# Patient Record
Sex: Male | Born: 1985
Health system: Southern US, Community
[De-identification: ages and names within clinical notes are randomized; demographics above are authoritative.]

## PROBLEM LIST (undated history)

## (undated) DIAGNOSIS — A039 Shigellosis, unspecified: Secondary | ICD-10-CM

## (undated) DIAGNOSIS — B20 Human immunodeficiency virus [HIV] disease: Secondary | ICD-10-CM

## (undated) DIAGNOSIS — I639 Cerebral infarction, unspecified: Secondary | ICD-10-CM

## (undated) DIAGNOSIS — A539 Syphilis, unspecified: Secondary | ICD-10-CM

## (undated) DIAGNOSIS — Z880 Allergy status to penicillin: Secondary | ICD-10-CM

## (undated) DIAGNOSIS — K219 Gastro-esophageal reflux disease without esophagitis: Secondary | ICD-10-CM

## (undated) DIAGNOSIS — J189 Pneumonia, unspecified organism: Secondary | ICD-10-CM

## (undated) DIAGNOSIS — M1990 Unspecified osteoarthritis, unspecified site: Secondary | ICD-10-CM

## (undated) DIAGNOSIS — Z1612 Extended spectrum beta lactamase (ESBL) resistance: Secondary | ICD-10-CM

## (undated) DIAGNOSIS — A499 Bacterial infection, unspecified: Secondary | ICD-10-CM

## (undated) DIAGNOSIS — B04 Monkeypox: Secondary | ICD-10-CM

## (undated) DIAGNOSIS — A072 Cryptosporidiosis: Secondary | ICD-10-CM

## (undated) DIAGNOSIS — Z8619 Personal history of other infectious and parasitic diseases: Secondary | ICD-10-CM

## (undated) DIAGNOSIS — K6282 Dysplasia of anus: Secondary | ICD-10-CM

## (undated) HISTORY — DX: Monkeypox: B04

## (undated) HISTORY — DX: Cryptosporidiosis: A07.2

## (undated) HISTORY — DX: Syphilis, unspecified: A53.9

## (undated) HISTORY — DX: Dysplasia of anus: K62.82

## (undated) HISTORY — DX: Shigellosis, unspecified: A03.9

## (undated) HISTORY — DX: Personal history of other infectious and parasitic diseases: Z86.19

## (undated) HISTORY — DX: Allergy status to penicillin: Z88.0

## (undated) HISTORY — DX: Pneumonia, unspecified organism: J18.9

---

## 2011-06-10 ENCOUNTER — Emergency Department: Payer: Self-pay | Admitting: Emergency Medicine

## 2011-07-15 ENCOUNTER — Emergency Department: Payer: Self-pay | Admitting: *Deleted

## 2011-09-27 ENCOUNTER — Inpatient Hospital Stay: Payer: Self-pay | Admitting: *Deleted

## 2011-09-27 LAB — COMPREHENSIVE METABOLIC PANEL
Albumin: 3.3 g/dL — ABNORMAL LOW (ref 3.4–5.0)
Anion Gap: 14 (ref 7–16)
BUN: 11 mg/dL (ref 7–18)
Calcium, Total: 8.6 mg/dL (ref 8.5–10.1)
Co2: 27 mmol/L (ref 21–32)
Creatinine: 0.84 mg/dL (ref 0.60–1.30)
EGFR (African American): >60
EGFR (Non-African Amer.): >60
Glucose: 84 mg/dL (ref 65–99)
Osmolality: 284 (ref 275–301)
Potassium: 3.4 mmol/L — ABNORMAL LOW (ref 3.5–5.1)
SGOT(AST): 32 U/L (ref 15–37)
SGPT (ALT): 20 U/L
Sodium: 143 mmol/L (ref 136–145)

## 2011-09-27 LAB — CBC WITH DIFFERENTIAL/PLATELET
Basophil #: 0 10*3/uL (ref 0.0–0.1)
Basophil %: 0.5 %
Eosinophil #: 0 10*3/uL (ref 0.0–0.7)
Eosinophil %: 0.9 %
HCT: 41.3 % (ref 40.0–52.0)
HCT: 37.9 % — ABNORMAL LOW (ref 40.0–52.0)
HGB: 13.5 g/dL (ref 13.0–18.0)
Lymphocyte #: 1.3 10*3/uL (ref 1.0–3.6)
MCH: 34.1 pg — ABNORMAL HIGH (ref 26.0–34.0)
MCH: 36.1 pg — ABNORMAL HIGH (ref 26.0–34.0)
MCHC: 34.5 g/dL (ref 32.0–36.0)
MCHC: 35.6 g/dL (ref 32.0–36.0)
MCV: 99 fL (ref 80–100)
MCV: 101 fL — ABNORMAL HIGH (ref 80–100)
Monocyte #: 0.4 10*3/uL (ref 0.0–0.7)
Monocyte #: 0.5 10*3/uL (ref 0.0–0.7)
Neutrophil #: 1.6 10*3/uL (ref 1.4–6.5)
Neutrophil #: 1.4 10*3/uL (ref 1.4–6.5)
Neutrophil %: 44 %
Platelet: 125 10*3/uL — ABNORMAL LOW (ref 150–440)
RBC: 4.18 10*6/uL — ABNORMAL LOW (ref 4.40–5.90)
RBC: 3.73 10*6/uL — ABNORMAL LOW (ref 4.40–5.90)
RDW: 13.8 % (ref 11.5–14.5)

## 2011-09-27 LAB — GLUCOSE, CSF: Glucose, CSF: 43 mg/dL (ref 40–75)

## 2011-09-27 LAB — CSF CELL COUNT WITH DIFFERENTIAL
CSF Tube #: 3
Eosinophil: 0 %
Lymphocytes: 96 %
Monocytes/Macrophages: 4 %
Neutrophils: 0 %
Other Cells: 0 %
WBC (CSF): 87 /mm3

## 2011-09-27 LAB — BASIC METABOLIC PANEL
Anion Gap: 9 (ref 7–16)
BUN: 12 mg/dL (ref 7–18)
Co2: 28 mmol/L (ref 21–32)
Creatinine: 1.02 mg/dL (ref 0.60–1.30)
EGFR (African American): >60
Glucose: 99 mg/dL (ref 65–99)
Osmolality: 281 (ref 275–301)
Potassium: 3.2 mmol/L — ABNORMAL LOW (ref 3.5–5.1)

## 2011-09-27 LAB — LIPASE, BLOOD: Lipase: 162 U/L (ref 73–393)

## 2011-09-27 LAB — PROTEIN, CSF: Protein, CSF: 94 mg/dL — ABNORMAL HIGH (ref 15–45)

## 2011-09-29 LAB — AEROBIC CULTURE

## 2011-10-02 LAB — CULTURE, BLOOD (SINGLE)

## 2012-07-03 DIAGNOSIS — Z880 Allergy status to penicillin: Secondary | ICD-10-CM

## 2012-07-03 HISTORY — DX: Allergy status to penicillin: Z88.0

## 2012-07-26 DIAGNOSIS — K6282 Dysplasia of anus: Secondary | ICD-10-CM

## 2012-07-26 HISTORY — DX: Dysplasia of anus: K62.82

## 2013-07-27 ENCOUNTER — Emergency Department: Payer: Self-pay | Admitting: Emergency Medicine

## 2013-07-27 LAB — URINALYSIS, COMPLETE
BACTERIA: NONE SEEN
BILIRUBIN, UR: NEGATIVE
Glucose,UR: NEGATIVE mg/dL (ref 0–75)
Leukocyte Esterase: NEGATIVE
NITRITE: NEGATIVE
PROTEIN: NEGATIVE
Ph: 6 (ref 4.5–8.0)
RBC,UR: 35 /HPF (ref 0–5)
Specific Gravity: 1.026 (ref 1.003–1.030)
Squamous Epithelial: 1
WBC UR: 3 /HPF (ref 0–5)

## 2013-12-03 ENCOUNTER — Telehealth: Payer: Self-pay

## 2013-12-03 NOTE — Telephone Encounter (Signed)
Call from FordsMichelle , NP with [facility] regarding assisting patient with HIV medications.   Patient will need to schedule intake and see financial counselor. Medical records have been received. Apportionment given for Dec 17, 2013 @ 9:00 a.m.  Laurell Josephsammy K Jayshon Dommer, RN

## 2013-12-08 ENCOUNTER — Encounter (HOSPITAL_COMMUNITY): Payer: Self-pay | Admitting: Emergency Medicine

## 2013-12-08 ENCOUNTER — Emergency Department (HOSPITAL_COMMUNITY)
Admission: EM | Admit: 2013-12-08 | Discharge: 2013-12-08 | Disposition: A | Discharge status: Home / Self Care | Payer: No Typology Code available for payment source | Attending: Emergency Medicine | Admitting: Emergency Medicine

## 2013-12-08 DIAGNOSIS — Z792 Long term (current) use of antibiotics: Secondary | ICD-10-CM | POA: Insufficient documentation

## 2013-12-08 DIAGNOSIS — K0889 Other specified disorders of teeth and supporting structures: Secondary | ICD-10-CM

## 2013-12-08 DIAGNOSIS — Z880 Allergy status to penicillin: Secondary | ICD-10-CM | POA: Insufficient documentation

## 2013-12-08 DIAGNOSIS — K044 Acute apical periodontitis of pulpal origin: Secondary | ICD-10-CM | POA: Insufficient documentation

## 2013-12-08 DIAGNOSIS — F17200 Nicotine dependence, unspecified, uncomplicated: Secondary | ICD-10-CM | POA: Insufficient documentation

## 2013-12-08 DIAGNOSIS — K089 Disorder of teeth and supporting structures, unspecified: Secondary | ICD-10-CM | POA: Insufficient documentation

## 2013-12-08 DIAGNOSIS — Z21 Asymptomatic human immunodeficiency virus [HIV] infection status: Secondary | ICD-10-CM | POA: Insufficient documentation

## 2013-12-08 DIAGNOSIS — K047 Periapical abscess without sinus: Secondary | ICD-10-CM

## 2013-12-08 DIAGNOSIS — K0510 Chronic gingivitis, plaque induced: Secondary | ICD-10-CM

## 2013-12-08 DIAGNOSIS — K0381 Cracked tooth: Secondary | ICD-10-CM | POA: Insufficient documentation

## 2013-12-08 HISTORY — DX: Human immunodeficiency virus (HIV) disease: B20

## 2013-12-08 MED ORDER — HYDROCODONE-ACETAMINOPHEN 5-325 MG PO TABS: 1.0000 | ORAL_TABLET | Freq: Four times a day (QID) | ORAL | Status: DC | PRN

## 2013-12-08 MED ORDER — IBUPROFEN 800 MG PO TABS
800.0000 mg | ORAL_TABLET | Freq: Once | ORAL | Status: AC
  Administered 2013-12-08: 800 mg via ORAL
  Filled 2013-12-08: qty 1

## 2013-12-08 MED ORDER — CLINDAMYCIN HCL 150 MG PO CAPS: ORAL_CAPSULE | ORAL | Status: DC

## 2013-12-08 MED ORDER — IBUPROFEN 800 MG PO TABS: 800.0000 mg | ORAL_TABLET | Freq: Three times a day (TID) | ORAL | Status: DC | PRN

## 2013-12-08 MED ORDER — HYDROCODONE-ACETAMINOPHEN 5-325 MG PO TABS
1.0000 | ORAL_TABLET | Freq: Once | ORAL | Status: AC
  Administered 2013-12-08: 1 via ORAL
  Filled 2013-12-08: qty 1

## 2013-12-08 NOTE — Discharge Instructions (Signed)
Return here as needed.  Followup with the dentist provided. rinse with warm water and peroxide 3 times a day

## 2013-12-08 NOTE — ED Provider Notes (Signed)
Medical screening examination/treatment/procedure(s) were performed by non-physician practitioner and as supervising physician I was immediately available for consultation/collaboration.   EKG Interpretation None       Sunnie NielsenBrian Maleia Weems, MD 12/08/13 2259

## 2013-12-08 NOTE — ED Provider Notes (Signed)
CSN: [id number]     Arrival date & time 12/08/13  0540 History   First MD Initiated Contact with Patient 12/08/13 [phone]     Chief Complaint  Patient presents with  . Dental Pain     (Consider location/radiation/quality/duration/timing/severity/associated sxs/prior Treatment) HPI Patient presents to the emergency department with gum and dental pain that has been ongoing for the last 2 weeks.  Patient, states, that he has not taken any medications prior to arrival.  Patient, states, that he feels, like he has swollen lymph nodes.  He states his left upper and lower teeth and gumline are irritated and inflamed.  The patient, states, that he has a cracked tooth in the left upper dentition.  Patient denies fevers, neck pain, neck swelling, shortness of breath, nausea, vomiting, diarrhea, weakness, dizziness, or syncope.  The patient, states, that symptoms have been persistent.  He states nothing seems make his condition, better or worse Past Medical History  Diagnosis Date  . HIV disease    History reviewed. No pertinent past surgical history. No family history on file. History  Substance Use Topics  . Smoking status: Current Every Day Smoker -- 1.00 packs/day  . Smokeless tobacco: Never Used  . Alcohol Use: Yes     Comment: whiskey occasionally     Review of Systems  All other systems negative except as documented in the HPI. All pertinent positives and negatives as reviewed in the HPI.  Allergies  Amoxicillin  Home Medications   Prior to Admission medications   Medication Sig Start Date End Date Taking? Authorizing Provider  ibuprofen (ADVIL,MOTRIN) 800 MG tablet Take 800 mg by mouth every 8 (eight) hours as needed for moderate pain.   Yes Historical Provider, MD  sulfamethoxazole-trimethoprim (BACTRIM DS,SEPTRA DS) 800-160 MG per tablet Take 0.5 tablets by mouth daily.   Yes Historical Provider, MD   BP 128/77  Pulse 68  Temp(Src) 98.2 F (36.8 C) (Oral)  Resp 20  SpO2  98% Physical Exam  Nursing note and vitals reviewed. Constitutional: He is oriented to person, place, and time. He appears well-developed and well-nourished.  HENT:  Mouth/Throat: Oropharynx is clear and moist and mucous membranes are normal. He does not have dentures. No oral lesions. No trismus in the jaw. Abnormal dentition. Dental caries present. No dental abscesses or uvula swelling.    Cardiovascular: Normal rate, regular rhythm and normal heart sounds.  Exam reveals no gallop and no friction rub.   No murmur heard. Pulmonary/Chest: Effort normal and breath sounds normal.  Neurological: He is alert and oriented to person, place, and time.  Skin: Skin is warm and dry. No erythema.    ED Course  Procedures (including critical care time)   Patient be referred to the on-call dentist.  Told to return here as needed.  Advised to followup with his primary care Dr. told to rinse with warm water and peroxide 3 times a day  Carlyle DollyChristopher W Makaya Juneau, PA-C 12/08/13 0700

## 2013-12-08 NOTE — ED Notes (Signed)
Pt states for the last week or so he has been experiencing pain and tenderness to his gum line. Pt has several broken teeth in mouth. Pt has been using oragel. Pt has a Rx for 800 mg ibuprofen and bactrim from clinic.

## 2013-12-17 ENCOUNTER — Ambulatory Visit (INDEPENDENT_AMBULATORY_CARE_PROVIDER_SITE_OTHER): Payer: Self-pay

## 2013-12-17 DIAGNOSIS — B20 Human immunodeficiency virus [HIV] disease: Secondary | ICD-10-CM

## 2013-12-17 DIAGNOSIS — Z23 Encounter for immunization: Secondary | ICD-10-CM

## 2013-12-18 LAB — CBC WITH DIFFERENTIAL/PLATELET
Basophils Absolute: 0 10*3/uL (ref 0.0–0.1)
Basophils Relative: 0 % (ref 0–1)
EOS PCT: 2 % (ref 0–5)
Eosinophils Absolute: 0.1 10*3/uL (ref 0.0–0.7)
HEMATOCRIT: 37.2 % — AB (ref 39.0–52.0)
HEMOGLOBIN: 13.1 g/dL (ref 13.0–17.0)
Lymphocytes Relative: 35 % (ref 12–46)
Lymphs Abs: 0.9 10*3/uL (ref 0.7–4.0)
MCH: 33.2 pg (ref 26.0–34.0)
MCHC: 35.2 g/dL (ref 30.0–36.0)
MCV: 94.4 fL (ref 78.0–100.0)
MONOS PCT: 15 % — AB (ref 3–12)
Monocytes Absolute: 0.4 10*3/uL (ref 0.1–1.0)
NEUTROS ABS: 1.3 10*3/uL — AB (ref 1.7–7.7)
Neutrophils Relative %: 48 % (ref 43–77)
Platelets: 161 10*3/uL (ref 150–400)
RBC: 3.94 MIL/uL — ABNORMAL LOW (ref 4.22–5.81)
RDW: 13.8 % (ref 11.5–15.5)
WBC: 2.7 10*3/uL — ABNORMAL LOW (ref 4.0–10.5)

## 2013-12-18 LAB — COMPLETE METABOLIC PANEL WITH GFR
ALBUMIN: 4 g/dL (ref 3.5–5.2)
ALT: 41 U/L (ref 0–53)
AST: 56 U/L — ABNORMAL HIGH (ref 0–37)
Alkaline Phosphatase: 67 U/L (ref 39–117)
BUN: 18 mg/dL (ref 6–23)
CALCIUM: 8.7 mg/dL (ref 8.4–10.5)
CHLORIDE: 105 meq/L (ref 96–112)
CO2: 28 mEq/L (ref 19–32)
CREATININE: 0.66 mg/dL (ref 0.50–1.35)
GFR, Est African American: >89 mL/min
GFR, Est Non African American: >89 mL/min
GLUCOSE: 84 mg/dL (ref 70–99)
POTASSIUM: 3.9 meq/L (ref 3.5–5.3)
Sodium: 141 mEq/L (ref 135–145)
Total Bilirubin: 0.4 mg/dL (ref 0.2–1.2)
Total Protein: 7.2 g/dL (ref 6.0–8.3)

## 2013-12-18 LAB — T-HELPER CELL (CD4) - (RCID CLINIC ONLY)
CD4 % Helper T Cell: 8 % — ABNORMAL LOW (ref 33–55)
CD4 T Cell Abs: 200 /uL — ABNORMAL LOW (ref 400–2700)

## 2013-12-18 LAB — RPR: RPR: REACTIVE — AB

## 2013-12-18 LAB — HEPATITIS B CORE ANTIBODY, TOTAL: Hep B Core Total Ab: REACTIVE — AB

## 2013-12-18 LAB — URINALYSIS
BILIRUBIN URINE: NEGATIVE
GLUCOSE, UA: NEGATIVE mg/dL
Hgb urine dipstick: NEGATIVE
Ketones, ur: NEGATIVE mg/dL
Leukocytes, UA: NEGATIVE
Nitrite: NEGATIVE
Protein, ur: 30 mg/dL — AB
Urobilinogen, UA: 1 mg/dL (ref 0.0–1.0)
pH: 6 (ref 5.0–8.0)

## 2013-12-18 LAB — LIPID PANEL
CHOL/HDL RATIO: 2.7 ratio
CHOLESTEROL: 126 mg/dL (ref 0–200)
HDL: 47 mg/dL (ref >39)
LDL Cholesterol: 49 mg/dL (ref 0–99)
TRIGLYCERIDES: 152 mg/dL — AB (ref <150)
VLDL: 30 mg/dL (ref 0–40)

## 2013-12-18 LAB — HIV-1 RNA ULTRAQUANT REFLEX TO GENTYP+
HIV 1 RNA Quant: 1058016 copies/mL — ABNORMAL HIGH (ref <20)
HIV-1 RNA Quant, Log: 6.02 {Log} — ABNORMAL HIGH (ref <1.30)

## 2013-12-18 LAB — HEPATITIS B SURFACE ANTIBODY,QUALITATIVE

## 2013-12-18 LAB — HEPATITIS B SURFACE ANTIGEN: Hepatitis B Surface Ag: NEGATIVE

## 2013-12-18 LAB — T.PALLIDUM AB, TOTAL

## 2013-12-18 LAB — HEPATITIS A ANTIBODY, TOTAL: Hep A Total Ab: NONREACTIVE

## 2013-12-18 LAB — HEPATITIS C ANTIBODY: HCV Ab: NEGATIVE

## 2013-12-18 LAB — RPR TITER: RPR Titer: 1:2 {titer}

## 2013-12-23 NOTE — Progress Notes (Signed)
Patient recently transferred from [facility]. Diagnosed in 2012. He has been without Stribild, Prezista and Bactrim for at least one month while looking for resources.  He is currently living at a homeless shelter.  He is a bisexual single male  with preference for male sexual partners and versatile for insertive and receptive sex. Currently he has no support and has refused counseling or group support offered.  Past history of cocaine and alcohol abuse.   He is currently being treated with antibiotics for a infected tooth. He attends  [facility] for primary care.  Three tattoos performed at a tattoo party with bilateral ear piercings.  Medical records received. Vaccines updated  Laurell Josephs, RN

## 2013-12-24 LAB — HLA B*5701: HLA-B*5701 w/rflx HLA-B High: NEGATIVE

## 2013-12-28 LAB — HIV-1 GENOTYPR PLUS

## 2014-01-01 ENCOUNTER — Ambulatory Visit (INDEPENDENT_AMBULATORY_CARE_PROVIDER_SITE_OTHER): Payer: No Typology Code available for payment source | Admitting: Internal Medicine

## 2014-01-01 ENCOUNTER — Encounter: Payer: Self-pay | Admitting: Internal Medicine

## 2014-01-01 VITALS — BP 115/72 | HR 72 | Temp 98.5°F | Ht 73.0 in | Wt 204.0 lb

## 2014-01-01 DIAGNOSIS — B20 Human immunodeficiency virus [HIV] disease: Secondary | ICD-10-CM | POA: Insufficient documentation

## 2014-01-01 DIAGNOSIS — Z202 Contact with and (suspected) exposure to infections with a predominantly sexual mode of transmission: Secondary | ICD-10-CM

## 2014-01-01 DIAGNOSIS — K6282 Dysplasia of anus: Secondary | ICD-10-CM

## 2014-01-01 DIAGNOSIS — F17200 Nicotine dependence, unspecified, uncomplicated: Secondary | ICD-10-CM

## 2014-01-01 MED ORDER — SULFAMETHOXAZOLE-TMP DS 800-160 MG PO TABS
1.0000 | ORAL_TABLET | Freq: Every day | ORAL | Status: DC
Start: 2014-01-01 — End: 2014-02-06

## 2014-01-01 MED ORDER — ELVITEG-COBIC-EMTRICIT-TENOFDF 150-150-200-300 MG PO TABS: 1.0000 | ORAL_TABLET | Freq: Every day | ORAL | Status: DC

## 2014-01-01 MED ORDER — DARUNAVIR ETHANOLATE 800 MG PO TABS: 800.0000 mg | ORAL_TABLET | Freq: Every day | ORAL | Status: DC

## 2014-01-02 ENCOUNTER — Encounter: Payer: Self-pay | Admitting: Internal Medicine

## 2014-01-02 DIAGNOSIS — K6282 Dysplasia of anus: Secondary | ICD-10-CM | POA: Insufficient documentation

## 2014-01-02 DIAGNOSIS — F17200 Nicotine dependence, unspecified, uncomplicated: Secondary | ICD-10-CM | POA: Insufficient documentation

## 2014-01-02 DIAGNOSIS — Z202 Contact with and (suspected) exposure to infections with a predominantly sexual mode of transmission: Secondary | ICD-10-CM | POA: Insufficient documentation

## 2014-01-02 NOTE — Assessment & Plan Note (Signed)
Will send for anal pap.

## 2014-01-02 NOTE — Assessment & Plan Note (Signed)
Will restart Stribild and Prezista.  Counseled on compliance and hope that no new resistance developed.  Genotype here with 103N, previous 184 archived.     Will have him return in about 3 weeks to see if he is on meds. Will start Bactrim for CD4 of 200.  If goes up next time will stop.  Will need Hep A once CD4 remains over 200.

## 2014-01-02 NOTE — Progress Notes (Addendum)
   Subjective:    Patient ID: Jacob Gutierrez, male    DOB: 09-07-1985, 28 y.o.   MRN: [id number]  HPI Here to establish care for HIV.  Diagnosed in 2013, CD4 nadir of 60.  History of syphilis and treated for neurosyphilis in 2013.  History of gonorrhea, pulmonary coccidio. Was started on Atripla in 2013, had poor adherence and developed 103n and 184V resistance mutations.  ARVs stopped due to lack of commitment to compliance.  Then started in 2014 on Stribild with Prezista and was taking daily for about 6 months.  Relocated here from [facility] and continued to get medicines by mail until he didn't follow up there.  Once he was out of medications he presented here to establish care.  Has been off since April.  No new issues.  Interested in theapy and feels he is in a good position to take daily.  No weight loss.  Had a period of diarrhea for about 2 weeks that resolved.   1 hour late for the appt.    Review of Systems  Constitutional: Negative for fever, activity change, appetite change, fatigue and unexpected weight change.  HENT: Negative for trouble swallowing.   Eyes: Negative for visual disturbance.  Respiratory: Negative for cough and shortness of breath.   Gastrointestinal: Negative for nausea and diarrhea.  Skin: Negative for rash.  Neurological: Negative for dizziness, light-headedness and headaches.  Hematological: Negative for adenopathy.  Psychiatric/Behavioral: Negative for sleep disturbance.       Objective:   Physical Exam  Constitutional: He appears well-developed and well-nourished. No distress.  HENT:  Mouth/Throat: No oropharyngeal exudate.  Eyes: Right eye exhibits no discharge. Left eye exhibits no discharge. No scleral icterus.  Cardiovascular: Normal rate, regular rhythm and normal heart sounds.   No murmur heard. Pulmonary/Chest: Effort normal and breath sounds normal. No respiratory distress. He has no wheezes.  Abdominal: Soft. Bowel sounds are normal. He  exhibits no distension. There is no tenderness.  Musculoskeletal: He exhibits no edema.  Lymphadenopathy:    He has no cervical adenopathy.  Skin: Skin is warm and dry. No rash noted.          Assessment & Plan:

## 2014-01-03 ENCOUNTER — Other Ambulatory Visit: Payer: Self-pay | Admitting: Licensed Clinical Social Worker

## 2014-01-03 MED ORDER — ELVITEG-COBIC-EMTRICIT-TENOFDF 150-150-200-300 MG PO TABS: 1.0000 | ORAL_TABLET | Freq: Every day | ORAL | Status: DC

## 2014-01-22 ENCOUNTER — Ambulatory Visit: Payer: No Typology Code available for payment source | Admitting: Internal Medicine

## 2014-02-06 ENCOUNTER — Other Ambulatory Visit: Payer: Self-pay | Admitting: *Deleted

## 2014-02-06 MED ORDER — SULFAMETHOXAZOLE-TMP DS 800-160 MG PO TABS: 1.0000 | ORAL_TABLET | Freq: Every day | ORAL | Status: DC

## 2014-02-06 MED ORDER — DARUNAVIR ETHANOLATE 800 MG PO TABS: 800.0000 mg | ORAL_TABLET | Freq: Every day | ORAL | Status: DC

## 2014-02-06 MED ORDER — ELVITEG-COBIC-EMTRICIT-TENOFDF 150-150-200-300 MG PO TABS: 1.0000 | ORAL_TABLET | Freq: Every day | ORAL | Status: DC

## 2014-02-25 ENCOUNTER — Ambulatory Visit (INDEPENDENT_AMBULATORY_CARE_PROVIDER_SITE_OTHER): Payer: No Typology Code available for payment source | Admitting: Internal Medicine

## 2014-02-25 ENCOUNTER — Encounter: Payer: Self-pay | Admitting: Internal Medicine

## 2014-02-25 VITALS — BP 116/76 | HR 90 | Temp 98.1°F | Ht 73.0 in | Wt 205.0 lb

## 2014-02-25 DIAGNOSIS — B20 Human immunodeficiency virus [HIV] disease: Secondary | ICD-10-CM

## 2014-02-25 DIAGNOSIS — Z23 Encounter for immunization: Secondary | ICD-10-CM

## 2014-02-25 NOTE — Addendum Note (Signed)
Addended by: Wendall MolaOCKERHAM, JACQUELINE A on: 02/25/2014 03:11 PM   Modules accepted: Orders

## 2014-02-25 NOTE — Progress Notes (Signed)
   Subjective:    Patient ID: Shepard GeneralDesmond Gutierrez, male    DOB: 04-12-1986, 28 y.o.   MRN: [id number]  HPI  Here to establish care for HIV.  Diagnosed in 2013, CD4 nadir of 60.  History of syphilis and treated for neurosyphilis in 2013.  History of gonorrhea, pulmonary coccidio. Was started on Atripla in 2013, had poor adherence and developed 103n and 184V resistance mutations.  ARVs stopped due to lack of commitment to compliance.  Then started in 2014 on Stribild with Prezista and was taking daily for about 6 months.  [place] [facility] and continued to get medicines by mail until he didn't follow up there.  Once he was out of medications he presented here to establish care.  Had been off since April.  No new issues.  Started back on Stribild and Prezista after establishing with me last month.  Has been on it about 2 weeks.  No new issues, no n/v/d.  No weight loss.    Review of Systems  Constitutional: Negative for fever, activity change, appetite change, fatigue and unexpected weight change.  HENT: Negative for trouble swallowing.   Gastrointestinal: Negative for nausea and diarrhea.  Skin: Negative for rash.  Neurological: Negative for dizziness.  Hematological: Negative for adenopathy.       Objective:   Physical Exam  Constitutional: He appears well-developed and well-nourished. No distress.  HENT:  Mouth/Throat: No oropharyngeal exudate.  Eyes: No scleral icterus.  Cardiovascular: Normal rate, regular rhythm and normal heart sounds.   No murmur heard. Pulmonary/Chest: Effort normal and breath sounds normal. No respiratory distress. He has no wheezes.  Lymphadenopathy:    He has no cervical adenopathy.  Skin: Skin is warm and dry. No rash noted.          Assessment & Plan:

## 2014-02-25 NOTE — Assessment & Plan Note (Signed)
Doing well back on his regimen.  Will check labs in about 2 weeks and hopefully becoming suppressed.   Hep A vaccine today, will continue with Bacttrim for now but if remains 200 or above with next labs will stop.   RTC 2 weeks after labs.

## 2014-03-11 ENCOUNTER — Other Ambulatory Visit: Payer: No Typology Code available for payment source

## 2014-04-01 ENCOUNTER — Ambulatory Visit: Payer: No Typology Code available for payment source | Admitting: Internal Medicine

## 2014-04-21 ENCOUNTER — Telehealth: Payer: Self-pay | Admitting: *Deleted

## 2014-04-21 NOTE — Telephone Encounter (Signed)
Received signed ROI requesting last labs and office note from Kerrin Mo at [facility].  Patient has transferred care. Faxed requested information as requested.  Notified Conception Oms.  Andree Coss, RN

## 2014-11-16 NOTE — Discharge Summary (Signed)
PATIENT NAME:  Jacob Gutierrez, Jacob Gutierrez MR#:  [id number] DATE OF BIRTH:  Apr 30, 1986  DATE OF ADMISSION:  09/27/2011 DATE OF DISCHARGE:  09/28/2011  DISCHARGE DIAGNOSES:  1. Aseptic meningitis.  2. History of HIV positive. 3. Latent syphilis.  CONSULT: Infectious Disease, Dr. Leavy CellaBlocker    HOSPITAL COURSE: This is a 29 year old male with history of HIV positive, not on any antiretroviral therapy. He presented with headache and vomiting. He was recently started on doxycycline for latent syphilis by the Health Department. He had a lumbar puncture done in the Emergency Room and his CSF showed about 87 WBCs, 30 RBCs, no neutrophils, 96% lymphocytes, and 4 monocytes consistent with aseptic meningitis. His glucose was 43, protein was elevated at 94 mg/dL. His CSF Gram stain and culture were negative. Blood cultures were negative. When he came in, his white count was 3.3, hemoglobin was 14.3. His white count remained stable in the range of 3.2 to 3.3. His creatinine was normal at 0.8. He was slightly hypokalemic. That was replaced. His LFTs were normal. His lipase was normal. He was admitted as aseptic meningitis. Infectious Disease was consulted. He was continued on doxycycline for his latent syphilis. Dr. Leavy CellaBlocker saw him and said that his lumbar puncture was consistent with aseptic meningitis. Most likely this is viral. Because he was being treated for latent syphilis, he also wanted to rule out any neurosyphilis so a CSF VDRL was added to the CSF fluid and it came back as not reactive today. [facility] and he's going to establish with the ID Clinic there. [facility] so he does not need any opportunistic infection prophylaxis. He received IV hydration here, Tylenol and Motrin p.r.n. for headache.. Continue doxycycline as prescribed by the Health Department and acetaminophen and Tylenol as needed for headache.   CONDITION AT DISCHARGE: Stable. His headache, neck stiffness has  almost resolved. No photophobia. No nausea or vomiting. He is tolerating a regular diet. T-max 98.3, heart rate 98, blood pressure 137/90, saturating 98% on room air. Chest is clear. Heart sounds are regular. Abdomen soft, nontender. Neck is supple.   FOLLOW-UP: The patient should follow-up with [facility].  TIME SPENT WITH DISCHARGE: 35 minutes.   ____________________________ Fredia SorrowAbhinav Lyssa Hackley, MD ag:drc D: 09/28/2011 17:39:43 ET T: 09/29/2011 10:10:24 ET JOB#: [id number]  cc: Fredia SorrowAbhinav Azion Centrella, MD, <Dictator> [facility]  Fredia SorrowABHINAV Kaikoa Magro MD ELECTRONICALLY SIGNED 09/29/2011 16:58

## 2014-11-16 NOTE — H&P (Signed)
PATIENT NAME:  Jacob Gutierrez, Jacob Gutierrez DATE OF BIRTH:  [birth year]  DATE OF ADMISSION:  09/27/2011  PRIMARY CARE PHYSICIAN: None.   CHIEF COMPLAINT: Headache and vomiting today.   HISTORY OF PRESENT ILLNESS: Jacob Gutierrez is a 29 year old African American gentleman with a past medical history of HIV which was diagnosed a couple of years ago, not on any medication, who comes to the Emergency Room with headache, which started around 5:00 p.m. yesterday along with 1 or 2 episodes of vomiting. The patient came to the Emergency Room, underwent lumbar puncture which showed findings suggestive of possible aseptic meningitis. Given his history of HIV, he received IV vancomycin and acyclovir given per Emergency Room physician. He is being admitted for further evaluation and management. The patient during my evaluation he states his headache is better. He denies any blurred vision, double vision, any focal weakness, or any more vomiting. He was eating crackers during my evaluation. Denies any fever or any weight loss.   The patient was recently tested positive for syphilis through the health department. He has been on Doxycycline (which is prescription) and he is supposed to be taking it for 28 days. The patient does not remember the exact dose for his doxycycline. He is currently not on any HIV medications. He is [place]   PAST MEDICAL HISTORY:    1. HIV-positive.  2. Positive test for syphilis, currently on doxycycline. He is supposed to take it for 28 days. This was given through the health department.   FAMILY HISTORY: No family history of HIV.   ALLERGIES: Amoxicillin.   SOCIAL HISTORY: Lives by himself. He is currently unemployed. He lost his job. He smokes about a pack a day. He does "smoke weed" occasionally. Denies much alcohol use.   MEDICATIONS: None.   REVIEW OF SYSTEMS: CONSTITUTIONAL: No fever, fatigue,  weakness. EYES: No blurred or double vision. ENT: No tinnitus or ear pain. Positive for headache. RESPIRATORY: No cough, wheeze, hemoptysis. CARDIOVASCULAR: No chest pain, orthopnea, or edema. GASTROINTESTINAL: No nausea, vomiting, diarrhea, or abdominal pain. GU: No dysuria or hematuria. ENDOCRINE: No polyuria or nocturia. HEMATOLOGY: No anemia or easy bruising. SKIN: No acne or rash. MUSCULOSKELETAL: No arthritis. NEUROLOGIC: No cerebrovascular accident or transient ischemic attack. PSYCH: No anxiety or depression. All other systems reviewed and negative.   PHYSICAL EXAMINATION:  GENERAL: The patient is awake, alert, and oriented x3, not in acute distress.   VITAL SIGNS: Afebrile, pulse 48, blood pressure 133/76, pulse oximetry is 98% on room air.   HEENT: Atraumatic, normocephalic. Pupils are equal, round, and reactive to light and accommodation. Extraocular movements intact. Oral mucosa is moist.   NECK: Supple. No JVD. No carotid bruit.   LUNGS: Clear to auscultation bilaterally. No rales, rhonchi, respiratory distress, or labored breathing.   HEART: Both heart sounds are normal. Rate, rhythm is regular. PMI is not lateralized. Chest nontender.   EXTREMITIES: Good pedal pulses, good femoral pulses. No lower extremity edema.   ABDOMEN: Soft, benign, and nontender. No organomegaly. Positive bowel sounds.   NEUROLOGIC: Grossly intact cranial nerves 2 through 12. No motor or sensory deficits.   PSYCH: The patient is awake, alert, and oriented x3.   LABORATORY, RADIOLOGICAL AND DIAGNOSTIC DATA: Protein CSF 94. Glucose 43. No organism seen on Gram stain. 87 WBCs, 96% lymphocytes. White count 3.3, hemoglobin and hematocrit is 14.3 and 41, platelet count 125. Comprehensive metabolic panel within normal limits except  potassium of 3.4.   ASSESSMENT AND PLAN: 29 year old Jacob Gutierrez with:  1. Aseptic/viral meningitis.  2. Headache, nausea, and vomiting due to #1.  3. HIV positive with unknown  CD4 count.  4. Positive for syphilis, currently being treated through the health department with doxycycline.   PLAN:  1. Admit the patient for observation.  2. Regular diet.  3. IV fluids.  4. We will give symptomatic treatment with p.o. ibuprofen versus Tylenol around-the-clock.  5. I will give the patient doxycycline 100 mg b.i.d. for his positive test on syphilis. The patient does not remember the exact dose, but I will start with 100 mg b.i.d.  6. Infectious disease consultation with Dr. Leavy Cella for need of further empiric antibiotic treatment for aseptic meningitis given history of HIV. I will hold off on giving any further antibiotics at present. He already received acyclovir and vancomycin in the Emergency Room.  7. Further work-up according to the patient's clinical course. The hospital admission plan was discussed with the patient who is agreeable to it.   TIME SPENT: 50 minutes.  ____________________________ Jacob Hail Allena Katz, MD sap:ap D: 09/27/2011 05:18:40 ET T: 09/27/2011 08:29:39 ET JOB#: [id number]  cc: Islam Villescas A. Allena Katz, MD, <Dictator> Willow Ora MD ELECTRONICALLY SIGNED 09/28/2011 6:28

## 2014-11-16 NOTE — Consult Note (Signed)
Impression: 29yo BM w/ h/o HIV infection with unknown CD4 count >200 and, syphilis admitted with aseptic meningitis.  His lumbar puncture is consistent with aseptic meningitis.  While most of the etiologies are viral, other possibilities exist.  He is HIV positive with a recent diagnosis of syphilis.  He is being treated for latent syphilis with doxycycline, but in HIV patients there is a higher possibility of neurosyphilis.  Other etiologies include viruses including HSV.  There is no need for acyclovir as HSV meningitis in the abscence of encephalitis resolves spontaneously. Will add CSF VDRL to his spinal fluid. He has a PCN allergy.  If his VDRL is positive, he will need to be desensitized to PCN and started on IV penicillin.  His allergy was as an infant and he does not know the extent of the reaction.  Ceftriaxone would be an alternative, but I would be concerned about cross reaction with penicillin and that this would not be as well documented a therapy as PCN. No need for isolation. [facility] and has an appointment in the ID clinic there to establish HIV care.  Would have him follow up there upon discharge. Continue the doxycycline for now. Given his recent CD4 count he does not need OI prophylaxis.   Electronic Signatures: Shawnya Mayor, Rosalyn GessMichael E (MD) (Signed on 05-Mar-13 12:43)  Authored   Last Updated: 05-Mar-13 14:57 by Kaoru Benda, Rosalyn GessMichael E (MD)

## 2014-11-16 NOTE — Consult Note (Signed)
PATIENT NAME:  Jacob, Gutierrez Jacob Gutierrez DATE OF BIRTH:  1985/08/26  DATE OF CONSULTATION:  09/27/2011  REFERRING PHYSICIAN:  Alford Highland, MD  CONSULTING PHYSICIAN:  Rosalyn Gess. Kenai Fluegel, MD  REASON FOR CONSULTATION: Aseptic meningitis in an HIV patient.   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 The patient states he was in his usual state of health until he began having severe headaches that involved his entire head. His pain was worse with moving his head. He had problems with photophobia as well. He denies any fevers, chills, or sweats. He has not had any rash or confusion. He did have some nausea and vomiting associated with this. He was brought into the Emergency Room and a lumbar puncture was performed which demonstrated increased leukocytes, most which were lymphocytes. He was given a dose of vancomycin and acyclovir but has not received any further antibiotic therapy for this problem. He has been on doxycycline for a diagnosis of syphilis. He was initially diagnosed in December but did not receive word until approximately two weeks ago. He was [facility] the [facility], and due to his penicillin allergy he was started on doxycycline. He is apparently being treated for latent infection. No lumbar puncture was performed with the diagnosis of syphilis. The patient states that he got tested just for routine sexually transmitted disease check. He had been diagnosed with HIV several years ago. He has not been treated for this. He was seen initially on the day prior to admission at [facility] and had blood drawn. He has an appointment at the beginning of next month in the ID clinic there. He denies any fevers, chills, or sweats.  He has had some neck stiffness  and headache. These are somewhat improved since being in the hospital. His nausea and vomiting have somewhat improved as well. He had no change in his bowels. He has had no genital ulcerations. He denies any swollen glands.   ALLERGIES: Penicillin. He was told he had a penicillin allergy as a child but does not recall what the reaction was.   PAST MEDICAL HISTORY:  1. HIV positive. He states he has been tested several times in the past and has had intermediate results in the past but more recently has been told he is positive. He has never been on therapy for this. He does not know his CD4 count nor his viral load. He had been seen at [facility] the day prior to admission and blood work showed a white count of 3.1, hematocrit of 0.39, platelet count 128, ALC 1.6. A CD4 count was 289. His RPR was positive at 1 to 2.  2. Syphilis. The patient is currently about half way through a 28-day course of doxycycline. He had an RPR the day prior to admission of 1 to 2. It is unclear what his RPR was on initial diagnosis in December.   FAMILY HISTORY: No diabetes or cancers in the family.   SOCIAL HISTORY: The patient lives by himself. He smokes about a pack of cigarettes per day. He smokes marijuana. Very rare alcohol use. No pets at home. No injecting drug use history. He has no history of blood transfusions. He has two tattoos that are old. He is sexually active with men.  CURRENT ANTIBIOTICS: Doxycycline orally.  REVIEW OF SYSTEMS: GENERAL: No fevers, chills, or sweats. Some malaise. HEENT: Positive headaches. Negative sinus congestion, nasal congestion. No sore throat. NECK: Positive stiffness. Positive photophobia. No swollen glands. RESPIRATORY: No cough or shortness of breath. No sputum production. CARDIAC: No chest pains or palpitations. GI:  Positive nausea and vomiting. No abdominal pain, no change in his bowels. GENITOURINARY: No change in his urine. MUSCULOSKELETAL: No complaints. SKIN: No rashes. No  ulcerations. No groin lesions. NEUROLOGIC: No focal weakness. He does have neck stiffness as described above.  PSYCHIATRIC: No complaints.   PHYSICAL EXAMINATION:  VITAL SIGNS: T-max 98.1, pulse 57, blood pressure 125/80, 98% on room air.   GENERAL: 29 year old black man in no acute distress.   HEENT: Normocephalic, atraumatic. Pupils equal, reactive to light. Extraocular motion intact. Sclerae, conjunctivae, and lids are without evidence for emboli or petechiae. Oropharynx shows no erythema or exudate. Teeth and gums are in fair condition.   NECK: Supple. Full range of motion, although he had some minimal discomfort with full forward flexion. No lymphadenopathy. No thyromegaly. Midline trachea.    CHEST: Clear to auscultation bilaterally with good air movement. No focal consolidation.   HEART: Regular rate and rhythm without murmur, rub, or gallop.   ABDOMEN: Soft, nontender, and nondistended. No hepatosplenomegaly. No hernias noted.   EXTREMITIES: No evidence for tenosynovitis.   SKIN: No rashes. No stigmata of endocarditis, specifically no Janeway lesions or Osler nodes.   NEUROLOGIC: The patient is awake and interactive, moving all four extremities.   PSYCHIATRIC: Mood and affect appeared normal.   LABORATORY, DIAGNOSTIC, AND RADIOLOGICAL DATA: BUN 12, creatinine 1.02. LFTs within normal limits. White count of 3.2, hemoglobin 13.5, platelet count 127, ANC 1.4, lumbar puncture showed 30 red cells and 87 white cells per high-power field, 96% were lymphocytes. Glucose was 43 and protein was 94. A Gram stain showed no organisms. No white blood cells and no growth at 8 to 12 hours. A CT scan of the head without contrast showed no acute intracranial process.   IMPRESSION: This is a 29 year old white man with a history of HIV infection with a CD4 count greater than 200 and syphilis who was admitted with aseptic meningitis.   RECOMMENDATIONS:  1. His lumbar puncture is consistent with  aseptic meningitis. While  most of the etiologies are viral, other possibilities exist. He is HIV positive with a recent diagnosis of syphilis. 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 This is typically associated with high RPRs and low CD4 counts, and his RPR was actually fairly low. Other etiologies include viruses such as HSV. There is no need for acyclovir as HSV meningitis in the absence of encephalitis resolves spontaneously.  2. We will add CSF, VDRL to his spinal fluid.  3. He has a penicillin allergy.  If his VDRL is positive he will need to be desensitized to penicillin and started on IV penicillin. His allergy was as an infant and he does not know the extent of the reaction. Ceftriaxone would be an alternative, but I would be concerned about cross-reaction with the penicillin and this would not be as well documented a therapy as penicillin, especially given his HIV status.  4. There is no need for isolation.  5. He was recently seen at [facility] and has an appointment in the ID clinic there to establish HIV care. Would have him follow up there upon discharge.  6. Would continue doxycycline for  now.  7. Given his recent CD4 count he does not need OI prophylaxis.   This is a high-level infectious disease consult. Thank you very much for involving me in Jacob Gutierrez's care.    ____________________________ Rosalyn GessMichael E. Yareni Creps, MD [id number] D: 09/27/2011 14:57:07 ET T: 09/27/2011 16:30:51 ET JOB#: [id number]  cc: Rosalyn GessMichael E. Chris Narasimhan, MD, <Dictator> Zakiya Sporrer E Caster Fayette MD ELECTRONICALLY SIGNED 10/04/2011 10:09

## 2016-09-12 DIAGNOSIS — A539 Syphilis, unspecified: Secondary | ICD-10-CM

## 2016-09-12 HISTORY — DX: Syphilis, unspecified: A53.9

## 2016-11-02 DIAGNOSIS — A071 Giardiasis [lambliasis]: Secondary | ICD-10-CM | POA: Insufficient documentation

## 2017-03-05 ENCOUNTER — Emergency Department (HOSPITAL_COMMUNITY): Payer: Medicaid Other

## 2017-03-05 ENCOUNTER — Encounter (HOSPITAL_COMMUNITY): Payer: Self-pay | Admitting: Emergency Medicine

## 2017-03-05 ENCOUNTER — Inpatient Hospital Stay (HOSPITAL_COMMUNITY)
Admission: EM | Admit: 2017-03-05 | Discharge: 2017-03-08 | DRG: 371 | Disposition: A | Discharge status: Home / Self Care | Payer: Medicaid Other | Attending: Family Medicine | Admitting: Family Medicine

## 2017-03-05 DIAGNOSIS — Z880 Allergy status to penicillin: Secondary | ICD-10-CM

## 2017-03-05 DIAGNOSIS — Z5900 Homelessness unspecified: Secondary | ICD-10-CM | POA: Insufficient documentation

## 2017-03-05 DIAGNOSIS — R634 Abnormal weight loss: Secondary | ICD-10-CM | POA: Diagnosis not present

## 2017-03-05 DIAGNOSIS — R509 Fever, unspecified: Secondary | ICD-10-CM | POA: Diagnosis not present

## 2017-03-05 DIAGNOSIS — A0819 Acute gastroenteropathy due to other small round viruses: Secondary | ICD-10-CM | POA: Diagnosis present

## 2017-03-05 DIAGNOSIS — K6289 Other specified diseases of anus and rectum: Secondary | ICD-10-CM | POA: Diagnosis present

## 2017-03-05 DIAGNOSIS — R627 Adult failure to thrive: Secondary | ICD-10-CM | POA: Diagnosis present

## 2017-03-05 DIAGNOSIS — G0490 Encephalitis and encephalomyelitis, unspecified: Secondary | ICD-10-CM | POA: Insufficient documentation

## 2017-03-05 DIAGNOSIS — A09 Infectious gastroenteritis and colitis, unspecified: Secondary | ICD-10-CM | POA: Diagnosis not present

## 2017-03-05 DIAGNOSIS — E860 Dehydration: Secondary | ICD-10-CM | POA: Diagnosis present

## 2017-03-05 DIAGNOSIS — Z8249 Family history of ischemic heart disease and other diseases of the circulatory system: Secondary | ICD-10-CM

## 2017-03-05 DIAGNOSIS — B20 Human immunodeficiency virus [HIV] disease: Secondary | ICD-10-CM | POA: Diagnosis present

## 2017-03-05 DIAGNOSIS — R197 Diarrhea, unspecified: Secondary | ICD-10-CM | POA: Diagnosis present

## 2017-03-05 DIAGNOSIS — G9340 Encephalopathy, unspecified: Secondary | ICD-10-CM | POA: Diagnosis present

## 2017-03-05 DIAGNOSIS — A0811 Acute gastroenteropathy due to Norwalk agent: Secondary | ICD-10-CM | POA: Diagnosis not present

## 2017-03-05 DIAGNOSIS — N3090 Cystitis, unspecified without hematuria: Secondary | ICD-10-CM | POA: Diagnosis present

## 2017-03-05 DIAGNOSIS — K0889 Other specified disorders of teeth and supporting structures: Secondary | ICD-10-CM | POA: Diagnosis present

## 2017-03-05 DIAGNOSIS — A044 Other intestinal Escherichia coli infections: Principal | ICD-10-CM | POA: Diagnosis present

## 2017-03-05 DIAGNOSIS — A039 Shigellosis, unspecified: Secondary | ICD-10-CM

## 2017-03-05 DIAGNOSIS — F17210 Nicotine dependence, cigarettes, uncomplicated: Secondary | ICD-10-CM | POA: Diagnosis present

## 2017-03-05 LAB — COMPREHENSIVE METABOLIC PANEL
ALT: 23 U/L (ref 17–63)
AST: 23 U/L (ref 15–41)
Albumin: 3.1 g/dL — ABNORMAL LOW (ref 3.5–5.0)
Alkaline Phosphatase: 72 U/L (ref 38–126)
Anion gap: 10 (ref 5–15)
BILIRUBIN TOTAL: 0.5 mg/dL (ref 0.3–1.2)
BUN: 11 mg/dL (ref 6–20)
CHLORIDE: 105 mmol/L (ref 101–111)
CO2: 21 mmol/L — ABNORMAL LOW (ref 22–32)
Calcium: 8.8 mg/dL — ABNORMAL LOW (ref 8.9–10.3)
Creatinine, Ser: 0.55 mg/dL — ABNORMAL LOW (ref 0.61–1.24)
Glucose, Bld: 91 mg/dL (ref 65–99)
POTASSIUM: 3.9 mmol/L (ref 3.5–5.1)
Sodium: 136 mmol/L (ref 135–145)
TOTAL PROTEIN: 7.9 g/dL (ref 6.5–8.1)

## 2017-03-05 LAB — CSF CELL COUNT WITH DIFFERENTIAL
RBC COUNT CSF: 2 /mm3 — AB
RBC COUNT CSF: 2 /mm3 — AB
TUBE #: 1
TUBE #: 4
WBC CSF: 3 /mm3 (ref 0–5)
WBC, CSF: 2 /mm3 (ref 0–5)

## 2017-03-05 LAB — URINALYSIS, ROUTINE W REFLEX MICROSCOPIC
BILIRUBIN URINE: NEGATIVE
Glucose, UA: NEGATIVE mg/dL
HGB URINE DIPSTICK: NEGATIVE
Ketones, ur: NEGATIVE mg/dL
NITRITE: NEGATIVE
PH: 5 (ref 5.0–8.0)
Protein, ur: 30 mg/dL — AB
RBC / HPF: NONE SEEN RBC/hpf (ref 0–5)
SPECIFIC GRAVITY, URINE: 1.02 (ref 1.005–1.030)

## 2017-03-05 LAB — CBC
HEMATOCRIT: 36.8 % — AB (ref 39.0–52.0)
Hemoglobin: 12.2 g/dL — ABNORMAL LOW (ref 13.0–17.0)
MCH: 31 pg (ref 26.0–34.0)
MCHC: 33.2 g/dL (ref 30.0–36.0)
MCV: 93.6 fL (ref 78.0–100.0)
PLATELETS: 281 10*3/uL (ref 150–400)
RBC: 3.93 MIL/uL — AB (ref 4.22–5.81)
RDW: 15 % (ref 11.5–15.5)
WBC: 4.8 10*3/uL (ref 4.0–10.5)

## 2017-03-05 LAB — CRYPTOCOCCAL ANTIGEN, CSF: CRYPTO AG: NEGATIVE

## 2017-03-05 LAB — PROTEIN AND GLUCOSE, CSF
Glucose, CSF: 55 mg/dL (ref 40–70)
Total  Protein, CSF: 59 mg/dL — ABNORMAL HIGH (ref 15–45)

## 2017-03-05 LAB — LIPASE, BLOOD: LIPASE: 52 U/L — AB (ref 11–51)

## 2017-03-05 LAB — LACTATE DEHYDROGENASE: LDH: 348 U/L — ABNORMAL HIGH (ref 98–192)

## 2017-03-05 MED ORDER — SODIUM CHLORIDE 0.9 % IV BOLUS (SEPSIS)
1000.0000 mL | Freq: Once | INTRAVENOUS | Status: AC
  Administered 2017-03-05: 1000 mL via INTRAVENOUS

## 2017-03-05 MED ORDER — SODIUM CHLORIDE 0.9% FLUSH
3.0000 mL | Freq: Two times a day (BID) | INTRAVENOUS | Status: DC
  Administered 2017-03-05 – 2017-03-08 (×4): 3 mL via INTRAVENOUS

## 2017-03-05 MED ORDER — IOPAMIDOL (ISOVUE-300) INJECTION 61%
INTRAVENOUS | Status: AC
  Administered 2017-03-05: 100 mL
  Filled 2017-03-05: qty 100

## 2017-03-05 MED ORDER — AZITHROMYCIN 500 MG PO TABS
1000.0000 mg | ORAL_TABLET | Freq: Once | ORAL | Status: DC
  Filled 2017-03-05: qty 2

## 2017-03-05 MED ORDER — DEXTROSE 5 % IV SOLN
1.0000 g | Freq: Once | INTRAVENOUS | Status: AC
  Administered 2017-03-05: 1 g via INTRAVENOUS
  Filled 2017-03-05: qty 10

## 2017-03-05 MED ORDER — ENOXAPARIN SODIUM 40 MG/0.4ML ~~LOC~~ SOLN
40.0000 mg | SUBCUTANEOUS | Status: DC
  Administered 2017-03-05 – 2017-03-06 (×2): 40 mg via SUBCUTANEOUS
  Filled 2017-03-05 (×3): qty 0.4

## 2017-03-05 MED ORDER — AZITHROMYCIN 250 MG PO TABS
1000.0000 mg | ORAL_TABLET | Freq: Once | ORAL | Status: AC
  Administered 2017-03-05: 1000 mg via ORAL
  Filled 2017-03-05: qty 4

## 2017-03-05 MED ORDER — AZITHROMYCIN 250 MG PO TABS: 1000.0000 mg | ORAL_TABLET | Freq: Once | ORAL | Status: DC

## 2017-03-05 MED ORDER — ONDANSETRON HCL 4 MG PO TABS
4.0000 mg | ORAL_TABLET | Freq: Four times a day (QID) | ORAL | Status: DC | PRN
Start: 2017-03-05 — End: 2017-03-08

## 2017-03-05 MED ORDER — DICYCLOMINE HCL 10 MG PO CAPS
20.0000 mg | ORAL_CAPSULE | Freq: Once | ORAL | Status: AC
  Administered 2017-03-05: 20 mg via ORAL
  Filled 2017-03-05: qty 2

## 2017-03-05 MED ORDER — ONDANSETRON HCL 4 MG/2ML IJ SOLN: 4.0000 mg | Freq: Four times a day (QID) | INTRAMUSCULAR | Status: DC | PRN

## 2017-03-05 MED ORDER — ACETAMINOPHEN 325 MG PO TABS
650.0000 mg | ORAL_TABLET | Freq: Four times a day (QID) | ORAL | Status: DC | PRN
  Administered 2017-03-07: 650 mg via ORAL
  Filled 2017-03-05: qty 2

## 2017-03-05 MED ORDER — LORAZEPAM 2 MG/ML IJ SOLN
1.0000 mg | Freq: Once | INTRAMUSCULAR | Status: AC
  Administered 2017-03-05: 1 mg via INTRAVENOUS
  Filled 2017-03-05: qty 1

## 2017-03-05 MED ORDER — KETOROLAC TROMETHAMINE 15 MG/ML IJ SOLN
15.0000 mg | Freq: Once | INTRAMUSCULAR | Status: AC
  Administered 2017-03-05: 15 mg via INTRAVENOUS
  Filled 2017-03-05: qty 1

## 2017-03-05 MED ORDER — CIPROFLOXACIN HCL 500 MG PO TABS
500.0000 mg | ORAL_TABLET | Freq: Once | ORAL | Status: AC
  Administered 2017-03-05: 500 mg via ORAL
  Filled 2017-03-05: qty 1

## 2017-03-05 MED ORDER — LIDOCAINE-PRILOCAINE 2.5-2.5 % EX CREA
TOPICAL_CREAM | Freq: Once | CUTANEOUS | Status: AC
  Administered 2017-03-05: 14:00:00 via TOPICAL
  Filled 2017-03-05: qty 5

## 2017-03-05 MED ORDER — METRONIDAZOLE 500 MG PO TABS
500.0000 mg | ORAL_TABLET | Freq: Two times a day (BID) | ORAL | Status: DC
  Administered 2017-03-05 – 2017-03-08 (×6): 500 mg via ORAL
  Filled 2017-03-05 (×6): qty 1

## 2017-03-05 MED ORDER — METRONIDAZOLE 500 MG PO TABS
500.0000 mg | ORAL_TABLET | Freq: Once | ORAL | Status: AC
  Administered 2017-03-05: 500 mg via ORAL
  Filled 2017-03-05: qty 1

## 2017-03-05 MED ORDER — CIPROFLOXACIN HCL 500 MG PO TABS
500.0000 mg | ORAL_TABLET | Freq: Two times a day (BID) | ORAL | Status: DC
  Administered 2017-03-05 – 2017-03-06 (×2): 500 mg via ORAL
  Filled 2017-03-05 (×2): qty 1

## 2017-03-05 MED ORDER — LIDOCAINE-EPINEPHRINE (PF) 2 %-1:200000 IJ SOLN
10.0000 mL | Freq: Once | INTRAMUSCULAR | Status: AC
  Administered 2017-03-05: 10 mL via INTRADERMAL
  Filled 2017-03-05: qty 20

## 2017-03-05 MED ORDER — KETOROLAC TROMETHAMINE 15 MG/ML IJ SOLN
15.0000 mg | Freq: Four times a day (QID) | INTRAMUSCULAR | Status: AC | PRN
  Administered 2017-03-06 – 2017-03-07 (×2): 15 mg via INTRAVENOUS
  Filled 2017-03-05 (×2): qty 1

## 2017-03-05 MED ORDER — CEFTRIAXONE SODIUM 250 MG IJ SOLR
250.0000 mg | Freq: Once | INTRAMUSCULAR | Status: DC
Start: 2017-03-05 — End: 2017-03-05

## 2017-03-05 MED ORDER — ONDANSETRON HCL 4 MG/2ML IJ SOLN
4.0000 mg | Freq: Once | INTRAMUSCULAR | Status: AC
  Administered 2017-03-05: 4 mg via INTRAVENOUS
  Filled 2017-03-05: qty 2

## 2017-03-05 MED ORDER — ACETAMINOPHEN 650 MG RE SUPP: 650.0000 mg | Freq: Four times a day (QID) | RECTAL | Status: DC | PRN

## 2017-03-05 NOTE — ED Provider Notes (Signed)
[facility]-EMERGENCY DEPT Provider Note   CSN: [id number] Arrival date & time: 03/05/17  2440     History   Chief Complaint Chief Complaint  Patient presents with  . Abdominal Pain    HPI Jacob Gutierrez is a 31 y.o. male.  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 Patient reportedly has had persistent watery, intermittently blood-tinged diarrhea for 3-4 days. He has had associated nausea, vomiting, and poor appetite. He initially declines any headache or mental status changes. Denies any fevers. He denies any abdominal pain in between episodes of intermittent cramp-like pain, which resolved completely between episodes. Denies any hematuria or dysuria or frequency. Denies any penile discharge. No other medical complaints at this time.  Past Medical History:  Diagnosis Date  . 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 H/O coccidioidomycosis    pulmonary   . HIV disease (HCC)   . Past history of allergy to penicillin-type antibiotic 07-03-2012   desensitization  . PNA (pneumonia)   . Shigella gastroenteritis   . Syphilis    history /treated     Patient Active Problem List   Diagnosis Date Noted  . Diarrhea 03/05/2017  . AIDS (acquired immune deficiency syndrome) (HCC)   . Meningoencephalitis   . Proctitis   . Weight loss   . Homelessness   . 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 Syphilis contact, treated 01/02/2014  . Tobacco use disorder 01/02/2014  . Human immunodeficiency virus (HIV) disease (HCC) 01/01/2014    History reviewed. No pertinent surgical history.     Home Medications    Prior to Admission medications   Medication Sig Start Date End Date Taking? Authorizing Provider  Darunavir Ethanolate (PREZISTA) 800 MG tablet Take 1 tablet (800 mg total) by mouth daily. 02/06/14  Yes [name], [name], MD  elvitegravir-cobicistat-emtricitabine-tenofovir (STRIBILD) 150-150-200-300 MG TABS tablet Take 1  tablet by mouth daily. 02/06/14  Yes [name], [name], MD  fluconazole (DIFLUCAN) 150 MG tablet Take 150 mg by mouth daily.   Yes [provider]  ibuprofen (ADVIL,MOTRIN) 800 MG tablet Take 800 mg by mouth every 8 (eight) hours as needed for moderate pain.   Yes [provider]  sulfamethoxazole-trimethoprim (BACTRIM DS) 800-160 MG per tablet Take 1 tablet by mouth daily. 02/06/14  Yes [name], [name], MD    Family History Family History  Problem Relation Age of Onset  . Hypertension Mother   . Lupus Maternal Grandmother   . Cancer Paternal Grandfather     Social History Social History  Substance Use Topics  . Smoking status: Current Every Day Smoker    Packs/day: 0.50    Types: Cigarettes  . Smokeless tobacco: Never Used     Comment: cutting back  . Alcohol use 0.5 oz/week    1 Standard drinks or equivalent per week     Comment: whiskey occasionally      Allergies   Amoxicillin   Review of Systems Review of Systems  Constitutional: Positive for fatigue. Negative for chills and fever.  HENT: Negative for congestion and rhinorrhea.   Eyes: Negative for visual disturbance.  Respiratory: Negative for cough, shortness of breath and wheezing.   Cardiovascular: Negative for chest pain and leg swelling.  Gastrointestinal: Positive for abdominal pain, blood in stool, diarrhea, nausea and vomiting.  Genitourinary: Negative for dysuria and flank pain.  Musculoskeletal: Negative for neck pain and neck stiffness.  Skin: Negative for rash and wound.  Allergic/Immunologic: Negative for immunocompromised state.  Neurological: Negative for syncope, weakness and headaches.  All other systems reviewed and are negative.    Physical Exam Updated Vital Signs BP 134/85   Pulse 75   Temp 98 F (36.7 C) (Oral)   Resp 16   Ht 6\' 1"  (1.854 m)   Wt 83.9 kg (185 lb)   SpO2 100%   BMI 24.41 kg/m   Physical Exam  Constitutional: He appears well-developed and  well-nourished. No distress.  HENT:  Head: Normocephalic and atraumatic.  Moderately dry mucous membranes  Eyes: Conjunctivae are normal.  Neck: Neck supple.  Cardiovascular: Normal rate, regular rhythm and normal heart sounds.  Exam reveals no friction rub.   No murmur heard. Pulmonary/Chest: Effort normal and breath sounds normal. No respiratory distress. He has no wheezes. He has no rales.  Abdominal: He exhibits no distension.  Musculoskeletal: He exhibits no edema.  Neurological: He exhibits normal muscle tone.  Drowsy, intermittently falling asleep during exam and history. Pupils equal and round. Cranial nerves intact. Moves all extremities with 5 out of 5 strength. Intact distal sensation to light touch.  Skin: Skin is warm. Capillary refill takes less than 2 seconds.  Psychiatric: He has a normal mood and affect.  Nursing note and vitals reviewed.    ED Treatments / Results  Labs (all labs ordered are listed, but only abnormal results are displayed) Labs Reviewed  LIPASE, BLOOD - Abnormal; Notable for the following:       Result Value   Lipase 52 (*)    All other components within normal limits  COMPREHENSIVE METABOLIC PANEL - Abnormal; Notable for the following:    CO2 21 (*)    Creatinine, Ser 0.55 (*)    Calcium 8.8 (*)    Albumin 3.1 (*)    All other components within normal limits  CBC - Abnormal; Notable for the following:    RBC 3.93 (*)    Hemoglobin 12.2 (*)    HCT 36.8 (*)    All other components within normal limits  URINALYSIS, ROUTINE W REFLEX MICROSCOPIC - Abnormal; Notable for the following:    APPearance HAZY (*)    Protein, ur 30 (*)    Leukocytes, UA TRACE (*)    Bacteria, UA FEW (*)    Squamous Epithelial / LPF 0-5 (*)    All other components within normal limits  LACTATE DEHYDROGENASE - Abnormal; Notable for the following:    LDH 348 (*)    All other components within normal limits  CSF CELL COUNT WITH DIFFERENTIAL - Abnormal; Notable for  the following:    Appearance, CSF CLEAR (*)    RBC Count, CSF 2 (*)    All other components within normal limits  CSF CELL COUNT WITH DIFFERENTIAL - Abnormal; Notable for the following:    Appearance, CSF CLEAR (*)    RBC Count, CSF 2 (*)    All other components within normal limits  PROTEIN AND GLUCOSE, CSF - Abnormal; Notable for the following:    Total  Protein, CSF 59 (*)    All other components within normal limits  CSF CULTURE  GASTROINTESTINAL PANEL BY PCR, STOOL (REPLACES STOOL CULTURE)  ACID FAST SMEAR (AFB)  ACID FAST CULTURE WITH REFLEXED SENSITIVITIES  CULTURE, BLOOD (ROUTINE X 2)  CULTURE, BLOOD (ROUTINE X 2)  URINE CULTURE  ACID FAST SMEAR (AFB)  ACID FAST CULTURE WITH REFLEXED SENSITIVITIES  GIARDIA/CRYPTOSPORIDIUM EIA  C DIFFICILE QUICK SCREEN W PCR REFLEX  CRYPTOCOCCAL  ANTIGEN, CSF  T-HELPER CELLS (CD4) COUNT (NOT AT [facility])  MISC [facility] TEST (SEND OUT)  CMV DNA BY PCR, QUALITATIVE  HERPES SIMPLEX VIRUS(HSV) DNA BY PCR  JC VIRUS, PCR CSF  VDRL, CSF  MISC [facility] TEST (SEND OUT)  RPR  HIV-1 RNA ULTRAQUANT REFLEX TO GENTYP+  CD4/CD8 (T-HELPER/T-SUPPRESSOR CELL)  GC/CHLAMYDIA PROBE AMP ([place]) NOT AT [facility]    EKG  EKG Interpretation None       Radiology Ct Head Wo Contrast  Result Date: 03/05/2017 CLINICAL DATA:  Headaches for 2 weeks EXAM: CT HEAD WITHOUT CONTRAST TECHNIQUE: Contiguous axial images were obtained from the base of the skull through the vertex without intravenous contrast. COMPARISON:  None. FINDINGS: Brain: No findings to suggest acute hemorrhage, acute infarction or space-occupying mass lesion are noted. Area of prior ischemia is noted along the medial aspect of the left posterior parietal lobe at the vertex. Vascular: No hyperdense vessel or unexpected calcification. Skull: Normal. Negative for fracture or focal lesion. Sinuses/Orbits: No acute finding. Other: None. IMPRESSION: Findings of prior ischemia.  No acute abnormality is  noted. Electronically Signed   By: Alcide Clever M.D.   On: 03/05/2017 13:27   Ct Abdomen Pelvis W Contrast  Result Date: 03/05/2017 CLINICAL DATA:  Diarrhea and abdominal cramping for the past 3 days. EXAM: CT ABDOMEN AND PELVIS WITH CONTRAST TECHNIQUE: Multidetector CT imaging of the abdomen and pelvis was performed using the standard protocol following bolus administration of intravenous contrast. CONTRAST:  ISOVUE-300 IOPAMIDOL (ISOVUE-300) INJECTION 61% COMPARISON:  None. FINDINGS: Lower chest: Unremarkable. Hepatobiliary: Probable tiny cyst near the dome of the liver on the right. Normal appearing gallbladder. Pancreas: Unremarkable. No pancreatic ductal dilatation or surrounding inflammatory changes. Spleen: Normal in size without focal abnormality. Adrenals/Urinary Tract: Mild diffuse bladder wall thickening. Normal appearing adrenal glands, kidneys and ureters. No urinary tract calculi or hydronephrosis. Stomach/Bowel: Poorly distended rectum with diffuse wall thickening measuring up to 1.4 cm in thickness. Unremarkable stomach and small bowel. No evidence of appendicitis. Vascular/Lymphatic: Multiple mildly enlarged retroperitoneal, iliac, obturator and inguinal lymph nodes. These include a left para-aortic node with a short axis diameter of 10 mm on image number 39 of series 3, additional left para-aortic node with a short axis diameter of 12 mm on image number 44 of series 3, right common iliac node with a short axis diameter of 10 mm on image number 60 of series 3, right anterior obturator/common femoral node with a short axis diameter of 10 mm on image number 79 of series 3 and left inguinal node with a short axis diameter of 12 mm on image number 86 of series 3. No vascular calcifications are aneurysm seen. Reproductive: Mildly enlarged prostate gland. Other: No abdominal wall hernia or abnormality. No abdominopelvic ascites. 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  IMPRESSION: 1. Poorly distended rectum with diffuse wall thickening. The wall thickening may be due to proctitis. 2. Poorly distended urinary bladder with mild diffuse wall thickening. The wall thickening may be due to cystitis. 3. Mild retroperitoneal, iliac, obturator and inguinal adenopathy. In the absence of known malignancy, this is most likely reactive. Electronically Signed   By: Beckie Salts M.D.   On: 03/05/2017 10:19    Procedures .Lumbar Puncture Date/Time: 03/05/2017 6:41 PM Performed by: Shaune Pollack Authorized by: Shaune Pollack   Consent:    Consent obtained:  Verbal   Consent given by:  Patient   Risks discussed:  Bleeding, infection, pain, repeat procedure, nerve damage and headache  Alternatives discussed:  Delayed treatment and observation Pre-procedure details:    Procedure purpose:  Diagnostic   Preparation: Patient was prepped and draped in usual sterile fashion   Sedation:    Sedation type:  Anxiolysis Anesthesia (see MAR for exact dosages):    Anesthesia method:  Local infiltration   Local anesthetic:  Lidocaine 1% WITH epi Procedure details:    Lumbar space:  L4-L5 interspace   Needle gauge:  22   Needle type:  Spinal needle - Quincke tip   Needle length (in):  3.5   Number of attempts:  1   Opening pressure (cm H2O):  17   Fluid appearance:  Clear   Tubes of fluid:  4   Total volume (ml):  6 Post-procedure:    Puncture site:  Adhesive bandage applied   (including critical care time)  Medications Ordered in ED Medications  ketorolac (TORADOL) 15 MG/ML injection 15 mg (not administered)  sodium chloride 0.9 % bolus 1,000 mL (0 mLs Intravenous Stopped 03/05/17 1059)  ondansetron (ZOFRAN) injection 4 mg (4 mg Intravenous Given 03/05/17 0923)  dicyclomine (BENTYL) capsule 20 mg (20 mg Oral Given 03/05/17 0923)  ketorolac (TORADOL) 15 MG/ML injection 15 mg (15 mg Intravenous Given 03/05/17 0923)  iopamidol (ISOVUE-300) 61 % injection (100 mLs  Contrast  Given 03/05/17 0946)  ciprofloxacin (CIPRO) tablet 500 mg (500 mg Oral Given 03/05/17 1059)  metroNIDAZOLE (FLAGYL) tablet 500 mg (500 mg Oral Given 03/05/17 1059)  lidocaine-prilocaine (EMLA) cream ( Topical Given 03/05/17 1341)  lidocaine-EPINEPHrine (XYLOCAINE W/EPI) 2 %-1:200000 (PF) injection 10 mL (10 mLs Intradermal Given 03/05/17 1417)  LORazepam (ATIVAN) injection 1 mg (1 mg Intravenous Given 03/05/17 1417)  cefTRIAXone (ROCEPHIN) 1 g in dextrose 5 % 50 mL IVPB (1 g Intravenous New Bag/Given 03/05/17 1712)  azithromycin (ZITHROMAX) tablet 1,000 mg (1,000 mg Oral Given 03/05/17 1643)     Initial Impression / Assessment and Plan / ED Course  I have reviewed the triage vital signs and the nursing notes.  Pertinent labs & imaging results that were available during my care of the patient were reviewed by me and considered in my medical decision making (see chart for details).     31 yo M with HIV-AIDS here with abdominal pain, headaches, intermittent AMS. On exam, pt mildly dehydrated, also mildly encephalopathic and drowsy. Labs, CT imaging obtained. CT A/P shows proctitis, and lab work shows mild dehydration - pt given fluids, cipro/flagyl, and stool studies sent. Rocephin/Azithro also given for possible STI-related proctitis, GC/C ordered from anal area. 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 LP performed and shows normal OP, however, and cx, PCP PCR, cell counts sent. Will hold on empiric coverage for CNS infection as pt has no signs of bacterial meningitis at this time. 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 Dr. Daiva EvesVan Dam of ID consulted and will see pt, is in agreement with plan.  Final Clinical Impressions(s) / ED Diagnoses   Final diagnoses:  AIDS (acquired immune deficiency syndrome) (HCC)  Proctitis  Cystitis  Dehydration    New  Prescriptions New Prescriptions   No medications on file     Shaune PollackIsaacs, Avrohom Mckelvin, MD 03/05/17 1844

## 2017-03-05 NOTE — ED Notes (Signed)
Patient returned placed on monitor.  

## 2017-03-05 NOTE — ED Notes (Signed)
Attempted to call report

## 2017-03-05 NOTE — H&P (Signed)
Family Medicine Teaching [facility] Admission History and Physical Service Pager: [phone]  Patient name: Jacob Gutierrez Medical record number: [id number] Date of birth: 1986/06/30 Age: 31 y.o. Gender: male  Primary Care Provider: Grayce Sessions, NP Consultants: ID  Code Status: Full   Chief Complaint: diarrhea, abdominal pain,   Assessment and Plan: Jacob Gutierrez is a 31 y.o. male presenting with diarrhea, abdominal pain, increased sleepiness past medical history significant for HIV with AIDS, tobacco abuse, Cryptosporidium, anal dysplasia, syphilis(treated).  Diarrhea/hematochezia/abdominal pain: Vital signs stable on admission, afebrile and nonseptic appearing. No leukocytosis, white blood cell count 4.8 (may be low secondary to AIDS). Hemoglobin stable at 12.2. Electrolytes within normal limits. Abdominal exam showing tenderness to palpation throughout, mostly in the lower quadrants. CT abdomen showing "Poorly distended rectum with diffuse wall thickening. The wall thickening may be due to proctitis. Poorly distended urinary bladder with mild diffuse wall thickening. The wall thickening may be due to cystitis. Mild retroperitoneal, iliac, obturator and inguinal adenopathy.In the absence of known malignancy, this is most likely reactive". S/p 1 dose of ceftriaxone, Flagyl, Cipro in ED. S/p 1 L normal saline bolus. Likely infectious proctitis based off of CT scan and history of immunodeficiency.  -Admit to family medicine teaching service, stepdown unit, attending Dr. Pollie Meyer -Vitals per floor protocol -Tylenol PRN for mild pain and Toradol PRN for moderate pain -Zofran PRN -We'll order regular diet, patient has been eating normally at home at requesting full diet, will back down if he is unable to tolerate this.  -GI pathology Panel pending -Collect stool ova and parasites, C. Diff -ID consulted, appreciate assistance -Enteric precautions -Will need to add fluids if not  taking good PO -Continue ciprofloxacin and Flagyl. S/p 1 dose CTX for possible rectal STD -Daily CBC and BMP   Headaches and fatigue: CT Head showing prior ischemia, nothing acute. No nuchal rigidity on exam. Neurologically intact however he is sleepy. LP done in ED. Opening pressure 13 according to ED provider. CSF studies showing clear tap with normal glucose to 55, mildly elevated protein to 59, and to few to count RBC. Possible viral etiology. With hx of AIDs, will need to rule out meningitis.  -CSF cultures pending -MRI brain -Other CSF studies include JC virus, VDRL, MTB, herpes simplex, Toxoplasma -Tylenol and Toradol as above for pain control -TSH  HIV AIDS: Last CD4 count in Epic is 8 in 2015. Is not currently taking any antiretrovirals or antibiotics. His mother notes that he did go to [facility] for some treatment a couple months ago but she is unsure what it was. -ID consulted as above, appreciate their assistance -We will review care everywhere to see any records from [facility] -Blood and urine cultures pending -CSF studies as above -We'll also check for STDs with gonorrhea and chlamydia urine studies, very empirically treated with ceftriaxone -acid fast blood cultures -CD4 count  Tobacco Abuse:  -Tobacco abuse counseling when patient was sleepy   Social: Patient is homeless - Will consult CSW   FEN/GI: regular diet, saline lock IV for now Prophylaxis: Lovenox  Disposition: Admit to family medicine teaching service, stepdown unit, attending Dr. Pollie Meyer   History of Present Illness:  Jacob Gutierrez is a 31 y.o. male presenting with diarrhea, abdominal pain, increased sleepiness past medical history significant for HIV with AIDS, tobacco abuse, Cryptosporidium, anal dysplasia, syphilis(treated).   Patient is sleepy but does provide some history. Mother and 2 ounces at bedside who provided history as well.  Patient comes in today with watery blood-tinged  diarrhea for the last 3-4 days associated with abdominal pain. Additionally patient has had occasional headaches and increased sleepiness. Mother notes that patient will start eating a meal and fall asleep midway through. He also falls asleep midway through conversations. She does not know how long this has been going on because he has been homeless for the last 6 months and probably and only recently came to see her within the last week due to a funeral within the family.  Patient denies any nausea, vomiting, chest pain, shortness of breath, edema, visual changes, numbness, tingling, weakness, Dizziness. Denies any penile discharge, dysuria.   Of note patient is not on any medications for HIV. His mother notes that he did go to [facility] within the last couple months for treatment.   Review Of Systems: Per HPI with the following additions: See history of present illness   ROS  Patient Active Problem List   Diagnosis Date Noted  . 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 Syphilis contact, treated 01/02/2014  . Tobacco use disorder 01/02/2014  . Human immunodeficiency virus (HIV) disease (HCC) 01/01/2014    Past Medical History: Past Medical History:  Diagnosis Date  . 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 H/O coccidioidomycosis    pulmonary   . HIV disease (HCC)   . Past history of allergy to penicillin-type antibiotic 07-03-2012   desensitization  . PNA (pneumonia)   . Shigella gastroenteritis   . Syphilis    history /treated     Past Surgical History: History reviewed. No pertinent surgical history.  Social History: Social History  Substance Use Topics  . Smoking status: Current Every Day Smoker    Packs/day: 0.50    Types: Cigarettes  . Smokeless tobacco: Never Used     Comment: cutting back  . Alcohol use 0.5 oz/week    1 Standard drinks or equivalent per week     Comment: whiskey occasionally    Additional social history: Smokes  marijuana  Please also refer to relevant sections of EMR.  Family History: Family History  Problem Relation Age of Onset  . Hypertension Mother   . Lupus Maternal Grandmother   . Cancer Paternal Grandfather     Allergies and Medications: Allergies  Allergen Reactions  . Amoxicillin     "had a reaction when i was little"   No current facility-administered medications on file prior to encounter.    Current Outpatient Prescriptions on File Prior to Encounter  Medication Sig Dispense Refill  . Darunavir Ethanolate (PREZISTA) 800 MG tablet Take 1 tablet (800 mg total) by mouth daily. 30 tablet 5  . elvitegravir-cobicistat-emtricitabine-tenofovir (STRIBILD) 150-150-200-300 MG TABS tablet Take 1 tablet by mouth daily. 30 tablet 5  . fluconazole (DIFLUCAN) 150 MG tablet Take 150 mg by mouth daily.    Marland Kitchen ibuprofen (ADVIL,MOTRIN) 800 MG tablet Take 800 mg by mouth every 8 (eight) hours as needed for moderate pain.    Marland Kitchen sulfamethoxazole-trimethoprim (BACTRIM DS) 800-160 MG per tablet Take 1 tablet by mouth daily. 30 tablet 5    Objective: BP 110/68   Pulse 74   Temp 98 F (36.7 C) (Oral)   Resp 16   Ht 6\' 1"  (1.854 m)   Wt 185 lb (83.9 kg)   SpO2 100%   BMI 24.41 kg/m  Exam: General: In no acute distress, sleepy, easily arousable but falls asleep if not being conversive, African-American male laying in  bed   Eyes: Pupils equal and reactive to light, nonicteric sclera HENTM: Temporal wasting, slightly dry mucous membranes, bulging tympanic membranes with fluid behind them, no erythema. No lymphadenopathy  Neck: supple Cardiovascular: Regular rate and rhythm, normal s1 and s2, no murmurs appreciated, trace edema in ankles Respiratory: normal work of breathing, clear to auscultation bilaterally  Gastrointestinal: soft, non distended, tender to palpation throughout but mostly in RLL and LLQ. Normal bowel sounds.  MSK: moves all extremities spontaneously, no nuchal rigidity  Derm:  various ~1cm in diameter hyperpigmented circular lesions throughout legs Neuro: Sleepy, oriented to person, place, and time. CN 2-12 grossly intact. 5/5 strength in bilateral upper and lower extremities. Sensation grossly intact throughout  Psych: Normal mood and affect but sleepy   Labs and Imaging: CBC BMET   Recent Labs Lab 03/05/17 0710  WBC 4.8  HGB 12.2*  HCT 36.8*  PLT 281    Recent Labs Lab 03/05/17 0710  NA 136  K 3.9  CL 105  CO2 21*  BUN 11  CREATININE 0.55*  GLUCOSE 91  CALCIUM 8.8*     Ct Head Wo Contrast  Result Date: 03/05/2017 CLINICAL DATA:  Headaches for 2 weeks EXAM: CT HEAD WITHOUT CONTRAST TECHNIQUE: Contiguous axial images were obtained from the base of the skull through the vertex without intravenous contrast. COMPARISON:  None. FINDINGS: Brain: No findings to suggest acute hemorrhage, acute infarction or space-occupying mass lesion are noted. Area of prior ischemia is noted along the medial aspect of the left posterior parietal lobe at the vertex. Vascular: No hyperdense vessel or unexpected calcification. Skull: Normal. Negative for fracture or focal lesion. Sinuses/Orbits: No acute finding. Other: None. IMPRESSION: Findings of prior ischemia.  No acute abnormality is noted. Electronically Signed   By: Alcide Clever M.D.   On: 03/05/2017 13:27   Ct Abdomen Pelvis W Contrast  Result Date: 03/05/2017 CLINICAL DATA:  Diarrhea and abdominal cramping for the past 3 days. EXAM: CT ABDOMEN AND PELVIS WITH CONTRAST TECHNIQUE: Multidetector CT imaging of the abdomen and pelvis was performed using the standard protocol following bolus administration of intravenous contrast. CONTRAST:  ISOVUE-300 IOPAMIDOL (ISOVUE-300) INJECTION 61% COMPARISON:  None. FINDINGS: Lower chest: Unremarkable. Hepatobiliary: Probable tiny cyst near the dome of the liver on the right. Normal appearing gallbladder. Pancreas: Unremarkable. No pancreatic ductal dilatation or surrounding  inflammatory changes. Spleen: Normal in size without focal abnormality. Adrenals/Urinary Tract: Mild diffuse bladder wall thickening. Normal appearing adrenal glands, kidneys and ureters. No urinary tract calculi or hydronephrosis. Stomach/Bowel: Poorly distended rectum with diffuse wall thickening measuring up to 1.4 cm in thickness. Unremarkable stomach and small bowel. No evidence of appendicitis. Vascular/Lymphatic: Multiple mildly enlarged retroperitoneal, iliac, obturator and inguinal lymph nodes. These include a left para-aortic node with a short axis diameter of 10 mm on image number 39 of series 3, additional left para-aortic node with a short axis diameter of 12 mm on image number 44 of series 3, right common iliac node with a short axis diameter of 10 mm on image number 60 of series 3, right anterior obturator/common femoral node with a short axis diameter of 10 mm on image number 79 of series 3 and left inguinal node with a short axis diameter of 12 mm on image number 86 of series 3. No vascular calcifications are aneurysm seen. Reproductive: Mildly enlarged prostate gland. Other: No abdominal wall hernia or abnormality. No abdominopelvic ascites. 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 IMPRESSION: 1. Poorly distended rectum with diffuse  wall thickening. The wall thickening may be due to proctitis. 2. Poorly distended urinary bladder with mild diffuse wall thickening. The wall thickening may be due to cystitis. 3. Mild retroperitoneal, iliac, obturator and inguinal adenopathy. In the absence of known malignancy, this is most likely reactive. Electronically Signed   By: Beckie SaltsSteven  Reid M.D.   On: 03/05/2017 10:19    Beaulah DinningGambino, Christina M, MD 03/05/2017, 3:34 PM PGY-3, [facility] FPTS Intern pager: [phone], text pages welcome

## 2017-03-05 NOTE — ED Triage Notes (Signed)
Reports lower abdominal cramping for 3 days.  Also noticed blood on tissue when wiping.  Having multiple diarrhea stools a day.

## 2017-03-05 NOTE — Consult Note (Signed)
Date of Admission:  03/05/2017      Reason for Consult: HIV/AIDS, meningoencephalitis, proctitis   Referring Provider: Dr. Erma HeritageIsaacs   Assessment: 1. HIV/AIDS 2. 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 Diarrhea 4. Proctitis by CT scan 5. Homelessness 6. Weight loss  Plan: 1. Add Toxoplasma PCR to CSF 2. "" CMV PCR to CSF 3. Obtain MRI of brain with and without contrast 4. Check RPR in blood, VDRL in CSF if sufficient CSF remaining 5. Send AFB blood cultures 6. Send stool ova and parasites, C diff testing and GI pathogen panel 7. He needs to engage in care with us here in [place]  I spent greater than 80  minutes with the patient including greater than 50% of time in face to face counsel of the patient and family re his HIV/AIDS, somnolence, diarrhea, weight loss and in coordination of his care.   Active Problems:   * No active hospital problems. *     HPI: Jacob Gutierrez is a 31 y.o. male with HIV and AIDS who has not consistently been in care in part due to the fact that he has been homeless and had difficulty accessing care. There are records at [facility] and [facility] I believe from Dr. Laural BenesJohnson in [place]. He does recall being on TRIUMEQ and being placed on that last year he says that he was placed on it while at [facility]. Labs that are available show that he has had a very high viral load in the 100s of thousands of copies if not million's and CD4 counts that have been typically below 100 in fact as low as 10.  He was brought from [place] you is living homeless on the street by his mother to [place] to come to a funeral. When she saw how badly off. Was she had him stay with her. He was [facility] to the [facility] due to multiple loose bowel movements somnolence and fatigue. He had a CT of the abdomen and pelvis performed which showed some thickening in the rectum that might be consistent with proctitis. He also had  some pelvic lymphadenopathy that was seen. He underwent lumbar puncture and I'm told that the opening pressure was normal.  CSF did NOT show pleocytosis  He did have a prior lumbar puncture in 2013 that did show CSF pleocytosis but the lumbar puncture today showed only 2-3 white blood cells.  CSF cryptococcal antigen was negative and CSF gram stain negative.   CSF glucose was 43 and protein 94.  He was given ceftriaxone in the ER and azithromycin after I suggested that he might have a gonorrhea L or chlamydial proctitis.  He is being admitted to the family medicine service.     Review of Systems: Review of Systems  Constitutional: Positive for chills, fever, malaise/fatigue and weight loss.  HENT: Negative for ear discharge, ear pain, hearing loss and tinnitus.   Respiratory: Negative for cough, hemoptysis, sputum production, shortness of breath and wheezing.   Cardiovascular: Negative for chest pain, orthopnea, claudication and leg swelling.  Gastrointestinal: Positive for abdominal pain, diarrhea and nausea.  Genitourinary: Negative for dysuria, frequency and urgency.  Skin: Positive for rash. Negative for itching.  Neurological: Positive for dizziness and weakness. Negative for seizures and loss of consciousness.  Psychiatric/Behavioral: Positive for depression.    Past Medical History:  Diagnosis Date  . 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  H/O coccidioidomycosis    pulmonary   . HIV disease (HCC)   . Past history of allergy to penicillin-type antibiotic 07-03-2012   desensitization  . PNA (pneumonia)   . Shigella gastroenteritis   . Syphilis    history /treated     Social History  Substance Use Topics  . Smoking status: Current Every Day Smoker    Packs/day: 0.50    Types: Cigarettes  . Smokeless tobacco: Never Used     Comment: cutting back  . Alcohol use 0.5 oz/week    1 Standard drinks or equivalent per week      Comment: whiskey occasionally     Family History  Problem Relation Age of Onset  . Hypertension Mother   . Lupus Maternal Grandmother   . Cancer Paternal Grandfather    Allergies  Allergen Reactions  . Amoxicillin     "had a reaction when i was little"    OBJECTIVE: Blood pressure 107/85, pulse 83, temperature 98 F (36.7 C), temperature source Oral, resp. rate 16, height 6\' 1"  (1.854 m), weight 185 lb (83.9 kg), SpO2 100 %.  Physical Exam  HENT:  Head: Normocephalic.  Mouth/Throat: Oropharynx is clear and moist. No oropharyngeal exudate.  Eyes: Pupils are equal, round, and reactive to light. Conjunctivae and EOM are normal. Right eye exhibits no discharge. Left eye exhibits no discharge. No scleral icterus.  Neck: Normal range of motion. Neck supple.  Cardiovascular: Normal rate and regular rhythm.   No murmur heard. Pulmonary/Chest: Effort normal and breath sounds normal. No respiratory distress. He has no wheezes.  Abdominal: Soft. He exhibits no distension. There is tenderness. There is no rebound.  Neurological:  Patient frequently falling asleep during exam.  Skin: Skin is warm. Rash noted. He is not diaphoretic.  Hyperpigmented lesions on leg  Psychiatric: His affect is not blunt and not inappropriate. He is not agitated.  Pt very sleepy    Lab Results Lab Results  Component Value Date   WBC 4.8 03/05/2017   HGB 12.2 (L) 03/05/2017   HCT 36.8 (L) 03/05/2017   MCV 93.6 03/05/2017   PLT 281 03/05/2017    Lab Results  Component Value Date   CREATININE 0.55 (L) 03/05/2017   BUN 11 03/05/2017   NA 136 03/05/2017   K 3.9 03/05/2017   CL 105 03/05/2017   CO2 21 (L) 03/05/2017    Lab Results  Component Value Date   ALT 23 03/05/2017   AST 23 03/05/2017   ALKPHOS 72 03/05/2017   BILITOT 0.5 03/05/2017     Microbiology: Recent Results (from the past 240 hour(s))  CSF culture     Status: None (Preliminary result)   Collection Time: 03/05/17  3:35 PM    Result Value Ref Range Status   Specimen Description CSF  Final   Special Requests Normal  Final   Gram Stain   Final    WBC PRESENT, PREDOMINANTLY MONONUCLEAR NO ORGANISMS SEEN CYTOSPIN SMEAR    Culture PENDING  Incomplete   Report Status PENDING  Incomplete    Acey Lav, MD [facility] [phone] pager    03/05/2017, 5:21 PM

## 2017-03-06 ENCOUNTER — Other Ambulatory Visit: Payer: Self-pay | Admitting: Pharmacist Clinician (PhC)/ Clinical Pharmacy Specialist

## 2017-03-06 ENCOUNTER — Inpatient Hospital Stay (HOSPITAL_COMMUNITY): Payer: Medicaid Other

## 2017-03-06 DIAGNOSIS — A044 Other intestinal Escherichia coli infections: Principal | ICD-10-CM

## 2017-03-06 DIAGNOSIS — K6289 Other specified diseases of anus and rectum: Secondary | ICD-10-CM

## 2017-03-06 DIAGNOSIS — R509 Fever, unspecified: Secondary | ICD-10-CM

## 2017-03-06 DIAGNOSIS — G9340 Encephalopathy, unspecified: Secondary | ICD-10-CM

## 2017-03-06 DIAGNOSIS — A0811 Acute gastroenteropathy due to Norwalk agent: Secondary | ICD-10-CM

## 2017-03-06 DIAGNOSIS — A09 Infectious gastroenteritis and colitis, unspecified: Secondary | ICD-10-CM

## 2017-03-06 DIAGNOSIS — N3090 Cystitis, unspecified without hematuria: Secondary | ICD-10-CM

## 2017-03-06 DIAGNOSIS — A039 Shigellosis, unspecified: Secondary | ICD-10-CM

## 2017-03-06 DIAGNOSIS — B20 Human immunodeficiency virus [HIV] disease: Secondary | ICD-10-CM

## 2017-03-06 DIAGNOSIS — E860 Dehydration: Secondary | ICD-10-CM

## 2017-03-06 LAB — GASTROINTESTINAL PANEL BY PCR, STOOL (REPLACES STOOL CULTURE)
ADENOVIRUS F40/41: NOT DETECTED
ASTROVIRUS: NOT DETECTED
CRYPTOSPORIDIUM: NOT DETECTED
CYCLOSPORA CAYETANENSIS: NOT DETECTED
Campylobacter species: NOT DETECTED
ENTAMOEBA HISTOLYTICA: NOT DETECTED
ENTEROPATHOGENIC E COLI (EPEC): DETECTED — AB
ENTEROTOXIGENIC E COLI (ETEC): NOT DETECTED
Enteroaggregative E coli (EAEC): NOT DETECTED
Giardia lamblia: NOT DETECTED
Norovirus GI/GII: DETECTED — AB
Plesimonas shigelloides: NOT DETECTED
Rotavirus A: NOT DETECTED
Salmonella species: NOT DETECTED
Sapovirus (I, II, IV, and V): NOT DETECTED
Shiga like toxin producing E coli (STEC): NOT DETECTED
Shigella/Enteroinvasive E coli (EIEC): DETECTED — AB
VIBRIO CHOLERAE: NOT DETECTED
VIBRIO SPECIES: NOT DETECTED
YERSINIA ENTEROCOLITICA: NOT DETECTED

## 2017-03-06 LAB — BASIC METABOLIC PANEL
ANION GAP: 7 (ref 5–15)
BUN: 9 mg/dL (ref 6–20)
CHLORIDE: 104 mmol/L (ref 101–111)
CO2: 27 mmol/L (ref 22–32)
Calcium: 8.5 mg/dL — ABNORMAL LOW (ref 8.9–10.3)
Creatinine, Ser: 0.6 mg/dL — ABNORMAL LOW (ref 0.61–1.24)
GFR calc Af Amer: >60 mL/min (ref >60)
GLUCOSE: 97 mg/dL (ref 65–99)
POTASSIUM: 3.7 mmol/L (ref 3.5–5.1)
Sodium: 138 mmol/L (ref 135–145)

## 2017-03-06 LAB — HERPES SIMPLEX VIRUS(HSV) DNA BY PCR
HSV 1 DNA: NEGATIVE
HSV 2 DNA: NEGATIVE

## 2017-03-06 LAB — CBC
HEMATOCRIT: 34.4 % — AB (ref 39.0–52.0)
HEMOGLOBIN: 11.4 g/dL — AB (ref 13.0–17.0)
MCH: 31.4 pg (ref 26.0–34.0)
MCHC: 33.1 g/dL (ref 30.0–36.0)
MCV: 94.8 fL (ref 78.0–100.0)
Platelets: 236 10*3/uL (ref 150–400)
RBC: 3.63 MIL/uL — ABNORMAL LOW (ref 4.22–5.81)
RDW: 15 % (ref 11.5–15.5)
WBC: 2.7 10*3/uL — ABNORMAL LOW (ref 4.0–10.5)

## 2017-03-06 LAB — CD4/CD8 (T-HELPER/T-SUPPRESSOR CELL)
CD4 ABSOLUTE: 10 /uL — AB (ref 500–1900)
CD4%: 1 % — AB (ref 30.0–60.0)
CD8 T Cell Abs: 450 /uL (ref 230–1000)
CD8tox: 59 % — ABNORMAL HIGH (ref 15.0–40.0)
Ratio: 0.02 — ABNORMAL LOW (ref 1.0–3.0)
TOTAL LYMPHOCYTE COUNT: 760 /uL — AB (ref 1000–4000)

## 2017-03-06 LAB — TSH: TSH: 0.9 u[IU]/mL (ref 0.350–4.500)

## 2017-03-06 LAB — MRSA PCR SCREENING: MRSA by PCR: POSITIVE — AB

## 2017-03-06 LAB — VDRL, CSF: SYPHILIS VDRL QUANT CSF: NONREACTIVE

## 2017-03-06 MED ORDER — GADOBENATE DIMEGLUMINE 529 MG/ML IV SOLN
18.0000 mL | Freq: Once | INTRAVENOUS | Status: AC | PRN
  Administered 2017-03-06: 18 mL via INTRAVENOUS

## 2017-03-06 MED ORDER — BICTEGRAVIR-EMTRICITAB-TENOFOV 50-200-25 MG PO TABS: 1.0000 | ORAL_TABLET | Freq: Every day | ORAL | 3 refills | Status: DC

## 2017-03-06 MED ORDER — MUPIROCIN 2 % EX OINT
1.0000 "application " | TOPICAL_OINTMENT | Freq: Two times a day (BID) | CUTANEOUS | Status: DC
  Administered 2017-03-06 – 2017-03-08 (×6): 1 via NASAL
  Filled 2017-03-06 (×3): qty 22

## 2017-03-06 MED ORDER — CHLORHEXIDINE GLUCONATE CLOTH 2 % EX PADS
6.0000 | MEDICATED_PAD | Freq: Every day | CUTANEOUS | Status: DC
  Administered 2017-03-07 – 2017-03-08 (×2): 6 via TOPICAL

## 2017-03-06 MED FILL — BIKTARVY 50-200-25 MG TABS: 50-200-25 | 30 days supply | Qty: 30 | Fill #0

## 2017-03-06 NOTE — Progress Notes (Signed)
Inpt right now. Restart ART with Biktarvy.

## 2017-03-06 NOTE — Progress Notes (Signed)
Subjective: No new complaints, diarrhea has stopped   Antibiotics:  Anti-infectives    Start     Dose/Rate Route Frequency Ordered Stop   03/05/17 2300  ciprofloxacin (CIPRO) tablet 500 mg  Status:  Discontinued     500 mg Oral 2 times daily 03/05/17 2206 03/06/17 1023   03/05/17 2300  metroNIDAZOLE (FLAGYL) tablet 500 mg     500 mg Oral Every 12 hours 03/05/17 2206     03/05/17 2215  azithromycin (ZITHROMAX) tablet 1,000 mg  Status:  Discontinued     1,000 mg Oral  Once 03/05/17 2206 03/05/17 2233   03/05/17 1515  cefTRIAXone (ROCEPHIN) 1 g in dextrose 5 % 50 mL IVPB     1 g 100 mL/hr over 30 Minutes Intravenous  Once 03/05/17 1507 03/05/17 1742   03/05/17 1515  azithromycin (ZITHROMAX) tablet 1,000 mg     1,000 mg Oral  Once 03/05/17 1507 03/05/17 1643   03/05/17 1215  cefTRIAXone (ROCEPHIN) injection 250 mg  Status:  Discontinued     250 mg Intramuscular  Once 03/05/17 1203 03/05/17 1220   03/05/17 1215  azithromycin (ZITHROMAX) tablet 1,000 mg  Status:  Discontinued     1,000 mg Oral  Once 03/05/17 1203 03/05/17 1220   03/05/17 1045  ciprofloxacin (CIPRO) tablet 500 mg     500 mg Oral  Once 03/05/17 1031 03/05/17 1059   03/05/17 1045  metroNIDAZOLE (FLAGYL) tablet 500 mg     500 mg Oral  Once 03/05/17 1031 03/05/17 1059      Medications: Scheduled Meds: . Chlorhexidine Gluconate Cloth  6 each Topical Q0600  . enoxaparin (LOVENOX) injection  40 mg Subcutaneous Q24H  . metroNIDAZOLE  500 mg Oral Q12H  . mupirocin ointment  1 application Nasal BID  . sodium chloride flush  3 mL Intravenous Q12H   Continuous Infusions: PRN Meds:.acetaminophen **OR** acetaminophen, ketorolac, ondansetron **OR** ondansetron (ZOFRAN) IV    Objective: Weight change:   Intake/Output Summary (Last 24 hours) at 03/06/17 1225 Last data filed at 03/06/17 0549  Gross per 24 hour  Intake               50 ml  Output              300 ml  Net             -250 ml   Blood pressure  134/77, pulse 78, temperature 98.8 F (37.1 C), temperature source Oral, resp. rate 13, height 6\' 1"  (1.854 m), weight 188 lb 0.8 oz (85.3 kg), SpO2 99 %. Temp:  [98.2 F (36.8 C)-98.8 F (37.1 C)] 98.8 F (37.1 C) (08/13 0900) Pulse Rate:  [67-92] 78 (08/13 0549) Resp:  [13-16] 13 (08/13 0549) BP: (107-138)/(67-90) 134/77 (08/13 0900) SpO2:  [98 %-100 %] 99 % (08/13 0549) Weight:  [186 lb 4.6 oz (84.5 kg)-188 lb 0.8 oz (85.3 kg)] 188 lb 0.8 oz (85.3 kg) (08/13 0549)  Physical Exam: General: Completely alert and awake unlike yesterday where he was falling asleep frequency throughout the interview HEENT: anicteric sclera, pupils reactive to light and accommodation, EOMI CVS regular rate, normal r,   Chest: No wheezes or respiratory distress  Abdomen: soft nontender, nondistended, normal bowel sounds, Extremities: no  clubbing or edema noted bilaterally Skin: Hyperpigmented lesions on left shin Neuro: nonfocal  CBC:  CBC Latest Ref Rng & Units 03/06/2017 03/05/2017 12/17/2013  WBC 4.0 - 10.5 K/uL 2.7(L) 4.8 2.7(L)  Hemoglobin 13.0 -  17.0 g/dL 11.4(L) 12.2(L) 13.1  Hematocrit 39.0 - 52.0 % 34.4(L) 36.8(L) 37.2(L)  Platelets 150 - 400 K/uL 236 281 161      BMET  Recent Labs  03/05/17 0710 03/06/17 0451  NA 136 138  K 3.9 3.7  CL 105 104  CO2 21* 27  GLUCOSE 91 97  BUN 11 9  CREATININE 0.55* 0.60*  CALCIUM 8.8* 8.5*     Liver Panel   Recent Labs  03/05/17 0710  PROT 7.9  ALBUMIN 3.1*  AST 23  ALT 23  ALKPHOS 72  BILITOT 0.5       Sedimentation Rate No results for input(s): ESRSEDRATE in the last 72 hours. C-Reactive Protein No results for input(s): CRP in the last 72 hours.  Micro Results: Recent Results (from the past 720 hour(s))  Urine culture     Status: Abnormal (Preliminary result)   Collection Time: 03/05/17  7:15 AM  Result Value Ref Range Status   Specimen Description URINE, CLEAN CATCH  Final   Special Requests Normal  Final   Culture  >=100,000 COLONIES/mL GRAM NEGATIVE RODS (A)  Final   Report Status PENDING  Incomplete  CSF culture     Status: None (Preliminary result)   Collection Time: 03/05/17  3:35 PM  Result Value Ref Range Status   Specimen Description CSF  Final   Special Requests Normal  Final   Gram Stain   Final    WBC PRESENT, PREDOMINANTLY MONONUCLEAR NO ORGANISMS SEEN CYTOSPIN SMEAR    Culture NO GROWTH < 24 HOURS  Final   Report Status PENDING  Incomplete  MRSA PCR Screening     Status: Abnormal   Collection Time: 03/05/17 10:30 PM  Result Value Ref Range Status   MRSA by PCR POSITIVE (A) NEGATIVE Final    Comment:        The GeneXpert MRSA Assay (FDA approved for NASAL specimens only), is one component of a comprehensive MRSA colonization surveillance program. It is not intended to diagnose MRSA infection nor to guide or monitor treatment for MRSA infections. RESULT CALLED TO, READ BACK BY AND VERIFIED WITH: OSAGIE,I RN [id number] 03/06/17 MITCHELL,L     Studies/Results: Dg Chest 2 View  Result Date: 03/06/2017 CLINICAL DATA:  Fever of unknown origin.  History of HIV. EXAM: CHEST  2 VIEW COMPARISON:  CT 03/05/2017 . FINDINGS: Mediastinum and hilar structures normal. Heart size normal. No focal infiltrate. No pleural effusion or pneumothorax. Degenerative changes thoracic spine. IMPRESSION: No acute abnormality Electronically Signed   By: Maisie Fushomas  Register   On: 03/06/2017 11:41   Ct Head Wo Contrast  Result Date: 03/05/2017 CLINICAL DATA:  Headaches for 2 weeks EXAM: CT HEAD WITHOUT CONTRAST TECHNIQUE: Contiguous axial images were obtained from the base of the skull through the vertex without intravenous contrast. COMPARISON:  None. FINDINGS: Brain: No findings to suggest acute hemorrhage, acute infarction or space-occupying mass lesion are noted. Area of prior ischemia is noted along the medial aspect of the left posterior parietal lobe at the vertex. Vascular: No hyperdense vessel or unexpected  calcification. Skull: Normal. Negative for fracture or focal lesion. Sinuses/Orbits: No acute finding. Other: None. IMPRESSION: Findings of prior ischemia.  No acute abnormality is noted. Electronically Signed   By: Alcide CleverMark  Lukens M.D.   On: 03/05/2017 13:27   Ct Abdomen Pelvis W Contrast  Result Date: 03/05/2017 CLINICAL DATA:  Diarrhea and abdominal cramping for the past 3 days. EXAM: CT ABDOMEN AND PELVIS WITH CONTRAST TECHNIQUE: Multidetector CT  imaging of the abdomen and pelvis was performed using the standard protocol following bolus administration of intravenous contrast. CONTRAST:  ISOVUE-300 IOPAMIDOL (ISOVUE-300) INJECTION 61% COMPARISON:  None. FINDINGS: Lower chest: Unremarkable. Hepatobiliary: Probable tiny cyst near the dome of the liver on the right. Normal appearing gallbladder. Pancreas: Unremarkable. No pancreatic ductal dilatation or surrounding inflammatory changes. Spleen: Normal in size without focal abnormality. Adrenals/Urinary Tract: Mild diffuse bladder wall thickening. Normal appearing adrenal glands, kidneys and ureters. No urinary tract calculi or hydronephrosis. Stomach/Bowel: Poorly distended rectum with diffuse wall thickening measuring up to 1.4 cm in thickness. Unremarkable stomach and small bowel. No evidence of appendicitis. Vascular/Lymphatic: Multiple mildly enlarged retroperitoneal, iliac, obturator and inguinal lymph nodes. These include a left para-aortic node with a short axis diameter of 10 mm on image number 39 of series 3, additional left para-aortic node with a short axis diameter of 12 mm on image number 44 of series 3, right common iliac node with a short axis diameter of 10 mm on image number 60 of series 3, right anterior obturator/common femoral node with a short axis diameter of 10 mm on image number 79 of series 3 and left inguinal node with a short axis diameter of 12 mm on image number 86 of series 3. No vascular calcifications are aneurysm seen.  Reproductive: Mildly enlarged prostate gland. Other: No abdominal wall hernia or abnormality. No abdominopelvic ascites. 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 IMPRESSION: 1. Poorly distended rectum with diffuse wall thickening. The wall thickening may be due to proctitis. 2. Poorly distended urinary bladder with mild diffuse wall thickening. The wall thickening may be due to cystitis. 3. Mild retroperitoneal, iliac, obturator and inguinal adenopathy. In the absence of known malignancy, this is most likely reactive. Electronically Signed   By: Beckie Salts M.D.   On: 03/05/2017 10:19      Assessment/Plan:  INTERVAL HISTORY: CXR clear   Active Problems:   Diarrhea    Jacob Gutierrez is a 31 y.o. 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 He is status lumbar puncture which had only a few white cells. CSF was negative for cryptococcal antigen. His diarrhea has actually stopped  #1 question encephalitis: Today is not behaving also he has an encephalopathy. I would still like to get an MRI of the brain to ensure there is no CNS pathology  #2 diarrhea: GI pathogen panel is pending I am discontinuing ciprofloxacin. He can continue on metronidazole for now. AFB blood cultures have been sent  #3 HIV and AIDS: He describes having been on Atripla in the past STRIBILD another drug and then TRIUMEQ. Hopefully he did not fail any of these regimens with an RTI resistance. I would like to try him on BIKTARVY. I will talk to ID pharmacy about getting him medications be advancing access. He is currently homeless and without a job. He will be staying with his mother here in [place]. He do not want to start antiretrovirals though to we have excluded a central nervous system infection with an MRI of the brain  #4 airborne precautions his chest x-ray is completely clear there is no need for airborne precautions.  LOS: 1 day    Acey Lav 03/06/2017, 12:25 PM

## 2017-03-06 NOTE — Progress Notes (Signed)
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  Will continue to monitor. Jackie PlumJUlie Halah Whiteside,RN
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
## 2017-03-06 NOTE — Progress Notes (Signed)
Family Medicine Teaching Service Daily Progress Note Intern Pager: [phone]  Patient name: Jacob Gutierrez Medical record number: [id number] Date of birth: 03-06-1986 Age: 31 y.o. Gender: male  Primary Care Provider: Grayce SessionsEdwards, Michelle P, NP Consultants: ID Code Status: Full  Pt Overview and Major Events to Date:  Jacob Gutierrez is a 31 y.o. male presenting with diarrhea, abdominal pain, increased sleepiness past medical history significant for HIV with AIDS, tobacco abuse, Cryptosporidium, anal dysplasia, syphilis(treated).  CSF/blood/stool cultures pending.  Assessment and Plan: Jacob Gutierrez is a 31 y.o. male presenting with diarrhea, abdominal pain, increased sleepiness past medical history significant for HIV with AIDS, tobacco abuse, Cryptosporidium, anal dysplasia, syphilis(treated).  Diarrhea/hematochezia/abdominal pain: Vital signs stable on admission, afebrile and nonseptic appearing. No leukocytosis, white blood cell count 4.8 (may be low secondary to AIDS). Hemoglobin stable at 12.2. Electrolytes within normal limits. Abdominal exam showing tenderness to palpation throughout, mostly in the lower quadrants. CT abdomen showing "Poorly distended rectum with diffuse wall thickening. The wall thickening may be due to proctitis. Poorly distended urinary bladder with mild diffuse wall thickening. The wall thickening may be due to cystitis. Mild retroperitoneal, iliac, obturator and inguinal adenopathy.In the absence of known malignancy, this is most likely reactive". S/p 1 dose of ceftriaxone, Flagyl, Cipro in ED. S/p 1 L normal saline bolus. Likely infectious proctitis based off of CT scan and history of immunodeficiency.  -Admit to family medicine teaching service, stepdown unit, attending Dr. Pollie MeyerMcIntyre -Vitals per floor protocol -Tylenol PRN for mild pain and Toradol PRN for moderate pain -Zofran PRN -Regular diet -GI pathology Panel pending -Collect stool ova and parasites, C.  Diff -ID consulted, appreciate assistance -Enteric precautions -Continue Flagyl. S/p 1 dose CTX for possible rectal STD -ID has d/c'd ciprofloxacin pending GI panel -Daily CBC and BMP   Headaches and fatigue: CT Head showing prior ischemia, nothing acute. No nuchal rigidity on exam. Neurologically intact however he is sleepy. LP done in ED. Opening pressure 13 according to ED provider. CSF studies showing clear tap with normal glucose to 55, mildly elevated protein to 59, and to few to count RBC. Possible viral etiology. With hx of AIDs, will need to rule out meningitis.  -CSF cultures pending -MRI brain pending -Other CSF studies include JC virus, VDRL, MTB, herpes simplex, Toxoplasma -Tylenol and Toradol as above for pain control -TSH wnl 0.900 -mrsa + from nares  HIV AIDS: Last CD4 count in Epic is 8 in 2015. Is not currently taking any antiretrovirals or antibiotics. His mother notes that he did go to [facility] for some treatment a couple months ago but she is unsure what it was. -ID consulted as above, appreciate their assistance -We will review care everywhere to see any records from [facility] -Blood and urine cultures pending -CSF studies as above -We'll also check for STDs with gonorrhea and chlamydia urine studies, very empirically treated with ceftriaxone -acid fast blood cultures -CD4 count (10)  Tobacco Abuse:  -Tobacco abuse counseling when patient was sleepy   Social: Patient is homeless - Will consult CSW   FEN/GI: regular diet, saline lock IV for now Prophylaxis: Lovenox  Disposition: Admit to family medicine teaching service, stepdown unit, attending Dr. Pollie MeyerMcIntyre  Subjective:  Patient was not complaining of pain and wanted to know how long he would be in.   He said he was ok with it when told at least a few days to run a number of tests.   He was tolerating food  and had not had any diarrhea since being admitted.  Pharmacy student tried to clarify  some of his medication history but it still is vague.  Objective: Temp:  [98 F (36.7 C)-98.8 F (37.1 C)] 98.8 F (37.1 C) (08/13 0549) Pulse Rate:  [65-92] 78 (08/13 0549) Resp:  [13-16] 13 (08/13 0549) BP: (105-138)/(65-96) 114/69 (08/13 0549) SpO2:  [98 %-100 %] 99 % (08/13 0549) Weight:  [185 lb (83.9 kg)-188 lb 0.8 oz (85.3 kg)] 188 lb 0.8 oz (85.3 kg) (08/13 0549) Physical Exam: General: In no acute distress, alert and conversation, African-American male laying in bed   Eyes: EOMI, no conjunctivitis HENTM: no nasal discharge MMM , no blistering of lips. No lymphadenopathy  Neck: supple Cardiovascular: Regular rate and rhythm, normal s1 and s2, no murmurs appreciated, trace edema in ankles Respiratory: normal work of breathing, clear to auscultation bilaterally  Gastrointestinal: soft, non distended, tender to palpation throughout but mostly in RLL and LLQ. Normal bowel sounds.  MSK: moves all extremities spontaneously Derm: various ~1cm in diameter hyperpigmented circular lesions throughout legs Neuro: Sleepy, oriented to person, place, and time. CN 2-12 grossly intact. 5/5 strength in bilateral upper and lower extremities. Sensation grossly intact throughout  Psych: Normal mood and affect, pleasant  Laboratory:  Recent Labs Lab 03/05/17 0710  WBC 4.8  HGB 12.2*  HCT 36.8*  PLT 281    Recent Labs Lab 03/05/17 0710 03/06/17 0451  NA 136 138  K 3.9 3.7  CL 105 104  CO2 21* 27  BUN 11 9  CREATININE 0.55* 0.60*  CALCIUM 8.8* 8.5*  PROT 7.9  --   BILITOT 0.5  --   ALKPHOS 72  --   ALT 23  --   AST 23  --   GLUCOSE 91 97    CD4:10  Imaging/Diagnostic Tests: Ct Head Wo Contrast  Result Date: 03/05/2017 CLINICAL DATA:  Headaches for 2 weeks EXAM: CT HEAD WITHOUT CONTRAST TECHNIQUE: Contiguous axial images were obtained from the base of the skull through the vertex without intravenous contrast. COMPARISON:  None. FINDINGS: Brain: No findings to suggest acute  hemorrhage, acute infarction or space-occupying mass lesion are noted. Area of prior ischemia is noted along the medial aspect of the left posterior parietal lobe at the vertex. Vascular: No hyperdense vessel or unexpected calcification. Skull: Normal. Negative for fracture or focal lesion. Sinuses/Orbits: No acute finding. Other: None. IMPRESSION: Findings of prior ischemia.  No acute abnormality is noted. Electronically Signed   By: Alcide Clever M.D.   On: 03/05/2017 13:27   Ct Abdomen Pelvis W Contrast  Result Date: 03/05/2017 CLINICAL DATA:  Diarrhea and abdominal cramping for the past 3 days. EXAM: CT ABDOMEN AND PELVIS WITH CONTRAST TECHNIQUE: Multidetector CT imaging of the abdomen and pelvis was performed using the standard protocol following bolus administration of intravenous contrast. CONTRAST:  ISOVUE-300 IOPAMIDOL (ISOVUE-300) INJECTION 61% COMPARISON:  None. FINDINGS: Lower chest: Unremarkable. Hepatobiliary: Probable tiny cyst near the dome of the liver on the right. Normal appearing gallbladder. Pancreas: Unremarkable. No pancreatic ductal dilatation or surrounding inflammatory changes. Spleen: Normal in size without focal abnormality. Adrenals/Urinary Tract: Mild diffuse bladder wall thickening. Normal appearing adrenal glands, kidneys and ureters. No urinary tract calculi or hydronephrosis. Stomach/Bowel: Poorly distended rectum with diffuse wall thickening measuring up to 1.4 cm in thickness. Unremarkable stomach and small bowel. No evidence of appendicitis. Vascular/Lymphatic: Multiple mildly enlarged retroperitoneal, iliac, obturator and inguinal lymph nodes. These include a left para-aortic node with a short  axis diameter of 10 mm on image number 39 of series 3, additional left para-aortic node with a short axis diameter of 12 mm on image number 44 of series 3, right common iliac node with a short axis diameter of 10 mm on image number 60 of series 3, right anterior obturator/common  femoral node with a short axis diameter of 10 mm on image number 79 of series 3 and left inguinal node with a short axis diameter of 12 mm on image number 86 of series 3. No vascular calcifications are aneurysm seen. Reproductive: Mildly enlarged prostate gland. Other: No abdominal wall hernia or abnormality. No abdominopelvic ascites. 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 IMPRESSION: 1. Poorly distended rectum with diffuse wall thickening. The wall thickening may be due to proctitis. 2. Poorly distended urinary bladder with mild diffuse wall thickening. The wall thickening may be due to cystitis. 3. Mild retroperitoneal, iliac, obturator and inguinal adenopathy. In the absence of known malignancy, this is most likely reactive. Electronically Signed   By: Beckie Salts M.D.   On: 03/05/2017 10:19     Marthenia Rolling, DO 03/06/2017, 6:39 AM PGY-1, [facility] FPTS Intern pager: [phone], text pages welcome

## 2017-03-07 LAB — CBC
HCT: 34 % — ABNORMAL LOW (ref 39.0–52.0)
Hemoglobin: 11.3 g/dL — ABNORMAL LOW (ref 13.0–17.0)
MCH: 31 pg (ref 26.0–34.0)
MCHC: 33.2 g/dL (ref 30.0–36.0)
MCV: 93.2 fL (ref 78.0–100.0)
PLATELETS: 214 10*3/uL (ref 150–400)
RBC: 3.65 MIL/uL — AB (ref 4.22–5.81)
RDW: 14.6 % (ref 11.5–15.5)
WBC: 3.4 10*3/uL — AB (ref 4.0–10.5)

## 2017-03-07 LAB — URINE CULTURE: SPECIAL REQUESTS: NORMAL

## 2017-03-07 LAB — BASIC METABOLIC PANEL
Anion gap: 6 (ref 5–15)
BUN: 12 mg/dL (ref 6–20)
CHLORIDE: 103 mmol/L (ref 101–111)
CO2: 27 mmol/L (ref 22–32)
Calcium: 8.5 mg/dL — ABNORMAL LOW (ref 8.9–10.3)
Creatinine, Ser: 0.74 mg/dL (ref 0.61–1.24)
GFR calc non Af Amer: >60 mL/min (ref >60)
Glucose, Bld: 106 mg/dL — ABNORMAL HIGH (ref 65–99)
POTASSIUM: 4 mmol/L (ref 3.5–5.1)
SODIUM: 136 mmol/L (ref 135–145)

## 2017-03-07 LAB — RPR: RPR Ser Ql: REACTIVE — AB

## 2017-03-07 LAB — MISC LABCORP TEST (SEND OUT)
LABCORP TEST CODE: 138693
Labcorp test code: 138602

## 2017-03-07 LAB — RPR, QUANT+TP ABS (REFLEX)
Rapid Plasma Reagin, Quant: 1:2 {titer} — ABNORMAL HIGH
T Pallidum Abs: POSITIVE — AB

## 2017-03-07 MED ORDER — DEXTROSE 5 % IV SOLN
1.0000 g | INTRAVENOUS | Status: AC
  Administered 2017-03-07 – 2017-03-08 (×2): 1 g via INTRAVENOUS
  Filled 2017-03-07 (×2): qty 10

## 2017-03-07 MED ORDER — SULFAMETHOXAZOLE-TRIMETHOPRIM 800-160 MG PO TABS
1.0000 | ORAL_TABLET | Freq: Every day | ORAL | Status: DC
  Administered 2017-03-07 – 2017-03-08 (×2): 1 via ORAL
  Filled 2017-03-07 (×2): qty 1

## 2017-03-07 MED ORDER — BICTEGRAVIR-EMTRICITAB-TENOFOV 50-200-25 MG PO TABS
1.0000 | ORAL_TABLET | Freq: Every day | ORAL | Status: DC
  Administered 2017-03-07 – 2017-03-08 (×2): 1 via ORAL
  Filled 2017-03-07 (×2): qty 1

## 2017-03-07 NOTE — Progress Notes (Addendum)
Subjective:  Patient got into an argument with his mother. He has been told he cannot stay with his mother anymore by his stepfather. He says he plans on going back to [place] and living on the street.  Antibiotics:  Anti-infectives    Start     Dose/Rate Route Frequency Ordered Stop   03/07/17 1000  cefTRIAXone (ROCEPHIN) 1 g in dextrose 5 % 50 mL IVPB     1 g 100 mL/hr over 30 Minutes Intravenous Every 24 hours 03/07/17 0842 03/09/17 0959   03/07/17 1000  sulfamethoxazole-trimethoprim (BACTRIM DS,SEPTRA DS) 800-160 MG per tablet 1 tablet     1 tablet Oral Daily 03/07/17 0843     03/07/17 1000  bictegravir-emtricitabine-tenofovir AF (BIKTARVY) 50-200-25 MG per tablet 1 tablet     1 tablet Oral Daily 03/07/17 0846     03/05/17 2300  ciprofloxacin (CIPRO) tablet 500 mg  Status:  Discontinued     500 mg Oral 2 times daily 03/05/17 2206 03/06/17 1023   03/05/17 2300  metroNIDAZOLE (FLAGYL) tablet 500 mg     500 mg Oral Every 12 hours 03/05/17 2206     03/05/17 2215  azithromycin (ZITHROMAX) tablet 1,000 mg  Status:  Discontinued     1,000 mg Oral  Once 03/05/17 2206 03/05/17 2233   03/05/17 1515  cefTRIAXone (ROCEPHIN) 1 g in dextrose 5 % 50 mL IVPB     1 g 100 mL/hr over 30 Minutes Intravenous  Once 03/05/17 1507 03/05/17 1742   03/05/17 1515  azithromycin (ZITHROMAX) tablet 1,000 mg     1,000 mg Oral  Once 03/05/17 1507 03/05/17 1643   03/05/17 1215  cefTRIAXone (ROCEPHIN) injection 250 mg  Status:  Discontinued     250 mg Intramuscular  Once 03/05/17 1203 03/05/17 1220   03/05/17 1215  azithromycin (ZITHROMAX) tablet 1,000 mg  Status:  Discontinued     1,000 mg Oral  Once 03/05/17 1203 03/05/17 1220   03/05/17 1045  ciprofloxacin (CIPRO) tablet 500 mg     500 mg Oral  Once 03/05/17 1031 03/05/17 1059   03/05/17 1045  metroNIDAZOLE (FLAGYL) tablet 500 mg     500 mg Oral  Once 03/05/17 1031 03/05/17 1059      Medications: Scheduled Meds: .  bictegravir-emtricitabine-tenofovir AF  1 tablet Oral Daily  . Chlorhexidine Gluconate Cloth  6 each Topical Q0600  . enoxaparin (LOVENOX) injection  40 mg Subcutaneous Q24H  . metroNIDAZOLE  500 mg Oral Q12H  . mupirocin ointment  1 application Nasal BID  . sodium chloride flush  3 mL Intravenous Q12H  . sulfamethoxazole-trimethoprim  1 tablet Oral Daily   Continuous Infusions: . cefTRIAXone (ROCEPHIN)  IV Stopped (03/07/17 1023)   PRN Meds:.acetaminophen **OR** acetaminophen, ketorolac, ondansetron **OR** ondansetron (ZOFRAN) IV    Objective: Weight change: 2 lb 6.3 oz (1.085 kg)  Intake/Output Summary (Last 24 hours) at 03/07/17 1645 Last data filed at 03/07/17 0905  Gross per 24 hour  Intake                0 ml  Output             1800 ml  Net            -1800 ml   Blood pressure (!) 141/87, pulse 62, temperature 98.4 F (36.9 C), temperature source Oral, resp. rate 18, height 6\' 1"  (1.854 m), weight 187 lb 6.3 oz (85 kg), SpO2 100 %. Temp:  [98.1  F (36.7 C)-98.4 F (36.9 C)] 98.4 F (36.9 C) (08/14 1230) Pulse Rate:  [62-82] 62 (08/14 1230) Resp:  [13-18] 18 (08/14 1230) BP: (103-141)/(59-87) 141/87 (08/14 1230) SpO2:  [97 %-100 %] 100 % (08/14 1230) Weight:  [187 lb 6.3 oz (85 kg)] 187 lb 6.3 oz (85 kg) (08/14 0426)  Physical Exam: General: Completely alert and awake and oriented  x3 HEENT: anicteric sclera, pupils reactive to light and accommodation, EOMI CVS regular rate, normal r,   Chest: No wheezes or respiratory distress  Abdomen: soft nontender, nondistended, normal bowel sounds, Extremities: no  clubbing or edema noted bilaterally Skin: Hyperpigmented lesions on left shin Neuro: nonfocal  CBC:  CBC Latest Ref Rng & Units 03/07/2017 03/06/2017 03/05/2017  WBC 4.0 - 10.5 K/uL 3.4(L) 2.7(L) 4.8  Hemoglobin 13.0 - 17.0 g/dL 11.3(L) 11.4(L) 12.2(L)  Hematocrit 39.0 - 52.0 % 34.0(L) 34.4(L) 36.8(L)  Platelets 150 - 400 K/uL 214 236 281       BMET  Recent Labs  03/06/17 0451 03/07/17 0229  NA 138 136  K 3.7 4.0  CL 104 103  CO2 27 27  GLUCOSE 97 106*  BUN 9 12  CREATININE 0.60* 0.74  CALCIUM 8.5* 8.5*     Liver Panel   Recent Labs  03/05/17 0710  PROT 7.9  ALBUMIN 3.1*  AST 23  ALT 23  ALKPHOS 72  BILITOT 0.5       Sedimentation Rate No results for input(s): ESRSEDRATE in the last 72 hours. C-Reactive Protein No results for input(s): CRP in the last 72 hours.  Micro Results: Recent Results (from the past 720 hour(s))  Urine culture     Status: Abnormal   Collection Time: 03/05/17  7:15 AM  Result Value Ref Range Status   Specimen Description URINE, CLEAN CATCH  Final   Special Requests Normal  Final   Culture (A)  Final    >=100,000 COLONIES/mL ESCHERICHIA COLI Confirmed Extended Spectrum Beta-Lactamase Producer (ESBL)    Report Status 03/07/2017 FINAL  Final   Organism ID, Bacteria ESCHERICHIA COLI (A)  Final      Susceptibility   Escherichia coli - MIC*    AMPICILLIN >=32 RESISTANT Resistant     CEFAZOLIN >=64 RESISTANT Resistant     CEFTRIAXONE >=64 RESISTANT Resistant     CIPROFLOXACIN >=4 RESISTANT Resistant     GENTAMICIN <=1 SENSITIVE Sensitive     IMIPENEM <=0.25 SENSITIVE Sensitive     NITROFURANTOIN <=16 SENSITIVE Sensitive     TRIMETH/SULFA >=320 RESISTANT Resistant     AMPICILLIN/SULBACTAM >=32 RESISTANT Resistant     PIP/TAZO 8 SENSITIVE Sensitive     Extended ESBL POSITIVE Resistant     * >=100,000 COLONIES/mL ESCHERICHIA COLI  Gastrointestinal Panel by PCR , Stool     Status: Abnormal   Collection Time: 03/05/17  1:30 PM  Result Value Ref Range Status   Campylobacter species NOT DETECTED NOT DETECTED Final   Plesimonas shigelloides NOT DETECTED NOT DETECTED Final   Salmonella species NOT DETECTED NOT DETECTED Final   Yersinia enterocolitica NOT DETECTED NOT DETECTED Final   Vibrio species NOT DETECTED NOT DETECTED Final   Vibrio cholerae NOT DETECTED NOT  DETECTED Final   Enteroaggregative E coli (EAEC) NOT DETECTED NOT DETECTED Final   Enteropathogenic E coli (EPEC) DETECTED (A) NOT DETECTED Final    Comment: RESULT CALLED TO, READ BACK BY AND VERIFIED WITH: JULIE EASTWOOD AT 1545 03/06/17. MSS    Enterotoxigenic E coli (ETEC) NOT DETECTED NOT DETECTED Final  Shiga like toxin producing E coli (STEC) NOT DETECTED NOT DETECTED Final   Shigella/Enteroinvasive E coli (EIEC) DETECTED (A) NOT DETECTED Final    Comment: RESULT CALLED TO, READ BACK BY AND VERIFIED WITH: JULIE EASTWOOD AT 1545 03/06/17. MSS    Cryptosporidium NOT DETECTED NOT DETECTED Final   Cyclospora cayetanensis NOT DETECTED NOT DETECTED Final   Entamoeba histolytica NOT DETECTED NOT DETECTED Final   Giardia lamblia NOT DETECTED NOT DETECTED Final   Adenovirus F40/41 NOT DETECTED NOT DETECTED Final   Astrovirus NOT DETECTED NOT DETECTED Final   Norovirus GI/GII DETECTED (A) NOT DETECTED Final    Comment: RESULT CALLED TO, READ BACK BY AND VERIFIED WITH: JULIE EASTWOOD AT 1545 03/06/17. MSS    Rotavirus A NOT DETECTED NOT DETECTED Final   Sapovirus (I, II, IV, and V) NOT DETECTED NOT DETECTED Final  Blood culture (routine x 2)     Status: None (Preliminary result)   Collection Time: 03/05/17  3:17 PM  Result Value Ref Range Status   Specimen Description BLOOD RIGHT ANTECUBITAL  Final   Special Requests   Final    BOTTLES DRAWN AEROBIC AND ANAEROBIC Blood Culture adequate volume   Culture NO GROWTH 2 DAYS  Final   Report Status PENDING  Incomplete  CSF culture     Status: None (Preliminary result)   Collection Time: 03/05/17  3:35 PM  Result Value Ref Range Status   Specimen Description CSF  Final   Special Requests Normal  Final   Gram Stain   Final    WBC PRESENT, PREDOMINANTLY MONONUCLEAR NO ORGANISMS SEEN CYTOSPIN SMEAR    Culture NO GROWTH 2 DAYS  Final   Report Status PENDING  Incomplete  MRSA PCR Screening     Status: Abnormal   Collection Time: 03/05/17  10:30 PM  Result Value Ref Range Status   MRSA by PCR POSITIVE (A) NEGATIVE Final    Comment:        The GeneXpert MRSA Assay (FDA approved for NASAL specimens only), is one component of a comprehensive MRSA colonization surveillance program. It is not intended to diagnose MRSA infection nor to guide or monitor treatment for MRSA infections. RESULT CALLED TO, READ BACK BY AND VERIFIED WITH: OSAGIE,I RN [phone] 03/06/17 MITCHELL,L   Blood culture (routine x 2)     Status: None (Preliminary result)   Collection Time: 03/06/17 12:15 AM  Result Value Ref Range Status   Specimen Description BLOOD RIGHT HAND  Final   Special Requests   Final    BOTTLES DRAWN AEROBIC AND ANAEROBIC Blood Culture results may not be optimal due to an excessive volume of blood received in culture bottles   Culture NO GROWTH 1 DAY  Final   Report Status PENDING  Incomplete    Studies/Results: Dg Chest 2 View  Result Date: 03/06/2017 CLINICAL DATA:  Fever of unknown origin.  History of HIV. EXAM: CHEST  2 VIEW COMPARISON:  CT 03/05/2017 . FINDINGS: Mediastinum and hilar structures normal. Heart size normal. No focal infiltrate. No pleural effusion or pneumothorax. Degenerative changes thoracic spine. IMPRESSION: No acute abnormality Electronically Signed   By: Maisie Fushomas  Register   On: 03/06/2017 11:41   Mr Brain W Wo Contrast  Result Date: 03/06/2017 CLINICAL DATA:  Encephalopathy. History of HIV/AIDS. Headaches and fatigue EXAM: MRI HEAD WITHOUT AND WITH CONTRAST TECHNIQUE: Multiplanar, multiecho pulse sequences of the brain and surrounding structures were obtained without and with intravenous contrast. CONTRAST:  18mL MULTIHANCE GADOBENATE DIMEGLUMINE 529 MG/ML IV  SOLN COMPARISON:  Head CT 03/05/2017 FINDINGS: Brain: The midline structures are normal. There is no focal diffusion restriction to indicate acute infarct. 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 Otherwise normal parenchymal signal. No intraparenchymal  hematoma or chronic microhemorrhage. Brain volume is normal for age without age-advanced or lobar predominant atrophy. The dura is normal and there is no extra-axial collection. Vascular: Major intracranial arterial and venous sinus flow voids are preserved. Skull and upper cervical spine: The visualized skull base, calvarium, upper cervical spine and extracranial soft tissues are normal. Sinuses/Orbits: No fluid levels or advanced mucosal thickening. No mastoid or middle ear effusion. Normal orbits. 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 Electronically Signed   By: Deatra Robinson M.D.   On: 03/06/2017 20:53      Assessment/Plan:  INTERVAL HISTORY: CXR clear MRI of brain without acute findings   Active Problems:   Diarrhea   Cystitis   Dehydration   FUO (fever of unknown origin)   Encephalopathy   Shigella infection   Norovirus   E coli enteritis    Jacob Gutierrez is a 31 y.o. 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 He is status lumbar puncture which had only a few white cells. CSF was negative for cryptococcal antigen. His diarrhea has actually stopped  #1 Question encephalitis: MRI is doubt evidence of encephalitis CSF without crypto or other evidence of infection. His apparent confusion has resolved. I don't know if this might of been some kind of means that he had of coping with family being present?  #2 diarrhea: GI pathogen panel with Shigella neurovirus and Escherichia coli I will give him another 2 doses of ceftriaxone to cover the bacteria  #3 ESBL in the urine: He does not have symptoms of urinary tract infection and this does not need to be treated.  #4HIV and AIDS: He describes having been on Atripla in the past STRIBILD another drug and then TRIUMEQ. Hopefully he did not fail any of these regimens with an RTI resistance. I would like to try him on BIKTARVY. I will  talk to ID pharmacy about getting him medications be advancing access. He is currently homeless and without a job.  He was to be staying with his mother but that is no longer in the plans.   Because he has no evidence of a central nervous system opportunistic infection I'm starting antiretroviral therapy with BIKTARVY  A 1 month supply will be delivered so that he can take it when he is discharged the hospital.  I started Bactrim double strength daily for PCP prophylaxis. I'm not starting Casimiro Needle Bactrim avium prophylaxis in the hopes that his CD4 count will rise with antiretroviral therapy. I am also trying to simplify things as much as possible  I'm disappointed that he is going to be leaving the area and going back to [place] to live on the street. If he would've stayed in [place] we could afford with central [place] network to try to procure housing but that is not something that would happen rapidly.  He should go to [facility] for followup.  I will sign off for now please call with further questions and certainly should the patient decide to stay in [place] would be most happy to have him follow with [place] here.   LOS: 2 days   Acey Lav 03/07/2017, 4:45 PM

## 2017-03-07 NOTE — Progress Notes (Signed)
Anti-retroviral Management:  [facility] stored in main pharmacy. This is to be given to patient at discharge.

## 2017-03-07 NOTE — Progress Notes (Signed)
Family Medicine Teaching Service Daily Progress Note Intern Pager: [phone]  Patient name: Jacob Gutierrez Medical record number: [id number] Date of birth: 04-04-1986 Age: 31 y.o. Gender: male  Primary Care Provider: Grayce Sessions, NP Consultants: ID/SW Code Status: Full  Pt Overview and Major Events to Date:  Jacob Gutierrez a 31 y.o.malepresenting with diarrhea, abdominal pain, increased sleepiness past medical history significant for HIV with AIDS, tobacco abuse, Cryptosporidium, anal dysplasia, syphilis(treated).  CSF (no growth 24hrs)/blood(no growth 24hrs)/stool + for shigella/ecoli/norovirus.  MRI/CXR neg.  Assessment and Plan: Jacob Gutierrez a 31 y.o.malepresenting with diarrhea, abdominal pain, increased sleepiness past medical history significant for HIV with AIDS, tobacco abuse, Cryptosporidium, anal dysplasia, syphilis(treated).  Diarrhea/hematochezia/abdominal pain: Vital signs stable on admission, afebrile and nonseptic appearing. No leukocytosis, white blood cell count 4.8 (may be low secondary to AIDS). Hemoglobin stable at 12.2. Electrolytes within normal limits. Abdominal exam showing tenderness to palpation throughout, mostly in the lower quadrants. CT abdomen showing "Poorly distended rectum with diffuse wall thickening. The wall thickening may be due to proctitis. Poorly distended urinary bladder with mild diffuse wall thickening. The wall thickening may be due to cystitis. Mild retroperitoneal, iliac, obturator and inguinal adenopathy.In the absence of known malignancy, this is most likely reactive". S/p1 dose of ceftriaxone, Flagyl, Cipro in ED. S/p 1 L normal saline bolus. Likely infectious proctitis basedoff of CT scan and history of immunodeficiency.  -Admit to family medicine teaching service, stepdown unit, attending Dr. Pollie Meyer -no bowel movements since admission -Tylenol PRN for mild pain and Toradol PRN for moderate pain -Zofran  PRN -Regular diet -GI pathology Panel + for shigella/norovirus/ecoli -Collect stool ova and parasites, C. Diff -ID consulted, appreciate assistance -Enteric precautions -Continue Flagyl. S/p 1 dose CTX for possible rectal STD -ID has d/c'd ciprofloxacin pending GI panel -Daily CBC and BMP  -physical exam did not indicate LG  Headaches and fatigue: CT Head showingprior ischemia, nothing acute. No nuchal rigidity on exam. Neurologically intact however he is sleepy. LP done in ED. Opening pressure 13 according to ED provider. CSF studies showing clear tap with normal glucose to 55, mildly elevated protein to 59, and to few to count RBC. Possible viral etiology. With hx of AIDs, will need to rule out meningitis.  -CSF no growth @24hrs  -MRI brain neg -Other CSF studies include JC virus(need to confirm order), VDRL neg, MTB (pending-sendout), herpes simplex neg, Toxoplasma pending -Tylenol and Toradol as above for pain control -TSH wnl 0.900 -mrsa + from nares  HIV AIDS: Last CD4 count in Epic is 8 in 2015. Is not currently taking any antiretrovirals or antibiotics. His mother notes that he did go to [facility] for some treatment a couple months ago but she is unsure what it was. -ID consulted as above, appreciate their assistance -We will review care everywhere to see any records from [facility] -Blood culture no growth @24hr  and urine cultures ecoli -CSF studies as above -We'll also check for STDs with gonorrhea and chlamydia urine studies, empirically treated with ceftriaxone -acid fast blood cultures -CD4 count (10)  UTI -ecoli, susceptibiilty results pending -already on metro  Prior scrotal abscess-from 2 weeks ago -patient unsure if packing was removed -incision visible but not purulent  Tobacco Abuse:  -Tobacco abuse counseling when patient was sleepy   Social: Patient is homeless - Will consult CSW   FEN/GI: regular diet, saline lock IV for now Prophylaxis:  Lovenox  Disposition:will need housing, cannot live with family  Subjective:  No  pain, no diarrhea, willing to seek medical treatment for AIDS but has been homeless and unable to followup.   Open to discussion about safe sex practices and informing partners.  Objective: Temp:  [98.1 F (36.7 C)-98.8 F (37.1 C)] 98.1 F (36.7 C) (08/14 0426) Pulse Rate:  [87-88] 87 (08/13 1600) Resp:  [13-15] 13 (08/14 0426) BP: (103-134)/(59-119) 103/59 (08/14 0426) SpO2:  [99 %-100 %] 100 % (08/13 1600) Weight:  [187 lb 6.3 oz (85 kg)] 187 lb 6.3 oz (85 kg) (08/14 0426) Physical Exam: General: In no acute distress, alert and conversation, African-American male laying in bed  Eyes: EOMI, no conjunctivitis HENTM: no nasal discharge MMM , no blistering of lips. No lymphadenopathy  Neck: supple Cardiovascular: Regular rate and rhythm, normal s1 and s2, no murmurs appreciated, trace edema in ankles Respiratory: normal work of breathing, clear to auscultation bilaterally  Gastrointestinal: soft, non distended, tender to palpation throughout but mostly in RLL and LLQ. Normal bowel sounds.  MSK: moves all extremities spontaneously Derm: various ~1cm in diameter hyperpigmented circular lesions throughout legs Neuro: oriented to person, place, and time. CN 2-12 grossly intact. 5/5 strength in bilateral upper and lower extremities. Sensation grossly intact throughout  Psych: Normal mood and affect, pleasant GU exam: no indication of LG, no enlarged lymphnodes palpated, no pain in the groin.   Did have a scrotal absess incision from 2 weeks ago midline/inferior of scrotum that he is not sure if the packing fell out and the wound closed sponaneously.   No pain/purlence at the incision and I was unable to palpate any obvious sign of retained packing.  Laboratory:  Recent Labs Lab 03/05/17 0710 03/06/17 0451 03/07/17 0229  WBC 4.8 2.7* 3.4*  HGB 12.2* 11.4* 11.3*  HCT 36.8* 34.4* 34.0*  PLT 281 236 214     Recent Labs Lab 03/05/17 0710 03/06/17 0451 03/07/17 0229  NA 136 138 136  K 3.9 3.7 4.0  CL 105 104 103  CO2 21* 27 27  BUN 11 9 12   CREATININE 0.55* 0.60* 0.74  CALCIUM 8.8* 8.5* 8.5*  PROT 7.9  --   --   BILITOT 0.5  --   --   ALKPHOS 72  --   --   ALT 23  --   --   AST 23  --   --   GLUCOSE 91 97 106*      Imaging/Diagnostic Tests: Dg Chest 2 View  Result Date: 03/06/2017 CLINICAL DATA:  Fever of unknown origin.  History of HIV. EXAM: CHEST  2 VIEW COMPARISON:  CT 03/05/2017 . FINDINGS: Mediastinum and hilar structures normal. Heart size normal. No focal infiltrate. No pleural effusion or pneumothorax. Degenerative changes thoracic spine. IMPRESSION: No acute abnormality Electronically Signed   By: Maisie Fushomas  Register   On: 03/06/2017 11:41   Ct Head Wo Contrast  Result Date: 03/05/2017 CLINICAL DATA:  Headaches for 2 weeks EXAM: CT HEAD WITHOUT CONTRAST TECHNIQUE: Contiguous axial images were obtained from the base of the skull through the vertex without intravenous contrast. COMPARISON:  None. FINDINGS: Brain: No findings to suggest acute hemorrhage, acute infarction or space-occupying mass lesion are noted. Area of prior ischemia is noted along the medial aspect of the left posterior parietal lobe at the vertex. Vascular: No hyperdense vessel or unexpected calcification. Skull: Normal. Negative for fracture or focal lesion. Sinuses/Orbits: No acute finding. Other: None. IMPRESSION: Findings of prior ischemia.  No acute abnormality is noted. Electronically Signed   By: Alcide CleverMark  Lukens  M.D.   On: 03/05/2017 13:27   Mr Laqueta Jean OZ Contrast  Result Date: 03/06/2017 CLINICAL DATA:  Encephalopathy. History of HIV/AIDS. Headaches and fatigue EXAM: MRI HEAD WITHOUT AND WITH CONTRAST TECHNIQUE: Multiplanar, multiecho pulse sequences of the brain and surrounding structures were obtained without and with intravenous contrast. CONTRAST:  18mL MULTIHANCE GADOBENATE DIMEGLUMINE 529 MG/ML IV  SOLN COMPARISON:  Head CT 03/05/2017 FINDINGS: Brain: The midline structures are normal. There is no focal diffusion restriction to indicate acute infarct. 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 Otherwise normal parenchymal signal. No intraparenchymal hematoma or chronic microhemorrhage. Brain volume is normal for age without age-advanced or lobar predominant atrophy. The dura is normal and there is no extra-axial collection. Vascular: Major intracranial arterial and venous sinus flow voids are preserved. Skull and upper cervical spine: The visualized skull base, calvarium, upper cervical spine and extracranial soft tissues are normal. Sinuses/Orbits: No fluid levels or advanced mucosal thickening. No mastoid or middle ear effusion. Normal orbits. 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 Electronically Signed   By: Deatra Robinson M.D.   On: 03/06/2017 20:53   Ct Abdomen Pelvis W Contrast  Result Date: 03/05/2017 CLINICAL DATA:  Diarrhea and abdominal cramping for the past 3 days. EXAM: CT ABDOMEN AND PELVIS WITH CONTRAST TECHNIQUE: Multidetector CT imaging of the abdomen and pelvis was performed using the standard protocol following bolus administration of intravenous contrast. CONTRAST:  ISOVUE-300 IOPAMIDOL (ISOVUE-300) INJECTION 61% COMPARISON:  None. FINDINGS: Lower chest: Unremarkable. Hepatobiliary: Probable tiny cyst near the dome of the liver on the right. Normal appearing gallbladder. Pancreas: Unremarkable. No pancreatic ductal dilatation or surrounding inflammatory changes. Spleen: Normal in size without focal abnormality. Adrenals/Urinary Tract: Mild diffuse bladder wall thickening. Normal appearing adrenal glands, kidneys and ureters. No urinary tract calculi or hydronephrosis. Stomach/Bowel: Poorly distended rectum with diffuse wall thickening measuring up to 1.4 cm in thickness. Unremarkable stomach and small bowel. No evidence of appendicitis.  Vascular/Lymphatic: Multiple mildly enlarged retroperitoneal, iliac, obturator and inguinal lymph nodes. These include a left para-aortic node with a short axis diameter of 10 mm on image number 39 of series 3, additional left para-aortic node with a short axis diameter of 12 mm on image number 44 of series 3, right common iliac node with a short axis diameter of 10 mm on image number 60 of series 3, right anterior obturator/common femoral node with a short axis diameter of 10 mm on image number 79 of series 3 and left inguinal node with a short axis diameter of 12 mm on image number 86 of series 3. No vascular calcifications are aneurysm seen. Reproductive: Mildly enlarged prostate gland. Other: No abdominal wall hernia or abnormality. No abdominopelvic ascites. 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 IMPRESSION: 1. Poorly distended rectum with diffuse wall thickening. The wall thickening may be due to proctitis. 2. Poorly distended urinary bladder with mild diffuse wall thickening. The wall thickening may be due to cystitis. 3. Mild retroperitoneal, iliac, obturator and inguinal adenopathy. In the absence of known malignancy, this is most likely reactive. Electronically Signed   By: Beckie Salts M.D.   On: 03/05/2017 10:19     Marthenia Rolling, DO 03/07/2017, 6:39 AM PGY-1, [facility] FPTS Intern pager: [phone], text pages welcome

## 2017-03-08 LAB — CBC
HCT: 35 % — ABNORMAL LOW (ref 39.0–52.0)
HEMOGLOBIN: 11.9 g/dL — AB (ref 13.0–17.0)
MCH: 31.5 pg (ref 26.0–34.0)
MCHC: 34 g/dL (ref 30.0–36.0)
MCV: 92.6 fL (ref 78.0–100.0)
Platelets: 234 10*3/uL (ref 150–400)
RBC: 3.78 MIL/uL — AB (ref 4.22–5.81)
RDW: 14.7 % (ref 11.5–15.5)
WBC: 3.2 10*3/uL — AB (ref 4.0–10.5)

## 2017-03-08 LAB — BASIC METABOLIC PANEL
ANION GAP: 9 (ref 5–15)
BUN: 10 mg/dL (ref 6–20)
CALCIUM: 8.5 mg/dL — AB (ref 8.9–10.3)
CO2: 24 mmol/L (ref 22–32)
Chloride: 104 mmol/L (ref 101–111)
Creatinine, Ser: 0.77 mg/dL (ref 0.61–1.24)
Glucose, Bld: 145 mg/dL — ABNORMAL HIGH (ref 65–99)
Potassium: 4 mmol/L (ref 3.5–5.1)
Sodium: 137 mmol/L (ref 135–145)

## 2017-03-08 LAB — GC/CHLAMYDIA PROBE AMP (~~LOC~~) NOT AT ARMC
CHLAMYDIA, DNA PROBE: NEGATIVE
NEISSERIA GONORRHEA: NEGATIVE

## 2017-03-08 LAB — JC VIRUS, PCR CSF: JC Virus PCR, CSF: NEGATIVE

## 2017-03-08 MED ORDER — BENZOCAINE 10 % MT GEL: Freq: Four times a day (QID) | OROMUCOSAL | 0 refills | Status: DC | PRN

## 2017-03-08 MED ORDER — POLYETHYLENE GLYCOL 3350 17 G PO PACK
17.0000 g | PACK | Freq: Every day | ORAL | Status: DC
  Administered 2017-03-08: 17 g via ORAL
  Filled 2017-03-08: qty 1

## 2017-03-08 MED ORDER — SULFAMETHOXAZOLE-TRIMETHOPRIM 800-160 MG PO TABS: 1.0000 | ORAL_TABLET | Freq: Every day | ORAL | 3 refills | Status: DC

## 2017-03-08 MED ORDER — BICTEGRAVIR-EMTRICITAB-TENOFOV 50-200-25 MG PO TABS: 1.0000 | ORAL_TABLET | Freq: Every day | ORAL | 3 refills | Status: DC

## 2017-03-08 MED ORDER — BENZOCAINE 10 % MT GEL
Freq: Four times a day (QID) | OROMUCOSAL | Status: DC | PRN
  Administered 2017-03-08: 1 via OROMUCOSAL
  Filled 2017-03-08: qty 9

## 2017-03-08 NOTE — Discharge Summary (Signed)
Family Medicine Teaching [facility] Discharge Summary  Patient name: Jacob Gutierrez Medical record number: [id number] Date of birth: 11-29-1985 Age: 31 y.o. Gender: male Date of Admission: 03/05/2017  Date of Discharge: 03/08/17 Admitting Physician: Latrelle Dodrill, MD  Primary Care Provider: Grayce Sessions, NP Consultants: ID  Indication for Hospitalization: abdominal pain/ diarrhea w/ hx of AIDS  Discharge Diagnoses/Problem List:  Shigella/ecoli/norovius enteritis AIDS  Disposition: home  Discharge Condition: stable  Discharge Exam: General: In no acute distress, alert and conversation, African-American male laying in bed  Eyes: EOMI, no conjunctivitis HENTM: no nasal discharge MMM , no blistering of lips. No lymphadenopathy.  Lower left canine appears with open cavity.   There does not appear to be infection/absecess in the gingiva.   Now swelling in the jaw line, uvulua midline, no airway.  Neck: supple, FROM Cardiovascular: Regular rate and rhythm, normal s1 and s2, no murmurs appreciated, Respiratory: normal work of breathing, clear to auscultation bilaterally  Gastrointestinal: soft, non distended, no tenderness to palpation. Normal bowel sounds.  MSK: moves all extremities spontaneously Derm: various ~1cm in diameter hyperpigmented circular lesions throughout legs Neuro: oriented to person, place, and time. CN 2-12 grossly intact. 5/5 strength in bilateral upper and lower extremities. Sensation grossly intact throughout  Psych: Normal mood and affect, pleasant GU exam: no indication of LG, no enlarged lymphnodes palpated, no pain in the groin.   Did have a scrotal absess incision from 2 weeks ago midline/inferior of scrotum that he is not sure if the packing fell out and the wound closed sponaneously.   No pain/purlence at the incision and I was unable to palpate any obvious sign of retained packing.  Brief Hospital Course:  [name] is a homeless patient  who was admitted for abdominal pain/diarrhea with hematochezia and a hx of untreated AIDS.   Most relevant positive test results were stool cultures were postitive for shigella/norovirus/ecoli.   CD4 count was 10.  He had no diarrhea or abdominal pain after admission and was stable on the floor for 2 days tolerating regular diet.   He was discharged with a 1 month supply of retroviral treatment and a prescription for high dose bactrim as a prophylaxis for PCP.  Issues for Follow Up:  1. Please ensure he is staying current on antiretroviral therapy 2. Until CD4 count is recovered, please ensure he is on appropriate prophylaxis 3. Please assist with safe sex practices and encouragement to discuss diagnosis with partners 4. Please assist with dental followup if he still requires it.  Significant Procedures: Lumbar puncture  Significant Labs and Imaging:   Recent Labs Lab 03/06/17 0451 03/07/17 0229 03/08/17 0855  WBC 2.7* 3.4* 3.2*  HGB 11.4* 11.3* 11.9*  HCT 34.4* 34.0* 35.0*  PLT 236 214 234    Recent Labs Lab 03/05/17 0710 03/06/17 0451 03/07/17 0229 03/08/17 0855  NA 136 138 136 137  K 3.9 3.7 4.0 4.0  CL 105 104 103 104  CO2 21* 27 27 24   GLUCOSE 91 97 106* 145*  BUN 11 9 12 10   CREATININE 0.55* 0.60* 0.74 0.77  CALCIUM 8.8* 8.5* 8.5* 8.5*  ALKPHOS 72  --   --   --   AST 23  --   --   --   ALT 23  --   --   --   ALBUMIN 3.1*  --   --   --     Dg Chest 2 View  Result Date: 03/06/2017 CLINICAL DATA:  Fever of unknown origin.  History of HIV. EXAM: CHEST  2 VIEW COMPARISON:  CT 03/05/2017 . FINDINGS: Mediastinum and hilar structures normal. Heart size normal. No focal infiltrate. No pleural effusion or pneumothorax. Degenerative changes thoracic spine. IMPRESSION: No acute abnormality Electronically Signed   By: Maisie Fushomas  Register   On: 03/06/2017 11:41   Ct Head Wo Contrast  Result Date: 03/05/2017 CLINICAL DATA:  Headaches for 2 weeks EXAM: CT HEAD WITHOUT CONTRAST  TECHNIQUE: Contiguous axial images were obtained from the base of the skull through the vertex without intravenous contrast. COMPARISON:  None. FINDINGS: Brain: No findings to suggest acute hemorrhage, acute infarction or space-occupying mass lesion are noted. Area of prior ischemia is noted along the medial aspect of the left posterior parietal lobe at the vertex. Vascular: No hyperdense vessel or unexpected calcification. Skull: Normal. Negative for fracture or focal lesion. Sinuses/Orbits: No acute finding. Other: None. IMPRESSION: Findings of prior ischemia.  No acute abnormality is noted. Electronically Signed   By: Alcide CleverMark  Lukens M.D.   On: 03/05/2017 13:27   Mr Laqueta JeanBrain W ZOWo Contrast  Result Date: 03/06/2017 CLINICAL DATA:  Encephalopathy. History of HIV/AIDS. Headaches and fatigue EXAM: MRI HEAD WITHOUT AND WITH CONTRAST TECHNIQUE: Multiplanar, multiecho pulse sequences of the brain and surrounding structures were obtained without and with intravenous contrast. CONTRAST:  18mL MULTIHANCE GADOBENATE DIMEGLUMINE 529 MG/ML IV SOLN COMPARISON:  Head CT 03/05/2017 FINDINGS: Brain: The midline structures are normal. There is no focal diffusion restriction to indicate acute infarct. 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 Otherwise normal parenchymal signal. No intraparenchymal hematoma or chronic microhemorrhage. Brain volume is normal for age without age-advanced or lobar predominant atrophy. The dura is normal and there is no extra-axial collection. Vascular: Major intracranial arterial and venous sinus flow voids are preserved. Skull and upper cervical spine: The visualized skull base, calvarium, upper cervical spine and extracranial soft tissues are normal. Sinuses/Orbits: No fluid levels or advanced mucosal thickening. No mastoid or middle ear effusion. Normal orbits. 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 Electronically Signed   By: Deatra RobinsonKevin  Herman M.D.   On:  03/06/2017 20:53   Ct Abdomen Pelvis W Contrast  Result Date: 03/05/2017 CLINICAL DATA:  Diarrhea and abdominal cramping for the past 3 days. EXAM: CT ABDOMEN AND PELVIS WITH CONTRAST TECHNIQUE: Multidetector CT imaging of the abdomen and pelvis was performed using the standard protocol following bolus administration of intravenous contrast. CONTRAST:  100mL ISOVUE-300 IOPAMIDOL (ISOVUE-300) INJECTION 61% COMPARISON:  None. FINDINGS: Lower chest: Unremarkable. Hepatobiliary: Probable tiny cyst near the dome of the liver on the right. Normal appearing gallbladder. Pancreas: Unremarkable. No pancreatic ductal dilatation or surrounding inflammatory changes. Spleen: Normal in size without focal abnormality. Adrenals/Urinary Tract: Mild diffuse bladder wall thickening. Normal appearing adrenal glands, kidneys and ureters. No urinary tract calculi or hydronephrosis. Stomach/Bowel: Poorly distended rectum with diffuse wall thickening measuring up to 1.4 cm in thickness. Unremarkable stomach and small bowel. No evidence of appendicitis. Vascular/Lymphatic: Multiple mildly enlarged retroperitoneal, iliac, obturator and inguinal lymph nodes. These include a left para-aortic node with a short axis diameter of 10 mm on image number 39 of series 3, additional left para-aortic node with a short axis diameter of 12 mm on image number 44 of series 3, right common iliac node with a short axis diameter of 10 mm on image number 60 of series 3, right anterior obturator/common femoral node with a short axis diameter of 10 mm on image number 79 of  series 3 and left inguinal node with a short axis diameter of 12 mm on image number 86 of series 3. No vascular calcifications are aneurysm seen. Reproductive: Mildly enlarged prostate gland. Other: No abdominal wall hernia or abnormality. No abdominopelvic ascites. 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 IMPRESSION: 1. Poorly distended rectum with diffuse wall  thickening. The wall thickening may be due to proctitis. 2. Poorly distended urinary bladder with mild diffuse wall thickening. The wall thickening may be due to cystitis. 3. Mild retroperitoneal, iliac, obturator and inguinal adenopathy. In the absence of known malignancy, this is most likely reactive. Electronically Signed   By: Beckie Salts M.D.   On: 03/05/2017 10:19    Results/Tests Pending at Time of Discharge: N/A  Discharge Medications:  Allergies as of 03/08/2017      Reactions   Amoxicillin    "had a reaction when i was little"      Medication List    STOP taking these medications   fluconazole 150 MG tablet Commonly known as:  DIFLUCAN   ibuprofen 800 MG tablet Commonly known as:  ADVIL,MOTRIN     TAKE these medications   benzocaine 10 % mucosal gel Commonly known as:  ORAJEL Use as directed in the mouth or throat 4 (four) times daily as needed for mouth pain.   bictegravir-emtricitabine-tenofovir AF 50-200-25 MG Tabs tablet Commonly known as:  BIKTARVY Take 1 tablet by mouth daily.   sulfamethoxazole-trimethoprim 800-160 MG tablet Commonly known as:  BACTRIM DS,SEPTRA DS Take 1 tablet by mouth daily.       Discharge Instructions: Please refer to Patient Instructions section of EMR for full details.  Patient was counseled important signs and symptoms that should prompt return to medical care, changes in medications, dietary instructions, activity restrictions, and follow up appointments.   Follow-Up Appointments:   Marthenia Rolling, DO 03/08/2017, 3:05 PM PGY-1, [facility]

## 2017-03-08 NOTE — Care Management Note (Signed)
Case Management Note Donn PieriniKristi Garland Smouse RN, BSN Unit 4E-Case Manager [phone]  Patient Details  Name: Jacob Gutierrez MRN: [id number] Date of Birth: 02/03/1986  Subjective/Objective:   Pt admitted with diarrhea/abd pain  Action/Plan: PTA pt was homeless- sometimes stays with mom- however can not stay with mom at discharge- CSW consulted for homelessness issues- pt has medicaid. CM to follow.   Expected Discharge Date:  03/08/17               Expected Discharge Plan:  Home/Self Care  In-House Referral:  Clinical Social Work  Discharge planning Services  CM Consult  Post Acute Care Choice:  NA Choice offered to:  NA  DME Arranged:    DME Agency:     HH Arranged:    HH Agency:     Status of Service:  Completed, signed off  If discussed at [employer] Meetings, dates discussed:      Additional Comments:  03/08/17- 1515- Garret Teale RN, CM - pt for d/c today- no CM needs noted for discharge.   Darrold SpanWebster, [name], RN 03/08/2017, 3:13 PM

## 2017-03-08 NOTE — Plan of Care (Signed)
Problem: Pain Managment: Goal: General experience of comfort will improve Outcome: Progressing Pt c/o headaches. Controlled with PRN Tylenol.  Problem: Skin Integrity: Goal: Risk for impaired skin integrity will decrease Outcome: Progressing Pt very mobile in room.  Problem: Bowel/Gastric: Goal: Will not experience complications related to bowel motility Outcome: Not Progressing PT has not had BM since admission on 8/12.

## 2017-03-08 NOTE — Discharge Instructions (Signed)
[name],  You were admitted to the hospital for abdominal pain and diarrhea.   Those symptoms seemed to resolve as soon as you were admitted but we did find a number of concerning issues that require attention.  HIV/AIDS- You have been off treatment for some time now and we have provided a month of Biktandy for you.   We know you have said you will leave town but it is important for you to follow up with a  Physician and stay on treatment in the future.  Stool Sample positive for shigella/ecoli/norovirus- You were treated with abx and have had no more symptoms  Precautionary treatment for pneumonia-  Because your CD4 count is so low you have been prescribed high dose bactrim and need to stay on this until your CD4 recovers  Dental Pain-  We have prescribed you some oral pain ointment but you will need to get your tooth evaluated outpatient as you do not have an evident abscess  Sexual safety-  Please ensure to use safe sex practices and inform all partners of your status so that everyone can pursue medical evaluation and make informed decisions.

## 2017-03-08 NOTE — Progress Notes (Signed)
Jacob Gutierrez to be D/C'd Home per MD order. Discussed with the patient and all questions fully answered.    VVS, Skin clean, dry and intact without evidence of skin break down, no evidence of skin tears noted.  IV catheter discontinued intact. Site without signs and symptoms of complications. Dressing and pressure applied.  An After Visit Summary was printed and given to the patient.   Kai LevinsJacobs, Carita Sollars N  03/08/2017 4:59 PM

## 2017-03-08 NOTE — Progress Notes (Signed)
Family Medicine Teaching Service Daily Progress Note Intern Pager: [phone]  Patient name: Jacob ColumbiaDesmond J Gutierrez Medical record number: [id number] Date of birth: 1986-04-23 Age: 31 y.o. Gender: male  Primary Care Provider: Grayce SessionsEdwards, Michelle P, NP Consultants: ID/SW Code Status: Full  Pt Overview and Major Events to Date:  Jacob HammanDesmond J Claytonis a 31 y.o.malepresenting with diarrhea, abdominal pain, increased sleepiness past medical history significant for HIV with AIDS, tobacco abuse, Cryptosporidium, anal dysplasia, syphilis(treated). CSF (no growth 24hrs)/blood(no growth 24hrs)/stool + for shigella/ecoli/norovirus.  MRI/CXR neg.    Assessment and Plan: Jacob HammanDesmond J Claytonis a 31 y.o.malepresenting with diarrhea, abdominal pain, increased sleepiness past medical history significant for HIV with AIDS, tobacco abuse, Cryptosporidium, anal dysplasia, syphilis(treated).  Diarrhea/hematochezia/abdominal pain: No leukocytosis, white blood cell count 3.4 (likely low secondary to AIDS). Hemoglobin slowly decreasing at 11.3. Electrolytes within normal limits. Abdominal exam showing no tenderness to palpation throughout, mostly in the lower quadrants. CT abdomen showing "Poorly distended rectum with diffuse wall thickening. The wall thickening may be due to proctitis. Poorly distended urinary bladder with mild diffuse wall thickening. The wall thickening may be due to cystitis. Mild retroperitoneal, iliac, obturator and inguinal adenopathy.In the absence of known malignancy, this is most likely reactive". Likely infectious proctitis basedoff of CT scan and history of immunodeficiency.  -Admit to family medicine teaching service, stepdown unit, attending Dr. Gwendolyn GrantWalden -no bowel movements since admission -Tylenol PRN for mild pain and Toradol PRN for moderate pain -Zofran PRN -Regular diet -GI pathology Panel + for shigella/norovirus/ecoli -ID consulted, appreciate assistance -Enteric  precautions -Continue Flagyl to discharge. S/p 1 dose CTX for possible rectal STD -ID started 2 doses of  CTX on 8/14 -Daily CBC and BMP  -physical exam did not indicate LG  Headaches and fatigue: CT Head showingprior ischemia, nothing acute. No nuchal rigidity on exam. Neurologically intact however he is sleepy. LP done in ED. Opening pressure 13 according to ED provider. CSF studies showing clear tap with normal glucose to 55, mildly elevated protein to 59, and to few to count RBC. Possible viral etiology. With hx of AIDs, will need to rule out meningitis.  -CSF no growth @24hrs  -MRI brain neg -Other CSF studies include JC virus(neg), VDRL neg, MTB (pending-sendout), herpes simplex neg, RPR 1:2, Toxoplasma pending -Tylenol and Toradol as above for pain control -TSH wnl 0.900 -mrsa + from nares  HIV AIDS: Last CD4 count in Epic is 8 in 2015. Is not currently taking any antiretrovirals or antibiotics. His mother notes that he did go to [facility] for some treatment a couple months ago but she is unsure what it was. -ID consulted as above, appreciate their assistance --starting biktany (and leavign with 53month dosing) and double bactrim for PCP prophylaxis, -We will review care everywhere to see any records from [facility], have tried other therapies -Blood culture no growth @24hr  and urine cultures ecoli (which is asymptomatic so not treating) -CSF studies as above -empirically treated with ceftriaxone -acid fast blood cultures -CD4 count (10)  UTI -ecoli, susceptibiilty results pending -already on metro  Prior scrotal abscess-from 2 weeks ago -patient unsure if packing was removed, not able to feel any lumps consistent with retained packing on exam -incision visible but not purulent  Dental Pain- -Rotten tooth, canine on lower left mandible -no sign of abscess -patient able to eat/no concern for respiratory complications -benzocaine  Tobacco Abuse:  -Tobacco abuse  counseling when patient was sleepy   Social: Patient is homeless - Will consult CSW  FEN/GI:  regular diet, saline lock IV for now Prophylaxis: Lovenox  Disposition:will need housing, cannot live with family.    Subjective:  Only pain is the new dental pain he mentioned last night that he says is treated   Objective: Temp:  [98.2 F (36.8 C)-98.4 F (36.9 C)] 98.4 F (36.9 C) (08/15 0612) Pulse Rate:  [62-82] 76 (08/15 0612) Resp:  [14-18] 14 (08/15 0612) BP: (129-141)/(76-87) 131/80 (08/15 0612) SpO2:  [97 %-100 %] 98 % (08/15 0612) Physical Exam: General: In no acute distress, alert and conversation, African-American male laying in bed  Eyes: EOMI, no conjunctivitis HENTM: no nasal discharge MMM , no blistering of lips. No lymphadenopathy.  Rotten tooth, canine on lower left mandible no sign of abscess, no airway compromise Neck: supple Cardiovascular: Regular rate and rhythm, normal s1 and s2, no murmurs appreciated, trace edema in ankles Respiratory: normal work of breathing, clear to auscultation bilaterally  Gastrointestinal: soft, non distended, tender to palpation throughout but mostly in RLL and LLQ. Normal bowel sounds.  MSK: moves all extremities spontaneously Derm: various ~1cm in diameter hyperpigmented circular lesions throughout legs Neuro: oriented to person, place, and time. CN 2-12 grossly intact. 5/5 strength in bilateral upper and lower extremities. Sensation grossly intact throughout  Psych: Normal mood and affect, pleasant GU exam: no indication of LG, no enlarged lymphnodes palpated, no pain in the groin.   Did have a scrotal absess incision from 2 weeks ago midline/inferior of scrotum that he is not sure if the packing fell out and the wound closed sponaneously.   No pain/purlence at the incision and I was unable to palpate any obvious sign of retained packing.  Laboratory:  Recent Labs Lab 03/05/17 0710 03/06/17 0451 03/07/17 0229  WBC 4.8 2.7*  3.4*  HGB 12.2* 11.4* 11.3*  HCT 36.8* 34.4* 34.0*  PLT 281 236 214    Recent Labs Lab 03/05/17 0710 03/06/17 0451 03/07/17 0229  NA 136 138 136  K 3.9 3.7 4.0  CL 105 104 103  CO2 21* 27 27  BUN 11 9 12   CREATININE 0.55* 0.60* 0.74  CALCIUM 8.8* 8.5* 8.5*  PROT 7.9  --   --   BILITOT 0.5  --   --   ALKPHOS 72  --   --   ALT 23  --   --   AST 23  --   --   GLUCOSE 91 97 106*      Imaging/Diagnostic Tests: Dg Chest 2 View  Result Date: 03/06/2017 CLINICAL DATA:  Fever of unknown origin.  History of HIV. EXAM: CHEST  2 VIEW COMPARISON:  CT 03/05/2017 . FINDINGS: Mediastinum and hilar structures normal. Heart size normal. No focal infiltrate. No pleural effusion or pneumothorax. Degenerative changes thoracic spine. IMPRESSION: No acute abnormality Electronically Signed   By: Maisie Fus  Register   On: 03/06/2017 11:41   Ct Head Wo Contrast  Result Date: 03/05/2017 CLINICAL DATA:  Headaches for 2 weeks EXAM: CT HEAD WITHOUT CONTRAST TECHNIQUE: Contiguous axial images were obtained from the base of the skull through the vertex without intravenous contrast. COMPARISON:  None. FINDINGS: Brain: No findings to suggest acute hemorrhage, acute infarction or space-occupying mass lesion are noted. Area of prior ischemia is noted along the medial aspect of the left posterior parietal lobe at the vertex. Vascular: No hyperdense vessel or unexpected calcification. Skull: Normal. Negative for fracture or focal lesion. Sinuses/Orbits: No acute finding. Other: None. IMPRESSION: Findings of prior ischemia.  No acute abnormality is noted. Electronically  Signed   By: Alcide Clever M.D.   On: 03/05/2017 13:27   Mr Laqueta Jean WJ Contrast  Result Date: 03/06/2017 CLINICAL DATA:  Encephalopathy. History of HIV/AIDS. Headaches and fatigue EXAM: MRI HEAD WITHOUT AND WITH CONTRAST TECHNIQUE: Multiplanar, multiecho pulse sequences of the brain and surrounding structures were obtained without and with intravenous  contrast. CONTRAST:  18mL MULTIHANCE GADOBENATE DIMEGLUMINE 529 MG/ML IV SOLN COMPARISON:  Head CT 03/05/2017 FINDINGS: Brain: The midline structures are normal. There is no focal diffusion restriction to indicate acute infarct. 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 Otherwise normal parenchymal signal. No intraparenchymal hematoma or chronic microhemorrhage. Brain volume is normal for age without age-advanced or lobar predominant atrophy. The dura is normal and there is no extra-axial collection. Vascular: Major intracranial arterial and venous sinus flow voids are preserved. Skull and upper cervical spine: The visualized skull base, calvarium, upper cervical spine and extracranial soft tissues are normal. Sinuses/Orbits: No fluid levels or advanced mucosal thickening. No mastoid or middle ear effusion. Normal orbits. 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 Electronically Signed   By: Deatra Robinson M.D.   On: 03/06/2017 20:53   Ct Abdomen Pelvis W Contrast  Result Date: 03/05/2017 CLINICAL DATA:  Diarrhea and abdominal cramping for the past 3 days. EXAM: CT ABDOMEN AND PELVIS WITH CONTRAST TECHNIQUE: Multidetector CT imaging of the abdomen and pelvis was performed using the standard protocol following bolus administration of intravenous contrast. CONTRAST:  ISOVUE-300 IOPAMIDOL (ISOVUE-300) INJECTION 61% COMPARISON:  None. FINDINGS: Lower chest: Unremarkable. Hepatobiliary: Probable tiny cyst near the dome of the liver on the right. Normal appearing gallbladder. Pancreas: Unremarkable. No pancreatic ductal dilatation or surrounding inflammatory changes. Spleen: Normal in size without focal abnormality. Adrenals/Urinary Tract: Mild diffuse bladder wall thickening. Normal appearing adrenal glands, kidneys and ureters. No urinary tract calculi or hydronephrosis. Stomach/Bowel: Poorly distended rectum with diffuse wall thickening measuring up to 1.4 cm in  thickness. Unremarkable stomach and small bowel. No evidence of appendicitis. Vascular/Lymphatic: Multiple mildly enlarged retroperitoneal, iliac, obturator and inguinal lymph nodes. These include a left para-aortic node with a short axis diameter of 10 mm on image number 39 of series 3, additional left para-aortic node with a short axis diameter of 12 mm on image number 44 of series 3, right common iliac node with a short axis diameter of 10 mm on image number 60 of series 3, right anterior obturator/common femoral node with a short axis diameter of 10 mm on image number 79 of series 3 and left inguinal node with a short axis diameter of 12 mm on image number 86 of series 3. No vascular calcifications are aneurysm seen. Reproductive: Mildly enlarged prostate gland. Other: No abdominal wall hernia or abnormality. No abdominopelvic ascites. 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 IMPRESSION: 1. Poorly distended rectum with diffuse wall thickening. The wall thickening may be due to proctitis. 2. Poorly distended urinary bladder with mild diffuse wall thickening. The wall thickening may be due to cystitis. 3. Mild retroperitoneal, iliac, obturator and inguinal adenopathy. In the absence of known malignancy, this is most likely reactive. Electronically Signed   By: Beckie Salts M.D.   On: 03/05/2017 10:19     Marthenia Rolling, DO 03/08/2017, 7:11 AM PGY-1, [facility] FPTS Intern pager: [phone], text pages welcome

## 2017-03-09 LAB — CSF CULTURE W GRAM STAIN: Culture: NO GROWTH

## 2017-03-09 LAB — CSF CULTURE: SPECIAL REQUESTS: NORMAL

## 2017-03-10 LAB — CULTURE, BLOOD (ROUTINE X 2)
Culture: NO GROWTH
Special Requests: ADEQUATE

## 2017-03-11 LAB — CULTURE, BLOOD (ROUTINE X 2): CULTURE: NO GROWTH

## 2017-03-16 LAB — HIV-1 RNA ULTRAQUANT REFLEX TO GENTYP+
HIV-1 RNA BY PCR: 906000 {copies}/mL
HIV-1 RNA QUANT, LOG: 5.957 {Log_copies}/mL

## 2017-03-20 LAB — REFLEX TO GENOSURE(R) MG

## 2017-04-06 ENCOUNTER — Telehealth: Payer: Self-pay | Admitting: Pharmacy Technician

## 2017-04-06 NOTE — Telephone Encounter (Signed)
04/03/2017 12:21 PM  Spoke with mother. She said that there is no good phone number to reach him at and she would try and get in touch with him by contacting friends and give them the call center number so that he can call to get refill on medicaiton.

## 2017-04-07 ENCOUNTER — Other Ambulatory Visit: Payer: Self-pay | Admitting: Pharmacist

## 2017-04-28 LAB — MISC LABCORP TEST (SEND OUT): LABCORP TEST CODE: 183641

## 2017-06-12 ENCOUNTER — Telehealth: Payer: Self-pay | Admitting: Infectious Disease

## 2017-06-12 NOTE — Telephone Encounter (Signed)
I am pasting in PICTURES I took of his genotype from [facility]. THESE WERE SUPPOSED TO START FLOWING INTO Epic BUT ARE NOT YET to my knowledge coming in. THis genotype was taken in August and I think I actually photographed it already and scanned before but the lab showed up in my inbox 2 weeks ago?

## 2017-12-09 ENCOUNTER — Emergency Department (HOSPITAL_COMMUNITY): Payer: Medicaid Other | Admitting: Registered Nurse

## 2017-12-09 ENCOUNTER — Encounter (HOSPITAL_COMMUNITY): Payer: Self-pay | Admitting: Emergency Medicine

## 2017-12-09 ENCOUNTER — Emergency Department (HOSPITAL_COMMUNITY): Payer: Medicaid Other

## 2017-12-09 ENCOUNTER — Inpatient Hospital Stay (HOSPITAL_COMMUNITY)
Admission: EM | Admit: 2017-12-09 | Discharge: 2017-12-15 | DRG: 974 | Disposition: A | Discharge status: Home / Self Care | Payer: Medicaid Other | Attending: Family Medicine | Admitting: Family Medicine

## 2017-12-09 ENCOUNTER — Encounter (HOSPITAL_COMMUNITY)
Admission: EM | Disposition: A | Discharge status: Home / Self Care | Payer: Self-pay | Source: Home / Self Care | Attending: Internal Medicine

## 2017-12-09 ENCOUNTER — Other Ambulatory Visit: Payer: Self-pay

## 2017-12-09 DIAGNOSIS — E861 Hypovolemia: Secondary | ICD-10-CM | POA: Diagnosis not present

## 2017-12-09 DIAGNOSIS — Z809 Family history of malignant neoplasm, unspecified: Secondary | ICD-10-CM

## 2017-12-09 DIAGNOSIS — Z6822 Body mass index (BMI) 22.0-22.9, adult: Secondary | ICD-10-CM

## 2017-12-09 DIAGNOSIS — Z8249 Family history of ischemic heart disease and other diseases of the circulatory system: Secondary | ICD-10-CM | POA: Diagnosis not present

## 2017-12-09 DIAGNOSIS — Z9119 Patient's noncompliance with other medical treatment and regimen: Secondary | ICD-10-CM | POA: Diagnosis not present

## 2017-12-09 DIAGNOSIS — E2740 Unspecified adrenocortical insufficiency: Secondary | ICD-10-CM | POA: Diagnosis present

## 2017-12-09 DIAGNOSIS — Z881 Allergy status to other antibiotic agents status: Secondary | ICD-10-CM | POA: Diagnosis not present

## 2017-12-09 DIAGNOSIS — Z880 Allergy status to penicillin: Secondary | ICD-10-CM

## 2017-12-09 DIAGNOSIS — A419 Sepsis, unspecified organism: Secondary | ICD-10-CM | POA: Diagnosis present

## 2017-12-09 DIAGNOSIS — R042 Hemoptysis: Secondary | ICD-10-CM | POA: Diagnosis present

## 2017-12-09 DIAGNOSIS — Z8619 Personal history of other infectious and parasitic diseases: Secondary | ICD-10-CM | POA: Diagnosis present

## 2017-12-09 DIAGNOSIS — B370 Candidal stomatitis: Secondary | ICD-10-CM | POA: Diagnosis present

## 2017-12-09 DIAGNOSIS — K209 Esophagitis, unspecified without bleeding: Secondary | ICD-10-CM

## 2017-12-09 DIAGNOSIS — E43 Unspecified severe protein-calorie malnutrition: Secondary | ICD-10-CM | POA: Diagnosis present

## 2017-12-09 DIAGNOSIS — N410 Acute prostatitis: Secondary | ICD-10-CM | POA: Diagnosis present

## 2017-12-09 DIAGNOSIS — Z832 Family history of diseases of the blood and blood-forming organs and certain disorders involving the immune mechanism: Secondary | ICD-10-CM

## 2017-12-09 DIAGNOSIS — R0602 Shortness of breath: Secondary | ICD-10-CM | POA: Diagnosis present

## 2017-12-09 DIAGNOSIS — B3781 Candidal esophagitis: Secondary | ICD-10-CM | POA: Diagnosis present

## 2017-12-09 DIAGNOSIS — E876 Hypokalemia: Secondary | ICD-10-CM | POA: Diagnosis not present

## 2017-12-09 DIAGNOSIS — E222 Syndrome of inappropriate secretion of antidiuretic hormone: Secondary | ICD-10-CM | POA: Diagnosis present

## 2017-12-09 DIAGNOSIS — N412 Abscess of prostate: Secondary | ICD-10-CM | POA: Diagnosis present

## 2017-12-09 DIAGNOSIS — F17210 Nicotine dependence, cigarettes, uncomplicated: Secondary | ICD-10-CM | POA: Diagnosis present

## 2017-12-09 DIAGNOSIS — R29898 Other symptoms and signs involving the musculoskeletal system: Secondary | ICD-10-CM

## 2017-12-09 DIAGNOSIS — R64 Cachexia: Secondary | ICD-10-CM | POA: Diagnosis present

## 2017-12-09 DIAGNOSIS — Z8614 Personal history of Methicillin resistant Staphylococcus aureus infection: Secondary | ICD-10-CM | POA: Diagnosis not present

## 2017-12-09 DIAGNOSIS — B20 Human immunodeficiency virus [HIV] disease: Secondary | ICD-10-CM | POA: Diagnosis present

## 2017-12-09 HISTORY — PX: TRANSURETHRAL RESECTION OF PROSTATE: SHX73

## 2017-12-09 LAB — I-STAT CG4 LACTIC ACID, ED: LACTIC ACID, VENOUS: 0.52 mmol/L (ref 0.5–1.9)

## 2017-12-09 LAB — BASIC METABOLIC PANEL
Anion gap: 7 (ref 5–15)
BUN: 7 mg/dL (ref 6–20)
CHLORIDE: 97 mmol/L — AB (ref 101–111)
CO2: 27 mmol/L (ref 22–32)
Calcium: 8.4 mg/dL — ABNORMAL LOW (ref 8.9–10.3)
Creatinine, Ser: 0.62 mg/dL (ref 0.61–1.24)
GFR calc Af Amer: >60 mL/min (ref >60)
GFR calc non Af Amer: >60 mL/min (ref >60)
GLUCOSE: 84 mg/dL (ref 65–99)
Potassium: 3.7 mmol/L (ref 3.5–5.1)
SODIUM: 131 mmol/L — AB (ref 135–145)

## 2017-12-09 LAB — CBC
HEMATOCRIT: 32.9 % — AB (ref 39.0–52.0)
Hemoglobin: 10.8 g/dL — ABNORMAL LOW (ref 13.0–17.0)
MCH: 30.3 pg (ref 26.0–34.0)
MCHC: 32.8 g/dL (ref 30.0–36.0)
MCV: 92.4 fL (ref 78.0–100.0)
Platelets: 207 10*3/uL (ref 150–400)
RBC: 3.56 MIL/uL — ABNORMAL LOW (ref 4.22–5.81)
RDW: 14.1 % (ref 11.5–15.5)
WBC: 1.5 10*3/uL — AB (ref 4.0–10.5)

## 2017-12-09 LAB — I-STAT TROPONIN, ED: Troponin i, poc: 0 ng/mL (ref 0.00–0.08)

## 2017-12-09 SURGERY — TURP (TRANSURETHRAL RESECTION OF PROSTATE)
Anesthesia: General

## 2017-12-09 MED ORDER — LIDOCAINE 2% (20 MG/ML) 5 ML SYRINGE
INTRAMUSCULAR | Status: AC
  Filled 2017-12-09: qty 5

## 2017-12-09 MED ORDER — PROMETHAZINE HCL 25 MG/ML IJ SOLN: 6.2500 mg | INTRAMUSCULAR | Status: DC | PRN

## 2017-12-09 MED ORDER — LIDOCAINE HCL (CARDIAC) PF 50 MG/5ML IV SOSY
PREFILLED_SYRINGE | INTRAVENOUS | Status: DC | PRN
  Administered 2017-12-09: 25 mg via INTRAVENOUS
  Administered 2017-12-09: 75 mg via INTRAVENOUS

## 2017-12-09 MED ORDER — DEXAMETHASONE SODIUM PHOSPHATE 10 MG/ML IJ SOLN
INTRAMUSCULAR | Status: AC
  Filled 2017-12-09: qty 1

## 2017-12-09 MED ORDER — SUCCINYLCHOLINE CHLORIDE 200 MG/10ML IV SOSY
PREFILLED_SYRINGE | INTRAVENOUS | Status: AC
Start: 2017-12-09 — End: ?
  Filled 2017-12-09: qty 10

## 2017-12-09 MED ORDER — MIDAZOLAM HCL 2 MG/2ML IJ SOLN
INTRAMUSCULAR | Status: AC
  Filled 2017-12-09: qty 2

## 2017-12-09 MED ORDER — HYDROMORPHONE HCL 1 MG/ML IJ SOLN
INTRAMUSCULAR | Status: DC | PRN
  Administered 2017-12-09 (×2): 1 mg via INTRAVENOUS

## 2017-12-09 MED ORDER — PROPOFOL 10 MG/ML IV BOLUS
INTRAVENOUS | Status: AC
  Filled 2017-12-09: qty 20

## 2017-12-09 MED ORDER — VANCOMYCIN HCL IN DEXTROSE 1-5 GM/200ML-% IV SOLN
1000.0000 mg | Freq: Three times a day (TID) | INTRAVENOUS | Status: DC
  Administered 2017-12-10 – 2017-12-12 (×8): 1000 mg via INTRAVENOUS
  Filled 2017-12-09 (×8): qty 200

## 2017-12-09 MED ORDER — LIDOCAINE HCL URETHRAL/MUCOSAL 2 % EX GEL
CUTANEOUS | Status: AC
Start: 2017-12-09 — End: ?
  Filled 2017-12-09: qty 5

## 2017-12-09 MED ORDER — PROPOFOL 10 MG/ML IV BOLUS
INTRAVENOUS | Status: DC | PRN
  Administered 2017-12-09: 160 mg via INTRAVENOUS

## 2017-12-09 MED ORDER — BELLADONNA-OPIUM 16.2-30 MG RE SUPP
RECTAL | Status: AC
  Filled 2017-12-09: qty 1

## 2017-12-09 MED ORDER — GENTAMICIN SULFATE 40 MG/ML IJ SOLN
5.0000 mg/kg | INTRAVENOUS | Status: DC
  Administered 2017-12-09: 390 mg via INTRAVENOUS
  Filled 2017-12-09 (×2): qty 9.75

## 2017-12-09 MED ORDER — FLUCONAZOLE IN SODIUM CHLORIDE 200-0.9 MG/100ML-% IV SOLN
200.0000 mg | INTRAVENOUS | Status: DC
  Administered 2017-12-09 – 2017-12-12 (×4): 200 mg via INTRAVENOUS
  Filled 2017-12-09 (×6): qty 100

## 2017-12-09 MED ORDER — LACTATED RINGERS IV SOLN
INTRAVENOUS | Status: DC
  Administered 2017-12-09 (×3): via INTRAVENOUS

## 2017-12-09 MED ORDER — IOHEXOL 300 MG/ML  SOLN
100.0000 mL | Freq: Once | INTRAMUSCULAR | Status: AC | PRN
  Administered 2017-12-09: 100 mL via INTRAVENOUS

## 2017-12-09 MED ORDER — SODIUM CHLORIDE 0.9 % IR SOLN
Status: DC | PRN
  Administered 2017-12-09 (×6): 3000 mL via INTRAVESICAL

## 2017-12-09 MED ORDER — ACETAMINOPHEN 325 MG PO TABS
650.0000 mg | ORAL_TABLET | Freq: Once | ORAL | Status: AC
  Administered 2017-12-09: 650 mg via ORAL
  Filled 2017-12-09: qty 2

## 2017-12-09 MED ORDER — FENTANYL CITRATE (PF) 100 MCG/2ML IJ SOLN
INTRAMUSCULAR | Status: DC | PRN
  Administered 2017-12-09: 100 ug via INTRAVENOUS
  Administered 2017-12-09: 50 ug via INTRAVENOUS
  Administered 2017-12-09: 100 ug via INTRAVENOUS
  Administered 2017-12-09: 50 ug via INTRAVENOUS

## 2017-12-09 MED ORDER — ACETAMINOPHEN 10 MG/ML IV SOLN
INTRAVENOUS | Status: DC | PRN
  Administered 2017-12-09: 1000 mg via INTRAVENOUS

## 2017-12-09 MED ORDER — VANCOMYCIN HCL IN DEXTROSE 1-5 GM/200ML-% IV SOLN
1000.0000 mg | Freq: Once | INTRAVENOUS | Status: AC
  Administered 2017-12-09: 1000 mg via INTRAVENOUS
  Filled 2017-12-09: qty 200

## 2017-12-09 MED ORDER — HYDROMORPHONE HCL 1 MG/ML IJ SOLN: 0.2500 mg | INTRAMUSCULAR | Status: DC | PRN

## 2017-12-09 MED ORDER — ONDANSETRON HCL 4 MG/2ML IJ SOLN
INTRAMUSCULAR | Status: AC
  Filled 2017-12-09: qty 2

## 2017-12-09 MED ORDER — MIDAZOLAM HCL 5 MG/5ML IJ SOLN
INTRAMUSCULAR | Status: DC | PRN
  Administered 2017-12-09: 2 mg via INTRAVENOUS

## 2017-12-09 MED ORDER — ONDANSETRON HCL 4 MG/2ML IJ SOLN
INTRAMUSCULAR | Status: DC | PRN
  Administered 2017-12-09: 4 mg via INTRAVENOUS

## 2017-12-09 MED ORDER — MEPERIDINE HCL 50 MG/ML IJ SOLN: 6.2500 mg | INTRAMUSCULAR | Status: DC | PRN

## 2017-12-09 MED ORDER — FENTANYL CITRATE (PF) 250 MCG/5ML IJ SOLN
INTRAMUSCULAR | Status: AC
  Filled 2017-12-09: qty 5

## 2017-12-09 MED ORDER — VANCOMYCIN HCL 500 MG IV SOLR
500.0000 mg | Freq: Once | INTRAVENOUS | Status: AC
  Administered 2017-12-09: 500 mg via INTRAVENOUS
  Filled 2017-12-09: qty 500

## 2017-12-09 MED ORDER — ALBUMIN HUMAN 5 % IV SOLN
INTRAVENOUS | Status: AC
  Filled 2017-12-09: qty 500

## 2017-12-09 MED ORDER — SODIUM CHLORIDE 0.9 % IV BOLUS
1000.0000 mL | Freq: Once | INTRAVENOUS | Status: AC
  Administered 2017-12-09: 1000 mL via INTRAVENOUS

## 2017-12-09 MED ORDER — HYDROMORPHONE HCL 2 MG/ML IJ SOLN
INTRAMUSCULAR | Status: AC
  Filled 2017-12-09: qty 1

## 2017-12-09 MED ORDER — ALBUMIN HUMAN 5 % IV SOLN
INTRAVENOUS | Status: DC | PRN
  Administered 2017-12-09 (×2): via INTRAVENOUS

## 2017-12-09 MED ORDER — SUCCINYLCHOLINE CHLORIDE 20 MG/ML IJ SOLN
INTRAMUSCULAR | Status: DC | PRN
  Administered 2017-12-09: 120 mg via INTRAVENOUS

## 2017-12-09 SURGICAL SUPPLY — 25 items
BAG URINE DRAINAGE (UROLOGICAL SUPPLIES) ×2 IMPLANT
BAG URO CATCHER STRL LF (MISCELLANEOUS) ×2 IMPLANT
CATH FOLEY 2WAY SLVR 30CC 24FR (CATHETERS) ×2 IMPLANT
CATH FOLEY 3WAY 30CC 22FR (CATHETERS) ×2 IMPLANT
CATH FOLEY 3WAY 30CC 24FR (CATHETERS)
CATH URTH STD 24FR FL 3W 2 (CATHETERS) IMPLANT
CONT SPEC 4OZ CLIKSEAL STRL BL (MISCELLANEOUS) ×2 IMPLANT
COVER FOOTSWITCH UNIV (MISCELLANEOUS) IMPLANT
COVER SURGICAL LIGHT HANDLE (MISCELLANEOUS) ×2 IMPLANT
ELECT REM PT RETURN 15FT ADLT (MISCELLANEOUS) ×2 IMPLANT
GLOVE BIO SURGEON STRL SZ7.5 (GLOVE) IMPLANT
GLOVE BIOGEL PI IND STRL 7.5 (GLOVE) ×4 IMPLANT
GLOVE BIOGEL PI INDICATOR 7.5 (GLOVE) ×4
GOWN STRL REUS W/ TWL XL LVL3 (GOWN DISPOSABLE) ×1 IMPLANT
GOWN STRL REUS W/TWL XL LVL3 (GOWN DISPOSABLE) ×3 IMPLANT
GUIDEWIRE STR DUAL SENSOR (WIRE) ×2 IMPLANT
HOLDER FOLEY CATH W/STRAP (MISCELLANEOUS) IMPLANT
LOOP CUT BIPOLAR 24F LRG (ELECTROSURGICAL) IMPLANT
MANIFOLD NEPTUNE II (INSTRUMENTS) ×2 IMPLANT
PACK CYSTO (CUSTOM PROCEDURE TRAY) ×2 IMPLANT
SET ASPIRATION TUBING (TUBING) IMPLANT
SYR 30ML LL (SYRINGE) ×2 IMPLANT
SYRINGE IRR TOOMEY STRL 70CC (SYRINGE) ×2 IMPLANT
TUBING CONNECTING 10 (TUBING) ×2 IMPLANT
TUBING UROLOGY SET (TUBING) ×2 IMPLANT

## 2017-12-09 NOTE — ED Notes (Signed)
Report given to Sharyn Lull., RN, [facility].  Advised Vancomycin caused itching.

## 2017-12-09 NOTE — ED Notes (Signed)
Due to WBC count and fever, will prioritize rooming for patient and upgrade acuity

## 2017-12-09 NOTE — Progress Notes (Signed)
Brief note Pharmacy: meropenem  See note 5/18 by Ethlyn Gallery PharmD for details  Plan: Meropenem 1 Gm IV q8h F/u scr/cultures  Thanks,  Lorenza Evangelist 12/09/2017 11:53 PM

## 2017-12-09 NOTE — Op Note (Addendum)
PATIENT:  Jacob Gutierrez  Preoperative diagnosis:  1. Prostatic abscess    Postoperative diagnosis:  1. same   Procedure:  1. Cystoscopy 2. Transurethral resection of the prostate  Surgeon: Berniece Salines, M.D.  Resident: Janifer Adie.  Anesthesia: General  Complications: None  EBL: Minimal  Specimens: None  Indication: Jacob Gutierrez is a 32 y.o. male with AIDs and a 7cm prostatic abscess. After reviewing the management options for treatment, they have elected to proceed with the above surgical procedure(s). We have discussed the potential benefits and risks of the procedure, side effects of the proposed treatment, the likelihood of the patient achieving the goals of the procedure, and any potential problems that might occur during the procedure or recuperation. Informed consent has been obtained.  Findings:  -Normal anterior urethra,  -Small prostate with high riding bladder neck and slight mass-effect from underlying abscess -Normal bladder with no evidence of tumor or lesions -Uncomplicated roofing of prostatic abscess.  -Widely open prostate with large defect into abscess cavity. -No evidence or concern of perforation into the rectum. -Uncomplicated placement of 22 French three-way hematuria catheter over a wire  Description of procedure:   The patient was taken to the operating room and general anesthesia was induced.  The patient was placed in the dorsal lithotomy position, prepped and draped in the usual sterile fashion, and preoperative antibiotics were administered. A preoperative time-out was performed.   We began with a 54 French resectoscope with visual obturator with copious lubrication irrigation.  The anterior urethra was normal.  The prostate was relatively small with a high riding bladder neck and underlying mass effect from the known abscess.  We switched to a resectoscope with loop electrode.  We systematically resected the prostate focusing  our attention on the left aspect of the gland.  We entered into the abscess cavity with delivery of large volume of purulent material.  A finger was placed into the rectum to gently milk the abscess.  There was no evidence of perforation or defect in the rectum.  We continued to resect the prostate to obtain a widely open channel and opened the abscess defect so that it was clearly draining.  We then obtained excellent hemostasis with no evidence of bleeding at a low bladder volume with flow off.  Next a 22 [place] three-way hematuria catheter was placed into the bladder over a wire with the assistance of an 18 [place] Angiocath.  The draining urine was clear and the third port was capped.  Patient was awoken from anesthesia and recovered in the PACU actively  Plan:  -Admit to medicine for postoperative management of AIDS and sepsis -Continue Foley catheter to drainage, keep third port capped

## 2017-12-09 NOTE — ED Provider Notes (Signed)
[facility] EMERGENCY DEPARTMENT Provider Note   CSN: [id number] Arrival date & time: 12/09/17  1044     History   Chief Complaint Chief Complaint  Patient presents with  . Shortness of Breath    HPI ASRIEL WESTRUP is a 32 y.o. 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  Patient states that his throat started hurting and he has had difficulty swallowing that began a week ago.  Patient reports that it started off with some solids and states that he has not been able to tolerate solids for the last week.  Patient now reports that he is having trouble eating liquids also.  He states that the pain is worsened when attempting to swallow and he feels like he cannot breathe.  Patient also reports that he has been having some cough that is nonproductive.  Patient reports that he had several episodes of vomiting over the last few days.  Patient reports that at onset of symptoms, it appeared like a coffee-ground emesis.  He states that today, he is only had dry heaves.  He states that this morning, he was febrile at 102.0.  Patient states that he has had generalized abdominal pain with no focal point.  Patient had recently been seen for an abscess noted to his right groin.  Patient reports that he had had the abscess drained and was started on antibiotics which he states he did not complete.  Patient states that he is not currently on any antiviral medications.  Patient reports that he has not seen ID in the last 6 months.  He recent immobilization, prior history of DVT/PE, recent surgery, leg swelling, or long travel.   The history is provided by the patient.    Past Medical History:  Diagnosis Date  . 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 H/O coccidioidomycosis    pulmonary   . HIV disease (HCC)   . Past history of allergy to penicillin-type antibiotic 07-03-2012   desensitization  . PNA (pneumonia)   . Shigella gastroenteritis   . Syphilis    history /treated     Patient Active Problem List   Diagnosis Date Noted  . Cystitis   . Dehydration   . FUO (fever of unknown origin)   . Encephalopathy   . Shigella infection   . Norovirus   . E coli enteritis   . Diarrhea 03/05/2017  . AIDS (acquired immune deficiency syndrome) (HCC)   . Meningoencephalitis   . Proctitis   . Weight loss   . Homelessness   . 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 Syphilis contact, treated 01/02/2014  . Tobacco use disorder 01/02/2014  . Human immunodeficiency virus (HIV) disease (HCC) 01/01/2014    History reviewed. No pertinent surgical history.      Home Medications    Prior to Admission medications   Medication Sig Start Date End Date Taking? Authorizing Provider  acetaminophen (TYLENOL) 500 MG tablet Take 500-1,000 mg by mouth every 6 (six) hours as needed (for pain, fever, or headache).   Yes [provider]  cephALEXin (KEFLEX) 500 MG capsule Take 500 mg by mouth 2 (two) times daily. FOR 7 DAYS   Yes [provider]  famotidine (PEPCID AC) 10 MG chewable tablet Chew 10 mg by mouth 2 (two)  times daily as needed for heartburn.   Yes [provider]  sulfamethoxazole-trimethoprim (BACTRIM DS,SEPTRA DS) 800-160 MG tablet Take 1 tablet by mouth daily. Patient taking differently: Take 1 tablet by mouth 2 (two) times daily. FOR 10 DAYS 03/09/17 12/09/17 Yes Bland, Scott, DO  benzocaine (ORAJEL) 10 % mucosal gel Use as directed in the mouth or throat 4 (four) times daily as needed for mouth pain. Patient not taking: Reported on 12/09/2017 03/08/17   Marthenia Rolling, DO    Family History Family History  Problem Relation Age of Onset  . Hypertension Mother   . Lupus Maternal Grandmother     . Cancer Paternal Grandfather     Social History Social History   Tobacco Use  . Smoking status: Current Every Day Smoker    Packs/day: 0.50    Types: Cigarettes  . Smokeless tobacco: Never Used  . Tobacco comment: cutting back  Substance Use Topics  . Alcohol use: Yes    Alcohol/week: 0.5 oz    Types: 1 Standard drinks or equivalent per week    Comment: whiskey occasionally   . Drug use: Yes    Frequency: 1.0 times per week    Types: Marijuana     Allergies   Amoxicillin   Review of Systems Review of Systems  Constitutional: Positive for fever.  HENT: Positive for sore throat and trouble swallowing.   Respiratory: Positive for cough and shortness of breath.   Cardiovascular: Negative for chest pain.  Gastrointestinal: Positive for abdominal pain. Negative for nausea and vomiting.  Genitourinary: Negative for dysuria and hematuria.  Neurological: Negative for headaches.  All other systems reviewed and are negative.    Physical Exam Updated Vital Signs BP 116/76   Pulse 99   Temp 99 F (37.2 C) (Oral)   Resp 16   Ht  (1.854 m)   Wt 77.1 kg (170 lb)   SpO2 95%   BMI 22.43 kg/m   Physical Exam  Constitutional: He is oriented to person, place, and time. He appears well-developed and well-nourished. He has a sickly appearance.  Cachectic  HENT:  Head: Normocephalic and atraumatic.  Mouth/Throat: Oropharynx is clear and moist and mucous membranes are normal.  Scattered white plaques consistent with candidiasis noted to the roof of mouth, buccal mucosa.  Eyes: Pupils are equal, round, and reactive to light. Conjunctivae, EOM and lids are normal.  Neck: Full passive range of motion without pain.  Cardiovascular: Normal rate, regular rhythm, normal heart sounds and normal pulses. Exam reveals no gallop and no friction rub.  No murmur heard. Pulmonary/Chest: Effort normal and breath sounds normal.  Able speak in full sentences without any difficulty.  No  evidence of respiratory distress.  Abdominal: Soft. Normal appearance. There is tenderness in the suprapubic area. There is no rigidity and no guarding. Hernia confirmed negative in the right inguinal area and confirmed negative in the left inguinal area.  Genitourinary: Penis normal. Right testis shows no swelling and no tenderness. Left testis shows no swelling and no tenderness. Circumcised.  Genitourinary Comments: The exam was performed with a chaperone present. Normal male genitalia. No evidence of rash, ulcers or lesions.   Musculoskeletal: Normal range of motion.  Neurological: He is alert and oriented to person, place, and time. He displays atrophy.  Skin: Skin is warm and dry. Capillary refill takes less than 2 seconds.  2 cm wound noted to upper left thigh with some mild drainage noted.  No surrounding warmth, erythema.  Psychiatric:  He has a normal mood and affect. His speech is normal.  Nursing note and vitals reviewed.    ED Treatments / Results  Labs (all labs ordered are listed, but only abnormal results are displayed) Labs Reviewed  BASIC METABOLIC PANEL - Abnormal; Notable for the following components:      Result Value   Sodium 131 (*)    Chloride 97 (*)    Calcium 8.4 (*)    All other components within normal limits  CBC - Abnormal; Notable for the following components:   WBC 1.5 (*)    RBC 3.56 (*)    Hemoglobin 10.8 (*)    HCT 32.9 (*)    All other components within normal limits  I-STAT TROPONIN, ED  I-STAT CG4 LACTIC ACID, ED    EKG EKG Interpretation  Date/Time:  Saturday Dec 09 2017 11:16:25 EDT Ventricular Rate:  109 PR Interval:  132 QRS Duration: 92 QT Interval:  332 QTC Calculation: 447 R Axis:   5 Text Interpretation:  Sinus tachycardia Otherwise normal ECG no prior to compare with Confirmed by Meridee Score [phone]) on 12/09/2017 4:50:20 PM   Radiology Dg Chest 2 View  Result Date: 12/09/2017 CLINICAL DATA:  Cough with congestion and  sore throat as well as chest pain. EXAM: CHEST - 2 VIEW COMPARISON:  03/06/2017 FINDINGS: The heart size and mediastinal contours are within normal limits. Both lungs are clear. The visualized skeletal structures are unremarkable. IMPRESSION: No active cardiopulmonary disease. Electronically Signed   By: Elberta Fortis M.D.   On: 12/09/2017 11:58   Ct Abdomen Pelvis W Contrast  Result Date: 12/09/2017 CLINICAL DATA:  Cough, congestion, sore throat, vomiting. LEFT upper quadrant pain. History of HIV/aids and treated syphilis. EXAM: CT ABDOMEN AND PELVIS WITH CONTRAST TECHNIQUE: Multidetector CT imaging of the abdomen and pelvis was performed using the standard protocol following bolus administration of intravenous contrast. CONTRAST:  OMNIPAQUE IOHEXOL 300 MG/ML  SOLN COMPARISON:  CT abdomen and pelvis March 05, 2017 FINDINGS: LOWER CHEST: Mild suspected esophageal wall thickening, partially imaged. Lung bases are clear. Included heart size is normal. No pericardial effusion. HEPATOBILIARY: Liver and gallbladder are normal. PANCREAS: Normal. SPLEEN: Normal. ADRENALS/URINARY TRACT: Kidneys are orthotopic, demonstrating symmetric enhancement. Mild symmetric nephromegaly. No nephrolithiasis, hydronephrosis or solid renal masses. The unopacified ureters are normal in course and caliber. No urinary bladder is partially distended and unremarkable. Normal adrenal glands. STOMACH/BOWEL: Mild rectal wall thickening and edema. Mild amount of retained large bowel stool. No bowel obstruction. VASCULAR/LYMPHATIC: Aortoiliac vessels are normal in course and caliber. No lymphadenopathy by CT size criteria. Similar greater than expected number of mildly enlarged retroperitoneal, abdominal and inguinal lymph nodes. REPRODUCTIVE: Multilobulated 3.6 x 4.2 x 7.2 cm prostatic abscess, possibly loculated. OTHER: No intraperitoneal free fluid or free air. MUSCULOSKELETAL: Nonacute.  Mild L5-S1 degenerative disc. IMPRESSION: 1.  3.6 x 4.2 x 7.2 cm prostatic abscess, possibly loculated. 2. Mild proctitis and suspected esophagitis. 3. Mild nephromegaly and mild lymphadenopathy, findings associated with HIV. 4. Acute findings discussed with and reconfirmed by Dr.Veronica Guerrant on 12/09/2017 at 6:48 pm. Electronically Signed   By: Awilda Metro M.D.   On: 12/09/2017 18:50    Procedures Procedures (including critical care time)  Medications Ordered in ED Medications  vancomycin (VANCOCIN) IVPB 1000 mg/200 mL premix ( Intravenous Automatically Held 12/18/17 1900)  fluconazole (DIFLUCAN) IVPB 200 mg ( Intravenous Automatically Held 12/17/17 2000)  gentamicin (GARAMYCIN) 390 mg in dextrose 5 % 100 mL IVPB (has no administration  in time range)  lactated ringers infusion (has no administration in time range)  HYDROmorphone (DILAUDID) injection 0.25-0.5 mg (has no administration in time range)  meperidine (DEMEROL) injection 6.25-12.5 mg (has no administration in time range)  promethazine (PHENERGAN) injection 6.25-12.5 mg (has no administration in time range)  acetaminophen (TYLENOL) tablet 650 mg (650 mg Oral Given 12/09/17 1125)  iohexol (OMNIPAQUE) 300 MG/ML solution 100 mL (100 mLs Intravenous Contrast Given 12/09/17 1823)  sodium chloride 0.9 % bolus 1,000 mL (0 mLs Intravenous Stopped 12/09/17 2048)  vancomycin (VANCOCIN) IVPB 1000 mg/200 mL premix (0 mg Intravenous Stopped 12/09/17 2057)  vancomycin (VANCOCIN) 500 mg in sodium chloride 0.9 % 100 mL IVPB (0 mg Intravenous Stopped 12/09/17 2057)     Initial Impression / Assessment and Plan / ED Course  I have reviewed the triage vital signs and the nursing notes.  Pertinent labs & imaging results that were available during my care of the patient were reviewed by me and considered in my medical decision making (see chart for details).  Clinical Course as of Dec 09 2224  Sat Dec 09, 2017  3158 32 year old male with history of HIV and likely AIDS here with difficulty  swallowing eating and feeling like difficulty breathing secondary to throat neck chest pain.  He is not on retrovirals currently.  He is cachectic but nontoxic on exam.  We are discussing with infectious disease for recommendations regarding management.   [MB]    Clinical Course User Index [MB] Terrilee Files, MD    32 year old male past no history of HIV, last CD4 count was 10, who presents for evaluation of difficulty swallowing, vomiting, fever.  Reports associated difficulty breathing.  Reports he has not taken any antiretrovirals for the last year.  Last ID visit was 6 months ago.  On initial ED arrival, patient was febrile, tachycardic.  Initial labs ordered at triage.  Given oral findings, concerned about candidiasis, particularly esophagitis given patient's AID status.  He is able to swallow his own secretions and swallow liquids but has difficulty with solids.  Does not appear to be obstructed at this time.  Given abdominal tenderness, will plan for CT abdomen pelvis.  Fluids initiated.  BMP shows sodium 131.  Otherwise unremarkable.  CBC shows white blood cell count of 1.5. Hemoglobin is 10.8 and hematocrit is 32.9.  I-STAT troponin negative.  I-STAT lactic acid unremarkable.  Chest x-ray shows no acute abnormality.  EKG shows sinus sinus tachycardia.  Discussed with Dr. Ninetta Lights (ID) regarding symptoms.  Recommend starting patient on fluconazole.  CT abdomen pelvis shows a 3 x 4 x 7 cm prostate abscess possibly loculated.  Additionally, there is mild proctitis and suspected esophagitis.  Patient started on broad-spectrum antibiotics, fluconazole.  Will touch base with urology.  Will likely require admission.  Discussed patient with Dr. Ninetta Lights (ID).  Plan for consult during admission.  Recommend de-escalating from vancomycin and starting patient on cefepime.   Discussed with Dr. Al Corpus (Urology).  Regarding CT abdomen pelvis findings.  Recommends surgical intervention as soon as  possible.  He recommends medicine admission with urology consult with plans to taken to the OR.  Would like patient be transferred to [facility] so that surgery can take place.  Recommends ED to ED transfer.  I updated Dr. Al Corpus on the fact the patient had eaten applesauce approximately 45 minutes prior to our conversation.  He still recommends patient coming over to the ED for immediate surgical intervention.  Discussed with Dr. Criss Alvine  at [place] on ED.  Accepts patient for transfer.  Updated patient on plan.  Will need medical admission.  Final Clinical Impressions(s) / ED Diagnoses   Final diagnoses:  Prostate abscess  Esophagitis    ED Discharge Orders    None       Rosana Hoes 12/09/17 2228    Terrilee Files, MD 12/11/17 1139

## 2017-12-09 NOTE — Consult Note (Signed)
Urology Consult Note   Requesting Attending Physician:  Terrilee Files, MD Service Providing Consult: Urology  Consulting Attending: Marlou Porch    Gutierrez for Consult:  Prostatic Abscess  HPI: Jacob Gutierrez is seen in consultation for reasons noted above at the request of Terrilee Files, MD for evaluation of Prostatic abscess.  This is a 32 y.o. 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   Has had vague abdominal discomfort and throat pain for the past week. Became febrile this morning to 102 and presented to the [facility] emergency room.   Febrile to 102 and tachycardic on arrival, quickly defervesced.  Cr - 0.62 WBC - 1.5  CT shows a large 3.6 x 4.2 x 7.6 cm prostatic abscess with loculations.   Of note, the patient has HIV and recently progressed to AIDs status. He has been off HAART for 1 year because he lost his insurance.   Past Medical History: Past Medical History:  Diagnosis Date  . 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 H/O coccidioidomycosis    pulmonary   . HIV disease (HCC)   . Past history of allergy to penicillin-type antibiotic 07-03-2012   desensitization  . PNA (pneumonia)   . Shigella gastroenteritis   . Syphilis    history /treated     Past Surgical History:  History reviewed. No pertinent surgical history.  Medication: Current Facility-Administered Medications  Medication Dose Route Frequency Provider Last Rate Last Dose  . fluconazole (DIFLUCAN) IVPB 200 mg  200 mg Intravenous Q24H Claudie Leach, [place]      . vancomycin (VANCOCIN) 500 mg in sodium chloride 0.9 % 100 mL IVPB  500 mg Intravenous Once Claudie Leach, [place]      . vancomycin (VANCOCIN) IVPB 1000 mg/200 mL premix  1,000 mg Intravenous Once Maxwell Caul, PA-C      . [START ON 12/10/2017] vancomycin (VANCOCIN) IVPB 1000 mg/200 mL premix  1,000 mg Intravenous Q8H Claudie Leach, [facility]        Current Outpatient Medications  Medication Sig Dispense Refill  . acetaminophen (TYLENOL) 500 MG tablet Take 500-1,000 mg by mouth every 6 (six) hours as needed (for pain, fever, or headache).    . cephALEXin (KEFLEX) 500 MG capsule Take 500 mg by mouth 2 (two) times daily. FOR 7 DAYS    . famotidine (PEPCID AC) 10 MG chewable tablet Chew 10 mg by mouth 2 (two) times daily as needed for heartburn.    . sulfamethoxazole-trimethoprim (BACTRIM DS,SEPTRA DS) 800-160 MG tablet Take 1 tablet by mouth daily. (Patient taking differently: Take 1 tablet by mouth 2 (two) times daily. FOR 10 DAYS) 30 tablet 3  . benzocaine (ORAJEL) 10 % mucosal gel Use as directed in the mouth or throat 4 (four) times daily as needed for mouth pain. (Patient not taking: Reported on 12/09/2017) 5.3 g 0    Allergies: Allergies  Allergen Reactions  . Amoxicillin Other (See Comments)    From childhood: "I had a reaction when i was little." (??)    Social History: Social History   Tobacco Use  . Smoking status: Current Every Day Smoker    Packs/day: 0.50    Types: Cigarettes  . Smokeless tobacco: Never Used  . Tobacco comment: cutting back  Substance Use Topics  . Alcohol use: Yes    Alcohol/week: 0.5 oz    Types: 1 Standard drinks or  equivalent per week    Comment: whiskey occasionally   . Drug use: Yes    Frequency: 1.0 times per week    Types: Marijuana    Family History Family History  Problem Relation Age of Onset  . Hypertension Mother   . Lupus Maternal Grandmother   . Cancer Paternal Grandfather     Review of Systems 10 systems were reviewed and are negative except as noted specifically in the HPI.  Objective   Vital signs in last 24 hours: BP 115/74   Pulse 90   Temp 99.6 F (37.6 C) (Oral)   Resp 18   Ht  (1.854 m)   Wt 77.1 kg (170 lb)   SpO2 100%   BMI 22.43 kg/m   Physical Exam General: NAD, A&O, resting, appropriate HEENT: [place]/AT, EOMI, MMM Pulmonary: Normal work of  breathing Cardiovascular: HDS, adequate peripheral perfusion Abdomen: Soft, NTTP, nondistended. GU: DRE revealed a soft and boggy abscess  Extremities: warm and well perfused Neuro: Appropriate, no focal neurological deficits  Most Recent Labs: Lab Results  Component Value Date   WBC 1.5 (L) 12/09/2017   HGB 10.8 (L) 12/09/2017   HCT 32.9 (L) 12/09/2017   PLT 207 12/09/2017    Lab Results  Component Value Date   NA 131 (L) 12/09/2017   K 3.7 12/09/2017   CL 97 (L) 12/09/2017   CO2 27 12/09/2017   BUN 7 12/09/2017   CREATININE 0.62 12/09/2017   CALCIUM 8.4 (L) 12/09/2017    No results found for: INR, APTT   IMAGING: Dg Chest 2 View  Result Date: 12/09/2017 CLINICAL DATA:  Cough with congestion and sore throat as well as chest pain. EXAM: CHEST - 2 VIEW COMPARISON:  03/06/2017 FINDINGS: The heart size and mediastinal contours are within normal limits. Both lungs are clear. The visualized skeletal structures are unremarkable. IMPRESSION: No active cardiopulmonary disease. Electronically Signed   By: Elberta Fortis M.D.   On: 12/09/2017 11:58   Ct Abdomen Pelvis W Contrast  Result Date: 12/09/2017 CLINICAL DATA:  Cough, congestion, sore throat, vomiting. LEFT upper quadrant pain. History of HIV/aids and treated syphilis. EXAM: CT ABDOMEN AND PELVIS WITH CONTRAST TECHNIQUE: Multidetector CT imaging of the abdomen and pelvis was performed using the standard protocol following bolus administration of intravenous contrast. CONTRAST:  OMNIPAQUE IOHEXOL 300 MG/ML  SOLN COMPARISON:  CT abdomen and pelvis March 05, 2017 FINDINGS: LOWER CHEST: Mild suspected esophageal wall thickening, partially imaged. Lung bases are clear. Included heart size is normal. No pericardial effusion. HEPATOBILIARY: Liver and gallbladder are normal. PANCREAS: Normal. SPLEEN: Normal. ADRENALS/URINARY TRACT: Kidneys are orthotopic, demonstrating symmetric enhancement. Mild symmetric nephromegaly. No  nephrolithiasis, hydronephrosis or solid renal masses. The unopacified ureters are normal in course and caliber. No urinary bladder is partially distended and unremarkable. Normal adrenal glands. STOMACH/BOWEL: Mild rectal wall thickening and edema. Mild amount of retained large bowel stool. No bowel obstruction. VASCULAR/LYMPHATIC: Aortoiliac vessels are normal in course and caliber. No lymphadenopathy by CT size criteria. Similar greater than expected number of mildly enlarged retroperitoneal, abdominal and inguinal lymph nodes. REPRODUCTIVE: Multilobulated 3.6 x 4.2 x 7.2 cm prostatic abscess, possibly loculated. OTHER: No intraperitoneal free fluid or free air. MUSCULOSKELETAL: Nonacute.  Mild L5-S1 degenerative disc. IMPRESSION: 1. 3.6 x 4.2 x 7.2 cm prostatic abscess, possibly loculated. 2. Mild proctitis and suspected esophagitis. 3. Mild nephromegaly and mild lymphadenopathy, findings associated with HIV. 4. Acute findings discussed with and reconfirmed by Dr.LINDSEY LAYDEN on 12/09/2017 at 6:48  pm. Electronically Signed   By: Awilda Metro M.D.   On: 12/09/2017 18:50    ------  Assessment:  32 y.o. male with AIDS who presents with fever to 103. CT shows a large loculated prostatic abscess.   Discussed options and importance of local source control. Risk of prostatic unroofing discussed, including but not limited to infection, bleeding, damage to associated structures, fistula and stricture). Benefits of the procedure were highlighted.   Recommendations: - Proceed with TUR of prostate and abscess unroofing  - Appreciate medicine teams assistance with post operative management of infection  - Will require outpatient follow up with urology for catheter removal and potential imaging    Thank you for this consult. Please contact the urology consult pager with any further questions/concerns.

## 2017-12-09 NOTE — ED Notes (Signed)
Pt informed we are trying to get him back to a room as quickly as we can. Pt verbalized understanding.

## 2017-12-09 NOTE — ED Triage Notes (Signed)
Patient presents to ED for assessment of cough, congestion, chills, black sputum, sore throat, difficulty swallowing, general malaise.  States chest pain with coughing, and "feeling like my throat is going to close" when he is coughing so hard.  States he also has vomiting with coughing.  Patient states one nostril is clogged.

## 2017-12-09 NOTE — ED Notes (Signed)
Pt has in beard on neck lighter colored patches.  Pt states this is not new.

## 2017-12-09 NOTE — Progress Notes (Signed)
Pharmacy Antibiotic Note  Jacob Gutierrez is a 32 y.o. male admitted on 12/09/2017 with cough, congestion, chills, black sputum and sore throat.  Pharmacy has been consulted for Vancomycin and Fluconazole dosing.  He is 73 inches and weighs 77kg.  He has normal renal function, WBC 1.5 and LA of 0.52.  PMH:  AIDS, Shigella infection, Ecoli enteritis, Syphilis, Tobacco use, dysplasia of anus (history obtained from [facility] records).  Plan: Fluconazole  IV q24 Vancomycin 1500 mg x 1 then begin maintenance of 1gm every 8 hours Will f/u micro data, clinical response, LOT and plans for de-escalation  Height:  (185.4 cm) Weight: 170 lb (77.1 kg) IBW/kg (Calculated) : 79.9  Temp (24hrs), Avg:100.7 F (38.2 C), Min:99 F (37.2 C), Max:102.1 F (38.9 C)  Recent Labs  Lab 12/09/17 1130 12/09/17 1618  WBC 1.5*  --   CREATININE 0.62  --   LATICACIDVEN  --  0.52    Estimated Creatinine Clearance: 141.9 mL/min (by C-G formula based on SCr of 0.62 mg/dL).    Allergies  Allergen Reactions  . Amoxicillin Other (See Comments)    From childhood: "I had a reaction when i was little." (??)    Antimicrobials this admission: 5/18 Vanc >>  5/18 Fluconazole >>   Nadara Mustard, PharmD., MS Clinical Pharmacist Pager:  [phone] Thank you for allowing pharmacy to be part of this patients care team. 12/09/2017 7:16 PM

## 2017-12-09 NOTE — Anesthesia Procedure Notes (Signed)
Procedure Name: Intubation Date/Time: 12/09/2017 10:19 PM Performed by: Lissa Morales, CRNA Pre-anesthesia Checklist: Patient identified, Emergency Drugs available, Suction available and Patient being monitored Patient Re-evaluated:Patient Re-evaluated prior to induction Oxygen Delivery Method: Circle system utilized Preoxygenation: Pre-oxygenation with 100% oxygen Induction Type: IV induction, Rapid sequence and Cricoid Pressure applied Ventilation: Mask ventilation without difficulty Laryngoscope Size: Mac and 4 Grade View: Grade II Tube type: Oral Number of attempts: 1 Airway Equipment and Method: Stylet and Oral airway Placement Confirmation: ETT inserted through vocal cords under direct vision,  positive ETCO2 and breath sounds checked- equal and bilateral Secured at: 22 cm Tube secured with: Tape Dental Injury: Teeth and Oropharynx as per pre-operative assessment

## 2017-12-09 NOTE — Anesthesia Preprocedure Evaluation (Addendum)
Anesthesia Evaluation  Patient identified by MRN, date of birth, ID band Patient awake    Reviewed: Allergy & Precautions, NPO status , Patient's Chart, lab work & pertinent test results  Airway Mallampati: I  TM Distance: >3 FB Neck ROM: Full    Dental  (+) Teeth Intact, Chipped   Pulmonary Current Smoker,    breath sounds clear to auscultation       Cardiovascular negative cardio ROS   Rhythm:Regular Rate:Normal     Neuro/Psych    GI/Hepatic Neg liver ROS, GERD  Medicated,  Endo/Other  negative endocrine ROS  Renal/GU negative Renal ROS     Musculoskeletal   Abdominal Normal abdominal exam  (+)   Peds  Hematology negative hematology ROS (+)   Anesthesia Other Findings - HIV   Reproductive/Obstetrics                            Lab Results  Component Value Date   WBC 1.5 (L) 12/09/2017   HGB 10.8 (L) 12/09/2017   HCT 32.9 (L) 12/09/2017   MCV 92.4 12/09/2017   PLT 207 12/09/2017   Lab Results  Component Value Date   CREATININE 0.62 12/09/2017   BUN 7 12/09/2017   NA 131 (L) 12/09/2017   K 3.7 12/09/2017   CL 97 (L) 12/09/2017   CO2 27 12/09/2017   No results found for: INR, PROTIME  EKG: sinus tachycardia.   Anesthesia Physical Anesthesia Plan  ASA: III and emergent  Anesthesia Plan: General   Post-op Pain Management:    Induction: Intravenous, Rapid sequence and Cricoid pressure planned  PONV Risk Score and Plan: 2 and Ondansetron and Midazolam  Airway Management Planned: Oral ETT  Additional Equipment: None  Intra-op Plan:   Post-operative Plan: Extubation in OR  Informed Consent: I have reviewed the patients History and Physical, chart, labs and discussed the procedure including the risks, benefits and alternatives for the proposed anesthesia with the patient or authorized representative who has indicated his/her understanding and acceptance.   Dental  advisory given  Plan Discussed with: CRNA  Anesthesia Plan Comments:        Anesthesia Quick Evaluation

## 2017-12-09 NOTE — Plan of Care (Signed)
32 yo with history of HIV-AIDS untreated Presented with shortness of breath difficulty swallowing and abdominal pain ID has been consulted started on fluconazole for presumed fungal esophagitis CT scan showed prostatic abscess broaden antibiotics added and urology consulted patient is taken to the OR.  Discussed with urology please notify hospitalist on-call when procedure is done and if patient is stable and not requiring intubation or pressors will be happy to admit. Jacob Gutierrez 10:21 PM

## 2017-12-09 NOTE — ED Notes (Signed)
Per Elnita Maxwell, RN - OR, take patient directly to PACU.

## 2017-12-09 NOTE — ED Notes (Signed)
Vancomycin has infused.  Pt c/o itching at this time.  States did not have itching prior to Vancomycin.

## 2017-12-10 ENCOUNTER — Encounter (HOSPITAL_COMMUNITY): Payer: Self-pay | Admitting: Urology

## 2017-12-10 DIAGNOSIS — K5900 Constipation, unspecified: Secondary | ICD-10-CM

## 2017-12-10 DIAGNOSIS — I959 Hypotension, unspecified: Secondary | ICD-10-CM

## 2017-12-10 DIAGNOSIS — R112 Nausea with vomiting, unspecified: Secondary | ICD-10-CM

## 2017-12-10 DIAGNOSIS — N410 Acute prostatitis: Secondary | ICD-10-CM

## 2017-12-10 DIAGNOSIS — E876 Hypokalemia: Secondary | ICD-10-CM

## 2017-12-10 DIAGNOSIS — F17210 Nicotine dependence, cigarettes, uncomplicated: Secondary | ICD-10-CM

## 2017-12-10 DIAGNOSIS — R042 Hemoptysis: Secondary | ICD-10-CM

## 2017-12-10 DIAGNOSIS — B20 Human immunodeficiency virus [HIV] disease: Secondary | ICD-10-CM

## 2017-12-10 DIAGNOSIS — Z8614 Personal history of Methicillin resistant Staphylococcus aureus infection: Secondary | ICD-10-CM

## 2017-12-10 DIAGNOSIS — R300 Dysuria: Secondary | ICD-10-CM

## 2017-12-10 DIAGNOSIS — Z8619 Personal history of other infectious and parasitic diseases: Secondary | ICD-10-CM

## 2017-12-10 DIAGNOSIS — Z9119 Patient's noncompliance with other medical treatment and regimen: Secondary | ICD-10-CM

## 2017-12-10 DIAGNOSIS — B370 Candidal stomatitis: Secondary | ICD-10-CM

## 2017-12-10 DIAGNOSIS — A419 Sepsis, unspecified organism: Principal | ICD-10-CM

## 2017-12-10 DIAGNOSIS — R1310 Dysphagia, unspecified: Secondary | ICD-10-CM

## 2017-12-10 DIAGNOSIS — N412 Abscess of prostate: Secondary | ICD-10-CM

## 2017-12-10 DIAGNOSIS — R6520 Severe sepsis without septic shock: Secondary | ICD-10-CM

## 2017-12-10 DIAGNOSIS — K209 Esophagitis, unspecified: Secondary | ICD-10-CM

## 2017-12-10 LAB — URINALYSIS, ROUTINE W REFLEX MICROSCOPIC: Non Squamous Epithelial: >50 — AB

## 2017-12-10 LAB — BASIC METABOLIC PANEL
Anion gap: 10 (ref 5–15)
BUN: 9 mg/dL (ref 6–20)
CALCIUM: 7.7 mg/dL — AB (ref 8.9–10.3)
CHLORIDE: 100 mmol/L — AB (ref 101–111)
CO2: 21 mmol/L — ABNORMAL LOW (ref 22–32)
Creatinine, Ser: 0.95 mg/dL (ref 0.61–1.24)
GFR calc Af Amer: >60 mL/min (ref >60)
GFR calc non Af Amer: >60 mL/min (ref >60)
Glucose, Bld: 107 mg/dL — ABNORMAL HIGH (ref 65–99)
POTASSIUM: 2.9 mmol/L — AB (ref 3.5–5.1)
Sodium: 131 mmol/L — ABNORMAL LOW (ref 135–145)

## 2017-12-10 LAB — CBC
HEMATOCRIT: 29.7 % — AB (ref 39.0–52.0)
Hemoglobin: 9.9 g/dL — ABNORMAL LOW (ref 13.0–17.0)
MCH: 30.3 pg (ref 26.0–34.0)
MCHC: 33.3 g/dL (ref 30.0–36.0)
MCV: 90.8 fL (ref 78.0–100.0)
PLATELETS: 128 10*3/uL — AB (ref 150–400)
RBC: 3.27 MIL/uL — ABNORMAL LOW (ref 4.22–5.81)
RDW: 14.3 % (ref 11.5–15.5)
WBC: 1.5 10*3/uL — ABNORMAL LOW (ref 4.0–10.5)

## 2017-12-10 LAB — MAGNESIUM: Magnesium: 1.3 mg/dL — ABNORMAL LOW (ref 1.7–2.4)

## 2017-12-10 LAB — MRSA PCR SCREENING: MRSA by PCR: NEGATIVE

## 2017-12-10 LAB — LACTIC ACID, PLASMA
LACTIC ACID, VENOUS: 1.6 mmol/L (ref 0.5–1.9)
LACTIC ACID, VENOUS: 2.4 mmol/L — AB (ref 0.5–1.9)

## 2017-12-10 LAB — CORTISOL: CORTISOL PLASMA: 16.3 ug/dL

## 2017-12-10 MED ORDER — GI COCKTAIL ~~LOC~~
30.0000 mL | Freq: Three times a day (TID) | ORAL | Status: DC | PRN
  Filled 2017-12-10: qty 30

## 2017-12-10 MED ORDER — HYDROCORTISONE NA SUCCINATE PF 100 MG IJ SOLR
50.0000 mg | Freq: Four times a day (QID) | INTRAMUSCULAR | Status: DC
  Administered 2017-12-10 – 2017-12-12 (×7): 50 mg via INTRAVENOUS
  Filled 2017-12-10 (×7): qty 2

## 2017-12-10 MED ORDER — LACTATED RINGERS IV SOLN
INTRAVENOUS | Status: DC | PRN
  Administered 2017-12-09: 23:00:00 via INTRAVENOUS

## 2017-12-10 MED ORDER — BICTEGRAVIR-EMTRICITAB-TENOFOV 50-200-25 MG PO TABS
1.0000 | ORAL_TABLET | Freq: Every day | ORAL | Status: DC
  Administered 2017-12-10 – 2017-12-14 (×5): 1 via ORAL
  Filled 2017-12-10 (×6): qty 1

## 2017-12-10 MED ORDER — ACETAMINOPHEN 325 MG PO TABS
650.0000 mg | ORAL_TABLET | Freq: Four times a day (QID) | ORAL | Status: DC | PRN
  Administered 2017-12-10 – 2017-12-14 (×5): 650 mg via ORAL
  Filled 2017-12-10 (×5): qty 2

## 2017-12-10 MED ORDER — ONDANSETRON HCL 4 MG/2ML IJ SOLN
4.0000 mg | Freq: Four times a day (QID) | INTRAMUSCULAR | Status: DC | PRN
  Administered 2017-12-10 – 2017-12-13 (×2): 4 mg via INTRAVENOUS
  Filled 2017-12-10 (×2): qty 2

## 2017-12-10 MED ORDER — SODIUM CHLORIDE 0.9 % IV BOLUS
1000.0000 mL | Freq: Once | INTRAVENOUS | Status: AC
  Administered 2017-12-10: 1000 mL via INTRAVENOUS

## 2017-12-10 MED ORDER — MAGNESIUM SULFATE 4 GM/100ML IV SOLN
4.0000 g | Freq: Once | INTRAVENOUS | Status: AC
  Administered 2017-12-10: 4 g via INTRAVENOUS
  Filled 2017-12-10 (×2): qty 100

## 2017-12-10 MED ORDER — SODIUM CHLORIDE 0.9 % IV SOLN
1.0000 g | Freq: Three times a day (TID) | INTRAVENOUS | Status: DC
  Administered 2017-12-10 – 2017-12-12 (×7): 1 g via INTRAVENOUS
  Filled 2017-12-10 (×8): qty 1

## 2017-12-10 MED ORDER — OXYBUTYNIN CHLORIDE 5 MG PO TABS: 5.0000 mg | ORAL_TABLET | Freq: Three times a day (TID) | ORAL | Status: DC | PRN

## 2017-12-10 MED ORDER — SODIUM CHLORIDE 0.9 % IV SOLN
INTRAVENOUS | Status: DC
  Administered 2017-12-10 – 2017-12-11 (×3): via INTRAVENOUS

## 2017-12-10 MED ORDER — ONDANSETRON HCL 4 MG PO TABS
4.0000 mg | ORAL_TABLET | Freq: Four times a day (QID) | ORAL | Status: DC | PRN
  Filled 2017-12-10: qty 1

## 2017-12-10 MED ORDER — FAMOTIDINE IN NACL 20-0.9 MG/50ML-% IV SOLN
20.0000 mg | Freq: Two times a day (BID) | INTRAVENOUS | Status: DC
  Administered 2017-12-10 (×2): 20 mg via INTRAVENOUS
  Filled 2017-12-10 (×2): qty 50

## 2017-12-10 MED ORDER — MAGIC MOUTHWASH W/LIDOCAINE
5.0000 mL | Freq: Three times a day (TID) | ORAL | Status: DC | PRN
Start: 2017-12-10 — End: 2017-12-15
  Administered 2017-12-10 (×2): 5 mL via ORAL
  Filled 2017-12-10 (×3): qty 5

## 2017-12-10 MED ORDER — ACETAMINOPHEN 650 MG RE SUPP
650.0000 mg | Freq: Four times a day (QID) | RECTAL | Status: DC | PRN
  Filled 2017-12-10: qty 1

## 2017-12-10 MED ORDER — POTASSIUM CHLORIDE CRYS ER 20 MEQ PO TBCR
40.0000 meq | EXTENDED_RELEASE_TABLET | ORAL | Status: AC
  Administered 2017-12-10 (×2): 40 meq via ORAL
  Filled 2017-12-10 (×2): qty 2

## 2017-12-10 NOTE — Progress Notes (Signed)
CRITICAL VALUE ALERT  Critical Value:  Lactic acid 2.4  Date & Time Notied:  1410, 12/10/2017  Provider Notified: Craige Cotta   Orders Received/Actions taken: paged MD, awaiting orders

## 2017-12-10 NOTE — Consult Note (Signed)
PULMONARY / CRITICAL CARE MEDICINE   Name: Jacob Gutierrez MRN: [id number] DOB: 07/01/1986    ADMISSION DATE:  12/09/2017 CONSULTATION DATE:  12/10/2017   REFERRING MD:  Dr. Waymon Amato, [facility]  CHIEF COMPLAINT:  Malaise  HISTORY OF PRESENT ILLNESS:   32 yo male with HIV presented with malaise, fever 102F, dyspnea, dysphagia, abdominal pain.  Found to have prostatic abscess and sepsis.  Urology consulted and had TURP 5/18.  Hypotensive post op and PCCM asked to assist with sepsis management.  He denies chest pain, dyspnea, nausea.  Still sore when he tries to swallow.  HR has come down with fluid boluses.  Making urine.  PAST MEDICAL HISTORY :  He  has a past medical history of Anal dysplasia (07-26-2012), 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 [facility]), H/O coccidioidomycosis, HIV disease (HCC), Past history of allergy to penicillin-type antibiotic (07-03-2012), PNA (pneumonia), Shigella gastroenteritis, and Syphilis.  PAST SURGICAL HISTORY: He  has a past surgical history that includes Transurethral resection of prostate (N/A, 12/09/2017).  Allergies  Allergen Reactions  . Amoxicillin Other (See Comments)    From childhood: "I had a reaction when i was little." (??)    No current facility-administered medications on file prior to encounter.    Current Outpatient Medications on File Prior to Encounter  Medication Sig  . acetaminophen (TYLENOL) 500 MG tablet Take 500-1,000 mg by mouth every 6 (six) hours as needed (for pain, fever, or headache).  . cephALEXin (KEFLEX) 500 MG capsule Take 500 mg by mouth 2 (two) times daily. FOR 7 DAYS  . famotidine (PEPCID AC) 10 MG chewable tablet Chew 10 mg by mouth 2 (two) times daily as needed for heartburn.  . sulfamethoxazole-trimethoprim (BACTRIM DS,SEPTRA DS) 800-160 MG tablet Take 1 tablet by mouth daily. (Patient taking differently: Take 1 tablet by mouth 2 (two) times daily. FOR 10 DAYS)  . benzocaine (ORAJEL) 10 % mucosal gel Use as directed in  the mouth or throat 4 (four) times daily as needed for mouth pain. (Patient not taking: Reported on 12/09/2017)    FAMILY HISTORY:  His indicated that his mother is alive. He indicated that the status of his maternal grandmother is unknown. He indicated that the status of his paternal grandfather is unknown.   SOCIAL HISTORY: He  reports that he has been smoking cigarettes.  He has been smoking about 0.50 packs per day. He has never used smokeless tobacco. He reports that he drinks about 0.5 oz of alcohol per week. He reports that he has current or past drug history. Drug: Marijuana. Frequency: 1.00 time per week.  REVIEW OF SYSTEMS:   12 point ROS negative except above.  SUBJECTIVE:   VITAL SIGNS: BP (!) 77/33   Pulse (!) 118   Temp (!) 102.4 F (39.1 C) (Oral)   Resp (!) 22   Ht  (1.854 m)   Wt 170 lb (77.1 kg)   SpO2 94%   BMI 22.43 kg/m   INTAKE / OUTPUT: I/O last 3 completed shifts: In: 3800 [I.V.:2000; IV Piggyback:1800] Out: 540 [Urine:490; Blood:50]  PHYSICAL EXAMINATION:  General - cachectic Eyes - pupils reactive ENT - no sinus tenderness, no oral exudate, no LAN Cardiac - regular, tachycardic, no murmur Chest - no wheeze, rales Abd - soft, non tender Ext - decreased muscle bulk Skin - no rashes Neuro - follows commands   LABS:  BMET Recent Labs  Lab 12/09/17 1130 12/10/17 0340  NA 131* 131*  K 3.7 2.9*  CL 97* 100*  CO2 27 21*  BUN 7 9  CREATININE 0.62 0.95  GLUCOSE 84 107*    Electrolytes Recent Labs  Lab 12/09/17 1130 12/10/17 0340  CALCIUM 8.4* 7.7*  MG  --  1.3*    CBC Recent Labs  Lab 12/09/17 1130 12/10/17 0340  WBC 1.5* 1.5*  HGB 10.8* 9.9*  HCT 32.9* 29.7*  PLT 207 128*    Coag's No results for input(s): APTT, INR in the last 168 hours.  Sepsis Markers Recent Labs  Lab 12/09/17 1618  LATICACIDVEN 0.52    ABG No results for input(s): PHART, PCO2ART, PO2ART in the last 168 hours.  Liver Enzymes No  results for input(s): AST, ALT, ALKPHOS, BILITOT, ALBUMIN in the last 168 hours.  Cardiac Enzymes No results for input(s): TROPONINI, PROBNP in the last 168 hours.  Glucose No results for input(s): GLUCAP in the last 168 hours.  Imaging Dg Chest 2 View  Result Date: 12/09/2017 CLINICAL DATA:  Cough with congestion and sore throat as well as chest pain. EXAM: CHEST - 2 VIEW COMPARISON:  03/06/2017 FINDINGS: The heart size and mediastinal contours are within normal limits. Both lungs are clear. The visualized skeletal structures are unremarkable. IMPRESSION: No active cardiopulmonary disease. Electronically Signed   By: Elberta Fortis M.D.   On: 12/09/2017 11:58   Ct Abdomen Pelvis W Contrast  Result Date: 12/09/2017 CLINICAL DATA:  Cough, congestion, sore throat, vomiting. LEFT upper quadrant pain. History of HIV/aids and treated syphilis. EXAM: CT ABDOMEN AND PELVIS WITH CONTRAST TECHNIQUE: Multidetector CT imaging of the abdomen and pelvis was performed using the standard protocol following bolus administration of intravenous contrast. CONTRAST:  OMNIPAQUE IOHEXOL 300 MG/ML  SOLN COMPARISON:  CT abdomen and pelvis March 05, 2017 FINDINGS: LOWER CHEST: Mild suspected esophageal wall thickening, partially imaged. Lung bases are clear. Included heart size is normal. No pericardial effusion. HEPATOBILIARY: Liver and gallbladder are normal. PANCREAS: Normal. SPLEEN: Normal. ADRENALS/URINARY TRACT: Kidneys are orthotopic, demonstrating symmetric enhancement. Mild symmetric nephromegaly. No nephrolithiasis, hydronephrosis or solid renal masses. The unopacified ureters are normal in course and caliber. No urinary bladder is partially distended and unremarkable. Normal adrenal glands. STOMACH/BOWEL: Mild rectal wall thickening and edema. Mild amount of retained large bowel stool. No bowel obstruction. VASCULAR/LYMPHATIC: Aortoiliac vessels are normal in course and caliber. No lymphadenopathy by CT size  criteria. Similar greater than expected number of mildly enlarged retroperitoneal, abdominal and inguinal lymph nodes. REPRODUCTIVE: Multilobulated 3.6 x 4.2 x 7.2 cm prostatic abscess, possibly loculated. OTHER: No intraperitoneal free fluid or free air. MUSCULOSKELETAL: Nonacute.  Mild L5-S1 degenerative disc. IMPRESSION: 1. 3.6 x 4.2 x 7.2 cm prostatic abscess, possibly loculated. 2. Mild proctitis and suspected esophagitis. 3. Mild nephromegaly and mild lymphadenopathy, findings associated with HIV. 4. Acute findings discussed with and reconfirmed by Dr.LINDSEY LAYDEN on 12/09/2017 at 6:48 pm. Electronically Signed   By: Awilda Metro M.D.   On: 12/09/2017 18:50     STUDIES:  CT abd/pelvis 5/18 >> esophageal thickening, atrophic kidneys, 7.2 cm prostatic abscess  CULTURES: Prostate abscess 5/18 >> Blood 5/19 >>   ANTIBIOTICS: Gentamycin 5/18 >>  Vancomycin 5/18 >> Meropenem 5/18 >> Diflucan 5/18 >>   SIGNIFICANT EVENTS: 5/18 admit, TURP  LINES/TUBES:  DISCUSSION: 32 yo male with sepsis from prostatic abscess, esophageal candidiasis, hx of HIV with last CD4 count 10 from 03/06/17.  Seems to be responding to fluid, and plenty of room to work with to give fluid before jumping to pressor agents.  ASSESSMENT / PLAN:  Sepsis from prostatic abscess s/p cystoscopy with TURP. - post op care per urology - continue ABx - continue IV fluids >> will give additional 1 liter bolus  Esophageal candidiasis. - continue diflucan - magic mouth wash prn  HIV/AIDS. - ID consulted by [facility]  Hypokalemia, hypomagnesemia. - replace as needed  Cachexia. - optimize nutrition  Leukopenia from sepsis. Anemia, thrombocytopenia of critical illness. - f/u CBC  DVT prophylaxis - SCD SUP - pepcid Nutrition - regular diet Goals of care - full code  CC time 34 minutes  Updated pt's family at bedside  Coralyn Helling, MD [facility] Pulmonary/Critical Care 12/10/2017, 11:21 AM

## 2017-12-10 NOTE — Progress Notes (Signed)
Pt. BP continuously soft, MD Sood paged, 4 Liter boluses given, 5th Liter bolus starting now. Will continue to monitor pt.

## 2017-12-10 NOTE — Anesthesia Postprocedure Evaluation (Signed)
Anesthesia Post Note  Patient: JASN XIA  Procedure(s) Performed: TRANSURETHRAL RESECTION OF THE PROSTATE (TURP) (N/A )     Patient location during evaluation: PACU Anesthesia Type: General Level of consciousness: awake and alert Pain management: pain level controlled Vital Signs Assessment: post-procedure vital signs reviewed and stable Respiratory status: spontaneous breathing, nonlabored ventilation, respiratory function stable and patient connected to nasal cannula oxygen Cardiovascular status: blood pressure returned to baseline and stable Postop Assessment: no apparent nausea or vomiting Anesthetic complications: no    Last Vitals:  Vitals:   12/10/17 0030 12/10/17 0045  BP:    Pulse:    Resp: 14   Temp:  (!) 38.1 C  SpO2:      Last Pain:  Vitals:   12/10/17 0030  TempSrc:   PainSc: 0-No pain                 Shelton Silvas

## 2017-12-10 NOTE — Transfer of Care (Addendum)
Immediate Anesthesia Transfer of Care Note  Patient: Jacob Gutierrez  Procedure(s) Performed: TRANSURETHRAL RESECTION OF THE PROSTATE (TURP) (N/A )  Patient Location: PACU  Anesthesia Type:General  Level of Consciousness: awake, alert , oriented and patient cooperative  Airway & Oxygen Therapy: Patient Spontanous Breathing and Patient connected to face mask oxygen  Post-op Assessment: Report given to RN, Post -op Vital signs reviewed and stable and Patient moving all extremities X 4  Post vital signs: stable  Last Vitals:  Vitals Value Taken Time  BP 115/71 12/10/2017 12:00 AM  Temp    Pulse 121 12/10/2017 12:11 AM  Resp 14 12/10/2017 12:11 AM  SpO2 88 % 12/10/2017 12:11 AM  Vitals shown include unvalidated device data.  Last Pain:  Vitals:   12/10/17 0000  TempSrc:   PainSc: 0-No pain         Complications: No apparent anesthesia complications

## 2017-12-10 NOTE — Progress Notes (Signed)
Urology Progress Note   1 Day Post-Op  Subjective: Feeling better Catheter draining well - 500cc of cherry urine overnight, no clots Tachycardic, WBC 1.5   Objective: Vital signs in last 24 hours: Temp:  [99 F (37.2 C)-102.1 F (38.9 C)] 100.5 F (38.1 C) (05/19 0045) Pulse Rate:  [88-128] 125 (05/19 0600) Resp:  [11-19] 19 (05/19 0600) BP: (107-123)/(53-82) 114/53 (05/19 0600) SpO2:  [91 %-100 %] 91 % (05/19 0600) Weight:  [77.1 kg (170 lb)] 77.1 kg (170 lb) (05/18 1810)  Intake/Output from previous day: 05/18 0701 - 05/19 0700 In: 3800 [I.V.:2000; IV Piggyback:1800] Out: 540 [Urine:490; Blood:50] Intake/Output this shift: No intake/output data recorded.  Physical Exam:  General: Alert and oriented CV: RRR Lungs: Clear Abdomen: Soft, nontender GU: Foley in place draining cherry urine, third port capped  Ext: NT, No erythema  Lab Results: Recent Labs    12/09/17 1130 12/10/17 0340  HGB 10.8* 9.9*  HCT 32.9* 29.7*   BMET Recent Labs    12/09/17 1130 12/10/17 0340  NA 131* 131*  K 3.7 2.9*  CL 97* 100*  CO2 27 21*  GLUCOSE 84 107*  BUN 7 9  CREATININE 0.62 0.95  CALCIUM 8.4* 7.7*     Studies/Results: Dg Chest 2 View  Result Date: 12/09/2017 CLINICAL DATA:  Cough with congestion and sore throat as well as chest pain. EXAM: CHEST - 2 VIEW COMPARISON:  03/06/2017 FINDINGS: The heart size and mediastinal contours are within normal limits. Both lungs are clear. The visualized skeletal structures are unremarkable. IMPRESSION: No active cardiopulmonary disease. Electronically Signed   By: Elberta Fortis M.D.   On: 12/09/2017 11:58   Ct Abdomen Pelvis W Contrast  Result Date: 12/09/2017 CLINICAL DATA:  Cough, congestion, sore throat, vomiting. LEFT upper quadrant pain. History of HIV/aids and treated syphilis. EXAM: CT ABDOMEN AND PELVIS WITH CONTRAST TECHNIQUE: Multidetector CT imaging of the abdomen and pelvis was performed using the standard protocol  following bolus administration of intravenous contrast. CONTRAST:  OMNIPAQUE IOHEXOL 300 MG/ML  SOLN COMPARISON:  CT abdomen and pelvis March 05, 2017 FINDINGS: LOWER CHEST: Mild suspected esophageal wall thickening, partially imaged. Lung bases are clear. Included heart size is normal. No pericardial effusion. HEPATOBILIARY: Liver and gallbladder are normal. PANCREAS: Normal. SPLEEN: Normal. ADRENALS/URINARY TRACT: Kidneys are orthotopic, demonstrating symmetric enhancement. Mild symmetric nephromegaly. No nephrolithiasis, hydronephrosis or solid renal masses. The unopacified ureters are normal in course and caliber. No urinary bladder is partially distended and unremarkable. Normal adrenal glands. STOMACH/BOWEL: Mild rectal wall thickening and edema. Mild amount of retained large bowel stool. No bowel obstruction. VASCULAR/LYMPHATIC: Aortoiliac vessels are normal in course and caliber. No lymphadenopathy by CT size criteria. Similar greater than expected number of mildly enlarged retroperitoneal, abdominal and inguinal lymph nodes. REPRODUCTIVE: Multilobulated 3.6 x 4.2 x 7.2 cm prostatic abscess, possibly loculated. OTHER: No intraperitoneal free fluid or free air. MUSCULOSKELETAL: Nonacute.  Mild L5-S1 degenerative disc. IMPRESSION: 1. 3.6 x 4.2 x 7.2 cm prostatic abscess, possibly loculated. 2. Mild proctitis and suspected esophagitis. 3. Mild nephromegaly and mild lymphadenopathy, findings associated with HIV. 4. Acute findings discussed with and reconfirmed by Dr.LINDSEY LAYDEN on 12/09/2017 at 6:48 pm. Electronically Signed   By: Awilda Metro M.D.   On: 12/09/2017 18:50    Assessment/Plan:  32 y.o. male s/p unroofing of prostatic abscess.  Overall doing well post-op.   - Continue foley to drainage  - Continue BS abx, and narrow as culture data ( tissue and intraop urine)  become available  - Oxybutynin  TID PRN added for bladder spasms  - OK for nursing to flush foley PRN for clots  -  Please call urology if catheter becomes obstructed   Dispo: Per primary team   LOS: 1 day   Sydnee Levans, MD 12/10/2017, 7:20 AM

## 2017-12-10 NOTE — Consult Note (Addendum)
[facility] for Infectious Disease    Date of Admission:  12/09/2017   Total days of antibiotics: 1 vanco/cefepime               Reason for Consult: AIDS, prostatic abscess    Referring Provider: [email or web address]   Assessment: AIDS Thrush  Prostatic abscess Leukopenia Non-compliance  Plan: 1. Continue current anbx 2. Await cx 3. Resume biktarvy 4. Appreciate uro eval 5. Repeat CD4 6. Appreciate CCM assistance 7. Suspect leukopenia from sepsis and AIDS. Monitor ANC  Comment- I encouraged to f/u with ID clinic when he is out of meds, states he ran out of medicaid.  Per hospitalists, he has a hx of a MRSA thumb infection and previous ESBL. Continue broad coverage.   Thank you so much for this interesting consult,  Principal Problem:   Sepsis ([place]) Active Problems:   AIDS (acquired immune deficiency syndrome) ([place])   Personal history of MRSA (methicillin resistant Staphylococcus aureus)   Prostatitis, acute   History of ESBL E. coli infection   Oral candida   Hemoptysis     HPI: Jacob Gutierrez is a 32 y.o. male with hx of HIV+/AIDS, last contact with ID clinic was 03-2017. Last CD4 10. Was switched to biktarvy 03-2017. Recently off ART. He was recently treated for a groin abscess ("several weeks ago at [facility]") but did not complete his PO anbx.  He comes to ED on 5-18 with dysphagia, n/v, 24h of fever. He was started on vanco/merrem. Diflucan for thrush.  He was found on CT to have 3.6 x 4.2 x 7.6 cm loculated prostatic abscess. He was taken to OR and had TURP.  Post-operatively his course was complicated by hypotension, managed by fluid resuscitation. He remains hypotensive.    Review of Systems: Review of Systems  Constitutional: Positive for fever.  HENT: Positive for sore throat.   Gastrointestinal: Positive for constipation, nausea and vomiting. Negative for diarrhea.  Genitourinary: Positive for dysuria.  Please see HPI. All other systems reviewed  and negative.   Past Medical History:  Diagnosis Date  . 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 H/O coccidioidomycosis    pulmonary   . HIV disease ([place])   . Past history of allergy to penicillin-type antibiotic 07-03-2012   desensitization  . PNA (pneumonia)   . Shigella gastroenteritis   . Syphilis    history /treated     Social History   Tobacco Use  . Smoking status: Current Every Day Smoker    Packs/day: 0.50    Types: Cigarettes  . Smokeless tobacco: Never Used  . Tobacco comment: cutting back  Substance Use Topics  . Alcohol use: Yes    Alcohol/week: 0.5 oz    Types: 1 Standard drinks or equivalent per week    Comment: whiskey occasionally   . Drug use: Yes    Frequency: 1.0 times per week    Types: Marijuana    Family History  Problem Relation Age of Onset  . Hypertension Mother   . Lupus Maternal Grandmother   . Cancer Paternal Grandfather      Medications: Scheduled:   Abtx:  Anti-infectives (From admission, onward)   Start     Dose/Rate Route Frequency Ordered Stop   12/10/17 0400  meropenem (MERREM) 1 g in sodium chloride 0.9 % 100 mL IVPB     1 g 200 mL/hr over 30 Minutes Intravenous Every 8 hours  12/10/17 0208     12/10/17 0300  vancomycin (VANCOCIN) IVPB 1000 mg/200 mL premix     1,000 mg 200 mL/hr over 60 Minutes Intravenous Every 8 hours 12/09/17 1912     12/09/17 2200  gentamicin (GARAMYCIN) 390 mg in dextrose 5 % 100 mL IVPB  Status:  Discontinued     5 mg/kg  77.1 kg 219.5 mL/hr over 30 Minutes Intravenous Every 24 hours 12/09/17 2146 12/09/17 2339   12/09/17 2000  fluconazole (DIFLUCAN) IVPB 200 mg     200 mg 100 mL/hr over 60 Minutes Intravenous Every 24 hours 12/09/17 1915     12/09/17 1915  vancomycin (VANCOCIN) IVPB 1000 mg/200 mL premix     1,000 mg 200 mL/hr over 60 Minutes Intravenous  Once 12/09/17 1906 12/09/17 2057   12/09/17 1915  vancomycin (VANCOCIN) 500 mg in sodium chloride 0.9 %  100 mL IVPB     500 mg 100 mL/hr over 60 Minutes Intravenous  Once 12/09/17 1912 12/09/17 2057        OBJECTIVE: Blood pressure (!) 89/44, pulse (!) 109, temperature 100.2 F (37.9 C), temperature source Oral, resp. rate 19, height _0  (1.854 m), weight 77.1 kg (170 lb), SpO2 100 %.  Physical Exam  Constitutional: He appears toxic. He appears ill. No distress.  Eyes: Pupils are equal, round, and reactive to light. EOM are normal.  Cardiovascular: Tachycardia present.  Pulmonary/Chest: Effort normal and breath sounds normal.  Abdominal: Soft. Bowel sounds are normal. He exhibits no distension. There is no tenderness.  Musculoskeletal: He exhibits no edema.       Legs: Lymphadenopathy:    He has no cervical adenopathy.  Skin: There is erythema.    Lab Results Results for orders placed or performed during the hospital encounter of 12/09/17 (from the past 48 hour(s))  Basic metabolic panel     Status: Abnormal   Collection Time: 12/09/17 11:30 AM  Result Value Ref Range   Sodium 131 (L) 135 - 145 mmol/L   Potassium 3.7 3.5 - 5.1 mmol/L   Chloride 97 (L) 101 - 111 mmol/L   CO2 27 22 - 32 mmol/L   Glucose, Bld 84 65 - 99 mg/dL   BUN 7 6 - 20 mg/dL   Creatinine, Ser 0.62 0.61 - 1.24 mg/dL   Calcium 8.4 (L) 8.9 - 10.3 mg/dL   GFR calc non Af Amer >60 >60 mL/min   GFR calc Af Amer >60 >60 mL/min    Comment: (NOTE) The eGFR has been calculated using the CKD EPI equation. This calculation has not been validated in all clinical situations. eGFR's persistently <60 mL/min signify possible Chronic Kidney Disease.    Anion gap 7 5 - 15    Comment: Performed at [facility] [address], [place], [place] [address]  CBC     Status: Abnormal   Collection Time: 12/09/17 11:30 AM  Result Value Ref Range   WBC 1.5 (L) 4.0 - 10.5 K/uL   RBC 3.56 (L) 4.22 - 5.81 MIL/uL   Hemoglobin 10.8 (L) 13.0 - 17.0 g/dL   HCT 32.9 (L) 39.0 - 52.0 %   MCV 92.4 78.0 - 100.0 fL   MCH 30.3  26.0 - 34.0 pg   MCHC 32.8 30.0 - 36.0 g/dL   RDW 14.1 11.5 - 15.5 %   Platelets 207 150 - 400 K/uL    Comment: Performed at [facility], [place] [address] [place], [place] [address]  I-stat troponin,  ED     Status: None   Collection Time: 12/09/17 12:13 PM  Result Value Ref Range   Troponin i, poc 0.00 0.00 - 0.08 ng/mL   Comment 3            Comment: Due to the release kinetics of cTnI, a negative result within the first hours of the onset of symptoms does not rule out myocardial infarction with certainty. If myocardial infarction is still suspected, repeat the test at appropriate intervals.   I-Stat CG4 Lactic Acid, ED     Status: None   Collection Time: 12/09/17  4:18 PM  Result Value Ref Range   Lactic Acid, Venous 0.52 0.5 - 1.9 mmol/L  Aerobic/Anaerobic Culture (surgical/deep wound)     Status: None (Preliminary result)   Collection Time: 12/09/17 11:24 PM  Result Value Ref Range   Specimen Description      ABSCESS Performed at [facility] [address], [place], [place] [address]    Special Requests      PROSTATE TISSUE Performed at [facility], [place] [address] [place], [place] [address]    Gram Stain      MODERATE WBC PRESENT, PREDOMINANTLY MONONUCLEAR FEW GRAM POSITIVE COCCI IN PAIRS Performed at [facility], [place] [address], [place], [place] [address]    Culture PENDING    Report Status PENDING   Aerobic/Anaerobic Culture (surgical/deep wound)     Status: None (Preliminary result)   Collection Time: 12/09/17 11:28 PM  Result Value Ref Range   Specimen Description      ABSCESS Performed at [facility] [address] [place], [place] [address]    Special Requests      PROSTATE ABSCESS DRAIN Performed at [facility], [place] [address], [place], [place] [address]    Gram Stain      MODERATE WBC PRESENT, PREDOMINANTLY PMN MODERATE GRAM POSITIVE COCCI Performed at  [facility], [place] [address], [place], [place] [address]    Culture PENDING    Report Status PENDING   MRSA PCR Screening     Status: None   Collection Time: 12/10/17  1:06 AM  Result Value Ref Range   MRSA by PCR NEGATIVE NEGATIVE    Comment:        The GeneXpert MRSA Assay (FDA approved for NASAL specimens only), is one component of a comprehensive MRSA colonization surveillance program. It is not intended to diagnose MRSA infection nor to guide or monitor treatment for MRSA infections. Performed at [facility], [place] [address], [place], [place] [address]   Urinalysis, Routine w reflex microscopic     Status: Abnormal   Collection Time: 12/10/17  1:39 AM  Result Value Ref Range   Color, Urine RED (A) YELLOW   APPearance TURBID (A) CLEAR   Specific Gravity, Urine  1.005 - 1.030    TEST NOT REPORTED DUE TO COLOR INTERFERENCE OF URINE PIGMENT   pH  5.0 - 8.0    TEST NOT REPORTED DUE TO COLOR INTERFERENCE OF URINE PIGMENT   Glucose, UA (A) NEGATIVE mg/dL    TEST NOT REPORTED DUE TO COLOR INTERFERENCE OF URINE PIGMENT   Hgb urine dipstick (A) NEGATIVE    TEST NOT REPORTED DUE TO COLOR INTERFERENCE OF URINE PIGMENT   Bilirubin Urine (A) NEGATIVE    TEST NOT REPORTED DUE TO COLOR INTERFERENCE OF URINE PIGMENT   Ketones, ur (A) NEGATIVE mg/dL    TEST  NOT REPORTED DUE TO COLOR INTERFERENCE OF URINE PIGMENT   Protein, ur (A) NEGATIVE mg/dL    TEST NOT REPORTED DUE TO COLOR INTERFERENCE OF URINE PIGMENT   Nitrite (A) NEGATIVE    TEST NOT REPORTED DUE TO COLOR INTERFERENCE OF URINE PIGMENT   Leukocytes, UA (A) NEGATIVE    TEST NOT REPORTED DUE TO COLOR INTERFERENCE OF URINE PIGMENT   RBC / HPF >50 (H) 0 - 5 RBC/hpf   WBC, UA >50 (H) 0 - 5 WBC/hpf   Bacteria, UA MANY (A) NONE SEEN   WBC Clumps PRESENT    Mucus PRESENT    Hyaline Casts, UA PRESENT    Non Squamous Epithelial >50 (A) NONE SEEN    Comment: Performed at [facility], [place] [address] [place], [place] [address]  CBC     Status: Abnormal   Collection Time: 12/10/17  3:40 AM  Result Value Ref Range   WBC 1.5 (L) 4.0 - 10.5 K/uL   RBC 3.27 (L) 4.22 - 5.81 MIL/uL   Hemoglobin 9.9 (L) 13.0 - 17.0 g/dL   HCT 29.7 (L) 39.0 - 52.0 %   MCV 90.8 78.0 - 100.0 fL   MCH 30.3 26.0 - 34.0 pg   MCHC 33.3 30.0 - 36.0 g/dL   RDW 14.3 11.5 - 15.5 %   Platelets 128 (L) 150 - 400 K/uL    Comment: Performed at [facility], [place] [address] [place], [place] [address]  Basic metabolic panel     Status: Abnormal   Collection Time: 12/10/17  3:40 AM  Result Value Ref Range   Sodium 131 (L) 135 - 145 mmol/L   Potassium 2.9 (L) 3.5 - 5.1 mmol/L   Chloride 100 (L) 101 - 111 mmol/L   CO2 21 (L) 22 - 32 mmol/L   Glucose, Bld 107 (H) 65 - 99 mg/dL   BUN 9 6 - 20 mg/dL   Creatinine, Ser 0.95 0.61 - 1.24 mg/dL   Calcium 7.7 (L) 8.9 - 10.3 mg/dL   GFR calc non Af Amer >60 >60 mL/min   GFR calc Af Amer >60 >60 mL/min    Comment: (NOTE) The eGFR has been calculated using the CKD EPI equation. This calculation has not been validated in all clinical situations. eGFR's persistently <60 mL/min signify possible Chronic Kidney Disease.    Anion gap 10 5 - 15    Comment: Performed at [facility], [place] [address] [place], [place] [address]  Magnesium     Status: Abnormal   Collection Time: 12/10/17  3:40 AM  Result Value Ref Range   Magnesium 1.3 (L) 1.7 - 2.4 mg/dL    Comment: Performed at [facility], [place] [address] [place], [place] [address]  Lactic acid, plasma     Status: None   Collection Time: 12/10/17 11:06 AM  Result Value Ref Range   Lactic Acid, Venous 1.6 0.5 - 1.9 mmol/L    Comment: Performed at [facility], [place] [address] [place], [place] [address]  Lactic acid, plasma     Status: Abnormal   Collection Time: 12/10/17  1:05 PM  Result Value Ref Range   Lactic Acid, Venous 2.4 (HH) 0.5 -  1.9 mmol/L    Comment: CRITICAL RESULT CALLED TO, READ BACK BY AND VERIFIED WITH: MATHI,E RN [place] Performed at [facility], [place] [address] [place], [place] [address]       Component Value  Date/Time   SDES  12/09/2017 2328    ABSCESS Performed at [facility], [place] [address], [place], [place] [address]    [place]  12/09/2017 2328    PROSTATE ABSCESS DRAIN Performed at [facility], [place] [address] [place], [place] [address]    CULT PENDING 12/09/2017 2328   REPTSTATUS PENDING 12/09/2017 2328   Dg Chest 2 View  Result Date: 12/09/2017 CLINICAL DATA:  Cough with congestion and sore throat as well as chest pain. EXAM: CHEST - 2 VIEW COMPARISON:  03/06/2017 FINDINGS: The heart size and mediastinal contours are within normal limits. Both lungs are clear. The visualized skeletal structures are unremarkable. IMPRESSION: No active cardiopulmonary disease. Electronically Signed   By: Marin Olp M.D.   On: 12/09/2017 11:58   Ct Abdomen Pelvis W Contrast  Result Date: 12/09/2017 CLINICAL DATA:  Cough, congestion, sore throat, vomiting. LEFT upper quadrant pain. History of HIV/aids and treated syphilis. EXAM: CT ABDOMEN AND PELVIS WITH CONTRAST TECHNIQUE: Multidetector CT imaging of the abdomen and pelvis was performed using the standard protocol following bolus administration of intravenous contrast. CONTRAST:  141m OMNIPAQUE IOHEXOL 300 MG/ML  SOLN COMPARISON:  CT abdomen and pelvis March 05, 2017 FINDINGS: LOWER CHEST: Mild suspected esophageal wall thickening, partially imaged. Lung bases are clear. Included heart size is normal. No pericardial effusion. HEPATOBILIARY: Liver and gallbladder are normal. PANCREAS: Normal. SPLEEN: Normal. ADRENALS/URINARY TRACT: Kidneys are orthotopic, demonstrating symmetric enhancement. Mild symmetric nephromegaly. No nephrolithiasis, hydronephrosis or solid renal masses. The  unopacified ureters are normal in course and caliber. No urinary bladder is partially distended and unremarkable. Normal adrenal glands. STOMACH/BOWEL: Mild rectal wall thickening and edema. Mild amount of retained large bowel stool. No bowel obstruction. VASCULAR/LYMPHATIC: Aortoiliac vessels are normal in course and caliber. No lymphadenopathy by CT size criteria. Similar greater than expected number of mildly enlarged retroperitoneal, abdominal and inguinal lymph nodes. REPRODUCTIVE: Multilobulated 3.6 x 4.2 x 7.2 cm prostatic abscess, possibly loculated. OTHER: No intraperitoneal free fluid or free air. MUSCULOSKELETAL: Nonacute.  Mild L5-S1 degenerative disc. IMPRESSION: 1. 3.6 x 4.2 x 7.2 cm prostatic abscess, possibly loculated. 2. Mild proctitis and suspected esophagitis. 3. Mild nephromegaly and mild lymphadenopathy, findings associated with HIV. 4. Acute findings discussed with and reconfirmed by Dr.LINDSEY LAYDEN on 12/09/2017 at 6:48 pm. Electronically Signed   By: CElon AlasM.D.   On: 12/09/2017 18:50   Recent Results (from the past 240 hour(s))  Aerobic/Anaerobic Culture (surgical/deep wound)     Status: None (Preliminary result)   Collection Time: 12/09/17 11:24 PM  Result Value Ref Range Status   Specimen Description   Final    ABSCESS Performed at [address], [place] [id number]   Special Requests   Final    PROSTATE TISSUE Performed at [facility] [address] [place] [address]   Gram Stain   Final    MODERATE WBC PRESENT, PREDOMINANTLY MONONUCLEAR FEW GRAM POSITIVE COCCI IN PAIRS [facility]formed [facility] [address] [place] [address]   Culture PENDING  Incomplete   Report Status PENDING  Incomplete  Aerobic/Anaerobic Culture (surgical/deep wound)     Status: None (Preliminary result)   Collection Time: 12/09/17 11:28 PM  Result Value Ref Range Status   Specimen  Description   Final    ABSCESS Performed at [facility] [place] [address]   Special  Requests   Final    PROSTATE ABSCESS DRAIN Performed at [facility], [place] [address], [place], [place] [address]    Gram Stain   Final    MODERATE WBC PRESENT, PREDOMINANTLY PMN MODERATE GRAM POSITIVE COCCI Performed at [facility], [place] [address] [place], [place] [address]    Culture PENDING  Incomplete   Report Status PENDING  Incomplete  MRSA PCR Screening     Status: None   Collection Time: 12/10/17  1:06 AM  Result Value Ref Range Status   MRSA by PCR NEGATIVE NEGATIVE Final    Comment:        The GeneXpert MRSA Assay (FDA approved for NASAL specimens only), is one component of a comprehensive MRSA colonization surveillance program. It is not intended to diagnose MRSA infection nor to guide or monitor treatment for MRSA infections. Performed at [facility], [place] [address] [place], [place] [address]     Microbiology: Recent Results (from the past 240 hour(s))  Aerobic/Anaerobic Culture (surgical/deep wound)     Status: None (Preliminary result)   Collection Time: 12/09/17 11:24 PM  Result Value Ref Range Status   Specimen Description   Final    ABSCESS Performed at [facility] [address] [place], [place] [address]    Special Requests   Final    PROSTATE TISSUE Performed at [facility], [place] [address] [place], [place] [address]    Gram Stain   Final    MODERATE WBC PRESENT, PREDOMINANTLY MONONUCLEAR FEW GRAM POSITIVE COCCI IN PAIRS Performed at [facility], [place] [address] [place], [place] [address]    Culture PENDING  Incomplete   Report Status PENDING  Incomplete  Aerobic/Anaerobic Culture (surgical/deep wound)     Status: None (Preliminary result)   Collection Time: 12/09/17 11:28 PM  Result Value Ref Range Status   Specimen  Description   Final    ABSCESS Performed at [facility] [address], [place], [place] [address]    Special Requests   Final    PROSTATE ABSCESS DRAIN Performed at [facility], [place] [address] [place], [place] [address]    Gram Stain   Final    MODERATE WBC PRESENT, PREDOMINANTLY PMN MODERATE GRAM POSITIVE COCCI Performed at [facility], [place] [address] [place], [place] [address]    Culture PENDING  Incomplete   Report Status PENDING  Incomplete  MRSA PCR Screening     Status: None   Collection Time: 12/10/17  1:06 AM  Result Value Ref Range Status   MRSA by PCR NEGATIVE NEGATIVE Final    Comment:        The GeneXpert MRSA Assay (FDA approved for NASAL specimens only), is one component of a comprehensive MRSA colonization surveillance program. It is not intended to diagnose MRSA infection nor to guide or monitor treatment for MRSA infections. Performed at [facility], [place] [address] [place],  [address]     Radiographs and labs were personally reviewed by me.   Bobby Rumpf, MD [facility] [phone] 12/10/2017, 2:44 PM

## 2017-12-10 NOTE — Progress Notes (Signed)
PROGRESS NOTE   Jacob Gutierrez  ZOX:[id number]    DOB: 1986/03/30    DOA: 12/09/2017  PCP: Grayce Sessions, NP   I have briefly reviewed patients previous medical records in [facility].  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  Initially coffee-ground and then dry heaving only, difficulty breathing, nonproductive cough, fever up to 102 F, abdominal pain admitted for sepsis due to prostatic abscess.  Urology performed unroofing of prostatic abscess and TURP 5/18.  ID consulted and yet to see.   Assessment & Plan:   Principal Problem:   Sepsis (HCC) Active Problems:   AIDS (acquired immune deficiency syndrome) (HCC)   Personal history of MRSA (methicillin resistant Staphylococcus aureus)   Prostatitis, acute   History of ESBL E. coli infection   Oral candida   Hemoptysis   Severe sepsis: Likely due to acute prostatitis with prostatic abscess.  Status post unroofing of prostatic abscess and TURP by urology on 5/18.  Empirically started on IV vancomycin and meropenem after discussing with ID.  Abscess Gram stain shows gram-positive cocci in pairs, culture pending.  MRSA PCR negative.  No blood cultures were drawn on admission, drawn 5/19.  On morning of 5/19, hypotensive in the mid 70s, tachycardic, febrile up to 103 F.  Aggressive IV fluid hydration, check and trend serum lactate.  If after hydration, remains hypotensive, may need pressors.  Discussed with CCM and alerted that may need assistance.  ID formal input pending.  Prostate abscess: CT  abdomen confirmed prostate abscess.  Urology consulted and patient underwent cystoscopy, TURP and unroofing of abscess on 5/18.  Continue antibiotics as above.  As per urology follow-up, catheter should remain until fever subsided and there is no gross hematuria, repeat CT scan of prostate some point during this hospitalization to ensure abscess has been completely drained especially if he continues to spike fevers.  Abscess AFB cultures pending.  Oral and esophageal candidiasis: Continue fluconazole and Magic mouthwash.  Hypokalemia/hypomagnesemia: Replace aggressively and follow.  Pancytopenia: Anemia and leukopenia appear chronic.  Current worsening may be related to sepsis and HIV/AIDS.  No overt bleeding reported.  Follow CBCs daily.  Transfuse if hemoglobin <7 g per DL.  Nausea and vomiting:?  Coffee-ground emesis early on but presently only dry heaving and no coffee grounds.  IV Pepcid added.  PRN antiemetics.  If continues to have vomiting or dry heaving, may have to downgrade diet.  HIV/AIDS: Noncompliant with HAART.  ID input pending.  Severe malnutrition/Body mass index is 22.43 kg/m.  Will consult dietitian.  Hemoptysis: Not sure if he had this.  None since admission.  No cavitary lesions on chest x-ray.  Monitor.   DVT prophylaxis: SCDs. Code Status: Full Family Communication: None at bedside Disposition: To to stepdown unit, continue care here.  DC home pending clinical improvement which may take several days.   Consultants:  Urology Infectious disease PCCM  Procedures:  Cystoscopy, TURP and unroofing of prostatic abscess 5/18 Foley catheter.  Antimicrobials:  IV vancomycin + meropenem 5/18 > IV fluconazole 5/18 >.   Subjective: Patient seen this morning.  Reports feeling cold.  Dry heaving this morning.  No coffee-ground emesis or coughing of blood reported.  As per RN, fever of 103.14F, low blood pressures.  Denies pain, dizziness, lightheadedness.  ROS: As  above.  Objective:  Vitals:   12/10/17 0600 12/10/17 0800 12/10/17 1000 12/10/17 1005  BP: (!) 114/53 (!) 94/47 (!) 77/33   Pulse: (!) 125 (!) 119 (!) 118   Resp: 19 19 (!) 22   Temp:  (!) 103.4 F (39.7 C)  (!) 102.4 F (39.1 C)  TempSrc:  Oral  Oral  SpO2: 91% 94% 94%   Weight:      Height:        Examination:  General exam: Pleasant young male, moderately built, thinly nourished and chronically ill looking lying comfortably propped up in bed.  Oral mucosa dry.  Oral thrush + +. Respiratory system: Clear to auscultation. Respiratory effort normal. Cardiovascular system: S1 & S2 heard, RRR. No JVD, murmurs, rubs, gallops or clicks. No pedal edema.  Telemetry personally reviewed: Sinus tachycardia in the 120s. Gastrointestinal system: Abdomen is nondistended, soft and nontender. No organomegaly or masses felt. Normal bowel sounds heard. Central nervous system: Alert and oriented. No focal neurological deficits. Extremities: Symmetric 5 x 5 power. Skin: Healing superficial approximately 2 x 1 cm ulcer over right anterior proximal thigh without any acute findings suggestive of wound infection, cellulitis or abscess. Psychiatry: Judgement and insight appear normal. Mood & affect appropriate.     Data Reviewed: I have personally reviewed following labs and imaging studies  CBC: Recent Labs  Lab 12/09/17 1130 12/10/17 0340  WBC 1.5* 1.5*  HGB 10.8* 9.9*  HCT 32.9* 29.7*  MCV 92.4 90.8  PLT 207 128*   Basic Metabolic Panel: Recent Labs  Lab 12/09/17 1130 12/10/17 0340  NA 131* 131*  K 3.7 2.9*  CL 97* 100*  CO2 27 21*  GLUCOSE 84 107*  BUN 7 9  CREATININE 0.62 0.95  CALCIUM 8.4* 7.7*  MG  --  1.3*   Liver Function Tests: No results for input(s): AST, ALT, ALKPHOS, BILITOT, PROT, ALBUMIN in the last 168 hours.   Recent Results (from the past 240 hour(s))  Aerobic/Anaerobic Culture (surgical/deep wound)     Status: None (Preliminary result)   Collection Time:  12/09/17 11:24 PM  Result Value Ref Range Status   Specimen Description   Final    ABSCESS Performed at [facility], [address] [place], [place] [address]    Special Requests   Final    PROSTATE TISSUE Performed at [facility], [address], [place], [place] [address]    Gram Stain   Final    MODERATE WBC PRESENT, PREDOMINANTLY MONONUCLEAR FEW GRAM POSITIVE COCCI IN PAIRS Performed at [facility], [address], [place], [place] [address]    Culture PENDING  Incomplete   Report Status PENDING  Incomplete  Aerobic/Anaerobic Culture (surgical/deep wound)     Status: None (Preliminary result)   Collection Time: 12/09/17 11:28 PM  Result Value Ref Range Status   Specimen Description   Final    ABSCESS Performed at [facility], [address] [place], [place] [address]    Special Requests   Final    PROSTATE ABSCESS DRAIN Performed at [facility], [address], [place], [place] [address]    Gram Stain   Final    MODERATE WBC PRESENT, PREDOMINANTLY PMN MODERATE GRAM POSITIVE COCCI Performed at [facility], [address] [place], [place] [address]    Culture PENDING  Incomplete   Report Status PENDING  Incomplete  MRSA PCR Screening     Status: None   Collection Time: 12/10/17  1:06 AM  Result Value Ref Range Status   MRSA by PCR NEGATIVE NEGATIVE Final    Comment:        The GeneXpert MRSA Assay (FDA approved for NASAL specimens only), is one component of a comprehensive MRSA colonization surveillance program. It is not intended to diagnose MRSA infection nor to guide or monitor treatment for MRSA infections. Performed at [facility], [address], [place], [place] [address]          Radiology Studies: Dg Chest 2 View  Result Date: 12/09/2017 CLINICAL DATA:  Cough with congestion and sore throat as well as chest pain. EXAM: CHEST - 2  VIEW COMPARISON:  03/06/2017 FINDINGS: The heart size and mediastinal contours are within normal limits. Both lungs are clear. The visualized skeletal structures are unremarkable. IMPRESSION: No active cardiopulmonary disease. Electronically Signed   By: Elberta Fortis M.D.   On: 12/09/2017 11:58   Ct Abdomen Pelvis W Contrast  Result Date: 12/09/2017 CLINICAL DATA:  Cough, congestion, sore throat, vomiting. LEFT upper quadrant pain. History of HIV/aids and treated syphilis. EXAM: CT ABDOMEN AND PELVIS WITH CONTRAST TECHNIQUE: Multidetector CT imaging of the abdomen and pelvis was performed using the standard protocol following bolus administration of intravenous contrast. CONTRAST:  OMNIPAQUE IOHEXOL 300 MG/ML  SOLN COMPARISON:  CT abdomen and pelvis March 05, 2017 FINDINGS: LOWER CHEST: Mild suspected esophageal wall thickening, partially imaged. Lung bases are clear. Included heart size is normal. No pericardial effusion. HEPATOBILIARY: Liver and gallbladder are normal. PANCREAS: Normal. SPLEEN: Normal. ADRENALS/URINARY TRACT: Kidneys are orthotopic, demonstrating symmetric enhancement. Mild symmetric nephromegaly. No nephrolithiasis, hydronephrosis or solid renal masses. The unopacified ureters are normal in course and caliber. No urinary bladder is partially distended and unremarkable. Normal adrenal glands. STOMACH/BOWEL: Mild rectal wall thickening and edema. Mild amount of retained large bowel stool. No bowel obstruction. VASCULAR/LYMPHATIC: Aortoiliac vessels are normal in course and caliber. No lymphadenopathy by CT size criteria. Similar greater than expected number of mildly enlarged retroperitoneal, abdominal and inguinal lymph nodes. REPRODUCTIVE: Multilobulated 3.6 x 4.2 x 7.2 cm prostatic abscess, possibly loculated. OTHER: No intraperitoneal free fluid or free air. MUSCULOSKELETAL: Nonacute.  Mild L5-S1 degenerative disc. IMPRESSION: 1. 3.6 x 4.2 x 7.2 cm prostatic abscess, possibly  loculated. 2. Mild proctitis and suspected esophagitis. 3. Mild nephromegaly and mild lymphadenopathy, findings associated with HIV. 4. Acute findings discussed with and reconfirmed by Dr.LINDSEY LAYDEN on 12/09/2017 at 6:48 pm. Electronically Signed   By: Awilda Metro M.D.   On: 12/09/2017 18:50        Scheduled Meds: . potassium chloride  40 mEq Oral Q4H   Continuous Infusions: . sodium chloride 150 mL/hr at 12/10/17 1029  . famotidine (PEPCID) IV Stopped (12/10/17 1035)  . fluconazole (DIFLUCAN) IV    . magnesium sulfate 1 - 4 g bolus IVPB 4 g (12/10/17 1033)  . meropenem (MERREM) IV Stopped (12/10/17 0834)  . sodium chloride 1,000 mL (12/10/17 1029)  . sodium chloride    . vancomycin Stopped (12/10/17 0428)     LOS: 1 day     Marcellus Scott, MD, FACP, [facility]. [facility] Pager [phone]  If 7PM-7AM, please contact night-coverage [email or web address] Password [email or web address] 12/10/2017, 11:22 AM

## 2017-12-10 NOTE — H&P (Signed)
History and Physical    Jacob Gutierrez:[id number] DOB: September 29, 1985 DOA: 12/09/2017  PCP: Grayce Sessions, NP  Patient coming from: Home  I have personally briefly reviewed patient's old medical records in [facility]  Chief Complaint: SOB  HPI: Jacob Gutierrez is a 32 y.o. male with medical history significant of HIV/AIDS, last CD4 count was 10, has been off of HAART for past year.  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    Patient presents to the ED with c/o difficulty swallowing, vomiting, difficulty breathing over the last week.  Patient states that his throat started hurting and he has had difficulty swallowing that began a week ago.  Very painful swallowing, unable to swallow liquids or solids.  Patient also reports that he has been having some cough that is nonproductive.  Patient reports that he had several episodes of vomiting over the last few days.  Patient reports that at onset of symptoms, it appeared like a coffee-ground emesis.  He states that today, he is only had dry heaves.  He states that this morning, he was febrile at 102.0.  Patient states that he has had generalized abdominal pain with no focal point.  Patient had recently been seen for an abscess noted to his right groin.  Patient reports that he had had the abscess drained and was started on antibiotics which he states he did not complete.  Patient states that he is not currently on any antiviral medications.  Patient reports that he has not seen ID in the last 6 months.    ED Course: Found to have oral thrush, prostatic abscess, Fever 102.x, WBC 1.5k.  Patient taken by Urology to OR for drainage of prostatic abscess.  Given doses of gent and vanc peri-op.  Also noted to have some bloody sputum with intubation.  They were concerned about TB so put him in an isolation  room.   Review of Systems: As per HPI otherwise 10 point review of systems negative.   Past Medical History:  Diagnosis Date  . 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 H/O coccidioidomycosis    pulmonary   . HIV disease (HCC)   . Past history of allergy to penicillin-type antibiotic 07-03-2012   desensitization  . PNA (pneumonia)   . Shigella gastroenteritis   . Syphilis    history /treated     History reviewed. No pertinent surgical history.   reports that he has been smoking cigarettes.  He has been smoking about 0.50 packs per day. He has never used smokeless tobacco. He reports that he drinks about 0.5 oz of alcohol per week. He reports that he has current or past drug history. Drug: Marijuana. Frequency: 1.00 time per week.  Allergies  Allergen Reactions  . Amoxicillin Other (See Comments)    From childhood: "I had a reaction when i was little." (??)    Family History  Problem Relation Age of Onset  . Hypertension Mother   . Lupus Maternal Grandmother   . Cancer Paternal Grandfather      Prior to Admission medications   Medication Sig Start Date End Date Taking? Authorizing Provider  acetaminophen (TYLENOL) 500 MG tablet Take 500-1,000 mg by mouth every 6 (six) hours as needed (for pain, fever, or headache).   Yes [provider]  cephALEXin (KEFLEX) 500 MG capsule Take  500 mg by mouth 2 (two) times daily. FOR 7 DAYS   Yes [provider]  famotidine (PEPCID AC) 10 MG chewable tablet Chew 10 mg by mouth 2 (two) times daily as needed for heartburn.   Yes [provider]  sulfamethoxazole-trimethoprim (BACTRIM DS,SEPTRA DS) 800-160 MG tablet Take 1 tablet by mouth daily. Patient taking differently: Take 1 tablet by mouth 2 (two) times daily. FOR 10 DAYS 03/09/17 12/09/17 Yes Bland, Scott, DO  benzocaine (ORAJEL) 10 % mucosal gel Use as directed in the mouth or throat 4 (four) times daily as needed for mouth  pain. Patient not taking: Reported on 12/09/2017 03/08/17   Marthenia Rolling, DO    Physical Exam: Vitals:   12/09/17 2046 12/09/17 2355 12/10/17 0000 12/10/17 0015  BP:  116/77 115/71 114/80  Pulse: 99 (!) 116 (!) 116 (!) 120  Resp: Temp: 99 F (37.2 C) 100.2 F (37.9 C)    TempSrc: Oral     SpO2: 95% 100% 99% 100%  Weight:      Height:        Constitutional: NAD, calm, comfortable, Cachectic Eyes: PERRL, lids and conjunctivae normal ENMT: Oral Candidiasis noted Neck: normal, supple, no masses, no thyromegaly Respiratory: No respiratory distress. Cardiovascular: Regular rate and rhythm, no murmurs / rubs / gallops. No extremity edema. 2+ pedal pulses. No carotid bruits.  Abdomen: TTP. Musculoskeletal: no clubbing / cyanosis. No joint deformity upper and lower extremities. Good ROM, no contractures. Normal muscle tone.  Skin: 2 cm wound noted to upper left thigh with some mild drainage noted.  No surrounding warmth, erythema. Neurologic: CN 2-12 grossly intact. Sensation intact, DTR normal. Strength 5/5 in all 4.  Psychiatric: Normal judgment and insight. Alert and oriented x 3. Normal mood.    Labs on Admission: I have personally reviewed following labs and imaging studies  CBC: Recent Labs  Lab 12/09/17 1130  WBC 1.5*  HGB 10.8*  HCT 32.9*  MCV 92.4  PLT 207   Basic Metabolic Panel: Recent Labs  Lab 12/09/17 1130  NA 131*  K 3.7  CL 97*  CO2 27  GLUCOSE 84  BUN 7  CREATININE 0.62  CALCIUM 8.4*   GFR: Estimated Creatinine Clearance: 141.9 mL/min (by C-G formula based on SCr of 0.62 mg/dL). Liver Function Tests: No results for input(s): AST, ALT, ALKPHOS, BILITOT, PROT, ALBUMIN in the last 168 hours. No results for input(s): LIPASE, AMYLASE in the last 168 hours. No results for input(s): AMMONIA in the last 168 hours. Coagulation Profile: No results for input(s): INR, PROTIME in the last 168 hours. Cardiac Enzymes: No results for input(s):  CKTOTAL, CKMB, CKMBINDEX, TROPONINI in the last 168 hours. BNP (last 3 results) No results for input(s): PROBNP in the last 8760 hours. HbA1C: No results for input(s): HGBA1C in the last 72 hours. CBG: No results for input(s): GLUCAP in the last 168 hours. Lipid Profile: No results for input(s): CHOL, HDL, LDLCALC, TRIG, CHOLHDL, LDLDIRECT in the last 72 hours. Thyroid Function Tests: No results for input(s): TSH, T4TOTAL, FREET4, T3FREE, THYROIDAB in the last 72 hours. Anemia Panel: No results for input(s): VITAMINB12, FOLATE, FERRITIN, TIBC, IRON, RETICCTPCT in the last 72 hours. Urine analysis:    Component Value Date/Time   COLORURINE YELLOW 03/05/2017 0715   APPEARANCEUR HAZY (A) 03/05/2017 0715   APPEARANCEUR Hazy 07/27/2013 1742   LABSPEC 1.020 03/05/2017 0715   LABSPEC 1.026 07/27/2013 1742   PHURINE 5.0 03/05/2017 0715   GLUCOSEU  NEGATIVE 03/05/2017 0715   GLUCOSEU Negative 07/27/2013 1742   HGBUR NEGATIVE 03/05/2017 0715   BILIRUBINUR NEGATIVE 03/05/2017 0715   BILIRUBINUR Negative 07/27/2013 1742   KETONESUR NEGATIVE 03/05/2017 0715   PROTEINUR 30 (A) 03/05/2017 0715   UROBILINOGEN 1 12/17/2013 1118   NITRITE NEGATIVE 03/05/2017 0715   LEUKOCYTESUR TRACE (A) 03/05/2017 0715   LEUKOCYTESUR Negative 07/27/2013 1742    Radiological Exams on Admission: Dg Chest 2 View  Result Date: 12/09/2017 CLINICAL DATA:  Cough with congestion and sore throat as well as chest pain. EXAM: CHEST - 2 VIEW COMPARISON:  03/06/2017 FINDINGS: The heart size and mediastinal contours are within normal limits. Both lungs are clear. The visualized skeletal structures are unremarkable. IMPRESSION: No active cardiopulmonary disease. Electronically Signed   By: Elberta Fortis M.D.   On: 12/09/2017 11:58   Ct Abdomen Pelvis W Contrast  Result Date: 12/09/2017 CLINICAL DATA:  Cough, congestion, sore throat, vomiting. LEFT upper quadrant pain. History of HIV/aids and treated syphilis. EXAM: CT  ABDOMEN AND PELVIS WITH CONTRAST TECHNIQUE: Multidetector CT imaging of the abdomen and pelvis was performed using the standard protocol following bolus administration of intravenous contrast. CONTRAST:  OMNIPAQUE IOHEXOL 300 MG/ML  SOLN COMPARISON:  CT abdomen and pelvis March 05, 2017 FINDINGS: LOWER CHEST: Mild suspected esophageal wall thickening, partially imaged. Lung bases are clear. Included heart size is normal. No pericardial effusion. HEPATOBILIARY: Liver and gallbladder are normal. PANCREAS: Normal. SPLEEN: Normal. ADRENALS/URINARY TRACT: Kidneys are orthotopic, demonstrating symmetric enhancement. Mild symmetric nephromegaly. No nephrolithiasis, hydronephrosis or solid renal masses. The unopacified ureters are normal in course and caliber. No urinary bladder is partially distended and unremarkable. Normal adrenal glands. STOMACH/BOWEL: Mild rectal wall thickening and edema. Mild amount of retained large bowel stool. No bowel obstruction. VASCULAR/LYMPHATIC: Aortoiliac vessels are normal in course and caliber. No lymphadenopathy by CT size criteria. Similar greater than expected number of mildly enlarged retroperitoneal, abdominal and inguinal lymph nodes. REPRODUCTIVE: Multilobulated 3.6 x 4.2 x 7.2 cm prostatic abscess, possibly loculated. OTHER: No intraperitoneal free fluid or free air. MUSCULOSKELETAL: Nonacute.  Mild L5-S1 degenerative disc. IMPRESSION: 1. 3.6 x 4.2 x 7.2 cm prostatic abscess, possibly loculated. 2. Mild proctitis and suspected esophagitis. 3. Mild nephromegaly and mild lymphadenopathy, findings associated with HIV. 4. Acute findings discussed with and reconfirmed by Dr.LINDSEY LAYDEN on 12/09/2017 at 6:48 pm. Electronically Signed   By: Awilda Metro M.D.   On: 12/09/2017 18:50    EKG: Independently reviewed.  Assessment/Plan Principal Problem:   Sepsis (HCC) Active Problems:   AIDS (acquired immune deficiency syndrome) (HCC)   Personal history of MRSA  (methicillin resistant Staphylococcus aureus)   Prostatitis, acute   History of ESBL E. coli infection   Oral candida   Hemoptysis    1. Sepsis - likely due to prostatitis 1. S/p I+D of prostate 2. GS, cultures, AFB pending 3. UA and UCx also ordered 4. Will leave him on empiric vanc for h/o MRSA abscesses (culture of thumb abscess in Dec grew MRSA, see Care everywhere). 5. Switch gent to [facility] for h/o ESBL e.coli UTI (Aug last year, see Epic). 6. IVF: 1L bolus and 125 cc/hr lactated ringers 7. Repeat CBC/BMP in AM 2. Oral Candida - and likely candidal esophagitis 1. Fluconazole per ID recs 3. HIV/AIDS - 1. ID consulted, spoke with Dr. Ninetta Lights about empiric ABx choices as above, he was okay with these. 2. ID will see in hospital tomorrow. 3. Last CD4 count was 10, cant imagine its improved  much not on HAART for past year. 4. Hemoptysis - 1. Probably secondary to oral / esophageal candidiasis 2. No evidence of cavitary lesions reported on CXR 3. Dr. Ninetta Lights agreed didn't sound like he needed airborne isolation at this time  DVT prophylaxis: SCDs for now due to immediate post op status - add chemo ppx when okay with urology Code Status: Full Family Communication: No family in room Disposition Plan: TBD Consults called: ID: spoke with Dr. Ninetta Lights.  Dr. Marlou Porch just operated on patient as noted above Admission status: Admit to inpatient   Hillary Bow DO [facility] Pager [phone]  If 7AM-7PM, please contact day team taking care of patient [email or web address] Password [email or web address]  12/10/2017, 12:30 AM

## 2017-12-11 DIAGNOSIS — N412 Abscess of prostate: Secondary | ICD-10-CM

## 2017-12-11 DIAGNOSIS — D61818 Other pancytopenia: Secondary | ICD-10-CM

## 2017-12-11 DIAGNOSIS — B9689 Other specified bacterial agents as the cause of diseases classified elsewhere: Secondary | ICD-10-CM

## 2017-12-11 DIAGNOSIS — Z9114 Patient's other noncompliance with medication regimen: Secondary | ICD-10-CM

## 2017-12-11 DIAGNOSIS — Z9079 Acquired absence of other genital organ(s): Secondary | ICD-10-CM

## 2017-12-11 DIAGNOSIS — B3781 Candidal esophagitis: Secondary | ICD-10-CM

## 2017-12-11 LAB — ACID FAST SMEAR (AFB): ACID FAST SMEAR - AFSCU2: NEGATIVE

## 2017-12-11 LAB — BASIC METABOLIC PANEL
ANION GAP: 6 (ref 5–15)
BUN: 13 mg/dL (ref 6–20)
CHLORIDE: 110 mmol/L (ref 101–111)
CO2: 18 mmol/L — AB (ref 22–32)
CREATININE: 0.86 mg/dL (ref 0.61–1.24)
Calcium: 7.1 mg/dL — ABNORMAL LOW (ref 8.9–10.3)
GFR calc non Af Amer: >60 mL/min (ref >60)
Glucose, Bld: 147 mg/dL — ABNORMAL HIGH (ref 65–99)
Potassium: 4 mmol/L (ref 3.5–5.1)
SODIUM: 134 mmol/L — AB (ref 135–145)

## 2017-12-11 LAB — CBC WITH DIFFERENTIAL/PLATELET
BASOS ABS: 0 10*3/uL (ref 0.0–0.1)
Basophils Relative: 0 %
Eosinophils Absolute: 0.2 10*3/uL (ref 0.0–0.7)
Eosinophils Relative: 6 %
HCT: 26.6 % — ABNORMAL LOW (ref 39.0–52.0)
HEMOGLOBIN: 9.1 g/dL — AB (ref 13.0–17.0)
LYMPHS PCT: 4 %
Lymphs Abs: 0.1 10*3/uL — ABNORMAL LOW (ref 0.7–4.0)
MCH: 31.5 pg (ref 26.0–34.0)
MCHC: 34.2 g/dL (ref 30.0–36.0)
MCV: 92 fL (ref 78.0–100.0)
Monocytes Absolute: 0.1 10*3/uL (ref 0.1–1.0)
Monocytes Relative: 3 %
NEUTROS ABS: 2.3 10*3/uL (ref 1.7–7.7)
NEUTROS PCT: 87 %
Platelets: 135 10*3/uL — ABNORMAL LOW (ref 150–400)
RBC: 2.89 MIL/uL — AB (ref 4.22–5.81)
RDW: 14.8 % (ref 11.5–15.5)
WBC: 2.7 10*3/uL — AB (ref 4.0–10.5)

## 2017-12-11 LAB — URINE CULTURE: Culture: NO GROWTH

## 2017-12-11 LAB — CD4/CD8 (T-HELPER/T-SUPPRESSOR CELL)
CD4 absolute: <10 /uL — ABNORMAL LOW (ref 500–1900)
CD8 T Cell Abs: 40 /uL — ABNORMAL LOW (ref 230–1000)
CD8tox: 28 % (ref 15.0–40.0)
Total lymphocyte count: 130 /uL — ABNORMAL LOW (ref 1000–4000)

## 2017-12-11 LAB — ACID FAST SMEAR (AFB, MYCOBACTERIA)

## 2017-12-11 LAB — MAGNESIUM: MAGNESIUM: 1.8 mg/dL (ref 1.7–2.4)

## 2017-12-11 MED ORDER — FAMOTIDINE 20 MG PO TABS
20.0000 mg | ORAL_TABLET | Freq: Two times a day (BID) | ORAL | Status: DC
  Administered 2017-12-11 – 2017-12-14 (×8): 20 mg via ORAL
  Filled 2017-12-11 (×9): qty 1

## 2017-12-11 MED ORDER — SODIUM CHLORIDE 0.9 % IV SOLN
INTRAVENOUS | Status: DC
  Administered 2017-12-11: 09:00:00 via INTRAVENOUS

## 2017-12-11 MED ORDER — SODIUM CHLORIDE 0.9 % IV SOLN
INTRAVENOUS | Status: DC
  Administered 2017-12-11: 17:00:00 via INTRAVENOUS

## 2017-12-11 NOTE — Progress Notes (Signed)
PROGRESS NOTE   Jacob Gutierrez  ZOX:[id number]    DOB: [birth year]    DOA: 12/09/2017  PCP: Grayce Sessions, NP   I have briefly reviewed patients previous medical records in [facility].  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  Initially coffee-ground and then dry heaving only, difficulty breathing, nonproductive cough, fever up to 102 F, abdominal pain admitted for sepsis due to prostatic abscess.  Urology performed unroofing of prostatic abscess and TURP 5/18.  ID consulted.  CCM consulted for hypotension and concern for impending shock, signed off 5/20.   Assessment & Plan:   Principal Problem:   Sepsis (HCC) Active Problems:   AIDS (acquired immune deficiency syndrome) (HCC)   Personal history of MRSA (methicillin resistant Staphylococcus aureus)   Prostatitis, acute   History of ESBL E. coli infection   Oral candida   Hemoptysis   Severe sepsis: Likely due to acute prostatitis with prostatic abscess.  Status post unroofing of prostatic abscess and TURP by urology on 5/18.  Empirically started on IV vancomycin and meropenem after discussing with ID.  Abscess Gram stain shows gram-positive cocci in pairs, culture negative to date.  MRSA PCR negative.  No blood cultures were drawn on admission, drawn 5/19 and pending.  Urine culture negative.  On morning of 5/19, hypotensive in the mid 70s, tachycardic, febrile up to 103 F.  Treated aggressively with IV fluids, almost 6 L crystalloids, lactate 1.6 > 2.4, stress dose steroids started, did not  require pressors.  ID input appreciated and continued above antibiotics.  Clinically improved, fevers down, hypotension resolved, reduce IV fluids, start tapering IV hydrocortisone from 5/21.  CCM signed off.  Prostate abscess: CT abdomen confirmed prostate abscess.  Urology consulted and patient underwent cystoscopy, TURP and unroofing of abscess on 5/18.  Continue antibiotics as above.  As per urology follow-up, catheter should remain until fever subsided and there is no gross hematuria, repeat CT scan of prostate some point during this hospitalization to ensure abscess has been completely drained especially if he continues to spike fevers.  Abscess AFB cultures pending.  Improving.  Now that fevers down (last 102.6 on 5/19) and no gross hematuria, consider removing Foley catheter 5/21  Oral and esophageal candidiasis: Continue fluconazole and Magic mouthwash.  Improving  Hypokalemia/hypomagnesemia: Replaced  Pancytopenia: Anemia and leukopenia appear chronic.  Current worsening may be related to sepsis and HIV/AIDS.  No overt bleeding reported.  Follow CBCs daily.  Transfuse if hemoglobin <7 g per DL.  Stable/improved.  Nausea and vomiting:?  Coffee-ground emesis early on but presently only dry heaving and no coffee grounds.  IV Pepcid added.  PRN antiemetics.  If continues to have vomiting or dry heaving, may have to downgrade diet.  No further emesis reported.  HIV/AIDS: Noncompliant with HAART.  ID started patient on Biktarvy.  Severe malnutrition/Body mass index is 22.43 kg/m.  Dietitian consulted.  Hemoptysis: Not sure if he had this.  None since admission.  No cavitary lesions on chest x-ray.  Monitor.  Seems to have resolved.   DVT prophylaxis: SCDs. Code Status: Full Family Communication: None at  bedside Disposition: To to stepdown unit, continue care here.  DC home pending clinical improvement which may take several days.  Monitor in stepdown unit for additional day and then  consider transferring to medical bed 5/21 if stable.   Consultants:  Urology Infectious disease PCCM-signed off 5/20  Procedures:  Cystoscopy, TURP and unroofing of prostatic abscess 5/18 Foley catheter.  Antimicrobials:  IV vancomycin + meropenem 5/18 > IV fluconazole 5/18 >.   Subjective: Overall feels much better, stronger.  Mild discomfort in rectal area after BM.  Denies pain elsewhere.  Throat improving and able to swallow better.  No nausea or vomiting.  Mouth dry.  As per RN, no acute issues reported.  ROS: As above.  Objective:  Vitals:   12/11/17 0900 12/11/17 1000 12/11/17 1100 12/11/17 1200  BP: 114/71 125/76 118/69 111/67  Pulse: 88 72 91 90  Resp: Temp:    98.5 F (36.9 C)  TempSrc:    Oral  SpO2: 100% 100% 99% 100%  Weight:      Height:        Examination:  General exam: Pleasant young male, moderately built, thinly nourished and chronically ill looking lying comfortably propped up in bed.  Oral mucosa moist.  Oral thrush, improving.  Looks better than he did yesterday. Respiratory system: Clear to auscultation. Respiratory effort normal.  Stable. Cardiovascular system: S1 & S2 heard, RRR. No JVD, murmurs, rubs, gallops or clicks. No pedal edema.  Telemetry personally reviewed: Sinus rhythm. Gastrointestinal system: Abdomen is nondistended, soft and nontender. No organomegaly or masses felt. Normal bowel sounds heard.  Stable. GU: Foley catheter in place with straw-colored urine.  No gross hematuria. Central nervous system: Alert and oriented. No focal neurological deficits.  Stable Extremities: Symmetric 5 x 5 power.  Stable Skin: Healing superficial approximately 2 x 1 cm ulcer over right anterior proximal thigh without any acute findings suggestive of wound infection, cellulitis or abscess.  Stable. Psychiatry: Judgement and insight appear normal. Mood & affect appropriate.     Data Reviewed: I have personally reviewed following labs  and imaging studies  CBC: Recent Labs  Lab 12/09/17 1130 12/10/17 0340 12/11/17 0335  WBC 1.5* 1.5* 2.7*  NEUTROABS  --   --  2.3  HGB 10.8* 9.9* 9.1*  HCT 32.9* 29.7* 26.6*  MCV 92.4 90.8 92.0  PLT 207 128* 135*   Basic Metabolic Panel: Recent Labs  Lab 12/09/17 1130 12/10/17 0340 12/11/17 0335  NA 131* 131* 134*  K 3.7 2.9* 4.0  CL 97* 100* 110  CO2 27 21* 18*  GLUCOSE 84 107* 147*  BUN CREATININE 0.62 0.95 0.86  CALCIUM 8.4* 7.7* 7.1*  MG  --  1.3* 1.8   Liver Function Tests: No results for input(s): AST, ALT, ALKPHOS, BILITOT, PROT, ALBUMIN in the last 168 hours.   Recent Results (from the past 240 hour(s))  Aerobic/Anaerobic Culture (surgical/deep wound)     Status: None (Preliminary result)   Collection Time: 12/09/17 11:24 PM  Result Value Ref Range Status   Specimen Description   Final    ABSCESS Performed at [facility], [address], [place], [place] [address]    Special Requests   Final    PROSTATE TISSUE Performed at [facility], [address] [place], [place] [address]    Gram Stain   Final    MODERATE WBC PRESENT, PREDOMINANTLY MONONUCLEAR FEW GRAM POSITIVE COCCI IN PAIRS  Culture   Final    NO GROWTH 1 DAY Performed at [facility], [address] [place], [place] [address]    Report Status PENDING  Incomplete  Aerobic/Anaerobic Culture (surgical/deep wound)     Status: None (Preliminary result)   Collection Time: 12/09/17 11:28 PM  Result Value Ref Range Status   Specimen Description   Final    ABSCESS Performed at [facility], [address] [place], [place] [address]    Special Requests   Final    PROSTATE ABSCESS DRAIN Performed at [facility], [address], [place], [place] [address]    Gram Stain   Final    MODERATE WBC PRESENT, PREDOMINANTLY PMN MODERATE GRAM POSITIVE COCCI    Culture   Final    NO GROWTH 1 DAY Performed  at [facility], [address] [place], [place] [address]    Report Status PENDING  Incomplete  MRSA PCR Screening     Status: None   Collection Time: 12/10/17  1:06 AM  Result Value Ref Range Status   MRSA by PCR NEGATIVE NEGATIVE Final    Comment:        The GeneXpert MRSA Assay (FDA approved for NASAL specimens only), is one component of a comprehensive MRSA colonization surveillance program. It is not intended to diagnose MRSA infection nor to guide or monitor treatment for MRSA infections. Performed at [facility], [address] [place], [place] [address]   Culture, Urine     Status: None   Collection Time: 12/10/17  1:40 AM  Result Value Ref Range Status   Specimen Description   Final    URINE, CLEAN CATCH Performed at [facility], [address] [place], [place] [address]    Special Requests   Final    NONE Performed at [facility], [address] [place], [place] [address]    Culture   Final    NO GROWTH Performed at [facility], [address] [place], [place] [address]    Report Status 12/11/2017 FINAL  Final         Radiology Studies: Ct Abdomen Pelvis W Contrast  Result Date: 12/09/2017 CLINICAL DATA:  Cough, congestion, sore throat, vomiting. LEFT upper quadrant pain. History of HIV/aids and treated syphilis. EXAM: CT ABDOMEN AND PELVIS WITH CONTRAST TECHNIQUE: Multidetector CT imaging of the abdomen and pelvis was performed using the standard protocol following bolus administration of intravenous contrast. CONTRAST:  OMNIPAQUE IOHEXOL 300 MG/ML  SOLN COMPARISON:  CT abdomen and pelvis March 05, 2017 FINDINGS: LOWER CHEST: Mild suspected esophageal wall thickening, partially imaged. Lung bases are clear. Included heart size is normal. No pericardial effusion. HEPATOBILIARY: Liver and gallbladder are normal. PANCREAS: Normal. SPLEEN: Normal. ADRENALS/URINARY TRACT: Kidneys  are orthotopic, demonstrating symmetric enhancement. Mild symmetric nephromegaly. No nephrolithiasis, hydronephrosis or solid renal masses. The unopacified ureters are normal in course and caliber. No urinary bladder is partially distended and unremarkable. Normal adrenal glands. STOMACH/BOWEL: Mild rectal wall thickening and edema. Mild amount of retained large bowel stool. No bowel obstruction. VASCULAR/LYMPHATIC: Aortoiliac vessels are normal in course and caliber. No lymphadenopathy by CT size criteria. Similar greater than expected number of mildly enlarged retroperitoneal, abdominal and inguinal lymph nodes. REPRODUCTIVE: Multilobulated 3.6 x 4.2 x 7.2 cm prostatic abscess, possibly loculated. OTHER: No intraperitoneal free fluid or free air. MUSCULOSKELETAL: Nonacute.  Mild L5-S1 degenerative disc. IMPRESSION: 1. 3.6 x 4.2 x  7.2 cm prostatic abscess, possibly loculated. 2. Mild proctitis and suspected esophagitis. 3. Mild nephromegaly and mild lymphadenopathy, findings associated with HIV. 4. Acute findings discussed with and reconfirmed by Dr.LINDSEY LAYDEN on 12/09/2017 at 6:48 pm. Electronically Signed   By: Awilda Metro M.D.   On: 12/09/2017 18:50        Scheduled Meds: . bictegravir-emtricitabine-tenofovir AF  1 tablet Oral Daily  . famotidine  20 mg Oral BID  . hydrocortisone sod succinate (SOLU-CORTEF) inj  50 mg Intravenous Q6H   Continuous Infusions: . sodium chloride 50 mL/hr at 12/11/17 0854  . fluconazole (DIFLUCAN) IV Stopped (12/10/17 2123)  . meropenem (MERREM) IV Stopped (12/11/17 0729)  . vancomycin 1,000 mg (12/11/17 1157)     LOS: 2 days     Marcellus Scott, MD, FACP, [facility]. [facility] Pager [phone]  If 7PM-7AM, please contact night-coverage [email or web address] Password [facility] 12/11/2017, 12:39 PM
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
## 2017-12-11 NOTE — Progress Notes (Signed)
PULMONARY / CRITICAL CARE MEDICINE   Name: Jacob Gutierrez MRN: [id number] DOB: May 02, 1986    ADMISSION DATE:  12/09/2017 CONSULTATION DATE:  12/10/2017   REFERRING MD:  Dr. Waymon Amato, [facility]  CHIEF COMPLAINT:  Malaise  HISTORY OF PRESENT ILLNESS:   32 yo male with HIV presented with malaise, fever 102F, dyspnea, dysphagia, abdominal pain.  Found to have prostatic abscess and sepsis.  Urology consulted and had TURP 5/18.  Hypotensive post op and PCCM asked to assist with sepsis management.  SUBJECTIVE:  Feels better.  Throat still sore.  Not much appetite, but improved.  Says he needs help reapplying for medicaid.  VITAL SIGNS: BP 115/80   Pulse 90   Temp 98.7 F (37.1 C) (Oral)   Resp 16   Ht  (1.854 m)   Wt 170 lb (77.1 kg)   SpO2 100%   BMI 22.43 kg/m   INTAKE / OUTPUT: I/O last 3 completed shifts: In: 7992.7 [I.V.:4537.9; IV Piggyback:3454.8] Out: 5465 [Urine:5415; Blood:50]  PHYSICAL EXAMINATION:  General - pleasant Eyes - pupils reactive ENT - no sinus tenderness, no oral exudate, no LAN Cardiac - regular, no murmur Chest - no wheeze, rales Abd - soft, non tender Ext - no edema Skin - no rashes Neuro - normal strength Psych - normal mood   LABS:  BMET Recent Labs  Lab 12/09/17 1130 12/10/17 0340 12/11/17 0335  NA 131* 131* 134*  K 3.7 2.9* 4.0  CL 97* 100* 110  CO2 27 21* 18*  BUN CREATININE 0.62 0.95 0.86  GLUCOSE 84 107* 147*    Electrolytes Recent Labs  Lab 12/09/17 1130 12/10/17 0340 12/11/17 0335  CALCIUM 8.4* 7.7* 7.1*  MG  --  1.3* 1.8    CBC Recent Labs  Lab 12/09/17 1130 12/10/17 0340 12/11/17 0335  WBC 1.5* 1.5* 2.7*  HGB 10.8* 9.9* 9.1*  HCT 32.9* 29.7* 26.6*  PLT 207 128* 135*    Coag's No results for input(s): APTT, INR in the last 168 hours.  Sepsis Markers Recent Labs  Lab 12/09/17 1618 12/10/17 1106 12/10/17 1305  LATICACIDVEN 0.52 1.6 2.4*    ABG No results for input(s): PHART,  PCO2ART, PO2ART in the last 168 hours.  Liver Enzymes No results for input(s): AST, ALT, ALKPHOS, BILITOT, ALBUMIN in the last 168 hours.  Cardiac Enzymes No results for input(s): TROPONINI, PROBNP in the last 168 hours.  Glucose No results for input(s): GLUCAP in the last 168 hours.  Imaging No results found.   STUDIES:  CT abd/pelvis 5/18 >> esophageal thickening, atrophic kidneys, 7.2 cm prostatic abscess  CULTURES: Prostate abscess 5/18 >> Blood 5/19 >>   ANTIBIOTICS: Gentamycin 5/18 >> 5/18 Vancomycin 5/18 >> Meropenem 5/18 >> Diflucan 5/18 >>   SIGNIFICANT EVENTS: 5/18 admit, TURP 5/19 Start solu cortef  DISCUSSION: 32 yo male with sepsis from prostatic abscess, esophageal candidiasis, hx of HIV with last CD4 count 10 from 03/06/17.  Hypotension from sepsis, hypovolemia, and relative adrenal insufficiency.  Hemodynamics improved 5/20 and hasn't needed pressors.  ASSESSMENT / PLAN:  Sepsis from prostatic abscess s/p cystoscopy with TURP. - post op care per urology - ABx per ID - continue IV fluids  Relative adrenal insufficiency - cortisol 16.3 from 12/10/17 - continue solu cortef 50 mg q6h today >> if hemodynamics stable, then can start to wean off from 12/12/17  Esophageal candidiasis. - diflucan, magic mouth wash  HIV/AIDS. - per ID - he will need assistance with reapplying  for medicaid to get his medications  Hypokalemia, hypomagnesemia. - replace as needed  Severe protein calorie malnutrition. - optimize nutritional status  Leukopenia from sepsis. Anemia, thrombocytopenia of critical illness. - f/u CBC  DVT prophylaxis - SCD SUP - pepcid Nutrition - regular diet Goals of care - full code  PCCM will sign off.  Please call if additional help needed while he is in hospital.  Coralyn Helling, MD [facility] Pulmonary/Critical Care 12/11/2017, 9:06 AM

## 2017-12-12 DIAGNOSIS — D696 Thrombocytopenia, unspecified: Secondary | ICD-10-CM

## 2017-12-12 DIAGNOSIS — K208 Other esophagitis: Secondary | ICD-10-CM

## 2017-12-12 DIAGNOSIS — E43 Unspecified severe protein-calorie malnutrition: Secondary | ICD-10-CM

## 2017-12-12 DIAGNOSIS — Z881 Allergy status to other antibiotic agents status: Secondary | ICD-10-CM

## 2017-12-12 LAB — CBC WITH DIFFERENTIAL/PLATELET
BASOS ABS: 0 10*3/uL (ref 0.0–0.1)
Basophils Relative: 0 %
Eosinophils Absolute: 0.1 10*3/uL (ref 0.0–0.7)
Eosinophils Relative: 6 %
HEMATOCRIT: 26.9 % — AB (ref 39.0–52.0)
HEMOGLOBIN: 9.2 g/dL — AB (ref 13.0–17.0)
LYMPHS PCT: 13 %
Lymphs Abs: 0.2 10*3/uL — ABNORMAL LOW (ref 0.7–4.0)
MCH: 31.2 pg (ref 26.0–34.0)
MCHC: 34.2 g/dL (ref 30.0–36.0)
MCV: 91.2 fL (ref 78.0–100.0)
MONOS PCT: 7 %
Monocytes Absolute: 0.1 10*3/uL (ref 0.1–1.0)
NEUTROS ABS: 1.4 10*3/uL — AB (ref 1.7–7.7)
Neutrophils Relative %: 74 %
Platelets: 128 10*3/uL — ABNORMAL LOW (ref 150–400)
RBC: 2.95 MIL/uL — ABNORMAL LOW (ref 4.22–5.81)
RDW: 14.8 % (ref 11.5–15.5)
WBC: 1.8 10*3/uL — ABNORMAL LOW (ref 4.0–10.5)

## 2017-12-12 LAB — BASIC METABOLIC PANEL
ANION GAP: 9 (ref 5–15)
BUN: 15 mg/dL (ref 6–20)
CHLORIDE: 105 mmol/L (ref 101–111)
CO2: 19 mmol/L — AB (ref 22–32)
Calcium: 8.1 mg/dL — ABNORMAL LOW (ref 8.9–10.3)
Creatinine, Ser: 0.9 mg/dL (ref 0.61–1.24)
GFR calc Af Amer: >60 mL/min (ref >60)
GLUCOSE: 150 mg/dL — AB (ref 65–99)
POTASSIUM: 3.2 mmol/L — AB (ref 3.5–5.1)
Sodium: 133 mmol/L — ABNORMAL LOW (ref 135–145)

## 2017-12-12 LAB — TSH: TSH: 0.684 u[IU]/mL (ref 0.350–4.500)

## 2017-12-12 MED ORDER — ENSURE ENLIVE PO LIQD
237.0000 mL | Freq: Three times a day (TID) | ORAL | Status: DC
  Administered 2017-12-12 (×2): 237 mL via ORAL

## 2017-12-12 MED ORDER — POTASSIUM CHLORIDE CRYS ER 20 MEQ PO TBCR
40.0000 meq | EXTENDED_RELEASE_TABLET | Freq: Once | ORAL | Status: AC
  Administered 2017-12-12: 40 meq via ORAL
  Filled 2017-12-12: qty 2

## 2017-12-12 MED ORDER — HYDROCORTISONE NA SUCCINATE PF 100 MG IJ SOLR
50.0000 mg | Freq: Two times a day (BID) | INTRAMUSCULAR | Status: DC
  Administered 2017-12-12 – 2017-12-14 (×5): 50 mg via INTRAVENOUS
  Filled 2017-12-12 (×5): qty 2

## 2017-12-12 MED ORDER — ADULT MULTIVITAMIN W/MINERALS CH
1.0000 | ORAL_TABLET | Freq: Every day | ORAL | Status: DC
  Administered 2017-12-12 – 2017-12-14 (×3): 1 via ORAL
  Filled 2017-12-12 (×4): qty 1

## 2017-12-12 MED ORDER — SODIUM CHLORIDE 0.9 % IV SOLN
1250.0000 mg | Freq: Two times a day (BID) | INTRAVENOUS | Status: DC
  Administered 2017-12-12 – 2017-12-14 (×5): 1250 mg via INTRAVENOUS
  Filled 2017-12-12 (×7): qty 1250

## 2017-12-12 NOTE — Progress Notes (Signed)
Initial Nutrition Assessment  DOCUMENTATION CODES:   Severe malnutrition in context of chronic illness, Severe malnutrition in context of social or environmental circumstances  INTERVENTION:  - Will order Ensure Enlive TID, each supplement provides 350 kcal and 20 grams of protein - Will order daily multivitamin with minerals. - Continue to encourage PO intakes.   NUTRITION DIAGNOSIS:   Severe Malnutrition related to chronic illness, social / environmental circumstances(HIV/AIDS) as evidenced by severe fat depletion, severe muscle depletion, moderate fat depletion, moderate muscle depletion.  GOAL:   Patient will meet greater than or equal to 90% of their needs  MONITOR:   PO intake, Supplement acceptance, Weight trends, Labs  REASON FOR ASSESSMENT:   Malnutrition Screening Tool  ASSESSMENT:   32 year old male with PMH of HIV/AIDS, last CD4 count was 10, has been off of HAART for the past year, Coccidiomycosis, gastroenteritis due to Cryptosporidium, ESBL UTI, MRSA, abscess December 2018, treated syphilis, recent right groin abscess I&D then treated with antibiotics which he did not complete, numerous other infectious issues. He presented to ED with complaints of throat pain, difficulty swallowing liquids and solids, nonbloody emesis.  Initially coffee-ground and then dry heaving only, difficulty breathing, nonproductive cough, fever up to 102 F, abdominal pain admitted for sepsis due to prostatic abscess.  Urology performed TURP 5/18 and Foley catheter discontinued 5/21.    BMI indicates normal weight. Per chart review, pt consumed 75% of breakfast and lunch and 50% of dinner yesterday. Pt reports 2 days of severe throat pain PTA and that this caused him to "eat like a bird." He reports that he is now able to eat well and has been feeling hungry as throat pain has resolved. He denies every experiencing this kind of issue/pain in the past. He indicates that PTA he was on food stamps  but that he has not re-enrolled for EBT so he has been without this for "awhile" which has caused food insecurity for him. He denies any abdominal pain or nausea now or PTA. He is agreeable to receiving Ensure supplements and prefers not to receive chocolate flavor.   NFPE outlined below. Pt does not weigh himself at home. He is unsure of UBW or amount of weight lost but states that he has lost weight because his clothes are now baggy. He states weight loss has occurred over the past few weeks, <1 month.   Per Dr. 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  Medications reviewed; 20 mg oral Pepcid BID, 50 mg Solu-Cortef BID, 40 mEq KCl x1 today.  Labs reviewed; Na: 133 mmol/L, K: 3.2 mmol/L, Ca: 8.1 mg/dL.      NUTRITION - FOCUSED PHYSICAL EXAM:    Most Recent Value  Orbital Region  Mild depletion  Upper Arm Region  Severe depletion  Thoracic and Lumbar Region  Unable to assess  Buccal Region  Moderate depletion  Temple Region  Mild depletion  Clavicle Bone Region  Severe depletion  Clavicle and Acromion Bone Region  Severe depletion  Scapular Bone Region  Moderate depletion  Dorsal Hand  Mild depletion  Patellar Region  No depletion  Anterior Thigh Region  No depletion  Posterior Calf Region  No depletion  Edema (RD Assessment)  Mild  Hair  Reviewed  Eyes  Reviewed  Mouth  Reviewed  Skin  Reviewed  Nails  Reviewed  Diet Order:   Diet Order           Diet regular Room service appropriate? Yes; Fluid consistency: Thin  Diet effective now          EDUCATION NEEDS:   No education needs have been identified at this time  Skin:  Skin Assessment: Reviewed RN Assessment  Last BM:  5/20  Height:   Ht Readings from Last 1 Encounters:  12/09/17  (1.854 m)     Weight:   Wt Readings from Last 1 Encounters:  12/09/17 170 lb (77.1 kg)    Ideal Body Weight:  83.64 kg  BMI:  Body mass index is 22.43 kg/m.  Estimated Nutritional Needs:   Kcal:  2315-2540 (30-33 kcal/kg)  Protein:  123-140 grams (1.6-1.8 grams/kg)  Fluid:  >/= 2.3 L/day     Trenton Gammon, MS, RD, LDN, [facility] Inpatient Clinical Dietitian Pager # [phone] After hours/weekend pager # [phone]

## 2017-12-12 NOTE — Progress Notes (Signed)
Patient transferred from ICU/Stepdown. Alert and Oriented x4. No complaints of pain or nausea at this time. RN agrees with assessment done by previous nurse. RN will continue to monitor the patient.

## 2017-12-12 NOTE — Progress Notes (Signed)
Subjective: No new complaints Antibiotics:  Anti-infectives (From admission, onward)   Start     Dose/Rate Route Frequency Ordered Stop   12/10/17 1600  bictegravir-emtricitabine-tenofovir AF (BIKTARVY) 50-200-25 MG per tablet 1 tablet     1 tablet Oral Daily 12/10/17 1505     12/10/17 0400  meropenem (MERREM) 1 g in sodium chloride 0.9 % 100 mL IVPB  Status:  Discontinued     1 g 200 mL/hr over 30 Minutes Intravenous Every 8 hours 12/10/17 0208 12/12/17 0916   12/10/17 0300  vancomycin (VANCOCIN) IVPB 1000 mg/200 mL premix     1,000 mg 200 mL/hr over 60 Minutes Intravenous Every 8 hours 12/09/17 1912     12/09/17 2200  gentamicin (GARAMYCIN) 390 mg in dextrose 5 % 100 mL IVPB  Status:  Discontinued     5 mg/kg  77.1 kg 219.5 mL/hr over 30 Minutes Intravenous Every 24 hours 12/09/17 2146 12/09/17 2339   12/09/17 2000  fluconazole (DIFLUCAN) IVPB 200 mg     200 mg 100 mL/hr over 60 Minutes Intravenous Every 24 hours 12/09/17 1915     12/09/17 1915  vancomycin (VANCOCIN) IVPB 1000 mg/200 mL premix     1,000 mg 200 mL/hr over 60 Minutes Intravenous  Once 12/09/17 1906 12/09/17 2057   12/09/17 1915  vancomycin (VANCOCIN) 500 mg in sodium chloride 0.9 % 100 mL IVPB     500 mg 100 mL/hr over 60 Minutes Intravenous  Once 12/09/17 1912 12/09/17 2057      Medications: Scheduled Meds: . bictegravir-emtricitabine-tenofovir AF  1 tablet Oral Daily  . famotidine  20 mg Oral BID  . hydrocortisone sod succinate (SOLU-CORTEF) inj  50 mg Intravenous Q12H   Continuous Infusions: . fluconazole (DIFLUCAN) IV Stopped (12/11/17 2032)  . vancomycin Stopped (12/12/17 0401)   PRN Meds:.acetaminophen **OR** acetaminophen, magic mouthwash w/lidocaine, ondansetron **OR** ondansetron (ZOFRAN) IV, oxybutynin    Objective: Weight change:   Intake/Output Summary (Last 24 hours) at 12/12/2017 0917 Last data filed at 12/12/2017 0800 Gross per 24 hour  Intake 2263.33 ml  Output 4315 ml    Net -2051.67 ml   Blood pressure (!) 118/94, pulse (!) 50, temperature (!) 97.4 F (36.3 C), temperature source Oral, resp. rate 12, height  (1.854 m), weight 170 lb (77.1 kg), SpO2 99 %. Temp:  [97.4 F (36.3 C)-98.5 F (36.9 C)] 97.4 F (36.3 C) (05/21 0744) Pulse Rate:  [48-91] 50 (05/21 0600) Resp:  [12-26] 12 (05/21 0600) BP: (111-135)/(67-94) 118/94 (05/21 0408) SpO2:  [98 %-100 %] 99 % (05/21 0600)  Physical Exam: General: Alert and awake, oriented x3, not in any acute distress. Having foley removed Neuro: nonfocal  CBC: CBC Latest Ref Rng & Units 12/12/2017 12/11/2017 12/10/2017  WBC 4.0 - 10.5 K/uL 1.8(L) 2.7(L) 1.5(L)  Hemoglobin 13.0 - 17.0 g/dL 1.6(X) 0.9(U) 0.4(V)  Hematocrit 39.0 - 52.0 % 26.9(L) 26.6(L) 29.7(L)  Platelets 150 - 400 K/uL 128(L) 135(L) 128(L)      BMET Recent Labs    12/11/17 0335 12/12/17 0522  NA 134* 133*  K 4.0 3.2*  CL 110 105  CO2 18* 19*  GLUCOSE 147* 150*  BUN 13 15  CREATININE 0.86 0.90  CALCIUM 7.1* 8.1*     Liver Panel  No results for input(s): PROT, ALBUMIN, AST, ALT, ALKPHOS, BILITOT, BILIDIR, IBILI in the last 72 hours.     Sedimentation Rate No results for input(s): ESRSEDRATE in the last 72 hours. C-Reactive Protein  No results for input(s): CRP in the last 72 hours.  Micro Results: Recent Results (from the past 720 hour(s))  Aerobic/Anaerobic Culture (surgical/deep wound)     Status: None (Preliminary result)   Collection Time: 12/09/17 11:24 PM  Result Value Ref Range Status   Specimen Description   Final    ABSCESS Performed at [facility] [address], [place], [place] [address]    Special Requests   Final    PROSTATE TISSUE Performed at [facility], [address] [place], [place] [address]    Gram Stain   Final    MODERATE WBC PRESENT, PREDOMINANTLY MONONUCLEAR FEW GRAM POSITIVE COCCI IN PAIRS    Culture   Final    NO GROWTH 1 DAY Performed at  [facility], [address], [place], [place] [address]    Report Status PENDING  Incomplete  Acid Fast Smear (AFB)     Status: None   Collection Time: 12/09/17 11:24 PM  Result Value Ref Range Status   AFB Specimen Processing Concentration  Final   Acid Fast Smear Negative  Final    Comment: (NOTE) Performed At: [facility] [address] [place], [place] [id number] Jolene Schimke MD OZ:[id number]    Source (AFB) TISSUE  Final    Comment: Performed at [facility], [address] [place], [place] [address]  Aerobic/Anaerobic Culture (surgical/deep wound)     Status: None (Preliminary result)   Collection Time: 12/09/17 11:28 PM  Result Value Ref Range Status   Specimen Description   Final    ABSCESS Performed at [facility], [address], [place], [place] [address]    Special Requests   Final    PROSTATE ABSCESS DRAIN Performed at [facility], [address], [place], [place] [address]    Gram Stain   Final    MODERATE WBC PRESENT, PREDOMINANTLY PMN MODERATE GRAM POSITIVE COCCI    Culture   Final    NO GROWTH 1 DAY Performed at [facility], [address] [place], [place] [address]    Report Status PENDING  Incomplete  MRSA PCR Screening     Status: None   Collection Time: 12/10/17  1:06 AM  Result Value Ref Range Status   MRSA by PCR NEGATIVE NEGATIVE Final    Comment:        The GeneXpert MRSA Assay (FDA approved for NASAL specimens only), is one component of a comprehensive MRSA colonization surveillance program. It is not intended to diagnose MRSA infection nor to guide or monitor treatment for MRSA infections. Performed at [facility], [address] [place], [place] [address]   Culture, Urine     Status: None   Collection Time: 12/10/17  1:40 AM  Result Value Ref Range Status   Specimen Description   Final    URINE, CLEAN CATCH Performed at [facility], [address], [place], [place] [address]    Special Requests   Final    NONE Performed at [facility], [address], [place], [place] [address]    Culture   Final    NO GROWTH Performed at [facility], [address] [place], [place] [address]    Report Status 12/11/2017 FINAL  Final  Culture, blood (Routine X 2) w Reflex to ID Panel     Status: None (Preliminary result)   Collection Time: 12/10/17  8:36  AM  Result Value Ref Range Status   Specimen Description   Final    BLOOD RIGHT ANTECUBITAL Performed at [facility], [address], [place], [place] [address]    Special Requests   Final    BOTTLES DRAWN AEROBIC ONLY Blood Culture results may not be optimal due to an inadequate volume of blood received in culture bottles Performed at [facility], [address] [place], [place] [address]    Culture   Final    NO GROWTH < 24 HOURS Performed at [facility], [address], [place], [place] [address]    Report Status PENDING  Incomplete  Culture, blood (Routine X 2) w Reflex to ID Panel     Status: None (Preliminary result)   Collection Time: 12/10/17  8:37 AM  Result Value Ref Range Status   Specimen Description   Final    BLOOD BLOOD LEFT HAND Performed at [facility], [address], [place], [place] [address]    Special Requests   Final    BOTTLES DRAWN AEROBIC ONLY Blood Culture adequate volume Performed at [facility], [address] [place], [place] [address]    Culture   Final    NO GROWTH < 24 HOURS Performed at [facility], [address], [place], [place] [address]    Report Status PENDING  Incomplete    Studies/Results: No results found.    Assessment/Plan:  INTERVAL HISTORY: pt not growing an organism but seems likely an Enterococcus   Principal Problem:   Sepsis (HCC) Active Problems:   AIDS  (acquired immune deficiency syndrome) (HCC)   Personal history of MRSA (methicillin resistant Staphylococcus aureus)   Prostatitis, acute   History of ESBL E. coli infection   Oral candida   Hemoptysis   Prostate abscess    Jacob Gutierrez is a 32 y.o. male with HIV and AIDS is not been compliant with his antiretroviral regimen admitted now with prostate abscess status post transurethral resection of the prostate with cultures having been sent with Gram stain showing moderate gram-positive cocci in pairs but cultures so far not yielding an organism.  Patient has been on meropenem and vancomycin is improving hemodynamically  1.  Prostate abscess: likely an enterococcal infection Narrow to vancomycin in patient. Isse will be at DC. Patient is allergic to Amoxicillin--uncertain allergy from chldhood but had desensitization done in past  Zyvox does not seem likely viable given his already being thrombocytopenic  Could consider trying to give him ORITAVANCIN dose though not FDA approved for prostate abscess but rather cellulitis (if he has an area that would be considered to be cellulitis that will line up. Will discuss with ID pharmacy  2. HIV/AIDS: On BIKTARVY  I am SKEPTICAL he has active HMAP. Will ask Marcelino Duster from [facility] to see if he does. We have probably already used BIKTARVY 30 day once per lifetime on him. It is possible with leg work we might be able to procure meds or give him a different regimen for 30 days if he lacks HMAP     LOS: 3 days   Acey Lav 12/12/2017, 9:17 AM

## 2017-12-12 NOTE — Progress Notes (Signed)
Urology Inpatient Progress Report  Prostate abscess [N41.2] Esophagitis [K20.9]  Procedure(s): TRANSURETHRAL RESECTION OF THE PROSTATE (TURP)  3 Days Post-Op   Intv/Subj: No acute events overnight. Patient is without complaint. Urine clear Feels better No fevers  Principal Problem:   Sepsis (HCC) Active Problems:   AIDS (acquired immune deficiency syndrome) (HCC)   Personal history of MRSA (methicillin resistant Staphylococcus aureus)   Prostatitis, acute   History of ESBL E. coli infection   Oral candida   Hemoptysis   Prostate abscess  Current Facility-Administered Medications  Medication Dose Route Frequency Provider Last Rate Last Dose  . 0.9 %  sodium chloride infusion   Intravenous Continuous Elease Etienne, MD 50 mL/hr at 12/12/17 0600    . acetaminophen (TYLENOL) tablet 650 mg  650 mg Oral Q6H PRN Hillary Bow, DO   650 mg at 12/11/17 1947   Or  . acetaminophen (TYLENOL) suppository 650 mg  650 mg Rectal Q6H PRN Hillary Bow, DO      . bictegravir-emtricitabine-tenofovir AF (BIKTARVY) 50-200-25 MG per tablet 1 tablet  1 tablet Oral Daily Ginnie Smart, MD   1 tablet at 12/11/17 [phone]  . famotidine (PEPCID) tablet 20 mg  20 mg Oral BID Coralyn Helling, MD   20 mg at 12/11/17 2119  . fluconazole (DIFLUCAN) IVPB 200 mg  200 mg Intravenous Q24H Claudie Leach, [place]   Stopped at 12/11/17 2032  . hydrocortisone sodium succinate (SOLU-CORTEF) 100 MG injection 50 mg  50 mg Intravenous Q6H Coralyn Helling, MD   50 mg at 12/12/17 0601  . magic mouthwash w/lidocaine  5 mL Oral TID PRN Coralyn Helling, MD   5 mL at 12/10/17 2149  . meropenem (MERREM) 1 g in sodium chloride 0.9 % 100 mL IVPB  1 g Intravenous Q8H Lorenza Evangelist, RPH   Stopped at 12/12/17 0630  . ondansetron (ZOFRAN) tablet 4 mg  4 mg Oral Q6H PRN Hillary Bow, DO       Or  . ondansetron [facility]) injection 4 mg  4 mg Intravenous Q6H PRN Hillary Bow, DO   4 mg at 12/10/17 0352  . oxybutynin  (DITROPAN) tablet 5 mg  5 mg Oral TID PRN Narang, Gopal L, MD      . potassium chloride SA (K-DUR,KLOR-CON) CR tablet 40 mEq  40 mEq Oral Once Marcellus Scott D, MD      . vancomycin (VANCOCIN) IVPB 1000 mg/200 mL premix  1,000 mg Intravenous Q8H Claudie Leach, [place]   Stopped at 12/12/17 0401     Objective: Vital: Vitals:   12/12/17 0408 12/12/17 0500 12/12/17 0600 12/12/17 0744  BP: (!) 118/94     Pulse:  (!) 48 (!) 50   Resp: Temp:    (!) 97.4 F (36.3 C)  TempSrc:    Oral  SpO2:  98% 99%   Weight:      Height:       I/Os: I/O last 3 completed shifts: In: 4213.3 [P.O.:720; I.V.:1993.3; IV Piggyback:1500] Out: 8510 [Urine:8510]  Physical Exam:  General: Patient is in no apparent distress Lungs: Normal respiratory effort, chest expands symmetrically. Foley: clear urine  Ext: lower extremities symmetric  Lab Results: Recent Labs    12/10/17 0340 12/11/17 0335 12/12/17 0522  WBC 1.5* 2.7* 1.8*  HGB 9.9* 9.1* 9.2*  HCT 29.7* 26.6* 26.9*   Recent Labs    12/10/17 0340 12/11/17 0335 12/12/17 0522  NA 131* 134* 133*  K 2.9* 4.0 3.2*  CL 100* 110 105  CO2 21* 18* 19*  GLUCOSE 107* 147* 150*  BUN CREATININE 0.95 0.86 0.90  CALCIUM 7.7* 7.1* 8.1*   No results for input(s): LABPT, INR in the last 72 hours. No results for input(s): LABURIN in the last 72 hours. Results for orders placed or performed during the hospital encounter of 12/09/17  Aerobic/Anaerobic Culture (surgical/deep wound)     Status: None (Preliminary result)   Collection Time: 12/09/17 11:24 PM  Result Value Ref Range Status   Specimen Description   Final    ABSCESS Performed at [facility], [address], [place], [place] [address]    Special Requests   Final    PROSTATE TISSUE Performed at [facility], [address], [place], [place] [address]    Gram Stain   Final    MODERATE WBC PRESENT, PREDOMINANTLY MONONUCLEAR FEW GRAM  POSITIVE COCCI IN PAIRS    Culture   Final    NO GROWTH 1 DAY Performed at [facility], [address] [place], [place] [address]    Report Status PENDING  Incomplete  Acid Fast Smear (AFB)     Status: None   Collection Time: 12/09/17 11:24 PM  Result Value Ref Range Status   AFB Specimen Processing Concentration  Final   Acid Fast Smear Negative  Final    Comment: (NOTE) Performed At: [facility] [address] [place], [place] [id number] Jolene Schimke MD OZ:[id number]    Source (AFB) TISSUE  Final    Comment: Performed at [facility], [address] [place], [place] [address]  Aerobic/Anaerobic Culture (surgical/deep wound)     Status: None (Preliminary result)   Collection Time: 12/09/17 11:28 PM  Result Value Ref Range Status   Specimen Description   Final    ABSCESS Performed at [facility], [address], [place], [place] [address]    Special Requests   Final    PROSTATE ABSCESS DRAIN Performed at [facility], [address] [place], [place] [address]    Gram Stain   Final    MODERATE WBC PRESENT, PREDOMINANTLY PMN MODERATE GRAM POSITIVE COCCI    Culture   Final    NO GROWTH 1 DAY Performed at [facility], [address], [place], [place] [address]    Report Status PENDING  Incomplete  MRSA PCR Screening     Status: None   Collection Time: 12/10/17  1:06 AM  Result Value Ref Range Status   MRSA by PCR NEGATIVE NEGATIVE Final    Comment:        The GeneXpert MRSA Assay (FDA approved for NASAL specimens only), is one component of a comprehensive MRSA colonization surveillance program. It is not intended to diagnose MRSA infection nor to guide or monitor treatment for MRSA infections. Performed at [facility], [address] [place], [place] [address]   Culture, Urine     Status: None   Collection Time: 12/10/17  1:40 AM  Result Value Ref Range  Status   Specimen Description   Final    URINE, CLEAN CATCH Performed at [facility], [address], [place], [place] [address]    Special Requests   Final    NONE Performed at [facility], [address], [place], [place] [address]    Culture   Final    NO  GROWTH Performed at [facility], [address] [place], [place] [address]    Report Status 12/11/2017 FINAL  Final  Culture, blood (Routine X 2) w Reflex to ID Panel     Status: None (Preliminary result)   Collection Time: 12/10/17  8:36 AM  Result Value Ref Range Status   Specimen Description   Final    BLOOD RIGHT ANTECUBITAL Performed at [facility], [address], [place], [place] [address]    Special Requests   Final    BOTTLES DRAWN AEROBIC ONLY Blood Culture results may not be optimal due to an inadequate volume of blood received in culture bottles Performed at [facility], [address], [place], [place] [address]    Culture   Final    NO GROWTH < 24 HOURS Performed at [facility], [address] [place], [place] [address]    Report Status PENDING  Incomplete  Culture, blood (Routine X 2) w Reflex to ID Panel     Status: None (Preliminary result)   Collection Time: 12/10/17  8:37 AM  Result Value Ref Range Status   Specimen Description   Final    BLOOD BLOOD LEFT HAND Performed at [facility], [address] [place], [place] [address]    Special Requests   Final    BOTTLES DRAWN AEROBIC ONLY Blood Culture adequate volume Performed at [facility], [address], [place], [place] [address]    Culture   Final    NO GROWTH < 24 HOURS Performed at [facility], [address], [place], [place] [address]    Report Status PENDING  Incomplete    Studies/Results: No results found.  Assessment: Procedure(s): TRANSURETHRAL RESECTION OF THE PROSTATE (TURP), 3 Days  Post-Op  doing well.  Plan: D/c foley Monitor UOP - BS if no urine output x 6 hours Will follow-up in clinic with patient in 6 weeks.   Berniece Salines, MD Urology 12/12/2017, 7:59 AM

## 2017-12-12 NOTE — Progress Notes (Signed)
MD made aware of patient's continuous HR in 40s-50s. MD okay to continue with current plan and transfer to med-surg bed today.  Will continue to monitor.

## 2017-12-12 NOTE — Progress Notes (Signed)
Pharmacy Antibiotic Note  Jacob Gutierrez is a 32 y.o. male admitted on 12/09/2017 with cough, congestion, chills, black sputum and sore throat. Pharmacy has been consulted for Vancomycin for prostate abscess and Fluconazole dosing for esophageal candidiasis.  PMH: AIDS, Shigella infection, Ecoli enteritis, Syphilis, Tobacco use, dysplasia of anus (history obtained from [facility] records).  Today, 12/12/2017:  SCr stable wnl  Remains afebrile  Meropenem discontinued by ID  Prostate abscess x 2 with GPCs on both Gm stains, but no growth on cultures to date - ID suspecting enterococcus  Notable for amoxicillin allergy; unknown childhood rxn but states he received a penicillin at [facility] several years ago following desensitization protocol.  Plan:  Continue Fluconazole  IV q24  Adjust vancomycin to 1250 q12 hr (AUC 445 w/ SCr 0.9)   Height:  (185.4 cm) Weight: 170 lb (77.1 kg) IBW/kg (Calculated) : 79.9  Temp (24hrs), Avg:98.2 F (36.8 C), Min:97.4 F (36.3 C), Max:98.5 F (36.9 C)  Recent Labs  Lab 12/09/17 1130 12/09/17 1618 12/10/17 0340 12/10/17 1106 12/10/17 1305 12/11/17 0335 12/12/17 0522  WBC 1.5*  --  1.5*  --   --  2.7* 1.8*  CREATININE 0.62  --  0.95  --   --  0.86 0.90  LATICACIDVEN  --  0.52  --  1.6 2.4*  --   --     Estimated Creatinine Clearance: 126.1 mL/min (by C-G formula based on SCr of 0.9 mg/dL).    Allergies  Allergen Reactions  . Amoxicillin Other (See Comments)    From childhood: "I had a reaction when i was little." (??)   Antimicrobials this admission: 5/18 vancomycin >>  5/18 fluconazole >> 5/19 meropenem >> 5/21  Dose adjustments this admission: 5/21 reduce vancomycin to 1250 q12  Microbiology results: 5/19 MRSA PCR neg 5/19 Ucx>>sent 5/18 prostate abscess> mod GPC 5/19 BCx2>> 5/18 prostate tissue>>few GPC in pairs   Thank you for allowing pharmacy to be part of this patient's care team.  Bernadene Person, PharmD, BCPS [phone] 12/12/2017, 12:35 PM

## 2017-12-12 NOTE — Progress Notes (Signed)
PROGRESS NOTE   Jacob Gutierrez  ZOX:[id number]    DOB: 23-Mar-1986    DOA: 12/09/2017  PCP: Grayce Sessions, NP   I have briefly reviewed patients previous medical records in [facility].  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  Initially coffee-ground and then dry heaving only, difficulty breathing, nonproductive cough, fever up to 102 F, abdominal pain admitted for sepsis due to prostatic abscess.  Urology performed TURP 5/18.  ID consulted.  CCM consulted for hypotension and concern for impending shock, signed off 5/20.  Foley catheter discontinued 5/21.  Clinically improved.  Transferring to medical bed 5/21.  Possible discharge home in the next 1-2 days.   Assessment & Plan:   Principal Problem:   Sepsis (HCC) Active Problems:   AIDS (acquired immune deficiency syndrome) (HCC)   Personal history of MRSA (methicillin resistant Staphylococcus aureus)   Prostatitis, acute   History of ESBL E. coli infection   Oral candida   Hemoptysis   Prostate abscess   Severe sepsis: Likely due to acute prostatitis with prostatic abscess.  Status post TURP by urology on 5/18.  Empirically started on IV vancomycin and meropenem.  Abscess Gram stain shows gram-positive cocci in pairs, culture negative to date.  MRSA PCR negative.  No blood cultures were drawn on admission, drawn 5/19 are negative to date.  Urine culture negative.  Abscess AFB smear negative.  AFB & fungal culture pending. On morning of 5/19, hypotensive in the mid 70s, tachycardic, febrile up to  103 F.  Treated aggressively with IV fluids, almost 6 L crystalloids, lactate 1.6 > 2.4, stress dose steroids started, did not require pressors.  Sepsis features resolved.  CCM consulted and signed off.  ID follow-up appreciated.  Discussed with Dr. Daiva Eves >suspects enterococcal infection, discontinued meropenem, continue vancomycin but still not clear on antibiotic choice at discharge (allergic to amoxicillin, thrombocytopenic hence Zyvox may not be an option, could consider oritavancin but not FDA approved for prostatic abscess).  Weaning stress dose steroids.  Prostate abscess: CT abdomen confirmed prostate abscess.  Urology consulted and patient underwent cystoscopy, TURP on 5/18.  Continue antibiotics as above.  Urology follow-up appreciated.  Discussed with Dr. Marlou Porch >Foley catheter discontinued 5/21, mild hematuria after removal as expected, outpatient follow-up with him in 6 weeks.  Improved.  Oral and esophageal candidiasis: Continue fluconazole and Magic mouthwash.  Improving  Hypokalemia/hypomagnesemia: Replace and follow.  Pancytopenia: Anemia and leukopenia appear chronic.  Current worsening may be related to sepsis and HIV/AIDS.  No overt bleeding reported.  Follow CBCs daily.  Transfuse if hemoglobin <7 g per DL.  Fluctuating but stable.  Follow CBCs.  Nausea and vomiting:?  Coffee-ground emesis early on but presently only dry heaving and no coffee grounds.  IV Pepcid added.  PRN antiemetics.  If continues to have vomiting or dry heaving, may have to downgrade diet.  Resolved.  HIV/AIDS: Noncompliant with HAART.  ID started patient on Biktarvy.  Absolute CD4 count <10.  HIV 1 RNA PCR pending.  Severe malnutrition/Body mass index is 22.43 kg/m.  Dietitian consulted.  Hemoptysis: Not sure if he  had this.  None since admission.  No cavitary lesions on chest x-ray.  Monitor.  Resolved.  Asymptomatic sinus bradycardia: Noted on telemetry.  TSH 0.684.  Hyponatremia: Clinically  euvolemic.?  SIADH related to HIV/AIDS.  TSH normal.  Stable.   DVT prophylaxis: SCDs. Code Status: Full Family Communication: None at bedside Disposition: Was admitted to stepdown unit.  Clinically improved.  Transferred to telemetry 5/21.  DC home possibly in the next 1-2 days.   Consultants:  Urology Infectious disease PCCM-signed off 5/20  Procedures:  Cystoscopy, TURP and unroofing of prostatic abscess 5/18 Foley catheter-discontinued 5/21.  Antimicrobials:  IV vancomycin 5/18 > IV meropenem 5/18-5/21. IV fluconazole 5/18 >.   Subjective: Overall continues to feel better.  Mild mouth pain but swallowing has improved.  No penile/rectal pain reported.  Still had Foley catheter this morning but was about to come out.  No dizziness, lightheadedness, chest pain or dyspnea.  No vomiting or coughing up of blood reported.  As per RN, no acute issues noted.  ROS: As above.  Objective:  Vitals:   12/12/17 0800 12/12/17 0848 12/12/17 1054 12/12/17 1141  BP:  (!) 113/46 (!) 132/91   Pulse: (!) 58 (!) 53    Resp: Temp:    98.5 F (36.9 C)  TempSrc:    Oral  SpO2: 100% 100%    Weight:      Height:        Examination:  General exam: Pleasant young male, moderately built, thinly nourished and chronically ill looking sitting up comfortably in bed.  Oral mucosa moist.  Oral thrush, improving.   Respiratory system: Clear to auscultation. Respiratory effort normal.  Stable Cardiovascular system: S1 & S2 heard, RRR. No JVD, murmurs, rubs, gallops or clicks. No pedal edema.  Telemetry personally reviewed: Intermittent sinus bradycardia overnight and early this morning mostly in the 50s, occasionally in the low 40s but asymptomatic.  Sinus rhythm. Gastrointestinal system: Abdomen is nondistended, soft and nontender. No organomegaly or masses felt. Normal bowel sounds heard.  Stable GU: Foley catheter in place with straw-colored urine.  No gross hematuria.  Stable Central  nervous system: Alert and oriented. No focal neurological deficits.  Stable Extremities: Symmetric 5 x 5 power.  Stable Skin: Healing superficial approximately 2 x 1 cm ulcer over right anterior proximal thigh without any acute findings suggestive of wound infection, cellulitis or abscess.  Stable. Psychiatry: Judgement and insight appear normal. Mood & affect appropriate.     Data Reviewed: I have personally reviewed following labs and imaging studies  CBC: Recent Labs  Lab 12/09/17 1130 12/10/17 0340 12/11/17 0335 12/12/17 0522  WBC 1.5* 1.5* 2.7* 1.8*  NEUTROABS  --   --  2.3 1.4*  HGB 10.8* 9.9* 9.1* 9.2*  HCT 32.9* 29.7* 26.6* 26.9*  MCV 92.4 90.8 92.0 91.2  PLT 207 128* 135* 128*   Basic Metabolic Panel: Recent Labs  Lab 12/09/17 1130 12/10/17 0340 12/11/17 0335 12/12/17 0522  NA 131* 131* 134* 133*  K 3.7 2.9* 4.0 3.2*  CL 97* 100* 110 105  CO2 27 21* 18* 19*  GLUCOSE 84 107* 147* 150*  BUN CREATININE 0.62 0.95 0.86 0.90  CALCIUM 8.4* 7.7* 7.1* 8.1*  MG  --  1.3* 1.8  --    Liver Function Tests: No results for input(s): AST, ALT, ALKPHOS, BILITOT, PROT, ALBUMIN in the last 168 hours.   Recent Results (from the past 240 hour(s))  Aerobic/Anaerobic  Culture (surgical/deep wound)     Status: None (Preliminary result)   Collection Time: 12/09/17 11:24 PM  Result Value Ref Range Status   Specimen Description   Final    ABSCESS Performed at [facility], [address] [place], [place] [address]    Special Requests   Final    PROSTATE TISSUE Performed at [facility], [address] [place], [place] [address]    Gram Stain   Final    MODERATE WBC PRESENT, PREDOMINANTLY MONONUCLEAR FEW GRAM POSITIVE COCCI IN PAIRS    Culture   Final    NO GROWTH 2 DAYS Performed at [facility], [address], [place], [place] [address]    Report Status PENDING  Incomplete  Acid Fast Smear (AFB)     Status: None     Collection Time: 12/09/17 11:24 PM  Result Value Ref Range Status   AFB Specimen Processing Concentration  Final   Acid Fast Smear Negative  Final    Comment: (NOTE) Performed At: [facility] [address] [place], [place] [id number] Jolene Schimke MD OZ:[id number]    Source (AFB) TISSUE  Final    Comment: Performed at [facility], [address], [place], [place] [address]  Aerobic/Anaerobic Culture (surgical/deep wound)     Status: None (Preliminary result)   Collection Time: 12/09/17 11:28 PM  Result Value Ref Range Status   Specimen Description   Final    ABSCESS Performed at [facility], [address], [place], [place] [address]    Special Requests   Final    PROSTATE ABSCESS DRAIN Performed at [facility], [address], [place], [place] [address]    Gram Stain   Final    MODERATE WBC PRESENT, PREDOMINANTLY PMN MODERATE GRAM POSITIVE COCCI    Culture   Final    NO GROWTH 1 DAY Performed at [facility], [address], [place], [place] [address]    Report Status PENDING  Incomplete  MRSA PCR Screening     Status: None   Collection Time: 12/10/17  1:06 AM  Result Value Ref Range Status   MRSA by PCR NEGATIVE NEGATIVE Final    Comment:        The GeneXpert MRSA Assay (FDA approved for NASAL specimens only), is one component of a comprehensive MRSA colonization surveillance program. It is not intended to diagnose MRSA infection nor to guide or monitor treatment for MRSA infections. Performed at [facility], [address], [place], [place] [address]   Culture, Urine     Status: None   Collection Time: 12/10/17  1:40 AM  Result Value Ref Range Status   Specimen Description   Final    URINE, CLEAN CATCH Performed at [facility], [address], [place], [place] [address]    Special Requests   Final    NONE Performed at [facility], [address] [place], [place] [address]    Culture   Final    NO GROWTH Performed at [facility], [address] [place], [place] [address]    Report Status 12/11/2017 FINAL  Final  Culture, blood (Routine X 2) w Reflex to ID Panel     Status: None (Preliminary result)   Collection Time: 12/10/17  8:36 AM  Result Value Ref Range Status   Specimen Description   Final    BLOOD RIGHT ANTECUBITAL Performed  at [facility], [address] [place], [place] [address]    Special Requests   Final    BOTTLES DRAWN AEROBIC ONLY Blood Culture results may not be optimal due to an inadequate volume of blood received in culture bottles Performed at [facility], [address], [place], [place] [address]    Culture   Final    NO GROWTH < 24 HOURS Performed at [facility], [address], [place], [place] [address]    Report Status PENDING  Incomplete  Culture, blood (Routine X 2) w Reflex to ID Panel     Status: None (Preliminary result)   Collection Time: 12/10/17  8:37 AM  Result Value Ref Range Status   Specimen Description   Final    BLOOD BLOOD LEFT HAND Performed at [facility], [address] [place], [place] [address]    Special Requests   Final    BOTTLES DRAWN AEROBIC ONLY Blood Culture adequate volume Performed at [facility], [address], [place], [place] [address]    Culture   Final    NO GROWTH < 24 HOURS Performed at [facility], [address], [place], [place] [address]    Report Status PENDING  Incomplete         Radiology Studies: No results found.      Scheduled Meds: . bictegravir-emtricitabine-tenofovir AF  1 tablet Oral Daily  . famotidine  20 mg Oral BID  . hydrocortisone sod succinate (SOLU-CORTEF) inj  50 mg Intravenous Q12H   Continuous Infusions: . fluconazole (DIFLUCAN) IV Stopped (12/11/17 2032)  . vancomycin 1,000 mg  (12/12/17 1048)     LOS: 3 days     Marcellus Scott, MD, FACP, [facility]. [facility] Pager [phone]  If 7PM-7AM, please contact night-coverage [email or web address] Password [facility] 12/12/2017, 11:58 AM

## 2017-12-12 NOTE — Plan of Care (Signed)
°  Problem: Coping: °Goal: Level of anxiety will decrease °Outcome: Progressing °  °

## 2017-12-12 NOTE — Progress Notes (Signed)
Patient's foley catheter DC'd at 1030am. Patient had 50mL urin output at 11am with a small blood clot. Urology and hospitalist made aware. Per urology, this is expected. Will continue to monitor.

## 2017-12-13 ENCOUNTER — Other Ambulatory Visit: Payer: Self-pay | Admitting: Pharmacist Clinician (PhC)/ Clinical Pharmacy Specialist

## 2017-12-13 ENCOUNTER — Inpatient Hospital Stay (HOSPITAL_COMMUNITY): Payer: Medicaid Other

## 2017-12-13 DIAGNOSIS — R29898 Other symptoms and signs involving the musculoskeletal system: Secondary | ICD-10-CM

## 2017-12-13 DIAGNOSIS — R531 Weakness: Secondary | ICD-10-CM

## 2017-12-13 DIAGNOSIS — K209 Esophagitis, unspecified without bleeding: Secondary | ICD-10-CM

## 2017-12-13 LAB — CBC WITH DIFFERENTIAL/PLATELET
BASOS ABS: 0 10*3/uL (ref 0.0–0.1)
BASOS PCT: 0 %
EOS ABS: 0.3 10*3/uL (ref 0.0–0.7)
Eosinophils Relative: 16 %
HCT: 29.1 % — ABNORMAL LOW (ref 39.0–52.0)
HEMOGLOBIN: 9.7 g/dL — AB (ref 13.0–17.0)
Lymphocytes Relative: 19 %
Lymphs Abs: 0.3 10*3/uL — ABNORMAL LOW (ref 0.7–4.0)
MCH: 30.6 pg (ref 26.0–34.0)
MCHC: 33.3 g/dL (ref 30.0–36.0)
MCV: 91.8 fL (ref 78.0–100.0)
Monocytes Absolute: 0.5 10*3/uL (ref 0.1–1.0)
Monocytes Relative: 26 %
NEUTROS ABS: 0.7 10*3/uL — AB (ref 1.7–7.7)
Neutrophils Relative %: 39 %
PLATELETS: 143 10*3/uL — AB (ref 150–400)
RBC: 3.17 MIL/uL — ABNORMAL LOW (ref 4.22–5.81)
RDW: 14.3 % (ref 11.5–15.5)
WBC: 1.8 10*3/uL — ABNORMAL LOW (ref 4.0–10.5)

## 2017-12-13 LAB — CRYPTOCOCCAL ANTIGEN: CRYPTO AG: NEGATIVE

## 2017-12-13 LAB — BASIC METABOLIC PANEL
ANION GAP: 9 (ref 5–15)
BUN: 19 mg/dL (ref 6–20)
CHLORIDE: 106 mmol/L (ref 101–111)
CO2: 22 mmol/L (ref 22–32)
Calcium: 8.1 mg/dL — ABNORMAL LOW (ref 8.9–10.3)
Creatinine, Ser: 0.83 mg/dL (ref 0.61–1.24)
GFR calc Af Amer: >60 mL/min (ref >60)
Glucose, Bld: 104 mg/dL — ABNORMAL HIGH (ref 65–99)
POTASSIUM: 2.9 mmol/L — AB (ref 3.5–5.1)
SODIUM: 137 mmol/L (ref 135–145)

## 2017-12-13 MED ORDER — METRONIDAZOLE 500 MG PO TABS
500.0000 mg | ORAL_TABLET | Freq: Three times a day (TID) | ORAL | Status: DC
  Administered 2017-12-13 – 2017-12-14 (×5): 500 mg via ORAL
  Filled 2017-12-13 (×5): qty 1

## 2017-12-13 MED ORDER — FLUCONAZOLE 200 MG PO TABS
200.0000 mg | ORAL_TABLET | Freq: Every day | ORAL | Status: DC
  Administered 2017-12-13 – 2017-12-14 (×2): 200 mg via ORAL
  Filled 2017-12-13 (×2): qty 1

## 2017-12-13 MED ORDER — BICTEGRAVIR-EMTRICITAB-TENOFOV 50-200-25 MG PO TABS: 1.0000 | ORAL_TABLET | Freq: Every day | ORAL | 0 refills | Status: DC

## 2017-12-13 MED ORDER — GADOBENATE DIMEGLUMINE 529 MG/ML IV SOLN
15.0000 mL | Freq: Once | INTRAVENOUS | Status: AC | PRN
  Administered 2017-12-13: 15 mL via INTRAVENOUS

## 2017-12-13 MED ORDER — POTASSIUM CHLORIDE CRYS ER 20 MEQ PO TBCR
40.0000 meq | EXTENDED_RELEASE_TABLET | Freq: Two times a day (BID) | ORAL | Status: DC
  Administered 2017-12-13 – 2017-12-14 (×4): 40 meq via ORAL
  Filled 2017-12-13 (×5): qty 4

## 2017-12-13 MED FILL — BIKTARVY 50-200-25 MG TABS: 50-200-25 | 30 days supply | Qty: 30 | Fill #0

## 2017-12-13 NOTE — Progress Notes (Signed)
No reason given for LCSW consult.   It is noted that patient has medicaid needs. Patient will need to be seen by financial counseling to assist with those needs.   LCSW signing off. No CSW needs. Please submit new consult if CSW needs arise.  Beulah Gandy [place] CSW [phone]

## 2017-12-13 NOTE — Progress Notes (Signed)
PHARMACIST - PHYSICIAN COMMUNICATION DR:   TRH CONCERNING: Antibiotic IV to Oral Route Change Policy  RECOMMENDATION: This patient is receiving fluconazole by the intravenous route.  Based on criteria approved by the Pharmacy and Therapeutics Committee, the antibiotic(s) is/are being converted to the equivalent oral dose form(s).   DESCRIPTION: These criteria include:  Patient being treated for a respiratory tract infection, urinary tract infection, cellulitis or clostridium difficile associated diarrhea if on metronidazole  The patient is not neutropenic and does not exhibit a GI malabsorption state  The patient is eating (either orally or via tube) and/or has been taking other orally administered medications for a least 24 hours  The patient is improving clinically and has a Tmax < 100.5  If you have questions about this conversion, please contact the Pharmacy Department    [phone] )  [facility]   [phone] )  [facility]   [phone] )  [facility]   [phone] )  [facility]   [phone] )  [facility]   Juliette Alcide, PharmD, BCPS.   12/13/2017 12:39 PM

## 2017-12-13 NOTE — Progress Notes (Signed)
PROGRESS NOTE   Jacob Gutierrez  ZOX:[id number]    DOB: 10-31-1985    DOA: 12/09/2017  PCP: Jacob Sessions, NP   I have briefly reviewed patients previous medical records in [facility]  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   Initially coffee-ground and then dry heaving only, difficulty breathing, nonproductive cough, fever up to 102 F, abdominal pain admitted for sepsis due to prostatic abscess.   Urology performed TURP 5/18.  ID consulted.  CCM consulted for hypotension and concern for impending shock, signed off 5/20.   Foley catheter discontinued 5/21.   Clinically improved.  Transferred to medical bed 5/21.    Assessment & Plan:   Principal Problem:   Sepsis (HCC) Active Problems:   AIDS (acquired immune deficiency syndrome) (HCC)   Personal history of MRSA (methicillin resistant Staphylococcus aureus)   Prostatitis, acute   History of ESBL E. coli infection   Oral candida   Hemoptysis   Prostate abscess   Protein-calorie malnutrition, severe   Severe sepsis: Likely due to acute prostatitis with prostatic abscess.  Status post TURP by urology on 5/18.  Empirically started on IV vancomycin and meropenem.  Abscess Gram stain shows gram-positive cocci in pairs, culture negative to date.  MRSA PCR negative.  No blood cultures were drawn on admission, drawn 5/19 are negative to date.  Urine culture negative.  Abscess AFB smear negative.  AFB & fungal culture pending. On morning of 5/19, hypotensive in the  mid 70s, tachycardic, febrile up to 103 F.  Treated aggressively with IV fluids, almost 6 L crystalloids, lactate 1.6 > 2.4, stress dose steroids started, did not require pressors.  Sepsis features resolved.  CCM consulted and signed off.  ID follow-up appreciated.  Discussed with Dr. Daiva Eves >suspects enterococcal infection, discontinued meropenem, continue vancomycin but still not clear on antibiotic choice at discharge (allergic to amoxicillin, thrombocytopenic hence Zyvox may not be an option, could consider oritavancin but not FDA approved for prostatic abscess).  Weaning stress dose steroids. Because of weakness in lower extremities which is subjective (objective exam not significant for weakness) we will obtain MRI of the brain as well as MRI of the lower spine as per my discussion with Dr. Algis Liming   Prostate abscess: CT abdomen confirmed prostate abscess.  Urology consulted and patient underwent cystoscopy, TURP on 5/18.  Continue antibiotics as above.  Urology follow-up appreciated.  Discussed with Dr. Marlou Porch >Foley catheter discontinued 5/21, mild hematuria after removal as expected, outpatient follow-up with him in 6 weeks.   Appreciate ID input regarding antibiotic coverage-might be able to give long-acting variant of vancomycin to cover Will discontinue condom catheter  Oral and esophageal candidiasis: Continue fluconazole and Magic mouthwash.  Improving  Hypokalemia/hypomagnesemia: Replace and follow.  Pancytopenia: Anemia and leukopenia appear chronic.  Current worsening may be related to sepsis and HIV/AIDS.  No overt bleeding reported.  Follow CBCs daily.  Transfuse if hemoglobin <7 g per DL.  Fluctuating but stable.  Follow CBCs.  Nausea and vomiting:?  Coffee-ground emesis early on but presently only dry  heaving and no coffee grounds.  IV Pepcid added.  PRN antiemetics.  If continues to have vomiting or dry heaving, may have to downgrade diet.  Resolved.  HIV/AIDS: Noncompliant  with HAART.  ID started patient on Biktarvy.  Absolute CD4 count <10.  HIV 1 RNA PCR pending. Infectious disease will coordinate management going forward  Severe malnutrition/Body mass index is 22.43 kg/m.  Dietitian consulted.  Hemoptysis: Not sure if he had this.  None since admission.  No cavitary lesions on chest x-ray.  Monitor.  Resolved.  Asymptomatic sinus bradycardia: Noted on telemetry.  TSH 0.684.  Hyponatremia: Clinically euvolemic.?  SIADH related to HIV/AIDS.  TSH normal.  Stable.   DVT prophylaxis: SCDs. Code Status: Full Family Communication: None at bedside Disposition: Was admitted to stepdown unit.  Not ready for medical discharge as still awaiting lab work inclusive of cryptococcus, RPR, MRI brain and back   Consultants:  Urology Infectious disease PCCM-signed off 5/20  Procedures:  Cystoscopy, TURP and unroofing of prostatic abscess 5/18 Foley catheter-discontinued 5/21.  Antimicrobials:  IV vancomycin 5/18 > IV meropenem 5/18-5/21. IV fluconazole 5/18 >.   Subjective:  Claims to be weak and could not stand earlier today-he self reports a fall however nursing states this did not occur He also is not getting up out of bed and going to the restroom and requested that a condom cath be placed instead of bedside commode (Foley catheter was discontinued 5/21)  ROS: As above.  Objective:  Vitals:   12/12/17 1500 12/12/17 1640 12/12/17 2043 12/13/17 0611  BP:  128/80 125/86 127/84  Pulse: (!) 48 (!) 50 (!) 45 (!) 42  Resp: Temp:  98.2 F (36.8 C) 98.2 F (36.8 C) 97.6 F (36.4 C)  TempSrc:  Oral    SpO2: 100% 100% 99% 100%  Weight:      Height:        Examination:  General exam: Pleasant young male, moderately built, thinly nourished and chronically ill looking sitting up comfortably in bed.  Fungal rash to both sides of face oral mucosa moist.  Oral thrush, improving.   Respiratory system: Clear to auscultation. Respiratory effort  normal.  Stable Cardiovascular system: S1 & S2 heard, RRR. No JVD, murmurs, rubs, gallops or clicks. No pedal edema.   Gastrointestinal system: Abdomen is nondistended, soft and nontender. No organomegaly or masses felt. Normal bowel sounds heard.  Stable GU: Foley catheter in place with straw-colored urine.  No gross hematuria.  Stable Central nervous system: Alert and oriented. No focal neurological deficits.  Stable Extremities: Symmetric 5 x 5 power.  Stable Skin: Healing superficial approximately 2 x 1 cm ulcer over right anterior proximal thigh without any acute findings suggestive of wound infection, cellulitis or abscess.  Stable. Psychiatry: Judgement and insight appear normal. Mood & affect appropriate.     Data Reviewed: I have personally reviewed following labs and imaging studies  CBC: Recent Labs  Lab 12/09/17 1130 12/10/17 0340 12/11/17 0335 12/12/17 0522 12/13/17 0559  WBC 1.5* 1.5* 2.7* 1.8* 1.8*  NEUTROABS  --   --  2.3 1.4* 0.7*  HGB 10.8* 9.9* 9.1* 9.2* 9.7*  HCT 32.9* 29.7* 26.6* 26.9* 29.1*  MCV 92.4 90.8 92.0 91.2 91.8  PLT 207 128* 135* 128* 143*   Basic Metabolic Panel: Recent Labs  Lab 12/09/17 1130 12/10/17 0340 12/11/17 0335 12/12/17 0522 12/13/17 0559  NA 131* 131* 134* 133* 137  K 3.7 2.9* 4.0 3.2* 2.9*  CL 97*  100* 110 105 106  CO2 27 21* 18* 19* 22  GLUCOSE 84 107* 147* 150* 104*  BUN CREATININE 0.62 0.95 0.86 0.90 0.83  CALCIUM 8.4* 7.7* 7.1* 8.1* 8.1*  MG  --  1.3* 1.8  --   --    Liver Function Tests: No results for input(s): AST, ALT, ALKPHOS, BILITOT, PROT, ALBUMIN in the last 168 hours.   Recent Results (from the past 240 hour(s))  Fungus Culture With Stain     Status: None (Preliminary result)   Collection Time: 12/09/17 11:24 PM  Result Value Ref Range Status   Fungus Stain Final report  Final    Comment: (NOTE) Performed At: [facility] [address] [place], [place] [id number] Jolene Schimke MD  QI:[id number]    Fungus (Mycology) Culture PENDING  Incomplete   Fungal Source TISSUE  Final    Comment: Performed at [facility], [address], [place], [place] [address]  Aerobic/Anaerobic Culture (surgical/deep wound)     Status: None (Preliminary result)   Collection Time: 12/09/17 11:24 PM  Result Value Ref Range Status   Specimen Description   Final    ABSCESS Performed at [facility], [address], [place], [place] [address]    Special Requests   Final    PROSTATE TISSUE Performed at [facility], [address] [place], [place] [address]    Gram Stain   Final    MODERATE WBC PRESENT, PREDOMINANTLY MONONUCLEAR FEW GRAM POSITIVE COCCI IN PAIRS    Culture   Final    HOLDING FOR POSSIBLE ANAEROBE Performed at [facility], [address], [place], [place] [address]    Report Status PENDING  Incomplete  Acid Fast Smear (AFB)     Status: None   Collection Time: 12/09/17 11:24 PM  Result Value Ref Range Status   AFB Specimen Processing Concentration  Final   Acid Fast Smear Negative  Final    Comment: (NOTE) Performed At: [facility] [address] [place], [place] [id number] Jolene Schimke MD FI:[id number]    Source (AFB) TISSUE  Final    Comment: Performed at [facility], [address] [place], [place] [address]  Fungus Culture Result     Status: None   Collection Time: 12/09/17 11:24 PM  Result Value Ref Range Status   Result 1 Comment  Final    Comment: (NOTE) KOH/Calcofluor preparation:  no fungus observed. Performed At: [facility] [address] [place], [place] [id number] Jolene Schimke MD AT:[id number] Performed at [facility], [address] [place], [place] [address]   Aerobic/Anaerobic Culture (surgical/deep wound)     Status: None (Preliminary result)   Collection Time: 12/09/17 11:28 PM  Result Value Ref Range Status    Specimen Description   Final    ABSCESS Performed at [facility], [address] [place], [place] [address]    Special Requests   Final    PROSTATE ABSCESS DRAIN Performed at [facility], [address] [place], [place] [address]    Gram Stain   Final    MODERATE WBC PRESENT, PREDOMINANTLY PMN MODERATE GRAM POSITIVE COCCI    Culture   Final    HOLDING FOR POSSIBLE ANAEROBE Performed at [facility], [address] [place], [place] [address]    Report Status PENDING  Incomplete  MRSA PCR Screening     Status: None  Collection Time: 12/10/17  1:06 AM  Result Value Ref Range Status   MRSA by PCR NEGATIVE NEGATIVE Final    Comment:        The GeneXpert MRSA Assay (FDA approved for NASAL specimens only), is one component of a comprehensive MRSA colonization surveillance program. It is not intended to diagnose MRSA infection nor to guide or monitor treatment for MRSA infections. Performed at [facility], [address] [place], [place] [address]   Culture, Urine     Status: None   Collection Time: 12/10/17  1:40 AM  Result Value Ref Range Status   Specimen Description   Final    URINE, CLEAN CATCH Performed at [facility], [address] [place], [place] [address]    Special Requests   Final    NONE Performed at [facility], [address], [place], [place] [address]    Culture   Final    NO GROWTH Performed at [facility], [address] [place], [place] [address]    Report Status 12/11/2017 FINAL  Final  Culture, blood (Routine X 2) w Reflex to ID Panel     Status: None (Preliminary result)   Collection Time: 12/10/17  8:36 AM  Result Value Ref Range Status   Specimen Description   Final    BLOOD RIGHT ANTECUBITAL Performed at [facility], [address], [place], [place] [address]    Special Requests   Final    BOTTLES DRAWN  AEROBIC ONLY Blood Culture results may not be optimal due to an inadequate volume of blood received in culture bottles Performed at [facility], [address], [place], [place] [address]    Culture   Final    NO GROWTH 2 DAYS Performed at [facility], [address] [place], [place] [address]    Report Status PENDING  Incomplete  Culture, blood (Routine X 2) w Reflex to ID Panel     Status: None (Preliminary result)   Collection Time: 12/10/17  8:37 AM  Result Value Ref Range Status   Specimen Description   Final    BLOOD BLOOD LEFT HAND Performed at [facility], [address], [place], [place] [address]    Special Requests   Final    BOTTLES DRAWN AEROBIC ONLY Blood Culture adequate volume Performed at [facility], [address] [place], [place] [address]    Culture   Final    NO GROWTH 2 DAYS Performed at [facility], [address] [place], [place] [address]    Report Status PENDING  Incomplete         Radiology Studies: No results found.      Scheduled Meds: . bictegravir-emtricitabine-tenofovir AF  1 tablet Oral Daily  . famotidine  20 mg Oral BID  . feeding supplement (ENSURE ENLIVE)  237 mL Oral TID BM  . fluconazole  200 mg Oral QHS  . hydrocortisone sod succinate (SOLU-CORTEF) inj  50 mg Intravenous Q12H  . metroNIDAZOLE  500 mg Oral Q8H  . multivitamin with minerals  1 tablet Oral Daily  . potassium chloride SA  40 mEq Oral BID   Continuous Infusions: . vancomycin Stopped (12/13/17 1056)     LOS: 4 days     Pleas Koch, MD [facility]  If 7PM-7AM, please contact night-coverage [email or web address] Password [email or web address] 12/13/2017, 1:10 PM

## 2017-12-13 NOTE — Progress Notes (Signed)
Subjective:  He is complaining of significant leg weakness that is new for him.  Antibiotics:  Anti-infectives (From admission, onward)   Start     Dose/Rate Route Frequency Ordered Stop   12/13/17 2200  fluconazole (DIFLUCAN) tablet 200 mg     200 mg Oral Daily at bedtime 12/13/17 1237     12/13/17 1400  metroNIDAZOLE (FLAGYL) tablet 500 mg     500 mg Oral Every 8 hours 12/13/17 1229     12/12/17 2200  vancomycin (VANCOCIN) 1,250 mg in sodium chloride 0.9 % 250 mL IVPB     1,250 mg 166.7 mL/hr over 90 Minutes Intravenous Every 12 hours 12/12/17 1200     12/10/17 1600  bictegravir-emtricitabine-tenofovir AF (BIKTARVY) 50-200-25 MG per tablet 1 tablet     1 tablet Oral Daily 12/10/17 1505     12/10/17 0400  meropenem (MERREM) 1 g in sodium chloride 0.9 % 100 mL IVPB  Status:  Discontinued     1 g 200 mL/hr over 30 Minutes Intravenous Every 8 hours 12/10/17 0208 12/12/17 0916   12/10/17 0300  vancomycin (VANCOCIN) IVPB 1000 mg/200 mL premix  Status:  Discontinued     1,000 mg 200 mL/hr over 60 Minutes Intravenous Every 8 hours 12/09/17 1912 12/12/17 1200   12/09/17 2200  gentamicin (GARAMYCIN) 390 mg in dextrose 5 % 100 mL IVPB  Status:  Discontinued     5 mg/kg  77.1 kg 219.5 mL/hr over 30 Minutes Intravenous Every 24 hours 12/09/17 2146 12/09/17 2339   12/09/17 2000  fluconazole (DIFLUCAN) IVPB 200 mg  Status:  Discontinued     200 mg 100 mL/hr over 60 Minutes Intravenous Every 24 hours 12/09/17 1915 12/13/17 1237   12/09/17 1915  vancomycin (VANCOCIN) IVPB 1000 mg/200 mL premix     1,000 mg 200 mL/hr over 60 Minutes Intravenous  Once 12/09/17 1906 12/09/17 2057   12/09/17 1915  vancomycin (VANCOCIN) 500 mg in sodium chloride 0.9 % 100 mL IVPB     500 mg 100 mL/hr over 60 Minutes Intravenous  Once 12/09/17 1912 12/09/17 2057      Medications: Scheduled Meds: . bictegravir-emtricitabine-tenofovir AF  1 tablet Oral Daily  . famotidine  20 mg Oral BID  . feeding  supplement (ENSURE ENLIVE)  237 mL Oral TID BM  . fluconazole  200 mg Oral QHS  . hydrocortisone sod succinate (SOLU-CORTEF) inj  50 mg Intravenous Q12H  . metroNIDAZOLE  500 mg Oral Q8H  . multivitamin with minerals  1 tablet Oral Daily  . potassium chloride SA  40 mEq Oral BID   Continuous Infusions: . vancomycin Stopped (12/13/17 1056)   PRN Meds:.acetaminophen **OR** acetaminophen, magic mouthwash w/lidocaine, ondansetron **OR** ondansetron (ZOFRAN) IV, oxybutynin    Objective: Weight change:   Intake/Output Summary (Last 24 hours) at 12/13/2017 1645 Last data filed at 12/13/2017 1611 Gross per 24 hour  Intake 1320 ml  Output 2650 ml  Net -1330 ml   Blood pressure 127/84, pulse (!) 42, temperature 97.6 F (36.4 C), resp. rate 16, height  (1.854 m), weight 170 lb (77.1 kg), SpO2 100 %. Temp:  [97.6 F (36.4 C)-98.2 F (36.8 C)] 97.6 F (36.4 C) (05/22 0611) Pulse Rate:  [42-45] 42 (05/22 0611) Resp:  [16-18] 16 (05/22 0611) BP: (125-127)/(84-86) 127/84 (05/22 0611) SpO2:  [99 %-100 %] 100 % (05/22 4098)  Physical Exam: General: Alert and awake, oriented x3, not in any acute distress. Pulmonary no wheezes or  respiratory distress  Cardiovascular exam regular rate and rhythm  Abdomen soft nondistended Neuro: He has strength 4 out of 5 in both lower extremities  CBC: CBC Latest Ref Rng & Units 12/13/2017 12/12/2017 12/11/2017  WBC 4.0 - 10.5 K/uL 1.8(L) 1.8(L) 2.7(L)  Hemoglobin 13.0 - 17.0 g/dL 9.6(Q) 2.2(L) 7.9(G)  Hematocrit 39.0 - 52.0 % 29.1(L) 26.9(L) 26.6(L)  Platelets 150 - 400 K/uL 143(L) 128(L) 135(L)      BMET Recent Labs    12/12/17 0522 12/13/17 0559  NA 133* 137  K 3.2* 2.9*  CL 105 106  CO2 19* 22  GLUCOSE 150* 104*  BUN 15 19  CREATININE 0.90 0.83  CALCIUM 8.1* 8.1*     Liver Panel  No results for input(s): PROT, ALBUMIN, AST, ALT, ALKPHOS, BILITOT, BILIDIR, IBILI in the last 72 hours.     Sedimentation Rate No results for  input(s): ESRSEDRATE in the last 72 hours. C-Reactive Protein No results for input(s): CRP in the last 72 hours.  Micro Results: Recent Results (from the past 720 hour(s))  Fungus Culture With Stain     Status: None (Preliminary result)   Collection Time: 12/09/17 11:24 PM  Result Value Ref Range Status   Fungus Stain Final report  Final    Comment: (NOTE) Performed At: [facility] [address] [place], [place] [id number] Jolene Schimke MD YC:[id number]    Fungus (Mycology) Culture PENDING  Incomplete   Fungal Source TISSUE  Final    Comment: Performed at [facility], [address] [place], [place] [address]  Aerobic/Anaerobic Culture (surgical/deep wound)     Status: None (Preliminary result)   Collection Time: 12/09/17 11:24 PM  Result Value Ref Range Status   Specimen Description   Final    ABSCESS Performed at [facility], [address], [place], [place] [address]    Special Requests   Final    PROSTATE TISSUE Performed at [facility], [address] [place], [place] [address]    Gram Stain   Final    MODERATE WBC PRESENT, PREDOMINANTLY MONONUCLEAR FEW GRAM POSITIVE COCCI IN PAIRS    Culture   Final    HOLDING FOR POSSIBLE ANAEROBE Performed at [facility], [address] [place], [place] [address]    Report Status PENDING  Incomplete  Acid Fast Smear (AFB)     Status: None   Collection Time: 12/09/17 11:24 PM  Result Value Ref Range Status   AFB Specimen Processing Concentration  Final   Acid Fast Smear Negative  Final    Comment: (NOTE) Performed At: [facility] [address] [place], [place] [id number] Jolene Schimke MD NO:[id number]    Source (AFB) TISSUE  Final    Comment: Performed at [facility], [address] [place], [place] [address]  Fungus Culture Result     Status: None   Collection Time: 12/09/17 11:24 PM  Result Value Ref Range  Status   Result 1 Comment  Final    Comment: (NOTE) KOH/Calcofluor preparation:  no fungus observed. Performed At: [facility] [address] [place], [place] [id number] Jolene Schimke MD KC:[id number] Performed at [facility], [address] [place], [place] [address]   Aerobic/Anaerobic Culture (surgical/deep wound)     Status: None (Preliminary result)   Collection Time: 12/09/17 11:28 PM  Result Value Ref Range Status   Specimen Description   Final    ABSCESS Performed at [facility], [address] [place], [place] [address]    Special Requests   Final    PROSTATE ABSCESS DRAIN Performed at [facility], [address] [place], [place] [address]    Gram Stain   Final    MODERATE WBC PRESENT, PREDOMINANTLY PMN MODERATE GRAM POSITIVE COCCI    Culture   Final    HOLDING FOR POSSIBLE ANAEROBE Performed at [facility], [address] [place], [place] [address]    Report Status PENDING  Incomplete  MRSA PCR Screening     Status: None   Collection Time: 12/10/17  1:06 AM  Result Value Ref Range Status   MRSA by PCR NEGATIVE NEGATIVE Final    Comment:        The GeneXpert MRSA Assay (FDA approved for NASAL specimens only), is one component of a comprehensive MRSA colonization surveillance program. It is not intended to diagnose MRSA infection nor to guide or monitor treatment for MRSA infections. Performed at [facility], [address], [place], [place] [address]   Culture, Urine     Status: None   Collection Time: 12/10/17  1:40 AM  Result Value Ref Range Status   Specimen Description   Final    URINE, CLEAN CATCH Performed at [facility], [address] [place], [place] [address]    Special Requests   Final    NONE Performed at [facility], [address], [place], [place] [address]    Culture   Final    NO  GROWTH Performed at [facility], [address] [place], [place] [address]    Report Status 12/11/2017 FINAL  Final  Culture, blood (Routine X 2) w Reflex to ID Panel     Status: None (Preliminary result)   Collection Time: 12/10/17  8:36 AM  Result Value Ref Range Status   Specimen Description   Final    BLOOD RIGHT ANTECUBITAL Performed at [facility], [address], [place], [place] [address]    Special Requests   Final    BOTTLES DRAWN AEROBIC ONLY Blood Culture results may not be optimal due to an inadequate volume of blood received in culture bottles Performed at [facility], [address] [place], [place] [address]    Culture   Final    NO GROWTH 3 DAYS Performed at [facility], [address], [place], [place] [address]    Report Status PENDING  Incomplete  Culture, blood (Routine X 2) w Reflex to ID Panel     Status: None (Preliminary result)   Collection Time: 12/10/17  8:37 AM  Result Value Ref Range Status   Specimen Description   Final    BLOOD BLOOD LEFT HAND Performed at [facility], [address] [place], [place] [address]    Special Requests   Final    BOTTLES DRAWN AEROBIC ONLY Blood Culture adequate volume Performed at [facility], [address], [place], [place] [address]    Culture   Final    NO GROWTH 3 DAYS Performed at [facility], [address], [place], [place] [address]    Report Status PENDING  Incomplete    Studies/Results: No results found.    Assessment/Plan:  INTERVAL HISTORY: She has noticed worsening weakness in his lower extremities  Principal Problem:   Sepsis (HCC) Active Problems:   AIDS (acquired immune deficiency syndrome) (HCC)  Personal history of MRSA (methicillin resistant Staphylococcus aureus)   Prostatitis, acute   History of ESBL E. coli infection   Oral candida   Hemoptysis   Prostate abscess    Protein-calorie malnutrition, severe    Jacob Gutierrez is a 32 y.o. male with HIV and AIDS is not been compliant with his antiretroviral regimen admitted now with prostate abscess status post transurethral resection of the prostate with cultures having been sent with Gram stain showing moderate gram-positive cocci in pairs but cultures so far not yielding an organism.  We have narrowed him to vancomycin though I added metronidazole today to cover for anaerobes  1.  Prostate abscess: likely an enterococcal infection Continue vancomycin IV and oral metronidazole while in the hospital and then plan on giving him a dose of DALBAVANCIN at DC  2. HIV/AIDS: On BIKTARVY, PCP prevention  Is now talking about wanting to stay in [place] with family.  If this is the case I can ask 1 of my Bridge counselor to meet with him to get him enrolled into the atraumatic program though he would need to pay stubs.  We have procured a 30-day supply of Biktarvy for him  3 lower extremity weakness we are getting an MRI of the brain and this is now been done that does not show any acute findings we also get MRI of the lumbar spine.  Crypt coccal antigen is negative and serum and RPR is pending    LOS: 4 days   Acey Lav 12/13/2017, 4:45 PM

## 2017-12-14 ENCOUNTER — Other Ambulatory Visit: Payer: Self-pay | Admitting: Pharmacist Clinician (PhC)/ Clinical Pharmacy Specialist

## 2017-12-14 LAB — AEROBIC/ANAEROBIC CULTURE (SURGICAL/DEEP WOUND)

## 2017-12-14 LAB — RPR, QUANT+TP ABS (REFLEX): TREPONEMA PALLIDUM AB: POSITIVE — AB

## 2017-12-14 LAB — AEROBIC/ANAEROBIC CULTURE W GRAM STAIN (SURGICAL/DEEP WOUND)

## 2017-12-14 LAB — RPR: RPR Ser Ql: REACTIVE — AB

## 2017-12-14 LAB — CREATININE, SERUM: Creatinine, Ser: 0.78 mg/dL (ref 0.61–1.24)

## 2017-12-14 MED ORDER — SULFAMETHOXAZOLE-TRIMETHOPRIM 800-160 MG PO TABS
1.0000 | ORAL_TABLET | Freq: Every day | ORAL | Status: DC
  Administered 2017-12-14: 1 via ORAL
  Filled 2017-12-14 (×2): qty 1

## 2017-12-14 MED ORDER — SULFAMETHOXAZOLE-TRIMETHOPRIM 400-80 MG PO TABS: 1.0000 | ORAL_TABLET | Freq: Every day | ORAL | 5 refills | Status: DC

## 2017-12-14 MED ORDER — DEXTROSE 5 % IV SOLN
1500.0000 mg | Freq: Once | INTRAVENOUS | Status: AC
  Administered 2017-12-14: 1500 mg via INTRAVENOUS
  Filled 2017-12-14: qty 75

## 2017-12-14 MED ORDER — BICTEGRAVIR-EMTRICITAB-TENOFOV 50-200-25 MG PO TABS: 1.0000 | ORAL_TABLET | Freq: Every day | ORAL | 5 refills | Status: DC

## 2017-12-14 NOTE — Progress Notes (Signed)
Patient was supplied with the medication from pharmacy for 30 days for his HIV. Patient has been made aware to make sure he takes that medication as directed and make sure he takes it home with him. It is at bedside along with his other belongings and the cane that was delivered by [facility].

## 2017-12-14 NOTE — Progress Notes (Signed)
PROGRESS NOTE   Jacob Gutierrez  ZOX:[id number]    DOB: 15-Sep-1985    DOA: 12/09/2017  PCP: Grayce Sessions, NP   I have briefly reviewed patients previous medical records in [facility].  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   Initially coffee-ground and then dry heaving only, difficulty breathing, nonproductive cough, fever up to 102 F, abdominal pain admitted for sepsis due to prostatic abscess.   Urology performed TURP 5/18.  ID consulted.  CCM consulted for hypotension and concern for impending shock, signed off 5/20.   Foley catheter discontinued 5/21.   Clinically improved.  Transferred to medical bed 5/21.    Assessment & Plan:   Principal Problem:   Sepsis (HCC) Active Problems:   AIDS (acquired immune deficiency syndrome) (HCC)   Personal history of MRSA (methicillin resistant Staphylococcus aureus)   Prostatitis, acute   History of ESBL E. coli infection   Oral candida   Hemoptysis   Prostate abscess   Protein-calorie malnutrition, severe   Esophagitis   Weakness of both lower extremities   Severe sepsis: Likely due to acute prostatitis with prostatic abscess.  Status post TURP by urology on 5/18.  Empirically started on IV vancomycin and meropenem.  Abscess Gram stain shows gram-positive cocci in pairs, culture negative to date.  MRSA PCR negative.  No blood cultures were drawn on admission, drawn 5/19 are negative to date.  Urine culture negative.  Abscess AFB smear negative.  AFB & fungal  culture pending. On morning of 5/19, hypotensive in the mid 70s, tachycardic, febrile up to 103 F.  Treated aggressively with IV fluids, almost 6 L crystalloids, lactate 1.6 > 2.4, stress dose steroids started, did not require pressors.  Sepsis features resolved.  CCM consulted and signed off.  ID follow-up appreciated.  Patient is growing out Prevotella BIvia--need 6 weeks of Flagyl Weaning stress dose steroids.  MRI of the brain as well as MRI of the lower spine 5/22 negative for occult infection  Prostate abscess: CT abdomen confirmed prostate abscess.  Urology consulted and patient underwent cystoscopy, TURP on 5/18.  Continue antibiotics as above.  Urology follow-up appreciated.  Discussed with Dr. Marlou Porch >Foley catheter discontinued 5/21, mild hematuria after removal as expected, outpatient follow-up with him in 6 weeks.   Appreciate ID input regarding antibiotic coverage-Flagyl X 6 weeks? Will discontinue condom catheter  Oral and esophageal candidiasis: Continue fluconazole and Magic mouthwash.  Improving--stop date fluconazole 5/30  Hypokalemia/hypomagnesemia: Replace and follow.  Pancytopenia: Anemia and leukopenia appear chronic.  Current worsening may be related to sepsis and HIV/AIDS.  No overt bleeding reported.  Follow CBCs daily.  Transfuse if hemoglobin <7 g per DL.  Fluctuating but stable.  Follow CBCs.  Nausea and vomiting:?  Coffee-ground emesis early on but presently only dry heaving and no coffee grounds.  IV Pepcid added.  PRN antiemetics.  If continues to have vomiting or dry heaving, may have to downgrade diet.  Resolved.  HIV/AIDS: Noncompliant with HAART.  ID started patient on Biktarvy.  Absolute CD4 count <10.  HIV 1  RNA PCR pending. Infectious disease recommends Bactrim prophylaxis, providing H map etc. etc.  Severe malnutrition/Body mass index is 22.43 kg/m.  Dietitian consulted.  Hemoptysis: Not sure if he had this.  None since admission.  No cavitary lesions  on chest x-ray.  Monitor.  Resolved.  Asymptomatic sinus bradycardia: Noted on telemetry.  TSH 0.684.  Hyponatremia: Clinically euvolemic.?  SIADH related to HIV/AIDS.  TSH normal.  Stable.   DVT prophylaxis: SCDs. Code Status: Full Family Communication: Discussion with mother at the bedside who understands clearly patient is debilitated but is not able to manage and take care of him at home as patient has previously been homeless-the patient had been living in [place] up until about a month ago and patient's mother actually paid the patient's aunt to go and get him from [place] and bring the patient to the hospital given strained social circumstances with whom he was living with Disposition: disposition is difficult-I agree with Dr. Lattie Haw assessment that if he is not in a managed environment he will be at exorbitantly high risk to be readmitted in the next week and does need some type of structured environment and he cannot go home--social workers to follow-up in the morning   Consultants:  Urology Infectious disease PCCM-signed off 5/20  Procedures:  Cystoscopy, TURP and unroofing of prostatic abscess 5/18 Foley catheter-discontinued 5/21.  Antimicrobials:  IV vancomycin 5/18 > IV meropenem 5/18-5/21. IV fluconazole 5/18 >.   Subjective:  Awake alert pleasant looks stronger Walked 15 steps but very weak as per therapy note Eating some Still complains of some diarrhea but none has been noted by nurse tech No fever No chills No chest pain  ROS: As above.  Objective:  Vitals:   12/13/17 0611 12/13/17 2306 12/14/17 0602 12/14/17 1451  BP: 127/84 (!) 126/92 (!) 136/91 130/89  Pulse: (!) 42 (!) 46 (!) 47 (!) 59  Resp: 16 16  18   Temp: 97.6 F (36.4 C) (!) 97.5 F (36.4 C) 98 F (36.7 C) 98.1 F (36.7 C)  TempSrc:  Oral  Oral  SpO2: 100% 100% 100% 100%  Weight:      Height:        Examination: No significant changes 5/23 General exam: Pleasant young male,  moderately built, thinly nourished and chronically ill looking sitting up comfortably in bed.  Fungal rash to both sides of face oral mucosa moist.  Oral thrush, improving.   Respiratory system: Clear to auscultation. Respiratory effort normal.  Stable Cardiovascular system: S1 & S2 heard, RRR. No JVD, murmurs, rubs, gallops or clicks. No pedal edema.   Gastrointestinal system: Abdomen is nondistended, soft and nontender. No organomegaly or masses felt. Normal bowel sounds heard.  Stable GU: Foley catheter in place with straw-colored urine.  No gross hematuria.  Stable Central nervous system: Alert and oriented. No focal neurological deficits.  Stable Extremities: Symmetric 5 x 5 power.  Stable Skin: Healing superficial approximately 2 x 1 cm ulcer over right anterior proximal thigh without any acute findings suggestive of wound infection, cellulitis or abscess.  Stable. Psychiatry: Judgement and insight appear normal. Mood & affect appropriate.     Data Reviewed: I have personally reviewed following labs and imaging studies  CBC: Recent Labs  Lab 12/09/17 1130 12/10/17 0340 12/11/17 0335 12/12/17 0522 12/13/17 0559  WBC 1.5* 1.5* 2.7* 1.8* 1.8*  NEUTROABS  --   --  2.3 1.4* 0.7*  HGB 10.8* 9.9* 9.1* 9.2* 9.7*  HCT 32.9* 29.7* 26.6* 26.9* 29.1*  MCV  92.4 90.8 92.0 91.2 91.8  PLT 207 128* 135* 128* 143*   Basic Metabolic Panel: Recent Labs  Lab 12/09/17 1130 12/10/17 0340 12/11/17 0335 12/12/17 0522 12/13/17 0559 12/14/17 0609  NA 131* 131* 134* 133* 137  --   K 3.7 2.9* 4.0 3.2* 2.9*  --   CL 97* 100* 110 105 106  --   CO2 27 21* 18* 19* 22  --   GLUCOSE 84 107* 147* 150* 104*  --   BUN --   CREATININE 0.62 0.95 0.86 0.90 0.83 0.78  CALCIUM 8.4* 7.7* 7.1* 8.1* 8.1*  --   MG  --  1.3* 1.8  --   --   --    Liver Function Tests: No results for input(s): AST, ALT, ALKPHOS, BILITOT, PROT, ALBUMIN in the last 168 hours.   Recent Results (from the past 240  hour(s))  Fungus Culture With Stain     Status: None   Collection Time: 12/09/17 11:24 PM  Result Value Ref Range Status   Fungus Stain Final report  Final   Fungus (Mycology) Culture Preliminary report  Final    Comment: (NOTE) Performed At: [facility] [address] [place], [place] [id number] Jolene Schimke MD NW:[id number]    Fungal Source TISSUE  Final    Comment: Performed at [facility], [address] [place], [place] [address]  Aerobic/Anaerobic Culture (surgical/deep wound)     Status: None   Collection Time: 12/09/17 11:24 PM  Result Value Ref Range Status   Specimen Description   Final    ABSCESS Performed at [facility], [address], [place], [place] [address]    Special Requests   Final    PROSTATE TISSUE Performed at [facility], [address] [place], [place] [address]    Gram Stain   Final    MODERATE WBC PRESENT, PREDOMINANTLY MONONUCLEAR FEW GRAM POSITIVE COCCI IN PAIRS    Culture   Final    FEW PREVOTELLA BIVIA BETA LACTAMASE POSITIVE Performed at [facility], [address] [place], [place] [address]    Report Status 12/14/2017 FINAL  Final  Acid Fast Smear (AFB)     Status: None   Collection Time: 12/09/17 11:24 PM  Result Value Ref Range Status   AFB Specimen Processing Concentration  Final   Acid Fast Smear Negative  Final    Comment: (NOTE) Performed At: [facility] [address] [place], [place] [id number] Jolene Schimke MD QI:[id number]    Source (AFB) TISSUE  Final    Comment: Performed at [facility], [address], [place], [place] [address]  Fungus Culture Result     Status: None   Collection Time: 12/09/17 11:24 PM  Result Value Ref Range Status   Result 1 Comment  Final    Comment: (NOTE) KOH/Calcofluor preparation:  no fungus observed. Performed At: [facility] [address] [place], [place]  [id number] Jolene Schimke MD ZY:[id number] Performed at [facility], [address], [place], [place] [address]   Fungal organism reflex     Status: None   Collection Time: 12/09/17 11:24 PM  Result Value Ref Range Status   Fungal result 1 Comment  Final    Comment: (NOTE) Yeast isolated, identification in progress. Moderate growth CALLED AND FAXED TO NATHAN ON 12/14/17 :00PM,DPL Performed At: [facility] [address] [place], [place] [id number] Jolene Schimke MD YH:[id number] Performed  at [facility], [address], [place], [place] [address]   Aerobic/Anaerobic Culture (surgical/deep wound)     Status: None   Collection Time: 12/09/17 11:28 PM  Result Value Ref Range Status   Specimen Description   Final    ABSCESS Performed at [facility], [address] [place], [place] [address]    Special Requests   Final    PROSTATE ABSCESS DRAIN Performed at [facility], [address] [place], [place] [address]    Gram Stain   Final    MODERATE WBC PRESENT, PREDOMINANTLY PMN MODERATE GRAM POSITIVE COCCI    Culture   Final    MODERATE PREVOTELLA BIVIA BETA LACTAMASE POSITIVE Performed at [facility], [address], [place], [place] [address]    Report Status 12/14/2017 FINAL  Final  MRSA PCR Screening     Status: None   Collection Time: 12/10/17  1:06 AM  Result Value Ref Range Status   MRSA by PCR NEGATIVE NEGATIVE Final    Comment:        The GeneXpert MRSA Assay (FDA approved for NASAL specimens only), is one component of a comprehensive MRSA colonization surveillance program. It is not intended to diagnose MRSA infection nor to guide or monitor treatment for MRSA infections. Performed at [facility], [address] [place], [place] [address]   Culture, Urine     Status: None   Collection Time: 12/10/17  1:40 AM  Result Value Ref Range Status    Specimen Description   Final    URINE, CLEAN CATCH Performed at [facility], [address], [place], [place] [address]    Special Requests   Final    NONE Performed at [facility], [address] [place], [place] [address]    Culture   Final    NO GROWTH Performed at [facility], [address] [place], [place] [address]    Report Status 12/11/2017 FINAL  Final  Culture, blood (Routine X 2) w Reflex to ID Panel     Status: None (Preliminary result)   Collection Time: 12/10/17  8:36 AM  Result Value Ref Range Status   Specimen Description   Final    BLOOD RIGHT ANTECUBITAL Performed at [facility], [address], [place], [place] [address]    Special Requests   Final    BOTTLES DRAWN AEROBIC ONLY Blood Culture results may not be optimal due to an inadequate volume of blood received in culture bottles Performed at [facility], [address] [place], [place] [address]    Culture   Final    NO GROWTH 4 DAYS Performed at [facility], [address] [place], [place] [address]    Report Status PENDING  Incomplete  Culture, blood (Routine X 2) w Reflex to ID Panel     Status: None (Preliminary result)   Collection Time: 12/10/17  8:37 AM  Result Value Ref Range Status   Specimen Description   Final    BLOOD BLOOD LEFT HAND Performed at [facility], [address] [place], [place] [address]    Special Requests   Final    BOTTLES DRAWN AEROBIC ONLY Blood Culture adequate volume Performed at [facility], [address] [place], [place] [address]    Culture   Final    NO GROWTH 4 DAYS Performed at [facility], [address], [place], [place] [address]    Report Status PENDING  Incomplete         Radiology Studies: Mr Laqueta Jean MV Contrast  Result Date: 12/13/2017 CLINICAL DATA:  Initial evaluation for headache, lower  extremity weakness. Status post recent TURP with prostatic abscess. EXAM: MRI HEAD WITHOUT AND WITH CONTRAST TECHNIQUE: Multiplanar, multiecho pulse sequences of the brain and surrounding structures were obtained without and with intravenous contrast. CONTRAST:  15mL MULTIHANCE GADOBENATE DIMEGLUMINE 529 MG/ML IV SOLN COMPARISON:  Prior brain MRI from 03/06/2017. FINDINGS: Brain: Examination mildly limited by patient positioning and motion artifact. Mildly advanced cerebral atrophy for age. Encephalomalacia with gliosis within the parasagittal left parietal lobe consistent with remote ischemic infarct. No abnormal foci of restricted diffusion to suggest acute or subacute ischemia. Gray-white matter differentiation maintained. No other areas of chronic infarction. No acute or chronic intracranial hemorrhage. No mass lesion, midline shift or mass effect. No hydrocephalus. No extra-axial fluid collection. Major dural sinuses grossly patent. No abnormal enhancement. Pituitary gland suprasellar region normal. Midline structures intact and normal. Vascular: Major intracranial vascular flow voids maintained. Skull and upper cervical spine: Craniocervical junction within normal limits. Visualized upper cervical spine normal. Bone marrow signal intensity normal. No scalp soft tissue abnormality. Sinuses/Orbits: Globes and orbital soft tissues within normal limits. Paranasal sinuses are clear. No significant mastoid effusion. Other: None. IMPRESSION: 1. No acute intracranial abnormality. 2. Mildly advanced cerebral atrophy for age with remote left parietal lobe infarct. Electronically Signed   By: Rise Mu M.D.   On: 12/13/2017 21:45   Mr Lumbar Spine W Wo Contrast  Result Date: 12/13/2017 CLINICAL DATA:  Initial evaluation for lower extremity weakness. Recent TURP with prostatic abscess. History of HIV. EXAM: MRI LUMBAR SPINE WITHOUT AND WITH CONTRAST TECHNIQUE: Multiplanar and multiecho pulse sequences of  the lumbar spine were obtained without and with intravenous contrast. CONTRAST:  15mL MULTIHANCE GADOBENATE DIMEGLUMINE 529 MG/ML IV SOLN COMPARISON:  None. FINDINGS: Segmentation:  Study degraded by motion artifact. Normal segmentation. Lowest well-formed disc labeled the L5-S1 level. Alignment: Trace retrolisthesis of L5 on S1. Vertebral bodies otherwise normally aligned with preservation of the normal lumbar lordosis. Vertebrae: Vertebral body heights well maintained without evidence for acute or chronic fracture. Bone marrow signal intensity within normal limits. No discrete or worrisome osseous lesions. No abnormal marrow edema or enhancement. No findings to suggest discitis or other acute infection. Conus medullaris and cauda equina: Conus extends to the L1 level. Conus and cauda equina appear normal. No epidural collections. Paraspinal and other soft tissues: Paraspinous soft tissues within normal limits. Partially visualized visceral structures unremarkable. Disc levels: No significant findings are seen through the L4-5 level. L5-S1: Trace retrolisthesis. Diffuse disc bulge with disc desiccation and intervertebral disc space narrowing. Mild chronic reactive endplate changes with marginal endplate spurring. No significant canal or lateral recess stenosis. Foramina remain patent. IMPRESSION: 1. No acute abnormality within the lumbar spine. 2. Mild degenerative spondylolysis at L5-S1 without significant stenosis or neural impingement. 3. Otherwise normal MRI of the lumbar spine. No other significant disc pathology or stenosis. No neural impingement. Electronically Signed   By: Rise Mu M.D.   On: 12/13/2017 22:18        Scheduled Meds: . bictegravir-emtricitabine-tenofovir AF  1 tablet Oral Daily  . famotidine  20 mg Oral BID  . feeding supplement (ENSURE ENLIVE)  237 mL Oral TID BM  . fluconazole  200 mg Oral QHS  . hydrocortisone sod succinate (SOLU-CORTEF) inj  50 mg Intravenous Q12H   . metroNIDAZOLE  500 mg Oral Q8H  . multivitamin with minerals  1 tablet Oral Daily  . potassium chloride SA  40 mEq Oral BID  . sulfamethoxazole-trimethoprim  1 tablet Oral Daily   Continuous Infusions: . vancomycin Stopped (12/14/17 1236)     LOS: 5 days     Pleas Koch, MD [facility] (P) [phone]  If 7PM-7AM, please contact night-coverage [email or web address] Password [facility] 12/14/2017, 6:38 PM

## 2017-12-14 NOTE — Progress Notes (Signed)
Sent Biktarvy/Septra to [employer] for ADAP purposes.

## 2017-12-14 NOTE — Progress Notes (Signed)
Subjective:  Most of conversation was about where he would go if he was DC from hospital  Antibiotics:  Anti-infectives (From admission, onward)   Start     Dose/Rate Route Frequency Ordered Stop   12/14/17 1400  dalbavancin (DALVANCE) 1,500 mg in dextrose 5 % 250 mL IVPB     1,500 mg 650 mL/hr over 30 Minutes Intravenous  Once 12/14/17 1131     12/14/17 1400  sulfamethoxazole-trimethoprim (BACTRIM DS,SEPTRA DS) 800-160 MG per tablet 1 tablet     1 tablet Oral Daily 12/14/17 1147     12/13/17 2200  fluconazole (DIFLUCAN) tablet 200 mg     200 mg Oral Daily at bedtime 12/13/17 1237     12/13/17 1400  metroNIDAZOLE (FLAGYL) tablet 500 mg     500 mg Oral Every 8 hours 12/13/17 1229     12/12/17 2200  vancomycin (VANCOCIN) 1,250 mg in sodium chloride 0.9 % 250 mL IVPB     1,250 mg 166.7 mL/hr over 90 Minutes Intravenous Every 12 hours 12/12/17 1200     12/10/17 1600  bictegravir-emtricitabine-tenofovir AF (BIKTARVY) 50-200-25 MG per tablet 1 tablet     1 tablet Oral Daily 12/10/17 1505     12/10/17 0400  meropenem (MERREM) 1 g in sodium chloride 0.9 % 100 mL IVPB  Status:  Discontinued     1 g 200 mL/hr over 30 Minutes Intravenous Every 8 hours 12/10/17 0208 12/12/17 0916   12/10/17 0300  vancomycin (VANCOCIN) IVPB 1000 mg/200 mL premix  Status:  Discontinued     1,000 mg 200 mL/hr over 60 Minutes Intravenous Every 8 hours 12/09/17 1912 12/12/17 1200   12/09/17 2200  gentamicin (GARAMYCIN) 390 mg in dextrose 5 % 100 mL IVPB  Status:  Discontinued     5 mg/kg  77.1 kg 219.5 mL/hr over 30 Minutes Intravenous Every 24 hours 12/09/17 2146 12/09/17 2339   12/09/17 2000  fluconazole (DIFLUCAN) IVPB 200 mg  Status:  Discontinued     200 mg 100 mL/hr over 60 Minutes Intravenous Every 24 hours 12/09/17 1915 12/13/17 1237   12/09/17 1915  vancomycin (VANCOCIN) IVPB 1000 mg/200 mL premix     1,000 mg 200 mL/hr over 60 Minutes Intravenous  Once 12/09/17 1906 12/09/17 2057   12/09/17 1915  vancomycin (VANCOCIN) 500 mg in sodium chloride 0.9 % 100 mL IVPB     500 mg 100 mL/hr over 60 Minutes Intravenous  Once 12/09/17 1912 12/09/17 2057      Medications: Scheduled Meds: . bictegravir-emtricitabine-tenofovir AF  1 tablet Oral Daily  . famotidine  20 mg Oral BID  . feeding supplement (ENSURE ENLIVE)  237 mL Oral TID BM  . fluconazole  200 mg Oral QHS  . hydrocortisone sod succinate (SOLU-CORTEF) inj  50 mg Intravenous Q12H  . metroNIDAZOLE  500 mg Oral Q8H  . multivitamin with minerals  1 tablet Oral Daily  . potassium chloride SA  40 mEq Oral BID  . sulfamethoxazole-trimethoprim  1 tablet Oral Daily   Continuous Infusions: . dalbavancin (DALVANCE) IVPB    . vancomycin Stopped (12/14/17 1236)   PRN Meds:.acetaminophen **OR** acetaminophen, magic mouthwash w/lidocaine, ondansetron **OR** ondansetron (ZOFRAN) IV, oxybutynin    Objective: Weight change:   Intake/Output Summary (Last 24 hours) at 12/14/2017 1219 Last data filed at 12/14/2017 1020 Gross per 24 hour  Intake 1610 ml  Output 2450 ml  Net -840 ml   Blood pressure (!) 136/91, pulse (!) 47, temperature 98  F (36.7 C), resp. rate 16, height  (1.854 m), weight 170 lb (77.1 kg), SpO2 100 %. Temp:  [97.5 F (36.4 C)-98 F (36.7 C)] 98 F (36.7 C) (05/23 0602) Pulse Rate:  [46-47] 47 (05/23 0602) Resp:  [16] 16 (05/22 2306) BP: (126-136)/(91-92) 136/91 (05/23 0602) SpO2:  [100 %] 100 % (05/23 0602)  Physical Exam: General: Alert and awake, oriented x3, not in any acute distress. Pulmonary no wheezes or respiratory distress  Cardiovascular exam regular rate and rhythm  Abdomen soft nondistended Neuro: no new focal deficits  CBC: CBC Latest Ref Rng & Units 12/13/2017 12/12/2017 12/11/2017  WBC 4.0 - 10.5 K/uL 1.8(L) 1.8(L) 2.7(L)  Hemoglobin 13.0 - 17.0 g/dL 1.3(Y) 8.6(V) 7.8(I)  Hematocrit 39.0 - 52.0 % 29.1(L) 26.9(L) 26.6(L)  Platelets 150 - 400 K/uL 143(L) 128(L) 135(L)       BMET Recent Labs    12/12/17 0522 12/13/17 0559 12/14/17 0609  NA 133* 137  --   K 3.2* 2.9*  --   CL 105 106  --   CO2 19* 22  --   GLUCOSE 150* 104*  --   BUN 15 19  --   CREATININE 0.90 0.83 0.78  CALCIUM 8.1* 8.1*  --      Liver Panel  No results for input(s): PROT, ALBUMIN, AST, ALT, ALKPHOS, BILITOT, BILIDIR, IBILI in the last 72 hours.     Sedimentation Rate No results for input(s): ESRSEDRATE in the last 72 hours. C-Reactive Protein No results for input(s): CRP in the last 72 hours.  Micro Results: Recent Results (from the past 720 hour(s))  Fungus Culture With Stain     Status: None (Preliminary result)   Collection Time: 12/09/17 11:24 PM  Result Value Ref Range Status   Fungus Stain Final report  Final    Comment: (NOTE) Performed At: [facility] [address] [place], [place] [id number] Jolene Schimke MD XL:[id number]    Fungus (Mycology) Culture PENDING  Incomplete   Fungal Source TISSUE  Final    Comment: Performed at [facility], [address], [place], [place] [address]  Aerobic/Anaerobic Culture (surgical/deep wound)     Status: None (Preliminary result)   Collection Time: 12/09/17 11:24 PM  Result Value Ref Range Status   Specimen Description   Final    ABSCESS Performed at [facility], [address], [place], [place] [address]    Special Requests   Final    PROSTATE TISSUE Performed at [facility], [address] [place], [place] [address]    Gram Stain   Final    MODERATE WBC PRESENT, PREDOMINANTLY MONONUCLEAR FEW GRAM POSITIVE COCCI IN PAIRS    Culture   Final    HOLDING FOR POSSIBLE ANAEROBE Performed at [facility], [address], [place], [place] [address]    Report Status PENDING  Incomplete  Acid Fast Smear (AFB)     Status: None   Collection Time: 12/09/17 11:24 PM  Result Value Ref Range Status   AFB Specimen Processing Concentration   Final   Acid Fast Smear Negative  Final    Comment: (NOTE) Performed At: [facility] [address] [place], [place] [id number] Jolene Schimke MD CZ:[id number]    Source (AFB) TISSUE  Final    Comment: Performed at [facility], [address] [place], [place] [address]  Fungus Culture Result     Status: None   Collection Time: 12/09/17 11:24 PM  Result Value Ref Range Status   Result 1 Comment  Final    Comment: (NOTE) KOH/Calcofluor preparation:  no fungus observed. Performed At: [facility] [address] [place], [place] [id number] Jolene Schimke MD JX:[id number] Performed at [facility], [address], [place], [place] [address]   Aerobic/Anaerobic Culture (surgical/deep wound)     Status: None (Preliminary result)   Collection Time: 12/09/17 11:28 PM  Result Value Ref Range Status   Specimen Description   Final    ABSCESS Performed at [facility], [address] [place], [place] [address]    Special Requests   Final    PROSTATE ABSCESS DRAIN Performed at [facility], [address], [place], [place] [address]    Gram Stain   Final    MODERATE WBC PRESENT, PREDOMINANTLY PMN MODERATE GRAM POSITIVE COCCI    Culture   Final    HOLDING FOR POSSIBLE ANAEROBE Performed at [facility], [address], [place], [place] [address]    Report Status PENDING  Incomplete  MRSA PCR Screening     Status: None   Collection Time: 12/10/17  1:06 AM  Result Value Ref Range Status   MRSA by PCR NEGATIVE NEGATIVE Final    Comment:        The GeneXpert MRSA Assay (FDA approved for NASAL specimens only), is one component of a comprehensive MRSA colonization surveillance program. It is not intended to diagnose MRSA infection nor to guide or monitor treatment for MRSA infections. Performed at [facility], [address] [place], [place] [address]    Culture, Urine     Status: None   Collection Time: 12/10/17  1:40 AM  Result Value Ref Range Status   Specimen Description   Final    URINE, CLEAN CATCH Performed at [facility], [address], [place], [place] [address]    Special Requests   Final    NONE Performed at [facility], [address], [place], [place] [address]    Culture   Final    NO GROWTH Performed at [facility], [address], [place], [place] [address]    Report Status 12/11/2017 FINAL  Final  Culture, blood (Routine X 2) w Reflex to ID Panel     Status: None (Preliminary result)   Collection Time: 12/10/17  8:36 AM  Result Value Ref Range Status   Specimen Description   Final    BLOOD RIGHT ANTECUBITAL Performed at [facility], [address], [place], [place] [address]    Special Requests   Final    BOTTLES DRAWN AEROBIC ONLY Blood Culture results may not be optimal due to an inadequate volume of blood received in culture bottles Performed at [facility], [address], [place], [place] [address]    Culture   Final    NO GROWTH 3 DAYS Performed at [facility], [address] [place], [place] [address]    Report Status PENDING  Incomplete  Culture, blood (Routine X 2) w Reflex to ID Panel     Status: None (Preliminary result)   Collection Time: 12/10/17  8:37 AM  Result Value Ref Range Status   Specimen Description   Final    BLOOD BLOOD LEFT HAND Performed at [facility], [address] [place], [place] [address]    Special Requests   Final    BOTTLES DRAWN  AEROBIC ONLY Blood Culture adequate volume Performed at [facility], [address] [place], [place] [address]    Culture   Final    NO GROWTH 3 DAYS Performed at [facility], [address], [place], [place] [address]    Report Status PENDING  Incomplete    Studies/Results: Mr Laqueta Jean Wo  Contrast  Result Date: 12/13/2017 CLINICAL DATA:  Initial evaluation for headache, lower extremity weakness. Status post recent TURP with prostatic abscess. EXAM: MRI HEAD WITHOUT AND WITH CONTRAST TECHNIQUE: Multiplanar, multiecho pulse sequences of the brain and surrounding structures were obtained without and with intravenous contrast. CONTRAST:  15mL MULTIHANCE GADOBENATE DIMEGLUMINE 529 MG/ML IV SOLN COMPARISON:  Prior brain MRI from 03/06/2017. FINDINGS: Brain: Examination mildly limited by patient positioning and motion artifact. Mildly advanced cerebral atrophy for age. Encephalomalacia with gliosis within the parasagittal left parietal lobe consistent with remote ischemic infarct. No abnormal foci of restricted diffusion to suggest acute or subacute ischemia. Gray-white matter differentiation maintained. No other areas of chronic infarction. No acute or chronic intracranial hemorrhage. No mass lesion, midline shift or mass effect. No hydrocephalus. No extra-axial fluid collection. Major dural sinuses grossly patent. No abnormal enhancement. Pituitary gland suprasellar region normal. Midline structures intact and normal. Vascular: Major intracranial vascular flow voids maintained. Skull and upper cervical spine: Craniocervical junction within normal limits. Visualized upper cervical spine normal. Bone marrow signal intensity normal. No scalp soft tissue abnormality. Sinuses/Orbits: Globes and orbital soft tissues within normal limits. Paranasal sinuses are clear. No significant mastoid effusion. Other: None. IMPRESSION: 1. No acute intracranial abnormality. 2. Mildly advanced cerebral atrophy for age with remote left parietal lobe infarct. Electronically Signed   By: Rise Mu M.D.   On: 12/13/2017 21:45   Mr Lumbar Spine W Wo Contrast  Result Date: 12/13/2017 CLINICAL DATA:  Initial evaluation for lower extremity weakness. Recent TURP with prostatic abscess. History of HIV. EXAM: MRI LUMBAR  SPINE WITHOUT AND WITH CONTRAST TECHNIQUE: Multiplanar and multiecho pulse sequences of the lumbar spine were obtained without and with intravenous contrast. CONTRAST:  15mL MULTIHANCE GADOBENATE DIMEGLUMINE 529 MG/ML IV SOLN COMPARISON:  None. FINDINGS: Segmentation:  Study degraded by motion artifact. Normal segmentation. Lowest well-formed disc labeled the L5-S1 level. Alignment: Trace retrolisthesis of L5 on S1. Vertebral bodies otherwise normally aligned with preservation of the normal lumbar lordosis. Vertebrae: Vertebral body heights well maintained without evidence for acute or chronic fracture. Bone marrow signal intensity within normal limits. No discrete or worrisome osseous lesions. No abnormal marrow edema or enhancement. No findings to suggest discitis or other acute infection. Conus medullaris and cauda equina: Conus extends to the L1 level. Conus and cauda equina appear normal. No epidural collections. Paraspinal and other soft tissues: Paraspinous soft tissues within normal limits. Partially visualized visceral structures unremarkable. Disc levels: No significant findings are seen through the L4-5 level. L5-S1: Trace retrolisthesis. Diffuse disc bulge with disc desiccation and intervertebral disc space narrowing. Mild chronic reactive endplate changes with marginal endplate spurring. No significant canal or lateral recess stenosis. Foramina remain patent. IMPRESSION: 1. No acute abnormality within the lumbar spine. 2. Mild degenerative spondylolysis at L5-S1 without significant stenosis or neural impingement. 3. Otherwise normal MRI of the lumbar spine. No other significant disc pathology or stenosis. No neural impingement. Electronically Signed   By: Rise Mu M.D.   On: 12/13/2017 22:18      Assessment/Plan:  INTERVAL HISTORY:  MRIs of brain and spine re-assuring Principal Problem:  Sepsis (HCC) Active Problems:   AIDS (acquired immune deficiency syndrome) (HCC)   Personal  history of MRSA (methicillin resistant Staphylococcus aureus)   Prostatitis, acute   History of ESBL E. coli infection   Oral candida   Hemoptysis   Prostate abscess   Protein-calorie malnutrition, severe   Esophagitis   Weakness of both lower extremities    Jacob Gutierrez is a 32 y.o. male with HIV and AIDS is not been compliant with his antiretroviral regimen admitted now with prostate abscess status post transurethral resection of the prostate with cultures having been sent with Gram stain showing moderate gram-positive cocci in pairs but cultures so far not yielding an organism.  We have narrowed him to vancomycin though I added metronidazole yesterday to cover for anaerobes  1.  Prostate abscess: likely an enterococcal infection +/- anerobes Dosing with  DALBAVANCIN today and continue metronidazole  po tid  He would likely need a 2nd dose of DALBAVANCIN vs another viable oral optio through pharma assistance such as a newer tetracycline at SNF or in oupatient world (see below)   2. HIV/AIDS: On BIKTARVY,  And start Bactrim for PCP prevention  We will provide with 30 day of BIKTARVY secured at [facility] via [employer] adv access  AS far as getting him on emergency HMAP I am calling MIchelle to see if she can help out.  [name] will not be able to engage with patient until next week (though he may also be booked that week)   3 Weakness Check RPR, MRIs reassuring and crypto negative.    Apparently his family will not take him at this time.  I AM ALSO VERY CONCERNED IF HE IS EVEN SAFE TO DC FROM HOSPITAL HAVING READ PT NOTE  PERHAPS IT WOULD BE WISER TO KEEP HIM UNTIL HE HAS MADE FURTHER PROGRESS OR PLACE HIM IN SNF UNTIL HE RECOVERS STRENGTH  He will be at VERY, VERY high risk for readmission and I worry about his safety given his physical limitations in a shelter.      LOS: 5 days   Acey Lav 12/14/2017, 12:19 PM

## 2017-12-14 NOTE — Progress Notes (Signed)
LCSW provided patient with shelter resources.  Patient has no resources. Financial Counseling to see patient regarding medicaid.  Patient stated he did not have any questions regarding resources.   Beulah Gandy [place] [facility] CSW [phone]

## 2017-12-14 NOTE — Care Management Note (Signed)
Case Management Note  Patient Details  Name: PAYDEN DOCTER MRN: [id number] Date of Birth: 1986/06/13  Subjective/Objective:         [facility] dme           Action/Plan:  [facility] provide   Expected Discharge Date:                  Expected Discharge Plan:  Home w Home Health Services  In-House Referral:     Discharge planning Services     Post Acute Care Choice:    Choice offered to:     DME Arranged:  [facility] DME Agency:  [facility]  HH Arranged:    [facility] Agency:     Status of Service:  Completed, signed off  If discussed at Long Length of Stay Meetings, dates discussed:    Additional Comments:  Golda Acre, RN 12/14/2017, 12:24 PM

## 2017-12-14 NOTE — Evaluation (Signed)
Physical Therapy Evaluation Patient Details Name: Jacob Gutierrez MRN: [id number] DOB: 06-Nov-1985 Today's Date: 12/14/2017   History of Present Illness  32 y.o. male with medical history significant of HIV/AIDS, last CD4 count was 10, has been off of HAART for past year.  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  Pt admitted with sepsis, prostate abscess s/p I&D 12/09/17. Pt reports new onset of BLE weakness, lumbar imaging negative for nerve/cord compression, brain imaging showed remote CVA (pt reports not knowing that he had a stroke in the past).   Clinical Impression  Pt admitted with above diagnosis. Pt currently with functional limitations due to the deficits listed below (see PT Problem List). Pt ambulated 180' without an assistive device with very mild unsteadiness. Pt would benefit from use of a cane. His DC plan is uncertain, he stated his mother would be making decisions about where he will stay upon DC. He rented a room in [place] prior to admission, can no longer afford this, his parents want him to stay in [place] but he can't stay with them. "It's complicated but we're working on a plan".  Pt will benefit from skilled PT to increase their independence and safety with mobility to allow discharge to the venue listed below.       Follow Up Recommendations No PT follow up    Equipment Recommendations  Cane    Recommendations for Other Services       Precautions / Restrictions Precautions Precautions: Fall Precaution Comments: pt denies h/o falls in past 1 year, mildly unsteady gait but no loss of balance today Restrictions Weight Bearing Restrictions: No      Mobility  Bed Mobility Overal bed mobility: Modified Independent             General bed mobility comments: HOB up 25*  Transfers Overall transfer level: Independent                   Ambulation/Gait Ambulation/Gait assistance: Modified independent (Device/Increase time);Independent Ambulation Distance (Feet): 180 Feet Assistive device: None Gait Pattern/deviations: WFL(Within Functional Limits) Gait velocity: WFL   General Gait Details: occaissionally reached for wall or rail for support, very mild unsteadiness but no overt loss of balance, pt stated he's open to using a cane, distance limited by fatigue  Stairs            Wheelchair Mobility    Modified Rankin (Stroke Patients Only)       Balance     Sitting balance-Leahy Scale: Normal       Standing balance-Leahy Scale: Good                               Pertinent Vitals/Pain Pain Assessment: No/denies pain    Home Living Family/patient expects to be discharged to:: Unsure                 Additional Comments: pt rented a room in [place] prior to admission, doesn't think he can pay rent bc he hasn't been working while ill, states his parents live in [place] and want him to stay here but that he can't live with them, pt stated "we're working on a plan" and stated his mother is the person who will make housing decisions for him    Prior Function Level of Independence: Independent  Comments: pt worked at a grocery store in [employer]        Extremity/Trunk Assessment   Upper Extremity Assessment Upper Extremity Assessment: Overall WFL for tasks assessed    Lower Extremity Assessment Lower Extremity Assessment: Overall WFL for tasks assessed(sensation intact to light touch BLEs)    Cervical / Trunk Assessment Cervical / Trunk Assessment: Normal  Communication   Communication: No difficulties  Cognition Arousal/Alertness: Awake/alert Behavior During Therapy: WFL for tasks assessed/performed Overall Cognitive Status: Within Functional Limits for tasks assessed                                         General Comments      Exercises     Assessment/Plan    PT Assessment Patient needs continued PT services  PT Problem List Decreased balance;Decreased activity tolerance       PT Treatment Interventions Functional mobility training;DME instruction;Gait training;Balance training;Patient/family education    PT Goals (Current goals can be found in the Care Plan section)  Acute Rehab PT Goals Patient Stated Goal: to get strength back, return to work PT Goal Formulation: With patient Time For Goal Achievement: 12/28/17 Potential to Achieve Goals: Good    Frequency Min 3X/week   Barriers to discharge        Co-evaluation               AM-PAC PT "6 Clicks" Daily Activity  Outcome Measure Difficulty turning over in bed (including adjusting bedclothes, sheets and blankets)?: None Difficulty moving from lying on back to sitting on the side of the bed? : None Difficulty sitting down on and standing up from a chair with arms (e.g., wheelchair, bedside commode, etc,.)?: None Help needed moving to and from a bed to chair (including a wheelchair)?: None Help needed walking in hospital room?: None Help needed climbing 3-5 steps with a railing? : A Little 6 Click Score: 23    End of Session Equipment Utilized During Treatment: Gait belt Activity Tolerance: Patient tolerated treatment well Patient left: in bed;with call bell/phone within reach Nurse Communication: Mobility status PT Visit Diagnosis: Unsteadiness on feet (R26.81);Difficulty in walking, not elsewhere classified (R26.2)    Time: 1610-9604 PT Time Calculation (min) (ACUTE ONLY): 16 min   Charges:   PT Evaluation $PT Eval Low Complexity: 1 Low     PT G Codes:          Tamala Ser 12/14/2017, 11:00 AM [phone]

## 2017-12-15 LAB — CBC WITH DIFFERENTIAL/PLATELET
BASOS ABS: 0 10*3/uL (ref 0.0–0.1)
Basophils Relative: 1 %
EOS ABS: 0.3 10*3/uL (ref 0.0–0.7)
Eosinophils Relative: 12 %
HEMATOCRIT: 32.4 % — AB (ref 39.0–52.0)
HEMOGLOBIN: 10.9 g/dL — AB (ref 13.0–17.0)
LYMPHS PCT: 29 %
Lymphs Abs: 0.6 10*3/uL — ABNORMAL LOW (ref 0.7–4.0)
MCH: 30.4 pg (ref 26.0–34.0)
MCHC: 33.6 g/dL (ref 30.0–36.0)
MCV: 90.3 fL (ref 78.0–100.0)
MONOS PCT: 35 %
Monocytes Absolute: 0.7 10*3/uL (ref 0.1–1.0)
NEUTROS ABS: 0.5 10*3/uL — AB (ref 1.7–7.7)
NEUTROS PCT: 23 %
Platelets: 189 10*3/uL (ref 150–400)
RBC: 3.59 MIL/uL — ABNORMAL LOW (ref 4.22–5.81)
RDW: 14.3 % (ref 11.5–15.5)
WBC: 2.1 10*3/uL — ABNORMAL LOW (ref 4.0–10.5)

## 2017-12-15 LAB — BASIC METABOLIC PANEL
ANION GAP: 9 (ref 5–15)
BUN: 18 mg/dL (ref 6–20)
CALCIUM: 8.3 mg/dL — AB (ref 8.9–10.3)
CO2: 25 mmol/L (ref 22–32)
Chloride: 104 mmol/L (ref 101–111)
Creatinine, Ser: 0.66 mg/dL (ref 0.61–1.24)
GFR calc Af Amer: >60 mL/min (ref >60)
GFR calc non Af Amer: >60 mL/min (ref >60)
GLUCOSE: 106 mg/dL — AB (ref 65–99)
Potassium: 3.3 mmol/L — ABNORMAL LOW (ref 3.5–5.1)
Sodium: 138 mmol/L (ref 135–145)

## 2017-12-15 LAB — CULTURE, BLOOD (ROUTINE X 2)
CULTURE: NO GROWTH
CULTURE: NO GROWTH
SPECIAL REQUESTS: ADEQUATE

## 2017-12-15 MED ORDER — FLUCONAZOLE 200 MG PO TABS: 200.0000 mg | ORAL_TABLET | Freq: Every day | ORAL | 0 refills | Status: DC

## 2017-12-15 MED ORDER — OXYBUTYNIN CHLORIDE 5 MG PO TABS: 5.0000 mg | ORAL_TABLET | Freq: Three times a day (TID) | ORAL | 0 refills | Status: DC | PRN

## 2017-12-15 MED ORDER — METRONIDAZOLE 500 MG PO TABS: 500.0000 mg | ORAL_TABLET | Freq: Three times a day (TID) | ORAL | 0 refills | Status: DC

## 2017-12-15 MED ORDER — SULFAMETHOXAZOLE-TRIMETHOPRIM 800-160 MG PO TABS: 1.0000 | ORAL_TABLET | Freq: Every day | ORAL | 3 refills | Status: DC

## 2017-12-15 NOTE — Progress Notes (Signed)
Discharge instructions and medications discussed with patient.  AVS and prescriptions given to patient.  All questions answered.  

## 2017-12-15 NOTE — Progress Notes (Addendum)
Physical Therapy Treatment Patient Details Name: Jacob Gutierrez MRN: [id number] DOB: March 10, 1986 Today's Date: 12/15/2017    History of Present Illness 32 y.o. male with medical history significant of HIV/AIDS, last CD4 count was 10, has been off of HAART for past year.  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  Pt admitted with sepsis, prostate abscess s/p I&D 12/09/17    PT Comments    Pt ambulated 180' with a straight cane with no loss of balance. Encouraged pt to independently ambulate in halls at least TID for strengthening. Instructed pt in LE strengthening exercises also to be done independently. PT goals met, will sign off.    Follow Up Recommendations  No PT follow up     Equipment Recommendations  Cane    Recommendations for Other Services       Precautions / Restrictions Precautions Precautions: Fall Precaution Comments: pt denies h/o falls in past 1 year Restrictions Weight Bearing Restrictions: No    Mobility  Bed Mobility Overal bed mobility: Modified Independent             General bed mobility comments: HOB up 25*  Transfers Overall transfer level: Independent                  Ambulation/Gait Ambulation/Gait assistance: Modified independent (Device/Increase time) Ambulation Distance (Feet): 180 Feet Assistive device: Straight cane Gait Pattern/deviations: WFL(Within Functional Limits) Gait velocity: WFL   General Gait Details: steady with cane, no loss of balance, pt demonstrates good sequencing with cane   Stairs             Wheelchair Mobility    Modified Rankin (Stroke Patients Only)       Balance     Sitting balance-Leahy Scale: Normal       Standing balance-Leahy Scale: Good                              Cognition Arousal/Alertness: Awake/alert Behavior During  Therapy: WFL for tasks assessed/performed Overall Cognitive Status: Within Functional Limits for tasks assessed                                        Exercises General Exercises - Lower Extremity Long Arc Quad: (instructed pt to perform these independently) Hip Flexion/Marching: (instrcted pt to perform seated marching for strengthening)    General Comments        Pertinent Vitals/Pain Pain Assessment: No/denies pain    Home Living                      Prior Function            PT Goals (current goals can now be found in the care plan section) Acute Rehab PT Goals Patient Stated Goal: to get strength back, return to work PT Goal Formulation: With patient Time For Goal Achievement: 12/28/17 Potential to Achieve Goals: Good Progress towards PT goals: Goals met/education completed, patient discharged from PT    Frequency    Min 3X/week      PT Plan Current plan remains appropriate    Co-evaluation              AM-PAC PT "6 Clicks" Daily Activity  Outcome Measure  Difficulty  turning over in bed (including adjusting bedclothes, sheets and blankets)?: None Difficulty moving from lying on back to sitting on the side of the bed? : None Difficulty sitting down on and standing up from a chair with arms (e.g., wheelchair, bedside commode, etc,.)?: None Help needed moving to and from a bed to chair (including a wheelchair)?: None Help needed walking in hospital room?: None Help needed climbing 3-5 steps with a railing? : None 6 Click Score: 24    End of Session   Activity Tolerance: Patient tolerated treatment well Patient left: in bed;with call bell/phone within reach Nurse Communication: Mobility status; LUE IV site painful with mild swelling, pt safe to walk in halls independently PT Visit Diagnosis: Unsteadiness on feet (R26.81);Difficulty in walking, not elsewhere classified (R26.2)     Time: 1201-1209 PT Time Calculation (min)  (ACUTE ONLY): 8 min  Charges:  $Gait Training: 8-22 mins                    G Codes:          Philomena Doheny 12/15/2017, 12:19 PM [phone]

## 2017-12-15 NOTE — Progress Notes (Signed)
Subjective:  No new complaints   \Antibiotics:  Anti-infectives (From admission, onward)   Start     Dose/Rate Route Frequency Ordered Stop   12/14/17 1400  dalbavancin (DALVANCE) 1,500 mg in dextrose 5 % 250 mL IVPB     1,500 mg 650 mL/hr over 30 Minutes Intravenous  Once 12/14/17 1131 12/14/17 1421   12/14/17 1400  sulfamethoxazole-trimethoprim (BACTRIM DS,SEPTRA DS) 800-160 MG per tablet 1 tablet     1 tablet Oral Daily 12/14/17 1147     12/13/17 2200  fluconazole (DIFLUCAN) tablet 200 mg     200 mg Oral Daily at bedtime 12/13/17 1237     12/13/17 1400  metroNIDAZOLE (FLAGYL) tablet 500 mg     500 mg Oral Every 8 hours 12/13/17 1229     12/12/17 2200  vancomycin (VANCOCIN) 1,250 mg in sodium chloride 0.9 % 250 mL IVPB  Status:  Discontinued     1,250 mg 166.7 mL/hr over 90 Minutes Intravenous Every 12 hours 12/12/17 1200 12/15/17 0906   12/10/17 1600  bictegravir-emtricitabine-tenofovir AF (BIKTARVY) 50-200-25 MG per tablet 1 tablet     1 tablet Oral Daily 12/10/17 1505     12/10/17 0400  meropenem (MERREM) 1 g in sodium chloride 0.9 % 100 mL IVPB  Status:  Discontinued     1 g 200 mL/hr over 30 Minutes Intravenous Every 8 hours 12/10/17 0208 12/12/17 0916   12/10/17 0300  vancomycin (VANCOCIN) IVPB 1000 mg/200 mL premix  Status:  Discontinued     1,000 mg 200 mL/hr over 60 Minutes Intravenous Every 8 hours 12/09/17 1912 12/12/17 1200   12/09/17 2200  gentamicin (GARAMYCIN) 390 mg in dextrose 5 % 100 mL IVPB  Status:  Discontinued     5 mg/kg  77.1 kg 219.5 mL/hr over 30 Minutes Intravenous Every 24 hours 12/09/17 2146 12/09/17 2339   12/09/17 2000  fluconazole (DIFLUCAN) IVPB 200 mg  Status:  Discontinued     200 mg 100 mL/hr over 60 Minutes Intravenous Every 24 hours 12/09/17 1915 12/13/17 1237   12/09/17 1915  vancomycin (VANCOCIN) IVPB 1000 mg/200 mL premix     1,000 mg 200 mL/hr over 60 Minutes Intravenous  Once 12/09/17 1906 12/09/17 2057   12/09/17 1915   vancomycin (VANCOCIN) 500 mg in sodium chloride 0.9 % 100 mL IVPB     500 mg 100 mL/hr over 60 Minutes Intravenous  Once 12/09/17 1912 12/09/17 2057      Medications: Scheduled Meds: . bictegravir-emtricitabine-tenofovir AF  1 tablet Oral Daily  . famotidine  20 mg Oral BID  . feeding supplement (ENSURE ENLIVE)  237 mL Oral TID BM  . fluconazole  200 mg Oral QHS  . metroNIDAZOLE  500 mg Oral Q8H  . multivitamin with minerals  1 tablet Oral Daily  . potassium chloride SA  40 mEq Oral BID  . sulfamethoxazole-trimethoprim  1 tablet Oral Daily   Continuous Infusions:  PRN Meds:.acetaminophen **OR** acetaminophen, magic mouthwash w/lidocaine, ondansetron **OR** ondansetron (ZOFRAN) IV, oxybutynin    Objective: Weight change:   Intake/Output Summary (Last 24 hours) at 12/15/2017 1456 Last data filed at 12/15/2017 0823 Gross per 24 hour  Intake 825 ml  Output 1325 ml  Net -500 ml   Blood pressure (!) 129/95, pulse 72, temperature 98.3 F (36.8 C), temperature source Oral, resp. rate 18, height 6' 1" (1.854 m), weight 170 lb (77.1 kg), SpO2 100 %. Temp:  [98.1 F (36.7 C)-98.3 F (36.8 C)] 98.3  F (36.8 C) (05/24 0520) Pulse Rate:  [49-72] 72 (05/24 1428) Resp:  [18] 18 (05/24 0520) BP: (127-129)/(90-95) 129/95 (05/24 1428) SpO2:  [100 %] 100 % (05/24 1428)  Physical Exam: General: Alert and awake, oriented x3, not in any acute distress. Pulmonary no wheezes or respiratory distress  Cardiovascular exam regular rate and rhythm  Abdomen soft nondistended Neuro: no new focal deficits  CBC: CBC Latest Ref Rng & Units 12/15/2017 12/13/2017 12/12/2017  WBC 4.0 - 10.5 K/uL 2.1(L) 1.8(L) 1.8(L)  Hemoglobin 13.0 - 17.0 g/dL 10.9(L) 9.7(L) 9.2(L)  Hematocrit 39.0 - 52.0 % 32.4(L) 29.1(L) 26.9(L)  Platelets 150 - 400 K/uL 189 143(L) 128(L)      BMET Recent Labs    12/13/17 0559 12/14/17 0609 12/15/17 0551  NA 137  --  138  K 2.9*  --  3.3*  CL 106  --  104  CO2 22  --   25  GLUCOSE 104*  --  106*  BUN 19  --  18  CREATININE 0.83 0.78 0.66  CALCIUM 8.1*  --  8.3*     Liver Panel  No results for input(s): PROT, ALBUMIN, AST, ALT, ALKPHOS, BILITOT, BILIDIR, IBILI in the last 72 hours.     Sedimentation Rate No results for input(s): ESRSEDRATE in the last 72 hours. C-Reactive Protein No results for input(s): CRP in the last 72 hours.  Micro Results: Recent Results (from the past 720 hour(s))  Fungus Culture With Stain     Status: None   Collection Time: 12/09/17 11:24 PM  Result Value Ref Range Status   Fungus Stain Final report  Final   Fungus (Mycology) Culture Preliminary report  Final    Comment: (NOTE) Performed At: [facility] [place], [place] [id number] Rush Farmer MD MC:[id number]    Fungal Source TISSUE  Final    Comment: Performed at [facility], [place] [address] [place], [place] [address]  Aerobic/Anaerobic Culture (surgical/deep wound)     Status: None   Collection Time: 12/09/17 11:24 PM  Result Value Ref Range Status   Specimen Description   Final    ABSCESS Performed at [place] [address], [place], [place] [address]    Special Requests   Final    PROSTATE TISSUE Performed at [facility], [place] [address], [place], [place] [address]    Gram Stain   Final    MODERATE WBC PRESENT, PREDOMINANTLY MONONUCLEAR FEW GRAM POSITIVE COCCI IN PAIRS    Culture   Final    FEW PREVOTELLA BIVIA BETA LACTAMASE POSITIVE Performed at [facility], [place] [address], [place], [place] [address]    Report Status 12/14/2017 FINAL  Final  Acid Fast Smear (AFB)     Status: None   Collection Time: 12/09/17 11:24 PM  Result Value Ref Range Status   AFB Specimen Processing Concentration  Final   Acid Fast Smear Negative  Final    Comment: (NOTE) Performed At: [facility] [place], [place] [id number] Rush Farmer MD  BS:[id number]    Source (AFB) TISSUE  Final    Comment: Performed at [facility], [place] [address], [place], [place] [address]  Fungus Culture Result     Status: None   Collection Time: 12/09/17 11:24 PM  Result Value Ref Range Status   Result 1 Comment  Final    Comment: (NOTE) KOH/Calcofluor preparation:  no fungus observed. Performed At: [facility]  [address] [place], [place] [id number] Nagendra Sanjai MD Ph:[phone] Performed at [facility], [address] [place],  [place] [address]   Fungal organism reflex     Status: None   Collection Time: 12/09/17 11:24 PM  Result Value Ref Range Status   Fungal result 1 Comment  Final    Comment: (NOTE) Yeast isolated, identification in progress. Moderate growth CALLED AND FAXED TO NATHAN ON 12/14/17 @5:00PM,DPL Performed At: [facility] [address] [place], [place] [id number] Nagendra Sanjai MD Ph:[phone] Performed at [facility], [address] [place], [place] [address]   Aerobic/Anaerobic Culture (surgical/deep wound)     Status: None   Collection Time: 12/09/17 11:28 PM  Result Value Ref Range Status   Specimen Description   Final    ABSCESS Performed at [facility], [address] [place], [place] [address]    Special Requests   Final    PROSTATE ABSCESS DRAIN Performed at [facility], [address] [place], [place] [address]    Gram Stain   Final    MODERATE WBC PRESENT, PREDOMINANTLY PMN MODERATE GRAM POSITIVE COCCI    Culture   Final    MODERATE PREVOTELLA BIVIA BETA LACTAMASE POSITIVE Performed at [facility], [address] [place], [place] [address]    Report Status 12/14/2017 FINAL  Final  MRSA PCR Screening     Status: None   Collection Time: 12/10/17  1:06 AM  Result Value Ref Range Status   MRSA by PCR NEGATIVE NEGATIVE Final    Comment:        The GeneXpert MRSA  Assay (FDA approved for NASAL specimens only), is one component of a comprehensive MRSA colonization surveillance program. It is not intended to diagnose MRSA infection nor to guide or monitor treatment for MRSA infections. Performed at [facility], [address] [place], [place] [address]   Culture, Urine     Status: None   Collection Time: 12/10/17  1:40 AM  Result Value Ref Range Status   Specimen Description   Final    URINE, CLEAN CATCH Performed at [facility], [address] [place], [place] [address]    Special Requests   Final    NONE Performed at [facility], [address] [place], [place] [address]    Culture   Final    NO GROWTH Performed at [facility], [address] [place], [place] [address]    Report Status 12/11/2017 FINAL  Final  Culture, blood (Routine X 2) w Reflex to ID Panel     Status: None   Collection Time: 12/10/17  8:36 AM  Result Value Ref Range Status   Specimen Description   Final    BLOOD RIGHT ANTECUBITAL Performed at [facility], [address] [place], [place] [address]    Special Requests   Final    BOTTLES DRAWN AEROBIC ONLY Blood Culture results may not be optimal due to an inadequate volume of blood received in culture bottles Performed at [facility], [address] [place], [place] [address]    Culture   Final    NO GROWTH 5 DAYS Performed at [facility], [address] ,  [address]    Report Status 12/15/2017 FINAL  Final  Culture, blood (Routine X 2) w Reflex to ID Panel     Status: None   Collection Time:   12/10/17  8:37 AM  Result Value Ref Range Status   Specimen Description   Final    BLOOD BLOOD LEFT HAND Performed at [place] [address], [place], [place] [address]    Special Requests   Final    BOTTLES DRAWN AEROBIC ONLY Blood Culture adequate  volume Performed at [place] [address] [place], [place] [address]    Culture   Final    NO GROWTH 5 DAYS Performed at [facility], [place] [address], [place], [place] [address]    Report Status 12/15/2017 FINAL  Final    Studies/Results: Mr Jeri Cos PF Contrast  Result Date: 12/13/2017 CLINICAL DATA:  Initial evaluation for headache, lower extremity weakness. Status post recent TURP with prostatic abscess. EXAM: MRI HEAD WITHOUT AND WITH CONTRAST TECHNIQUE: Multiplanar, multiecho pulse sequences of the brain and surrounding structures were obtained without and with intravenous contrast. CONTRAST:  71m MULTIHANCE GADOBENATE DIMEGLUMINE 529 MG/ML IV SOLN COMPARISON:  Prior brain MRI from 03/06/2017. FINDINGS: Brain: Examination mildly limited by patient positioning and motion artifact. Mildly advanced cerebral atrophy for age. Encephalomalacia with gliosis within the parasagittal left parietal lobe consistent with remote ischemic infarct. No abnormal foci of restricted diffusion to suggest acute or subacute ischemia. Gray-white matter differentiation maintained. No other areas of chronic infarction. No acute or chronic intracranial hemorrhage. No mass lesion, midline shift or mass effect. No hydrocephalus. No extra-axial fluid collection. Major dural sinuses grossly patent. No abnormal enhancement. Pituitary gland suprasellar region normal. Midline structures intact and normal. Vascular: Major intracranial vascular flow voids maintained. Skull and upper cervical spine: Craniocervical junction within normal limits. Visualized upper cervical spine normal. Bone marrow signal intensity normal. No scalp soft tissue abnormality. Sinuses/Orbits: Globes and orbital soft tissues within normal limits. Paranasal sinuses are clear. No significant mastoid effusion. Other: None. IMPRESSION: 1. No acute intracranial abnormality. 2. Mildly advanced cerebral atrophy for age with remote  left parietal lobe infarct. Electronically Signed   By: BJeannine BogaM.D.   On: 12/13/2017 21:45   Mr Lumbar Spine W Wo Contrast  Result Date: 12/13/2017 CLINICAL DATA:  Initial evaluation for lower extremity weakness. Recent TURP with prostatic abscess. History of HIV. EXAM: MRI LUMBAR SPINE WITHOUT AND WITH CONTRAST TECHNIQUE: Multiplanar and multiecho pulse sequences of the lumbar spine were obtained without and with intravenous contrast. CONTRAST:  137mMULTIHANCE GADOBENATE DIMEGLUMINE 529 MG/ML IV SOLN COMPARISON:  None. FINDINGS: Segmentation:  Study degraded by motion artifact. Normal segmentation. Lowest well-formed disc labeled the L5-S1 level. Alignment: Trace retrolisthesis of L5 on S1. Vertebral bodies otherwise normally aligned with preservation of the normal lumbar lordosis. Vertebrae: Vertebral body heights well maintained without evidence for acute or chronic fracture. Bone marrow signal intensity within normal limits. No discrete or worrisome osseous lesions. No abnormal marrow edema or enhancement. No findings to suggest discitis or other acute infection. Conus medullaris and cauda equina: Conus extends to the L1 level. Conus and cauda equina appear normal. No epidural collections. Paraspinal and other soft tissues: Paraspinous soft tissues within normal limits. Partially visualized visceral structures unremarkable. Disc levels: No significant findings are seen through the L4-5 level. L5-S1: Trace retrolisthesis. Diffuse disc bulge with disc desiccation and intervertebral disc space narrowing. Mild chronic reactive endplate changes with marginal endplate spurring. No significant canal or lateral recess stenosis. Foramina remain patent. IMPRESSION: 1. No acute abnormality within the lumbar spine. 2. Mild degenerative spondylolysis at L5-S1 without significant stenosis or neural impingement. 3.  Otherwise normal MRI of the lumbar spine. No other significant disc pathology or stenosis. No  neural impingement. Electronically Signed   By: Benjamin  McClintock M.D.   On: 12/13/2017 22:18      Assessment/Plan:  INTERVAL HISTORY:  MRIs of brain and spine re-assuring Principal Problem:   Sepsis (HCC) Active Problems:   AIDS (acquired immune deficiency syndrome) (HCC)   Personal history of MRSA (methicillin resistant Staphylococcus aureus)   Prostatitis, acute   History of ESBL E. coli infection   Oral candida   Hemoptysis   Prostate abscess   Protein-calorie malnutrition, severe   Esophagitis   Weakness of both lower extremities    Jacob Gutierrez is a 32 y.o. male with HIV and AIDS is not been compliant with his antiretroviral regimen admitted now with prostate abscess status post transurethral resection of the prostate with cultures having been sent with Gram stain showing moderate gram-positive cocci in pairs but cultures so far not yielding an organism.  We have narrowed him to vancomycin though I added metronidazole yesterday to cover for anaerobes  1.  Prostate abscess: likely an enterococcal infection + Prevotella  He got dosed with DALBAVANCIN yesterday  Plan on giving him something in clinic next week potentially to covere enterococcus, newwer tetracycline  We will also plan on 6 weeks or po metronidazole   2. HIV/AIDS: On BIKTARVY,  And start Bactrim for PCP prevention  We will provide with 30 day of BIKTARVY secured at [facility] via [facility] adv access  Jacob Gutierrez has met with him and Jacob Gutierrez sent orders for meds to [facility]. I am not sure if it might have gone through yet or not.   Will ask Jacob Gutierrez or Jacob Gutierrez to bring pt to clinic next week  3 Weakness MRIs reassuring and crypto negative. RPR is low titer 1:2 and unchanged  Disp: I had EXTENSIVE conversation with his Step Dad Jacob Gutierrez at [phone]  Step Dad is trying to secure a place for Jacob Gutierrez to live in. He states that they will make sure his cell phone is paid for (as they have always done). He  reiterated that they have been trying to work with him and that they know and HE knows that his HIV is completely controllable and that he COULD have a normal life expectancy and normal quality of life but that the patient is non aderehent with his medications. We will do what we can to tackle this whethert substance abuse or stigma of HIV or something else is getting in the way we will work on this and "meet him where he is"  I  spent greater than 35  minutes with the patient including greater than 50% of time in face to face counsel of the patient and step dad via phone re his HIV and need to be on meds and in stable housing  and in coordination of his  Care.with [facility] pharmacy, Case management PT and primary team.  I will arrange HSFU for him with us.      LOS: 6 days   Jacob Gutierrez 12/15/2017, 2:56 PM  

## 2017-12-15 NOTE — Progress Notes (Signed)
Patient refusing PO medications at this time

## 2017-12-15 NOTE — Progress Notes (Signed)
Pharmacy Antibiotic Note  Jacob Gutierrez is a 32 y.o. male admitted on 12/09/2017 with cough, congestion, chills, black sputum and sore throat. Pharmacy has been consulted for Fluconazole dosing for esophageal candidiasis.  PMH: AIDS, Shigella infection, Ecoli enteritis, Syphilis, Tobacco use, dysplasia of anus (history obtained from [facility] records).  Today, 12/15/2017:  SCr stable wnl  Remains afebrile  Prostate abscess x 2 + prevotella- s/p Dalbavancin dose + Flagyl PO x6 weeks  Notable for amoxicillin allergy; unknown childhood rxn but states he received a penicillin at [facility] several years ago following desensitization protocol.  Plan:  Continue Fluconazole  PO q24-->completes course 5/30  Flagyl  PO Q8h x 6weeks-->completes course 7/2  Medication assistance pending  No dose adjustments anticipated-->Pharmacy to sign off.  Please re-consult if needed.   Height:  (185.4 cm) Weight: 170 lb (77.1 kg) IBW/kg (Calculated) : 79.9  Temp (24hrs), Avg:98.2 F (36.8 C), Min:98.1 F (36.7 C), Max:98.3 F (36.8 C)  Recent Labs  Lab 12/09/17 1618 12/10/17 0340 12/10/17 1106 12/10/17 1305 12/11/17 0335 12/12/17 0522 12/13/17 0559 12/14/17 0609 12/15/17 0551  WBC  --  1.5*  --   --  2.7* 1.8* 1.8*  --  2.1*  CREATININE  --  0.95  --   --  0.86 0.90 0.83 0.78 0.66  LATICACIDVEN 0.52  --  1.6 2.4*  --   --   --   --   --     Estimated Creatinine Clearance: 141.9 mL/min (by C-G formula based on SCr of 0.66 mg/dL).    Allergies  Allergen Reactions  . Amoxicillin Other (See Comments)    From childhood: "I had a reaction when i was little." (??)   Antimicrobials this admission: Biktarvy PTA >> 5/18 vancomycin >>5/24 5/18 fluconazole >> 5/19 meropenem >> 5/21 5/22 Flagyl>> 5/23 Dalbavancin 5/23 Bactrim px>> Microbiology results: 5/19 MRSA PCR neg 5/19 Ucx>>NG-F 5/18 prostate abscess> prevotella, beta lactamase + 5/19 BCx2>>NGTD 5/18  prostate tissue>>prevotella, beta lactamase +   Thank you for allowing pharmacy to be part of this patient's care team.  Junita Push, PharmD, BCPS Pager: [phone] 12/15/2017, 9:15 AM

## 2017-12-26 ENCOUNTER — Encounter: Payer: Self-pay | Admitting: Infectious Diseases

## 2017-12-26 ENCOUNTER — Ambulatory Visit (INDEPENDENT_AMBULATORY_CARE_PROVIDER_SITE_OTHER): Payer: Self-pay | Admitting: Infectious Diseases

## 2017-12-26 ENCOUNTER — Other Ambulatory Visit: Payer: Self-pay | Admitting: Pharmacist

## 2017-12-26 VITALS — BP 132/91 | HR 116 | Temp 98.1°F | Ht 73.0 in | Wt 169.0 lb

## 2017-12-26 DIAGNOSIS — N412 Abscess of prostate: Secondary | ICD-10-CM

## 2017-12-26 DIAGNOSIS — K209 Esophagitis, unspecified without bleeding: Secondary | ICD-10-CM

## 2017-12-26 DIAGNOSIS — B370 Candidal stomatitis: Secondary | ICD-10-CM

## 2017-12-26 DIAGNOSIS — B20 Human immunodeficiency virus [HIV] disease: Secondary | ICD-10-CM

## 2017-12-26 LAB — HIV GENOSURE(R) MG

## 2017-12-26 LAB — REFLEX TO GENOSURE(R) MG EDI: HIV GenoSure(R): 1

## 2017-12-26 LAB — HIV-1 RNA, PCR (GRAPH) RFX/GENO EDI
HIV-1 RNA BY PCR: 563000 {copies}/mL
HIV-1 RNA QUANT, LOG: 5.751 {Log_copies}/mL

## 2017-12-26 MED ORDER — METRONIDAZOLE 500 MG PO TABS: 500.0000 mg | ORAL_TABLET | Freq: Three times a day (TID) | ORAL | 0 refills | Status: DC

## 2017-12-26 MED ORDER — SULFAMETHOXAZOLE-TRIMETHOPRIM 800-160 MG PO TABS: 1.0000 | ORAL_TABLET | Freq: Every day | ORAL | 3 refills | Status: DC

## 2017-12-26 MED ORDER — METRONIDAZOLE 500 MG PO TABS: 500.0000 mg | ORAL_TABLET | Freq: Three times a day (TID) | ORAL | 0 refills | Status: AC

## 2017-12-26 NOTE — Progress Notes (Signed)
Name: Jacob ColumbiaDesmond J Caruth  DOB: July 29, 1985 MRN: [id number] PCP: Grayce SessionsEdwards, Michelle P, NP   No chief complaint on file.    Patient Active Problem List   Diagnosis Date Noted  . Weakness of both lower extremities   . Protein-calorie malnutrition, severe 12/12/2017  . Prostate abscess   . Personal history of MRSA (methicillin resistant Staphylococcus aureus) 12/09/2017  . History of ESBL E. coli infection 12/09/2017  . Dehydration   . Shigella infection   . Diarrhea 03/05/2017  . AIDS (acquired immune deficiency syndrome) (HCC)   . Proctitis   . Weight loss   . Homelessness   . 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 Syphilis contact, treated 01/02/2014  . Tobacco use disorder 01/02/2014  . Human immunodeficiency virus (HIV) disease (HCC) 01/01/2014     Subjective:  Jacob Gutierrez is here today for hospital follow up care. [facility] 5/18 - 5/24. Presented to ED with difficulty swallowing, vomiting, difficulty breathing that was worsening over the course of one week. Noted to have fevers and generalized abdominal pain. In ED he was found to have oral thrush and prostate abscess 3.6 x 4.2 x 7.2 cm prostatic abscess, fever to 102 deg, leukopenic (1.5k). He was taken urgently to OR on 12/09/17 for drainage of prostatic abscess/TURP. His antibiotics were narowed to vancomycin and metronidazole with GPCs in pairs on gram stain. Received dalbavancin 12/14/17. Also treated with fluconazole for esophageal candidiasis. Underwent work up for weakness as well - MRI of brain was unremarkable. Serum cryptococcal antigen was negative, RPR low titer 1:2 that was consistent with previous level. LP was obtained and also noted to be normal.   For his HIV care he was started on Biktarvy and Bactrim for OI prophylaxis. Viral Load during hospitalization 563,000 copies with CD4 < 10.   He is POD #17 now from TURP - he has been having pain in the perineal area. Hurts to get up to go to  the bathroom due to a "heaviness" in the perineum; he also still is passing some clots in urine. Describes that he has dark urine and is experiencing some incontinence and dysuria. Describes that the pain is getting worse - he noticed the pain has been bad since walking a lot at [employer].   Jacob Gutierrez has resolved and he has finished the fluconazole.   Review of Systems  All other systems reviewed and are negative.   Past Medical History:  Diagnosis Date  . 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 H/O coccidioidomycosis    pulmonary   . HIV disease (HCC)   . Past history of allergy to penicillin-type antibiotic 07-03-2012   desensitization  . PNA (pneumonia)   . Shigella gastroenteritis   . Syphilis    history /treated     Outpatient Medications Prior to Visit  Medication Sig Dispense Refill  . acetaminophen (TYLENOL) 500 MG tablet Take 500-1,000 mg by mouth every 6 (six) hours as needed (for pain, fever, or headache).    . bictegravir-emtricitabine-tenofovir AF (BIKTARVY) 50-200-25 MG TABS tablet Take 1 tablet by mouth daily. 30 tablet 0  . bictegravir-emtricitabine-tenofovir AF (BIKTARVY) 50-200-25 MG TABS tablet Take 1 tablet by mouth daily. 30 tablet 5  . famotidine (PEPCID AC) 10 MG chewable tablet Chew 10 mg by mouth 2 (two) times daily as needed for heartburn.    Marland Kitchen. oxybutynin (DITROPAN) 5 MG tablet Take 1 tablet (5 mg total) by mouth 3 (  three) times daily as needed for bladder spasms. 90 tablet 0  . metroNIDAZOLE (FLAGYL) 500 MG tablet Take 1 tablet (500 mg total) by mouth every 8 (eight) hours. 120 tablet 0  . sulfamethoxazole-trimethoprim (BACTRIM DS,SEPTRA DS) 800-160 MG tablet Take 1 tablet by mouth daily. 30 tablet 3  . fluconazole (DIFLUCAN) 200 MG tablet Take 1 tablet (200 mg total) by mouth at bedtime. (Patient not taking: Reported on 12/26/2017) 7 tablet 0   No facility-administered medications prior to visit.      Allergies    Allergen Reactions  . Amoxicillin Other (See Comments)    From childhood: "I had a reaction when i was little." (??)    Social History   Tobacco Use  . Smoking status: Current Every Day Smoker    Packs/day: 0.50    Types: Cigarettes  . Smokeless tobacco: Never Used  . Tobacco comment: cutting back  Substance Use Topics  . Alcohol use: Yes    Alcohol/week: 0.5 oz    Types: 1 Standard drinks or equivalent per week    Comment: whiskey occasionally   . Drug use: Yes    Frequency: 1.0 times per week    Types: Marijuana    Family History  Problem Relation Age of Onset  . Hypertension Mother   . Lupus Maternal Grandmother   . Cancer Paternal Grandfather     Social History   Substance and Sexual Activity  Sexual Activity Yes  . Partners: Female, Male  . Birth control/protection: Condom     Objective:   Vitals:   12/26/17 1401  BP: (!) 132/91  Pulse: (!) 116  Temp: 98.1 F (36.7 C)  TempSrc: Oral  Weight: 169 lb (76.7 kg)  Height: 6\' 1"  (1.854 m)   Body mass index is 22.3 kg/m.  Physical Exam  Constitutional: He is oriented to person, place, and time. He appears well-developed and well-nourished.  Seated comfortably in chair during visit.   HENT:  Mouth/Throat: Oropharynx is clear and moist and mucous membranes are normal. Normal dentition. No dental abscesses.  Cardiovascular: Regular rhythm and normal heart sounds. Tachycardia present.  Pulmonary/Chest: Effort normal and breath sounds normal. No respiratory distress.  Abdominal: Soft. He exhibits no distension. There is no tenderness.  Genitourinary:  Genitourinary Comments: Deferred digital rectal exam.   Musculoskeletal: He exhibits no edema.  Walking with a limp d/t pain   Lymphadenopathy:    He has no cervical adenopathy.  Neurological: He is alert and oriented to person, place, and time.  Skin: Skin is warm and dry. No rash noted.  Psychiatric: He has a normal mood and affect. Judgment normal.   In good spirits today and engaged in care discussion.     Lab Results Lab Results  Component Value Date   WBC 2.1 (L) 12/15/2017   HGB 10.9 (L) 12/15/2017   HCT 32.4 (L) 12/15/2017   MCV 90.3 12/15/2017   PLT 189 12/15/2017    Lab Results  Component Value Date   CREATININE 0.66 12/15/2017   BUN 18 12/15/2017   NA 138 12/15/2017   K 3.3 (L) 12/15/2017   CL 104 12/15/2017   CO2 25 12/15/2017    Lab Results  Component Value Date   ALT 23 03/05/2017   AST 23 03/05/2017   ALKPHOS 72 03/05/2017   BILITOT 0.5 03/05/2017    Lab Results  Component Value Date   CHOL 126 12/17/2013   HDL 47 12/17/2013   LDLCALC 49 12/17/2013   TRIG 152 (  H) 12/17/2013   CHOLHDL 2.7 12/17/2013   HIV 1 RNA Quant (copies/mL)  Date Value  12/17/2013 1,058,016 (H)   CD4 T Cell Abs (/uL)  Date Value  12/17/2013 200 (L)   Assessment & Plan:   Problem List Items Addressed This Visit      Digestive   RESOLVED: Oral candida    Resolved after course of fluconazole and resuming HAART.       Relevant Medications   sulfamethoxazole-trimethoprim (BACTRIM DS,SEPTRA DS) 800-160 MG tablet   metroNIDAZOLE (FLAGYL) 500 MG tablet   RESOLVED: Esophagitis     Genitourinary   Prostate abscess    Still with pain and ongoing dysuria - I am not certain if he just "over did it" to provoke the pain/symptoms or if there is potential for re-collection of abscess. I have placed a call to Dr. Marlou Porch requesting call back to discuss findings as he has not yet had follow up with him. May need to repeat CT scan to re-evaluate   I asked him to please increase his metronidazole to 500 mg TID for effective dosing. Considering his history of MDRO will continue treatment for 29 more days with aforementioned metronidazole and I will add omadacycline 450 mg x 2 days then 300 mg QD. This will complete 6 week course.   He will also need follow up with urology team the week of June 24th (hoepfully this can be arranged  sooner).         Other   AIDS (acquired immune deficiency syndrome) (HCC) - Primary    His HMAP has been emergently processed and now active. Continue Biktarvy and Bactrim once daily.  Return in 4 weeks to follow up with Dr. Daiva Eves.       Relevant Medications   sulfamethoxazole-trimethoprim (BACTRIM DS,SEPTRA DS) 800-160 MG tablet   metroNIDAZOLE (FLAGYL) 500 MG tablet      ADDENDUM 12/27/2017: Omadacycline was denied by patient assistance program. Discussed with ID pharmacy team - linezolid is not a great option for prostate penetration and he is already thrombocytopenic; will treat with Levaquin + Flagyl for the duration. Will pick up medication from pharmacy and arrange for transportation to our clinic for pick up. I spoke with Dr. Sherron Monday on call with [facility] as Dr. Marlou Porch was unavailable. It is unlikely that the abscess will re-accumulate.  Recommended to have him seen in the office this week to consider if he needs follow up scan. Will call Mitch to discuss possibility to arrange transportation to this follow up appointment or if his father can assist.   Rexene Alberts, MSN, NP-C [facility] Pager: [phone] Office: [phone]  12/27/17  5:27 PM

## 2017-12-26 NOTE — Progress Notes (Addendum)
Faxed in patient assistance form for omadacycline Riki Altes(Nuzyra) to see if we can get patient approved for antibiotic x 30 days. Will keep MalcomStephanie posted. ----------------------------------------------------------------------  Addendum: Patient was denied omadacycline access due to indication being off label. Spoke with Judeth CornfieldStephanie and she will do Levaquin + Flagyl for now.

## 2017-12-26 NOTE — Patient Instructions (Addendum)
Increase your flagyl to three times a day   OK to take Benadryl for itching per instructions on the box - the itching is likely due to dry skin but can also be due to HIV infection.   Dove soap, warm not hot showers, lotion right away after you get out. Would do Aveeno, Cetaphil, Eucerin cream and even Vaseline on top of the lotion. Several times a day.   Biotene mouth rinse and gel to help with the dry mouth - this is likely due to the Oxybutin medication for your bladder spasms.   Will call and discuss with the urology team.   Continue taking your Bactrim once daily until we tell you to stop.  Continue taking your Biktarvy once daily.

## 2017-12-26 NOTE — Assessment & Plan Note (Addendum)
His HMAP has been emergently processed and now active. Continue Biktarvy and Bactrim once daily.  Return in 4 weeks to follow up with Dr. Daiva EvesVan Dam.

## 2017-12-26 NOTE — Assessment & Plan Note (Addendum)
Still with pain and ongoing dysuria - I am not certain if he just "over did it" to provoke the pain/symptoms or if there is potential for re-collection of abscess. I have placed a call to Dr. Marlou PorchHerrick requesting call back to discuss findings as he has not yet had follow up with him. May need to repeat CT scan to re-evaluate   I asked him to please increase his metronidazole to 500 mg TID for effective dosing. Considering his history of MDRO will continue treatment for 29 more days with aforementioned metronidazole and I will add omadacycline 450 mg x 2 days then 300 mg QD. This will complete 6 week course.   He will also need follow up with urology team the week of June 24th (hoepfully this can be arranged sooner).

## 2017-12-27 MED ORDER — LEVOFLOXACIN 500 MG PO TABS: 500.0000 mg | ORAL_TABLET | Freq: Every day | ORAL | 0 refills | Status: DC

## 2017-12-27 NOTE — Assessment & Plan Note (Signed)
Resolved after course of fluconazole and resuming HAART.

## 2017-12-28 ENCOUNTER — Telehealth: Payer: Self-pay

## 2017-12-28 NOTE — Telephone Encounter (Addendum)
[facility] called to state patient has an appointment today 12/28/2017 at 1:45.  Per [facility] the patient is aware of the appointment if he can get transportation  [place] and made sure he was aware of his appointment at [facility] today at 1:45.  Patient states he was aware of the appointment but is trying to find transportation.  He states his father may be able to take him.  Also made him aware that he has a antibiotic prescription for [facility]aquin to fill at [facility] on [address].  Patient verbalized understanding.  Patient knows to call the office back if he is unable to get transportation to the pharmacy to pick up the Levaquin. Angeline SlimAshley Hill RN

## 2017-12-28 NOTE — Telephone Encounter (Signed)
Thank you so much for you help!

## 2017-12-28 NOTE — Telephone Encounter (Signed)
Called Urology per Rexene AlbertsStephanie Dixon, NP to have pt f/u with Dr. Marlou PorchHerrick. Spoke with Vernona RiegerLaura, RN in triage. RN stated due to pt's insurance she would have to call pt today to get appt set up. Pt is complaining of pain, clots in urine.  RN stated she would call our office back once appt was made.   Judeth CornfieldStephanie would also like for pt to get a call about picking [facility] an antibiotic [facility]. Will ask pt if his dad can take him to his appt and to pharmacy same day. Lorenso CourierJose L Amour Trigg, [place]

## 2017-12-28 NOTE — Telephone Encounter (Signed)
Called patient per Rexene AlbertsStephanie Dixon NP, to see if he has transportation to the pharmacy today to pick up his Levaquin.  Patient states his father will be able to take him to pick up the prescription today.  Patient states he called [facility] and had to reschedule his appointment due to lack of transportation.  He states their next available appointment was 01/18/2018 and he states he took that appointment and will call to see if they have any cancellations. Angeline SlimAshley Hill RN

## 2017-12-29 ENCOUNTER — Telehealth: Payer: Self-pay | Admitting: Infectious Diseases

## 2017-12-29 DIAGNOSIS — N412 Abscess of prostate: Secondary | ICD-10-CM

## 2017-12-29 NOTE — Telephone Encounter (Signed)
Spoke with Dr. Marlou PorchHerrick and agrees he needs repeat CT scan of the pelvis to r/o re-cumulation based on how large the abscess was and considering his immune system. Also requested a U/A with culture.   Will try to get [name] transportation to CT scan next week and possibly an earlier appointment with urology. Transportation is his big limiting factor that is making coordinating his care fairly complicated. Thankfully he is working with our Veterinary surgeonbridge counselor Mitch for assistance.

## 2017-12-29 NOTE — Telephone Encounter (Signed)
Thanks so much Stephanie!

## 2018-01-03 NOTE — Telephone Encounter (Signed)
Any update as to when we can get his CT scan done?

## 2018-01-03 NOTE — Telephone Encounter (Signed)
Jacob Gutierrez has scheduled the appt for 01/10/18 at 3. Marthann SchillerMitch is aware of the appt.

## 2018-01-04 LAB — FUNGUS CULTURE WITH STAIN

## 2018-01-04 LAB — FUNGAL ORGANISM REFLEX

## 2018-01-04 LAB — FUNGUS CULTURE RESULT

## 2018-01-10 ENCOUNTER — Ambulatory Visit (HOSPITAL_COMMUNITY)
Admission: RE | Admit: 2018-01-10 | Discharge: 2018-01-10 | Disposition: A | Discharge status: Home / Self Care | Payer: Medicaid Other | Source: Ambulatory Visit | Attending: Infectious Diseases | Admitting: Infectious Diseases

## 2018-01-10 DIAGNOSIS — N412 Abscess of prostate: Secondary | ICD-10-CM | POA: Diagnosis not present

## 2018-01-10 MED ORDER — IOHEXOL 300 MG/ML  SOLN
100.0000 mL | Freq: Once | INTRAMUSCULAR | Status: AC | PRN
  Administered 2018-01-10: 100 mL via INTRAVENOUS

## 2018-01-15 ENCOUNTER — Telehealth: Payer: Self-pay | Admitting: Infectious Diseases

## 2018-01-15 NOTE — Telephone Encounter (Signed)
Attempted to call patient with results of CT scan - only rings as busy.   He still has some left over fluid collection we can see. I have updated his urologist and he will discuss with him next steps. Will continue Levaquin + TID Metronidazole as planned through early July - may require longer course considering his poor immune system (CD4 < 10).   Rexene AlbertsStephanie Dixon, NP

## 2018-01-18 ENCOUNTER — Telehealth: Payer: Self-pay | Admitting: Infectious Diseases

## 2018-01-18 MED ORDER — SULFAMETHOXAZOLE-TRIMETHOPRIM 800-160 MG PO TABS: 1.0000 | ORAL_TABLET | Freq: Every day | ORAL | 3 refills | Status: DC

## 2018-01-18 MED ORDER — FLUCONAZOLE 200 MG PO TABS: 200.0000 mg | ORAL_TABLET | Freq: Every day | ORAL | 0 refills | Status: DC

## 2018-01-18 NOTE — Telephone Encounter (Signed)
Notified from [place]Mitch that Jacob Gutierrez needs refill on Diflucan as he has thrush - will send in 7d supply with no refills.   It appears he has refills on the Bactrim but will re-order.   I asked him to please call his pharmacy for refills of existing medications.

## 2018-01-22 LAB — ACID FAST CULTURE WITH REFLEXED SENSITIVITIES: ACID FAST CULTURE - AFSCU3: NEGATIVE

## 2018-01-23 NOTE — Discharge Summary (Signed)
Physician Discharge Summary  Jacob Gutierrez ZOX:[id number] DOB: 1986-05-24 DOA: 12/09/2017  PCP: Grayce Sessions, NP  Admit date: 12/09/2017 Discharge date: 01/23/2018  Time spent: 35 minutes  Recommendations for Outpatient Follow-up:  1. Hospital follow up has been arranged by Dr. Daiva Eves and [facility]   Discharge Diagnoses:  Active Problems:   AIDS (acquired immune deficiency syndrome) (HCC)   Personal history of MRSA (methicillin resistant Staphylococcus aureus)   History of ESBL E. coli infection   Prostate abscess   Protein-calorie malnutrition, severe   Weakness of both lower extremities   Discharge Condition: improved  Diet recommendation: reg  Filed Weights   12/09/17 1810  Weight: 77.1 kg (170 lb)    History of present illness:  39 bisexual Male HIV/AIDS, last CD4 count was 10, has been off of HAART for the past year,  Coccidiomycosis,  gastroenteritis due to Cryptosporidium,  ESBL UTI August 2018,  MRSA,  abscess December 2018,  treated syphilis [ desensitixation c Penicillin],  recent right groin abscess I&D then treated with antibiotics which he did not complete, numerous other infectious issues, presented to ED 5.19 with odynophagia   coffee-ground emesis, dypnsoea, nonproductive cough, fever 102 F, abdominal pain admitted for sepsis due to prostatic abscess.   Urology performed TURP 5/18.   ID consulted.   CCM consulted for hypotension and concern for impending shock, signed off 5/20.   Foley catheter discontinued 5/21.   Clinically improved.  Transferred to medical bed 5/21.       Hospital Course:  Severe sepsis: 2/2 prostatic abscess.  Status post TURP by urology on 5/18.  started on IV vancomycin and meropenem.   Abscess Gram stain shows gram-positive cocci in pairs, culture negative to date.  MRSA PCR negative.   No blood cultures were drawn on admission, drawn 5/19 are negative to date.  Urine culture negative.   Abscess AFB smear negative.   AFB & fungal culture pending.   5/19, febrile up to 103 F.  Treated 6 L crystalloids, lactate 1.6 > 2.4, stress dose steroids started, did not require pressors.  Sepsis features resolved.  CCM consulted and signed off.  ID follow-up appreciated.  Patient is growing out Prevotella BIvia--need 6 weeks of Flagyl vs direction as per ID Weaning stress dose steroids.  MRI of the brain as well as MRI of the lower spine 5/22 negative for occult infection  Prostate abscess:  CT abdomen confirmed prostate abscess.  Urology consulted and patient underwent cystoscopy, TURP on 5/18.    Discussed with Dr. Marlou Porch >Foley catheter discontinued 5/21, mild hematuria after removal as expected, outpatient follow-up with him in 6 weeks.   Appreciate ID input regarding antibiotic coverage-likely 2/2 enterococcus and PRevotella--dosed Dalbavancin 5/23 Home with 6 weeks flagyll  Oral and esophageal candidiasis: Continue fluconazole and Magic mouthwash.  Improving--stop date fluconazole 5/30  Hypokalemia/hypomagnesemia: Replace and follow.  Pancytopenia: Anemia and leukopenia appear chronic.  Current worsening may be related to sepsis and HIV/AIDS.  No overt bleeding reported.  Follow CBCs daily.  no transfusion this admit  Nausea and vomiting:?  Coffee-ground emesis early on but presently only dry heaving and no coffee grounds.  IV Pepcid added.Resolved.  HIV/AIDS: Noncompliant with HAART.  ID started patient on Biktarvy.  Absolute CD4 count <10.  HIV 1 RNA PCR pending. Infectious disease recommends Bactrim prophylaxis, providing PCP prevention  Severe malnutrition/Body mass index is 22.43 kg/m.  Dietitian consulted.  Hemoptysis: Not sure if he had this.  None since admission.  No cavitary lesions on chest x-ray.  Monitor.  Resolved.  Asymptomatic sinus bradycardia: Noted on telemetry.  TSH 0.684.  Hyponatremia: Clinically euvolemic.?  SIADH related to HIV/AIDS.  TSH normal.   Stable.    Discharge Exam: Vitals:   12/15/17 0520 12/15/17 1428  BP: 127/90 (!) 129/95  Pulse: (!) 54 72  Resp: 18   Temp: 98.3 F (36.8 C)   SpO2: 100% 100%    Awake alert pleasant ambulatory independantly in nad cta b s1 s 2no m/r/g abd soft nt nd no rebound No le edema Groin exam deferred  Discharge Instructions   Discharge Instructions    Diet - low sodium heart healthy   Complete by:  As directed    Discharge instructions   Complete by:  As directed    Take all of your medications as indicated Follow up with your Infectious disease Md Follow up to get your Meds covered I would strongly encourage close OP follow up and will cc Dr. Daiva Eves   Increase activity slowly   Complete by:  As directed      Allergies as of 12/15/2017      Reactions   Amoxicillin Other (See Comments)   From childhood: "I had a reaction when i was little." (??)      Medication List    STOP taking these medications   sulfamethoxazole-trimethoprim 800-160 MG tablet Commonly known as:  BACTRIM DS,SEPTRA DS     TAKE these medications   acetaminophen 500 MG tablet Commonly known as:  TYLENOL Take 500-1,000 mg by mouth every 6 (six) hours as needed (for pain, fever, or headache).   bictegravir-emtricitabine-tenofovir AF 50-200-25 MG Tabs tablet Commonly known as:  BIKTARVY Take 1 tablet by mouth daily.   bictegravir-emtricitabine-tenofovir AF 50-200-25 MG Tabs tablet Commonly known as:  BIKTARVY Take 1 tablet by mouth daily.   famotidine 10 MG chewable tablet Commonly known as:  PEPCID AC Chew 10 mg by mouth 2 (two) times daily as needed for heartburn.   oxybutynin 5 MG tablet Commonly known as:  DITROPAN Take 1 tablet (5 mg total) by mouth 3 (three) times daily as needed for bladder spasms.      Allergies  Allergen Reactions  . Amoxicillin Other (See Comments)    From childhood: "I had a reaction when i was little." (??)      The results of significant diagnostics  from this hospitalization (including imaging, microbiology, ancillary and laboratory) are listed below for reference.    Significant Diagnostic Studies: Ct Pelvis W Contrast  Result Date: 01/11/2018 CLINICAL DATA:  Status post TURP.  Continued pelvic pressure. EXAM: CT PELVIS WITH CONTRAST TECHNIQUE: Multidetector CT imaging of the pelvis was performed using the standard protocol following the bolus administration of intravenous contrast. CONTRAST:  OMNIPAQUE IOHEXOL 300 MG/ML  SOLN COMPARISON:  12/09/2017 FINDINGS: Urinary Tract: Bladder is nondistended. There is some diffuse posterior bladder wall thickening. High density curvilinear material is identified in the region of the urethral orifice, likely related to recent procedure. In the posterior penis, caudal to the apex of the prostate gland, a 1.7 x 2.7 x 1.5 cm fluid collection is identified. This corresponds to the inferior extent of the abscess seen on the previous study and is likely just posterior to the urethra. Bowel:  Unremarkable. Vascular/Lymphatic: Visualized lower abdominal aorta is without aneurysm. Retroperitoneal lymphadenopathy is stable. Reproductive:  Unremarkable given history of recent TURP. Other:  No intraperitoneal free fluid. Musculoskeletal: Insert bone windows IMPRESSION: 1. Residual  fluid collection measuring 2 x 3 x 2 cm identified in the posterior penis, corresponding to the inferior most extent of the larger more complex abscess seen on the prior study. Electronically Signed   By: Kennith CenterEric  Mansell M.D.   On: 01/11/2018 08:00    Microbiology: No results found for this or any previous visit (from the past 240 hour(s)).   Labs: Basic Metabolic Panel: No results for input(s): NA, K, CL, CO2, GLUCOSE, BUN, CREATININE, CALCIUM, MG, PHOS in the last 168 hours. Liver Function Tests: No results for input(s): AST, ALT, ALKPHOS, BILITOT, PROT, ALBUMIN in the last 168 hours. No results for input(s): LIPASE, AMYLASE in the  last 168 hours. No results for input(s): AMMONIA in the last 168 hours. CBC: No results for input(s): WBC, NEUTROABS, HGB, HCT, MCV, PLT in the last 168 hours. Cardiac Enzymes: No results for input(s): CKTOTAL, CKMB, CKMBINDEX, TROPONINI in the last 168 hours. BNP: BNP (last 3 results) No results for input(s): BNP in the last 8760 hours.  ProBNP (last 3 results) No results for input(s): PROBNP in the last 8760 hours.  CBG: No results for input(s): GLUCAP in the last 168 hours.     Signed:  Rhetta MuraJai-Gurmukh Trayvond Viets MD   [facility] 01/23/2018, 10:10 AM

## 2018-01-24 ENCOUNTER — Ambulatory Visit: Payer: No Typology Code available for payment source | Admitting: Infectious Diseases

## 2018-02-02 ENCOUNTER — Encounter: Payer: Self-pay | Admitting: Infectious Disease

## 2018-02-02 ENCOUNTER — Other Ambulatory Visit: Payer: Self-pay

## 2018-02-02 ENCOUNTER — Ambulatory Visit (INDEPENDENT_AMBULATORY_CARE_PROVIDER_SITE_OTHER): Payer: Self-pay | Admitting: Infectious Disease

## 2018-02-02 ENCOUNTER — Ambulatory Visit: Payer: Self-pay

## 2018-02-02 VITALS — BP 125/85 | HR 76 | Temp 98.4°F | Wt 182.0 lb

## 2018-02-02 DIAGNOSIS — E43 Unspecified severe protein-calorie malnutrition: Secondary | ICD-10-CM

## 2018-02-02 DIAGNOSIS — N412 Abscess of prostate: Secondary | ICD-10-CM

## 2018-02-02 DIAGNOSIS — B20 Human immunodeficiency virus [HIV] disease: Secondary | ICD-10-CM

## 2018-02-02 DIAGNOSIS — Z23 Encounter for immunization: Secondary | ICD-10-CM

## 2018-02-02 LAB — T-HELPER CELL (CD4) - (RCID CLINIC ONLY)
CD4 T CELL ABS: 60 /uL — AB (ref 400–2700)
CD4 T CELL HELPER: 5 % — AB (ref 33–55)

## 2018-02-02 MED ORDER — SULFAMETHOXAZOLE-TRIMETHOPRIM 800-160 MG PO TABS: 1.0000 | ORAL_TABLET | Freq: Every day | ORAL | 3 refills | Status: DC

## 2018-02-02 MED ORDER — LEVOFLOXACIN 500 MG PO TABS: 500.0000 mg | ORAL_TABLET | Freq: Every day | ORAL | 0 refills | Status: DC

## 2018-02-02 MED ORDER — BICTEGRAVIR-EMTRICITAB-TENOFOV 50-200-25 MG PO TABS: 1.0000 | ORAL_TABLET | Freq: Every day | ORAL | 5 refills | Status: DC

## 2018-02-02 NOTE — Patient Instructions (Signed)
Ok to book with Judeth CornfieldStephanie if not able to with me

## 2018-02-02 NOTE — Addendum Note (Signed)
Addended by: Gildardo GriffesHILL, Raina Sole M on: 02/02/2018 12:03 PM   Modules accepted: Orders

## 2018-02-02 NOTE — Progress Notes (Signed)
Subjective:   Chief complaint: He is noticed that when he tries to ejaculate he has a sensation that he is but there is no ejaculate that comes out of his dependent penis.   Patient ID: Jacob Gutierrez, male    DOB: [birth year], 32 y.o.   MRN: [id number]  HPI  32 y.o. male with HIV and AIDS is not been compliant with his antiretroviral regimen admitted now with prostate abscess status post transurethral resection of the prostate with cultures having been sent with Gram stain showing moderate gram-positive cocci in pairs but cultures so far not yielding an organism.   [facility] ended [facility].  He then followed up with Judeth CornfieldStephanie in the clinic and she endeavor to get him omadacycline  + metronidazole but we ended up going to levaquin and metronidazole.  Pete CT scan still did show an abscess on the  In the posterior penis, caudal to the apex of the prostate gland, a 1.7 x 2.7 x 1.5 cm fluid   His Levaquin and metronidazole were continued since then.  He claims he still has them.  He also claims he has been taking his BIKTARVY.  He does not have much in way of pain but as mentioned he has noticed that when he has an an orgasm he does not have any ejaculate come from his penis.  No longer being followed by urology.  Review of Systems  Constitutional: Negative for chills and fever.  HENT: Negative for congestion and sore throat.   Eyes: Negative for photophobia.  Respiratory: Negative for cough, shortness of breath and wheezing.   Cardiovascular: Negative for chest pain, palpitations and leg swelling.  Gastrointestinal: Negative for abdominal pain, blood in stool, constipation, diarrhea, nausea and vomiting.  Genitourinary: Negative for dysuria, flank pain and hematuria.  Musculoskeletal: Negative for back pain and myalgias.  Skin: Negative for rash.  Neurological: Negative for dizziness, weakness and headaches.  Hematological: Does not bruise/bleed easily.    Psychiatric/Behavioral: Negative for agitation, behavioral problems, decreased concentration and suicidal ideas.       Objective:   Physical Exam  Constitutional: He is oriented to person, place, and time. He appears well-developed and well-nourished. He is cooperative. He does not appear ill. No distress.  HENT:  Head: Normocephalic and atraumatic.  Right Ear: Hearing and external ear normal.  Left Ear: Hearing and external ear normal.  Nose: No rhinorrhea or nasal deformity. No epistaxis.  Eyes: Pupils are equal, round, and reactive to light. Conjunctivae and EOM are normal. Right conjunctiva is not injected. Left conjunctiva is not injected. No scleral icterus.  Neck: Normal range of motion. Neck supple. No JVD present.  Cardiovascular: Normal rate, regular rhythm, S1 normal and S2 normal.  Pulmonary/Chest: Effort normal. No respiratory distress. He has no wheezes.  Abdominal: Soft. Normal appearance and bowel sounds are normal. He exhibits no ascites. There is no hepatosplenomegaly.  Genitourinary:     Musculoskeletal: Normal range of motion.       Right shoulder: Normal.       Left shoulder: Normal.       Right hip: Normal.       Left hip: Normal.       Right knee: Normal.       Left knee: Normal.  Lymphadenopathy:       Head (right side): No submandibular, no preauricular and no posterior auricular adenopathy present.       Head (left side): No submandibular, no  preauricular and no posterior auricular adenopathy present.    He has no cervical adenopathy.       Right cervical: No superficial cervical and no deep cervical adenopathy present.      Left cervical: No superficial cervical and no deep cervical adenopathy present.  Neurological: He is alert and oriented to person, place, and time. He has normal strength. No sensory deficit. Coordination and gait normal.  Skin: Skin is warm, dry and intact. No abrasion, no bruising, no ecchymosis, no lesion and no rash noted. He is  not diaphoretic. No cyanosis or erythema. No pallor. Nails show no clubbing.  Psychiatric: He has a normal mood and affect. His speech is normal and behavior is normal. Judgment and thought content normal. Cognition and memory are normal. He is attentive.          Assessment & Plan:   #1 prostate abscess: I am going to continue his Levaquin for another month and see how his symptoms related to inability to have an ejaculate go.  If these are still persisting consider reimaging with CT scan and certainly consider having him to be seen again by urology.  2.  HIV and AIDS: Check viral load and CD4 count today continue BIKTARVY and Bactrim and renew HMA P vaccinate for pneumonia with Prevnar and also meningococcal vaccine.

## 2018-02-05 LAB — CBC WITH DIFFERENTIAL/PLATELET
Basophils Absolute: 29 cells/uL (ref 0–200)
Basophils Relative: 1 %
EOS PCT: 14.8 %
Eosinophils Absolute: 429 cells/uL (ref 15–500)
HEMATOCRIT: 38.5 % (ref 38.5–50.0)
Hemoglobin: 13.7 g/dL (ref 13.2–17.1)
LYMPHS ABS: 1090 {cells}/uL (ref 850–3900)
MCH: 33 pg (ref 27.0–33.0)
MCHC: 35.6 g/dL (ref 32.0–36.0)
MCV: 92.8 fL (ref 80.0–100.0)
MONOS PCT: 18.6 %
MPV: 9.1 fL (ref 7.5–12.5)
NEUTROS PCT: 28 %
Neutro Abs: 812 cells/uL — ABNORMAL LOW (ref 1500–7800)
Platelets: 223 10*3/uL (ref 140–400)
RBC: 4.15 10*6/uL — ABNORMAL LOW (ref 4.20–5.80)
RDW: 15.7 % — AB (ref 11.0–15.0)
Total Lymphocyte: 37.6 %
WBC mixed population: 539 cells/uL (ref 200–950)
WBC: 2.9 10*3/uL — ABNORMAL LOW (ref 3.8–10.8)

## 2018-02-05 LAB — COMPLETE METABOLIC PANEL WITH GFR
AG RATIO: 1.1 (calc) (ref 1.0–2.5)
ALKALINE PHOSPHATASE (APISO): 105 U/L (ref 40–115)
ALT: 23 U/L (ref 9–46)
AST: 31 U/L (ref 10–40)
Albumin: 4 g/dL (ref 3.6–5.1)
BILIRUBIN TOTAL: 0.3 mg/dL (ref 0.2–1.2)
BUN: 14 mg/dL (ref 7–25)
CALCIUM: 9.1 mg/dL (ref 8.6–10.3)
CHLORIDE: 103 mmol/L (ref 98–110)
CO2: 27 mmol/L (ref 20–32)
Creat: 0.98 mg/dL (ref 0.60–1.35)
GFR, EST AFRICAN AMERICAN: 116 mL/min/{1.73_m2} (ref >=60)
GFR, Est Non African American: 100 mL/min/{1.73_m2} (ref >=60)
Globulin: 3.6 g/dL (calc) (ref 1.9–3.7)
Glucose, Bld: 85 mg/dL (ref 65–99)
POTASSIUM: 4.3 mmol/L (ref 3.5–5.3)
SODIUM: 137 mmol/L (ref 135–146)
Total Protein: 7.6 g/dL (ref 6.1–8.1)

## 2018-02-05 LAB — RPR TITER

## 2018-02-05 LAB — RPR: RPR Ser Ql: REACTIVE — AB

## 2018-02-05 LAB — FLUORESCENT TREPONEMAL AB(FTA)-IGG-BLD: Fluorescent Treponemal ABS: REACTIVE — AB

## 2018-02-08 ENCOUNTER — Telehealth: Payer: Self-pay | Admitting: Behavioral Health

## 2018-02-08 NOTE — Telephone Encounter (Signed)
-----   Message from Randall Hissornelius N Van Dam, MD sent at 02/08/2018  9:55 AM EDT ----- Patients VL is not down where it should be. He needs to come in again in next 2-4 weeks to see NP or ID pharmacy and have labs rechecked. He needs to tighten up his adherence

## 2018-02-08 NOTE — Telephone Encounter (Signed)
Called patient, Did not leave a voicemail because the correct name was not on the voicemail.  Will continue to try to call back. Angeline SlimAshley Hill RN

## 2018-02-09 ENCOUNTER — Encounter: Payer: Self-pay | Admitting: Infectious Diseases

## 2018-02-14 LAB — HIV RNA, RTPCR W/R GT (RTI, PI,INT)
HIV 1 RNA QUANT: 4690 {copies}/mL — AB
HIV-1 RNA QUANT, LOG: 3.67 {Log_copies}/mL — AB

## 2018-02-14 LAB — HIV-1 INTEGRASE GENOTYPE

## 2018-02-14 LAB — HIV-1 GENOTYPE: HIV-1 GENOTYPE: DETECTED — AB

## 2018-03-13 ENCOUNTER — Ambulatory Visit: Payer: No Typology Code available for payment source | Admitting: Infectious Disease

## 2018-04-10 ENCOUNTER — Ambulatory Visit: Payer: No Typology Code available for payment source | Admitting: Infectious Disease

## 2018-04-24 ENCOUNTER — Ambulatory Visit (INDEPENDENT_AMBULATORY_CARE_PROVIDER_SITE_OTHER): Payer: No Typology Code available for payment source | Admitting: Family

## 2018-04-24 ENCOUNTER — Encounter: Payer: Self-pay | Admitting: Family

## 2018-04-24 VITALS — BP 124/76 | HR 68 | Temp 98.0°F | Wt 192.0 lb

## 2018-04-24 DIAGNOSIS — A630 Anogenital (venereal) warts: Secondary | ICD-10-CM

## 2018-04-24 DIAGNOSIS — B20 Human immunodeficiency virus [HIV] disease: Secondary | ICD-10-CM

## 2018-04-24 DIAGNOSIS — N412 Abscess of prostate: Secondary | ICD-10-CM

## 2018-04-24 DIAGNOSIS — Z23 Encounter for immunization: Secondary | ICD-10-CM

## 2018-04-24 NOTE — Assessment & Plan Note (Signed)
Jacob Gutierrez indicates that he has taken the antibiotics as prescribed and is no longer having symptoms. Will continue to monitor as needed.

## 2018-04-24 NOTE — Patient Instructions (Signed)
Nice to meet you.   Please continue to take your Biktarvy and Bactrim.  We will check your blood work today.  A referral to general surgery will be sent for wart removal.  Plan for office follow up with Dr. Daiva Eves in 1 month or sooner if needed.

## 2018-04-24 NOTE — Assessment & Plan Note (Signed)
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 It appears he make have picked at some. Aldara cream offered, however patient refused and would like to go to general surgery to have them removed. I will place the referral.

## 2018-04-24 NOTE — Progress Notes (Signed)
Subjective:    Patient ID: Jacob Gutierrez, male    DOB: [birth year], 32 y.o.   MRN: [id number]  Chief Complaint  Patient presents with  . HIV Positive/AIDS  . Condyloma     HPI:  Jacob Gutierrez is a 32 y.o. male who presents today for follow-up of his HIV disease and prostate abscess.  Jacob Gutierrez was last seen in the office on 02/02/2018 following hospitalization for a prostate abscess and treated with dalbavancin and on follow-up was treated with Levaquin and metronidazole.  Repeat CT scan did show continued abscess and he was continued on levofloxacin and metronidazole.  His HIV regimen consisted of Biktarvy which he reported taking at the time where he had a viral load of 4690 and CD4 count of 60.  He was continued on Bactrim and Biktarvy with instructions to follow-up in 1 month or sooner.  Unfortunately he has not been seen since July. He is due for Pneumovax, Influenza and Menveo today.    [name] has been taking his Biktarvy and Bactrim as prescribed with no adverse side effects.  He has missed 1-2 doses since his most recent appointment.  Continues to have coverage through [facility] and receives his medication from [facility] with no complications.  He has been having the symptoms of warts located in his groin and rectal area that have been going on for a couple of years.  Denies any modifying factors or attempted treatments.  He does not think that these have changed in size, however has become uncomfortable at times.  He is not interested in trying medication, and would like a referral to general surgery.  Jacob Gutierrez has taken his metronidazole and levofloxacin as prescribed previously for his prostate abscess with no adverse side effects or diarrhea.  He has no further symptoms at this time and indicates that this is no longer a problem.      Depression screen PHQ 2/9 04/24/2018  Decreased Interest 0  Down, Depressed, Hopeless 0  PHQ - 2 Score 0     Allergies    Allergen Reactions  . Amoxicillin Other (See Comments)    From childhood: "I had a reaction when i was little." (??)      Outpatient Medications Prior to Visit  Medication Sig Dispense Refill  . acetaminophen (TYLENOL) 500 MG tablet Take 500-1,000 mg by mouth every 6 (six) hours as needed (for pain, fever, or headache).    . bictegravir-emtricitabine-tenofovir AF (BIKTARVY) 50-200-25 MG TABS tablet Take 1 tablet by mouth daily. 30 tablet 5  . famotidine (PEPCID AC) 10 MG chewable tablet Chew 10 mg by mouth 2 (two) times daily as needed for heartburn.    . fluconazole (DIFLUCAN) 200 MG tablet Take 1 tablet (200 mg total) by mouth daily. 7 tablet 0  . levofloxacin (LEVAQUIN) 500 MG tablet Take 1 tablet (500 mg total) by mouth daily. 30 tablet 0  . oxybutynin (DITROPAN) 5 MG tablet Take 1 tablet (5 mg total) by mouth 3 (three) times daily as needed for bladder spasms. 90 tablet 0  . sulfamethoxazole-trimethoprim (BACTRIM DS,SEPTRA DS) 800-160 MG tablet Take 1 tablet by mouth daily. 30 tablet 3   No facility-administered medications prior to visit.      Past Medical History:  Diagnosis Date  . 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 H/O coccidioidomycosis    pulmonary   . HIV disease (HCC)   . Past history of allergy to penicillin-type antibiotic  07-03-2012   desensitization  . PNA (pneumonia)   . Shigella gastroenteritis   . Syphilis    history /treated      Past Surgical History:  Procedure Laterality Date  . TRANSURETHRAL RESECTION OF PROSTATE N/A 12/09/2017   Procedure: TRANSURETHRAL RESECTION OF THE PROSTATE (TURP);  Surgeon: Crist Fat, MD;  Location: [facility];  Service: Urology;  Laterality: N/A;       Review of Systems  Constitutional: Negative for appetite change, chills, fatigue, fever and unexpected weight change.  Eyes: Negative for visual disturbance.  Respiratory: Negative for cough, chest tightness, shortness of  breath and wheezing.   Cardiovascular: Negative for chest pain and leg swelling.  Gastrointestinal: Negative for abdominal pain, constipation, diarrhea, nausea and vomiting.  Genitourinary: Negative for dysuria, flank pain, frequency, genital sores, hematuria and urgency.  Skin: Positive for rash.  Allergic/Immunologic: Negative for immunocompromised state.  Neurological: Negative for dizziness and headaches.      Objective:    BP 124/76   Pulse 68   Temp 98 F (36.7 C)   Wt 192 lb (87.1 kg)   BMI 25.33 kg/m  Nursing note and vital signs reviewed.  Physical Exam  Constitutional: He is oriented to person, place, and time. He appears well-developed and well-nourished. No distress.  Cardiovascular: Normal rate, regular rhythm, normal heart sounds and intact distal pulses.  Pulmonary/Chest: Effort normal and breath sounds normal.  Genitourinary:  Genitourinary Comments: Multiple condyloma noted in the bilateral groin worse on left and also in the rectal area.   Neurological: He is alert and oriented to person, place, and time.  Skin: Skin is warm and dry.  Psychiatric: He has a normal mood and affect. His behavior is normal. Judgment and thought content normal.       Assessment & Plan:   Problem List Items Addressed This Visit      Genitourinary   Prostate abscess    [name] indicates that he has taken the antibiotics as prescribed and is no longer having symptoms. Will continue to monitor as needed.         Other   AIDS (acquired immune deficiency syndrome) (HCC) - Primary    [name] indicates he has been compliant with his medication missing 1-2 doses since his last appointment. He has no problems obtaining his medication as he has Medicaid and has been receiving it from [employer]. Our bridge counselor, [name], informed me today that he is currently living in an apartment funded by his mother who will no longer pay for the apartment after this month and there is a  waiting list for other housing. He is currently looking for work as "disability is taking too long." These are areas of concern for continued patient adherence to medication and will continue to assist as able. Certainly appreciate Mitch's assistance in this. Influenza and Pneumovax updated today. Recheck CD4 and viral load today. Continue current Biktarvy and Bactrim. Plan for follow up in 1 month or sooner if needed.       Relevant Orders   T-helper cell (CD4)- ([facility] clinic only)   HIV 1 RNA quant-no reflex-bld   Pneumococcal polysaccharide vaccine 23-valent greater than or equal to 2yo subcutaneous/IM (Completed)   Condyloma    Mr. 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 It appears he make have picked at some. Aldara cream offered, however patient refused and would like to go to general surgery to have them removed. I  will place the referral.       Relevant Orders   Ambulatory referral to General Surgery    Other Visit Diagnoses    Need for 23-polyvalent pneumococcal polysaccharide vaccine       Relevant Orders   Pneumococcal polysaccharide vaccine 23-valent greater than or equal to 2yo subcutaneous/IM (Completed)       I am having Lillie Columbia maintain his acetaminophen, famotidine, oxybutynin, fluconazole, levofloxacin, bictegravir-emtricitabine-tenofovir AF, and sulfamethoxazole-trimethoprim.   Follow-up: Return in about 1 month (around 05/25/2018), or if symptoms worsen or fail to improve.   Marcos Eke, MSN, FNP-C Nurse Practitioner [facility] Office phone: [phone] Pager: [phone] [facility] Main number: [phone]

## 2018-04-24 NOTE — Assessment & Plan Note (Signed)
[name] indicates he has been compliant with his medication missing 1-2 doses since his last appointment. He has no problems obtaining his medication as he has Medicaid and has been receiving it from [employer]. Our bridge counselor, [name], informed me today that he is currently living in an apartment funded by his mother who will no longer pay for the apartment after this month and there is a waiting list for other housing. He is currently looking for work as "disability is taking too long." These are areas of concern for continued patient adherence to medication and will continue to assist as able. Certainly appreciate Jacob Gutierrez's assistance in this. Influenza and Pneumovax updated today. Recheck CD4 and viral load today. Continue current Biktarvy and Bactrim. Plan for follow up in 1 month or sooner if needed.

## 2018-04-25 LAB — T-HELPER CELL (CD4) - (RCID CLINIC ONLY)
CD4 % Helper T Cell: 3 % — ABNORMAL LOW (ref 33–55)
CD4 T Cell Abs: 30 /uL — ABNORMAL LOW (ref 400–2700)

## 2018-04-26 ENCOUNTER — Inpatient Hospital Stay (HOSPITAL_COMMUNITY)
Admission: EM | Admit: 2018-04-26 | Discharge: 2018-05-01 | DRG: 975 | Disposition: A | Discharge status: Home / Self Care | Payer: Medicaid Other | Attending: Family Medicine | Admitting: Family Medicine

## 2018-04-26 ENCOUNTER — Emergency Department (HOSPITAL_COMMUNITY): Payer: Medicaid Other

## 2018-04-26 ENCOUNTER — Encounter (HOSPITAL_COMMUNITY): Payer: Self-pay | Admitting: *Deleted

## 2018-04-26 DIAGNOSIS — Z9079 Acquired absence of other genital organ(s): Secondary | ICD-10-CM

## 2018-04-26 DIAGNOSIS — R51 Headache: Secondary | ICD-10-CM

## 2018-04-26 DIAGNOSIS — D899 Disorder involving the immune mechanism, unspecified: Secondary | ICD-10-CM

## 2018-04-26 DIAGNOSIS — K612 Anorectal abscess: Secondary | ICD-10-CM | POA: Diagnosis present

## 2018-04-26 DIAGNOSIS — Z79899 Other long term (current) drug therapy: Secondary | ICD-10-CM

## 2018-04-26 DIAGNOSIS — R509 Fever, unspecified: Secondary | ICD-10-CM | POA: Diagnosis present

## 2018-04-26 DIAGNOSIS — Z881 Allergy status to other antibiotic agents status: Secondary | ICD-10-CM

## 2018-04-26 DIAGNOSIS — G43909 Migraine, unspecified, not intractable, without status migrainosus: Secondary | ICD-10-CM | POA: Diagnosis present

## 2018-04-26 DIAGNOSIS — Z880 Allergy status to penicillin: Secondary | ICD-10-CM

## 2018-04-26 DIAGNOSIS — B20 Human immunodeficiency virus [HIV] disease: Secondary | ICD-10-CM | POA: Diagnosis present

## 2018-04-26 DIAGNOSIS — E871 Hypo-osmolality and hyponatremia: Secondary | ICD-10-CM | POA: Diagnosis present

## 2018-04-26 DIAGNOSIS — E876 Hypokalemia: Secondary | ICD-10-CM | POA: Diagnosis present

## 2018-04-26 DIAGNOSIS — R8281 Pyuria: Secondary | ICD-10-CM | POA: Diagnosis present

## 2018-04-26 DIAGNOSIS — A419 Sepsis, unspecified organism: Principal | ICD-10-CM | POA: Diagnosis present

## 2018-04-26 DIAGNOSIS — R519 Headache, unspecified: Secondary | ICD-10-CM | POA: Diagnosis present

## 2018-04-26 DIAGNOSIS — F17210 Nicotine dependence, cigarettes, uncomplicated: Secondary | ICD-10-CM | POA: Diagnosis present

## 2018-04-26 DIAGNOSIS — D849 Immunodeficiency, unspecified: Secondary | ICD-10-CM

## 2018-04-26 LAB — HIV-1 RNA QUANT-NO REFLEX-BLD
HIV 1 RNA Quant: 377000 copies/mL — ABNORMAL HIGH
HIV-1 RNA Quant, Log: 5.58 Log copies/mL — ABNORMAL HIGH

## 2018-04-26 LAB — I-STAT CG4 LACTIC ACID, ED: LACTIC ACID, VENOUS: 0.79 mmol/L (ref 0.5–1.9)

## 2018-04-26 MED ORDER — SODIUM CHLORIDE 0.9 % IV SOLN
1000.0000 mL | INTRAVENOUS | Status: DC
  Administered 2018-04-27: 1000 mL via INTRAVENOUS

## 2018-04-26 NOTE — ED Provider Notes (Signed)
[facility]-EMERGENCY DEPT Provider Note   CSN: [id number] Arrival date & time: 04/26/18  1609     History   Chief Complaint Chief Complaint  Patient presents with  . Migraine    HPI Jacob Gutierrez is a 32 y.o. 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 Pt describes his headache as right sided, rated at a 7/10 and nonradiating. He does report some photophobia, but no blurred or double vision.  Associated symptoms include general malaise and hot/cold flashes.  Pt reports taking OTC medication, including tylenol, aleve and Excedrin without relief.  Pt denies measured fever, neck pain, neck stiffness, chest pain, SOB, abd pain, N/V/D, syncope, hematuria.  Pt does report some dysuria but this has been ongoing since his prostate surgery several months ago.  Pt reports he had migraines as a child, but does not regularly get headaches now.  Pt reports he has been taking his HIV medication as directed. Pt reports he has missed 1-2 doses this month.  Pt is also taking daily bactrim and flagyl.  Pt reports he has had LPs in the past for headaches, but has never had Meningitis.     The history is provided by the patient and medical records. No language interpreter was used.    Past Medical History:  Diagnosis Date  . 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 H/O coccidioidomycosis    pulmonary   . HIV disease (HCC)   . Past history of allergy to penicillin-type antibiotic 07-03-2012   desensitization  . PNA (pneumonia)   . Shigella gastroenteritis   . Syphilis    history /treated     Patient Active Problem List   Diagnosis Date Noted  . Headache 04/27/2018  . Condyloma 04/24/2018  . Weakness of both lower extremities   . Protein-calorie  malnutrition, severe 12/12/2017  . Prostate abscess   . Personal history of MRSA (methicillin resistant Staphylococcus aureus) 12/09/2017  . History of ESBL E. coli infection 12/09/2017  . Dehydration   . Shigella infection   . Diarrhea 03/05/2017  . AIDS (acquired immune deficiency syndrome) (HCC)   . Proctitis   . Weight loss   . Homelessness   . 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 Syphilis contact, treated 01/02/2014  . Tobacco use disorder 01/02/2014  . Human immunodeficiency virus (HIV) disease (HCC) 01/01/2014    Past Surgical History:  Procedure Laterality Date  . TRANSURETHRAL RESECTION OF PROSTATE N/A 12/09/2017   Procedure: TRANSURETHRAL RESECTION OF THE PROSTATE (TURP);  Surgeon: Crist Fat, MD;  Location: [facility];  Service: Urology;  Laterality: N/A;        Home Medications    Prior to Admission medications   Medication Sig Start Date End Date Taking? Authorizing Provider  acetaminophen (TYLENOL) 500 MG tablet Take 500-1,000 mg by mouth every 6 (six) hours as needed (for pain, fever, or headache).   Yes [provider]  bictegravir-emtricitabine-tenofovir AF (BIKTARVY) 50-200-25 MG TABS tablet Take 1 tablet by mouth daily. 02/02/18  Yes Daiva Eves, Lisette Grinder, MD  famotidine (PEPCID AC) 10 MG chewable tablet Chew 10 mg by mouth 2 (two) times daily as needed for heartburn.   Yes [provider]  metroNIDAZOLE (FLAGYL) 500 MG tablet Take 500 mg by mouth every 8 (eight)  hours.   Yes [provider]  sulfamethoxazole-trimethoprim (BACTRIM DS,SEPTRA DS) 800-160 MG tablet Take 1 tablet by mouth daily. 02/02/18 06/02/18 Yes Randall Hiss, MD  fluconazole (DIFLUCAN) 200 MG tablet Take 1 tablet (200 mg total) by mouth daily. Patient not taking: Reported on 04/26/2018 01/18/18   Blanchard Kelch, NP  levofloxacin (LEVAQUIN) 500 MG tablet Take 1 tablet (500 mg total) by mouth daily. Patient not taking: Reported on 04/26/2018 02/02/18   Daiva Eves,  Lisette Grinder, MD  oxybutynin (DITROPAN) 5 MG tablet Take 1 tablet (5 mg total) by mouth 3 (three) times daily as needed for bladder spasms. Patient not taking: Reported on 04/26/2018 12/15/17   Rhetta Mura, MD    Family History Family History  Problem Relation Age of Onset  . Hypertension Mother   . Lupus Maternal Grandmother   . Cancer Paternal Grandfather     Social History Social History   Tobacco Use  . Smoking status: Current Every Day Smoker    Packs/day: 0.50    Types: Cigarettes  . Smokeless tobacco: Never Used  . Tobacco comment: cutting back  Substance Use Topics  . Alcohol use: Yes    Alcohol/week: 1.0 standard drinks    Types: 1 Standard drinks or equivalent per week    Comment: whiskey occasionally   . Drug use: Yes    Frequency: 1.0 times per week    Types: Marijuana     Allergies   Amoxicillin   Review of Systems Review of Systems  Constitutional: Positive for chills, fatigue and fever ( subjective). Negative for appetite change, diaphoresis and unexpected weight change.  HENT: Negative for mouth sores.   Eyes: Positive for photophobia. Negative for visual disturbance.  Respiratory: Negative for cough, chest tightness, shortness of breath and wheezing.   Cardiovascular: Negative for chest pain.  Gastrointestinal: Negative for abdominal pain, constipation, diarrhea, nausea and vomiting.  Endocrine: Negative for polydipsia, polyphagia and polyuria.  Genitourinary: Positive for dysuria (ongoing). Negative for frequency, hematuria and urgency.  Musculoskeletal: Negative for back pain, myalgias, neck pain and neck stiffness.  Skin: Negative for rash.  Allergic/Immunologic: Negative for immunocompromised state.  Neurological: Positive for headaches. Negative for syncope and light-headedness.  Hematological: Does not bruise/bleed easily.  Psychiatric/Behavioral: Negative for sleep disturbance. The patient is not nervous/anxious.      Physical  Exam Updated Vital Signs BP 108/86 (BP Location: Left Arm)   Pulse 60   Temp 99.5 F (37.5 C) (Oral)   Resp 18   SpO2 100%   Physical Exam  Constitutional: He is oriented to person, place, and time. He appears well-developed and well-nourished. No distress.  Awake, alert, nontoxic appearance  HENT:  Head: Normocephalic and atraumatic.  Mouth/Throat: Oropharynx is clear and moist. No oropharyngeal exudate.  Eyes: Pupils are equal, round, and reactive to light. Conjunctivae and EOM are normal. No scleral icterus.  No horizontal, vertical or rotational nystagmus  Neck: Normal range of motion. Neck supple.  Full active and passive ROM without pain No midline or paraspinal tenderness No nuchal rigidity or meningeal signs  Cardiovascular: Normal rate, regular rhythm and intact distal pulses.  Pulmonary/Chest: Effort normal and breath sounds normal. No respiratory distress. He has no wheezes. He has no rales.  Equal chest expansion  Abdominal: Soft. Bowel sounds are normal. He exhibits no mass. There is no tenderness. There is no rebound and no guarding.  Musculoskeletal: Normal range of motion. He exhibits no edema.  Lymphadenopathy:    He has  no cervical adenopathy.  Neurological: He is alert and oriented to person, place, and time. No cranial nerve deficit. He exhibits normal muscle tone. Coordination normal.  Mental Status:  Alert, oriented, thought content appropriate. Speech fluent without evidence of aphasia. Able to follow 2 step commands without difficulty.  Cranial Nerves:  II:  pupils equal, round, reactive to light III,IV, VI: ptosis not present, extra-ocular motions intact bilaterally  V,VII: smile symmetric, facial light touch sensation equal VIII: hearing grossly normal bilaterally  IX,X: midline uvula rise  XI: bilateral shoulder shrug equal and strong XII: midline tongue extension  Motor:  5/5 in upper and lower extremities bilaterally including strong and equal grip  strength and dorsiflexion/plantar flexion Sensory: light touch normal in all extremities.  Cerebellar: normal finger-to-nose with bilateral upper extremities Gait: normal gait and balance CV: distal pulses palpable throughout   Skin: Skin is warm and dry. No rash noted. He is not diaphoretic.  Psychiatric: He has a normal mood and affect. His behavior is normal. Judgment and thought content normal.  Nursing note and vitals reviewed.    ED Treatments / Results  Labs (all labs ordered are listed, but only abnormal results are displayed) Labs Reviewed  COMPREHENSIVE METABOLIC PANEL - Abnormal; Notable for the following components:      Result Value   Potassium 3.4 (*)    Calcium 8.6 (*)    AST 62 (*)    ALT 57 (*)    All other components within normal limits  CBC WITH DIFFERENTIAL/PLATELET - Abnormal; Notable for the following components:   WBC 2.4 (*)    Neutro Abs 0.6 (*)    All other components within normal limits  URINALYSIS, ROUTINE W REFLEX MICROSCOPIC - Abnormal; Notable for the following components:   Leukocytes, UA SMALL (*)    All other components within normal limits  CULTURE, BLOOD (ROUTINE X 2)  CULTURE, BLOOD (ROUTINE X 2)  INFLUENZA PANEL BY PCR (TYPE A & B)  I-STAT CG4 LACTIC ACID, ED  I-STAT CG4 LACTIC ACID, ED    EKG ED ECG REPORT   Date: 04/27/2018  Rate: 80  Rhythm: normal sinus rhythm  QRS Axis: normal  Intervals: normal  ST/T Wave abnormalities: normal  Conduction Disutrbances:none  Narrative Interpretation: tachycardia resolved from 12/11/17  Old EKG Reviewed: unchanged  I have personally reviewed the EKG tracing and agree with the computerized printout as noted.   Radiology Dg Chest 2 View  Result Date: 04/26/2018 CLINICAL DATA:  Weakness.  Subjective fever.  HIV positive. EXAM: CHEST - 2 VIEW COMPARISON:  Chest radiographs 12/09/2017 FINDINGS: The cardiomediastinal contours are normal. The lungs are clear. Pulmonary vasculature is normal.  No consolidation, pleural effusion, or pneumothorax. No acute osseous abnormalities are seen. IMPRESSION: No acute chest findings. Electronically Signed   By: Narda Rutherford M.D.   On: 04/26/2018 23:08   Ct Head Wo Contrast  Result Date: 04/26/2018 CLINICAL DATA:  Headache, migraine EXAM: CT HEAD WITHOUT CONTRAST TECHNIQUE: Contiguous axial images were obtained from the base of the skull through the vertex without intravenous contrast. COMPARISON:  MRI brain dated 12/13/2017 FINDINGS: Brain: No evidence of acute infarction, hemorrhage, hydrocephalus, extra-axial collection or mass lesion/mass effect. 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 Vascular: No hyperdense vessel or unexpected calcification. Skull: Normal. Negative for fracture or focal lesion. Sinuses/Orbits: The visualized paranasal sinuses are essentially clear. The mastoid air cells are unopacified. Other: None. IMPRESSION: No evidence of acute intracranial abnormality. Old left parietal infarct. Electronically Signed  By: Charline Bills M.D.   On: 04/26/2018 23:30    Procedures Procedures (including critical care time)  Medications Ordered in ED Medications  0.9 %  sodium chloride infusion (1,000 mLs Intravenous New Bag/Given 04/27/18 0040)  vancomycin (VANCOCIN) 2,000 mg in sodium chloride 0.9 % 500 mL IVPB (has no administration in time range)  sulfamethoxazole-trimethoprim (BACTRIM) 435.52 mg in dextrose 5 % 500 mL IVPB (has no administration in time range)  ceFEPIme (MAXIPIME) 2 g in sodium chloride 0.9 % 100 mL IVPB (2 g Intravenous New Bag/Given 04/27/18 0145)  acetaminophen (TYLENOL) tablet 650 mg (650 mg Oral Given 04/27/18 0137)  potassium chloride SA (K-DUR,KLOR-CON) CR tablet 40 mEq (40 mEq Oral Given 04/27/18 0136)  metoCLOPramide (REGLAN) injection 10 mg (10 mg Intravenous Given 04/27/18 0139)  diphenhydrAMINE (BENADRYL) capsule 25 mg (25 mg Oral Given 04/27/18 0137)     Initial Impression / Assessment and Plan  / ED Course  I have reviewed the triage vital signs and the nursing notes.  Pertinent labs & imaging results that were available during my care of the patient were reviewed by me and considered in my medical decision making (see chart for details).  Clinical Course as of Apr 27 206  Thu Apr 26, 2018  2319 Pt states he does not want LP in the ED.  He requests LP under fluoro.     [HM]  Fri Apr 27, 2018  8295 Paged ID [phone] without response.   [HM]  [phone] Discussed with Pharmacist, Beth who recommends Vancomycin, cefepime and Bactrim   [HM]  0059 No acute abnormality.  I personally reviewed the films.  Left parietal lobe remains unchanged  CT Head Wo Contrast [HM]  0100 No evidence of PNA, PTX or pulmonary edema.  I personally evaluated these images  DG Chest 2 View [HM]  0100 WNL  Lactic Acid, Venous: 0.79 [HM]  0100 baseline  WBC(!): 2.4 [HM]  0100 Slightly elevated AST/ALT above baseline.  No abd pain, nausea or vomiting  AST(!): 62 [HM]  0101 Mild hypokalemia, replacement given in ED  Potassium(!): 3.4 [HM]  0110 Discussed with Dr. Toniann Fail who will admit.   [HM]    Clinical Course User Index [HM] Nayson Traweek, Boyd Kerbs   Presents with primary complaint of headache and subjective fevers and chills at home over the last 3 days.  He is immunocompromise with a history of HIV.  He reports medication compliance however lab work on 04/24/2018 shows a CD4 count of 30 and a viral load of 377,000.  Patient denies neck pain, neck stiffness or vision changes.  Temperature here 99.1 oral lactic acid is normal.  Patient is without evidence of sepsis however I am concerned about potential meningitis.  Patient is adamant that he does not want to lumbar puncture here in the emergency department but under fluoroscopy.  No response from infectious disease after numerous pages.  Discussed situation with pharmacy who recommends vancomycin, Bactrim and cefepime.  Antibiotics given.  Fluids given.   Cultures pending.  No other source of infection found at this time.  Patient will be admitted to the hospitalist service for further management.  He will need coordination with IR for lumbar puncture.  The patient was discussed with and seen by Dr. Adela Lank who agrees with the treatment plan.   Final Clinical Impressions(s) / ED Diagnoses   Final diagnoses:  Acute intractable headache, unspecified headache type  Immunocompromised (HCC)  HIV infection, unspecified symptom status (HCC)  Hypokalemia  ED Discharge Orders    None       Jozy Mcphearson, Boyd Kerbs 04/27/18 0207    Melene Plan, DO 04/27/18 1506

## 2018-04-26 NOTE — ED Triage Notes (Signed)
Per EMS, pt complains of migraine, malaise for the past 3 days. Pt has tried OTC w/o relief.  BP 110/66 HR 86 RR 16 CBG 71

## 2018-04-26 NOTE — ED Notes (Signed)
Pt taken to imaging

## 2018-04-27 ENCOUNTER — Observation Stay (HOSPITAL_COMMUNITY): Payer: Medicaid Other

## 2018-04-27 ENCOUNTER — Encounter (HOSPITAL_COMMUNITY): Payer: Self-pay | Admitting: Internal Medicine

## 2018-04-27 ENCOUNTER — Inpatient Hospital Stay: Payer: Self-pay

## 2018-04-27 ENCOUNTER — Other Ambulatory Visit: Payer: Self-pay

## 2018-04-27 DIAGNOSIS — Z9079 Acquired absence of other genital organ(s): Secondary | ICD-10-CM | POA: Diagnosis not present

## 2018-04-27 DIAGNOSIS — R509 Fever, unspecified: Secondary | ICD-10-CM | POA: Diagnosis not present

## 2018-04-27 DIAGNOSIS — R51 Headache: Secondary | ICD-10-CM | POA: Diagnosis not present

## 2018-04-27 DIAGNOSIS — Z21 Asymptomatic human immunodeficiency virus [HIV] infection status: Secondary | ICD-10-CM | POA: Diagnosis not present

## 2018-04-27 DIAGNOSIS — F17210 Nicotine dependence, cigarettes, uncomplicated: Secondary | ICD-10-CM

## 2018-04-27 DIAGNOSIS — G43909 Migraine, unspecified, not intractable, without status migrainosus: Secondary | ICD-10-CM | POA: Diagnosis present

## 2018-04-27 DIAGNOSIS — Z87448 Personal history of other diseases of urinary system: Secondary | ICD-10-CM

## 2018-04-27 DIAGNOSIS — E876 Hypokalemia: Secondary | ICD-10-CM | POA: Diagnosis present

## 2018-04-27 DIAGNOSIS — E871 Hypo-osmolality and hyponatremia: Secondary | ICD-10-CM | POA: Diagnosis present

## 2018-04-27 DIAGNOSIS — R519 Headache, unspecified: Secondary | ICD-10-CM | POA: Diagnosis present

## 2018-04-27 DIAGNOSIS — Z792 Long term (current) use of antibiotics: Secondary | ICD-10-CM

## 2018-04-27 DIAGNOSIS — Z881 Allergy status to other antibiotic agents status: Secondary | ICD-10-CM | POA: Diagnosis not present

## 2018-04-27 DIAGNOSIS — K612 Anorectal abscess: Secondary | ICD-10-CM | POA: Diagnosis present

## 2018-04-27 DIAGNOSIS — A419 Sepsis, unspecified organism: Secondary | ICD-10-CM | POA: Diagnosis present

## 2018-04-27 DIAGNOSIS — B20 Human immunodeficiency virus [HIV] disease: Secondary | ICD-10-CM

## 2018-04-27 DIAGNOSIS — Z79899 Other long term (current) drug therapy: Secondary | ICD-10-CM | POA: Diagnosis not present

## 2018-04-27 DIAGNOSIS — R197 Diarrhea, unspecified: Secondary | ICD-10-CM | POA: Diagnosis not present

## 2018-04-27 DIAGNOSIS — Z880 Allergy status to penicillin: Secondary | ICD-10-CM | POA: Diagnosis not present

## 2018-04-27 DIAGNOSIS — Z9119 Patient's noncompliance with other medical treatment and regimen: Secondary | ICD-10-CM | POA: Diagnosis not present

## 2018-04-27 DIAGNOSIS — R8281 Pyuria: Secondary | ICD-10-CM | POA: Diagnosis present

## 2018-04-27 LAB — CBC WITH DIFFERENTIAL/PLATELET
BASOS PCT: 0 %
Basophils Absolute: 0 10*3/uL (ref 0.0–0.1)
Basophils Absolute: 0 10*3/uL (ref 0.0–0.1)
Basophils Relative: 0 %
EOS ABS: 0.1 10*3/uL (ref 0.0–0.7)
EOS PCT: 2 %
Eosinophils Absolute: 0 10*3/uL (ref 0.0–0.7)
Eosinophils Relative: 3 %
HCT: 42.6 % (ref 39.0–52.0)
HCT: 44 % (ref 39.0–52.0)
HEMOGLOBIN: 14.4 g/dL (ref 13.0–17.0)
Hemoglobin: 14.9 g/dL (ref 13.0–17.0)
LYMPHS ABS: 1 10*3/uL (ref 0.7–4.0)
Lymphocytes Relative: 41 %
Lymphocytes Relative: 56 %
Lymphs Abs: 0.7 10*3/uL (ref 0.7–4.0)
MCH: 32.3 pg (ref 26.0–34.0)
MCH: 32.3 pg (ref 26.0–34.0)
MCHC: 33.9 g/dL (ref 30.0–36.0)
MCHC: 33.8 g/dL (ref 30.0–36.0)
MCV: 95.2 fL (ref 78.0–100.0)
MCV: 95.5 fL (ref 78.0–100.0)
MONO ABS: 0 10*3/uL — AB (ref 0.1–1.0)
MONO ABS: 0.7 10*3/uL (ref 0.1–1.0)
MONOS PCT: 30 %
Monocytes Relative: 3 %
NEUTROS ABS: 0.6 10*3/uL — AB (ref 1.7–7.7)
Neutro Abs: 0.4 10*3/uL — ABNORMAL LOW (ref 1.7–7.7)
Neutrophils Relative %: 26 %
Neutrophils Relative %: 39 %
PLATELETS: 136 10*3/uL — AB (ref 150–400)
Platelets: 150 10*3/uL (ref 150–400)
RBC: 4.46 MIL/uL (ref 4.22–5.81)
RBC: 4.62 MIL/uL (ref 4.22–5.81)
RDW: 13.4 % (ref 11.5–15.5)
RDW: 13.4 % (ref 11.5–15.5)
WBC: 1.1 10*3/uL — CL (ref 4.0–10.5)
WBC: 2.4 10*3/uL — ABNORMAL LOW (ref 4.0–10.5)

## 2018-04-27 LAB — COMPREHENSIVE METABOLIC PANEL
ALBUMIN: 3.6 g/dL (ref 3.5–5.0)
ALK PHOS: 81 U/L (ref 38–126)
ALT: 57 U/L — AB (ref 0–44)
AST: 62 U/L — ABNORMAL HIGH (ref 15–41)
Anion gap: 11 (ref 5–15)
BUN: 16 mg/dL (ref 6–20)
CALCIUM: 8.6 mg/dL — AB (ref 8.9–10.3)
CHLORIDE: 101 mmol/L (ref 98–111)
CO2: 26 mmol/L (ref 22–32)
CREATININE: 0.97 mg/dL (ref 0.61–1.24)
GFR calc Af Amer: >60 mL/min (ref >60)
GFR calc non Af Amer: >60 mL/min (ref >60)
GLUCOSE: 90 mg/dL (ref 70–99)
Potassium: 3.4 mmol/L — ABNORMAL LOW (ref 3.5–5.1)
SODIUM: 138 mmol/L (ref 135–145)
Total Bilirubin: 0.8 mg/dL (ref 0.3–1.2)
Total Protein: 7.9 g/dL (ref 6.5–8.1)

## 2018-04-27 LAB — URINALYSIS, ROUTINE W REFLEX MICROSCOPIC
Bacteria, UA: NONE SEEN
Bilirubin Urine: NEGATIVE
Glucose, UA: NEGATIVE mg/dL
Hgb urine dipstick: NEGATIVE
KETONES UR: NEGATIVE mg/dL
Nitrite: NEGATIVE
PH: 6 (ref 5.0–8.0)
Protein, ur: NEGATIVE mg/dL
SPECIFIC GRAVITY, URINE: 1.016 (ref 1.005–1.030)

## 2018-04-27 LAB — CSF CELL COUNT WITH DIFFERENTIAL
RBC Count, CSF: 0 /mm3
Tube #: 4
WBC CSF: 4 /mm3 (ref 0–5)

## 2018-04-27 LAB — CRYPTOCOCCAL ANTIGEN: Crypto Ag: NEGATIVE

## 2018-04-27 LAB — MRSA PCR SCREENING: MRSA BY PCR: POSITIVE — AB

## 2018-04-27 LAB — BASIC METABOLIC PANEL
Anion gap: 10 (ref 5–15)
BUN: 17 mg/dL (ref 6–20)
CALCIUM: 8.8 mg/dL — AB (ref 8.9–10.3)
CHLORIDE: 106 mmol/L (ref 98–111)
CO2: 26 mmol/L (ref 22–32)
CREATININE: 0.89 mg/dL (ref 0.61–1.24)
GFR calc Af Amer: >60 mL/min (ref >60)
GFR calc non Af Amer: >60 mL/min (ref >60)
GLUCOSE: 103 mg/dL — AB (ref 70–99)
Potassium: 3.3 mmol/L — ABNORMAL LOW (ref 3.5–5.1)
Sodium: 142 mmol/L (ref 135–145)

## 2018-04-27 LAB — GLUCOSE, CSF: Glucose, CSF: 54 mg/dL (ref 40–70)

## 2018-04-27 LAB — CRYPTOCOCCAL ANTIGEN, CSF: CRYPTO AG: NEGATIVE

## 2018-04-27 LAB — INFLUENZA PANEL BY PCR (TYPE A & B)
INFLAPCR: NEGATIVE
INFLBPCR: NEGATIVE

## 2018-04-27 LAB — PROTEIN, CSF: Total  Protein, CSF: 68 mg/dL — ABNORMAL HIGH (ref 15–45)

## 2018-04-27 MED ORDER — METRONIDAZOLE 500 MG PO TABS
500.0000 mg | ORAL_TABLET | Freq: Three times a day (TID) | ORAL | Status: DC
  Administered 2018-04-27 (×2): 500 mg via ORAL
  Filled 2018-04-27 (×2): qty 1

## 2018-04-27 MED ORDER — FAMOTIDINE IN NACL 20-0.9 MG/50ML-% IV SOLN
20.0000 mg | Freq: Once | INTRAVENOUS | Status: AC
  Administered 2018-04-27: 20 mg via INTRAVENOUS
  Filled 2018-04-27: qty 50

## 2018-04-27 MED ORDER — DIPHENHYDRAMINE HCL 50 MG/ML IJ SOLN: 12.5000 mg | Freq: Once | INTRAMUSCULAR | Status: DC

## 2018-04-27 MED ORDER — VANCOMYCIN HCL IN DEXTROSE 1-5 GM/200ML-% IV SOLN: 1000.0000 mg | Freq: Three times a day (TID) | INTRAVENOUS | Status: DC

## 2018-04-27 MED ORDER — SULFAMETHOXAZOLE-TRIMETHOPRIM 800-160 MG PO TABS: 1.0000 | ORAL_TABLET | Freq: Every day | ORAL | Status: DC

## 2018-04-27 MED ORDER — SULFAMETHOXAZOLE-TRIMETHOPRIM 400-80 MG/5ML IV SOLN
435.5200 mg | Freq: Once | INTRAVENOUS | Status: AC
  Administered 2018-04-27: 435.52 mg via INTRAVENOUS
  Filled 2018-04-27: qty 27.22

## 2018-04-27 MED ORDER — SODIUM CHLORIDE 0.9 % IV SOLN
2.0000 g | INTRAVENOUS | Status: AC
  Administered 2018-04-27: 2 g via INTRAVENOUS
  Filled 2018-04-27: qty 2

## 2018-04-27 MED ORDER — SODIUM CHLORIDE 0.9 % IV SOLN
2.0000 g | Freq: Three times a day (TID) | INTRAVENOUS | Status: DC
  Administered 2018-04-27 – 2018-04-30 (×10): 2 g via INTRAVENOUS
  Filled 2018-04-27 (×11): qty 2

## 2018-04-27 MED ORDER — SULFAMETHOXAZOLE-TRIMETHOPRIM 800-160 MG PO TABS
1.0000 | ORAL_TABLET | Freq: Every day | ORAL | Status: DC
  Administered 2018-04-28 – 2018-05-01 (×4): 1 via ORAL
  Filled 2018-04-27 (×4): qty 1

## 2018-04-27 MED ORDER — VANCOMYCIN HCL 10 G IV SOLR
2000.0000 mg | Freq: Once | INTRAVENOUS | Status: DC
  Filled 2018-04-27: qty 2000

## 2018-04-27 MED ORDER — LIDOCAINE HCL 1 % IJ SOLN
INTRAMUSCULAR | Status: AC
  Administered 2018-04-27: 5 mL
  Filled 2018-04-27: qty 20

## 2018-04-27 MED ORDER — DIPHENHYDRAMINE HCL 50 MG/ML IJ SOLN
25.0000 mg | Freq: Once | INTRAMUSCULAR | Status: AC
  Administered 2018-04-27: 25 mg via INTRAVENOUS
  Filled 2018-04-27: qty 1

## 2018-04-27 MED ORDER — ONDANSETRON HCL 4 MG/2ML IJ SOLN: 4.0000 mg | Freq: Four times a day (QID) | INTRAMUSCULAR | Status: DC | PRN

## 2018-04-27 MED ORDER — BICTEGRAVIR-EMTRICITAB-TENOFOV 50-200-25 MG PO TABS
1.0000 | ORAL_TABLET | Freq: Every day | ORAL | Status: DC
  Administered 2018-04-27 – 2018-05-01 (×5): 1 via ORAL
  Filled 2018-04-27 (×5): qty 1

## 2018-04-27 MED ORDER — SODIUM CHLORIDE 0.9 % IV BOLUS
1000.0000 mL | Freq: Once | INTRAVENOUS | Status: AC
  Administered 2018-04-27: 1000 mL via INTRAVENOUS

## 2018-04-27 MED ORDER — DEXTROSE 5 % IV SOLN
10.0000 mg/kg | Freq: Three times a day (TID) | INTRAVENOUS | Status: DC
  Filled 2018-04-27 (×2): qty 17.4

## 2018-04-27 MED ORDER — SODIUM CHLORIDE 0.9 % IV SOLN
INTRAVENOUS | Status: DC
  Administered 2018-04-27 – 2018-04-28 (×4): via INTRAVENOUS

## 2018-04-27 MED ORDER — SULFAMETHOXAZOLE-TRIMETHOPRIM 800-160 MG PO TABS
1.0000 | ORAL_TABLET | Freq: Two times a day (BID) | ORAL | Status: DC
  Administered 2018-04-27: 1 via ORAL
  Filled 2018-04-27: qty 1

## 2018-04-27 MED ORDER — ALPRAZOLAM 1 MG PO TABS
1.0000 mg | ORAL_TABLET | Freq: Two times a day (BID) | ORAL | Status: DC | PRN
  Administered 2018-04-27: 1 mg via ORAL
  Filled 2018-04-27: qty 1

## 2018-04-27 MED ORDER — DIPHENHYDRAMINE HCL 25 MG PO CAPS
25.0000 mg | ORAL_CAPSULE | Freq: Once | ORAL | Status: AC
  Administered 2018-04-27: 25 mg via ORAL
  Filled 2018-04-27: qty 1

## 2018-04-27 MED ORDER — ACETAMINOPHEN 325 MG PO TABS
650.0000 mg | ORAL_TABLET | Freq: Four times a day (QID) | ORAL | Status: DC | PRN
  Administered 2018-04-27 – 2018-04-30 (×5): 650 mg via ORAL
  Filled 2018-04-27 (×5): qty 2

## 2018-04-27 MED ORDER — SULFAMETHOXAZOLE-TRIMETHOPRIM 400-80 MG/5ML IV SOLN
15.0000 mg/kg/d | Freq: Three times a day (TID) | INTRAVENOUS | Status: DC
  Filled 2018-04-27: qty 27.22

## 2018-04-27 MED ORDER — OXYCODONE HCL 5 MG PO TABS
5.0000 mg | ORAL_TABLET | Freq: Four times a day (QID) | ORAL | Status: DC | PRN
  Administered 2018-05-01: 5 mg via ORAL
  Filled 2018-04-27: qty 1

## 2018-04-27 MED ORDER — METOCLOPRAMIDE HCL 5 MG/ML IJ SOLN
10.0000 mg | Freq: Once | INTRAMUSCULAR | Status: AC
  Administered 2018-04-27: 10 mg via INTRAVENOUS
  Filled 2018-04-27: qty 2

## 2018-04-27 MED ORDER — ONDANSETRON HCL 4 MG PO TABS: 4.0000 mg | ORAL_TABLET | Freq: Four times a day (QID) | ORAL | Status: DC | PRN

## 2018-04-27 MED ORDER — POTASSIUM CHLORIDE CRYS ER 20 MEQ PO TBCR
40.0000 meq | EXTENDED_RELEASE_TABLET | Freq: Once | ORAL | Status: AC
  Administered 2018-04-27: 40 meq via ORAL
  Filled 2018-04-27: qty 2

## 2018-04-27 MED ORDER — IBUPROFEN 200 MG PO TABS
600.0000 mg | ORAL_TABLET | Freq: Once | ORAL | Status: AC
  Administered 2018-04-27: 600 mg via ORAL
  Filled 2018-04-27: qty 3

## 2018-04-27 MED ORDER — SODIUM CHLORIDE 0.9 % IV SOLN: 2.0000 g | Freq: Once | INTRAVENOUS | Status: DC

## 2018-04-27 NOTE — Plan of Care (Signed)
  Problem: Clinical Measurements: Goal: Cardiovascular complication will be avoided Outcome: Progressing   

## 2018-04-27 NOTE — ED Notes (Signed)
ED TO INPATIENT HANDOFF REPORT  Name/Age/Gender Jacob Gutierrez 32 y.o. male  Code Status Code Status History    Date Active Date Inactive Code Status Order ID Comments User Context   12/10/2017 0023 12/15/2017 1926 Full Code [id number]  Etta Quill, DO Inpatient   03/05/2017 2206 03/08/2017 2104 Full Code [id number]  Carlyle Dolly, MD Inpatient      Home/SNF/Other Home  Chief Complaint headache   Level of Care/Admitting Diagnosis ED Disposition    ED Disposition Condition [place]: [facility] [[id number]]  Level of Care: Med-Surg [16]  Diagnosis: Headache [2902111]  Admitting Physician: Rise Patience [phone]  Attending Physician: Rise Patience [phone]  PT Class (Do Not Modify): Observation [104]  PT Acc Code (Do Not Modify): Observation [10022]       Medical History Past Medical History:  Diagnosis Date  . 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 ([place])   . H/O coccidioidomycosis    pulmonary   . HIV disease ([place])   . Past history of allergy to penicillin-type antibiotic 07-03-2012   desensitization  . PNA (pneumonia)   . Shigella gastroenteritis   . Syphilis    history /treated     Allergies Allergies  Allergen Reactions  . Amoxicillin Other (See Comments)    From childhood: "I had a reaction when i was little." (??)    IV Location/Drains/Wounds Patient Lines/Drains/Airways Status   Active Line/Drains/Airways    Name:   Placement date:   Placement time:   Site:   Days:   Peripheral IV 04/27/18 Right Arm   04/27/18    0038    Arm   less than 1   Incision (Closed) 12/09/17 Other (Comment) Other (Comment)   12/09/17    2342     139          Labs/Imaging Results for orders placed or performed during the hospital encounter of 04/26/18 (from the past 48 hour(s))  Comprehensive metabolic panel     Status: Abnormal   Collection Time: 04/26/18 11:38 PM  Result Value Ref  Range   Sodium 138 135 - 145 mmol/L   Potassium 3.4 (L) 3.5 - 5.1 mmol/L   Chloride 101 98 - 111 mmol/L   CO2 26 22 - 32 mmol/L   Glucose, Bld 90 70 - 99 mg/dL   BUN 16 6 - 20 mg/dL   Creatinine, Ser 0.97 0.61 - 1.24 mg/dL   Calcium 8.6 (L) 8.9 - 10.3 mg/dL   Total Protein 7.9 6.5 - 8.1 g/dL   Albumin 3.6 3.5 - 5.0 g/dL   AST 62 (H) 15 - 41 U/L   ALT 57 (H) 0 - 44 U/L   Alkaline Phosphatase 81 38 - 126 U/L   Total Bilirubin 0.8 0.3 - 1.2 mg/dL   GFR calc non Af Amer >60 >60 mL/min   GFR calc Af Amer >60 >60 mL/min    Comment: (NOTE) The eGFR has been calculated using the CKD EPI equation. This calculation has not been validated in all clinical situations. eGFR's persistently <60 mL/min signify possible Chronic Kidney Disease.    Anion gap 11 5 - 15    Comment: Performed at [facility], [place] [address], [place], [place] [address]  CBC WITH DIFFERENTIAL     Status: Abnormal   Collection Time: 04/26/18 11:38 PM  Result Value Ref Range   WBC 2.4 (L) 4.0 - 10.5 K/uL  RBC 4.46 4.22 - 5.81 MIL/uL   Hemoglobin 14.4 13.0 - 17.0 g/dL   HCT 42.6 39.0 - 52.0 %   MCV 95.5 78.0 - 100.0 fL   MCH 32.3 26.0 - 34.0 pg   MCHC 33.8 30.0 - 36.0 g/dL   RDW 13.4 11.5 - 15.5 %   Platelets 150 150 - 400 K/uL   Neutrophils Relative % 26 %   Neutro Abs 0.6 (L) 1.7 - 7.7 K/uL   Lymphocytes Relative 41 %   Lymphs Abs 1.0 0.7 - 4.0 K/uL   Monocytes Relative 30 %   Monocytes Absolute 0.7 0.1 - 1.0 K/uL   Eosinophils Relative 3 %   Eosinophils Absolute 0.1 0.0 - 0.7 K/uL   Basophils Relative 0 %   Basophils Absolute 0.0 0.0 - 0.1 K/uL   WBC Morphology WHITE COUNT CONFIRMED ON SMEAR     Comment: Performed at [facility], [place] [address] [place], [place] [address]  I-Stat CG4 Lactic Acid, ED  (not at  [facility])     Status: None   Collection Time: 04/26/18 11:47 PM  Result Value Ref Range   Lactic Acid, Venous 0.79 0.5 - 1.9 mmol/L  Urinalysis, Routine w reflex  microscopic     Status: Abnormal   Collection Time: 04/26/18 11:47 PM  Result Value Ref Range   Color, Urine YELLOW YELLOW   APPearance CLEAR CLEAR   Specific Gravity, Urine 1.016 1.005 - 1.030   pH 6.0 5.0 - 8.0   Glucose, UA NEGATIVE NEGATIVE mg/dL   Hgb urine dipstick NEGATIVE NEGATIVE   Bilirubin Urine NEGATIVE NEGATIVE   Ketones, ur NEGATIVE NEGATIVE mg/dL   Protein, ur NEGATIVE NEGATIVE mg/dL   Nitrite NEGATIVE NEGATIVE   Leukocytes, UA SMALL (A) NEGATIVE   RBC / HPF 0-5 0 - 5 RBC/hpf   WBC, UA 21-50 0 - 5 WBC/hpf   Bacteria, UA NONE SEEN NONE SEEN   Squamous Epithelial / LPF 0-5 0 - 5   Mucus PRESENT     Comment: Performed at [facility], [place] [address], [place], [place] [address]   Dg Chest 2 View  Result Date: 04/26/2018 CLINICAL DATA:  Weakness.  Subjective fever.  HIV positive. EXAM: CHEST - 2 VIEW COMPARISON:  Chest radiographs 12/09/2017 FINDINGS: The cardiomediastinal contours are normal. The lungs are clear. Pulmonary vasculature is normal. No consolidation, pleural effusion, or pneumothorax. No acute osseous abnormalities are seen. IMPRESSION: No acute chest findings. Electronically Signed   By: Keith Rake M.D.   On: 04/26/2018 23:08   Ct Head Wo Contrast  Result Date: 04/26/2018 CLINICAL DATA:  Headache, migraine EXAM: CT HEAD WITHOUT CONTRAST TECHNIQUE: Contiguous axial images were obtained from the base of the skull through the vertex without intravenous contrast. COMPARISON:  MRI brain dated 12/13/2017 FINDINGS: Brain: No evidence of acute infarction, hemorrhage, hydrocephalus, extra-axial collection or mass lesion/mass effect. 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 Vascular: No hyperdense vessel or unexpected calcification. Skull: Normal. Negative for fracture or focal lesion. Sinuses/Orbits: The visualized paranasal sinuses are essentially clear. The mastoid air cells are unopacified. Other: None. IMPRESSION: No evidence of acute  intracranial abnormality. Old left parietal infarct. Electronically Signed   By: Julian Hy M.D.   On: 04/26/2018 23:30    Pending Labs Unresulted Labs (From admission, onward)    Start     Ordered   04/27/18 0110  Influenza panel by PCR (type A & B)  STAT,   STAT  04/27/18 0109   04/26/18 2244  Blood Culture (routine x 2)  BLOOD CULTURE X 2,   STAT     04/26/18 2245          Vitals/Pain Today's Vitals   04/26/18 1620 04/26/18 2017 04/27/18 0041 04/27/18 0042  BP:  108/86 119/79   Pulse:  60 76   Resp:  18 16   Temp:   99.1 F (37.3 C)   TempSrc:   Oral   SpO2:  100% 100%   Weight:    87.1 kg  Height:    6' 1" (1.854 m)  PainSc: 8   6      Isolation Precautions No active isolations  Medications Medications  0.9 %  sodium chloride infusion (0 mLs Intravenous Stopped 04/27/18 0224)  vancomycin (VANCOCIN) 2,000 mg in sodium chloride 0.9 % 500 mL IVPB (has no administration in time range)  sulfamethoxazole-trimethoprim (BACTRIM) 435.52 mg in dextrose 5 % 500 mL IVPB (has no administration in time range)  acetaminophen (TYLENOL) tablet 650 mg (650 mg Oral Given 04/27/18 0137)  potassium chloride SA (K-DUR,KLOR-CON) CR tablet 40 mEq (40 mEq Oral Given 04/27/18 0136)  metoCLOPramide (REGLAN) injection 10 mg (10 mg Intravenous Given 04/27/18 0139)  diphenhydrAMINE (BENADRYL) capsule 25 mg (25 mg Oral Given 04/27/18 0137)  ceFEPIme (MAXIPIME) 2 g in sodium chloride 0.9 % 100 mL IVPB (0 g Intravenous Stopped 04/27/18 0224)    Mobility walks

## 2018-04-27 NOTE — ED Notes (Addendum)
Second set of blood cultures drawn from right arm.

## 2018-04-27 NOTE — Progress Notes (Signed)
Still attempting to establish IV access for pt, since admission to unit. Nursing and IV team has tried several times, but is unsuccessful. Will inform MD.

## 2018-04-27 NOTE — Progress Notes (Signed)
PROGRESS NOTE  Brief Narrative: Jacob Gutierrez is a 32 y.o. male with a history of HIV (CD4 30, VL 377k), migraine, and sepsis from prostate abscess on prolonged antibiotic therapy after admission who presented with severe right frontal headache associated with fever and chills for a couple days. In the ED he had low grade temperature of 99.38F, no photophobia, N/V, focal deficits, AMS, neck stiffness, pain or rigidity. WBC 2.4. CT head unremarkable. Empiric antimicrobials were started for meningitis and viral encephalitis pending lumbar puncture, which the patient refused in the ED. ID is consulted.   Subjective: He had bodywide redness and itching in response to vancomycin administration and has redness in his eyes without itching or pain, photophobia. No N/V/dizziness, focal deficits. This morning his temperature rose to 103F with sinus tachycardia, remaining normotensive. His major complaint is that he's thirsty and nervous about the LP. Urinating a normal amount, clear yellow. States he's been taking ART and bactrim.  Objective: BP 122/71   Pulse (!) 129   Temp (!) 102.5 F (39.2 C)   Resp (!) 22   Ht 6\' 1"  (1.854 m)   Wt 87.1 kg   SpO2 99%   BMI 25.33 kg/m   Gen: Alert, diaphoretic but not toxic. HEENT: Oropharynx clear with white plaque on tongue.   Pulm: Clear and nonlabored on room air  CV: Regular tachycardia ~110bpm, no murmur, no JVD, no edema GI: Soft, NT, ND, +BS  Neuro: Alert and oriented. No focal deficits. Chin to chest without pain, mils right cervical paraspinal tenderness without midline tenderness. No meningismus. Skin: Diffusely blanching erythema without tenderness. No wounds or mucositis.  Assessment & Plan: Sepsis with suspected CNS infection/headache, fever in immunocompromised patient:  - Empiric coverage with vancomycin (slow infusion due to concern for red man syndrome, has tolerated this in the recent past), meropenem, and currently on acyclovir, though  mentating clearly.  - ID consulted - Cryptococcal antigen pending - Blood cultures drawn and pending - LP pending, will make anxiolytic available. - BP has decreased, still with MAP in mid 70's and no end organ damage at this time. Will give NS @150cc /hr and recheck. If remains tachycardic and hypotensive, may need transfer to SDU.  Red man syndrome:  - Slow vancomycin infusion or replace per ID recommendations  HIV/AIDS: With recent CD4 of 30 and viral load of 377k.  - Continue biktarvy - With administration of vancomycin, will stop bactrim for now  Hypokalemia:  - Replaced, will recheck in AM  History of prostate abscess: Requiring debridement/TURP has completed protracted abx, no current localizing symptoms to this.  Tyrone Nine, MD 04/27/2018, 11:23 AM

## 2018-04-27 NOTE — Progress Notes (Signed)
Patient originally refusing lumbar puncture.  Radiology aware.  MD informed.  Patient changed mind and agreed to lumbar puncture.

## 2018-04-27 NOTE — Progress Notes (Signed)
At patient bedside to place PICC but patient BP is now trending down at 76/36 with HR 130s.  Patient is not stable enough for bedside PICC at this time.  PIV 20 gauge x2 started for patient to receive medications and fluids.  VAST will follow up in the morning to see if PICC still needed.  Gasper Lloyd, RN VAST

## 2018-04-27 NOTE — Progress Notes (Signed)
   04/27/18 1059  MEWS Score  Temp (!) 103 F (39.4 C)  MEWS Score  MEWS RR 0  MEWS Pulse 0  MEWS Systolic 0  MEWS LOC 0  MEWS Temp 2  MEWS Score 2  MEWS Score Color Yellow  MEWS Assessment  Is this an acute change? Yes  MEWS guidelines implemented *See Row Information* Red  Rapid Response Notification  Name of Rapid Response RN Notified Zoey, RN  Date Rapid Response Notified 04/27/18  Time Rapid Response Notified 1100  Provider Notification  Provider Name/Title Dr. Aubery Lapping  Date Provider Notified 04/27/18  Time Provider Notified 1100  Notification Type Page  Notification Reason Change in status  Response See new orders  Date of Provider Response 04/27/18  Time of Provider Response 1105    Patient noted to very warm and diaphoretic, rapid heart rate.  Vitals above taken.  MD and rapid response nurse notified, came and assessed patient.  See new orders.  Patient transferred to step down room 1232.  Levora Angel, RN

## 2018-04-27 NOTE — Consult Note (Addendum)
[facility]       Reason for Consult: HIV, ? meningitis    Referring Physician: Dr. Jarvis Newcomer  Principal Problem:   Headache Active Problems:   Human immunodeficiency virus (HIV) disease (HCC)   . bictegravir-emtricitabine-tenofovir AF  1 tablet Oral Daily  . metroNIDAZOLE  500 mg Oral Q8H    Recommendations: Continue Biktarvy CSF studies including opening pressure, Cryptococcal Ag Bactrim daily prophylaxis Stop acyclovir  OK to remain off of vancomycin after reaction AFB blood culture Can stop droplet precautions if CSF gram stain negative for diplococcus  ADDENDUM: gram stain negative, cell count still pending but no WBCs on cell count.  Has been on flagyl and twice a day bactrim and I will stop this and use bactrim DS daily for prophylaxis.  Can continue off of vancomycin.  Cryptococcal Ag in CSF also negative.    Dr. Orvan Falconer is on over the weekend  Assessment: He has poorly controlled HIV with a CD4 of 30 and viral load at his baseline of 377,000.  Possibilities include infectious such as bacterial meningitis, cryptococcal, viral meningitis.  He is not confused so I don't suspect encephalitis.  Other viral etiologies.  Not c/w IRIS since he has not been on his medications.  Other causes of fever include MAI infection.   Antibiotics: Vancomycin, meropenem, acyclovir Daily Bactrim  HPI: Jacob Gutierrez is a 32 y.o. male with poorly controlled HIV and CD4 as above came in with fever and headache. Previous history of prostatic abscess and has remained on antibiotics.  He had fever and headache for the last 3 days.  Was recently seen in our clinic and had his labs done as above.  He though insists he has been on his Biktarvy without any missed doses despite the laboratory evidence to the contrary.  He also reports adherence to his Bactrim.  No associated rash, no diarrhea, no weight loss.      Review of Systems:  Constitutional: positive for  fevers and malaise or negative for chills Gastrointestinal: negative for nausea and diarrhea Genitourinary: negative for dysuria and genital lesions and penile discharge Integument/breast: negative for rash Musculoskeletal: negative for myalgias and arthralgias Neurological: positive for headaches All other systems reviewed and are negative    Past Medical History:  Diagnosis Date  . 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 H/O coccidioidomycosis    pulmonary   . HIV disease (HCC)   . Past history of allergy to penicillin-type antibiotic 07-03-2012   desensitization  . PNA (pneumonia)   . Shigella gastroenteritis   . Syphilis    history /treated     Social History   Tobacco Use  . Smoking status: Current Every Day Smoker    Packs/day: 0.50    Types: Cigarettes  . Smokeless tobacco: Never Used  . Tobacco comment: cutting back  Substance Use Topics  . Alcohol use: Yes    Alcohol/week: 1.0 standard drinks    Types: 1 Standard drinks or equivalent per week    Comment: whiskey occasionally   . Drug use: Yes    Frequency: 1.0 times per week    Types: Marijuana    Family History  Problem Relation Age of Onset  . Hypertension Mother   . Lupus Maternal Grandmother   . Cancer Paternal Grandfather     Allergies  Allergen Reactions  . Amoxicillin Other (See Comments)    From childhood: "I had a reaction when i  was little." (??)  . Vancomycin Itching    Redness of arms    Physical Exam: Constitutional: in no apparent distress and alert  Vitals:   04/27/18 1208 04/27/18 1415  BP: 102/64   Pulse:    Resp:    Temp: (!) 103.1 F (39.5 C) 100.3 F (37.9 C)  SpO2:     EYES: does not open eyes during interview and exam ENMT: no thrush Cardiovascular: Cor RRR Respiratory: CTA B; normal respiratory effort GI: Bowel sounds are normal, liver is not enlarged, spleen is not enlarged Musculoskeletal: no pedal edema noted Skin:  negatives: no rash Hematologic: no cervical lad  Lab Results  Component Value Date   WBC 1.1 (LL) 04/27/2018   HGB 14.9 04/27/2018   HCT 44.0 04/27/2018   MCV 95.2 04/27/2018   PLT 136 (L) 04/27/2018    Lab Results  Component Value Date   CREATININE 0.89 04/27/2018   BUN 17 04/27/2018   NA 142 04/27/2018   K 3.3 (L) 04/27/2018   CL 106 04/27/2018   CO2 26 04/27/2018    Lab Results  Component Value Date   ALT 57 (H) 04/26/2018   AST 62 (H) 04/26/2018   ALKPHOS 81 04/26/2018     Microbiology: Recent Results (from the past 240 hour(s))  Blood Culture (routine x 2)     Status: None (Preliminary result)   Collection Time: 04/26/18 10:49 PM  Result Value Ref Range Status   Specimen Description   Final    BLOOD RIGHT ARM Performed at [facility], [address] [place], [place] [address]    Special Requests   Final    BOTTLES DRAWN AEROBIC AND ANAEROBIC Blood Culture adequate volume Performed at [facility], [address] [place], [place] [address]    Culture   Final    NO GROWTH < 12 HOURS Performed at [facility], [address] [place], [place] [address]    Report Status PENDING  Incomplete  Blood Culture (routine x 2)     Status: None (Preliminary result)   Collection Time: 04/26/18 11:38 PM  Result Value Ref Range Status   Specimen Description   Final    BLOOD RIGHT ARM Blood Culture results may not be optimal due to an inadequate volume of blood received in culture bottles Performed at [facility], [address], [place], [place] [address]    Special Requests   Final    BOTTLES DRAWN AEROBIC AND ANAEROBIC Performed at [facility], [address], [place], [place] [address]    Culture   Final    NO GROWTH < 12 HOURS Performed at [facility], [address] [place], [place] [address]    Report Status PENDING  Incomplete  MRSA PCR Screening     Status: Abnormal     Collection Time: 04/27/18  4:17 AM  Result Value Ref Range Status   MRSA by PCR POSITIVE (A) NEGATIVE Final    Comment:        The GeneXpert MRSA Assay (FDA approved for NASAL specimens only), is one component of a comprehensive MRSA colonization surveillance program. It is not intended to diagnose MRSA infection nor to guide or monitor treatment for MRSA infections. RESULT CALLED TO, READ BACK BY AND VERIFIED WITH: YOUNT,H @ 0810 ON [id number] BY POTEAT,S Performed at [facility], [address], [place], [place] [address]     Gardiner Barefoot, MD  [facility] [email or web address] [phone] pager  [phone] cell 04/27/2018, 2:51 PM

## 2018-04-27 NOTE — Progress Notes (Signed)
Pt is noncompliant with remaining to lie flat in bed. Educated about risks.

## 2018-04-27 NOTE — Progress Notes (Signed)
Pharmacy Antibiotic Note  Jacob Gutierrez is a 32 y.o. male admitted on 04/26/2018 with meningitis.  Pharmacy has been consulted for vancomycin, acyclovir, meropenem. dosing.  Plan: Vancomycin 2 gm x1 then 1 Gm IV q8h for est AUC = 506 Goal AUC >500 Meropenem 2 Gm IV q8h Acyclovir 870 mg IV q8h F/u scr/cultures/levels  Height: 6\' 1"  (185.4 cm) Weight: 192 lb (87.1 kg) IBW/kg (Calculated) : 79.9  Temp (24hrs), Avg:98.9 F (37.2 C), Min:98.2 F (36.8 C), Max:99.5 F (37.5 C)  Recent Labs  Lab 04/26/18 2338 04/26/18 2347  WBC 2.4*  --   CREATININE 0.97  --   LATICACIDVEN  --  0.79    Estimated Creatinine Clearance: 121.3 mL/min (by C-G formula based on SCr of 0.97 mg/dL).    Allergies  Allergen Reactions  . Amoxicillin Other (See Comments)    From childhood: "I had a reaction when i was little." (??)    Antimicrobials this admission: 10/4 cefepime >> x1 ED 10/4 septra >> x1 ED 10/4 vancomycin >> 10/4 meropenem >> 10/4 acyclovir >>  Dose adjustments this admission:   Microbiology results:  BCx:   UCx:    Sputum:    MRSA PCR:  Thank you for allowing pharmacy to be a part of this patient's care.  Lorenza Evangelist 04/27/2018 3:15 AM

## 2018-04-27 NOTE — Progress Notes (Signed)
Generalized itching with 10 minutes of Vanc Infusion.  Vancomycin stopped benadryl/pepcid adm relief noted within 5 minutes will start Vanc after pepcid infusion at 125/hr

## 2018-04-27 NOTE — H&P (Addendum)
History and Physical    Jacob Gutierrez:[id number] DOB: [birth year] DOA: 04/26/2018  PCP: Grayce Sessions, NP  Patient coming from: Home.  Chief Complaint: Headache.  HPI: Jacob Gutierrez is a 32 y.o. male with history of HIV last CD4 count was 30 and viral count was 377,000 last week who was admitted in month of May 5 months ago for sepsis from prostate abscess and was on prolonged antibiotic therapy presents to the ER with complaints of headache over the last 2 to 3 days.  Patient has recently visited infectious disease clinic on April 24, 2018 3 days ago when patient did not have any symptoms.  Following which patient started having right-sided headache 7 out of 10 intensity with no associated photophobia focal deficits nausea vomiting or neck pain.  Pain has been persistent getting worse tried over-the-counter medication despite which it did not improve.  Also has benign, fever and chills.  Patient states he has been compliant with his antiretroviral.  Has not had any cough or productive sputum chest pain or shortness of breath or diarrhea.  ED Course: In the ER patient was mildly febrile with WBC count around 2.1 on exam patient had no neck rigidity.  Able to move all extremities.  CT head was unremarkable.  Patient at this time is refusing to get lumbar puncture done by physician but would like to get it done by fluoroscopy.  Blood cultures were obtained and patient started on empiric antibiotics for possible meningitis.  Chest x-ray was unremarkable influenza PCR was negative.  Review of Systems: As per HPI, rest all negative.   Past Medical History:  Diagnosis Date  . 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 H/O coccidioidomycosis    pulmonary   . HIV disease (HCC)   . Past history of allergy to penicillin-type antibiotic 07-03-2012   desensitization  . PNA (pneumonia)   . Shigella gastroenteritis   . Syphilis    history /treated       Past Surgical History:  Procedure Laterality Date  . TRANSURETHRAL RESECTION OF PROSTATE N/A 12/09/2017   Procedure: TRANSURETHRAL RESECTION OF THE PROSTATE (TURP);  Surgeon: Crist Fat, MD;  Location: [facility];  Service: Urology;  Laterality: N/A;     reports that he has been smoking cigarettes. He has been smoking about 0.50 packs per day. He has never used smokeless tobacco. He reports that he drinks about 1.0 standard drinks of alcohol per week. He reports that he has current or past drug history. Drug: Marijuana. Frequency: 1.00 time per week.  Allergies  Allergen Reactions  . Amoxicillin Other (See Comments)    From childhood: "I had a reaction when i was little." (??)    Family History  Problem Relation Age of Onset  . Hypertension Mother   . Lupus Maternal Grandmother   . Cancer Paternal Grandfather     Prior to Admission medications   Medication Sig Start Date End Date Taking? Authorizing Provider  acetaminophen (TYLENOL) 500 MG tablet Take 500-1,000 mg by mouth every 6 (six) hours as needed (for pain, fever, or headache).   Yes [provider]  bictegravir-emtricitabine-tenofovir AF (BIKTARVY) 50-200-25 MG TABS tablet Take 1 tablet by mouth daily. 02/02/18  Yes Daiva Eves, Lisette Grinder, MD  famotidine (PEPCID AC) 10 MG chewable tablet Chew 10 mg by mouth 2 (two) times daily as needed for heartburn.   Yes [provider]  metroNIDAZOLE (FLAGYL) 500 MG tablet  Take 500 mg by mouth every 8 (eight) hours.   Yes [provider]  sulfamethoxazole-trimethoprim (BACTRIM DS,SEPTRA DS) 800-160 MG tablet Take 1 tablet by mouth daily. 02/02/18 06/02/18 Yes Randall Hiss, MD  fluconazole (DIFLUCAN) 200 MG tablet Take 1 tablet (200 mg total) by mouth daily. Patient not taking: Reported on 04/26/2018 01/18/18   Blanchard Kelch, NP  levofloxacin (LEVAQUIN) 500 MG tablet Take 1 tablet (500 mg total) by mouth daily. Patient not taking: Reported on  04/26/2018 02/02/18   Daiva Eves, Lisette Grinder, MD  oxybutynin (DITROPAN) 5 MG tablet Take 1 tablet (5 mg total) by mouth 3 (three) times daily as needed for bladder spasms. Patient not taking: Reported on 04/26/2018 12/15/17   Rhetta Mura, MD    Physical Exam: Vitals:   04/26/18 1615 04/26/18 2017 04/27/18 0041 04/27/18 0042  BP: 111/79 108/86 119/79   Pulse: 95 60 76   Resp: 16 18 16    Temp: 99.5 F (37.5 C)  99.1 F (37.3 C)   TempSrc: Oral  Oral   SpO2: 100% 100% 100%   Weight:    87.1 kg  Height:    6\' 1"  (1.854 m)      Constitutional: Moderately built and nourished. Vitals:   04/26/18 1615 04/26/18 2017 04/27/18 0041 04/27/18 0042  BP: 111/79 108/86 119/79   Pulse: 95 60 76   Resp: 16 18 16    Temp: 99.5 F (37.5 C)  99.1 F (37.3 C)   TempSrc: Oral  Oral   SpO2: 100% 100% 100%   Weight:    87.1 kg  Height:    6\' 1"  (1.854 m)   Eyes: Anicteric no pallor. ENMT: No discharge from the ears eyes nose or mouth. Neck: No neck rigidity no mass felt. Respiratory: No rhonchi or crepitations. Cardiovascular: S1-S2 heard no murmurs appreciated. Abdomen: Soft nontender bowel sounds present. Musculoskeletal: No edema.  No joint effusion. Skin: No rash.  Skin appears warm. Neurologic: Alert awake oriented to time place and person.  Moves all extremities. Psychiatric: Appears normal per normal affect.   Labs on Admission: I have personally reviewed following labs and imaging studies  CBC: Recent Labs  Lab 04/26/18 2338  WBC 2.4*  NEUTROABS 0.6*  HGB 14.4  HCT 42.6  MCV 95.5  PLT 150   Basic Metabolic Panel: Recent Labs  Lab 04/26/18 2338  NA 138  K 3.4*  CL 101  CO2 26  GLUCOSE 90  BUN 16  CREATININE 0.97  CALCIUM 8.6*   GFR: Estimated Creatinine Clearance: 121.3 mL/min (by C-G formula based on SCr of 0.97 mg/dL). Liver Function Tests: Recent Labs  Lab 04/26/18 2338  AST 62*  ALT 57*  ALKPHOS 81  BILITOT 0.8  PROT 7.9  ALBUMIN 3.6   No  results for input(s): LIPASE, AMYLASE in the last 168 hours. No results for input(s): AMMONIA in the last 168 hours. Coagulation Profile: No results for input(s): INR, PROTIME in the last 168 hours. Cardiac Enzymes: No results for input(s): CKTOTAL, CKMB, CKMBINDEX, TROPONINI in the last 168 hours. BNP (last 3 results) No results for input(s): PROBNP in the last 8760 hours. HbA1C: No results for input(s): HGBA1C in the last 72 hours. CBG: No results for input(s): GLUCAP in the last 168 hours. Lipid Profile: No results for input(s): CHOL, HDL, LDLCALC, TRIG, CHOLHDL, LDLDIRECT in the last 72 hours. Thyroid Function Tests: No results for input(s): TSH, T4TOTAL, FREET4, T3FREE, THYROIDAB in the last 72 hours. Anemia Panel: No  results for input(s): VITAMINB12, FOLATE, FERRITIN, TIBC, IRON, RETICCTPCT in the last 72 hours. Urine analysis:    Component Value Date/Time   COLORURINE YELLOW 04/26/2018 2347   APPEARANCEUR CLEAR 04/26/2018 2347   APPEARANCEUR Hazy 07/27/2013 1742   LABSPEC 1.016 04/26/2018 2347   LABSPEC 1.026 07/27/2013 1742   PHURINE 6.0 04/26/2018 2347   GLUCOSEU NEGATIVE 04/26/2018 2347   GLUCOSEU Negative 07/27/2013 1742   HGBUR NEGATIVE 04/26/2018 2347   BILIRUBINUR NEGATIVE 04/26/2018 2347   BILIRUBINUR Negative 07/27/2013 1742   KETONESUR NEGATIVE 04/26/2018 2347   PROTEINUR NEGATIVE 04/26/2018 2347   UROBILINOGEN 1 12/17/2013 1118   NITRITE NEGATIVE 04/26/2018 2347   LEUKOCYTESUR SMALL (A) 04/26/2018 2347   LEUKOCYTESUR Negative 07/27/2013 1742   Sepsis Labs: @LABRCNTIP (procalcitonin:4,lacticidven:4) )No results found for this or any previous visit (from the past 240 hour(s)).   Radiological Exams on Admission: Dg Chest 2 View  Result Date: 04/26/2018 CLINICAL DATA:  Weakness.  Subjective fever.  HIV positive. EXAM: CHEST - 2 VIEW COMPARISON:  Chest radiographs 12/09/2017 FINDINGS: The cardiomediastinal contours are normal. The lungs are clear. Pulmonary  vasculature is normal. No consolidation, pleural effusion, or pneumothorax. No acute osseous abnormalities are seen. IMPRESSION: No acute chest findings. Electronically Signed   By: Narda Rutherford M.D.   On: 04/26/2018 23:08   Ct Head Wo Contrast  Result Date: 04/26/2018 CLINICAL DATA:  Headache, migraine EXAM: CT HEAD WITHOUT CONTRAST TECHNIQUE: Contiguous axial images were obtained from the base of the skull through the vertex without intravenous contrast. COMPARISON:  MRI brain dated 12/13/2017 FINDINGS: Brain: No evidence of acute infarction, hemorrhage, hydrocephalus, extra-axial collection or mass lesion/mass effect. 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 Vascular: No hyperdense vessel or unexpected calcification. Skull: Normal. Negative for fracture or focal lesion. Sinuses/Orbits: The visualized paranasal sinuses are essentially clear. The mastoid air cells are unopacified. Other: None. IMPRESSION: No evidence of acute intracranial abnormality. Old left parietal infarct. Electronically Signed   By: Charline Bills M.D.   On: 04/26/2018 23:30     Assessment/Plan Principal Problem:   Headache Active Problems:   Human immunodeficiency virus (HIV) disease (HCC)    1. Headache with fever -primary concern is for meningitis with history of immunocompromise state.  Discussed with pharmacy and patient has been placed on vancomycin meropenem and acyclovir for now.  We will request a fluoroscopic guided lumbar puncture in the morning since patient is refusing lumbar puncture to be done by physicians.  Get infectious disease consult in the morning.  Cryptococcal antigen test has been ordered. 2. AIDS/HIV on antiretrovirals will be continued.   DVT prophylaxis: SCDs in anticipation of lumbar puncture.  Change to Lovenox following. Code Status: Full code. Family Communication: Discussed with patient. Disposition Plan: Home. Consults called: None. Admission status:  Observation.   Eduard Clos MD [facility] Pager [phone].  If 7PM-7AM, please contact night-coverage [email or web address] Password [email or web address]  04/27/2018, 2:31 AM

## 2018-04-28 ENCOUNTER — Inpatient Hospital Stay (HOSPITAL_COMMUNITY): Payer: Medicaid Other

## 2018-04-28 DIAGNOSIS — R509 Fever, unspecified: Secondary | ICD-10-CM | POA: Diagnosis present

## 2018-04-28 DIAGNOSIS — R8281 Pyuria: Secondary | ICD-10-CM

## 2018-04-28 DIAGNOSIS — A419 Sepsis, unspecified organism: Principal | ICD-10-CM

## 2018-04-28 LAB — CBC
HEMATOCRIT: 38.5 % — AB (ref 39.0–52.0)
HEMOGLOBIN: 12.9 g/dL — AB (ref 13.0–17.0)
MCH: 31.5 pg (ref 26.0–34.0)
MCHC: 33.5 g/dL (ref 30.0–36.0)
MCV: 93.9 fL (ref 78.0–100.0)
Platelets: 118 10*3/uL — ABNORMAL LOW (ref 150–400)
RBC: 4.1 MIL/uL — AB (ref 4.22–5.81)
RDW: 13.5 % (ref 11.5–15.5)
WBC: 1.6 10*3/uL — AB (ref 4.0–10.5)

## 2018-04-28 LAB — BASIC METABOLIC PANEL
ANION GAP: 8 (ref 5–15)
BUN: 16 mg/dL (ref 6–20)
CO2: 20 mmol/L — ABNORMAL LOW (ref 22–32)
Calcium: 6.8 mg/dL — ABNORMAL LOW (ref 8.9–10.3)
Chloride: 106 mmol/L (ref 98–111)
Creatinine, Ser: 1 mg/dL (ref 0.61–1.24)
GFR calc non Af Amer: >60 mL/min (ref >60)
GLUCOSE: 94 mg/dL (ref 70–99)
POTASSIUM: 4 mmol/L (ref 3.5–5.1)
Sodium: 134 mmol/L — ABNORMAL LOW (ref 135–145)

## 2018-04-28 MED ORDER — IOHEXOL 300 MG/ML  SOLN
100.0000 mL | Freq: Once | INTRAMUSCULAR | Status: AC | PRN
  Administered 2018-04-28: 100 mL via INTRAVENOUS

## 2018-04-28 MED ORDER — SODIUM CHLORIDE 0.9 % IV BOLUS
1000.0000 mL | Freq: Once | INTRAVENOUS | Status: AC
  Administered 2018-04-28: 1000 mL via INTRAVENOUS

## 2018-04-28 NOTE — Progress Notes (Signed)
Patient ID: Jacob Gutierrez, male   DOB: 20-Sep-1985, 32 y.o.   MRN: [id number]         [facility]  Date of Admission:  04/26/2018           Day 2 meropenem ASSESSMENT: Jacob Gutierrez has fever in the setting of advanced HIV infection with a CD4 count of only 30.  The source of his fever is still not clear.  He does not have meningitis.  He has no evidence of pneumonia.  So far blood cultures are negative.  He does have pyuria.  PLAN: 1. Continue meropenem pending further observation and culture results 2. Continue Biktarvy and prophylactic trimethoprim sulfamethoxazole  Principal Problem:   Fever Active Problems:   Headache   AIDS (acquired immune deficiency syndrome) (HCC)   Human immunodeficiency virus (HIV) disease (HCC)   Scheduled Meds: . bictegravir-emtricitabine-tenofovir AF  1 tablet Oral Daily  . sulfamethoxazole-trimethoprim  1 tablet Oral Daily   Continuous Infusions: . sodium chloride 150 mL/hr at 04/28/18 0332  . meropenem (MERREM) IV Stopped (04/28/18 0300)   PRN Meds:.acetaminophen, ALPRAZolam, ondansetron **OR** ondansetron (ZOFRAN) IV, oxyCODONE  Review of Systems: Review of Systems  Unable to perform ROS: Mental acuity    Allergies  Allergen Reactions  . Amoxicillin Other (See Comments)    From childhood: "I had a reaction when i was little." (??)  . Vancomycin Itching    Redness of arms    OBJECTIVE: Vitals:   04/28/18 0345 04/28/18 0400 04/28/18 0500 04/28/18 0600  BP:  (!) 87/52 91/61 (!) 105/57  Pulse:  (!) 119 95 96  Resp:  15 14 14   Temp: 98.7 F (37.1 C)     TempSrc: Oral     SpO2:  100% 97% 94%  Weight:      Height:       Body mass index is 25.33 kg/m.  Physical Exam  Constitutional:  He awakens to voice but will not answer questions.  Cardiovascular: Normal rate, regular rhythm and normal heart sounds.  Pulmonary/Chest: Effort normal and breath sounds normal.  Abdominal: Soft. He exhibits no  distension. There is no tenderness.  Musculoskeletal: He exhibits no edema or tenderness.  Skin: No rash noted.    Lab Results Lab Results  Component Value Date   WBC 1.6 (L) 04/28/2018   HGB 12.9 (L) 04/28/2018   HCT 38.5 (L) 04/28/2018   MCV 93.9 04/28/2018   PLT 118 (L) 04/28/2018    Lab Results  Component Value Date   CREATININE 1.00 04/28/2018   BUN 16 04/28/2018   NA 134 (L) 04/28/2018   K 4.0 04/28/2018   CL 106 04/28/2018   CO2 20 (L) 04/28/2018    Lab Results  Component Value Date   ALT 57 (H) 04/26/2018   AST 62 (H) 04/26/2018   ALKPHOS 81 04/26/2018   BILITOT 0.8 04/26/2018     Microbiology: Recent Results (from the past 240 hour(s))  Blood Culture (routine x 2)     Status: None (Preliminary result)   Collection Time: 04/26/18 10:49 PM  Result Value Ref Range Status   Specimen Description   Final    BLOOD RIGHT ARM Performed at [facility], [address] [place], [place] [address]    Special Requests   Final    BOTTLES DRAWN AEROBIC AND ANAEROBIC Blood Culture adequate volume Performed at [facility], [address] [place], [place] [address]    Culture  Final    NO GROWTH < 12 HOURS Performed at [facility], [address], [place], [place] [address]    Report Status PENDING  Incomplete  Blood Culture (routine x 2)     Status: None (Preliminary result)   Collection Time: 04/26/18 11:38 PM  Result Value Ref Range Status   Specimen Description   Final    BLOOD RIGHT ARM Blood Culture results may not be optimal due to an inadequate volume of blood received in culture bottles Performed at [facility], [address] [place], [place] [address]    Special Requests   Final    BOTTLES DRAWN AEROBIC AND ANAEROBIC Performed at [facility], [address] [place], [place] [address]    Culture   Final    NO GROWTH < 24 HOURS Performed at [facility],  [address] [place], [place] [address]    Report Status PENDING  Incomplete  MRSA PCR Screening     Status: Abnormal   Collection Time: 04/27/18  4:17 AM  Result Value Ref Range Status   MRSA by PCR POSITIVE (A) NEGATIVE Final    Comment:        The GeneXpert MRSA Assay (FDA approved for NASAL specimens only), is one component of a comprehensive MRSA colonization surveillance program. It is not intended to diagnose MRSA infection nor to guide or monitor treatment for MRSA infections. RESULT CALLED TO, READ BACK BY AND VERIFIED WITH: YOUNT,H @ 0810 ON 100419 BY POTEAT,S Performed at [facility], [address], [place], [place] [address]   CSF culture     Status: None (Preliminary result)   Collection Time: 04/27/18 12:33 PM  Result Value Ref Range Status   Specimen Description CSF  Final   Special Requests NONE  Final   Gram Stain   Final    NO WBC SEEN NO ORGANISMS SEEN CYTOSPIN SMEAR Gram Stain Report Called to,Read Back By and Verified With: T.DUONG AT 1549 ON 04/27/18 BY N.THOMPSON Performed at [facility], [address], [place], [place] [address]    Culture PENDING  Incomplete   Report Status PENDING  Incomplete    Cliffton Asters, MD [facility] [phone] pager   [phone] cell 04/28/2018, 9:51 AM

## 2018-04-28 NOTE — Progress Notes (Signed)
Patient transferred from ICU. VSS, tele applied.Agree with previous RN assessment. Denies any needs at this time will continue to monitor.

## 2018-04-28 NOTE — Progress Notes (Signed)
PROGRESS NOTE  Brief Narrative: Jacob Gutierrez is a 32 y.o. male with a history of HIV (CD4 30, VL 377k), migraine, and sepsis from prostate abscess on prolonged antibiotic therapy after admission who presented with severe right frontal headache associated with fever and chills for a couple days. In the ED he had low grade temperature of 99.48F, no photophobia, N/V, focal deficits, AMS, neck stiffness, pain or rigidity. WBC 2.4. CT head unremarkable. Empiric antimicrobials were started for meningitis and viral encephalitis pending lumbar puncture, which the patient refused in the ED. ID was consulted and LP was ultimately performed with normal opening pressure, CSF analysis revealing no RBCs, 4 WBCs with no WBCs or organisms seen on gram stain, protein modestly elevated at 68. Cryptococcal antigen negative. Urine culture was sent on 10/4 after abx started. ID has narrowed to meropenem alone and continued ART with prophylactic dosing of bactrim. He continues to have fevers, tachycardia, and hypotension.   Subjective: Sleeping this morning and does not agree to talk. He does deny any pain, urinary frequency or urgency. Has had some dysuria/hesitancy for the past several months which has not changed lately. Denies headache or redness or itching today.  Objective: BP (!) 109/42   Pulse 96   Temp 98.7 F (37.1 C) (Oral)   Resp 14   Ht 6\' 1"  (1.854 m)   Wt 87.1 kg   SpO2 100%   BMI 25.33 kg/m   Gen: Nontoxic male in no distress Pulm: Nonlabored breathing room air. Clear. CV: Regular, borderline tachycardia in 90's bpm. No murmur, rub, or gallop. No JVD, no dependent edema. GI: Abdomen soft, non-tender, non-distended, with normoactive bowel sounds.  Ext: Warm, no deformities Skin: No erythema or new rashes, lesions or ulcers on visualized skin. No mucositis.  Neuro: Alert and oriented. No meningismus or focal neurological deficits. Psych: Judgement and insight appear fair. Mood euthymic &  affect congruent. Behavior is appropriate.    Assessment & Plan: Sepsis with suspected CNS infection/headache, fever in immunocompromised patient: LP studies thus far not very convincing of CNS infection, crypto Ag negative. Protein mildly elevated but normal opening pressure, no WBC or organisms on gram stain.  - Monitor Blood, CSF, and urine cultures - Continue meropenem per ID. Stopped vancomycin and acyclovir.  - Continues to be febrile without definitive source identified. With pyuria and h/o prostate abscess, will check pelvic CT - Hypotension worsened, so transferred to SDU with boluses giving mild improvement, MAPs today trending upward above . No end-organ effects at this time (creatinine stable, UOP wnl, mental status stable). Discussed with CCM, Dr. Tonia Brooms. No need for pressors, will continue fluids and monitoring.   Pancytopenia: Near baseline, uncovered with significant IVF's. WBC up a bit from yesterday. Hgb down without active bleeding reported. Possibly due to HIV. - Monitor daily  Red man syndrome: In response to vancomycin, has improved with discontinuation.  HIV/AIDS: With recent CD4 of 30 and viral load of 377k.  - Continue biktarvy and bactrim ppx per ID.  Hypokalemia: Resolved with replacement.   Hyponatremia: Possibly prerenal/hypovolemic.  - Recheck and continue isotonic fluids.  History of prostate abscess: Requiring debridement/TURP has completed protracted abx, no current localizing symptoms to this but pt reports not having significant symptoms when diagnosed. CT pelvis 6/20 showed some residual fluid collection (2x3x2cm) in the posterior penis.  - CT pelvis as above  Tyrone Nine, MD 04/28/2018, 12:42 PM

## 2018-04-28 NOTE — Progress Notes (Signed)
Spoke with [facility] RN re PICC order.  States pt has PIV x 2 working well and is sufficient for pt needs.  [facility] RN to cancel order.

## 2018-04-29 DIAGNOSIS — R197 Diarrhea, unspecified: Secondary | ICD-10-CM

## 2018-04-29 LAB — BASIC METABOLIC PANEL
Anion gap: 7 (ref 5–15)
BUN: 11 mg/dL (ref 6–20)
CHLORIDE: 100 mmol/L (ref 98–111)
CO2: 25 mmol/L (ref 22–32)
CREATININE: 0.79 mg/dL (ref 0.61–1.24)
Calcium: 8.4 mg/dL — ABNORMAL LOW (ref 8.9–10.3)
GFR calc Af Amer: >60 mL/min (ref >60)
GLUCOSE: 105 mg/dL — AB (ref 70–99)
Potassium: 4.6 mmol/L (ref 3.5–5.1)
SODIUM: 132 mmol/L — AB (ref 135–145)

## 2018-04-29 LAB — CBC
HCT: 36.7 % — ABNORMAL LOW (ref 39.0–52.0)
Hemoglobin: 12.4 g/dL — ABNORMAL LOW (ref 13.0–17.0)
MCH: 31.3 pg (ref 26.0–34.0)
MCHC: 33.8 g/dL (ref 30.0–36.0)
MCV: 92.7 fL (ref 78.0–100.0)
PLATELETS: 130 10*3/uL — AB (ref 150–400)
RBC: 3.96 MIL/uL — ABNORMAL LOW (ref 4.22–5.81)
RDW: 13.5 % (ref 11.5–15.5)
WBC: 1.7 10*3/uL — ABNORMAL LOW (ref 4.0–10.5)

## 2018-04-29 LAB — HERPES SIMPLEX VIRUS(HSV) DNA BY PCR
HSV 1 DNA: NEGATIVE
HSV 2 DNA: NEGATIVE

## 2018-04-29 LAB — URINE CULTURE: CULTURE: NO GROWTH

## 2018-04-29 NOTE — Progress Notes (Signed)
Patient ID: Jacob Gutierrez, male   DOB: 23-Jun-1986, 32 y.o.   MRN: [id number]         [facility]  Date of Admission:  04/26/2018           Day 3 meropenem ASSESSMENT: The cause of his recent fever remains uncertain.  His pelvic CT scan shows some changes in the prostate and rectum but it is unclear if this is residual change from previous prostatic abscess or new infection.  His fever is coming down and he is improving clinically.  I will continue meropenem.  PLAN: 1. Continue meropenem  Principal Problem:   Fever Active Problems:   Headache   AIDS (acquired immune deficiency syndrome) (HCC)   Human immunodeficiency virus (HIV) disease (HCC)   Scheduled Meds: . bictegravir-emtricitabine-tenofovir AF  1 tablet Oral Daily  . sulfamethoxazole-trimethoprim  1 tablet Oral Daily   Continuous Infusions: . meropenem (MERREM) IV 2 g (04/29/18 0929)   PRN Meds:.acetaminophen, ALPRAZolam, ondansetron **OR** ondansetron (ZOFRAN) IV, oxyCODONE   SUBJECTIVE: He is feeling better today.  His headache is not as severe.  He denies any dysuria.  He has had a little bit of diarrhea recently.  He has no perirectal pain.  Review of Systems: Review of Systems  Constitutional: Positive for chills, diaphoresis and malaise/fatigue. Negative for fever.  HENT: Negative for congestion and sore throat.   Respiratory: Negative for cough, sputum production and shortness of breath.   Cardiovascular: Negative for chest pain.  Gastrointestinal: Positive for diarrhea. Negative for abdominal pain, nausea and vomiting.  Genitourinary: Negative for dysuria, frequency and urgency.  Musculoskeletal: Negative for back pain, joint pain, myalgias and neck pain.  Skin: Negative for rash.  Neurological: Positive for headaches.    Allergies  Allergen Reactions  . Amoxicillin Other (See Comments)    From childhood: "I had a reaction when i was little." (??)  . Vancomycin Itching      Redness of arms    OBJECTIVE: Vitals:   04/28/18 1600 04/28/18 1732 04/28/18 1857 04/28/18 2014  BP:  (!) 123/58 111/63 (!) 108/59  Pulse:  93 92 93  Resp:  16  14  Temp: 98.6 F (37 C)  98.5 F (36.9 C) 98.8 F (37.1 C)  TempSrc: Oral  Oral Oral  SpO2:  97% 100% 100%  Weight:      Height:       Body mass index is 25.33 kg/m.  Physical Exam  Constitutional: He is oriented to person, place, and time.  He is resting quietly in bed.  He is much more alert and interactive.  HENT:  Mouth/Throat: No oropharyngeal exudate.  Eyes: Conjunctivae are normal.  Neck: Neck supple.  Cardiovascular: Normal rate, regular rhythm and normal heart sounds.  Pulmonary/Chest: Effort normal and breath sounds normal.  Abdominal: Soft. He exhibits no distension. There is no tenderness.  Musculoskeletal: He exhibits no edema or tenderness.  Neurological: He is alert and oriented to person, place, and time.  Skin: No rash noted.  Psychiatric: He has a normal mood and affect.    Lab Results Lab Results  Component Value Date   WBC 1.7 (L) 04/29/2018   HGB 12.4 (L) 04/29/2018   HCT 36.7 (L) 04/29/2018   MCV 92.7 04/29/2018   PLT 130 (L) 04/29/2018    Lab Results  Component Value Date   CREATININE 0.79 04/29/2018   BUN 11 04/29/2018   NA 132 (L) 04/29/2018   K 4.6 04/29/2018  CL 100 04/29/2018   CO2 25 04/29/2018    Lab Results  Component Value Date   ALT 57 (H) 04/26/2018   AST 62 (H) 04/26/2018   ALKPHOS 81 04/26/2018   BILITOT 0.8 04/26/2018     Microbiology: Recent Results (from the past 240 hour(s))  Blood Culture (routine x 2)     Status: None (Preliminary result)   Collection Time: 04/26/18 10:49 PM  Result Value Ref Range Status   Specimen Description   Final    BLOOD RIGHT ARM Performed at [facility], [address], [place], [place] [address]    Special Requests   Final    BOTTLES DRAWN AEROBIC AND ANAEROBIC Blood Culture adequate  volume Performed at [facility], [address] [place], [place] [address]    Culture   Final    NO GROWTH 1 DAY Performed at [facility], [address] [place], [place] [address]    Report Status PENDING  Incomplete  Blood Culture (routine x 2)     Status: None (Preliminary result)   Collection Time: 04/26/18 11:38 PM  Result Value Ref Range Status   Specimen Description   Final    BLOOD RIGHT ARM Blood Culture results may not be optimal due to an inadequate volume of blood received in culture bottles Performed at [facility], [address], [place], [place] [address]    Special Requests   Final    BOTTLES DRAWN AEROBIC AND ANAEROBIC Performed at [facility], [address], [place], [place] [address]    Culture   Final    NO GROWTH 1 DAY Performed at [facility], [address], [place], [place] [address]    Report Status PENDING  Incomplete  MRSA PCR Screening     Status: Abnormal   Collection Time: 04/27/18  4:17 AM  Result Value Ref Range Status   MRSA by PCR POSITIVE (A) NEGATIVE Final    Comment:        The GeneXpert MRSA Assay (FDA approved for NASAL specimens only), is one component of a comprehensive MRSA colonization surveillance program. It is not intended to diagnose MRSA infection nor to guide or monitor treatment for MRSA infections. RESULT CALLED TO, READ BACK BY AND VERIFIED WITH: YOUNT,H @ 0810 ON 100419 BY POTEAT,S Performed at [facility], [address], [place], [place] [address]   CSF culture     Status: None (Preliminary result)   Collection Time: 04/27/18 12:33 PM  Result Value Ref Range Status   Specimen Description   Final    CSF Performed at [facility], [address] [place], [place] [address]    Special Requests   Final    NONE Performed at [facility], [address] [place], [place] [address]     Gram Stain   Final    NO WBC SEEN NO ORGANISMS SEEN CYTOSPIN SMEAR Gram Stain Report Called to,Read Back By and Verified With: T.DUONG AT 1549 ON 04/27/18 BY N.THOMPSON Performed at [facility], [address] [place], [place] [address]    Culture   Final    NO GROWTH 2 DAYS Performed at [facility], [address], [place], [place] [address]    Report Status PENDING  Incomplete  Culture, fungus without smear     Status: None (Preliminary result)   Collection Time: 04/27/18 12:33 PM  Result Value  Ref Range Status   Specimen Description   Final    CSF Performed at [facility], [address], [place], [place] [address]    Special Requests   Final    NONE Performed at [facility], [address], [place], [place] [address]    Culture   Final    NO FUNGUS ISOLATED AFTER 1 DAY Performed at [facility], [address], [place], [place] [address]    Report Status PENDING  Incomplete  Culture, Urine     Status: None   Collection Time: 04/27/18  6:03 PM  Result Value Ref Range Status   Specimen Description   Final    URINE, RANDOM Performed at [facility], [address], [place], [place] [address]    Special Requests   Final    NONE Performed at [facility], [address] [place], [place] [address]    Culture   Final    NO GROWTH Performed at [facility], [address] [place], [place] [address]    Report Status 04/29/2018 FINAL  Final    Cliffton Asters, MD [facility] [phone] pager   [phone] cell 04/29/2018, 9:50 AM

## 2018-04-29 NOTE — Progress Notes (Signed)
PROGRESS NOTE  Brief Narrative: Jacob Gutierrez is a 32 y.o. male with a history of HIV (CD4 30, VL 377k), migraine, and sepsis from prostate abscess on prolonged antibiotic therapy after admission who presented with severe right frontal headache associated with fever and chills for a couple days. In the ED he had low grade temperature of 99.34F, no photophobia, N/V, focal deficits, AMS, neck stiffness, pain or rigidity. WBC 2.4. CT head unremarkable. Empiric antimicrobials were started for meningitis and viral encephalitis pending lumbar puncture, which the patient refused in the ED. ID was consulted and LP was ultimately performed with normal opening pressure, CSF analysis revealing no RBCs, 4 WBCs with no WBCs or organisms seen on gram stain, protein modestly elevated at 68. Cryptococcal antigen negative. Urine culture was sent on 10/4 after abx started. ID has narrowed to meropenem alone and continued ART with prophylactic dosing of bactrim. Repeat CT pelvis demonstrated some fluid faint mottled lucencies involving the left anorectal junction (2.1 x 1.2 x 1.7 cm) with surrounding mucosa is suspicious for a small intramural abscess. Also showed a focus at the apex of the prostate without enhancement, less suspicious for abscess.  Subjective: Bodywide redness itching improving. Swelling is much better from yesterday, eyes still red but no itching, foreign body sensation, pain or blurriness.   Objective: BP (!) 108/59 (BP Location: Right Arm)   Pulse 93   Temp 98.8 F (37.1 C) (Oral)   Resp 14   Ht 6\' 1"  (1.854 m)   Wt 87.1 kg   SpO2 97%   BMI 25.33 kg/m   Gen: 32 y.o. male in no distress Eyes: Conjunctivae erythematous without discharge, corneas without haziness. Pulm: Nonlabored breathing room air. Clear. CV: Regular rate and rhythm. No murmur, rub, or gallop. No JVD, no dependent edema. GI: Abdomen soft, non-tender, non-distended, with normoactive bowel sounds.  Ext: Warm, no  deformities Skin: Mildly erythematous with blanching diffusely, no wounds.  Neuro: Alert and oriented. No focal neurological deficits. Psych: Judgement and insight appear fair. Mood euthymic & affect congruent. Behavior is appropriate.    Assessment & Plan: Sepsis with suspected CNS infection/headache, fever in immunocompromised patient: LP studies thus far not very convincing of CNS infection, crypto Ag negative. Protein mildly elevated but normal opening pressure, no WBC or organisms on gram stain. Urine culture negative. - Monitor Blood, CSF/fungal cultures. - Continue meropenem per ID. Stopped vancomycin and acyclovir.  - Fevers overall better since 10/4. Vitals improving, transferred back to floor 10/5.  Intramural anorectal abscess: Suggested by CT findings. Discussed case with Dr. Maisie Fus, general surgery who reviewed the CT images. Feel strongly that harms of surgery would outweigh benefits and would strongly recommend continuing antibiotics and if still concerned, rescanning.  Pancytopenia: Near baseline. Hgb down without active bleeding reported. Possibly due to HIV. - Monitor daily. Stable/improving.  Red man syndrome: In response to vancomycin, has improved with discontinuation.  HIV/AIDS: With recent CD4 of 30 and viral load of 377k.  - Continue biktarvy and bactrim ppx per ID.  Hypokalemia: Resolved with replacement.   Hyponatremia: DC IVF and monitor as pt was getting edematous.  History of prostate abscess: Requiring debridement/TURP has completed protracted abx, no current localizing symptoms to this but pt reports not having significant symptoms when diagnosed. CT pelvis 6/20 showed some residual fluid collection (2x3x2cm) in the posterior penis which has diminished in size and does not enhance on CT 10/5.    Tyrone Nine, MD 04/29/2018, 2:08 PM

## 2018-04-30 DIAGNOSIS — Z21 Asymptomatic human immunodeficiency virus [HIV] infection status: Secondary | ICD-10-CM

## 2018-04-30 DIAGNOSIS — Z9119 Patient's noncompliance with other medical treatment and regimen: Secondary | ICD-10-CM

## 2018-04-30 LAB — BASIC METABOLIC PANEL
Anion gap: 9 (ref 5–15)
BUN: 15 mg/dL (ref 6–20)
CHLORIDE: 102 mmol/L (ref 98–111)
CO2: 24 mmol/L (ref 22–32)
CREATININE: 0.73 mg/dL (ref 0.61–1.24)
Calcium: 8.9 mg/dL (ref 8.9–10.3)
GFR calc Af Amer: >60 mL/min (ref >60)
GFR calc non Af Amer: >60 mL/min (ref >60)
GLUCOSE: 98 mg/dL (ref 70–99)
Potassium: 4.6 mmol/L (ref 3.5–5.1)
SODIUM: 135 mmol/L (ref 135–145)

## 2018-04-30 LAB — CBC
HCT: 39.9 % (ref 39.0–52.0)
HEMOGLOBIN: 13.5 g/dL (ref 13.0–17.0)
MCH: 31.9 pg (ref 26.0–34.0)
MCHC: 33.8 g/dL (ref 30.0–36.0)
MCV: 94.3 fL (ref 78.0–100.0)
Platelets: 161 10*3/uL (ref 150–400)
RBC: 4.23 MIL/uL (ref 4.22–5.81)
RDW: 13.5 % (ref 11.5–15.5)
WBC: 1.6 10*3/uL — ABNORMAL LOW (ref 4.0–10.5)

## 2018-04-30 MED ORDER — MUPIROCIN 2 % EX OINT
1.0000 "application " | TOPICAL_OINTMENT | Freq: Two times a day (BID) | CUTANEOUS | Status: DC
  Administered 2018-04-30 – 2018-05-01 (×3): 1 via NASAL
  Filled 2018-04-30: qty 22

## 2018-04-30 MED ORDER — CHLORHEXIDINE GLUCONATE CLOTH 2 % EX PADS
6.0000 | MEDICATED_PAD | Freq: Every day | CUTANEOUS | Status: DC
  Administered 2018-04-30: 6 via TOPICAL

## 2018-04-30 NOTE — Progress Notes (Signed)
PROGRESS NOTE  Brief Narrative: Jacob Gutierrez is a 32 y.o. male with a history of HIV (CD4 30, VL 377k), migraine, and sepsis from prostate abscess on prolonged antibiotic therapy after admission who presented with severe right frontal headache associated with fever and chills for a couple days. In the ED he had low grade temperature of 99.51F, no photophobia, N/V, focal deficits, AMS, neck stiffness, pain or rigidity. WBC 2.4. CT head unremarkable. Empiric antimicrobials were started for meningitis and viral encephalitis pending lumbar puncture, which the patient refused in the ED. ID was consulted and LP was ultimately performed with normal opening pressure, CSF analysis revealing no RBCs, 4 WBCs with no WBCs or organisms seen on gram stain, protein modestly elevated at 68. Cryptococcal antigen negative. Urine culture was sent on 10/4 after abx started. ID has narrowed to meropenem alone and continued ART with prophylactic dosing of bactrim. Repeat CT pelvis demonstrated some fluid faint mottled lucencies involving the left anorectal junction (2.1 x 1.2 x 1.7 cm) with surrounding mucosa is suspicious for a small intramural abscess. Also showed a focus at the apex of the prostate without enhancement, less suspicious for abscess.  Subjective: Redness is improving in eyes, itching improving on the skin. No fevers in past 24 hours. Has bifrontal headache described as "muscle ache."   Objective: BP 122/75 (BP Location: Right Arm)   Pulse 78   Temp 97.6 F (36.4 C) (Oral)   Resp 18   Ht 6\' 1"  (1.854 m)   Wt 87.1 kg   SpO2 100%   BMI 25.33 kg/m   Gen: 33 y.o. male in no distress Eyes: Decrease in conjunctival injection. No discharge, corneal abnormality, change in visual acuity. Pulm: Nonlabored breathing room air. Clear. CV: Regular rate and rhythm. No murmur, rub, or gallop. No JVD, no dependent edema. GI: Abdomen soft, non-tender, non-distended, with normoactive bowel sounds.  Ext: Warm, no  deformities Skin: Diffuse erythema is slightly improved. Otherwise no rashes, lesions or ulcers on visualized skin.  Neuro: Alert and oriented. No meningismus. No focal neurological deficits. Psych: Judgement and insight appear fair. Mood euthymic & affect congruent. Behavior is appropriate.    Assessment & Plan: Sepsis with suspected CNS infection/headache, fever in immunocompromised patient: LP studies thus far not very convincing of CNS infection, crypto Ag negative. Protein mildly elevated but normal opening pressure, no WBC or organisms on gram stain. Urine culture negative. - Monitor Blood, CSF/fungal cultures (no growth x3 days). - Continuing meropenem per ID. Stopped vancomycin and acyclovir.  - Fevers overall better since 10/4. Vitals improving, transferred back to floor 10/5.  Intramural anorectal abscess: Suggested by CT findings, not symptomatic. Discussed case with Dr. Maisie Fus, general surgery who reviewed the CT images. Feel strongly that harms of surgery would outweigh benefits and would strongly recommend continuing antibiotics and if still concerned, rescanning.  Pancytopenia: Near baseline. Platelets and hemoglobin have normalized. - Monitor intermittently. Stable/improving.  Red man syndrome: In response to vancomycin, has improved with discontinuation.  HIV/AIDS: With recent CD4 of 30 and viral load of 377k.  - Continue biktarvy and bactrim ppx per ID.  Hypokalemia: Resolved with replacement.   Hyponatremia: Resolved. DC'ed IVF and monitor as pt was getting edematous.  History of prostate abscess: Requiring debridement/TURP has completed protracted abx, no current localizing symptoms to this but pt reports not having significant symptoms when diagnosed. CT pelvis 6/20 showed some residual fluid collection (2x3x2cm) in the posterior penis which has diminished in size and does not enhance  on CT 10/5.    Tyrone Nine, MD 04/30/2018, 1:06 PM

## 2018-04-30 NOTE — Progress Notes (Signed)
[facility]   Reason for visit: Follow up on fever  Interval History: still has headache which he describes as unilateral, frontal, throbbing.  No vision changes.  No fever and WBC remains low.  CT noted with fluid collection.  Still with no prostatic pain.    Physical Exam: Constitutional:  Vitals:   04/30/18 0613 04/30/18 1513  BP: 122/75 115/80  Pulse: 78 70  Resp: 18 16  Temp: 97.6 F (36.4 C) 98 F (36.7 C)  SpO2: 100% 100%   patient appears in NAD Eyes: anicteric HENT: no thrush Respiratory: Normal respiratory effort; CTA B Cardiovascular: RRR GI: soft, nt, nd  Review of Systems: Constitutional: negative for fevers and chills Gastrointestinal: negative for diarrhea Integument/breast: negative for rash  Lab Results  Component Value Date   WBC 1.6 (L) 04/30/2018   HGB 13.5 04/30/2018   HCT 39.9 04/30/2018   MCV 94.3 04/30/2018   PLT 161 04/30/2018    Lab Results  Component Value Date   CREATININE 0.73 04/30/2018   BUN 15 04/30/2018   NA 135 04/30/2018   K 4.6 04/30/2018   CL 102 04/30/2018   CO2 24 04/30/2018    Lab Results  Component Value Date   ALT 57 (H) 04/26/2018   AST 62 (H) 04/26/2018   ALKPHOS 81 04/26/2018     Microbiology: Recent Results (from the past 240 hour(s))  Blood Culture (routine x 2)     Status: None (Preliminary result)   Collection Time: 04/26/18 10:49 PM  Result Value Ref Range Status   Specimen Description   Final    BLOOD RIGHT ARM Performed at [facility], [address], [place], [place] [address]    Special Requests   Final    BOTTLES DRAWN AEROBIC AND ANAEROBIC Blood Culture adequate volume Performed at [facility], [address] [place], [place] [address]    Culture   Final    NO GROWTH 3 DAYS Performed at [facility], [address] [place], [place] [address]    Report Status PENDING  Incomplete  Blood Culture (routine x 2)      Status: None (Preliminary result)   Collection Time: 04/26/18 11:38 PM  Result Value Ref Range Status   Specimen Description   Final    BLOOD RIGHT ARM Blood Culture results may not be optimal due to an inadequate volume of blood received in culture bottles Performed at [facility], [address] [place], [place] [address]    Special Requests   Final    BOTTLES DRAWN AEROBIC AND ANAEROBIC Performed at [facility], [address], [place], [place] [address]    Culture   Final    NO GROWTH 3 DAYS Performed at [facility], [address] [place], [place] [address]    Report Status PENDING  Incomplete  MRSA PCR Screening     Status: Abnormal   Collection Time: 04/27/18  4:17 AM  Result Value Ref Range Status   MRSA by PCR POSITIVE (A) NEGATIVE Final    Comment:        The GeneXpert MRSA Assay (FDA approved for NASAL specimens only), is one component of a comprehensive MRSA colonization surveillance program. It is not intended to diagnose MRSA infection nor to guide or monitor treatment for MRSA infections. RESULT CALLED TO, READ BACK BY AND VERIFIED WITH: YOUNT,H @ 0810 ON 100419 BY POTEAT,S Performed at [facility], [address] [place], [place] [address]   CSF culture     Status: None (Preliminary result)   Collection Time: 04/27/18 12:33 PM  Result Value Ref Range Status   Specimen Description   Final    CSF Performed at [facility], [address], [place], [place] [address]    Special Requests   Final    NONE Performed at [facility], [address] [place], [place] [address]    Gram Stain   Final    NO WBC SEEN NO ORGANISMS SEEN CYTOSPIN SMEAR Gram Stain Report Called to,Read Back By and Verified With: T.DUONG AT 1549 ON 04/27/18 BY N.THOMPSON Performed at [facility], [address], [place], [place] [address]    Culture   Final     NO GROWTH 3 DAYS Performed at [facility], [address] [place], [place] [address]    Report Status PENDING  Incomplete  Culture, fungus without smear     Status: None (Preliminary result)   Collection Time: 04/27/18 12:33 PM  Result Value Ref Range Status   Specimen Description   Final    CSF Performed at [facility], [address] [place], [place] [address]    Special Requests   Final    NONE Performed at [facility], [address] [place], [place] [address]    Culture   Final    NO FUNGUS ISOLATED AFTER 3 DAYS Performed at [facility], [address], [place], [place] [address]    Report Status PENDING  Incomplete  Culture, Urine     Status: None   Collection Time: 04/27/18  6:03 PM  Result Value Ref Range Status   Specimen Description   Final    URINE, RANDOM Performed at [facility], [address], [place], [place] [address]    Special Requests   Final    NONE Performed at [facility], [address] [place], [place] [address]    Culture   Final    NO GROWTH Performed at [facility], [address], [place], [place] [address]    Report Status 04/29/2018 FINAL  Final    Impression/Plan:  1. Fever - seems resolved.  I have stopped meropenem and can observe off of antibiotics.  CSF negative, no other source and he feels better except for the headache.  If his fever returns soon with no other source, I would treat for prostatic abscess again.  2.  Prostatic fluid collection - no symptoms to suggest infection so no indication to treat at this time.   3.  HIV - poor compliance.  Encouraged to continue with Biktarvy.   Already has follow up arranged.  4.  OI prophylaxis - continue with Bactrim DS once daily  5.  Headache - persists.  Seems to be non-infectious.  Description more c/w migraine.  I will defer to primary team for appropriate options.   No other  ID issues, I will sign off. thanks

## 2018-05-01 LAB — CSF CULTURE W GRAM STAIN: Culture: NO GROWTH

## 2018-05-01 LAB — CSF CULTURE: GRAM STAIN: NONE SEEN

## 2018-05-01 NOTE — Discharge Summary (Signed)
Physician Discharge Summary  Jacob Gutierrez AYT:[id number] DOB: [birth year] DOA: 04/26/2018  PCP: Grayce Sessions, NP  Admit date: 04/26/2018 Discharge date: 05/01/2018  Admitted From: Home Disposition: Home   Recommendations for Outpatient Follow-up:  1. Follow up with [facility], Dr. Daiva Eves as scheduled 11/18  Home Health: None Equipment/Devices: None Discharge Condition: Stable CODE STATUS: Full Diet recommendation: Regular  Brief/Interim Summary: Jacob Gutierrez is a 32 y.o. male with a history of HIV and suspected medication nonadherence (CD4 30, VL 377k), migraine, and sepsis from prostate abscess on prolonged antibiotic therapy after admission who presented with severe right frontal headache associated with fever and chills for a couple days. In the ED he had low grade temperature of 99.52F, no photophobia, N/V, focal deficits, AMS, neck stiffness, pain or rigidity. WBC 2.4. CT head unremarkable. Empiric antimicrobials were started for meningitis and viral encephalitis pending lumbar puncture, which the patient refused in the ED. ID was consulted and LP was ultimately performed with normal opening pressure, CSF analysis revealing no RBCs, 4 WBCs with no WBCs or organisms seen on gram stain, protein modestly elevated at 68. Cryptococcal antigen negative. Urine culture was sent on 10/4 after abx started and negative. ID narrowed to meropenem alone and continued ART with prophylactic dosing of bactrim. Repeat CT pelvis demonstrated some fluid faint mottled lucencies involving the left anorectal junction (2.1 x 1.2 x 1.7 cm) with surrounding mucosa is suspicious for a small intramural abscess. Also showed a focus at the apex of the prostate without enhancement, less suspicious for abscess. This was not felt to represent active infection given his lack of pelvic symptoms and surgery recommended no intervention. ID recommended monitoring off antibiotics and he has remained afebrile, stable, and  is stable for discharge with return precautions.   Discharge Diagnoses:  Principal Problem:   Fever Active Problems:   Human immunodeficiency virus (HIV) disease (HCC)   AIDS (acquired immune deficiency syndrome) (HCC)   Headache  Sepsis with suspected CNS infection/headache, fever in immunocompromised patient: LP studies thus far not very convincing of CNS infection, crypto Ag negative. Protein mildly elevated but normal opening pressure, no WBC or organisms on gram stain. Urine culture negative. Blood cultures and fungal cultures negative. Defervesced rapidly and has remained stable off abx. - Note with conjunctival injection and no definitive source identified, could consider viral etiology, specifically adenovirus.  Intramural anorectal abscess: Suggested by CT findings, not symptomatic. Discussed case with Dr. Maisie Fus, general surgery who reviewed the CT images. Feel strongly that harms of surgery would outweigh benefits and would strongly recommend continuing antibiotics and if still concerned, rescanning.  Pancytopenia: Near baseline. Platelets and hemoglobin have normalized. - Monitor intermittently. Stable/improving.  Red man syndrome: In response to vancomycin, has improved with discontinuation.  HIV/AIDS: With recent CD4 of 30 and viral load of 377k.  - Continue biktarvy and bactrim ppx per ID.  Hypokalemia: Resolved with replacement.   Hyponatremia: Resolved. DC'ed IVF and monitor as pt was getting edematous.  History of prostate abscess: Requiring debridement/TURP has completed protracted abx, no current localizing symptoms to this but pt reports not having significant symptoms when diagnosed. CT pelvis 6/20 showed some residual fluid collection (2x3x2cm) in the posterior penis which has diminished in size and does not enhance on CT 10/5.   Discharge Instructions Discharge Instructions    Discharge instructions   Complete by:  As directed    You were evaluated for  headache and fever, with strong suspicion for meningitis or other  nervous system infection, though this was not found during your stay. No definitely identified cause was found and you have improved with supportive therapy. It is possible you had a virus that has also caused your eyes to be red. This is expected to continue improving on its own off of antibiotics.  - Follow up with ID as scheduled. Continue taking biktarvy and daily bactrim.  - Seek medical attention if your symptoms return/worsen.  - You may take tylenol, aleve, OR excedrin for headaches. Follow up with your PCP for ongoing management of headaches.     Allergies as of 05/01/2018      Reactions   Amoxicillin Other (See Comments)   From childhood: "I had a reaction when i was little." (??)   Vancomycin Itching   Redness of arms      Medication List    STOP taking these medications   fluconazole 200 MG tablet Commonly known as:  DIFLUCAN   levofloxacin 500 MG tablet Commonly known as:  LEVAQUIN   oxybutynin 5 MG tablet Commonly known as:  DITROPAN     TAKE these medications   acetaminophen 500 MG tablet Commonly known as:  TYLENOL Take 500-1,000 mg by mouth every 6 (six) hours as needed (for pain, fever, or headache).   bictegravir-emtricitabine-tenofovir AF 50-200-25 MG Tabs tablet Commonly known as:  BIKTARVY Take 1 tablet by mouth daily.   famotidine 10 MG chewable tablet Commonly known as:  PEPCID AC Chew 10 mg by mouth 2 (two) times daily as needed for heartburn.   metroNIDAZOLE 500 MG tablet Commonly known as:  FLAGYL Take 500 mg by mouth every 8 (eight) hours.   sulfamethoxazole-trimethoprim 800-160 MG tablet Commonly known as:  BACTRIM DS,SEPTRA DS Take 1 tablet by mouth daily.      Follow-up Information    Grayce Sessions, NP Follow up.   Specialty:  Internal Medicine Contact information: [address] [place] [address] [phone]        Daiva Eves, Lisette Grinder, MD  Follow up.   Specialty:  Infectious Diseases Why:  as scheduled Contact information: [address] [place] [address] [phone]          Allergies  Allergen Reactions  . Amoxicillin Other (See Comments)    From childhood: "I had a reaction when i was little." (??)  . Vancomycin Itching    Redness of arms    Consultations:  Infectious disease  Interventional radiology  Procedures/Studies: Dg Chest 2 View  Result Date: 04/26/2018 CLINICAL DATA:  Weakness.  Subjective fever.  HIV positive. EXAM: CHEST - 2 VIEW COMPARISON:  Chest radiographs 12/09/2017 FINDINGS: The cardiomediastinal contours are normal. The lungs are clear. Pulmonary vasculature is normal. No consolidation, pleural effusion, or pneumothorax. No acute osseous abnormalities are seen. IMPRESSION: No acute chest findings. Electronically Signed   By: Narda Rutherford M.D.   On: 04/26/2018 23:08   Ct Head Wo Contrast  Result Date: 04/26/2018 CLINICAL DATA:  Headache, migraine EXAM: CT HEAD WITHOUT CONTRAST TECHNIQUE: Contiguous axial images were obtained from the base of the skull through the vertex without intravenous contrast. COMPARISON:  MRI brain dated 12/13/2017 FINDINGS: Brain: No evidence of acute infarction, hemorrhage, hydrocephalus, extra-axial collection or mass lesion/mass effect. 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 Vascular: No hyperdense vessel or unexpected calcification. Skull: Normal. Negative for fracture or focal lesion. Sinuses/Orbits: The visualized paranasal sinuses are essentially clear. The mastoid air cells are unopacified. Other: None. IMPRESSION: No evidence of acute  intracranial abnormality. Old left parietal infarct. Electronically Signed   By: Charline Bills M.D.   On: 04/26/2018 23:30   Ct Pelvis W Contrast  Result Date: 04/28/2018 CLINICAL DATA:  Sepsis from prostate abscess. Abscess of the anorectal region. EXAM: CT PELVIS WITH CONTRAST TECHNIQUE: Multidetector  CT imaging of the pelvis was performed using the standard protocol following the bolus administration of intravenous contrast. CONTRAST:  OMNIPAQUE IOHEXOL 300 MG/ML  SOLN COMPARISON:  01/10/2018 FINDINGS: Urinary Tract: The bladder is physiologically distended. No focal intramural mass, calculus or diverticulum of the bladder. No distal uropathy nor hydroureteronephrosis. Bowel: Slight eccentric mural thickening along the left lateral wall of the distal rectum may simply be due to peristaltic change. Proctitis believed less likely. Faint mottled lucencies near the left anorectal junction is suspicious for a small intramural abscess series images 34 through 40 and series 5/65. This measures approximately 2.1 x 1.2 x 1.7 cm. Conceivably adherent stool might account some of this appearance but given what appears to be some enhancing mucosa overlying this area of hypodensity, favor a small abscess. Vascular/Lymphatic: Nonpathologic sized inguinal lymph nodes. No retroperitoneal, iliac chain or pelvic sidewall adenopathy. Nonaneurysmal common iliac arteries and branch vessels. Reproductive: A smaller hypodense focus of fluid near the posterior base of the penis/apex of prostate measuring 0.7 x 2.2 x 0.9 cm is noted versus 1.7 x 2.7 x 1.5 cm previously. The degree of peripheral enhancement surrounding this focus is diminished since prior exam. Suspect that this represents a TURP defect and less likely residual prostatic abscess. Other:  No free air nor intraperitoneal free fluid. Musculoskeletal: Stable small cartilaginous rests of the left iliac bone. Stable bone islands in the acetabular roofs. No acute nor suspicious osseous abnormality. Degenerative disc flattening L5-S1. IMPRESSION: 1. Faint mottled lucencies involving the left anorectal junction measuring approximately 2.1 x 1.2 x 1.7 cm with surrounding mucosa is suspicious for a small intramural abscess. 2. Nonenhancing hypodense focus near the apex of the  prostate measuring 0.7 x 2.2 x 0.9 cm at site of prior enhancement likely represents a TURP defect. Given lack of enhancement, residual prostatic abscess is believed less likely. Electronically Signed   By: Tollie Eth M.D.   On: 04/28/2018 14:40   [place] Ekg Site Rite  Result Date: 04/27/2018 If Site Rite image not attached, placement could not be confirmed due to current cardiac rhythm.  Dg Fluoro Guide Lumbar Puncture  Result Date: 04/27/2018 CLINICAL DATA:  Severe headache EXAM: DIAGNOSTIC LUMBAR PUNCTURE UNDER FLUOROSCOPIC GUIDANCE FLUOROSCOPY TIME:  Fluoroscopy Time:  24 seconds Radiation Exposure Index (if provided by the fluoroscopic device): 4.9 mGy Number of Acquired Spot Images: 0 PROCEDURE: Informed consent was obtained from the patient prior to the procedure, including potential complications of headache, allergy, and pain. With the patient prone, the lower back was prepped with Betadine. 1% Lidocaine was used for local anesthesia. Lumbar puncture was performed at the L4-5 level using a 20 gauge needle with return of clear CSF with an opening pressure of 16 cm water. 10 ml of CSF were obtained for laboratory studies. The patient tolerated the procedure well and there were no apparent complications. IMPRESSION: Technically successful lumbar puncture under fluoroscopic guidance as described above. Electronically Signed   By: Charlett Nose M.D.   On: 04/27/2018 13:56    Lumbar puncture  Subjective: Feels well. Redness improving everyday. No fever, rectal or pelvic pain. Headache is improved with tylenol, NSAIDs at home and here. No neck stiffness/pain.  Discharge Exam: Vitals:   04/30/18 2031 05/01/18 0552  BP: 123/80 111/77  Pulse: 66 71  Resp: 20 18  Temp: 98.1 F (36.7 C) 98.2 F (36.8 C)  SpO2: 100% 100%   General: Pt is alert, awake, not in acute distress Neck: Supple, no meningismus  Eyes: Conjunctival erythema is stable, no corneal abnormalities, visual acuity  stable. Cardiovascular: RRR, S1/S2 +, no rubs, no gallops Respiratory: CTA bilaterally, no wheezing, no rhonchi Abdominal: Soft, NT, ND, bowel sounds + Extremities: No edema, no cyanosis Skin: Diffuse macular erythema subsided Neuro: A+O, no focal deficits.   Labs: Basic Metabolic Panel: Recent Labs  Lab 04/26/18 2338 04/27/18 0600 04/28/18 0339 04/29/18 0521 04/30/18 0459  NA 138 142 134* 132* 135  K 3.4* 3.3* 4.0 4.6 4.6  CL 101 106 106 100 102  CO2 26 26 20* 25 24  GLUCOSE 90 103* 94 105* 98  BUN 16 17 16 11 15   CREATININE 0.97 0.89 1.00 0.79 0.73  CALCIUM 8.6* 8.8* 6.8* 8.4* 8.9   Liver Function Tests: Recent Labs  Lab 04/26/18 2338  AST 62*  ALT 57*  ALKPHOS 81  BILITOT 0.8  PROT 7.9  ALBUMIN 3.6   CBC: Recent Labs  Lab 04/26/18 2338 04/27/18 0600 04/28/18 0609 04/29/18 0521 04/30/18 0459  WBC 2.4* 1.1* 1.6* 1.7* 1.6*  NEUTROABS 0.6* 0.4*  --   --   --   HGB 14.4 14.9 12.9* 12.4* 13.5  HCT 42.6 44.0 38.5* 36.7* 39.9  MCV 95.5 95.2 93.9 92.7 94.3  PLT 150 136* 118* 130* 161   Urinalysis    Component Value Date/Time   COLORURINE YELLOW 04/26/2018 2347   APPEARANCEUR CLEAR 04/26/2018 2347   APPEARANCEUR Hazy 07/27/2013 1742   LABSPEC 1.016 04/26/2018 2347   LABSPEC 1.026 07/27/2013 1742   PHURINE 6.0 04/26/2018 2347   GLUCOSEU NEGATIVE 04/26/2018 2347   GLUCOSEU Negative 07/27/2013 1742   HGBUR NEGATIVE 04/26/2018 2347   BILIRUBINUR NEGATIVE 04/26/2018 2347   BILIRUBINUR Negative 07/27/2013 1742   KETONESUR NEGATIVE 04/26/2018 2347   PROTEINUR NEGATIVE 04/26/2018 2347   UROBILINOGEN 1 12/17/2013 1118   NITRITE NEGATIVE 04/26/2018 2347   LEUKOCYTESUR SMALL (A) 04/26/2018 2347   LEUKOCYTESUR Negative 07/27/2013 1742    Microbiology Recent Results (from the past 240 hour(s))  Blood Culture (routine x 2)     Status: None (Preliminary result)   Collection Time: 04/26/18 10:49 PM  Result Value Ref Range Status   Specimen Description   Final     BLOOD RIGHT ARM Performed at [facility], [address], [place], [place] [address]    Special Requests   Final    BOTTLES DRAWN AEROBIC AND ANAEROBIC Blood Culture adequate volume Performed at [facility], [address], [place], [place] [address]    Culture   Final    NO GROWTH 4 DAYS Performed at [facility], [address], [place], [place] [address]    Report Status PENDING  Incomplete  Blood Culture (routine x 2)     Status: None (Preliminary result)   Collection Time: 04/26/18 11:38 PM  Result Value Ref Range Status   Specimen Description   Final    BLOOD RIGHT ARM Blood Culture results may not be optimal due to an inadequate volume of blood received in culture bottles Performed at [facility], [address] [place], [place] [address]    Special Requests   Final    BOTTLES DRAWN AEROBIC  AND ANAEROBIC Performed at [facility], [address] [place], [place] [address]    Culture   Final    NO GROWTH 4 DAYS Performed at [facility], [address], [place], [place] [address]    Report Status PENDING  Incomplete  MRSA PCR Screening     Status: Abnormal   Collection Time: 04/27/18  4:17 AM  Result Value Ref Range Status   MRSA by PCR POSITIVE (A) NEGATIVE Final    Comment:        The GeneXpert MRSA Assay (FDA approved for NASAL specimens only), is one component of a comprehensive MRSA colonization surveillance program. It is not intended to diagnose MRSA infection nor to guide or monitor treatment for MRSA infections. RESULT CALLED TO, READ BACK BY AND VERIFIED WITH: YOUNT,H @ 0810 ON 100419 BY POTEAT,S Performed at [facility], [address] [place], [place] [address]   CSF culture     Status: None   Collection Time: 04/27/18 12:33 PM  Result Value Ref Range Status   Specimen Description   Final    CSF Performed at [facility], [address], [place], [place] [address]    Special Requests   Final    NONE Performed at [facility], [address] [place], [place] [address]    Gram Stain   Final    NO WBC SEEN NO ORGANISMS SEEN CYTOSPIN SMEAR Gram Stain Report Called to,Read Back By and Verified With: T.DUONG AT 1549 ON 04/27/18 BY N.THOMPSON Performed at [facility], [address] [place], [place] [address]    Culture   Final    NO GROWTH 3 DAYS Performed at [facility], [address] [place], [place] [address]    Report Status 05/01/2018 FINAL  Final  Culture, fungus without smear     Status: None (Preliminary result)   Collection Time: 04/27/18 12:33 PM  Result Value Ref Range Status   Specimen Description   Final    CSF Performed at [facility], [address], [place], [place] [address]    Special Requests   Final    NONE Performed at [facility], [address], [place], [place] [address]    Culture   Final    NO FUNGUS ISOLATED AFTER 3 DAYS Performed at [facility], [address], [place], [place] [address]    Report Status PENDING  Incomplete  Culture, Urine     Status: None   Collection Time: 04/27/18  6:03 PM  Result Value Ref Range Status   Specimen Description   Final    URINE, RANDOM Performed at [facility], [address], [place], [place] [address]    Special Requests   Final    NONE Performed at [facility], [address], [place], [place] [address]    Culture   Final    NO GROWTH Performed at [facility], [address] [place], [place] [address]    Report Status 04/29/2018 FINAL  Final    Time coordinating discharge: Approximately 40 minutes  Tyrone Nine, MD  [facility] 05/01/2018, 1:50 PM Pager [phone]

## 2018-05-02 LAB — CULTURE, BLOOD (ROUTINE X 2)
CULTURE: NO GROWTH
Culture: NO GROWTH
Special Requests: ADEQUATE

## 2018-05-02 LAB — HSV(HERPES SMPLX VRS)ABS-I+II(IGG)-CSF: HSV TYPE I/II AB, IGG CSF: 2.71 IV — AB (ref <=0.89)

## 2018-05-18 LAB — CULTURE, FUNGUS WITHOUT SMEAR

## 2018-06-08 ENCOUNTER — Emergency Department (HOSPITAL_COMMUNITY): Payer: Medicaid Other

## 2018-06-08 ENCOUNTER — Emergency Department (HOSPITAL_COMMUNITY)
Admission: EM | Admit: 2018-06-08 | Discharge: 2018-06-08 | Disposition: A | Discharge status: Home / Self Care | Payer: Medicaid Other | Source: Home / Self Care | Attending: Emergency Medicine | Admitting: Emergency Medicine

## 2018-06-08 ENCOUNTER — Encounter (HOSPITAL_COMMUNITY): Payer: Self-pay | Admitting: Emergency Medicine

## 2018-06-08 ENCOUNTER — Other Ambulatory Visit: Payer: Self-pay

## 2018-06-08 DIAGNOSIS — B20 Human immunodeficiency virus [HIV] disease: Secondary | ICD-10-CM

## 2018-06-08 DIAGNOSIS — N492 Inflammatory disorders of scrotum: Secondary | ICD-10-CM

## 2018-06-08 DIAGNOSIS — Z21 Asymptomatic human immunodeficiency virus [HIV] infection status: Secondary | ICD-10-CM

## 2018-06-08 LAB — CBC WITH DIFFERENTIAL/PLATELET
Basophils Absolute: 0 10*3/uL (ref 0.0–0.1)
Basophils Relative: 0 %
Eosinophils Absolute: 0 10*3/uL (ref 0.0–0.5)
Eosinophils Relative: 1 %
HCT: 39.8 % (ref 39.0–52.0)
Hemoglobin: 12.9 g/dL — ABNORMAL LOW (ref 13.0–17.0)
Lymphocytes Relative: 16 %
Lymphs Abs: 0.5 10*3/uL — ABNORMAL LOW (ref 0.7–4.0)
MCH: 30.3 pg (ref 26.0–34.0)
MCHC: 32.4 g/dL (ref 30.0–36.0)
MCV: 93.4 fL (ref 80.0–100.0)
Monocytes Absolute: 0.5 10*3/uL (ref 0.1–1.0)
Monocytes Relative: 14 %
Neutro Abs: 2.2 10*3/uL (ref 1.7–7.7)
Neutrophils Relative %: 67 %
Platelets: 205 10*3/uL (ref 150–400)
RBC: 4.26 MIL/uL (ref 4.22–5.81)
RDW: 13.4 % (ref 11.5–15.5)
WBC: 3.3 10*3/uL — ABNORMAL LOW (ref 4.0–10.5)
nRBC: 0 % (ref 0.0–0.2)

## 2018-06-08 LAB — COMPREHENSIVE METABOLIC PANEL
ALT: 14 U/L (ref 0–44)
AST: 25 U/L (ref 15–41)
Albumin: 3.2 g/dL — ABNORMAL LOW (ref 3.5–5.0)
Alkaline Phosphatase: 69 U/L (ref 38–126)
Anion gap: 10 (ref 5–15)
BUN: 14 mg/dL (ref 6–20)
CO2: 21 mmol/L — ABNORMAL LOW (ref 22–32)
Calcium: 8.4 mg/dL — ABNORMAL LOW (ref 8.9–10.3)
Chloride: 99 mmol/L (ref 98–111)
Creatinine, Ser: 0.74 mg/dL (ref 0.61–1.24)
GFR calc Af Amer: >60 mL/min (ref >60)
GFR calc non Af Amer: >60 mL/min (ref >60)
Glucose, Bld: 134 mg/dL — ABNORMAL HIGH (ref 70–99)
Potassium: 3.5 mmol/L (ref 3.5–5.1)
Sodium: 130 mmol/L — ABNORMAL LOW (ref 135–145)
Total Bilirubin: 0.9 mg/dL (ref 0.3–1.2)
Total Protein: 7.8 g/dL (ref 6.5–8.1)

## 2018-06-08 LAB — URINALYSIS, ROUTINE W REFLEX MICROSCOPIC
Bilirubin Urine: NEGATIVE
Glucose, UA: NEGATIVE mg/dL
Ketones, ur: NEGATIVE mg/dL
Nitrite: NEGATIVE
Protein, ur: 30 mg/dL — AB
RBC / HPF: >50 RBC/hpf — ABNORMAL HIGH (ref 0–5)
Specific Gravity, Urine: 1.025 (ref 1.005–1.030)
pH: 6 (ref 5.0–8.0)

## 2018-06-08 LAB — I-STAT CG4 LACTIC ACID, ED
Lactic Acid, Venous: 1.24 mmol/L (ref 0.5–1.9)
Lactic Acid, Venous: 0.78 mmol/L (ref 0.5–1.9)

## 2018-06-08 MED ORDER — LIDOCAINE HCL 2 % IJ SOLN
20.0000 mL | Freq: Once | INTRAMUSCULAR | Status: AC
  Administered 2018-06-08: 400 mg
  Filled 2018-06-08: qty 20

## 2018-06-08 MED ORDER — LINEZOLID 600 MG/300ML IV SOLN
600.0000 mg | Freq: Once | INTRAVENOUS | Status: AC
  Administered 2018-06-08: 600 mg via INTRAVENOUS
  Filled 2018-06-08: qty 300

## 2018-06-08 MED ORDER — SODIUM CHLORIDE 0.9 % IV BOLUS
1000.0000 mL | Freq: Once | INTRAVENOUS | Status: AC
  Administered 2018-06-08: 1000 mL via INTRAVENOUS

## 2018-06-08 MED ORDER — FENTANYL CITRATE (PF) 100 MCG/2ML IJ SOLN
100.0000 ug | Freq: Once | INTRAMUSCULAR | Status: DC
  Filled 2018-06-08: qty 2

## 2018-06-08 MED ORDER — MORPHINE SULFATE (PF) 4 MG/ML IV SOLN
4.0000 mg | Freq: Once | INTRAVENOUS | Status: AC
  Administered 2018-06-08: 4 mg via INTRAVENOUS
  Filled 2018-06-08: qty 1

## 2018-06-08 MED ORDER — IOHEXOL 300 MG/ML  SOLN
100.0000 mL | Freq: Once | INTRAMUSCULAR | Status: AC | PRN
  Administered 2018-06-08: 100 mL via INTRAVENOUS

## 2018-06-08 MED ORDER — LIDOCAINE-EPINEPHRINE (PF) 2 %-1:200000 IJ SOLN
20.0000 mL | Freq: Once | INTRAMUSCULAR | Status: AC
  Administered 2018-06-08: 20 mL
  Filled 2018-06-08: qty 20

## 2018-06-08 MED ORDER — SODIUM CHLORIDE 0.9 % IV SOLN
2.0000 g | Freq: Once | INTRAVENOUS | Status: AC
  Administered 2018-06-08: 2 g via INTRAVENOUS
  Filled 2018-06-08: qty 2

## 2018-06-08 NOTE — ED Triage Notes (Signed)
Pt arrives to ED from with complaints of groin swelling around his perineum for the last 3 days. EMS reports pt had prostate surgery in May. Pt placed in position of comfort with bed locked and lowered, call bell in reach.

## 2018-06-08 NOTE — ED Notes (Signed)
Pt c/o pain at right arm IV site. Infusion switched to left wrist. Pt given hot packs for comfort. IV did not appear to be infiltrated and therefore left in place at this time.

## 2018-06-08 NOTE — ED Provider Notes (Signed)
[facility] EMERGENCY [facility] Provider Note   CSN: [id number] Arrival date & time: 06/08/18  [id number]     History   Chief Complaint Chief Complaint  Patient presents with  . Groin Swelling    HPI Jacob Gutierrez is a 32 y.o. male.  HPI  32 year old male with a history of HIV, migraine, sepsis from prostate abscess on prolonged antibiotic therapy presents today with complaints of scrotal swelling.  Patient notes that symptoms started several days ago with a small developing abscess in his perineum, this is spread up to his scrotum with significant swelling, pain, and induration.  Patient denies any fever at home, denies any abdominal pain.  Patient reports he does have HIV but does not take any medications as he does not believe they work for him.  Most recent CD4 count on 04/24/2018 showing less than 30 with a viral quant of 377,000.  Patient reports that he has been convicted today and will not stay for management other than bedside incision and drainage.  Past Medical History:  Diagnosis Date  . 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 H/O coccidioidomycosis    pulmonary   . HIV disease (HCC)   . Past history of allergy to penicillin-type antibiotic 07-03-2012   desensitization  . PNA (pneumonia)   . Shigella gastroenteritis   . Syphilis    history /treated     Patient Active Problem List   Diagnosis Date Noted  . Fever 04/28/2018  . Headache 04/27/2018  . Condyloma 04/24/2018  . Weakness of both lower extremities   . Protein-calorie malnutrition, severe 12/12/2017  . Prostate abscess   . Personal history of MRSA (methicillin resistant Staphylococcus aureus) 12/09/2017  . History of ESBL E. coli infection 12/09/2017  . Dehydration   . Shigella infection   . Diarrhea 03/05/2017  . AIDS (acquired immune deficiency syndrome) (HCC)   . Proctitis   . Weight loss   . Homelessness   . 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 Syphilis contact, treated 01/02/2014  . Tobacco use disorder 01/02/2014  . Human immunodeficiency virus (HIV) disease (HCC) 01/01/2014    Past Surgical History:  Procedure Laterality Date  . TRANSURETHRAL RESECTION OF PROSTATE N/A 12/09/2017   Procedure: TRANSURETHRAL RESECTION OF THE PROSTATE (TURP);  Surgeon: Crist FatHerrick, Benjamin W, MD;  Location: [facility];  Service: Urology;  Laterality: N/A;        Home Medications    Prior to Admission medications   Medication Sig Start Date End Date Taking? Authorizing Provider  acetaminophen (TYLENOL) 500 MG tablet Take 500-1,000 mg by mouth every 6 (six) hours as needed (for pain, fever, or headache).   Yes [provider]  aspirin-acetaminophen-caffeine (EXCEDRIN MIGRAINE) 838 094 3040250-250-65 MG tablet Take 2 tablets by mouth every 6 (six) hours as needed for headache.   Yes [provider]  famotidine (PEPCID AC) 10 MG chewable tablet Chew 10 mg by mouth 2 (two) times daily as needed for heartburn.   Yes [provider]  sulfamethoxazole-trimethoprim (BACTRIM DS,SEPTRA DS) 800-160 MG tablet Take 1 tablet by mouth daily. 02/02/18 06/08/18 Yes Randall HissVan Dam, Cornelius N, MD  bictegravir-emtricitabine-tenofovir AF (BIKTARVY) 50-200-25 MG TABS tablet Take 1 tablet by mouth daily. Patient not taking: Reported on 06/08/2018 02/02/18   Daiva EvesVan Dam, Lisette Grinderornelius N, MD    Family History Family History  Problem Relation Age of Onset  . Hypertension Mother   . Lupus Maternal Grandmother   . Cancer Paternal  Grandfather     Social History Social History   Tobacco Use  . Smoking status: Current Every Day Smoker    Packs/day: 0.50    Types: Cigarettes  . Smokeless tobacco: Never Used  . Tobacco comment: cutting back  Substance Use Topics  . Alcohol use: Yes    Alcohol/week: 1.0 standard drinks    Types: 1 Standard drinks or equivalent per week    Comment: whiskey occasionally   . Drug use: Yes    Frequency: 1.0 times per week    Types:  Marijuana     Allergies   Amoxicillin and Vancomycin   Review of Systems Review of Systems  All other systems reviewed and are negative.   Physical Exam Updated Vital Signs BP 117/81   Pulse (!) 107   Temp (!) 101.9 F (38.8 C) (Oral)   Resp 18   Ht 6\' 1"  (1.854 m)   Wt 88.5 kg   SpO2 100%   BMI 25.73 kg/m   Physical Exam  Constitutional: He is oriented to person, place, and time. He appears well-developed and well-nourished.  HENT:  Head: Normocephalic and atraumatic.  Eyes: Pupils are equal, round, and reactive to light. Conjunctivae are normal. Right eye exhibits no discharge. Left eye exhibits no discharge. No scleral icterus.  Neck: Normal range of motion. No JVD present. No tracheal deviation present.  Pulmonary/Chest: Effort normal. No stridor.  Abdominal:  Abdomen soft nontender  Genitourinary:  Genitourinary Comments: Induration noted throughout the perineum and inferior scrotum, no fluctuance noted, no drainage  Neurological: He is alert and oriented to person, place, and time. Coordination normal.  Psychiatric: He has a normal mood and affect. His behavior is normal. Judgment and thought content normal.  Nursing note and vitals reviewed.      ED Treatments / Results  Labs (all labs ordered are listed, but only abnormal results are displayed) Labs Reviewed  COMPREHENSIVE METABOLIC PANEL - Abnormal; Notable for the following components:      Result Value   Sodium 130 (*)    CO2 21 (*)    Glucose, Bld 134 (*)    Calcium 8.4 (*)    Albumin 3.2 (*)    All other components within normal limits  CBC WITH DIFFERENTIAL/PLATELET - Abnormal; Notable for the following components:   WBC 3.3 (*)    Hemoglobin 12.9 (*)    Lymphs Abs 0.5 (*)    All other components within normal limits  URINALYSIS, ROUTINE W REFLEX MICROSCOPIC - Abnormal; Notable for the following components:   Hgb urine dipstick MODERATE (*)    Protein, ur 30 (*)    Leukocytes, UA SMALL  (*)    RBC / HPF >50 (*)    Bacteria, UA RARE (*)    Non Squamous Epithelial 0-5 (*)    All other components within normal limits  CULTURE, BLOOD (ROUTINE X 2)  CULTURE, BLOOD (ROUTINE X 2)  T-HELPER CELLS (CD4) COUNT (NOT AT [facility])  HIV-1 RNA QUANT-NO REFLEX-BLD  I-STAT CG4 LACTIC ACID, ED  I-STAT CG4 LACTIC ACID, ED    EKG None  Radiology Ct Pelvis W Contrast  Result Date: 06/08/2018 CLINICAL DATA:  CT Pelvis with contrast due to scrotal mass or lump Pt arrives to ED from with complaints of groin swelling around his perineum for the last 3 days. EMS reports pt had prostate surgery in May. EXAM: CT PELVIS WITH CONTRAST TECHNIQUE: Multidetector CT imaging of the pelvis was performed using the standard protocol following the  bolus administration of intravenous contrast. CONTRAST:  OMNIPAQUE IOHEXOL 300 MG/ML  SOLN COMPARISON:  01/26/2018 FINDINGS: Urinary Tract: LOWER portion of the RIGHT kidney is unremarkable in appearance. Urinary bladder is normal in appearance. Bowel: There is diffuse mild rectal wall thickening and perirectal stranding. Visualized bowel loops are unremarkable. The appendix is well seen and has a normal appearance. Vascular/Lymphatic: No significant vascular abnormality identified. Numerous probably reactive lymph nodes in the inguinal regions bilaterally, none measuring greater than 1.5 centimeters. Reproductive: The prostate is normal in size. By history, the patient has had TURP and prostatic abscess. There is edema of the spermatic cord bilaterally, increased since prior studies. There is marked subcutaneous edema of the perineum, significantly increased since prior study. Bilateral scrotal hydroceles are present. Within the wall of the posterior inferior LEFT hemiscrotum, there is a focal fluid collection which measures 2.0 x 3.7 x 4.5 centimeters, consistent with abscess. Other: No free pelvic fluid. Anterior abdominal wall is unremarkable. Musculoskeletal: No  suspicious bone lesions identified. IMPRESSION: 1. Significant scrotal and perineal edema. 2. Interval development of an abscess in the posterior inferior LEFT scrotal wall, 4.5 centimeters in maximum diameter. 3. Improved appearance of the prostate gland. 4. Persistent mild thickening of the rectal wall, improved compared to prior studies. Electronically Signed   By: Norva Pavlov M.D.   On: 06/08/2018 14:32    Procedures Procedures (including critical care time)  Medications Ordered in ED Medications  lidocaine (XYLOCAINE) 2 % (with pres) injection 400 mg (has no administration in time range)  lidocaine-EPINEPHrine (XYLOCAINE W/EPI) 2 %-1:200000 (PF) injection 20 mL (has no administration in time range)  sodium chloride 0.9 % bolus 1,000 mL (has no administration in time range)  sodium chloride 0.9 % bolus 1,000 mL (0 mLs Intravenous Stopped 06/08/18 1253)  morphine 4 MG/ML injection 4 mg (4 mg Intravenous Given 06/08/18 1042)  linezolid (ZYVOX) IVPB 600 mg (0 mg Intravenous Stopped 06/08/18 1459)  ceFEPIme (MAXIPIME) 2 g in sodium chloride 0.9 % 100 mL IVPB (0 g Intravenous Stopped 06/08/18 1216)  iohexol (OMNIPAQUE) 300 MG/ML solution 100 mL (100 mLs Intravenous Contrast Given 06/08/18 1359)     Initial Impression / Assessment and Plan / ED Course  I have reviewed the triage vital signs and the nursing notes.  Pertinent labs & imaging results that were available during my care of the patient were reviewed by me and considered in my medical decision making (see chart for details).     Labs: I stat lactic acid, CMP, CBC, T-helper, HIV, blood culture, urinalysis  Imaging:  Consults: Dr. Vernie Ammons  Therapeutics: Cefepime, linezolid, morphine, normal saline  Discharge Meds:   Assessment/Plan:   32 year old male presents today with significant scrotal swelling and abscess.  Patient febrile and tachycardic.  Patient is immunocompromised as he has HIV and is uncontrolled.  CT scan  shows scrotal abscess.  I discussed case with on-call urologist who recommends bedside I&D.  Evaluated patient at bedside for incision and drainage.  He is currently in low acuity area and will have to be transferred to higher acuity area for sedation for bedside I&D.  Internal medicine consulted for hospital admission.   Final Clinical Impressions(s) / ED Diagnoses   Final diagnoses:  Scrotal abscess  HIV infection, unspecified symptom status [facility])    ED Discharge Orders    None       Eyvonne Mechanic, PA-C 06/08/18 1659    Arby Barrette, MD 06/21/18 1657

## 2018-06-08 NOTE — ED Provider Notes (Addendum)
I was asked to assist with pain control and/or possible procedural sedation in this 32 year old male undergoing incision and drainage of a scrotal abscess.  Procedure was performed by urology attending at bedside.  Patient tolerated the procedure well, we had fentanyl at bedside but it was not needed.  No procedural sedation, no immediate complications, to be admitted to medicine service for further care.  6:25 PM update, was called to bedside by internal medicine team, patient refusing admission, explains that he has to go deal with a personal matter related to eviction and is possessions being thrown away.  Promises that he will return to the ED afterwards.  Patient informed that he will be leaving the emergency department AGAINST MEDICAL ADVICE, with the risks of leaving including worsening infection and death.  Patient aware of these risks, promises to return.   Elmer SowMichael M. Pilar PlateBero, MD [facility] [email or web address]    Sabas SousBero, Michael M, MD 06/08/18 [id number]    Sabas SousBero, Michael M, MD 06/08/18 [phone]

## 2018-06-09 ENCOUNTER — Other Ambulatory Visit: Payer: Self-pay

## 2018-06-09 ENCOUNTER — Inpatient Hospital Stay (HOSPITAL_COMMUNITY)
Admission: EM | Admit: 2018-06-09 | Discharge: 2018-06-12 | DRG: 580 | Disposition: A | Discharge status: Home / Self Care | Payer: Medicaid Other | Attending: Internal Medicine | Admitting: Internal Medicine

## 2018-06-09 ENCOUNTER — Encounter (HOSPITAL_COMMUNITY): Payer: Self-pay

## 2018-06-09 DIAGNOSIS — Z9119 Patient's noncompliance with other medical treatment and regimen: Secondary | ICD-10-CM

## 2018-06-09 DIAGNOSIS — B9561 Methicillin susceptible Staphylococcus aureus infection as the cause of diseases classified elsewhere: Secondary | ICD-10-CM | POA: Diagnosis not present

## 2018-06-09 DIAGNOSIS — Z9114 Patient's other noncompliance with medication regimen: Secondary | ICD-10-CM

## 2018-06-09 DIAGNOSIS — Z881 Allergy status to other antibiotic agents status: Secondary | ICD-10-CM

## 2018-06-09 DIAGNOSIS — Z880 Allergy status to penicillin: Secondary | ICD-10-CM | POA: Diagnosis not present

## 2018-06-09 DIAGNOSIS — K651 Peritoneal abscess: Secondary | ICD-10-CM

## 2018-06-09 DIAGNOSIS — Z7982 Long term (current) use of aspirin: Secondary | ICD-10-CM | POA: Diagnosis not present

## 2018-06-09 DIAGNOSIS — G8918 Other acute postprocedural pain: Secondary | ICD-10-CM | POA: Diagnosis present

## 2018-06-09 DIAGNOSIS — Z720 Tobacco use: Secondary | ICD-10-CM

## 2018-06-09 DIAGNOSIS — F1290 Cannabis use, unspecified, uncomplicated: Secondary | ICD-10-CM | POA: Diagnosis present

## 2018-06-09 DIAGNOSIS — Z22322 Carrier or suspected carrier of Methicillin resistant Staphylococcus aureus: Secondary | ICD-10-CM | POA: Diagnosis not present

## 2018-06-09 DIAGNOSIS — Z79899 Other long term (current) drug therapy: Secondary | ICD-10-CM

## 2018-06-09 DIAGNOSIS — Z872 Personal history of diseases of the skin and subcutaneous tissue: Secondary | ICD-10-CM | POA: Diagnosis not present

## 2018-06-09 DIAGNOSIS — Z8701 Personal history of pneumonia (recurrent): Secondary | ICD-10-CM | POA: Diagnosis not present

## 2018-06-09 DIAGNOSIS — Z888 Allergy status to other drugs, medicaments and biological substances status: Secondary | ICD-10-CM

## 2018-06-09 DIAGNOSIS — B20 Human immunodeficiency virus [HIV] disease: Secondary | ICD-10-CM

## 2018-06-09 DIAGNOSIS — R300 Dysuria: Secondary | ICD-10-CM | POA: Diagnosis not present

## 2018-06-09 DIAGNOSIS — N492 Inflammatory disorders of scrotum: Secondary | ICD-10-CM

## 2018-06-09 DIAGNOSIS — F17210 Nicotine dependence, cigarettes, uncomplicated: Secondary | ICD-10-CM | POA: Diagnosis present

## 2018-06-09 DIAGNOSIS — Z8619 Personal history of other infectious and parasitic diseases: Secondary | ICD-10-CM

## 2018-06-09 DIAGNOSIS — Z590 Homelessness: Secondary | ICD-10-CM

## 2018-06-09 DIAGNOSIS — L02215 Cutaneous abscess of perineum: Principal | ICD-10-CM | POA: Diagnosis present

## 2018-06-09 LAB — HIV-1 RNA QUANT-NO REFLEX-BLD
HIV 1 RNA Quant: 95100 copies/mL
LOG10 HIV-1 RNA: 4.978 log10copy/mL

## 2018-06-09 LAB — MRSA PCR SCREENING: MRSA by PCR: POSITIVE — AB

## 2018-06-09 MED ORDER — LINEZOLID 600 MG/300ML IV SOLN
600.0000 mg | Freq: Two times a day (BID) | INTRAVENOUS | Status: DC
  Administered 2018-06-10 – 2018-06-11 (×3): 600 mg via INTRAVENOUS
  Filled 2018-06-09 (×3): qty 300

## 2018-06-09 MED ORDER — BICTEGRAVIR-EMTRICITAB-TENOFOV 50-200-25 MG PO TABS
1.0000 | ORAL_TABLET | Freq: Every day | ORAL | Status: DC
  Administered 2018-06-09 – 2018-06-12 (×4): 1 via ORAL
  Filled 2018-06-09 (×5): qty 1

## 2018-06-09 MED ORDER — ACETAMINOPHEN 325 MG PO TABS
650.0000 mg | ORAL_TABLET | Freq: Four times a day (QID) | ORAL | Status: DC | PRN
  Administered 2018-06-09: 650 mg via ORAL
  Filled 2018-06-09: qty 2

## 2018-06-09 MED ORDER — LINEZOLID 600 MG/300ML IV SOLN
600.0000 mg | Freq: Once | INTRAVENOUS | Status: AC
  Administered 2018-06-09: 600 mg via INTRAVENOUS
  Filled 2018-06-09: qty 300

## 2018-06-09 MED ORDER — ONDANSETRON HCL 4 MG/2ML IJ SOLN: 4.0000 mg | Freq: Four times a day (QID) | INTRAMUSCULAR | Status: DC | PRN

## 2018-06-09 MED ORDER — CHLORHEXIDINE GLUCONATE CLOTH 2 % EX PADS
6.0000 | MEDICATED_PAD | Freq: Every day | CUTANEOUS | Status: DC
  Administered 2018-06-11: 6 via TOPICAL

## 2018-06-09 MED ORDER — MORPHINE SULFATE (PF) 4 MG/ML IV SOLN
4.0000 mg | Freq: Once | INTRAVENOUS | Status: AC
  Administered 2018-06-09: 4 mg via INTRAVENOUS
  Filled 2018-06-09: qty 1

## 2018-06-09 MED ORDER — ACETAMINOPHEN 650 MG RE SUPP: 650.0000 mg | Freq: Four times a day (QID) | RECTAL | Status: DC | PRN

## 2018-06-09 MED ORDER — MUPIROCIN 2 % EX OINT
1.0000 "application " | TOPICAL_OINTMENT | Freq: Two times a day (BID) | CUTANEOUS | Status: DC
  Administered 2018-06-09 – 2018-06-12 (×6): 1 via NASAL
  Filled 2018-06-09 (×2): qty 22

## 2018-06-09 MED ORDER — SODIUM CHLORIDE 0.9 % IV SOLN
2.0000 g | Freq: Two times a day (BID) | INTRAVENOUS | Status: DC
  Administered 2018-06-09 – 2018-06-11 (×4): 2 g via INTRAVENOUS
  Filled 2018-06-09 (×7): qty 2

## 2018-06-09 MED ORDER — SULFAMETHOXAZOLE-TRIMETHOPRIM 800-160 MG PO TABS
1.0000 | ORAL_TABLET | Freq: Every day | ORAL | Status: DC
  Administered 2018-06-09 – 2018-06-12 (×4): 1 via ORAL
  Filled 2018-06-09 (×4): qty 1

## 2018-06-09 MED ORDER — AZITHROMYCIN 600 MG PO TABS
1200.0000 mg | ORAL_TABLET | ORAL | Status: DC
  Administered 2018-06-09: 1200 mg via ORAL
  Filled 2018-06-09: qty 2

## 2018-06-09 MED ORDER — SODIUM CHLORIDE 0.9 % IV SOLN
2.0000 g | Freq: Two times a day (BID) | INTRAVENOUS | Status: DC
  Administered 2018-06-09: 2 g via INTRAVENOUS
  Filled 2018-06-09: qty 2

## 2018-06-09 MED ORDER — ENOXAPARIN SODIUM 40 MG/0.4ML ~~LOC~~ SOLN
40.0000 mg | SUBCUTANEOUS | Status: DC
  Administered 2018-06-09 – 2018-06-12 (×4): 40 mg via SUBCUTANEOUS
  Filled 2018-06-09 (×4): qty 0.4

## 2018-06-09 MED ORDER — ONDANSETRON HCL 4 MG PO TABS: 4.0000 mg | ORAL_TABLET | Freq: Four times a day (QID) | ORAL | Status: DC | PRN

## 2018-06-09 NOTE — Consult Note (Signed)
Urology Consult  CC: Referring physician: Elmer Sow. Bero, MD  Reason for referral: Perineal abscess  Procedure: I discussed the procedure with the patient in detail including its potential risks and complications and the alternatives.  He has agreed to proceed with incision and drainage of his perineal abscess. I used Betadine to prep the perineum.  I then injected 2% lidocaine with epinephrine into the perineal skin.  This was done over the area of greatest fluctuance.  Once time was given for anesthetic effect I made an incision in the midline over the area of greatest fluctuance and noted thin, dark, reddish/brown fluid drain from the perineum.  I obtained a culture.  I then irrigated the wound copiously with normal saline using a bulb syringe.  Digital exploration of the abscess cavity was then undertaken to be sure there were no loculations and to evaluate the extent of the cavity.  It did extend anteriorly slightly.  I then packed the wound with iodoform gauze, applied a sterile gauze dressing and meshed briefs were applied as well.  The patient tolerated the procedure well with no complications.  Impression/Assessment: Perineal abscess: This patient with poorly controlled HIV has a history of multiple skin abscesses as well as a prostate abscess.  I was able to drain the abscess completely.  There is no evidence of Fournier's gangrene.  There is associated induration and swelling of the posterior scrotal skin but no areas of fluctuance and no sign of abscess on CT scan.  He is currently on antibiotics.  He will need to be maintained on antibiotics and will need his packing removed, wound irrigated and repacked again in 48 hours.  Plan:  1.  Culture of abscess performed. 2.  Abscess now drained and packed. 3.  Broad-spectrum antibiotic coverage while awaiting culture results. 4.  We will follow and assist with care.   History of Present Illness: [name] is a 32 year old black male  who has poorly controlled HIV and has a history of multiple abscesses the most recent involving his prostate which was drained by Dr. Marlou Porch.  He reports that about 1 month ago he noted a knot in the perineum.  He indicated that this was not tender and it really did not increase in size until about the past 24 hours since that time it is increased in size and become painful.  He has a history of multiple skin abscesses as well including axillary and scrotal abscesses.  He underwent a CT scan which revealed edema/inflammation of the posterior scrotum and perineum extending into the buttock region slightly but there was a single 4 cm area of abscess fluid located in the perineum just to the left of midline.  There was no evidence of gas within the soft tissue.  The patient has not noted any drainage from this.  It is tender to palpation. His perineal abscess would be considered a moderate severity with no modifying factors or other associated signs and symptoms.  Specifically no fever  Past Medical History:  Diagnosis Date  . 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 H/O coccidioidomycosis    pulmonary   . HIV disease (HCC)   . Past history of allergy to penicillin-type antibiotic 07-03-2012   desensitization  . PNA (pneumonia)   . Shigella gastroenteritis   . Syphilis    history /treated    Past Surgical History:  Procedure Laterality Date  . TRANSURETHRAL RESECTION OF PROSTATE N/A 12/09/2017  Procedure: TRANSURETHRAL RESECTION OF THE PROSTATE (TURP);  Surgeon: Crist FatHerrick, Benjamin W, MD;  Location: [facility];  Service: Urology;  Laterality: N/A;    Medications: Prior to Admission:  (Not in a hospital admission)  Allergies:  Allergies  Allergen Reactions  . Amoxicillin Other (See Comments)    From childhood: "I had a reaction when i was little." (??)  . Vancomycin Itching    Redness of arms    Family History  Problem Relation Age of Onset  .  Hypertension Mother   . Lupus Maternal Grandmother   . Cancer Paternal Grandfather     Social History:  reports that he has been smoking cigarettes. He has been smoking about 0.50 packs per day. He has never used smokeless tobacco. He reports that he drinks about 1.0 standard drinks of alcohol per week. He reports that he has current or past drug history. Drug: Marijuana. Frequency: 1.00 time per week.  Review of Systems (10 point): Pertinent items are noted in HPI. A comprehensive review of systems was negative except as noted above.  Physical Exam:  Vital signs in last 24 hours: Temp:  [98 F (36.7 C)-101.9 F (38.8 C)] 98 F (36.7 C) (11/16 0811) Pulse Rate:  [101-107] 101 (11/16 0811) Resp:  [15-23] 20 (11/16 0811) BP: (102-124)/(66-81) 124/74 (11/16 0811) SpO2:  [99 %-100 %] 99 % (11/16 0811) Weight:  [88 kg-88.5 kg] 88 kg (11/16 0809) General appearance: alert and appears stated age Head: Normocephalic, without obvious abnormality, atraumatic Eyes: conjunctivae/corneas clear. EOM's intact.  Oropharynx: moist mucous membranes Neck: supple, symmetrical, trachea midline Resp: normal respiratory effort Cardio: regular rate and rhythm Back: symmetric, no curvature. ROM normal. No CVA tenderness. GI: soft, non-tender; bowel sounds normal; no masses,  no organomegaly  Male genitalia: penis: normal male phallus with no lesions or discharge.Testes: bilaterally descended with no masses or tenderness. no hernias.  The scrotal skin was noted to have some edema but no fluctuance or my crepitus.  The perineum reveals tenderness and some fluctuance in the mid perineal region to the left of midline.  Extremities: extremities normal, atraumatic, no cyanosis or edema Skin: Skin color normal. No visible rashes or lesions Neurologic: Grossly normal  Laboratory Data:  Recent Labs    06/08/18 0943  WBC 3.3*  HGB 12.9*  HCT 39.8   BMET Recent Labs    06/08/18 0943  NA 130*  K 3.5    CL 99  CO2 21*  GLUCOSE 134*  BUN 14  CREATININE 0.74  CALCIUM 8.4*   No results for input(s): LABPT, INR in the last 72 hours. No results for input(s): LABURIN in the last 72 hours. Results for orders placed or performed during the hospital encounter of 06/08/18  Blood Culture (routine x 2)     Status: None (Preliminary result)   Collection Time: 06/08/18  9:28 AM  Result Value Ref Range Status   Specimen Description BLOOD BLOOD RIGHT FOREARM  Final   Special Requests   Final    BOTTLES DRAWN AEROBIC AND ANAEROBIC Blood Culture adequate volume   Culture   Final    NO GROWTH < 24 HOURS Performed at [facility], [address] [place], [place] [address]    Report Status PENDING  Incomplete  Blood Culture (routine x 2)     Status: None (Preliminary result)   Collection Time: 06/08/18  9:33 AM  Result Value Ref Range Status   Specimen Description BLOOD BLOOD LEFT HAND  Final  Special Requests   Final    BOTTLES DRAWN AEROBIC AND ANAEROBIC Blood Culture adequate volume   Culture   Final    NO GROWTH < 24 HOURS Performed at [facility], [address] [place], [place] [address]    Report Status PENDING  Incomplete   Creatinine: Recent Labs    06/08/18 0943  CREATININE 0.74    Imaging: Ct Pelvis W Contrast  Result Date: 06/08/2018 CLINICAL DATA:  CT Pelvis with contrast due to scrotal mass or lump Pt arrives to ED from with complaints of groin swelling around his perineum for the last 3 days. EMS reports pt had prostate surgery in May. EXAM: CT PELVIS WITH CONTRAST TECHNIQUE: Multidetector CT imaging of the pelvis was performed using the standard protocol following the bolus administration of intravenous contrast. CONTRAST:  OMNIPAQUE IOHEXOL 300 MG/ML  SOLN COMPARISON:  01/26/2018 FINDINGS: Urinary Tract: LOWER portion of the RIGHT kidney is unremarkable in appearance. Urinary bladder is normal in appearance. Bowel: There is diffuse mild rectal wall  thickening and perirectal stranding. Visualized bowel loops are unremarkable. The appendix is well seen and has a normal appearance. Vascular/Lymphatic: No significant vascular abnormality identified. Numerous probably reactive lymph nodes in the inguinal regions bilaterally, none measuring greater than 1.5 centimeters. Reproductive: The prostate is normal in size. By history, the patient has had TURP and prostatic abscess. There is edema of the spermatic cord bilaterally, increased since prior studies. There is marked subcutaneous edema of the perineum, significantly increased since prior study. Bilateral scrotal hydroceles are present. Within the wall of the posterior inferior LEFT hemiscrotum, there is a focal fluid collection which measures 2.0 x 3.7 x 4.5 centimeters, consistent with abscess. Other: No free pelvic fluid. Anterior abdominal wall is unremarkable. Musculoskeletal: No suspicious bone lesions identified. IMPRESSION: 1. Significant scrotal and perineal edema. 2. Interval development of an abscess in the posterior inferior LEFT scrotal wall, 4.5 centimeters in maximum diameter. 3. Improved appearance of the prostate gland. 4. Persistent mild thickening of the rectal wall, improved compared to prior studies. Electronically Signed   By: Norva Pavlov M.D.   On: 06/08/2018 14:32       Tatjana Turcott C 06/09/2018, 9:02 AM

## 2018-06-09 NOTE — Consult Note (Signed)
[facility]  Total days of antibiotics 2.cefepime and linezolid               Reason for Consult: scrotal abscess and hiv disease   Referring Physician: granfortuna  Active Problems:   Scrotal abscess    HPI: Jacob Gutierrez is a 32 y.o. male with poorly controlled HIV/AIDS, history of non compliance. His recent CD 4 count of 30/VL 95,100/ Nov 2019 currently on biktarvy. He has had numerous hospitalization in the last 6 months. Initially hospitalized in May 2019 for prostatic abscess s/p TURP procedure. Treated for enterococcus and prevotella with a dose of dalbavancin plus 6 wk of metronidazole. He followed up throughout the summer in the ID clinic still with intermittent adherence to his medications-but has stopped taking his ART in the last 2 months "they told me it didn't look like it was working"  He was admitted in early Oct for headache, ruled out CM, and discharged on Oct 7th to continue on biktarvy plus bactrim for OI prophylaxis. At that time, no signs of prostate abscess recurrence.   He was readmitted last night with complaint of  significant scrotal swelling and tenderness that occurred over the course of 1-2 days with febrile and tachycardic.  CT scan which revealed edema/inflammation of the posterior scrotum and perineum extending into the buttock region slightly but there was a single 4 cm area of abscess fluid located in the perineum just to the left of midline.  There was no evidence of gas within the soft tissue.  The patient has not noted any drainage from this.  It is tender to palpation. Dr Karsten Ro, from urology, did bedside I X D with  incision in the midline over the area of greatest fluctuance and noted thin, dark, reddish/brown fluid drain from the perineum. Culture's obtained and irrigated the wound copiously.  Digital exploration of the abscess cavity was then undertaken to be sure there were no loculations and to evaluate the extent of the  cavity. The cavitywas packed the wound with iodoform gauze, applied a sterile gauze dressing. His vitals showed temp of 101.61F, but now afebrile. He was started on linezolid plus cefepime (due to vancomycin allergy). He has hx of ESBL ecoli uti in 2018, but not further isolated in the last year. Blood cx ngtd at 24hr.   Past Medical History:  Diagnosis Date  . 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 H/O coccidioidomycosis    pulmonary   . HIV disease ([facility])   . Past history of allergy to penicillin-type antibiotic 07-03-2012   desensitization  . PNA (pneumonia)   . Shigella gastroenteritis   . Syphilis    history /treated     Allergies:  Allergies  Allergen Reactions  . Amoxicillin Other (See Comments)    From childhood: "I had a reaction when i was little." (??)  . Vancomycin Itching    Redness of arms    MEDICATIONS: . azithromycin  1,200 mg Oral Weekly  . enoxaparin (LOVENOX) injection  40 mg Subcutaneous Q24H  . sulfamethoxazole-trimethoprim  1 tablet Oral Daily    Social History   Tobacco Use  . Smoking status: Current Every Day Smoker    Packs/day: 0.50    Types: Cigarettes  . Smokeless tobacco: Never Used  . Tobacco comment: cutting back  Substance Use Topics  . Alcohol use: Yes    Alcohol/week: 1.0 standard drinks    Types: 1 Standard  drinks or equivalent per week    Comment: whiskey occasionally   . Drug use: Yes    Frequency: 1.0 times per week    Types: Marijuana    Family History  Problem Relation Age of Onset  . Hypertension Mother   . Lupus Maternal Grandmother   . Cancer Paternal Grandfather    Review of Systems  Constitutional: + for fever, chills, diaphoresis, activity change, appetite change, fatigue and unexpected weight change.  HENT: Negative for congestion, sore throat, rhinorrhea, sneezing, trouble swallowing and sinus pressure.  Eyes: Negative for photophobia and visual disturbance.  Respiratory:  Negative for cough, chest tightness, shortness of breath, wheezing and stridor.  Cardiovascular: Negative for chest pain, palpitations and leg swelling.  Gastrointestinal: Negative for nausea, vomiting, abdominal pain, diarrhea, constipation, blood in stool, abdominal distention and anal bleeding.  Genitourinary: +scrotal pain, swelling. Tenderness to the region. Negative for dysuria, hematuria, flank pain and difficulty urinating.  Musculoskeletal: Negative for myalgias, back pain, joint swelling, arthralgias and gait problem.  Skin: Negative for color change, pallor, rash and wound.  Neurological: Negative for dizziness, tremors, weakness and light-headedness.  Hematological: Negative for adenopathy. Does not bruise/bleed easily.  Psychiatric/Behavioral: Negative for behavioral problems, confusion, sleep disturbance, dysphoric mood, decreased concentration and agitation.     OBJECTIVE: Temp:  [98 F (36.7 C)-98.8 F (37.1 C)] 98.5 F (36.9 C) (11/16 1048) Pulse Rate:  [89-101] 93 (11/16 1048) Resp:  [20] 20 (11/16 1048) BP: (102-124)/(61-76) 112/70 (11/16 1048) SpO2:  [97 %-100 %] 99 % (11/16 1048) Weight:  [88 kg] 88 kg (11/16 0809) Physical Exam  Constitutional: He is oriented to person, place, and time. He appears well-developed and well-nourished. No distress.  HENT: bitemporal wasting Mouth/Throat: Oropharynx is clear and moist. No oropharyngeal exudate.  Cardiovascular: Normal rate, regular rhythm and normal heart sounds. Exam reveals no gallop and no friction rub.  No murmur heard.  Pulmonary/Chest: Effort normal and breath sounds normal. No respiratory distress. He has no wheezes.  Abdominal: Soft. Bowel sounds are normal. He exhibits no distension. There is no tenderness.  Lymphadenopathy:  He has no cervical adenopathy Gu: packed right scrotal region.  Neurological: He is alert and oriented to person, place, and time.  Skin: Skin is warm and dry. No rash noted. No  erythema.  Psychiatric: He has a normal mood and affect. His behavior is normal.     LABS: Results for orders placed or performed during the hospital encounter of 06/08/18 (from the past 48 hour(s))  Blood Culture (routine x 2)     Status: None (Preliminary result)   Collection Time: 06/08/18  9:28 AM  Result Value Ref Range   Specimen Description BLOOD BLOOD RIGHT FOREARM    Special Requests      BOTTLES DRAWN AEROBIC AND ANAEROBIC Blood Culture adequate volume   Culture      NO GROWTH < 24 HOURS Performed at [facility] [address] [place], [place] [address]    Report Status PENDING   Urinalysis, Routine w reflex microscopic     Status: Abnormal   Collection Time: 06/08/18  9:28 AM  Result Value Ref Range   Color, Urine YELLOW YELLOW   APPearance CLEAR CLEAR   Specific Gravity, Urine 1.025 1.005 - 1.030   pH 6.0 5.0 - 8.0   Glucose, UA NEGATIVE NEGATIVE mg/dL   Hgb urine dipstick MODERATE (A) NEGATIVE   Bilirubin Urine NEGATIVE NEGATIVE   Ketones, ur NEGATIVE NEGATIVE mg/dL   Protein, ur  30 (A) NEGATIVE mg/dL   Nitrite NEGATIVE NEGATIVE   Leukocytes, UA SMALL (A) NEGATIVE   RBC / HPF >50 (H) 0 - 5 RBC/hpf   WBC, UA 21-50 0 - 5 WBC/hpf   Bacteria, UA RARE (A) NONE SEEN   Squamous Epithelial / LPF 0-5 0 - 5   Mucus PRESENT    Non Squamous Epithelial 0-5 (A) NONE SEEN    Comment: Performed at [facility], [place] [address] [place], [place] [address]  Blood Culture (routine x 2)     Status: None (Preliminary result)   Collection Time: 06/08/18  9:33 AM  Result Value Ref Range   Specimen Description BLOOD BLOOD LEFT HAND    Special Requests      BOTTLES DRAWN AEROBIC AND ANAEROBIC Blood Culture adequate volume   Culture      NO GROWTH < 24 HOURS Performed at [facility], [place] [address], [place], [place] [address]    Report Status PENDING   Comprehensive metabolic panel     Status: Abnormal   Collection Time: 06/08/18  9:43 AM  Result Value Ref  Range   Sodium 130 (L) 135 - 145 mmol/L   Potassium 3.5 3.5 - 5.1 mmol/L   Chloride 99 98 - 111 mmol/L   CO2 21 (L) 22 - 32 mmol/L   Glucose, Bld 134 (H) 70 - 99 mg/dL   BUN 14 6 - 20 mg/dL   Creatinine, Ser 0.74 0.61 - 1.24 mg/dL   Calcium 8.4 (L) 8.9 - 10.3 mg/dL   Total Protein 7.8 6.5 - 8.1 g/dL   Albumin 3.2 (L) 3.5 - 5.0 g/dL   AST 25 15 - 41 U/L   ALT 14 0 - 44 U/L   Alkaline Phosphatase 69 38 - 126 U/L   Total Bilirubin 0.9 0.3 - 1.2 mg/dL   GFR calc non Af Amer >60 >60 mL/min   GFR calc Af Amer >60 >60 mL/min    Comment: (NOTE) The eGFR has been calculated using the CKD EPI equation. This calculation has not been validated in all clinical situations. eGFR's persistently <60 mL/min signify possible Chronic Kidney Disease.    Anion gap 10 5 - 15    Comment: Performed at [facility] [address] [place], [place] [address]  CBC WITH DIFFERENTIAL     Status: Abnormal   Collection Time: 06/08/18  9:43 AM  Result Value Ref Range   WBC 3.3 (L) 4.0 - 10.5 K/uL   RBC 4.26 4.22 - 5.81 MIL/uL   Hemoglobin 12.9 (L) 13.0 - 17.0 g/dL   HCT 39.8 39.0 - 52.0 %   MCV 93.4 80.0 - 100.0 fL   MCH 30.3 26.0 - 34.0 pg   MCHC 32.4 30.0 - 36.0 g/dL   RDW 13.4 11.5 - 15.5 %   Platelets 205 150 - 400 K/uL   nRBC 0.0 0.0 - 0.2 %   Neutrophils Relative % 67 %   Neutro Abs 2.2 1.7 - 7.7 K/uL   Lymphocytes Relative 16 %   Lymphs Abs 0.5 (L) 0.7 - 4.0 K/uL   Monocytes Relative 14 %   Monocytes Absolute 0.5 0.1 - 1.0 K/uL   Eosinophils Relative 1 %   Eosinophils Absolute 0.0 0.0 - 0.5 K/uL   Basophils Relative 0 %   Basophils Absolute 0.0 0.0 - 0.1 K/uL   WBC Morphology TOXIC GRANULATION     Comment: Performed at [facility], [place] [address]  [place], [place] [address]  HIV-1 RNA quant-no reflex-bld     Status: None   Collection Time: 06/08/18  9:43 AM  Result Value Ref Range   HIV 1 RNA Quant 95,100 copies/mL    Comment: (NOTE) The reportable range for this assay is  20 to 10,000,000 copies HIV-1 RNA/mL.    LOG10 HIV-1 RNA 4.978 log10copy/mL    Comment: (NOTE) Performed At: [facility] [place], [place] [id number] Rush Farmer MD JO:[id number]   I-Stat CG4 Lactic Acid, ED     Status: None   Collection Time: 06/08/18 10:01 AM  Result Value Ref Range   Lactic Acid, Venous 1.24 0.5 - 1.9 mmol/L  I-Stat CG4 Lactic Acid, ED     Status: None   Collection Time: 06/08/18 11:57 AM  Result Value Ref Range   Lactic Acid, Venous 0.78 0.5 - 1.9 mmol/L    MICRO: reviewed IMAGING: Ct Pelvis W Contrast  Result Date: 06/08/2018 CLINICAL DATA:  CT Pelvis with contrast due to scrotal mass or lump Pt arrives to ED from with complaints of groin swelling around his perineum for the last 3 days. EMS reports pt had prostate surgery in May. EXAM: CT PELVIS WITH CONTRAST TECHNIQUE: Multidetector CT imaging of the pelvis was performed using the standard protocol following the bolus administration of intravenous contrast. CONTRAST:  119m OMNIPAQUE IOHEXOL 300 MG/ML  SOLN COMPARISON:  01/26/2018 FINDINGS: Urinary Tract: LOWER portion of the RIGHT kidney is unremarkable in appearance. Urinary bladder is normal in appearance. Bowel: There is diffuse mild rectal wall thickening and perirectal stranding. Visualized bowel loops are unremarkable. The appendix is well seen and has a normal appearance. Vascular/Lymphatic: No significant vascular abnormality identified. Numerous probably reactive lymph nodes in the inguinal regions bilaterally, none measuring greater than 1.5 centimeters. Reproductive: The prostate is normal in size. By history, the patient has had TURP and prostatic abscess. There is edema of the spermatic cord bilaterally, increased since prior studies. There is marked subcutaneous edema of the perineum, significantly increased since prior study. Bilateral scrotal hydroceles are present. Within the wall of the posterior inferior LEFT hemiscrotum,  there is a focal fluid collection which measures 2.0 x 3.7 x 4.5 centimeters, consistent with abscess. Other: No free pelvic fluid. Anterior abdominal wall is unremarkable. Musculoskeletal: No suspicious bone lesions identified. IMPRESSION: 1. Significant scrotal and perineal edema. 2. Interval development of an abscess in the posterior inferior LEFT scrotal wall, 4.5 centimeters in maximum diameter. 3. Improved appearance of the prostate gland. 4. Persistent mild thickening of the rectal wall, improved compared to prior studies. Electronically Signed   By: ENolon NationsM.D.   On: 06/08/2018 14:32    HISTORICAL MICRO/IMAGING  Assessment/Plan:  89yoM with poorly controlled hiv disease, admitted for scrotal/peritoneal abscess s/p  I x D.  Scrotal/peritoneal abscess = continue with cefepime plus linezolid for now. Await sensitivities/ID from culture results.pending results will decide on what oral regimen can consider for him.  hiv disease= continue on biktarvy. Discussed that he needs to restart his ART and important to overall wellbeing.dispelled myth that it was not working. His genotype roughly 5 months ago suggests that it should still be active regimen for him  oi proph = recommend to continue with bactrim ds 1 tab daily plus azithromycin 12012mpo weekly

## 2018-06-09 NOTE — ED Notes (Signed)
Got patient undress on the monitor patient is resting with nurse at bedside and call bell in reach 

## 2018-06-09 NOTE — ED Provider Notes (Addendum)
[facility] EMERGENCY DEPARTMENT Provider Note   CSN: [id number] Arrival date & time: 06/09/18  0801     History   Chief Complaint Chief Complaint  Patient presents with  . Post-op Problem    scrotal abcess I&D yesterday in ER    HPI Jacob Gutierrez is a 32 y.o. male.  HPI Patient was seen here yesterday.  Had a scrotal abscess I indeed in the emergency department.  He was also seen by urology Dr. Vernie Ammons.  He was to be admitted to internal medicine service for intravenous antibiotics and to be followed by Dr. Vernie Ammons while in hospital.  He reports he had painful scrotal abscess for several days.  He returns today, he is willing to be admitted.  He complains of pain at incision site on scrotum.  He denies nausea or vomiting.  Denies further fever.  Denies shortness of breath.  No other associated symptoms nothing makes symptoms better or worse.  He is taken no medicine since leaving the hospital yesterday.  Linezolid and cefepime were recommended. Past Medical History:  Diagnosis Date  . 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 H/O coccidioidomycosis    pulmonary   . HIV disease ([facility])   . Past history of allergy to penicillin-type antibiotic 07-03-2012   desensitization  . PNA (pneumonia)   . Shigella gastroenteritis   . Syphilis    history /treated     Patient Active Problem List   Diagnosis Date Noted  . Fever 04/28/2018  . Headache 04/27/2018  . Condyloma 04/24/2018  . Weakness of both lower extremities   . Protein-calorie malnutrition, severe 12/12/2017  . Prostate abscess   . Personal history of MRSA (methicillin resistant Staphylococcus aureus) 12/09/2017  . History of ESBL E. coli infection 12/09/2017  . Dehydration   . Shigella infection   . Diarrhea 03/05/2017  . AIDS (acquired immune deficiency syndrome) ([facility])   . Proctitis   . Weight loss   . Homelessness   . 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  Syphilis contact, treated 01/02/2014  . Tobacco use disorder 01/02/2014  . Human immunodeficiency virus (HIV) disease ([facility]) 01/01/2014    Past Surgical History:  Procedure Laterality Date  . TRANSURETHRAL RESECTION OF PROSTATE N/A 12/09/2017   Procedure: TRANSURETHRAL RESECTION OF THE PROSTATE (TURP);  Surgeon: Crist Fat, MD;  Location: [facility];  Service: Urology;  Laterality: N/A;        Home Medications    Prior to Admission medications   Medication Sig Start Date End Date Taking? Authorizing Provider  acetaminophen (TYLENOL) 500 MG tablet Take 500-1,000 mg by mouth every 6 (six) hours as needed (for pain, fever, or headache).    [provider]  aspirin-acetaminophen-caffeine (EXCEDRIN MIGRAINE) [phone] MG tablet Take 2 tablets by mouth every 6 (six) hours as needed for headache.    [provider]  bictegravir-emtricitabine-tenofovir AF (BIKTARVY) 50-200-25 MG TABS tablet Take 1 tablet by mouth daily. Patient not taking: Reported on 06/08/2018 02/02/18   Daiva Eves, Lisette Grinder, MD  famotidine (PEPCID AC) 10 MG chewable tablet Chew 10 mg by mouth 2 (two) times daily as needed for heartburn.    [provider]  sulfamethoxazole-trimethoprim (BACTRIM DS,SEPTRA DS) 800-160 MG tablet Take 1 tablet by mouth daily. 02/02/18 06/08/18  Randall Hiss, MD    Family History Family History  Problem Relation Age of Onset  . Hypertension Mother   . Lupus Maternal Grandmother   .  Cancer Paternal Grandfather     Social History Social History   Tobacco Use  . Smoking status: Current Every Day Smoker    Packs/day: 0.50    Types: Cigarettes  . Smokeless tobacco: Never Used  . Tobacco comment: cutting back  Substance Use Topics  . Alcohol use: Yes    Alcohol/week: 1.0 standard drinks    Types: 1 Standard drinks or equivalent per week    Comment: whiskey occasionally   . Drug use: Yes    Frequency: 1.0 times per week    Types: Marijuana      Allergies   Amoxicillin and Vancomycin   Review of Systems Review of Systems  Genitourinary: Negative for difficulty urinating and dysuria.  Skin: Positive for wound.       Wound on scrotum  Allergic/Immunologic: Positive for immunocompromised state.  All other systems reviewed and are negative.    Physical Exam Updated Vital Signs BP 124/74 (BP Location: Right Arm)   Pulse (!) 101   Temp 98 F (36.7 C) (Oral)   Resp 20   Ht 6\' 1"  (1.854 m)   Wt 88 kg   SpO2 99%   BMI 25.60 kg/m   Physical Exam  Constitutional: He appears well-developed and well-nourished. No distress.  HENT:  Head: Normocephalic and atraumatic.  Eyes: Pupils are equal, round, and reactive to light. Conjunctivae are normal.  Neck: Neck supple. No tracheal deviation present. No thyromegaly present.  Cardiovascular: Regular rhythm.  No murmur heard. Mildly tachycardic  Pulmonary/Chest: Effort normal and breath sounds normal.  Abdominal: Soft. Bowel sounds are normal. He exhibits no distension. There is no tenderness.  Genitourinary: Penis normal.  Genitourinary Comments: Circumcised.  Scrotum scrotum with a packed wound, no surrounding redness.  There is some bloody drainage on the wound.  No active bleeding.  Musculoskeletal: Normal range of motion. He exhibits no edema or tenderness.  Neurological: He is alert. Coordination normal.  Skin: Skin is warm and dry. No rash noted.  Psychiatric: He has a normal mood and affect.  Nursing note and vitals reviewed.    ED Treatments / Results  Labs (all labs ordered are listed, but only abnormal results are displayed) Labs Reviewed - No data to display  EKG None  Radiology Ct Pelvis W Contrast  Result Date: 06/08/2018 CLINICAL DATA:  CT Pelvis with contrast due to scrotal mass or lump Pt arrives to ED from with complaints of groin swelling around his perineum for the last 3 days. EMS reports pt had prostate surgery in May. EXAM: CT PELVIS WITH  CONTRAST TECHNIQUE: Multidetector CT imaging of the pelvis was performed using the standard protocol following the bolus administration of intravenous contrast. CONTRAST:  OMNIPAQUE IOHEXOL 300 MG/ML  SOLN COMPARISON:  01/26/2018 FINDINGS: Urinary Tract: LOWER portion of the RIGHT kidney is unremarkable in appearance. Urinary bladder is normal in appearance. Bowel: There is diffuse mild rectal wall thickening and perirectal stranding. Visualized bowel loops are unremarkable. The appendix is well seen and has a normal appearance. Vascular/Lymphatic: No significant vascular abnormality identified. Numerous probably reactive lymph nodes in the inguinal regions bilaterally, none measuring greater than 1.5 centimeters. Reproductive: The prostate is normal in size. By history, the patient has had TURP and prostatic abscess. There is edema of the spermatic cord bilaterally, increased since prior studies. There is marked subcutaneous edema of the perineum, significantly increased since prior study. Bilateral scrotal hydroceles are present. Within the wall of the posterior inferior LEFT hemiscrotum, there is a focal  fluid collection which measures 2.0 x 3.7 x 4.5 centimeters, consistent with abscess. Other: No free pelvic fluid. Anterior abdominal wall is unremarkable. Musculoskeletal: No suspicious bone lesions identified. IMPRESSION: 1. Significant scrotal and perineal edema. 2. Interval development of an abscess in the posterior inferior LEFT scrotal wall, 4.5 centimeters in maximum diameter. 3. Improved appearance of the prostate gland. 4. Persistent mild thickening of the rectal wall, improved compared to prior studies. Electronically Signed   By: Norva PavlovElizabeth  Brown M.D.   On: 06/08/2018 14:32    Procedures Procedures (including critical care time)  Medications Ordered in ED Medications  morphine 4 MG/ML injection 4 mg (has no administration in time range)  linezolid (ZYVOX) IVPB 600 mg (has no  administration in time range)     Initial Impression / Assessment and Plan / ED Course  I have reviewed the triage vital signs and the nursing notes.  Pertinent labs & imaging results that were available during my care of the patient were reviewed by me and considered in my medical decision making (see chart for details).     Dr. Vernie Ammonsttelin from urology service consulted and will evaluate patient in the hospital.  I also consulted internal medicine resident physician who will arrange for admission. Lab work from yesterday reviewed.  Blood cultures negative after 1 day.  No further diagnostic testing ordered by me.  IV antibiotics and IV morphine ordered by me 9:15 AM pain improved after treatment with intravenous morphine Final Clinical Impressions(s) / ED Diagnoses  Diagnosis scrotal abscess Final diagnoses:  None    ED Discharge Orders    None       Doug SouJacubowitz, Larena Ohnemus, MD 06/09/18 [id number]    Doug SouJacubowitz, Jenika Chiem, MD 06/09/18 [phone]

## 2018-06-09 NOTE — Progress Notes (Signed)
Pharmacy Antibiotic Note  Jacob Gutierrez is a 32 y.o. male admitted on 06/09/2018 with scrotal abscess.  Pharmacy has been consulted for Cefepime dosing.  CC/HPI: [facility] for scrotal abcess that was I&D'd and packed. 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 coli infection and MRSA 5/19, PCM, condyloma   ID: Scrotal abscess I&D's on 11/15.. Tmax 101.9. LA WNL, WBC 3.3 Zyvox IV x 1 in ED 11/16 Cefepime 11/16>>  11/15: Abscess culture 11/15: BC>>  Plan: Cefepime 2g IV q 12 hrs    Height: 6\' 1"  (185.4 cm) Weight: 194 lb 0.1 oz (88 kg) IBW/kg (Calculated) : 79.9  Temp (24hrs), Avg:100.1 F (37.8 C), Min:98 F (36.7 C), Max:101.9 F (38.8 C)  Recent Labs  Lab 06/08/18 0943 06/08/18 1001 06/08/18 1157  WBC 3.3*  --   --   CREATININE 0.74  --   --   LATICACIDVEN  --  1.24 0.78    Estimated Creatinine Clearance: 147 mL/min (by C-G formula based on SCr of 0.74 mg/dL).    Allergies  Allergen Reactions  . Amoxicillin Other (See Comments)    From childhood: "I had a reaction when i was little." (??)  . Vancomycin Itching    Redness of arms   Inetta Dicke S. Merilynn Finlandobertson, PharmD, BCPS Clinical Staff Pharmacist  Misty StanleyRobertson, Kasi Lasky [facility] 06/09/2018 8:38 AM

## 2018-06-09 NOTE — ED Triage Notes (Signed)
[facility] for scrotal abcess that was I&D'd and packed. Pt was supposed to stay for admission for IV abx, however, pt was adamant to leave due to eviction issues and needing to get his things from his residence. Pt returned today because he was told yesterday that he should be admitted.

## 2018-06-09 NOTE — H&P (Signed)
Date: 06/09/2018               Patient Name:  Jacob Gutierrez MRN: [id number]  DOB: 07-30-1985 Age / Sex: 32 y.o., male   PCP: Grayce SessionsEdwards, Michelle P, NP         Medical Service: Internal Medicine Teaching Service         Attending Physician: Dr. Levert FeinsteinGranfortuna, James M, MD    First Contact: Dr. Criss AlvinePrince  Pager: [phone]  Second Contact: Dr. Caron PresumeHelberg Pager: [phone]       After Hours (After 5p/  First Contact Pager: [phone]  weekends / holidays): Second Contact Pager: [phone]   Chief Complaint: Scrotal Pain  History of Present Illness: Shepard GeneralDesmond Gutierrez is a 32 y.o male with AIDS and previous admission from 12/09/17-12/15/2017 for a prostate abscess and prolonged antibiotic needs who presented to the ED with scrotal pain and swelling for 1-2 days duration. He first noticed a small abscess developing in his peritoneum approximately 1 month prior. It subsequently spread to involve his scrotum. He was evaluated in the ED yesterday for I&D but left due to issues with his living situation. He planned to come back last night but was unable to get a ride to the hospital. He came in today to be admitted for IV antibiotics. Associated symptoms include pain, swelling, and ?pneumoturia. He has not noticed any hematuria, feelings of retention,   He does not take his HIV medications because he does not feel they work. He does follow with infectious disease. He is sexually active with males and females. Typically engages in vaginal and anal intercourse. Has not been sexually active since May. Denies fevers, chills, abdominal pain, diarrhea, constipation, myalgias, arthralgias, CP, SOB.   Meds:  No current facility-administered medications on file prior to encounter.    Current Outpatient Medications on File Prior to Encounter  Medication Sig Dispense Refill  . acetaminophen (TYLENOL) 500 MG tablet Take 500-1,000 mg by mouth every 6 (six) hours as needed (for pain, fever, or headache).    Marland Kitchen.  aspirin-acetaminophen-caffeine (EXCEDRIN MIGRAINE) 250-250-65 MG tablet Take 2 tablets by mouth every 6 (six) hours as needed for headache.    . bictegravir-emtricitabine-tenofovir AF (BIKTARVY) 50-200-25 MG TABS tablet Take 1 tablet by mouth daily. (Patient not taking: Reported on 06/08/2018) 30 tablet 5  . famotidine (PEPCID AC) 10 MG chewable tablet Chew 10 mg by mouth 2 (two) times daily as needed for heartburn.    . sulfamethoxazole-trimethoprim (BACTRIM DS,SEPTRA DS) 800-160 MG tablet Take 1 tablet by mouth daily. 30 tablet 3   Allergies: Allergies as of 06/09/2018 - Review Complete 06/09/2018  Allergen Reaction Noted  . Amoxicillin Other (See Comments) 12/08/2013  . Vancomycin Itching 04/27/2018   Past Medical History:  Diagnosis Date  . 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 H/O coccidioidomycosis    pulmonary   . HIV disease (HCC)   . Past history of allergy to penicillin-type antibiotic 07-03-2012   desensitization  . PNA (pneumonia)   . Shigella gastroenteritis   . 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   Physical Exam: Blood pressure 111/74, pulse 89, temperature 98 F (36.7  C), temperature source Oral, resp. rate 20, height 6\' 1"  (1.854 m), weight 88 kg, SpO2 100 %.  General: Well nourished male in no acute distress HENT: Normocephalic, atraumatic, moist mucus membranes Pulm: Good air movement with no wheezing or crackles  CV: RRR, no murmurs, no rubs  Abdomen: Active bowel sounds, soft, non-distended, no tenderness to palpation  GU: Diffuse erythema and edema of the pelvis, scrotum, and peritoneum. Open packed incision in the peritoneum. No crepitus. No fluctuating masses.    Extremities: Pulses palpable in all extremities, no LE edema  Skin: Warm and dry  Neuro: Alert and oriented x 3  CT Abdomen and Pelvis  1. Significant scrotal and perineal edema. 2. Interval development of an abscess in the posterior inferior LEFT scrotal wall, 4.5 centimeters in maximum diameter. 3. Improved appearance of the prostate gland. 4. Persistent mild thickening of the rectal wall, improved compared to prior studies.  Assessment & Plan by Problem: Active Problems:   Scrotal abscess  Mr. Hepburn is a 32 year old male with poorly controlled HIV who is being admitted for scrotal/peritoneal abscess status post I&D.  CT pelvis performed yesterday did not show air in the subcutaneous tissue.  Will start antibiotic treatment and await urology recommendations.  Scrotal/peritoneal abscess: There are no signs of spread into surrounding subcutaneous tissue.  Infectious disease consulted and recommended cefepime plus linezolid for now (due to allergy to vancomycin).   1.  Ordered cefepime per pharmacy and Linezolid 600 mg q12h 2.  Await culture sensitivities/ID  HIV: He has not taken his Biktarvy in over several months.  We will hold this medication for now. Will give Azithromycin and Bactrim for opportunistic infection prophylaxis. 1.  Hold Biktarvy 2. Ordered azithromycin 1200 mg QD and Bactrim 800-160 mg QD  DVT PPX: Enoxaparin 40 mg subq QD  Dispo: Admit patient to Inpatient with expected length of stay greater than 2 midnights.  Signed: Synetta Shadow, MD 06/09/2018, 10:23 AM

## 2018-06-10 DIAGNOSIS — B9561 Methicillin susceptible Staphylococcus aureus infection as the cause of diseases classified elsewhere: Secondary | ICD-10-CM

## 2018-06-10 DIAGNOSIS — R300 Dysuria: Secondary | ICD-10-CM

## 2018-06-10 DIAGNOSIS — Z22322 Carrier or suspected carrier of Methicillin resistant Staphylococcus aureus: Secondary | ICD-10-CM

## 2018-06-10 LAB — CBC
HCT: 38.5 % — ABNORMAL LOW (ref 39.0–52.0)
Hemoglobin: 12.8 g/dL — ABNORMAL LOW (ref 13.0–17.0)
MCH: 30.8 pg (ref 26.0–34.0)
MCHC: 33.2 g/dL (ref 30.0–36.0)
MCV: 92.5 fL (ref 80.0–100.0)
PLATELETS: 168 10*3/uL (ref 150–400)
RBC: 4.16 MIL/uL — AB (ref 4.22–5.81)
RDW: 13.3 % (ref 11.5–15.5)
WBC: 2.2 10*3/uL — ABNORMAL LOW (ref 4.0–10.5)
nRBC: 0 % (ref 0.0–0.2)

## 2018-06-10 LAB — BASIC METABOLIC PANEL
Anion gap: 8 (ref 5–15)
BUN: 9 mg/dL (ref 6–20)
CHLORIDE: 103 mmol/L (ref 98–111)
CO2: 23 mmol/L (ref 22–32)
CREATININE: 0.75 mg/dL (ref 0.61–1.24)
Calcium: 8.5 mg/dL — ABNORMAL LOW (ref 8.9–10.3)
GFR calc Af Amer: >60 mL/min (ref >60)
GFR calc non Af Amer: >60 mL/min (ref >60)
GLUCOSE: 91 mg/dL (ref 70–99)
Potassium: 4.1 mmol/L (ref 3.5–5.1)
Sodium: 134 mmol/L — ABNORMAL LOW (ref 135–145)

## 2018-06-10 NOTE — Progress Notes (Signed)
Patient ID: Jacob Gutierrez, male   DOB: Aug 03, 1985, 32 y.o.   MRN: [id number]    Assessment: Perineal abscess: He remains on broad-spectrum antibiotics with no fever.  His exam reveals improved induration of his scrotum and perineal region.  The wound appears well drained with dressing in place.  Will begin daily dressing changes and wound irrigation.  The wound culture remains pending at this time.  Plan:  1.  Begin daily dressing change with wound irrigation. 2.  Continue broad-spectrum antibiotics while awaiting culture results from the abscess.   Subjective: Patient reports that he is not having any significant pain in the perineum currently.  Objective: Vital signs in last 24 hours: Temp:  [98.1 F (36.7 C)-99.2 F (37.3 C)] 98.1 F (36.7 C) (11/17 0923) Pulse Rate:  [75-98] 80 (11/17 0923) Resp:  [18-20] 19 (11/17 0923) BP: (103-115)/(61-74) 112/61 (11/17 0923) SpO2:  [99 %-100 %] 100 % (11/17 0923) Weight:  [88.1 kg] 88.1 kg (11/16 2046)A  Intake/Output from previous day: 11/16 0701 - 11/17 0700 In: 680 [P.O.:480; IV Piggyback:200] Out: 825 [Urine:825] Intake/Output this shift: No intake/output data recorded.  Past Medical History:  Diagnosis Date  . 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 H/O coccidioidomycosis    pulmonary   . HIV disease (HCC)   . Past history of allergy to penicillin-type antibiotic 07-03-2012   desensitization  . PNA (pneumonia)   . Shigella gastroenteritis   . Syphilis    history /treated     Physical Exam:  General: Awake, alert and in no apparent distress Lungs: Normal respiratory effort, chest expands symmetrically.  Abdomen: Soft, non-tender & non-distended. GU: His scrotum is softer with less induration.  There is no fluctuance or crepitus.  His perineal wound looks good with packing in place and no significant drainage.  There is less perineal induration noted as well.  Lab Results: Recent  Labs    06/08/18 0943  WBC 3.3*  HGB 12.9*  HCT 39.8   BMET Recent Labs    06/08/18 0943 06/10/18 0426  NA 130* 134*  K 3.5 4.1  CL 99 103  CO2 21* 23  GLUCOSE 134* 91  BUN 14 9  CREATININE 0.74 0.75  CALCIUM 8.4* 8.5*   No results for input(s): LABURIN in the last 72 hours. Results for orders placed or performed during the hospital encounter of 06/09/18  MRSA PCR Screening     Status: Abnormal   Collection Time: 06/09/18  3:56 PM  Result Value Ref Range Status   MRSA by PCR POSITIVE (A) NEGATIVE Final    Comment:        The GeneXpert MRSA Assay (FDA approved for NASAL specimens only), is one component of a comprehensive MRSA colonization surveillance program. It is not intended to diagnose MRSA infection nor to guide or monitor treatment for MRSA infections. RESULT CALLED TO, READ BACK BY AND VERIFIED WITH: A RIO RN [phone] BY GF Performed at [facility], [address] [place], [place] [address]     Studies/Results: No results found.    Nijah Orlich C 06/10/2018, 9:41 AM

## 2018-06-10 NOTE — Progress Notes (Signed)
   Subjective: Mr. Ebony CargoClayton reports several episodes of dysuria but denies hematuria, fevers, back pain.  He states that his testicular pain is about the same from yesterday.  Objective:  Vital signs in last 24 hours: Vitals:   06/09/18 1000 06/09/18 1048 06/09/18 1637 06/09/18 2046  BP: 111/74 112/70 115/69 104/68  Pulse: 89 93 83 83  Resp:  20 18 18   Temp:  98.5 F (36.9 C) 98.7 F (37.1 C) 99.2 F (37.3 C)  TempSrc:  Oral Oral Oral  SpO2: 100% 99% 100% 100%  Weight:    88.1 kg  Height:       Physical Exam  Constitutional: He appears well-developed and well-nourished.  HENT:  Head: Normocephalic and atraumatic.  Eyes: Conjunctivae and EOM are normal.  Neck: Normal range of motion. Neck supple.  Cardiovascular: Normal rate, regular rhythm and normal heart sounds.  Pulmonary/Chest: Effort normal and breath sounds normal.  Abdominal: Soft. Bowel sounds are normal. There is no tenderness.  Genitourinary:  Genitourinary Comments: Severely enlarged scrotum with overlying erythema. Packing in place at I&D site with purulent drainage.   Musculoskeletal: He exhibits no edema or tenderness.  Neurological: He is alert.  Psychiatric: He has a normal mood and affect. His behavior is normal.  Nursing note and vitals reviewed.   Assessment/Plan:  Active Problems:   Scrotal abscess  Mr. Ebony CargoClayton is a 32 year old male with poorly controlled HIV who presented with scrotal/peritoneal abscess status post I&D, now on day 2 cefepime and Linezolid. Urology consulted and recommends daily dressing change with wound irrigation and continued broad-spectrum antibiotics.   Scrotal/peritoneal abscess: 1. Continue cefepime 2 g q12h and Linezolid 600 mg q12h 2. Daily dressing changes with wound irrigation 3.  Await culture sensitivities/ID  HIV:   1. Biktarvy restarted per ID recommendations 2. Continue azithromycin 1200 mg QD and Bactrim 800-160 mg QD  Dysuria: Maybe a urinary tract infection  which would be covered by his current antibiotic treatment. 1. Continue to monitor  Dispo: Anticipated discharge in 3-5 days pending culture sensitivities.  Synetta ShadowPrince, Cleophus Mendonsa M, MD 06/10/2018, 6:14 AM Pager: [phone]

## 2018-06-11 ENCOUNTER — Ambulatory Visit: Payer: Medicaid Other | Admitting: Infectious Disease

## 2018-06-11 LAB — CBC
HCT: 40.1 % (ref 39.0–52.0)
Hemoglobin: 13.5 g/dL (ref 13.0–17.0)
MCH: 31.1 pg (ref 26.0–34.0)
MCHC: 33.7 g/dL (ref 30.0–36.0)
MCV: 92.4 fL (ref 80.0–100.0)
NRBC: 0 % (ref 0.0–0.2)
PLATELETS: 153 10*3/uL (ref 150–400)
RBC: 4.34 MIL/uL (ref 4.22–5.81)
RDW: 13.3 % (ref 11.5–15.5)
WBC: 2.2 10*3/uL — AB (ref 4.0–10.5)

## 2018-06-11 MED ORDER — AZITHROMYCIN 600 MG PO TABS: 1200.0000 mg | ORAL_TABLET | ORAL | 0 refills | Status: DC

## 2018-06-11 MED ORDER — LINEZOLID 600 MG PO TABS
600.0000 mg | ORAL_TABLET | Freq: Two times a day (BID) | ORAL | Status: DC
  Administered 2018-06-11 – 2018-06-12 (×2): 600 mg via ORAL
  Filled 2018-06-11 (×2): qty 1

## 2018-06-11 MED ORDER — BICTEGRAVIR-EMTRICITAB-TENOFOV 50-200-25 MG PO TABS: 1.0000 | ORAL_TABLET | Freq: Every day | ORAL | 5 refills | Status: DC

## 2018-06-11 MED ORDER — SULFAMETHOXAZOLE-TRIMETHOPRIM 800-160 MG PO TABS: 1.0000 | ORAL_TABLET | Freq: Two times a day (BID) | ORAL | 0 refills | Status: DC

## 2018-06-11 MED ORDER — SULFAMETHOXAZOLE-TRIMETHOPRIM 800-160 MG PO TABS: 1.0000 | ORAL_TABLET | Freq: Every day | ORAL | 3 refills | Status: DC

## 2018-06-11 NOTE — Progress Notes (Signed)
   Subjective: Jacob Gutierrez reports no new complaints.  He states that his scrotal pain similar to yesterday.  He does state that he has had has had several "chilly spells "but denies fevers.  Denies any shortness of breath, chest pain, abdominal pain, dysuria, and hematuria.  Objective:  Vital signs in last 24 hours: Vitals:   06/10/18 0634 06/10/18 0923 06/10/18 1634 06/10/18 2101  BP: 110/72 112/61 110/70 116/72  Pulse: 75 80 74 78  Resp: 18 19 18 16   Temp: 98.8 F (37.1 C) 98.1 F (36.7 C) 98.5 F (36.9 C) 98.8 F (37.1 C)  TempSrc: Oral Oral Oral Oral  SpO2: 100% 100% 100% 100%  Weight:      Height:       Physical Exam  Constitutional: He appears well-developed and well-nourished.  HENT:  Head: Normocephalic and atraumatic.  Eyes: EOM are normal.  Cardiovascular: Normal rate, regular rhythm and normal heart sounds.  Pulmonary/Chest: Effort normal and breath sounds normal.  Abdominal: Soft. Bowel sounds are normal.  Genitourinary:  Genitourinary Comments: Enlarged scrotum with swelling (decreased swelling from yesterday's exam), small open wound at the perineum with mild purulent drainage.  Musculoskeletal: He exhibits no edema or tenderness.  Neurological: He is alert.  Skin: Skin is warm and dry.  Psychiatric: He has a normal mood and affect. His behavior is normal.  Nursing note and vitals reviewed.   Assessment/Plan:  Principal Problem:   Scrotal abscess Active Problems:   Human immunodeficiency virus (HIV) disease (HCC)   AIDS (acquired immune deficiency syndrome) [facility](HCC)  Jacob Gutierrez is a 32 year old male with poorly controlled HIV now on Biktarvy who presented with scrotal/peritoneal abscess a/p I&D, on day 4 cefepime/ Linezolid and daily dressing change with wound irrigation.  He mains afebrile and scrotal swelling has improved since yesterday.  Wound culture grew staph aureus. Susceptibilities pending.  Will plan to switch his antibiotics to oral medications  pending susceptibilities.  Scrotal/peritoneal abscess: 1. Continue cefepime 2 g q12h and Linezolid600 mg q12h. Will switch to all oral medications pending susceptibilities. 2. Daily dressing changes with wound irrigation  HIV:  1. Continue Biktarvy 2. Continue azithromycin 1200 mg QD and Bactrim 800-160 mg QD  Dysuria: Resolved  Dispo: Anticipated discharge  within 1 to 2 days pending culture susceptibilities.  Synetta ShadowPrince, Jamie M, MD 06/11/2018, 6:24 AM Pager: [phone]

## 2018-06-11 NOTE — Progress Notes (Addendum)
    [facility]    Date of Admission:  06/09/2018   Total days of antibiotics 4          ID: Jacob Gutierrez is a 32 y.o. male with scrotal abscess s/p I x D Principal Problem:   Scrotal abscess Active Problems:   Human immunodeficiency virus (HIV) disease (HCC)   AIDS (acquired immune deficiency syndrome) (HCC)    Subjective: Feeling better, less discomfort in region of packing from I x d.  Medications:  . azithromycin  1,200 mg Oral Weekly  . bictegravir-emtricitabine-tenofovir AF  1 tablet Oral Daily  . Chlorhexidine Gluconate Cloth  6 each Topical Q0600  . enoxaparin (LOVENOX) injection  40 mg Subcutaneous Q24H  . linezolid  600 mg Oral Q12H  . mupirocin ointment  1 application Nasal BID  . sulfamethoxazole-trimethoprim  1 tablet Oral Daily    Objective: Vital signs in last 24 hours: Temp:  [97.9 F (36.6 C)-98.8 F (37.1 C)] 97.9 F (36.6 C) (11/18 0924) Pulse Rate:  [64-78] 64 (11/18 0924) Resp:  [16-18] 18 (11/18 0924) BP: (116-120)/(72-83) 120/83 (11/18 0924) SpO2:  [100 %] 100 % (11/18 16100924) Physical Exam  Constitutional: He is oriented to person, place, and time. He appears well-developed and well-nourished. No distress.  HENT:  Mouth/Throat: Oropharynx is clear and moist. No oropharyngeal exudate.  Cardiovascular: Normal rate, regular rhythm and normal heart sounds. Exam reveals no gallop and no friction rub.  No murmur heard.  Pulmonary/Chest: Effort normal and breath sounds normal. No respiratory distress. He has no wheezes.  Abdominal: Soft. Bowel sounds are normal. He exhibits no distension. There is no tenderness.  Lymphadenopathy:  He has no cervical adenopathy.  Neurological: He is alert and oriented to person, place, and time.  Skin: Skin is warm and dry. No rash noted. No erythema.  Psychiatric: He has a normal mood and affect. His behavior is normal.     Lab Results Recent Labs    06/10/18 0426 06/10/18 1349  06/11/18 0622  WBC  --  2.2* 2.2*  HGB  --  12.8* 13.5  HCT  --  38.5* 40.1  NA 134*  --   --   K 4.1  --   --   CL 103  --   --   CO2 23  --   --   BUN 9  --   --   CREATININE 0.75  --   --     Microbiology: mrsa on anaerobic swab from I x D (unable to get sensi) Studies/Results: No results found.   Assessment/Plan: hiv disease = restart taking biktarvy daily. Readdressed importance of adherence  oi proph = bactrim ds daily. Plus azithromycin 1200mg  weekly  Scrotal abscess/cellulitis = currently on cefepime and linezolid. mrsa isolated only. Will discharge on bactrim ds 1 bid x 10d tomorrow. Finish up iv abtx cefepime today. Continue to follow urology recs for wound management  Will see back in ID clinic in 1-2 wk.  [facility] Cell: [phone] Pager: [phone]  06/11/2018, 5:42 PM

## 2018-06-11 NOTE — Discharge Summary (Addendum)
Name: Jacob Gutierrez MRN: [id number] DOB: 1986/06/02 32 y.o. PCP: Grayce SessionsEdwards, Michelle P, NP  Date of Admission: 06/09/2018  8:02 AM Date of Discharge: 06/11/18 Attending Physician: Earl LagosNarendra, Nischal, MD  Discharge Diagnosis: 1. Scrotal/perineal abscess 2. HIV  Discharge Medications: Allergies as of 06/11/2018      Reactions   Amoxicillin Other (See Comments)   From childhood: "I had a reaction when i was little." (??)   Vancomycin Itching   Angioedema      Medication List    TAKE these medications   acetaminophen 500 MG tablet Commonly known as:  TYLENOL Take 500-1,000 mg by mouth every 6 (six) hours as needed (for pain, fever, or headache).   aspirin-acetaminophen-caffeine 250-250-65 MG tablet Commonly known as:  EXCEDRIN MIGRAINE Take 2 tablets by mouth every 6 (six) hours as needed for headache.   azithromycin 600 MG tablet Commonly known as:  ZITHROMAX Take 2 tablets (1,200 mg total) by mouth once a week. Start taking on:  06/16/2018   bictegravir-emtricitabine-tenofovir AF 50-200-25 MG Tabs tablet Commonly known as:  BIKTARVY Take 1 tablet by mouth daily.   famotidine 10 MG chewable tablet Commonly known as:  PEPCID AC Chew 10 mg by mouth 2 (two) times daily as needed for heartburn.   sulfamethoxazole-trimethoprim 800-160 MG tablet Commonly known as:  BACTRIM DS,SEPTRA DS Take 1 tablet by mouth 2 (two) times daily for 10 days. What changed:  You were already taking a medication with the same name, and this prescription was added. Make sure you understand how and when to take each.   sulfamethoxazole-trimethoprim 800-160 MG tablet Commonly known as:  BACTRIM DS,SEPTRA DS Take 1 tablet by mouth daily. Start taking on:  06/22/2018 What changed:  These instructions start on 06/22/2018. If you are unsure what to do until then, ask your doctor or other care provider.       Disposition and follow-up:   JacobJacob Gutier[facility] St[facility].  At the hospital follow up visit please address:  1.  Please monitor scrotal abscess as it continues to heal while on antibiotic treatment. Also ensure that [name] continues to take his antiretroviral Biktarvy.   2.  Labs / imaging needed at time of follow-up: CD4/HIV quant  3.  Pending labs/ test needing follow-up: None  Follow-up Appointments: Follow-up Information    Daiva EvesVan Dam, Lisette Grinderornelius N, MD Follow up.   Specialty:  Infectious Diseases Why:  12/2 @ 10am. Please call the office to reschedule if you are not able to make this appointment.  Contact information: [address] [place] [address] [phone]        [facility] Follow up on 06/19/2018.   Why:  @12 :15pm Contact information: [address] [place] [id number] [phone]          Hospital Course by problem list: 1. Scrotal/perineal abscess: [name] presented with increasing pain in his perineal area and on initial presentation to the ED he had a fever of 101.9 in the face of chronic leukopenia.  He was found to have a 4.5 cm abscess left posterior inferior scrotal wall with surrounding scrotal and perineal edema.  He underwent incision and drainage procedure by urology and wound was packed. There was no evidence of Fournier's gangrene.  Broad-spectrum antibiotics initiated (Linezolid and Cefepime). His scrotal swelling improved significantly and there was minimal discharge from the wound. Wound cultures eventually grew staph aureus.He was  discharged in stable condition on Bactrim 800-160 mg QD for an additional 10 days of therapy.  2. HIV: Jacob Gutierrez became discouraged when his last viral load recorded on April 24, 2018 was elevated and he stopped taking his antiretroviral medication.  HIV genotype done in July did not show any obvious drug resistant mutants. HIV quant was rechecked upon admission and was 95,100. Biktarvy was  restarted during admission. He was also placed on Azithromycin and Bactrim for opportunistic infection prevention. These antibiotic were continued post-discharge.   Discharge Vitals:   BP 120/83 (BP Location: Left Arm)   Pulse 64   Temp 97.9 F (36.6 C) (Oral)   Resp 18   Ht 6\' 1"  (1.854 m)   Wt 88.1 kg   SpO2 100%   BMI 25.62 kg/m   Pertinent Labs, Studies, and Procedures:  CBC Latest Ref Rng & Units 06/11/2018 06/10/2018 06/08/2018  WBC 4.0 - 10.5 K/uL 2.2(L) 2.2(L) 3.3(L)  Hemoglobin 13.0 - 17.0 g/dL 16.1 12.8(L) 12.9(L)  Hematocrit 39.0 - 52.0 % 40.1 38.5(L) 39.8  Platelets 150 - 400 K/uL 153 168 205   Lab Results  Component Value Date   HIV1RNAQUANT 95,100 06/08/2018   Lab Results  Component Value Date   CD4TABS 30 (L) 04/24/2018   CD4TABS 60 (L) 02/02/2018   CD4TABS 200 (L) 12/17/2013   11/15 CT Pelvis: IMPRESSION: 1. Significant scrotal and perineal edema. 2. Interval development of an abscess in the posterior inferior LEFT scrotal wall, 4.5 centimeters in maximum diameter. 3. Improved appearance of the prostate gland. 4. Persistent mild thickening of the rectal wall, improved compared to prior studies.  Discharge Instructions: Discharge Instructions    Call MD for:  persistant nausea and vomiting   Complete by:  As directed    Call MD for:  redness, tenderness, or signs of infection (pain, swelling, redness, odor or green/yellow discharge around incision site)   Complete by:  As directed    Call MD for:  temperature >100.4   Complete by:  As directed    Diet - low sodium heart healthy   Complete by:  As directed    Increase activity slowly   Complete by:  As directed       Signed: Synetta Shadow, MD 06/11/2018, 4:10 PM

## 2018-06-11 NOTE — Progress Notes (Signed)
When patient was informed of being discharged, patient stated that he does not have a ride and a place to stay. Dr. Chesley MiresBloomfield was notified and made aware. Patient stated that he was to continue to find a ride and place to stay upon discharge but could not be done until the morning.MD stated, that they will discontinue the discharge order and reevaluate in the morning. Will continue to encourage the patient to find a ride.

## 2018-06-11 NOTE — Discharge Instructions (Signed)
Thank you for allowing us to provide your care. It is important that you follow-up with urology, infectious disease, and your primary care doctor. Please take all you medications as prescribed.

## 2018-06-12 DIAGNOSIS — Z590 Homelessness: Secondary | ICD-10-CM

## 2018-06-12 LAB — CBC WITH DIFFERENTIAL/PLATELET
Basophils Absolute: 0 10*3/uL (ref 0.0–0.1)
Basophils Relative: 2 %
EOS PCT: 13 %
Eosinophils Absolute: 0.3 10*3/uL (ref 0.0–0.5)
HCT: 41.9 % (ref 39.0–52.0)
Hemoglobin: 13.5 g/dL (ref 13.0–17.0)
LYMPHS PCT: 36 %
Lymphs Abs: 0.8 10*3/uL (ref 0.7–4.0)
MCH: 29.5 pg (ref 26.0–34.0)
MCHC: 32.2 g/dL (ref 30.0–36.0)
MCV: 91.7 fL (ref 80.0–100.0)
MONO ABS: 0.3 10*3/uL (ref 0.1–1.0)
MONOS PCT: 12 %
NRBC: 0 /100{WBCs}
Neutro Abs: 0.8 10*3/uL — ABNORMAL LOW (ref 1.7–7.7)
Neutrophils Relative %: 37 %
PLATELETS: 244 10*3/uL (ref 150–400)
RBC: 4.57 MIL/uL (ref 4.22–5.81)
RDW: 13.2 % (ref 11.5–15.5)
WBC: 2.2 10*3/uL — AB (ref 4.0–10.5)
nRBC: 0 % (ref 0.0–0.2)

## 2018-06-12 MED ORDER — AZITHROMYCIN 600 MG PO TABS: 1200.0000 mg | ORAL_TABLET | ORAL | 0 refills | Status: DC

## 2018-06-12 NOTE — Progress Notes (Signed)
   Subjective: Jacob Gutierrez reports improvement in his scrotal pain and swelling today.  He denies any new acute complaints including shortness of breath, abdominal pain, chest pain.  He is currently homeless and is unsure of where he will be discharged to.  He adamantly states that he will not go to a homeless shelter.  He is in agreement with speaking to social work.  Objective:  Vital signs in last 24 hours: Vitals:   06/11/18 0552 06/11/18 0924 06/11/18 1758 06/11/18 2114  BP:  120/83 101/72 132/80  Pulse: 73 64 85 73  Resp: 18 18 18 18   Temp: 98.4 F (36.9 C) 97.9 F (36.6 C) 98.3 F (36.8 C) 98.7 F (37.1 C)  TempSrc: Oral Oral Oral Oral  SpO2:  100% 96% 100%  Weight:    88.2 kg  Height:       Physical Exam  Constitutional: He is oriented to person, place, and time.  Thin appearing male lying in bed in no acute distress.  HENT:  Head: Normocephalic and atraumatic.  Eyes: EOM are normal.  Cardiovascular: Normal rate and regular rhythm.  Pulmonary/Chest: Effort normal and breath sounds normal.  Abdominal: Soft. Bowel sounds are normal. He exhibits no distension. There is no tenderness.  Genitourinary:  Genitourinary Comments: Scrotal swelling present--improved from yesterday.  Small incision at the base of the scrotum with packing in place without significant drainage.  Musculoskeletal: He exhibits no edema or tenderness.  Neurological: He is alert and oriented to person, place, and time.  Skin: Skin is warm and dry.  Psychiatric: He has a normal mood and affect. His behavior is normal.  Nursing note and vitals reviewed.   Assessment/Plan:  Principal Problem:   Scrotal abscess Active Problems:   Human immunodeficiency virus (HIV) disease (HCC)   AIDS (acquired immune deficiency syndrome) [facility](HCC)  Jacob Gutierrez is a 32 year old male with poorly controlled HIV now on Biktarvy whopresented withscrotal/peritoneal abscess a/p I&D, on day 5  antibiotic treatment plan  currently on Linezolid.  Cefepime discontinued yesterday due to adequate coverage of MRSA/MSSA with Linezolid only.  He remains afebrile and scrotal swelling has improved definitely since admission. He is medically stable and the plan is for discharge today.  He however is homeless and does not have transportation. We will consult with social work today to address social factors prohibiting discharge.  He will be discharged with 10 additional days of Bactrim.  Scrotal/peritoneal abscess: 1.ContinueLinezolid600 mg q12h. Will switch to Bactrim upon discharge for 10 additional days. 2. Continue Daily dressing changes with wound irrigation  HIV: 1.Continue Biktarvy 2.Continueazithromycin 1200 mg QD and Bactrim 800-160 mg QD for opportunistic infection prophylaxis.  Dispo: Anticipated discharge today pending social work consult.  Synetta ShadowPrince,  M, MD 06/12/2018, 7:12 AM Pager: [phone]

## 2018-06-13 LAB — AEROBIC/ANAEROBIC CULTURE W GRAM STAIN (SURGICAL/DEEP WOUND)

## 2018-06-13 LAB — CULTURE, BLOOD (ROUTINE X 2)
Culture: NO GROWTH
Culture: NO GROWTH
Special Requests: ADEQUATE
Special Requests: ADEQUATE

## 2018-06-13 LAB — AEROBIC/ANAEROBIC CULTURE (SURGICAL/DEEP WOUND): SPECIAL REQUESTS: NORMAL

## 2018-06-14 ENCOUNTER — Telehealth: Payer: Self-pay | Admitting: Internal Medicine

## 2018-06-14 ENCOUNTER — Telehealth: Payer: Self-pay | Admitting: *Deleted

## 2018-06-14 NOTE — Telephone Encounter (Signed)
Post ED Visit - Positive Culture Follow-up  Culture report reviewed by antimicrobial stewardship pharmacist:  []  Enzo BiNathan Batchelder, Pharm.D. []  Celedonio MiyamotoJeremy Frens, Pharm.D., BCPS AQ-ID []  Garvin FilaMike Maccia, Pharm.D., BCPS []  Georgina PillionElizabeth Martin, Pharm.D., BCPS []  CeylonMinh Pham, VermontPharm.D., BCPS, AAHIVP []  Estella HuskMichelle Turner, Pharm.D., BCPS, AAHIVP [x]  Lysle Pearlachel Rumbarger, PharmD, BCPS []  Phillips Climeshuy Dang, PharmD, BCPS []  Agapito GamesAlison Masters, PharmD, BCPS []  Verlan FriendsErin Deja, PharmD  Positive wound culture Admitted then discharged on Bactrim and no further patient follow-up is required at this time.  Virl AxeRobertson, Fajr F[place] [facility]ley 06/14/2018, 11:28 AM

## 2018-06-14 NOTE — Telephone Encounter (Signed)
mrsa isolate is R to bactrim and R tetracycline. Will send in rx for linezolid 600mg  bid x 10d. And have him decrease bactrim to 1 tab daily

## 2018-06-25 ENCOUNTER — Inpatient Hospital Stay: Payer: Medicaid Other | Admitting: Infectious Disease

## 2018-07-15 ENCOUNTER — Other Ambulatory Visit: Payer: Self-pay

## 2018-07-15 ENCOUNTER — Emergency Department (HOSPITAL_COMMUNITY)
Admission: EM | Admit: 2018-07-15 | Discharge: 2018-07-15 | Disposition: A | Discharge status: Home / Self Care | Payer: Medicaid Other | Attending: Emergency Medicine | Admitting: Emergency Medicine

## 2018-07-15 ENCOUNTER — Encounter (HOSPITAL_COMMUNITY): Payer: Self-pay

## 2018-07-15 DIAGNOSIS — Z79899 Other long term (current) drug therapy: Secondary | ICD-10-CM | POA: Insufficient documentation

## 2018-07-15 DIAGNOSIS — F17210 Nicotine dependence, cigarettes, uncomplicated: Secondary | ICD-10-CM | POA: Insufficient documentation

## 2018-07-15 DIAGNOSIS — B20 Human immunodeficiency virus [HIV] disease: Secondary | ICD-10-CM | POA: Insufficient documentation

## 2018-07-15 DIAGNOSIS — L739 Follicular disorder, unspecified: Secondary | ICD-10-CM | POA: Diagnosis not present

## 2018-07-15 DIAGNOSIS — R21 Rash and other nonspecific skin eruption: Secondary | ICD-10-CM | POA: Diagnosis present

## 2018-07-15 LAB — CBC WITH DIFFERENTIAL/PLATELET
Abs Immature Granulocytes: 0.08 10*3/uL — ABNORMAL HIGH (ref 0.00–0.07)
Basophils Absolute: 0 10*3/uL (ref 0.0–0.1)
Basophils Relative: 0 %
EOS ABS: 0.3 10*3/uL (ref 0.0–0.5)
Eosinophils Relative: 8 %
HCT: 36.1 % — ABNORMAL LOW (ref 39.0–52.0)
Hemoglobin: 12 g/dL — ABNORMAL LOW (ref 13.0–17.0)
IMMATURE GRANULOCYTES: 2 %
Lymphocytes Relative: 16 %
Lymphs Abs: 0.7 10*3/uL (ref 0.7–4.0)
MCH: 31.4 pg (ref 26.0–34.0)
MCHC: 33.2 g/dL (ref 30.0–36.0)
MCV: 94.5 fL (ref 80.0–100.0)
MONOS PCT: 13 %
Monocytes Absolute: 0.6 10*3/uL (ref 0.1–1.0)
NEUTROS PCT: 61 %
NRBC: 0 % (ref 0.0–0.2)
Neutro Abs: 2.6 10*3/uL (ref 1.7–7.7)
PLATELETS: 253 10*3/uL (ref 150–400)
RBC: 3.82 MIL/uL — ABNORMAL LOW (ref 4.22–5.81)
RDW: 14.1 % (ref 11.5–15.5)
WBC: 4.3 10*3/uL (ref 4.0–10.5)

## 2018-07-15 LAB — BASIC METABOLIC PANEL
ANION GAP: 11 (ref 5–15)
BUN: 15 mg/dL (ref 6–20)
CALCIUM: 8.6 mg/dL — AB (ref 8.9–10.3)
CO2: 23 mmol/L (ref 22–32)
CREATININE: 0.6 mg/dL — AB (ref 0.61–1.24)
Chloride: 102 mmol/L (ref 98–111)
GFR calc Af Amer: >60 mL/min (ref >60)
GLUCOSE: 90 mg/dL (ref 70–99)
Potassium: 3.9 mmol/L (ref 3.5–5.1)
Sodium: 136 mmol/L (ref 135–145)

## 2018-07-15 MED ORDER — SULFAMETHOXAZOLE-TRIMETHOPRIM 800-160 MG PO TABS: 1.0000 | ORAL_TABLET | Freq: Two times a day (BID) | ORAL | 0 refills | Status: DC

## 2018-07-15 MED ORDER — CEPHALEXIN 500 MG PO CAPS: 500.0000 mg | ORAL_CAPSULE | Freq: Four times a day (QID) | ORAL | 0 refills | Status: DC

## 2018-07-15 NOTE — Discharge Instructions (Signed)
Stop the Bactrim you are taking at home once a day and take the prescription we give you twice a day. When you finish what we give you go back to your regular once a day Bactrim. Take the Cephalexin as directed. If you develop a rash or side effects stop the medication and return.  Call the Infectious Disease office tomorrow for follow up.

## 2018-07-15 NOTE — ED Triage Notes (Signed)
Pt states he has had bite marks on his legs unknown cause.

## 2018-07-15 NOTE — ED Provider Notes (Signed)
[facility] EMERGENCY DEPARTMENT Provider Note   CSN: [id number] Arrival date & time: 07/15/18  1437     History   Chief Complaint Chief Complaint  Patient presents with  . Rash    HPI Jacob Gutierrez is a 32 y.o.homeless male with hx of HIV and low CD4 T cell Abs 30 two months ago and CD4 Helper T cell = 3 who presents to the ED with rash to his legs and on on his scrotum. Patient reports that have been there for a while. Patient was admitted to the hospital 06/09/18 with a scrotal abscess that resolved.  Patient reports he has been taking his daily medications medications but they do not seem to help the areas on his legs. He is taking Bactrim DS once a day.   HPI  Past Medical History:  Diagnosis Date  . 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 H/O coccidioidomycosis    pulmonary   . HIV disease (HCC)   . Past history of allergy to penicillin-type antibiotic 07-03-2012   desensitization  . PNA (pneumonia)   . Shigella gastroenteritis   . Syphilis    history /treated     Patient Active Problem List   Diagnosis Date Noted  . Scrotal abscess 06/09/2018  . Fever 04/28/2018  . Headache 04/27/2018  . Condyloma 04/24/2018  . Weakness of both lower extremities   . Protein-calorie malnutrition, severe 12/12/2017  . Prostate abscess   . Personal history of MRSA (methicillin resistant Staphylococcus aureus) 12/09/2017  . History of ESBL E. coli infection 12/09/2017  . Dehydration   . Shigella infection   . Diarrhea 03/05/2017  . AIDS (acquired immune deficiency syndrome) (HCC)   . Proctitis   . Weight loss   . Homelessness   . 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 Syphilis contact, treated 01/02/2014  . Tobacco use disorder 01/02/2014  . Human immunodeficiency virus (HIV) disease (HCC) 01/01/2014    Past Surgical History:  Procedure Laterality Date  . TRANSURETHRAL RESECTION OF PROSTATE N/A 12/09/2017   Procedure:  TRANSURETHRAL RESECTION OF THE PROSTATE (TURP);  Surgeon: Crist Fat, MD;  Location: [facility];  Service: Urology;  Laterality: N/A;        Home Medications    Prior to Admission medications   Medication Sig Start Date End Date Taking? Authorizing Provider  acetaminophen (TYLENOL) 500 MG tablet Take 500-1,000 mg by mouth every 6 (six) hours as needed (for pain, fever, or headache).    [provider]  aspirin-acetaminophen-caffeine (EXCEDRIN MIGRAINE) [phone] MG tablet Take 2 tablets by mouth every 6 (six) hours as needed for headache.    [provider]  bictegravir-emtricitabine-tenofovir AF (BIKTARVY) 50-200-25 MG TABS tablet Take 1 tablet by mouth daily. 06/11/18   Levora Dredge, MD  cephALEXin (KEFLEX) 500 MG capsule Take 1 capsule (500 mg total) by mouth 4 (four) times daily. 07/15/18   Janne Napoleon, NP  famotidine (PEPCID AC) 10 MG chewable tablet Chew 10 mg by mouth 2 (two) times daily as needed for heartburn.    [provider]  sulfamethoxazole-trimethoprim (BACTRIM DS,SEPTRA DS) 800-160 MG tablet Take 1 tablet by mouth daily. 06/22/18 10/20/18  Levora Dredge, MD  sulfamethoxazole-trimethoprim (BACTRIM DS,SEPTRA DS) 800-160 MG tablet Take 1 tablet by mouth 2 (two) times daily for 10 days. 06/11/18 06/21/18  Levora Dredge, MD  sulfamethoxazole-trimethoprim (BACTRIM DS,SEPTRA DS) 800-160 MG tablet Take 1 tablet by mouth 2 (two) times daily for 7  days. 07/15/18 07/22/18  Janne NapoleonNeese, Zander Ingham M, NP    Family History Family History  Problem Relation Age of Onset  . Hypertension Mother   . Lupus Maternal Grandmother   . Cancer Paternal Grandfather     Social History Social History   Tobacco Use  . Smoking status: Current Every Day Smoker    Packs/day: 0.50    Types: Cigarettes  . Smokeless tobacco: Never Used  . Tobacco comment: cutting back  Substance Use Topics  . Alcohol use: Yes    Alcohol/week: 1.0 standard drinks    Types: 1 Standard  drinks or equivalent per week    Comment: whiskey occasionally   . Drug use: Yes    Frequency: 1.0 times per week    Types: Marijuana     Allergies   Amoxicillin and Vancomycin   Review of Systems Review of Systems  Constitutional: Negative for chills and fever.  HENT: Negative.   Respiratory: Negative for cough and shortness of breath.   Cardiovascular: Negative for chest pain.  Gastrointestinal: Negative for abdominal pain, nausea and vomiting.  Musculoskeletal: Negative for back pain.  Skin: Positive for wound.  Neurological: Negative for headaches.  Psychiatric/Behavioral: Negative for confusion.     Physical Exam Updated Vital Signs BP 112/68 (BP Location: Right Arm)   Pulse 80   Temp 98 F (36.7 C) (Oral)   Resp 12   SpO2 100%   Physical Exam Vitals signs and nursing note reviewed.  Constitutional:      General: He is not in acute distress.    Appearance: He is well-developed.  HENT:     Head: Normocephalic.     Nose: Nose normal.     Mouth/Throat:     Mouth: Mucous membranes are moist.  Neck:     Musculoskeletal: Neck supple.  Cardiovascular:     Rate and Rhythm: Normal rate.  Pulmonary:     Effort: Pulmonary effort is normal.  Musculoskeletal: Normal range of motion.  Skin:    Comments: Infected hair follicles to bilateral upper legs and one area to right scrotum. Small areas of erythema surrounding the lesions. No red streaking.   Neurological:     Mental Status: He is alert and oriented to person, place, and time.      ED Treatments / Results  Labs (all labs ordered are listed, but only abnormal results are displayed) Labs Reviewed  CBC WITH DIFFERENTIAL/PLATELET - Abnormal; Notable for the following components:      Result Value   RBC 3.82 (*)    Hemoglobin 12.0 (*)    HCT 36.1 (*)    Abs Immature Granulocytes 0.08 (*)    All other components within normal limits  BASIC METABOLIC PANEL - Abnormal; Notable for the following components:     Creatinine, Ser 0.60 (*)    Calcium 8.6 (*)    All other components within normal limits    Radiology No results found.  Procedures Procedures (including critical care time)  Medications Ordered in ED Medications - No data to display   Initial Impression / Assessment and Plan / ED Course  I have reviewed the triage vital signs and the nursing notes. 32 y.o. male with hx of HIV/AIDS here with skin infection stable for d/c without fever and no red streaking. Patient appears stable for d/c to f/u with his ID physician tomorrow. Discussed with the patient to take the medications we prescribe and stop the once a day Bactrim until he completes the Rx and  then go back to the once a day therapy. Patient voices understanding. Return precautions discussed.   Final Clinical Impressions(s) / ED Diagnoses   Final diagnoses:  Folliculitis    ED Discharge Orders         Ordered    cephALEXin (KEFLEX) 500 MG capsule  4 times daily     07/15/18 1700    sulfamethoxazole-trimethoprim (BACTRIM DS,SEPTRA DS) 800-160 MG tablet  2 times daily     07/15/18 1700           Kerrie Buffaloeese, Hero Mccathern UnionM, TexasNP 07/15/18 1709    Doug SouJacubowitz, Sam, MD 07/15/18 1758

## 2018-07-15 NOTE — ED Notes (Signed)
Patient verbalizes understanding of discharge instructions. Opportunity for questioning and answers were provided. Armband removed by staff, pt discharged from ED ambulatory by self\  

## 2018-07-18 ENCOUNTER — Other Ambulatory Visit: Payer: Self-pay

## 2018-07-18 ENCOUNTER — Inpatient Hospital Stay (HOSPITAL_COMMUNITY)
Admission: EM | Admit: 2018-07-18 | Discharge: 2018-07-24 | DRG: 975 | Disposition: A | Discharge status: Home / Self Care | Payer: Medicaid Other | Attending: Family Medicine | Admitting: Family Medicine

## 2018-07-18 ENCOUNTER — Encounter (HOSPITAL_COMMUNITY): Payer: Self-pay | Admitting: Emergency Medicine

## 2018-07-18 DIAGNOSIS — Z9119 Patient's noncompliance with other medical treatment and regimen: Secondary | ICD-10-CM

## 2018-07-18 DIAGNOSIS — Z5900 Homelessness unspecified: Secondary | ICD-10-CM

## 2018-07-18 DIAGNOSIS — Z888 Allergy status to other drugs, medicaments and biological substances status: Secondary | ICD-10-CM

## 2018-07-18 DIAGNOSIS — Z8614 Personal history of Methicillin resistant Staphylococcus aureus infection: Secondary | ICD-10-CM | POA: Diagnosis present

## 2018-07-18 DIAGNOSIS — Z1624 Resistance to multiple antibiotics: Secondary | ICD-10-CM | POA: Diagnosis present

## 2018-07-18 DIAGNOSIS — Z79899 Other long term (current) drug therapy: Secondary | ICD-10-CM

## 2018-07-18 DIAGNOSIS — Z9114 Patient's other noncompliance with medication regimen: Secondary | ICD-10-CM

## 2018-07-18 DIAGNOSIS — B9629 Other Escherichia coli [E. coli] as the cause of diseases classified elsewhere: Secondary | ICD-10-CM | POA: Diagnosis present

## 2018-07-18 DIAGNOSIS — Z1612 Extended spectrum beta lactamase (ESBL) resistance: Secondary | ICD-10-CM | POA: Diagnosis present

## 2018-07-18 DIAGNOSIS — L02415 Cutaneous abscess of right lower limb: Secondary | ICD-10-CM | POA: Diagnosis present

## 2018-07-18 DIAGNOSIS — L02416 Cutaneous abscess of left lower limb: Secondary | ICD-10-CM | POA: Diagnosis present

## 2018-07-18 DIAGNOSIS — N390 Urinary tract infection, site not specified: Secondary | ICD-10-CM | POA: Diagnosis present

## 2018-07-18 DIAGNOSIS — A419 Sepsis, unspecified organism: Secondary | ICD-10-CM

## 2018-07-18 DIAGNOSIS — L03115 Cellulitis of right lower limb: Secondary | ICD-10-CM | POA: Diagnosis present

## 2018-07-18 DIAGNOSIS — R319 Hematuria, unspecified: Secondary | ICD-10-CM | POA: Diagnosis present

## 2018-07-18 DIAGNOSIS — F17210 Nicotine dependence, cigarettes, uncomplicated: Secondary | ICD-10-CM | POA: Diagnosis present

## 2018-07-18 DIAGNOSIS — L739 Follicular disorder, unspecified: Secondary | ICD-10-CM | POA: Diagnosis present

## 2018-07-18 DIAGNOSIS — Z8619 Personal history of other infectious and parasitic diseases: Secondary | ICD-10-CM | POA: Diagnosis present

## 2018-07-18 DIAGNOSIS — L03116 Cellulitis of left lower limb: Secondary | ICD-10-CM | POA: Diagnosis present

## 2018-07-18 DIAGNOSIS — B20 Human immunodeficiency virus [HIV] disease: Principal | ICD-10-CM | POA: Diagnosis present

## 2018-07-18 DIAGNOSIS — D709 Neutropenia, unspecified: Secondary | ICD-10-CM | POA: Diagnosis present

## 2018-07-18 DIAGNOSIS — B9620 Unspecified Escherichia coli [E. coli] as the cause of diseases classified elsewhere: Secondary | ICD-10-CM | POA: Diagnosis present

## 2018-07-18 LAB — CBC WITH DIFFERENTIAL/PLATELET
Abs Immature Granulocytes: 0.14 10*3/uL — ABNORMAL HIGH (ref 0.00–0.07)
Basophils Absolute: 0 10*3/uL (ref 0.0–0.1)
Basophils Relative: 1 %
EOS ABS: 0.3 10*3/uL (ref 0.0–0.5)
Eosinophils Relative: 5 %
HCT: 39.9 % (ref 39.0–52.0)
Hemoglobin: 13.3 g/dL (ref 13.0–17.0)
Immature Granulocytes: 2 %
Lymphocytes Relative: 12 %
Lymphs Abs: 0.8 10*3/uL (ref 0.7–4.0)
MCH: 31.9 pg (ref 26.0–34.0)
MCHC: 33.3 g/dL (ref 30.0–36.0)
MCV: 95.7 fL (ref 80.0–100.0)
MONO ABS: 0.6 10*3/uL (ref 0.1–1.0)
MONOS PCT: 10 %
NEUTROS ABS: 4.6 10*3/uL (ref 1.7–7.7)
NEUTROS PCT: 70 %
PLATELETS: 235 10*3/uL (ref 150–400)
RBC: 4.17 MIL/uL — AB (ref 4.22–5.81)
RDW: 13.9 % (ref 11.5–15.5)
WBC: 6.4 10*3/uL (ref 4.0–10.5)
nRBC: 0 % (ref 0.0–0.2)

## 2018-07-18 LAB — BASIC METABOLIC PANEL
ANION GAP: 11 (ref 5–15)
BUN: 19 mg/dL (ref 6–20)
CALCIUM: 8.7 mg/dL — AB (ref 8.9–10.3)
CO2: 23 mmol/L (ref 22–32)
Chloride: 104 mmol/L (ref 98–111)
Creatinine, Ser: 0.79 mg/dL (ref 0.61–1.24)
GFR calc Af Amer: >60 mL/min (ref >60)
GLUCOSE: 94 mg/dL (ref 70–99)
POTASSIUM: 4.3 mmol/L (ref 3.5–5.1)
SODIUM: 138 mmol/L (ref 135–145)

## 2018-07-18 MED ORDER — LINEZOLID 600 MG/300ML IV SOLN
600.0000 mg | Freq: Once | INTRAVENOUS | Status: AC
  Administered 2018-07-19: 600 mg via INTRAVENOUS
  Filled 2018-07-18: qty 300

## 2018-07-18 MED ORDER — SODIUM CHLORIDE 0.9 % IV BOLUS
1000.0000 mL | Freq: Once | INTRAVENOUS | Status: AC
  Administered 2018-07-19: 1000 mL via INTRAVENOUS

## 2018-07-18 NOTE — ED Triage Notes (Addendum)
Patient c/o bilateral leg pain and working and standing on legs all day. Was at the hospital a couple of days ago and dx with folliculitis. Was unable to get abx previously, just picked them up today and has not taken the abx yet - patient has no car and homeless.

## 2018-07-18 NOTE — ED Provider Notes (Signed)
[facility] EMERGENCY DEPARTMENT Provider Note   CSN: [id number] Arrival date & time: 07/18/18  1940     History   Chief Complaint Chief Complaint  Patient presents with  . Leg Pain    HPI Jacob Gutierrez is a 32 y.o. male with a past medical history of AIDS, not currently taking antiretroviral medicines, recurrent MRSA infections, who presents today for evaluation of leg pain.  He was seen on 12/23 and diagnosed with folliculitis however he has not started his antibiotics yet.  Reports that over the past 6 to 8 hours he has had significant worsening of redness, pain, and swelling on his bilateral lower legs with drainage from small wounds.  He denies any fevers nausea or vomiting.  HPI  Past Medical History:  Diagnosis Date  . 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 H/O coccidioidomycosis    pulmonary   . HIV disease (HCC)   . Past history of allergy to penicillin-type antibiotic 07-03-2012   desensitization  . PNA (pneumonia)   . Shigella gastroenteritis   . Syphilis    history /treated     Patient Active Problem List   Diagnosis Date Noted  . Scrotal abscess 06/09/2018  . Fever 04/28/2018  . Headache 04/27/2018  . Condyloma 04/24/2018  . Weakness of both lower extremities   . Protein-calorie malnutrition, severe 12/12/2017  . Prostate abscess   . Personal history of MRSA (methicillin resistant Staphylococcus aureus) 12/09/2017  . History of ESBL E. coli infection 12/09/2017  . Dehydration   . Shigella infection   . Diarrhea 03/05/2017  . AIDS (acquired immune deficiency syndrome) (HCC)   . Proctitis   . Weight loss   . Homelessness   . 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 Syphilis contact, treated 01/02/2014  . Tobacco use disorder 01/02/2014  . Human immunodeficiency virus (HIV) disease (HCC) 01/01/2014    Past Surgical History:  Procedure Laterality Date  . TRANSURETHRAL RESECTION OF PROSTATE N/A  12/09/2017   Procedure: TRANSURETHRAL RESECTION OF THE PROSTATE (TURP);  Surgeon: Crist FatHerrick, Benjamin W, MD;  Location: [facility];  Service: Urology;  Laterality: N/A;        Home Medications    Prior to Admission medications   Medication Sig Start Date End Date Taking? Authorizing Provider  bictegravir-emtricitabine-tenofovir AF (BIKTARVY) 50-200-25 MG TABS tablet Take 1 tablet by mouth daily. 06/11/18   Levora DredgeHelberg, Justin, MD  cephALEXin (KEFLEX) 500 MG capsule Take 1 capsule (500 mg total) by mouth 4 (four) times daily. 07/15/18   Janne NapoleonNeese, Hope M, NP  sulfamethoxazole-trimethoprim (BACTRIM DS,SEPTRA DS) 800-160 MG tablet Take 1 tablet by mouth daily. 06/22/18 10/20/18  Levora DredgeHelberg, Justin, MD  sulfamethoxazole-trimethoprim (BACTRIM DS,SEPTRA DS) 800-160 MG tablet Take 1 tablet by mouth 2 (two) times daily for 10 days. Patient not taking: Reported on 07/18/2018 06/11/18 07/18/26  Levora DredgeHelberg, Justin, MD  sulfamethoxazole-trimethoprim (BACTRIM DS,SEPTRA DS) 800-160 MG tablet Take 1 tablet by mouth 2 (two) times daily for 7 days. 07/15/18 07/22/18  Janne NapoleonNeese, Hope M, NP    Family History Family History  Problem Relation Age of Onset  . Hypertension Mother   . Lupus Maternal Grandmother   . Cancer Paternal Grandfather     Social History Social History   Tobacco Use  . Smoking status: Current Every Day Smoker    Packs/day: 0.50    Types: Cigarettes  . Smokeless tobacco: Never Used  . Tobacco comment: cutting back  Substance Use Topics  .  Alcohol use: Yes    Alcohol/week: 1.0 standard drinks    Types: 1 Standard drinks or equivalent per week    Comment: whiskey occasionally   . Drug use: Yes    Frequency: 1.0 times per week    Types: Marijuana     Allergies   Amoxicillin and Vancomycin   Review of Systems Review of Systems  Constitutional: Negative for chills and fever.  HENT: Negative for congestion.   Respiratory: Negative for chest tightness and shortness of breath.   Cardiovascular:  Negative for chest pain.  Gastrointestinal: Negative for abdominal pain, diarrhea, nausea and vomiting.  Skin: Positive for color change and wound.  All other systems reviewed and are negative.    Physical Exam Updated Vital Signs BP 125/76   Pulse 98   Temp (!) 101.6 F (38.7 C) (Rectal)   Resp 16   Ht 6\' 1"  (1.854 m)   Wt 86.2 kg   SpO2 98%   BMI 25.07 kg/m   Physical Exam Vitals signs and nursing note reviewed. Exam conducted with a chaperone present.  Constitutional:      Appearance: He is well-developed. He is not toxic-appearing.  HENT:     Head: Normocephalic and atraumatic.     Nose: Nose normal.     Mouth/Throat:     Mouth: Mucous membranes are moist.  Eyes:     General: No scleral icterus.       Right eye: No discharge.        Left eye: No discharge.     Conjunctiva/sclera: Conjunctivae normal.  Neck:     Musculoskeletal: Normal range of motion and neck supple. No neck rigidity or muscular tenderness.  Cardiovascular:     Rate and Rhythm: Normal rate and regular rhythm.     Pulses: Normal pulses.     Heart sounds: Normal heart sounds.  Pulmonary:     Effort: Pulmonary effort is normal. No respiratory distress.     Breath sounds: No stridor.  Abdominal:     General: There is no distension.     Tenderness: There is no abdominal tenderness. There is no guarding.  Musculoskeletal: Normal range of motion.        General: No deformity.     Comments: Diffuse TTP over bilateral lower legs in area of infection.  No palpable crepitus or subcutaneous emphysema.  Compartments are soft and easily compressible.  Patient does not have pain with ankle movement.  Skin:    General: Skin is warm and dry.     Comments: Please see clinical images.  There are multiple pustules on bilateral lower extremities.  On the left lower extremity laterally there is a opened wound draining purulent and bloody material with surrounding erythema, induration warmth and tenderness. On the  right lower extremity there is a wound on the lateral lower leg draining purulent material. scant purulent material was easily expressed from both already draining wounds without need for incision.  Neurological:     General: No focal deficit present.     Mental Status: He is alert.     Motor: No abnormal muscle tone.  Psychiatric:        Mood and Affect: Mood normal.        Behavior: Behavior normal.        ED Treatments / Results  Labs (all labs ordered are listed, but only abnormal results are displayed) Labs Reviewed  BASIC METABOLIC PANEL - Abnormal; Notable for the following components:  Result Value   Calcium 8.7 (*)    All other components within normal limits  CBC WITH DIFFERENTIAL/PLATELET - Abnormal; Notable for the following components:   RBC 4.17 (*)    Abs Immature Granulocytes 0.14 (*)    All other components within normal limits  URINALYSIS, ROUTINE W REFLEX MICROSCOPIC - Abnormal; Notable for the following components:   Color, Urine AMBER (*)    APPearance HAZY (*)    Specific Gravity, Urine 1.034 (*)    Hgb urine dipstick SMALL (*)    Protein, ur 100 (*)    Leukocytes, UA LARGE (*)    WBC, UA >50 (*)    Bacteria, UA FEW (*)    All other components within normal limits  AEROBIC CULTURE (SUPERFICIAL SPECIMEN)  AEROBIC CULTURE (SUPERFICIAL SPECIMEN)  CULTURE, BLOOD (ROUTINE X 2)  CULTURE, BLOOD (ROUTINE X 2)  HEPATIC FUNCTION PANEL  I-STAT CG4 LACTIC ACID, ED    EKG None  Radiology No results found.  Procedures Procedures (including critical care time)  Medications Ordered in ED Medications  linezolid (ZYVOX) IVPB 600 mg (has no administration in time range)  sodium chloride 0.9 % bolus 1,000 mL (has no administration in time range)  meropenem (MERREM) 1 g in sodium chloride 0.9 % 100 mL IVPB (has no administration in time range)     Initial Impression / Assessment and Plan / ED Course  I have reviewed the triage vital signs and the  nursing notes.  Pertinent labs & imaging results that were available during my care of the patient were reviewed by me and considered in my medical decision making (see chart for details).  Clinical Course as of Jul 19 14  Wed Jul 18, 2018  2344 HR over 100.  Code sepsis called.   Temp(!): 101.6 F (38.7 C) [EH]  2357 Spoke with Dr. Julian ReilGardner who agreed to admit patient.   [EH]    Clinical Course User Index [EH] Cristina GongHammond, Chasady Longwell W, PA-C   Patient presents today for evaluation of worsening skin infection.  He was seen and diagnosed with folliculitis 2 days ago, however he did not start taking his antibiotics.  On arrival he was not febrile or tachycardic, however on repeat vitals he was found to have a rectal temp of 101.6 with a heart rate of 101 and borderline tachypnea.  Code sepsis was called.  Chart review shows that he has resistance to multiple antibiotics and allergies to vancomycin.  He has previously been on linezolid for resistant MRSA, which was restarted after consultation with pharmacy for appropriate initial antibiotic choice.  Patient has a history of noncompliance with his HIV medications, and his last CD4 count 2 months ago showed an absolute of 30 with 3% T helper cells.    Patient's blood pressure was normal.  He was given 1 L of fluids.  Was not given full 30/kg due to blood pressure.    This patient was discussed with Dr. Pilar PlateBero.  I spoke with Dr. Julian ReilGardner who agreed to admit patient.   Final Clinical Impressions(s) / ED Diagnoses   Final diagnoses:  Sepsis without acute organ dysfunction, due to unspecified organism [facility](HCC)  AIDS (acquired immune deficiency syndrome) (HCC)  Non compliance w medication regimen    ED Discharge Orders    None       Cristina GongHammond, Emmersen Garraway W, PA-C 07/19/18 0016    Sabas SousBero, Michael M, MD 07/21/18 [phone]

## 2018-07-18 NOTE — ED Triage Notes (Signed)
Pt was here two days ago and was dx with folliculitis. Pt stated he has not been able to get antibiotic until today. Pt said legs are more swollen than before. No fevERS, N/V

## 2018-07-19 DIAGNOSIS — Z9119 Patient's noncompliance with other medical treatment and regimen: Secondary | ICD-10-CM | POA: Diagnosis not present

## 2018-07-19 DIAGNOSIS — Z8619 Personal history of other infectious and parasitic diseases: Secondary | ICD-10-CM

## 2018-07-19 DIAGNOSIS — L02415 Cutaneous abscess of right lower limb: Secondary | ICD-10-CM

## 2018-07-19 DIAGNOSIS — Z9114 Patient's other noncompliance with medication regimen: Secondary | ICD-10-CM | POA: Diagnosis not present

## 2018-07-19 DIAGNOSIS — N390 Urinary tract infection, site not specified: Secondary | ICD-10-CM

## 2018-07-19 DIAGNOSIS — A419 Sepsis, unspecified organism: Secondary | ICD-10-CM

## 2018-07-19 DIAGNOSIS — D709 Neutropenia, unspecified: Secondary | ICD-10-CM | POA: Diagnosis present

## 2018-07-19 DIAGNOSIS — Z79899 Other long term (current) drug therapy: Secondary | ICD-10-CM | POA: Diagnosis not present

## 2018-07-19 DIAGNOSIS — B9629 Other Escherichia coli [E. coli] as the cause of diseases classified elsewhere: Secondary | ICD-10-CM | POA: Diagnosis present

## 2018-07-19 DIAGNOSIS — L03115 Cellulitis of right lower limb: Secondary | ICD-10-CM | POA: Diagnosis present

## 2018-07-19 DIAGNOSIS — B9620 Unspecified Escherichia coli [E. coli] as the cause of diseases classified elsewhere: Secondary | ICD-10-CM | POA: Diagnosis present

## 2018-07-19 DIAGNOSIS — L739 Follicular disorder, unspecified: Secondary | ICD-10-CM | POA: Diagnosis present

## 2018-07-19 DIAGNOSIS — L02416 Cutaneous abscess of left lower limb: Secondary | ICD-10-CM

## 2018-07-19 DIAGNOSIS — Z1624 Resistance to multiple antibiotics: Secondary | ICD-10-CM | POA: Diagnosis present

## 2018-07-19 DIAGNOSIS — Z590 Homelessness: Secondary | ICD-10-CM | POA: Diagnosis not present

## 2018-07-19 DIAGNOSIS — L03116 Cellulitis of left lower limb: Secondary | ICD-10-CM | POA: Diagnosis present

## 2018-07-19 DIAGNOSIS — Z888 Allergy status to other drugs, medicaments and biological substances status: Secondary | ICD-10-CM | POA: Diagnosis not present

## 2018-07-19 DIAGNOSIS — R319 Hematuria, unspecified: Secondary | ICD-10-CM | POA: Diagnosis present

## 2018-07-19 DIAGNOSIS — Z8614 Personal history of Methicillin resistant Staphylococcus aureus infection: Secondary | ICD-10-CM | POA: Diagnosis not present

## 2018-07-19 DIAGNOSIS — B20 Human immunodeficiency virus [HIV] disease: Secondary | ICD-10-CM | POA: Diagnosis not present

## 2018-07-19 DIAGNOSIS — Z1612 Extended spectrum beta lactamase (ESBL) resistance: Secondary | ICD-10-CM | POA: Diagnosis present

## 2018-07-19 DIAGNOSIS — F17210 Nicotine dependence, cigarettes, uncomplicated: Secondary | ICD-10-CM | POA: Diagnosis present

## 2018-07-19 LAB — CBC
HCT: 33.3 % — ABNORMAL LOW (ref 39.0–52.0)
Hemoglobin: 11.4 g/dL — ABNORMAL LOW (ref 13.0–17.0)
MCH: 31.7 pg (ref 26.0–34.0)
MCHC: 34.2 g/dL (ref 30.0–36.0)
MCV: 92.5 fL (ref 80.0–100.0)
Platelets: 224 10*3/uL (ref 150–400)
RBC: 3.6 MIL/uL — ABNORMAL LOW (ref 4.22–5.81)
RDW: 13.8 % (ref 11.5–15.5)
WBC: 5.6 10*3/uL (ref 4.0–10.5)
nRBC: 0 % (ref 0.0–0.2)

## 2018-07-19 LAB — MRSA PCR SCREENING: MRSA by PCR: POSITIVE — AB

## 2018-07-19 LAB — HEPATIC FUNCTION PANEL
ALT: 15 U/L (ref 0–44)
AST: 28 U/L (ref 15–41)
Albumin: 3.3 g/dL — ABNORMAL LOW (ref 3.5–5.0)
Alkaline Phosphatase: 66 U/L (ref 38–126)
Bilirubin, Direct: 0.2 mg/dL (ref 0.0–0.2)
Indirect Bilirubin: 0.5 mg/dL (ref 0.3–0.9)
Total Bilirubin: 0.7 mg/dL (ref 0.3–1.2)
Total Protein: 8.5 g/dL — ABNORMAL HIGH (ref 6.5–8.1)

## 2018-07-19 LAB — BASIC METABOLIC PANEL
Anion gap: 9 (ref 5–15)
BUN: 16 mg/dL (ref 6–20)
CALCIUM: 8 mg/dL — AB (ref 8.9–10.3)
CO2: 22 mmol/L (ref 22–32)
Chloride: 104 mmol/L (ref 98–111)
Creatinine, Ser: 0.65 mg/dL (ref 0.61–1.24)
GFR calc Af Amer: >60 mL/min (ref >60)
GFR calc non Af Amer: >60 mL/min (ref >60)
Glucose, Bld: 106 mg/dL — ABNORMAL HIGH (ref 70–99)
Potassium: 3.7 mmol/L (ref 3.5–5.1)
Sodium: 135 mmol/L (ref 135–145)

## 2018-07-19 LAB — URINALYSIS, ROUTINE W REFLEX MICROSCOPIC
BILIRUBIN URINE: NEGATIVE
Glucose, UA: NEGATIVE mg/dL
Ketones, ur: NEGATIVE mg/dL
Nitrite: NEGATIVE
PH: 5 (ref 5.0–8.0)
Protein, ur: 100 mg/dL — AB
SPECIFIC GRAVITY, URINE: 1.034 — AB (ref 1.005–1.030)
WBC, UA: >50 WBC/hpf — ABNORMAL HIGH (ref 0–5)

## 2018-07-19 LAB — I-STAT CG4 LACTIC ACID, ED: LACTIC ACID, VENOUS: 1.66 mmol/L (ref 0.5–1.9)

## 2018-07-19 MED ORDER — ONDANSETRON HCL 4 MG/2ML IJ SOLN: 4.0000 mg | Freq: Four times a day (QID) | INTRAMUSCULAR | Status: DC | PRN

## 2018-07-19 MED ORDER — SODIUM CHLORIDE 0.9 % IV SOLN
INTRAVENOUS | Status: DC
  Administered 2018-07-19: 02:00:00 via INTRAVENOUS

## 2018-07-19 MED ORDER — ENOXAPARIN SODIUM 40 MG/0.4ML ~~LOC~~ SOLN
40.0000 mg | Freq: Every day | SUBCUTANEOUS | Status: DC
  Administered 2018-07-19 – 2018-07-24 (×6): 40 mg via SUBCUTANEOUS
  Filled 2018-07-19 (×6): qty 0.4

## 2018-07-19 MED ORDER — LINEZOLID 600 MG/300ML IV SOLN
600.0000 mg | Freq: Two times a day (BID) | INTRAVENOUS | Status: DC
  Administered 2018-07-19 – 2018-07-23 (×9): 600 mg via INTRAVENOUS
  Filled 2018-07-19 (×9): qty 300

## 2018-07-19 MED ORDER — SODIUM CHLORIDE 0.9 % IV SOLN
1.0000 g | Freq: Three times a day (TID) | INTRAVENOUS | Status: DC
  Administered 2018-07-19 (×3): 1 g via INTRAVENOUS
  Filled 2018-07-19 (×3): qty 1

## 2018-07-19 MED ORDER — LINEZOLID 600 MG PO TABS: 600.0000 mg | ORAL_TABLET | Freq: Two times a day (BID) | ORAL | 0 refills | Status: DC

## 2018-07-19 MED ORDER — SULFAMETHOXAZOLE-TRIMETHOPRIM 400-80 MG PO TABS
1.0000 | ORAL_TABLET | Freq: Every day | ORAL | Status: DC
  Administered 2018-07-19 – 2018-07-24 (×6): 1 via ORAL
  Filled 2018-07-19 (×6): qty 1

## 2018-07-19 MED ORDER — ONDANSETRON HCL 4 MG PO TABS: 4.0000 mg | ORAL_TABLET | Freq: Four times a day (QID) | ORAL | Status: DC | PRN

## 2018-07-19 MED ORDER — SODIUM CHLORIDE 0.9 % IV SOLN: 2.0000 g | Freq: Two times a day (BID) | INTRAVENOUS | Status: DC

## 2018-07-19 MED ORDER — ACETAMINOPHEN 650 MG RE SUPP: 650.0000 mg | Freq: Four times a day (QID) | RECTAL | Status: DC | PRN

## 2018-07-19 MED ORDER — OXYCODONE-ACETAMINOPHEN 5-325 MG PO TABS: 1.0000 | ORAL_TABLET | Freq: Four times a day (QID) | ORAL | Status: DC | PRN

## 2018-07-19 MED ORDER — ALBUTEROL SULFATE (2.5 MG/3ML) 0.083% IN NEBU: 2.5000 mg | INHALATION_SOLUTION | RESPIRATORY_TRACT | Status: DC | PRN

## 2018-07-19 MED ORDER — ACETAMINOPHEN 325 MG PO TABS
650.0000 mg | ORAL_TABLET | Freq: Four times a day (QID) | ORAL | Status: DC | PRN
  Administered 2018-07-19 – 2018-07-23 (×4): 650 mg via ORAL
  Filled 2018-07-19 (×4): qty 2

## 2018-07-19 MED ORDER — ACETAMINOPHEN 500 MG PO TABS: 1000.0000 mg | ORAL_TABLET | Freq: Once | ORAL | Status: DC

## 2018-07-19 MED FILL — LINEZOLID 600 MG TABS: 600 | 10 days supply | Qty: 20 | Fill #0

## 2018-07-19 NOTE — Progress Notes (Addendum)
Patient admitted to hospital for IV antibiotics. History of MRSA (R-bactrim, doxy, cipro; S-clindamycin) from lower extremity wound in November; never received his linezolid prescription during that time now with relapse of infection requiring admission to the hospital and IV antibiotics. Wound swab culture of wound pending but showing GPCs on stain. He will need medications brought to him at discharge   I have made him an appointment with [facility] to see me next Tuesday 12/31 @ 9:30am. Will reach out to Mitch to help coordinate ride for him.   Will discuss with pharmacy to assist with getting him linezolid prior to discharge home. Could also consider clindamycin vs oritavancin injection.   Will check HIV RNA/CD4 while admitted as he is due for repeat blood work.   Rexene AlbertsStephanie Dixon, MSN, NP-C [facility]  [email or web address] Pager: [phone] Office: [phone]  [facility] Main #: [phone]

## 2018-07-19 NOTE — Progress Notes (Addendum)
PROGRESS NOTE        PATIENT DETAILS Name: Jacob Gutierrez Age: 32 y.o. Sex: male Date of Birth: 05-13-1986 Admit Date: 07/18/2018 Admitting Physician Hillary BowJared M Gardner, DO EAV:[id number]:Edwards, Kinnie ScalesMichelle P, NP  Brief Narrative: Patient is a 32 y.o. male with history of HIV (last CD4 count 30 on 04/24/2018)-noncompliant to antiretrovirals, recent history of scrotal abscess with MRSA-presenting with fever, bilateral lower extremity cellulitis.  Started on empiric antimicrobial therapy and admitted to the hospitalist service.  See below for further details  Subjective: Lying comfortably in bed-denies any chest pain or shortness of breath.  Continues to have some mild pain in the lower extremities at the site of the furuncle.  Assessment/Plan: Sepsis likely secondary to bilateral lower extremity soft tissue infection: Sepsis pathophysiology has resolved.  Has small abscesses in bilateral lower extremity that are draining without any ongoing fluctuation present-erythema/surrounding cellulitis seems to have improved and is well within the demarcated area-see pictures below.  Wound culture on 12/25 (not sure if this was a swab) positive for gram-positive cocci blood cultures pending-restart continue Zyvox (approved by ID MD Dr. Ammie Ferrieromer)-and discontinue meropenem.  Follow closely.  ?  Asymptomatic bacteriuria: UA suggestive of UTI-but patient denies any symptoms.  No urine culture sent-we will stop meropenem and watch closely.  HIV: Noncompliant with antiretrovirals-Dr. Luciana Axeomer will arrange for outpatient follow-up.  Will start prophylactic Bactrim  Recent history of scrotal infection: Scrotum area appears unremarkable-cultures were positive for MRSA-he apparently finished a course of Zyvox.  DVT Prophylaxis: Prophylactic Lovenox   Code Status: Full code or DNR  Family Communication: None at bedside  Disposition Plan: Remain inpatient  Antimicrobial agents: Anti-infectives  (From admission, onward)   Start     Dose/Rate Route Frequency Ordered Stop   07/19/18 1000  linezolid (ZYVOX) IVPB 600 mg     600 mg 300 mL/hr over 60 Minutes Intravenous Every 12 hours 07/19/18 0900     07/19/18 0030  ceFEPIme (MAXIPIME) 2 g in sodium chloride 0.9 % 100 mL IVPB  Status:  Discontinued     2 g 200 mL/hr over 30 Minutes Intravenous Every 12 hours 07/19/18 0002 07/19/18 0004   07/19/18 0015  meropenem (MERREM) 1 g in sodium chloride 0.9 % 100 mL IVPB     1 g 200 mL/hr over 30 Minutes Intravenous Every 8 hours 07/19/18 0010     07/18/18 2359  linezolid (ZYVOX) IVPB 600 mg     600 mg 300 mL/hr over 60 Minutes Intravenous  Once 07/18/18 2337 07/19/18 0346      Procedures: None  CONSULTS:  None  Time spent: 25- minutes-Greater than 50% of this time was spent in counseling, explanation of diagnosis, planning of further management, and coordination of care.  MEDICATIONS: Scheduled Meds: . acetaminophen  1,000 mg Oral Once  . enoxaparin (LOVENOX) injection  40 mg Subcutaneous Daily   Continuous Infusions: . sodium chloride 125 mL/hr at 07/19/18 0228  . linezolid (ZYVOX) IV 600 mg (07/19/18 1053)  . meropenem (MERREM) IV 1 g (07/19/18 0540)   PRN Meds:.acetaminophen **OR** acetaminophen, ondansetron **OR** ondansetron (ZOFRAN) IV   PHYSICAL EXAM: Vital signs: Vitals:   07/19/18 0015 07/19/18 0100 07/19/18 0218 07/19/18 0524  BP: (!) 126/111 119/78 109/68 118/73  Pulse: (!) 108 93 95 91  Resp: (!) 24 20 16 16   Temp:   99.4 F (37.4  C) 98.8 F (37.1 C)  TempSrc:   Oral Oral  SpO2: 98% 97% 98% 100%  Weight:      Height:       Filed Weights   07/18/18 2005 07/18/18 2011  Weight: 86.2 kg 86.2 kg   Body mass index is 25.07 kg/m.   General appearance :Awake, alert, not in any distress.  Eyes:.Pink conjunctiva HEENT: Atraumatic and Normocephalic Neck: supple Resp:Good air entry bilaterally, no added sounds  CVS: S1 S2 regular, no murmurs.  GI: Bowel  sounds present, Non tender and not distended with no gaurding, rigidity or rebound.No organomegaly Extremities: B/L Lower Ext shows no edema, both legs are warm to touch-see pic below Neurology:  speech clear,Non focal, sensation is grossly intact. Psychiatric: Normal judgment and insight. Alert and oriented x 3. Normal mood. Musculoskeletal:No digital cyanosis Skin:No Rash, warm and dry Wounds:N/A  Left Leg: Media Information   Document Information   Photos    07/19/2018 08:47  Attached To:  Hospital Encounter on 07/18/18  Source Information   Keeanna Villafranca, Werner LeanShanker M, MD  [facility]   Right Leg: Media Information   Document Information   Photos    07/19/2018 08:48  Attached To:  Hospital Encounter on 07/18/18  Source Information   Josedejesus Marcum, Werner LeanShanker M, MD  [facility]     I have personally reviewed following labs and imaging studies  LABORATORY DATA: CBC: Recent Labs  Lab 07/15/18 1548 07/18/18 2237 07/19/18 0222  WBC 4.3 6.4 5.6  NEUTROABS 2.6 4.6  --   HGB 12.0* 13.3 11.4*  HCT 36.1* 39.9 33.3*  MCV 94.5 95.7 92.5  PLT 253 235 224    Basic Metabolic Panel: Recent Labs  Lab 07/15/18 1548 07/18/18 2237 07/19/18 0222  NA 136 138 135  K 3.9 4.3 3.7  CL 102 104 104  CO2 23 23 22   GLUCOSE 90 94 106*  BUN 15 19 16   CREATININE 0.60* 0.79 0.65  CALCIUM 8.6* 8.7* 8.0*    GFR: Estimated Creatinine Clearance: 147 mL/min (by C-G formula based on SCr of 0.65 mg/dL).  Liver Function Tests: Recent Labs  Lab 07/18/18 2237  AST 28  ALT 15  ALKPHOS 66  BILITOT 0.7  PROT 8.5*  ALBUMIN 3.3*   No results for input(s): LIPASE, AMYLASE in the last 168 hours. No results for input(s): AMMONIA in the last 168 hours.  Coagulation Profile: No results for input(s): INR, PROTIME in the last 168 hours.  Cardiac Enzymes: No results for input(s): CKTOTAL, CKMB, CKMBINDEX, TROPONINI in the last 168 hours.  BNP (last 3 results) No results for  input(s): PROBNP in the last 8760 hours.  HbA1C: No results for input(s): HGBA1C in the last 72 hours.  CBG: No results for input(s): GLUCAP in the last 168 hours.  Lipid Profile: No results for input(s): CHOL, HDL, LDLCALC, TRIG, CHOLHDL, LDLDIRECT in the last 72 hours.  Thyroid Function Tests: No results for input(s): TSH, T4TOTAL, FREET4, T3FREE, THYROIDAB in the last 72 hours.  Anemia Panel: No results for input(s): VITAMINB12, FOLATE, FERRITIN, TIBC, IRON, RETICCTPCT in the last 72 hours.  Urine analysis:    Component Value Date/Time   COLORURINE AMBER (A) 07/18/2018 2338   APPEARANCEUR HAZY (A) 07/18/2018 2338   APPEARANCEUR Hazy 07/27/2013 1742   LABSPEC 1.034 (H) 07/18/2018 2338   LABSPEC 1.026 07/27/2013 1742   PHURINE 5.0 07/18/2018 2338   GLUCOSEU NEGATIVE 07/18/2018 2338   GLUCOSEU Negative 07/27/2013 1742   HGBUR SMALL (A) 07/18/2018 2338  BILIRUBINUR NEGATIVE 07/18/2018 2338   BILIRUBINUR Negative 07/27/2013 1742   KETONESUR NEGATIVE 07/18/2018 2338   PROTEINUR 100 (A) 07/18/2018 2338   UROBILINOGEN 1 12/17/2013 1118   NITRITE NEGATIVE 07/18/2018 2338   LEUKOCYTESUR LARGE (A) 07/18/2018 2338   LEUKOCYTESUR Negative 07/27/2013 1742    Sepsis Labs: Lactic Acid, Venous    Component Value Date/Time   LATICACIDVEN 1.66 07/18/2018 2358    MICROBIOLOGY: Recent Results (from the past 240 hour(s))  Aerobic Culture (superficial specimen)     Status: None (Preliminary result)   Collection Time: 07/18/18 10:09 PM  Result Value Ref Range Status   Specimen Description WOUND LEFT LEG  Final   Special Requests Immunocompromised  Final   Gram Stain   Final    MODERATE WBC PRESENT, PREDOMINANTLY PMN ABUNDANT GRAM POSITIVE COCCI Performed at [facility], [address], [place], [place] [address]    Culture PENDING  Incomplete   Report Status PENDING  Incomplete  Aerobic Culture (superficial specimen)     Status: None (Preliminary result)   Collection  Time: 07/18/18 10:09 PM  Result Value Ref Range Status   Specimen Description WOUND RIGHT LEG  Final   Special Requests Immunocompromised  Final   Gram Stain   Final    MODERATE WBC PRESENT, PREDOMINANTLY PMN MODERATE GRAM POSITIVE COCCI FEW GRAM POSITIVE RODS Performed at [facility], [address] [place], [place] [address]    Culture PENDING  Incomplete   Report Status PENDING  Incomplete  MRSA PCR Screening     Status: Abnormal   Collection Time: 07/19/18  2:23 AM  Result Value Ref Range Status   MRSA by PCR POSITIVE (A) NEGATIVE Final    Comment:        The GeneXpert MRSA Assay (FDA approved for NASAL specimens only), is one component of a comprehensive MRSA colonization surveillance program. It is not intended to diagnose MRSA infection nor to guide or monitor treatment for MRSA infections. RESULT CALLED TO, READ BACK BY AND VERIFIED WITH: RN C MASON [id number] MLM     RADIOLOGY STUDIES/RESULTS: No results found.   LOS: 0 days   Jeoffrey Massed, MD  [facility]  If 7PM-7AM, please contact night-coverage  Please page via [email or web address]-Password [email or web address]-click on MD name and type text message  07/19/2018, 11:14 AM

## 2018-07-19 NOTE — Plan of Care (Signed)
  Problem: Safety: Goal: Ability to remain free from injury will improve Outcome: Progressing   

## 2018-07-19 NOTE — Consult Note (Addendum)
WOC Nurse wound consult note Reason for Consult: Consult requested for bilat legs Wound type: Pt states he had previous red raised lesions to anterior legs which ruptured and evolved into full thickness wounds. These were surrounded by cellulitis which was marked in an outline and is resolving since systemic antibiotics have been started. Measurement: Left leg .2X.2X.2cm, Right leg .3X.3X.2cm; both are red and moist, small amt tan drainage, no odor or fluctuance, painful to touch. Dressing procedure/placement/frequency: Foam dressing to protect from further injury and absorb drainage to promote healing. Please re-consult if further assistance is needed.  Thank-you,  Cammie Mcgeeawn Jaiyanna Safran MSN, RN, CWOCN, [place]WCN-AP, CNS [phone]

## 2018-07-19 NOTE — Progress Notes (Signed)
[facility] He has active Medicaid.  Ulyses SouthwardMinh Pham, PharmD, BCIDP, AAHIVP, CPP Infectious Disease Pharmacist 07/19/2018 4:09 PM

## 2018-07-19 NOTE — H&P (Signed)
History and Physical    Jacob Gutierrez WUJ:[id number] DOB: 09/17/1985 DOA: 07/18/2018  PCP: Grayce SessionsEdwards, Michelle P, NP  Patient coming from: Homeless  I have personally briefly reviewed [facility]  Chief Complaint: Leg pain  HPI: Jacob ColumbiaDesmond J Gutierrez is a 32 y.o. male with medical history significant of AIDS CD4 count 30 last month, not currently on HAART as he didn't fill meds after discharge.  Admitted last month for Scrotal abscess with MRSA required LZD treatment due to Vanc allergy and resistances to doxy / bactrim.  Seen on 12/23 diagnosed with "foliculitis" of B legs but hasnt started on ABx yet.  For past 6-8 hours has worsening pain, redness, swelling, and purulent discharge from B shins.   ED Course: Has B cellulitis and abscesses on shins draining purulent material.  Has fever of 101.6.  Has UTI.  Also has h/o ESBL.  H/o Prostate abscess with GPC in [place] back in May of this year.  Fungal culture at that time ended up growing C. Albicans though not sure if that's significant or not.  Review of Systems: As per HPI otherwise 10 point review of systems negative.   Past Medical History:  Diagnosis Date  . 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 H/O coccidioidomycosis    pulmonary   . HIV disease (HCC)   . Past history of allergy to penicillin-type antibiotic 07-03-2012   desensitization  . PNA (pneumonia)   . Shigella gastroenteritis   . Syphilis    history /treated     Past Surgical History:  Procedure Laterality Date  . TRANSURETHRAL RESECTION OF PROSTATE N/A 12/09/2017   Procedure: TRANSURETHRAL RESECTION OF THE PROSTATE (TURP);  Surgeon: Crist FatHerrick, Benjamin W, MD;  Location: [facility];  Service: Urology;  Laterality: N/A;     reports that he has been smoking cigarettes. He has been smoking about 0.50 packs per day. He has never used smokeless tobacco. He reports current alcohol use of about  1.0 standard drinks of alcohol per week. He reports current drug use. Frequency: 1.00 time per week. Drug: Marijuana.  Allergies  Allergen Reactions  . Amoxicillin Other (See Comments)    From childhood: "I had a reaction when i was little." (??)  . Vancomycin Itching    Angioedema    Family History  Problem Relation Age of Onset  . Hypertension Mother   . Lupus Maternal Grandmother   . Cancer Paternal Grandfather      Prior to Admission medications   Medication Sig Start Date End Date Taking? Authorizing Provider  bictegravir-emtricitabine-tenofovir AF (BIKTARVY) 50-200-25 MG TABS tablet Take 1 tablet by mouth daily. 06/11/18   Levora DredgeHelberg, Justin, MD  cephALEXin (KEFLEX) 500 MG capsule Take 1 capsule (500 mg total) by mouth 4 (four) times daily. 07/15/18   Janne NapoleonNeese, Hope M, NP  sulfamethoxazole-trimethoprim (BACTRIM DS,SEPTRA DS) 800-160 MG tablet Take 1 tablet by mouth daily. 06/22/18 10/20/18  Levora DredgeHelberg, Justin, MD  sulfamethoxazole-trimethoprim (BACTRIM DS,SEPTRA DS) 800-160 MG tablet Take 1 tablet by mouth 2 (two) times daily for 10 days. Patient not taking: Reported on 07/18/2018 06/11/18 07/18/26  Levora DredgeHelberg, Justin, MD  sulfamethoxazole-trimethoprim (BACTRIM DS,SEPTRA DS) 800-160 MG tablet Take 1 tablet by mouth 2 (two) times daily for 7 days. 07/15/18 07/22/18  Janne NapoleonNeese, Hope M, NP    Physical Exam: Vitals:   07/18/18 2333 07/18/18 2345 07/19/18 0000 07/19/18 0015  BP: 129/83 120/80 125/76 (!) 126/111  Pulse: (!) 101 96  98 (!) 108  Resp: 16 (!) 26 16 (!) 24  Temp:      TempSrc:      SpO2: 98% 98% 98% 98%  Weight:      Height:        Constitutional: NAD, calm, comfortable Eyes: PERRL, lids and conjunctivae normal ENMT: Mucous membranes are moist. Posterior pharynx clear of any exudate or lesions.Normal dentition.  Neck: normal, supple, no masses, no thyromegaly Respiratory: clear to auscultation bilaterally, no wheezing, no crackles. Normal respiratory effort. No accessory  muscle use.  Cardiovascular: Regular rate and rhythm, no murmurs / rubs / gallops. No extremity edema. 2+ pedal pulses. No carotid bruits.  Abdomen: no tenderness, no masses palpated. No hepatosplenomegaly. Bowel sounds positive.  Musculoskeletal: no clubbing / cyanosis. No joint deformity upper and lower extremities. Good ROM, no contractures. Normal muscle tone.  Skin: Bilateral anterior lower leg abscesses, open and draining purulent material.   Document Information   Photos    07/18/2018 21:58  Attached To:  Hospital Encounter on 07/18/18  Source Information   Norman ClayHammond, Elizabeth W, PA-C  [facility]    Neurologic: CN 2-12 grossly intact. Sensation intact, DTR normal. Strength 5/5 in all 4.  Psychiatric: Normal judgment and insight. Alert and oriented x 3. Normal mood.    Labs on Admission: I have personally reviewed following labs and imaging studies  CBC: Recent Labs  Lab 07/15/18 1548 07/18/18 2237  WBC 4.3 6.4  NEUTROABS 2.6 4.6  HGB 12.0* 13.3  HCT 36.1* 39.9  MCV 94.5 95.7  PLT 253 235   Basic Metabolic Panel: Recent Labs  Lab 07/15/18 1548 07/18/18 2237  NA 136 138  K 3.9 4.3  CL 102 104  CO2 23 23  GLUCOSE 90 94  BUN 15 19  CREATININE 0.60* 0.79  CALCIUM 8.6* 8.7*   GFR: Estimated Creatinine Clearance: 147 mL/min (by C-G formula based on SCr of 0.79 mg/dL). Liver Function Tests: No results for input(s): AST, ALT, ALKPHOS, BILITOT, PROT, ALBUMIN in the last 168 hours. No results for input(s): LIPASE, AMYLASE in the last 168 hours. No results for input(s): AMMONIA in the last 168 hours. Coagulation Profile: No results for input(s): INR, PROTIME in the last 168 hours. Cardiac Enzymes: No results for input(s): CKTOTAL, CKMB, CKMBINDEX, TROPONINI in the last 168 hours. BNP (last 3 results) No results for input(s): PROBNP in the last 8760 hours. HbA1C: No results for input(s): HGBA1C in the last 72 hours. CBG: No results for input(s):  GLUCAP in the last 168 hours. Lipid Profile: No results for input(s): CHOL, HDL, LDLCALC, TRIG, CHOLHDL, LDLDIRECT in the last 72 hours. Thyroid Function Tests: No results for input(s): TSH, T4TOTAL, FREET4, T3FREE, THYROIDAB in the last 72 hours. Anemia Panel: No results for input(s): VITAMINB12, FOLATE, FERRITIN, TIBC, IRON, RETICCTPCT in the last 72 hours. Urine analysis:    Component Value Date/Time   COLORURINE AMBER (A) 07/18/2018 2338   APPEARANCEUR HAZY (A) 07/18/2018 2338   APPEARANCEUR Hazy 07/27/2013 1742   LABSPEC 1.034 (H) 07/18/2018 2338   LABSPEC 1.026 07/27/2013 1742   PHURINE 5.0 07/18/2018 2338   GLUCOSEU NEGATIVE 07/18/2018 2338   GLUCOSEU Negative 07/27/2013 1742   HGBUR SMALL (A) 07/18/2018 2338   BILIRUBINUR NEGATIVE 07/18/2018 2338   BILIRUBINUR Negative 07/27/2013 1742   KETONESUR NEGATIVE 07/18/2018 2338   PROTEINUR 100 (A) 07/18/2018 2338   UROBILINOGEN 1 12/17/2013 1118   NITRITE NEGATIVE 07/18/2018 2338   LEUKOCYTESUR LARGE (A) 07/18/2018 2338   LEUKOCYTESUR Negative  07/27/2013 1742    Radiological Exams on Admission: No results found.  EKG: Independently reviewed.  Assessment/Plan Principal Problem:   Sepsis (HCC) Active Problems:   AIDS (acquired immune deficiency syndrome) (HCC)   Homelessness   Personal history of MRSA (methicillin resistant Staphylococcus aureus)   History of ESBL E. coli infection   Cellulitis and abscess of right leg   Cellulitis and abscess of left leg   Acute lower UTI    1. Sepsis -  1. Most worried about diseminated MRSA infection due to not taking LZD nor HAART after discharge in Nov. 2. IVF: NS at 125 cc/hr, 1L bolus in ED 3. BCx - dont hear a heart murmur today... 4. Tylenol PRN fever 5. See cellulitis and UTI discussions below 2. Cellulitis and abscesses of B legs - h/o MRSA last month 1. Wound Cx 2. LZD 1 dose given, allergy to vanc, they had him on LZD during Nov admission for scrotal MRSA  abscess 3. ID consult in AM 3. UTI - h/o ESBL e.coli in past 1. Adding merrem 2. UCx 4. HIV/AIDS - ID consult in AM, will hold off on starting HAART for the moment until ID evaluates in AM since he hasnt started this since discharge.    DVT prophylaxis: Lovenox Code Status: Full Family Communication: No family in room Disposition Plan: TBD Consults called: None, Call ID in AM Admission status: Admit to inpatient  Severity of Illness: The appropriate patient status for this patient is INPATIENT. Inpatient status is judged to be reasonable and necessary in order to provide the required intensity of service to ensure the patient's safety. The patient's presenting symptoms, physical exam findings, and initial radiographic and laboratory data in the context of their chronic comorbidities is felt to place them at high risk for further clinical deterioration. Furthermore, it is not anticipated that the patient will be medically stable for discharge from the hospital within 2 midnights of admission. The following factors support the patient status of inpatient.   " The patient's presenting symptoms include BLE abscesses, fever. " The worrisome physical exam findings include BLE abscesses. " The initial radiographic and laboratory data are worrisome because of BLE abscesses, UTI. " The chronic co-morbidities include HIV/AIDS, allergy to vanc, MRSA last month that needed LZD treatment, h/o ESBL.   * I certify that at the point of admission it is my clinical judgment that the patient will require inpatient hospital care spanning beyond 2 midnights from the point of admission due to high intensity of service, high risk for further deterioration and high frequency of surveillance required.Hillary Bow DO [facility] Pager [phone] Only works nights!  If 7AM-7PM, please contact the primary day team physician taking care of patient  [email or web address] Password [facility]  07/19/2018,  12:23 AM

## 2018-07-19 NOTE — Progress Notes (Signed)
Pharmacy Antibiotic Note  Jacob Gutierrez is a 32 y.o. male admitted on 07/18/2018 with cellulitis.  Pharmacy has been consulted for meropenem dosing.  Plan: Meropenem 1gm IV q8 hours F/u cultures and clinical course  Height: 6\' 1"  (185.4 cm) Weight: 190 lb (86.2 kg) IBW/kg (Calculated) : 79.9  Temp (24hrs), Avg:100 F (37.8 C), Min:98.3 F (36.8 C), Max:101.6 F (38.7 C)  Recent Labs  Lab 07/15/18 1548 07/18/18 2237 07/18/18 2358  WBC 4.3 6.4  --   CREATININE 0.60* 0.79  --   LATICACIDVEN  --   --  1.66    Estimated Creatinine Clearance: 147 mL/min (by C-G formula based on SCr of 0.79 mg/dL).    Allergies  Allergen Reactions  . Amoxicillin Other (See Comments)    From childhood: "I had a reaction when i was little." (??)  . Vancomycin Itching    Angioedema     Thank you for allowing pharmacy to be a part of this patient's care.  Talbert CageSeay, Hue Frick Poteet 07/19/2018 12:10 AM

## 2018-07-20 DIAGNOSIS — L739 Follicular disorder, unspecified: Secondary | ICD-10-CM

## 2018-07-20 DIAGNOSIS — L03115 Cellulitis of right lower limb: Secondary | ICD-10-CM

## 2018-07-20 DIAGNOSIS — Z881 Allergy status to other antibiotic agents status: Secondary | ICD-10-CM

## 2018-07-20 DIAGNOSIS — Z590 Homelessness: Secondary | ICD-10-CM

## 2018-07-20 DIAGNOSIS — Z8614 Personal history of Methicillin resistant Staphylococcus aureus infection: Secondary | ICD-10-CM

## 2018-07-20 DIAGNOSIS — L03116 Cellulitis of left lower limb: Secondary | ICD-10-CM

## 2018-07-20 DIAGNOSIS — B20 Human immunodeficiency virus [HIV] disease: Principal | ICD-10-CM

## 2018-07-20 DIAGNOSIS — F17210 Nicotine dependence, cigarettes, uncomplicated: Secondary | ICD-10-CM

## 2018-07-20 LAB — BASIC METABOLIC PANEL
Anion gap: 9 (ref 5–15)
BUN: 12 mg/dL (ref 6–20)
CO2: 25 mmol/L (ref 22–32)
Calcium: 8.4 mg/dL — ABNORMAL LOW (ref 8.9–10.3)
Chloride: 102 mmol/L (ref 98–111)
Creatinine, Ser: 0.68 mg/dL (ref 0.61–1.24)
GFR calc Af Amer: >60 mL/min (ref >60)
GFR calc non Af Amer: >60 mL/min (ref >60)
Glucose, Bld: 118 mg/dL — ABNORMAL HIGH (ref 70–99)
Potassium: 4.2 mmol/L (ref 3.5–5.1)
Sodium: 136 mmol/L (ref 135–145)

## 2018-07-20 LAB — T-HELPER CELLS (CD4) COUNT (NOT AT ARMC)
CD4 T CELL HELPER: 3 % — AB (ref 33–55)
CD4 T Cell Abs: 10 /uL — ABNORMAL LOW (ref 400–2700)

## 2018-07-20 LAB — URINE CULTURE: Culture: <10000 — AB

## 2018-07-20 MED ORDER — SULFAMETHOXAZOLE-TRIMETHOPRIM 400-80 MG PO TABS: 1.0000 | ORAL_TABLET | Freq: Every day | ORAL | 0 refills | Status: DC

## 2018-07-20 MED ORDER — CHLORHEXIDINE GLUCONATE CLOTH 2 % EX PADS
6.0000 | MEDICATED_PAD | Freq: Every day | CUTANEOUS | Status: AC
  Administered 2018-07-20 – 2018-07-24 (×5): 6 via TOPICAL

## 2018-07-20 MED ORDER — CLINDAMYCIN PHOSPHATE 600 MG/50ML IV SOLN
600.0000 mg | Freq: Three times a day (TID) | INTRAVENOUS | Status: AC
  Administered 2018-07-20 – 2018-07-22 (×6): 600 mg via INTRAVENOUS
  Filled 2018-07-20 (×6): qty 50

## 2018-07-20 MED ORDER — MUPIROCIN 2 % EX OINT
1.0000 "application " | TOPICAL_OINTMENT | Freq: Two times a day (BID) | CUTANEOUS | Status: DC
  Administered 2018-07-20 – 2018-07-24 (×9): 1 via NASAL
  Filled 2018-07-20: qty 22

## 2018-07-20 MED ORDER — MUPIROCIN 2 % EX OINT
TOPICAL_OINTMENT | CUTANEOUS | Status: AC
  Filled 2018-07-20: qty 22

## 2018-07-20 MED FILL — SULFAMETHOXAZOLE-TMP DS TAB: 800-160 | 30 days supply | Qty: 15 | Fill #0

## 2018-07-20 NOTE — Progress Notes (Signed)
PROGRESS NOTE    Jacob Gutierrez  ZHY:[id number] DOB: 03-12-1986 DOA: 07/18/2018 PCP: Grayce Sessions, NP   Brief Narrative:  Patient is a 32 y.o. male with history of HIV (last CD4 count 30 on 04/24/2018)-noncompliant to antiretrovirals, recent history of scrotal abscess with MRSA-presenting with fever, bilateral lower extremity cellulitis.  Started on empiric antimicrobial therapy and admitted to the hospitalist service.  See below for further details  Assessment & Plan:   Principal Problem:   Sepsis (HCC) Active Problems:   AIDS (acquired immune deficiency syndrome) (HCC)   Homelessness   Personal history of MRSA (methicillin resistant Staphylococcus aureus)   History of ESBL E. coli infection   Cellulitis and abscess of right leg   Cellulitis and abscess of left leg   Acute lower UTI   1. Sepsis secondary to bilateral lower extremity soft tissue infection.  Patient is noted to have small abscesses in the bilateral lower extremities that are draining without any ongoing fluctuation and requires ongoing wound care.  Difficult to discharge to home as he is homeless and will have poor care for these wounds even despite oral antibiotics.  Continue on Zyvox per ID will continue to follow later today.  Final cultures still pending with gram-positive cocci noted in wounds.  Patient continues to have ongoing pain complaints related to these wounds. 2. Asymptomatic bacteriuria.  No urine culture sent previously.  Continue to monitor for further symptoms.  Maintain on Zyvox. 3. HIV with noncompliance on antiretrovirals.  Dr. Luciana Axe of ID to arrange for outpatient treatment.  Maintain on prophylactic Bactrim for now.  HIV RNA as well as CD4 count ordered today and pending. 4. Recent history of scrotal infection.  Scrotal area appears to be unremarkable patient is noted to be positive for MRSA.  He has finished a course of Zyvox for this.  Appears to have recurrent skin infections.   DVT  prophylaxis: Lovenox Code Status: Full Family Communication: None at bedside Disposition Plan: Continue on IV antibiotics per discussion with ID who will follow up later today.  HIV RNA/CD4 ordered per ID today.  Patient is unlikely to do well if discharged due to homelessness and has already failed outpatient treatment once.  We will continue to monitor in the inpatient setting until further plans are solidified.   Consultants:   ID  Procedures:   None  Antimicrobials:   IV Zyvox 12/26->  IV Merrem 12/26- 12/26  IV cefepime 12/26-12/26  Bactrim 12/26->   Subjective: Patient seen and evaluated today with ongoing lower extremity wound drainage and pain noted. No acute concerns or events noted overnight.  Objective: Vitals:   07/19/18 0524 07/19/18 1416 07/19/18 1956 07/20/18 0457  BP: 118/73 125/72 129/88 106/69  Pulse: 91 78 86 89  Resp: 16 18 14 15   Temp: 98.8 F (37.1 C) 98.7 F (37.1 C) 98.3 F (36.8 C) 98.3 F (36.8 C)  TempSrc: Oral Oral Oral Oral  SpO2: 100% 100% 98% 98%  Weight:      Height:        Intake/Output Summary (Last 24 hours) at 07/20/2018 1051 Last data filed at 07/20/2018 0500 Gross per 24 hour  Intake 480 ml  Output 3800 ml  Net -3320 ml   Filed Weights   07/18/18 2005 07/18/18 2011  Weight: 86.2 kg 86.2 kg    Examination:  General exam: Appears calm and comfortable  Respiratory system: Clear to auscultation. Respiratory effort normal. Cardiovascular system: S1 & S2 heard, RRR. No JVD,  murmurs, rubs, gallops or clicks. No pedal edema. Gastrointestinal system: Abdomen is nondistended, soft and nontender. No organomegaly or masses felt. Normal bowel sounds heard. Central nervous system: Alert and oriented. No focal neurological deficits. Extremities: Symmetric 5 x 5 power. Skin: Lower extremity wounds with dressings that are clean dry and intact. Psychiatry: Judgement and insight appear normal. Mood & affect appropriate.     Data  Reviewed: I have personally reviewed following labs and imaging studies  CBC: Recent Labs  Lab 07/15/18 1548 07/18/18 2237 07/19/18 0222  WBC 4.3 6.4 5.6  NEUTROABS 2.6 4.6  --   HGB 12.0* 13.3 11.4*  HCT 36.1* 39.9 33.3*  MCV 94.5 95.7 92.5  PLT 253 235 224   Basic Metabolic Panel: Recent Labs  Lab 07/15/18 1548 07/18/18 2237 07/19/18 0222 07/20/18 0138  NA 136 138 135 136  K 3.9 4.3 3.7 4.2  CL 102 104 104 102  CO2 23 23 22 25   GLUCOSE 90 94 106* 118*  BUN 15 19 16 12   CREATININE 0.60* 0.79 0.65 0.68  CALCIUM 8.6* 8.7* 8.0* 8.4*   GFR: Estimated Creatinine Clearance: 147 mL/min (by C-G formula based on SCr of 0.68 mg/dL). Liver Function Tests: Recent Labs  Lab 07/18/18 2237  AST 28  ALT 15  ALKPHOS 66  BILITOT 0.7  PROT 8.5*  ALBUMIN 3.3*   No results for input(s): LIPASE, AMYLASE in the last 168 hours. No results for input(s): AMMONIA in the last 168 hours. Coagulation Profile: No results for input(s): INR, PROTIME in the last 168 hours. Cardiac Enzymes: No results for input(s): CKTOTAL, CKMB, CKMBINDEX, TROPONINI in the last 168 hours. BNP (last 3 results) No results for input(s): PROBNP in the last 8760 hours. HbA1C: No results for input(s): HGBA1C in the last 72 hours. CBG: No results for input(s): GLUCAP in the last 168 hours. Lipid Profile: No results for input(s): CHOL, HDL, LDLCALC, TRIG, CHOLHDL, LDLDIRECT in the last 72 hours. Thyroid Function Tests: No results for input(s): TSH, T4TOTAL, FREET4, T3FREE, THYROIDAB in the last 72 hours. Anemia Panel: No results for input(s): VITAMINB12, FOLATE, FERRITIN, TIBC, IRON, RETICCTPCT in the last 72 hours. Sepsis Labs: Recent Labs  Lab 07/18/18 2358  LATICACIDVEN 1.66    Recent Results (from the past 240 hour(s))  Aerobic Culture (superficial specimen)     Status: None (Preliminary result)   Collection Time: 07/18/18 10:09 PM  Result Value Ref Range Status   Specimen Description WOUND LEFT  LEG  Final   Special Requests Immunocompromised  Final   Gram Stain   Final    MODERATE WBC PRESENT, PREDOMINANTLY PMN ABUNDANT GRAM POSITIVE COCCI    Culture   Final    TOO YOUNG TO READ Performed at [facility], [address] [place], [place] [address]    Report Status PENDING  Incomplete  Aerobic Culture (superficial specimen)     Status: None (Preliminary result)   Collection Time: 07/18/18 10:09 PM  Result Value Ref Range Status   Specimen Description WOUND RIGHT LEG  Final   Special Requests Immunocompromised  Final   Gram Stain   Final    MODERATE WBC PRESENT, PREDOMINANTLY PMN MODERATE GRAM POSITIVE COCCI FEW GRAM POSITIVE RODS    Culture   Final    TOO YOUNG TO READ Performed at [facility], [address] [place], [place] [address]    Report Status PENDING  Incomplete  MRSA PCR Screening     Status: Abnormal  Collection Time: 07/19/18  2:23 AM  Result Value Ref Range Status   MRSA by PCR POSITIVE (A) NEGATIVE Final    Comment:        The GeneXpert MRSA Assay (FDA approved for NASAL specimens only), is one component of a comprehensive MRSA colonization surveillance program. It is not intended to diagnose MRSA infection nor to guide or monitor treatment for MRSA infections. RESULT CALLED TO, READ BACK BY AND VERIFIED WITH: RN C MASON [phone] MLM          Radiology Studies: No results found.      Scheduled Meds: . acetaminophen  1,000 mg Oral Once  . Chlorhexidine Gluconate Cloth  6 each Topical Q0600  . enoxaparin (LOVENOX) injection  40 mg Subcutaneous Daily  . mupirocin ointment  1 application Nasal BID  . sulfamethoxazole-trimethoprim  1 tablet Oral Daily   Continuous Infusions: . sodium chloride 50 mL/hr at 07/19/18 1230  . linezolid (ZYVOX) IV 600 mg (07/20/18 0936)     LOS: 1 day    Time spent: 30 minutes    Sharnae Winfree Hoover Brunette Mak Bonny, DO [facility] Pager [phone]  If 7PM-7AM, please contact  night-coverage [email or web address] Password [email or web address] 07/20/2018, 10:51 AM

## 2018-07-20 NOTE — Plan of Care (Signed)
  Problem: Clinical Measurements: Goal: Ability to maintain clinical measurements within normal limits will improve Outcome: Progressing   Problem: Activity: Goal: Risk for activity intolerance will decrease Outcome: Progressing   

## 2018-07-20 NOTE — Consult Note (Signed)
[facility]    Date of Admission:  07/18/2018     Total days of antibiotics 2 Linezolid (3 doses of meropenem but D/Cd)             Reason for Consult: HIV/AIDS, recurrent skin abscesses   Referring Provider: Maurilio LovelyPratik Shah  Primary Care Provider: Grayce SessionsEdwards, Michelle P, NP    Assessment: [name] is a 32 y.o. man with HIV disease with AIDS criteria (CD4 30 in October). He is supposed to be on Biktarvy once daily however has been off medications at least a month. He has had a falling out with his parents and has been homeless living in a variety of hotels since I last saw him over the summer. He has had many episodes of these recurrent skin infections likely due in part   Plan: 1. Continue linezolid - change to PO when able 2. Start Clindamycin 600 mg Q8h IV x 48h to help with inflammatory response  3. Continue bactrim DS 1 tab QD and Azithromycin 1200 mg QWeek for OI prophylaxis  4. Continue Biktarvy  5. Pain management per primary team - appreciated!  Principal Problem:   Sepsis (HCC) Active Problems:   AIDS (acquired immune deficiency syndrome) (HCC)   Homelessness   Personal history of MRSA (methicillin resistant Staphylococcus aureus)   History of ESBL E. coli infection   Cellulitis and abscess of right leg   Cellulitis and abscess of left leg   Acute lower UTI   . acetaminophen  1,000 mg Oral Once  . Chlorhexidine Gluconate Cloth  6 each Topical Q0600  . enoxaparin (LOVENOX) injection  40 mg Subcutaneous Daily  . mupirocin ointment  1 application Nasal BID  . sulfamethoxazole-trimethoprim  1 tablet Oral Daily    HPI: Jacob Gutierrez is a 32 y.o. 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   [name] has had 4  visits to ER/Hospital for care of recurrent skin abscesses (scrotom previously and now b/l lower extremities). He was seen in ER on 11/15 for scrotal abscess (4.5 cm) that required formal I&D. He was given linezolid and cefepime empirically until cultures grew MRSA --> was instructed to increased his bactrim to 1 DS tab BID x 10 days then reduce back. Isolate notable for resistance to bactrim and doxycycline. Dr. Drue SecondSnider called in linezolid 600 mg BID x 10d on 11/21 due to mismatch however [name] was not able to pick up medication. No show with Dr. Daiva EvesVan Dam on 12/02.   ER 12/22 for folliculitis to b/l upper legs and scrotum. At the time there was no fluctuance or drainable foci and no signs of systemic illness. He was prescribed keflex TID and told to follow up with ID. Presented again 3 days later with fevers and worsened swelling/cellulitis of the bilateral anterior shins with purulent drainage. Was found to be febrioel to 101.6 F, normal WBC count (although immune suppressed), tachycardic. Started on linezolid and meropenem empirically for sepsis syndrome r/t cellulitis and possibly UTI (no urinary symptoms described). Blood cultures drawn and pending still.   He is currently homeless; when I last saw him he was working with our [facility] counselor to help with resources and retention.    Review of Systems  All other systems reviewed and are negative.  Past Medical History:  Diagnosis Date  . 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 H/O coccidioidomycosis    pulmonary   . HIV disease (HCC)   . Past history of allergy to penicillin-type antibiotic 07-03-2012   desensitization  . PNA (pneumonia)   . Shigella gastroenteritis   . Syphilis    history /treated     Social History   Tobacco Use  . Smoking status: Current Every Day Smoker    Packs/day: 0.50    Types: Cigarettes  . Smokeless tobacco: Never Used  . Tobacco comment: cutting back  Substance  Use Topics  . Alcohol use: Yes    Alcohol/week: 1.0 standard drinks    Types: 1 Standard drinks or equivalent per week    Comment: whiskey occasionally   . Drug use: Yes    Frequency: 1.0 times per week    Types: Marijuana    Family History  Problem Relation Age of Onset  . Hypertension Mother   . Lupus Maternal Grandmother   . Cancer Paternal Grandfather    Allergies  Allergen Reactions  . Amoxicillin Other (See Comments)    From childhood: "I had a reaction when i was little." (??)  . Vancomycin Itching    Angioedema    OBJECTIVE: Blood pressure 106/69, pulse 89, temperature 98.3 F (36.8 C), temperature source Oral, resp. rate 15, height 6\' 1"  (1.854 m), weight 86.2 kg, SpO2 98 %.  Physical Exam Vitals signs and nursing note reviewed.  Constitutional:      Appearance: Normal appearance. He is ill-appearing (chronically ).  HENT:     Head:     Comments: Facial wasting/lipoatrophy     Mouth/Throat:     Mouth: Mucous membranes are moist.     Pharynx: Oropharynx is clear. No oropharyngeal exudate.  Eyes:     General: No scleral icterus. Neck:     Musculoskeletal: Normal range of motion.  Cardiovascular:     Rate and Rhythm: Normal rate and regular rhythm.     Heart sounds: No murmur.  Pulmonary:     Effort: Pulmonary effort is normal.  Abdominal:     General: Abdomen is flat. There is no distension.     Palpations: There is no mass.  Musculoskeletal:        General: Swelling present.     Comments:    Skin:    General: Skin is warm and dry.     Comments: Left lateral shin - surrounding warmth, tenderness and erythema. No organized fluctuance that requires drainage; all appears soft tissue in nature. Very tender w/o crepitus.  Right lateral shin - degloved/opened skin indicating previous rupture of likely large abscess. Bloody drainage. Subcutaneous tissue visualized in wound bed. Surrounding warmth, erythema but less tender Multiple scarred areas from previous  abscess ruptures. Folliculitis present at upper thighs.   Neurological:     Mental Status: He is alert.     Lab Results Lab Results  Component Value Date   WBC 5.6 07/19/2018   HGB 11.4 (L) 07/19/2018   HCT 33.3 (L) 07/19/2018   MCV 92.5 07/19/2018   PLT 224 07/19/2018    Lab Results  Component Value Date   CREATININE 0.68 07/20/2018   BUN 12 07/20/2018   NA 136 07/20/2018   K 4.2 07/20/2018   CL 102 07/20/2018   CO2 25 07/20/2018    Lab Results  Component Value Date   ALT 15 07/18/2018   AST 28 07/18/2018  ALKPHOS 66 07/18/2018   BILITOT 0.7 07/18/2018     Microbiology: Recent Results (from the past 240 hour(s))  Aerobic Culture (superficial specimen)     Status: None (Preliminary result)   Collection Time: 07/18/18 10:09 PM  Result Value Ref Range Status   Specimen Description WOUND LEFT LEG  Final   Special Requests Immunocompromised  Final   Gram Stain   Final    MODERATE WBC PRESENT, PREDOMINANTLY PMN ABUNDANT GRAM POSITIVE COCCI    Culture   Final    TOO YOUNG TO READ Performed at [facility], [address] [place], [place] [address]    Report Status PENDING  Incomplete  Aerobic Culture (superficial specimen)     Status: None (Preliminary result)   Collection Time: 07/18/18 10:09 PM  Result Value Ref Range Status   Specimen Description WOUND RIGHT LEG  Final   Special Requests Immunocompromised  Final   Gram Stain   Final    MODERATE WBC PRESENT, PREDOMINANTLY PMN MODERATE GRAM POSITIVE COCCI FEW GRAM POSITIVE RODS    Culture   Final    TOO YOUNG TO READ Performed at [facility], [address], [place], [place] [address]    Report Status PENDING  Incomplete  MRSA PCR Screening     Status: Abnormal   Collection Time: 07/19/18  2:23 AM  Result Value Ref Range Status   MRSA by PCR POSITIVE (A) NEGATIVE Final    Comment:        The GeneXpert MRSA Assay (FDA approved for NASAL specimens only), is one component of  a comprehensive MRSA colonization surveillance program. It is not intended to diagnose MRSA infection nor to guide or monitor treatment for MRSA infections. RESULT CALLED TO, READ BACK BY AND VERIFIED WITH: RN Erven Colla [phone] MLM     Rexene Alberts, MSN, NP-C [facility] for Infectious Disease [facility] Cell: [phone] Pager: [phone]  07/20/2018 10:55 AM

## 2018-07-21 LAB — HIV-1 RNA QUANT-NO REFLEX-BLD
HIV 1 RNA QUANT: 1220000 {copies}/mL
LOG10 HIV-1 RNA: 6.086 {Log_copies}/mL

## 2018-07-21 LAB — AEROBIC CULTURE W GRAM STAIN (SUPERFICIAL SPECIMEN)

## 2018-07-21 LAB — BASIC METABOLIC PANEL
Anion gap: 12 (ref 5–15)
BUN: 12 mg/dL (ref 6–20)
CO2: 21 mmol/L — ABNORMAL LOW (ref 22–32)
Calcium: 8.3 mg/dL — ABNORMAL LOW (ref 8.9–10.3)
Chloride: 100 mmol/L (ref 98–111)
Creatinine, Ser: 0.6 mg/dL — ABNORMAL LOW (ref 0.61–1.24)
GFR calc Af Amer: >60 mL/min (ref >60)
GFR calc non Af Amer: >60 mL/min (ref >60)
Glucose, Bld: 104 mg/dL — ABNORMAL HIGH (ref 70–99)
Potassium: 4.3 mmol/L (ref 3.5–5.1)
SODIUM: 133 mmol/L — AB (ref 135–145)

## 2018-07-21 LAB — AEROBIC CULTURE  (SUPERFICIAL SPECIMEN)

## 2018-07-21 LAB — CBC
HCT: 34.6 % — ABNORMAL LOW (ref 39.0–52.0)
Hemoglobin: 11.4 g/dL — ABNORMAL LOW (ref 13.0–17.0)
MCH: 30.9 pg (ref 26.0–34.0)
MCHC: 32.9 g/dL (ref 30.0–36.0)
MCV: 93.8 fL (ref 80.0–100.0)
Platelets: 197 10*3/uL (ref 150–400)
RBC: 3.69 MIL/uL — ABNORMAL LOW (ref 4.22–5.81)
RDW: 13.9 % (ref 11.5–15.5)
WBC: 2.8 10*3/uL — ABNORMAL LOW (ref 4.0–10.5)
nRBC: 0 % (ref 0.0–0.2)

## 2018-07-21 MED ORDER — POLYETHYLENE GLYCOL 3350 17 G PO PACK: 17.0000 g | PACK | Freq: Every day | ORAL | Status: DC | PRN

## 2018-07-21 NOTE — Plan of Care (Signed)
  Problem: Education: Goal: Knowledge of General Education information will improve Description Including pain rating scale, medication(s)/side effects and non-pharmacologic comfort measures Outcome: Progressing   Problem: Clinical Measurements: Goal: Will remain free from infection Outcome: Progressing   Problem: Clinical Measurements: Goal: Diagnostic test results will improve Outcome: Progressing   Problem: Activity: Goal: Risk for activity intolerance will decrease Outcome: Progressing   Problem: Pain Managment: Goal: General experience of comfort will improve Outcome: Progressing   Problem: Safety: Goal: Ability to remain free from injury will improve Outcome: Progressing   Problem: Skin Integrity: Goal: Risk for impaired skin integrity will decrease Outcome: Progressing   

## 2018-07-21 NOTE — Progress Notes (Signed)
PROGRESS NOTE    Jacob Gutierrez  QMV:[id number] DOB: Mar 26, 1986 DOA: 07/18/2018 PCP: Grayce Sessions, NP   Brief Narrative:  Patient is a 32 y.o. male with history of HIV (last CD4 count 30 on 04/24/2018)-noncompliant to antiretrovirals, recent history of scrotal abscess with MRSA-presenting with fever, bilateral lower extremity cellulitis.  Started on empiric antimicrobial therapy and admitted to the hospitalist service.  See below for further details  Assessment & Plan:   Principal Problem:   Sepsis (HCC) Active Problems:   AIDS (acquired immune deficiency syndrome) (HCC)   Homelessness   Personal history of MRSA (methicillin resistant Staphylococcus aureus)   History of ESBL E. coli infection   Cellulitis and abscess of right leg   Cellulitis and abscess of left leg   Acute lower UTI   1. Sepsis secondary to bilateral lower extremity soft tissue infection. Patient is noted to have small abscesses in the bilateral lower extremities that are draining without any ongoing fluctuation and requires ongoing wound care.   Difficult to discharge to home as he is homeless and will have poor care for these wounds even despite oral antibiotics.   Continue on Zyvox and clindamycin. Appreciate ID consultant. Superficial culture is actually growing MRSA although not highly sensitive.  2. Asymptomatic bacteriuria.   No urine culture sent previously.  Continue to monitor for further symptoms.  Maintain on Zyvox.  3. HIV with noncompliance on antiretrovirals.   Dr. Luciana Axe of ID to arrange for outpatient treatment.  Maintain on prophylactic Bactrim for now.  HIV RNA as well as CD4 count severely low.  4. Recent history of scrotal infection.  Scrotal area appears to be unremarkable patient is noted to be positive for MRSA.  He has finished a course of Zyvox for this.  Appears to have recurrent skin infections.   DVT prophylaxis: Lovenox Code Status: Full Family Communication: None at  bedside Disposition Plan: Continue on IV antibiotics per discussion with ID who will follow up later today.  Patient is unlikely to do well if discharged due to homelessness and has already failed outpatient treatment once.  We will continue to monitor in the inpatient setting until further plans are solidified.   Consultants:   ID  Procedures:   None  Antimicrobials:   IV Zyvox 12/26->  IV Merrem 12/26- 12/26  IV cefepime 12/26-12/26  Bactrim 12/26->   Subjective: Continues to have drainage from bilateral wound.  Pain remains a problem.  No nausea no vomiting.  Objective: Vitals:   07/20/18 1809 07/20/18 1959 07/21/18 0536 07/21/18 1316  BP:  116/74 117/74 133/83  Pulse:  90 84 88  Resp:  16  18  Temp: 99.3 F (37.4 C) 98.7 F (37.1 C) 98.7 F (37.1 C) 98.8 F (37.1 C)  TempSrc: Oral Oral Oral Oral  SpO2:  99% 99% 100%  Weight:      Height:        Intake/Output Summary (Last 24 hours) at 07/21/2018 1648 Last data filed at 07/21/2018 1300 Gross per 24 hour  Intake 8070.3 ml  Output 3200 ml  Net 4870.3 ml   Filed Weights   07/18/18 2005 07/18/18 2011  Weight: 86.2 kg 86.2 kg    Examination:  General exam: Appears calm and comfortable  Respiratory system: Clear to auscultation. Respiratory effort normal. Cardiovascular system: S1 & S2 heard, RRR. No JVD, murmurs, rubs, gallops or clicks. No pedal edema. Gastrointestinal system: Abdomen is nondistended, soft and nontender. No organomegaly or masses felt. Normal bowel  sounds heard. Central nervous system: Alert and oriented. No focal neurological deficits. Extremities: Symmetric 5 x 5 power. Skin: Lower extremity wounds with dressings that are clean dry and intact. Psychiatry: Judgement and insight appear normal. Mood & affect appropriate.     Data Reviewed: I have personally reviewed following labs and imaging studies  CBC: Recent Labs  Lab 07/15/18 1548 07/18/18 2237 07/19/18 0222  07/21/18 0409  WBC 4.3 6.4 5.6 2.8*  NEUTROABS 2.6 4.6  --   --   HGB 12.0* 13.3 11.4* 11.4*  HCT 36.1* 39.9 33.3* 34.6*  MCV 94.5 95.7 92.5 93.8  PLT 253 235 224 197   Basic Metabolic Panel: Recent Labs  Lab 07/15/18 1548 07/18/18 2237 07/19/18 0222 07/20/18 0138 07/21/18 0409  NA 136 138 135 136 133*  K 3.9 4.3 3.7 4.2 4.3  CL 102 104 104 102 100  CO2 23 23 22 25  21*  GLUCOSE 90 94 106* 118* 104*  BUN 15 19 16 12 12   CREATININE 0.60* 0.79 0.65 0.68 0.60*  CALCIUM 8.6* 8.7* 8.0* 8.4* 8.3*   GFR: Estimated Creatinine Clearance: 147 mL/min (A) (by C-G formula based on SCr of 0.6 mg/dL (L)). Liver Function Tests: Recent Labs  Lab 07/18/18 2237  AST 28  ALT 15  ALKPHOS 66  BILITOT 0.7  PROT 8.5*  ALBUMIN 3.3*   No results for input(s): LIPASE, AMYLASE in the last 168 hours. No results for input(s): AMMONIA in the last 168 hours. Coagulation Profile: No results for input(s): INR, PROTIME in the last 168 hours. Cardiac Enzymes: No results for input(s): CKTOTAL, CKMB, CKMBINDEX, TROPONINI in the last 168 hours. BNP (last 3 results) No results for input(s): PROBNP in the last 8760 hours. HbA1C: No results for input(s): HGBA1C in the last 72 hours. CBG: No results for input(s): GLUCAP in the last 168 hours. Lipid Profile: No results for input(s): CHOL, HDL, LDLCALC, TRIG, CHOLHDL, LDLDIRECT in the last 72 hours. Thyroid Function Tests: No results for input(s): TSH, T4TOTAL, FREET4, T3FREE, THYROIDAB in the last 72 hours. Anemia Panel: No results for input(s): VITAMINB12, FOLATE, FERRITIN, TIBC, IRON, RETICCTPCT in the last 72 hours. Sepsis Labs: Recent Labs  Lab 07/18/18 2358  LATICACIDVEN 1.66    Recent Results (from the past 240 hour(s))  Aerobic Culture (superficial specimen)     Status: None   Collection Time: 07/18/18 10:09 PM  Result Value Ref Range Status   Specimen Description WOUND LEFT LEG  Final   Special Requests Immunocompromised  Final    Gram Stain   Final    MODERATE WBC PRESENT, PREDOMINANTLY PMN ABUNDANT GRAM POSITIVE COCCI Performed at [facility], [address] [place], [place] [address]    Culture   Final    ABUNDANT METHICILLIN RESISTANT STAPHYLOCOCCUS AUREUS MODERATE STREPTOCOCCUS GROUP G    Report Status 07/21/2018 FINAL  Final   Organism ID, Bacteria METHICILLIN RESISTANT STAPHYLOCOCCUS AUREUS  Final      Susceptibility   Methicillin resistant staphylococcus aureus - MIC*    CIPROFLOXACIN >=8 RESISTANT Resistant     ERYTHROMYCIN >=8 RESISTANT Resistant     GENTAMICIN <=0.5 SENSITIVE Sensitive     OXACILLIN >=4 RESISTANT Resistant     TETRACYCLINE >=16 RESISTANT Resistant     VANCOMYCIN <=0.5 SENSITIVE Sensitive     TRIMETH/SULFA 20 SENSITIVE Sensitive     CLINDAMYCIN <=0.25 SENSITIVE Sensitive     RIFAMPIN <=0.5 SENSITIVE Sensitive     Inducible Clindamycin NEGATIVE Sensitive     *  ABUNDANT METHICILLIN RESISTANT STAPHYLOCOCCUS AUREUS  Aerobic Culture (superficial specimen)     Status: None   Collection Time: 07/18/18 10:09 PM  Result Value Ref Range Status   Specimen Description WOUND RIGHT LEG  Final   Special Requests Immunocompromised  Final   Gram Stain   Final    MODERATE WBC PRESENT, PREDOMINANTLY PMN MODERATE GRAM POSITIVE COCCI FEW GRAM POSITIVE RODS Performed at [facility], [address], [place], [place] [address]    Culture   Final    MODERATE METHICILLIN RESISTANT STAPHYLOCOCCUS AUREUS FEW STREPTOCOCCUS GROUP G    Report Status 07/21/2018 FINAL  Final   Organism ID, Bacteria METHICILLIN RESISTANT STAPHYLOCOCCUS AUREUS  Final      Susceptibility   Methicillin resistant staphylococcus aureus - MIC*    CIPROFLOXACIN >=8 RESISTANT Resistant     ERYTHROMYCIN >=8 RESISTANT Resistant     GENTAMICIN <=0.5 SENSITIVE Sensitive     OXACILLIN >=4 RESISTANT Resistant     TETRACYCLINE >=16 RESISTANT Resistant     VANCOMYCIN <=0.5 SENSITIVE Sensitive     TRIMETH/SULFA 20  SENSITIVE Sensitive     CLINDAMYCIN <=0.25 SENSITIVE Sensitive     RIFAMPIN <=0.5 SENSITIVE Sensitive     Inducible Clindamycin NEGATIVE Sensitive     * MODERATE METHICILLIN RESISTANT STAPHYLOCOCCUS AUREUS  Culture, blood (routine x 2)     Status: None (Preliminary result)   Collection Time: 07/18/18 11:45 PM  Result Value Ref Range Status   Specimen Description BLOOD RIGHT WRIST  Final   Special Requests   Final    BOTTLES DRAWN AEROBIC AND ANAEROBIC Blood Culture adequate volume   Culture   Final    NO GROWTH 2 DAYS Performed at [facility], [address] [place], [place] [address]    Report Status PENDING  Incomplete  Culture, blood (routine x 2)     Status: None (Preliminary result)   Collection Time: 07/19/18 12:01 AM  Result Value Ref Range Status   Specimen Description BLOOD LEFT WRIST  Final   Special Requests   Final    BOTTLES DRAWN AEROBIC AND ANAEROBIC Blood Culture adequate volume   Culture   Final    NO GROWTH 2 DAYS Performed at [facility], [address], [place], [place] [address]    Report Status PENDING  Incomplete  Culture, Urine     Status: Abnormal   Collection Time: 07/19/18 12:18 AM  Result Value Ref Range Status   Specimen Description URINE, RANDOM  Final   Special Requests NONE  Final   Culture (A)  Final    <10,000 COLONIES/mL INSIGNIFICANT GROWTH Performed at [facility], [address], [place], [place] [address]    Report Status 07/20/2018 FINAL  Final  MRSA PCR Screening     Status: Abnormal   Collection Time: 07/19/18  2:23 AM  Result Value Ref Range Status   MRSA by PCR POSITIVE (A) NEGATIVE Final    Comment:        The GeneXpert MRSA Assay (FDA approved for NASAL specimens only), is one component of a comprehensive MRSA colonization surveillance program. It is not intended to diagnose MRSA infection nor to guide or monitor treatment for MRSA infections. RESULT CALLED TO, READ BACK BY AND VERIFIED WITH: RN C  MASON [phone] MLM          Radiology Studies: No results found.      Scheduled Meds: . acetaminophen  1,000 mg Oral Once  .  Chlorhexidine Gluconate Cloth  6 each Topical Q0600  . enoxaparin (LOVENOX) injection  40 mg Subcutaneous Daily  . mupirocin ointment  1 application Nasal BID  . sulfamethoxazole-trimethoprim  1 tablet Oral Daily   Continuous Infusions: . sodium chloride 50 mL/hr at 07/19/18 1230  . clindamycin (CLEOCIN) IV 600 mg (07/21/18 1340)  . linezolid (ZYVOX) IV 600 mg (07/21/18 0933)     LOS: 2 days    Time spent: 30 minutes    Lynden OxfordPranav Patel, DO [facility] Pager [phone]  If 7PM-7AM, please contact night-coverage [email or web address] Password [email or web address] 07/21/2018, 4:48 PM

## 2018-07-21 NOTE — Progress Notes (Signed)
Left lower leg wound with mod amount of sero sang drainage, foam dressing is not holding all the drainage, dressing changed to 4x4  and kerlix dressing.

## 2018-07-22 LAB — COMPREHENSIVE METABOLIC PANEL
ALT: 17 U/L (ref 0–44)
AST: 27 U/L (ref 15–41)
Albumin: 2.8 g/dL — ABNORMAL LOW (ref 3.5–5.0)
Alkaline Phosphatase: 61 U/L (ref 38–126)
Anion gap: 8 (ref 5–15)
BILIRUBIN TOTAL: 0.4 mg/dL (ref 0.3–1.2)
BUN: 14 mg/dL (ref 6–20)
CO2: 26 mmol/L (ref 22–32)
Calcium: 8.5 mg/dL — ABNORMAL LOW (ref 8.9–10.3)
Chloride: 105 mmol/L (ref 98–111)
Creatinine, Ser: 0.75 mg/dL (ref 0.61–1.24)
GFR calc non Af Amer: >60 mL/min (ref >60)
Glucose, Bld: 129 mg/dL — ABNORMAL HIGH (ref 70–99)
Potassium: 3.9 mmol/L (ref 3.5–5.1)
Sodium: 139 mmol/L (ref 135–145)
Total Protein: 7.6 g/dL (ref 6.5–8.1)

## 2018-07-22 LAB — CBC WITH DIFFERENTIAL/PLATELET
Abs Immature Granulocytes: 0.02 10*3/uL (ref 0.00–0.07)
Basophils Absolute: 0 10*3/uL (ref 0.0–0.1)
Basophils Relative: 0 %
Eosinophils Absolute: 0.3 10*3/uL (ref 0.0–0.5)
Eosinophils Relative: 18 %
HEMATOCRIT: 38.1 % — AB (ref 39.0–52.0)
Hemoglobin: 12.8 g/dL — ABNORMAL LOW (ref 13.0–17.0)
Immature Granulocytes: 1 %
Lymphocytes Relative: 31 %
Lymphs Abs: 0.6 10*3/uL — ABNORMAL LOW (ref 0.7–4.0)
MCH: 32.2 pg (ref 26.0–34.0)
MCHC: 33.6 g/dL (ref 30.0–36.0)
MCV: 95.7 fL (ref 80.0–100.0)
Monocytes Absolute: 0.3 10*3/uL (ref 0.1–1.0)
Monocytes Relative: 14 %
Neutro Abs: 0.7 10*3/uL — ABNORMAL LOW (ref 1.7–7.7)
Neutrophils Relative %: 36 %
Platelets: 209 10*3/uL (ref 150–400)
RBC: 3.98 MIL/uL — ABNORMAL LOW (ref 4.22–5.81)
RDW: 13.8 % (ref 11.5–15.5)
WBC: 1.9 10*3/uL — ABNORMAL LOW (ref 4.0–10.5)
nRBC: 0 % (ref 0.0–0.2)

## 2018-07-22 NOTE — Progress Notes (Addendum)
PROGRESS NOTE    Jacob Gutierrez  BJY:[id number] DOB: 07-May-1986 DOA: 07/18/2018 PCP: Grayce Sessions, NP   Brief Narrative: Patient is a 32 year old male with past medical history of HIV, last CD4 count of 30 on 04/24/2018, noncompliant with antiretrovirals, recent history of scrotal abscess with MRSA who presented with complaints of fever, bilateral lower extremity cellulitis.  Started on empiric antimicrobial therapy.  ID following.  Currently on IV antibiotics.  Difficult discharge planning due to homelessness/social issues.  Assessment & Plan:   Principal Problem:   Sepsis (HCC) Active Problems:   AIDS (acquired immune deficiency syndrome) (HCC)   Homelessness   Personal history of MRSA (methicillin resistant Staphylococcus aureus)   History of ESBL E. coli infection   Cellulitis and abscess of right leg   Cellulitis and abscess of left leg   Acute lower UTI  Sepsis secondary to bilateral lower extremity cellulitis: Noted to have small abscesses on bilateral lower extremities that are draining without any fluctuation and induration .  Continue wound care.  Difficult discharge planning to home as he is homeless and he will have poor care for these wounds.  He had failed on outpatient antibiotic therapy. Currently on IV Zyvox.  ID following.  Wound culture showed MRSA.  Recent history of scrotal infection: Has history of recurrent skin infection with MRSA.  Currently scrotal area remains stable and not infected.  Was positive for MRSA.  He had finished a course of Zyvox for this.  HIV with noncompliance: ID arranging follow-up as an outpatient to start on retrovirals.  High risks of resistance due to noncompliance.  Continue prophylactic Bactrim.          DVT prophylaxis:Lovenox Code Status: Full Family Communication: None present at the bedside Disposition Plan: Home after finalization of antibiotic by ID.  Difficult discharge planning due to homelessness.  Already had  failed outpatient antibiotic therapy.   Consultants: ID  Procedures: None  Antimicrobials: Zyvox, Bactrim  IV Zyvox 12/26->  Bactrim 12/26->   Subjective:  Patient seen and examined the bedside this afternoon.  Remains hemodynamically stable.  Comfortable but but complains of pain on his wounds on bilateral lower extremities. Objective: Vitals:   07/21/18 0536 07/21/18 1316 07/21/18 1928 07/22/18 0556  BP: 117/74 133/83 109/72 112/74  Pulse: 84 88 89 69  Resp:  18    Temp: 98.7 F (37.1 C) 98.8 F (37.1 C) 98.4 F (36.9 C) 98.1 F (36.7 C)  TempSrc: Oral Oral Oral Oral  SpO2: 99% 100% 99% 98%  Weight:      Height:        Intake/Output Summary (Last 24 hours) at 07/22/2018 1421 Last data filed at 07/22/2018 1300 Gross per 24 hour  Intake 2709.2 ml  Output 1800 ml  Net 909.2 ml   Filed Weights   07/18/18 2005 07/18/18 2011  Weight: 86.2 kg 86.2 kg    Examination:  General exam: Appears calm and comfortable ,Not in distress,average built HEENT:PERRL,Oral mucosa moist, Ear/Nose normal on gross exam Respiratory system: Bilateral equal air entry, normal vesicular breath sounds, no wheezes or crackles  Cardiovascular system: S1 & S2 heard, RRR. No JVD, murmurs, rubs, gallops or clicks. No pedal edema. Gastrointestinal system: Abdomen is nondistended, soft and nontender. No organomegaly or masses felt. Normal bowel sounds heard. Central nervous system: Alert and oriented. No focal neurological deficits. Extremities: No edema, no clubbing ,no cyanosis, distal peripheral pulses palpable. Has draining wound on bilateral lower extremities on proximal tibial area. Wound examined  at the bedside.  Draining well.  No fluctuation, endurance.  No pus or foul order noticed. Skin: No rashes, lesions or ulcers,no icterus ,no pallor MSK: Normal muscle bulk,tone ,power Psychiatry: Judgement and insight appear normal. Mood & affect appropriate.     Data Reviewed: I have  personally reviewed following labs and imaging studies  CBC: Recent Labs  Lab 07/15/18 1548 07/18/18 2237 07/19/18 0222 07/21/18 0409 07/22/18 0618  WBC 4.3 6.4 5.6 2.8* 1.9*  NEUTROABS 2.6 4.6  --   --  0.7*  HGB 12.0* 13.3 11.4* 11.4* 12.8*  HCT 36.1* 39.9 33.3* 34.6* 38.1*  MCV 94.5 95.7 92.5 93.8 95.7  PLT 253 235 224 197 209   Basic Metabolic Panel: Recent Labs  Lab 07/18/18 2237 07/19/18 0222 07/20/18 0138 07/21/18 0409 07/22/18 0618  NA 138 135 136 133* 139  K 4.3 3.7 4.2 4.3 3.9  CL 104 104 102 100 105  CO2 23 22 25  21* 26  GLUCOSE 94 106* 118* 104* 129*  BUN 19 16 12 12 14   CREATININE 0.79 0.65 0.68 0.60* 0.75  CALCIUM 8.7* 8.0* 8.4* 8.3* 8.5*   GFR: Estimated Creatinine Clearance: 147 mL/min (by C-G formula based on SCr of 0.75 mg/dL). Liver Function Tests: Recent Labs  Lab 07/18/18 2237 07/22/18 0618  AST 28 27  ALT 15 17  ALKPHOS 66 61  BILITOT 0.7 0.4  PROT 8.5* 7.6  ALBUMIN 3.3* 2.8*   No results for input(s): LIPASE, AMYLASE in the last 168 hours. No results for input(s): AMMONIA in the last 168 hours. Coagulation Profile: No results for input(s): INR, PROTIME in the last 168 hours. Cardiac Enzymes: No results for input(s): CKTOTAL, CKMB, CKMBINDEX, TROPONINI in the last 168 hours. BNP (last 3 results) No results for input(s): PROBNP in the last 8760 hours. HbA1C: No results for input(s): HGBA1C in the last 72 hours. CBG: No results for input(s): GLUCAP in the last 168 hours. Lipid Profile: No results for input(s): CHOL, HDL, LDLCALC, TRIG, CHOLHDL, LDLDIRECT in the last 72 hours. Thyroid Function Tests: No results for input(s): TSH, T4TOTAL, FREET4, T3FREE, THYROIDAB in the last 72 hours. Anemia Panel: No results for input(s): VITAMINB12, FOLATE, FERRITIN, TIBC, IRON, RETICCTPCT in the last 72 hours. Sepsis Labs: Recent Labs  Lab 07/18/18 2358  LATICACIDVEN 1.66    Recent Results (from the past 240 hour(s))  Aerobic Culture  (superficial specimen)     Status: None   Collection Time: 07/18/18 10:09 PM  Result Value Ref Range Status   Specimen Description WOUND LEFT LEG  Final   Special Requests Immunocompromised  Final   Gram Stain   Final    MODERATE WBC PRESENT, PREDOMINANTLY PMN ABUNDANT GRAM POSITIVE COCCI Performed at [facility], [address] [place], [place] [address]    Culture   Final    ABUNDANT METHICILLIN RESISTANT STAPHYLOCOCCUS AUREUS MODERATE STREPTOCOCCUS GROUP G    Report Status 07/21/2018 FINAL  Final   Organism ID, Bacteria METHICILLIN RESISTANT STAPHYLOCOCCUS AUREUS  Final      Susceptibility   Methicillin resistant staphylococcus aureus - MIC*    CIPROFLOXACIN >=8 RESISTANT Resistant     ERYTHROMYCIN >=8 RESISTANT Resistant     GENTAMICIN <=0.5 SENSITIVE Sensitive     OXACILLIN >=4 RESISTANT Resistant     TETRACYCLINE >=16 RESISTANT Resistant     VANCOMYCIN <=0.5 SENSITIVE Sensitive     TRIMETH/SULFA 20 SENSITIVE Sensitive     CLINDAMYCIN <=0.25 SENSITIVE Sensitive     RIFAMPIN <=0.5  SENSITIVE Sensitive     Inducible Clindamycin NEGATIVE Sensitive     * ABUNDANT METHICILLIN RESISTANT STAPHYLOCOCCUS AUREUS  Aerobic Culture (superficial specimen)     Status: None   Collection Time: 07/18/18 10:09 PM  Result Value Ref Range Status   Specimen Description WOUND RIGHT LEG  Final   Special Requests Immunocompromised  Final   Gram Stain   Final    MODERATE WBC PRESENT, PREDOMINANTLY PMN MODERATE GRAM POSITIVE COCCI FEW GRAM POSITIVE RODS Performed at [facility], [address] [place], [place] [address]    Culture   Final    MODERATE METHICILLIN RESISTANT STAPHYLOCOCCUS AUREUS FEW STREPTOCOCCUS GROUP G    Report Status 07/21/2018 FINAL  Final   Organism ID, Bacteria METHICILLIN RESISTANT STAPHYLOCOCCUS AUREUS  Final      Susceptibility   Methicillin resistant staphylococcus aureus - MIC*    CIPROFLOXACIN >=8 RESISTANT Resistant     ERYTHROMYCIN >=8  RESISTANT Resistant     GENTAMICIN <=0.5 SENSITIVE Sensitive     OXACILLIN >=4 RESISTANT Resistant     TETRACYCLINE >=16 RESISTANT Resistant     VANCOMYCIN <=0.5 SENSITIVE Sensitive     TRIMETH/SULFA 20 SENSITIVE Sensitive     CLINDAMYCIN <=0.25 SENSITIVE Sensitive     RIFAMPIN <=0.5 SENSITIVE Sensitive     Inducible Clindamycin NEGATIVE Sensitive     * MODERATE METHICILLIN RESISTANT STAPHYLOCOCCUS AUREUS  Culture, blood (routine x 2)     Status: None (Preliminary result)   Collection Time: 07/18/18 11:45 PM  Result Value Ref Range Status   Specimen Description BLOOD RIGHT WRIST  Final   Special Requests   Final    BOTTLES DRAWN AEROBIC AND ANAEROBIC Blood Culture adequate volume   Culture   Final    NO GROWTH 3 DAYS Performed at [facility], [address] [place], [place] [address]    Report Status PENDING  Incomplete  Culture, blood (routine x 2)     Status: None (Preliminary result)   Collection Time: 07/19/18 12:01 AM  Result Value Ref Range Status   Specimen Description BLOOD LEFT WRIST  Final   Special Requests   Final    BOTTLES DRAWN AEROBIC AND ANAEROBIC Blood Culture adequate volume   Culture   Final    NO GROWTH 3 DAYS Performed at [facility], [address] [place], [place] [address]    Report Status PENDING  Incomplete  Culture, Urine     Status: Abnormal   Collection Time: 07/19/18 12:18 AM  Result Value Ref Range Status   Specimen Description URINE, RANDOM  Final   Special Requests NONE  Final   Culture (A)  Final    <10,000 COLONIES/mL INSIGNIFICANT GROWTH Performed at [facility], [address] [place], [place] [address]    Report Status 07/20/2018 FINAL  Final  MRSA PCR Screening     Status: Abnormal   Collection Time: 07/19/18  2:23 AM  Result Value Ref Range Status   MRSA by PCR POSITIVE (A) NEGATIVE Final    Comment:        The GeneXpert MRSA Assay (FDA approved for NASAL specimens only), is one component of  a comprehensive MRSA colonization surveillance program. It is not intended to diagnose MRSA infection nor to guide or monitor treatment for MRSA infections. RESULT CALLED TO, READ BACK BY AND VERIFIED WITH: RN C MASON [phone]          Radiology Studies: No results found.  Scheduled Meds: . acetaminophen  1,000 mg Oral Once  . Chlorhexidine Gluconate Cloth  6 each Topical Q0600  . enoxaparin (LOVENOX) injection  40 mg Subcutaneous Daily  . mupirocin ointment  1 application Nasal BID  . sulfamethoxazole-trimethoprim  1 tablet Oral Daily   Continuous Infusions: . sodium chloride 50 mL/hr at 07/19/18 1230  . linezolid (ZYVOX) IV 600 mg (07/22/18 0914)     LOS: 3 days    Time spent: 35 mins.More than 50% of that time was spent in counseling and/or coordination of care.      Burnadette PopAmrit Zaelyn Noack, MD [facility] Pager [phone]  If 7PM-7AM, please contact night-coverage [email or web address] Password [email or web address] 07/22/2018, 2:21 PM

## 2018-07-23 LAB — CBC WITH DIFFERENTIAL/PLATELET
Basophils Absolute: 0 10*3/uL (ref 0.0–0.1)
Basophils Relative: 1 %
Blasts: 0 %
Eosinophils Absolute: 0.4 10*3/uL (ref 0.0–0.5)
Eosinophils Relative: 18 %
HCT: 36.5 % — ABNORMAL LOW (ref 39.0–52.0)
Hemoglobin: 12.8 g/dL — ABNORMAL LOW (ref 13.0–17.0)
Lymphocytes Relative: 23 %
Lymphs Abs: 0.5 10*3/uL — ABNORMAL LOW (ref 0.7–4.0)
MCH: 34.3 pg — AB (ref 26.0–34.0)
MCHC: 35.1 g/dL (ref 30.0–36.0)
MCV: 97.9 fL (ref 80.0–100.0)
Metamyelocytes Relative: 3 %
Monocytes Absolute: 0.1 10*3/uL (ref 0.1–1.0)
Monocytes Relative: 3 %
NEUTROS ABS: 1 10*3/uL — AB (ref 1.7–7.7)
Neutrophils Relative %: 46 %
Other: 5 %
Platelets: 250 10*3/uL (ref 150–400)
Promyelocytes Relative: 1 %
RBC: 3.73 MIL/uL — AB (ref 4.22–5.81)
RDW: 14 % (ref 11.5–15.5)
WBC: 2.2 10*3/uL — AB (ref 4.0–10.5)
nRBC: 0 % (ref 0.0–0.2)
nRBC: 0 /100 WBC

## 2018-07-23 LAB — PATHOLOGIST SMEAR REVIEW

## 2018-07-23 MED ORDER — AZITHROMYCIN 600 MG PO TABS
1200.0000 mg | ORAL_TABLET | ORAL | Status: DC
  Administered 2018-07-23: 1200 mg via ORAL
  Filled 2018-07-23: qty 2

## 2018-07-23 MED ORDER — LINEZOLID 600 MG PO TABS
600.0000 mg | ORAL_TABLET | Freq: Two times a day (BID) | ORAL | Status: DC
  Administered 2018-07-23 – 2018-07-24 (×2): 600 mg via ORAL
  Filled 2018-07-23 (×3): qty 1

## 2018-07-23 MED ORDER — BICTEGRAVIR-EMTRICITAB-TENOFOV 50-200-25 MG PO TABS: 1.0000 | ORAL_TABLET | Freq: Every day | ORAL | 2 refills | Status: DC

## 2018-07-23 MED ORDER — BICTEGRAVIR-EMTRICITAB-TENOFOV 50-200-25 MG PO TABS
1.0000 | ORAL_TABLET | Freq: Every day | ORAL | Status: DC
  Administered 2018-07-23 – 2018-07-24 (×2): 1 via ORAL
  Filled 2018-07-23 (×2): qty 1

## 2018-07-23 MED ORDER — AZITHROMYCIN 600 MG PO TABS: 1200.0000 mg | ORAL_TABLET | ORAL | 2 refills | Status: DC

## 2018-07-23 MED FILL — AZITHROMYCIN 600 MG TABLET: 600 | 28 days supply | Qty: 8 | Fill #0

## 2018-07-23 NOTE — Plan of Care (Signed)

## 2018-07-23 NOTE — Progress Notes (Signed)
INFECTIOUS DISEASE PROGRESS NOTE  ID: Jacob Gutierrez is a 32 y.o. male with  Principal Problem:   Sepsis (HCC) Active Problems:   AIDS (acquired immune deficiency syndrome) (HCC)   Homelessness   Personal history of MRSA (methicillin resistant Staphylococcus aureus)   History of ESBL E. coli infection   Cellulitis and abscess of right leg   Cellulitis and abscess of left leg   Acute lower UTI  Subjective: No complaints.  Abtx:  Anti-infectives (From admission, onward)   Start     Dose/Rate Route Frequency Ordered Stop   07/20/18 1545  clindamycin (CLEOCIN) IVPB 600 mg     600 mg 100 mL/hr over 30 Minutes Intravenous Every 8 hours 07/20/18 1510 07/22/18 0618   07/20/18 0000  sulfamethoxazole-trimethoprim (BACTRIM) 400-80 MG tablet  Status:  Discontinued     1 tablet Oral Daily 07/20/18 1233 07/20/18    07/20/18 0000  sulfamethoxazole-trimethoprim (BACTRIM) 400-80 MG tablet     1 tablet Oral Daily 07/20/18 1233     07/19/18 1130  sulfamethoxazole-trimethoprim (BACTRIM,SEPTRA) 400-80 MG per tablet 1 tablet     1 tablet Oral Daily 07/19/18 1125     07/19/18 1000  linezolid (ZYVOX) IVPB 600 mg     600 mg 300 mL/hr over 60 Minutes Intravenous Every 12 hours 07/19/18 0900     07/19/18 0030  ceFEPIme (MAXIPIME) 2 g in sodium chloride 0.9 % 100 mL IVPB  Status:  Discontinued     2 g 200 mL/hr over 30 Minutes Intravenous Every 12 hours 07/19/18 0002 07/19/18 0004   07/19/18 0015  meropenem (MERREM) 1 g in sodium chloride 0.9 % 100 mL IVPB  Status:  Discontinued     1 g 200 mL/hr over 30 Minutes Intravenous Every 8 hours 07/19/18 0010 07/19/18 1125   07/19/18 0000  linezolid (ZYVOX) 600 MG tablet     600 mg Oral 2 times daily 07/19/18 1600     07/18/18 2359  linezolid (ZYVOX) IVPB 600 mg     600 mg 300 mL/hr over 60 Minutes Intravenous  Once 07/18/18 2337 07/19/18 0346      Medications:  Scheduled: . acetaminophen  1,000 mg Oral Once  . Chlorhexidine Gluconate Cloth  6  each Topical Q0600  . enoxaparin (LOVENOX) injection  40 mg Subcutaneous Daily  . mupirocin ointment  1 application Nasal BID  . sulfamethoxazole-trimethoprim  1 tablet Oral Daily    Objective: Vital signs in last 24 hours: Temp:  [97.9 F (36.6 C)-98.4 F (36.9 C)] 98.4 F (36.9 C) (12/30 0553) Pulse Rate:  [79-87] 79 (12/30 0553) Resp:  [18] 18 (12/29 1458) BP: (108-117)/(72-75) 117/73 (12/30 0553) SpO2:  [100 %] 100 % (12/30 0553)   General appearance: alert, cooperative and no distress Resp: clear to auscultation bilaterally Cardio: regular rate and rhythm GI: normal findings: bowel sounds normal and soft, non-tender Extremities: BLE wounds dressed.   Lab Results Recent Labs    07/21/18 0409 07/22/18 0618 07/23/18 0803  WBC 2.8* 1.9* 2.2*  HGB 11.4* 12.8* 12.8*  HCT 34.6* 38.1* 36.5*  NA 133* 139  --   K 4.3 3.9  --   CL 100 105  --   CO2 21* 26  --   BUN 12 14  --   CREATININE 0.60* 0.75  --    Liver Panel Recent Labs    07/22/18 0618  PROT 7.6  ALBUMIN 2.8*  AST 27  ALT 17  ALKPHOS 61  BILITOT 0.4  Sedimentation Rate No results for input(s): ESRSEDRATE in the last 72 hours. C-Reactive Protein No results for input(s): CRP in the last 72 hours.  Microbiology: Recent Results (from the past 240 hour(s))  Aerobic Culture (superficial specimen)     Status: None   Collection Time: 07/18/18 10:09 PM  Result Value Ref Range Status   Specimen Description WOUND LEFT LEG  Final   Special Requests Immunocompromised  Final   Gram Stain   Final    MODERATE WBC PRESENT, PREDOMINANTLY PMN ABUNDANT GRAM POSITIVE COCCI Performed at [facility], [address], [place], [place] [address]    Culture   Final    ABUNDANT METHICILLIN RESISTANT STAPHYLOCOCCUS AUREUS MODERATE STREPTOCOCCUS GROUP G    Report Status 07/21/2018 FINAL  Final   Organism ID, Bacteria METHICILLIN RESISTANT STAPHYLOCOCCUS AUREUS  Final      Susceptibility   Methicillin  resistant staphylococcus aureus - MIC*    CIPROFLOXACIN >=8 RESISTANT Resistant     ERYTHROMYCIN >=8 RESISTANT Resistant     GENTAMICIN <=0.5 SENSITIVE Sensitive     OXACILLIN >=4 RESISTANT Resistant     TETRACYCLINE >=16 RESISTANT Resistant     VANCOMYCIN <=0.5 SENSITIVE Sensitive     TRIMETH/SULFA 20 SENSITIVE Sensitive     CLINDAMYCIN <=0.25 SENSITIVE Sensitive     RIFAMPIN <=0.5 SENSITIVE Sensitive     Inducible Clindamycin NEGATIVE Sensitive     * ABUNDANT METHICILLIN RESISTANT STAPHYLOCOCCUS AUREUS  Aerobic Culture (superficial specimen)     Status: None   Collection Time: 07/18/18 10:09 PM  Result Value Ref Range Status   Specimen Description WOUND RIGHT LEG  Final   Special Requests Immunocompromised  Final   Gram Stain   Final    MODERATE WBC PRESENT, PREDOMINANTLY PMN MODERATE GRAM POSITIVE COCCI FEW GRAM POSITIVE RODS Performed at [facility], [address] [place], [place] [address]    Culture   Final    MODERATE METHICILLIN RESISTANT STAPHYLOCOCCUS AUREUS FEW STREPTOCOCCUS GROUP G    Report Status 07/21/2018 FINAL  Final   Organism ID, Bacteria METHICILLIN RESISTANT STAPHYLOCOCCUS AUREUS  Final      Susceptibility   Methicillin resistant staphylococcus aureus - MIC*    CIPROFLOXACIN >=8 RESISTANT Resistant     ERYTHROMYCIN >=8 RESISTANT Resistant     GENTAMICIN <=0.5 SENSITIVE Sensitive     OXACILLIN >=4 RESISTANT Resistant     TETRACYCLINE >=16 RESISTANT Resistant     VANCOMYCIN <=0.5 SENSITIVE Sensitive     TRIMETH/SULFA 20 SENSITIVE Sensitive     CLINDAMYCIN <=0.25 SENSITIVE Sensitive     RIFAMPIN <=0.5 SENSITIVE Sensitive     Inducible Clindamycin NEGATIVE Sensitive     * MODERATE METHICILLIN RESISTANT STAPHYLOCOCCUS AUREUS  Culture, blood (routine x 2)     Status: None (Preliminary result)   Collection Time: 07/18/18 11:45 PM  Result Value Ref Range Status   Specimen Description BLOOD RIGHT WRIST  Final   Special Requests   Final    BOTTLES  DRAWN AEROBIC AND ANAEROBIC Blood Culture adequate volume   Culture   Final    NO GROWTH 3 DAYS Performed at [facility], [address], [place], [place] [address]    Report Status PENDING  Incomplete  Culture, blood (routine x 2)     Status: None (Preliminary result)   Collection Time: 07/19/18 12:01 AM  Result Value Ref Range Status   Specimen Description BLOOD LEFT WRIST  Final   Special Requests   Final  BOTTLES DRAWN AEROBIC AND ANAEROBIC Blood Culture adequate volume   Culture   Final    NO GROWTH 3 DAYS Performed at [facility], [address] [place], [place] [address]    Report Status PENDING  Incomplete  Culture, Urine     Status: Abnormal   Collection Time: 07/19/18 12:18 AM  Result Value Ref Range Status   Specimen Description URINE, RANDOM  Final   Special Requests NONE  Final   Culture (A)  Final    <10,000 COLONIES/mL INSIGNIFICANT GROWTH Performed at [facility], [address] [place], [place] [address]    Report Status 07/20/2018 FINAL  Final  MRSA PCR Screening     Status: Abnormal   Collection Time: 07/19/18  2:23 AM  Result Value Ref Range Status   MRSA by PCR POSITIVE (A) NEGATIVE Final    Comment:        The GeneXpert MRSA Assay (FDA approved for NASAL specimens only), is one component of a comprehensive MRSA colonization surveillance program. It is not intended to diagnose MRSA infection nor to guide or monitor treatment for MRSA infections. RESULT CALLED TO, READ BACK BY AND VERIFIED WITH: RN C MASON [phone] MLM     Studies/Results: No results found.  HIV 1 RNA Quant (copies/mL)  Date Value  07/20/2018 1,220,000  06/08/2018 95,100  04/24/2018 377,000 (H)   CD4 T Cell Abs (/uL)  Date Value  07/20/2018 10 (L)  04/24/2018 30 (L)  02/02/2018 60 (L)     Assessment/Plan: AIDS  Off art prior to admission Staph folliculitis  Prev MRSA (R- bactrim, doxy)  Total days of antibiotics: 4  linezolid    Bactrim, azithro, biktarvy  Getting zyvox through transitions of care pharmacy Home with ART and OI prophylaxis.  F/u in ID clinic asap.           Johny SaxJeffrey Karder Goodin MD, FACP Infectious Diseases (pager) [phone] [email or web address] 07/23/2018, 12:16 PM  LOS: 4 days

## 2018-07-23 NOTE — Progress Notes (Signed)
PROGRESS NOTE    Jacob Gutierrez  WUJ:[id number] DOB: 1985-12-25 DOA: 07/18/2018 PCP: Grayce SessionsEdwards, Michelle P, NP   Brief Narrative: Patient is a 32 year old male with past medical history of HIV, last CD4 count of 30 on 04/24/2018, noncompliant with antiretrovirals, recent history of scrotal abscess with MRSA who presented with complaints of fever, bilateral lower extremity cellulitis.  Started on empiric antimicrobial therapy.  ID following.  Currently on IV antibiotics.  Difficult discharge planning due to homelessness/social issues.  Assessment & Plan:   Principal Problem:   Sepsis (HCC) Active Problems:   AIDS (acquired immune deficiency syndrome) (HCC)   Homelessness   Personal history of MRSA (methicillin resistant Staphylococcus aureus)   History of ESBL E. coli infection   Cellulitis and abscess of right leg   Cellulitis and abscess of left leg   Acute lower UTI  Sepsis secondary to bilateral lower extremity cellulitis: Noted to have small abscesses on bilateral lower extremities that are draining without any fluctuation and induration .  Continue wound care.  Difficult discharge planning to home as he is homeless and he will have poor care for these wounds.  He had failed on outpatient antibiotic therapy. Currently on IV Zyvox.  ID following.  Superficial wound culture showed MRSA.  Recent history of scrotal infection: Has history of recurrent skin infection with MRSA.  Currently scrotal area remains stable and not infected.  Was positive for MRSA.  He had finished a course of Zyvox for this.  HIV with noncompliance: ID arranging follow-up as an outpatient to start on retrovirals.  High risks of resistance due to noncompliance.  Continue prophylactic Bactrim.  Neutropenia. The setting of sepsis. There was some concern that this could be secondary to Zyvox. Now the WBC has stabilized.  Will monitor.  DVT prophylaxis:Lovenox Code Status: Full Family Communication: None  present at the bedside Disposition Plan: Home after finalization of antibiotic by ID.  Difficult discharge planning due to homelessness.  Already had failed outpatient antibiotic therapy.   Consultants: ID  Procedures: None  Antimicrobials: Zyvox, Bactrim  IV Zyvox 12/26->  Bactrim 12/26->   Subjective: No acute complaint no nausea no vomiting no fever no chills.  No chest pain no diarrhea.  Objective: Vitals:   07/22/18 0556 07/22/18 1458 07/22/18 1926 07/23/18 0553  BP: 112/74 108/72 114/75 117/73  Pulse: 69 79 87 79  Resp:  18    Temp: 98.1 F (36.7 C) 98.4 F (36.9 C) 97.9 F (36.6 C) 98.4 F (36.9 C)  TempSrc: Oral Oral Oral Oral  SpO2: 98% 100% 100% 100%  Weight:      Height:        Intake/Output Summary (Last 24 hours) at 07/23/2018 1755 Last data filed at 07/23/2018 1102 Gross per 24 hour  Intake -  Output 825 ml  Net -825 ml   Filed Weights   07/18/18 2005 07/18/18 2011  Weight: 86.2 kg 86.2 kg    Examination:  General exam: Appears calm and comfortable ,Not in distress,average built HEENT:PERRL,Oral mucosa moist, Ear/Nose normal on gross exam Respiratory system: Bilateral equal air entry, normal vesicular breath sounds, no wheezes or crackles  Cardiovascular system: S1 & S2 heard, RRR. No JVD, murmurs, rubs, gallops or clicks. No pedal edema. Gastrointestinal system: Abdomen is nondistended, soft and nontender. No organomegaly or masses felt. Normal bowel sounds heard. Central nervous system: Alert and oriented. No focal neurological deficits. Extremities: No edema, no clubbing ,no cyanosis, distal peripheral pulses palpable. Has draining wound on bilateral  lower extremities on proximal tibial area. Wound examined at the bedside.  Draining well.  No fluctuation, endurance.  No pus or foul order noticed. Skin: No rashes, lesions or ulcers,no icterus ,no pallor MSK: Normal muscle bulk,tone ,power Psychiatry: Judgement and insight appear normal. Mood  & affect appropriate.     Data Reviewed: I have personally reviewed following labs and imaging studies  CBC: Recent Labs  Lab 07/18/18 2237 07/19/18 0222 07/21/18 0409 07/22/18 0618 07/23/18 0803  WBC 6.4 5.6 2.8* 1.9* 2.2*  NEUTROABS 4.6  --   --  0.7* 1.0*  HGB 13.3 11.4* 11.4* 12.8* 12.8*  HCT 39.9 33.3* 34.6* 38.1* 36.5*  MCV 95.7 92.5 93.8 95.7 97.9  PLT 235 224 197 209 250   Basic Metabolic Panel: Recent Labs  Lab 07/18/18 2237 07/19/18 0222 07/20/18 0138 07/21/18 0409 07/22/18 0618  NA 138 135 136 133* 139  K 4.3 3.7 4.2 4.3 3.9  CL 104 104 102 100 105  CO2 23 22 25  21* 26  GLUCOSE 94 106* 118* 104* 129*  BUN 19 16 12 12 14   CREATININE 0.79 0.65 0.68 0.60* 0.75  CALCIUM 8.7* 8.0* 8.4* 8.3* 8.5*   GFR: Estimated Creatinine Clearance: 147 mL/min (by C-G formula based on SCr of 0.75 mg/dL). Liver Function Tests: Recent Labs  Lab 07/18/18 2237 07/22/18 0618  AST 28 27  ALT 15 17  ALKPHOS 66 61  BILITOT 0.7 0.4  PROT 8.5* 7.6  ALBUMIN 3.3* 2.8*   No results for input(s): LIPASE, AMYLASE in the last 168 hours. No results for input(s): AMMONIA in the last 168 hours. Coagulation Profile: No results for input(s): INR, PROTIME in the last 168 hours. Cardiac Enzymes: No results for input(s): CKTOTAL, CKMB, CKMBINDEX, TROPONINI in the last 168 hours. BNP (last 3 results) No results for input(s): PROBNP in the last 8760 hours. HbA1C: No results for input(s): HGBA1C in the last 72 hours. CBG: No results for input(s): GLUCAP in the last 168 hours. Lipid Profile: No results for input(s): CHOL, HDL, LDLCALC, TRIG, CHOLHDL, LDLDIRECT in the last 72 hours. Thyroid Function Tests: No results for input(s): TSH, T4TOTAL, FREET4, T3FREE, THYROIDAB in the last 72 hours. Anemia Panel: No results for input(s): VITAMINB12, FOLATE, FERRITIN, TIBC, IRON, RETICCTPCT in the last 72 hours. Sepsis Labs: Recent Labs  Lab 07/18/18 2358  LATICACIDVEN 1.66    Recent  Results (from the past 240 hour(s))  Aerobic Culture (superficial specimen)     Status: None   Collection Time: 07/18/18 10:09 PM  Result Value Ref Range Status   Specimen Description WOUND LEFT LEG  Final   Special Requests Immunocompromised  Final   Gram Stain   Final    MODERATE WBC PRESENT, PREDOMINANTLY PMN ABUNDANT GRAM POSITIVE COCCI Performed at [facility], [address], [place], [place] [address]    Culture   Final    ABUNDANT METHICILLIN RESISTANT STAPHYLOCOCCUS AUREUS MODERATE STREPTOCOCCUS GROUP G    Report Status 07/21/2018 FINAL  Final   Organism ID, Bacteria METHICILLIN RESISTANT STAPHYLOCOCCUS AUREUS  Final      Susceptibility   Methicillin resistant staphylococcus aureus - MIC*    CIPROFLOXACIN >=8 RESISTANT Resistant     ERYTHROMYCIN >=8 RESISTANT Resistant     GENTAMICIN <=0.5 SENSITIVE Sensitive     OXACILLIN >=4 RESISTANT Resistant     TETRACYCLINE >=16 RESISTANT Resistant     VANCOMYCIN <=0.5 SENSITIVE Sensitive     TRIMETH/SULFA 20 SENSITIVE Sensitive     CLINDAMYCIN <=0.25  SENSITIVE Sensitive     RIFAMPIN <=0.5 SENSITIVE Sensitive     Inducible Clindamycin NEGATIVE Sensitive     * ABUNDANT METHICILLIN RESISTANT STAPHYLOCOCCUS AUREUS  Aerobic Culture (superficial specimen)     Status: None   Collection Time: 07/18/18 10:09 PM  Result Value Ref Range Status   Specimen Description WOUND RIGHT LEG  Final   Special Requests Immunocompromised  Final   Gram Stain   Final    MODERATE WBC PRESENT, PREDOMINANTLY PMN MODERATE GRAM POSITIVE COCCI FEW GRAM POSITIVE RODS Performed at [facility], [address] [place], [place] [address]    Culture   Final    MODERATE METHICILLIN RESISTANT STAPHYLOCOCCUS AUREUS FEW STREPTOCOCCUS GROUP G    Report Status 07/21/2018 FINAL  Final   Organism ID, Bacteria METHICILLIN RESISTANT STAPHYLOCOCCUS AUREUS  Final      Susceptibility   Methicillin resistant staphylococcus aureus - MIC*    CIPROFLOXACIN  >=8 RESISTANT Resistant     ERYTHROMYCIN >=8 RESISTANT Resistant     GENTAMICIN <=0.5 SENSITIVE Sensitive     OXACILLIN >=4 RESISTANT Resistant     TETRACYCLINE >=16 RESISTANT Resistant     VANCOMYCIN <=0.5 SENSITIVE Sensitive     TRIMETH/SULFA 20 SENSITIVE Sensitive     CLINDAMYCIN <=0.25 SENSITIVE Sensitive     RIFAMPIN <=0.5 SENSITIVE Sensitive     Inducible Clindamycin NEGATIVE Sensitive     * MODERATE METHICILLIN RESISTANT STAPHYLOCOCCUS AUREUS  Culture, blood (routine x 2)     Status: None (Preliminary result)   Collection Time: 07/18/18 11:45 PM  Result Value Ref Range Status   Specimen Description BLOOD RIGHT WRIST  Final   Special Requests   Final    BOTTLES DRAWN AEROBIC AND ANAEROBIC Blood Culture adequate volume   Culture   Final    NO GROWTH 4 DAYS Performed at [facility], [address] [place], [place] [address]    Report Status PENDING  Incomplete  Culture, blood (routine x 2)     Status: None (Preliminary result)   Collection Time: 07/19/18 12:01 AM  Result Value Ref Range Status   Specimen Description BLOOD LEFT WRIST  Final   Special Requests   Final    BOTTLES DRAWN AEROBIC AND ANAEROBIC Blood Culture adequate volume   Culture   Final    NO GROWTH 4 DAYS Performed at [facility], [address] [place], [place] [address]    Report Status PENDING  Incomplete  Culture, Urine     Status: Abnormal   Collection Time: 07/19/18 12:18 AM  Result Value Ref Range Status   Specimen Description URINE, RANDOM  Final   Special Requests NONE  Final   Culture (A)  Final    <10,000 COLONIES/mL INSIGNIFICANT GROWTH Performed at [facility], [address] [place], [place] [address]    Report Status 07/20/2018 FINAL  Final  MRSA PCR Screening     Status: Abnormal   Collection Time: 07/19/18  2:23 AM  Result Value Ref Range Status   MRSA by PCR POSITIVE (A) NEGATIVE Final    Comment:        The GeneXpert MRSA Assay (FDA approved for NASAL  specimens only), is one component of a comprehensive MRSA colonization surveillance program. It is not intended to diagnose MRSA infection nor to guide or monitor treatment for MRSA infections. RESULT CALLED TO, READ BACK BY AND VERIFIED WITH: RN C MASON [phone] MLM  Radiology Studies: No results found.      Scheduled Meds: . acetaminophen  1,000 mg Oral Once  . azithromycin  1,200 mg Oral Weekly  . bictegravir-emtricitabine-tenofovir AF  1 tablet Oral Daily  . Chlorhexidine Gluconate Cloth  6 each Topical Q0600  . enoxaparin (LOVENOX) injection  40 mg Subcutaneous Daily  . linezolid  600 mg Oral Q12H  . mupirocin ointment  1 application Nasal BID  . sulfamethoxazole-trimethoprim  1 tablet Oral Daily   Continuous Infusions: . sodium chloride 50 mL/hr at 07/19/18 1230     LOS: 4 days    Time spent: 35 mins.More than 50% of that time was spent in counseling and/or coordination of care.   Author:  Lynden Oxford, MD [facility] Pager: [phone] 07/23/2018 5:57 PM     If 7PM-7AM, please contact night-coverage [email or web address] Password [email or web address] 07/23/2018, 5:55 PM

## 2018-07-24 ENCOUNTER — Inpatient Hospital Stay: Payer: Medicaid Other | Admitting: Infectious Diseases

## 2018-07-24 LAB — CBC WITH DIFFERENTIAL/PLATELET
ABS IMMATURE GRANULOCYTES: 0.05 10*3/uL (ref 0.00–0.07)
Basophils Absolute: 0 10*3/uL (ref 0.0–0.1)
Basophils Relative: 1 %
Eosinophils Absolute: 0.4 10*3/uL (ref 0.0–0.5)
Eosinophils Relative: 16 %
HCT: 36.6 % — ABNORMAL LOW (ref 39.0–52.0)
Hemoglobin: 11.8 g/dL — ABNORMAL LOW (ref 13.0–17.0)
Immature Granulocytes: 2 %
Lymphocytes Relative: 14 %
Lymphs Abs: 0.4 10*3/uL — ABNORMAL LOW (ref 0.7–4.0)
MCH: 30 pg (ref 26.0–34.0)
MCHC: 32.2 g/dL (ref 30.0–36.0)
MCV: 93.1 fL (ref 80.0–100.0)
MONO ABS: 0.4 10*3/uL (ref 0.1–1.0)
Monocytes Relative: 16 %
NEUTROS ABS: 1.4 10*3/uL — AB (ref 1.7–7.7)
Neutrophils Relative %: 51 %
PLATELETS: 211 10*3/uL (ref 150–400)
RBC: 3.93 MIL/uL — ABNORMAL LOW (ref 4.22–5.81)
RDW: 13.6 % (ref 11.5–15.5)
WBC: 2.7 10*3/uL — ABNORMAL LOW (ref 4.0–10.5)
nRBC: 0 % (ref 0.0–0.2)

## 2018-07-24 LAB — COMPREHENSIVE METABOLIC PANEL
ALT: 58 U/L — ABNORMAL HIGH (ref 0–44)
AST: 82 U/L — ABNORMAL HIGH (ref 15–41)
Albumin: 2.9 g/dL — ABNORMAL LOW (ref 3.5–5.0)
Alkaline Phosphatase: 64 U/L (ref 38–126)
Anion gap: 9 (ref 5–15)
BUN: 22 mg/dL — ABNORMAL HIGH (ref 6–20)
CO2: 23 mmol/L (ref 22–32)
Calcium: 8.7 mg/dL — ABNORMAL LOW (ref 8.9–10.3)
Chloride: 106 mmol/L (ref 98–111)
Creatinine, Ser: 0.67 mg/dL (ref 0.61–1.24)
GFR calc non Af Amer: >60 mL/min (ref >60)
Glucose, Bld: 101 mg/dL — ABNORMAL HIGH (ref 70–99)
Potassium: 4.1 mmol/L (ref 3.5–5.1)
SODIUM: 138 mmol/L (ref 135–145)
Total Bilirubin: 0.9 mg/dL (ref 0.3–1.2)
Total Protein: 7.2 g/dL (ref 6.5–8.1)

## 2018-07-24 LAB — CULTURE, BLOOD (ROUTINE X 2)
CULTURE: NO GROWTH
Culture: NO GROWTH
SPECIAL REQUESTS: ADEQUATE
Special Requests: ADEQUATE

## 2018-07-24 NOTE — Progress Notes (Signed)
CSW provided patient with bus pass.  Antony Blackbirdynthia Hilmer Aliberti, [facility] Clinical Social Worker [phone]

## 2018-07-24 NOTE — Plan of Care (Signed)
Problem: Education: Goal: Knowledge of General Education information will improve Description Including pain rating scale, medication(s)/side effects and non-pharmacologic comfort measures Outcome: Progressing   Problem: Health Behavior/Discharge Planning: Goal: Ability to manage health-related needs will improve Outcome: Progressing   Problem: Clinical Measurements: Goal: Respiratory complications will improve Outcome: Progressing   Problem: Activity: Goal: Risk for activity intolerance will decrease Outcome: Progressing   Problem: Nutrition: Goal: Adequate nutrition will be maintained Outcome: Progressing   Problem: Coping: Goal: Level of anxiety will decrease Outcome: Progressing   Problem: Elimination: Goal: Will not experience complications related to urinary retention Outcome: Progressing   Problem: Pain Managment: Goal: General experience of comfort will improve Outcome: Progressing   Problem: Safety: Goal: Ability to remain free from injury will improve Outcome: Progressing   Problem: Skin Integrity: Goal: Risk for impaired skin integrity will decrease Outcome: Progressing   

## 2018-07-24 NOTE — Plan of Care (Signed)
  Problem: Clinical Measurements: Goal: Will remain free from infection Outcome: Progressing   Problem: Clinical Measurements: Goal: Diagnostic test results will improve Outcome: Progressing   Problem: Activity: Goal: Risk for activity intolerance will decrease Outcome: Progressing   Problem: Pain Managment: Goal: General experience of comfort will improve Outcome: Progressing   Problem: Safety: Goal: Ability to remain free from injury will improve Outcome: Progressing   Problem: Skin Integrity: Goal: Risk for impaired skin integrity will decrease Outcome: Progressing

## 2018-07-24 NOTE — Progress Notes (Signed)
AVS given and reviewed with pt. Medications discussed and delivered to bedside. All questions answered to satisfaction. Azithromycin disposed per Irene LimboGoodrich, MD. Pt chose to walk off the unit.

## 2018-07-24 NOTE — Progress Notes (Signed)
ID Appointment/Care Coordination Note:   Jacob Gutierrez is a 32 y.o. male with HIV, +AIDS status and bilateral cellulitis r/t staph/strep folliculitis >> MRSA (R-bactrim/doxy), Group C Strep. He will have Biktarvy, Bactrim, Azithromycin, Z[facility]vox [facility].   He has an appointment arranged for Tuesday January 7th 2020 @ 11:15 am with Jacob AlbertsStephanie Miaisabella Bacorn, NP.   Will provide him with bus tickets today to assist with getting him to this appointment. He has been referred to bridge counselor however despite Mitch's recent attempts at contacting [name] he has been unable to reach him via hospital phone or cell phone.   Jacob AlbertsStephanie Peony Barner, MSN, NP-C [facility]  [email or web address] Pager: [phone]

## 2018-07-24 NOTE — Discharge Summary (Signed)
Physician Discharge Summary  Jacob Gutierrez:[id number] DOB: [birth year] DOA: 07/18/2018  PCP: Grayce Sessions, NP  Admit date: 07/18/2018 Discharge date: 07/24/2018  Recommendations for Outpatient Follow-up:   Staph folliculitis, cellulitis bilateral anterior lower legs --Continue linezolid   AIDS --continue Biktarvy, Bactrim    Elevated transaminases --Dr. Ninetta Lights recommended stopping azithromycin until follow-up in the outpatient setting.   Follow-up Information    [facility] for Infect[facility] Disease Follow up on 07/31/2018.   Specialty:  Infectious Diseases Why:  Appt with Judeth Cornfield, NP @ 11:15 am. If unable to make this appointment please call to reschedule. Patient provided with 2 bus tickets prior to discharge.  Contact information: [address] [place], [address] [place] [id number] mc [place] [address] [phone]           Discharge Diagnoses: Principal diagnosis is #1 1. Staph folliculitis, cellulitis bilateral anterior lower legs 2. AIDS 3. Elevated transaminases  Leukopenia, this is chronic dating back to at least 2013.  Discharge Condition: improved Disposition: home  Diet recommendation: regular  Filed Weights   07/18/18 2005 07/18/18 2011  Weight: 86.2 kg 86.2 kg    History of present illness:  32 year old man PMH AIDS, not on any HAART secondary to noncompliance, hospitalization recently for scrotal abscess, presented with bilateral wounds to the anterior tibias painful with drainage.  Admitted for cellulitis, folliculitis.  Hospital Course:  Patient was treated with empiric antibiotics and seen by infectious disease in consultation.  Culture data was reviewed by infectious disease with suggestion to treat with Zyvox and follow-up closely in the outpatient setting.  Of note although sepsis is previously documented I do not see any evidence of sepsis, specifically lactic acid was within normal limits and I do not  see any description or documentation of endorgan dysfunction.  Staph folliculitis, cellulitis bilateral anterior lower legs --Continue linezolid per infectious disease  AIDS --continue Biktarvy, Bactrim on discharge per ID  Elevated transaminases --Discussed with Dr. Ninetta Lights today, he recommends stopping azithromycin until follow-up in the outpatient setting.  Leukopenia, this is chronic dating back to at least 2013.  Today's assessment: S: Feels well, no complaints.  No leg pain at all. O: Vitals:  Vitals:   07/23/18 2056 07/24/18 0335  BP: (!) 118/94 111/73  Pulse: 90 84  Resp: 18 18  Temp: 98.1 F (36.7 C) 98.3 F (36.8 C)  SpO2: 100% 99%    Constitutional:  . Appears calm and comfortable Respiratory:  . CTA bilaterally, no w/r/r.  . Respiratory effort normal.  Cardiovascular:  . RRR, no m/r/g . No LE extremity edema   Skin:  . Wounds bilateral anterior tibia appear to be healing, there is borders marked from previous cellulitis apparently which appears resolved at this point.  There is scant drainage from the left sided wound.  There is no surrounding erythema. Psychiatric:  . Mental status o Mood, affect appropriate  Complete metabolic panel notable for AST of 82, ALT of 58, up 3-4 times since 12/29. WBC chronically low dating back to 2013.  Today 2.7.  Hemoglobin stable 11.8.  Platelets within normal limits.  Discharge Instructions  Discharge Instructions    Activity as tolerated - No restrictions   Complete by:  As directed    Diet general   Complete by:  As directed    Discharge instructions   Complete by:  As directed    Call your physician or seek immediate medical attention for fever, redness, drainage from wounds, pain or worsening of  condition.     Allergies as of 07/24/2018      Reactions   Amoxicillin Other (See Comments)   From childhood: "I had a reaction when i was little." (??)   Vancomycin Itching   Angioedema      Medication List      STOP taking these medications   cephALEXin 500 MG capsule Commonly known as:  KEFLEX   sulfamethoxazole-trimethoprim 800-160 MG tablet Commonly known as:  BACTRIM DS,SEPTRA DS Replaced by:  sulfamethoxazole-trimethoprim 400-80 MG tablet     TAKE these medications   bictegravir-emtricitabine-tenofovir AF 50-200-25 MG Tabs tablet Commonly known as:  BIKTARVY Take 1 tablet by mouth daily.   linezolid 600 MG tablet Commonly known as:  ZYVOX Take 1 tablet (600 mg total) by mouth 2 (two) times daily.   sulfamethoxazole-trimethoprim 400-80 MG tablet Commonly known as:  BACTRIM Take 1 tablet by mouth daily. Replaces:  sulfamethoxazole-trimethoprim 800-160 MG tablet      Allergies  Allergen Reactions  . Amoxicillin Other (See Comments)    From childhood: "I had a reaction when i was little." (??)  . Vancomycin Itching    Angioedema    The results of significant diagnostics from this hospitalization (including imaging, microbiology, ancillary and laboratory) are listed below for reference.    Significant Diagnostic Studies: No results found.  Microbiology: Recent Results (from the past 240 hour(s))  Aerobic Culture (superficial specimen)     Status: None   Collection Time: 07/18/18 10:09 PM  Result Value Ref Range Status   Specimen Description WOUND LEFT LEG  Final   Special Requests Immunocompromised  Final   Gram Stain   Final    MODERATE WBC PRESENT, PREDOMINANTLY PMN ABUNDANT GRAM POSITIVE COCCI Performed at [facility], [address] [place], [place] [address]    Culture   Final    ABUNDANT METHICILLIN RESISTANT STAPHYLOCOCCUS AUREUS MODERATE STREPTOCOCCUS GROUP G    Report Status 07/21/2018 FINAL  Final   Organism ID, Bacteria METHICILLIN RESISTANT STAPHYLOCOCCUS AUREUS  Final      Susceptibility   Methicillin resistant staphylococcus aureus - MIC*    CIPROFLOXACIN >=8 RESISTANT Resistant     ERYTHROMYCIN >=8 RESISTANT Resistant     GENTAMICIN  <=0.5 SENSITIVE Sensitive     OXACILLIN >=4 RESISTANT Resistant     TETRACYCLINE >=16 RESISTANT Resistant     VANCOMYCIN <=0.5 SENSITIVE Sensitive     TRIMETH/SULFA 20 SENSITIVE Sensitive     CLINDAMYCIN <=0.25 SENSITIVE Sensitive     RIFAMPIN <=0.5 SENSITIVE Sensitive     Inducible Clindamycin NEGATIVE Sensitive     * ABUNDANT METHICILLIN RESISTANT STAPHYLOCOCCUS AUREUS  Aerobic Culture (superficial specimen)     Status: None   Collection Time: 07/18/18 10:09 PM  Result Value Ref Range Status   Specimen Description WOUND RIGHT LEG  Final   Special Requests Immunocompromised  Final   Gram Stain   Final    MODERATE WBC PRESENT, PREDOMINANTLY PMN MODERATE GRAM POSITIVE COCCI FEW GRAM POSITIVE RODS Performed at [facility], [address] [place], [place] [address]    Culture   Final    MODERATE METHICILLIN RESISTANT STAPHYLOCOCCUS AUREUS FEW STREPTOCOCCUS GROUP G    Report Status 07/21/2018 FINAL  Final   Organism ID, Bacteria METHICILLIN RESISTANT STAPHYLOCOCCUS AUREUS  Final      Susceptibility   Methicillin resistant staphylococcus aureus - MIC*    CIPROFLOXACIN >=8 RESISTANT Resistant     ERYTHROMYCIN >=8 RESISTANT Resistant  GENTAMICIN <=0.5 SENSITIVE Sensitive     OXACILLIN >=4 RESISTANT Resistant     TETRACYCLINE >=16 RESISTANT Resistant     VANCOMYCIN <=0.5 SENSITIVE Sensitive     TRIMETH/SULFA 20 SENSITIVE Sensitive     CLINDAMYCIN <=0.25 SENSITIVE Sensitive     RIFAMPIN <=0.5 SENSITIVE Sensitive     Inducible Clindamycin NEGATIVE Sensitive     * MODERATE METHICILLIN RESISTANT STAPHYLOCOCCUS AUREUS  Culture, blood (routine x 2)     Status: None   Collection Time: 07/18/18 11:45 PM  Result Value Ref Range Status   Specimen Description BLOOD RIGHT WRIST  Final   Special Requests   Final    BOTTLES DRAWN AEROBIC AND ANAEROBIC Blood Culture adequate volume   Culture   Final    NO GROWTH 5 DAYS Performed at [facility], [address],  [place], [place] [address]    Report Status 07/24/2018 FINAL  Final  Culture, blood (routine x 2)     Status: None   Collection Time: 07/19/18 12:01 AM  Result Value Ref Range Status   Specimen Description BLOOD LEFT WRIST  Final   Special Requests   Final    BOTTLES DRAWN AEROBIC AND ANAEROBIC Blood Culture adequate volume   Culture   Final    NO GROWTH 5 DAYS Performed at [facility], [address], [place], [place] [address]    Report Status 07/24/2018 FINAL  Final  Culture, Urine     Status: Abnormal   Collection Time: 07/19/18 12:18 AM  Result Value Ref Range Status   Specimen Description URINE, RANDOM  Final   Special Requests NONE  Final   Culture (A)  Final    <10,000 COLONIES/mL INSIGNIFICANT GROWTH Performed at [facility], [address], [place], [place] [address]    Report Status 07/20/2018 FINAL  Final  MRSA PCR Screening     Status: Abnormal   Collection Time: 07/19/18  2:23 AM  Result Value Ref Range Status   MRSA by PCR POSITIVE (A) NEGATIVE Final    Comment:        The GeneXpert MRSA Assay (FDA approved for NASAL specimens only), is one component of a comprehensive MRSA colonization surveillance program. It is not intended to diagnose MRSA infection nor to guide or monitor treatment for MRSA infections. RESULT CALLED TO, READ BACK BY AND VERIFIED WITH: RN Erven Colla [id number] MLM      Labs: Basic Metabolic Panel: Recent Labs  Lab 07/19/18 0222 07/20/18 0138 07/21/18 0409 07/22/18 0618 07/24/18 0235  NA 135 136 133* 139 138  K 3.7 4.2 4.3 3.9 4.1  CL 104 102 100 105 106  CO2 22 25 21* 26 23  GLUCOSE 106* 118* 104* 129* 101*  BUN 16 12 12 14  22*  CREATININE 0.65 0.68 0.60* 0.75 0.67  CALCIUM 8.0* 8.4* 8.3* 8.5* 8.7*   Liver Function Tests: Recent Labs  Lab 07/18/18 2237 07/22/18 0618 07/24/18 0235  AST 28 27 82*  ALT 15 17 58*  ALKPHOS 66 61 64  BILITOT 0.7 0.4 0.9  PROT 8.5* 7.6 7.2  ALBUMIN 3.3* 2.8* 2.9*    CBC: Recent Labs  Lab 07/18/18 2237 07/19/18 0222 07/21/18 0409 07/22/18 0618 07/23/18 0803 07/24/18 0235  WBC 6.4 5.6 2.8* 1.9* 2.2* 2.7*  NEUTROABS 4.6  --   --  0.7* 1.0* 1.4*  HGB 13.3 11.4* 11.4* 12.8* 12.8* 11.8*  HCT 39.9 33.3* 34.6* 38.1* 36.5* 36.6*  MCV 95.7 92.5 93.8  95.7 97.9 93.1  PLT 235 224 197 209 250 211    Principal Problem:   Sepsis (HCC) Active Problems:   AIDS (acquired immune deficiency syndrome) (HCC)   Homelessness   Personal history of MRSA (methicillin resistant Staphylococcus aureus)   History of ESBL E. coli infection   Cellulitis and abscess of right leg   Cellulitis and abscess of left leg   Acute lower UTI   Time coordinating discharge: 45 MINUTES  Signed:  Brendia Sacksaniel , MD [facility] 07/24/2018, 2:40 PM

## 2018-07-31 ENCOUNTER — Inpatient Hospital Stay: Payer: Medicaid Other | Admitting: Infectious Diseases

## 2018-08-02 ENCOUNTER — Ambulatory Visit (INDEPENDENT_AMBULATORY_CARE_PROVIDER_SITE_OTHER): Payer: Medicaid Other | Admitting: Infectious Diseases

## 2018-08-02 ENCOUNTER — Encounter: Payer: Self-pay | Admitting: Infectious Diseases

## 2018-08-02 VITALS — BP 117/85 | HR 80 | Temp 98.5°F | Wt 189.1 lb

## 2018-08-02 DIAGNOSIS — L03115 Cellulitis of right lower limb: Secondary | ICD-10-CM | POA: Diagnosis not present

## 2018-08-02 DIAGNOSIS — B20 Human immunodeficiency virus [HIV] disease: Secondary | ICD-10-CM | POA: Diagnosis not present

## 2018-08-02 DIAGNOSIS — L02416 Cutaneous abscess of left lower limb: Secondary | ICD-10-CM

## 2018-08-02 DIAGNOSIS — L03116 Cellulitis of left lower limb: Secondary | ICD-10-CM | POA: Diagnosis not present

## 2018-08-02 DIAGNOSIS — Z5900 Homelessness unspecified: Secondary | ICD-10-CM

## 2018-08-02 DIAGNOSIS — R945 Abnormal results of liver function studies: Secondary | ICD-10-CM | POA: Diagnosis not present

## 2018-08-02 DIAGNOSIS — L02415 Cutaneous abscess of right lower limb: Secondary | ICD-10-CM

## 2018-08-02 DIAGNOSIS — R7989 Other specified abnormal findings of blood chemistry: Secondary | ICD-10-CM

## 2018-08-02 DIAGNOSIS — K6282 Dysplasia of anus: Secondary | ICD-10-CM

## 2018-08-02 NOTE — Assessment & Plan Note (Signed)
Ongoing - working with [name] however I am not certain he is pulling his weight in effort to remedy this; that being said housing extremely tight at present.

## 2018-08-02 NOTE — Assessment & Plan Note (Signed)
Referral to general surgery for follow up anoscopy.

## 2018-08-02 NOTE — Assessment & Plan Note (Signed)
Same as above

## 2018-08-02 NOTE — Assessment & Plan Note (Signed)
Improved following linezolid course. Slow to heal d/t underlying poorly functioning immune system in setting of uncontrolled HIV/AIDS. No indication for antibiotics presently. Previously MRSA R-tetracyclines, bactrim and clindamycin.

## 2018-08-02 NOTE — Progress Notes (Signed)
Name: Jacob Gutierrez  DOB: May 16, 1986 MRN: [id number] PCP: Jacob Gutierrez, Jacob P, NP    Patient Active Problem List   Diagnosis Date Noted  . Elevated liver function tests 08/02/2018  . Cellulitis and abscess of right leg 07/19/2018  . Cellulitis and abscess of left leg 07/19/2018  . Condyloma 04/24/2018  . Protein-calorie malnutrition, severe 12/12/2017  . Personal history of MRSA (methicillin resistant Staphylococcus aureus) 12/09/2017  . History of ESBL E. coli infection 12/09/2017  . AIDS (acquired immune deficiency syndrome) (HCC)   . Homelessness   . 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 Tobacco use disorder 01/02/2014  . Human immunodeficiency virus (HIV) disease (HCC) 01/01/2014     Subjective:  CC:  Hospital follow up, HIV/AIDS, cellulitis follow up. No complaints today. Wondering about surgery referral for anal warts.   HPI:  Jacob Gutierrez required hospitalization for sepsis related to bilateral cellulitis/folliculitis due to MRSA and Group C streptococcus. He was treated with linezolid with clindamycin then transitioned to oral linezolid at discharge. He required prolonged stay due to wound care, pain control and unfit discharge conditions (homeless). He has had many episodes of recurrent skin infections and boils.   He has been back on his Jacob Gutierrez now since discharge after "being off for 2 months." He continues to take his bactrim once a day as well. Was told to stop azithromycin prior to discharge due to increased liver function prior to discharge that was not well understood otherwise.   He is still working with Jacob Gutierrez, although he frequently loses contact with him and will not respond. In a motel currently but will lose his room soon - working at [employer] presently. Denies fevers, chills, diarrhea, weightloss, shortness of breath, abdominal pain, cough, current/active skin lesions. Needs to leave to go to work.   Review of Systems  All other systems reviewed and are  negative.   Past Medical History:  Diagnosis Date  . 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 H/O coccidioidomycosis    pulmonary   . HIV disease (HCC)   . Past history of allergy to penicillin-type antibiotic 07-03-2012   desensitization  . PNA (pneumonia)   . Shigella gastroenteritis   . Syphilis    history /treated     Outpatient Medications Prior to Visit  Medication Sig Dispense Refill  . bictegravir-emtricitabine-tenofovir AF (BIKTARVY) 50-200-25 MG TABS tablet Take 1 tablet by mouth daily. 30 tablet 2  . linezolid (ZYVOX) 600 MG tablet Take 1 tablet (600 mg total) by mouth 2 (two) times daily. 20 tablet 0  . sulfamethoxazole-trimethoprim (BACTRIM) 400-80 MG tablet Take 1 tablet by mouth daily. 30 tablet 0   No facility-administered medications prior to visit.      Allergies  Allergen Reactions  . Amoxicillin Other (See Comments)    From childhood: "I had a reaction when i was little." (??)  . Vancomycin Itching    Angioedema    Social History   Tobacco Use  . Smoking status: Current Every Day Smoker    Packs/day: 0.50    Types: Cigarettes  . Smokeless tobacco: Never Used  . Tobacco comment: cutting back  Substance Use Topics  . Alcohol use: Yes    Alcohol/week: 1.0 standard drinks    Types: 1 Standard drinks or equivalent per week    Comment: whiskey occasionally   . Drug use: Yes    Frequency: 1.0 times per week    Types: Marijuana    Social History  Substance and Sexual Activity  Sexual Activity Yes  . Partners: Female, Male  . Birth control/protection: Condom     Objective:   Vitals:   08/02/18 1511  BP: 117/85  Pulse: 80  Temp: 98.5 F (36.9 C)  TempSrc: Oral  Weight: 189 lb 1.9 oz (85.8 kg)   Body mass index is 24.95 kg/m.  Physical Exam Constitutional:      Appearance: He is well-developed.     Comments: Seated comfortably in chair during visit. Distracted by phone most of the time. Little  eye contact. Rushed today.   HENT:     Nose: No congestion.     Mouth/Throat:     Mouth: Mucous membranes are moist.     Dentition: Normal dentition. No dental abscesses.     Pharynx: Oropharynx is clear.  Eyes:     General: No scleral icterus.    Pupils: Pupils are equal, round, and reactive to light.  Cardiovascular:     Rate and Rhythm: Normal rate and regular rhythm.     Heart sounds: Normal heart sounds. No murmur.  Pulmonary:     Effort: Pulmonary effort is normal. No respiratory distress.     Breath sounds: Normal breath sounds.  Abdominal:     General: There is no distension.     Palpations: Abdomen is soft.     Tenderness: There is no abdominal tenderness.  Genitourinary:    Comments: Deferred digital rectal exam.  Musculoskeletal:     Comments: Walking with a limp d/t pain   Lymphadenopathy:     Cervical: No cervical adenopathy.  Skin:    General: Skin is warm and dry.     Findings: No rash.     Comments: Multiple partially healed scars along legs. Bilateral lateral shins are currently clean, dry without tenderness, warmth, drainage. Full thickness skin loss still present without much evidence of healing. No acute/active infection.   Neurological:     Mental Status: He is alert and oriented to person, place, and time.  Psychiatric:        Judgment: Judgment normal.    Lab Results Lab Results  Component Value Date   WBC 2.7 (L) 07/24/2018   HGB 11.8 (L) 07/24/2018   HCT 36.6 (L) 07/24/2018   MCV 93.1 07/24/2018   PLT 211 07/24/2018    Lab Results  Component Value Date   CREATININE 0.67 07/24/2018   BUN 22 (H) 07/24/2018   NA 138 07/24/2018   K 4.1 07/24/2018   CL 106 07/24/2018   CO2 23 07/24/2018    Lab Results  Component Value Date   ALT 58 (H) 07/24/2018   AST 82 (H) 07/24/2018   ALKPHOS 64 07/24/2018   BILITOT 0.9 07/24/2018    Lab Results  Component Value Date   CHOL 126 12/17/2013   HDL 47 12/17/2013   LDLCALC 49 12/17/2013   TRIG 152  (H) 12/17/2013   CHOLHDL 2.7 12/17/2013   HIV 1 RNA Quant (copies/mL)  Date Value  07/20/2018 1,220,000  06/08/2018 95,100  04/24/2018 377,000 (H)   CD4 T Cell Abs (/uL)  Date Value  07/20/2018 10 (L)  04/24/2018 30 (L)  02/02/2018 60 (L)   Assessment & Plan:   Problem List Items Addressed This Visit      Unprioritized   Homelessness (Chronic)    Ongoing - working with Jacob Schiller however I am not certain he is pulling his weight in effort to remedy this; that being said housing extremely tight at present.  Dysplasia of anus    Referral to general surgery for follow up anoscopy.       AIDS (acquired immune deficiency syndrome) (HCC) - Primary    Off medications for a significant time again. Multiple barriers to care including homelessness, lack of personal motivation, ?depression, poor insight to medical condition and overall prognosis and frailty. He will continue biktarvy and bactrim once daily. Follow up again in 4 weeks to continue with counseling.       Relevant Orders   Hepatic function panel   Hepatitis panel, acute   Cellulitis and abscess of right leg    Improved following linezolid course. Slow to heal d/t underlying poorly functioning immune system in setting of uncontrolled HIV/AIDS. No indication for antibiotics presently. Previously MRSA R-tetracyclines, bactrim and clindamycin.       Cellulitis and abscess of left leg    Same as above.       Elevated liver function tests    Repeat today along with acute hepatitis panel with history of homelessness. No signs of worsening liver dysfunction today.          Rexene AlbertsStephanie Dixon, MSN, NP-C [facility] Pager: [phone] Office: [phone]  08/02/18  9:53 PM

## 2018-08-02 NOTE — Assessment & Plan Note (Signed)
Repeat today along with acute hepatitis panel with history of homelessness. No signs of worsening liver dysfunction today.

## 2018-08-02 NOTE — Assessment & Plan Note (Signed)
Off medications for a significant time again. Multiple barriers to care including homelessness, lack of personal motivation, ?depression, poor insight to medical condition and overall prognosis and frailty. He will continue biktarvy and bactrim once daily. Follow up again in 4 weeks to continue with counseling.

## 2018-08-02 NOTE — Patient Instructions (Addendum)
Please continue taking your Biktarvy and Bactrim every day.   Will repeat your liver function test today.   Please come back in 4 weeks to ensure you are doing OK and repeat your labs.

## 2018-08-03 LAB — HEPATIC FUNCTION PANEL
AG Ratio: 0.9 (calc) — ABNORMAL LOW (ref 1.0–2.5)
ALBUMIN MSPROF: 3.7 g/dL (ref 3.6–5.1)
ALT: 28 U/L (ref 9–46)
AST: 28 U/L (ref 10–40)
Alkaline phosphatase (APISO): 89 U/L (ref 40–115)
BILIRUBIN DIRECT: 0.1 mg/dL (ref 0.0–0.2)
Globulin: 4 g/dL (calc) — ABNORMAL HIGH (ref 1.9–3.7)
Indirect Bilirubin: 0.4 mg/dL (calc) (ref 0.2–1.2)
Total Bilirubin: 0.5 mg/dL (ref 0.2–1.2)
Total Protein: 7.7 g/dL (ref 6.1–8.1)

## 2018-08-03 LAB — HEPATITIS PANEL, ACUTE
HEP B S AG: NONREACTIVE
Hep A IgM: NONREACTIVE
Hep B C IgM: NONREACTIVE
Hepatitis C Ab: NONREACTIVE
SIGNAL TO CUT-OFF: 0.13 (ref <1.00)

## 2018-09-01 ENCOUNTER — Other Ambulatory Visit: Payer: Self-pay

## 2018-09-01 ENCOUNTER — Emergency Department (HOSPITAL_COMMUNITY)
Admission: EM | Admit: 2018-09-01 | Discharge: 2018-09-02 | Disposition: A | Discharge status: Home / Self Care | Payer: Medicaid Other | Attending: Emergency Medicine | Admitting: Emergency Medicine

## 2018-09-01 ENCOUNTER — Encounter (HOSPITAL_COMMUNITY): Payer: Self-pay | Admitting: Emergency Medicine

## 2018-09-01 DIAGNOSIS — L02416 Cutaneous abscess of left lower limb: Secondary | ICD-10-CM

## 2018-09-01 DIAGNOSIS — F1290 Cannabis use, unspecified, uncomplicated: Secondary | ICD-10-CM | POA: Diagnosis not present

## 2018-09-01 DIAGNOSIS — F17210 Nicotine dependence, cigarettes, uncomplicated: Secondary | ICD-10-CM | POA: Insufficient documentation

## 2018-09-01 DIAGNOSIS — B20 Human immunodeficiency virus [HIV] disease: Secondary | ICD-10-CM | POA: Diagnosis not present

## 2018-09-01 HISTORY — DX: Human immunodeficiency virus (HIV) disease: B20

## 2018-09-01 NOTE — ED Triage Notes (Signed)
Patient reports skin abscess at left upper anterior thigh with drainage onset this week , denies fever or chills .

## 2018-09-02 NOTE — ED Provider Notes (Signed)
[facility] EMERGENCY DEPARTMENT Provider Note   CSN: [id number] Arrival date & time: 09/01/18  2214     History   Chief Complaint Chief Complaint  Patient presents with  . Abscess    HPI Jacob Gutierrez is a 33 y.o. male with a history of AIDS (viral load 1.2 million on 12/19 CD count 10), pulmonary coccidioidomycosis, and syphilis who presents to the emergency department with a chief complaint of left thigh infection.  The patient reports he developed a wound on his left thigh approximately 1 week ago.  He has a history of similar on his bilateral lower extremities.  He reports over the last few days the area became increasingly painful, red, hot to the touch so he "popped" it.  The area initially drained purulent drainage, but reports the drainage has significantly improved.  He reports the warmth and swelling to the area have also significantly improved over the last few days.  He has had no fever or chills.   States he came to the ED for evaluation for worsening pain to the wound since earlier today.  He states the pain worsened after the band of his underwear got caught on the wound.  He has not taken any medication for pain or fever since yesterday.  He reports that sometime over the last week he restarted his Biktarvy, Bactrim, and linezolid antibiotics that he was prescribed after a hospitalization in December.  He reports that he is between housing right now and he often forgets to take his medication.  He is followed by infectious disease and was last seen January 9.  The history is provided by the patient. No language interpreter was used.    Past Medical History:  Diagnosis Date  . AIDS (acquired immune deficiency syndrome) (HCC)   . AIDS (acquired immune deficiency syndrome) (HCC)   . 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 H/O coccidioidomycosis    pulmonary   . HIV disease (HCC)   . Past history of allergy  to penicillin-type antibiotic 07-03-2012   desensitization  . PNA (pneumonia)   . Shigella gastroenteritis   . Syphilis    history /treated     Patient Active Problem List   Diagnosis Date Noted  . Elevated liver function tests 08/02/2018  . Cellulitis and abscess of right leg 07/19/2018  . Cellulitis and abscess of left leg 07/19/2018  . Condyloma 04/24/2018  . Protein-calorie malnutrition, severe 12/12/2017  . Personal history of MRSA (methicillin resistant Staphylococcus aureus) 12/09/2017  . History of ESBL E. coli infection 12/09/2017  . AIDS (acquired immune deficiency syndrome) (HCC)   . Homelessness   . 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 Tobacco use disorder 01/02/2014  . Human immunodeficiency virus (HIV) disease (HCC) 01/01/2014    Past Surgical History:  Procedure Laterality Date  . TRANSURETHRAL RESECTION OF PROSTATE N/A 12/09/2017   Procedure: TRANSURETHRAL RESECTION OF THE PROSTATE (TURP);  Surgeon: Crist Fat, MD;  Location: [facility];  Service: Urology;  Laterality: N/A;        Home Medications    Prior to Admission medications   Medication Sig Start Date End Date Taking? Authorizing Provider  bictegravir-emtricitabine-tenofovir AF (BIKTARVY) 50-200-25 MG TABS tablet Take 1 tablet by mouth daily. 07/23/18  Yes Rolly Salter, MD  cephALEXin (KEFLEX) 500 MG capsule Take 500 mg by mouth 4 (four) times daily.   Yes [provider]  linezolid (ZYVOX) 600 MG tablet Take 1  tablet (600 mg total) by mouth 2 (two) times daily. 07/19/18  Yes Ghimire, Werner LeanShanker M, MD  sulfamethoxazole-trimethoprim (BACTRIM) 400-80 MG tablet Take 1 tablet by mouth daily. Patient taking differently: Take 1 tablet by mouth 2 (two) times daily.  07/20/18  Yes Sherryll BurgerShah, Pratik D, DO    Family History Family History  Problem Relation Age of Onset  . Hypertension Mother   . Lupus Maternal Grandmother   . Cancer Paternal Grandfather     Social History Social History   Tobacco  Use  . Smoking status: Current Every Day Smoker    Packs/day: 0.50    Types: Cigarettes  . Smokeless tobacco: Never Used  . Tobacco comment: cutting back  Substance Use Topics  . Alcohol use: Yes    Alcohol/week: 1.0 standard drinks    Types: 1 Standard drinks or equivalent per week    Comment: whiskey occasionally   . Drug use: Yes    Frequency: 1.0 times per week    Types: Marijuana     Allergies   Amoxicillin and Vancomycin   Review of Systems Review of Systems  Constitutional: Negative for appetite change, chills and fever.  Respiratory: Negative for shortness of breath.   Cardiovascular: Negative for chest pain.  Gastrointestinal: Negative for abdominal pain.  Genitourinary: Negative for dysuria.  Musculoskeletal: Negative for arthralgias, back pain and myalgias.  Skin: Positive for color change and wound. Negative for rash.  Allergic/Immunologic: Negative for immunocompromised state.  Neurological: Negative for headaches.  Psychiatric/Behavioral: Negative for confusion.     Physical Exam Updated Vital Signs BP 103/72 (BP Location: Left Arm)   Pulse 83   Temp 97.6 F (36.4 C)   Resp 16   Ht 6' (1.829 m)   Wt 85.7 kg   SpO2 99%   BMI 25.63 kg/m   Physical Exam Vitals signs and nursing note reviewed.  Constitutional:      Appearance: He is well-developed.     Comments: Well-appearing.  He is not ill-appearing or toxic.  HENT:     Head: Normocephalic.  Eyes:     Conjunctiva/sclera: Conjunctivae normal.  Neck:     Musculoskeletal: Neck supple.  Cardiovascular:     Rate and Rhythm: Normal rate and regular rhythm.     Heart sounds: No murmur.  Pulmonary:     Effort: Pulmonary effort is normal.  Abdominal:     General: There is no distension.     Palpations: Abdomen is soft.  Musculoskeletal:     Comments: Indurated area to the left antero-lateral thigh with a central opening.  No purulent drainage is able to be expressed.  There is overlying  redness, but no warmth or fluctuance.  No red streaking.  Minimal tenderness with palpation.  There is a small draining wound to the left shin.  Minimal warmth.  No purulent drainage.  No red streaking.  Skin:    General: Skin is warm and dry.  Neurological:     Mental Status: He is alert.  Psychiatric:        Behavior: Behavior normal.    Left thigh    Left lower leg     ED Treatments / Results  Labs (all labs ordered are listed, but only abnormal results are displayed) Labs Reviewed - No data to display  EKG None  Radiology No results found.  Procedures Procedures (including critical care time) EMERGENCY DEPARTMENT US SOFT TISSUE INTERPRETATION "Study: Limited Soft Tissue Ultrasound"  INDICATIONS: Soft tissue infection Multiple views of the  body part were obtained in real-time with a multi-frequency linear probe  PERFORMED BY: Myself IMAGES ARCHIVED?: Yes SIDE:Left BODY PART:Lower extremity INTERPRETATION:  No abcess noted    Medications Ordered in ED Medications - No data to display   Initial Impression / Assessment and Plan / ED Course  I have reviewed the triage vital signs and the nursing notes.  Pertinent labs & imaging results that were available during my care of the patient were reviewed by me and considered in my medical decision making (see chart for details).     33 year old male with a history of AIDS (viral load 1.2 million on 12/19 CD count 10), pulmonary coccidioidomycosis, and syphilis.  He has a history of poor compliance with his Biktarvy.  Per chart review, he has required multiple hospitalizations for sepsis secondary to skin infections.  Today, he is well-appearing.  He does not appear toxic.  His vital signs are normal.  He does not endorse fever or chills over the last few days.  He has a wound to his left thigh that has been draining and improving since onset over the last few days since restarting Bactrim and linezolid.  He arrives  with his home medications on account at least 8 tablets of linezolid.  Bedside ultrasound does not show any evidence of deep space infection or drainable areas of abscess.  He also reports his symptoms have been significantly improving over the last few days since restarting antibiotics.  Will defer wound culture at this time since he is Artie started antimicrobials.  We discussed at length the importance of compliance with his home medications.  His housing situation is complicated and is contributing to his lack of compliance.  I discussed at length that if he is discharged he should follow-up with infectious disease for a wound check in 2 to 3 days.  Wound care and copious irrigation of the wound performed by me in the ER.  Labs from previous admissions reviewed.  Although he has a history of leukopenia and neutropenia, given his overall clinical appearance today and significant improvement with his home antibiotics, I feel safe with discharging him to home with outpatient follow-up.  I have also sent a message to his nurse practitioner at infectious disease. He was also given very strict return precautions to the emergency department if symptoms worsen.  He is in no acute distress and is safe for discharge at this time.  Final Clinical Impressions(s) / ED Diagnoses   Final diagnoses:  Abscess of left thigh  AIDS (acquired immune deficiency syndrome) [facility])    ED Discharge Orders    None       McDonald, Mia A, PA-C 09/02/18 0058    Dione Booze, MD 09/02/18 [phone]

## 2018-09-02 NOTE — Discharge Instructions (Signed)
Thank you for allowing me to care for you today in the Emergency Department.   It is extremely important that you continue to take your linezoid, Bactrim, (sulfamethoxazole-trimethoprim) and BIKTARVY as prescribed. Do not miss any doses.   To care for your wound at home, clean the area once daily with warm water and soap.  Apply a clean gauze dressing at least once daily.  Change the gauze dressing anytime that it gets dirty or soiled.  I have attached additional instructions on wound care.  Please call and schedule follow-up appointment for recheck with infectious disease or primary care on Monday or Tuesday.  If your symptoms significantly worsen, including if you develop fever, chills, if you start to have worsening thick, mucus-like drainage from the wound.  If the wound gets hot to the touch and bright red that continues to spread, you should return to the emergency department for reevaluation.

## 2018-09-03 NOTE — Progress Notes (Deleted)
Name: Jacob Gutierrez  DOB: January 02, 1986 MRN: [id number] PCP: Grayce Sessions, NP    Patient Active Problem List   Diagnosis Date Noted  . Elevated liver function tests 08/02/2018  . Cellulitis and abscess of right leg 07/19/2018  . Cellulitis and abscess of left leg 07/19/2018  . Condyloma 04/24/2018  . Protein-calorie malnutrition, severe 12/12/2017  . Personal history of MRSA (methicillin resistant Staphylococcus aureus) 12/09/2017  . History of ESBL E. coli infection 12/09/2017  . AIDS (acquired immune deficiency syndrome) (HCC)   . Homelessness   . 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 Tobacco use disorder 01/02/2014  . Human immunodeficiency virus (HIV) disease (HCC) 01/01/2014     Subjective:  CC:  ER follow up, HIV/AIDS follow up.  No complaints today.   HPI:  Interval history noted for ER visit 3 days ago where he required drainage of abscess of left thigh. He told the PA that cared for him that he was not taking his Biktarvy regularly but started taking it about 5 days prior to ER visit for some reason. Started taking left over Bactrim and Linezolid as well when he had abscess come up again.     Review of Systems  All other systems reviewed and are negative.   Past Medical History:  Diagnosis Date  . AIDS (acquired immune deficiency syndrome) (HCC)   . AIDS (acquired immune deficiency syndrome) (HCC)   . 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 H/O coccidioidomycosis    pulmonary   . HIV disease (HCC)   . Past history of allergy to penicillin-type antibiotic 07-03-2012   desensitization  . PNA (pneumonia)   . Shigella gastroenteritis   . Syphilis    history /treated     Outpatient Medications Prior to Visit  Medication Sig Dispense Refill  . bictegravir-emtricitabine-tenofovir AF (BIKTARVY) 50-200-25 MG TABS tablet Take 1 tablet by mouth daily. 30 tablet 2  . cephALEXin (KEFLEX) 500 MG capsule Take 500 mg by mouth 4  (four) times daily.    Marland Kitchen linezolid (ZYVOX) 600 MG tablet Take 1 tablet (600 mg total) by mouth 2 (two) times daily. 20 tablet 0  . sulfamethoxazole-trimethoprim (BACTRIM) 400-80 MG tablet Take 1 tablet by mouth daily. (Patient taking differently: Take 1 tablet by mouth 2 (two) times daily. ) 30 tablet 0   No facility-administered medications prior to visit.      Allergies  Allergen Reactions  . Amoxicillin Other (See Comments)    From childhood: "I had a reaction when i was little." (??)  . Vancomycin Itching    Angioedema    Social History   Tobacco Use  . Smoking status: Current Every Day Smoker    Packs/day: 0.50    Types: Cigarettes  . Smokeless tobacco: Never Used  . Tobacco comment: cutting back  Substance Use Topics  . Alcohol use: Yes    Alcohol/week: 1.0 standard drinks    Types: 1 Standard drinks or equivalent per week    Comment: whiskey occasionally   . Drug use: Yes    Frequency: 1.0 times per week    Types: Marijuana    Social History   Substance and Sexual Activity  Sexual Activity Yes  . Partners: Female, Male  . Birth control/protection: Condom     Objective:   There were no vitals filed for this visit. There is no height or weight on file to calculate BMI.  Physical Exam Constitutional:  Appearance: He is well-developed.     Comments: Seated comfortably in chair during visit. Distracted by phone most of the time. Little eye contact. Rushed today.   HENT:     Nose: No congestion.     Mouth/Throat:     Mouth: Mucous membranes are moist.     Dentition: Normal dentition. No dental abscesses.     Pharynx: Oropharynx is clear.  Eyes:     General: No scleral icterus.    Pupils: Pupils are equal, round, and reactive to light.  Cardiovascular:     Rate and Rhythm: Normal rate and regular rhythm.     Heart sounds: Normal heart sounds. No murmur.  Pulmonary:     Effort: Pulmonary effort is normal. No respiratory distress.     Breath sounds:  Normal breath sounds.  Abdominal:     General: There is no distension.     Palpations: Abdomen is soft.     Tenderness: There is no abdominal tenderness.  Genitourinary:    Comments: Deferred digital rectal exam.  Musculoskeletal:     Comments: Walking with a limp d/t pain   Lymphadenopathy:     Cervical: No cervical adenopathy.  Skin:    General: Skin is warm and dry.     Findings: No rash.     Comments: Multiple partially healed scars along legs. Bilateral lateral shins are currently clean, dry without tenderness, warmth, drainage. Full thickness skin loss still present without much evidence of healing. No acute/active infection.   Neurological:     Mental Status: He is alert and oriented to person, place, and time.  Psychiatric:        Judgment: Judgment normal.    Lab Results Lab Results  Component Value Date   WBC 2.7 (L) 07/24/2018   HGB 11.8 (L) 07/24/2018   HCT 36.6 (L) 07/24/2018   MCV 93.1 07/24/2018   PLT 211 07/24/2018    Lab Results  Component Value Date   CREATININE 0.67 07/24/2018   BUN 22 (H) 07/24/2018   NA 138 07/24/2018   K 4.1 07/24/2018   CL 106 07/24/2018   CO2 23 07/24/2018    Lab Results  Component Value Date   ALT 28 08/02/2018   AST 28 08/02/2018   ALKPHOS 64 07/24/2018   BILITOT 0.5 08/02/2018    Lab Results  Component Value Date   CHOL 126 12/17/2013   HDL 47 12/17/2013   LDLCALC 49 12/17/2013   TRIG 152 (H) 12/17/2013   CHOLHDL 2.7 12/17/2013   HIV 1 RNA Quant (copies/mL)  Date Value  07/20/2018 1,220,000  06/08/2018 95,100  04/24/2018 377,000 (H)   CD4 T Cell Abs (/uL)  Date Value  07/20/2018 10 (L)  04/24/2018 30 (L)  02/02/2018 60 (L)   Assessment & Plan:   Problem List Items Addressed This Visit    None      Rexene AlbertsStephanie Jayquan Bradsher, MSN, NP-C [facility] Pager: [phone] Office: [phone]  09/03/18  11:39 PM

## 2018-09-04 ENCOUNTER — Ambulatory Visit: Payer: Medicaid Other | Admitting: Infectious Diseases

## 2018-09-06 ENCOUNTER — Ambulatory Visit (INDEPENDENT_AMBULATORY_CARE_PROVIDER_SITE_OTHER): Payer: Medicaid Other | Admitting: Infectious Diseases

## 2018-09-06 ENCOUNTER — Encounter: Payer: Self-pay | Admitting: Infectious Diseases

## 2018-09-06 ENCOUNTER — Ambulatory Visit (INDEPENDENT_AMBULATORY_CARE_PROVIDER_SITE_OTHER): Payer: Medicaid Other | Admitting: Licensed Clinical Social Worker

## 2018-09-06 VITALS — BP 107/72 | HR 91 | Temp 98.2°F | Ht 77.0 in

## 2018-09-06 DIAGNOSIS — F438 Other reactions to severe stress: Secondary | ICD-10-CM

## 2018-09-06 DIAGNOSIS — F331 Major depressive disorder, recurrent, moderate: Secondary | ICD-10-CM | POA: Diagnosis not present

## 2018-09-06 DIAGNOSIS — F4389 Other reactions to severe stress: Secondary | ICD-10-CM

## 2018-09-06 DIAGNOSIS — L03116 Cellulitis of left lower limb: Secondary | ICD-10-CM | POA: Diagnosis not present

## 2018-09-06 DIAGNOSIS — L02416 Cutaneous abscess of left lower limb: Secondary | ICD-10-CM | POA: Diagnosis not present

## 2018-09-06 DIAGNOSIS — Z5900 Homelessness unspecified: Secondary | ICD-10-CM

## 2018-09-06 DIAGNOSIS — B20 Human immunodeficiency virus [HIV] disease: Secondary | ICD-10-CM | POA: Diagnosis present

## 2018-09-06 MED ORDER — DARUN-COBIC-EMTRICIT-TENOFAF 800-150-200-10 MG PO TABS: 1.0000 | ORAL_TABLET | Freq: Every day | ORAL | 5 refills | Status: DC

## 2018-09-06 MED ORDER — AZITHROMYCIN 600 MG PO TABS: 600.0000 mg | ORAL_TABLET | ORAL | 3 refills | Status: DC

## 2018-09-06 NOTE — Progress Notes (Signed)
Name: Jacob Gutierrez  DOB: [birth year] MRN: [id number] PCP: Grayce SessionsEdwards, Michelle P, NP    Patient Active Problem List   Diagnosis Date Noted  . 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 Cellulitis and abscess of right leg 07/19/2018  . Condyloma 04/24/2018  . Protein-calorie malnutrition, severe 12/12/2017  . Personal history of MRSA (methicillin resistant Staphylococcus aureus) 12/09/2017  . History of ESBL E. coli infection 12/09/2017  . AIDS (acquired immune deficiency syndrome) (HCC)   . Homelessness   . 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 Tobacco use disorder 01/02/2014  . Human immunodeficiency virus (HIV) disease (HCC) 01/01/2014     Subjective:  CC:  ER follow up, HIV/AIDS follow up. No complaints today aside from "Mitch making me do counseling."   HPI:  Interval history noted for ER visit 3 days ago where he required drainage of abscess of left thigh. He told the PA that cared for him that he was not taking his Biktarvy regularly but started taking it about 5 days prior to ER visit for some reason. Started taking left over Bactrim and Linezolid as well when he had abscess come up again.   In discussion with [name] he cites the only reason he does not take his medications is due to unstable housing. He feels that this is the only reason nothing deeper. He "does not let things get to him; just goes with the flow now; no use crying over spilt milk." His leg still has a sizeable hole but no drainage and no pain. He Says he is taking his biktarvy presently.    Review of Systems  Constitutional: Negative for chills, fever and malaise/fatigue.  HENT: Negative for sore throat.   Respiratory: Negative for cough and shortness of breath.   Cardiovascular: Negative for chest pain.  Gastrointestinal: Negative for abdominal pain, diarrhea and vomiting.  Genitourinary: Negative for dysuria.  Musculoskeletal: Negative for myalgias.  Skin: Positive for rash.  Neurological: Negative for dizziness  and headaches.  Psychiatric/Behavioral: Negative for depression. The patient is not nervous/anxious and does not have insomnia.     Past Medical History:  Diagnosis Date  . AIDS (acquired immune deficiency syndrome) (HCC)   . AIDS (acquired immune deficiency syndrome) (HCC)   . 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 H/O coccidioidomycosis    pulmonary   . HIV disease (HCC)   . Past history of allergy to penicillin-type antibiotic 07-03-2012   desensitization  . PNA (pneumonia)   . Shigella gastroenteritis   . Syphilis    history /treated     Outpatient Medications Prior to Visit  Medication Sig Dispense Refill  . bictegravir-emtricitabine-tenofovir AF (BIKTARVY) 50-200-25 MG TABS tablet Take 1 tablet by mouth daily. 30 tablet 2  . cephALEXin (KEFLEX) 500 MG capsule Take 500 mg by mouth 4 (four) times daily.    Marland Kitchen. linezolid (ZYVOX) 600 MG tablet Take 1 tablet (600 mg total) by mouth 2 (two) times daily. 20 tablet 0  . sulfamethoxazole-trimethoprim (BACTRIM) 400-80 MG tablet Take 1 tablet by mouth daily. (Patient taking differently: Take 1 tablet by mouth 2 (two) times daily. ) 30 tablet 0   No facility-administered medications prior to visit.      Allergies  Allergen Reactions  . Amoxicillin Other (See Comments)    From childhood: "I had a reaction when i was little." (??)  . Vancomycin Itching    Angioedema    Social History   Tobacco Use  . Smoking status:  Current Every Day Smoker    Packs/day: 0.50    Types: Cigarettes  . Smokeless tobacco: Never Used  . Tobacco comment: cutting back  Substance Use Topics  . Alcohol use: Yes    Alcohol/week: 1.0 standard drinks    Types: 1 Standard drinks or equivalent per week    Comment: whiskey occasionally   . Drug use: Yes    Frequency: 1.0 times per week    Types: Marijuana    Social History   Substance and Sexual Activity  Sexual Activity Yes  . Partners: Female, Male  . Birth  control/protection: Condom     Objective:   Vitals:   09/06/18 1505  BP: 107/72  Pulse: 91  Temp: 98.2 F (36.8 C)  TempSrc: Oral  Height: 6\' 5"  (1.956 m)   Body mass index is 22.41 kg/m.  Physical Exam Constitutional:      Appearance: He is well-developed.  HENT:     Nose: No congestion.     Mouth/Throat:     Mouth: Mucous membranes are moist.     Dentition: Normal dentition. No dental abscesses.     Pharynx: Oropharynx is clear.  Eyes:     General: No scleral icterus.    Pupils: Pupils are equal, round, and reactive to light.  Cardiovascular:     Rate and Rhythm: Normal rate and regular rhythm.     Heart sounds: Normal heart sounds. No murmur.  Pulmonary:     Effort: Pulmonary effort is normal. No respiratory distress.     Breath sounds: Normal breath sounds.  Abdominal:     General: There is no distension.     Palpations: Abdomen is soft.     Tenderness: There is no abdominal tenderness.  Lymphadenopathy:     Cervical: No cervical adenopathy.  Skin:    General: Skin is warm and dry.     Comments: Multiple partially healed scars along legs. Bilateral lateral shins where previous I&D required are healing. Left anterior thigh with 0.3 cm hole. Periwound with cracked skin and some mild residual swelling. No drainage, warmth or signs of infection.   Neurological:     Mental Status: He is alert and oriented to person, place, and time.    Lab Results Lab Results  Component Value Date   WBC 2.7 (L) 07/24/2018   HGB 11.8 (L) 07/24/2018   HCT 36.6 (L) 07/24/2018   MCV 93.1 07/24/2018   PLT 211 07/24/2018    Lab Results  Component Value Date   CREATININE 0.67 07/24/2018   BUN 22 (H) 07/24/2018   NA 138 07/24/2018   K 4.1 07/24/2018   CL 106 07/24/2018   CO2 23 07/24/2018    Lab Results  Component Value Date   ALT 28 08/02/2018   AST 28 08/02/2018   ALKPHOS 64 07/24/2018   BILITOT 0.5 08/02/2018    Lab Results  Component Value Date   CHOL 126  12/17/2013   HDL 47 12/17/2013   LDLCALC 49 12/17/2013   TRIG 152 (H) 12/17/2013   CHOLHDL 2.7 12/17/2013   HIV 1 RNA Quant (copies/mL)  Date Value  07/20/2018 1,220,000  06/08/2018 95,100  04/24/2018 377,000 (H)   CD4 T Cell Abs (/uL)  Date Value  07/20/2018 10 (L)  04/24/2018 30 (L)  02/02/2018 60 (L)   Assessment & Plan:   Problem List Items Addressed This Visit      Unprioritized   AIDS (acquired immune deficiency syndrome) (HCC) - Primary    Continue  bactrim once a day. Re-trial of azithromycin but will separate dose to 600 mg once every 3 days instead of weekly dose d/t acutely elevated LFTs last time, that may be unrelated.       Relevant Medications   azithromycin (ZITHROMAX) 600 MG tablet   Darunavir-Cobicisctat-Emtricitabine-Tenofovir Alafenamide (SYMTUZA) 800-150-200-10 MG TABS   Avoidance coping    I spent a lot of time discussing Mitch and my recommendation for talk therapy sessions with Rene Kocheregina. I explained to him that not dealing with issMarthann Schillerues is not the same as "being OK." He has serious and life threatening conditions that will face him soon if he does not begin addressing his HIV/AIDS.  He agrees to sessions with Rene Kocheregina. I encouraged him to go in with open mind and learn something about himself he can apply to keeping him healthier.       RESOLVED: Cellulitis and abscess of left leg    Resolved       Homelessness (Chronic)    Working with Licensed conveyancerMitch for housing currently.       Human immunodeficiency virus (HIV) disease (HCC)    He continues to use his medications inconsistently and unwisely. He does not really have the room to advise to hold his treatment until he gets stable housing (barring this is the only reason...). Will change him to PI based regimen and start Symtuza today to offer better barrier to resistance while he works through other issues that affect compliance.   He will continue to work with [employer] for housing and retention to care.    Return in about 6 weeks (around 10/18/2018).       Relevant Medications   azithromycin (ZITHROMAX) 600 MG tablet   Darunavir-Cobicisctat-Emtricitabine-Tenofovir Alafenamide (SYMTUZA) 800-150-200-10 MG TABS      Rexene AlbertsStephanie , MSN, NP-C [facility] Pager: [phone] Office: [phone]  09/07/18  4:17 PM

## 2018-09-06 NOTE — Patient Instructions (Addendum)
Stop your Jacob Gutierrez - we are going to start you on a new HIV medication called Symtuza.  This is better to help prevent resistance with your virus.   For your immune protection I need you to take your bactrim one pill once a day. Azithromycin one pill twice a week (every 3 days).   Please work with Jacob Gutierrez - learn something about yourself. Dealing with things is a skill that everyone needs some talk therapy for.   Please come back in 6 weeks.

## 2018-09-07 DIAGNOSIS — F4389 Other reactions to severe stress: Secondary | ICD-10-CM | POA: Insufficient documentation

## 2018-09-07 DIAGNOSIS — F438 Other reactions to severe stress: Secondary | ICD-10-CM | POA: Insufficient documentation

## 2018-09-07 NOTE — Assessment & Plan Note (Signed)
Resolved

## 2018-09-07 NOTE — Assessment & Plan Note (Addendum)
Continue bactrim once a day. Re-trial of azithromycin but will separate dose to 600 mg once every 3 days instead of weekly dose d/t acutely elevated LFTs last time, that may be unrelated.

## 2018-09-07 NOTE — Assessment & Plan Note (Signed)
Working with [employer] for housing currently.

## 2018-09-07 NOTE — BH Specialist Note (Signed)
Integrated Behavioral Health Initial Visit  MRN: [id number] Name: Jacob Gutierrez  Number of Integrated Behavioral Health Clinician visits:: 1/6 Session Start time: 3:20pm  Session End time: 3:37 Total time: 15 minutes  Type of Service: Integrated Behavioral Health- Individual/Family Interpretor:No. Interpretor Name and Language: n/a   Warm Hand Off Completed.       SUBJECTIVE: Jacob Gutierrez is a 33 y.o. male accompanied by self Patient was referred by Dwain Sarna for depressive symptoms. Patient reports the following symptoms/concerns: irritability, mood swings, lack of interest in people and things, poor self-care Duration of problem: ongoing; Severity of problem: moderate  OBJECTIVE: Mood: Depressed and Affect: Depressed Risk of harm to self or others: No plan to harm self or others  LIFE CONTEXT: Patient is currently homeless and recently lost his job. He reports that he would like to find a place to live, but otherwise "The things that bother other people and are a big deal to them aren't to me." Patient indicates that he has little support.His mother has sometimes been supportive, but they often clash.  GOALS ADDRESSED: Patient will: 1. Reduce symptoms of: depression  INTERVENTIONS: Interventions utilized: Motivational Interviewing and Supportive Counseling   ASSESSMENT: Patient currently experiencing depressed mood, flat affect, irritability, mood instability, anhedonia, isolating and no desire to take care of himself. He has not been taking medications or eating regularly. The most appropriate diagnosis for his symptoms at this time is Major Depressive Disorder, Recurrent,  Moderate.   Counselor educated patient on counseling services available at [facility], including scope of practice, scheduling, and financial accessibility. Patient and counselor discussed how counseling might be helpful for patient. Patient states that there is no problem, nothing to talk about and  that he does not believe counseling will be helpful. However, several people in his life have told him that he needs to seek counseling, and he has agreed to 5 sessions to please his bridge counselor. Counselor explained the process of counseling, and guided patient to identify goals for his sessions. Patient states he has no goal, other than to be able to say to the people in his life that he tried counseling.   Patient may benefit from 5 sessions of Reality Therapy.  PLAN: 1. Counselor will schedule sessions with patient's [employer], who provides transportation.  Angus Palms, LCSW

## 2018-09-07 NOTE — Assessment & Plan Note (Signed)
I spent a lot of time discussing Jacob Gutierrez and my recommendation for talk therapy sessions with Rene Kocher. I explained to him that not dealing with issues is not the same as "being OK." He has serious and life threatening conditions that will face him soon if he does not begin addressing his HIV/AIDS.  He agrees to sessions with Rene Kocher. I encouraged him to go in with open mind and learn something about himself he can apply to keeping him healthier.

## 2018-09-07 NOTE — Assessment & Plan Note (Signed)
He continues to use his medications inconsistently and unwisely. He does not really have the room to advise to hold his treatment until he gets stable housing (barring this is the only reason...). Will change him to PI based regimen and start Symtuza today to offer better barrier to resistance while he works through other issues that affect compliance.   He will continue to work with [employer] for housing and retention to care.  Return in about 6 weeks (around 10/18/2018).

## 2018-09-19 ENCOUNTER — Other Ambulatory Visit: Payer: Self-pay

## 2018-09-19 ENCOUNTER — Encounter (HOSPITAL_COMMUNITY): Payer: Self-pay

## 2018-09-19 ENCOUNTER — Emergency Department (HOSPITAL_COMMUNITY): Payer: Medicaid Other

## 2018-09-19 ENCOUNTER — Inpatient Hospital Stay (HOSPITAL_COMMUNITY)
Admission: EM | Admit: 2018-09-19 | Discharge: 2018-09-22 | DRG: 866 | Disposition: A | Discharge status: Home / Self Care | Payer: Medicaid Other | Attending: Internal Medicine | Admitting: Internal Medicine

## 2018-09-19 DIAGNOSIS — Z880 Allergy status to penicillin: Secondary | ICD-10-CM

## 2018-09-19 DIAGNOSIS — Z8614 Personal history of Methicillin resistant Staphylococcus aureus infection: Secondary | ICD-10-CM

## 2018-09-19 DIAGNOSIS — G030 Nonpyogenic meningitis: Secondary | ICD-10-CM | POA: Diagnosis present

## 2018-09-19 DIAGNOSIS — F1290 Cannabis use, unspecified, uncomplicated: Secondary | ICD-10-CM | POA: Diagnosis present

## 2018-09-19 DIAGNOSIS — B349 Viral infection, unspecified: Principal | ICD-10-CM | POA: Diagnosis present

## 2018-09-19 DIAGNOSIS — R519 Headache, unspecified: Secondary | ICD-10-CM

## 2018-09-19 DIAGNOSIS — D61818 Other pancytopenia: Secondary | ICD-10-CM | POA: Diagnosis present

## 2018-09-19 DIAGNOSIS — F17210 Nicotine dependence, cigarettes, uncomplicated: Secondary | ICD-10-CM | POA: Diagnosis present

## 2018-09-19 DIAGNOSIS — Z9079 Acquired absence of other genital organ(s): Secondary | ICD-10-CM

## 2018-09-19 DIAGNOSIS — D72819 Decreased white blood cell count, unspecified: Secondary | ICD-10-CM | POA: Diagnosis present

## 2018-09-19 DIAGNOSIS — Z9119 Patient's noncompliance with other medical treatment and regimen: Secondary | ICD-10-CM

## 2018-09-19 DIAGNOSIS — R740 Nonspecific elevation of levels of transaminase and lactic acid dehydrogenase [LDH]: Secondary | ICD-10-CM | POA: Diagnosis present

## 2018-09-19 DIAGNOSIS — R509 Fever, unspecified: Secondary | ICD-10-CM

## 2018-09-19 DIAGNOSIS — R51 Headache: Secondary | ICD-10-CM

## 2018-09-19 DIAGNOSIS — B20 Human immunodeficiency virus [HIV] disease: Secondary | ICD-10-CM | POA: Diagnosis present

## 2018-09-19 DIAGNOSIS — Z79899 Other long term (current) drug therapy: Secondary | ICD-10-CM

## 2018-09-19 DIAGNOSIS — Z881 Allergy status to other antibiotic agents status: Secondary | ICD-10-CM

## 2018-09-19 DIAGNOSIS — D696 Thrombocytopenia, unspecified: Secondary | ICD-10-CM | POA: Diagnosis present

## 2018-09-19 DIAGNOSIS — E871 Hypo-osmolality and hyponatremia: Secondary | ICD-10-CM | POA: Diagnosis present

## 2018-09-19 DIAGNOSIS — G039 Meningitis, unspecified: Secondary | ICD-10-CM

## 2018-09-19 DIAGNOSIS — Z8619 Personal history of other infectious and parasitic diseases: Secondary | ICD-10-CM

## 2018-09-19 LAB — CBC WITH DIFFERENTIAL/PLATELET
ABS IMMATURE GRANULOCYTES: 0.09 10*3/uL — AB (ref 0.00–0.07)
Basophils Absolute: 0 10*3/uL (ref 0.0–0.1)
Basophils Relative: 0 %
Eosinophils Absolute: 0.2 10*3/uL (ref 0.0–0.5)
Eosinophils Relative: 6 %
HCT: 34 % — ABNORMAL LOW (ref 39.0–52.0)
HEMOGLOBIN: 11 g/dL — AB (ref 13.0–17.0)
IMMATURE GRANULOCYTES: 3 %
Lymphocytes Relative: 14 %
Lymphs Abs: 0.4 10*3/uL — ABNORMAL LOW (ref 0.7–4.0)
MCH: 31.2 pg (ref 26.0–34.0)
MCHC: 32.4 g/dL (ref 30.0–36.0)
MCV: 96.3 fL (ref 80.0–100.0)
Monocytes Absolute: 0.5 10*3/uL (ref 0.1–1.0)
Monocytes Relative: 17 %
NEUTROS PCT: 60 %
Neutro Abs: 1.7 10*3/uL (ref 1.7–7.7)
Platelets: 142 10*3/uL — ABNORMAL LOW (ref 150–400)
RBC: 3.53 MIL/uL — ABNORMAL LOW (ref 4.22–5.81)
RDW: 16.3 % — ABNORMAL HIGH (ref 11.5–15.5)
WBC: 2.8 10*3/uL — ABNORMAL LOW (ref 4.0–10.5)
nRBC: 0 % (ref 0.0–0.2)

## 2018-09-19 LAB — COMPREHENSIVE METABOLIC PANEL
ALT: 49 U/L — ABNORMAL HIGH (ref 0–44)
ANION GAP: 8 (ref 5–15)
AST: 47 U/L — ABNORMAL HIGH (ref 15–41)
Albumin: 3.5 g/dL (ref 3.5–5.0)
Alkaline Phosphatase: 66 U/L (ref 38–126)
BUN: 15 mg/dL (ref 6–20)
CHLORIDE: 102 mmol/L (ref 98–111)
CO2: 23 mmol/L (ref 22–32)
Calcium: 8 mg/dL — ABNORMAL LOW (ref 8.9–10.3)
Creatinine, Ser: 0.65 mg/dL (ref 0.61–1.24)
GFR calc Af Amer: >60 mL/min (ref >60)
GFR calc non Af Amer: >60 mL/min (ref >60)
Glucose, Bld: 88 mg/dL (ref 70–99)
Potassium: 3.5 mmol/L (ref 3.5–5.1)
Sodium: 133 mmol/L — ABNORMAL LOW (ref 135–145)
Total Bilirubin: 0.3 mg/dL (ref 0.3–1.2)
Total Protein: 7.6 g/dL (ref 6.5–8.1)

## 2018-09-19 LAB — URINALYSIS, ROUTINE W REFLEX MICROSCOPIC
Bilirubin Urine: NEGATIVE
Glucose, UA: NEGATIVE mg/dL
Hgb urine dipstick: NEGATIVE
Ketones, ur: NEGATIVE mg/dL
LEUKOCYTE UA: NEGATIVE
Nitrite: NEGATIVE
Protein, ur: NEGATIVE mg/dL
SPECIFIC GRAVITY, URINE: 1.02 (ref 1.005–1.030)
pH: 6 (ref 5.0–8.0)

## 2018-09-19 NOTE — ED Triage Notes (Signed)
Pt arrives POV for eval of HA x 3 days w/ sinus pressure, "hot feeling in face", runny nose and general malaise. Pt also reports fevers. Denies sick contacts, states is homeless currently.

## 2018-09-20 ENCOUNTER — Emergency Department (HOSPITAL_COMMUNITY): Payer: Medicaid Other

## 2018-09-20 ENCOUNTER — Encounter (HOSPITAL_COMMUNITY): Payer: Self-pay | Admitting: Family Medicine

## 2018-09-20 ENCOUNTER — Inpatient Hospital Stay (HOSPITAL_COMMUNITY): Payer: Medicaid Other

## 2018-09-20 DIAGNOSIS — E871 Hypo-osmolality and hyponatremia: Secondary | ICD-10-CM | POA: Diagnosis present

## 2018-09-20 DIAGNOSIS — D696 Thrombocytopenia, unspecified: Secondary | ICD-10-CM | POA: Diagnosis present

## 2018-09-20 DIAGNOSIS — G030 Nonpyogenic meningitis: Secondary | ICD-10-CM | POA: Diagnosis present

## 2018-09-20 DIAGNOSIS — F17210 Nicotine dependence, cigarettes, uncomplicated: Secondary | ICD-10-CM | POA: Diagnosis present

## 2018-09-20 DIAGNOSIS — B20 Human immunodeficiency virus [HIV] disease: Secondary | ICD-10-CM | POA: Diagnosis not present

## 2018-09-20 DIAGNOSIS — Z880 Allergy status to penicillin: Secondary | ICD-10-CM | POA: Diagnosis not present

## 2018-09-20 DIAGNOSIS — D72819 Decreased white blood cell count, unspecified: Secondary | ICD-10-CM | POA: Diagnosis present

## 2018-09-20 DIAGNOSIS — R51 Headache: Secondary | ICD-10-CM

## 2018-09-20 DIAGNOSIS — Z8614 Personal history of Methicillin resistant Staphylococcus aureus infection: Secondary | ICD-10-CM | POA: Diagnosis not present

## 2018-09-20 DIAGNOSIS — Z8619 Personal history of other infectious and parasitic diseases: Secondary | ICD-10-CM | POA: Diagnosis not present

## 2018-09-20 DIAGNOSIS — R509 Fever, unspecified: Secondary | ICD-10-CM

## 2018-09-20 DIAGNOSIS — G039 Meningitis, unspecified: Secondary | ICD-10-CM | POA: Diagnosis not present

## 2018-09-20 DIAGNOSIS — F1290 Cannabis use, unspecified, uncomplicated: Secondary | ICD-10-CM | POA: Diagnosis present

## 2018-09-20 DIAGNOSIS — Z9119 Patient's noncompliance with other medical treatment and regimen: Secondary | ICD-10-CM | POA: Diagnosis not present

## 2018-09-20 DIAGNOSIS — Z881 Allergy status to other antibiotic agents status: Secondary | ICD-10-CM | POA: Diagnosis not present

## 2018-09-20 DIAGNOSIS — R740 Nonspecific elevation of levels of transaminase and lactic acid dehydrogenase [LDH]: Secondary | ICD-10-CM | POA: Diagnosis present

## 2018-09-20 DIAGNOSIS — Z79899 Other long term (current) drug therapy: Secondary | ICD-10-CM | POA: Diagnosis not present

## 2018-09-20 DIAGNOSIS — D61818 Other pancytopenia: Secondary | ICD-10-CM | POA: Diagnosis present

## 2018-09-20 DIAGNOSIS — Z9079 Acquired absence of other genital organ(s): Secondary | ICD-10-CM | POA: Diagnosis not present

## 2018-09-20 DIAGNOSIS — B349 Viral infection, unspecified: Secondary | ICD-10-CM | POA: Diagnosis not present

## 2018-09-20 LAB — CBC WITH DIFFERENTIAL/PLATELET
BLASTS: 0 %
Band Neutrophils: 0 %
Basophils Absolute: 0 10*3/uL (ref 0.0–0.1)
Basophils Relative: 0 %
Eosinophils Absolute: 0.1 10*3/uL (ref 0.0–0.5)
Eosinophils Relative: 5 %
HCT: 34.2 % — ABNORMAL LOW (ref 39.0–52.0)
Hemoglobin: 11.1 g/dL — ABNORMAL LOW (ref 13.0–17.0)
LYMPHS PCT: 15 %
Lymphs Abs: 0.4 10*3/uL — ABNORMAL LOW (ref 0.7–4.0)
MCH: 32 pg (ref 26.0–34.0)
MCHC: 32.5 g/dL (ref 30.0–36.0)
MCV: 98.6 fL (ref 80.0–100.0)
Metamyelocytes Relative: 0 %
Monocytes Absolute: 0.5 10*3/uL (ref 0.1–1.0)
Monocytes Relative: 21 %
Myelocytes: 0 %
NRBC: 0 % (ref 0.0–0.2)
Neutro Abs: 1.6 10*3/uL — ABNORMAL LOW (ref 1.7–7.7)
Neutrophils Relative %: 59 %
OTHER: 0 %
Platelets: 148 10*3/uL — ABNORMAL LOW (ref 150–400)
Promyelocytes Relative: 0 %
RBC: 3.47 MIL/uL — ABNORMAL LOW (ref 4.22–5.81)
RDW: 16.4 % — ABNORMAL HIGH (ref 11.5–15.5)
WBC: 2.6 10*3/uL — ABNORMAL LOW (ref 4.0–10.5)
nRBC: 0 /100 WBC

## 2018-09-20 LAB — CSF CELL COUNT WITH DIFFERENTIAL
RBC Count, CSF: 0 /mm3
Tube #: 3
WBC, CSF: 4 /mm3 (ref 0–5)

## 2018-09-20 LAB — BASIC METABOLIC PANEL
ANION GAP: 8 (ref 5–15)
BUN: 14 mg/dL (ref 6–20)
CO2: 27 mmol/L (ref 22–32)
Calcium: 8 mg/dL — ABNORMAL LOW (ref 8.9–10.3)
Chloride: 99 mmol/L (ref 98–111)
Creatinine, Ser: 0.67 mg/dL (ref 0.61–1.24)
GFR calc Af Amer: >60 mL/min (ref >60)
GFR calc non Af Amer: >60 mL/min (ref >60)
Glucose, Bld: 85 mg/dL (ref 70–99)
Potassium: 3.7 mmol/L (ref 3.5–5.1)
Sodium: 134 mmol/L — ABNORMAL LOW (ref 135–145)

## 2018-09-20 LAB — GLUCOSE, CSF: Glucose, CSF: 43 mg/dL (ref 40–70)

## 2018-09-20 LAB — HEPATIC FUNCTION PANEL
ALBUMIN: 3.4 g/dL — AB (ref 3.5–5.0)
ALT: 52 U/L — ABNORMAL HIGH (ref 0–44)
AST: 53 U/L — ABNORMAL HIGH (ref 15–41)
Alkaline Phosphatase: 66 U/L (ref 38–126)
Bilirubin, Direct: 0.2 mg/dL (ref 0.0–0.2)
Indirect Bilirubin: 0.5 mg/dL (ref 0.3–0.9)
TOTAL PROTEIN: 7.9 g/dL (ref 6.5–8.1)
Total Bilirubin: 0.7 mg/dL (ref 0.3–1.2)

## 2018-09-20 LAB — CRYPTOCOCCAL ANTIGEN: Crypto Ag: NEGATIVE

## 2018-09-20 LAB — MRSA PCR SCREENING: MRSA by PCR: NEGATIVE

## 2018-09-20 LAB — LACTIC ACID, PLASMA: Lactic Acid, Venous: 1.4 mmol/L (ref 0.5–1.9)

## 2018-09-20 LAB — CRYPTOCOCCAL ANTIGEN, CSF: Crypto Ag: NEGATIVE

## 2018-09-20 LAB — INFLUENZA PANEL BY PCR (TYPE A & B)
Influenza A By PCR: NEGATIVE
Influenza B By PCR: NEGATIVE

## 2018-09-20 LAB — PROTEIN, CSF: Total  Protein, CSF: 90 mg/dL — ABNORMAL HIGH (ref 15–45)

## 2018-09-20 LAB — MAGNESIUM: Magnesium: 2.3 mg/dL (ref 1.7–2.4)

## 2018-09-20 MED ORDER — ACETAMINOPHEN 325 MG PO TABS
650.0000 mg | ORAL_TABLET | Freq: Once | ORAL | Status: AC
  Administered 2018-09-20: 650 mg via ORAL
  Filled 2018-09-20: qty 2

## 2018-09-20 MED ORDER — DIPHENHYDRAMINE HCL 50 MG/ML IJ SOLN: 25.0000 mg | Freq: Every day | INTRAMUSCULAR | Status: DC | PRN

## 2018-09-20 MED ORDER — FLUCYTOSINE 250 MG PO CAPS
25.0000 mg/kg | ORAL_CAPSULE | Freq: Four times a day (QID) | ORAL | Status: DC
  Administered 2018-09-20 – 2018-09-21 (×4): 2250 mg via ORAL
  Filled 2018-09-20 (×6): qty 9

## 2018-09-20 MED ORDER — ACETAMINOPHEN 325 MG PO TABS: 650.0000 mg | ORAL_TABLET | Freq: Every day | ORAL | Status: DC | PRN

## 2018-09-20 MED ORDER — SULFAMETHOXAZOLE-TRIMETHOPRIM 400-80 MG PO TABS
1.0000 | ORAL_TABLET | Freq: Every day | ORAL | Status: DC
  Administered 2018-09-20 – 2018-09-22 (×3): 1 via ORAL
  Filled 2018-09-20 (×3): qty 1

## 2018-09-20 MED ORDER — ACETAMINOPHEN 325 MG PO TABS: 650.0000 mg | ORAL_TABLET | Freq: Four times a day (QID) | ORAL | Status: DC | PRN

## 2018-09-20 MED ORDER — SODIUM CHLORIDE 0.9 % IV SOLN
2.0000 g | Freq: Once | INTRAVENOUS | Status: AC
  Administered 2018-09-20: 2 g via INTRAVENOUS
  Filled 2018-09-20: qty 20

## 2018-09-20 MED ORDER — SODIUM CHLORIDE 0.9 % IV BOLUS FOR AMBISOME
500.0000 mL | INTRAVENOUS | Status: DC
  Administered 2018-09-20 – 2018-09-21 (×2): 500 mL via INTRAVENOUS

## 2018-09-20 MED ORDER — HYDROCODONE-ACETAMINOPHEN 5-325 MG PO TABS
1.0000 | ORAL_TABLET | ORAL | Status: DC | PRN
  Administered 2018-09-20 (×2): 1 via ORAL
  Filled 2018-09-20 (×2): qty 1

## 2018-09-20 MED ORDER — DEXTROSE 5 % IV SOLN
4.0000 mg/kg | INTRAVENOUS | Status: DC
  Administered 2018-09-20 – 2018-09-21 (×2): 340 mg via INTRAVENOUS
  Filled 2018-09-20 (×2): qty 340

## 2018-09-20 MED ORDER — SODIUM CHLORIDE 0.9 % IV SOLN: INTRAVENOUS | Status: AC

## 2018-09-20 MED ORDER — POLYETHYLENE GLYCOL 3350 17 G PO PACK: 17.0000 g | PACK | Freq: Every day | ORAL | Status: DC | PRN

## 2018-09-20 MED ORDER — MEPERIDINE HCL 25 MG/ML IJ SOLN: 25.0000 mg | INTRAMUSCULAR | Status: DC | PRN

## 2018-09-20 MED ORDER — DEXTROSE 5% FOR FLUSHING BEFORE AND AFTER AMBISOME
10.0000 mL | INTRAVENOUS | Status: DC
  Administered 2018-09-20 – 2018-09-21 (×2): 10 mL via INTRAVENOUS
  Filled 2018-09-20 (×2): qty 50

## 2018-09-20 MED ORDER — ONDANSETRON HCL 4 MG PO TABS: 4.0000 mg | ORAL_TABLET | Freq: Four times a day (QID) | ORAL | Status: DC | PRN

## 2018-09-20 MED ORDER — DARUN-COBIC-EMTRICIT-TENOFAF 800-150-200-10 MG PO TABS
1.0000 | ORAL_TABLET | Freq: Every day | ORAL | Status: DC
  Filled 2018-09-20: qty 1

## 2018-09-20 MED ORDER — DIPHENHYDRAMINE HCL 25 MG PO CAPS: 25.0000 mg | ORAL_CAPSULE | Freq: Every day | ORAL | Status: DC | PRN

## 2018-09-20 MED ORDER — LIDOCAINE HCL (PF) 1 % IJ SOLN
5.0000 mL | Freq: Once | INTRAMUSCULAR | Status: AC
  Administered 2018-09-20: 5 mL via INTRADERMAL

## 2018-09-20 MED ORDER — ONDANSETRON 4 MG PO TBDP
4.0000 mg | ORAL_TABLET | Freq: Once | ORAL | Status: AC
  Administered 2018-09-20: 4 mg via ORAL
  Filled 2018-09-20: qty 1

## 2018-09-20 MED ORDER — SODIUM CHLORIDE 0.9 % IV BOLUS: 1000.0000 mL | Freq: Once | INTRAVENOUS | Status: DC

## 2018-09-20 MED ORDER — ACETAMINOPHEN 650 MG RE SUPP: 650.0000 mg | Freq: Four times a day (QID) | RECTAL | Status: DC | PRN

## 2018-09-20 MED ORDER — KETOROLAC TROMETHAMINE 15 MG/ML IJ SOLN: 15.0000 mg | Freq: Four times a day (QID) | INTRAMUSCULAR | Status: DC | PRN

## 2018-09-20 MED ORDER — LINEZOLID 600 MG/300ML IV SOLN
600.0000 mg | Freq: Once | INTRAVENOUS | Status: AC
  Administered 2018-09-20: 600 mg via INTRAVENOUS
  Filled 2018-09-20: qty 300

## 2018-09-20 MED ORDER — SODIUM CHLORIDE 0.9 % IV SOLN
2.0000 g | Freq: Two times a day (BID) | INTRAVENOUS | Status: DC
  Filled 2018-09-20: qty 20

## 2018-09-20 MED ORDER — ONDANSETRON HCL 4 MG/2ML IJ SOLN: 4.0000 mg | Freq: Four times a day (QID) | INTRAMUSCULAR | Status: DC | PRN

## 2018-09-20 NOTE — Progress Notes (Signed)
Pharmacy Antibiotic Note  Jacob Gutierrez is a 33 y.o. 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 Patient is expected to have LP today.   Pharmacy has been consulted for amphotericin B dosing. Scr this morning was 0.67 , K-3.7 and Mg- 2.3. WBC count 2.6.   Plan: Amphotericin B 340 mg (~ 4 mg/kg) every 24 hours  Give 500 mL of normal saline 1 hour prior to infusion of amphotericin B Flush line with 10 mL of dextrose Administer amphotericin B Flush line with 10 mL of dextrose  Give 500 mL of normal saline immediately after end of amphotericin B infusion and dextrose flush Pharmacy will monitor BMET and Mg every day and replace lytes Monitor BMET and CBC  Hold Symtuza for now    Height: 6\' 1"  (185.4 cm) Weight: 189 lb 2.5 oz (85.8 kg) IBW/kg (Calculated) : 79.9  Temp (24hrs), Avg:100.8 F (38.2 C), Min:99.2 F (37.3 C), Max:102.3 F (39.1 C)  Recent Labs  Lab 09/19/18 2202 09/20/18 0049 09/20/18 0312 09/20/18 0332  WBC 2.8*  --   --  2.6*  CREATININE 0.65  --  0.67  --   LATICACIDVEN  --  1.4  --   --     Estimated Creatinine Clearance: 145.7 mL/min (by C-G formula based on SCr of 0.67 mg/dL).    Allergies  Allergen Reactions  . Amoxicillin Other (See Comments)    From childhood: "I had a reaction when i was little." (??)  . Vancomycin Itching    Angioedema      Thank you for allowing pharmacy to be a part of this patient's care.  Sharin Mons, PharmD, BCPS, BCIDP Infectious Diseases Clinical Pharmacist Phone: [phone] 09/20/2018 11:58 AM

## 2018-09-20 NOTE — ED Notes (Signed)
Report given to 5N RN. All questions answered 

## 2018-09-20 NOTE — Consult Note (Signed)
[facility] for Infectious Disease    Date of Admission:  09/19/2018     Total days of antibiotics 1   L-ampho   Flucytosine   Linezolid  Ceftriaxone               Reason for Consult: headache, fever, HIV/AIDS    Referring Provider: Oneida Arenas Primary Care Provider: Kerin Perna, NP   Assessment: Jacob Gutierrez is a 33 y.o. male with advanced HIV/AIDS with chronically < 100 CD4 and intermittently maintained on ART. I saw Jacob Gutierrez in clinic a few weeks ago and switched him to [place] which he recently started taking regularly. With current complaints and concerns we will stop his Symtuza now until we can more definitively rule out cryptococcal meningitis with LP and continue L-ampho and flucytosine; considering his poor immune system there is a chance his wbc count may be low - follow CSF titer along with other indices/cultures. Opening pressure is normal @ 18 cm H20. Gram stain is negative with culture pending. Blood cultures are pending. Brain CT suggests focal encephalomacia in the left parietal convexity c/w old infarct w/o mass effect. He does not have features of meningitis or encephalitis on exam. Still with mild frontal headache but improved. Would stop ceftriaxone and linezolid and observe. Other consideration for his fevers could be re-introduction of consistent ART recently as he has no other signs of infection on exam.  For prophylaxis - please continue bactrim 1 DS tab QD. We recently resumed azithromycin 600 mg 2x/week however with his LFTs are up again for the second time in attempt to start MAC proph so will stop this.    Plan: 1. Continue antifungal coverage until CSF crytpcoccal Ag returns 2. Stop linezolid and ceftriaxone 3. Follow micro data 4. Follow temp trends and symptoms  Principal Problem:   Meningitis Active Problems:   AIDS (acquired immune deficiency syndrome) (HCC)   Headache   Fever   Leukopenia   Thrombocytopenia (HCC)  Hyponatremia   . Darunavir-Cobicisctat-Emtricitabine-Tenofovir Alafenamide  1 tablet Oral Q breakfast  . dextrose  10 mL Intravenous Q24H  . dextrose  10 mL Intravenous Q24H  . flucytosine  25 mg/kg Oral Q6H  . sodium chloride  500 mL Intravenous Q24H  . sodium chloride  500 mL Intravenous Q24H  . sulfamethoxazole-trimethoprim  1 tablet Oral Daily    HPI: Jacob Gutierrez is a 33 y.o. male with advanced HIV/AIDS (CD4 10, VL 1.22 million copies) with longstanding non-compliance. 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 He also has a history of ESBL e coli urinary infections, dysplasia of anus, cryptosporidium infection, coccidioidosis of the lungs.   Jacob Gutierrez presented with 3d history of worsening headache, fever/chills. He also reports associated sinus pressure, nausea, photophobia at one point as well. Work up revealed tachycardia, fever to 102.5 F, normal CXR, flu pcr negative. Leukopenia (unchanged). NCHCT was negative for acute process and clear sinuses. LP is currently pending, however his serum Cryptococcal antigen titer is negative which is reassuring.   He has been traveling with a friend recently on truck up and down [employer] from [place] to [place] of [place]. No GI or GU complaints. No respiratory complaints. No sick contacts from his recollection.   Review of Systems  Constitutional: Positive for chills and fever.  HENT: Negative for tinnitus.  Eyes: Positive for photophobia. Negative for blurred vision.  Respiratory: Negative for cough and sputum production.   Cardiovascular: Negative for chest pain.  Gastrointestinal: Negative for diarrhea, nausea and vomiting.  Genitourinary: Negative for dysuria.  Skin: Negative for rash.  Neurological: Positive for dizziness and headaches.    Past Medical History:  Diagnosis Date  . AIDS (acquired  immune deficiency syndrome) ([place])   . AIDS (acquired immune deficiency syndrome) ([place])   . 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 ([place])   . H/O coccidioidomycosis    pulmonary   . HIV disease ([place])   . Past history of allergy to penicillin-type antibiotic 07-03-2012   desensitization  . PNA (pneumonia)   . Shigella gastroenteritis   . Syphilis    history /treated     Social History   Tobacco Use  . Smoking status: Current Every Day Smoker    Packs/day: 0.50    Types: Cigarettes  . Smokeless tobacco: Never Used  . Tobacco comment: cutting back  Substance Use Topics  . Alcohol use: Yes    Alcohol/week: 1.0 standard drinks    Types: 1 Standard drinks or equivalent per week    Comment: whiskey occasionally   . Drug use: Yes    Frequency: 1.0 times per week    Types: Marijuana    Family History  Problem Relation Age of Onset  . Hypertension Mother   . Lupus Maternal Grandmother   . Cancer Paternal Grandfather    Allergies  Allergen Reactions  . Amoxicillin Other (See Comments)    From childhood: "I had a reaction when i was little." (??)  . Vancomycin Itching    Angioedema    OBJECTIVE: Blood pressure 99/64, pulse 84, temperature 99.2 F (37.3 C), temperature source Oral, resp. rate 18, height _0  (1.854 m), weight 85.8 kg, SpO2 98 %.  Physical Exam Vitals signs reviewed.  Constitutional:      Appearance: He is well-developed. He is ill-appearing. He is not toxic-appearing.  HENT:     Mouth/Throat:     Mouth: Mucous membranes are moist.     Pharynx: Oropharynx is clear.  Eyes:     Extraocular Movements: Extraocular movements intact.     Pupils: Pupils are equal, round, and reactive to light.  Neck:     Musculoskeletal: Normal range of motion and neck supple. No neck rigidity.  Cardiovascular:     Rate and Rhythm: Normal rate and regular rhythm.     Heart sounds: Normal heart sounds. No murmur.  Pulmonary:     Effort:  Pulmonary effort is normal.     Breath sounds: Normal breath sounds. No wheezing, rhonchi or rales.  Abdominal:     General: Bowel sounds are normal. There is no distension.  Musculoskeletal: Normal range of motion.  Lymphadenopathy:     Cervical: No cervical adenopathy.  Skin:    General: Skin is warm and dry.     Capillary Refill: Capillary refill takes less than 2 seconds.  Neurological:     Mental Status: He is alert.    Lab Results Lab Results  Component Value Date   WBC 2.6 (L) 09/20/2018   HGB 11.1 (L) 09/20/2018   HCT 34.2 (L) 09/20/2018   MCV 98.6 09/20/2018   PLT 148 (L) 09/20/2018    Lab Results  Component Value Date   CREATININE 0.67 09/20/2018   BUN 14 09/20/2018   NA 134 (L) 09/20/2018   K 3.7 09/20/2018   CL 99  09/20/2018   CO2 27 09/20/2018    Lab Results  Component Value Date   ALT 52 (H) 09/20/2018   AST 53 (H) 09/20/2018   ALKPHOS 66 09/20/2018   BILITOT 0.7 09/20/2018     Microbiology: No results found for this or any previous visit (from the past 240 hour(s)).  Janene Madeira, MSN, NP-C [facility] Pager: [phone]  09/20/2018 11:51 AM

## 2018-09-20 NOTE — ED Notes (Signed)
Per Dr Antionette Char OK to go to floor and have IV team meet him there. They spent great deal of time attempting to get one which is already occluded.

## 2018-09-20 NOTE — Progress Notes (Signed)
[facility] PROGRESS NOTE    Progress Note  Jacob Gutierrez  EXB:[id number] DOB: [birth year] DOA: 09/19/2018 PCP: Grayce Sessions, NP     Brief Narrative:   Jacob Gutierrez is an 33 y.o. male past medical history of HIV with a CD4 count of 10 viral load of 1.2 million on December presents to the emergency room for evaluation of headache and fever reports that it started several days before admission, he started having also photophobia in the ED, in the ED he was found to have a temperature tachycardic, influenza negative UA and chest x-ray were unremarkable.  CBC showed mild leukopenia, CT head negative for any intrcranial abnormalities patient refused LP in the ED blood cultures were collected he was started on amphotericin, Rocephin fluoxetine and will He was given 2 L of normal saline infectious disease was consulted by the ED physician  Assessment/Plan:   Headache and fever possibly due to Meningitis: No evidence of infection and chest x-ray UA influenza PCR is negative. He has no focal neurological deficits, he refused LP by ED, IR to perform LP. ID was consulted they recommended amphotericin B, fluoxetine Rocephin and less light.  Awaiting further recommendations.  AIDS (acquired immune deficiency syndrome) (HCC) CD4 count 10, with a viral load 1.2 million. Continues symtuza and Bactrim.  Hyponatremia: Treated with 2 L normal saline, likely due to hypovolemia improved.  Leukopenia Appears to be chronic likely related to HIV infection.  Elevated transaminases:   DVT prophylaxis: SCD Family Communication:none Disposition Plan/Barrier to D/C: unable to determine Code Status:     Code Status Orders  (From admission, onward)         Start     Ordered   09/20/18 0318  Full code  Continuous     09/20/18 0318        Code Status History    Date Active Date Inactive Code Status Order ID Comments User Context   07/19/2018 0022 07/24/2018 1940 Full Code  [id number]  Hillary Bow, DO ED   06/09/2018 1001 06/12/2018 1837 Full Code [id number]  Levora Dredge, MD ED   04/27/2018 0231 05/01/2018 1850 Full Code [id number]  Eduard Clos, MD ED   12/10/2017 0023 12/15/2017 1926 Full Code [id number]  Hillary Bow, DO Inpatient   03/05/2017 2206 03/08/2017 2104 Full Code [id number]  Beaulah Dinning, MD Inpatient        IV Access:    Peripheral IV   Procedures and diagnostic studies:   Dg Chest 2 View  Result Date: 09/19/2018 CLINICAL DATA:  33 year old male with fever. EXAM: CHEST - 2 VIEW COMPARISON:  Chest radiograph dated 04/26/2018 FINDINGS: The heart size and mediastinal contours are within normal limits. Both lungs are clear. The visualized skeletal structures are unremarkable. IMPRESSION: No active cardiopulmonary disease. Electronically Signed   By: Elgie Collard M.D.   On: 09/19/2018 22:34   Ct Head Wo Contrast  Result Date: 09/20/2018 CLINICAL DATA:  Headache for 3 days. Sinus pressure, photophobia, nausea, runny nose, malaise, fevers. History of cryptococcal infection, syphilis, HIV aids. EXAM: CT HEAD WITHOUT CONTRAST TECHNIQUE: Contiguous axial images were obtained from the base of the skull through the vertex without intravenous contrast. COMPARISON:  04/26/2018 FINDINGS: Brain: Focal encephalomalacia in the left parietal convexity consistent with old infarct and unchanged since prior study. No mass-effect or midline shift. No abnormal extra-axial fluid collections. Gray-white matter junctions are distinct. Basal cisterns are not effaced. No ventricular dilatation. No acute intracranial hemorrhage. Vascular: No  hyperdense vessel or unexpected calcification. Skull: Calvarium appears intact. Sinuses/Orbits: Paranasal sinuses and mastoid air cells are clear. Other: No significant changes since previous study. IMPRESSION: No acute intracranial abnormalities. Old left parietal infarct. Electronically Signed   By: Burman Nieves  M.D.   On: 09/20/2018 02:18     Medical Consultants:    None.  Anti-Infectives:   IV Rocephin, LInezolid and flucytosine Oral bactyrim  Subjective:    Jacob Gutierrez patient in a bad mood with offensive language, he wants the LP done by ultrasound-guided.  Objective:    Vitals:   09/20/18 0353 09/20/18 0400 09/20/18 0456 09/20/18 0700  BP: 99/64 92/62 99/64    Pulse:   84   Resp:      Temp:    99.2 F (37.3 C)  TempSrc:    Oral  SpO2:   98%   Weight:      Height:        Intake/Output Summary (Last 24 hours) at 09/20/2018 0925 Last data filed at 09/20/2018 0602 Gross per 24 hour  Intake 784.72 ml  Output -  Net 784.72 ml   Filed Weights   09/19/18 2153 09/19/18 2158  Weight: 85.7 kg 85.8 kg    Exam: General exam: In no acute distress. Respiratory system: Good air movement and clear to auscultation. Cardiovascular system: S1 & S2 heard, RRR.  Gastrointestinal system: Abdomen is nondistended, soft and nontender.  Central nervous system: Alert and oriented. No focal neurological deficits. Extremities: No pedal edema. Skin: No rashes, lesions or ulcers   Data Reviewed:    Labs: Basic Metabolic Panel: Recent Labs  Lab 09/19/18 2202 09/20/18 0312  NA 133* 134*  K 3.5 3.7  CL 102 99  CO2 23 27  GLUCOSE 88 85  BUN 15 14  CREATININE 0.65 0.67  CALCIUM 8.0* 8.0*  MG  --  2.3   GFR Estimated Creatinine Clearance: 145.7 mL/min (by C-G formula based on SCr of 0.67 mg/dL). Liver Function Tests: Recent Labs  Lab 09/19/18 2202 09/20/18 0312  AST 47* 53*  ALT 49* 52*  ALKPHOS 66 66  BILITOT 0.3 0.7  PROT 7.6 7.9  ALBUMIN 3.5 3.4*   No results for input(s): LIPASE, AMYLASE in the last 168 hours. No results for input(s): AMMONIA in the last 168 hours. Coagulation profile No results for input(s): INR, PROTIME in the last 168 hours.  CBC: Recent Labs  Lab 09/19/18 2202 09/20/18 0332  WBC 2.8* 2.6*  NEUTROABS 1.7 1.6*  HGB 11.0* 11.1*    HCT 34.0* 34.2*  MCV 96.3 98.6  PLT 142* 148*   Cardiac Enzymes: No results for input(s): CKTOTAL, CKMB, CKMBINDEX, TROPONINI in the last 168 hours. BNP (last 3 results) No results for input(s): PROBNP in the last 8760 hours. CBG: No results for input(s): GLUCAP in the last 168 hours. D-Dimer: No results for input(s): DDIMER in the last 72 hours. Hgb A1c: No results for input(s): HGBA1C in the last 72 hours. Lipid Profile: No results for input(s): CHOL, HDL, LDLCALC, TRIG, CHOLHDL, LDLDIRECT in the last 72 hours. Thyroid function studies: No results for input(s): TSH, T4TOTAL, T3FREE, THYROIDAB in the last 72 hours.  Invalid input(s): FREET3 Anemia work up: No results for input(s): VITAMINB12, FOLATE, FERRITIN, TIBC, IRON, RETICCTPCT in the last 72 hours. Sepsis Labs: Recent Labs  Lab 09/19/18 2202 09/20/18 0049 09/20/18 0332  WBC 2.8*  --  2.6*  LATICACIDVEN  --  1.4  --    Microbiology No results found for  this or any previous visit (from the past 240 hour(s)).   Medications:   . Darunavir-Cobicisctat-Emtricitabine-Tenofovir Alafenamide  1 tablet Oral Q breakfast  . dextrose  10 mL Intravenous Q24H  . dextrose  10 mL Intravenous Q24H  . flucytosine  25 mg/kg Oral Q6H  . sodium chloride  500 mL Intravenous Q24H  . sodium chloride  500 mL Intravenous Q24H  . sulfamethoxazole-trimethoprim  1 tablet Oral Daily   Continuous Infusions: . sodium chloride Stopped (09/20/18 0901)  . amphotericin  B  Liposome (AMBISOME) ADULT IV 340 mg (09/20/18 0602)  . cefTRIAXone (ROCEPHIN)  IV    . sodium chloride Stopped (09/20/18 0246)      LOS: 0 days   Marinda Elk  [facility]  09/20/2018, 9:25 AM

## 2018-09-20 NOTE — Progress Notes (Signed)
1430 Pt is back from a procedure, Lumbar tap, bandaid to lower mid back dry and intact. Instructed pt to stay flat on bed until 1830. Denies pain at this time.

## 2018-09-20 NOTE — ED Provider Notes (Signed)
[facility] EMERGENCY DEPARTMENT Provider Note   CSN: [id number] Arrival date & time: 09/19/18  2146    History   Chief Complaint Chief Complaint  Patient presents with  . Headache  . Fever    HPI Jacob Gutierrez is a 33 y.o. male with a history of AIDS (viral load 1.2 million on 12/19 CD count 10), pulmonary coccidioidomycosis, and syphilis, and gastroenteritis due to Cryptosporidium who presents to the emergency department with a chief complaint of fever.  The patient endorses fever, chills, headache, photophobia, and nausea for 3 days.  States the headache is "allover".  He denies neck pain, neck stiffness, visual changes, cough, body aches, shortness of breath, chest pain, vomiting, abdominal pain, or urinary symptoms.  He reports that he took Zyvox at home yesterday.  He reports he is intermittently taken a couple of doses over the last week, but he is an unreliable historian.  He is oriented to person, time, and place.      The history is provided by the patient. No language interpreter was used.    Past Medical History:  Diagnosis Date  . AIDS (acquired immune deficiency syndrome) (HCC)   . AIDS (acquired immune deficiency syndrome) (HCC)   . 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 H/O coccidioidomycosis    pulmonary   . HIV disease (HCC)   . Past history of allergy to penicillin-type antibiotic 07-03-2012   desensitization  . PNA (pneumonia)   . Shigella gastroenteritis   . Syphilis    history /treated     Patient Active Problem List   Diagnosis Date Noted  . Meningitis 09/20/2018  . Leukopenia 09/20/2018  . Thrombocytopenia (HCC) 09/20/2018  . Hyponatremia 09/20/2018  . 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 Cellulitis and abscess of right leg 07/19/2018  . Condyloma 04/24/2018  . Protein-calorie malnutrition, severe 12/12/2017  . Personal history of MRSA (methicillin resistant Staphylococcus aureus)  12/09/2017  . History of ESBL E. coli infection 12/09/2017  . AIDS (acquired immune deficiency syndrome) (HCC)   . Homelessness   . 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 Tobacco use disorder 01/02/2014  . Human immunodeficiency virus (HIV) disease (HCC) 01/01/2014    Past Surgical History:  Procedure Laterality Date  . TRANSURETHRAL RESECTION OF PROSTATE N/A 12/09/2017   Procedure: TRANSURETHRAL RESECTION OF THE PROSTATE (TURP);  Surgeon: Crist Fat, MD;  Location: [facility] ORS;  Service: Urology;  Laterality: N/A;        Home Medications    Prior to Admission medications   Medication Sig Start Date End Date Taking? Authorizing Provider  azithromycin (ZITHROMAX) 600 MG tablet Take 1 tablet (600 mg total) by mouth 2 (two) times a week. 09/06/18   Blanchard Kelch, NP  bictegravir-emtricitabine-tenofovir AF (BIKTARVY) 50-200-25 MG TABS tablet Take 1 tablet by mouth daily. 07/23/18   Rolly Salter, MD  cephALEXin (KEFLEX) 500 MG capsule Take 500 mg by mouth 4 (four) times daily.    [provider]  Darunavir-Cobicisctat-Emtricitabine-Tenofovir Alafenamide (SYMTUZA) 800-150-200-10 MG TABS Take 1 tablet by mouth daily with breakfast. 09/06/18   Blanchard Kelch, NP  linezolid (ZYVOX) 600 MG tablet Take 1 tablet (600 mg total) by mouth 2 (two) times daily. 07/19/18   Ghimire, Werner Lean, MD  sulfamethoxazole-trimethoprim (BACTRIM) 400-80 MG tablet Take 1 tablet by mouth daily. Patient taking differently: Take 1 tablet by mouth 2 (two) times daily.  07/20/18  [facility] Lov[facility] D, DO  Family History Family History  Problem Relation Age of Onset  . Hypertension Mother   . Lupus Maternal Grandmother   . Cancer Paternal Grandfather     Social History Social History   Tobacco Use  . Smoking status: Current Every Day Smoker    Packs/day: 0.50    Types: Cigarettes  . Smokeless tobacco: Never Used  . Tobacco comment: cutting back  Substance Use Topics  . Alcohol use: Yes     Alcohol/week: 1.0 standard drinks    Types: 1 Standard drinks or equivalent per week    Comment: whiskey occasionally   . Drug use: Yes    Frequency: 1.0 times per week    Types: Marijuana     Allergies   Amoxicillin and Vancomycin   Review of Systems Review of Systems  Constitutional: Positive for chills and fever.  HENT: Negative for congestion, ear pain, sinus pressure, sore throat and tinnitus.   Eyes: Positive for photophobia. Negative for visual disturbance.  Respiratory: Negative for cough and shortness of breath.   Gastrointestinal: Positive for nausea. Negative for abdominal pain, diarrhea and vomiting.  Genitourinary: Negative for dysuria and hematuria.  Musculoskeletal: Negative for back pain, neck pain and neck stiffness.  Allergic/Immunologic: Positive for immunocompromised state.  Neurological: Positive for headaches. Negative for weakness and numbness.     Physical Exam Updated Vital Signs BP 99/64   Pulse 84   Temp (!) 100.9 F (38.3 C) (Oral)   Resp 18   Ht  (1.854 m)   Wt 85.8 kg   SpO2 98%   BMI 24.96 kg/m   Physical Exam Constitutional:      Appearance: He is ill-appearing.  Eyes:     Extraocular Movements: Extraocular movements intact.     Pupils: Pupils are equal, round, and reactive to light.  Neck:     Musculoskeletal: Normal range of motion and neck supple. No neck rigidity.     Comments: Full active passive and range of motion of the neck. Cardiovascular:     Rate and Rhythm: Regular rhythm. Tachycardia present.     Heart sounds: No murmur. No friction rub. No gallop.   Pulmonary:     Effort: No respiratory distress.     Breath sounds: No stridor. No wheezing, rhonchi or rales.  Chest:     Chest wall: No tenderness.  Abdominal:     General: There is no distension.     Palpations: There is no mass.     Tenderness: There is no abdominal tenderness. There is no guarding.  Lymphadenopathy:     Cervical: No cervical adenopathy.    Skin:    General: Skin is warm.     Capillary Refill: Capillary refill takes less than 2 seconds.  Neurological:     GCS: GCS eye subscore is 3. GCS verbal subscore is 5. GCS motor subscore is 6.     Comments: Alert and oriented x3.      ED Treatments / Results  Labs (all labs ordered are listed, but only abnormal results are displayed) Labs Reviewed  COMPREHENSIVE METABOLIC PANEL - Abnormal; Notable for the following components:      Result Value   Sodium 133 (*)    Calcium 8.0 (*)    AST 47 (*)    ALT 49 (*)    All other components within normal limits  CBC WITH DIFFERENTIAL/PLATELET - Abnormal; Notable for the following components:   WBC 2.8 (*)    RBC 3.53 (*)  Hemoglobin 11.0 (*)    HCT 34.0 (*)    RDW 16.3 (*)    Platelets 142 (*)    Lymphs Abs 0.4 (*)    Abs Immature Granulocytes 0.09 (*)    All other components within normal limits  BASIC METABOLIC PANEL - Abnormal; Notable for the following components:   Sodium 134 (*)    Calcium 8.0 (*)    All other components within normal limits  CBC WITH DIFFERENTIAL/PLATELET - Abnormal; Notable for the following components:   WBC 2.6 (*)    RBC 3.47 (*)    Hemoglobin 11.1 (*)    HCT 34.2 (*)    RDW 16.4 (*)    Platelets 148 (*)    Neutro Abs 1.6 (*)    Lymphs Abs 0.4 (*)    All other components within normal limits  CULTURE, BLOOD (ROUTINE X 2)  CULTURE, BLOOD (ROUTINE X 2)  URINE CULTURE  CSF CULTURE  URINALYSIS, ROUTINE W REFLEX MICROSCOPIC  LACTIC ACID, PLASMA  INFLUENZA PANEL BY PCR (TYPE A & B)  MAGNESIUM  CSF CELL COUNT WITH DIFFERENTIAL  CSF CELL COUNT WITH DIFFERENTIAL  PROTEIN AND GLUCOSE, CSF  CRYPTOCOCCAL ANTIGEN, CSF  HERPES SIMPLEX VIRUS(HSV) DNA BY PCR  VDRL, CSF  HEPATIC FUNCTION PANEL    EKG None  Radiology Dg Chest 2 View  Result Date: 09/19/2018 CLINICAL DATA:  33 year old male with fever. EXAM: CHEST - 2 VIEW COMPARISON:  Chest radiograph dated 04/26/2018 FINDINGS: The heart size  and mediastinal contours are within normal limits. Both lungs are clear. The visualized skeletal structures are unremarkable. IMPRESSION: No active cardiopulmonary disease. Electronically Signed   By: Elgie Collard M.D.   On: 09/19/2018 22:34   Ct Head Wo Contrast  Result Date: 09/20/2018 CLINICAL DATA:  Headache for 3 days. Sinus pressure, photophobia, nausea, runny nose, malaise, fevers. History of cryptococcal infection, syphilis, HIV aids. EXAM: CT HEAD WITHOUT CONTRAST TECHNIQUE: Contiguous axial images were obtained from the base of the skull through the vertex without intravenous contrast. COMPARISON:  04/26/2018 FINDINGS: Brain: Focal encephalomalacia in the left parietal convexity consistent with old infarct and unchanged since prior study. No mass-effect or midline shift. No abnormal extra-axial fluid collections. Gray-white matter junctions are distinct. Basal cisterns are not effaced. No ventricular dilatation. No acute intracranial hemorrhage. Vascular: No hyperdense vessel or unexpected calcification. Skull: Calvarium appears intact. Sinuses/Orbits: Paranasal sinuses and mastoid air cells are clear. Other: No significant changes since previous study. IMPRESSION: No acute intracranial abnormalities. Old left parietal infarct. Electronically Signed   By: Burman Nieves M.D.   On: 09/20/2018 02:18    Procedures .Critical Care Performed by: Barkley Boards, PA-C Authorized by: Barkley Boards, PA-C   Critical care provider statement:    Critical care time (minutes):  45   Critical care time was exclusive of:  Separately billable procedures and treating other patients and teaching time   Critical care was necessary to treat or prevent imminent or life-threatening deterioration of the following conditions:  Sepsis   Critical care was time spent personally by me on the following activities:  Ordering and performing treatments and interventions, ordering and review of laboratory studies,  ordering and review of radiographic studies, pulse oximetry, re-evaluation of patient's condition, obtaining history from patient or surrogate, examination of patient, discussions with consultants, development of treatment plan with patient or surrogate and review of old charts   (including critical care time)  Medications Ordered in ED Medications  sodium chloride 0.9 %  bolus 1,000 mL (0 mLs Intravenous Stopped 09/20/18 0246)  acetaminophen (TYLENOL) tablet 650 mg (has no administration in time range)  diphenhydrAMINE (BENADRYL) injection 25 mg (has no administration in time range)    Or  diphenhydrAMINE (BENADRYL) capsule 25 mg (has no administration in time range)  meperidine (DEMEROL) injection 25 mg (has no administration in time range)  sodium chloride 0.9 % bolus 500 mL (500 mLs Intravenous Given 09/20/18 0440)  dextrose 5 % 10 mL (has no administration in time range)  amphotericin B liposome (AMBISOME) 340 mg in dextrose 5 % 500 mL IVPB (has no administration in time range)  dextrose 5 % 10 mL (has no administration in time range)  sodium chloride 0.9 % bolus 500 mL (has no administration in time range)  flucytosine (ANCOBON) capsule 2,250 mg (2,250 mg Oral Given 09/20/18 0411)  linezolid (ZYVOX) IVPB 600 mg (has no administration in time range)  cefTRIAXone (ROCEPHIN) 2 g in sodium chloride 0.9 % 100 mL IVPB (has no administration in time range)  sulfamethoxazole-trimethoprim (BACTRIM,SEPTRA) 400-80 MG per tablet 1 tablet (has no administration in time range)  Darunavir-Cobicisctat-Emtricitabine-Tenofovir Alafenamide (SYMTUZA) 800-150-200-10 MG TABS 1 tablet (has no administration in time range)  0.9 %  sodium chloride infusion (has no administration in time range)  acetaminophen (TYLENOL) tablet 650 mg (has no administration in time range)    Or  acetaminophen (TYLENOL) suppository 650 mg (has no administration in time range)  HYDROcodone-acetaminophen (NORCO/VICODIN) 5-325 MG per  tablet 1-2 tablet (1 tablet Oral Given 09/20/18 0411)  ketorolac (TORADOL) 15 MG/ML injection 15 mg (has no administration in time range)  polyethylene glycol (MIRALAX / GLYCOLAX) packet 17 g (has no administration in time range)  ondansetron (ZOFRAN) tablet 4 mg (has no administration in time range)    Or  ondansetron (ZOFRAN) injection 4 mg (has no administration in time range)  acetaminophen (TYLENOL) tablet 650 mg (650 mg Oral Given 09/20/18 0145)  cefTRIAXone (ROCEPHIN) 2 g in sodium chloride 0.9 % 100 mL IVPB (2 g Intravenous New Bag/Given 09/20/18 0435)  ondansetron (ZOFRAN-ODT) disintegrating tablet 4 mg (4 mg Oral Given 09/20/18 0409)     Initial Impression / Assessment and Plan / ED Course  I have reviewed the triage vital signs and the nursing notes.  Pertinent labs & imaging results that were available during my care of the patient were reviewed by me and considered in my medical decision making (see chart for details).  33 year old male with a history of AIDS (viral load 1.2 million on 12/19 CD count 10), pulmonary coccidioidomycosis, and syphilis, and gastroenteritis due to Cryptosporidium presenting with 3 days of fever, chills, headache, photophobia, and nausea.  On exam, he is ill-appearing.  No meningismus.  He is alert and oriented x3.  This patient is known to me as I saw him 3 weeks ago in the ER.  He has a history of noncompliance with his retro-antivirals.  He has a history of recurrent infections.  Reports he has been taking intermittent tablets of a Zyvox prescription from December.  Tachycardic in the 120s and febrile to 102.3 on arrival.  Tylenol given.  He is normotensive.   Clinical Course as of Sep 20 509  Thu Sep 20, 2018  0154 Patient recheck.  He continues to refuse LP.  We discussed risks versus benefits of delaying LP.  He refuses LP until fluoroscopy is available.  Spoke with ED pharmacist, Fayrene Fearing, and will initiate amphotericin B, flucytosine, Rocephin, and  Zyvox.  Patient states  he did take his home Zyvox, the same prescription that he was given in December, earlier today.  It sounds as if he may have taken a couple of doses over the last week, but history is questionable.   [MM]    Clinical Course User Index [MM] Torry Adamczak A, PA-C   CBC with leukopenia, which is chronic.  Mild elevation in his AST and ALT at 47 and 49.  Blood cultures x2 are pending.  Influenza panel is negative.  The patient was seen and evaluated along with Dr. Lajean Saver, attending physician.  Given onset of symptoms, lower suspicion for bacterial meningitis since symptom onset has been 3 days.  However, will cover the patient with Zyvox and Rocephin.  I have also spoken with Fayrene Fearing and pharmacy and will order amphotericin B and flucytosine.  Dr. opened with the hospitalist team has been consulted and has accepted the patient for admission.  Consulted infectious disease as requested by hospitalist team and spoke with Dr. Ilsa Iha who is in agreement with initiating antifungals and covering the patient with antibiotics.  ID will see the patient in the morning.  She also recommended ordering a serum cryptococcal antigen, which has already been ordered, and placing order for fluoroscopy guided LP.  Order placed. The patient appears reasonably stabilized for admission considering the current resources, flow, and capabilities available in the ED at this time, and I doubt any other [facility] requiring further screening and/or treatment in the ED prior to admission.     Final Clinical Impressions(s) / ED Diagnoses   Final diagnoses:  Meningitis    ED Discharge Orders    None       Barkley Boards, PA-C 09/20/18 0511    Glynn Octave, MD 09/20/18 [id number]

## 2018-09-20 NOTE — Plan of Care (Signed)
  Problem: Education: Goal: Knowledge of General Education information will improve Description: Including pain rating scale, medication(s)/side effects and non-pharmacologic comfort measures Outcome: Progressing   Problem: Coping: Goal: Level of anxiety will decrease Outcome: Progressing   Problem: Safety: Goal: Ability to remain free from injury will improve Outcome: Progressing   

## 2018-09-20 NOTE — ED Notes (Signed)
Pt difficult stick. IV team order placed

## 2018-09-20 NOTE — H&P (Signed)
History and Physical    Jacob ColumbiaDesmond J Beaudoin LKG:[id number] DOB: [birth year] DOA: 09/19/2018  PCP: Grayce SessionsEdwards, Michelle P, NP   Patient coming from: Home/homeless   Chief Complaint: Headache, fever/chills   HPI: Jacob Gutierrez is a 33 y.o. male with medical history significant for HIV/AIDS (CD4 10 and viral load 1.2 million in December), now presenting to the emergency department for evaluation of headache and fevers.  Patient reports that the headache began several days ago and he has also been experiencing fevers and chills over the same interval.  He had reported sinus pressure, nausea, and photophobia earlier in the ED, but denies any of that now.  He also denies any cough or shortness of breath, denies abdominal pain, and denies any vomiting or diarrhea.  ED Course: Upon arrival to the ED, patient is found to be febrile to 39.1 C, saturating well on room air, tachycardic in the 120s, and with stable blood pressure.  Urinalysis is unremarkable and chest x-ray negative for acute cardiopulmonary disease.  Lactic acid is reassuringly normal and influenza PCR is negative.  Chemistry panel is notable for sodium of 133 and slight elevation in transaminases.  CBC features a chronic leukopenia, mild normocytic anemia, and mild thrombocytopenia.  Head CT is negative for acute intracranial abnormality.  Patient refused LP in the ED.  Blood cultures were collected and the patient was treated with acetaminophen, amphotericin B, Rocephin, flucytosine, linezolid, and 2 L normal saline in the ED.  Infectious disease was consulted by the ED physician and recommended continuing the current treatments.   Review of Systems:  All other systems reviewed and apart from HPI, are negative.  Past Medical History:  Diagnosis Date  . AIDS (acquired immune deficiency syndrome) (HCC)   . AIDS (acquired immune deficiency syndrome) (HCC)   . 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 H/O  coccidioidomycosis    pulmonary   . HIV disease (HCC)   . Past history of allergy to penicillin-type antibiotic 07-03-2012   desensitization  . PNA (pneumonia)   . Shigella gastroenteritis   . Syphilis    history /treated     Past Surgical History:  Procedure Laterality Date  . TRANSURETHRAL RESECTION OF PROSTATE N/A 12/09/2017   Procedure: TRANSURETHRAL RESECTION OF THE PROSTATE (TURP);  Surgeon: Crist FatHerrick, Benjamin W, MD;  Location: [facility];  Service: Urology;  Laterality: N/A;     reports that he has been smoking cigarettes. He has been smoking about 0.50 packs per day. He has never used smokeless tobacco. He reports current alcohol use of about 1.0 standard drinks of alcohol per week. He reports current drug use. Frequency: 1.00 time per week. Drug: Marijuana.  Allergies  Allergen Reactions  . Amoxicillin Other (See Comments)    From childhood: "I had a reaction when i was little." (??)  . Vancomycin Itching    Angioedema    Family History  Problem Relation Age of Onset  . Hypertension Mother   . Lupus Maternal Grandmother   . Cancer Paternal Grandfather      Prior to Admission medications   Medication Sig Start Date End Date Taking? Authorizing Provider  azithromycin (ZITHROMAX) 600 MG tablet Take 1 tablet (600 mg total) by mouth 2 (two) times a week. 09/06/18   Blanchard Kelchixon, Stephanie N, NP  bictegravir-emtricitabine-tenofovir AF (BIKTARVY) 50-200-25 MG TABS tablet Take 1 tablet by mouth daily. 07/23/18   Rolly SalterPatel, Pranav M, MD  cephALEXin (KEFLEX) 500 MG capsule Take 500 mg  by mouth 4 (four) times daily.    [provider]  Darunavir-Cobicisctat-Emtricitabine-Tenofovir Alafenamide (SYMTUZA) 800-150-200-10 MG TABS Take 1 tablet by mouth daily with breakfast. 09/06/18   Blanchard Kelch, NP  linezolid (ZYVOX) 600 MG tablet Take 1 tablet (600 mg total) by mouth 2 (two) times daily. 07/19/18   Ghimire, Werner Lean, MD  sulfamethoxazole-trimethoprim (BACTRIM) 400-80 MG tablet  Take 1 tablet by mouth daily. Patient taking differently: Take 1 tablet by mouth 2 (two) times daily.  07/20/18   Maurilio Lovely D, DO    Physical Exam: Vitals:   09/19/18 2158 09/20/18 0058 09/20/18 0115 09/20/18 0130  BP:   108/70 110/74  Pulse:   97   Resp:   18   Temp:  (!) 100.9 F (38.3 C)    TempSrc:  Oral    SpO2:   98%   Weight: 85.8 kg     Height: 6\' 1"  (1.854 m)       Constitutional: NAD, calm  Eyes: PERTLA, lids and conjunctivae normal ENMT: Mucous membranes are moist. Posterior pharynx clear of any exudate or lesions.   Neck: no masses, no thyromegaly. Pain with flexion.  Respiratory: clear to auscultation bilaterally, no wheezing, no crackles. Normal respiratory effort.    Cardiovascular: S1 & S2 heard, regular rate and rhythm. No extremity edema.   Abdomen: No distension, no tenderness, soft. Bowel sounds normal.  Musculoskeletal: no clubbing / cyanosis. No joint deformity upper and lower extremities.    Skin: no significant rashes, lesions, ulcers. Warm, dry, well-perfused. Neurologic: CN 2-12 grossly intact. Sensation intact. Strength 5/5 in all 4 limbs.  Psychiatric: Alert and oriented x 3. Calm.   Labs on Admission: I have personally reviewed following labs and imaging studies  CBC: Recent Labs  Lab 09/19/18 2202  WBC 2.8*  NEUTROABS 1.7  HGB 11.0*  HCT 34.0*  MCV 96.3  PLT 142*   Basic Metabolic Panel: Recent Labs  Lab 09/19/18 2202  NA 133*  K 3.5  CL 102  CO2 23  GLUCOSE 88  BUN 15  CREATININE 0.65  CALCIUM 8.0*   GFR: Estimated Creatinine Clearance: 145.7 mL/min (by C-G formula based on SCr of 0.65 mg/dL). Liver Function Tests: Recent Labs  Lab 09/19/18 2202  AST 47*  ALT 49*  ALKPHOS 66  BILITOT 0.3  PROT 7.6  ALBUMIN 3.5   No results for input(s): LIPASE, AMYLASE in the last 168 hours. No results for input(s): AMMONIA in the last 168 hours. Coagulation Profile: No results for input(s): INR, PROTIME in the last 168  hours. Cardiac Enzymes: No results for input(s): CKTOTAL, CKMB, CKMBINDEX, TROPONINI in the last 168 hours. BNP (last 3 results) No results for input(s): PROBNP in the last 8760 hours. HbA1C: No results for input(s): HGBA1C in the last 72 hours. CBG: No results for input(s): GLUCAP in the last 168 hours. Lipid Profile: No results for input(s): CHOL, HDL, LDLCALC, TRIG, CHOLHDL, LDLDIRECT in the last 72 hours. Thyroid Function Tests: No results for input(s): TSH, T4TOTAL, FREET4, T3FREE, THYROIDAB in the last 72 hours. Anemia Panel: No results for input(s): VITAMINB12, FOLATE, FERRITIN, TIBC, IRON, RETICCTPCT in the last 72 hours. Urine analysis:    Component Value Date/Time   COLORURINE YELLOW 09/19/2018 2200   APPEARANCEUR CLEAR 09/19/2018 2200   APPEARANCEUR Hazy 07/27/2013 1742   LABSPEC 1.020 09/19/2018 2200   LABSPEC 1.026 07/27/2013 1742   PHURINE 6.0 09/19/2018 2200   GLUCOSEU NEGATIVE 09/19/2018 2200   GLUCOSEU Negative 07/27/2013 1742  HGBUR NEGATIVE 09/19/2018 2200   BILIRUBINUR NEGATIVE 09/19/2018 2200   BILIRUBINUR Negative 07/27/2013 1742   KETONESUR NEGATIVE 09/19/2018 2200   PROTEINUR NEGATIVE 09/19/2018 2200   UROBILINOGEN 1 12/17/2013 1118   NITRITE NEGATIVE 09/19/2018 2200   LEUKOCYTESUR NEGATIVE 09/19/2018 2200   LEUKOCYTESUR Negative 07/27/2013 1742   Sepsis Labs: @LABRCNTIP (procalcitonin:4,lacticidven:4) )No results found for this or any previous visit (from the past 240 hour(s)).   Radiological Exams on Admission: Dg Chest 2 View  Result Date: 09/19/2018 CLINICAL DATA:  33 year old male with fever. EXAM: CHEST - 2 VIEW COMPARISON:  Chest radiograph dated 04/26/2018 FINDINGS: The heart size and mediastinal contours are within normal limits. Both lungs are clear. The visualized skeletal structures are unremarkable. IMPRESSION: No active cardiopulmonary disease. Electronically Signed   By: Elgie Collard M.D.   On: 09/19/2018 22:34   Ct Head Wo  Contrast  Result Date: 09/20/2018 CLINICAL DATA:  Headache for 3 days. Sinus pressure, photophobia, nausea, runny nose, malaise, fevers. History of cryptococcal infection, syphilis, HIV aids. EXAM: CT HEAD WITHOUT CONTRAST TECHNIQUE: Contiguous axial images were obtained from the base of the skull through the vertex without intravenous contrast. COMPARISON:  04/26/2018 FINDINGS: Brain: Focal encephalomalacia in the left parietal convexity consistent with old infarct and unchanged since prior study. No mass-effect or midline shift. No abnormal extra-axial fluid collections. Gray-white matter junctions are distinct. Basal cisterns are not effaced. No ventricular dilatation. No acute intracranial hemorrhage. Vascular: No hyperdense vessel or unexpected calcification. Skull: Calvarium appears intact. Sinuses/Orbits: Paranasal sinuses and mastoid air cells are clear. Other: No significant changes since previous study. IMPRESSION: No acute intracranial abnormalities. Old left parietal infarct. Electronically Signed   By: Burman Nieves M.D.   On: 09/20/2018 02:18    EKG: Not performed.   Assessment/Plan   1. Headache, fevers  - Presents with several days of fevers and headache, found to be febrile to 39.1 C with tachycardia and leukopenia (chronic)  - No evidence for infection on CXR or UA, influenza PCR negative, abdominal exam benign - There is no focal neurologic deficit or seizures  - He refused LP in ED but agreed if could be done with imaging-guidance  - ID is consulting and much appreciated, recommended amphotericin B, flucytosine, Rocephin, and linezolid   2. AIDS  - CD4 count 10 and VL 1.22 million in December  - Continue Symtuza and Bactrim   3. Hyponatremia  - Serum sodium is 133 in setting of hypovolemia  - Treated with 2 liters NS in ED and continued on IVF hydration  - Repeat chem panel in am   4. Leukopenia; thrombocytopenia  - WBC is 2,800 on admission and platelet count 142k   - Leukopenia appears to be chronic, mild thrombocytopenia new without bleeding and possible related to infection   5. Elevated transaminases  - Slight elevation in transaminases noted, possibly related to azithromycin, will trend    DVT prophylaxis: SCD's  Code Status: Full  Family Communication: Discussed with patient  Consults called: infectious disease  Admission status: Observation     Briscoe Deutscher, MD [facility] Pager [phone]  If 7PM-7AM, please contact night-coverage [email or web address] Password [facility]  09/20/2018, 3:18 AM

## 2018-09-21 LAB — CBC
HCT: 34.4 % — ABNORMAL LOW (ref 39.0–52.0)
Hemoglobin: 11 g/dL — ABNORMAL LOW (ref 13.0–17.0)
MCH: 30.5 pg (ref 26.0–34.0)
MCHC: 32 g/dL (ref 30.0–36.0)
MCV: 95.3 fL (ref 80.0–100.0)
Platelets: 121 10*3/uL — ABNORMAL LOW (ref 150–400)
RBC: 3.61 MIL/uL — ABNORMAL LOW (ref 4.22–5.81)
RDW: 16.3 % — ABNORMAL HIGH (ref 11.5–15.5)
WBC: 2.2 10*3/uL — ABNORMAL LOW (ref 4.0–10.5)
nRBC: 0 % (ref 0.0–0.2)

## 2018-09-21 LAB — HSV DNA BY PCR (REFERENCE LAB)
HSV 1 DNA: NEGATIVE
HSV 2 DNA: NEGATIVE

## 2018-09-21 LAB — BASIC METABOLIC PANEL
Anion gap: 7 (ref 5–15)
BUN: 11 mg/dL (ref 6–20)
CO2: 23 mmol/L (ref 22–32)
Calcium: 7.6 mg/dL — ABNORMAL LOW (ref 8.9–10.3)
Chloride: 105 mmol/L (ref 98–111)
Creatinine, Ser: 0.65 mg/dL (ref 0.61–1.24)
GFR calc Af Amer: >60 mL/min (ref >60)
GFR calc non Af Amer: >60 mL/min (ref >60)
Glucose, Bld: 113 mg/dL — ABNORMAL HIGH (ref 70–99)
Potassium: 3.9 mmol/L (ref 3.5–5.1)
Sodium: 135 mmol/L (ref 135–145)

## 2018-09-21 LAB — VDRL, CSF: SYPHILIS VDRL QUANT CSF: NONREACTIVE

## 2018-09-21 LAB — URINE CULTURE: Culture: NO GROWTH

## 2018-09-21 LAB — MAGNESIUM: Magnesium: 2.3 mg/dL (ref 1.7–2.4)

## 2018-09-21 MED ORDER — AZITHROMYCIN 600 MG PO TABS: 1200.0000 mg | ORAL_TABLET | ORAL | 5 refills | Status: DC

## 2018-09-21 MED ORDER — AZITHROMYCIN 600 MG PO TABS
1200.0000 mg | ORAL_TABLET | ORAL | Status: DC
  Administered 2018-09-21: 1200 mg via ORAL
  Filled 2018-09-21: qty 2

## 2018-09-21 MED ORDER — DARUN-COBIC-EMTRICIT-TENOFAF 800-150-200-10 MG PO TABS
1.0000 | ORAL_TABLET | Freq: Every day | ORAL | Status: DC
  Administered 2018-09-21 – 2018-09-22 (×2): 1 via ORAL
  Filled 2018-09-21 (×2): qty 1

## 2018-09-21 NOTE — Plan of Care (Signed)
  Problem: Education: Goal: Knowledge of General Education information will improve Description Including pain rating scale, medication(s)/side effects and non-pharmacologic comfort measures Outcome: Progressing   Problem: Clinical Measurements: Goal: Diagnostic test results will improve Outcome: Progressing Goal: Respiratory complications will improve Outcome: Not Applicable Goal: Cardiovascular complication will be avoided Outcome: Progressing   Problem: Activity: Goal: Risk for activity intolerance will decrease Outcome: Progressing   Problem: Nutrition: Goal: Adequate nutrition will be maintained Outcome: Progressing   Problem: Elimination: Goal: Will not experience complications related to bowel motility Outcome: Progressing Goal: Will not experience complications related to urinary retention Outcome: Progressing

## 2018-09-21 NOTE — Plan of Care (Signed)
  Problem: Pain Managment: Goal: General experience of comfort will improve Outcome: Progressing   

## 2018-09-21 NOTE — Progress Notes (Signed)
[facility] PROGRESS NOTE    Progress Note  Jacob Gutierrez  MBT:[id number] DOB: [birth year] DOA: 09/19/2018 PCP: Grayce Sessions, NP     Brief Narrative:   Jacob Gutierrez is an 33 y.o. male past medical history of HIV with a CD4 count of 10 viral load of 1.2 million on December presents to the emergency room for evaluation of headache and fever reports that it started several days before admission, he started having also photophobia in the ED, in the ED he was found to have a temperature tachycardic, influenza negative UA and chest x-ray were unremarkable.  CBC showed mild leukopenia, CT head negative for any intrcranial abnormalities patient refused LP in the ED blood cultures were collected he was started on amphotericin, Rocephin fluoxetine and will He was given 2 L of normal saline infectious disease was consulted by the ED physician  Assessment/Plan:   Headache and fever possibly due to Meningitis: He has no focal neurological deficits, he refused LP by ED, IR to perform LP. Appreciate IDs assistance currently on Linzolid, cryptococcal antigen is negative.  Amphotericin B and flucytosine have been held. CSF showed 4 white blood cells elevated protein.  AIDS (acquired immune deficiency syndrome) (HCC) CD4 count 10, with a viral load 1.2 million. Continues Azothro and Bactrim for prophylaxis. Cont symtuza.  Hyponatremia: Treated with 2 L normal saline, likely due to hypovolemia improved.  Leukopenia Appears to be chronic likely related to HIV infection.    DVT prophylaxis: SCD Family Communication:none Disposition Plan/Barrier to D/C: unable to determine Code Status:     Code Status Orders  (From admission, onward)         Start     Ordered   09/20/18 0318  Full code  Continuous     09/20/18 0318        Code Status History    Date Active Date Inactive Code Status Order ID Comments User Context   07/19/2018 0022 07/24/2018 1940 Full Code [id number]   Hillary Bow, DO ED   06/09/2018 1001 06/12/2018 1837 Full Code [id number]  Levora Dredge, MD ED   04/27/2018 0231 05/01/2018 1850 Full Code [id number]  Eduard Clos, MD ED   12/10/2017 0023 12/15/2017 1926 Full Code [id number]  Hillary Bow, DO Inpatient   03/05/2017 2206 03/08/2017 2104 Full Code [id number]  Beaulah Dinning, MD Inpatient        IV Access:    Peripheral IV   Procedures and diagnostic studies:   Dg Chest 2 View  Result Date: 09/19/2018 CLINICAL DATA:  33 year old male with fever. EXAM: CHEST - 2 VIEW COMPARISON:  Chest radiograph dated 04/26/2018 FINDINGS: The heart size and mediastinal contours are within normal limits. Both lungs are clear. The visualized skeletal structures are unremarkable. IMPRESSION: No active cardiopulmonary disease. Electronically Signed   By: Elgie Collard M.D.   On: 09/19/2018 22:34   Ct Head Wo Contrast  Result Date: 09/20/2018 CLINICAL DATA:  Headache for 3 days. Sinus pressure, photophobia, nausea, runny nose, malaise, fevers. History of cryptococcal infection, syphilis, HIV aids. EXAM: CT HEAD WITHOUT CONTRAST TECHNIQUE: Contiguous axial images were obtained from the base of the skull through the vertex without intravenous contrast. COMPARISON:  04/26/2018 FINDINGS: Brain: Focal encephalomalacia in the left parietal convexity consistent with old infarct and unchanged since prior study. No mass-effect or midline shift. No abnormal extra-axial fluid collections. Gray-white matter junctions are distinct. Basal cisterns are not effaced. No ventricular dilatation. No acute intracranial hemorrhage. Vascular: No  hyperdense vessel or unexpected calcification. Skull: Calvarium appears intact. Sinuses/Orbits: Paranasal sinuses and mastoid air cells are clear. Other: No significant changes since previous study. IMPRESSION: No acute intracranial abnormalities. Old left parietal infarct. Electronically Signed   By: Burman Nieves M.D.    On: 09/20/2018 02:18   Dg Fluoro Guide Lumbar Puncture  Result Date: 09/20/2018 CLINICAL DATA:  HIV.  Headache and fever. EXAM: DIAGNOSTIC LUMBAR PUNCTURE UNDER FLUOROSCOPIC GUIDANCE FLUOROSCOPY TIME:  Fluoroscopy Time:  0 minutes 12 second Radiation Exposure Index (if provided by the fluoroscopic device): Number of Acquired Spot Images: 0 PROCEDURE: Informed consent was obtained from the patient prior to the procedure, including potential complications of headache, allergy, and pain. With the patient prone, the lower back was prepped with Betadine. 1% Lidocaine was used for local anesthesia. Lumbar puncture was performed at the L4-5 level using a 22 gauge needle with return of clear CSF with an opening pressure of 18 cm water. Twelve ml of CSF were obtained for laboratory studies. The patient tolerated the procedure well and there were no apparent complications. IMPRESSION: Successful lumbar puncture using fluoroscopy. Electronically Signed   By: Marlan Palau M.D.   On: 09/20/2018 14:00     Medical Consultants:    None.  Anti-Infectives:   IV Rocephin, LInezolid and flucytosine Oral bactyrim  Subjective:    Jacob Gutierrez no complains today feels better, no headache in a good mood.  Objective:    Vitals:   09/20/18 2058 09/21/18 0531 09/21/18 0544 09/21/18 0935  BP: 111/72 109/75 107/65 113/75  Pulse: 68 69 67 73  Resp: Temp: 97.7 F (36.5 C)  98.4 F (36.9 C) 98.4 F (36.9 C)  TempSrc: Oral   Oral  SpO2: 97% 95% 98% 100%  Weight:      Height:        Intake/Output Summary (Last 24 hours) at 09/21/2018 1041 Last data filed at 09/21/2018 0900 Gross per 24 hour  Intake 1960 ml  Output 1202 ml  Net 758 ml   Filed Weights   09/19/18 2153 09/19/18 2158  Weight: 85.7 kg 85.8 kg    Exam: General exam: In no acute distress. Respiratory system: Good air movement and clear to auscultation. Cardiovascular system: S1 & S2 heard, RRR.  Gastrointestinal  system: Abdomen is nondistended, soft and nontender.  Central nervous system: Alert and oriented. No focal neurological deficits. Extremities: No pedal edema. Skin: No rashes, lesions or ulcers   Data Reviewed:    Labs: Basic Metabolic Panel: Recent Labs  Lab 09/19/18 2202 09/20/18 0312 09/21/18 0310  NA 133* 134* 135  K 3.5 3.7 3.9  CL 102 99 105  CO2 GLUCOSE 88 85 113*  BUN CREATININE 0.65 0.67 0.65  CALCIUM 8.0* 8.0* 7.6*  MG  --  2.3 2.3   GFR Estimated Creatinine Clearance: 145.7 mL/min (by C-G formula based on SCr of 0.65 mg/dL). Liver Function Tests: Recent Labs  Lab 09/19/18 2202 09/20/18 0312  AST 47* 53*  ALT 49* 52*  ALKPHOS 66 66  BILITOT 0.3 0.7  PROT 7.6 7.9  ALBUMIN 3.5 3.4*   No results for input(s): LIPASE, AMYLASE in the last 168 hours. No results for input(s): AMMONIA in the last 168 hours. Coagulation profile No results for input(s): INR, PROTIME in the last 168 hours.  CBC: Recent Labs  Lab 09/19/18 2202 09/20/18 0332 09/21/18 0310  WBC 2.8* 2.6* 2.2*  NEUTROABS 1.7  1.6*  --   HGB 11.0* 11.1* 11.0*  HCT 34.0* 34.2* 34.4*  MCV 96.3 98.6 95.3  PLT 142* 148* 121*   Cardiac Enzymes: No results for input(s): CKTOTAL, CKMB, CKMBINDEX, TROPONINI in the last 168 hours. BNP (last 3 results) No results for input(s): PROBNP in the last 8760 hours. CBG: No results for input(s): GLUCAP in the last 168 hours. D-Dimer: No results for input(s): DDIMER in the last 72 hours. Hgb A1c: No results for input(s): HGBA1C in the last 72 hours. Lipid Profile: No results for input(s): CHOL, HDL, LDLCALC, TRIG, CHOLHDL, LDLDIRECT in the last 72 hours. Thyroid function studies: No results for input(s): TSH, T4TOTAL, T3FREE, THYROIDAB in the last 72 hours.  Invalid input(s): FREET3 Anemia work up: No results for input(s): VITAMINB12, FOLATE, FERRITIN, TIBC, IRON, RETICCTPCT in the last 72 hours. Sepsis Labs: Recent Labs  Lab  09/19/18 2202 09/20/18 0049 09/20/18 0332 09/21/18 0310  WBC 2.8*  --  2.6* 2.2*  LATICACIDVEN  --  1.4  --   --    Microbiology Recent Results (from the past 240 hour(s))  Blood culture (routine x 2)     Status: None (Preliminary result)   Collection Time: 09/20/18 12:49 AM  Result Value Ref Range Status   Specimen Description BLOOD LEFT ARM  Final   Special Requests   Final    BOTTLES DRAWN AEROBIC AND ANAEROBIC Blood Culture adequate volume   Culture NO GROWTH 1 DAY  Final   Report Status PENDING  Incomplete  Blood culture (routine x 2)     Status: None (Preliminary result)   Collection Time: 09/20/18  1:13 AM  Result Value Ref Range Status   Specimen Description BLOOD RIGHT ARM  Final   Special Requests   Final    BOTTLES DRAWN AEROBIC AND ANAEROBIC Blood Culture results may not be optimal due to an excessive volume of blood received in culture bottles   Culture NO GROWTH 1 DAY  Final   Report Status PENDING  Incomplete  Urine Culture     Status: None   Collection Time: 09/20/18  5:51 AM  Result Value Ref Range Status   Specimen Description URINE, CLEAN CATCH  Final   Special Requests NONE  Final   Culture   Final    NO GROWTH Performed at [facility], [address] [place], [place] [address]    Report Status 09/21/2018 FINAL  Final  CSF culture     Status: None (Preliminary result)   Collection Time: 09/20/18  1:21 PM  Result Value Ref Range Status   Specimen Description CSF  Final   Special Requests NONE  Final   Gram Stain   Final    CYTOSPIN SMEAR WBC PRESENT, PREDOMINANTLY MONONUCLEAR NO ORGANISMS SEEN Performed at [facility], [address], [place], [place] [address]    Culture NO GROWTH < 24 HOURS  Final   Report Status PENDING  Incomplete  MRSA PCR Screening     Status: None   Collection Time: 09/20/18  2:52 PM  Result Value Ref Range Status   MRSA by PCR NEGATIVE NEGATIVE Final    Comment:        The GeneXpert MRSA Assay  (FDA approved for NASAL specimens only), is one component of a comprehensive MRSA colonization surveillance program. It is not intended to diagnose MRSA infection nor to guide or monitor treatment for MRSA infections. Performed at [facility], [address] [place], [place]  [address]      Medications:   . azithromycin  1,200 mg Oral Weekly  . Darunavir-Cobicisctat-Emtricitabine-Tenofovir Alafenamide  1 tablet Oral Q breakfast  . sulfamethoxazole-trimethoprim  1 tablet Oral Daily   Continuous Infusions:     LOS: 1 day   Marinda Elk  [facility]  09/21/2018, 10:41 AM

## 2018-09-21 NOTE — Progress Notes (Signed)
[facility]  Date of Admission:  09/19/2018     Total days of antibiotics 2   L-ampho + flucytosine    Patient ID: Jacob Gutierrez is a 33 y.o. M with  Principal Problem:   Fever Active Problems:   AIDS (acquired immune deficiency syndrome) (HCC)   Headache   Aseptic meningitis   Leukopenia   Thrombocytopenia (HCC)   Hyponatremia   . azithromycin  1,200 mg Oral Weekly  . Darunavir-Cobicisctat-Emtricitabine-Tenofovir Alafenamide  1 tablet Oral Q breakfast  . sulfamethoxazole-trimethoprim  1 tablet Oral Daily    SUBJECTIVE: Feeling fine "waiting on studies to come back." He does not have symtuza on him only old [place]. Says his mom has to go to pharmacy for him today to get a rx that is ready.  No new complaints. Headache improved. He is eating well. No fevers. Reports consistently taking medications 3 weeks now with exception of 2-3 missed doses. He is using a pill tray and key chain to ensure adherence.   He tells me he is moving to [place] in April to help his sister out with newborn.   Review of Systems: Review of Systems  Constitutional: Negative for fever.  HENT: Positive for congestion. Negative for sore throat.   Eyes: Negative for blurred vision and double vision.  Respiratory: Negative for cough.   Cardiovascular: Negative for chest pain.  Genitourinary: Negative for dysuria.  Musculoskeletal: Negative for myalgias and neck pain.  Skin: Negative for rash.  Neurological: Negative for dizziness, focal weakness and headaches.    Allergies  Allergen Reactions  . Amoxicillin Other (See Comments)    From childhood: "I had a reaction when i was little." (??)  . Vancomycin Itching    Angioedema    OBJECTIVE: Vitals:   09/20/18 2058 09/21/18 0531 09/21/18 0544 09/21/18 0935  BP: 111/72 109/75 107/65 113/75  Pulse: 68 69 67 73  Resp: Temp: 97.7 F (36.5 C)  98.4 F (36.9 C) 98.4 F (36.9 C)  TempSrc:  Oral   Oral  SpO2: 97% 95% 98% 100%  Weight:      Height:       Body mass index is 24.96 kg/m.  Physical Exam Vitals signs and nursing note reviewed.  Constitutional:      Comments: Seated in bed comfortably in no distress.  HENT:     Mouth/Throat:     Mouth: Mucous membranes are moist.     Pharynx: Oropharynx is clear.  Eyes:     Extraocular Movements: Extraocular movements intact.  Neck:     Musculoskeletal: Normal range of motion. No neck rigidity.  Cardiovascular:     Rate and Rhythm: Normal rate and regular rhythm.     Heart sounds: No murmur.  Pulmonary:     Effort: Pulmonary effort is normal.     Breath sounds: Normal breath sounds.  Abdominal:     General: Bowel sounds are normal.     Palpations: Abdomen is soft.  Lymphadenopathy:     Cervical: No cervical adenopathy.  Skin:    General: Skin is warm and dry.     Capillary Refill: Capillary refill takes less than 2 seconds.  Neurological:     Mental Status: He is alert and oriented to person, place, and time.     Cranial Nerves: No cranial nerve deficit.     Motor: No weakness.  Psychiatric:  Mood and Affect: Mood normal.        Speech: Speech normal.        Behavior: Behavior normal.     Lab Results Lab Results  Component Value Date   WBC 2.2 (L) 09/21/2018   HGB 11.0 (L) 09/21/2018   HCT 34.4 (L) 09/21/2018   MCV 95.3 09/21/2018   PLT 121 (L) 09/21/2018    Lab Results  Component Value Date   CREATININE 0.65 09/21/2018   BUN 11 09/21/2018   NA 135 09/21/2018   K 3.9 09/21/2018   CL 105 09/21/2018   CO2 23 09/21/2018    Lab Results  Component Value Date   ALT 52 (H) 09/20/2018   AST 53 (H) 09/20/2018   ALKPHOS 66 09/20/2018   BILITOT 0.7 09/20/2018     Microbiology: Recent Results (from the past 240 hour(s))  Blood culture (routine x 2)     Status: None (Preliminary result)   Collection Time: 09/20/18 12:49 AM  Result Value Ref Range Status   Specimen Description BLOOD LEFT ARM   Final   Special Requests   Final    BOTTLES DRAWN AEROBIC AND ANAEROBIC Blood Culture adequate volume   Culture NO GROWTH 1 DAY  Final   Report Status PENDING  Incomplete  Blood culture (routine x 2)     Status: None (Preliminary result)   Collection Time: 09/20/18  1:13 AM  Result Value Ref Range Status   Specimen Description BLOOD RIGHT ARM  Final   Special Requests   Final    BOTTLES DRAWN AEROBIC AND ANAEROBIC Blood Culture results may not be optimal due to an excessive volume of blood received in culture bottles   Culture NO GROWTH 1 DAY  Final   Report Status PENDING  Incomplete  Urine Culture     Status: None   Collection Time: 09/20/18  5:51 AM  Result Value Ref Range Status   Specimen Description URINE, CLEAN CATCH  Final   Special Requests NONE  Final   Culture   Final    NO GROWTH Performed at [facility], [address] [place], [place] [address]    Report Status 09/21/2018 FINAL  Final  CSF culture     Status: None (Preliminary result)   Collection Time: 09/20/18  1:21 PM  Result Value Ref Range Status   Specimen Description CSF  Final   Special Requests NONE  Final   Gram Stain   Final    CYTOSPIN SMEAR WBC PRESENT, PREDOMINANTLY MONONUCLEAR NO ORGANISMS SEEN Performed at [facility], [address], [place], [place] [address]    Culture NO GROWTH < 24 HOURS  Final   Report Status PENDING  Incomplete  MRSA PCR Screening     Status: None   Collection Time: 09/20/18  2:52 PM  Result Value Ref Range Status   MRSA by PCR NEGATIVE NEGATIVE Final    Comment:        The GeneXpert MRSA Assay (FDA approved for NASAL specimens only), is one component of a comprehensive MRSA colonization surveillance program. It is not intended to diagnose MRSA infection nor to guide or monitor treatment for MRSA infections. Performed at [facility], [address], [place], [place] [address]    ASSESSMENT & PLAN:   Jacob Gutierrez is a 33 y.o. male  with advanced HIV/AIDS disease admitted for fevers, headache and neck pain.   PLAN: 1. Headache = LP with only 4 WBC, nothing  on stain and no growth 24h. High normal opening pressure. Protein elevated and glucose on lower end of normal (in the setting of low-normal serum and NPO status). Cryptococcal Ag on CSF also negative >> stop antifungals. Will check enterovirus panel on CSF as he reports some nasal congestion/rhinorrhea prior to h/a and neck pain/fevers. Likely more aseptic meningitis presentation. D/C droplet precautions.   2. Fever = would continue to follow off antibiotics. Viral component vs immune reactivation with consistent HAART. No bacterial process identified at this point. Follow micro. Probably ready for D/C home tomorrow if all negative and afebrile.   3. HIV/AIDS = resume Symtuza daily. Will continue azithromycin and bactrim as well and follow labs for tolerability. Working with Paramedic. He has an appointment to see me in about 2 weeks March 12th @ 2:45 pm  4. Non-adherence = seems to have some motivation behind nephew and helping his sister out; discussion today about staying healthy for nephew. Will discuss further with Bridge Counselor to help.  Rexene Alberts, MSN, NP-C [facility] Pager: [phone]  09/21/2018  1:50 PM

## 2018-09-21 NOTE — Progress Notes (Signed)
CSW provided homeless resource packet. Patient accepting.   CSW signing off.   [place], [place] [phone]

## 2018-09-22 LAB — BASIC METABOLIC PANEL
Anion gap: 7 (ref 5–15)
BUN: 14 mg/dL (ref 6–20)
CO2: 26 mmol/L (ref 22–32)
Calcium: 8.6 mg/dL — ABNORMAL LOW (ref 8.9–10.3)
Chloride: 103 mmol/L (ref 98–111)
Creatinine, Ser: 0.65 mg/dL (ref 0.61–1.24)
GFR calc Af Amer: >60 mL/min (ref >60)
GFR calc non Af Amer: >60 mL/min (ref >60)
GLUCOSE: 129 mg/dL — AB (ref 70–99)
Potassium: 3.8 mmol/L (ref 3.5–5.1)
Sodium: 136 mmol/L (ref 135–145)

## 2018-09-22 LAB — CBC
HCT: 40.1 % (ref 39.0–52.0)
Hemoglobin: 13.4 g/dL (ref 13.0–17.0)
MCH: 31.5 pg (ref 26.0–34.0)
MCHC: 33.4 g/dL (ref 30.0–36.0)
MCV: 94.4 fL (ref 80.0–100.0)
Platelets: 149 10*3/uL — ABNORMAL LOW (ref 150–400)
RBC: 4.25 MIL/uL (ref 4.22–5.81)
RDW: 16.3 % — ABNORMAL HIGH (ref 11.5–15.5)
WBC: 2.6 10*3/uL — ABNORMAL LOW (ref 4.0–10.5)
nRBC: 0 % (ref 0.0–0.2)

## 2018-09-22 LAB — MAGNESIUM: Magnesium: 2.5 mg/dL — ABNORMAL HIGH (ref 1.7–2.4)

## 2018-09-22 NOTE — Discharge Summary (Signed)
Physician Discharge Summary  Jacob Gutierrez WUX:[id number] DOB: January 26, 1986 DOA: 09/19/2018  PCP: Grayce Sessions, NP  Admit date: 09/19/2018 Discharge date: 09/22/2018  Admitted From: Home Disposition:  Home  Recommendations for Outpatient Follow-up:  1. Follow up with ID in 1-2 weeks 2. Please obtain BMP/CBC in one week  Home Health:No Equipment/Devices:None  Discharge Condition:Stable CODE STATUS:Full Diet recommendation: Heart Healthy   Brief/Interim Summary: 33 y.o. male past medical history of HIV with a CD4 count of 10 viral load of 1.2 million on December presents to the emergency room for evaluation of headache and fever reports that it started several days before admission, he started having also photophobia in the ED, in the ED he was found to have a temperature tachycardic, influenza negative UA and chest x-ray were unremarkable.  CBC showed mild leukopenia, CT head negative for any intrcranial abnormalities   Discharge Diagnoses:  Principal Problem:   Fever Active Problems:   AIDS (acquired immune deficiency syndrome) (HCC)   Headache   Aseptic meningitis   Leukopenia   Thrombocytopenia (HCC)   Hyponatremia Headache and fever possibly due to viral syndrome: He was admitted to the hospital CT scan was done that showed no acute findings. Due to the concern of meningitis he was started empirically on IV linezolid amphotericin B and flucytosine. ID was consulted who recommended an LP was performed by IR on 07/21/2019. Showed no signs of meningitis. ID recommended to discontinue antibiotics. He was monitored for 24 hours remained afebrile.  AIDS: CD4 count 10, with a viral load 1.2 million. Continues Azothro and Bactrim for prophylaxis. Cont symtuza.  Leukopenia: Appears to be chronic right likely related to HIV.  Discharge Instructions  Discharge Instructions    Diet - low sodium heart healthy   Complete by:  As directed    Increase activity slowly    Complete by:  As directed      Allergies as of 09/22/2018      Reactions   Amoxicillin Other (See Comments)   From childhood: "I had a reaction when i was little." (??)   Vancomycin Itching   Angioedema      Medication List    STOP taking these medications   cephALEXin 500 MG capsule Commonly known as:  KEFLEX   linezolid 600 MG tablet Commonly known as:  ZYVOX     TAKE these medications   azithromycin 600 MG tablet Commonly known as:  ZITHROMAX Take 2 tablets (1,200 mg total) by mouth once a week. Start taking on:  September 28, 2018 What changed:    how much to take  when to take this   Darunavir-Cobicisctat-Emtricitabine-Tenofovir Alafenamide 800-150-200-10 MG Tabs Commonly known as:  SYMTUZA Take 1 tablet by mouth daily with breakfast.   sulfamethoxazole-trimethoprim 400-80 MG tablet Commonly known as:  BACTRIM Take 1 tablet by mouth daily. What changed:  when to take this      Follow-up Information    [facility] Follow up on 10/04/2018.   Specialty:  Infectious Diseases Why:  2:45 pm appointment with [facility] information: [address] [place], [place] [phone] mc [place] [address] [phone]         Allergies  Allergen Reactions  . Amoxicillin Other (See Comments)    From childhood: "I had a reaction when i was little." (??)  . Vancomycin Itching    Angioedema    Consultations:  ID   Procedures/Studies: Dg Chest 2 View  Result Date: 09/19/2018  CLINICAL DATA:  33 year old male with fever. EXAM: CHEST - 2 VIEW COMPARISON:  Chest radiograph dated 04/26/2018 FINDINGS: The heart size and mediastinal contours are within normal limits. Both lungs are clear. The visualized skeletal structures are unremarkable. IMPRESSION: No active cardiopulmonary disease. Electronically Signed   By: Elgie Collard M.D.   On: 09/19/2018 22:34   Ct Head Wo Contrast  Result Date:  09/20/2018 CLINICAL DATA:  Headache for 3 days. Sinus pressure, photophobia, nausea, runny nose, malaise, fevers. History of cryptococcal infection, syphilis, HIV aids. EXAM: CT HEAD WITHOUT CONTRAST TECHNIQUE: Contiguous axial images were obtained from the base of the skull through the vertex without intravenous contrast. COMPARISON:  04/26/2018 FINDINGS: Brain: Focal encephalomalacia in the left parietal convexity consistent with old infarct and unchanged since prior study. No mass-effect or midline shift. No abnormal extra-axial fluid collections. Gray-white matter junctions are distinct. Basal cisterns are not effaced. No ventricular dilatation. No acute intracranial hemorrhage. Vascular: No hyperdense vessel or unexpected calcification. Skull: Calvarium appears intact. Sinuses/Orbits: Paranasal sinuses and mastoid air cells are clear. Other: No significant changes since previous study. IMPRESSION: No acute intracranial abnormalities. Old left parietal infarct. Electronically Signed   By: Burman Nieves M.D.   On: 09/20/2018 02:18   Dg Fluoro Guide Lumbar Puncture  Result Date: 09/20/2018 CLINICAL DATA:  HIV.  Headache and fever. EXAM: DIAGNOSTIC LUMBAR PUNCTURE UNDER FLUOROSCOPIC GUIDANCE FLUOROSCOPY TIME:  Fluoroscopy Time:  0 minutes 12 second Radiation Exposure Index (if provided by the fluoroscopic device): Number of Acquired Spot Images: 0 PROCEDURE: Informed consent was obtained from the patient prior to the procedure, including potential complications of headache, allergy, and pain. With the patient prone, the lower back was prepped with Betadine. 1% Lidocaine was used for local anesthesia. Lumbar puncture was performed at the L4-5 level using a 22 gauge needle with return of clear CSF with an opening pressure of 18 cm water. Twelve ml of CSF were obtained for laboratory studies. The patient tolerated the procedure well and there were no apparent complications. IMPRESSION: Successful lumbar  puncture using fluoroscopy. Electronically Signed   By: Marlan Palau M.D.   On: 09/20/2018 14:00    (Echo, Carotid, EGD, Colonoscopy, ERCP)    Subjective: No complaints feels great.  Discharge Exam: Vitals:   09/21/18 2039 09/22/18 0507  BP: 111/74 103/67  Pulse: 71 66  Resp: 19 18  Temp: 98.2 F (36.8 C) 97.7 F (36.5 C)  SpO2: 100% 100%     General: Pt is alert, awake, not in acute distress Cardiovascular: RRR, S1/S2 +, no rubs, no gallops Respiratory: CTA bilaterally, no wheezing, no rhonchi Abdominal: Soft, NT, ND, bowel sounds + Extremities: no edema, no cyanosis    The results of significant diagnostics from this hospitalization (including imaging, microbiology, ancillary and laboratory) are listed below for reference.     Microbiology: Recent Results (from the past 240 hour(s))  Blood culture (routine x 2)     Status: None (Preliminary result)   Collection Time: 09/20/18 12:49 AM  Result Value Ref Range Status   Specimen Description BLOOD LEFT ARM  Final   Special Requests   Final    BOTTLES DRAWN AEROBIC AND ANAEROBIC Blood Culture adequate volume   Culture NO GROWTH 1 DAY  Final   Report Status PENDING  Incomplete  Blood culture (routine x 2)     Status: None (Preliminary result)   Collection Time: 09/20/18  1:13 AM  Result Value Ref Range Status   Specimen Description BLOOD RIGHT  ARM  Final   Special Requests   Final    BOTTLES DRAWN AEROBIC AND ANAEROBIC Blood Culture results may not be optimal due to an excessive volume of blood received in culture bottles   Culture NO GROWTH 1 DAY  Final   Report Status PENDING  Incomplete  Urine Culture     Status: None   Collection Time: 09/20/18  5:51 AM  Result Value Ref Range Status   Specimen Description URINE, CLEAN CATCH  Final   Special Requests NONE  Final   Culture   Final    NO GROWTH Performed at [facility], [address], [place], [place] [address]    Report Status 09/21/2018 FINAL   Final  Anaerobic culture     Status: None (Preliminary result)   Collection Time: 09/20/18  1:21 PM  Result Value Ref Range Status   Specimen Description CSF  Final   Special Requests   Final    NONE Performed at [facility], [address], [place], [place] [address]    Culture   Final    NO ANAEROBES ISOLATED; CULTURE IN PROGRESS FOR 5 DAYS   Report Status PENDING  Incomplete  CSF culture     Status: None (Preliminary result)   Collection Time: 09/20/18  1:21 PM  Result Value Ref Range Status   Specimen Description CSF  Final   Special Requests NONE  Final   Gram Stain   Final    CYTOSPIN SMEAR WBC PRESENT, PREDOMINANTLY MONONUCLEAR NO ORGANISMS SEEN    Culture   Final    NO GROWTH 2 DAYS Performed at [facility], [address], [place], [place] [address]    Report Status PENDING  Incomplete  Fungus Culture With Stain     Status: None (Preliminary result)   Collection Time: 09/20/18  1:21 PM  Result Value Ref Range Status   Fungus Stain Final report  Final    Comment: (NOTE) Performed At: [facility] [address] [place], [place] [id number] Jolene Schimke MD [id number]    Fungus (Mycology) Culture PENDING  Incomplete   Fungal Source CSF  Final    Comment: Performed at [facility], [address] [place], [place] [address]  Fungus Culture Result     Status: None   Collection Time: 09/20/18  1:21 PM  Result Value Ref Range Status   Result 1 Comment  Final    Comment: (NOTE) KOH/Calcofluor preparation:  no fungus observed. Performed At: [facility] [address] [place], [place] [id number] Jolene Schimke MD XB:[id number]   MRSA PCR Screening     Status: None   Collection Time: 09/20/18  2:52 PM  Result Value Ref Range Status   MRSA by PCR NEGATIVE NEGATIVE Final    Comment:        The GeneXpert MRSA Assay (FDA approved for NASAL specimens only), is one component of a comprehensive MRSA colonization surveillance  program. It is not intended to diagnose MRSA infection nor to guide or monitor treatment for MRSA infections. Performed at [facility], [address] [place], [place] [address]      Labs: BNP (last 3 results) No results for input(s): BNP in the last 8760 hours. Basic Metabolic Panel: Recent Labs  Lab 09/19/18 2202 09/20/18 0312 09/21/18 0310 09/22/18 0255  NA 133* 134* 135 136  K 3.5 3.7 3.9 3.8  CL 102 99 105 103  CO2 23 27 23  26  GLUCOSE 88 85 113* 129*  BUN CREATININE 0.65 0.67 0.65 0.65  CALCIUM 8.0* 8.0* 7.6* 8.6*  MG  --  2.3 2.3 2.5*   Liver Function Tests: Recent Labs  Lab 09/19/18 2202 09/20/18 0312  AST 47* 53*  ALT 49* 52*  ALKPHOS 66 66  BILITOT 0.3 0.7  PROT 7.6 7.9  ALBUMIN 3.5 3.4*   No results for input(s): LIPASE, AMYLASE in the last 168 hours. No results for input(s): AMMONIA in the last 168 hours. CBC: Recent Labs  Lab 09/19/18 2202 09/20/18 0332 09/21/18 0310 09/22/18 0255  WBC 2.8* 2.6* 2.2* 2.6*  NEUTROABS 1.7 1.6*  --   --   HGB 11.0* 11.1* 11.0* 13.4  HCT 34.0* 34.2* 34.4* 40.1  MCV 96.3 98.6 95.3 94.4  PLT 142* 148* 121* 149*   Cardiac Enzymes: No results for input(s): CKTOTAL, CKMB, CKMBINDEX, TROPONINI in the last 168 hours. BNP: Invalid input(s): POCBNP CBG: No results for input(s): GLUCAP in the last 168 hours. D-Dimer No results for input(s): DDIMER in the last 72 hours. Hgb A1c No results for input(s): HGBA1C in the last 72 hours. Lipid Profile No results for input(s): CHOL, HDL, LDLCALC, TRIG, CHOLHDL, LDLDIRECT in the last 72 hours. Thyroid function studies No results for input(s): TSH, T4TOTAL, T3FREE, THYROIDAB in the last 72 hours.  Invalid input(s): FREET3 Anemia work up No results for input(s): VITAMINB12, FOLATE, FERRITIN, TIBC, IRON, RETICCTPCT in the last 72 hours. Urinalysis    Component Value Date/Time   COLORURINE YELLOW 09/19/2018 2200   APPEARANCEUR CLEAR 09/19/2018 2200    APPEARANCEUR Hazy 07/27/2013 1742   LABSPEC 1.020 09/19/2018 2200   LABSPEC 1.026 07/27/2013 1742   PHURINE 6.0 09/19/2018 2200   GLUCOSEU NEGATIVE 09/19/2018 2200   GLUCOSEU Negative 07/27/2013 1742   HGBUR NEGATIVE 09/19/2018 2200   BILIRUBINUR NEGATIVE 09/19/2018 2200   BILIRUBINUR Negative 07/27/2013 1742   KETONESUR NEGATIVE 09/19/2018 2200   PROTEINUR NEGATIVE 09/19/2018 2200   UROBILINOGEN 1 12/17/2013 1118   NITRITE NEGATIVE 09/19/2018 2200   LEUKOCYTESUR NEGATIVE 09/19/2018 2200   LEUKOCYTESUR Negative 07/27/2013 1742   Sepsis Labs Invalid input(s): PROCALCITONIN,  WBC,  LACTICIDVEN Microbiology Recent Results (from the past 240 hour(s))  Blood culture (routine x 2)     Status: None (Preliminary result)   Collection Time: 09/20/18 12:49 AM  Result Value Ref Range Status   Specimen Description BLOOD LEFT ARM  Final   Special Requests   Final    BOTTLES DRAWN AEROBIC AND ANAEROBIC Blood Culture adequate volume   Culture NO GROWTH 1 DAY  Final   Report Status PENDING  Incomplete  Blood culture (routine x 2)     Status: None (Preliminary result)   Collection Time: 09/20/18  1:13 AM  Result Value Ref Range Status   Specimen Description BLOOD RIGHT ARM  Final   Special Requests   Final    BOTTLES DRAWN AEROBIC AND ANAEROBIC Blood Culture results may not be optimal due to an excessive volume of blood received in culture bottles   Culture NO GROWTH 1 DAY  Final   Report Status PENDING  Incomplete  Urine Culture     Status: None   Collection Time: 09/20/18  5:51 AM  Result Value Ref Range Status   Specimen Description URINE, CLEAN CATCH  Final   Special Requests NONE  Final   Culture   Final    NO GROWTH Performed at [facility], [address]  [place], [place] [address]    Report Status 09/21/2018 FINAL  Final  Anaerobic culture     Status: None (Preliminary result)   Collection Time: 09/20/18  1:21 PM  Result Value Ref Range Status   Specimen Description  CSF  Final   Special Requests   Final    NONE Performed at [facility], [address] [place], [place] [address]    Culture   Final    NO ANAEROBES ISOLATED; CULTURE IN PROGRESS FOR 5 DAYS   Report Status PENDING  Incomplete  CSF culture     Status: None (Preliminary result)   Collection Time: 09/20/18  1:21 PM  Result Value Ref Range Status   Specimen Description CSF  Final   Special Requests NONE  Final   Gram Stain   Final    CYTOSPIN SMEAR WBC PRESENT, PREDOMINANTLY MONONUCLEAR NO ORGANISMS SEEN    Culture   Final    NO GROWTH 2 DAYS Performed at [facility], [address] [place], [place] [address]    Report Status PENDING  Incomplete  Fungus Culture With Stain     Status: None (Preliminary result)   Collection Time: 09/20/18  1:21 PM  Result Value Ref Range Status   Fungus Stain Final report  Final    Comment: (NOTE) Performed At: [facility] [address] [place], [place] [id number] Jolene SchimkeNagendra Sanjai MD VH:[id number]:[phone]    Fungus (Mycology) Culture PENDING  Incomplete   Fungal Source CSF  Final    Comment: Performed at [facility], [address] [place], [place] [address]  Fungus Culture Result     Status: None   Collection Time: 09/20/18  1:21 PM  Result Value Ref Range Status   Result 1 Comment  Final    Comment: (NOTE) KOH/Calcofluor preparation:  no fungus observed. Performed At: [facility]  [address] [place], [place] [id number] Jolene SchimkeNagendra Sanjai MD [id number]:[phone]   MRSA PCR Screening     Status: None   Collection Time: 09/20/18  2:52 PM  Result Value Ref Range Status   MRSA by PCR NEGATIVE NEGATIVE Final    Comment:        The GeneXpert MRSA Assay (FDA approved for NASAL specimens only), is one component of a comprehensive MRSA colonization surveillance program. It is not intended to diagnose MRSA infection nor to guide or monitor treatment for MRSA infections. Performed at [facility], [address] [place], [place] [address]      Time coordinating discharge: 40 minutes  SIGNED:   Marinda ElkAbraham Feliz Ortiz, MD  [facility]

## 2018-09-22 NOTE — Progress Notes (Signed)
Discharge instructions completed with pt. Pt verbalized understanding of the information.  Pt denies chest pain, shortness of breath, dizziness, lightheadedness, and n/v.  Pt's IV's discontinued.  Pt waiting on transportation.

## 2018-09-23 LAB — CSF CULTURE W GRAM STAIN: Culture: NO GROWTH

## 2018-09-23 LAB — CSF CULTURE

## 2018-09-24 LAB — ANAEROBIC CULTURE

## 2018-09-24 LAB — ENTEROVIRUS PCR: Enterovirus PCR: NEGATIVE

## 2018-09-25 LAB — CULTURE, BLOOD (ROUTINE X 2)
Culture: NO GROWTH
Culture: NO GROWTH
Special Requests: ADEQUATE

## 2018-10-04 ENCOUNTER — Other Ambulatory Visit: Payer: Self-pay

## 2018-10-04 ENCOUNTER — Ambulatory Visit (INDEPENDENT_AMBULATORY_CARE_PROVIDER_SITE_OTHER): Payer: Medicaid Other | Admitting: Infectious Diseases

## 2018-10-04 ENCOUNTER — Encounter: Payer: Self-pay | Admitting: Infectious Diseases

## 2018-10-04 VITALS — BP 118/72 | HR 85 | Temp 97.9°F | Ht 73.0 in | Wt 194.0 lb

## 2018-10-04 DIAGNOSIS — R51 Headache: Secondary | ICD-10-CM | POA: Diagnosis not present

## 2018-10-04 DIAGNOSIS — B20 Human immunodeficiency virus [HIV] disease: Secondary | ICD-10-CM | POA: Diagnosis not present

## 2018-10-04 DIAGNOSIS — N410 Acute prostatitis: Secondary | ICD-10-CM | POA: Diagnosis not present

## 2018-10-04 DIAGNOSIS — R519 Headache, unspecified: Secondary | ICD-10-CM

## 2018-10-04 MED ORDER — LEVOFLOXACIN 500 MG PO TABS: 500.0000 mg | ORAL_TABLET | Freq: Every day | ORAL | 0 refills | Status: AC

## 2018-10-04 MED ORDER — SUMATRIPTAN SUCCINATE 25 MG PO TABS: 25.0000 mg | ORAL_TABLET | ORAL | 0 refills | Status: DC | PRN

## 2018-10-04 MED ORDER — ONDANSETRON HCL 4 MG PO TABS: 4.0000 mg | ORAL_TABLET | Freq: Three times a day (TID) | ORAL | 0 refills | Status: DC | PRN

## 2018-10-04 NOTE — Patient Instructions (Addendum)
For your migraines - please take one sumitriptan (Imetrex) once when you pick it up. In 2 hours if this has not calmed down please take another pill.  zofran for your nausea.   Please pick up your Symtuza and your Bactrim and start taking them both once a day.   I want to repeat your imaging on your pelvis to make sure your abscess is not coming back.   Please give [place] a urine sample today.   Will send in an antibiotic to start taking 1 pill one a day called Levaquin.   Please come back in 3 weeks before you leave for [place] so we can help transfer your records and get you with a HIV clinic there asap. I know you are going to see you sister to help but you also still need to take care of yourself first.

## 2018-10-04 NOTE — Progress Notes (Signed)
Per referral coordinator insurance has denied prior authorization for CT of pelvis. Additional clinicals faxed.  Valarie Cones, LPN

## 2018-10-04 NOTE — Progress Notes (Signed)
Name: Jacob Gutierrez  DOB: 12-31-1985 MRN: [id number] PCP: Grayce Sessions, NP    Patient Active Problem List   Diagnosis Date Noted  . Fever 04/28/2018    Priority: High  . Headache 04/27/2018    Priority: High  . AIDS (acquired immune deficiency syndrome) (HCC)     Priority: High  . Prostatitis 10/05/2018  . Leukopenia 09/20/2018  . Thrombocytopenia (HCC) 09/20/2018  . Hyponatremia 09/20/2018  . 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 Condyloma 04/24/2018  . Protein-calorie malnutrition, severe 12/12/2017  . Personal history of MRSA (methicillin resistant Staphylococcus aureus) 12/09/2017  . History of ESBL E. coli infection 12/09/2017  . Homelessness   . 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 Tobacco use disorder 01/02/2014  . Human immunodeficiency virus (HIV) disease (HCC) 01/01/2014     Subjective:  CC:  Hospital follow up. Multiple complaints today - frontal headache that is unrelieved from OTC meds (hx migraines as child), chills, scrotal/perineal pain and difficulty urinating. Nauseated. Has not filled Symtuza.   HPI:  Jacob Gutierrez is here today brought by [place]. He was discharged about 2 weeks ago after admitted for headache and fever. LP was obtained d/t low cd4 and concern for OI - cryptococcal ag negative, no pleocycosis or abnormalities in CSF profile, no growth from any cultures. He has for the last 3 days had a bad headache that is described to be throbbing over the frontal bone. Frequently wakes him up at night. He has taken OTC remedies w/o relief. He did have migraines as a kid. He does not have vision changes but does have photosensitivity and nausea. Still with chills and now over the last week he has noticed perineal pain, difficulty voiding/peeing frequently and heaviness/pain that is causing him to have a hard time walking.   He has not picked up his symtuza, or bactrim since discharge. Says we didn't send in rx.   Review of Systems  All other systems reviewed and  are negative. 12-point ROS completed and otherwise unremarkable  Past Medical History:  Diagnosis Date  . AIDS (acquired immune deficiency syndrome) (HCC)   . AIDS (acquired immune deficiency syndrome) (HCC)   . 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 H/O coccidioidomycosis    pulmonary   . HIV disease (HCC)   . Past history of allergy to penicillin-type antibiotic 07-03-2012   desensitization  . PNA (pneumonia)   . Shigella gastroenteritis   . Syphilis    history /treated     Outpatient Medications Prior to Visit  Medication Sig Dispense Refill  . sulfamethoxazole-trimethoprim (BACTRIM) 400-80 MG tablet Take 1 tablet by mouth daily. (Patient taking differently: Take 1 tablet by mouth 2 (two) times daily. ) 30 tablet 0  . azithromycin (ZITHROMAX) 600 MG tablet Take 2 tablets (1,200 mg total) by mouth once a week. (Patient not taking: Reported on 10/04/2018) 4 tablet 5  . Darunavir-Cobicisctat-Emtricitabine-Tenofovir Alafenamide (SYMTUZA) 800-150-200-10 MG TABS Take 1 tablet by mouth daily with breakfast. (Patient not taking: Reported on 10/04/2018) 30 tablet 5   No facility-administered medications prior to visit.      Allergies  Allergen Reactions  . Amoxicillin Other (See Comments)    From childhood: "I had a reaction when i was little." (??)  . Vancomycin Itching    Angioedema    Social History   Tobacco Use  . Smoking status: Current Every Day Smoker    Packs/day: 0.50    Types: Cigarettes  . Smokeless tobacco:  Never Used  . Tobacco comment: cutting back  Substance Use Topics  . Alcohol use: Yes    Alcohol/week: 1.0 standard drinks    Types: 1 Standard drinks or equivalent per week    Comment: whiskey occasionally   . Drug use: Yes    Frequency: 1.0 times per week    Types: Marijuana    Social History   Substance and Sexual Activity  Sexual Activity Yes  . Partners: Female, Male  . Birth control/protection: Condom      Objective:   Vitals:   10/04/18 1444  BP: 118/72  Pulse: 85  Temp: 97.9 F (36.6 C)  Weight: 194 lb (88 kg)  Height: 6\' 1"  (1.854 m)   Body mass index is 25.6 kg/m.  Physical Exam Constitutional:      Appearance: Normal appearance. He is ill-appearing.     Comments: Appears uncomfortable  Eyes:     General: Lids are normal. Vision grossly intact. No scleral icterus.    Extraocular Movements:     Right eye: Normal extraocular motion and no nystagmus.     Left eye: Normal extraocular motion and no nystagmus.     Pupils: Pupils are equal, round, and reactive to light.     Comments: Photosensitivity  Neck:     Musculoskeletal: Normal range of motion. No neck rigidity.  Cardiovascular:     Rate and Rhythm: Normal rate and regular rhythm.     Pulses: Normal pulses.     Heart sounds: No murmur.  Pulmonary:     Effort: Pulmonary effort is normal.     Breath sounds: Normal breath sounds.  Abdominal:     General: Bowel sounds are normal.     Tenderness: There is no abdominal tenderness.  Genitourinary:    Comments: Perineal tenderness with dysuria providing urine sample Musculoskeletal: Normal range of motion.     Comments: Walks with slight gimp d/t pain in groin  Skin:    General: Skin is warm and dry.     Capillary Refill: Capillary refill takes less than 2 seconds.  Neurological:     General: No focal deficit present.     Mental Status: He is alert and oriented to person, place, and time.    Lab Results Lab Results  Component Value Date   WBC 2.6 (L) 09/22/2018   HGB 13.4 09/22/2018   HCT 40.1 09/22/2018   MCV 94.4 09/22/2018   PLT 149 (L) 09/22/2018    Lab Results  Component Value Date   CREATININE 0.65 09/22/2018   BUN 14 09/22/2018   NA 136 09/22/2018   K 3.8 09/22/2018   CL 103 09/22/2018   CO2 26 09/22/2018    Lab Results  Component Value Date   ALT 52 (H) 09/20/2018   AST 53 (H) 09/20/2018   ALKPHOS 66 09/20/2018   BILITOT 0.7 09/20/2018     Lab Results  Component Value Date   CHOL 126 12/17/2013   HDL 47 12/17/2013   LDLCALC 49 12/17/2013   TRIG 152 (H) 12/17/2013   CHOLHDL 2.7 12/17/2013   HIV 1 RNA Quant (copies/mL)  Date Value  07/20/2018 1,220,000  06/08/2018 95,100  04/24/2018 377,000 (H)   CD4 T Cell Abs (/uL)  Date Value  07/20/2018 10 (L)  04/24/2018 30 (L)  02/02/2018 60 (L)   Assessment & Plan:   Problem List Items Addressed This Visit      High   AIDS (acquired immune deficiency syndrome) (HCC) - Primary  Continue bactrim once a day and azithromycin once a week for OI prophylaxisis. LP and brain CT w/o evidence of OIs. I called his pharmacy and they have been trying to reach him to clarify what medication he is supposed to be on - I asked them to please contact the clinic in the future if they cannot reach the patient and have not filled ARTs. Discussed with Yadriel today. He will go by today and pick up his Symtuza.       Headache    He is still having headaches with features of migraine with associated photosensitivity and nausea. Counseled on abortive therapy with sumitriptan and zofran to treat.       Relevant Medications   SUMAtriptan (IMITREX) 25 MG tablet   ondansetron (ZOFRAN) 4 MG tablet     Unprioritized   Human immunodeficiency virus (HIV) disease (HCC)    Continue symtuza as above. Counseled on medication adherence today. He will need to come back before he plans to leave for [place] in April to coordinate transfer of care and check in one more time with blood work at that visit.       Prostatitis    He has a history of TURP for significant prostate abscess in May 2018 and I am suspicious that he now has prostatitis +/- abscess again which would explain his fevers recently and ongoing chills with these new symptoms.   Will check U/A and culture today as well as GC/C testing. Would like to get him re-scanned to ensure there is not an abscess forming. Will arrange for outpatient CT of  pelvis w/ contrast.   U/A with large leukocytes. Start levaquin, await culture results.       Relevant Medications   levofloxacin (LEVAQUIN) 500 MG tablet   Other Relevant Orders   CT PELVIS W CONTRAST   Urinalysis, Routine w reflex microscopic (Completed)   Urine Culture   Urine cytology ancillary only   MICROSCOPIC MESSAGE (Completed)     I spent greater than 40 minutes with the patient including greater than 70% of time in face to face counsel and coordination of care.   Rexene Alberts, MSN, NP-C [facility] for Infectious Disease [facility] Pager: [phone] Office: [phone]  10/05/18  4:51 PM

## 2018-10-05 DIAGNOSIS — N419 Inflammatory disease of prostate, unspecified: Secondary | ICD-10-CM | POA: Insufficient documentation

## 2018-10-05 LAB — URINALYSIS, ROUTINE W REFLEX MICROSCOPIC
Bacteria, UA: NONE SEEN /HPF
Bilirubin Urine: NEGATIVE
Glucose, UA: NEGATIVE
Hgb urine dipstick: NEGATIVE
Hyaline Cast: NONE SEEN /LPF
Ketones, ur: NEGATIVE
NITRITE: NEGATIVE
RBC / HPF: NONE SEEN /HPF (ref 0–2)
Specific Gravity, Urine: 1.02 (ref 1.001–1.03)
pH: 6 (ref 5.0–8.0)

## 2018-10-05 LAB — URINE CULTURE
MICRO NUMBER:: 312461
Result:: NO GROWTH
SPECIMEN QUALITY:: ADEQUATE

## 2018-10-05 NOTE — Assessment & Plan Note (Signed)
Continue symtuza as above. Counseled on medication adherence today. He will need to come back before he plans to leave for [place] in April to coordinate transfer of care and check in one more time with blood work at that visit.

## 2018-10-05 NOTE — Assessment & Plan Note (Addendum)
Continue bactrim once a day and azithromycin once a week for OI prophylaxisis. LP and brain CT w/o evidence of OIs. I called his pharmacy and they have been trying to reach him to clarify what medication he is supposed to be on - I asked them to please contact the clinic in the future if they cannot reach the patient and have not filled ARTs. Discussed with Oc today. He will go by today and pick up his Symtuza.

## 2018-10-05 NOTE — Assessment & Plan Note (Signed)
He is still having headaches with features of migraine with associated photosensitivity and nausea. Counseled on abortive therapy with sumitriptan and zofran to treat.

## 2018-10-05 NOTE — Assessment & Plan Note (Signed)
He has a history of TURP for significant prostate abscess in May 2018 and I am suspicious that he now has prostatitis +/- abscess again which would explain his fevers recently and ongoing chills with these new symptoms.   Will check U/A and culture today as well as GC/C testing. Would like to get him re-scanned to ensure there is not an abscess forming. Will arrange for outpatient CT of pelvis w/ contrast.   U/A with large leukocytes. Start levaquin, await culture results.

## 2018-10-10 ENCOUNTER — Ambulatory Visit (HOSPITAL_COMMUNITY): Admission: RE | Admit: 2018-10-10 | Payer: Medicaid Other | Source: Ambulatory Visit

## 2018-10-20 LAB — FUNGUS CULTURE WITH STAIN

## 2018-10-20 LAB — FUNGAL ORGANISM REFLEX

## 2018-10-20 LAB — FUNGUS CULTURE RESULT

## 2018-10-22 ENCOUNTER — Encounter: Payer: Self-pay | Admitting: Primary Care

## 2018-10-24 ENCOUNTER — Other Ambulatory Visit: Payer: Self-pay

## 2018-10-24 ENCOUNTER — Encounter (INDEPENDENT_AMBULATORY_CARE_PROVIDER_SITE_OTHER): Payer: Self-pay

## 2018-10-24 ENCOUNTER — Encounter: Payer: Self-pay | Admitting: Infectious Diseases

## 2018-10-24 ENCOUNTER — Ambulatory Visit (INDEPENDENT_AMBULATORY_CARE_PROVIDER_SITE_OTHER): Payer: Medicaid Other | Admitting: Infectious Diseases

## 2018-10-24 VITALS — BP 117/75 | HR 82 | Temp 98.1°F | Ht 73.0 in | Wt 200.0 lb

## 2018-10-24 DIAGNOSIS — B20 Human immunodeficiency virus [HIV] disease: Secondary | ICD-10-CM

## 2018-10-24 DIAGNOSIS — K6282 Dysplasia of anus: Secondary | ICD-10-CM | POA: Diagnosis not present

## 2018-10-24 DIAGNOSIS — Z5900 Homelessness unspecified: Secondary | ICD-10-CM

## 2018-10-24 DIAGNOSIS — N419 Inflammatory disease of prostate, unspecified: Secondary | ICD-10-CM | POA: Diagnosis not present

## 2018-10-24 DIAGNOSIS — A630 Anogenital (venereal) warts: Secondary | ICD-10-CM | POA: Diagnosis not present

## 2018-10-24 MED ORDER — AZITHROMYCIN 600 MG PO TABS: 1200.0000 mg | ORAL_TABLET | ORAL | 5 refills | Status: DC

## 2018-10-24 MED ORDER — SULFAMETHOXAZOLE-TRIMETHOPRIM 400-80 MG PO TABS: 1.0000 | ORAL_TABLET | Freq: Every day | ORAL | 0 refills | Status: DC

## 2018-10-24 MED ORDER — DARUN-COBIC-EMTRICIT-TENOFAF 800-150-200-10 MG PO TABS: 1.0000 | ORAL_TABLET | Freq: Every day | ORAL | 5 refills | Status: DC

## 2018-10-24 MED ORDER — IMIQUIMOD 5 % EX CREA: TOPICAL_CREAM | CUTANEOUS | 2 refills | Status: DC

## 2018-10-24 NOTE — Progress Notes (Signed)
Name: Jacob Gutierrez  DOB: 08/13/1985 MRN: [id number] PCP: Grayce Sessions, NP    Patient Active Problem List   Diagnosis Date Noted  . Headache 04/27/2018    Priority: High  . AIDS (acquired immune deficiency syndrome) (HCC)     Priority: High  . Prostatitis 10/05/2018  . Leukopenia 09/20/2018  . Thrombocytopenia (HCC) 09/20/2018  . Hyponatremia 09/20/2018  . 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 Condyloma 04/24/2018  . Protein-calorie malnutrition, severe 12/12/2017  . Personal history of MRSA (methicillin resistant Staphylococcus aureus) 12/09/2017  . History of ESBL E. coli infection 12/09/2017  . Homelessness   . 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 Tobacco use disorder 01/02/2014  . Human immunodeficiency virus (HIV) disease (HCC) 01/01/2014     Subjective:   Chief Complaint  Patient presents with  . Follow-up    took Bactrim DS 1 tablet daily + Bactrim SS 1 tablet daily, has not been taking azithromycin (didn't receive)    HPI:  Jacob Gutierrez is here for 3-week follow-up.  At the last visit he was endorsing scrotal heaviness, pain, dysuria.  He has 1 more dose of Levaquin left to complete 21-day course for presumed prostatitis.  Most of his symptoms have improved however he still has nocturia/frequency.  He has not seen his urologist since 2018 when he had his TURP. His follow up CT scan is scheduled for April 22nd. He reported these complaints immediately after surgery for his prostate abscess but they seem to have re-emerged. Urine culture 3w ago revealed no growth. He still has trouble with peri-anal warts. Would like surgical evaluation if possible for removal.   His move to [place] to see his sister and nephew is on hold currently with COVID-19 outbreak. He is uncomfortable traveling and being in airports. He is again unemployed with job cuts from [employer] where he previously was working. His mother is sending him some money every once in a while in between SSI checks. He  is staying at a shelter at the moment. Trying his best to wash hands often (has hand sanitizer in his bag). In general he stays away from other residents but does play cards with some friends often.   He tells me that he is feeling a lot better - hasn't felt this good in a long time. He has regularly been taking his Symtuza once a day and estimates maybe only 1-2 missed doses. He has two prescriptions for bactrim - one SS and one DS and taking one of each a day. Has not picked up his Azithromycin. He has noticed improvement in his energy level, appetite and has picked up some weight which he is happy about. He tells me that he used to be pretty big (320lbs) at one point in the past and hopeful to not get that big again.     Review of Systems  Constitutional: Negative for chills, fever, malaise/fatigue and weight loss.  HENT: Negative for sore throat.        No dental problems  Respiratory: Negative for cough and sputum production.   Cardiovascular: Negative for chest pain and leg swelling.  Gastrointestinal: Negative for abdominal pain, diarrhea and vomiting.  Genitourinary: Positive for frequency. Negative for dysuria and flank pain.       Nocturia   Musculoskeletal: Negative for joint pain, myalgias and neck pain.  Skin: Negative for rash.  Neurological: Negative for dizziness, tingling and headaches.  Psychiatric/Behavioral: Negative for depression and substance abuse. The patient is not nervous/anxious and  does not have insomnia.     Past Medical History:  Diagnosis Date  . AIDS (acquired immune deficiency syndrome) (HCC)   . AIDS (acquired immune deficiency syndrome) (HCC)   . 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 H/O coccidioidomycosis    pulmonary   . HIV disease (HCC)   . Past history of allergy to penicillin-type antibiotic 07-03-2012   desensitization  . PNA (pneumonia)   . Shigella gastroenteritis   . Syphilis    history /treated      Outpatient Medications Prior to Visit  Medication Sig Dispense Refill  . levofloxacin (LEVAQUIN) 500 MG tablet Take 1 tablet (500 mg total) by mouth daily for 21 days. 21 tablet 0  . ondansetron (ZOFRAN) 4 MG tablet Take 1 tablet (4 mg total) by mouth every 8 (eight) hours as needed for nausea or vomiting. 20 tablet 0  . SUMAtriptan (IMITREX) 25 MG tablet Take 1 tablet (25 mg total) by mouth every 2 (two) hours as needed for migraine. May repeat in 2 hours if headache persists or recurs. 10 tablet 0  . Darunavir-Cobicisctat-Emtricitabine-Tenofovir Alafenamide (SYMTUZA) 800-150-200-10 MG TABS Take 1 tablet by mouth daily with breakfast. 30 tablet 5  . sulfamethoxazole-trimethoprim (BACTRIM) 400-80 MG tablet Take 1 tablet by mouth daily. (Patient taking differently: Take 1 tablet by mouth 2 (two) times daily. ) 30 tablet 0  . azithromycin (ZITHROMAX) 600 MG tablet Take 2 tablets (1,200 mg total) by mouth once a week. (Patient not taking: Reported on 10/04/2018) 4 tablet 5   No facility-administered medications prior to visit.      Allergies  Allergen Reactions  . Amoxicillin Other (See Comments)    From childhood: "I had a reaction when i was little." (??)  . Vancomycin Itching    Angioedema    Social History   Tobacco Use  . Smoking status: Current Every Day Smoker    Packs/day: 0.30    Types: Cigarettes  . Smokeless tobacco: Never Used  . Tobacco comment: cutting back  Substance Use Topics  . Alcohol use: Yes    Alcohol/week: 1.0 standard drinks    Types: 1 Standard drinks or equivalent per week    Comment: whiskey occasionally   . Drug use: Yes    Frequency: 1.0 times per week    Types: Marijuana    Social History   Substance and Sexual Activity  Sexual Activity Yes  . Partners: Female, Male  . Birth control/protection: Condom   Comment: "I have plenty of condoms, need lube"     Objective:   Vitals:   10/24/18 1439  BP: 117/75  Pulse: 82  Temp: 98.1 F (36.7  C)  TempSrc: Oral  Weight: 200 lb (90.7 kg)  Height:  (1.854 m)   Body mass index is 26.39 kg/m.  Physical Exam Constitutional:      Appearance: Normal appearance. He is ill-appearing.     Comments: Lipodystrophy, lipoatrophy of the face.  Seated comfortably in the chair, smiling and well-appearing today.   HENT:     Mouth/Throat:     Mouth: Mucous membranes are moist.     Pharynx: Oropharynx is clear.  Eyes:     General: Lids are normal. Vision grossly intact. No scleral icterus.    Extraocular Movements:     Right eye: Normal extraocular motion and no nystagmus.     Left eye: Normal extraocular motion and no nystagmus.     Pupils: Pupils are equal, round,  and reactive to light.     Comments: Photosensitivity  Neck:     Musculoskeletal: Normal range of motion. No neck rigidity.  Cardiovascular:     Rate and Rhythm: Normal rate and regular rhythm.     Pulses: Normal pulses.     Heart sounds: No murmur.  Pulmonary:     Effort: Pulmonary effort is normal.     Breath sounds: Normal breath sounds.  Abdominal:     General: Bowel sounds are normal.     Tenderness: There is no abdominal tenderness.  Genitourinary:    Comments: Perineal tenderness with dysuria providing urine sample Musculoskeletal: Normal range of motion.     Comments: Walks with slight gimp d/t pain in groin  Skin:    General: Skin is warm and dry.     Capillary Refill: Capillary refill takes less than 2 seconds.  Neurological:     General: No focal deficit present.     Mental Status: He is alert and oriented to person, place, and time.  Psychiatric:     Comments: Appears happy today. Open with me and lights up with discussing his nephew.     Lab Results Lab Results  Component Value Date   WBC 2.6 (L) 09/22/2018   HGB 13.4 09/22/2018   HCT 40.1 09/22/2018   MCV 94.4 09/22/2018   PLT 149 (L) 09/22/2018    Lab Results  Component Value Date   CREATININE 0.65 09/22/2018   BUN 14 09/22/2018    NA 136 09/22/2018   K 3.8 09/22/2018   CL 103 09/22/2018   CO2 26 09/22/2018    Lab Results  Component Value Date   ALT 52 (H) 09/20/2018   AST 53 (H) 09/20/2018   ALKPHOS 66 09/20/2018   BILITOT 0.7 09/20/2018    Lab Results  Component Value Date   CHOL 126 12/17/2013   HDL 47 12/17/2013   LDLCALC 49 12/17/2013   TRIG 152 (H) 12/17/2013   CHOLHDL 2.7 12/17/2013   HIV 1 RNA Quant (copies/mL)  Date Value  07/20/2018 1,220,000  06/08/2018 95,100  04/24/2018 377,000 (H)   CD4 T Cell Abs (/uL)  Date Value  10/24/2018 170 (L)  07/20/2018 10 (L)  04/24/2018 30 (L)   Assessment & Plan:   Problem List Items Addressed This Visit      High   AIDS (acquired immune deficiency syndrome) (HCC) - Primary    I clarified the regimen he should be on for OI proph - one SS Bactrim daily, 1200 mg of azithromycin once a week.  He has no signs of any opportunistic infections on exam today.  We will repeat his CD4 count today.  He will be on both medications likely for 3 months, may be able to discontinue the azithromycin sooner.  Return in about 2 months (around 12/24/2018) for follow up.       Relevant Medications   sulfamethoxazole-trimethoprim (BACTRIM) 400-80 MG tablet   Darunavir-Cobicisctat-Emtricitabine-Tenofovir Alafenamide (SYMTUZA) 800-150-200-10 MG TABS   azithromycin (ZITHROMAX) 600 MG tablet   imiquimod (ALDARA) 5 % cream     Unprioritized   Homelessness (Chronic)    Currently staying in a shelter.  We discussed recommendations regarding COVID-19 prevention today.  I am glad to see he has some hand sanitizer with him and his acknowledging he is at higher risk with his immune system.  He is working with Paramedic, Licensed conveyancer. No immunizations on file for hep A.  With homelessness we will assess antibody and vaccinate as  indicated.      Relevant Orders   Hepatitis A Ab, Total (Completed)   Human immunodeficiency virus (HIV) disease (HCC)    We will repeat his viral load  today as well.  He will continue his Symtuza once daily with food.  He is actually done quite well since picking up his medications.  I am hopeful that he will continue this good work.  We helped him set up his MyChart account today so he will be able to stay engaged in his health care.      Relevant Medications   sulfamethoxazole-trimethoprim (BACTRIM) 400-80 MG tablet   Darunavir-Cobicisctat-Emtricitabine-Tenofovir Alafenamide (SYMTUZA) 800-150-200-10 MG TABS   azithromycin (ZITHROMAX) 600 MG tablet   imiquimod (ALDARA) 5 % cream   Other Relevant Orders   HIV-1 RNA quant-no reflex-bld   T-helper cell (CD4)- ([facility] clinic only) (Completed)   Dysplasia of anus    Complaint of perianal warts.  I will prescribe him Aldara cream today, ambulatory referral to general surgery.  I am not certain when this will happen and informed him it will likely be a few months before they see him.  Instructions provided on Aldara cream.      Relevant Orders   Ambulatory referral to General Surgery   Condyloma    Aldara cream as above.      Relevant Medications   imiquimod (ALDARA) 5 % cream   Other Relevant Orders   Ambulatory referral to General Surgery   Prostatitis    Symptoms are improved.  He did not have any positive bacteria on urine culture 3 weeks ago. STIs negative.  He will complete his last dose of Levaquin tomorrow.  Will check TSH with ongoing complaints.  His CT scan is still 3 weeks away.  I am not certain that we will need it, will have him go back to urology to evaluate his nocturia and frequency.  He may be a good candidate for some Flomax.      Relevant Orders   PSA (Completed)    Other Visit Diagnoses    Anogenital warts       Relevant Medications   sulfamethoxazole-trimethoprim (BACTRIM) 400-80 MG tablet   Darunavir-Cobicisctat-Emtricitabine-Tenofovir Alafenamide (SYMTUZA) 800-150-200-10 MG TABS   azithromycin (ZITHROMAX) 600 MG tablet   imiquimod (ALDARA) 5 % cream      Return in about 2 months (around 12/24/2018) for follow up.   Rexene Alberts, MSN, NP-C [facility] for Infectious Disease [facility] Pager: [phone] Main Office: [phone]

## 2018-10-24 NOTE — Patient Instructions (Addendum)
So glad to see that you are feeling better today.  Please continue taking your Symtuza once a day with food, Bactrim once a day, azithromycin 2 pills once a week.  We will work to get you in to urology for a follow-up about your urination trouble.  Please come back in 2 months so we can check in again.  Your CT scan is scheduled for April 22 at [facility] radiology at 3:00 PM

## 2018-10-25 LAB — T-HELPER CELL (CD4) - (RCID CLINIC ONLY)
CD4 % Helper T Cell: 13 % — ABNORMAL LOW (ref 33–55)
CD4 T Cell Abs: 170 /uL — ABNORMAL LOW (ref 400–2700)

## 2018-10-25 MED FILL — IMIQUIMOD 5 % CREA: 5 | 28 days supply | Qty: 12 | Fill #0

## 2018-10-25 MED FILL — SULFAMETHOXAZOLE-TMP SS TAB: 400-80 | 30 days supply | Qty: 30 | Fill #0

## 2018-10-26 MED FILL — SYMTUZA 800-150-200-10 MG T: 800-150-200 | 30 days supply | Qty: 30 | Fill #0

## 2018-10-27 NOTE — Assessment & Plan Note (Addendum)
Symptoms are improved.  He did not have any positive bacteria on urine culture 3 weeks ago. STIs negative.  He will complete his last dose of Levaquin tomorrow.  Will check TSH with ongoing complaints.  His CT scan is still 3 weeks away.  I am not certain that we will need it, will have him go back to urology to evaluate his nocturia and frequency.  He may be a good candidate for some Flomax.

## 2018-10-27 NOTE — Assessment & Plan Note (Signed)
Complaint of perianal warts.  I will prescribe him Aldara cream today, ambulatory referral to general surgery.  I am not certain when this will happen and informed him it will likely be a few months before they see him.  Instructions provided on Aldara cream.

## 2018-10-27 NOTE — Assessment & Plan Note (Addendum)
Currently staying in a shelter.  We discussed recommendations regarding COVID-19 prevention today.  I am glad to see he has some hand sanitizer with him and his acknowledging he is at higher risk with his immune system.  He is working with Paramedic, Licensed conveyancer. No immunizations on file for hep A.  With homelessness we will assess antibody and vaccinate as indicated.

## 2018-10-27 NOTE — Assessment & Plan Note (Addendum)
I clarified the regimen he should be on for OI proph - one SS Bactrim daily, 1200 mg of azithromycin once a week.  He has no signs of any opportunistic infections on exam today.  We will repeat his CD4 count today.  He will be on both medications likely for 3 months, may be able to discontinue the azithromycin sooner.  Return in about 2 months (around 12/24/2018) for follow up.

## 2018-10-27 NOTE — Assessment & Plan Note (Signed)
We will repeat his viral load today as well.  He will continue his Symtuza once daily with food.  He is actually done quite well since picking up his medications.  I am hopeful that he will continue this good work.  We helped him set up his MyChart account today so he will be able to stay engaged in his health care.

## 2018-10-27 NOTE — Assessment & Plan Note (Signed)
Aldara cream as above.

## 2018-11-02 LAB — HEPATITIS A ANTIBODY, TOTAL: Hepatitis A AB,Total: NONREACTIVE

## 2018-11-02 LAB — PSA: PSA: 0.3 ng/mL (ref <=4.0)

## 2018-11-02 LAB — HIV-1 RNA QUANT-NO REFLEX-BLD
HIV 1 RNA Quant: 1850 copies/mL — ABNORMAL HIGH
HIV-1 RNA Quant, Log: 3.27 Log copies/mL — ABNORMAL HIGH

## 2018-11-05 ENCOUNTER — Other Ambulatory Visit: Payer: Self-pay

## 2018-11-05 DIAGNOSIS — B20 Human immunodeficiency virus [HIV] disease: Secondary | ICD-10-CM

## 2018-11-05 MED ORDER — SULFAMETHOXAZOLE-TRIMETHOPRIM 400-80 MG PO TABS: 1.0000 | ORAL_TABLET | Freq: Every day | ORAL | 2 refills | Status: DC

## 2018-11-14 ENCOUNTER — Ambulatory Visit (HOSPITAL_COMMUNITY): Payer: Medicaid Other

## 2018-11-20 MED FILL — SYMTUZA 800-150-200-10 MG T: 800-150-200 | 30 days supply | Qty: 30 | Fill #1

## 2018-12-05 MED FILL — IMIQUIMOD 5 % CREA: 5 | 28 days supply | Qty: 12 | Fill #1

## 2018-12-05 MED FILL — SULFAMETHOXAZOLE-TMP SS TAB: 400-80 | 30 days supply | Qty: 30 | Fill #0

## 2018-12-05 MED FILL — SYMTUZA 800-150-200-10 MG T: 800-150-200 | 30 days supply | Qty: 30 | Fill #1

## 2018-12-18 ENCOUNTER — Telehealth: Payer: Self-pay | Admitting: Pharmacy Technician

## 2018-12-18 NOTE — Telephone Encounter (Addendum)
Patient's medication was couriered to [facility] from [facility].    Patient stated he would pick up 12/06/2018.  He did not pick up on that date.  Three call attempts were made and he picked up 12/19/2018  Kathie Rhodes E. Dimas Aguas CPhT [facility] Patient [facility] for Infectious Disease Phone: [phone] Fax:  [phone]

## 2019-01-15 MED FILL — SULFAMETHOXAZOLE-TMP SS TAB: 400-80 | 30 days supply | Qty: 30 | Fill #1

## 2019-01-15 MED FILL — SYMTUZA 800-150-200-10 MG T: 800-150-200 | 30 days supply | Qty: 30 | Fill #2

## 2019-01-15 MED FILL — IMIQUIMOD 5 % CREA: 5 | 28 days supply | Qty: 12 | Fill #2

## 2019-01-16 ENCOUNTER — Telehealth: Payer: Self-pay | Admitting: Pharmacy Technician

## 2019-01-16 NOTE — Telephone Encounter (Signed)
[facility] Patient Advocate Encounter  Patient's HIV medications have been couriered to [facility] from [facility] and are ready for pick up.  Voicemails have been left on both phone numbers asking him to call.  When he does we can let him know and ask when he will pick up.  Venida Jarvis. Nadara Mustard [place] Patient [facility] for Infectious Disease Phone: [phone] Fax:  [phone]

## 2019-02-13 ENCOUNTER — Emergency Department (HOSPITAL_COMMUNITY): Payer: Medicaid Other

## 2019-02-13 ENCOUNTER — Inpatient Hospital Stay (HOSPITAL_COMMUNITY)
Admission: EM | Admit: 2019-02-13 | Discharge: 2019-02-15 | DRG: 690 | Disposition: A | Discharge status: Home / Self Care | Payer: Medicaid Other | Attending: Family Medicine | Admitting: Family Medicine

## 2019-02-13 ENCOUNTER — Encounter (HOSPITAL_COMMUNITY): Payer: Self-pay

## 2019-02-13 DIAGNOSIS — Z87448 Personal history of other diseases of urinary system: Secondary | ICD-10-CM | POA: Diagnosis not present

## 2019-02-13 DIAGNOSIS — Z20828 Contact with and (suspected) exposure to other viral communicable diseases: Secondary | ICD-10-CM | POA: Diagnosis present

## 2019-02-13 DIAGNOSIS — Z590 Homelessness: Secondary | ICD-10-CM | POA: Diagnosis not present

## 2019-02-13 DIAGNOSIS — B9620 Unspecified Escherichia coli [E. coli] as the cause of diseases classified elsewhere: Secondary | ICD-10-CM | POA: Diagnosis present

## 2019-02-13 DIAGNOSIS — D649 Anemia, unspecified: Secondary | ICD-10-CM | POA: Diagnosis present

## 2019-02-13 DIAGNOSIS — Z9079 Acquired absence of other genital organ(s): Secondary | ICD-10-CM

## 2019-02-13 DIAGNOSIS — Z87440 Personal history of urinary (tract) infections: Secondary | ICD-10-CM

## 2019-02-13 DIAGNOSIS — F1290 Cannabis use, unspecified, uncomplicated: Secondary | ICD-10-CM | POA: Diagnosis present

## 2019-02-13 DIAGNOSIS — Z79899 Other long term (current) drug therapy: Secondary | ICD-10-CM

## 2019-02-13 DIAGNOSIS — Z881 Allergy status to other antibiotic agents status: Secondary | ICD-10-CM

## 2019-02-13 DIAGNOSIS — N12 Tubulo-interstitial nephritis, not specified as acute or chronic: Secondary | ICD-10-CM | POA: Diagnosis not present

## 2019-02-13 DIAGNOSIS — N10 Acute tubulo-interstitial nephritis: Secondary | ICD-10-CM | POA: Diagnosis present

## 2019-02-13 DIAGNOSIS — B20 Human immunodeficiency virus [HIV] disease: Secondary | ICD-10-CM

## 2019-02-13 DIAGNOSIS — A539 Syphilis, unspecified: Secondary | ICD-10-CM | POA: Diagnosis present

## 2019-02-13 DIAGNOSIS — Z880 Allergy status to penicillin: Secondary | ICD-10-CM

## 2019-02-13 DIAGNOSIS — Z7251 High risk heterosexual behavior: Secondary | ICD-10-CM | POA: Diagnosis not present

## 2019-02-13 DIAGNOSIS — Z9119 Patient's noncompliance with other medical treatment and regimen: Secondary | ICD-10-CM | POA: Diagnosis not present

## 2019-02-13 DIAGNOSIS — B382 Pulmonary coccidioidomycosis, unspecified: Secondary | ICD-10-CM | POA: Diagnosis not present

## 2019-02-13 DIAGNOSIS — F17210 Nicotine dependence, cigarettes, uncomplicated: Secondary | ICD-10-CM | POA: Diagnosis present

## 2019-02-13 DIAGNOSIS — Z8614 Personal history of Methicillin resistant Staphylococcus aureus infection: Secondary | ICD-10-CM

## 2019-02-13 DIAGNOSIS — R300 Dysuria: Secondary | ICD-10-CM | POA: Diagnosis not present

## 2019-02-13 DIAGNOSIS — Z9114 Patient's other noncompliance with medication regimen: Secondary | ICD-10-CM | POA: Diagnosis not present

## 2019-02-13 DIAGNOSIS — R197 Diarrhea, unspecified: Secondary | ICD-10-CM | POA: Diagnosis not present

## 2019-02-13 LAB — URINALYSIS, ROUTINE W REFLEX MICROSCOPIC
Bilirubin Urine: NEGATIVE
Glucose, UA: NEGATIVE mg/dL
Ketones, ur: NEGATIVE mg/dL
Nitrite: NEGATIVE
Protein, ur: 100 mg/dL — AB
Specific Gravity, Urine: 1.015 (ref 1.005–1.030)
WBC, UA: >50 WBC/hpf — ABNORMAL HIGH (ref 0–5)
pH: 6 (ref 5.0–8.0)

## 2019-02-13 LAB — CBC
HCT: 35.3 % — ABNORMAL LOW (ref 39.0–52.0)
Hemoglobin: 11.5 g/dL — ABNORMAL LOW (ref 13.0–17.0)
MCH: 30.7 pg (ref 26.0–34.0)
MCHC: 32.6 g/dL (ref 30.0–36.0)
MCV: 94.1 fL (ref 80.0–100.0)
Platelets: 189 10*3/uL (ref 150–400)
RBC: 3.75 MIL/uL — ABNORMAL LOW (ref 4.22–5.81)
RDW: 14.2 % (ref 11.5–15.5)
WBC: 5.3 10*3/uL (ref 4.0–10.5)
nRBC: 0 % (ref 0.0–0.2)

## 2019-02-13 LAB — BASIC METABOLIC PANEL
Anion gap: 9 (ref 5–15)
BUN: 10 mg/dL (ref 6–20)
CO2: 21 mmol/L — ABNORMAL LOW (ref 22–32)
Calcium: 8.4 mg/dL — ABNORMAL LOW (ref 8.9–10.3)
Chloride: 101 mmol/L (ref 98–111)
Creatinine, Ser: 0.93 mg/dL (ref 0.61–1.24)
GFR calc Af Amer: >60 mL/min (ref >60)
GFR calc non Af Amer: >60 mL/min (ref >60)
Glucose, Bld: 100 mg/dL — ABNORMAL HIGH (ref 70–99)
Potassium: 3.3 mmol/L — ABNORMAL LOW (ref 3.5–5.1)
Sodium: 131 mmol/L — ABNORMAL LOW (ref 135–145)

## 2019-02-13 LAB — LACTIC ACID, PLASMA
Lactic Acid, Venous: 1.8 mmol/L (ref 0.5–1.9)
Lactic Acid, Venous: 1 mmol/L (ref 0.5–1.9)

## 2019-02-13 LAB — ACETAMINOPHEN LEVEL: Acetaminophen (Tylenol), Serum: <10 ug/mL — ABNORMAL LOW (ref 10–30)

## 2019-02-13 LAB — SARS CORONAVIRUS 2 BY RT PCR (HOSPITAL ORDER, PERFORMED IN ~~LOC~~ HOSPITAL LAB): SARS Coronavirus 2: NEGATIVE

## 2019-02-13 MED ORDER — AZITHROMYCIN 600 MG PO TABS
1200.0000 mg | ORAL_TABLET | ORAL | Status: DC
  Administered 2019-02-13: 1200 mg via ORAL
  Filled 2019-02-13: qty 2

## 2019-02-13 MED ORDER — ACETAMINOPHEN 650 MG RE SUPP: 650.0000 mg | Freq: Four times a day (QID) | RECTAL | Status: DC | PRN

## 2019-02-13 MED ORDER — KETOROLAC TROMETHAMINE 15 MG/ML IJ SOLN
15.0000 mg | Freq: Once | INTRAMUSCULAR | Status: AC
  Administered 2019-02-13: 15 mg via INTRAVENOUS
  Filled 2019-02-13: qty 1

## 2019-02-13 MED ORDER — PIPERACILLIN-TAZOBACTAM 3.375 G IVPB
3.3750 g | Freq: Three times a day (TID) | INTRAVENOUS | Status: DC
  Administered 2019-02-13 – 2019-02-14 (×3): 3.375 g via INTRAVENOUS
  Filled 2019-02-13 (×3): qty 50

## 2019-02-13 MED ORDER — SULFAMETHOXAZOLE-TRIMETHOPRIM 400-80 MG PO TABS
1.0000 | ORAL_TABLET | Freq: Every day | ORAL | Status: DC
  Administered 2019-02-13 – 2019-02-15 (×3): 1 via ORAL
  Filled 2019-02-13 (×3): qty 1

## 2019-02-13 MED ORDER — ACETAMINOPHEN 325 MG PO TABS
650.0000 mg | ORAL_TABLET | Freq: Four times a day (QID) | ORAL | Status: DC | PRN
  Filled 2019-02-13: qty 2

## 2019-02-13 MED ORDER — SODIUM CHLORIDE 0.9 % IV BOLUS
1000.0000 mL | Freq: Once | INTRAVENOUS | Status: AC
  Administered 2019-02-13: 1000 mL via INTRAVENOUS

## 2019-02-13 MED ORDER — ACETAMINOPHEN 500 MG PO TABS
1000.0000 mg | ORAL_TABLET | Freq: Once | ORAL | Status: AC
  Administered 2019-02-13: 16:00:00 1000 mg via ORAL
  Filled 2019-02-13: qty 2

## 2019-02-13 MED ORDER — SODIUM CHLORIDE 0.9 % IV SOLN
INTRAVENOUS | Status: DC
  Administered 2019-02-13: 22:00:00 via INTRAVENOUS

## 2019-02-13 MED ORDER — IOHEXOL 300 MG/ML  SOLN
100.0000 mL | Freq: Once | INTRAMUSCULAR | Status: AC | PRN
  Administered 2019-02-13: 100 mL via INTRAVENOUS

## 2019-02-13 MED ORDER — PIPERACILLIN-TAZOBACTAM 3.375 G IVPB 30 MIN
3.3750 g | Freq: Once | INTRAVENOUS | Status: AC
  Administered 2019-02-13: 3.375 g via INTRAVENOUS
  Filled 2019-02-13: qty 50

## 2019-02-13 MED ORDER — ENOXAPARIN SODIUM 40 MG/0.4ML ~~LOC~~ SOLN
40.0000 mg | SUBCUTANEOUS | Status: DC
  Administered 2019-02-13 – 2019-02-14 (×2): 40 mg via SUBCUTANEOUS
  Filled 2019-02-13 (×2): qty 0.4

## 2019-02-13 NOTE — Progress Notes (Signed)
Pharmacy Antibiotic Note  Jacob Gutierrez is a 33 y.o. male admitted on 02/13/2019 with UTI. Pt has hx of HIV/AIDS. Pt has not been compliant w/ tx, but expresses willingness to restart his tx. Tmax 99.6 and WBC 5.3. Pharmacy has been consulted for Zosyn dosing.  Plan: Zosyn 3.375g IV x1 (30 minute infusion) then zosyn 3.375g IV q8h (extended infusion) Monitor renal function and clinical progression F/u C&S  Height: 6\' 1"  (185.4 cm) Weight: 205 lb (93 kg) IBW/kg (Calculated) : 79.9  Temp (24hrs), Avg:100.7 F (38.2 C), Min:99.6 F (37.6 C), Max:101.7 F (38.7 C)  Recent Labs  Lab 02/13/19 1448 02/13/19 1521  WBC 5.3  --   CREATININE 0.93  --   LATICACIDVEN  --  1.8    Estimated Creatinine Clearance: 125.3 mL/min (by C-G formula based on SCr of 0.93 mg/dL).    Allergies  Allergen Reactions  . Vancomycin Itching, Swelling and Other (See Comments)    Angioedema, also    Antimicrobials this admission: Zosyn 7/22 >> Zithromax and Bactrim for prophylaxis  Dose adjustments this admission: n/a  Microbiology results: Pending  Thank you for allowing pharmacy to be a part of this patient's care.  Natale Lay 02/13/2019 7:31 PM

## 2019-02-13 NOTE — ED Provider Notes (Signed)
[facility] Provider Note   CSN: [id number] Arrival date & time: 02/13/19  1415    History   Chief Complaint Chief Complaint  Patient presents with   Fever   Dysuria    HPI Jacob Gutierrez is a 33 y.o. male.     33 yo M with a chief complaints of fever and bilateral flank pain.  Going on for the past few days.  The patient unfortunately has a significant past medical history for HIV and at one point his CD4 count had dropped down below 10.  Last CD4 counts was less than 200.  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  Patient had pain that he felt was similar to his prostate about a month ago but that is resolved.  No overt dysuria gross frequency or hesitancy but having some significant flank pain and fevers as high as 103 at home.  Having some mild shortness of breath with it pain gets worse with deep breath.  The history is provided by the patient.  Fever Associated symptoms: cough and dysuria   Associated symptoms: no chest pain, no chills, no confusion, no congestion, no diarrhea, no headaches, no myalgias, no rash and no vomiting   Dysuria Presenting symptoms: dysuria   Associated symptoms: fever   Associated symptoms: no abdominal pain, no diarrhea and no vomiting   Illness Severity:  Moderate Onset quality:  Gradual Duration:  2 days Timing:  Constant Progression:  Worsening Chronicity:  Recurrent Associated symptoms: cough, fatigue and fever   Associated symptoms: no abdominal pain, no chest pain, no congestion, no diarrhea, no headaches, no myalgias, no rash, no shortness of breath and no vomiting     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 [facility](HCC)      H/O coccidioidomycosis    pulmonary    HIV disease (HCC)    Past history of allergy to penicillin-type antibiotic 07-03-2012   desensitization   PNA (pneumonia)    Shigella gastroenteritis    Syphilis    history /treated     Patient Active Problem List   Diagnosis Date Noted   Pyelonephritis 02/13/2019   Prostatitis 10/05/2018   Leukopenia 09/20/2018   Thrombocytopenia (HCC) 09/20/2018   Hyponatremia 09/20/2018   Avoidance coping 09/07/2018   Headache 04/27/2018   Condyloma 04/24/2018   Protein-calorie malnutrition, severe 12/12/2017   Personal history of MRSA (methicillin resistant Staphylococcus aureus) 12/09/2017   History of ESBL E. coli infection 12/09/2017   AIDS (acquired immune deficiency syndrome) (HCC)    Homelessness    Dysplasia of anus 01/02/2014   Tobacco use disorder 01/02/2014   Human immunodeficiency virus (HIV) disease (HCC) 01/01/2014    Past Surgical History:  Procedure Laterality Date   TRANSURETHRAL RESECTION OF PROSTATE N/A 12/09/2017   Procedure: TRANSURETHRAL RESECTION OF THE PROSTATE (TURP);  Surgeon: Crist FatHerrick, Benjamin W, MD;  Location: [facility];  Service: Urology;  Laterality: N/A;        Home Medications    Prior to Admission medications   Medication Sig Start Date End Date Taking? Authorizing Provider  acetaminophen (TYLENOL) 500 MG tablet Take 500-1,000 mg by mouth every 6 (six) hours as needed (for headaches or pain).   Yes [provider]  ibuprofen (ADVIL) 200 MG tablet Take 200-400 mg by mouth every 6 (six) hours as needed (for headaches or pain).   Yes [provider]  azithromycin (ZITHROMAX) 600 MG tablet Take 2 tablets (1,200 mg total) by mouth once a week. Patient not taking: Reported on 02/13/2019 10/24/18   Blanchard Kelchixon, Stephanie N, NP  Darunavir-Cobicisctat-Emtricitabine-Tenofovir Alafenamide (SYMTUZA) 800-150-200-10 MG TABS Take 1 tablet by mouth daily with breakfast. Patient not taking:  Reported on 02/13/2019 10/24/18   Blanchard Kelchixon, Stephanie N, NP  imiquimod Mathis Dad(ALDARA) 5 % cream Apply topically 3 (three) times a week. At night, thin layer Patient not taking: Reported on 02/13/2019 10/24/18   Blanchard Kelchixon, Stephanie N, NP  ondansetron (ZOFRAN) 4 MG tablet Take 1 tablet (4 mg total) by mouth every 8 (eight) hours as needed for nausea or vomiting. Patient not taking: Reported on 02/13/2019 10/04/18   Blanchard Kelchixon, Stephanie N, NP  sulfamethoxazole-trimethoprim (BACTRIM) 400-80 MG tablet Take 1 tablet by mouth daily. Patient not taking: Reported on 02/13/2019 11/05/18   Blanchard Kelchixon, Stephanie N, NP  SUMAtriptan (IMITREX) 25 MG tablet Take 1 tablet (25 mg total) by mouth every 2 (two) hours as needed for migraine. May repeat in 2 hours if headache persists or recurs. Patient not taking: Reported on 02/13/2019 10/04/18   Blanchard Kelchixon, Stephanie N, NP    Family History Family History  Problem Relation Age of Onset   Hypertension Mother    Lupus Maternal Grandmother    Cancer Paternal Grandfather     Social History Social History   Tobacco Use   Smoking status: Current Every Day Smoker    Packs/day: 0.30    Types: Cigarettes   Smokeless tobacco: Never Used   Tobacco comment: cutting back  Substance Use Topics   Alcohol use: Yes    Alcohol/week: 1.0 standard drinks    Types: 1 Standard drinks or equivalent per week    Comment: whiskey occasionally    Drug use: Yes    Frequency: 1.0 times per week    Types: Marijuana     Allergies   Vancomycin   Review of Systems Review of Systems  Constitutional: Positive for fatigue and fever. Negative for chills.  HENT: Negative for congestion and facial swelling.   Eyes: Negative for discharge and visual disturbance.  Respiratory: Positive for cough. Negative for shortness of breath.   Cardiovascular: Negative for chest pain and palpitations.  Gastrointestinal: Negative for abdominal pain, diarrhea and vomiting.  Genitourinary: Positive for dysuria.    Musculoskeletal: Negative for arthralgias and myalgias.  Skin: Negative for color change and rash.  Neurological: Negative for tremors, syncope and headaches.  Psychiatric/Behavioral: Negative for confusion and dysphoric mood.     Physical Exam Updated Vital Signs BP 104/61    Pulse 66    Temp 99.6 F (37.6 C) (Oral)    Resp 14    Ht 6\' 1"  (1.854 m)    Wt 93 kg    SpO2 100%    BMI 27.05 kg/m   Physical Exam Vitals signs and nursing note reviewed.  Constitutional:      Appearance: He is well-developed.  HENT:     Head: Normocephalic and atraumatic.  Eyes:     Pupils: Pupils are equal, round, and reactive to light.  Neck:     Musculoskeletal: Normal range of motion and neck supple.     Vascular: No JVD.  Cardiovascular:     Rate and Rhythm: Normal rate and regular rhythm.     Heart sounds: No murmur.  No friction rub. No gallop.   Pulmonary:     Effort: No respiratory distress.     Breath sounds: No wheezing.  Abdominal:     General: There is no distension.     Tenderness: There is no abdominal tenderness. There is right CVA tenderness and left CVA tenderness. There is no guarding or rebound.  Musculoskeletal: Normal range of motion.  Skin:    Coloration: Skin is not pale.     Findings: No rash.  Neurological:     Mental Status: He is alert and oriented to person, place, and time.  Psychiatric:        Behavior: Behavior normal.      ED Treatments / Results  Labs (all labs ordered are listed, but only abnormal results are displayed) Labs Reviewed  URINALYSIS, ROUTINE W REFLEX MICROSCOPIC - Abnormal; Notable for the following components:      Result Value   Color, Urine AMBER (*)    APPearance CLOUDY (*)    Hgb urine dipstick MODERATE (*)    Protein, ur 100 (*)    Leukocytes,Ua LARGE (*)    WBC, UA >50 (*)    Bacteria, UA MANY (*)    All other components within normal limits  BASIC METABOLIC PANEL - Abnormal; Notable for the following components:   Sodium 131  (*)    Potassium 3.3 (*)    CO2 21 (*)    Glucose, Bld 100 (*)    Calcium 8.4 (*)    All other components within normal limits  CBC - Abnormal; Notable for the following components:   RBC 3.75 (*)    Hemoglobin 11.5 (*)    HCT 35.3 (*)    All other components within normal limits  SARS CORONAVIRUS 2 (HOSPITAL ORDER, PERFORMED IN [facility])  URINE CULTURE  CULTURE, BLOOD (ROUTINE X 2)  CULTURE, BLOOD (ROUTINE X 2)  LACTIC ACID, PLASMA  LACTIC ACID, PLASMA  BASIC METABOLIC PANEL  CBC  T-HELPER CELLS (CD4) COUNT ([facility])  HIV-1 RNA QUANT-NO REFLEX-BLD    EKG None  Radiology Ct Abdomen Pelvis W Contrast  Result Date: 02/13/2019 CLINICAL DATA:  Generalized abdominal pain, fever, dysuria EXAM: CT ABDOMEN AND PELVIS WITH CONTRAST TECHNIQUE: Multidetector CT imaging of the abdomen and pelvis was performed using the standard protocol following bolus administration of intravenous contrast. CONTRAST:  100mL OMNIPAQUE IOHEXOL 300 MG/ML  SOLN COMPARISON:  CT abdomen pelvis, 12/09/2017, CT pelvis, 06/08/2018 FINDINGS: Lower chest: No acute abnormality. Hepatobiliary: No solid liver abnormality is seen. Contracted gallbladder. No gallstones, gallbladder wall thickening, or biliary dilatation. Pancreas: Unremarkable. No pancreatic ductal dilatation or surrounding inflammatory changes. Spleen: Normal in size without significant abnormality. Adrenals/Urinary Tract: Adrenal glands are unremarkable. Kidneys are normal, without renal calculi, solid lesion, or hydronephrosis. Bladder is unremarkable. Stomach/Bowel: Stomach is within normal limits. Appendix appears normal. No evidence of bowel wall thickening, distention, or inflammatory changes. Vascular/Lymphatic: No significant vascular findings are present. Numerous nonspecific enlarged retroperitoneal, pelvic, and inguinal lymph nodes, unchanged from prior examinations. Largest retroperitoneal nodes measure approximately 2.2 x 1.2 cm  (series 3, image 47). Reproductive: Redemonstrated TURP defect of the prostate. Other: No abdominal wall hernia or abnormality. No abdominopelvic ascites. Musculoskeletal: No acute or significant osseous findings. IMPRESSION: 1. No acute CT findings of the abdomen or pelvis to explain abdominal pain, fever, or dysuria. Redemonstrated TURP defect. No urinary tract calculus, hydronephrosis, or other abnormality of the urinary tract. 2. Numerous nonspecific enlarged retroperitoneal, pelvic, and inguinal  lymph nodes, unchanged from prior examinations. Largest retroperitoneal nodes measure approximately 2.2 x 1.2 cm (series 3, image 47). Nonspecific enlarged lymph nodes can be seen in the setting of HIV infection per reported history. Electronically Signed   By: Eddie Candle M.D.   On: 02/13/2019 17:04   Dg Chest Port 1 View  Result Date: 02/13/2019 CLINICAL DATA:  Cough fever and dizziness for 2 weeks EXAM: PORTABLE CHEST 1 VIEW COMPARISON:  September 19, 2018 FINDINGS: The heart size and mediastinal contours are within normal limits. Both lungs are clear. The visualized skeletal structures are unremarkable. IMPRESSION: No active disease. Electronically Signed   By: Abelardo Diesel M.D.   On: 02/13/2019 15:59    Procedures Procedures (including critical care time)  Medications Ordered in ED Medications  azithromycin (ZITHROMAX) tablet 1,200 mg (has no administration in time range)  sulfamethoxazole-trimethoprim (BACTRIM) 400-80 MG per tablet 1 tablet (has no administration in time range)  enoxaparin (LOVENOX) injection 40 mg (has no administration in time range)  acetaminophen (TYLENOL) tablet 650 mg (has no administration in time range)    Or  acetaminophen (TYLENOL) suppository 650 mg (has no administration in time range)  piperacillin-tazobactam (ZOSYN) IVPB 3.375 g (has no administration in time range)  sodium chloride 0.9 % bolus 1,000 mL (0 mLs Intravenous Stopped 02/13/19 1814)  acetaminophen  (TYLENOL) tablet 1,000 mg (1,000 mg Oral Given 02/13/19 1543)  ketorolac (TORADOL) 15 MG/ML injection 15 mg (15 mg Intravenous Given 02/13/19 1621)  piperacillin-tazobactam (ZOSYN) IVPB 3.375 g (0 g Intravenous Stopped 02/13/19 1706)  iohexol (OMNIPAQUE) 300 MG/ML solution 100 mL (100 mLs Intravenous Contrast Given 02/13/19 1643)     Initial Impression / Assessment and Plan / ED Course  I have reviewed the triage vital signs and the nursing notes.  Pertinent labs & imaging results that were available during my care of the patient were reviewed by me and considered in my medical decision making (see chart for details).        33 yo M with a chief complaints of bilateral flank pain and fever.  Patient has a history of recurrent urinary tract infections.  He is HIV positive with his last CD4 count less than 200.  Patient's presentation is most consistent with pyelonephritis.  His urine does appear to be infected. CT to eval for recurrent abscess.   CT scan is without abscess.  The patient's most recent urinary culture was consistent with a extended beta-lactam resistant E. coli.  I will start him on Zosyn.  Will discuss with hospitalist.  The patients results and plan were reviewed and discussed.   Any x-rays performed were independently reviewed by myself.   Differential diagnosis were considered with the presenting HPI.  Medications  azithromycin (ZITHROMAX) tablet 1,200 mg (has no administration in time range)  sulfamethoxazole-trimethoprim (BACTRIM) 400-80 MG per tablet 1 tablet (has no administration in time range)  enoxaparin (LOVENOX) injection 40 mg (has no administration in time range)  acetaminophen (TYLENOL) tablet 650 mg (has no administration in time range)    Or  acetaminophen (TYLENOL) suppository 650 mg (has no administration in time range)  piperacillin-tazobactam (ZOSYN) IVPB 3.375 g (has no administration in time range)  sodium chloride 0.9 % bolus 1,000 mL (0 mLs  Intravenous Stopped 02/13/19 1814)  acetaminophen (TYLENOL) tablet 1,000 mg (1,000 mg Oral Given 02/13/19 1543)  ketorolac (TORADOL) 15 MG/ML injection 15 mg (15 mg Intravenous Given 02/13/19 1621)  piperacillin-tazobactam (ZOSYN) IVPB 3.375 g (0 g Intravenous Stopped 02/13/19 1706)  iohexol (OMNIPAQUE) 300 MG/ML solution 100 mL (100 mLs Intravenous Contrast Given 02/13/19 1643)    Vitals:   02/13/19 1709 02/13/19 1755 02/13/19 1826 02/13/19 1830  BP: 101/67  102/64 104/61  Pulse: 72 70 (!) 58 66  Resp: 14     Temp: 99.6 F (37.6 C)     TempSrc: Oral     SpO2: 99% 100% 100% 100%  Weight:      Height:        Final diagnoses:  Pyelonephritis    Admission/ observation were discussed with the admitting physician, patient and/or family and they are comfortable with the plan.   Final Clinical Impressions(s) / ED Diagnoses   Final diagnoses:  Pyelonephritis    ED Discharge Orders    None       Melene Plan, DO 02/13/19 2033

## 2019-02-13 NOTE — ED Notes (Signed)
Family practice text paged concerning soft blood pressures, requested maintenance fluids

## 2019-02-13 NOTE — H&P (Addendum)
Family Medicine Teaching [facility] Admission History and Physical Service Pager: [phone]  Patient name: Jacob Gutierrez Medical record number: [id number] Date of birth: [birth year] Age: 33 y.o. Gender: male  Primary Care Provider: Grayce SessionsEdwards, Michelle P, NP Consultants: ID Code Status: Full  Chief Complaint: Flank pain  Assessment and Plan: Jacob Gutierrez is a 33 y.o. male presenting with flank pain . PMH is significant for HIV/AIDS.   Pyonephritis Patient with couple of weeks of progressing back pain along with dysuria along with foul smelling, dark urine. Patient states his flank pain is worsened during urination and that he also has a non-burning genital pain when urinating. Vitals in the ED were stable with-in normal limits, with exception of a 101.59F fever on admission. Electrolytes were significant for a Na+ of 131, K+ of 3.3. CBC showed mild anemia of 11.5. Urinalysis was significant for cloudy urine with large leukocytes and moderate Hgb as well as many bacteria. UA WBC showed >50. CT abdomen showed no hydronephrosis, renal calculi, or solid lesions in the kidneys and an unremarkable bladder. CXR was unremarkable. In the ED the patient was started on Zosyn giving his immunocompromised state Etiology: Pyelonephritis certainly seems likely in this patient given the flank pain that's worsened during urination. The fact that his pain "comes and goes" throughout the day also supports this along with the fact that he describes his urine as foul smelling and had a fever on admission. The patient does not seem septic with normal vitals (except for fever) and the patient's overall demeanor, such as asking when he can eat. Nephrolithiasis could also account for his back pain and could be worse during urination, however no stones were seen on CT. Lower urinary tract infections could explain the foul smelling, dark urine and genital pain during urination but likely would not explain the flank  pain.  Patient declined genital exam - Admit to care of Dr. Jennette KettleNeal, med-surg - Will call ID in a.m.  - Monitor fevers, Tylenol prn  - Monitor vitals for signs of worsening infection - Continue Zosyn    HIV/AIDS - uncontrolled, noncompliant Patient states he hasn't been taking his medications since losing them about a month ago. States he would be willing to restart them. CD4 of 10, viral load of 1.2 million in December 2019, CD4 of 170 and viral load of 1,850 in April 2020. Last note from ID mentioned anal warts but patient declined to let us examine him and said they were not bothering him - ID consulted as above  -f/u CD4, and HIV counts - Restart Azithromycin 1200mg  1 time per week dose. - Restart Bactrim 400-80mg  daily  Anal Warts: Patient with problem complaint of anal warts on recent clinic visit.  Was offered examination and declined.  Says he has no current problems with that. -Can examine if patient consents in the future during this admission.  Substance abuse: Patient was appropriate and did not seem altered on exam.  Patient says he does smoke, but not enough that he needs a nicotine patch, he was offered and declined.  Patient that he does not drink alcohol and there be no concern for withdrawals, states that he uses marijuana but denies any other illicit drug use. -Can monitor for altered state.  FEN/GI: Regular Prophylaxis: Lovenox  Disposition: Pending medical workup and ID advice  History of Present Illness:  Jacob Gutierrez is a 33 y.o. male presenting with flank pain.  Patient endorses back pain for several weeks located in  the center of his back that has slowly gotten worse. He describes the pain as "feeling like I was dropped kicked". About a week ago he says the pain was so bad he could not sleep. He started taking some tylenol and ibuprofen and was able to get through a few additional days. He denies ever using more than 5 of either tylenol or ibuprofen per day.  The patient states that lately he has had dark urine with a strong smell and that the act of urinating replicates his back pain and causes a non-burning pain in his genitals. He states his back pain ranges from 3/10 to 10/10 and that it seems to come and go.   Patient denies trouble moving bowels, recent sick contacts, sores or lesions on his body. He denies trouble breathing but does admit to some pleuritic back pain. He states he works at [employer]   Patient states he does smoke cigarettes but does not feel he needs nicotine patches or gum. He denies alcohol and states that the only illicit drug he uses is THC on occasion.  Review Of Systems: Per HPI with the following additions:   Review of Systems  Constitutional: Positive for chills and fever.  Respiratory: Negative for cough.   Cardiovascular: Negative for chest pain.  Gastrointestinal: Negative for nausea.  Genitourinary: Positive for dysuria and flank pain.    Patient Active Problem List   Diagnosis Date Noted  . Pyelonephritis 02/13/2019  . Prostatitis 10/05/2018  . Leukopenia 09/20/2018  . Thrombocytopenia (HCC) 09/20/2018  . Hyponatremia 09/20/2018  . 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 Headache 04/27/2018  . Condyloma 04/24/2018  . Protein-calorie malnutrition, severe 12/12/2017  . Personal history of MRSA (methicillin resistant Staphylococcus aureus) 12/09/2017  . History of ESBL E. coli infection 12/09/2017  . AIDS (acquired immune deficiency syndrome) (HCC)   . Homelessness   . 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 Tobacco use disorder 01/02/2014  . Human immunodeficiency virus (HIV) disease (HCC) 01/01/2014    Past Medical History: Past Medical History:  Diagnosis Date  . AIDS (acquired immune deficiency syndrome) (HCC)   . AIDS (acquired immune deficiency syndrome) (HCC)   . 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 H/O coccidioidomycosis    pulmonary   . HIV disease (HCC)   .  Past history of allergy to penicillin-type antibiotic 07-03-2012   desensitization  . PNA (pneumonia)   . Shigella gastroenteritis   . Syphilis    history /treated     Past Surgical History: Past Surgical History:  Procedure Laterality Date  . TRANSURETHRAL RESECTION OF PROSTATE N/A 12/09/2017   Procedure: TRANSURETHRAL RESECTION OF THE PROSTATE (TURP);  Surgeon: Crist FatHerrick, Benjamin W, MD;  Location: [facility];  Service: Urology;  Laterality: N/A;    Social History: Social History   Tobacco Use  . Smoking status: Current Every Day Smoker    Packs/day: 0.30    Types: Cigarettes  . Smokeless tobacco: Never Used  . Tobacco comment: cutting back  Substance Use Topics  . Alcohol use: Yes    Alcohol/week: 1.0 standard drinks    Types: 1 Standard drinks or equivalent per week    Comment: whiskey occasionally   . Drug use: Yes    Frequency: 1.0 times per week    Types: Marijuana   Additional social history:  Please also refer to relevant sections of EMR.  Family History: Family History  Problem Relation Age of Onset  . Hypertension Mother   .  Lupus Maternal Grandmother   . Cancer Paternal Grandfather     Allergies and Medications: Allergies  Allergen Reactions  . Amoxicillin Other (See Comments)    From childhood: "I had a reaction when i was little." (??)  . Vancomycin Itching, Swelling and Other (See Comments)    Angioedema, also   No current facility-administered medications on file prior to encounter.    Current Outpatient Medications on File Prior to Encounter  Medication Sig Dispense Refill  . acetaminophen (TYLENOL) 500 MG tablet Take 500-1,000 mg by mouth every 6 (six) hours as needed (for headaches or pain).    Marland Kitchen. ibuprofen (ADVIL) 200 MG tablet Take 200-400 mg by mouth every 6 (six) hours as needed (for headaches or pain).    Marland Kitchen. azithromycin (ZITHROMAX) 600 MG tablet Take 2 tablets (1,200 mg total) by mouth once a week. (Patient not taking: Reported on 02/13/2019) 4  tablet 5  . Darunavir-Cobicisctat-Emtricitabine-Tenofovir Alafenamide (SYMTUZA) 800-150-200-10 MG TABS Take 1 tablet by mouth daily with breakfast. (Patient not taking: Reported on 02/13/2019) 30 tablet 5  . imiquimod (ALDARA) 5 % cream Apply topically 3 (three) times a week. At night, thin layer (Patient not taking: Reported on 02/13/2019) 12 each 2  . ondansetron (ZOFRAN) 4 MG tablet Take 1 tablet (4 mg total) by mouth every 8 (eight) hours as needed for nausea or vomiting. (Patient not taking: Reported on 02/13/2019) 20 tablet 0  . sulfamethoxazole-trimethoprim (BACTRIM) 400-80 MG tablet Take 1 tablet by mouth daily. (Patient not taking: Reported on 02/13/2019) 30 tablet 2  . SUMAtriptan (IMITREX) 25 MG tablet Take 1 tablet (25 mg total) by mouth every 2 (two) hours as needed for migraine. May repeat in 2 hours if headache persists or recurs. (Patient not taking: Reported on 02/13/2019) 10 tablet 0    Objective: BP 104/61   Pulse 66   Temp 99.6 F (37.6 C) (Oral)   Resp 14   Ht 6\' 1"  (1.854 m)   Wt 93 kg   SpO2 100%   BMI 27.05 kg/m  Exam: General: Alert and oriented, no apparent distress  Eyes: PERRLA Neck: Nontender Cardiovascular: RRR with no murmurs noted Respiratory: CTA bilaterally  Gastrointestinal: Bowel sounds present. No abdominal pain Psych: Behavior and speech appropriate to situation  Labs and Imaging: CBC BMET  Recent Labs  Lab 02/13/19 1448  WBC 5.3  HGB 11.5*  HCT 35.3*  PLT 189   Recent Labs  Lab 02/13/19 1448  NA 131*  K 3.3*  CL 101  CO2 21*  BUN 10  CREATININE 0.93  GLUCOSE 100*  CALCIUM 8.4*     Ct Abdomen Pelvis W Contrast  Result Date: 02/13/2019 CLINICAL DATA:  Generalized abdominal pain, fever, dysuria EXAM: CT ABDOMEN AND PELVIS WITH CONTRAST TECHNIQUE: Multidetector CT imaging of the abdomen and pelvis was performed using the standard protocol following bolus administration of intravenous contrast. CONTRAST:  100mL OMNIPAQUE IOHEXOL 300  MG/ML  SOLN COMPARISON:  CT abdomen pelvis, 12/09/2017, CT pelvis, 06/08/2018 FINDINGS: Lower chest: No acute abnormality. Hepatobiliary: No solid liver abnormality is seen. Contracted gallbladder. No gallstones, gallbladder wall thickening, or biliary dilatation. Pancreas: Unremarkable. No pancreatic ductal dilatation or surrounding inflammatory changes. Spleen: Normal in size without significant abnormality. Adrenals/Urinary Tract: Adrenal glands are unremarkable. Kidneys are normal, without renal calculi, solid lesion, or hydronephrosis. Bladder is unremarkable. Stomach/Bowel: Stomach is within normal limits. Appendix appears normal. No evidence of bowel wall thickening, distention, or inflammatory changes. Vascular/Lymphatic: No significant vascular findings are present. Numerous nonspecific  enlarged retroperitoneal, pelvic, and inguinal lymph nodes, unchanged from prior examinations. Largest retroperitoneal nodes measure approximately 2.2 x 1.2 cm (series 3, image 47). Reproductive: Redemonstrated TURP defect of the prostate. Other: No abdominal wall hernia or abnormality. No abdominopelvic ascites. Musculoskeletal: No acute or significant osseous findings. IMPRESSION: 1. No acute CT findings of the abdomen or pelvis to explain abdominal pain, fever, or dysuria. Redemonstrated TURP defect. No urinary tract calculus, hydronephrosis, or other abnormality of the urinary tract. 2. Numerous nonspecific enlarged retroperitoneal, pelvic, and inguinal lymph nodes, unchanged from prior examinations. Largest retroperitoneal nodes measure approximately 2.2 x 1.2 cm (series 3, image 47). Nonspecific enlarged lymph nodes can be seen in the setting of HIV infection per reported history. Electronically Signed   By: Eddie Candle M.D.   On: 02/13/2019 17:04   Dg Chest Port 1 View  Result Date: 02/13/2019 CLINICAL DATA:  Cough fever and dizziness for 2 weeks EXAM: PORTABLE CHEST 1 VIEW COMPARISON:  September 19, 2018  FINDINGS: The heart size and mediastinal contours are within normal limits. Both lungs are clear. The visualized skeletal structures are unremarkable. IMPRESSION: No active disease. Electronically Signed   By: Abelardo Diesel M.D.   On: 02/13/2019 15:59    Lurline Del, MD 02/13/2019, 7:20 PM PGY1, [facility] Intern pager: [phone], text pages welcome  FPTS Upper-Level Resident Addendum   I have independently interviewed and examined the patient. I have discussed the above with the original author and agree with their documentation. My edits for correction/addition/clarification are in blue. Please see also any attending notes.    Sherene Sires, DO PGY-3, [facility] 02/13/2019 8:39 PM  [facility] Service pager: [phone] (text pages welcome through [facility])

## 2019-02-13 NOTE — ED Triage Notes (Signed)
To triage via EMS.  Onset 2 weeks back pain, dysuria, frequent urination, odor and dark urine.   EMS BP 112/80, HR 110, T 102.2 SpO2 97%.

## 2019-02-13 NOTE — ED Notes (Signed)
ED TO INPATIENT HANDOFF REPORT  ED Nurse Name and Phone #: Lowanda FosterBrittany [phone]  S Name/Age/Gender Jacob Gutierrez 33 y.o. male Room/Bed: 012C/012C  Code Status   Code Status: Full Code  Home/SNF/Other Home Patient oriented to: self, place, time and situation Is this baseline? Yes   Triage Complete: Triage complete  Chief Complaint back pain  Triage Note To triage via EMS.  Onset 2 weeks back pain, dysuria, frequent urination, odor and dark urine.   EMS BP 112/80, HR 110, T 102.2 SpO2 97%.     Allergies Allergies  Allergen Reactions  . Vancomycin Itching, Swelling and Other (See Comments)    Angioedema, also    Level of Care/Admitting Diagnosis ED Disposition    ED Disposition Condition Comment   Admit  [facility] [[id number]]  Level of Care: Med-Surg [16]  Covid Evaluation: Confirmed COVID Negative  Diagnosis: Pyelonephritis [213086][[id number]]  Admitting Physician: Marthenia RollingBLAND, SCOTT [[id number]]  Attending Physician: Nestor RampNEAL, SARA L [[id number]]  Estimated length of stay: past midnight tomorrow  Certification:: I certify this patient will need inpatient services for at least 2 midnights  PT Class (Do Not Modify): Inpatient [101]  PT Acc Code (Do Not Modify): Private [1]       B Medical/Surgery History Past Medical History:  Diagnosis Date  . AIDS (acquired immune deficiency syndrome) (HCC)   . AIDS (acquired immune deficiency syndrome) (HCC)   . 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 H/O coccidioidomycosis    pulmonary   . HIV disease (HCC)   . Past history of allergy to penicillin-type antibiotic 07-03-2012   desensitization  . PNA (pneumonia)   . Shigella gastroenteritis   . Syphilis    history /treated    Past Surgical History:  Procedure Laterality Date  . TRANSURETHRAL RESECTION OF PROSTATE N/A 12/09/2017   Procedure: TRANSURETHRAL RESECTION OF THE PROSTATE (TURP);  Surgeon: Crist FatHerrick, Benjamin W, MD;   Location: [facility];  Service: Urology;  Laterality: N/A;     A IV Location/Drains/Wounds Patient Lines/Drains/Airways Status   Active Line/Drains/Airways    Name:   Placement date:   Placement time:   Site:   Days:   Peripheral IV 02/13/19 Right Hand   02/13/19    1608    Hand   less than 1          Intake/Output Last 24 hours  Intake/Output Summary (Last 24 hours) at 02/13/2019 2102 Last data filed at 02/13/2019 1814 Gross per 24 hour  Intake 1063.33 ml  Output -  Net 1063.33 ml    Labs/Imaging Results for orders placed or performed during the hospital encounter of 02/13/19 (from the past 48 hour(s))  Basic metabolic panel     Status: Abnormal   Collection Time: 02/13/19  2:48 PM  Result Value Ref Range   Sodium 131 (L) 135 - 145 mmol/L   Potassium 3.3 (L) 3.5 - 5.1 mmol/L   Chloride 101 98 - 111 mmol/L   CO2 21 (L) 22 - 32 mmol/L   Glucose, Bld 100 (H) 70 - 99 mg/dL   BUN 10 6 - 20 mg/dL   Creatinine, Ser 2.950.93 0.61 - 1.24 mg/dL   Calcium 8.4 (L) 8.9 - 10.3 mg/dL   GFR calc non Af Amer >60 >60 mL/min   GFR calc Af Amer >60 >60 mL/min   Anion gap 9 5 - 15    Comment: Performed at [facility], [address]  [place], [place] [address]  CBC     Status: Abnormal   Collection Time: 02/13/19  2:48 PM  Result Value Ref Range   WBC 5.3 4.0 - 10.5 K/uL   RBC 3.75 (L) 4.22 - 5.81 MIL/uL   Hemoglobin 11.5 (L) 13.0 - 17.0 g/dL   HCT 60.435.3 (L) 54.039.0 - 98.152.0 %   MCV 94.1 80.0 - 100.0 fL   MCH 30.7 26.0 - 34.0 pg   MCHC 32.6 30.0 - 36.0 g/dL   RDW 19.114.2 47.811.5 - 29.515.5 %   Platelets 189 150 - 400 K/uL   nRBC 0.0 0.0 - 0.2 %    Comment: Performed at [facility], [address] [place], [place] [address]  Urinalysis, Routine w reflex microscopic- may I&O cath if menses     Status: Abnormal   Collection Time: 02/13/19  3:05 PM  Result Value Ref Range   Color, Urine AMBER (A) YELLOW    Comment: BIOCHEMICALS MAY BE AFFECTED BY COLOR   APPearance CLOUDY (A) CLEAR    Specific Gravity, Urine 1.015 1.005 - 1.030   pH 6.0 5.0 - 8.0   Glucose, UA NEGATIVE NEGATIVE mg/dL   Hgb urine dipstick MODERATE (A) NEGATIVE   Bilirubin Urine NEGATIVE NEGATIVE   Ketones, ur NEGATIVE NEGATIVE mg/dL   Protein, ur 865100 (A) NEGATIVE mg/dL   Nitrite NEGATIVE NEGATIVE   Leukocytes,Ua LARGE (A) NEGATIVE   RBC / HPF 21-50 0 - 5 RBC/hpf   WBC, UA >50 (H) 0 - 5 WBC/hpf   Bacteria, UA MANY (A) NONE SEEN   Squamous Epithelial / LPF 0-5 0 - 5   WBC Clumps PRESENT    Mucus PRESENT     Comment: Performed at [facility], [address] [place], [place] [address]  Lactic acid, plasma     Status: None   Collection Time: 02/13/19  3:21 PM  Result Value Ref Range   Lactic Acid, Venous 1.8 0.5 - 1.9 mmol/L    Comment: Performed at [facility], [address] [place], [place] [address]  SARS Coronavirus 2 (CEPHEID - Performed in [facility] lab), Hosp Order     Status: None   Collection Time: 02/13/19  3:58 PM   Specimen: Nasopharyngeal Swab  Result Value Ref Range   SARS Coronavirus 2 NEGATIVE NEGATIVE    Comment: (NOTE) If result is NEGATIVE SARS-CoV-2 target nucleic acids are NOT DETECTED. The SARS-CoV-2 RNA is generally detectable in upper and lower  respiratory specimens during the acute phase of infection. The lowest  concentration of SARS-CoV-2 viral copies this assay can detect is 250  copies / mL. A negative result does not preclude SARS-CoV-2 infection  and should not be used as the sole basis for treatment or other  patient management decisions.  A negative result may occur with  improper specimen collection / handling, submission of specimen other  than nasopharyngeal swab, presence of viral mutation(s) within the  areas targeted by this assay, and inadequate number of viral copies  (<250 copies / mL). A negative result must be combined with clinical  observations, patient history, and epidemiological information. If result is  POSITIVE SARS-CoV-2 target nucleic acids are DETECTED. The SARS-CoV-2 RNA is generally detectable in upper and lower  respiratory specimens dur ing the acute phase of infection.  Positive  results are indicative of active infection with SARS-CoV-2.  Clinical  correlation with patient history and other diagnostic information is  necessary to determine patient infection  status.  Positive results do  not rule out bacterial infection or co-infection with other viruses. If result is PRESUMPTIVE POSTIVE SARS-CoV-2 nucleic acids MAY BE PRESENT.   A presumptive positive result was obtained on the submitted specimen  and confirmed on repeat testing.  While 2019 novel coronavirus  (SARS-CoV-2) nucleic acids may be present in the submitted sample  additional confirmatory testing may be necessary for epidemiological  and / or clinical management purposes  to differentiate between  SARS-CoV-2 and other Sarbecovirus currently known to infect humans.  If clinically indicated additional testing with an alternate test  methodology [id number]) is advised. The SARS-CoV-2 RNA is generally  detectable in upper and lower respiratory sp ecimens during the acute  phase of infection. The expected result is Negative. Fact Sheet for Patients:  StrictlyIdeas.no Fact Sheet for Healthcare Providers: BankingDealers.co.za This test is not yet approved or cleared by the [place] FDA and has been authorized for detection and/or diagnosis of SARS-CoV-2 by FDA under an Emergency Use Authorization (EUA).  This EUA will remain in effect (meaning this test can be used) for the duration of the COVID-19 declaration under Section 564(b)(1) of the Act, 21 U.S.C. section 360bbb-3(b)(1), unless the authorization is terminated or revoked sooner. Performed at [facility], [address], [place], [place] [address]    Ct Abdomen Pelvis W Contrast  Result Date:  02/13/2019 CLINICAL DATA:  Generalized abdominal pain, fever, dysuria EXAM: CT ABDOMEN AND PELVIS WITH CONTRAST TECHNIQUE: Multidetector CT imaging of the abdomen and pelvis was performed using the standard protocol following bolus administration of intravenous contrast. CONTRAST:  120mL OMNIPAQUE IOHEXOL 300 MG/ML  SOLN COMPARISON:  CT abdomen pelvis, 12/09/2017, CT pelvis, 06/08/2018 FINDINGS: Lower chest: No acute abnormality. Hepatobiliary: No solid liver abnormality is seen. Contracted gallbladder. No gallstones, gallbladder wall thickening, or biliary dilatation. Pancreas: Unremarkable. No pancreatic ductal dilatation or surrounding inflammatory changes. Spleen: Normal in size without significant abnormality. Adrenals/Urinary Tract: Adrenal glands are unremarkable. Kidneys are normal, without renal calculi, solid lesion, or hydronephrosis. Bladder is unremarkable. Stomach/Bowel: Stomach is within normal limits. Appendix appears normal. No evidence of bowel wall thickening, distention, or inflammatory changes. Vascular/Lymphatic: No significant vascular findings are present. Numerous nonspecific enlarged retroperitoneal, pelvic, and inguinal lymph nodes, unchanged from prior examinations. Largest retroperitoneal nodes measure approximately 2.2 x 1.2 cm (series 3, image 47). Reproductive: Redemonstrated TURP defect of the prostate. Other: No abdominal wall hernia or abnormality. No abdominopelvic ascites. Musculoskeletal: No acute or significant osseous findings. IMPRESSION: 1. No acute CT findings of the abdomen or pelvis to explain abdominal pain, fever, or dysuria. Redemonstrated TURP defect. No urinary tract calculus, hydronephrosis, or other abnormality of the urinary tract. 2. Numerous nonspecific enlarged retroperitoneal, pelvic, and inguinal lymph nodes, unchanged from prior examinations. Largest retroperitoneal nodes measure approximately 2.2 x 1.2 cm (series 3, image 47). Nonspecific enlarged lymph  nodes can be seen in the setting of HIV infection per reported history. Electronically Signed   By: Eddie Candle M.D.   On: 02/13/2019 17:04   Dg Chest Port 1 View  Result Date: 02/13/2019 CLINICAL DATA:  Cough fever and dizziness for 2 weeks EXAM: PORTABLE CHEST 1 VIEW COMPARISON:  September 19, 2018 FINDINGS: The heart size and mediastinal contours are within normal limits. Both lungs are clear. The visualized skeletal structures are unremarkable. IMPRESSION: No active disease. Electronically Signed   By: Abelardo Diesel M.D.   On: 02/13/2019 15:59    Pending Labs Unresulted Labs (From admission, onward)  Start     Ordered   02/20/19 0500  Creatinine, serum  (enoxaparin (LOVENOX)    CrCl >/= 30 ml/min)  Weekly,   R    Comments: while on enoxaparin therapy    02/13/19 1918   02/14/19 0500  Basic metabolic panel  Tomorrow morning,   R     02/13/19 1918   02/14/19 0500  CBC  Tomorrow morning,   R     02/13/19 1918   02/13/19 2037  Acetaminophen level  Once,   STAT     02/13/19 2036   02/13/19 1919  HIV-1 RNA quant-no reflex-bld  Once,   STAT     02/13/19 1918   02/13/19 1918  T-helper cells (CD4) count (not at [facility])  Once,   STAT     02/13/19 1918   02/13/19 1521  Blood culture (routine x 2)  BLOOD CULTURE X 2,   STAT     02/13/19 1520   02/13/19 1521  Lactic acid, plasma  Now then every 2 hours,   STAT     02/13/19 1520   02/13/19 1441  Urine C&S  Once,   STAT    Question:  Patient immune status  Answer:  Immunocompromised   02/13/19 1441          Vitals/Pain Today's Vitals   02/13/19 1830 02/13/19 2000 02/13/19 2030 02/13/19 2100  BP: 104/61 98/74 (!) 91/55   Pulse: 66 62 60   Resp:      Temp:    98.4 F (36.9 C)  TempSrc:    Oral  SpO2: 100% 100% 100%   Weight:      Height:      PainSc:        Isolation Precautions No active isolations  Medications Medications  azithromycin (ZITHROMAX) tablet 1,200 mg (1,200 mg Oral Given 02/13/19 2051)   sulfamethoxazole-trimethoprim (BACTRIM) 400-80 MG per tablet 1 tablet (1 tablet Oral Given 02/13/19 2051)  enoxaparin (LOVENOX) injection 40 mg (40 mg Subcutaneous Given 02/13/19 2051)  acetaminophen (TYLENOL) tablet 650 mg (has no administration in time range)    Or  acetaminophen (TYLENOL) suppository 650 mg (has no administration in time range)  piperacillin-tazobactam (ZOSYN) IVPB 3.375 g (has no administration in time range)  sodium chloride 0.9 % bolus 1,000 mL (0 mLs Intravenous Stopped 02/13/19 1814)  acetaminophen (TYLENOL) tablet 1,000 mg (1,000 mg Oral Given 02/13/19 1543)  ketorolac (TORADOL) 15 MG/ML injection 15 mg (15 mg Intravenous Given 02/13/19 1621)  piperacillin-tazobactam (ZOSYN) IVPB 3.375 g (0 g Intravenous Stopped 02/13/19 1706)  iohexol (OMNIPAQUE) 300 MG/ML solution 100 mL (100 mLs Intravenous Contrast Given 02/13/19 1643)    Mobility walks Low fall risk   Focused Assessments Renal Assessment Handoff:       R Recommendations: See Admitting Provider Note  Report given to:   Additional Notes:

## 2019-02-13 NOTE — ED Notes (Signed)
ED TO INPATIENT HANDOFF REPORT  ED Nurse Name and Phone #: [phone]  S Name/Age/Gender Jacob Gutierrez 33 y.o. male Room/Bed: 012C/012C  Code Status   Code Status: Prior  Home/SNF/Other Home Patient oriented to: self, place, time and situation Is this baseline? Yes   Triage Complete: Triage complete  Chief Complaint back pain  Triage Note To triage via EMS.  Onset 2 weeks back pain, dysuria, frequent urination, odor and dark urine.   EMS BP 112/80, HR 110, T 102.2 SpO2 97%.     Allergies Allergies  Allergen Reactions  . Amoxicillin Other (See Comments)    From childhood: "I had a reaction when i was little." (??)  . Vancomycin Itching, Swelling and Other (See Comments)    Angioedema, also    Level of Care/Admitting Diagnosis ED Disposition    ED Disposition Condition Comment   Admit  The patient appears reasonably stabilized for admission considering the current resources, flow, and capabilities available in the ED at this time, and I doubt any other [facility] requiring further screening and/or treatment in the ED prior to admission is  present.       B Medical/Surgery History Past Medical History:  Diagnosis Date  . AIDS (acquired immune deficiency syndrome) ([place])   . AIDS (acquired immune deficiency syndrome) ([place])   . 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 ([place])   . H/O coccidioidomycosis    pulmonary   . HIV disease ([place])   . Past history of allergy to penicillin-type antibiotic 07-03-2012   desensitization  . PNA (pneumonia)   . Shigella gastroenteritis   . Syphilis    history /treated    Past Surgical History:  Procedure Laterality Date  . TRANSURETHRAL RESECTION OF PROSTATE N/A 12/09/2017   Procedure: TRANSURETHRAL RESECTION OF THE PROSTATE (TURP);  Surgeon: Ardis Hughs, MD;  Location: [facility];  Service: Urology;  Laterality: N/A;     A IV Location/Drains/Wounds Patient Lines/Drains/Airways Status   Active  Line/Drains/Airways    Name:   Placement date:   Placement time:   Site:   Days:   Peripheral IV 02/13/19 Right Hand   02/13/19    1608    Hand   less than 1          Intake/Output Last 24 hours  Intake/Output Summary (Last 24 hours) at 02/13/2019 1834 Last data filed at 02/13/2019 1814 Gross per 24 hour  Intake 1063.33 ml  Output -  Net 1063.33 ml    Labs/Imaging Results for orders placed or performed during the hospital encounter of 02/13/19 (from the past 48 hour(s))  Basic metabolic panel     Status: Abnormal   Collection Time: 02/13/19  2:48 PM  Result Value Ref Range   Sodium 131 (L) 135 - 145 mmol/L   Potassium 3.3 (L) 3.5 - 5.1 mmol/L   Chloride 101 98 - 111 mmol/L   CO2 21 (L) 22 - 32 mmol/L   Glucose, Bld 100 (H) 70 - 99 mg/dL   BUN 10 6 - 20 mg/dL   Creatinine, Ser 0.93 0.61 - 1.24 mg/dL   Calcium 8.4 (L) 8.9 - 10.3 mg/dL   GFR calc non Af Amer >60 >60 mL/min   GFR calc Af Amer >60 >60 mL/min   Anion gap 9 5 - 15    Comment: Performed at [facility], [address], [place], [place] [address]  CBC     Status: Abnormal   Collection Time: 02/13/19  2:48 PM  Result Value Ref Range   WBC 5.3 4.0 - 10.5 K/uL   RBC 3.75 (L) 4.22 - 5.81 MIL/uL   Hemoglobin 11.5 (L) 13.0 - 17.0 g/dL   HCT 16.135.3 (L) 09.639.0 - 04.552.0 %   MCV 94.1 80.0 - 100.0 fL   MCH 30.7 26.0 - 34.0 pg   MCHC 32.6 30.0 - 36.0 g/dL   RDW 40.914.2 81.111.5 - 91.415.5 %   Platelets 189 150 - 400 K/uL   nRBC 0.0 0.0 - 0.2 %    Comment: Performed at [facility], [address] [place], [place] [address]  Urinalysis, Routine w reflex microscopic- may I&O cath if menses     Status: Abnormal   Collection Time: 02/13/19  3:05 PM  Result Value Ref Range   Color, Urine AMBER (A) YELLOW    Comment: BIOCHEMICALS MAY BE AFFECTED BY COLOR   APPearance CLOUDY (A) CLEAR   Specific Gravity, Urine 1.015 1.005 - 1.030   pH 6.0 5.0 - 8.0   Glucose, UA NEGATIVE NEGATIVE mg/dL   Hgb urine dipstick MODERATE (A)  NEGATIVE   Bilirubin Urine NEGATIVE NEGATIVE   Ketones, ur NEGATIVE NEGATIVE mg/dL   Protein, ur 621100 (A) NEGATIVE mg/dL   Nitrite NEGATIVE NEGATIVE   Leukocytes,Ua LARGE (A) NEGATIVE   RBC / HPF 21-50 0 - 5 RBC/hpf   WBC, UA >50 (H) 0 - 5 WBC/hpf   Bacteria, UA MANY (A) NONE SEEN   Squamous Epithelial / LPF 0-5 0 - 5   WBC Clumps PRESENT    Mucus PRESENT     Comment: Performed at [facility], [address] [place], [place] [address]  Lactic acid, plasma     Status: None   Collection Time: 02/13/19  3:21 PM  Result Value Ref Range   Lactic Acid, Venous 1.8 0.5 - 1.9 mmol/L    Comment: Performed at [facility], [address] [place], [place] [address]  SARS Coronavirus 2 (CEPHEID - Performed in [facility] hospital lab), Hosp Order     Status: None   Collection Time: 02/13/19  3:58 PM   Specimen: Nasopharyngeal Swab  Result Value Ref Range   SARS Coronavirus 2 NEGATIVE NEGATIVE    Comment: (NOTE) If result is NEGATIVE SARS-CoV-2 target nucleic acids are NOT DETECTED. The SARS-CoV-2 RNA is generally detectable in upper and lower  respiratory specimens during the acute phase of infection. The lowest  concentration of SARS-CoV-2 viral copies this assay can detect is 250  copies / mL. A negative result does not preclude SARS-CoV-2 infection  and should not be used as the sole basis for treatment or other  patient management decisions.  A negative result may occur with  improper specimen collection / handling, submission of specimen other  than nasopharyngeal swab, presence of viral mutation(s) within the  areas targeted by this assay, and inadequate number of viral copies  (<250 copies / mL). A negative result must be combined with clinical  observations, patient history, and epidemiological information. If result is POSITIVE SARS-CoV-2 target nucleic acids are DETECTED. The SARS-CoV-2 RNA is generally detectable in upper and lower  respiratory specimens dur ing  the acute phase of infection.  Positive  results are indicative of active infection with SARS-CoV-2.  Clinical  correlation with patient history and other diagnostic information is  necessary to determine patient infection status.  Positive results do  not rule out bacterial infection or co-infection with other viruses. If  result is PRESUMPTIVE POSTIVE SARS-CoV-2 nucleic acids MAY BE PRESENT.   A presumptive positive result was obtained on the submitted specimen  and confirmed on repeat testing.  While 2019 novel coronavirus  (SARS-CoV-2) nucleic acids may be present in the submitted sample  additional confirmatory testing may be necessary for epidemiological  and / or clinical management purposes  to differentiate between  SARS-CoV-2 and other Sarbecovirus currently known to infect humans.  If clinically indicated additional testing with an alternate test  methodology [phone] is advised. The SARS-CoV-2 RNA is generally  detectable in upper and lower respiratory sp ecimens during the acute  phase of infection. The expected result is Negative. Fact Sheet for Patients:  BoilerBrush.com.cyhttps://www.fda.gov/media/136312/download Fact Sheet for Healthcare Providers: https://pope.com/https://www.fda.gov/media/136313/download This test is not yet approved or cleared by the [place] FDA and has been authorized for detection and/or diagnosis of SARS-CoV-2 by FDA under an Emergency Use Authorization (EUA).  This EUA will remain in effect (meaning this test can be used) for the duration of the COVID-19 declaration under Section 564(b)(1) of the Act, 21 U.S.C. section 360bbb-3(b)(1), unless the authorization is terminated or revoked sooner. Performed at [facility], [address] [place], [place] [address]    Ct Abdomen Pelvis W Contrast  Result Date: 02/13/2019 CLINICAL DATA:  Generalized abdominal pain, fever, dysuria EXAM: CT ABDOMEN AND PELVIS WITH CONTRAST TECHNIQUE: Multidetector CT imaging of the  abdomen and pelvis was performed using the standard protocol following bolus administration of intravenous contrast. CONTRAST:  100mL OMNIPAQUE IOHEXOL 300 MG/ML  SOLN COMPARISON:  CT abdomen pelvis, 12/09/2017, CT pelvis, 06/08/2018 FINDINGS: Lower chest: No acute abnormality. Hepatobiliary: No solid liver abnormality is seen. Contracted gallbladder. No gallstones, gallbladder wall thickening, or biliary dilatation. Pancreas: Unremarkable. No pancreatic ductal dilatation or surrounding inflammatory changes. Spleen: Normal in size without significant abnormality. Adrenals/Urinary Tract: Adrenal glands are unremarkable. Kidneys are normal, without renal calculi, solid lesion, or hydronephrosis. Bladder is unremarkable. Stomach/Bowel: Stomach is within normal limits. Appendix appears normal. No evidence of bowel wall thickening, distention, or inflammatory changes. Vascular/Lymphatic: No significant vascular findings are present. Numerous nonspecific enlarged retroperitoneal, pelvic, and inguinal lymph nodes, unchanged from prior examinations. Largest retroperitoneal nodes measure approximately 2.2 x 1.2 cm (series 3, image 47). Reproductive: Redemonstrated TURP defect of the prostate. Other: No abdominal wall hernia or abnormality. No abdominopelvic ascites. Musculoskeletal: No acute or significant osseous findings. IMPRESSION: 1. No acute CT findings of the abdomen or pelvis to explain abdominal pain, fever, or dysuria. Redemonstrated TURP defect. No urinary tract calculus, hydronephrosis, or other abnormality of the urinary tract. 2. Numerous nonspecific enlarged retroperitoneal, pelvic, and inguinal lymph nodes, unchanged from prior examinations. Largest retroperitoneal nodes measure approximately 2.2 x 1.2 cm (series 3, image 47). Nonspecific enlarged lymph nodes can be seen in the setting of HIV infection per reported history. Electronically Signed   By: Lauralyn PrimesAlex  Bibbey M.D.   On: 02/13/2019 17:04   Dg Chest Port  1 View  Result Date: 02/13/2019 CLINICAL DATA:  Cough fever and dizziness for 2 weeks EXAM: PORTABLE CHEST 1 VIEW COMPARISON:  September 19, 2018 FINDINGS: The heart size and mediastinal contours are within normal limits. Both lungs are clear. The visualized skeletal structures are unremarkable. IMPRESSION: No active disease. Electronically Signed   By: Sherian ReinWei-Chen  Lin M.D.   On: 02/13/2019 15:59    Pending Labs Unresulted Labs (From admission, onward)    Start     Ordered   02/13/19 1521  Blood culture (routine x  2)  BLOOD CULTURE X 2,   STAT     02/13/19 1520   02/13/19 1521  Lactic acid, plasma  Now then every 2 hours,   STAT     02/13/19 1520   02/13/19 1441  Urine C&S  Once,   STAT    Question:  Patient immune status  Answer:  Immunocompromised   02/13/19 1441          Vitals/Pain Today's Vitals   02/13/19 1709 02/13/19 1710 02/13/19 1755 02/13/19 1826  BP: 101/67   102/64  Pulse: 72  70 (!) 58  Resp: 14     Temp: 99.6 F (37.6 C)     TempSrc: Oral     SpO2: 99%  100% 100%  Weight:      Height:      PainSc: 5  8       Isolation Precautions No active isolations  Medications Medications  sodium chloride 0.9 % bolus 1,000 mL (0 mLs Intravenous Stopped 02/13/19 1814)  acetaminophen (TYLENOL) tablet 1,000 mg (1,000 mg Oral Given 02/13/19 1543)  ketorolac (TORADOL) 15 MG/ML injection 15 mg (15 mg Intravenous Given 02/13/19 1621)  piperacillin-tazobactam (ZOSYN) IVPB 3.375 g (0 g Intravenous Stopped 02/13/19 1706)  iohexol (OMNIPAQUE) 300 MG/ML solution 100 mL (100 mLs Intravenous Contrast Given 02/13/19 1643)    Mobility walks Low fall risk   Focused Assessments    R Recommendations: See Admitting Provider Note  Report given to:   Additional Notes:

## 2019-02-14 ENCOUNTER — Other Ambulatory Visit: Payer: Self-pay

## 2019-02-14 DIAGNOSIS — F17210 Nicotine dependence, cigarettes, uncomplicated: Secondary | ICD-10-CM

## 2019-02-14 DIAGNOSIS — Z7251 High risk heterosexual behavior: Secondary | ICD-10-CM

## 2019-02-14 DIAGNOSIS — Z9114 Patient's other noncompliance with medication regimen: Secondary | ICD-10-CM

## 2019-02-14 DIAGNOSIS — R300 Dysuria: Secondary | ICD-10-CM

## 2019-02-14 DIAGNOSIS — Z881 Allergy status to other antibiotic agents status: Secondary | ICD-10-CM

## 2019-02-14 DIAGNOSIS — N12 Tubulo-interstitial nephritis, not specified as acute or chronic: Principal | ICD-10-CM

## 2019-02-14 DIAGNOSIS — R197 Diarrhea, unspecified: Secondary | ICD-10-CM

## 2019-02-14 DIAGNOSIS — B20 Human immunodeficiency virus [HIV] disease: Secondary | ICD-10-CM

## 2019-02-14 DIAGNOSIS — Z87448 Personal history of other diseases of urinary system: Secondary | ICD-10-CM

## 2019-02-14 DIAGNOSIS — B382 Pulmonary coccidioidomycosis, unspecified: Secondary | ICD-10-CM

## 2019-02-14 LAB — MRSA PCR SCREENING: MRSA by PCR: POSITIVE — AB

## 2019-02-14 LAB — CBC
HCT: 36.2 % — ABNORMAL LOW (ref 39.0–52.0)
Hemoglobin: 11.9 g/dL — ABNORMAL LOW (ref 13.0–17.0)
MCH: 30.8 pg (ref 26.0–34.0)
MCHC: 32.9 g/dL (ref 30.0–36.0)
MCV: 93.8 fL (ref 80.0–100.0)
Platelets: 159 10*3/uL (ref 150–400)
RBC: 3.86 MIL/uL — ABNORMAL LOW (ref 4.22–5.81)
RDW: 14.5 % (ref 11.5–15.5)
WBC: 3.6 10*3/uL — ABNORMAL LOW (ref 4.0–10.5)
nRBC: 0 % (ref 0.0–0.2)

## 2019-02-14 LAB — BASIC METABOLIC PANEL
Anion gap: 12 (ref 5–15)
BUN: 11 mg/dL (ref 6–20)
CO2: 21 mmol/L — ABNORMAL LOW (ref 22–32)
Calcium: 8.3 mg/dL — ABNORMAL LOW (ref 8.9–10.3)
Chloride: 102 mmol/L (ref 98–111)
Creatinine, Ser: 0.83 mg/dL (ref 0.61–1.24)
GFR calc Af Amer: >60 mL/min (ref >60)
GFR calc non Af Amer: >60 mL/min (ref >60)
Glucose, Bld: 81 mg/dL (ref 70–99)
Potassium: 3.5 mmol/L (ref 3.5–5.1)
Sodium: 135 mmol/L (ref 135–145)

## 2019-02-14 LAB — T-HELPER CELLS (CD4) COUNT (NOT AT ARMC)
CD4 % Helper T Cell: 8 % — ABNORMAL LOW (ref 33–65)
CD4 T Cell Abs: 61 /uL — ABNORMAL LOW (ref 400–1790)

## 2019-02-14 MED ORDER — SODIUM CHLORIDE 0.9 % IV SOLN
1.0000 g | INTRAVENOUS | Status: DC
  Administered 2019-02-14: 1 g via INTRAVENOUS
  Filled 2019-02-14: qty 1
  Filled 2019-02-14: qty 10

## 2019-02-14 MED ORDER — MUPIROCIN 2 % EX OINT
TOPICAL_OINTMENT | Freq: Two times a day (BID) | CUTANEOUS | Status: DC
  Administered 2019-02-14 – 2019-02-15 (×3): via NASAL
  Filled 2019-02-14: qty 22

## 2019-02-14 MED ORDER — DARUN-COBIC-EMTRICIT-TENOFAF 800-150-200-10 MG PO TABS
1.0000 | ORAL_TABLET | Freq: Every day | ORAL | Status: DC
  Administered 2019-02-15: 1 via ORAL
  Filled 2019-02-14: qty 1

## 2019-02-14 NOTE — Progress Notes (Addendum)
Family Medicine Teaching Service Daily Progress Note Intern Pager: [phone]  Patient name: Jacob ColumbiaDesmond J Granato Medical record number: [id number] Date of birth: [birth year] Age: 33 y.o. Gender: male  Primary Care Provider: Grayce SessionsEdwards, Michelle P, NP Consultants: ID Code Status: Full  Pt Overview and Major Events to Date:  7/22 admitted to ED  Assessment and Plan: Jacob Gutierrez is a 33 y.o. male presenting with flank pain . PMH is significant for HIV/AIDS.   Pyonephritis - stable Patient with couple of weeks of progressing flank pain that worsens with urination. Patient also with foul smelling, dark urine during this time. Negative CT for hydronephrosis, renal calculi or solid lesions in the kidney. Fever of 101.7 on admission. WBC 3.6 (7/23). Vitals stable - ID consulted, appreciate recommendations  - Monitor fevers, Tylenol prn  - Monitor vitals for signs of worsening infection - Continue Zosyn   HIV/AIDS - poorly controlled, noncompliant Patient not taking medications for last month, states he lost them. CD4 count in Dec 2019 was 10 with viral load of 1.2 million. In April 2020 CD4 was 170 and viral load of 1,850. - ID consulted as above  - f/u CD4, and HIV counts - Currently on prophylactic Azithromycin 1200mg  1 time per week and Bactrim 400-80mg  daily  Anal Warts Patient had problem complaint of anal warts on recent clinic visit. Patient declined examination stating he has no current problems with this. - Can examine if patient consents in future during this admission   FEN/GI: Regular PPx: Lovenox  Disposition: Pending ID advice and medical workup  Subjective:  Patient states he generally feels a bit better and his appetite has improved, however he has had 3 bouts of runny diarrhea since last night. He says he attributes this to the antibiotics. He also endorses some chills last night. He still admits to some flank pain especially upon getting out of bed but believes it to be  a bit better. He still has some pain with urination as well and complains of foul smell to his urine.  Objective: Temp:  [97.5 F (36.4 C)-101.7 F (38.7 C)] 99.1 F (37.3 C) (07/23 0619) Pulse Rate:  [56-90] 78 (07/23 0619) Resp:  [14-18] 18 (07/23 0619) BP: (91-111)/(55-74) 100/62 (07/23 0619) SpO2:  [98 %-100 %] 99 % (07/23 0619) Weight:  [93 kg] 93 kg (07/22 1436) Physical Exam: General: Alert and oriented in no apparent distress Heart: Regular rate and rhythm with no murmurs appreciated Lungs: CTA bilaterally, no wheezing Abdomen: Bowel sounds present, no abdominal pain. Does complain of right sided CVA tenderness on exam Skin: Warm and dry Extremities: No lower extremity edema  Patient deferred genital exam.   Laboratory: Recent Labs  Lab 02/13/19 1448 02/14/19 0328  WBC 5.3 3.6*  HGB 11.5* 11.9*  HCT 35.3* 36.2*  PLT 189 159   Recent Labs  Lab 02/13/19 1448 02/14/19 0328  NA 131* 135  K 3.3* 3.5  CL 101 102  CO2 21* 21*  BUN 10 11  CREATININE 0.93 0.83  CALCIUM 8.4* 8.3*  GLUCOSE 100* 81     Imaging/Diagnostic Tests: Ct Abdomen Pelvis W Contrast  Result Date: 02/13/2019 CLINICAL DATA:  Generalized abdominal pain, fever, dysuria EXAM: CT ABDOMEN AND PELVIS WITH CONTRAST TECHNIQUE: Multidetector CT imaging of the abdomen and pelvis was performed using the standard protocol following bolus administration of intravenous contrast. CONTRAST:  100mL OMNIPAQUE IOHEXOL 300 MG/ML  SOLN COMPARISON:  CT abdomen pelvis, 12/09/2017, CT pelvis, 06/08/2018 FINDINGS: Lower chest: No acute abnormality.  Hepatobiliary: No solid liver abnormality is seen. Contracted gallbladder. No gallstones, gallbladder wall thickening, or biliary dilatation. Pancreas: Unremarkable. No pancreatic ductal dilatation or surrounding inflammatory changes. Spleen: Normal in size without significant abnormality. Adrenals/Urinary Tract: Adrenal glands are unremarkable. Kidneys are normal, without renal  calculi, solid lesion, or hydronephrosis. Bladder is unremarkable. Stomach/Bowel: Stomach is within normal limits. Appendix appears normal. No evidence of bowel wall thickening, distention, or inflammatory changes. Vascular/Lymphatic: No significant vascular findings are present. Numerous nonspecific enlarged retroperitoneal, pelvic, and inguinal lymph nodes, unchanged from prior examinations. Largest retroperitoneal nodes measure approximately 2.2 x 1.2 cm (series 3, image 47). Reproductive: Redemonstrated TURP defect of the prostate. Other: No abdominal wall hernia or abnormality. No abdominopelvic ascites. Musculoskeletal: No acute or significant osseous findings. IMPRESSION: 1. No acute CT findings of the abdomen or pelvis to explain abdominal pain, fever, or dysuria. Redemonstrated TURP defect. No urinary tract calculus, hydronephrosis, or other abnormality of the urinary tract. 2. Numerous nonspecific enlarged retroperitoneal, pelvic, and inguinal lymph nodes, unchanged from prior examinations. Largest retroperitoneal nodes measure approximately 2.2 x 1.2 cm (series 3, image 47). Nonspecific enlarged lymph nodes can be seen in the setting of HIV infection per reported history. Electronically Signed   By: Eddie Candle M.D.   On: 02/13/2019 17:04   Dg Chest Port 1 View  Result Date: 02/13/2019 CLINICAL DATA:  Cough fever and dizziness for 2 weeks EXAM: PORTABLE CHEST 1 VIEW COMPARISON:  September 19, 2018 FINDINGS: The heart size and mediastinal contours are within normal limits. Both lungs are clear. The visualized skeletal structures are unremarkable. IMPRESSION: No active disease. Electronically Signed   By: Abelardo Diesel M.D.   On: 02/13/2019 15:59    Lurline Del, MD 02/14/2019, 9:22 AM PGY-1, [place] Intern pager: [phone], text pages welcome

## 2019-02-14 NOTE — Progress Notes (Signed)
CRITICAL VALUE ALERT  Critical Value:  Positive: MRSA on nares  Date & Time Notied:  02/14/19 @ 1040  Provider Notified: Dr Arlyss Queen resident  Orders Received/Actions taken: none

## 2019-02-14 NOTE — Discharge Summary (Signed)
Family Medicine Teaching [facility] Discharge Summary  Patient name: Jacob Gutierrez Medical record number: [id number] Date of birth: [birth year] Age: 33 y.o. Gender: male Date of Admission: 02/13/2019  Date of Discharge: 02/14/19 Admitting Physician: Nestor RampSara L Neal, MD  Primary Care Provider: Grayce SessionsEdwards, Michelle P, NP Consultants: Infectious Disease  Indication for Hospitalization: Pyonephritis  Discharge Diagnoses/Problem List:  HIV/AIDS Anal warts Hx of MRSA positive Hx of homelessness  Disposition: To home, patient is homeless and has been given shelter list.  Discharge Condition: Stable  Discharge Exam:   General: Alert and oriented in no apparent distress Heart: Regular rate and rhythm with no murmurs appreciated Lungs: CTA bilaterally, no wheezing Abdomen: Bowel sounds present, no abdominal pain, right-sided CVA tenderness much improved vs 7/23, only mild tenderness at this point. Skin: Warm and dry Extremities: No lower extremity edema  Brief Hospital Course:  Patient was admitted after experiencing a couple of weeks of progressive back/flank pain and dark, foul smelling urine. He endorsed worsening pain up to the day of his admission including flank pain during urination. On admission his vitals were stable with exception of a 101.40F fever. He was started on Zosyn for suspected pyonephritis due to his uncontrolled/noncompliant HIV status and prior cultures suggesting possible resistance. Electrolytes were significant for a Na+ of 131, K+ of 3.3. CBC showed mild anemia of 11.5. Urinalysis was significant for cloudy urine with large leukocytes and moderate Hgb as well as many bacteria. UA WBC showed >50. CT abdomen showed no hydronephrosis, renal calculi, or solid lesions in the kidneys and an unremarkable bladder. CXR was unremarkable. Patient had not been taking his HIV medications over the past month and was found to have a CD4 count of 61. Infectious disease was consulted  and adjusted his pyonephritis antibiotic to ceftriaxone and ultimately [facility]. Patient improved significantly during this time with regard to his pyonephritis symptoms and was discharged on 7/24 on oral antibiotics with only minor flank pain.  Issues for Follow Up:  1. ID follow up for noncompliant HIV/AIDS, RPR results 2. PCP follow up regarding   Significant Procedures:   Significant Labs and Imaging:  Recent Labs  Lab 02/13/19 1448 02/14/19 0328 02/15/19 0424  WBC 5.3 3.6* 2.6*  HGB 11.5* 11.9* 11.2*  HCT 35.3* 36.2* 33.7*  PLT 189 159 206   Recent Labs  Lab 02/13/19 1448 02/14/19 0328 02/15/19 0424  NA 131* 135 135  K 3.3* 3.5 3.9  CL 101 102 107  CO2 21* 21* 21*  GLUCOSE 100* 81 100*  BUN 10 11 8   CREATININE 0.93 0.83 0.63  CALCIUM 8.4* 8.3* 8.2*      Results/Tests Pending at Time of Discharge: HIV quant. Lab, RPR  Discharge Medications:  Allergies as of 02/15/2019      Reactions   Vancomycin Itching, Swelling, Other (See Comments)   Angioedema, also      Medication List    STOP taking these medications   acetaminophen 500 MG tablet Commonly known as: TYLENOL   azithromycin 600 MG tablet Commonly known as: ZITHROMAX   ibuprofen 200 MG tablet Commonly known as: ADVIL   imiquimod 5 % cream Commonly known as: Aldara   ondansetron 4 MG tablet Commonly known as: ZOFRAN   sulfamethoxazole-trimethoprim 400-80 MG tablet Commonly known as: Bactrim Replaced by: sulfamethoxazole-trimethoprim 800-160 MG tablet   SUMAtriptan 25 MG tablet Commonly known as: IMITREX     TAKE these medications   cefdinir 300 MG capsule Commonly known as: OMNICEF Take 1  capsule (300 mg total) by mouth 2 (two) times daily.   Darunavir-Cobicisctat-Emtricitabine-Tenofovir Alafenamide 800-150-200-10 MG Tabs Commonly known as: SYMTUZA Take 1 tablet by mouth daily with breakfast.   sulfamethoxazole-trimethoprim 800-160 MG tablet Commonly known as: BACTRIM DS Take 1  tablet by mouth daily. Replaces: sulfamethoxazole-trimethoprim 400-80 MG tablet       Discharge Instructions: Please refer to Patient Instructions section of EMR for full details.  Patient was counseled important signs and symptoms that should prompt return to medical care, changes in medications, dietary instructions, activity restrictions, and follow up appointments.   Follow-Up Appointments:   Lurline Del, MD 02/15/2019, 3:10 PM PGY-1, [place]

## 2019-02-14 NOTE — Progress Notes (Signed)
Pt received from ED, AOX4., pleasant and not in distress. Vital signs: temp: 97.5 F oral, BP: 92/69 (76), PR: 56, RR: 18, 02: 100 RA. Oriented to room and plan of care. Call bell at reach, left lying comfortably in bed on Nacl 167ml/hr on the R hand. Will continue to monitor

## 2019-02-14 NOTE — Consult Note (Signed)
[facility]ious Disease    Date of Admission:  02/13/2019     Total days of antibiotics 2               Reason for Consult:  Pyelonephritis/HIV/AIDS  Referring Provider: Nori Riis  Primary Care Provider: Kerin Perna, NP   Assessment/Plan:  Jacob Gutierrez is a 33 year old male with poorly controlled HIV/AIDS admitted with a chief complaint of fever and bilateral flank pain with concern for pyelonephritis.  CT imaging without evidence of renal calculi or hydronephrosis.  He did have previous TURP procedure due to prostate abscess and was recently treated for suspected prostatitis.  Currently feeling better although having diarrhea. Restarted on azithromycin for MAC prophylaxis and Bactrim for PCP prophylaxis.   Pyelonephritis -  Urine culture with >100,000 colonies per ml and identification pending which would support pyelonephritis. He does have history of prostatitis requiring TURP and recent treatment for prostatitis with levofloxacin. Will narrow antibiotic therapy to ceftriaxone pending culture results, unlikely penicillin allergic given tolerance of piperacillin-tazobactam.   High Risk Sexual Behavior - sexually active and recently informed by previous partner of positive syphilis test. Check RPR. Consider Bicillin injections.  AIDS/HIV - Less than optimal adhere to his ART regimen for multiple social reasons. Discussed importance of taking medications to reduce risk of OI and progressive HIV disease. Will restart Symtuza and continue Bactrim given low CD4 count of 64. Discontinue azithromycin with starting of Symtuza. Will continue to work with [employer] May benefit from referral to [facility].   Active Problems:   Pyelonephritis   . azithromycin  1,200 mg Oral Weekly  . enoxaparin (LOVENOX) injection  40 mg Subcutaneous Q24H  . sulfamethoxazole-trimethoprim  1 tablet Oral Daily     HPI: Jacob Gutierrez is a 33 y.o. 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   On admission he was febrile at 101.7 with WBC count of 3.6. Urine was positive for hemoglobin, leukocytes, and WBC of >50. Urine culture remains is in process. Blood cultures without growth in <24 hours. CT imaging of the abdomen and pelvis without evidence of renal calculi, solid lesions or hydronephrosis and redemonstration of previous TURP defect of the prostate. Current antimicrobial therapy with piperacillin-tazobactam, sulfamethoxazole and azithromycin.   Jacob Gutierrez was last seen in the ID office on 10/24/18 for follow up if his HIV disease where he was previously treated with a 21 day course levofloxacin for presumed prostatitis. At the time he had good adherence and tolerance to his ART regimen of Symtuza supplemented with Bactrim. Viral load at the time was found to be 1,850 with CD4 count of 170. His most recent CD4 count repeated on 02/13/19 is 61. Per his report, Jacob Gutierrez has been off his medication for over a month following misplacing them.  He has not picked up medication from the pharmacy due to issues with being homeless and other personal issues.  Jacob Gutierrez mentions allergies to penicillin from when he was a baby with his mother informing him not to take penicillins again with unknown reaction.  Of note he has received piperacillin-tazobactam without complication at present.  He does have increased bowel movements since starting the medication.  Please inform of a partner with a positive syphilis test.   Review of Systems:  Review of Systems  Constitutional: Negative for chills, fever and weight loss.  Respiratory: Negative for cough, shortness of breath and wheezing.   Cardiovascular: Negative for chest pain and leg swelling.  Gastrointestinal:  Positive for diarrhea. Negative for abdominal pain, constipation, nausea and vomiting.  Genitourinary: Positive for dysuria.  Skin: Negative for rash.     Past Medical History:  Diagnosis Date  . AIDS (acquired immune deficiency syndrome) ([place])   . AIDS (acquired immune deficiency syndrome) ([place])   . 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 H/O coccidioidomycosis    pulmonary   . HIV disease ([place])   . Past history of allergy to penicillin-type antibiotic 07-03-2012   desensitization  . PNA (pneumonia)   . Shigella gastroenteritis   . Syphilis    history /treated     Social History   Tobacco Use  . Smoking status: Current Every Day Smoker    Packs/day: 0.30    Types: Cigarettes  . Smokeless tobacco: Never Used  . Tobacco comment: cutting back  Substance Use Topics  . Alcohol use: Yes    Alcohol/week: 1.0 standard drinks    Types: 1 Standard drinks or equivalent per week    Comment: whiskey occasionally   . Drug use: Yes    Frequency: 1.0 times per week    Types: Marijuana    Family History  Problem Relation Age of Onset  . Hypertension Mother   . Lupus Maternal Grandmother   . Cancer Paternal Grandfather     Allergies  Allergen Reactions  . Vancomycin Itching, Swelling and Other (See Comments)    Angioedema, also    OBJECTIVE: Blood pressure 100/62, pulse 78, temperature 99.1 F (37.3 C), temperature source Oral, resp. rate 18, height _0  (1.854 m), weight 93 kg, SpO2 99 %.  Physical Exam Constitutional:      General: He is not in acute distress.    Appearance: He is well-developed.  Eyes:     Conjunctiva/sclera: Conjunctivae normal.  Neck:     Musculoskeletal: Neck supple.  Cardiovascular:     Rate and Rhythm: Normal rate and regular rhythm.     Heart sounds: Normal heart sounds. No murmur. No friction rub. No gallop.   Pulmonary:     Effort: Pulmonary effort is normal. No respiratory distress.     Breath  sounds: Normal breath sounds. No wheezing or rales.  Chest:     Chest wall: No tenderness.  Abdominal:     General: Bowel sounds are normal.     Palpations: Abdomen is soft.     Tenderness: There is no abdominal tenderness.  Lymphadenopathy:     Cervical: No cervical adenopathy.  Skin:    General: Skin is warm and dry.     Findings: No rash.  Neurological:     Mental Status: He is alert and oriented to person, place, and time.  Psychiatric:        Mood and Affect: Mood normal.     Lab Results Lab Results  Component Value Date   WBC 3.6 (L) 02/14/2019   HGB 11.9 (L) 02/14/2019   HCT 36.2 (L) 02/14/2019   MCV 93.8 02/14/2019   PLT 159 02/14/2019    Lab Results  Component Value Date   CREATININE 0.83 02/14/2019   BUN 11 02/14/2019   NA 135 02/14/2019   K 3.5 02/14/2019   CL 102 02/14/2019   CO2 21 (L) 02/14/2019    Lab Results  Component Value Date   ALT 52 (H) 09/20/2018   AST 53 (H) 09/20/2018   ALKPHOS 66 09/20/2018   BILITOT 0.7 09/20/2018     Microbiology: Recent Results (from the past 240 hour(s))  Blood culture (routine x 2)     Status: None (Preliminary result)   Collection Time: 02/13/19  3:58 PM   Specimen: BLOOD  Result Value Ref Range Status   Specimen Description BLOOD LEFT ANTECUBITAL  Final   Special Requests   Final    BOTTLES DRAWN AEROBIC AND ANAEROBIC Blood Culture adequate volume   Culture   Final    NO GROWTH < 24 HOURS Performed at [facility], [place] [address] [place] [address]    Report Status PENDING  Incomplete  SARS Coronavirus 2 (CEPHEID - Performed in [facility] hospital lab), Hosp Order     Status: None   Collection Time: 02/13/19  3:58 PM   Specimen: Nasopharyngeal Swab  Result Value Ref Range Status   SARS Coronavirus 2 NEGATIVE NEGATIVE Final    Comment: (NOTE) If result is NEGATIVE SARS-CoV-2 target nucleic acids are NOT DETECTED. The SARS-CoV-2 RNA is generally detectable in upper and lower   respiratory specimens during the acute phase of infection. The lowest  concentration of SARS-CoV-2 viral copies this assay can detect is 250  copies / mL. A negative result does not preclude SARS-CoV-2 infection  and should not be used as the sole basis for treatment or other  patient management decisions.  A negative result may occur with  improper specimen collection / handling, submission of specimen other  than nasopharyngeal swab, presence of viral mutation(s) within the  areas targeted by this assay, and inadequate number of viral copies  (<250 copies / mL). A negative result must be combined with clinical  observations, patient history, and epidemiological information. If result is POSITIVE SARS-CoV-2 target nucleic acids are DETECTED. The SARS-CoV-2 RNA is generally detectable in upper and lower  respiratory specimens dur ing the acute phase of infection.  Positive  results are indicative of active infection with SARS-CoV-2.  Clinical  correlation with patient history and other diagnostic information is  necessary to determine patient infection status.  Positive results do  not rule out bacterial infection or co-infection with other viruses. If result is PRESUMPTIVE POSTIVE SARS-CoV-2 nucleic acids MAY BE PRESENT.   A presumptive positive result was obtained on the submitted specimen  and confirmed on repeat testing.  While 2019 novel coronavirus  (SARS-CoV-2) nucleic acids may be present in the submitted sample  additional confirmatory testing may be necessary for epidemiological  and / or clinical management purposes  to differentiate between  SARS-CoV-2 and other Sarbecovirus currently known to infect humans.  If clinically indicated additional testing with an alternate test  methodology [phone]) is advised. The SARS-CoV-2 RNA is generally  detectable in upper and lower respiratory sp ecimens during the acute  phase of infection. The expected result is Negative. Fact  Sheet for Patients:  StrictlyIdeas.no Fact Sheet for Healthcare Providers: BankingDealers.co.za This test is not yet approved or cleared by the [place] FDA and has been authorized for detection and/or diagnosis of SARS-CoV-2 by FDA under an Emergency Use Authorization (EUA).  This EUA will remain in effect (meaning this test can be used) for the duration of the COVID-19 declaration under Section 564(b)(1) of the Act, 21 U.S.C. section 360bbb-3(b)(1), unless the authorization is terminated or revoked sooner. Performed at [facility], [place]  [address] [place], [place] [address]   Blood culture (routine x 2)     Status: None (Preliminary result)   Collection Time: 02/13/19  4:10 PM   Specimen: BLOOD RIGHT HAND  Result Value Ref Range Status   Specimen Description BLOOD RIGHT HAND  Final   Special Requests   Final    BOTTLES DRAWN AEROBIC AND ANAEROBIC Blood Culture results may not be optimal due to an excessive volume of blood received in culture bottles   Culture   Final    NO GROWTH < 24 HOURS Performed at [facility], [place] [address], [place], [place] [address]    Report Status PENDING  Incomplete     [name], [facility] Pager  02/14/2019  10:19 AM

## 2019-02-15 ENCOUNTER — Encounter (HOSPITAL_COMMUNITY): Payer: Self-pay | Admitting: Surgery

## 2019-02-15 DIAGNOSIS — Z79899 Other long term (current) drug therapy: Secondary | ICD-10-CM

## 2019-02-15 LAB — RPR: RPR Ser Ql: REACTIVE — AB

## 2019-02-15 LAB — BASIC METABOLIC PANEL
Anion gap: 7 (ref 5–15)
BUN: 8 mg/dL (ref 6–20)
CO2: 21 mmol/L — ABNORMAL LOW (ref 22–32)
Calcium: 8.2 mg/dL — ABNORMAL LOW (ref 8.9–10.3)
Chloride: 107 mmol/L (ref 98–111)
Creatinine, Ser: 0.63 mg/dL (ref 0.61–1.24)
GFR calc Af Amer: >60 mL/min (ref >60)
GFR calc non Af Amer: >60 mL/min (ref >60)
Glucose, Bld: 100 mg/dL — ABNORMAL HIGH (ref 70–99)
Potassium: 3.9 mmol/L (ref 3.5–5.1)
Sodium: 135 mmol/L (ref 135–145)

## 2019-02-15 LAB — URINE CULTURE: Culture: >=100000 — AB

## 2019-02-15 LAB — CBC
HCT: 33.7 % — ABNORMAL LOW (ref 39.0–52.0)
Hemoglobin: 11.2 g/dL — ABNORMAL LOW (ref 13.0–17.0)
MCH: 30.9 pg (ref 26.0–34.0)
MCHC: 33.2 g/dL (ref 30.0–36.0)
MCV: 92.8 fL (ref 80.0–100.0)
Platelets: 206 10*3/uL (ref 150–400)
RBC: 3.63 MIL/uL — ABNORMAL LOW (ref 4.22–5.81)
RDW: 14.6 % (ref 11.5–15.5)
WBC: 2.6 10*3/uL — ABNORMAL LOW (ref 4.0–10.5)
nRBC: 0 % (ref 0.0–0.2)

## 2019-02-15 LAB — RPR, QUANT+TP ABS (REFLEX)
Rapid Plasma Reagin, Quant: 1:1 {titer} — ABNORMAL HIGH
T Pallidum Abs: REACTIVE — AB

## 2019-02-15 MED ORDER — SULFAMETHOXAZOLE-TRIMETHOPRIM 800-160 MG PO TABS: 1.0000 | ORAL_TABLET | Freq: Every day | ORAL | Status: DC

## 2019-02-15 MED ORDER — DARUN-COBIC-EMTRICIT-TENOFAF 800-150-200-10 MG PO TABS: 1.0000 | ORAL_TABLET | Freq: Every day | ORAL | 0 refills | Status: DC

## 2019-02-15 MED ORDER — SULFAMETHOXAZOLE-TRIMETHOPRIM 800-160 MG PO TABS: 1.0000 | ORAL_TABLET | Freq: Every day | ORAL | 0 refills | Status: DC

## 2019-02-15 MED ORDER — CEFDINIR 300 MG PO CAPS
300.0000 mg | ORAL_CAPSULE | Freq: Two times a day (BID) | ORAL | Status: DC
  Administered 2019-02-15: 300 mg via ORAL
  Filled 2019-02-15 (×2): qty 1

## 2019-02-15 MED ORDER — CEFDINIR 300 MG PO CAPS: 300.0000 mg | ORAL_CAPSULE | Freq: Two times a day (BID) | ORAL | 0 refills | Status: DC

## 2019-02-15 MED FILL — CEFDINIR 300 MG CAPSULE: 300 | 5 days supply | Qty: 10 | Fill #0

## 2019-02-15 MED FILL — SULFAMETHOXAZOLE-TMP DS TAB: 800-160 | 30 days supply | Qty: 30 | Fill #0

## 2019-02-15 MED FILL — SYMTUZA 800-150-200-10 MG T: 800-150-200 | 30 days supply | Qty: 30 | Fill #0

## 2019-02-15 NOTE — Progress Notes (Signed)
Family Medicine Teaching Service Daily Progress Note Intern Pager: [phone]  Patient name: Jacob Gutierrez Medical record number: [id number] Date of birth: 08/08/1985 Age: 33 y.o. Gender: male  Primary Care Provider: Grayce SessionsEdwards, Michelle P, NP Consultants: ID Code Status: Full  Pt Overview and Major Events to Date:  7/22 admitted to ED  Assessment and Plan: Jacob Gutierrez is a 33 y.o. male presenting with flank pain . PMH is significant for HIV/AIDS.   Pyonephritis - stable Patient with couple of weeks of progressing flank pain that worsens with urination. Patient also with foul smelling, dark urine during this time. Negative CT for hydronephrosis, renal calculi or solid lesions in the kidney. Fever of 101.7 on admission. WBC 3.6 (7/23). 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 - Monitor fevers, Tylenol prn  - Monitor vitals for signs of worsening infection   HIV/AIDS - poorly controlled, noncompliant Patient not taking medications for last month, states he lost them. CD4 count in Dec 2019 was 10 with viral load of 1.2 million. In April 2020 CD4 was 170 and viral load of 1,850. Current CD4 61. HIV quant pending.  - ID consulted as above  - HIV quant pending - RPR pending   Anal Warts Patient had problem complaint of anal warts on recent clinic visit. Patient declined examination stating he has no current problems with this. - Can examine if patient consents in future during this admission   FEN/GI: Regular PPx: Lovenox  Disposition: Pending ID advice and medical workup  Subjective:  Patient states he is continuing to improve. Still some chills and diarrhea but his flank pain is better. He states he did have to have  his sheets changed last night because of significant night sweats which is new for him.   Objective: Temp:  [98.4 F (36.9 C)-98.8 F (37.1 C)] 98.4 F (36.9 C) (07/23 2102) Pulse Rate:  [58-76] 76 (07/23 2102) Resp:  [18] 18 (07/23 2102) BP: (105-106)/(61-68) 106/68 (07/23 2102) SpO2:  [100 %] 100 % (07/23 2102) Physical Exam: General: Alert and oriented in no apparent distress Heart: Regular rate and rhythm with no murmurs appreciated Lungs: CTA bilaterally, no wheezing Abdomen: Bowel sounds present, no abdominal pain, right-sided CVA tenderness much improved vs yesterday. Skin: Warm and dry Extremities: No lower extremity edema    Laboratory: Recent Labs  Lab 02/13/19 1448 02/14/19 0328 02/15/19 0424  WBC 5.3 3.6* 2.6*  HGB 11.5* 11.9* 11.2*  HCT 35.3* 36.2* 33.7*  PLT 189 159 206   Recent Labs  Lab 02/13/19 1448 02/14/19 0328 02/15/19 0424  NA 131* 135 135  K 3.3* 3.5 3.9  CL 101 102 107  CO2 21* 21* 21*  BUN 10 11 8   CREATININE 0.93 0.83 0.63  CALCIUM 8.4* 8.3* 8.2*  GLUCOSE 100* 81 100*     Imaging/Diagnostic Tests: Ct Abdomen Pelvis W Contrast  Result Date: 02/13/2019 CLINICAL DATA:  Generalized abdominal pain, fever, dysuria EXAM: CT ABDOMEN AND PELVIS WITH CONTRAST TECHNIQUE: Multidetector CT imaging of the abdomen and pelvis was performed using the standard protocol following bolus administration of intravenous contrast. CONTRAST:  100mL OMNIPAQUE IOHEXOL 300 MG/ML  SOLN COMPARISON:  CT abdomen pelvis, 12/09/2017, CT pelvis, 06/08/2018 FINDINGS: Lower chest: No acute abnormality. Hepatobiliary: No solid liver abnormality is  seen. Contracted gallbladder. No gallstones, gallbladder wall thickening, or biliary dilatation. Pancreas: Unremarkable. No pancreatic ductal dilatation or surrounding inflammatory changes. Spleen: Normal in size without significant abnormality. Adrenals/Urinary Tract: Adrenal glands are unremarkable. Kidneys are normal, without renal  calculi, solid lesion, or hydronephrosis. Bladder is unremarkable. Stomach/Bowel: Stomach is within normal limits. Appendix appears normal. No evidence of bowel wall thickening, distention, or inflammatory changes. Vascular/Lymphatic: No significant vascular findings are present. Numerous nonspecific enlarged retroperitoneal, pelvic, and inguinal lymph nodes, unchanged from prior examinations. Largest retroperitoneal nodes measure approximately 2.2 x 1.2 cm (series 3, image 47). Reproductive: Redemonstrated TURP defect of the prostate. Other: No abdominal wall hernia or abnormality. No abdominopelvic ascites. Musculoskeletal: No acute or significant osseous findings. IMPRESSION: 1. No acute CT findings of the abdomen or pelvis to explain abdominal pain, fever, or dysuria. Redemonstrated TURP defect. No urinary tract calculus, hydronephrosis, or other abnormality of the urinary tract. 2. Numerous nonspecific enlarged retroperitoneal, pelvic, and inguinal lymph nodes, unchanged from prior examinations. Largest retroperitoneal nodes measure approximately 2.2 x 1.2 cm (series 3, image 47). Nonspecific enlarged lymph nodes can be seen in the setting of HIV infection per reported history. Electronically Signed   By: Eddie Candle M.D.   On: 02/13/2019 17:04   Dg Chest Port 1 View  Result Date: 02/13/2019 CLINICAL DATA:  Cough fever and dizziness for 2 weeks EXAM: PORTABLE CHEST 1 VIEW COMPARISON:  September 19, 2018 FINDINGS: The heart size and mediastinal contours are within normal limits. Both lungs are clear. The visualized skeletal structures are unremarkable. IMPRESSION: No active disease. Electronically Signed   By: Abelardo Diesel M.D.   On: 02/13/2019 15:59    Lurline Del, MD 02/15/2019, 6:43 AM PGY-1, [place] Intern pager: [phone], text pages welcome

## 2019-02-15 NOTE — TOC Initial Note (Signed)
Transition of Care [facility](TOC) - Initial/Assessment Note    Patient Details  Name: Jacob Gutierrez MRN: [id number] Date of Birth: 06-Apr-1986  Transition of Care [facility](TOC) CM/SW Contact:    Doy HutchingIsabel H Alireza Pollack, LCSWA Phone Number: 02/15/2019, 3:05 PM  Clinical Narrative:                 CSW spoke with pt at bedside, was alerted by MD that pt is homeless and stable for discharge. Introduced self, role, reason for visit. Pt aware he is discharged but frustrated that he didn't know he was being discharged today and does not have anywhere to go. Pt provided with shelter lists but states "[facility]res [facility] and they have a waitlist." He declines shelter in [place] or [place] due to having employment [place] in [place]. He has a shift tomorrow. We discussed [facility] being open Mon-Fri and offered a cab voucher there. Pt would like some time to think and get a letter from MD for work.   Patient Goals and CMS Choice Patient states their goals for this hospitalization and ongoing recovery are:: to get back to work [employer].gov Compare Post Acute Care list provided to:: (n/a) Choice offered to / list presented to : Patient  Expected Discharge Plan and Services   In-house Referral: Clinical Social Work Discharge Planning Services: Medication Assistance Post Acute Care Choice: NA Living arrangements for the past 2 months: No permanent address Expected Discharge Date: 02/15/19                                    Prior Living Arrangements/Services Living arrangements for the past 2 months: No permanent address Lives with:: Self Patient language and need for interpreter reviewed:: Yes(no needs)        Need for Family Participation in Patient Care: No (Comment) Care giver support system in place?: No (comment)(pt states he does not have anywhere to go) Current home services: Other (comment)(ID f/u) Criminal Activity/Legal Involvement Pertinent to Current Situation/Hospitalization: No -  Comment as needed  Activities of Daily Living Home Assistive Devices/Equipment: None ADL Screening (condition at time of admission) Patient's cognitive ability adequate to safely complete daily activities?: Yes Is the patient deaf or have difficulty hearing?: No Does the patient have difficulty seeing, even when wearing glasses/contacts?: No Does the patient have difficulty concentrating, remembering, or making decisions?: No Patient able to express need for assistance with ADLs?: Yes Does the patient have difficulty dressing or bathing?: No Independently performs ADLs?: Yes (appropriate for developmental age) Does the patient have difficulty walking or climbing stairs?: No Weakness of Legs: None Weakness of Arms/Hands: None  Permission Sought/Granted   Permission granted to share information with : No              Emotional Assessment Appearance:: Appears stated age Attitude/Demeanor/Rapport: Engaged Affect (typically observed): Blunt, Frustrated, Accepting Orientation: : Oriented to Place, Oriented to Self, Oriented to  Time, Oriented to Situation Alcohol / Substance Use: Alcohol Use, Illicit Drugs Psych Involvement: No (comment)  Admission diagnosis:  Pyelonephritis [N12] Patient Active Problem List   Diagnosis Date Noted  . Pyelonephritis 02/13/2019  . Prostatitis 10/05/2018  . Leukopenia 09/20/2018  . Thrombocytopenia (HCC) 09/20/2018  . Hyponatremia 09/20/2018  . 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 Headache 04/27/2018  . Condyloma 04/24/2018  . Protein-calorie malnutrition, severe 12/12/2017  . Personal history of MRSA (methicillin resistant Staphylococcus aureus) 12/09/2017  . History  of ESBL E. coli infection 12/09/2017  . AIDS (acquired immune deficiency syndrome) ([place])   . Homelessness   . 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 Tobacco use disorder 01/02/2014  . Human immunodeficiency virus (HIV) disease ([place]) 01/01/2014   PCP:  Kerin Perna, NP Pharmacy:    [facility] [place], [address] [place] [address] Phone: [phone] Fax: [phone]  [facility] [place] [place] - [address] [place] [address] Phone: [phone] Fax: [phone]     Social Determinants of Health (SDOH) Interventions    Readmission Risk Interventions No flowsheet data found.

## 2019-02-15 NOTE — TOC Transition Note (Signed)
Transition of Care [facility]) - CM/SW Discharge Note   Patient Details  Name: Jacob Gutierrez MRN: [id number] Date of Birth: Sep 15, 1985  Transition of [facility]) CM/SW Contact:  Alexander Mt, [place] Phone Number: 02/15/2019, 3:15 PM   Clinical Narrative:    Pt accepted a cab voucher to [facility]. Voucher sent up to bedside RN, Remo Lipps.   Final next level of care: Homeless Shelter  Barriers: Barriers Resolved   Patient Goals and CMS Choice Patient states their goals for this hospitalization and ongoing recovery are:: to get back to work [employer] Compare Post Acute Care list provided to:: (n/a) Choice offered to / list presented to : Patient  Discharge Placement     Discharge Plan and Services In-house Referral: Clinical Social Work Discharge Planning Services: Medication Assistance Post Acute Care Choice: NA                               Social Determinants of Health (SDOH) Interventions     Readmission Risk Interventions No flowsheet data found.

## 2019-02-15 NOTE — Progress Notes (Signed)
Patient discharged to home with instructions and medications. 

## 2019-02-15 NOTE — Progress Notes (Signed)
[facility]   Reason for visit: Follow up on HIV, pyelonephritis  Interval History: remains afebrile, WBC 2.6.  Improved po intake.  No new complaints.  Improved abdominal pain.    Physical Exam: Constitutional:  Vitals:   02/14/19 1755 02/14/19 2102  BP: 105/61 106/68  Pulse: (!) 58 76  Resp: 18 18  Temp: 98.8 F (37.1 C) 98.4 F (36.9 C)  SpO2: 100% 100%   patient appears in NAD, eating Eyes: anicteric HENT: no thrush Respiratory: Normal respiratory effort; CTA B Cardiovascular: RRR GI: soft, nt, nd  Review of Systems: Constitutional: negative for fevers and chills Gastrointestinal: negative for nausea Musculoskeletal: negative for myalgias and arthralgias  Lab Results  Component Value Date   WBC 2.6 (L) 02/15/2019   HGB 11.2 (L) 02/15/2019   HCT 33.7 (L) 02/15/2019   MCV 92.8 02/15/2019   PLT 206 02/15/2019    Lab Results  Component Value Date   CREATININE 0.63 02/15/2019   BUN 8 02/15/2019   NA 135 02/15/2019   K 3.9 02/15/2019   CL 107 02/15/2019   CO2 21 (L) 02/15/2019    Lab Results  Component Value Date   ALT 52 (H) 09/20/2018   AST 53 (H) 09/20/2018   ALKPHOS 66 09/20/2018     Microbiology: Recent Results (from the past 240 hour(s))  Urine C&S     Status: Abnormal   Collection Time: 02/13/19  3:22 PM   Specimen: Urine, Clean Catch  Result Value Ref Range Status   Specimen Description URINE, CLEAN CATCH  Final   Special Requests   Final    Immunocompromised Performed at [facility], [address] [place], [place] [address]    Culture >=100,000 COLONIES/mL ESCHERICHIA COLI (A)  Final   Report Status 02/15/2019 FINAL  Final   Organism ID, Bacteria ESCHERICHIA COLI (A)  Final      Susceptibility   Escherichia coli - MIC*    AMPICILLIN >=32 RESISTANT Resistant     CEFAZOLIN <=4 SENSITIVE Sensitive     CEFTRIAXONE <=1 SENSITIVE Sensitive     CIPROFLOXACIN <=0.25 SENSITIVE Sensitive     GENTAMICIN <=1  SENSITIVE Sensitive     IMIPENEM <=0.25 SENSITIVE Sensitive     NITROFURANTOIN <=16 SENSITIVE Sensitive     TRIMETH/SULFA >=320 RESISTANT Resistant     AMPICILLIN/SULBACTAM 4 SENSITIVE Sensitive     PIP/TAZO <=4 SENSITIVE Sensitive     Extended ESBL NEGATIVE Sensitive     * >=100,000 COLONIES/mL ESCHERICHIA COLI  Blood culture (routine x 2)     Status: None (Preliminary result)   Collection Time: 02/13/19  3:58 PM   Specimen: BLOOD  Result Value Ref Range Status   Specimen Description BLOOD LEFT ANTECUBITAL  Final   Special Requests   Final    BOTTLES DRAWN AEROBIC AND ANAEROBIC Blood Culture adequate volume   Culture   Final    NO GROWTH < 24 HOURS Performed at [facility], [address] [place], [place] [address]    Report Status PENDING  Incomplete  SARS Coronavirus 2 (CEPHEID - Performed in [facility] hospital lab), Hosp Order     Status: None   Collection Time: 02/13/19  3:58 PM   Specimen: Nasopharyngeal Swab  Result Value Ref Range Status   SARS Coronavirus 2 NEGATIVE NEGATIVE Final    Comment: (NOTE) If result is NEGATIVE SARS-CoV-2 target nucleic acids are NOT DETECTED. The SARS-CoV-2 RNA is generally detectable in upper  and lower  respiratory specimens during the acute phase of infection. The lowest  concentration of SARS-CoV-2 viral copies this assay can detect is 250  copies / mL. A negative result does not preclude SARS-CoV-2 infection  and should not be used as the sole basis for treatment or other  patient management decisions.  A negative result may occur with  improper specimen collection / handling, submission of specimen other  than nasopharyngeal swab, presence of viral mutation(s) within the  areas targeted by this assay, and inadequate number of viral copies  (<250 copies / mL). A negative result must be combined with clinical  observations, patient history, and epidemiological information. If result is POSITIVE SARS-CoV-2 target nucleic acids  are DETECTED. The SARS-CoV-2 RNA is generally detectable in upper and lower  respiratory specimens dur ing the acute phase of infection.  Positive  results are indicative of active infection with SARS-CoV-2.  Clinical  correlation with patient history and other diagnostic information is  necessary to determine patient infection status.  Positive results do  not rule out bacterial infection or co-infection with other viruses. If result is PRESUMPTIVE POSTIVE SARS-CoV-2 nucleic acids MAY BE PRESENT.   A presumptive positive result was obtained on the submitted specimen  and confirmed on repeat testing.  While 2019 novel coronavirus  (SARS-CoV-2) nucleic acids may be present in the submitted sample  additional confirmatory testing may be necessary for epidemiological  and / or clinical management purposes  to differentiate between  SARS-CoV-2 and other Sarbecovirus currently known to infect humans.  If clinically indicated additional testing with an alternate test  methodology [phone] is advised. The SARS-CoV-2 RNA is generally  detectable in upper and lower respiratory sp ecimens during the acute  phase of infection. The expected result is Negative. Fact Sheet for Patients:  BoilerBrush.com.cyhttps://www.fda.gov/media/136312/download Fact Sheet for Healthcare Providers: https://pope.com/https://www.fda.gov/media/136313/download This test is not yet approved or cleared by the [place] FDA and has been authorized for detection and/or diagnosis of SARS-CoV-2 by FDA under an Emergency Use Authorization (EUA).  This EUA will remain in effect (meaning this test can be used) for the duration of the COVID-19 declaration under Section 564(b)(1) of the Act, 21 U.S.C. section 360bbb-3(b)(1), unless the authorization is terminated or revoked sooner. Performed at [facility], [address] [place], [place] [address]   Blood culture (routine x 2)     Status: None (Preliminary result)   Collection Time: 02/13/19   4:10 PM   Specimen: BLOOD RIGHT HAND  Result Value Ref Range Status   Specimen Description BLOOD RIGHT HAND  Final   Special Requests   Final    BOTTLES DRAWN AEROBIC AND ANAEROBIC Blood Culture results may not be optimal due to an excessive volume of blood received in culture bottles   Culture   Final    NO GROWTH < 24 HOURS Performed at [facility], [address] [place], [place] [address]    Report Status PENDING  Incomplete  MRSA PCR Screening     Status: Abnormal   Collection Time: 02/14/19  8:24 AM   Specimen: Nasal Mucosa; Nasopharyngeal  Result Value Ref Range Status   MRSA by PCR POSITIVE (A) NEGATIVE Final    Comment:        The GeneXpert MRSA Assay (FDA approved for NASAL specimens only), is one component of a comprehensive MRSA colonization surveillance program. It is not intended to diagnose MRSA infection nor to guide or monitor treatment for MRSA infections.  RESULT CALLED TO, READ BACK BY AND VERIFIED WITH: Marjory Sneddon RN 10:35 02/14/19 ([email or web address]) Performed at [facility], [place] [address] [place], [place] [address]     Impression/Plan:  1. Pyelonephritis - fever, urinary complaints, positive E coli in urine.   Continues to improve.  I have changed him to cefdinir oral twice a day based on sensitivities.   He can take this for 5 more days.    2.  HIV/AIDS - on Symtuza again and counseled on needing to stay on it.  He is tolerating it well.    3.  OI risk - with his low CD4 count, he is at risk for opportunistic infections.  He will continue with Bactrim DS once daily.   No indication for azithromycin  He has follow up arranged with [place] next week with Janene Madeira, NP 7/30 at 4pm  All three medications above will be provided by the [place] prior to discharge  I will sign off, call with any questions.

## 2019-02-15 NOTE — Discharge Instructions (Signed)
Discharge summary:  - You were admitted for a likely kidney infection, please finish the course of antibiotics you were prescribed to make sure the infection does not return. - Please follow up with your infectious disease physician regularly as you continue your HIV medications - Please continue to take your HIV medications as these have a large positive impact on your health long term.  - Please follow up with your PCP within one week for recheck, including new medications such as your blood pressure medicine - Please return if you experience similar symptoms, high fevers, confusion, chest pain that does not subside within 15 minutes or shortness of breath.

## 2019-02-16 LAB — HIV-1 RNA QUANT-NO REFLEX-BLD
HIV 1 RNA Quant: 290000 copies/mL
LOG10 HIV-1 RNA: 5.462 log10copy/mL

## 2019-02-18 LAB — CULTURE, BLOOD (ROUTINE X 2)
Culture: NO GROWTH
Culture: NO GROWTH
Special Requests: ADEQUATE

## 2019-02-21 ENCOUNTER — Inpatient Hospital Stay: Payer: Medicaid Other | Admitting: Infectious Diseases

## 2019-03-13 ENCOUNTER — Inpatient Hospital Stay: Payer: Medicaid Other | Admitting: Infectious Diseases

## 2019-04-11 ENCOUNTER — Ambulatory Visit: Payer: Medicaid Other | Admitting: Infectious Diseases

## 2019-04-12 ENCOUNTER — Encounter (HOSPITAL_COMMUNITY): Payer: Self-pay

## 2019-04-12 ENCOUNTER — Inpatient Hospital Stay (HOSPITAL_COMMUNITY)
Admission: EM | Admit: 2019-04-12 | Discharge: 2019-04-16 | DRG: 690 | Disposition: A | Discharge status: Home / Self Care | Payer: Medicaid Other | Attending: Internal Medicine | Admitting: Internal Medicine

## 2019-04-12 DIAGNOSIS — R0600 Dyspnea, unspecified: Secondary | ICD-10-CM

## 2019-04-12 DIAGNOSIS — B20 Human immunodeficiency virus [HIV] disease: Secondary | ICD-10-CM | POA: Diagnosis present

## 2019-04-12 DIAGNOSIS — D72819 Decreased white blood cell count, unspecified: Secondary | ICD-10-CM | POA: Diagnosis present

## 2019-04-12 DIAGNOSIS — B9620 Unspecified Escherichia coli [E. coli] as the cause of diseases classified elsewhere: Secondary | ICD-10-CM | POA: Diagnosis present

## 2019-04-12 DIAGNOSIS — Z9114 Patient's other noncompliance with medication regimen: Secondary | ICD-10-CM

## 2019-04-12 DIAGNOSIS — N10 Acute tubulo-interstitial nephritis: Principal | ICD-10-CM | POA: Diagnosis present

## 2019-04-12 DIAGNOSIS — Z888 Allergy status to other drugs, medicaments and biological substances status: Secondary | ICD-10-CM

## 2019-04-12 DIAGNOSIS — R000 Tachycardia, unspecified: Secondary | ICD-10-CM | POA: Diagnosis present

## 2019-04-12 DIAGNOSIS — Z20828 Contact with and (suspected) exposure to other viral communicable diseases: Secondary | ICD-10-CM | POA: Diagnosis present

## 2019-04-12 DIAGNOSIS — R7881 Bacteremia: Secondary | ICD-10-CM | POA: Diagnosis present

## 2019-04-12 DIAGNOSIS — K5900 Constipation, unspecified: Secondary | ICD-10-CM | POA: Diagnosis present

## 2019-04-12 DIAGNOSIS — Z79899 Other long term (current) drug therapy: Secondary | ICD-10-CM

## 2019-04-12 DIAGNOSIS — Z5900 Homelessness unspecified: Secondary | ICD-10-CM

## 2019-04-12 DIAGNOSIS — N12 Tubulo-interstitial nephritis, not specified as acute or chronic: Secondary | ICD-10-CM | POA: Diagnosis present

## 2019-04-12 DIAGNOSIS — Z881 Allergy status to other antibiotic agents status: Secondary | ICD-10-CM

## 2019-04-12 DIAGNOSIS — D61818 Other pancytopenia: Secondary | ICD-10-CM | POA: Diagnosis present

## 2019-04-12 DIAGNOSIS — R109 Unspecified abdominal pain: Secondary | ICD-10-CM | POA: Diagnosis present

## 2019-04-12 DIAGNOSIS — R519 Headache, unspecified: Secondary | ICD-10-CM | POA: Diagnosis present

## 2019-04-12 DIAGNOSIS — F17210 Nicotine dependence, cigarettes, uncomplicated: Secondary | ICD-10-CM | POA: Diagnosis present

## 2019-04-12 DIAGNOSIS — R51 Headache: Secondary | ICD-10-CM | POA: Diagnosis not present

## 2019-04-12 NOTE — ED Triage Notes (Signed)
Patient reporting left kidney pain and constipation.  He reports eating a lot of food and taking exlax, plus two suppositories.  He reports drinking water and cranberry juice for kidneys.

## 2019-04-13 ENCOUNTER — Other Ambulatory Visit: Payer: Self-pay

## 2019-04-13 ENCOUNTER — Emergency Department (HOSPITAL_COMMUNITY): Payer: Medicaid Other

## 2019-04-13 DIAGNOSIS — F17210 Nicotine dependence, cigarettes, uncomplicated: Secondary | ICD-10-CM | POA: Diagnosis present

## 2019-04-13 DIAGNOSIS — R000 Tachycardia, unspecified: Secondary | ICD-10-CM | POA: Diagnosis present

## 2019-04-13 DIAGNOSIS — N390 Urinary tract infection, site not specified: Secondary | ICD-10-CM | POA: Diagnosis not present

## 2019-04-13 DIAGNOSIS — D72819 Decreased white blood cell count, unspecified: Secondary | ICD-10-CM | POA: Diagnosis present

## 2019-04-13 DIAGNOSIS — Z20828 Contact with and (suspected) exposure to other viral communicable diseases: Secondary | ICD-10-CM | POA: Diagnosis present

## 2019-04-13 DIAGNOSIS — B20 Human immunodeficiency virus [HIV] disease: Secondary | ICD-10-CM | POA: Diagnosis present

## 2019-04-13 DIAGNOSIS — R51 Headache: Secondary | ICD-10-CM | POA: Diagnosis not present

## 2019-04-13 DIAGNOSIS — Z79899 Other long term (current) drug therapy: Secondary | ICD-10-CM

## 2019-04-13 DIAGNOSIS — N10 Acute tubulo-interstitial nephritis: Principal | ICD-10-CM

## 2019-04-13 DIAGNOSIS — Z9114 Patient's other noncompliance with medication regimen: Secondary | ICD-10-CM | POA: Diagnosis not present

## 2019-04-13 DIAGNOSIS — Z720 Tobacco use: Secondary | ICD-10-CM

## 2019-04-13 DIAGNOSIS — N12 Tubulo-interstitial nephritis, not specified as acute or chronic: Secondary | ICD-10-CM | POA: Diagnosis present

## 2019-04-13 DIAGNOSIS — Z881 Allergy status to other antibiotic agents status: Secondary | ICD-10-CM | POA: Diagnosis not present

## 2019-04-13 DIAGNOSIS — Z888 Allergy status to other drugs, medicaments and biological substances status: Secondary | ICD-10-CM | POA: Diagnosis not present

## 2019-04-13 DIAGNOSIS — R109 Unspecified abdominal pain: Secondary | ICD-10-CM | POA: Diagnosis present

## 2019-04-13 DIAGNOSIS — R7881 Bacteremia: Secondary | ICD-10-CM | POA: Diagnosis present

## 2019-04-13 DIAGNOSIS — Z590 Homelessness: Secondary | ICD-10-CM | POA: Diagnosis not present

## 2019-04-13 DIAGNOSIS — K5900 Constipation, unspecified: Secondary | ICD-10-CM | POA: Diagnosis present

## 2019-04-13 DIAGNOSIS — B9620 Unspecified Escherichia coli [E. coli] as the cause of diseases classified elsewhere: Secondary | ICD-10-CM | POA: Diagnosis present

## 2019-04-13 LAB — BLOOD CULTURE ID PANEL (REFLEXED)

## 2019-04-13 LAB — COMPREHENSIVE METABOLIC PANEL
ALT: 14 U/L (ref 0–44)
AST: 19 U/L (ref 15–41)
Albumin: 2.2 g/dL — ABNORMAL LOW (ref 3.5–5.0)
Alkaline Phosphatase: 58 U/L (ref 38–126)
Anion gap: 7 (ref 5–15)
BUN: 8 mg/dL (ref 6–20)
CO2: 24 mmol/L (ref 22–32)
Calcium: 8.1 mg/dL — ABNORMAL LOW (ref 8.9–10.3)
Chloride: 99 mmol/L (ref 98–111)
Creatinine, Ser: 0.89 mg/dL (ref 0.61–1.24)
GFR calc Af Amer: >60 mL/min (ref >60)
GFR calc non Af Amer: >60 mL/min (ref >60)
Glucose, Bld: 99 mg/dL (ref 70–99)
Potassium: 3.5 mmol/L (ref 3.5–5.1)
Sodium: 130 mmol/L — ABNORMAL LOW (ref 135–145)
Total Bilirubin: 0.4 mg/dL (ref 0.3–1.2)
Total Protein: 8.7 g/dL — ABNORMAL HIGH (ref 6.5–8.1)

## 2019-04-13 LAB — CBC WITH DIFFERENTIAL/PLATELET
Abs Immature Granulocytes: 0.37 10*3/uL — ABNORMAL HIGH (ref 0.00–0.07)
Basophils Absolute: 0 10*3/uL (ref 0.0–0.1)
Basophils Relative: 0 %
Eosinophils Absolute: 0 10*3/uL (ref 0.0–0.5)
Eosinophils Relative: 0 %
HCT: 30.5 % — ABNORMAL LOW (ref 39.0–52.0)
Hemoglobin: 10.1 g/dL — ABNORMAL LOW (ref 13.0–17.0)
Immature Granulocytes: 7 %
Lymphocytes Relative: 14 %
Lymphs Abs: 0.8 10*3/uL (ref 0.7–4.0)
MCH: 31.4 pg (ref 26.0–34.0)
MCHC: 33.1 g/dL (ref 30.0–36.0)
MCV: 94.7 fL (ref 80.0–100.0)
Monocytes Absolute: 0.7 10*3/uL (ref 0.1–1.0)
Monocytes Relative: 12 %
Neutro Abs: 3.8 10*3/uL (ref 1.7–7.7)
Neutrophils Relative %: 67 %
Platelets: 221 10*3/uL (ref 150–400)
RBC: 3.22 MIL/uL — ABNORMAL LOW (ref 4.22–5.81)
RDW: 16.9 % — ABNORMAL HIGH (ref 11.5–15.5)
WBC Morphology: INCREASED
WBC: 5.6 10*3/uL (ref 4.0–10.5)
nRBC: 0 % (ref 0.0–0.2)

## 2019-04-13 LAB — URINALYSIS, ROUTINE W REFLEX MICROSCOPIC
Bilirubin Urine: NEGATIVE
Glucose, UA: NEGATIVE mg/dL
Hgb urine dipstick: NEGATIVE
Ketones, ur: NEGATIVE mg/dL
Nitrite: POSITIVE — AB
Protein, ur: 30 mg/dL — AB
Specific Gravity, Urine: 1.008 (ref 1.005–1.030)
WBC, UA: >50 WBC/hpf — ABNORMAL HIGH (ref 0–5)
pH: 6 (ref 5.0–8.0)

## 2019-04-13 LAB — LACTIC ACID, PLASMA: Lactic Acid, Venous: 1.3 mmol/L (ref 0.5–1.9)

## 2019-04-13 LAB — SARS CORONAVIRUS 2 (TAT 6-24 HRS): SARS Coronavirus 2: NEGATIVE

## 2019-04-13 MED ORDER — SULFAMETHOXAZOLE-TRIMETHOPRIM 800-160 MG PO TABS
1.0000 | ORAL_TABLET | Freq: Every day | ORAL | Status: DC
  Administered 2019-04-13 – 2019-04-16 (×4): 1 via ORAL
  Filled 2019-04-13 (×4): qty 1

## 2019-04-13 MED ORDER — MORPHINE SULFATE (PF) 4 MG/ML IV SOLN
4.0000 mg | Freq: Once | INTRAVENOUS | Status: AC
  Administered 2019-04-13: 08:00:00 4 mg via INTRAVENOUS
  Filled 2019-04-13: qty 1

## 2019-04-13 MED ORDER — ONDANSETRON HCL 4 MG/2ML IJ SOLN
4.0000 mg | Freq: Once | INTRAMUSCULAR | Status: AC | PRN
  Administered 2019-04-13: 4 mg via INTRAVENOUS
  Filled 2019-04-13: qty 2

## 2019-04-13 MED ORDER — ACETAMINOPHEN 650 MG RE SUPP: 650.0000 mg | Freq: Four times a day (QID) | RECTAL | Status: DC | PRN

## 2019-04-13 MED ORDER — LACTATED RINGERS IV BOLUS (SEPSIS)
1000.0000 mL | Freq: Once | INTRAVENOUS | Status: AC
  Administered 2019-04-13: 07:00:00 1000 mL via INTRAVENOUS

## 2019-04-13 MED ORDER — OXYCODONE HCL 5 MG PO TABS
5.0000 mg | ORAL_TABLET | ORAL | Status: DC | PRN
  Filled 2019-04-13: qty 1

## 2019-04-13 MED ORDER — ENOXAPARIN SODIUM 40 MG/0.4ML ~~LOC~~ SOLN
40.0000 mg | SUBCUTANEOUS | Status: DC
  Administered 2019-04-13 – 2019-04-15 (×3): 40 mg via SUBCUTANEOUS
  Filled 2019-04-13 (×3): qty 0.4

## 2019-04-13 MED ORDER — DARUN-COBIC-EMTRICIT-TENOFAF 800-150-200-10 MG PO TABS
1.0000 | ORAL_TABLET | Freq: Every day | ORAL | Status: DC
  Administered 2019-04-13 – 2019-04-16 (×4): 1 via ORAL
  Filled 2019-04-13 (×5): qty 1

## 2019-04-13 MED ORDER — ACETAMINOPHEN 325 MG PO TABS
650.0000 mg | ORAL_TABLET | Freq: Four times a day (QID) | ORAL | Status: DC | PRN
  Administered 2019-04-14: 650 mg via ORAL
  Filled 2019-04-13: qty 2

## 2019-04-13 MED ORDER — DARUN-COBIC-EMTRICIT-TENOFAF 800-150-200-10 MG PO TABS
1.0000 | ORAL_TABLET | Freq: Every day | ORAL | Status: DC
  Filled 2019-04-13: qty 1

## 2019-04-13 MED ORDER — LACTATED RINGERS IV BOLUS (SEPSIS)
1000.0000 mL | Freq: Once | INTRAVENOUS | Status: AC
  Administered 2019-04-13: 1000 mL via INTRAVENOUS

## 2019-04-13 MED ORDER — LEVOFLOXACIN IN D5W 750 MG/150ML IV SOLN: 750.0000 mg | Freq: Once | INTRAVENOUS | Status: DC

## 2019-04-13 MED ORDER — SODIUM CHLORIDE 0.9 % IV BOLUS
1000.0000 mL | Freq: Once | INTRAVENOUS | Status: AC
  Administered 2019-04-13: 1000 mL via INTRAVENOUS

## 2019-04-13 MED ORDER — SODIUM CHLORIDE 0.9 % IV SOLN
1.0000 g | Freq: Once | INTRAVENOUS | Status: AC
  Administered 2019-04-13: 1 g via INTRAVENOUS
  Filled 2019-04-13: qty 10

## 2019-04-13 MED ORDER — SODIUM CHLORIDE 0.9 % IV SOLN
2.0000 g | Freq: Every day | INTRAVENOUS | Status: DC
  Administered 2019-04-13 – 2019-04-15 (×3): 2 g via INTRAVENOUS
  Filled 2019-04-13: qty 2
  Filled 2019-04-13: qty 20
  Filled 2019-04-13 (×2): qty 2

## 2019-04-13 MED ORDER — SENNA 8.6 MG PO TABS
1.0000 | ORAL_TABLET | Freq: Two times a day (BID) | ORAL | Status: DC
  Administered 2019-04-13 – 2019-04-16 (×7): 8.6 mg via ORAL
  Filled 2019-04-13 (×7): qty 1

## 2019-04-13 MED ORDER — ACETAMINOPHEN 500 MG PO TABS
1000.0000 mg | ORAL_TABLET | Freq: Once | ORAL | Status: AC
  Administered 2019-04-13: 1000 mg via ORAL
  Filled 2019-04-13: qty 2

## 2019-04-13 MED ORDER — SODIUM CHLORIDE 0.9 % IV SOLN
INTRAVENOUS | Status: AC
  Administered 2019-04-13 (×2): via INTRAVENOUS

## 2019-04-13 NOTE — ED Provider Notes (Signed)
[facility] EMERGENCY DEPARTMENT Provider Note   CSN: [id number] Arrival date & time: 04/12/19  1639     History   Chief Complaint Chief Complaint  Patient presents with   Constipation   Flank Pain    HPI TEDD COTTRILL is a 33 y.o. male.     The history is provided by the patient and medical records. No language interpreter was used.  Constipation Associated symptoms: abdominal pain and back pain   Associated symptoms: no diarrhea, no nausea and no vomiting   Flank Pain Associated symptoms include abdominal pain.   MYREON WIMER is a 33 y.o. male  with a PMH of HIV / AIDS with last CD4 count of 61 last month who presents to the Emergency Department complaining of left-sided abdominal pain and back pain which began about 6 or 7 days ago.  He reports trying to drink extra water and cranberry juice for concerned that it might be his kidney bothering him.  This did not help.  He denies any nausea or vomiting, but does state that he has been constipated and has not had a bowel movement in several days which is unusual for him.  He denies any decreased p.o. intake to cause this. He did try the suppositories without improvement.  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 [facility])    H/O coccidioidomycosis    pulmonary    HIV disease ([place])    Past history of allergy to penicillin-type antibiotic 07-03-2012   desensitization   PNA (pneumonia)    Shigella gastroenteritis    Syphilis    history /treated     Patient Active Problem List   Diagnosis Date Noted   Pyelonephritis 02/13/2019   Prostatitis 10/05/2018   Leukopenia 09/20/2018   Thrombocytopenia ([place]) 09/20/2018   Hyponatremia 09/20/2018   Avoidance coping 09/07/2018   Headache 04/27/2018   Condyloma  04/24/2018   Protein-calorie malnutrition, severe 12/12/2017   Personal history of MRSA (methicillin resistant Staphylococcus aureus) 12/09/2017   History of ESBL E. 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 Authorizing Provider  Darunavir-Cobicisctat-Emtricitabine-Tenofovir Alafenamide (SYMTUZA) 800-150-200-10 MG TABS Take 1 tablet by mouth daily with breakfast. 02/15/19  Yes [name], Okey Regal, MD  sulfamethoxazole-trimethoprim (BACTRIM DS) 800-160 MG tablet Take 1 tablet by mouth daily. 02/15/19  Yes [name], Okey Regal, MD  cefdinir (OMNICEF) 300 MG capsule Take 1 capsule (300 mg total) by mouth 2 (two) times daily. Patient not taking: Reported on 04/13/2019 02/15/19   Thayer Headings, MD    Family History Family History  Problem Relation Age of Onset   Hypertension Mother    Lupus Maternal Grandmother    Cancer Paternal Grandfather     Social History Social History   Tobacco Use  Smoking status: Current Every Day Smoker    Packs/day: 0.30    Types: Cigarettes   Smokeless tobacco: Never Used   Tobacco comment: cutting back  Substance Use Topics   Alcohol use: Yes    Alcohol/week: 1.0 standard drinks    Types: 1 Standard drinks or equivalent per week    Comment: whiskey occasionally    Drug use: Yes    Frequency: 1.0 times per week    Types: Marijuana     Allergies   Amoxicillin and Vancomycin   Review of  Systems Review of Systems  Gastrointestinal: Positive for abdominal pain and constipation. Negative for diarrhea, nausea and vomiting.  Genitourinary: Positive for flank pain. Negative for discharge, penile pain, penile swelling, scrotal swelling and testicular pain.  Musculoskeletal: Positive for back pain.  All other systems reviewed and are negative.    Physical Exam Updated Vital Signs BP 103/63    Pulse 93    Temp 100 F (37.8 C) (Oral)    Resp (!) 28    SpO2 100%   Physical Exam Vitals signs and nursing note reviewed.  Constitutional:      General: He is not in acute distress.    Appearance: He is well-developed.  HENT:     Head: Normocephalic and atraumatic.  Neck:     Musculoskeletal: Neck supple.  Cardiovascular:     Heart sounds: Normal heart sounds. No murmur.     Comments: Tachycardic, but regular. Pulmonary:     Effort: Pulmonary effort is normal. No respiratory distress.     Breath sounds: Normal breath sounds.  Abdominal:     General: There is no distension.     Palpations: Abdomen is soft.     Comments: Left sided abdominal, flank and CVA tenderness.   Skin:    General: Skin is warm and dry.  Neurological:     Mental Status: He is alert and oriented to person, place, and time.      ED Treatments / Results  Labs (all labs ordered are listed, but only abnormal results are displayed) Labs Reviewed  CBC WITH DIFFERENTIAL/PLATELET - Abnormal; Notable for the following components:      Result Value   RBC 3.22 (*)    Hemoglobin 10.1 (*)    HCT 30.5 (*)    RDW 16.9 (*)    Abs Immature Granulocytes 0.37 (*)    All other components within normal limits  COMPREHENSIVE METABOLIC PANEL - Abnormal; Notable for the following components:   Sodium 130 (*)    Calcium 8.1 (*)    Total Protein 8.7 (*)    Albumin 2.2 (*)    All other components within normal limits  URINALYSIS, ROUTINE W REFLEX MICROSCOPIC - Abnormal; Notable for the following components:    APPearance CLOUDY (*)    Protein, ur 30 (*)    Nitrite POSITIVE (*)    Leukocytes,Ua LARGE (*)    WBC, UA >50 (*)    Bacteria, UA RARE (*)    All other components within normal limits  CULTURE, BLOOD (ROUTINE X 2)  CULTURE, BLOOD (ROUTINE X 2)  URINE CULTURE  SARS CORONAVIRUS 2 (TAT 6-24 HRS)  LACTIC ACID, PLASMA  LACTIC ACID, PLASMA    EKG None  Radiology Ct Renal Stone Study  Result Date: 04/13/2019 CLINICAL DATA:  Abdominal pain, constipation, LEFT flank pain. Possible pyelonephritis. EXAM: CT ABDOMEN AND PELVIS WITHOUT CONTRAST TECHNIQUE: Multidetector CT imaging of the abdomen and pelvis was performed following the standard protocol without  IV contrast. COMPARISON:  CT abdomen dated 02/13/2019 FINDINGS: Lower chest: No acute abnormality. Hepatobiliary: No focal liver abnormality is seen. No gallstones, gallbladder wall thickening, or biliary dilatation. Pancreas: Unremarkable. No pancreatic ductal dilatation or surrounding inflammatory changes. Spleen: Normal in size without focal abnormality. Adrenals/Urinary Tract: Adrenal glands are unremarkable. Kidneys are unremarkable without mass, stone or hydronephrosis. No obvious perinephric fluid. No ureteral or bladder calculi identified. Bladder is partially decompressed limiting characterization. Stomach/Bowel: No dilated large or small bowel loops. Moderate amount of stool and gas throughout the nondistended colon. Lack of oral contrast limits characterization of the bowel walls but there is no obvious evidence of active bowel wall inflammation. Stomach is unremarkable, partially decompressed. Appendix is not seen. Vascular/Lymphatic: No significant vascular findings are present. No enlarged abdominal or pelvic lymph nodes. Reproductive: Prostate is unremarkable. Other: No free fluid or abscess collection. No free intraperitoneal air. Musculoskeletal: No acute or suspicious osseous finding. Mild degenerative spondylosis at the lumbosacral  level. IMPRESSION: No acute findings within the abdomen or pelvis. No source for left flank pain identified. No renal or ureteral calculi. No obvious evidence of pyelonephritis, however, characterization of the kidneys is limited by the lack of IV contrast. No bowel obstruction or evidence of active bowel wall inflammation. Electronically Signed   By: Bary RichardStan  Maynard M.D.   On: 04/13/2019 08:44    Procedures Procedures (including critical care time)  Medications Ordered in ED Medications  lactated ringers bolus 1,000 mL (0 mLs Intravenous Stopped 04/13/19 0829)    And  lactated ringers bolus 1,000 mL (1,000 mLs Intravenous New Bag/Given 04/13/19 0829)    And  lactated ringers bolus 1,000 mL (0 mLs Intravenous Stopped 04/13/19 0857)  cefTRIAXone (ROCEPHIN) 1 g in sodium chloride 0.9 % 100 mL IVPB (0 g Intravenous Stopped 04/13/19 0748)  morphine 4 MG/ML injection 4 mg (4 mg Intravenous Given 04/13/19 0820)  ondansetron (ZOFRAN) injection 4 mg (4 mg Intravenous Given 04/13/19 0820)  acetaminophen (TYLENOL) tablet 1,000 mg (1,000 mg Oral Given 04/13/19 0904)     Initial Impression / Assessment and Plan / ED Course  I have reviewed the triage vital signs and the nursing notes.  Pertinent labs & imaging results that were available during my care of the patient were reviewed by me and considered in my medical decision making (see chart for details).       Lillie ColumbiaDesmond J Corniel is a 33 y.o. male with history of HIV who presents to the emergency department for left-sided back, flank and abdominal pain as well as constipation. On exam, patient febrile, tachycardic and tachypneic with tenderness to the left side of his abdomen and flank as well as left CVA tenderness.  White count and lactic were normal fortunately.  Blood and urine cultures were obtained. Urinalysis showed large leuks and greater than 50 WBCs and was nitrate positive as well, therefore started on Rocephin. Patient started on weight-based fluid  resuscitation and antibiotics. CT renal study was obtained to ensure no other abnormalities causing his abdominal pain. CT showed no acute findings. IMTS consulted who will admit.  Patient discussed with Dr. Dalene SeltzerSchlossman who agrees with treatment plan.    Final Clinical Impressions(s) / ED Diagnoses   Final diagnoses:  Pyelonephritis    ED Discharge Orders    None       Raniah Karan, Chase PicketJaime Pilcher, PA-C 04/13/19 [id number]    Alvira MondaySchlossman, Erin, MD 04/14/19 [phone]

## 2019-04-13 NOTE — Progress Notes (Signed)
PHARMACY - PHYSICIAN COMMUNICATION CRITICAL VALUE ALERT - BLOOD CULTURE IDENTIFICATION (BCID)  Jacob Gutierrez is an 33 y.o. male who presented to [facility] on 04/12/2019 with a chief complaint of back pain  Assessment:  Pt growing Ecoli in blood cultures (include suspected source if known)  Name of physician (or Provider) Contacted: Dr. Marva Panda  Current antibiotics: None  Changes to prescribed antibiotics recommended:  Rocephin 2gm IV q24h  Results for orders placed or performed during the hospital encounter of 04/12/19  Blood Culture ID Panel (Reflexed) (Collected: 04/13/2019  6:45 AM)  Result Value Ref Range   Enterococcus species NOT DETECTED NOT DETECTED   Listeria monocytogenes NOT DETECTED NOT DETECTED   Staphylococcus species NOT DETECTED NOT DETECTED   Staphylococcus aureus (BCID) NOT DETECTED NOT DETECTED   Streptococcus species NOT DETECTED NOT DETECTED   Streptococcus agalactiae NOT DETECTED NOT DETECTED   Streptococcus pneumoniae NOT DETECTED NOT DETECTED   Streptococcus pyogenes NOT DETECTED NOT DETECTED   Acinetobacter baumannii NOT DETECTED NOT DETECTED   Enterobacteriaceae species DETECTED (A) NOT DETECTED   Enterobacter cloacae complex NOT DETECTED NOT DETECTED   Escherichia coli DETECTED (A) NOT DETECTED   Klebsiella oxytoca NOT DETECTED NOT DETECTED   Klebsiella pneumoniae NOT DETECTED NOT DETECTED   Proteus species NOT DETECTED NOT DETECTED   Serratia marcescens NOT DETECTED NOT DETECTED   Carbapenem resistance NOT DETECTED NOT DETECTED   Haemophilus influenzae NOT DETECTED NOT DETECTED   Neisseria meningitidis NOT DETECTED NOT DETECTED   Pseudomonas aeruginosa NOT DETECTED NOT DETECTED   Candida albicans NOT DETECTED NOT DETECTED   Candida glabrata NOT DETECTED NOT DETECTED   Candida krusei NOT DETECTED NOT DETECTED   Candida parapsilosis NOT DETECTED NOT DETECTED   Candida tropicalis NOT DETECTED NOT DETECTED    Sherlon Handing, PharmD, BCPS 04/13/2019   10:24 PM

## 2019-04-13 NOTE — Plan of Care (Signed)
  Problem: Education: Goal: Knowledge of General Education information will improve Description Including pain rating scale, medication(s)/side effects and non-pharmacologic comfort measures Outcome: Progressing   
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
## 2019-04-13 NOTE — ED Notes (Signed)
Dr. Sherry Ruffing paged to [place], RN paged by Levada Dy

## 2019-04-13 NOTE — Progress Notes (Signed)
New Admission Note: Patient admitted from the [facility]  Arrival Method: via stretcher Mental Orientation: alert and oriented x 4 Telemetry: initiated Assessment: Completed Skin: intact IV: R AC Pain: denies Tubes: n/a Safety Measures: Safety Fall Prevention Plan has been given, discussed and signed Admission: Completed [facility] Orientation: Patient has been orientated to the room, unit and staff.  Family: none at the bedside  Orders have been reviewed and implemented. Will continue to monitor the patient. Call light has been placed within reach and bed alarm has been activated.   Dorthey Sawyer, RN

## 2019-04-14 LAB — BASIC METABOLIC PANEL
Anion gap: 10 (ref 5–15)
BUN: 6 mg/dL (ref 6–20)
CO2: 19 mmol/L — ABNORMAL LOW (ref 22–32)
Calcium: 7.6 mg/dL — ABNORMAL LOW (ref 8.9–10.3)
Chloride: 101 mmol/L (ref 98–111)
Creatinine, Ser: 0.87 mg/dL (ref 0.61–1.24)
GFR calc Af Amer: >60 mL/min (ref >60)
GFR calc non Af Amer: >60 mL/min (ref >60)
Glucose, Bld: 104 mg/dL — ABNORMAL HIGH (ref 70–99)
Potassium: 3.5 mmol/L (ref 3.5–5.1)
Sodium: 130 mmol/L — ABNORMAL LOW (ref 135–145)

## 2019-04-14 LAB — CBC
HCT: 25.3 % — ABNORMAL LOW (ref 39.0–52.0)
Hemoglobin: 8.5 g/dL — ABNORMAL LOW (ref 13.0–17.0)
MCH: 31.7 pg (ref 26.0–34.0)
MCHC: 33.6 g/dL (ref 30.0–36.0)
MCV: 94.4 fL (ref 80.0–100.0)
Platelets: 189 10*3/uL (ref 150–400)
RBC: 2.68 MIL/uL — ABNORMAL LOW (ref 4.22–5.81)
RDW: 17 % — ABNORMAL HIGH (ref 11.5–15.5)
WBC: 4 10*3/uL (ref 4.0–10.5)
nRBC: 0 % (ref 0.0–0.2)

## 2019-04-14 LAB — MRSA PCR SCREENING: MRSA by PCR: POSITIVE — AB

## 2019-04-14 MED ORDER — CHLORHEXIDINE GLUCONATE CLOTH 2 % EX PADS
6.0000 | MEDICATED_PAD | Freq: Every day | CUTANEOUS | Status: DC
  Administered 2019-04-15: 6 via TOPICAL

## 2019-04-14 MED ORDER — MUPIROCIN 2 % EX OINT
1.0000 "application " | TOPICAL_OINTMENT | Freq: Two times a day (BID) | CUTANEOUS | Status: DC
  Administered 2019-04-14 – 2019-04-16 (×4): 1 via NASAL
  Filled 2019-04-14 (×2): qty 22

## 2019-04-14 NOTE — Progress Notes (Signed)
  Date: 04/14/2019  Patient name: Jacob Gutierrez  Medical record number: [id number]  Date of birth: 14-Aug-1985   I have seen and evaluated Jacob Gutierrez and discussed their care with the Residency Team.  In brief, patient is a 33 year old male with a past medical history of HIV/AIDS, syphilis who presented to the ED with worsening back pain over the last 2 to 3 days.  Patient states that he has had severe pain over the left side of his back ("in his kidneys") over the last 2 to 3 days.  Patient states that the pain has progressively worsened and he came in to have it evaluated.  Pain is sharp, nonradiating. Touching his back and laying on his side aggravate his pain.  No abdominal pain, no nausea or vomiting, no headache, no focal weakness, no blurry vision, no chest pain, no shortness of breath, no palpitations.  Patient did have some low-grade fevers.  Today patient states that he feels much better and that his back pain is improving.  He would like to eat something today.  PMHx, Fam Hx, and/or Soc Hx : As per resident note  Vitals:   04/14/19 0610 04/14/19 1020  BP: (!) 97/50 (!) 103/59  Pulse: 93 85  Resp: 18 19  Temp: 99 F (37.2 C) 98.8 F (37.1 C)  SpO2: 98% 98%   General: Awake, alert, oriented x3, NAD CVS: Regular rate and rhythm, normal heart sounds Lungs: CTA bilaterally Abdomen: Soft, nontender, nondistended, normoactive bowel sounds MSK: No edema noted, no tenderness to palpation over his extremities but does have mild left CVA tenderness HEENT: Normocephalic, atraumatic Psych: Normal mood and affect Skin: Warm and dry  Assessment and Plan: I have seen and evaluated the patient as outlined above. I agree with the formulated Assessment and Plan as detailed in the residents' note, with the following changes:   1.  Acute left-sided pyelonephritis with E. coli bacteremia: -Patient presented to the ED with severe left-sided back pain with CVA tenderness on exam and a  positive UA consistent with UTI and concerning for possible left-sided pyelonephritis -We will continue ceftriaxone 2 g every 24 hours -We will follow-up sensitivities of blood culture -Urine culture growing gram-negative rods.  We will follow-up speciation and sensitivities -Patient symptoms are improving.  We will continue to monitor for now -Continue pain control as needed -Would consider DC IVF today as patient seems to be able to tolerate oral and has no nausea or vomiting and only low-grade fevers -We will continue with his anti-retroviral medication for HIV.  Will confirm medication regimen with ID on Monday -No further work-up at this time  Aldine Contes, MD 9/20/202011:13 AM

## 2019-04-14 NOTE — Progress Notes (Signed)
   Subjective:  Mr. Mochizuki was seen at bedside this morning. He reports that he is feeling better today and has had multiple bowel movements. Initially he was having dark BM however they have started to appear normally. He did enquire about starting a diet, and a diet was ordered. We discussed his lab findings and the treatment plan for his  He reports that he missed his ID appointment.   Objective:  Vital signs in last 24 hours: Vitals:   04/13/19 1830 04/13/19 2036 04/14/19 0610 04/14/19 1020  BP: (!) 106/56 107/65 (!) 97/50 (!) 103/59  Pulse: 92 84 93 85  Resp: 18 16 18 19   Temp: 100.2 F (37.9 C) 100 F (37.8 C) 99 F (37.2 C) 98.8 F (37.1 C)  TempSrc: Oral Oral Oral Oral  SpO2: 99% 100% 98% 98%   Physical Exam Constitutional:      General: He is not in acute distress.    Appearance: Normal appearance. He is not ill-appearing or toxic-appearing.  HENT:     Head: Normocephalic and atraumatic.  Cardiovascular:     Rate and Rhythm: Normal rate and regular rhythm.     Pulses: Normal pulses.     Heart sounds: Normal heart sounds. No murmur. No friction rub. No gallop.   Pulmonary:     Effort: Pulmonary effort is normal. No respiratory distress.     Breath sounds: Normal breath sounds. No wheezing.  Abdominal:     General: Abdomen is flat. Bowel sounds are normal.     Palpations: Abdomen is soft.     Comments: Mild CVA tenderness noted in L flank.    Neurological:     Mental Status: He is alert and oriented to person, place, and time.  Psychiatric:        Mood and Affect: Mood normal.        Behavior: Behavior normal.     Assessment/Plan:  Active Problems:   Pyelonephritis  L sided Pyelonephritis with E. Coli Bacteremia:  - Blood cultures grew E. Coli with the probable urinary source which grew > 100k gram negative rods.  - Increased Rocephin to 2g IV QD - Continue Oxycodone 5 mg IR tab Q4H PRN - Diet advanced to regular   HIV/AIDS:  - Will speak with his  infectious disease provider 04/15/2019 to clarify medications for his HIV/AIDS management. Patient states he is currently taking truvada - Continue Symtuza - Continue bactrim  - RNA quant: Pending - Ordered CD4 count   Constipation: (Resolved)  Dispo: Anticipated discharge pending medical course.   Maudie Mercury, MD 04/14/2019, 12:20 PM Pager: [phone]

## 2019-04-15 ENCOUNTER — Inpatient Hospital Stay (HOSPITAL_COMMUNITY): Payer: Medicaid Other

## 2019-04-15 DIAGNOSIS — B9620 Unspecified Escherichia coli [E. coli] as the cause of diseases classified elsewhere: Secondary | ICD-10-CM

## 2019-04-15 DIAGNOSIS — B20 Human immunodeficiency virus [HIV] disease: Secondary | ICD-10-CM

## 2019-04-15 DIAGNOSIS — K6282 Dysplasia of anus: Secondary | ICD-10-CM

## 2019-04-15 DIAGNOSIS — F17210 Nicotine dependence, cigarettes, uncomplicated: Secondary | ICD-10-CM

## 2019-04-15 DIAGNOSIS — Z9114 Patient's other noncompliance with medication regimen: Secondary | ICD-10-CM

## 2019-04-15 DIAGNOSIS — Z9079 Acquired absence of other genital organ(s): Secondary | ICD-10-CM

## 2019-04-15 DIAGNOSIS — Z87438 Personal history of other diseases of male genital organs: Secondary | ICD-10-CM

## 2019-04-15 DIAGNOSIS — Z22322 Carrier or suspected carrier of Methicillin resistant Staphylococcus aureus: Secondary | ICD-10-CM

## 2019-04-15 DIAGNOSIS — R7881 Bacteremia: Secondary | ICD-10-CM

## 2019-04-15 DIAGNOSIS — Z881 Allergy status to other antibiotic agents status: Secondary | ICD-10-CM

## 2019-04-15 DIAGNOSIS — Z1629 Resistance to other single specified antibiotic: Secondary | ICD-10-CM

## 2019-04-15 DIAGNOSIS — N390 Urinary tract infection, site not specified: Secondary | ICD-10-CM

## 2019-04-15 DIAGNOSIS — Z1611 Resistance to penicillins: Secondary | ICD-10-CM

## 2019-04-15 LAB — T-HELPER CELLS (CD4) COUNT (NOT AT ARMC)
CD4 % Helper T Cell: 7 % — ABNORMAL LOW (ref 33–65)
CD4 T Cell Abs: <35 /uL — ABNORMAL LOW (ref 400–1790)

## 2019-04-15 LAB — CBC
HCT: 25.4 % — ABNORMAL LOW (ref 39.0–52.0)
Hemoglobin: 8.9 g/dL — ABNORMAL LOW (ref 13.0–17.0)
MCH: 34.2 pg — ABNORMAL HIGH (ref 26.0–34.0)
MCHC: 35 g/dL (ref 30.0–36.0)
MCV: 97.7 fL (ref 80.0–100.0)
Platelets: 222 10*3/uL (ref 150–400)
RBC: 2.6 MIL/uL — ABNORMAL LOW (ref 4.22–5.81)
RDW: 17.1 % — ABNORMAL HIGH (ref 11.5–15.5)
WBC: 2.5 10*3/uL — ABNORMAL LOW (ref 4.0–10.5)
nRBC: 0 % (ref 0.0–0.2)

## 2019-04-15 LAB — CRYPTOCOCCAL ANTIGEN: Crypto Ag: NEGATIVE

## 2019-04-15 LAB — CULTURE, BLOOD (ROUTINE X 2)

## 2019-04-15 LAB — URINE CULTURE: Culture: >=100000 — AB

## 2019-04-15 LAB — BASIC METABOLIC PANEL
Anion gap: 7 (ref 5–15)
BUN: 7 mg/dL (ref 6–20)
CO2: 21 mmol/L — ABNORMAL LOW (ref 22–32)
Calcium: 8 mg/dL — ABNORMAL LOW (ref 8.9–10.3)
Chloride: 108 mmol/L (ref 98–111)
Creatinine, Ser: 0.73 mg/dL (ref 0.61–1.24)
GFR calc Af Amer: >60 mL/min (ref >60)
GFR calc non Af Amer: >60 mL/min (ref >60)
Glucose, Bld: 94 mg/dL (ref 70–99)
Potassium: 3.8 mmol/L (ref 3.5–5.1)
Sodium: 136 mmol/L (ref 135–145)

## 2019-04-15 MED ORDER — HYDROMORPHONE HCL 1 MG/ML IJ SOLN
INTRAMUSCULAR | Status: AC
  Filled 2019-04-15: qty 1

## 2019-04-15 NOTE — Progress Notes (Signed)
   Subjective:  Jacob Gutierrez was seen at bedside this morning. He states that he has a new headache that began yesterday. In addition to his headache, he endorsed diaphoresis last night, and the nurses had to change his sheets due to his perspiration. We spoke with him about his culture sensitivities and the continuation of his medication.  Objective:  Vital signs in last 24 hours: Vitals:   04/14/19 2015 04/14/19 2139 04/15/19 0422 04/15/19 0915  BP: (!) 89/55 (!) 99/59 106/63 (!) 94/52  Pulse: 62 (!) 57 65 76  Resp: 16  18 18   Temp: (!) 97.4 F (36.3 C)  98.2 F (36.8 C) 98.4 F (36.9 C)  TempSrc: Oral  Oral Oral  SpO2: 100% 100% 99% 99%  Weight:   74.6 kg     Assessment/Plan:  Active Problems:   Pyelonephritis  L sided Pyelonephritis with E. Coli Bacteremia:  - Spoke with ID, who will meet with Jacob Gutierrez. We appreciate their recommendations with Jacob Gutierrez medical course.  - Spoke with Cristela Felt with Infection Disease who recommends Jacob Gutierrez receive refills for his Symtuza and Bactrim. They recommend a 14 day course of antibiotics in the setting of HIV.   - Continue Rocephin to 2g IV QD. - Continue Oxycodone 5 mg IR tab Q4H PRN. - Diet regular.   HIV/AIDS:  - Continue Symtuza 800-150-200-10 mg QAM - Continue Bactrim 800-160 mg QD - RNA quant: Pending - CD4 Count: <35  Constipation: (Resolved)  Dispo: Anticipated discharge pending medical course.   Maudie Mercury, MD 04/15/2019, 12:40 PM Pager: [phone]

## 2019-04-15 NOTE — Consult Note (Signed)
[facility]    Date of Admission:  04/12/2019     Total days of antibiotics 3               Reason for Consult: AIDS   Referring Provider: Sandre Gutierrez Primary Care Provider: Grayce SessionsEdwards, Michelle P, NP   ASSESSMENT:  [name] is a 33 year old male with poorly controlled AIDS and less than optimal adherence to his ART regimen admitted with concerns for recurrent pyelonephritis complicated by E. Coli bacteremia in the setting of previous urological issues. Clinically without evidence of opportunistic infection or progressive HIV/AIDS and pyelonephritis appears to be resolving. It is unlikely that he is taking his medications as prescribed given he has not filled a prescription with his pharmacy since May and his current CD4 count is <35. Fortunately previous genotypes without resistance. Will check cryptococcal antigen via blood. Agree with restarting Symtuza and Bactrim. We discussed importance of taking his medication as prescribed and need for continued follow up. Will work to obtain medication prior to discharge and arrange for follow up in [facility] office.  PLAN:  1. Continue current dose of ceftriaxone. 2. Agree with Symtuza supplemented with Bactrim for OI prophylaxis. 3. Check cryptococcal antigen. 4. Check genotype with blood work.    Active Problems:   AIDS (acquired immune deficiency syndrome) (HCC)   Pyelonephritis    Chlorhexidine Gluconate Cloth  6 each Topical Q0600   Darunavir-Cobicisctat-Emtricitabine-Tenofovir Alafenamide  1 tablet Oral Q breakfast   enoxaparin (LOVENOX) injection  40 mg Subcutaneous Q24H   mupirocin ointment  1 application Nasal BID   senna  1 tablet Oral BID   sulfamethoxazole-trimethoprim  1 tablet Oral Daily     HPI: Jacob Gutierrez is a 33 y.o. male with previous medical history of HIV/AIDS, prostatitis s/Gutierrez TURP in 2019, anal dyplasia and recent hospitalization in July 2020 for pyelonephritis admitted with  worsening flank pain over the course of the past week refractory to cranberry juice intake and increasing water intake.   Elevated temperature of 100 in the ED with tachycardia. Urinalysis positive for nitrite, leukocytes, and WBC >50. Renal CT scan no acute findings in the abdomen or pelvis with no renal calculi or pyelonephritis.  Urine culture positive for E. Coli with >1000,000 colonies and resistance to Bactrim and Ampicillin. MRSA PCR was positive.   [name] was last seen in the [facility] office on 10/24/18 fir a 3 week follow up for presumed prostatitis. He had good compliance with his Symtuza and Bactrim with viral load found to be 1,850 and CD4 count of 170. [name] reports that he has been taking his Symtuza and Bactrim as prescribed with improved adherence over the last 30 days but has missed several doses in the interim. [facility] and has no problems obtaining his medication from the pharmacy. He denies headaches, fevers, chills, blurred/changed vision, or rashes/lesions. Not currently sexually active.   [name] has been afebrile since arrival with blood cultures positive for E. Coli. Currently on day 3 of ceftriaxone. He has also been restarted on Symtuza and Bactrim.   Review of Systems: Review of Systems  Constitutional: Negative for chills, fever and weight loss.  Respiratory: Negative for cough, shortness of breath and wheezing.   Cardiovascular: Negative for chest pain and leg swelling.  Gastrointestinal: Negative for abdominal pain, constipation, diarrhea, nausea and vomiting.  Genitourinary: Positive for flank pain. Negative for frequency and hematuria.  Skin: Negative for rash.  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 [facility])    H/O coccidioidomycosis    pulmonary      HIV disease ([place])    Past history of allergy to penicillin-type antibiotic 07-03-2012   desensitization   PNA (pneumonia)    Shigella gastroenteritis    Syphilis    history /treated     Social History   Tobacco Use   Smoking status: Current Every Day Smoker    Packs/day: 0.30    Types: Cigarettes   Smokeless tobacco: Never Used   Tobacco comment: cutting back  Substance Use Topics   Alcohol use: Yes    Alcohol/week: 1.0 standard drinks    Types: 1 Standard drinks or equivalent per week    Comment: whiskey occasionally    Drug use: Yes    Frequency: 1.0 times per week    Types: Marijuana    Family History  Problem Relation Age of Onset   Hypertension Mother    Lupus Maternal Grandmother    Cancer Paternal Grandfather     Allergies  Allergen Reactions   Amoxicillin Other (See Comments)    From childhood: "I had a reaction when i was little." (??)   Vancomycin Itching, Swelling and Other (See Comments)    Angioedema, also    OBJECTIVE: Blood pressure (!) 94/52, pulse 76, temperature 98.4 F (36.9 C), temperature source Oral, resp. rate 18, weight 74.6 kg, SpO2 99 %.  Physical Exam Constitutional:      General: He is not in acute distress.    Appearance: He is well-developed.  Eyes:     Conjunctiva/sclera: Conjunctivae normal.  Neck:     Musculoskeletal: Neck supple.  Cardiovascular:     Rate and Rhythm: Normal rate and regular rhythm.     Heart sounds: Normal heart sounds. No murmur. No friction rub. No gallop.   Pulmonary:     Effort: Pulmonary effort is normal. No respiratory distress.     Breath sounds: Normal breath sounds. No wheezing or rales.  Chest:     Chest wall: No tenderness.  Abdominal:     General: Bowel sounds are normal.     Palpations: Abdomen is soft.     Tenderness: There is no abdominal tenderness.  Lymphadenopathy:     Cervical: No cervical adenopathy.  Skin:    General: Skin is warm and dry.     Findings: No  rash.  Neurological:     Mental Status: He is alert.     Lab Results Lab Results  Component Value Date   WBC 2.5 (L) 04/15/2019   HGB 8.9 (L) 04/15/2019   HCT 25.4 (L) 04/15/2019   MCV 97.7 04/15/2019   PLT 222 04/15/2019    Lab Results  Component Value Date   CREATININE 0.73 04/15/2019   BUN 7 04/15/2019   NA 136 04/15/2019   K 3.8 04/15/2019   CL 108 04/15/2019   CO2 21 (L) 04/15/2019    Lab Results  Component Value Date   ALT 14 04/12/2019   AST 19 04/12/2019   ALKPHOS 58 04/12/2019   BILITOT 0.4 04/12/2019     Microbiology: Recent Results (from the past 240 hour(s))  Blood Culture (routine x 2)     Status: Abnormal   Collection Time: 04/13/19  6:45 AM   Specimen: BLOOD RIGHT ARM  Result  Value Ref Range Status   Specimen Description BLOOD RIGHT ARM  Final   Special Requests   Final    BOTTLES DRAWN AEROBIC AND ANAEROBIC Blood Culture results may not be optimal due to an excessive volume of blood received in culture bottles   Culture  Setup Time   Final    GRAM NEGATIVE RODS AEROBIC BOTTLE ONLY CRITICAL RESULT CALLED TO, READ BACK BY AND VERIFIED WITH: CALLED [facility] @ 2214 ON 04/13/19 BY ROBINSON Z. Performed at [facility], [address], [place], [place] [address]    Culture ESCHERICHIA COLI (A)  Final   Report Status 04/15/2019 FINAL  Final   Organism ID, Bacteria ESCHERICHIA COLI  Final      Susceptibility   Escherichia coli - MIC*    AMPICILLIN >=32 RESISTANT Resistant     CEFAZOLIN <=4 SENSITIVE Sensitive     CEFEPIME <=1 SENSITIVE Sensitive     CEFTAZIDIME <=1 SENSITIVE Sensitive     CEFTRIAXONE <=1 SENSITIVE Sensitive     CIPROFLOXACIN <=0.25 SENSITIVE Sensitive     GENTAMICIN <=1 SENSITIVE Sensitive     IMIPENEM <=0.25 SENSITIVE Sensitive     TRIMETH/SULFA >=320 RESISTANT Resistant     AMPICILLIN/SULBACTAM 4 SENSITIVE Sensitive     PIP/TAZO <=4 SENSITIVE Sensitive     Extended ESBL NEGATIVE Sensitive     * ESCHERICHIA COLI    Blood Culture ID Panel (Reflexed)     Status: Abnormal   Collection Time: 04/13/19  6:45 AM  Result Value Ref Range Status   Enterococcus species NOT DETECTED NOT DETECTED Final   Listeria monocytogenes NOT DETECTED NOT DETECTED Final   Staphylococcus species NOT DETECTED NOT DETECTED Final   Staphylococcus aureus (BCID) NOT DETECTED NOT DETECTED Final   Streptococcus species NOT DETECTED NOT DETECTED Final   Streptococcus agalactiae NOT DETECTED NOT DETECTED Final   Streptococcus pneumoniae NOT DETECTED NOT DETECTED Final   Streptococcus pyogenes NOT DETECTED NOT DETECTED Final   Acinetobacter baumannii NOT DETECTED NOT DETECTED Final   Enterobacteriaceae species DETECTED (A) NOT DETECTED Final    Comment: Enterobacteriaceae represent a large family of gram-negative bacteria, not a single organism. CRITICAL RESULT CALLED TO, READ BACK BY AND VERIFIED WITH: CALLED [facility] @ 2214 ON 04/13/19 BY ROBINSON Z.     Enterobacter cloacae complex NOT DETECTED NOT DETECTED Final   Escherichia coli DETECTED (A) NOT DETECTED Final    Comment: CRITICAL RESULT CALLED TO, READ BACK BY AND VERIFIED WITH: CALLED [facility] @ 2214 ON 04/13/19 BY ROBINSON Z.     Klebsiella oxytoca NOT DETECTED NOT DETECTED Final   Klebsiella pneumoniae NOT DETECTED NOT DETECTED Final   Proteus species NOT DETECTED NOT DETECTED Final   Serratia marcescens NOT DETECTED NOT DETECTED Final   Carbapenem resistance NOT DETECTED NOT DETECTED Final   Haemophilus influenzae NOT DETECTED NOT DETECTED Final   Neisseria meningitidis NOT DETECTED NOT DETECTED Final   Pseudomonas aeruginosa NOT DETECTED NOT DETECTED Final   Candida albicans NOT DETECTED NOT DETECTED Final   Candida glabrata NOT DETECTED NOT DETECTED Final   Candida krusei NOT DETECTED NOT DETECTED Final   Candida parapsilosis NOT DETECTED NOT DETECTED Final   Candida tropicalis NOT DETECTED NOT DETECTED Final    Comment: Performed at [facility], [address], [place], [place] [address]  Blood Culture (routine x 2)     Status: None (Preliminary result)   Collection Time: 04/13/19  7:00 AM  Specimen: BLOOD  Result Value Ref Range Status   Specimen Description BLOOD LEFT ANTECUBITAL  Final   Special Requests   Final    BOTTLES DRAWN AEROBIC AND ANAEROBIC Blood Culture results may not be optimal due to an inadequate volume of blood received in culture bottles   Culture   Final    NO GROWTH 2 DAYS Performed at [facility], [address] [place], [place] [address]    Report Status PENDING  Incomplete  Urine culture     Status: Abnormal   Collection Time: 04/13/19  7:05 AM   Specimen: In/Out Cath Urine  Result Value Ref Range Status   Specimen Description IN/OUT CATH URINE  Final   Special Requests   Final    NONE Performed at [facility], [address], [place], [place] [address]    Culture >=100,000 COLONIES/mL ESCHERICHIA COLI (A)  Final   Report Status 04/15/2019 FINAL  Final   Organism ID, Bacteria ESCHERICHIA COLI (A)  Final      Susceptibility   Escherichia coli - MIC*    AMPICILLIN >=32 RESISTANT Resistant     CEFAZOLIN <=4 SENSITIVE Sensitive     CEFTRIAXONE <=1 SENSITIVE Sensitive     CIPROFLOXACIN <=0.25 SENSITIVE Sensitive     GENTAMICIN <=1 SENSITIVE Sensitive     IMIPENEM <=0.25 SENSITIVE Sensitive     NITROFURANTOIN <=16 SENSITIVE Sensitive     TRIMETH/SULFA >=320 RESISTANT Resistant     AMPICILLIN/SULBACTAM 4 SENSITIVE Sensitive     PIP/TAZO <=4 SENSITIVE Sensitive     Extended ESBL NEGATIVE Sensitive     * >=100,000 COLONIES/mL ESCHERICHIA COLI  SARS CORONAVIRUS 2 (TAT 6-24 HRS) Nasopharyngeal Nasopharyngeal Swab     Status: None   Collection Time: 04/13/19  9:05 AM   Specimen: Nasopharyngeal Swab  Result Value Ref Range Status   SARS Coronavirus 2 NEGATIVE NEGATIVE Final    Comment: (NOTE) SARS-CoV-2 target nucleic acids are NOT DETECTED. The SARS-CoV-2 RNA is generally  detectable in upper and lower respiratory specimens during the acute phase of infection. Negative results do not preclude SARS-CoV-2 infection, do not rule out co-infections with other pathogens, and should not be used as the sole basis for treatment or other patient management decisions. Negative results must be combined with clinical observations, patient history, and epidemiological information. The expected result is Negative. Fact Sheet for Patients: HairSlick.no Fact Sheet for Healthcare Providers: quierodirigir.com This test is not yet approved or cleared by the [place] FDA and  has been authorized for detection and/or diagnosis of SARS-CoV-2 by FDA under an Emergency Use Authorization (EUA). This EUA will remain  in effect (meaning this test can be used) for the duration of the COVID-19 declaration under Section 56 4(b)(1) of the Act, 21 U.S.C. section 360bbb-3(b)(1), unless the authorization is terminated or revoked sooner. Performed at [facility], [address], [place], [place] [address]   MRSA PCR Screening     Status: Abnormal   Collection Time: 04/14/19  6:47 AM   Specimen: Nasopharyngeal  Result Value Ref Range Status   MRSA by PCR POSITIVE (A) NEGATIVE Final    Comment:        The GeneXpert MRSA Assay (FDA approved for NASAL specimens only), is one component of a comprehensive MRSA colonization surveillance program. It is not intended to diagnose MRSA infection nor to guide or monitor treatment for MRSA infections. RESULT CALLED TO, READ BACK BY AND VERIFIED WITH: K.[facility] RN AT [phone]  04/14/2019 BY A.DAVIS Performed at [facility], [address] [place], [place] [address]      Marcos Eke, NP [facility] [phone] Pager  04/15/2019  2:04 PM

## 2019-04-16 DIAGNOSIS — N12 Tubulo-interstitial nephritis, not specified as acute or chronic: Secondary | ICD-10-CM

## 2019-04-16 LAB — HIV-1 RNA QUANT-NO REFLEX-BLD
HIV 1 RNA Quant: 401000 copies/mL
LOG10 HIV-1 RNA: 5.603 log10copy/mL

## 2019-04-16 MED ORDER — CEPHALEXIN 250 MG PO CAPS: 250.0000 mg | ORAL_CAPSULE | Freq: Four times a day (QID) | ORAL | 0 refills | Status: AC

## 2019-04-16 MED ORDER — SYMTUZA 800-150-200-10 MG PO TABS: 1.0000 | ORAL_TABLET | Freq: Every day | ORAL | 0 refills | Status: DC

## 2019-04-16 MED ORDER — SULFAMETHOXAZOLE-TRIMETHOPRIM 800-160 MG PO TABS: 1.0000 | ORAL_TABLET | Freq: Every day | ORAL | 1 refills | Status: DC

## 2019-04-16 MED ORDER — SYMTUZA 800-150-200-10 MG PO TABS: 1.0000 | ORAL_TABLET | Freq: Every day | ORAL | 1 refills | Status: DC

## 2019-04-16 MED FILL — SYMTUZA 800-150-200-10 MG T: 800-150-200 | 30 days supply | Qty: 30 | Fill #0

## 2019-04-16 MED FILL — SULFAMETHOXAZOLE-TMP DS TAB: 800-160 | 30 days supply | Qty: 30 | Fill #0

## 2019-04-16 MED FILL — CEPHALEXIN 250 MG CAPSULE: 250 | 10 days supply | Qty: 40 | Fill #0

## 2019-04-16 NOTE — Progress Notes (Signed)
   Subjective:  Mr. Chalmers was seen at bedside this morning. He states that he is feeling better than yesterday. We spoke about the results of his cryptococcal antigen coming back negative, which he found relieving. We spoke with him about adherence to his medications at home. We also, discussed discharging for today with a 10 day course of Keflex. He was agreeable to the plan. All questions and concerns were addressed.   Objective:  Vital signs in last 24 hours: Vitals:   04/15/19 2040 04/16/19 0229 04/16/19 0625 04/16/19 0928  BP: (!) 97/53  (!) 91/59 102/66  Pulse: 70  72 71  Resp: 19  18 18   Temp: 98 F (36.7 C)  97.8 F (36.6 C) 97.7 F (36.5 C)  TempSrc: Oral  Oral Oral  SpO2: 100%  99% 100%  Weight: 74.5 kg     Height:  6\' 1"  (1.854 m)      General: Appears comfortable at bedside, cooperative Cardiac: RRR, no murmurs, rubs, or gallops Pulmonary: Clear to auscultation bilaterally, no wheezes, rales, or rhonchi.  Abdomen: Soft, bowel sounds normal, non-tender to palpation Extremity:warm, well perfused without edema bilaterally Psychiatry: AAO x3, normal behavior and thought content    Assessment/Plan:  Principal Problem:   Pyelonephritis Active Problems:   Human immunodeficiency virus (HIV) disease ([place])   AIDS (acquired immune deficiency syndrome) ([place])   Homelessness   Headache   Leukopenia  L sided Pyelonephritis with E. Coli Bacteremia:  - ID recommends PO 10 day course of Keflex for discharge.  - Continue Rocephin to 2g IV QD. - Continue Oxycodone 5 mg IR tab Q4H PRN. - Diet regular.   HIV/AIDS: - Continue Symtuza 800-150-200-10 mg QAM - Continue Bactrim 800-160 mg QD - RNA quant: Pending - HIV-1 RNA, PCR: Pending - CD4 Count: <35  Constipation: (Resolved)   Diet: Regular VTE ppx: Lovenox  Code Status: FULL   Dispo: Anticipated discharge today, with the stipulation that patient receives medications before leaving.  Maudie Mercury, MD  04/16/2019, 11:38 AM Pager: [phone]

## 2019-04-16 NOTE — Discharge Summary (Addendum)
Name: Jacob Gutierrez MRN: [id number] DOB: August 31, 1985 33 y.o. PCP: Grayce Sessions, NP  Date of Admission: 04/12/2019  5:08 PM Date of Discharge:  04/16/2019 Attending Physician: Anne Shutter, MD  Discharge Diagnosis: 1. Pyelonephritis with E. coli Bacteremia  2. HIV/AIDS  Discharge Medications: Allergies as of 04/16/2019       Reactions   Amoxicillin Other (See Comments)   From childhood: "I had a reaction when i was little." (??)   Vancomycin Itching, Swelling, Other (See Comments)   Angioedema, also        Medication List     STOP taking these medications    cefdinir 300 MG capsule Commonly known as: OMNICEF       TAKE these medications    cephALEXin 250 MG capsule Commonly known as: KEFLEX Take 1 capsule (250 mg total) by mouth 4 (four) times daily for 10 days.   sulfamethoxazole-trimethoprim 800-160 MG tablet Commonly known as: BACTRIM DS Take 1 tablet by mouth daily.   Symtuza 800-150-200-10 MG Tabs Generic drug: Darunavir-Cobicisctat-Emtricitabine-Tenofovir Alafenamide Take 1 tablet by mouth daily with breakfast.        Disposition and follow-up:   Jacob Gutierrez was discharged from [facility] in Stable condition.  At the hospital follow up visit please address:  1.  Medication compliance, HIV monitoring, Health maintenance.   2.  Labs / imaging needed at time of follow-up: BMP  3.  Pending labs/ test needing follow-up: HIV-1 RNA, PCR, HIV-1 RNA quantitative   Follow-up Appointments: Follow-up Information     Grayce Sessions, NP. Schedule an appointment as soon as possible for a visit in 1 week(s).   Specialty: Internal Medicine Contact information: [address] [place] [address] [phone]         Gardiner Barefoot, MD .   Specialty: Infectious Diseases Contact information: [address] [place] [address] [phone]            Hospital Course by  problem list: 1. Pyelonephritis with E. Coli Bacteremia [name] came to [facility] presenting with a week of "back and kidney pain." He stated that he had pyelonephritis in the past and that this pain felt similar to previous episodes. On initial presentation his labs showed a urinary analysis which was positive for nitrites, large amounts of leukocytes, > 50 WBC's and rare bacteria. His CT renal showed no obvious evidence of pyelonephritis, however it was limited by lack of IV contrast. During his physical exam, he demonstrated CVA tenderness over left flank. He was started on Rocephin IV and given maintenance fluids. He was discharged with a 10 day course of cephalexin.   This is his 2nd episode of pyelonephritis this year, and he should follow up with urology.  2. HIV/AIDS [name] presented to [facility] with an infection with a past medical history significant for HIV/AIDs, with a new headache. While he stated that he was taking his HIV medication he did not know the name (Symtuza). He also takes Bactrim for PCP prophylaxis. His  CD4 T cells Abs was found to be <35,  CD4% Helper T cell was 7%, HIV1 RNA Quant was found to be 401,000. We spoke with Infectious Disease about his headache. They recommended  a cryptococcal antigen, which was negative. Ultimately, he was discharged with an appointment to see his ID doctors and given symtuza and bactrim.   Discharge Vitals:   BP 102/66 (BP Location: Left Arm)  Pulse 71   Temp 97.7 F (36.5 C) (Oral)   Resp 18   Ht 6\' 1"  (1.854 m)   Wt 74.5 kg   SpO2 100%   BMI 21.67 kg/m   Pertinent Labs, Studies, and Procedures:   Ref Range & Units 10d ago 48mo ago 71mo ago 5mo ago  CD4 T Cell Abs 400 - 1,790 /uL <35Low   61Low   170Low  R  10Low  R   Comment: REPEATED TO VERIFY  CD4 % Helper T Cell 33 - 65 % 7Low   8Low  CM  13Low  R, CM  3Low      Ref Range & Units 10d ago 77mo ago 68mo ago 61mo ago  HIV 1 RNA Quant copies/mL 401,000 VC   290,000 VC, CM  1,850High  R  1,220,000 VC, CM     Ref Range & Units 9d ago (04/15/19) 53mo ago (09/20/18) 58mo ago (09/20/18) 76mo ago (04/27/18)  Crypto Ag NEGATIVE NEGATIVE  NEGATIVE  NEGATIVE  NEGATIVE    CT Renal stone study:  IMPRESSION: No acute findings within the abdomen or pelvis. No source for left flank pain identified. No renal or ureteral calculi. No obvious evidence of pyelonephritis, however, characterization of the kidneys is limited by the lack of IV contrast. No bowel obstruction or evidence of active bowel wall inflammation.  Discharge Instructions: Discharge Instructions     Diet - low sodium heart healthy   Complete by: As directed    Discharge instructions   Complete by: As directed    Thank you for choosing [facility] for your medical assistance. You were diagnosed with pyelonephritis (an infected kidney) with e. Coli bacteremia. You were given IV antibiotics for your infection. You will be discharged with antibiotics, please take them as directed. Additionally, your CD4 count was found to be low, so please continue to take your anti retroviral medication (symtuza). Please call your infectious disease physician to schedule an appointment in the outpatient setting. Also, please schedule an appointment with your primary care physician. Please come back for reevaluation if your experience profuse diarrhea, worsening headache symptoms, increased back pain, high grade fevers, or worsening mental status.   Increase activity slowly   Complete by: As directed        Signed: Maudie Mercury, MD 04/16/2019, 11:27 AM   Pager: [phone]

## 2019-04-16 NOTE — TOC Transition Note (Signed)
Transition of Care [facility]) - CM/SW Discharge Note   Patient Details  Name: Jacob Gutierrez MRN: [id number] Date of Birth: [birth year]  [facility] of [facility]) CM/SW Contact:  Bartholomew Crews, RN Phone Number: [phone] 04/16/2019, 1:45 PM   Clinical Narrative:    Advised by nurse that patient needed a ride. Offered ride to patient who stated that he will take the bus. It's free, and he doesn't have a home to go to. Verified address in Epic which patient states is his mom's address, but he can't stay there. Asked if he will stay in shelter, patient states shelters are full. Patient states he will probably find a friend who he can stay with. Patient declined offer for additional community resources. No other transition of care needs identified.    Final next level of care: Homeless Shelter Barriers to Discharge: No Barriers Identified   Patient Goals and CMS Choice   CMS Medicare.gov Compare Post Acute Care list provided to:: Patient Choice offered to / list presented to : NA  Discharge Placement                       Discharge Plan and Services                DME Arranged: N/A DME Agency: NA       HH Arranged: NA HH Agency: NA        Social Determinants of Health (SDOH) Interventions     Readmission Risk Interventions No flowsheet data found.

## 2019-04-16 NOTE — Progress Notes (Signed)
Infectious Diseases Pharmacy Note  A 30 day supply of Symtuza and Bactrim has been delivered to patient bedside along with his 10 day supply of cephalexin. Patient was counseled on administration and instructed to be sure to return to the clinic for refills on his HIV medications. He has a f/u appointment with [name], NP on 04/30/2019.     Jimmy Footman, PharmD, BCPS, BCIDP Infectious Diseases Clinical Pharmacist Phone: [phone] 04/16/2019 12:01 PM

## 2019-04-18 LAB — CULTURE, BLOOD (ROUTINE X 2): Culture: NO GROWTH

## 2019-04-29 LAB — HIV-1 RNA, PCR (GRAPH) RFX/GENO EDI
HIV-1 RNA BY PCR: 88500 copies/mL
HIV-1 RNA Quant, Log: 4.947 log10copy/mL

## 2019-04-29 LAB — REFLEX TO GENOSURE(R) MG EDI

## 2019-04-30 ENCOUNTER — Inpatient Hospital Stay: Payer: Medicaid Other | Admitting: Family

## 2019-06-07 ENCOUNTER — Telehealth: Payer: Self-pay | Admitting: *Deleted

## 2019-06-07 NOTE — Telephone Encounter (Signed)
Patient called to ask if he could come to pick up his medications. Advised only if he could come before 12 and he can not so he is coming Monday 06/10/19 to get medication. Pharmacy staff is aware.

## 2019-06-11 ENCOUNTER — Ambulatory Visit (INDEPENDENT_AMBULATORY_CARE_PROVIDER_SITE_OTHER): Payer: Medicaid Other | Admitting: Pharmacist

## 2019-06-11 ENCOUNTER — Other Ambulatory Visit: Payer: Self-pay

## 2019-06-11 DIAGNOSIS — Z23 Encounter for immunization: Secondary | ICD-10-CM | POA: Diagnosis not present

## 2019-06-11 DIAGNOSIS — A64 Unspecified sexually transmitted disease: Secondary | ICD-10-CM

## 2019-06-11 DIAGNOSIS — Z79899 Other long term (current) drug therapy: Secondary | ICD-10-CM | POA: Diagnosis not present

## 2019-06-11 DIAGNOSIS — B20 Human immunodeficiency virus [HIV] disease: Secondary | ICD-10-CM | POA: Diagnosis not present

## 2019-06-11 MED ORDER — SYMTUZA 800-150-200-10 MG PO TABS: 1.0000 | ORAL_TABLET | Freq: Every day | ORAL | 3 refills | Status: DC

## 2019-06-11 MED ORDER — AZITHROMYCIN 600 MG PO TABS: 1200.0000 mg | ORAL_TABLET | ORAL | 2 refills | Status: DC

## 2019-06-11 MED ORDER — SULFAMETHOXAZOLE-TRIMETHOPRIM 800-160 MG PO TABS: 1.0000 | ORAL_TABLET | Freq: Every day | ORAL | 3 refills | Status: DC

## 2019-06-11 NOTE — Progress Notes (Signed)
HPI: Jacob Gutierrez is a 33 y.o. male who presents to the [facility] pharmacy clinic for HIV follow-up.  Patient Active Problem List   Diagnosis Date Noted  . Pyelonephritis 02/13/2019  . Prostatitis 10/05/2018  . Leukopenia 09/20/2018  . Thrombocytopenia (HCC) 09/20/2018  . Hyponatremia 09/20/2018  . 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 Headache 04/27/2018  . Condyloma 04/24/2018  . Protein-calorie malnutrition, severe 12/12/2017  . Personal history of MRSA (methicillin resistant Staphylococcus aureus) 12/09/2017  . History of ESBL E. coli infection 12/09/2017  . AIDS (acquired immune deficiency syndrome) (HCC)   . Homelessness   . 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 Tobacco use disorder 01/02/2014  . Human immunodeficiency virus (HIV) disease (HCC) 01/01/2014    Patient's Medications  New Prescriptions   No medications on file  Previous Medications   DARUNAVIR-COBICISCTAT-EMTRICITABINE-TENOFOVIR ALAFENAMIDE (SYMTUZA) 800-150-200-10 MG TABS    Take 1 tablet by mouth daily with breakfast.   SULFAMETHOXAZOLE-TRIMETHOPRIM (BACTRIM DS) 800-160 MG TABLET    Take 1 tablet by mouth daily.  Modified Medications   No medications on file  Discontinued Medications   No medications on file    Allergies: Allergies  Allergen Reactions  . Amoxicillin Other (See Comments)    From childhood: "I had a reaction when i was little." (??)  . Vancomycin Itching, Swelling and Other (See Comments)    Angioedema, also    Past Medical History: Past Medical History:  Diagnosis Date  . AIDS (acquired immune deficiency syndrome) (HCC)   . AIDS (acquired immune deficiency syndrome) (HCC)   . 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 H/O coccidioidomycosis    pulmonary   . HIV disease (HCC)   . Past history of allergy to penicillin-type antibiotic 07-03-2012   desensitization  . PNA (pneumonia)   . Shigella gastroenteritis   . Syphilis    history /treated      Social History: Social History   Socioeconomic History  . Marital status: Single    Spouse name: Not on file  . Number of children: Not on file  . Years of education: Not on file  . Highest education level: Not on file  Occupational History  . Not on file  Social Needs  . Financial resource strain: Not very hard  . Food insecurity    Worry: Never true    Inability: Never true  . Transportation needs    Medical: No    Non-medical: Not on file  Tobacco Use  . Smoking status: Current Every Day Smoker    Packs/day: 0.30    Types: Cigarettes  . Smokeless tobacco: Never Used  . Tobacco comment: cutting back  Substance and Sexual Activity  . Alcohol use: Yes    Alcohol/week: 1.0 standard drinks    Types: 1 Standard drinks or equivalent per week    Comment: whiskey occasionally   . Drug use: Yes    Frequency: 1.0 times per week    Types: Marijuana  . Sexual activity: Yes    Partners: Female, Male    Birth control/protection: Condom    Comment: "I have plenty of condoms, need lube"  Lifestyle  . Physical activity    Days per week: Patient refused    Minutes per session: Patient refused  . Stress: Not on file  Relationships  . Social connections    Talks on phone: Patient refused    Gets together: Patient refused    Attends religious service: Patient refused  Active member of club or organization: Patient refused    Attends meetings of clubs or organizations: Patient refused    Relationship status: Patient refused  Other Topics Concern  . Not on file  Social History Narrative  . Not on file    Labs: Lab Results  Component Value Date   HIV1RNAQUANT 401,000 04/14/2019   HIV1RNAQUANT 290,000 02/13/2019   HIV1RNAQUANT 1,850 (H) 10/24/2018   CD4TABS <35 (L) 04/14/2019   CD4TABS 61 (L) 02/13/2019   CD4TABS 170 (L) 10/24/2018    RPR and STI Lab Results  Component Value Date   LABRPR Reactive (A) 02/14/2019   LABRPR REACTIVE (A) 02/02/2018   LABRPR Reactive  (A) 12/13/2017   LABRPR Reactive (A) 03/06/2017   LABRPR REACTIVE (A) 12/17/2013   RPRTITER 1:2 (H) 02/02/2018   RPRTITER 1:2 12/17/2013    STI Results GC CT  03/06/2017 Negative Negative    Hepatitis B Lab Results  Component Value Date   HEPBSAB INDETER (A) 12/17/2013   HEPBSAG NON-REACTIVE 08/02/2018   HEPBCAB REACTIVE (A) 12/17/2013   Hepatitis C Lab Results  Component Value Date   HEPCAB NON-REACTIVE 08/02/2018   Hepatitis A Lab Results  Component Value Date   HAV NON-REACTIVE 10/24/2018   Lipids: Lab Results  Component Value Date   CHOL 126 12/17/2013   TRIG 152 (H) 12/17/2013   HDL 47 12/17/2013   CHOLHDL 2.7 12/17/2013   VLDL 30 12/17/2013   LDLCALC 49 12/17/2013    Current HIV Regimen: Symtuza  Assessment: Jacob Gutierrez is here to pick up his medications today. He reports feeling well today, no headaches, fevers, chills, shortness of breath or fatigue. He says he just started a new job about a month and a half ago and its been going well. It has been a little busy in his life with this new job and he says that is why he has not been able to get in to pick up his medications. He thinks it has been about 3 weeks since he has been on medication. We discussed how important adherence is and that his numbers do not look very good at the moment due to his poor adherence. We discussed sending his mediations to a pharmacy that is more convenient for him to get to and he liked this idea. We will send all of his medication to [facility] on [address], he has medicaid so it should not be a problem to fill these here. He has been on Symtuza before and has not had issues in the past. Patient also reports smoking marijuana prior to our meeting. He does deny the use of any other drugs besides this occasionally.   He has been on antibiotics before for opportunistic infection prevention, and he understands he will be on the Bactrim until his CD4 count is improved more consistently. We also  would like to start him on azithromycin today since his most recent CD4 is <50. He also has Aldara cream for his perianal warts, he says these do not hurt him anymore but he does not like how they look. We will check labs today, give his influenza vaccine and see him back at the start of next year.   Prescriptions for azithromycin, bactrim and symtuza were sent into [facility] on [address] so he has them when he needs refills.  Plan: - Continue Symtuza, Bactrim, Aldara cream and start Azithromycin - HIV RNA W/R GT, lipid profile, CMET, RPR, CD4, Urine cytology - Influenza vaccine - Follow up 08/07/2019 with  Cassie   Nicoletta Dress, PharmD PGY2 Infectious Disease Pharmacy Resident  [facility] 06/11/2019, 9:31 AM

## 2019-06-12 LAB — T-HELPER CELL (CD4) - (RCID CLINIC ONLY)
CD4 % Helper T Cell: 8 % — ABNORMAL LOW (ref 33–65)
CD4 T Cell Abs: 55 /uL — ABNORMAL LOW (ref 400–1790)

## 2019-06-28 LAB — COMPREHENSIVE METABOLIC PANEL
AG Ratio: 0.8 (calc) — ABNORMAL LOW (ref 1.0–2.5)
ALT: 16 U/L (ref 9–46)
AST: 33 U/L (ref 10–40)
Albumin: 3.7 g/dL (ref 3.6–5.1)
Alkaline phosphatase (APISO): 104 U/L (ref 36–130)
BUN: 14 mg/dL (ref 7–25)
CO2: 25 mmol/L (ref 20–32)
Calcium: 8.8 mg/dL (ref 8.6–10.3)
Chloride: 104 mmol/L (ref 98–110)
Creat: 0.92 mg/dL (ref 0.60–1.35)
Globulin: 4.4 g/dL (calc) — ABNORMAL HIGH (ref 1.9–3.7)
Glucose, Bld: 86 mg/dL (ref 65–99)
Potassium: 3.3 mmol/L — ABNORMAL LOW (ref 3.5–5.3)
Sodium: 136 mmol/L (ref 135–146)
Total Bilirubin: 0.4 mg/dL (ref 0.2–1.2)
Total Protein: 8.1 g/dL (ref 6.1–8.1)

## 2019-06-28 LAB — HIV-1 INTEGRASE GENOTYPE

## 2019-06-28 LAB — LIPID PANEL
Cholesterol: 134 mg/dL (ref <200)
HDL: 45 mg/dL (ref >=40)
LDL Cholesterol (Calc): 75 mg/dL (calc)
Non-HDL Cholesterol (Calc): 89 mg/dL (calc) (ref <130)
Total CHOL/HDL Ratio: 3 (calc) (ref <5.0)
Triglycerides: 49 mg/dL (ref <150)

## 2019-06-28 LAB — HIV RNA, RTPCR W/R GT (RTI, PI,INT)
HIV 1 RNA Quant: 1910000 copies/mL — ABNORMAL HIGH
HIV-1 RNA Quant, Log: 6.28 Log copies/mL — ABNORMAL HIGH

## 2019-06-28 LAB — FLUORESCENT TREPONEMAL AB(FTA)-IGG-BLD: Fluorescent Treponemal ABS: REACTIVE — AB

## 2019-06-28 LAB — RPR: RPR Ser Ql: REACTIVE — AB

## 2019-06-28 LAB — RPR TITER: RPR Titer: 1:1 {titer} — ABNORMAL HIGH

## 2019-06-28 LAB — HIV-1 GENOTYPE: HIV-1 Genotype: DETECTED — AB

## 2019-07-13 IMAGING — CT CT PELVIS W/ CM
2 of 3 series · 16 of 46 positions shown, 18 images · IV contrast (APPLIED)
Comparison: 01/26/2018

CLINICAL DATA: CT Pelvis with contrast due to scrotal mass or lump
Pt arrives to ED from with complaints of groin swelling around his
perineum for the last 3 days. EMS reports pt had prostate surgery [REDACTED].

EXAM:
CT PELVIS WITH CONTRAST
TECHNIQUE: Multidetector CT imaging of the pelvis was performed using the
standard protocol following the bolus administration of intravenous
contrast.
CONTRAST:  100mL OMNIPAQUE IOHEXOL 300 MG/ML  SOLN

[Series 3: pelvis 5.0 i30f 2 · axial · 0.92mm/px · z∈[+666,+996]mm · 13 of 76 slices shown, 15 images]
[im 5/76  soft-tissue]
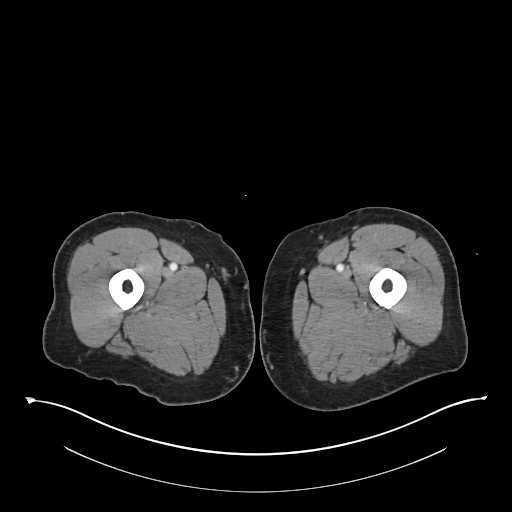
[im 5/76  bone]
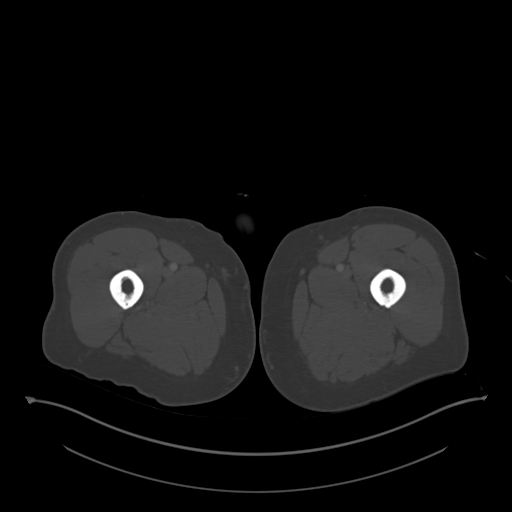
[im 10/76  soft-tissue]
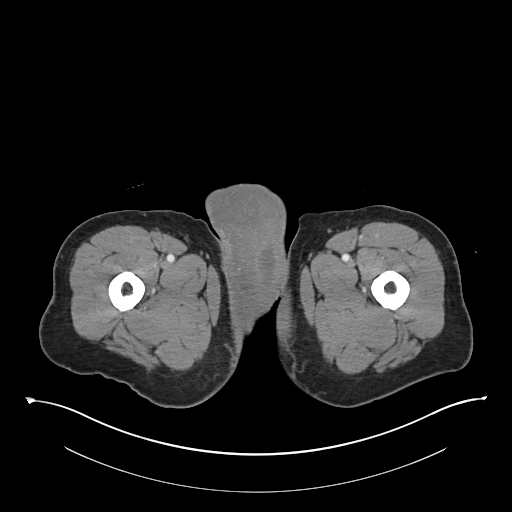
[im 15/76  soft-tissue]
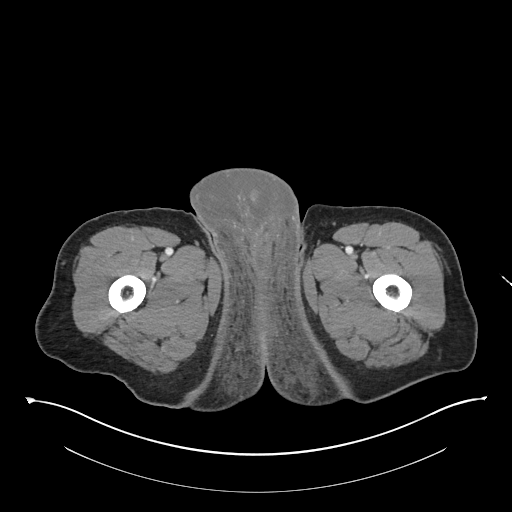
[im 22/76  soft-tissue]
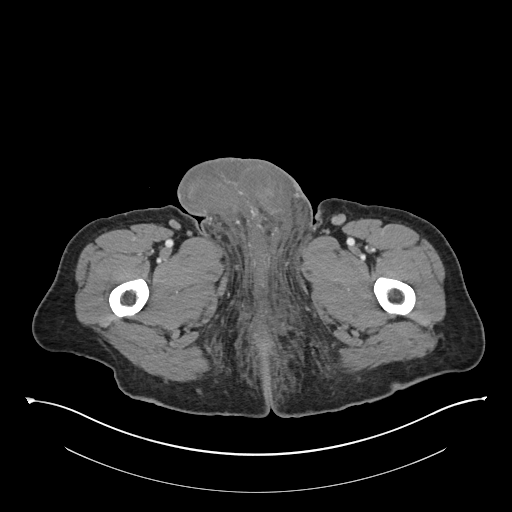
[im 27/76  soft-tissue]
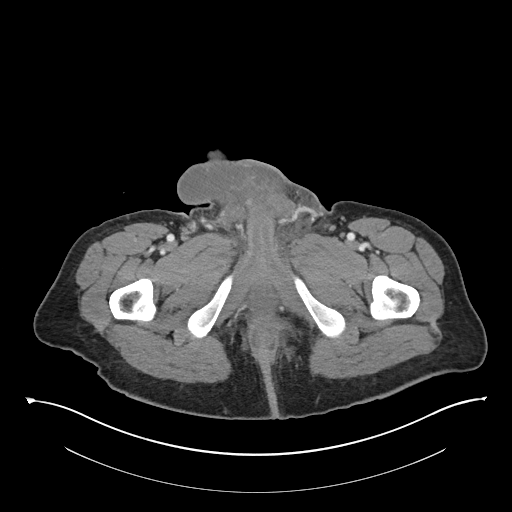
[im 32/76  soft-tissue]
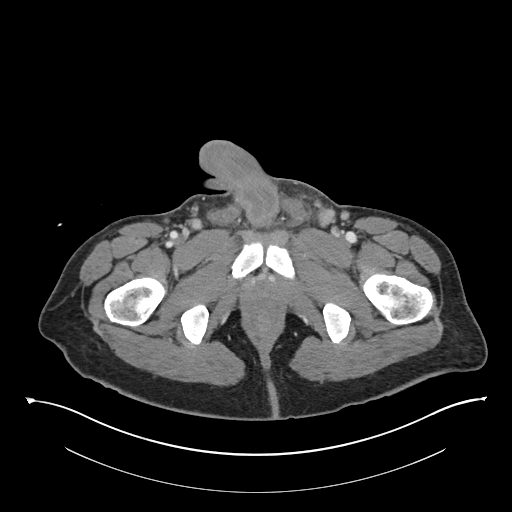
[im 39/76  soft-tissue]
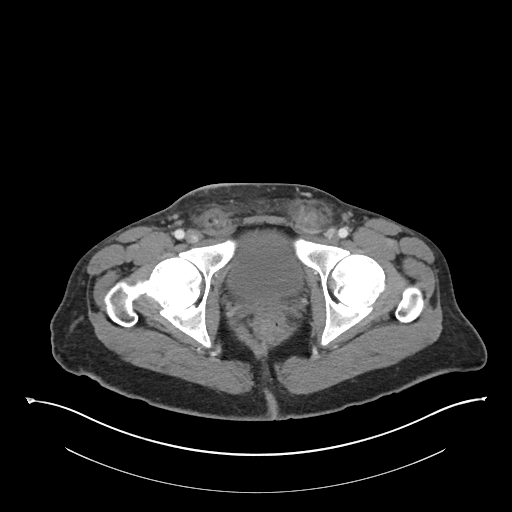
[im 44/76  soft-tissue]
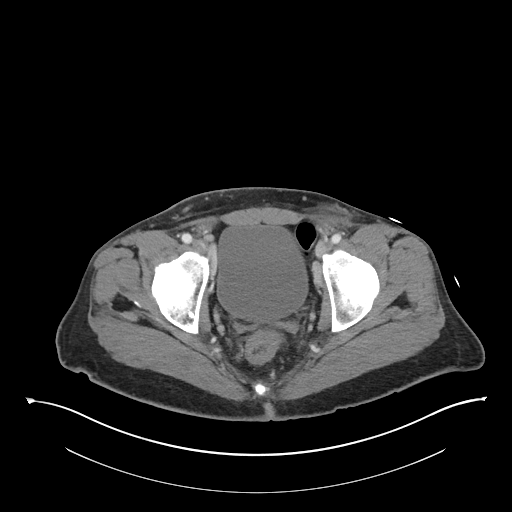
[im 49/76  soft-tissue]
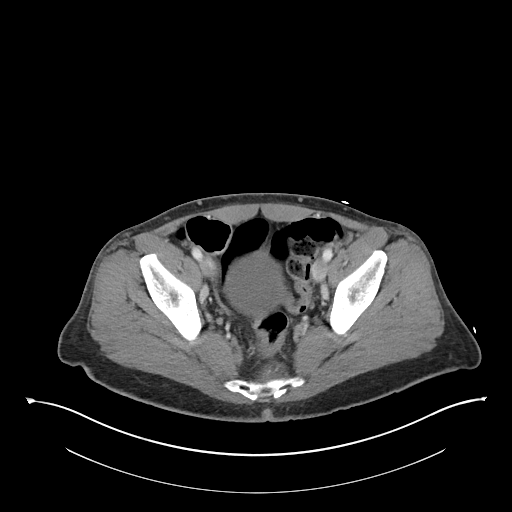
[im 49/76  bone]
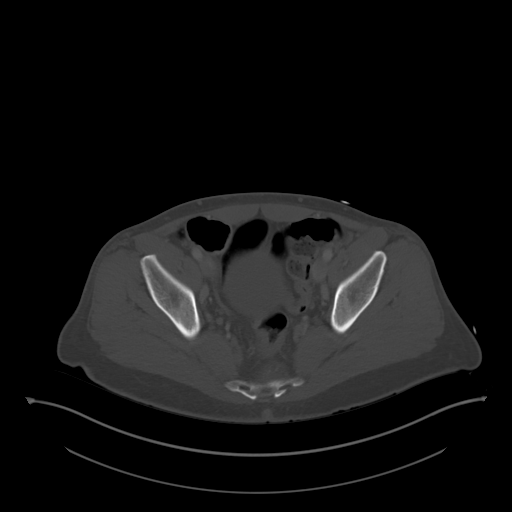
[im 54/76  soft-tissue]
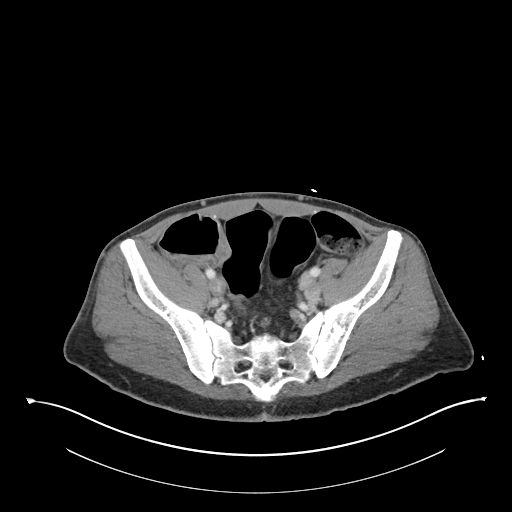
[im 61/76  soft-tissue]
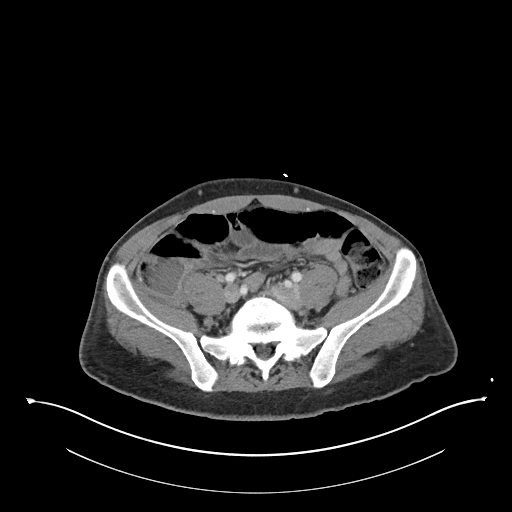
[im 66/76  soft-tissue]
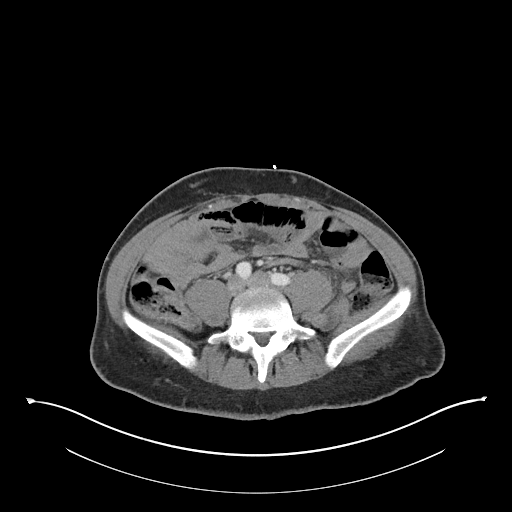
[im 71/76  soft-tissue]
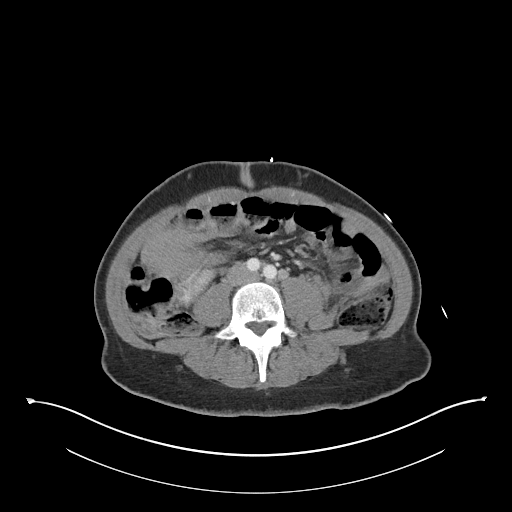

[Series 5: coronal soft tissue · coronal · 0.74mm/px · 3 of 107 slices shown]
[im 36/107  soft-tissue]
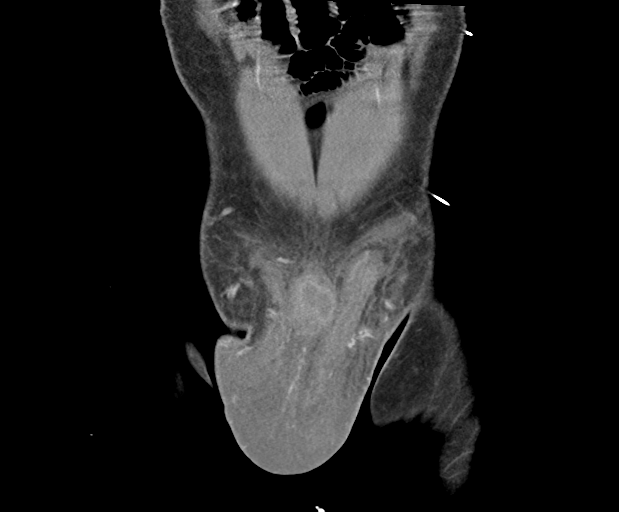
[im 48/107  soft-tissue]
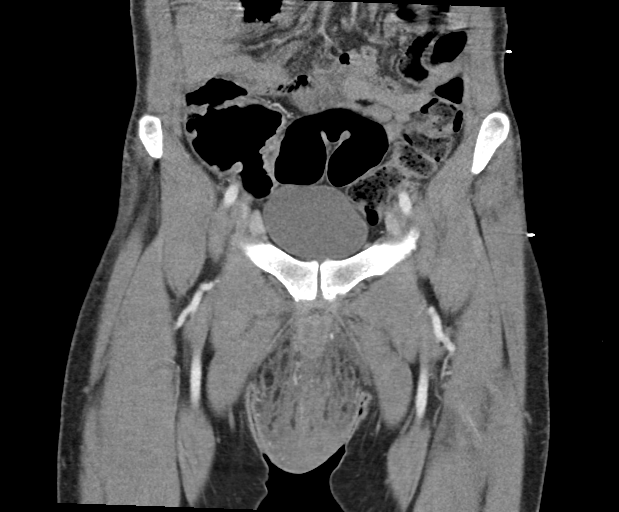
[im 59/107  soft-tissue]
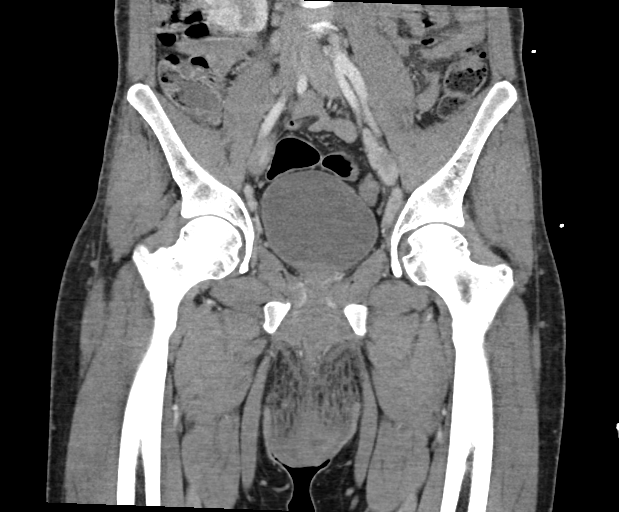

[16 of 46 positions shown; findings below may reference images not displayed]

FINDINGS: Urinary Tract: LOWER portion of the RIGHT kidney is unremarkable in
appearance. Urinary bladder is normal in appearance.

Bowel: There is diffuse mild rectal wall thickening and perirectal
stranding. Visualized bowel loops are unremarkable. The appendix is
well seen and has a normal appearance.

Vascular/Lymphatic: No significant vascular abnormality identified.
Numerous probably reactive lymph nodes in the inguinal regions
bilaterally, none measuring greater than 1.5 centimeters.

Reproductive: The prostate is normal in size. By history, the
patient has had TURP and prostatic abscess.

There is edema of the spermatic cord bilaterally, increased since
prior studies.

There is marked subcutaneous edema of the perineum, significantly
increased since prior study. Bilateral scrotal hydroceles are
present. Within the wall of the posterior inferior LEFT hemiscrotum,
there is a focal fluid collection which measures 2.0 x 3.7 x
centimeters, consistent with abscess.

Other: No free pelvic fluid. Anterior abdominal wall is
unremarkable.

Musculoskeletal: No suspicious bone lesions identified.
IMPRESSION: 1. Significant scrotal and perineal edema.
2. Interval development of an abscess in the posterior inferior LEFT
scrotal wall, 4.5 centimeters in maximum diameter.
3. Improved appearance of the prostate gland.
4. Persistent mild thickening of the rectal wall, improved compared
to prior studies.

## 2019-08-07 ENCOUNTER — Ambulatory Visit: Payer: Medicaid Other | Admitting: Pharmacist

## 2019-10-07 ENCOUNTER — Ambulatory Visit: Payer: Medicaid Other | Admitting: Pharmacist

## 2019-10-14 ENCOUNTER — Ambulatory Visit: Payer: Medicaid Other | Admitting: Pharmacist

## 2019-11-05 ENCOUNTER — Ambulatory Visit: Payer: Medicaid Other | Admitting: Pharmacist

## 2020-02-05 ENCOUNTER — Other Ambulatory Visit: Payer: Self-pay

## 2020-02-05 ENCOUNTER — Ambulatory Visit (INDEPENDENT_AMBULATORY_CARE_PROVIDER_SITE_OTHER): Payer: Medicaid Other | Admitting: Pharmacist

## 2020-02-05 DIAGNOSIS — B20 Human immunodeficiency virus [HIV] disease: Secondary | ICD-10-CM

## 2020-02-05 DIAGNOSIS — Z79899 Other long term (current) drug therapy: Secondary | ICD-10-CM

## 2020-02-05 DIAGNOSIS — Z113 Encounter for screening for infections with a predominantly sexual mode of transmission: Secondary | ICD-10-CM | POA: Diagnosis not present

## 2020-02-05 MED ORDER — SULFAMETHOXAZOLE-TRIMETHOPRIM 800-160 MG PO TABS: 1.0000 | ORAL_TABLET | Freq: Every day | ORAL | 5 refills | Status: DC

## 2020-02-05 MED ORDER — SYMTUZA 800-150-200-10 MG PO TABS: 1.0000 | ORAL_TABLET | Freq: Every day | ORAL | 5 refills | Status: DC

## 2020-02-05 MED ORDER — AZITHROMYCIN 600 MG PO TABS: 1200.0000 mg | ORAL_TABLET | ORAL | 5 refills | Status: DC

## 2020-02-05 NOTE — Progress Notes (Addendum)
HPI: Jacob Gutierrez is a 34 y.o. male who presents to the [facility] pharmacy clinic for HIV follow-up.  Patient Active Problem List   Diagnosis Date Noted  . Pyelonephritis 02/13/2019  . Prostatitis 10/05/2018  . Leukopenia 09/20/2018  . Thrombocytopenia (HCC) 09/20/2018  . Hyponatremia 09/20/2018  . 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 Headache 04/27/2018  . Condyloma 04/24/2018  . Protein-calorie malnutrition, severe 12/12/2017  . Personal history of MRSA (methicillin resistant Staphylococcus aureus) 12/09/2017  . History of ESBL E. coli infection 12/09/2017  . AIDS (acquired immune deficiency syndrome) (HCC)   . Homelessness   . 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 Tobacco use disorder 01/02/2014  . Human immunodeficiency virus (HIV) disease (HCC) 01/01/2014    Patient's Medications  New Prescriptions   No medications on file  Previous Medications   No medications on file  Modified Medications   Modified Medication Previous Medication   AZITHROMYCIN (ZITHROMAX) 600 MG TABLET azithromycin (ZITHROMAX) 600 MG tablet      Take 2 tablets (1,200 mg total) by mouth once a week.    Take 2 tablets (1,200 mg total) by mouth once a week.   DARUNAVIR-COBICISCTAT-EMTRICITABINE-TENOFOVIR ALAFENAMIDE (SYMTUZA) 800-150-200-10 MG TABS Darunavir-Cobicisctat-Emtricitabine-Tenofovir Alafenamide (SYMTUZA) 800-150-200-10 MG TABS      Take 1 tablet by mouth daily with breakfast.    Take 1 tablet by mouth daily with breakfast.   SULFAMETHOXAZOLE-TRIMETHOPRIM (BACTRIM DS) 800-160 MG TABLET sulfamethoxazole-trimethoprim (BACTRIM DS) 800-160 MG tablet      Take 1 tablet by mouth daily.    Take 1 tablet by mouth daily.  Discontinued Medications   No medications on file    Allergies: Allergies  Allergen Reactions  . Amoxicillin Other (See Comments)    From childhood: "I had a reaction when i was little." (??)  . Vancomycin Itching, Swelling and Other (See Comments)    Angioedema, also    Past Medical  History: Past Medical History:  Diagnosis Date  . AIDS (acquired immune deficiency syndrome) (HCC)   . AIDS (acquired immune deficiency syndrome) (HCC)   . 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 H/O coccidioidomycosis    pulmonary   . HIV disease (HCC)   . Past history of allergy to penicillin-type antibiotic 07-03-2012   desensitization  . PNA (pneumonia)   . Shigella gastroenteritis   . Syphilis    history /treated     Social History: Social History   Socioeconomic History  . Marital status: Single    Spouse name: Not on file  . Number of children: Not on file  . Years of education: Not on file  . Highest education level: Not on file  Occupational History  . Not on file  Tobacco Use  . Smoking status: Current Every Day Smoker    Packs/day: 0.30    Types: Cigarettes  . Smokeless tobacco: Never Used  . Tobacco comment: cutting back  Vaping Use  . Vaping Use: Never used  Substance and Sexual Activity  . Alcohol use: Yes    Alcohol/week: 1.0 standard drink    Types: 1 Standard drinks or equivalent per week    Comment: whiskey occasionally   . Drug use: Yes    Frequency: 1.0 times per week    Types: Marijuana  . Sexual activity: Yes    Partners: Female, Male    Birth control/protection: Condom    Comment: "I have plenty of condoms, need lube"  Other Topics Concern  . Not on file  Social History Narrative  . Not on file   Social Determinants of Health   Financial Resource Strain:   . Difficulty of Paying Living Expenses:   Food Insecurity:   . Worried About Programme researcher, broadcasting/film/video in the Last Year:   . Barista in the Last Year:   Transportation Needs:   . Freight forwarder (Medical):   Marland Kitchen Lack of Transportation (Non-Medical):   Physical Activity:   . Days of Exercise per Week:   . Minutes of Exercise per Session:   Stress:   . Feeling of Stress :   Social Connections:   . Frequency of Communication with  Friends and Family:   . Frequency of Social Gatherings with Friends and Family:   . Attends Religious Services:   . Active Member of Clubs or Organizations:   . Attends Banker Meetings:   Marland Kitchen Marital Status:     Labs: Lab Results  Component Value Date   HIV1RNAQUANT 1,910,000 (H) 06/11/2019   HIV1RNAQUANT 401,000 04/14/2019   HIV1RNAQUANT 290,000 02/13/2019   CD4TABS 55 (L) 06/11/2019   CD4TABS <35 (L) 04/14/2019   CD4TABS 61 (L) 02/13/2019    RPR and STI Lab Results  Component Value Date   LABRPR REACTIVE (A) 06/11/2019   LABRPR Reactive (A) 02/14/2019   LABRPR REACTIVE (A) 02/02/2018   LABRPR Reactive (A) 12/13/2017   LABRPR Reactive (A) 03/06/2017   RPRTITER 1:1 (H) 06/11/2019   RPRTITER 1:2 (H) 02/02/2018   RPRTITER 1:2 12/17/2013    STI Results GC CT  03/06/2017 Negative Negative    Hepatitis B Lab Results  Component Value Date   HEPBSAB INDETER (A) 12/17/2013   HEPBSAG NON-REACTIVE 08/02/2018   HEPBCAB REACTIVE (A) 12/17/2013   Hepatitis C Lab Results  Component Value Date   HEPCAB NON-REACTIVE 08/02/2018   Hepatitis A Lab Results  Component Value Date   HAV NON-REACTIVE 10/24/2018   Lipids: Lab Results  Component Value Date   CHOL 134 06/11/2019   TRIG 49 06/11/2019   HDL 45 06/11/2019   CHOLHDL 3.0 06/11/2019   VLDL 30 12/17/2013   LDLCALC 75 06/11/2019    Current HIV Regimen: Symtuza  Assessment: Samson is here today to follow up for his HIV infection.  He has been out of care and was last seen by me in November 2020. He was off of his medications at that time and his HIV viral load was 1.9 million with a CD4 count of 55. 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 He states that he restarted his medications after that appointment but stopped taking them 3-4 months ago again. He wasn't "able to get" his refills. 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 He currently has a warrant out  on him due to being involved with shop lifting and stealing a car that his friend did while he was with him.  He knows he needs to be on medications. He states that he feels great - no n/v/d, headaches, itching, rash, blurry vision, dizziness, or shortness of breath. His appetite and weight are good. Will restart his medications for him today.  He is asking for it to be bubble wrapped as he hates walking around with the bottles making noises in his bag. Will send to [name] as this is the only one I know that does bubble packs.  Will check labs today and see him back in 6 weeks  for an adherence check.  Plan: - Restart Symtuza daily, Bactrim daily, and weekly azithromycin - HIV viral load, CD4, RPR, urine cytology, CMET. CBC with diff - F/u with me again 8/25 at 145pm  Diago Haik L. Cagney Steenson, PharmD, BCIDP, AAHIVP, CPP Clinical Pharmacist Practitioner Infectious Diseases Clinical Pharmacist [facility] 02/05/2020, 4:20 PM

## 2020-02-06 LAB — T-HELPER CELL (CD4) - (RCID CLINIC ONLY)
CD4 % Helper T Cell: 1 % — ABNORMAL LOW (ref 33–65)
CD4 T Cell Abs: <35 /uL — ABNORMAL LOW (ref 400–1790)

## 2020-02-07 LAB — CBC WITH DIFFERENTIAL/PLATELET
Absolute Monocytes: 391 cells/uL (ref 200–950)
Basophils Absolute: 31 cells/uL (ref 0–200)
Basophils Relative: 1 %
Eosinophils Absolute: 90 cells/uL (ref 15–500)
Eosinophils Relative: 2.9 %
HCT: 37.8 % — ABNORMAL LOW (ref 38.5–50.0)
Hemoglobin: 13.2 g/dL (ref 13.2–17.1)
Lymphs Abs: 704 cells/uL — ABNORMAL LOW (ref 850–3900)
MCH: 32.9 pg (ref 27.0–33.0)
MCHC: 34.9 g/dL (ref 32.0–36.0)
MCV: 94.3 fL (ref 80.0–100.0)
MPV: 9.3 fL (ref 7.5–12.5)
Monocytes Relative: 12.6 %
Neutro Abs: 1885 cells/uL (ref 1500–7800)
Neutrophils Relative %: 60.8 %
Platelets: 207 10*3/uL (ref 140–400)
RBC: 4.01 10*6/uL — ABNORMAL LOW (ref 4.20–5.80)
RDW: 12.1 % (ref 11.0–15.0)
Total Lymphocyte: 22.7 %
WBC: 3.1 10*3/uL — ABNORMAL LOW (ref 3.8–10.8)

## 2020-02-07 LAB — COMPREHENSIVE METABOLIC PANEL
AG Ratio: 0.8 (calc) — ABNORMAL LOW (ref 1.0–2.5)
ALT: 11 U/L (ref 9–46)
AST: 22 U/L (ref 10–40)
Albumin: 3.8 g/dL (ref 3.6–5.1)
Alkaline phosphatase (APISO): 72 U/L (ref 36–130)
BUN: 12 mg/dL (ref 7–25)
CO2: 25 mmol/L (ref 20–32)
Calcium: 8.6 mg/dL (ref 8.6–10.3)
Chloride: 100 mmol/L (ref 98–110)
Creat: 0.79 mg/dL (ref 0.60–1.35)
Globulin: 4.7 g/dL (calc) — ABNORMAL HIGH (ref 1.9–3.7)
Glucose, Bld: 80 mg/dL (ref 65–99)
Potassium: 3.5 mmol/L (ref 3.5–5.3)
Sodium: 135 mmol/L (ref 135–146)
Total Bilirubin: 0.4 mg/dL (ref 0.2–1.2)
Total Protein: 8.5 g/dL — ABNORMAL HIGH (ref 6.1–8.1)

## 2020-02-07 LAB — RPR TITER: RPR Titer: 1:2 {titer} — ABNORMAL HIGH

## 2020-02-07 LAB — HIV-1 RNA QUANT-NO REFLEX-BLD
HIV 1 RNA Quant: 498000 copies/mL — ABNORMAL HIGH
HIV-1 RNA Quant, Log: 5.7 Log copies/mL — ABNORMAL HIGH

## 2020-02-07 LAB — FLUORESCENT TREPONEMAL AB(FTA)-IGG-BLD: Fluorescent Treponemal ABS: REACTIVE — AB

## 2020-02-07 LAB — RPR: RPR Ser Ql: REACTIVE — AB

## 2020-02-13 ENCOUNTER — Ambulatory Visit: Payer: Medicaid Other | Admitting: Pharmacist

## 2020-02-19 ENCOUNTER — Telehealth: Payer: Self-pay

## 2020-02-19 ENCOUNTER — Emergency Department (HOSPITAL_COMMUNITY)
Admission: EM | Admit: 2020-02-19 | Discharge: 2020-02-20 | Disposition: A | Discharge status: Home / Self Care | Payer: Medicaid Other | Attending: Emergency Medicine | Admitting: Emergency Medicine

## 2020-02-19 ENCOUNTER — Encounter (HOSPITAL_COMMUNITY): Payer: Self-pay | Admitting: Emergency Medicine

## 2020-02-19 DIAGNOSIS — B20 Human immunodeficiency virus [HIV] disease: Secondary | ICD-10-CM | POA: Diagnosis not present

## 2020-02-19 DIAGNOSIS — K6289 Other specified diseases of anus and rectum: Secondary | ICD-10-CM | POA: Diagnosis not present

## 2020-02-19 DIAGNOSIS — F17210 Nicotine dependence, cigarettes, uncomplicated: Secondary | ICD-10-CM | POA: Insufficient documentation

## 2020-02-19 DIAGNOSIS — R531 Weakness: Secondary | ICD-10-CM | POA: Diagnosis not present

## 2020-02-19 DIAGNOSIS — R5383 Other fatigue: Secondary | ICD-10-CM | POA: Diagnosis not present

## 2020-02-19 DIAGNOSIS — R110 Nausea: Secondary | ICD-10-CM | POA: Diagnosis not present

## 2020-02-19 DIAGNOSIS — R109 Unspecified abdominal pain: Secondary | ICD-10-CM | POA: Diagnosis present

## 2020-02-19 LAB — COMPREHENSIVE METABOLIC PANEL
ALT: 13 U/L (ref 0–44)
AST: 24 U/L (ref 15–41)
Albumin: 3.1 g/dL — ABNORMAL LOW (ref 3.5–5.0)
Alkaline Phosphatase: 71 U/L (ref 38–126)
Anion gap: 11 (ref 5–15)
BUN: 16 mg/dL (ref 6–20)
CO2: 23 mmol/L (ref 22–32)
Calcium: 8.8 mg/dL — ABNORMAL LOW (ref 8.9–10.3)
Chloride: 98 mmol/L (ref 98–111)
Creatinine, Ser: 1.02 mg/dL (ref 0.61–1.24)
GFR calc Af Amer: >60 mL/min (ref >60)
GFR calc non Af Amer: >60 mL/min (ref >60)
Glucose, Bld: 104 mg/dL — ABNORMAL HIGH (ref 70–99)
Potassium: 3.3 mmol/L — ABNORMAL LOW (ref 3.5–5.1)
Sodium: 132 mmol/L — ABNORMAL LOW (ref 135–145)
Total Bilirubin: 0.6 mg/dL (ref 0.3–1.2)
Total Protein: 9 g/dL — ABNORMAL HIGH (ref 6.5–8.1)

## 2020-02-19 LAB — CBC
HCT: 41.3 % (ref 39.0–52.0)
Hemoglobin: 13.5 g/dL (ref 13.0–17.0)
MCH: 31.4 pg (ref 26.0–34.0)
MCHC: 32.7 g/dL (ref 30.0–36.0)
MCV: 96 fL (ref 80.0–100.0)
Platelets: 265 10*3/uL (ref 150–400)
RBC: 4.3 MIL/uL (ref 4.22–5.81)
RDW: 12.5 % (ref 11.5–15.5)
WBC: 4.4 10*3/uL (ref 4.0–10.5)
nRBC: 0 % (ref 0.0–0.2)

## 2020-02-19 LAB — LIPASE, BLOOD: Lipase: 32 U/L (ref 11–51)

## 2020-02-19 MED ORDER — SODIUM CHLORIDE 0.9% FLUSH
3.0000 mL | Freq: Once | INTRAVENOUS | Status: AC
  Administered 2020-02-20: 3 mL via INTRAVENOUS

## 2020-02-19 NOTE — ED Triage Notes (Signed)
Pt arrives to ED with c/o of constipation for over 1 week, pt states he has had small amounts of stool. Pt reports feeling bloated. No emesis.

## 2020-02-19 NOTE — Telephone Encounter (Signed)
Confirmed with pharmacy no PA needed for Azithromycin.  Marland Kitchenmw

## 2020-02-20 ENCOUNTER — Other Ambulatory Visit: Payer: Self-pay

## 2020-02-20 ENCOUNTER — Emergency Department (HOSPITAL_COMMUNITY): Payer: Medicaid Other

## 2020-02-20 LAB — URINALYSIS, ROUTINE W REFLEX MICROSCOPIC
Bilirubin Urine: NEGATIVE
Glucose, UA: NEGATIVE mg/dL
Hgb urine dipstick: NEGATIVE
Ketones, ur: NEGATIVE mg/dL
Nitrite: NEGATIVE
Protein, ur: 30 mg/dL — AB
Specific Gravity, Urine: 1.018 (ref 1.005–1.030)
pH: 6 (ref 5.0–8.0)

## 2020-02-20 MED ORDER — SODIUM CHLORIDE 0.9 % IV SOLN
1.0000 g | Freq: Once | INTRAVENOUS | Status: AC
  Administered 2020-02-20: 1 g via INTRAVENOUS
  Filled 2020-02-20: qty 10

## 2020-02-20 MED ORDER — IOHEXOL 300 MG/ML  SOLN
100.0000 mL | Freq: Once | INTRAMUSCULAR | Status: AC | PRN
  Administered 2020-02-20: 100 mL via INTRAVENOUS

## 2020-02-20 MED ORDER — DOXYCYCLINE HYCLATE 100 MG PO CAPS
100.0000 mg | ORAL_CAPSULE | Freq: Two times a day (BID) | ORAL | 0 refills | Status: DC
Start: 2020-02-20 — End: 2020-08-07

## 2020-02-20 MED ORDER — SODIUM CHLORIDE 0.9 % IV BOLUS
1000.0000 mL | Freq: Once | INTRAVENOUS | Status: AC
  Administered 2020-02-20: 1000 mL via INTRAVENOUS

## 2020-02-20 MED ORDER — ONDANSETRON HCL 4 MG/2ML IJ SOLN
4.0000 mg | Freq: Once | INTRAMUSCULAR | Status: AC
  Administered 2020-02-20: 4 mg via INTRAVENOUS
  Filled 2020-02-20: qty 2

## 2020-02-20 NOTE — ED Provider Notes (Signed)
[facility] EMERGENCY DEPARTMENT Provider Note   CSN: [id number] Arrival date & time: 02/19/20  2156     History Chief Complaint  Patient presents with  . Constipation    Jacob Gutierrez is a 34 y.o. male.  HPI Patient presents with abdominal pain constipation and blood in the stool.  States that he has not really had a bowel movement for around a week.  States he does have small amounts of stool and now blood with wiping and blood in the bowl.  Feels bloating.  Has had some nausea with vomiting.  Somewhat diffuse mild abdominal pain.  History of HIV and AIDS.  Has been off his medicine for a while and recently started back on rhythm.    Past Medical History:  Diagnosis Date  . AIDS (acquired immune deficiency syndrome) (HCC)   . AIDS (acquired immune deficiency syndrome) (HCC)   . 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 H/O coccidioidomycosis    pulmonary   . HIV disease (HCC)   . Past history of allergy to penicillin-type antibiotic 07-03-2012   desensitization  . PNA (pneumonia)   . Shigella gastroenteritis   . Syphilis    history /treated     Patient Active Problem List   Diagnosis Date Noted  . Pyelonephritis 02/13/2019  . Prostatitis 10/05/2018  . Leukopenia 09/20/2018  . Thrombocytopenia (HCC) 09/20/2018  . Hyponatremia 09/20/2018  . 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 Headache 04/27/2018  . Condyloma 04/24/2018  . Protein-calorie malnutrition, severe 12/12/2017  . Personal history of MRSA (methicillin resistant Staphylococcus aureus) 12/09/2017  . History of ESBL E. coli infection 12/09/2017  . AIDS (acquired immune deficiency syndrome) (HCC)   . Homelessness   . 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 Tobacco use disorder 01/02/2014  . Human immunodeficiency virus (HIV) disease (HCC) 01/01/2014    Past Surgical History:  Procedure Laterality Date  . TRANSURETHRAL RESECTION OF PROSTATE N/A 12/09/2017    Procedure: TRANSURETHRAL RESECTION OF THE PROSTATE (TURP);  Surgeon: Crist Fat, MD;  Location: [facility];  Service: Urology;  Laterality: N/A;       Family History  Problem Relation Age of Onset  . Hypertension Mother   . Lupus Maternal Grandmother   . Cancer Paternal Grandfather     Social History   Tobacco Use  . Smoking status: Current Every Day Smoker    Packs/day: 0.30    Types: Cigarettes  . Smokeless tobacco: Never Used  . Tobacco comment: cutting back  Vaping Use  . Vaping Use: Never used  Substance Use Topics  . Alcohol use: Yes    Alcohol/week: 1.0 standard drink    Types: 1 Standard drinks or equivalent per week    Comment: whiskey occasionally   . Drug use: Yes    Frequency: 1.0 times per week    Types: Marijuana    Home Medications Prior to Admission medications   Medication Sig Start Date End Date Taking? Authorizing Provider  azithromycin (ZITHROMAX) 600 MG tablet Take 2 tablets (1,200 mg total) by mouth once a week. 02/05/20   Kuppelweiser, Cassie L, RPH-CPP  Darunavir-Cobicisctat-Emtricitabine-Tenofovir Alafenamide (SYMTUZA) 800-150-200-10 MG TABS Take 1 tablet by mouth daily with breakfast. 02/05/20   Kuppelweiser, Cassie L, RPH-CPP  doxycycline (VIBRAMYCIN) 100 MG capsule Take 1 capsule (100 mg total) by mouth 2 (two) times daily. 02/20/20   Benjiman Core, MD  sulfamethoxazole-trimethoprim (BACTRIM DS) 800-160 MG tablet Take 1 tablet by mouth daily. 02/05/20  Kuppelweiser, Cassie L, RPH-CPP    Allergies    Amoxicillin and Vancomycin  Review of Systems   Review of Systems  Constitutional: Positive for appetite change and fatigue.  HENT: Negative for congestion.   Respiratory: Negative for shortness of breath.   Cardiovascular: Negative for chest pain.  Gastrointestinal: Positive for abdominal pain, blood in stool and nausea. Negative for vomiting.  Genitourinary: Negative for flank pain.  Musculoskeletal: Negative for back pain.    Neurological: Positive for weakness.  Psychiatric/Behavioral: Negative for confusion.    Physical Exam Updated Vital Signs BP (!) 114/87 (BP Location: Right Arm)   Pulse 85   Temp 98.2 F (36.8 C) (Oral)   Resp 16   Ht 6\' 1"  (1.854 m)   Wt 74.8 kg   SpO2 99%   BMI 21.77 kg/m   Physical Exam Vitals reviewed.  Constitutional:      Comments: Patient appears somewhat malnourished  HENT:     Head: Normocephalic.  Eyes:     Pupils: Pupils are equal, round, and reactive to light.  Cardiovascular:     Rate and Rhythm: Regular rhythm.  Pulmonary:     Breath sounds: No wheezing or rhonchi.  Abdominal:     Tenderness: There is abdominal tenderness.     Comments: Mild diffuse abdominal tenderness without rebound or guarding.  Musculoskeletal:        General: No tenderness.     Cervical back: Neck supple.  Skin:    General: Skin is warm.  Neurological:     Mental Status: He is alert.   Rectal exam showed some excoriation/irritation externally.  Mild tenderness on rectal exam.  ED Results / Procedures / Treatments   Labs (all labs ordered are listed, but only abnormal results are displayed) Labs Reviewed  COMPREHENSIVE METABOLIC PANEL - Abnormal; Notable for the following components:      Result Value   Sodium 132 (*)    Potassium 3.3 (*)    Glucose, Bld 104 (*)    Calcium 8.8 (*)    Total Protein 9.0 (*)    Albumin 3.1 (*)    All other components within normal limits  URINALYSIS, ROUTINE W REFLEX MICROSCOPIC - Abnormal; Notable for the following components:   APPearance HAZY (*)    Protein, ur 30 (*)    Leukocytes,Ua SMALL (*)    Bacteria, UA RARE (*)    All other components within normal limits  LIPASE, BLOOD  CBC  GC/CHLAMYDIA PROBE AMP ([place]) NOT AT [facility]    EKG None  Radiology CT ABDOMEN PELVIS W CONTRAST  Result Date: 02/20/2020 CLINICAL DATA:  Pain and constipation EXAM: CT ABDOMEN AND PELVIS WITH CONTRAST TECHNIQUE: Multidetector CT imaging of  the abdomen and pelvis was performed using the standard protocol following bolus administration of intravenous contrast. CONTRAST:  02/22/2020 OMNIPAQUE IOHEXOL 300 MG/ML  SOLN COMPARISON:  April 13, 2019 FINDINGS: Lower chest: Lung bases are clear. Hepatobiliary: No focal liver lesions are evident. The gallbladder wall is not appreciably thickened. There is no biliary duct dilatation. Pancreas: There is no pancreatic mass or inflammatory focus. Spleen: No splenic lesions are appreciable. Adrenals/Urinary Tract: Adrenals bilaterally appear normal. Kidneys bilaterally show no evident mass or hydronephrosis on either side. There is no evident renal or ureteral calculus on either side. Urinary bladder is midline with wall thickness within normal limits. Stomach/Bowel: There is mild rectal wall thickening without soft tissue stranding or fluid in this area. No perirectal abscess or fistula. No bowel wall  thickening is seen elsewhere. There is no evident bowel obstruction. The terminal ileum appears normal. There is no appreciable free air or portal venous air. Vascular/Lymphatic: There is no abdominal aortic aneurysm. No arterial vascular lesions are evident. Major venous structures appear patent. There is no evident adenopathy in the abdomen or pelvis. Subcentimeter retroperitoneal lymph nodes do not meet size criteria for pathologic significance. Reproductive: Prostate and seminal vesicles appear normal in size and contour. Other: The appendix appears normal. No evident abscess or ascites in the abdomen or pelvis. Musculoskeletal: Probable small bone islands noted in the left iliac bone, stable. No lytic or destructive bone lesions are evident. No intramuscular or abdominal wall lesions are evident. IMPRESSION: 1. Slight thickening of the rectal wall noted. There may be a degree of proctitis. Note that there is no associated fluid or soft tissue stranding. No abscess or fistula in this area. No bowel wall thickening  elsewhere noted. 2. No evident bowel obstruction. No abscess in the abdomen pelvis. Appendix appears normal. 3. No renal or ureteral calculus. No hydronephrosis. Urinary bladder wall thickness normal. Electronically Signed   By: Bretta Bang III M.D.   On: 02/20/2020 10:46    Procedures Procedures (including critical care time)  Medications Ordered in ED Medications  sodium chloride flush (NS) 0.9 % injection 3 mL (3 mLs Intravenous Given 02/20/20 0817)  sodium chloride 0.9 % bolus 1,000 mL (0 mLs Intravenous Stopped 02/20/20 1057)  ondansetron (ZOFRAN) injection 4 mg (4 mg Intravenous Given 02/20/20 0815)  iohexol (OMNIPAQUE) 300 MG/ML solution 100 mL (100 mLs Intravenous Contrast Given 02/20/20 1016)  cefTRIAXone (ROCEPHIN) 1 g in sodium chloride 0.9 % 100 mL IVPB (0 g Intravenous Stopped 02/20/20 1400)    ED Course  I have reviewed the triage vital signs and the nursing notes.  Pertinent labs & imaging results that were available during my care of the patient were reviewed by me and considered in my medical decision making (see chart for details).    MDM Rules/Calculators/A&P                          Patient with rectal pain abdominal pain constipation.  Lab work overall reassuring but CT scan shows proctitis.  Known HIV/AIDS.  Discussed with Dr. Algis Liming from infectious disease.  Given Rocephin with doxycycline for home.  GC chlamydia swabs will need to be followed.  Can be reported to infectious disease clinic.  Patient will follow up with him in a month or sooner if things do not improve.  Overall well-appearing.  Discharge home. Final Clinical Impression(s) / ED Diagnoses Final diagnoses:  Proctitis  AIDS (acquired immune deficiency syndrome) (HCC)    Rx / DC Orders ED Discharge Orders         Ordered    doxycycline (VIBRAMYCIN) 100 MG capsule  2 times daily     Discontinue  Reprint     02/20/20 1339           Benjiman Core, MD 02/20/20 1615

## 2020-02-20 NOTE — Discharge Instructions (Signed)
Follow-up the infectious disease clinic.  Return for worsening symptoms.  They should see you in about a month but if you are doing worse they may be able to get you in sooner.  Also some testing has been done no need to be followed by them.

## 2020-02-21 LAB — GC/CHLAMYDIA PROBE AMP (~~LOC~~) NOT AT ARMC
Chlamydia: NEGATIVE
Comment: NEGATIVE
Comment: NORMAL
Neisseria Gonorrhea: POSITIVE — AB

## 2020-03-07 ENCOUNTER — Emergency Department (HOSPITAL_COMMUNITY)
Admission: EM | Admit: 2020-03-07 | Discharge: 2020-03-08 | Disposition: A | Discharge status: Home / Self Care | Payer: Medicaid Other | Attending: Emergency Medicine | Admitting: Emergency Medicine

## 2020-03-07 ENCOUNTER — Encounter (HOSPITAL_COMMUNITY): Payer: Self-pay | Admitting: Emergency Medicine

## 2020-03-07 ENCOUNTER — Other Ambulatory Visit: Payer: Self-pay

## 2020-03-07 DIAGNOSIS — F17210 Nicotine dependence, cigarettes, uncomplicated: Secondary | ICD-10-CM | POA: Insufficient documentation

## 2020-03-07 DIAGNOSIS — R197 Diarrhea, unspecified: Secondary | ICD-10-CM | POA: Diagnosis present

## 2020-03-07 DIAGNOSIS — B20 Human immunodeficiency virus [HIV] disease: Secondary | ICD-10-CM

## 2020-03-07 DIAGNOSIS — E876 Hypokalemia: Secondary | ICD-10-CM | POA: Diagnosis not present

## 2020-03-07 DIAGNOSIS — K5900 Constipation, unspecified: Secondary | ICD-10-CM | POA: Insufficient documentation

## 2020-03-07 DIAGNOSIS — R600 Localized edema: Secondary | ICD-10-CM | POA: Insufficient documentation

## 2020-03-07 LAB — COMPREHENSIVE METABOLIC PANEL
ALT: 18 U/L (ref 0–44)
AST: 29 U/L (ref 15–41)
Albumin: 2.9 g/dL — ABNORMAL LOW (ref 3.5–5.0)
Alkaline Phosphatase: 54 U/L (ref 38–126)
Anion gap: 8 (ref 5–15)
BUN: 16 mg/dL (ref 6–20)
CO2: 23 mmol/L (ref 22–32)
Calcium: 8.7 mg/dL — ABNORMAL LOW (ref 8.9–10.3)
Chloride: 106 mmol/L (ref 98–111)
Creatinine, Ser: 0.83 mg/dL (ref 0.61–1.24)
GFR calc Af Amer: >60 mL/min (ref >60)
GFR calc non Af Amer: >60 mL/min (ref >60)
Glucose, Bld: 97 mg/dL (ref 70–99)
Potassium: 3.1 mmol/L — ABNORMAL LOW (ref 3.5–5.1)
Sodium: 137 mmol/L (ref 135–145)
Total Bilirubin: 0.4 mg/dL (ref 0.3–1.2)
Total Protein: 7.9 g/dL (ref 6.5–8.1)

## 2020-03-07 LAB — CBC
HCT: 34.5 % — ABNORMAL LOW (ref 39.0–52.0)
Hemoglobin: 11.6 g/dL — ABNORMAL LOW (ref 13.0–17.0)
MCH: 33 pg (ref 26.0–34.0)
MCHC: 33.6 g/dL (ref 30.0–36.0)
MCV: 98 fL (ref 80.0–100.0)
Platelets: 274 10*3/uL (ref 150–400)
RBC: 3.52 MIL/uL — ABNORMAL LOW (ref 4.22–5.81)
RDW: 13.4 % (ref 11.5–15.5)
WBC: 5.5 10*3/uL (ref 4.0–10.5)
nRBC: 0 % (ref 0.0–0.2)

## 2020-03-07 LAB — URINALYSIS, ROUTINE W REFLEX MICROSCOPIC
Bilirubin Urine: NEGATIVE
Glucose, UA: NEGATIVE mg/dL
Hgb urine dipstick: NEGATIVE
Ketones, ur: NEGATIVE mg/dL
Nitrite: POSITIVE — AB
Protein, ur: 30 mg/dL — AB
Specific Gravity, Urine: 1.02 (ref 1.005–1.030)
WBC, UA: >50 WBC/hpf — ABNORMAL HIGH (ref 0–5)
pH: 6 (ref 5.0–8.0)

## 2020-03-07 LAB — LIPASE, BLOOD: Lipase: 57 U/L — ABNORMAL HIGH (ref 11–51)

## 2020-03-07 NOTE — ED Triage Notes (Signed)
C/o diarrhea x 3 weeks.  Denies abd pain.

## 2020-03-08 ENCOUNTER — Telehealth (HOSPITAL_COMMUNITY): Payer: Self-pay

## 2020-03-08 LAB — GASTROINTESTINAL PANEL BY PCR, STOOL (REPLACES STOOL CULTURE)
Adenovirus F40/41: NOT DETECTED
Astrovirus: NOT DETECTED
Campylobacter species: NOT DETECTED
Cryptosporidium: NOT DETECTED
Cyclospora cayetanensis: NOT DETECTED
Entamoeba histolytica: NOT DETECTED
Enteroaggregative E coli (EAEC): NOT DETECTED
Enteropathogenic E coli (EPEC): NOT DETECTED
Enterotoxigenic E coli (ETEC): NOT DETECTED
Giardia lamblia: NOT DETECTED
Norovirus GI/GII: DETECTED — AB
Plesimonas shigelloides: NOT DETECTED
Rotavirus A: NOT DETECTED
Salmonella species: NOT DETECTED
Sapovirus (I, II, IV, and V): NOT DETECTED
Shiga like toxin producing E coli (STEC): NOT DETECTED
Shigella/Enteroinvasive E coli (EIEC): DETECTED — AB
Vibrio cholerae: NOT DETECTED
Vibrio species: NOT DETECTED
Yersinia enterocolitica: NOT DETECTED

## 2020-03-08 MED ORDER — POTASSIUM CHLORIDE CRYS ER 20 MEQ PO TBCR: 40.0000 meq | EXTENDED_RELEASE_TABLET | Freq: Once | ORAL | Status: DC

## 2020-03-08 MED ORDER — DICYCLOMINE HCL 20 MG PO TABS
20.0000 mg | ORAL_TABLET | Freq: Two times a day (BID) | ORAL | 0 refills | Status: DC
Start: 2020-03-08 — End: 2020-08-07

## 2020-03-08 MED ORDER — CIPROFLOXACIN HCL 500 MG PO TABS
500.0000 mg | ORAL_TABLET | Freq: Two times a day (BID) | ORAL | 0 refills | Status: AC
Start: 2020-03-08 — End: 2020-03-15

## 2020-03-08 MED ORDER — CIPROFLOXACIN HCL 500 MG PO TABS
500.0000 mg | ORAL_TABLET | Freq: Once | ORAL | Status: AC
  Administered 2020-03-08: 500 mg via ORAL
  Filled 2020-03-08: qty 1

## 2020-03-08 NOTE — ED Provider Notes (Signed)
[facility] EMERGENCY DEPARTMENT Provider Note   CSN: [id number] Arrival date & time: 03/07/20  1619     History Chief Complaint  Patient presents with  . Diarrhea    Jacob Gutierrez is a 34 y.o. male.  HPI    Patient has a history of HIV infection. Has been seen in the infectious disease clinic most recently in July of this year. At that time the patient had not been compliant with any of his medications. Patient was restarted on his HIV medications. The patient's viral load was elevated at approximately 500,000. Patient's CD4 count is decreased at less than 35. Patient comes to the ED with complaints of persistent diarrhea. Patient states his symptoms started over the last month. He has been experiencing frequent stools, maybe 10 episodes per day. The stools are mucousy, watery and occasionally bloody. Patient does have abdominal cramping with this. He denies any fevers or chills. No vomiting. Patient was seen in the emergency room on July 28 and at that time he was experiencing some small amount of blood with constipation and not diarrhea. He was diagnosed with proctitis and started on doxycycline. Patient states his symptoms have not improved at all since that treatment.  Past Medical History:  Diagnosis Date  . AIDS (acquired immune deficiency syndrome) (HCC)   . AIDS (acquired immune deficiency syndrome) (HCC)   . 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 H/O coccidioidomycosis    pulmonary   . HIV disease (HCC)   . Past history of allergy to penicillin-type antibiotic 07-03-2012   desensitization  . PNA (pneumonia)   . Shigella gastroenteritis   . Syphilis    history /treated     Patient Active Problem List   Diagnosis Date Noted  . Pyelonephritis 02/13/2019  . Prostatitis 10/05/2018  . Leukopenia 09/20/2018  . Thrombocytopenia (HCC) 09/20/2018  . Hyponatremia 09/20/2018  . 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  Headache 04/27/2018  . Condyloma 04/24/2018  . Protein-calorie malnutrition, severe 12/12/2017  . Personal history of MRSA (methicillin resistant Staphylococcus aureus) 12/09/2017  . History of ESBL E. coli infection 12/09/2017  . AIDS (acquired immune deficiency syndrome) (HCC)   . Homelessness   . 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 Tobacco use disorder 01/02/2014  . Human immunodeficiency virus (HIV) disease (HCC) 01/01/2014    Past Surgical History:  Procedure Laterality Date  . TRANSURETHRAL RESECTION OF PROSTATE N/A 12/09/2017   Procedure: TRANSURETHRAL RESECTION OF THE PROSTATE (TURP);  Surgeon: Crist Fat, MD;  Location: [facility];  Service: Urology;  Laterality: N/A;       Family History  Problem Relation Age of Onset  . Hypertension Mother   . Lupus Maternal Grandmother   . Cancer Paternal Grandfather     Social History   Tobacco Use  . Smoking status: Current Every Day Smoker    Packs/day: 0.30    Types: Cigarettes  . Smokeless tobacco: Never Used  . Tobacco comment: cutting back  Vaping Use  . Vaping Use: Never used  Substance Use Topics  . Alcohol use: Yes    Alcohol/week: 1.0 standard drink    Types: 1 Standard drinks or equivalent per week    Comment: whiskey occasionally   . Drug use: Yes    Frequency: 1.0 times per week    Types: Marijuana    Home Medications Prior to Admission medications   Medication Sig Start Date End Date Taking? Authorizing Provider  azithromycin [facility])  600 MG tablet Take 2 tablets (1,200 mg total) by mouth once a week. 02/05/20   Kuppelweiser, Cassie L, RPH-CPP  ciprofloxacin (CIPRO) 500 MG tablet Take 1 tablet (500 mg total) by mouth 2 (two) times daily for 7 days. 03/08/20 03/15/20  Linwood Dibbles, MD  Darunavir-Cobicisctat-Emtricitabine-Tenofovir Alafenamide [facility]) 800-150-200-10 MG TABS Take 1 tablet by mouth daily with breakfast. 02/05/20   Kuppelweiser, Cassie L, RPH-CPP  dicyclomine (BENTYL) 20 MG tablet Take 1  tablet (20 mg total) by mouth 2 (two) times daily. 03/08/20   Linwood Dibbles, MD  doxycycline (VIBRAMYCIN) 100 MG capsule Take 1 capsule (100 mg total) by mouth 2 (two) times daily. 02/20/20   Benjiman Core, MD  sulfamethoxazole-trimethoprim (BACTRIM DS) 800-160 MG tablet Take 1 tablet by mouth daily. 02/05/20   Kuppelweiser, Cassie L, RPH-CPP    Allergies    Amoxicillin and Vancomycin  Review of Systems   Review of Systems  All other systems reviewed and are negative.   Physical Exam Updated Vital Signs BP 109/74   Pulse 74   Temp 97.9 F (36.6 C) (Oral)   Resp 18   SpO2 97%   Physical Exam Vitals and nursing note reviewed.  Constitutional:      Appearance: He is well-developed. He is ill-appearing.     Comments: Underweight, gaunt appearance  HENT:     Head: Normocephalic and atraumatic.     Right Ear: External ear normal.     Left Ear: External ear normal.  Eyes:     General: No scleral icterus.       Right eye: No discharge.        Left eye: No discharge.     Conjunctiva/sclera: Conjunctivae normal.  Neck:     Trachea: No tracheal deviation.  Cardiovascular:     Rate and Rhythm: Normal rate and regular rhythm.  Pulmonary:     Effort: Pulmonary effort is normal. No respiratory distress.     Breath sounds: Normal breath sounds. No stridor. No wheezing or rales.  Abdominal:     General: Bowel sounds are normal. There is no distension.     Palpations: Abdomen is soft.     Tenderness: There is no abdominal tenderness. There is no guarding or rebound.  Musculoskeletal:        General: No tenderness.     Cervical back: Neck supple.     Right lower leg: Edema present.     Left lower leg: Edema present.  Skin:    General: Skin is warm and dry.     Findings: No rash.  Neurological:     Mental Status: He is alert.     Cranial Nerves: No cranial nerve deficit (no facial droop, extraocular movements intact, no slurred speech).     Sensory: No sensory deficit.     Motor:  No abnormal muscle tone or seizure activity.     Coordination: Coordination normal.     ED Results / Procedures / Treatments   Labs (all labs ordered are listed, but only abnormal results are displayed) Labs Reviewed  LIPASE, BLOOD - Abnormal; Notable for the following components:      Result Value   Lipase 57 (*)    All other components within normal limits  COMPREHENSIVE METABOLIC PANEL - Abnormal; Notable for the following components:   Potassium 3.1 (*)    Calcium 8.7 (*)    Albumin 2.9 (*)    All other components within normal limits  CBC - Abnormal; Notable for the following  components:   RBC 3.52 (*)    Hemoglobin 11.6 (*)    HCT 34.5 (*)    All other components within normal limits  URINALYSIS, ROUTINE W REFLEX MICROSCOPIC - Abnormal; Notable for the following components:   APPearance HAZY (*)    Protein, ur 30 (*)    Nitrite POSITIVE (*)    Leukocytes,Ua LARGE (*)    WBC, UA >50 (*)    Bacteria, UA FEW (*)    All other components within normal limits  URINE CULTURE  GASTROINTESTINAL PANEL BY PCR, STOOL (REPLACES STOOL CULTURE)    EKG None  Radiology No results found.  Procedures Procedures (including critical care time)  Medications Ordered in ED Medications  ciprofloxacin (CIPRO) tablet 500 mg (500 mg Oral Given 03/08/20 3299)    ED Course  I have reviewed the triage vital signs and the nursing notes.  Pertinent labs & imaging results that were available during my care of the patient were reviewed by me and considered in my medical decision making (see chart for details).  Clinical Course as of Mar 09 847  Wynelle Link Mar 08, 2020  2426 D/w Dr Luciana Axe.  Most important for him to be on his HIV meds.  Most likely related to that.  No empiric abx indicated at this time.  Stool studies likely not to be too helpful either.   [JK]    Clinical Course User Index [JK] Linwood Dibbles, MD   MDM Rules/Calculators/A&P                          Patient presented to ED  for evaluation of persistent diarrhea in the setting of poorly controlled HIV disease.  Patient is nontoxic and afebrile.  He does not have any abdominal tenderness.  Laboratory test did not show any signs of significant abnormalities.  Patient had mild hypokalemia and was given an oral dose of potassium.  Discussed the case with Dr. Joaquim Lai, infectious disease.  He feels the symptoms are most likely related to his poorly controlled HIV disease.  Does not recommend any empiric antibiotics for his diarrhea.  I will start the patient on Cipro considering his abnormal urinalysis.  I have sent off a urine culture.  I will give the patient a prescription of Bentyl for his abdominal cramping.  Encourage close follow-up with his infectious disease doctors and compliance with his HIV meds. Final Clinical Impression(s) / ED Diagnoses Final diagnoses:  Diarrhea, unspecified type  HIV disease (HCC)    Rx / DC Orders ED Discharge Orders         Ordered    dicyclomine (BENTYL) 20 MG tablet  2 times daily     Discontinue  Reprint     03/08/20 0848    ciprofloxacin (CIPRO) 500 MG tablet  2 times daily     Discontinue  Reprint     03/08/20 0848           Linwood Dibbles, MD 03/08/20 [phone]

## 2020-03-08 NOTE — Discharge Instructions (Signed)
Take the medications as prescribed.  Follow up with your doctor at the infectious disease clinic.

## 2020-03-10 LAB — URINE CULTURE: Culture: >=100000 — AB

## 2020-03-11 ENCOUNTER — Telehealth: Payer: Self-pay | Admitting: *Deleted

## 2020-03-11 NOTE — Telephone Encounter (Signed)
Post ED Visit - Positive Culture Follow-up  Culture report reviewed by antimicrobial stewardship pharmacist: [facility] []  , Pharm.D. [x]  Enzo Bi, Pharm.D., BCPS AQ-ID []  , Pharm.D., BCPS []  Celedonio Miyamoto, Pharm.D., BCPS []  [place], Garvin Fila.D., BCPS, AAHIVP []  , Pharm.D., BCPS, AAHIVP []  Georgina Pillion, PharmD, BCPS []  , PharmD, BCPS []  [place], PharmD, BCPS []  [address], PharmD []  , PharmD, BCPS []  Estella Husk, PharmD  Pharmacy Team []  Lysle Pearl, PharmD []  , PharmD []  Phillips Climes, PharmD []  , Rph []  Agapito Games) , PharmD []  Verlan Friends, PharmD []  , PharmD []  Mervyn Gay, PharmD []  , PharmD []  Vinnie Level, PharmD []  [facility], PharmD []  , PharmD []  Len Childs, PharmD   Positive urine culture Treated with Ciprofloxacin HCL, organism sensitive to the same and no further patient follow-up is required at this time.  [facility] 03/11/2020, 10:08 AM

## 2020-03-18 ENCOUNTER — Ambulatory Visit: Payer: Medicaid Other | Admitting: Pharmacist

## 2020-04-08 ENCOUNTER — Ambulatory Visit: Payer: Medicaid Other | Admitting: Pharmacist

## 2020-07-08 ENCOUNTER — Ambulatory Visit (INDEPENDENT_AMBULATORY_CARE_PROVIDER_SITE_OTHER): Payer: Medicaid Other | Admitting: Pharmacist

## 2020-07-08 ENCOUNTER — Other Ambulatory Visit: Payer: Self-pay | Admitting: Pharmacist

## 2020-07-08 ENCOUNTER — Other Ambulatory Visit: Payer: Self-pay

## 2020-07-08 DIAGNOSIS — B20 Human immunodeficiency virus [HIV] disease: Secondary | ICD-10-CM

## 2020-07-08 MED ORDER — AZITHROMYCIN 600 MG PO TABS: 1200.0000 mg | ORAL_TABLET | ORAL | 5 refills | Status: DC

## 2020-07-08 MED ORDER — SULFAMETHOXAZOLE-TRIMETHOPRIM 800-160 MG PO TABS: 1.0000 | ORAL_TABLET | Freq: Every day | ORAL | 5 refills | Status: DC

## 2020-07-08 MED ORDER — SYMTUZA 800-150-200-10 MG PO TABS: 1.0000 | ORAL_TABLET | Freq: Every day | ORAL | 5 refills | Status: DC

## 2020-07-08 NOTE — Progress Notes (Signed)
HPI: Jacob Gutierrez is a 34 y.o. male who presents to the [facility] pharmacy clinic for HIV follow-up.  Patient Active Problem List   Diagnosis Date Noted  . Pyelonephritis 02/13/2019  . Prostatitis 10/05/2018  . Leukopenia 09/20/2018  . Thrombocytopenia (HCC) 09/20/2018  . Hyponatremia 09/20/2018  . 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 Headache 04/27/2018  . Condyloma 04/24/2018  . Protein-calorie malnutrition, severe 12/12/2017  . Personal history of MRSA (methicillin resistant Staphylococcus aureus) 12/09/2017  . History of ESBL E. coli infection 12/09/2017  . AIDS (acquired immune deficiency syndrome) (HCC)   . Homelessness   . 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 Tobacco use disorder 01/02/2014  . Human immunodeficiency virus (HIV) disease (HCC) 01/01/2014    Patient's Medications  New Prescriptions   No medications on file  Previous Medications   AZITHROMYCIN (ZITHROMAX) 600 MG TABLET    Take 2 tablets (1,200 mg total) by mouth once a week.   DARUNAVIR-COBICISCTAT-EMTRICITABINE-TENOFOVIR ALAFENAMIDE (SYMTUZA) 800-150-200-10 MG TABS    Take 1 tablet by mouth daily with breakfast.   DICYCLOMINE (BENTYL) 20 MG TABLET    Take 1 tablet (20 mg total) by mouth 2 (two) times daily.   DOXYCYCLINE (VIBRAMYCIN) 100 MG CAPSULE    Take 1 capsule (100 mg total) by mouth 2 (two) times daily.   SULFAMETHOXAZOLE-TRIMETHOPRIM (BACTRIM DS) 800-160 MG TABLET    Take 1 tablet by mouth daily.  Modified Medications   No medications on file  Discontinued Medications   No medications on file    Allergies: Allergies  Allergen Reactions  . Amoxicillin Other (See Comments)    From childhood: "I had a reaction when i was little." (??)  . Vancomycin Itching, Swelling and Other (See Comments)    Angioedema, also    Past Medical History: Past Medical History:  Diagnosis Date  . AIDS (acquired immune deficiency syndrome) (HCC)   . AIDS (acquired immune deficiency syndrome) (HCC)   . 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 H/O coccidioidomycosis    pulmonary   . HIV disease (HCC)   . Past history of allergy to penicillin-type antibiotic 07-03-2012   desensitization  . PNA (pneumonia)   . Shigella gastroenteritis   . Syphilis    history /treated     Social History: Social History   Socioeconomic History  . Marital status: Single    Spouse name: Not on file  . Number of children: Not on file  . Years of education: Not on file  . Highest education level: Not on file  Occupational History  . Not on file  Tobacco Use  . Smoking status: Current Every Day Smoker    Packs/day: 0.30    Types: Cigarettes  . Smokeless tobacco: Never Used  . Tobacco comment: cutting back  Vaping Use  . Vaping Use: Never used  Substance and Sexual Activity  . Alcohol use: Yes    Alcohol/week: 1.0 standard drink    Types: 1 Standard drinks or equivalent per week    Comment: whiskey occasionally   . Drug use: Yes    Frequency: 1.0 times per week    Types: Marijuana  . Sexual activity: Yes    Partners: Female, Male    Birth control/protection: Condom    Comment: "I have plenty of condoms, need lube"  Other Topics Concern  . Not on file  Social History Narrative  . Not on file   Social Determinants of Health   Financial Resource  Strain: Not on file  Food Insecurity: Not on file  Transportation Needs: Not on file  Physical Activity: Not on file  Stress: Not on file  Social Connections: Not on file    Labs: Lab Results  Component Value Date   HIV1RNAQUANT 498,000 (H) 02/05/2020   HIV1RNAQUANT 1,910,000 (H) 06/11/2019   HIV1RNAQUANT 401,000 04/14/2019   CD4TABS <35 (L) 02/05/2020   CD4TABS 55 (L) 06/11/2019   CD4TABS <35 (L) 04/14/2019    RPR and STI Lab Results  Component Value Date   LABRPR REACTIVE (A) 02/05/2020   LABRPR REACTIVE (A) 06/11/2019   LABRPR Reactive (A) 02/14/2019   LABRPR REACTIVE (A) 02/02/2018   LABRPR Reactive (A)  12/13/2017   RPRTITER 1:2 (H) 02/05/2020   RPRTITER 1:1 (H) 06/11/2019   RPRTITER 1:2 (H) 02/02/2018   RPRTITER 1:2 12/17/2013    STI Results GC CT  02/20/2020 Positive(A) Negative  03/06/2017 Negative Negative    Hepatitis B Lab Results  Component Value Date   HEPBSAB INDETER (A) 12/17/2013   HEPBSAG NON-REACTIVE 08/02/2018   HEPBCAB REACTIVE (A) 12/17/2013   Hepatitis C Lab Results  Component Value Date   HEPCAB NON-REACTIVE 08/02/2018   Hepatitis A Lab Results  Component Value Date   HAV NON-REACTIVE 10/24/2018   Lipids: Lab Results  Component Value Date   CHOL 134 06/11/2019   TRIG 49 06/11/2019   HDL 45 06/11/2019   CHOLHDL 3.0 06/11/2019   VLDL 30 12/17/2013   LDLCALC 75 06/11/2019    Current HIV Regimen: Symtyza  Assessment: Jacob Gutierrez is here today after falling out of care again. I last saw him in July 2021 but he last saw a provider in April 2020. At the last visit, he was off of his medications again and his RNA was 498,000 with a CD4 count of <35. He has missed several appointments with me between his last visit and today.   He asked for bubble wrap with his medications, so I sent them to [name] which he said he could easily get to. He now tells me it is hard to get there and he has been off of his medications again for months. [facility] previously filled his medications and couriered them to the clinic where he would pick up, but he eventually stopped. He is requesting that service again today as he says it is easier to get here than any other pharrnacy. I will set that up for him again.  I asked him what we could do to help because I am not sure what to do at this point since he does not want to take medications. He was previously working with Jacob Gutierrez, but I dont think that worked out.  He is still working and complains of congestion and sore throat today. He also states that he has constipation but quickly changed it to diarrhea  sometimes. I advised him that he proably needs to see one of our providers soon as it has been over a year and a half since that happened. I will schedule him with Jacob Gutierrez in 2 weeks.  I gave him a bottle of Symtuza today to last for a month and will have his other medications here to pick up at his appointment on 12/30. Will check a RNA and CD4 count today, but I assume they will not be good. Will ask the front staff to set up transportation for him to this appointment as well.  Medication Samples have been provided to the patient.  Drug name: Symtuza        Strength: 800/150/200/10 mg Qty: #30 LOT: [id number] Exp.Date: 10/22/2021  Dosing instructions: Take one tablet by mouth once daily with food  The patient has been instructed regarding the correct time, dose, and frequency of taking this medication, including desired effects and most common side effects.   Plan: - Symtuza sample bottle given today #30 - HIV RNA and CD4 today - F/u with Jacob Gutierrez 12/30 at 930am  Taleya Whitcher L. Ayianna Darnold, PharmD, BCIDP, AAHIVP, CPP Clinical Pharmacist Practitioner Infectious Diseases Clinical Pharmacist [facility] 07/08/2020, 9:32 AM

## 2020-07-09 LAB — T-HELPER CELL (CD4) - (RCID CLINIC ONLY)
CD4 % Helper T Cell: 1 % — ABNORMAL LOW (ref 33–65)
CD4 T Cell Abs: <35 /uL — ABNORMAL LOW (ref 400–1790)

## 2020-07-10 MED FILL — SULFAMETHOXAZOLE-TMP DS TAB: 800-160 | 30 days supply | Qty: 30 | Fill #0

## 2020-07-10 MED FILL — SYMTUZA 800-150-200-10 MG T: 800-150-200 | 30 days supply | Qty: 30 | Fill #0

## 2020-07-11 LAB — HIV-1 RNA QUANT-NO REFLEX-BLD
HIV 1 RNA Quant: 733000 Copies/mL — ABNORMAL HIGH
HIV-1 RNA Quant, Log: 5.87 Log cps/mL — ABNORMAL HIGH

## 2020-07-15 ENCOUNTER — Telehealth: Payer: Self-pay

## 2020-07-15 NOTE — Telephone Encounter (Signed)
[facility] Patient Advocate Encounter  Patient's medication have been couriered to [facility] from [facility] and will be picked up on 07/23/20.  Clearance Coots , CPhT Specialty Pharmacy Patient [facility] Phone: [phone] Fax:  [phone]

## 2020-07-23 ENCOUNTER — Other Ambulatory Visit: Payer: Self-pay

## 2020-07-23 ENCOUNTER — Ambulatory Visit (INDEPENDENT_AMBULATORY_CARE_PROVIDER_SITE_OTHER): Payer: Medicaid Other | Admitting: Family

## 2020-07-23 ENCOUNTER — Encounter: Payer: Self-pay | Admitting: Family

## 2020-07-23 VITALS — BP 99/67 | HR 99 | Temp 98.3°F | Ht 73.0 in | Wt 180.0 lb

## 2020-07-23 DIAGNOSIS — B20 Human immunodeficiency virus [HIV] disease: Secondary | ICD-10-CM | POA: Diagnosis present

## 2020-07-23 DIAGNOSIS — Z0000 Encounter for general adult medical examination without abnormal findings: Secondary | ICD-10-CM

## 2020-07-23 MED FILL — AZITHROMYCIN 600 MG TABS: 600 | 28 days supply | Qty: 8 | Fill #0

## 2020-07-23 NOTE — Assessment & Plan Note (Addendum)
Jacob Gutierrez has poorly controlled AIDS with CD4 count <35 secondary to less than optimal adherence to his ART regimen. Fortunately does not appear to have opportunistic infection but remains as significantly high risk. Discussed importance of taking medications to prevent further complications and disease progression. Restarting on Symtuza with Bactrim and azithromycin for OI prophylaxis. Plan for follow up in 1 month or sooner if needed.

## 2020-07-23 NOTE — Patient Instructions (Addendum)
Nice to see you.  We will get you restarted on your Symtuza, Bactrim and Azithromycin.  It is CRITICAL that you take your medications. The longer you do not take your medication the more damage that is being done. You remain at EXTREMELY HIGH RISK for opportunistic infection.   Please let us know how we can help.  Plan for follow up in 1 month or sooner if needed.

## 2020-07-23 NOTE — Assessment & Plan Note (Addendum)
   Discussed importance of safe sexual practice to reduce risk of STI. Condoms declined.   Declines vaccinations  Encouraged to complete routine dental care. If unable will refer to [facility] dental clinic.

## 2020-07-23 NOTE — Progress Notes (Signed)
Subjective:    Patient ID: Jacob Gutierrez, male    DOB: [birth year], 34 y.o.   MRN: [id number]  Chief Complaint  Patient presents with   Follow-up    Declined condoms      HPI:  Jacob Gutierrez is a 34 y.o. male with HIV disease who was last seen on 12/15 by Jacob Gutierrez, CPP with less than optimal adherence and tolerance to his ART regimen of Symtuza supplemented with Bactrim for OI prophylaxis. Here today for routine follow up.   Jacob Gutierrez has not been taking his medications daily as prescribed secondary to "life and work getting in the way." Initially has been constipation however described diarrhea. Has been losing weight despite eating. Denies fevers, chills, night sweats, headaches, changes in vision, neck pain/stiffness, nausea, vomiting, lesions or rashes.  Jacob Gutierrez has no problems obtaining medications from the pharmacy. Denies feelings of being down, depressed or hopeless. Smokes marijuana on occasion and tobacco use is 1 pack per day. Declines condoms. Due for booster with J&J or other COVID vaccine.    Allergies  Allergen Reactions   Amoxicillin Other (See Comments)    From childhood: "I had a reaction when i was little." (??)   Vancomycin Itching, Swelling and Other (See Comments)    Angioedema, also      Outpatient Medications Prior to Visit  Medication Sig Dispense Refill   Darunavir-Cobicisctat-Emtricitabine-Tenofovir Alafenamide (SYMTUZA) 800-150-200-10 MG TABS Take 1 tablet by mouth daily with breakfast. 30 tablet 5   azithromycin (ZITHROMAX) 600 MG tablet Take 2 tablets (1,200 mg total) by mouth once a week. (Patient not taking: Reported on 07/23/2020) 10 tablet 5   dicyclomine (BENTYL) 20 MG tablet Take 1 tablet (20 mg total) by mouth 2 (two) times daily. (Patient not taking: Reported on 07/23/2020) 20 tablet 0   doxycycline (VIBRAMYCIN) 100 MG capsule Take 1 capsule (100 mg total) by mouth 2 (two) times daily. (Patient not taking:  Reported on 07/23/2020) 14 capsule 0   sulfamethoxazole-trimethoprim (BACTRIM DS) 800-160 MG tablet Take 1 tablet by mouth daily. (Patient not taking: Reported on 07/23/2020) 30 tablet 5   No facility-administered medications prior to visit.     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 [facility])    H/O coccidioidomycosis    pulmonary    HIV disease (HCC)    Past history of allergy to penicillin-type antibiotic 07-03-2012   desensitization   PNA (pneumonia)    Shigella gastroenteritis    Syphilis    history /treated      Past Surgical History:  Procedure Laterality Date   TRANSURETHRAL RESECTION OF PROSTATE N/A 12/09/2017   Procedure: TRANSURETHRAL RESECTION OF THE PROSTATE (TURP);  Surgeon: Crist Fat, MD;  Location: [facility];  Service: Urology;  Laterality: N/A;    Review of Systems  Constitutional: Negative for appetite change, chills, fatigue, fever and unexpected weight change.  Eyes: Negative for visual disturbance.  Respiratory: Negative for cough, chest tightness, shortness of breath and wheezing.   Cardiovascular: Negative for chest pain and leg swelling.  Gastrointestinal: Positive for diarrhea. Negative for abdominal pain, constipation, nausea and vomiting.  Genitourinary: Negative for dysuria, flank pain, frequency, genital sores, hematuria and urgency.  Skin: Negative for rash.  Allergic/Immunologic: Negative for immunocompromised state.  Neurological: Negative for dizziness and headaches.  Objective:    BP 99/67    Pulse 99    Temp 98.3 F (36.8 C) (Oral)    Ht 6\' 1"  (1.854 m)    Wt 180 lb (81.6 kg)    SpO2 97%    BMI 23.75 kg/m  Nursing note and vital signs reviewed.  Physical Exam Constitutional:      General: He is not in acute distress.    Appearance: He is  well-developed.  HENT:     Mouth/Throat:     Mouth: Oropharynx is clear and moist.  Eyes:     Conjunctiva/sclera: Conjunctivae normal.  Cardiovascular:     Rate and Rhythm: Normal rate and regular rhythm.     Pulses: Intact distal pulses.     Heart sounds: Normal heart sounds. No murmur heard. No friction rub. No gallop.   Pulmonary:     Effort: Pulmonary effort is normal. No respiratory distress.     Breath sounds: Normal breath sounds. No wheezing or rales.  Chest:     Chest wall: No tenderness.  Abdominal:     General: Bowel sounds are normal.     Palpations: Abdomen is soft.     Tenderness: There is no abdominal tenderness.  Musculoskeletal:     Cervical back: Neck supple.  Lymphadenopathy:     Cervical: No cervical adenopathy.  Skin:    General: Skin is warm and dry.     Findings: No rash.  Neurological:     Mental Status: He is alert.  Psychiatric:        Mood and Affect: Mood and affect normal.      Depression screen [facility] 2/9 07/23/2020 10/24/2018 10/04/2018 09/06/2018 04/24/2018  Decreased Interest 0 0 0 0 0  Down, Depressed, Hopeless 0 0 0 0 0  PHQ - 2 Score 0 0 0 0 0       Assessment & Plan:    Patient Active Problem List   Diagnosis Date Noted   Healthcare maintenance 07/23/2020   Pyelonephritis 02/13/2019   Prostatitis 10/05/2018   Leukopenia 09/20/2018   Thrombocytopenia (HCC) 09/20/2018   Hyponatremia 09/20/2018   Avoidance coping 09/07/2018   Headache 04/27/2018   Condyloma 04/24/2018   Protein-calorie malnutrition, severe 12/12/2017   Personal history of MRSA (methicillin resistant Staphylococcus aureus) 12/09/2017   History of ESBL E. coli infection 12/09/2017   AIDS (acquired immune deficiency syndrome) (HCC)    Homelessness    Dysplasia of anus 01/02/2014   Tobacco use disorder 01/02/2014   Human immunodeficiency virus (HIV) disease (HCC) 01/01/2014     Problem List Items Addressed This Visit      Other   AIDS  (acquired immune deficiency syndrome) (HCC) - Primary    Jacob Gutierrez has poorly controlled AIDS with CD4 count <35 secondary to less than optimal adherence to his ART regimen. Fortunately does not appear to have opportunistic infection but remains as significantly high risk. Discussed importance of taking medications to prevent further complications and disease progression. Restarting on Symtuza with Bactrim and azithromycin for OI prophylaxis. Plan for follow up in 1 month or sooner if needed.       Healthcare maintenance     Discussed importance of safe sexual practice to reduce risk of STI. Condoms declined.   Declines vaccinations  Encouraged to complete routine dental care. If unable will refer to [facility] dental clinic.           I am having [facility] maintain his doxycycline, dicyclomine, Symtuza, azithromycin, and  sulfamethoxazole-trimethoprim.   Follow-up: Return in about 1 month (around 08/23/2020), or if symptoms worsen or fail to improve.   Marcos Eke, MSN, FNP-C Gutierrez Practitioner [facility] Main number: [phone]

## 2020-08-06 ENCOUNTER — Encounter (HOSPITAL_COMMUNITY): Payer: Self-pay

## 2020-08-06 ENCOUNTER — Other Ambulatory Visit: Payer: Self-pay

## 2020-08-06 DIAGNOSIS — F17210 Nicotine dependence, cigarettes, uncomplicated: Secondary | ICD-10-CM | POA: Diagnosis not present

## 2020-08-06 DIAGNOSIS — U071 COVID-19: Secondary | ICD-10-CM | POA: Insufficient documentation

## 2020-08-06 DIAGNOSIS — R509 Fever, unspecified: Secondary | ICD-10-CM | POA: Diagnosis present

## 2020-08-06 MED ORDER — IBUPROFEN 800 MG PO TABS
800.0000 mg | ORAL_TABLET | Freq: Once | ORAL | Status: AC
  Administered 2020-08-06: 800 mg via ORAL
  Filled 2020-08-06: qty 1

## 2020-08-06 NOTE — ED Triage Notes (Addendum)
Pt presents via [facility], c/o fever, malaise and body aches x 2 days, took tylenol @ 6pm, hx HIV

## 2020-08-07 ENCOUNTER — Other Ambulatory Visit (HOSPITAL_COMMUNITY): Payer: Self-pay

## 2020-08-07 ENCOUNTER — Emergency Department (HOSPITAL_COMMUNITY)
Admission: EM | Admit: 2020-08-07 | Discharge: 2020-08-07 | Disposition: A | Discharge status: Home / Self Care | Payer: Medicaid Other | Attending: Emergency Medicine | Admitting: Emergency Medicine

## 2020-08-07 ENCOUNTER — Emergency Department (HOSPITAL_COMMUNITY): Payer: Medicaid Other

## 2020-08-07 ENCOUNTER — Other Ambulatory Visit: Payer: Self-pay

## 2020-08-07 DIAGNOSIS — U071 COVID-19: Secondary | ICD-10-CM

## 2020-08-07 LAB — RESP PANEL BY RT-PCR (FLU A&B, COVID) ARPGX2
Influenza A by PCR: NEGATIVE
Influenza B by PCR: NEGATIVE
SARS Coronavirus 2 by RT PCR: POSITIVE — AB

## 2020-08-07 LAB — PROTIME-INR
INR: 1.1 (ref 0.8–1.2)
Prothrombin Time: 13.5 seconds (ref 11.4–15.2)

## 2020-08-07 LAB — COMPREHENSIVE METABOLIC PANEL
ALT: 19 U/L (ref 0–44)
AST: 33 U/L (ref 15–41)
Albumin: 3.2 g/dL — ABNORMAL LOW (ref 3.5–5.0)
Alkaline Phosphatase: 62 U/L (ref 38–126)
Anion gap: 12 (ref 5–15)
BUN: 18 mg/dL (ref 6–20)
CO2: 20 mmol/L — ABNORMAL LOW (ref 22–32)
Calcium: 8.4 mg/dL — ABNORMAL LOW (ref 8.9–10.3)
Chloride: 102 mmol/L (ref 98–111)
Creatinine, Ser: 1.07 mg/dL (ref 0.61–1.24)
GFR, Estimated: >60 mL/min (ref >60)
Glucose, Bld: 81 mg/dL (ref 70–99)
Potassium: 3.4 mmol/L — ABNORMAL LOW (ref 3.5–5.1)
Sodium: 134 mmol/L — ABNORMAL LOW (ref 135–145)
Total Bilirubin: 0.6 mg/dL (ref 0.3–1.2)
Total Protein: 9 g/dL — ABNORMAL HIGH (ref 6.5–8.1)

## 2020-08-07 LAB — CBC WITH DIFFERENTIAL/PLATELET
Abs Immature Granulocytes: 0.5 10*3/uL — ABNORMAL HIGH (ref 0.00–0.07)
Basophils Absolute: 0 10*3/uL (ref 0.0–0.1)
Basophils Relative: 1 %
Eosinophils Absolute: 0.1 10*3/uL (ref 0.0–0.5)
Eosinophils Relative: 2 %
HCT: 36.9 % — ABNORMAL LOW (ref 39.0–52.0)
Hemoglobin: 11.8 g/dL — ABNORMAL LOW (ref 13.0–17.0)
Immature Granulocytes: 10 %
Lymphocytes Relative: 14 %
Lymphs Abs: 0.8 10*3/uL (ref 0.7–4.0)
MCH: 30.1 pg (ref 26.0–34.0)
MCHC: 32 g/dL (ref 30.0–36.0)
MCV: 94.1 fL (ref 80.0–100.0)
Monocytes Absolute: 0.6 10*3/uL (ref 0.1–1.0)
Monocytes Relative: 11 %
Neutro Abs: 3.3 10*3/uL (ref 1.7–7.7)
Neutrophils Relative %: 62 %
Platelets: 194 10*3/uL (ref 150–400)
RBC: 3.92 MIL/uL — ABNORMAL LOW (ref 4.22–5.81)
RDW: 15.1 % (ref 11.5–15.5)
WBC: 5.2 10*3/uL (ref 4.0–10.5)
nRBC: 0 % (ref 0.0–0.2)

## 2020-08-07 LAB — LACTIC ACID, PLASMA: Lactic Acid, Venous: 1 mmol/L (ref 0.5–1.9)

## 2020-08-07 LAB — APTT: aPTT: 21 seconds — ABNORMAL LOW (ref 24–36)

## 2020-08-07 MED ORDER — SODIUM CHLORIDE 0.9 % IV SOLN
200.0000 mg | Freq: Once | INTRAVENOUS | Status: AC
  Administered 2020-08-07: 200 mg via INTRAVENOUS
  Filled 2020-08-07: qty 200

## 2020-08-07 MED ORDER — SODIUM CHLORIDE 0.9 % IV SOLN: 100.0000 mg | Freq: Every day | INTRAVENOUS | Status: DC

## 2020-08-07 NOTE — ED Provider Notes (Signed)
[facility]-EMERGENCY DEPT Provider Note   CSN: [id number] Arrival date & time: 08/06/20  2031     History Chief Complaint  Patient presents with  . Generalized Body Aches    Jacob Gutierrez is a 35 y.o. male.  The history is provided by the patient.  Fever Severity:  Moderate Onset quality:  Gradual Duration:  2 days Timing:  Intermittent Progression:  Worsening Chronicity:  New Relieved by:  Nothing Worsened by:  Nothing Associated symptoms: chills, diarrhea, headaches and nausea   Associated symptoms: no chest pain, no cough, no dysuria and no vomiting   Patient with AIDS presents with fever and headache.  Patient reports for the past 2 days he has had fever, body aches and chills.  Denies any cough or sore throat.  No vomiting but he has had diarrhea. He reports he just recently restarted all of his HIV meds     Past Medical History:  Diagnosis Date  . AIDS (acquired immune deficiency syndrome) (HCC)   . AIDS (acquired immune deficiency syndrome) (HCC)   . 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 H/O coccidioidomycosis    pulmonary   . HIV disease (HCC)   . Past history of allergy to penicillin-type antibiotic 07-03-2012   desensitization  . PNA (pneumonia)   . Shigella gastroenteritis   . Syphilis    history /treated     Patient Active Problem List   Diagnosis Date Noted  . Healthcare maintenance 07/23/2020  . Pyelonephritis 02/13/2019  . Prostatitis 10/05/2018  . Leukopenia 09/20/2018  . Thrombocytopenia (HCC) 09/20/2018  . Hyponatremia 09/20/2018  . 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 Headache 04/27/2018  . Condyloma 04/24/2018  . Protein-calorie malnutrition, severe 12/12/2017  . Personal history of MRSA (methicillin resistant Staphylococcus aureus) 12/09/2017  . History of ESBL E. coli infection 12/09/2017  . AIDS (acquired immune deficiency syndrome) (HCC)   . Homelessness   . Dysplasia of  anus 01/02/2014  . Tobacco use disorder 01/02/2014  . Human immunodeficiency virus (HIV) disease (HCC) 01/01/2014    Past Surgical History:  Procedure Laterality Date  . TRANSURETHRAL RESECTION OF PROSTATE N/A 12/09/2017   Procedure: TRANSURETHRAL RESECTION OF THE PROSTATE (TURP);  Surgeon: Crist Fat, MD;  Location: [facility];  Service: Urology;  Laterality: N/A;       Family History  Problem Relation Age of Onset  . Hypertension Mother   . Lupus Maternal Grandmother   . Cancer Paternal Grandfather     Social History   Tobacco Use  . Smoking status: Current Every Day Smoker    Packs/day: 0.30    Types: Cigarettes  . Smokeless tobacco: Never Used  . Tobacco comment: cutting back  Vaping Use  . Vaping Use: Never used  Substance Use Topics  . Alcohol use: Not Currently    Alcohol/week: 1.0 standard drink    Types: 1 Standard drinks or equivalent per week    Comment: whiskey occasionally   . Drug use: Not Currently    Frequency: 1.0 times per week    Types: Marijuana    Home Medications Prior to Admission medications   Medication Sig Start Date End Date Taking? Authorizing Provider  Darunavir-Cobicisctat-Emtricitabine-Tenofovir Alafenamide (SYMTUZA) 800-150-200-10 MG TABS Take 1 tablet by mouth daily with breakfast. 07/08/20  Yes Kuppelweiser, Cassie L, RPH-CPP  azithromycin (ZITHROMAX) 600 MG tablet Take 2 tablets (1,200 mg total) by mouth once a week. 07/08/20   Kuppelweiser, Cassie L, RPH-CPP  sulfamethoxazole-trimethoprim (  BACTRIM DS) 800-160 MG tablet Take 1 tablet by mouth daily. Patient not taking: No sig reported 07/08/20   Kuppelweiser, Cassie L, RPH-CPP    Allergies    Amoxicillin and Vancomycin  Review of Systems   Review of Systems  Constitutional: Positive for chills and fever.  Respiratory: Negative for cough.   Cardiovascular: Negative for chest pain.  Gastrointestinal: Positive for diarrhea and nausea. Negative for vomiting.  Genitourinary:  Negative for dysuria.  Neurological: Positive for headaches.  All other systems reviewed and are negative.   Physical Exam Updated Vital Signs BP 101/67   Pulse 85   Temp 97.7 F (36.5 C) (Oral)   Resp 16   Ht 1.854 m (6\' 1" )   Wt 81.6 kg   SpO2 100%   BMI 23.75 kg/m   Physical Exam CONSTITUTIONAL: Cachectic HEAD: Normocephalic/atraumatic EYES: EOMI/PERRL ENMT: Mucous membranes moist NECK: supple no meningeal signs SPINE/BACK:entire spine nontender CV: S1/S2 noted, no murmurs/rubs/gallops noted LUNGS: Lungs are clear to auscultation bilaterally, no apparent distress ABDOMEN: soft, nontender, no rebound or guarding, bowel sounds noted throughout abdomen GU:no cva tenderness NEURO: Pt is awake/alert/appropriate, moves all extremitiesx4.  No facial droop.  No arm or leg drift EXTREMITIES: pulses normal/equal, full ROM SKIN: warm, color normal PSYCH: no abnormalities of mood noted, alert and oriented to situation   ED Results / Procedures / Treatments   Labs (all labs ordered are listed, but only abnormal results are displayed) Labs Reviewed  RESP PANEL BY RT-PCR (FLU A&B, COVID) ARPGX2 - Abnormal; Notable for the following components:      Result Value   SARS Coronavirus 2 by RT PCR POSITIVE (*)    All other components within normal limits  COMPREHENSIVE METABOLIC PANEL - Abnormal; Notable for the following components:   Sodium 134 (*)    Potassium 3.4 (*)    CO2 20 (*)    Calcium 8.4 (*)    Total Protein 9.0 (*)    Albumin 3.2 (*)    All other components within normal limits  APTT - Abnormal; Notable for the following components:   aPTT 21 (*)    All other components within normal limits  CBC WITH DIFFERENTIAL/PLATELET - Abnormal; Notable for the following components:   RBC 3.92 (*)    Hemoglobin 11.8 (*)    HCT 36.9 (*)    Abs Immature Granulocytes 0.50 (*)    All other components within normal limits  LACTIC ACID, PLASMA  PROTIME-INR  CBC WITH  DIFFERENTIAL/PLATELET    EKG None  Radiology DG Chest Port 1 View  Result Date: 08/07/2020 CLINICAL DATA:  Fever and body aches EXAM: PORTABLE CHEST 1 VIEW COMPARISON:  04/15/2019 FINDINGS: The heart size and mediastinal contours are within normal limits. Both lungs are clear. The visualized skeletal structures are unremarkable. IMPRESSION: No active disease. Electronically Signed   By: 04/17/2019 M.D.   On: 08/07/2020 03:18    Procedures Procedures Medications Ordered in ED Medications  remdesivir 200 mg in sodium chloride 0.9% 250 mL IVPB (200 mg Intravenous New Bag/Given 08/07/20 0604)    Followed by  remdesivir 100 mg in sodium chloride 0.9 % 100 mL IVPB (has no administration in time range)  ibuprofen (ADVIL) tablet 800 mg (800 mg Oral Given 08/06/20 2047)    ED Course  I have reviewed the triage vital signs and the nursing notes.  Pertinent labs & imaging results that were available during my care of the patient were reviewed by me and considered in  my medical decision making (see chart for details).    MDM Rules/Calculators/A&P                           6:35 AM Patient with poorly controlled AIDS presents with fever and headache.  Patient felt to be positive for COVID-19.  He is vaccinated with the [employer] vaccine.  He is in no acute distress and denies any shortness of breath.  There has been no hypoxia while in the emergency department.  Labs overall reassuring. At this time there is no indication for admission.  Due to his comorbidities, remdesivir infusions would likely be helpful. Patient is agreeable with this plan. We discussed strict return precautions Patient otherwise in no distress, no meningeal signs, well-appearing   Jacob Gutierrez was evaluated in Emergency Department on 08/07/2020 for the symptoms described in the history of present illness. He was evaluated in the context of the global COVID-19 pandemic, which necessitated consideration that  the patient might be at risk for infection with the SARS-CoV-2 virus that causes COVID-19. Institutional protocols and algorithms that pertain to the evaluation of patients at risk for COVID-19 are in a state of rapid change based on information released by regulatory bodies including the CDC and federal and state organizations. These policies and algorithms were followed during the patient's care in the ED.  Final Clinical Impression(s) / ED Diagnoses Final diagnoses:  COVID-19    Rx / DC Orders ED Discharge Orders    None       Zadie Rhine, MD 08/07/20 [phone]

## 2020-08-07 NOTE — ED Notes (Signed)
Date and time results received: 08/07/20 0355 (use smartphrase ".now" to insert current time)  Test: covid Critical Value: Positive  Name of Provider Notified: Wickline MD  Orders Received? Or Actions Taken?:No new orders

## 2020-08-07 NOTE — ED Notes (Signed)
Pt. Made aware for the need of urine specimen. 

## 2020-08-07 NOTE — Discharge Instructions (Signed)
You can end isolation after 5 days if you are fever free for 24 hours and your symptoms are improving.  Wear a mask at home and in public for additional 5 days.   If you continue to have fever, you should wait to end your isolation until you are fever free for 24 hours and you overall improving.  For members of your household or close contacts, they should quarantine if they have not been vaccinated or unable to be vaccinated.  They should wear a mask when around others.  They can be tested at the 5 day mark after last close contact with someone with COVID-19    

## 2020-08-08 ENCOUNTER — Telehealth (HOSPITAL_COMMUNITY): Payer: Self-pay

## 2020-08-08 NOTE — Telephone Encounter (Signed)
Called to schedule patient for dose #2 of remdesivir. Patient unavailable. Will try again later. If unable to receive today, he will not be a candidate for further infusions.   Jacob Gutierrez Loyola Mast, RN

## 2020-08-08 NOTE — Telephone Encounter (Signed)
Second attempt made to reach patient. Name on voicemail does not match patient name.   Lianni Kanaan Loyola Mast, RN

## 2020-08-21 MED FILL — SYMTUZA 800-150-200-10 MG T: 800-150-200 | 30 days supply | Qty: 30 | Fill #1

## 2020-08-21 MED FILL — SULFAMETHOXAZOLE-TMP DS TAB: 800-160 | 30 days supply | Qty: 30 | Fill #1

## 2020-08-24 ENCOUNTER — Telehealth: Payer: Self-pay

## 2020-08-24 NOTE — Telephone Encounter (Signed)
[facility] Patient Advocate Encounter  Patient's medications (Symtuza & Sulfa) have been couriered to [facility] from [facility] and will be picked up on 08/25/20 on his next appointment.  Clearance Coots , CPhT Specialty Pharmacy Patient [facility] for Infectious Disease Phone: [phone] Fax:  [phone]

## 2020-08-25 ENCOUNTER — Other Ambulatory Visit: Payer: Self-pay

## 2020-08-25 ENCOUNTER — Ambulatory Visit (INDEPENDENT_AMBULATORY_CARE_PROVIDER_SITE_OTHER): Payer: Medicaid Other | Admitting: Family

## 2020-08-25 ENCOUNTER — Encounter: Payer: Self-pay | Admitting: Family

## 2020-08-25 VITALS — BP 92/60 | HR 116 | Temp 101.6°F | Wt 167.0 lb

## 2020-08-25 DIAGNOSIS — U071 COVID-19: Secondary | ICD-10-CM

## 2020-08-25 DIAGNOSIS — B342 Coronavirus infection, unspecified: Secondary | ICD-10-CM | POA: Insufficient documentation

## 2020-08-25 DIAGNOSIS — R82998 Other abnormal findings in urine: Secondary | ICD-10-CM | POA: Diagnosis not present

## 2020-08-25 DIAGNOSIS — B20 Human immunodeficiency virus [HIV] disease: Secondary | ICD-10-CM

## 2020-08-25 NOTE — Progress Notes (Signed)
Subjective:    Patient ID: KSEAN VALE, male    DOB: 1986-03-02, 35 y.o.   MRN: [id number]  Chief Complaint  Patient presents with  . Follow-up     HPI:  MELBOURNE JAKUBIAK is a 35 y.o. male with HIV with HIV/AIDS last seen on 07/23/2020 with poorly controlled virus and less than optimal adherence to his ART regimen of Symtuza with viral load of 733,000 and CD4 count less than 35.  He was restarted on Symtuza supplemented with azithromycin and Bactrim for OI prophylaxis.  Subsequently developed COVID-19 infection and was seen in the ED on 08/07/2020 and given 1 dose of remdesivir.  Unfortunately he was lost to follow-up and did not complete treatment with remdesivir.  No recent blood work.  Here today for follow-up.  Mr. Paskett has been taking his Symtuza and Bactrim as prescribed with a couple of missed doses since his last office visit.  Continues to feel poorly following most recent Covid diagnosis and continues to have fevers, flank pain, fatigue, and malaise.  Also experiencing heartburn which has been refractory to Prilosec.  Denies coughing or shortness of breath. Has also been having dark urine with bilateral flank pain. Denies urinary frequency, urgency, or dysuria. Denies night sweats, headaches, changes in vision, neck pain/stiffness, nausea, diarrhea, vomiting, lesions or rashes.  Mr. Minus will be receiving his medication today from the pharmacy staff. Denies feelings of being down, depressed or hopeless recently. No current recreational or illicit drug use, tobacco use or alcohol consumption since being ill. Declines condoms.   Allergies  Allergen Reactions  . Amoxicillin Other (See Comments)    From childhood: "I had a reaction when i was little." (??)  . Vancomycin Itching, Swelling and Other (See Comments)    Angioedema, also      Outpatient Medications Prior to Visit  Medication Sig Dispense Refill  . Darunavir-Cobicisctat-Emtricitabine-Tenofovir Alafenamide  (SYMTUZA) 800-150-200-10 MG TABS Take 1 tablet by mouth daily with breakfast. 30 tablet 5  . sulfamethoxazole-trimethoprim (BACTRIM DS) 800-160 MG tablet Take 1 tablet by mouth daily. 30 tablet 5  . azithromycin (ZITHROMAX) 600 MG tablet Take 2 tablets (1,200 mg total) by mouth once a week. (Patient not taking: Reported on 08/25/2020) 10 tablet 5   No facility-administered medications prior to visit.     Past Medical History:  Diagnosis Date  . AIDS (acquired immune deficiency syndrome) (HCC)   . AIDS (acquired immune deficiency syndrome) (HCC)   . 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 H/O coccidioidomycosis    pulmonary   . HIV disease (HCC)   . Past history of allergy to penicillin-type antibiotic 07-03-2012   desensitization  . PNA (pneumonia)   . Shigella gastroenteritis   . Syphilis    history /treated      Past Surgical History:  Procedure Laterality Date  . TRANSURETHRAL RESECTION OF PROSTATE N/A 12/09/2017   Procedure: TRANSURETHRAL RESECTION OF THE PROSTATE (TURP);  Surgeon: Crist Fat, MD;  Location: [facility];  Service: Urology;  Laterality: N/A;    Review of Systems  Constitutional: Negative for appetite change, chills, fatigue, fever and unexpected weight change.  Eyes: Negative for visual disturbance.  Respiratory: Negative for cough, chest tightness, shortness of breath and wheezing.   Cardiovascular: Negative for chest pain and leg swelling.  Gastrointestinal: Negative for abdominal pain, constipation, diarrhea, nausea and vomiting.  Genitourinary: Negative for dysuria, flank pain, frequency, genital sores, hematuria and urgency.  Skin: Negative  for rash.  Allergic/Immunologic: Negative for immunocompromised state.  Neurological: Negative for dizziness and headaches.      Objective:    BP 92/60   Pulse (!) 116   Temp (!) 101.6 F (38.7 C) (Oral)   Wt 167 lb (75.8 kg)   BMI 22.03 kg/m  Nursing note and vital  signs reviewed.  Physical Exam Constitutional:      General: He is not in acute distress.    Appearance: He is well-developed. He is ill-appearing.  HENT:     Mouth/Throat:     Mouth: Oropharynx is clear and moist.  Eyes:     Conjunctiva/sclera: Conjunctivae normal.  Cardiovascular:     Rate and Rhythm: Normal rate and regular rhythm.     Pulses: Intact distal pulses.     Heart sounds: Normal heart sounds. No murmur heard. No friction rub. No gallop.   Pulmonary:     Effort: Pulmonary effort is normal. No respiratory distress.     Breath sounds: Normal breath sounds. No wheezing or rales.  Chest:     Chest wall: No tenderness.  Abdominal:     General: Bowel sounds are normal.     Palpations: Abdomen is soft.     Tenderness: There is no abdominal tenderness.  Musculoskeletal:     Cervical back: Neck supple.  Lymphadenopathy:     Cervical: No cervical adenopathy.  Skin:    General: Skin is warm and dry.     Findings: No rash.  Neurological:     Mental Status: He is alert and oriented to person, place, and time.  Psychiatric:        Mood and Affect: Mood and affect normal.      Depression screen [facility] 2/9 07/23/2020 10/24/2018 10/04/2018 09/06/2018 04/24/2018  Decreased Interest 0 0 0 0 0  Down, Depressed, Hopeless 0 0 0 0 0  PHQ - 2 Score 0 0 0 0 0       Assessment & Plan:    Patient Active Problem List   Diagnosis Date Noted  . COVID-19 virus infection 08/25/2020  . Dark urine 08/25/2020  . Healthcare maintenance 07/23/2020  . Pyelonephritis 02/13/2019  . Prostatitis 10/05/2018  . Leukopenia 09/20/2018  . Thrombocytopenia (HCC) 09/20/2018  . Hyponatremia 09/20/2018  . 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 Headache 04/27/2018  . Condyloma 04/24/2018  . Protein-calorie malnutrition, severe 12/12/2017  . Personal history of MRSA (methicillin resistant Staphylococcus aureus) 12/09/2017  . History of ESBL E. coli infection 12/09/2017  . AIDS (acquired immune deficiency  syndrome) (HCC)   . Homelessness   . 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 Tobacco use disorder 01/02/2014  . Human immunodeficiency virus (HIV) disease (HCC) 01/01/2014     Problem List Items Addressed This Visit      Other   AIDS (acquired immune deficiency syndrome) (HCC) - Primary    Mr. Lengacher appears to have improved medication adherence with good tolerance. Given continued fevers will check cryptococcal antigen and CMV although likely sequale of COVID. Discussed importance of taking medication daily as prescribed. Check lab work today. Continue current dose of Symtuza and Bactrim. Medications provided. Plan for follow up in 1 month or sooner if needed.       Relevant Orders   COMPLETE METABOLIC PANEL WITH GFR   Urinalysis, Routine w reflex microscopic   CMV (cytomegalovirus) dna ultraquant,pcr   T-helper cell (CD4)- ([facility] clinic only)   HIV-1 RNA quant-no reflex-bld   CBC w/Diff   Cryptococcal Ag, Ltx  Scr Rflx Titer   COVID-19 virus infection    Mr. Mckeone was diagnosed with COVID 19 on 1/14 and received 1 dose of remdesivir. He has one vaccination with Laural Benes and [employer]. No apparent evidence of pneumonia at present based on history and physical exam. No calf pain. Continues to have fevers which are likely related to COVID and his immune suppressed status. Check lab work today. Will refer to [facility] and advised to seek emergent care if symptoms worsen or do not improve.       Relevant Orders   Urinalysis, Routine w reflex microscopic   Urine Culture   Dark urine    Mr. Pitre has acute onset dark urine with mild flank pain and no other urinary symptoms. Check UA. Encouraged to drink plenty of fluids. Additional treatment as needed pending lab work results.       Relevant Orders   Urinalysis, Routine w reflex microscopic   Urine Culture       I am having Lillie Columbia maintain his Symtuza, azithromycin, and  sulfamethoxazole-trimethoprim.   Follow-up: Return in about 1 month (around 09/22/2020), or if symptoms worsen or fail to improve.   Marcos Eke, MSN, FNP-C Nurse Practitioner [facility] for Infectious Disease [facility] Main number: [phone]

## 2020-08-25 NOTE — Patient Instructions (Addendum)
Nice to see you.  We will check your lab work today.  Continue to take you Symtuza and Bactrim daily.  Continue to drink plenty of water and stay hydrated.   Continue to isolate until you do not have fever for >24 hours without Tylenol or other anti-pyretic medications.  If your symptoms do not improve over the next few days you may arrange an appointment in the [facility].  If you develop shortness of breath, continued fevers or symptoms progressively worsen recommend being seen in the ED.   Plan for follow up in 1 month or sooner if needed.

## 2020-08-25 NOTE — Assessment & Plan Note (Signed)
Jacob Gutierrez was diagnosed with COVID 19 on 1/14 and received 1 dose of remdesivir. He has one vaccination with Laural Benes and [employer]. No apparent evidence of pneumonia at present based on history and physical exam. No calf pain. Continues to have fevers which are likely related to COVID and his immune suppressed status. Check lab work today. Will refer to [facility] and advised to seek emergent care if symptoms worsen or do not improve.

## 2020-08-25 NOTE — Assessment & Plan Note (Signed)
Mr. Vavra appears to have improved medication adherence with good tolerance. Given continued fevers will check cryptococcal antigen and CMV although likely sequale of COVID. Discussed importance of taking medication daily as prescribed. Check lab work today. Continue current dose of Symtuza and Bactrim. Medications provided. Plan for follow up in 1 month or sooner if needed.

## 2020-08-25 NOTE — Assessment & Plan Note (Signed)
Jacob Gutierrez has acute onset dark urine with mild flank pain and no other urinary symptoms. Check UA. Encouraged to drink plenty of fluids. Additional treatment as needed pending lab work results.

## 2020-08-26 LAB — T-HELPER CELL (CD4) - (RCID CLINIC ONLY)
CD4 % Helper T Cell: 1 % — ABNORMAL LOW (ref 33–65)
CD4 T Cell Abs: <35 /uL — ABNORMAL LOW (ref 400–1790)

## 2020-08-27 ENCOUNTER — Other Ambulatory Visit: Payer: Self-pay | Admitting: Family

## 2020-08-27 LAB — URINALYSIS, ROUTINE W REFLEX MICROSCOPIC
Bilirubin Urine: NEGATIVE
Glucose, UA: NEGATIVE
Hyaline Cast: NONE SEEN /LPF
Ketones, ur: NEGATIVE
Nitrite: POSITIVE — AB
Specific Gravity, Urine: 1.014 (ref 1.001–1.03)
pH: 6.5 (ref 5.0–8.0)

## 2020-08-27 LAB — CRYPTOCOCCAL AG, LTX SCR RFLX TITER
Cryptococcal Ag Screen: NOT DETECTED
MICRO NUMBER:: 11481109
SPECIMEN QUALITY:: ADEQUATE

## 2020-08-27 LAB — URINE CULTURE
MICRO NUMBER:: 11482650
SPECIMEN QUALITY:: ADEQUATE

## 2020-08-27 MED ORDER — NITROFURANTOIN MONOHYD MACRO 100 MG PO CAPS: 100.0000 mg | ORAL_CAPSULE | Freq: Two times a day (BID) | ORAL | 0 refills | Status: DC

## 2020-08-27 MED FILL — NITROFURANTOIN MONO-MCR 100: 100 | 7 days supply | Qty: 14 | Fill #0

## 2020-08-27 NOTE — Progress Notes (Signed)
Attempted to reach patient via phone. Will send patient a Mychart message and follow up tomorrow.

## 2020-08-28 ENCOUNTER — Telehealth: Payer: Self-pay | Admitting: *Deleted

## 2020-08-28 LAB — CBC WITH DIFFERENTIAL/PLATELET
Absolute Monocytes: 410 cells/uL (ref 200–950)
Basophils Absolute: 62 cells/uL (ref 0–200)
Basophils Relative: 1.5 %
Eosinophils Absolute: 62 cells/uL (ref 15–500)
Eosinophils Relative: 1.5 %
HCT: 35.6 % — ABNORMAL LOW (ref 38.5–50.0)
Hemoglobin: 11.7 g/dL — ABNORMAL LOW (ref 13.2–17.1)
Lymphs Abs: 521 cells/uL — ABNORMAL LOW (ref 850–3900)
MCH: 30 pg (ref 27.0–33.0)
MCHC: 32.9 g/dL (ref 32.0–36.0)
MCV: 91.3 fL (ref 80.0–100.0)
MPV: 9.9 fL (ref 7.5–12.5)
Monocytes Relative: 10 %
Neutro Abs: 3046 cells/uL (ref 1500–7800)
Neutrophils Relative %: 74.3 %
Platelets: 242 10*3/uL (ref 140–400)
RBC: 3.9 10*6/uL — ABNORMAL LOW (ref 4.20–5.80)
RDW: 14.3 % (ref 11.0–15.0)
Total Lymphocyte: 12.7 %
WBC: 4.1 10*3/uL (ref 3.8–10.8)

## 2020-08-28 LAB — COMPLETE METABOLIC PANEL WITH GFR
AG Ratio: 0.6 (calc) — ABNORMAL LOW (ref 1.0–2.5)
ALT: 13 U/L (ref 9–46)
AST: 23 U/L (ref 10–40)
Albumin: 3.3 g/dL — ABNORMAL LOW (ref 3.6–5.1)
Alkaline phosphatase (APISO): 55 U/L (ref 36–130)
BUN: 15 mg/dL (ref 7–25)
CO2: 25 mmol/L (ref 20–32)
Calcium: 8.7 mg/dL (ref 8.6–10.3)
Chloride: 101 mmol/L (ref 98–110)
Creat: 1.05 mg/dL (ref 0.60–1.35)
GFR, Est African American: 105 mL/min/{1.73_m2} (ref >=60)
GFR, Est Non African American: 90 mL/min/{1.73_m2} (ref >=60)
Globulin: 5.3 g/dL (calc) — ABNORMAL HIGH (ref 1.9–3.7)
Glucose, Bld: 84 mg/dL (ref 65–99)
Potassium: 3.8 mmol/L (ref 3.5–5.3)
Sodium: 135 mmol/L (ref 135–146)
Total Bilirubin: 0.4 mg/dL (ref 0.2–1.2)
Total Protein: 8.6 g/dL — ABNORMAL HIGH (ref 6.1–8.1)

## 2020-08-28 LAB — HIV-1 RNA QUANT-NO REFLEX-BLD
HIV 1 RNA Quant: 449000 Copies/mL — ABNORMAL HIGH
HIV-1 RNA Quant, Log: 5.65 Log cps/mL — ABNORMAL HIGH

## 2020-08-28 LAB — CMV (CYTOMEGALOVIRUS) DNA ULTRAQUANT, PCR
CMV DNA, QN PCR: NOT DETECTED log IU/mL
CMV DNA, QN Real Time PCR: NOT DETECTED IU/mL

## 2020-08-28 NOTE — Telephone Encounter (Signed)
-----   Message from Veryl Speak, FNP sent at 08/27/2020  4:33 PM EST ----- Please inform Mr. Collymore I have sent in a prescription for Macrobid for him to take with concern for possible urinary tract infection. They can mail it to him if needed.

## 2020-08-28 NOTE — Telephone Encounter (Signed)
Relayed to patient. He will pick up the prescription macrobid.  He asked about the 2nd dose of his covid infusion, stating he is having fevers 99-102, has had nausea with vomiting and diarrhea for the last 3 nights. He is starting to feel better today. Please advise. Andree Coss, RN

## 2020-09-02 ENCOUNTER — Encounter (HOSPITAL_COMMUNITY): Payer: Self-pay | Admitting: Emergency Medicine

## 2020-09-02 ENCOUNTER — Other Ambulatory Visit: Payer: Self-pay

## 2020-09-02 ENCOUNTER — Other Ambulatory Visit: Payer: Self-pay | Admitting: Physician Assistant

## 2020-09-02 ENCOUNTER — Emergency Department (HOSPITAL_COMMUNITY)
Admission: EM | Admit: 2020-09-02 | Discharge: 2020-09-02 | Disposition: A | Discharge status: Home / Self Care | Payer: Medicaid Other | Attending: Emergency Medicine | Admitting: Emergency Medicine

## 2020-09-02 DIAGNOSIS — R197 Diarrhea, unspecified: Secondary | ICD-10-CM | POA: Insufficient documentation

## 2020-09-02 DIAGNOSIS — K644 Residual hemorrhoidal skin tags: Secondary | ICD-10-CM | POA: Diagnosis not present

## 2020-09-02 DIAGNOSIS — R319 Hematuria, unspecified: Secondary | ICD-10-CM | POA: Diagnosis not present

## 2020-09-02 DIAGNOSIS — R066 Hiccough: Secondary | ICD-10-CM | POA: Insufficient documentation

## 2020-09-02 DIAGNOSIS — E871 Hypo-osmolality and hyponatremia: Secondary | ICD-10-CM | POA: Insufficient documentation

## 2020-09-02 DIAGNOSIS — E876 Hypokalemia: Secondary | ICD-10-CM | POA: Diagnosis not present

## 2020-09-02 DIAGNOSIS — B20 Human immunodeficiency virus [HIV] disease: Secondary | ICD-10-CM | POA: Insufficient documentation

## 2020-09-02 DIAGNOSIS — F17210 Nicotine dependence, cigarettes, uncomplicated: Secondary | ICD-10-CM | POA: Insufficient documentation

## 2020-09-02 DIAGNOSIS — Z8616 Personal history of COVID-19: Secondary | ICD-10-CM | POA: Diagnosis not present

## 2020-09-02 DIAGNOSIS — R112 Nausea with vomiting, unspecified: Secondary | ICD-10-CM | POA: Insufficient documentation

## 2020-09-02 DIAGNOSIS — K921 Melena: Secondary | ICD-10-CM | POA: Insufficient documentation

## 2020-09-02 DIAGNOSIS — R195 Other fecal abnormalities: Secondary | ICD-10-CM

## 2020-09-02 LAB — URINALYSIS, ROUTINE W REFLEX MICROSCOPIC
Bilirubin Urine: NEGATIVE
Glucose, UA: NEGATIVE mg/dL
Ketones, ur: NEGATIVE mg/dL
Nitrite: NEGATIVE
Protein, ur: NEGATIVE mg/dL
Specific Gravity, Urine: <1.005 — ABNORMAL LOW (ref 1.005–1.030)
pH: 6.5 (ref 5.0–8.0)

## 2020-09-02 LAB — URINALYSIS, MICROSCOPIC (REFLEX)

## 2020-09-02 LAB — COMPREHENSIVE METABOLIC PANEL
ALT: 20 U/L (ref 0–44)
AST: 28 U/L (ref 15–41)
Albumin: 2.6 g/dL — ABNORMAL LOW (ref 3.5–5.0)
Alkaline Phosphatase: 49 U/L (ref 38–126)
Anion gap: 12 (ref 5–15)
BUN: 13 mg/dL (ref 6–20)
CO2: 21 mmol/L — ABNORMAL LOW (ref 22–32)
Calcium: 8.5 mg/dL — ABNORMAL LOW (ref 8.9–10.3)
Chloride: 101 mmol/L (ref 98–111)
Creatinine, Ser: 1.09 mg/dL (ref 0.61–1.24)
GFR, Estimated: >60 mL/min (ref >60)
Glucose, Bld: 111 mg/dL — ABNORMAL HIGH (ref 70–99)
Potassium: 2.7 mmol/L — CL (ref 3.5–5.1)
Sodium: 134 mmol/L — ABNORMAL LOW (ref 135–145)
Total Bilirubin: 0.6 mg/dL (ref 0.3–1.2)
Total Protein: 8.6 g/dL — ABNORMAL HIGH (ref 6.5–8.1)

## 2020-09-02 LAB — CBC
HCT: 31.1 % — ABNORMAL LOW (ref 39.0–52.0)
HCT: 30.7 % — ABNORMAL LOW (ref 39.0–52.0)
Hemoglobin: 10 g/dL — ABNORMAL LOW (ref 13.0–17.0)
Hemoglobin: 9.7 g/dL — ABNORMAL LOW (ref 13.0–17.0)
MCH: 29.7 pg (ref 26.0–34.0)
MCH: 29.2 pg (ref 26.0–34.0)
MCHC: 32.2 g/dL (ref 30.0–36.0)
MCHC: 31.6 g/dL (ref 30.0–36.0)
MCV: 92.3 fL (ref 80.0–100.0)
MCV: 92.5 fL (ref 80.0–100.0)
Platelets: 342 10*3/uL (ref 150–400)
Platelets: 292 10*3/uL (ref 150–400)
RBC: 3.37 MIL/uL — ABNORMAL LOW (ref 4.22–5.81)
RBC: 3.32 MIL/uL — ABNORMAL LOW (ref 4.22–5.81)
RDW: 15 % (ref 11.5–15.5)
RDW: 15.2 % (ref 11.5–15.5)
WBC: 4.2 10*3/uL (ref 4.0–10.5)
WBC: 4.2 10*3/uL (ref 4.0–10.5)
nRBC: 0 % (ref 0.0–0.2)
nRBC: 0 % (ref 0.0–0.2)

## 2020-09-02 LAB — MAGNESIUM: Magnesium: 2.1 mg/dL (ref 1.7–2.4)

## 2020-09-02 LAB — POC OCCULT BLOOD, ED: Fecal Occult Bld: POSITIVE — AB

## 2020-09-02 LAB — LIPASE, BLOOD: Lipase: 52 U/L — ABNORMAL HIGH (ref 11–51)

## 2020-09-02 MED ORDER — POTASSIUM CHLORIDE 10 MEQ/100ML IV SOLN
10.0000 meq | Freq: Once | INTRAVENOUS | Status: AC
  Administered 2020-09-02: 10 meq via INTRAVENOUS
  Filled 2020-09-02: qty 100

## 2020-09-02 MED ORDER — METOCLOPRAMIDE HCL 10 MG PO TABS: 10.0000 mg | ORAL_TABLET | Freq: Four times a day (QID) | ORAL | 0 refills | Status: DC

## 2020-09-02 MED ORDER — SODIUM CHLORIDE 0.9 % IV BOLUS
1000.0000 mL | Freq: Once | INTRAVENOUS | Status: AC
  Administered 2020-09-02: 1000 mL via INTRAVENOUS

## 2020-09-02 MED ORDER — ALUM & MAG HYDROXIDE-SIMETH 200-200-20 MG/5ML PO SUSP
30.0000 mL | Freq: Once | ORAL | Status: AC
  Administered 2020-09-02: 30 mL via ORAL
  Filled 2020-09-02: qty 30

## 2020-09-02 MED ORDER — POTASSIUM CHLORIDE CRYS ER 20 MEQ PO TBCR
40.0000 meq | EXTENDED_RELEASE_TABLET | Freq: Once | ORAL | Status: AC
  Administered 2020-09-02: 40 meq via ORAL
  Filled 2020-09-02: qty 2

## 2020-09-02 MED ORDER — METOCLOPRAMIDE HCL 5 MG/ML IJ SOLN
10.0000 mg | Freq: Once | INTRAMUSCULAR | Status: AC
  Administered 2020-09-02: 10 mg via INTRAVENOUS
  Filled 2020-09-02: qty 2

## 2020-09-02 MED ORDER — POTASSIUM CHLORIDE CRYS ER 20 MEQ PO TBCR: 20.0000 meq | EXTENDED_RELEASE_TABLET | Freq: Two times a day (BID) | ORAL | 0 refills | Status: DC

## 2020-09-02 MED ORDER — ONDANSETRON HCL 4 MG/2ML IJ SOLN
4.0000 mg | Freq: Once | INTRAMUSCULAR | Status: AC
  Administered 2020-09-02: 4 mg via INTRAVENOUS
  Filled 2020-09-02: qty 2

## 2020-09-02 MED ORDER — LIDOCAINE VISCOUS HCL 2 % MT SOLN
15.0000 mL | Freq: Once | OROMUCOSAL | Status: AC
  Administered 2020-09-02: 15 mL via ORAL
  Filled 2020-09-02: qty 15

## 2020-09-02 NOTE — ED Provider Notes (Addendum)
[facility] EMERGENCY DEPARTMENT Provider Note   CSN: [id number] Arrival date & time: 09/02/20  1516     History Chief Complaint  Patient presents with  . Hiccups    Jacob Gutierrez is a 35 y.o. male.  HPI 35 year old male with an extensive medical history including AIDS, compliant with his HIV medications, Shigella gastroenteritis, syphilis, coccidiomycosis, anal dysplasia presents to the ER with complaints of hiccups x5 days, nausea, intermittent vomiting and bloody diarrhea has now become more watery which has been ongoing for the last week.  He states the hiccups started about 5 days ago.  Feels as though gets worse when he drinks water.  He has tried holding his breath, taking a spoonful of sugar and peanut butter with little relief.  He has never had this happen before.  He denies any abdominal pain, dysuria.  He does not follow with GI.  Denies any fevers or chills.  No flank tenderness.    Past Medical History:  Diagnosis Date  . AIDS (acquired immune deficiency syndrome) (HCC)   . AIDS (acquired immune deficiency syndrome) (HCC)   . 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 H/O coccidioidomycosis    pulmonary   . HIV disease (HCC)   . Past history of allergy to penicillin-type antibiotic 07-03-2012   desensitization  . PNA (pneumonia)   . Shigella gastroenteritis   . Syphilis    history /treated     Patient Active Problem List   Diagnosis Date Noted  . COVID-19 virus infection 08/25/2020  . Dark urine 08/25/2020  . Healthcare maintenance 07/23/2020  . Pyelonephritis 02/13/2019  . Prostatitis 10/05/2018  . Leukopenia 09/20/2018  . Thrombocytopenia (HCC) 09/20/2018  . Hyponatremia 09/20/2018  . 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 Headache 04/27/2018  . Condyloma 04/24/2018  . Protein-calorie malnutrition, severe 12/12/2017  . Personal history of MRSA (methicillin resistant Staphylococcus aureus) 12/09/2017  .  History of ESBL E. coli infection 12/09/2017  . AIDS (acquired immune deficiency syndrome) (HCC)   . Homelessness   . 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 Tobacco use disorder 01/02/2014  . Human immunodeficiency virus (HIV) disease (HCC) 01/01/2014    Past Surgical History:  Procedure Laterality Date  . TRANSURETHRAL RESECTION OF PROSTATE N/A 12/09/2017   Procedure: TRANSURETHRAL RESECTION OF THE PROSTATE (TURP);  Surgeon: Crist Fat, MD;  Location: [facility];  Service: Urology;  Laterality: N/A;       Family History  Problem Relation Age of Onset  . Hypertension Mother   . Lupus Maternal Grandmother   . Cancer Paternal Grandfather     Social History   Tobacco Use  . Smoking status: Current Every Day Smoker    Packs/day: 0.30    Types: Cigarettes  . Smokeless tobacco: Never Used  . Tobacco comment: cutting back  Vaping Use  . Vaping Use: Never used  Substance Use Topics  . Alcohol use: Not Currently    Alcohol/week: 1.0 standard drink    Types: 1 Standard drinks or equivalent per week    Comment: whiskey occasionally   . Drug use: Not Currently    Frequency: 1.0 times per week    Types: Marijuana    Home Medications Prior to Admission medications   Medication Sig Start Date End Date Taking? Authorizing Provider  metoCLOPramide (REGLAN) 10 MG tablet Take 1 tablet (10 mg total) by mouth every 6 (six) hours. 09/02/20  Yes Trudee Grip A, PA-C  potassium chloride SA (  KLOR-CON) 20 MEQ tablet Take 1 tablet (20 mEq total) by mouth 2 (two) times daily. 09/02/20  Yes Mare Ferrari, PA-C  azithromycin (ZITHROMAX) 600 MG tablet Take 2 tablets (1,200 mg total) by mouth once a week. Patient not taking: Reported on 08/25/2020 07/08/20   Kuppelweiser, Cassie L, RPH-CPP  Darunavir-Cobicisctat-Emtricitabine-Tenofovir Alafenamide (SYMTUZA) 800-150-200-10 MG TABS Take 1 tablet by mouth daily with breakfast. 07/08/20   Kuppelweiser, Cassie L, RPH-CPP  nitrofurantoin,  macrocrystal-monohydrate, (MACROBID) 100 MG capsule Take 1 capsule (100 mg total) by mouth 2 (two) times daily for 7 days. 08/27/20 09/03/20  Veryl Speak, FNP  sulfamethoxazole-trimethoprim (BACTRIM DS) 800-160 MG tablet Take 1 tablet by mouth daily. 07/08/20   Kuppelweiser, Cassie L, RPH-CPP    Allergies    Amoxicillin and Vancomycin  Review of Systems   Review of Systems  Constitutional: Negative for chills and fever.  HENT: Negative for ear pain and sore throat.   Eyes: Negative for pain and visual disturbance.  Respiratory: Negative for cough and shortness of breath.   Cardiovascular: Negative for chest pain and palpitations.  Gastrointestinal: Positive for diarrhea, nausea and vomiting. Negative for abdominal pain.  Genitourinary: Negative for dysuria and hematuria.  Musculoskeletal: Negative for arthralgias and back pain.  Skin: Negative for color change and rash.  Neurological: Negative for seizures and syncope.  All other systems reviewed and are negative.   Physical Exam Updated Vital Signs BP (!) 94/56 (BP Location: Left Arm)   Pulse 96   Temp 98.7 F (37.1 C) (Oral)   Resp 16   SpO2 99%   Physical Exam Vitals and nursing note reviewed.  Constitutional:      Appearance: Normal appearance. He is well-developed and well-nourished.  HENT:     Head: Normocephalic and atraumatic.  Eyes:     Conjunctiva/sclera: Conjunctivae normal.  Cardiovascular:     Rate and Rhythm: Normal rate and regular rhythm.     Heart sounds: No murmur heard.   Pulmonary:     Effort: Pulmonary effort is normal. No respiratory distress.     Breath sounds: Normal breath sounds.  Abdominal:     Palpations: Abdomen is soft.     Tenderness: There is no abdominal tenderness.  Genitourinary:    Rectum: Guaiac result positive.     Comments: Rectal exam performed with chaperone present.  Small external hemorrhoids noted, anal sphincter hardened.  No gross blood on exam. Musculoskeletal:         General: No edema. Normal range of motion.     Cervical back: Neck supple.  Skin:    General: Skin is warm and dry.  Neurological:     General: No focal deficit present.     Mental Status: He is alert and oriented to person, place, and time.  Psychiatric:        Mood and Affect: Mood and affect and mood normal.        Behavior: Behavior normal.     ED Results / Procedures / Treatments   Labs (all labs ordered are listed, but only abnormal results are displayed) Labs Reviewed  LIPASE, BLOOD - Abnormal; Notable for the following components:      Result Value   Lipase 52 (*)    All other components within normal limits  COMPREHENSIVE METABOLIC PANEL - Abnormal; Notable for the following components:   Sodium 134 (*)    Potassium 2.7 (*)    CO2 21 (*)    Glucose, Bld 111 (*)  Calcium 8.5 (*)    Total Protein 8.6 (*)    Albumin 2.6 (*)    All other components within normal limits  CBC - Abnormal; Notable for the following components:   RBC 3.37 (*)    Hemoglobin 10.0 (*)    HCT 31.1 (*)    All other components within normal limits  URINALYSIS, ROUTINE W REFLEX MICROSCOPIC - Abnormal; Notable for the following components:   APPearance HAZY (*)    Specific Gravity, Urine <1.005 (*)    Hgb urine dipstick MODERATE (*)    Leukocytes,Ua LARGE (*)    All other components within normal limits  CBC - Abnormal; Notable for the following components:   RBC 3.32 (*)    Hemoglobin 9.7 (*)    HCT 30.7 (*)    All other components within normal limits  URINALYSIS, MICROSCOPIC (REFLEX) - Abnormal; Notable for the following components:   Bacteria, UA RARE (*)    All other components within normal limits  POC OCCULT BLOOD, ED - Abnormal; Notable for the following components:   Fecal Occult Bld POSITIVE (*)    All other components within normal limits  STOOL CULTURE  C DIFFICILE QUICK SCREEN W PCR REFLEX  MAGNESIUM    EKG EKG Interpretation  Date/Time:  Wednesday September 02 2020 18:48:37 EST Ventricular Rate:  86 PR Interval:    QRS Duration: 115 QT Interval:  378 QTC Calculation: 453 R Axis:   7 Text Interpretation: Sinus rhythm Nonspecific intraventricular conduction delay When comapred to prior,  similar apperance./ No STEMI Confirmed by Theda Belfast ([id number]) on 09/02/2020 6:53:17 PM   Radiology No results found.  Procedures Procedures   Medications Ordered in ED Medications  potassium chloride SA (KLOR-CON) CR tablet 40 mEq (40 mEq Oral Given 09/02/20 1931)  sodium chloride 0.9 % bolus 1,000 mL (0 mLs Intravenous Stopped 09/02/20 2032)  ondansetron (ZOFRAN) injection 4 mg (4 mg Intravenous Given 09/02/20 1931)  potassium chloride 10 mEq in 100 mL IVPB (0 mEq Intravenous Stopped 09/02/20 2032)  alum & mag hydroxide-simeth (MAALOX/MYLANTA) 200-200-20 MG/5ML suspension 30 mL (30 mLs Oral Given 09/02/20 1931)    And  lidocaine (XYLOCAINE) 2 % viscous mouth solution 15 mL (15 mLs Oral Given 09/02/20 1931)  metoCLOPramide (REGLAN) injection 10 mg (10 mg Intravenous Given 09/02/20 2100)    ED Course  I have reviewed the triage vital signs and the nursing notes.  Pertinent labs & imaging results that were available during my care of the patient were reviewed by me and considered in my medical decision making (see chart for details).    MDM Rules/Calculators/A&P                         35 year old male presents to the ER with complaints of hiccups in the setting of recent nausea, vomiting and diarrhea.  On arrival, he is not hiccuping, overall chronically ill-appearing but nontoxic.  Blood pressures soft with a blood pressure 96/65, afebrile, not tachycardic, tachypneic or hypoxic.  Physical exam overall reassuring, abdomen soft and nontender, no flank tenderness, no lower extremity edema noted.  Labs ordered, reviewed and interpreted by me -CBC with a distal hemoglobin of 10, repeat of 9.7.  This has decreased from 11.78 days ago.  He is Hemoccult positive  here. -CMP with mild hyponatremia, critical potassium of 2.7.  Mild hypocalcemia as well.  Normal renal function and liver function test.  I suspect this hypokalemia secondary to his diarrhea  nausea and vomiting. -Lipase only mildly elevated at 52 -UA with out clear evidence of UTI, with moderate hemoglobin and large leukocytes appears to be largely consistent with prior UAs.  No complaints of flank pain or dysuria -Normal magnesium  EKG without changes consistent with hypokalemia, no prolonged QT  MDM: Abdomen soft, nontender, no peritoneal signs.  Low suspicion for intra-abdominal normality at this time.  He is focally neurologically intact, low suspicion for stroke.  Patient was treated with oral and IV potassium, fluids, GI cocktail.  Patient then reported that his hiccups had returned, evident on my exam.  He was then given Reglan, notes cessation in hiccups, tolerated p.o. challenge well.  No episodes of vomiting here in the ED.  I ordered a stool culture and C. difficile for the patient, however unfortunately he had not informed nursing staff that he was going to make a bowel movement, and we were not able to collect a specimen during his ED stay.  He was given tools to collect this at home.  I encouraged that he bring this to his PCP or infectious disease doctor to have them run the sample.   Patient will need strict follow-up with GI, however does not meet admission criteria.  I stressed the importance of follow-up, stressed importance to have his CBC rechecked and provide stool culture for sampling.   Will provide prescription for Reglan, supplemental potassium.  We discussed return precautions, he voiced understanding and is agreeable.  Case discussed with Dr. Rush Landmark who is agreeable to the above plan and disposition   Final Clinical Impression(s) / ED Diagnoses Final diagnoses:  Hiccups  Heme positive stool  Nausea vomiting and diarrhea  Hypokalemia  Hematuria, unspecified  type    Rx / DC Orders ED Discharge Orders         Ordered    metoCLOPramide (REGLAN) 10 MG tablet  Every 6 hours        09/02/20 2254    potassium chloride SA (KLOR-CON) 20 MEQ tablet  2 times daily        09/02/20 2328               Jacob Gutierrez 09/02/20 2334    Tegeler, Canary Brim, MD 09/02/20 [phone]

## 2020-09-02 NOTE — ED Triage Notes (Signed)
Patient here with complaint of hiccups x5 days. States the hiccupping makes it difficult to sleep and he sometimes it makes him vomit.

## 2020-09-02 NOTE — ED Notes (Signed)
Date and time results received: 09/02/20 1725 (use smartphrase ".now" to insert current time)  Test: Potassium Critical Value: 2.7  Name of Provider Notified: dykstra  Orders Received? Or Actions Taken?: changed acuity

## 2020-09-02 NOTE — ED Notes (Signed)
Pt given hat for stool sample per PA.

## 2020-09-02 NOTE — Discharge Instructions (Addendum)
Your stool was positive for blood today, it is very important that you have your blood counts rechecked within a week.  Please collect a sample  of your stool and bring this to your primary care doctor  or infectious disease doctor to have them run a stool culture on it.   It is also very important that you follow-up with the stomach doctor (GI) whose contact information I provided in your discharge paperwork.  You may take the medicine prescribed as needed to help with your hiccups.  Your potassium was also very low today, please eat potassium rich foods and take the supplements prescribed.  Please see the handout for examples of potassium rich foods. Return to the ER for any new or worsening symptoms.

## 2020-09-03 ENCOUNTER — Encounter: Payer: Self-pay | Admitting: Physician Assistant

## 2020-09-15 MED FILL — SULFAMETHOXAZOLE-TMP DS TAB: 800-160 | 30 days supply | Qty: 30 | Fill #2

## 2020-09-15 MED FILL — SYMTUZA 800-150-200-10 MG T: 800-150-200 | 30 days supply | Qty: 30 | Fill #2

## 2020-09-16 ENCOUNTER — Telehealth: Payer: Self-pay

## 2020-09-16 NOTE — Telephone Encounter (Signed)
[facility] Patient Advocate Encounter  Patient's medications have been couriered to [facility] from [facility] and will be picked up 09/18/21.  Clearance Coots , CPhT Specialty Pharmacy Patient [facility] Infect[facility] Phone: [phone] Fax:  [phone]

## 2020-09-18 ENCOUNTER — Other Ambulatory Visit: Payer: Self-pay | Admitting: Physician Assistant

## 2020-09-18 ENCOUNTER — Other Ambulatory Visit: Payer: Medicaid Other

## 2020-09-18 ENCOUNTER — Encounter: Payer: Self-pay | Admitting: Physician Assistant

## 2020-09-18 ENCOUNTER — Ambulatory Visit (INDEPENDENT_AMBULATORY_CARE_PROVIDER_SITE_OTHER): Payer: Medicaid Other | Admitting: Physician Assistant

## 2020-09-18 VITALS — BP 96/60 | HR 101 | Ht 73.0 in | Wt 161.0 lb

## 2020-09-18 DIAGNOSIS — K921 Melena: Secondary | ICD-10-CM

## 2020-09-18 DIAGNOSIS — R197 Diarrhea, unspecified: Secondary | ICD-10-CM

## 2020-09-18 DIAGNOSIS — R1314 Dysphagia, pharyngoesophageal phase: Secondary | ICD-10-CM

## 2020-09-18 MED ORDER — OMEPRAZOLE 20 MG PO CPDR: 20.0000 mg | DELAYED_RELEASE_CAPSULE | Freq: Two times a day (BID) | ORAL | 5 refills | Status: DC

## 2020-09-18 MED ORDER — LOPERAMIDE HCL 2 MG PO TABS
2.0000 mg | ORAL_TABLET | Freq: Three times a day (TID) | ORAL | 5 refills | Status: DC
Start: 2020-09-18 — End: 2021-05-25

## 2020-09-18 MED FILL — OMEPRAZOLE 20 MG CPDR: 20 | 30 days supply | Qty: 60 | Fill #0

## 2020-09-18 MED FILL — LOPERAMIDE 2 MG CAPSULE: 2 | 10 days supply | Qty: 30 | Fill #0

## 2020-09-18 NOTE — Progress Notes (Signed)
Addendum: Reviewed and agree with assessment and management plan. Pyrtle, Jay M, MD  

## 2020-09-18 NOTE — Progress Notes (Signed)
Chief Complaint: Follow-up after ER visit for nausea and vomiting  HPI:    Jacob Gutierrez is a 35 year old African-American male with a past medical history including AIDS, compliant with HIV medications, Shigella gastroenteritis, syphilis, coccidiomycosis, anal dysplasia and others, who presents to clinic today after being seen in the ER on 09/02/2020 for hiccups, nausea, vomiting and bloody diarrhea.    02/20/2020 CT abdomen pelvis with slight thickening of the rectal wall with a possible degree of proctitis.    09/02/2020 patient seen in the ER and described nausea, intermittent vomiting, bloody diarrhea for the past week.  Also described hiccups which started 5 days prior worse with drinking water.  Rectal exam was guaiac positive, small external hemorrhoid.  Labs showed elevated lipase at 52, CMP with a sodium 134, potassium 2.7, CBC with a hemoglobin of 10.  He was given IV potassium and oral potassium as well as fluids and a GI cocktail.  Also given Reglan which stopped his hiccups.    Today, the patient explains that his hiccups are gone and he is having no further nausea and vomiting but does continue with watery stools that are at least 10 or more per day and very urgent over the past month or so.  He has not tried anything over-the-counter for this.  He is chronically on Bactrim.  Associated symptoms include some bright red blood with his watery stools off and on.    Also describes today that he feels like something is getting stuck in his chest when he drinks worse than when he eats.  He feels like it will just sit in one space and then "drop down".  This has been going on for the past month as well.  Apparently has used over-the-counter Gaviscon and generic Prilosec off and on to see if this helps but it does not really relieve the sensation.    Denies fever, chills, abdominal pain, weight loss or symptoms that awaken him from sleep.  Past Medical History:  Diagnosis Date  . AIDS (acquired  immune deficiency syndrome) (HCC)   . AIDS (acquired immune deficiency syndrome) (HCC)   . 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 H/O coccidioidomycosis    pulmonary   . HIV disease (HCC)   . Past history of allergy to penicillin-type antibiotic 07-03-2012   desensitization  . PNA (pneumonia)   . Shigella gastroenteritis   . Syphilis    history /treated     Past Surgical History:  Procedure Laterality Date  . TRANSURETHRAL RESECTION OF PROSTATE N/A 12/09/2017   Procedure: TRANSURETHRAL RESECTION OF THE PROSTATE (TURP);  Surgeon: Crist Fat, MD;  Location: [facility];  Service: Urology;  Laterality: N/A;    Current Outpatient Medications  Medication Sig Dispense Refill  . Darunavir-Cobicisctat-Emtricitabine-Tenofovir Alafenamide (SYMTUZA) 800-150-200-10 MG TABS Take 1 tablet by mouth daily with breakfast. 30 tablet 5  . sulfamethoxazole-trimethoprim (BACTRIM DS) 800-160 MG tablet Take 1 tablet by mouth daily. 30 tablet 5   No current facility-administered medications for this visit.    Allergies as of 09/18/2020 - Review Complete 09/18/2020  Allergen Reaction Noted  . Amoxicillin Other (See Comments) 12/08/2013  . Vancomycin Itching, Swelling, and Other (See Comments) 04/27/2018    Family History  Problem Relation Age of Onset  . Hypertension Mother   . Lupus Maternal Grandmother   . Diabetes Maternal Grandmother   . Cancer Paternal Grandfather        unknown   . Prostate  cancer Father   . Diabetes Father   . Diabetes Maternal Grandfather   . Colon cancer Neg Hx   . Stomach cancer Neg Hx   . Esophageal cancer Neg Hx   . Pancreatic cancer Neg Hx     Social History   Socioeconomic History  . Marital status: Single    Spouse name: Not on file  . Number of children: Not on file  . Years of education: Not on file  . Highest education level: Not on file  Occupational History  . Not on file  Tobacco Use  . Smoking status:  Current Every Day Smoker    Packs/day: 0.30    Types: Cigarettes  . Smokeless tobacco: Never Used  . Tobacco comment: cutting back  Vaping Use  . Vaping Use: Never used  Substance and Sexual Activity  . Alcohol use: Not Currently    Alcohol/week: 1.0 standard drink    Types: 1 Standard drinks or equivalent per week    Comment: whiskey occasionally   . Drug use: Not Currently    Frequency: 1.0 times per week    Types: Marijuana  . Sexual activity: Yes    Partners: Female, Male    Birth control/protection: Condom    Comment: "I have plenty of condoms, need lube"  Other Topics Concern  . Not on file  Social History Narrative  . Not on file   Social Determinants of Health   Financial Resource Strain: Not on file  Food Insecurity: Not on file  Transportation Needs: Not on file  Physical Activity: Not on file  Stress: Not on file  Social Connections: Not on file  Intimate Partner Violence: Not on file    Review of Systems:    Constitutional: No weight loss, fever or chills Skin: No rash  Cardiovascular: No chest pain Respiratory: No SOB  Gastrointestinal: See HPI and otherwise negative Genitourinary: No dysuria  Neurological: No headache, dizziness or syncope Musculoskeletal: No new muscle or joint pain Hematologic: No bruising Psychiatric: No history of depression or anxiety   Physical Exam:  Vital signs: BP 96/60   Pulse (!) 101   Ht 6\' 1"  (1.854 m)   Wt 161 lb (73 kg)   BMI 21.24 kg/m   Constitutional:   Pleasant chronically ill appearing AA male appears to be in NAD, Well developed, Well nourished, alert and cooperative Head:  Normocephalic and atraumatic. Eyes:   PEERL, EOMI. No icterus. Conjunctiva pink. Ears:  Normal auditory acuity. Neck:  Supple Throat: Oral cavity and pharynx without inflammation, swelling or lesion.  Respiratory: Respirations even and unlabored. Lungs clear to auscultation bilaterally.   No wheezes, crackles, or rhonchi.   Cardiovascular: Normal S1, S2. No MRG. Regular rate and rhythm. No peripheral edema, cyanosis or pallor.  Gastrointestinal:  Soft, nondistended, nontender. No rebound or guarding.  Increased bowel sounds all 4 quadrants. No appreciable masses or hepatomegaly. Rectal:  Not performed.  Msk:  Symmetrical without gross deformities. Without edema, no deformity or joint abnormality.  Neurologic:  Alert and  oriented x4;  grossly normal neurologically.  Skin:   Dry and intact without significant lesions or rashes. Psychiatric: Demonstrates good judgement and reason without abnormal affect or behaviors.  RELEVANT LABS AND IMAGING: CBC    Component Value Date/Time   WBC 4.2 09/02/2020 2117   RBC 3.32 (L) 09/02/2020 2117   HGB 9.7 (L) 09/02/2020 2117   HGB 13.5 09/27/2011 0740   HCT 30.7 (L) 09/02/2020 2117   HCT 37.9 (L)  09/27/2011 0740   PLT 292 09/02/2020 2117   PLT 127 (L) 09/27/2011 0740   MCV 92.5 09/02/2020 2117   MCV 101 (H) 09/27/2011 0740   MCH 29.2 09/02/2020 2117   MCHC 31.6 09/02/2020 2117   RDW 15.2 09/02/2020 2117   RDW 13.7 09/27/2011 0740   LYMPHSABS 521 (L) 08/25/2020 1008   LYMPHSABS 1.3 09/27/2011 0740   MONOABS 0.6 08/07/2020 0551   MONOABS 0.5 09/27/2011 0740   EOSABS 62 08/25/2020 1008   EOSABS 0.0 09/27/2011 0740   BASOSABS 62 08/25/2020 1008   BASOSABS 0.0 09/27/2011 0740    CMP     Component Value Date/Time   NA 134 (L) 09/02/2020 1631   NA 141 09/27/2011 0740   K 2.7 (LL) 09/02/2020 1631   K 3.2 (L) 09/27/2011 0740   CL 101 09/02/2020 1631   CL 104 09/27/2011 0740   CO2 21 (L) 09/02/2020 1631   CO2 28 09/27/2011 0740   GLUCOSE 111 (H) 09/02/2020 1631   GLUCOSE 99 09/27/2011 0740   BUN 13 09/02/2020 1631   BUN 12 09/27/2011 0740   CREATININE 1.09 09/02/2020 1631   CREATININE 1.05 08/25/2020 1008   CALCIUM 8.5 (L) 09/02/2020 1631   CALCIUM 8.1 (L) 09/27/2011 0740   PROT 8.6 (H) 09/02/2020 1631   PROT 8.7 (H) 09/27/2011 0040   ALBUMIN 2.6 (L)  09/02/2020 1631   ALBUMIN 3.3 (L) 09/27/2011 0040   AST 28 09/02/2020 1631   AST 32 09/27/2011 0040   ALT 20 09/02/2020 1631   ALT 20 09/27/2011 0040   ALKPHOS 49 09/02/2020 1631   ALKPHOS 52 09/27/2011 0040   BILITOT 0.6 09/02/2020 1631   BILITOT 0.7 09/27/2011 0040   GFRNONAA >60 09/02/2020 1631   GFRNONAA 90 08/25/2020 1008   GFRAA 105 08/25/2020 1008    Assessment: 1.  Diarrhea: For the past 3 to 4 weeks per the patient, more than 10 times a day, urgent and watery; consider infectious cause versus other 2.  Nausea and vomiting: Some better now 3.  Dysphagia: Worse with liquids; consider stricture versus dysmotility versus other  Plan: 1.  Discussed with the patient that he will likely need EGD with dilation and colonoscopy, but first we will need to rule out infectious cause for his diarrhea. 2.  Ordered GI pathogen panel, O&P, fecal lactoferrin and fecal pancreatic elastase 3.  Started the patient on Imodium 1 tab every 8 hours 4.  Started Omeprazole 20 mg twice daily, 30-60 minutes before breakfast and dinner #60 with 5 refills. 5.  Patient will need a diagnostic EGD and colonoscopy.  As long as stool studies are negative we will go ahead and schedule these with Dr. Rhea Belton in the [place]. He is vaccinated for COVID-19.  Jacob Meeker, PA-C [facility] 09/18/2020, 9:21 AM  Cc: No ref. provider found

## 2020-09-18 NOTE — Patient Instructions (Addendum)
If you are age 37 or older, your body mass index should be between 23-30. Your Body mass index is 21.24 kg/m. If this is out of the aforementioned range listed, please consider follow up with your Primary Care Provider.  If you are age 61 or younger, your body mass index should be between 19-25. Your Body mass index is 21.24 kg/m. If this is out of the aformentioned range listed, please consider follow up with your Primary Care Provider.  Your provider has requested that you go to the basement level for lab work before leaving today. Press "B" on the elevator. The lab is located at the first door on the left as you exit the elevator.  We have sent the following medications to your pharmacy for you to pick up at your convenience: Omeprazole and imodium.  Thank you for choosing me and [facility].  Hyacinth Meeker, PA-C

## 2020-09-23 LAB — GI PROFILE, STOOL, PCR
Adenovirus F 40/41: NOT DETECTED
Astrovirus: NOT DETECTED
C difficile toxin A/B: NOT DETECTED
Campylobacter: NOT DETECTED
Cryptosporidium: NOT DETECTED
Cyclospora cayetanensis: NOT DETECTED
Entamoeba histolytica: NOT DETECTED
Enteroaggregative E coli: NOT DETECTED
Enteropathogenic E coli: NOT DETECTED
Enterotoxigenic E coli: NOT DETECTED
Giardia lamblia: NOT DETECTED
Norovirus GI/GII: NOT DETECTED
Plesiomonas shigelloides: NOT DETECTED
Rotavirus A: NOT DETECTED
Salmonella: NOT DETECTED
Sapovirus: NOT DETECTED
Shiga-toxin-producing E coli: NOT DETECTED
Shigella/Enteroinvasive E coli: DETECTED — AB
Vibrio cholerae: NOT DETECTED
Vibrio: NOT DETECTED
Yersinia enterocolitica: NOT DETECTED

## 2020-09-24 ENCOUNTER — Other Ambulatory Visit: Payer: Self-pay

## 2020-09-24 ENCOUNTER — Telehealth: Payer: Self-pay | Admitting: Physician Assistant

## 2020-09-24 MED ORDER — AZITHROMYCIN 500 MG PO TABS: 500.0000 mg | ORAL_TABLET | Freq: Every day | ORAL | 0 refills | Status: DC

## 2020-09-24 MED FILL — AZITHROMYCIN 500 MG TABS: 500 | 7 days supply | Qty: 7 | Fill #0

## 2020-09-24 NOTE — Telephone Encounter (Signed)
Talma from [facility] is requesting a call back regarding an alert she has to report.  [id number] Opt 1 Reference#: [id number]

## 2020-09-24 NOTE — Telephone Encounter (Signed)
Spoke with Denny Peon at [facility] in regards to detection of Shigella/E. Coli on stool test. Provider is already aware.

## 2020-09-25 ENCOUNTER — Other Ambulatory Visit (HOSPITAL_COMMUNITY): Payer: Self-pay | Admitting: Physician Assistant

## 2020-09-25 ENCOUNTER — Other Ambulatory Visit: Payer: Self-pay

## 2020-09-25 MED ORDER — PANCRELIPASE (LIP-PROT-AMYL) 36000-114000 UNITS PO CPEP: ORAL_CAPSULE | ORAL | 11 refills | Status: DC

## 2020-09-25 MED ORDER — AZITHROMYCIN 500 MG PO TABS: 500.0000 mg | ORAL_TABLET | Freq: Every day | ORAL | 0 refills | Status: AC

## 2020-09-28 LAB — FECAL LACTOFERRIN, QUANT
Fecal Lactoferrin: POSITIVE — AB
MICRO NUMBER:: 11580106
SPECIMEN QUALITY:: ADEQUATE

## 2020-09-28 LAB — OVA AND PARASITE EXAMINATION
CONCENTRATE RESULT:: NONE SEEN
MICRO NUMBER:: 11580169
SPECIMEN QUALITY:: ADEQUATE
TRICHROME RESULT:: NONE SEEN

## 2020-09-28 LAB — PANCREATIC ELASTASE, FECAL: Pancreatic Elastase-1, Stool: 72 mcg/g — ABNORMAL LOW

## 2020-09-28 MED FILL — AZITHROMYCIN 500 MG TABLET: 500 | 7 days supply | Qty: 7 | Fill #0

## 2020-09-28 MED FILL — CREON DR 36,000 UNITS CAP: 36000-11400 | 30 days supply | Qty: 240 | Fill #0

## 2020-10-07 NOTE — Progress Notes (Signed)
Patient ID: Jacob Gutierrez, male   DOB: April 17, 1986, 36 y.o.   MRN: [id number]     Jacob Gutierrez, is a 35 y.o. male  VFI:[id number]  [id number]:[id number]  DOB - 07-19-1986  Subjective:  Chief Complaint and HPI: Jacob Gutierrez is a 35 y.o. male here today to establish care and for a follow up visit After ED visit 09/02/2020.  Saw GI 09/18/2020.  Still not swallowing well and he feels as though it is getting worse.  Vomiting after most meds.  Still having diarrhea.  Complex hospitalization and comorbidities-see below.    No fever.     From ED note 09/02/2020 Jacob Gutierrez is a 35 y.o. male.  HPI 35 year old male with an extensive medical history including AIDS, compliant with his HIV medications, Shigella gastroenteritis, syphilis, coccidiomycosis, anal dysplasia presents to the ER with complaints of hiccups x5 days, nausea, intermittent vomiting and bloody diarrhea has now become more watery which has been ongoing for the last week.  He states the hiccups started about 5 days ago.  Feels as though gets worse when he drinks water.  He has tried holding his breath, taking a spoonful of sugar and peanut butter with little relief.  He has never had this happen before.  He denies any abdominal pain, dysuria.  He does not follow with GI.  Denies any fevers or chills.  No flank tenderness.  From A/P: 35 year old male presents to the ER with complaints of hiccups in the setting of recent nausea, vomiting and diarrhea.  On arrival, he is not hiccuping, overall chronically ill-appearing but nontoxic.  Blood pressures soft with a blood pressure 96/65, afebrile, not tachycardic, tachypneic or hypoxic.  Physical exam overall reassuring, abdomen soft and nontender, no flank tenderness, no lower extremity edema noted.  Labs ordered, reviewed and interpreted by me -CBC with a distal hemoglobin of 10, repeat of 9.7.  This has decreased from 11.78 days ago.  He is Hemoccult positive here. -CMP with mild  hyponatremia, critical potassium of 2.7.  Mild hypocalcemia as well.  Normal renal function and liver function test.  I suspect this hypokalemia secondary to his diarrhea nausea and vomiting. -Lipase only mildly elevated at 52 -UA with out clear evidence of UTI, with moderate hemoglobin and large leukocytes appears to be largely consistent with prior UAs.  No complaints of flank pain or dysuria -Normal magnesium  EKG without changes consistent with hypokalemia, no prolonged QT  MDM: Abdomen soft, nontender, no peritoneal signs.  Low suspicion for intra-abdominal normality at this time.  He is focally neurologically intact, low suspicion for stroke.  Patient was treated with oral and IV potassium, fluids, GI cocktail.  Patient then reported that his hiccups had returned, evident on my exam.  He was then given Reglan, notes cessation in hiccups, tolerated p.o. challenge well.  No episodes of vomiting here in the ED.  I ordered a stool culture and C. difficile for the patient, however unfortunately he had not informed nursing staff that he was going to make a bowel movement, and we were not able to collect a specimen during his ED stay.  He was given tools to collect this at home.  I encouraged that he bring this to his PCP or infectious disease doctor to have them run the sample.   Patient will need strict follow-up with GI, however does not meet admission criteria.  I stressed the importance of follow-up, stressed importance to have his CBC rechecked and provide stool culture for sampling.  Will provide prescription for Reglan, supplemental potassium.  From GI visit: Assessment: 1.  Diarrhea: For the past 3 to 4 weeks per the patient, more than 10 times a day, urgent and watery; consider infectious cause versus other 2.  Nausea and vomiting: Some better now 3.  Dysphagia: Worse with liquids; consider stricture versus dysmotility versus other  Plan: 1.  Discussed with the patient that  he will likely need EGD with dilation and colonoscopy, but first we will need to rule out infectious cause for his diarrhea. 2.  Ordered GI pathogen panel, O&P, fecal lactoferrin and fecal pancreatic elastase 3.  Started the patient on Imodium 1 tab every 8 hours 4.  Started Omeprazole 20 mg twice daily, 30-60 minutes before breakfast and dinner #60 with 5 refills. 5.  Patient will need a diagnostic EGD and colonoscopy.  As long as stool studies are negative we will go ahead and schedule these with Dr. Rhea Belton in the [facility]. He is vaccinated for COVID-19.  Hyacinth Meeker, PA-C  Gastroenterology    ED/Hospital notes reviewed.    ROS:   Constitutional:  No f/c, No night sweats, No unexplained weight loss. EENT:  No vision changes, No blurry vision, No hearing changes. No mouth, throat, or ear problems.  +difficulty swallowing solids and liquids Respiratory: No cough, No SOB Cardiac: No CP, no palpitations GI:  No abd pain, + N/V/D. GU: No Urinary s/sx Musculoskeletal: No joint pain Neuro: No headache, no dizziness, no motor weakness.  Skin: No rash Endocrine:  No polydipsia. No polyuria.  Psych: Denies SI/HI  No problems updated.  ALLERGIES: Allergies  Allergen Reactions  . Amoxicillin Other (See Comments)    From childhood: "I had a reaction when i was little." (??)  . Vancomycin Itching, Swelling and Other (See Comments)    Angioedema, also    PAST MEDICAL HISTORY: Past Medical History:  Diagnosis Date  . AIDS (acquired immune deficiency syndrome) (HCC)   . AIDS (acquired immune deficiency syndrome) (HCC)   . 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 H/O coccidioidomycosis    pulmonary   . HIV disease (HCC)   . Past history of allergy to penicillin-type antibiotic 07-03-2012   desensitization  . PNA (pneumonia)   . Shigella gastroenteritis   . Syphilis    history /treated     MEDICATIONS AT HOME: Prior to Admission  medications   Medication Sig Start Date End Date Taking? Authorizing Provider  Darunavir-Cobicisctat-Emtricitabine-Tenofovir Alafenamide (SYMTUZA) 800-150-200-10 MG TABS Take 1 tablet by mouth daily with breakfast. 07/08/20  Yes Kuppelweiser, Cassie L, RPH-CPP  lipase/protease/amylase (CREON) 36000 UNITS CPEP capsule Take 2 capsules (72,000 units) by mouth WITH every meal, take 1 capsule (36,000 units) by mouth with every snack. 09/25/20  Yes Unk Lightning, PA  loperamide (IMODIUM A-D) 2 MG tablet Take 1 tablet (2 mg total) by mouth every 8 (eight) hours. 09/18/20  Yes Unk Lightning, PA  omeprazole (PRILOSEC) 20 MG capsule Take 1 capsule (20 mg total) by mouth 2 (two) times daily before a meal. Take one capsule 30-60 minutes before breakfast and dinner 09/18/20  Yes Unk Lightning, PA  ondansetron (ZOFRAN-ODT) 8 MG disintegrating tablet Take 1 tablet (8 mg total) by mouth every 8 (eight) hours as needed for nausea or vomiting. 10/08/20  Yes Georgian Co M, PA-C  sulfamethoxazole-trimethoprim (BACTRIM DS) 800-160 MG tablet Take 1 tablet by mouth daily. Patient not taking: Reported on 10/08/2020 07/08/20   Kuppelweiser, Wilford Sports  L, RPH-CPP     Objective:  EXAM:   Vitals:   10/08/20 1349  BP: 93/61  Pulse: 87  SpO2: 98%  Weight: 150 lb 6.4 oz (68.2 kg)    General appearance : A&OX3. NAD. Non-toxic-appearing; appears to be in poor health; thin bordering on cachectic HEENT: Atraumatic and Normocephalic.  PERRLA. EOM intact.  Mouth-MMM, post pharynx WNL w/o erythema and is patent, No PND. Neck: supple, no JVD. No cervical lymphadenopathy. No thyromegaly Chest/Lungs:  Breathing-non-labored, Good air entry bilaterally, breath sounds normal without rales, rhonchi, or wheezing  CVS: S1 S2 regular, no murmurs, gallops, rubs  Abdomen: Bowel sounds present, Non tender and not distended with no gaurding, rigidity or rebound. Extremities: Bilateral Lower Ext shows no edema, both  legs are warm to touch with = pulse throughout Neurology:  CN II-XII grossly intact, Non focal.   Psych:  TP linear. J/I WNL. Normal speech. Appropriate eye contact and affect.  Skin:  No Rash  Data Review No results found for: HGBA1C   Assessment & Plan   1. Dysphagia, unspecified type Worsening-we called and moved his GI appt from 4/4 to 3/28.  To ED if wosens at all or zofran ODT does not help to keep food/liquids in - Comprehensive metabolic panel - CBC with Differential/Platelet  2. Nausea and vomiting, intractability of vomiting not specified, unspecified vomiting type To ED if wosens at all or zofran ODT does not help to keep food/liquids in  - Comprehensive metabolic panel - CBC with Differential/Platelet - ondansetron (ZOFRAN-ODT) 8 MG disintegrating tablet; Take 1 tablet (8 mg total) by mouth every 8 (eight) hours as needed for nausea or vomiting.  Dispense: 30 tablet; Refill: 0  3. Diarrhea of infectious origin - CBC with Differential/Platelet  4. AIDS (acquired immune deficiency syndrome) (HCC) Followed by ID  5. Anemia, unspecified type - CBC with Differential/Platelet  6. Hospital discharge follow-up  7. Hypotension, unspecified hypotension type BP always on low side.  No dizziness/s/sx   Patient have been counseled extensively about nutrition and exercise  Return in about 2 months (around 12/08/2020) for assign PCP.  The patient was given clear instructions to go to ER or return to medical center if symptoms don't improve, worsen or new problems develop. The patient verbalized understanding. The patient was told to call to get lab results if they haven't heard anything in the next week.     Georgian Co, PA-C [facility] [place], [place] [phone]   10/08/2020, 2:09 PM

## 2020-10-08 ENCOUNTER — Ambulatory Visit: Payer: Medicaid Other | Attending: Physician Assistant | Admitting: Physician Assistant

## 2020-10-08 ENCOUNTER — Other Ambulatory Visit: Payer: Self-pay | Admitting: Physician Assistant

## 2020-10-08 ENCOUNTER — Encounter: Payer: Self-pay | Admitting: Physician Assistant

## 2020-10-08 VITALS — BP 93/61 | HR 87 | Wt 150.4 lb

## 2020-10-08 DIAGNOSIS — B20 Human immunodeficiency virus [HIV] disease: Secondary | ICD-10-CM

## 2020-10-08 DIAGNOSIS — R112 Nausea with vomiting, unspecified: Secondary | ICD-10-CM | POA: Diagnosis not present

## 2020-10-08 DIAGNOSIS — I959 Hypotension, unspecified: Secondary | ICD-10-CM

## 2020-10-08 DIAGNOSIS — D649 Anemia, unspecified: Secondary | ICD-10-CM

## 2020-10-08 DIAGNOSIS — R1310 Dysphagia, unspecified: Secondary | ICD-10-CM

## 2020-10-08 DIAGNOSIS — A09 Infectious gastroenteritis and colitis, unspecified: Secondary | ICD-10-CM | POA: Diagnosis not present

## 2020-10-08 DIAGNOSIS — Z09 Encounter for follow-up examination after completed treatment for conditions other than malignant neoplasm: Secondary | ICD-10-CM

## 2020-10-08 MED ORDER — ONDANSETRON 8 MG PO TBDP: 8.0000 mg | ORAL_TABLET | Freq: Three times a day (TID) | ORAL | 0 refills | Status: DC | PRN

## 2020-10-08 NOTE — Patient Instructions (Signed)
Drink as much liquid as you are able  Go to the emergency department if you are unable to keep anything down or worsen

## 2020-10-08 NOTE — Progress Notes (Signed)
Feels like something is stuck in throat, cant eat or drink water.

## 2020-10-09 LAB — CBC WITH DIFFERENTIAL/PLATELET
Basophils Absolute: 0.1 10*3/uL (ref 0.0–0.2)
Basos: 2 %
EOS (ABSOLUTE): 0.1 10*3/uL (ref 0.0–0.4)
Eos: 2 %
Hematocrit: 32.1 % — ABNORMAL LOW (ref 37.5–51.0)
Hemoglobin: 10.8 g/dL — ABNORMAL LOW (ref 13.0–17.7)
Immature Grans (Abs): 0.2 10*3/uL — ABNORMAL HIGH (ref 0.0–0.1)
Immature Granulocytes: 4 %
Lymphocytes Absolute: 0.7 10*3/uL (ref 0.7–3.1)
Lymphs: 16 %
MCH: 30.7 pg (ref 26.6–33.0)
MCHC: 33.6 g/dL (ref 31.5–35.7)
MCV: 91 fL (ref 79–97)
Monocytes Absolute: 0.5 10*3/uL (ref 0.1–0.9)
Monocytes: 13 %
Neutrophils Absolute: 2.6 10*3/uL (ref 1.4–7.0)
Neutrophils: 63 %
Platelets: 274 10*3/uL (ref 150–450)
RBC: 3.52 x10E6/uL — ABNORMAL LOW (ref 4.14–5.80)
RDW: 16 % — ABNORMAL HIGH (ref 11.6–15.4)
WBC: 4.1 10*3/uL (ref 3.4–10.8)

## 2020-10-09 LAB — COMPREHENSIVE METABOLIC PANEL
ALT: 18 IU/L (ref 0–44)
AST: 34 IU/L (ref 0–40)
Albumin/Globulin Ratio: 0.6 — ABNORMAL LOW (ref 1.2–2.2)
Albumin: 3.1 g/dL — ABNORMAL LOW (ref 4.0–5.0)
Alkaline Phosphatase: 81 IU/L (ref 44–121)
BUN/Creatinine Ratio: 11 (ref 9–20)
BUN: 10 mg/dL (ref 6–20)
Bilirubin Total: 0.3 mg/dL (ref 0.0–1.2)
CO2: 22 mmol/L (ref 20–29)
Calcium: 8.4 mg/dL — ABNORMAL LOW (ref 8.7–10.2)
Chloride: 100 mmol/L (ref 96–106)
Creatinine, Ser: 0.95 mg/dL (ref 0.76–1.27)
Globulin, Total: 4.8 g/dL — ABNORMAL HIGH (ref 1.5–4.5)
Glucose: 94 mg/dL (ref 65–99)
Potassium: 3 mmol/L — ABNORMAL LOW (ref 3.5–5.2)
Sodium: 139 mmol/L (ref 134–144)
Total Protein: 7.9 g/dL (ref 6.0–8.5)
eGFR: 106 mL/min/{1.73_m2} (ref >59)

## 2020-10-12 MED FILL — SULFAMETHOXAZOLE-TMP DS TAB: 800-160 | 30 days supply | Qty: 30 | Fill #3

## 2020-10-12 MED FILL — SYMTUZA 800-150-200-10 MG T: 800-150-200 | 30 days supply | Qty: 30 | Fill #3

## 2020-10-14 ENCOUNTER — Other Ambulatory Visit: Payer: Self-pay | Admitting: Physician Assistant

## 2020-10-14 MED ORDER — FERROUS SULFATE 324 (65 FE) MG PO TBEC: 1.0000 | DELAYED_RELEASE_TABLET | Freq: Every day | ORAL | 1 refills | Status: DC

## 2020-10-14 MED ORDER — POTASSIUM CHLORIDE ER 10 MEQ PO TBCR: 20.0000 meq | EXTENDED_RELEASE_TABLET | Freq: Every day | ORAL | 1 refills | Status: DC

## 2020-10-14 MED FILL — POTASSIUM CL ER 10 MEQ TAB: 10 | 30 days supply | Qty: 60 | Fill #0

## 2020-10-15 ENCOUNTER — Inpatient Hospital Stay (HOSPITAL_COMMUNITY)
Admission: EM | Admit: 2020-10-15 | Discharge: 2020-10-23 | DRG: 371 | Disposition: A | Discharge status: Home / Self Care | Payer: Medicaid Other | Attending: Internal Medicine | Admitting: Internal Medicine

## 2020-10-15 ENCOUNTER — Emergency Department (HOSPITAL_COMMUNITY): Payer: Medicaid Other

## 2020-10-15 ENCOUNTER — Other Ambulatory Visit: Payer: Self-pay

## 2020-10-15 ENCOUNTER — Ambulatory Visit (INDEPENDENT_AMBULATORY_CARE_PROVIDER_SITE_OTHER): Payer: Medicaid Other | Admitting: Family

## 2020-10-15 ENCOUNTER — Encounter (HOSPITAL_COMMUNITY): Payer: Self-pay | Admitting: Emergency Medicine

## 2020-10-15 ENCOUNTER — Encounter: Payer: Self-pay | Admitting: Family

## 2020-10-15 VITALS — BP 97/61 | HR 96 | Temp 98.1°F | Wt 143.0 lb

## 2020-10-15 DIAGNOSIS — Z8249 Family history of ischemic heart disease and other diseases of the circulatory system: Secondary | ICD-10-CM | POA: Diagnosis not present

## 2020-10-15 DIAGNOSIS — Z6820 Body mass index (BMI) 20.0-20.9, adult: Secondary | ICD-10-CM

## 2020-10-15 DIAGNOSIS — R197 Diarrhea, unspecified: Secondary | ICD-10-CM

## 2020-10-15 DIAGNOSIS — R1310 Dysphagia, unspecified: Secondary | ICD-10-CM

## 2020-10-15 DIAGNOSIS — R918 Other nonspecific abnormal finding of lung field: Secondary | ICD-10-CM

## 2020-10-15 DIAGNOSIS — Z881 Allergy status to other antibiotic agents status: Secondary | ICD-10-CM

## 2020-10-15 DIAGNOSIS — R1110 Vomiting, unspecified: Secondary | ICD-10-CM

## 2020-10-15 DIAGNOSIS — K602 Anal fissure, unspecified: Secondary | ICD-10-CM | POA: Diagnosis not present

## 2020-10-15 DIAGNOSIS — R112 Nausea with vomiting, unspecified: Secondary | ICD-10-CM | POA: Diagnosis not present

## 2020-10-15 DIAGNOSIS — K529 Noninfective gastroenteritis and colitis, unspecified: Secondary | ICD-10-CM | POA: Diagnosis not present

## 2020-10-15 DIAGNOSIS — J439 Emphysema, unspecified: Secondary | ICD-10-CM | POA: Diagnosis present

## 2020-10-15 DIAGNOSIS — Z8042 Family history of malignant neoplasm of prostate: Secondary | ICD-10-CM | POA: Diagnosis not present

## 2020-10-15 DIAGNOSIS — Z9079 Acquired absence of other genital organ(s): Secondary | ICD-10-CM | POA: Diagnosis not present

## 2020-10-15 DIAGNOSIS — F17210 Nicotine dependence, cigarettes, uncomplicated: Secondary | ICD-10-CM | POA: Diagnosis present

## 2020-10-15 DIAGNOSIS — E876 Hypokalemia: Secondary | ICD-10-CM

## 2020-10-15 DIAGNOSIS — Z9119 Patient's noncompliance with other medical treatment and regimen: Secondary | ICD-10-CM | POA: Diagnosis not present

## 2020-10-15 DIAGNOSIS — Z8614 Personal history of Methicillin resistant Staphylococcus aureus infection: Secondary | ICD-10-CM

## 2020-10-15 DIAGNOSIS — F1210 Cannabis abuse, uncomplicated: Secondary | ICD-10-CM | POA: Diagnosis present

## 2020-10-15 DIAGNOSIS — A042 Enteroinvasive Escherichia coli infection: Principal | ICD-10-CM | POA: Diagnosis present

## 2020-10-15 DIAGNOSIS — E860 Dehydration: Secondary | ICD-10-CM

## 2020-10-15 DIAGNOSIS — A039 Shigellosis, unspecified: Secondary | ICD-10-CM | POA: Diagnosis present

## 2020-10-15 DIAGNOSIS — B3781 Candidal esophagitis: Secondary | ICD-10-CM | POA: Diagnosis present

## 2020-10-15 DIAGNOSIS — Z833 Family history of diabetes mellitus: Secondary | ICD-10-CM

## 2020-10-15 DIAGNOSIS — K2101 Gastro-esophageal reflux disease with esophagitis, with bleeding: Secondary | ICD-10-CM | POA: Diagnosis present

## 2020-10-15 DIAGNOSIS — N10 Acute tubulo-interstitial nephritis: Secondary | ICD-10-CM | POA: Diagnosis present

## 2020-10-15 DIAGNOSIS — Z8616 Personal history of COVID-19: Secondary | ICD-10-CM

## 2020-10-15 DIAGNOSIS — K8681 Exocrine pancreatic insufficiency: Secondary | ICD-10-CM | POA: Diagnosis present

## 2020-10-15 DIAGNOSIS — A09 Infectious gastroenteritis and colitis, unspecified: Secondary | ICD-10-CM | POA: Diagnosis not present

## 2020-10-15 DIAGNOSIS — D539 Nutritional anemia, unspecified: Secondary | ICD-10-CM | POA: Diagnosis present

## 2020-10-15 DIAGNOSIS — R933 Abnormal findings on diagnostic imaging of other parts of digestive tract: Secondary | ICD-10-CM | POA: Diagnosis not present

## 2020-10-15 DIAGNOSIS — Z880 Allergy status to penicillin: Secondary | ICD-10-CM

## 2020-10-15 DIAGNOSIS — K2211 Ulcer of esophagus with bleeding: Secondary | ICD-10-CM | POA: Diagnosis present

## 2020-10-15 DIAGNOSIS — K2100 Gastro-esophageal reflux disease with esophagitis, without bleeding: Secondary | ICD-10-CM

## 2020-10-15 DIAGNOSIS — Z79899 Other long term (current) drug therapy: Secondary | ICD-10-CM | POA: Diagnosis not present

## 2020-10-15 DIAGNOSIS — K2970 Gastritis, unspecified, without bleeding: Secondary | ICD-10-CM | POA: Diagnosis present

## 2020-10-15 DIAGNOSIS — B20 Human immunodeficiency virus [HIV] disease: Secondary | ICD-10-CM

## 2020-10-15 DIAGNOSIS — N390 Urinary tract infection, site not specified: Secondary | ICD-10-CM

## 2020-10-15 DIAGNOSIS — K6289 Other specified diseases of anus and rectum: Secondary | ICD-10-CM | POA: Diagnosis not present

## 2020-10-15 DIAGNOSIS — Z5901 Sheltered homelessness: Secondary | ICD-10-CM | POA: Diagnosis not present

## 2020-10-15 DIAGNOSIS — R64 Cachexia: Secondary | ICD-10-CM

## 2020-10-15 DIAGNOSIS — K449 Diaphragmatic hernia without obstruction or gangrene: Secondary | ICD-10-CM | POA: Diagnosis present

## 2020-10-15 DIAGNOSIS — N12 Tubulo-interstitial nephritis, not specified as acute or chronic: Secondary | ICD-10-CM | POA: Diagnosis present

## 2020-10-15 DIAGNOSIS — K6282 Dysplasia of anus: Secondary | ICD-10-CM | POA: Diagnosis present

## 2020-10-15 DIAGNOSIS — K209 Esophagitis, unspecified without bleeding: Secondary | ICD-10-CM | POA: Diagnosis not present

## 2020-10-15 DIAGNOSIS — A498 Other bacterial infections of unspecified site: Secondary | ICD-10-CM | POA: Diagnosis not present

## 2020-10-15 DIAGNOSIS — N2889 Other specified disorders of kidney and ureter: Secondary | ICD-10-CM | POA: Diagnosis not present

## 2020-10-15 DIAGNOSIS — E43 Unspecified severe protein-calorie malnutrition: Secondary | ICD-10-CM | POA: Diagnosis present

## 2020-10-15 DIAGNOSIS — R319 Hematuria, unspecified: Secondary | ICD-10-CM | POA: Diagnosis not present

## 2020-10-15 DIAGNOSIS — K2980 Duodenitis without bleeding: Secondary | ICD-10-CM | POA: Diagnosis present

## 2020-10-15 DIAGNOSIS — K8689 Other specified diseases of pancreas: Secondary | ICD-10-CM | POA: Diagnosis present

## 2020-10-15 LAB — I-STAT VENOUS BLOOD GAS, ED
Acid-Base Excess: 2 mmol/L (ref 0.0–2.0)
Bicarbonate: 28.1 mmol/L — ABNORMAL HIGH (ref 20.0–28.0)
Calcium, Ion: 1.13 mmol/L — ABNORMAL LOW (ref 1.15–1.40)
HCT: 35 % — ABNORMAL LOW (ref 39.0–52.0)
Hemoglobin: 11.9 g/dL — ABNORMAL LOW (ref 13.0–17.0)
O2 Saturation: 98 %
Potassium: 2.9 mmol/L — ABNORMAL LOW (ref 3.5–5.1)
Sodium: 138 mmol/L (ref 135–145)
TCO2: 30 mmol/L (ref 22–32)
pCO2, Ven: 49.3 mmHg (ref 44.0–60.0)
pH, Ven: 7.364 (ref 7.250–7.430)
pO2, Ven: 115 mmHg — ABNORMAL HIGH (ref 32.0–45.0)

## 2020-10-15 LAB — COMPREHENSIVE METABOLIC PANEL
ALT: 16 U/L (ref 0–44)
AST: 32 U/L (ref 15–41)
Albumin: 2.6 g/dL — ABNORMAL LOW (ref 3.5–5.0)
Alkaline Phosphatase: 60 U/L (ref 38–126)
Anion gap: 10 (ref 5–15)
BUN: 12 mg/dL (ref 6–20)
CO2: 25 mmol/L (ref 22–32)
Calcium: 8.5 mg/dL — ABNORMAL LOW (ref 8.9–10.3)
Chloride: 102 mmol/L (ref 98–111)
Creatinine, Ser: 1.01 mg/dL (ref 0.61–1.24)
GFR, Estimated: >60 mL/min (ref >60)
Glucose, Bld: 91 mg/dL (ref 70–99)
Potassium: 2.8 mmol/L — ABNORMAL LOW (ref 3.5–5.1)
Sodium: 137 mmol/L (ref 135–145)
Total Bilirubin: 0.7 mg/dL (ref 0.3–1.2)
Total Protein: 8.5 g/dL — ABNORMAL HIGH (ref 6.5–8.1)

## 2020-10-15 LAB — TROPONIN I (HIGH SENSITIVITY)
Troponin I (High Sensitivity): 6 ng/L (ref <18)
Troponin I (High Sensitivity): 6 ng/L (ref <18)

## 2020-10-15 LAB — CBC
HCT: 39 % (ref 39.0–52.0)
Hemoglobin: 12.2 g/dL — ABNORMAL LOW (ref 13.0–17.0)
MCH: 31.6 pg (ref 26.0–34.0)
MCHC: 31.3 g/dL (ref 30.0–36.0)
MCV: 101 fL — ABNORMAL HIGH (ref 80.0–100.0)
Platelets: 218 10*3/uL (ref 150–400)
RBC: 3.86 MIL/uL — ABNORMAL LOW (ref 4.22–5.81)
RDW: 16.9 % — ABNORMAL HIGH (ref 11.5–15.5)
WBC: 4.1 10*3/uL (ref 4.0–10.5)
nRBC: 0 % (ref 0.0–0.2)

## 2020-10-15 LAB — URINALYSIS, ROUTINE W REFLEX MICROSCOPIC
Bilirubin Urine: NEGATIVE
Glucose, UA: NEGATIVE mg/dL
Ketones, ur: NEGATIVE mg/dL
Nitrite: POSITIVE — AB
Protein, ur: 100 mg/dL — AB
RBC / HPF: >50 RBC/hpf — ABNORMAL HIGH (ref 0–5)
Specific Gravity, Urine: 1.014 (ref 1.005–1.030)
WBC, UA: >50 WBC/hpf — ABNORMAL HIGH (ref 0–5)
pH: 6 (ref 5.0–8.0)

## 2020-10-15 LAB — PROTIME-INR
INR: 1.1 (ref 0.8–1.2)
Prothrombin Time: 13.9 seconds (ref 11.4–15.2)

## 2020-10-15 LAB — C DIFFICILE QUICK SCREEN W PCR REFLEX
C Diff antigen: NEGATIVE
C Diff interpretation: NOT DETECTED
C Diff toxin: NEGATIVE

## 2020-10-15 LAB — LACTIC ACID, PLASMA
Lactic Acid, Venous: 0.9 mmol/L (ref 0.5–1.9)
Lactic Acid, Venous: 1.1 mmol/L (ref 0.5–1.9)

## 2020-10-15 LAB — MRSA PCR SCREENING: MRSA by PCR: NEGATIVE

## 2020-10-15 MED ORDER — POTASSIUM CHLORIDE CRYS ER 10 MEQ PO TBCR
20.0000 meq | EXTENDED_RELEASE_TABLET | Freq: Every day | ORAL | Status: DC
  Administered 2020-10-15 – 2020-10-23 (×9): 20 meq via ORAL
  Filled 2020-10-15 (×12): qty 2

## 2020-10-15 MED ORDER — DARUN-COBIC-EMTRICIT-TENOFAF 800-150-200-10 MG PO TABS
1.0000 | ORAL_TABLET | Freq: Every day | ORAL | Status: DC
  Administered 2020-10-16 – 2020-10-23 (×7): 1 via ORAL
  Filled 2020-10-15 (×9): qty 1

## 2020-10-15 MED ORDER — POTASSIUM CHLORIDE 10 MEQ/100ML IV SOLN
10.0000 meq | INTRAVENOUS | Status: AC
Start: 2020-10-15 — End: 2020-10-16
  Administered 2020-10-15 – 2020-10-16 (×2): 10 meq via INTRAVENOUS
  Filled 2020-10-15: qty 100

## 2020-10-15 MED ORDER — LACTATED RINGERS IV BOLUS
1000.0000 mL | Freq: Once | INTRAVENOUS | Status: AC
  Administered 2020-10-15: 1000 mL via INTRAVENOUS

## 2020-10-15 MED ORDER — ONDANSETRON HCL 4 MG/2ML IJ SOLN
4.0000 mg | Freq: Once | INTRAMUSCULAR | Status: AC
  Administered 2020-10-15: 4 mg via INTRAVENOUS
  Filled 2020-10-15: qty 2

## 2020-10-15 MED ORDER — POTASSIUM CHLORIDE 10 MEQ/100ML IV SOLN
10.0000 meq | INTRAVENOUS | Status: AC
  Administered 2020-10-15 (×2): 10 meq via INTRAVENOUS
  Filled 2020-10-15 (×2): qty 100

## 2020-10-15 MED ORDER — ENOXAPARIN SODIUM 40 MG/0.4ML ~~LOC~~ SOLN
40.0000 mg | SUBCUTANEOUS | Status: DC
  Filled 2020-10-15: qty 0.4

## 2020-10-15 MED ORDER — ACETAMINOPHEN 325 MG PO TABS
650.0000 mg | ORAL_TABLET | Freq: Four times a day (QID) | ORAL | Status: DC | PRN
  Administered 2020-10-16 – 2020-10-17 (×2): 650 mg via ORAL
  Filled 2020-10-15 (×2): qty 2

## 2020-10-15 MED ORDER — SODIUM CHLORIDE 0.9 % IV SOLN: 2.0000 g | Freq: Once | INTRAVENOUS | Status: DC

## 2020-10-15 MED ORDER — PANCRELIPASE (LIP-PROT-AMYL) 36000-114000 UNITS PO CPEP
36000.0000 [IU] | ORAL_CAPSULE | ORAL | Status: DC | PRN
  Filled 2020-10-15: qty 1

## 2020-10-15 MED ORDER — PANTOPRAZOLE SODIUM 40 MG IV SOLR
40.0000 mg | Freq: Once | INTRAVENOUS | Status: AC
  Administered 2020-10-15: 40 mg via INTRAVENOUS
  Filled 2020-10-15: qty 40

## 2020-10-15 MED ORDER — LACTATED RINGERS IV SOLN: INTRAVENOUS | Status: AC

## 2020-10-15 MED ORDER — METRONIDAZOLE 500 MG PO TABS
500.0000 mg | ORAL_TABLET | Freq: Three times a day (TID) | ORAL | Status: DC
  Administered 2020-10-15 – 2020-10-23 (×22): 500 mg via ORAL
  Filled 2020-10-15 (×22): qty 1

## 2020-10-15 MED ORDER — ONDANSETRON HCL 4 MG/2ML IJ SOLN
4.0000 mg | Freq: Four times a day (QID) | INTRAMUSCULAR | Status: DC | PRN
  Administered 2020-10-15: 4 mg via INTRAVENOUS
  Filled 2020-10-15: qty 2

## 2020-10-15 MED ORDER — LACTATED RINGERS IV SOLN: INTRAVENOUS | Status: DC

## 2020-10-15 MED ORDER — PANTOPRAZOLE SODIUM 40 MG PO TBEC
40.0000 mg | DELAYED_RELEASE_TABLET | Freq: Two times a day (BID) | ORAL | Status: DC
  Administered 2020-10-15 – 2020-10-16 (×2): 40 mg via ORAL
  Filled 2020-10-15 (×2): qty 1

## 2020-10-15 MED ORDER — SODIUM CHLORIDE 0.9 % IV SOLN
2.0000 g | INTRAVENOUS | Status: DC
  Administered 2020-10-15 – 2020-10-22 (×8): 2 g via INTRAVENOUS
  Filled 2020-10-15 (×8): qty 20

## 2020-10-15 MED ORDER — ALBUTEROL SULFATE (2.5 MG/3ML) 0.083% IN NEBU: 2.5000 mg | INHALATION_SOLUTION | Freq: Four times a day (QID) | RESPIRATORY_TRACT | Status: DC | PRN

## 2020-10-15 MED ORDER — PANCRELIPASE (LIP-PROT-AMYL) 36000-114000 UNITS PO CPEP
72000.0000 [IU] | ORAL_CAPSULE | Freq: Three times a day (TID) | ORAL | Status: DC
  Administered 2020-10-15 – 2020-10-23 (×17): 72000 [IU] via ORAL
  Filled 2020-10-15 (×26): qty 2

## 2020-10-15 MED ORDER — IOHEXOL 300 MG/ML  SOLN
100.0000 mL | Freq: Once | INTRAMUSCULAR | Status: AC | PRN
  Administered 2020-10-15: 100 mL via INTRAVENOUS

## 2020-10-15 NOTE — ED Notes (Signed)
Phlebotomy at bedside.

## 2020-10-15 NOTE — ED Provider Notes (Signed)
[facility] Provider Note   CSN: [id number] Arrival date & time: 10/15/20  1022     History No chief complaint on file.   Jacob Gutierrez is a 35 y.o. male.  HPI Jacob Gutierrez is a 35 y/o AA male with HIV-1 diagnosed in 2013 with initial CD4 count of 60. Risk factor for HIV is MSM. Initially started on Atripla with poor compliance developed K103N and M184V resistance. Transitioned to Stribild and Prezista.  He reports compliance with medications for the past month.  Patient reports for over a month now he has been having problems with persistent vomiting and diarrhea.  He reports this occurs on a daily basis.  He is not able to eat.  He has not seen any blood in the stool.  He does report seeing some blood at times when he vomits.  He reports he has fairly persistent abdominal pain.  It is diffuse and aching in quality.  No fevers.  Patient reports he is always very weak and fatigued.  He denies he has passed out but with standing feels like he will.  He reports he has had persistent cough with some shortness of breath.  Patient was seen in infectious disease clinic and referred to the emergency department for further evaluation    Past Medical History:  Diagnosis Date  . AIDS (acquired immune deficiency syndrome) (HCC)   . AIDS (acquired immune deficiency syndrome) (HCC)   . 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 H/O coccidioidomycosis    pulmonary   . HIV disease (HCC)   . Past history of allergy to penicillin-type antibiotic 07-03-2012   desensitization  . PNA (pneumonia)   . Shigella gastroenteritis   . Syphilis    history /treated     Patient Active Problem List   Diagnosis Date Noted  . COVID-19 virus infection 08/25/2020  . Dark urine 08/25/2020  . Healthcare maintenance 07/23/2020  . Pyelonephritis 02/13/2019  . Prostatitis 10/05/2018  . Leukopenia 09/20/2018  . Thrombocytopenia (HCC)  09/20/2018  . Hyponatremia 09/20/2018  . 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 Headache 04/27/2018  . Condyloma 04/24/2018  . Protein-calorie malnutrition, severe 12/12/2017  . Personal history of MRSA (methicillin resistant Staphylococcus aureus) 12/09/2017  . History of ESBL E. coli infection 12/09/2017  . Diarrhea 03/05/2017  . AIDS (acquired immune deficiency syndrome) (HCC)   . Homelessness   . 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 Tobacco use disorder 01/02/2014  . Human immunodeficiency virus (HIV) disease (HCC) 01/01/2014    Past Surgical History:  Procedure Laterality Date  . TRANSURETHRAL RESECTION OF PROSTATE N/A 12/09/2017   Procedure: TRANSURETHRAL RESECTION OF THE PROSTATE (TURP);  Surgeon: Crist FatHerrick, Benjamin W, MD;  Location: [facility];  Service: Urology;  Laterality: N/A;       Family History  Problem Relation Age of Onset  . Hypertension Mother   . Lupus Maternal Grandmother   . Diabetes Maternal Grandmother   . Cancer Paternal Grandfather        unknown   . Prostate cancer Father   . Diabetes Father   . Diabetes Maternal Grandfather   . Colon cancer Neg Hx   . Stomach cancer Neg Hx   . Esophageal cancer Neg Hx   . Pancreatic cancer Neg Hx     Social History   Tobacco Use  . Smoking status: Current Every Day Smoker    Packs/day: 0.30    Types: Cigarettes  . Smokeless  tobacco: Never Used  . Tobacco comment: cutting back  Vaping Use  . Vaping Use: Never used  Substance Use Topics  . Alcohol use: Not Currently    Alcohol/week: 1.0 standard drink    Types: 1 Standard drinks or equivalent per week    Comment: whiskey occasionally   . Drug use: Not Currently    Frequency: 1.0 times per week    Types: Marijuana    Home Medications Prior to Admission medications   Medication Sig Start Date End Date Taking? Authorizing Provider  Darunavir-Cobicisctat-Emtricitabine-Tenofovir Alafenamide (SYMTUZA) 800-150-200-10 MG TABS Take 1 tablet by mouth daily with breakfast.  07/08/20   Kuppelweiser, Cassie L, RPH-CPP  ferrous sulfate 324 (65 Fe) MG TBEC Take 1 tablet (325 mg total) by mouth daily after breakfast. 10/14/20   Anders Simmonds, PA-C  lipase/protease/amylase (CREON) 36000 UNITS CPEP capsule Take 2 capsules (72,000 units) by mouth WITH every meal, take 1 capsule (36,000 units) by mouth with every snack. 09/25/20   Unk Lightning, PA  loperamide (IMODIUM A-D) 2 MG tablet Take 1 tablet (2 mg total) by mouth every 8 (eight) hours. 09/18/20   Unk Lightning, PA  omeprazole (PRILOSEC) 20 MG capsule Take 1 capsule (20 mg total) by mouth 2 (two) times daily before a meal. Take one capsule 30-60 minutes before breakfast and dinner 09/18/20   Unk Lightning, PA  ondansetron (ZOFRAN-ODT) 8 MG disintegrating tablet Take 1 tablet (8 mg total) by mouth every 8 (eight) hours as needed for nausea or vomiting. 10/08/20   Anders Simmonds, PA-C  potassium chloride (KLOR-CON) 10 MEQ tablet Take 2 tablets (20 mEq total) by mouth daily. 10/14/20   Anders Simmonds, PA-C  sulfamethoxazole-trimethoprim (BACTRIM DS) 800-160 MG tablet Take 1 tablet by mouth daily. 07/08/20   Kuppelweiser, Cassie L, RPH-CPP    Allergies    Amoxicillin and Vancomycin  Review of Systems   Review of Systems 10 systems reviewed and negative except as per HPI Physical Exam Updated Vital Signs BP 106/75   Pulse 76   Temp 98.2 F (36.8 C) (Oral)   Resp 16   SpO2 98%   Physical Exam Constitutional:      Comments: Patient is cachectic and ill in appearance.  No respiratory distress.  Mental status is clear  HENT:     Mouth/Throat:     Mouth: Mucous membranes are moist.     Pharynx: Oropharynx is clear.  Eyes:     Extraocular Movements: Extraocular movements intact.  Cardiovascular:     Rate and Rhythm: Normal rate and regular rhythm.  Pulmonary:     Comments: No respiratory distress.  Breath sounds are very soft.  No gross wheeze rhonchi rale. Abdominal:      Comments: Abdomen is soft.  No guarding.  Diffusely tender to palpation.  Musculoskeletal:        General: Normal range of motion.     Comments: Extremities are cachectic with muscular atrophy.  Skin:    General: Skin is warm and dry.     Coloration: Skin is pale.  Neurological:     General: No focal deficit present.     Mental Status: He is oriented to person, place, and time.     Cranial Nerves: No cranial nerve deficit.     Comments: Patient is generally weak.  He does not have focal motor weakness.  He can follow commands.  Speech is clear and situationally appropriate  Psychiatric:     Comments: Affect  is flat.  Mood seems depressed     ED Results / Procedures / Treatments   Labs (all labs ordered are listed, but only abnormal results are displayed) Labs Reviewed  CBC - Abnormal; Notable for the following components:      Result Value   RBC 3.86 (*)    Hemoglobin 12.2 (*)    MCV 101.0 (*)    RDW 16.9 (*)    All other components within normal limits  COMPREHENSIVE METABOLIC PANEL - Abnormal; Notable for the following components:   Potassium 2.8 (*)    Calcium 8.5 (*)    Total Protein 8.5 (*)    Albumin 2.6 (*)    All other components within normal limits  URINALYSIS, ROUTINE W REFLEX MICROSCOPIC - Abnormal; Notable for the following components:   Color, Urine AMBER (*)    APPearance CLOUDY (*)    Hgb urine dipstick LARGE (*)    Protein, ur 100 (*)    Nitrite POSITIVE (*)    Leukocytes,Ua LARGE (*)    RBC / HPF >50 (*)    WBC, UA >50 (*)    Bacteria, UA MANY (*)    Non Squamous Epithelial 6-10 (*)    All other components within normal limits  I-STAT VENOUS BLOOD GAS, ED - Abnormal; Notable for the following components:   pO2, Ven 115.0 (*)    Bicarbonate 28.1 (*)    Potassium 2.9 (*)    Calcium, Ion 1.13 (*)    HCT 35.0 (*)    Hemoglobin 11.9 (*)    All other components within normal limits  CULTURE, BLOOD (ROUTINE X 2)  CULTURE, BLOOD (ROUTINE X 2)   GASTROINTESTINAL PANEL BY PCR, STOOL (REPLACES STOOL CULTURE)  C DIFFICILE QUICK SCREEN W PCR REFLEX  LACTIC ACID, PLASMA  LACTIC ACID, PLASMA  PROTIME-INR  BLOOD GAS, VENOUS  RAPID URINE DRUG SCREEN, HOSP PERFORMED  TROPONIN I (HIGH SENSITIVITY)  TROPONIN I (HIGH SENSITIVITY)    EKG None  Radiology CT Chest W Contrast  Result Date: 10/15/2020 CLINICAL DATA:  Nausea and vomiting. HIV. Persistent vomiting and diarrhea with abdominal pain for 1 month. Cough. Weakness. EXAM: CT CHEST, ABDOMEN, AND PELVIS WITH CONTRAST TECHNIQUE: Multidetector CT imaging of the chest, abdomen and pelvis was performed following the standard protocol during bolus administration of intravenous contrast. CONTRAST:  OMNIPAQUE IOHEXOL 300 MG/ML  SOLN COMPARISON:  Chest radiograph 08/07/2020. Abdominopelvic CT 02/20/2020. FINDINGS: CT CHEST FINDINGS Cardiovascular: Normal aortic caliber. Normal heart size, without pericardial effusion. No central pulmonary embolism, on this non-dedicated study. Mediastinum/Nodes: No mediastinal or hilar adenopathy. Subtle esophageal fluid level on 37/3. Mild esophageal wall thickening, especially superiorly on 16/3. Lungs/Pleura: No pleural fluid. Mild centrilobular and paraseptal emphysema. Scattered bilateral pulmonary nodules. Many of these, especially in the right upper lobe, are somewhat ill-defined and favored to be infectious/inflammatory. Examples in the right upper lobe on 77 and 84 of series 4. Clustered nodules are also identified within the posterior left upper lobe on 62/4 and 70/4. Other more well-circumscribed nodules are indeterminate. Example in the right lower lobe at 4 mm on 130/4 and the right upper lobe at 4 mm on 66/4. Musculoskeletal: No acute osseous abnormality. CT ABDOMEN PELVIS FINDINGS Hepatobiliary: Normal liver. Normal gallbladder, without biliary ductal dilatation. Pancreas: Normal, without mass or ductal dilatation. Spleen: Normal in size, without  focal abnormality. Adrenals/Urinary Tract: Normal adrenal glands. Subtle new uroepithelial mucosal thickening and mild hyperenhancement including within the renal pelvis on 84/3. No hydroureter or bladder  abnormality. No hydronephrosis or renal mass. Stomach/Bowel: Normal stomach, without wall thickening. Mild wall thickening and mucosal hyperenhancement within the left side of the colon, rectum, sigmoid. The transverse and ascending colon are underdistended, but appear moderately thick walled with mucosal hyperenhancement. The terminal ileum is also underdistended but likely thickened including on 104/3. Normal small bowel. Vascular/Lymphatic: Normal aortic caliber. Prominent abdominal retroperitoneal nodes are similar to 02/13/2019 and likely reactive in the setting of HIV. Reproductive: Normal prostate. Other: No significant free fluid.  No free intraperitoneal air. Musculoskeletal: No acute osseous abnormality. IMPRESSION: 1. Diffuse colonic wall thickening with mild mucosal hyperenhancement. This could represent infection (exclude C difficile) or inflammation. Given suspicion of concurrent terminal ileal wall thickening, Crohn disease or ulcerative colitis should be considered. 2. Scattered pulmonary opacities, primarily favored to represent mild atypical infection of indeterminate acuity. Other pulmonary nodules are technically indeterminate. No follow-up needed if patient is low-risk. Non-contrast chest CT can be considered in 12 months if patient is high-risk. This recommendation follows the consensus statement: Guidelines for Management of Incidental Pulmonary Nodules Detected on CT Images: From the Fleischner Society 2017; Radiology 2017; 284:228-243. 3. Esophageal wall thickening is mild, suggesting esophagitis. Esophageal air fluid level suggests dysmotility or gastroesophageal reflux. 4. Emphysema 5. Subtle urothelial mucosal hyperenhancement and wall thickening,primarily in the right renal pelvis.  Correlate with urinalysis to exclude ascending infection. Electronically Signed   By: Jeronimo Greaves M.D.   On: 10/15/2020 14:36   CT Abdomen Pelvis W Contrast  Result Date: 10/15/2020 CLINICAL DATA:  Nausea and vomiting. HIV. Persistent vomiting and diarrhea with abdominal pain for 1 month. Cough. Weakness. EXAM: CT CHEST, ABDOMEN, AND PELVIS WITH CONTRAST TECHNIQUE: Multidetector CT imaging of the chest, abdomen and pelvis was performed following the standard protocol during bolus administration of intravenous contrast. CONTRAST:  OMNIPAQUE IOHEXOL 300 MG/ML  SOLN COMPARISON:  Chest radiograph 08/07/2020. Abdominopelvic CT 02/20/2020. FINDINGS: CT CHEST FINDINGS Cardiovascular: Normal aortic caliber. Normal heart size, without pericardial effusion. No central pulmonary embolism, on this non-dedicated study. Mediastinum/Nodes: No mediastinal or hilar adenopathy. Subtle esophageal fluid level on 37/3. Mild esophageal wall thickening, especially superiorly on 16/3. Lungs/Pleura: No pleural fluid. Mild centrilobular and paraseptal emphysema. Scattered bilateral pulmonary nodules. Many of these, especially in the right upper lobe, are somewhat ill-defined and favored to be infectious/inflammatory. Examples in the right upper lobe on 77 and 84 of series 4. Clustered nodules are also identified within the posterior left upper lobe on 62/4 and 70/4. Other more well-circumscribed nodules are indeterminate. Example in the right lower lobe at 4 mm on 130/4 and the right upper lobe at 4 mm on 66/4. Musculoskeletal: No acute osseous abnormality. CT ABDOMEN PELVIS FINDINGS Hepatobiliary: Normal liver. Normal gallbladder, without biliary ductal dilatation. Pancreas: Normal, without mass or ductal dilatation. Spleen: Normal in size, without focal abnormality. Adrenals/Urinary Tract: Normal adrenal glands. Subtle new uroepithelial mucosal thickening and mild hyperenhancement including within the renal pelvis on 84/3. No  hydroureter or bladder abnormality. No hydronephrosis or renal mass. Stomach/Bowel: Normal stomach, without wall thickening. Mild wall thickening and mucosal hyperenhancement within the left side of the colon, rectum, sigmoid. The transverse and ascending colon are underdistended, but appear moderately thick walled with mucosal hyperenhancement. The terminal ileum is also underdistended but likely thickened including on 104/3. Normal small bowel. Vascular/Lymphatic: Normal aortic caliber. Prominent abdominal retroperitoneal nodes are similar to 02/13/2019 and likely reactive in the setting of HIV. Reproductive: Normal prostate. Other: No significant free fluid.  No free intraperitoneal air.  Musculoskeletal: No acute osseous abnormality. IMPRESSION: 1. Diffuse colonic wall thickening with mild mucosal hyperenhancement. This could represent infection (exclude C difficile) or inflammation. Given suspicion of concurrent terminal ileal wall thickening, Crohn disease or ulcerative colitis should be considered. 2. Scattered pulmonary opacities, primarily favored to represent mild atypical infection of indeterminate acuity. Other pulmonary nodules are technically indeterminate. No follow-up needed if patient is low-risk. Non-contrast chest CT can be considered in 12 months if patient is high-risk. This recommendation follows the consensus statement: Guidelines for Management of Incidental Pulmonary Nodules Detected on CT Images: From the Fleischner Society 2017; Radiology 2017; 284:228-243. 3. Esophageal wall thickening is mild, suggesting esophagitis. Esophageal air fluid level suggests dysmotility or gastroesophageal reflux. 4. Emphysema 5. Subtle urothelial mucosal hyperenhancement and wall thickening,primarily in the right renal pelvis. Correlate with urinalysis to exclude ascending infection. Electronically Signed   By: Jeronimo Greaves M.D.   On: 10/15/2020 14:36    Procedures Procedures  CRITICAL CARE Performed by:  Arby Barrette   Total critical care time: 30 minutes  Critical care time was exclusive of separately billable procedures and treating other patients.  Critical care was necessary to treat or prevent imminent or life-threatening deterioration.  Critical care was time spent personally by me on the following activities: development of treatment plan with patient and/or surrogate as well as nursing, discussions with consultants, evaluation of patient's response to treatment, examination of patient, obtaining history from patient or surrogate, ordering and performing treatments and interventions, ordering and review of laboratory studies, ordering and review of radiographic studies, pulse oximetry and re-evaluation of patient's condition. Medications Ordered in ED Medications  potassium chloride 10 mEq in 100 mL IVPB (0 mEq Intravenous Stopped 10/15/20 1536)  lactated ringers infusion (has no administration in time range)  cefTRIAXone (ROCEPHIN) 2 g in sodium chloride 0.9 % 100 mL IVPB (has no administration in time range)  lactated ringers bolus 1,000 mL (1,000 mLs Intravenous New Bag/Given 10/15/20 1334)  ondansetron (ZOFRAN) injection 4 mg (4 mg Intravenous Given 10/15/20 1352)  pantoprazole (PROTONIX) injection 40 mg (40 mg Intravenous Given 10/15/20 1339)  iohexol (OMNIPAQUE) 300 MG/ML solution 100 mL (100 mLs Intravenous Contrast Given 10/15/20 1359)    ED Course  I have reviewed the triage vital signs and the nursing notes.  Pertinent labs & imaging results that were available during my care of the patient were reviewed by me and considered in my medical decision making (see chart for details).    MDM Rules/Calculators/A&P                          Consult: Dr. Katrinka Blazing for admission.  Patient presents with persistent and chronic vomiting and diarrhea.  He is cachectic and clinically appears dehydrated.  Potassium was 2.8.  Will initiate potassium replacement and fluid replacement.   Additional diagnostic evaluation pending.  We will proceed with CT scan of chest and abdomen for persistent symptoms of cough and vomiting and diarrhea in HIV patient with apparent progression to AIDS.  He does report compliance with current medication regimen.  Anticipate patient will require admission.  Findings consistent with dehydration and hypovolemia.  Urinalysis is grossly positive.  Will start Rocephin IV.  CT scan show multiple findings concerning for colitis and possible pneumonia.  Blood cultures have been obtained.  Fluid resuscitation initiated.  Antibiotics initiated.  Concern for Sirs\early sepsis in patient with HIV/AIDS.  Patient's mental status is clear.  No respiratory distress.  Consult: Dr.  Smith for admission  Final Clinical Impression(s) / ED Diagnoses Final diagnoses:  Hypokalemia  Vomiting and diarrhea  Dehydration  Urinary tract infection with hematuria, site unspecified  AIDS (acquired immune deficiency syndrome) (HCC)    Rx / DC Orders ED Discharge Orders    None       Arby Barrette, MD 10/16/20 [id number]

## 2020-10-15 NOTE — Progress Notes (Signed)
   10/15/20 1901  Vitals  Temp 97.8 F (36.6 C)  Temp Source Axillary  BP 116/75  BP Location Right Arm  BP Method Automatic  Patient Position (if appropriate) Sitting  Pulse Rate 82  Pulse Rate Source Monitor  Resp 18  Level of Consciousness  Level of Consciousness Alert  MEWS COLOR  MEWS Score Color Green  Oxygen Therapy  SpO2 96 %  O2 Device Room Air   Admitted from ED. Updated on plan of care.

## 2020-10-15 NOTE — Progress Notes (Signed)
Brief Narrative   Patient ID: Jacob Gutierrez, male    DOB: Jan 20, 1986, 35 y.o.   MRN: [id number]  Jacob Gutierrez is a 35 y/o AA male with HIV-1 diagnosed in 2013 with initial CD4 count of 60. Risk factor for HIV is MSM. Initially started on Atripla with poor compliance developed K103N and M184V resistance. Transitioned to Stribild and Prezista.   Subjective:    Chief Complaint  Patient presents with  . Follow-up    Reports nausea, vomiting and diarrhea for the last month; can't keep anything down; has an appointment with GI next week per patient. Reports severe sore throat and having to "force things down" in order to eat/drink. Patient reports taking his medication      HPI:  Jacob Gutierrez is a 35 y.o. male with AIDS last seen on on 08/25/20 reportedly taking his Symtuza and Bactrim which was not substantiated by his blood work with viral load of 449,000 and CD4 count <35. Seen by GI on 2/25 with watery stools and feelings like he has something getting stuck in his chest when eating/drinking. Recommended for possible EGD and colonoscopy with rule out of infectious causes. Was treated with 7 days of azithromycin. Subsequently seen by Family Medicine on 3/17 with worsening symptoms and recommended EGD and colonoscopy and started on Omeprazole and Imodium.   Jacob Gutierrez has been taking his Symtuza and Bactrim as prescribed with occasionally missed doses. Not feeling well and been dizzy feeling like he is going to pass out at times. Continues to have watery diarrhea and vomiting not being able to keep down any food or water which has now been going on for about 1 month. Experienced weight loss from 161 lbs on 2/25 down to 143 lbs today. Previous medications have not helped with his symptoms. Noted white patches in his mouth and throat.  Denies fevers, chills, night sweats, headaches, changes in vision, neck pain/stiffness, lesions or rashes.  Jacob Gutierrez had not picked up his medication from  the pharmacy staff here at [facility] and noted it was filled a couple of days ago. Denies feelings of being down, depressed or hopeless. No recreational or illicit drug use, tobacco use or alcohol consumption.    Allergies  Allergen Reactions  . Amoxicillin Other (See Comments)    From childhood: "I had a reaction when i was little." (??)  . Vancomycin Itching, Swelling and Other (See Comments)    Angioedema, also      No facility-administered medications prior to visit.   Outpatient Medications Prior to Visit  Medication Sig Dispense Refill  . Darunavir-Cobicisctat-Emtricitabine-Tenofovir Alafenamide (SYMTUZA) 800-150-200-10 MG TABS Take 1 tablet by mouth daily with breakfast. 30 tablet 5  . sulfamethoxazole-trimethoprim (BACTRIM DS) 800-160 MG tablet Take 1 tablet by mouth daily. 30 tablet 5  . ferrous sulfate 324 (65 Fe) MG TBEC Take 1 tablet (325 mg total) by mouth daily after breakfast. 100 tablet 1  . lipase/protease/amylase (CREON) 36000 UNITS CPEP capsule Take 2 capsules (72,000 units) by mouth WITH every meal, take 1 capsule (36,000 units) by mouth with every snack. 240 capsule 11  . loperamide (IMODIUM A-D) 2 MG tablet Take 1 tablet (2 mg total) by mouth every 8 (eight) hours. 30 tablet 5  . omeprazole (PRILOSEC) 20 MG capsule Take 1 capsule (20 mg total) by mouth 2 (two) times daily before a meal. Take one capsule 30-60 minutes before breakfast and dinner 60 capsule 5  . ondansetron (ZOFRAN-ODT) 8 MG disintegrating tablet  Take 1 tablet (8 mg total) by mouth every 8 (eight) hours as needed for nausea or vomiting. 30 tablet 0  . potassium chloride (KLOR-CON) 10 MEQ tablet Take 2 tablets (20 mEq total) by mouth daily. 60 tablet 1     Past Medical History:  Diagnosis Date  . AIDS (acquired immune deficiency syndrome) (HCC)   . AIDS (acquired immune deficiency syndrome) (HCC)   . 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 H/O coccidioidomycosis     pulmonary   . HIV disease (HCC)   . Past history of allergy to penicillin-type antibiotic 07-03-2012   desensitization  . PNA (pneumonia)   . Shigella gastroenteritis   . Syphilis    history /treated      Past Surgical History:  Procedure Laterality Date  . TRANSURETHRAL RESECTION OF PROSTATE N/A 12/09/2017   Procedure: TRANSURETHRAL RESECTION OF THE PROSTATE (TURP);  Surgeon: Crist Fat, MD;  Location: [facility];  Service: Urology;  Laterality: N/A;       Review of Systems  Constitutional: Positive for appetite change, fatigue and unexpected weight change. Negative for chills and fever.  HENT: Positive for sore throat.   Eyes: Negative for visual disturbance.  Respiratory: Negative for cough, chest tightness, shortness of breath and wheezing.   Cardiovascular: Negative for chest pain and leg swelling.  Gastrointestinal: Positive for abdominal pain, diarrhea, nausea and vomiting. Negative for constipation.  Genitourinary: Negative for dysuria, flank pain, frequency, genital sores, hematuria and urgency.  Skin: Negative for rash.  Allergic/Immunologic: Negative for immunocompromised state.  Neurological: Negative for dizziness and headaches.      Objective:    BP 97/61   Pulse 96   Temp 98.1 F (36.7 C) (Oral)   Wt 143 lb (64.9 kg)   BMI 18.87 kg/m  Nursing note and vital signs reviewed.  Physical Exam Constitutional:      General: He is not in acute distress.    Appearance: He is well-developed. He is ill-appearing.  Cardiovascular:     Rate and Rhythm: Regular rhythm. Tachycardia present.     Heart sounds: Normal heart sounds.  Pulmonary:     Effort: Pulmonary effort is normal.     Breath sounds: Normal breath sounds.  Abdominal:     General: Bowel sounds are normal. There is no distension.     Palpations: There is no mass.     Tenderness: There is generalized abdominal tenderness. There is no guarding.     Hernia: No hernia is present.  Skin:     General: Skin is warm and dry.  Neurological:     Mental Status: He is alert and oriented to person, place, and time.  Psychiatric:        Behavior: Behavior normal.        Thought Content: Thought content normal.        Judgment: Judgment normal.      Depression screen [facility] 2/9 10/15/2020 10/08/2020 07/23/2020 10/24/2018 10/04/2018  Decreased Interest 0 0 0 0 0  Down, Depressed, Hopeless 1 0 0 0 0  PHQ - 2 Score 1 0 0 0 0  Altered sleeping - 3 - - -  Tired, decreased energy - 3 - - -  Change in appetite - 0 - - -  Feeling bad or failure about yourself  - 0 - - -  Trouble concentrating - 0 - - -  Moving slowly or fidgety/restless - 0 - - -  Suicidal thoughts -  0 - - -  PHQ-9 Score - 6 - - -       Assessment & Plan:    Patient Active Problem List   Diagnosis Date Noted  . COVID-19 virus infection 08/25/2020  . Dark urine 08/25/2020  . Healthcare maintenance 07/23/2020  . Pyelonephritis 02/13/2019  . Prostatitis 10/05/2018  . Leukopenia 09/20/2018  . Thrombocytopenia (HCC) 09/20/2018  . Hyponatremia 09/20/2018  . 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 Headache 04/27/2018  . Condyloma 04/24/2018  . Protein-calorie malnutrition, severe 12/12/2017  . Personal history of MRSA (methicillin resistant Staphylococcus aureus) 12/09/2017  . History of ESBL E. coli infection 12/09/2017  . Diarrhea 03/05/2017  . AIDS (acquired immune deficiency syndrome) (HCC)   . Homelessness   . 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 Tobacco use disorder 01/02/2014  . Human immunodeficiency virus (HIV) disease (HCC) 01/01/2014     Problem List Items Addressed This Visit      Other   Diarrhea    Previous fecal occult, lactoferrin and Shigella were positive on 2/25 which should have resolved. Cause of diarrhea is unclear although likely multifactorial including poor control of AIDS virus. GI recommend EGD and colonoscopy. Abdominal exam with generalized tenderness. Recommend further evaluation in the ED as he  clinically appears to be dehydrated and likely has electrolyte imbalance.       AIDS (acquired immune deficiency syndrome) (HCC) - Primary    Jacob Gutierrez AIDS virus remains poorly controlled based on previous lab work showing viral load of 449,000 and CD4 count of <35 placing him at significant risk for opportunistic infection. Based on not picking up medications earlier in the month it is unlikely that he has been taking his medications as prescribed. There was no significant thrush seen in oral phalanx however would suspect it is likely causing some of his current symptoms. Poorly controlled AIDS may also be contributing to his diarrhea. Previous CMV negative and does not currently have symptoms consistent with cryptococcus. Appears to be significantly dehydrated and likely malnourished with chronic diarrhea and vomiting at this point. Recommend further evaluation in the ED. Will need genotype to determine if there is new resistance that would explain possible failure of Symtuza. Inpatient ID team made aware of likely pending admission.           I am having Lillie Columbia maintain his Symtuza, sulfamethoxazole-trimethoprim, omeprazole, loperamide, lipase/protease/amylase, ondansetron, potassium chloride, and ferrous sulfate.   Follow-up: Following evaluation from ED / likely admission   Marcos Eke, MSN, FNP-C Nurse Practitioner [facility] Main number: [phone]

## 2020-10-15 NOTE — Consult Note (Signed)
Date of Admission:  10/15/2020          Reason for Consult: Nausea vomiting and in particular diarrhea and patient with uncontrolled HIV and AIDS     Referring Provider: Dr Katrinka Blazing   Assessment:  1. Nausea vomiting and diarrhea with trouble swallowing and CT scan showing 2. Colitis _C. difficile negative stool 3. Esophagitis 4. 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 HIV and AIDS due to nonadherence to therapy over many years 6. Cachexia 7. Dehydration  Plan:  1. Reasonable to give ceftriaxone and metronidazole for empiric treatment of colitis 2. I will send AFB blood cultures and we will follow up the GI pathogen panel 3. I would consult GI who are planning on doing an upper endoscopy to evaluate the cause of his esophagitis which could certainly be due to to Candida versus herpes simplex versus CMV versus non infectious aphthous ulcerative esophagitis 4. He may also need endoscopy to workup his colitis if it does not respond to empiric antibiotics 5. I am sending a serum cryptococcal antigen urine histo and Blastomyces antigens to work-up his pulmonary nodules 6. Will check HIV RNA and genotype 7. Can resume his Symtuza though he clearly does not take it reliably 8. May consider Palliative care consult but I will talk with Marcos Eke re this  Active Problems:   Hypokalemia   Scheduled Meds: . enoxaparin (LOVENOX) injection  40 mg Subcutaneous Q24H  . metroNIDAZOLE  500 mg Oral Q8H   Continuous Infusions: . cefTRIAXone (ROCEPHIN)  IV    . lactated ringers    . potassium chloride 10 mEq (10/15/20 1541)   PRN Meds:.albuterol  HPI: Jacob Gutierrez is a 34 y.o. male living with HIV who has AIDS and has not been adherent to his antiretroviral therapy for most of the time he has been care in [place].  Presented to clinic today with symptoms of several weeks of nausea and vomiting difficulty  swallowing as well as copious diarrhea having bowel movements every few minutes "I cannot keep count of have any bowel movements I have and I just had 3 since I have been here.  He has been seen by GI on 25 February with watery stools and feeling like something is getting stuck in his chest while eating and drinking.  He was recommended for EGD and colonoscopy and started on omeprazole.  He said he stopped the omeprazole though because he said it was contraindicated with one of his medications.  His stool was positive in February for Shigella/enteroinvasive coli.  He was Shiga toxin negative and lactoferrin positive.  He also received 7 days of azithromycin.  He has been seen in the ER is being admitted to the hospitalist service.  His C. difficile test is negative and his GI pathogen panel is pending.  He had a CT of the chest abdomen pelvis performed.  CT of the chest showed nodules throughout the lungs which could be consistent with an opportunistic infection such as a dimorphic fungus.  CT of the abdomen pelvis shows thickening of the colon consistent with colitis.  There are some subtle finding in the urothelial lining which I do not think is of any consequence.  He has continued to lose weight and is cachectic.  Again I think is reasonable to give him ceftriaxone and metronidazole for empiric therapy of colitis.  We will follow up the GI pathogen panel I am also getting  an AFB blood culture in Case he has disseminated Mycobacterium avium infection.  We will check a CMV DNA in the blood though this is not going to rule in or rule out invasive disease at the site of endorgan damage.  With regards to his dysphagia I think he clearly needs an upper endoscopy given that he could have multiple causes of his dysphagia.  He does not have obvious thrush on exam.  If he has an upper endoscopy tissue should be sent for herpes simplex PCR and CMV PCR in a sterile container that does not  have any fixative is or preservatives that will interfere with the PCR assay.  He might require lower endoscopy as well if he does not respond to treatment.  With regards to his nodules in the lungs I will send a serum cryptococcal antigen as well as urine histoplasma Blastomyces antigen.  We will recheck his HIV viral load and genotype CD4 count.  Can resume SYMTUZA though he is shown a pattern of consistently not taking medications.  I began to wonder if it may be beneficial to have palliative care see him at some point during this hospitalization.  Also wonder about sending him out without antiretroviral since he has not shown consistent willingness or ability to take medications.  When I talked to him today he says that he does try to take his medications and just occasionally misses them but this is clearly not consistent with his high viral loads that are in the 100s of thousands of not millions of copies of HIV    Review of Systems: Review of Systems  Constitutional: Positive for malaise/fatigue and weight loss. Negative for chills and fever.  HENT: Negative for congestion and sore throat.   Eyes: Negative for blurred vision and photophobia.  Respiratory: Negative for cough, shortness of breath and wheezing.   Cardiovascular: Negative for chest pain, palpitations and leg swelling.  Gastrointestinal: Positive for abdominal pain, diarrhea, nausea and vomiting. Negative for blood in stool, constipation, heartburn and melena.  Genitourinary: Negative for dysuria, flank pain and hematuria.  Musculoskeletal: Negative for back pain, falls, joint pain and myalgias.  Skin: Negative for itching and rash.  Neurological: Negative for dizziness, focal weakness, loss of consciousness, weakness and headaches.  Endo/Heme/Allergies: Does not bruise/bleed easily.  Psychiatric/Behavioral: Positive for depression. Negative for suicidal ideas. The patient does not have insomnia.     Past Medical  History:  Diagnosis Date  . AIDS (acquired immune deficiency syndrome) (HCC)   . AIDS (acquired immune deficiency syndrome) (HCC)   . 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 H/O coccidioidomycosis    pulmonary   . HIV disease (HCC)   . Past history of allergy to penicillin-type antibiotic 07-03-2012   desensitization  . PNA (pneumonia)   . Shigella gastroenteritis   . Syphilis    history /treated     Social History   Tobacco Use  . Smoking status: Current Every Day Smoker    Packs/day: 0.30    Types: Cigarettes  . Smokeless tobacco: Never Used  . Tobacco comment: cutting back  Vaping Use  . Vaping Use: Never used  Substance Use Topics  . Alcohol use: Not Currently    Alcohol/week: 1.0 standard drink    Types: 1 Standard drinks or equivalent per week    Comment: whiskey occasionally   . Drug use: Not Currently    Frequency: 1.0 times per week    Types: Marijuana  Family History  Problem Relation Age of Onset  . Hypertension Mother   . Lupus Maternal Grandmother   . Diabetes Maternal Grandmother   . Cancer Paternal Grandfather        unknown   . Prostate cancer Father   . Diabetes Father   . Diabetes Maternal Grandfather   . Colon cancer Neg Hx   . Stomach cancer Neg Hx   . Esophageal cancer Neg Hx   . Pancreatic cancer Neg Hx    Allergies  Allergen Reactions  . Amoxicillin Other (See Comments)    From childhood: "I had a reaction when i was little." (??)  . Vancomycin Itching, Swelling and Other (See Comments)    Angioedema, also    OBJECTIVE: Blood pressure 106/75, pulse 76, temperature 98.2 F (36.8 C), temperature source Oral, resp. rate 16, SpO2 98 %.  Physical Exam Constitutional:      Appearance: He is cachectic. He is ill-appearing.  HENT:     Head: Normocephalic and atraumatic.     Mouth/Throat:     Mouth: Mucous membranes are moist.     Pharynx: Oropharynx is clear. Uvula midline. No pharyngeal  swelling, oropharyngeal exudate, posterior oropharyngeal erythema or uvula swelling.     Tonsils: No tonsillar exudate or tonsillar abscesses.  Cardiovascular:     Rate and Rhythm: Tachycardia present.     Heart sounds: No murmur heard. No friction rub. No gallop.   Pulmonary:     Effort: Pulmonary effort is normal. No respiratory distress.     Breath sounds: No stridor. No rhonchi.  Abdominal:     General: Bowel sounds are normal.     Tenderness: There is abdominal tenderness.  Skin:    General: Skin is warm.  Neurological:     General: No focal deficit present.     Mental Status: He is alert and oriented to person, place, and time.  Psychiatric:        Attention and Perception: Attention normal.        Mood and Affect: Affect is labile.        Speech: Speech normal.        Behavior: Behavior is cooperative.        Thought Content: Thought content normal.        Cognition and Memory: Cognition normal.     Lab Results Lab Results  Component Value Date   WBC 4.1 10/15/2020   HGB 11.9 (L) 10/15/2020   HCT 35.0 (L) 10/15/2020   MCV 101.0 (H) 10/15/2020   PLT 218 10/15/2020    Lab Results  Component Value Date   CREATININE 1.01 10/15/2020   BUN 12 10/15/2020   NA 138 10/15/2020   K 2.9 (L) 10/15/2020   CL 102 10/15/2020   CO2 25 10/15/2020    Lab Results  Component Value Date   ALT 16 10/15/2020   AST 32 10/15/2020   ALKPHOS 60 10/15/2020   BILITOT 0.7 10/15/2020     Microbiology: Recent Results (from the past 240 hour(s))  C Difficile Quick Screen w PCR reflex     Status: None   Collection Time: 10/15/20  2:04 PM   Specimen: Stool  Result Value Ref Range Status   C Diff antigen NEGATIVE NEGATIVE Final   C Diff toxin NEGATIVE NEGATIVE Final   C Diff interpretation No C. difficile detected.  Final    Comment: Performed at [facility], [address], [place], [place] [address]    Paulette Blanch  Dam, MD [facility] [phone] pager  10/15/2020, 4:10 PM

## 2020-10-15 NOTE — Assessment & Plan Note (Signed)
Previous fecal occult, lactoferrin and Shigella were positive on 2/25 which should have resolved. Cause of diarrhea is unclear although likely multifactorial including poor control of AIDS virus. GI recommend EGD and colonoscopy. Abdominal exam with generalized tenderness. Recommend further evaluation in the ED as he clinically appears to be dehydrated and likely has electrolyte imbalance.

## 2020-10-15 NOTE — Assessment & Plan Note (Addendum)
Jacob Gutierrez AIDS virus remains poorly controlled based on previous lab work showing viral load of 449,000 and CD4 count of <35 placing him at significant risk for opportunistic infection. Based on not picking up medications earlier in the month it is unlikely that he has been taking his medications as prescribed. There was no significant thrush seen in oral phalanx however would suspect it is likely causing some of his current symptoms. Poorly controlled AIDS may also be contributing to his diarrhea. Previous CMV negative and does not currently have symptoms consistent with cryptococcus. Appears to be significantly dehydrated and likely malnourished with chronic diarrhea and vomiting at this point. Recommend further evaluation in the ED. Will need genotype to determine if there is new resistance that would explain possible failure of Symtuza. Inpatient ID team made aware of likely pending admission.

## 2020-10-15 NOTE — ED Notes (Signed)
IV team at bedside for US guided IV access.

## 2020-10-15 NOTE — ED Notes (Signed)
Patient refused IV team's further attempt at IV access. Patient understands that this will delay IV medication administration.

## 2020-10-15 NOTE — ED Triage Notes (Signed)
Pt here from ID with c/o n/v/d times 1 month , slight cough , no fevers just feels weak

## 2020-10-15 NOTE — ED Notes (Signed)
Pt provided with paper scrub bottoms per request and wash cloths. Pt ambulatory with steady gait to bathroom.

## 2020-10-15 NOTE — H&P (Signed)
History and Physical    Jacob Gutierrez VOZ:[id number] DOB: 18-Aug-1985 DOA: 10/15/2020  Referring MD/NP/PA Odette Horns, MD PCP: Patient, No Pcp Per  [facility]  Chief Complaint: Nausea, vomiting, and diarrhea  I have personally briefly reviewed patient's old medical records in [facility]   HPI: Jacob Gutierrez is a 35 y.o. male with medical history significant of AIDS(last CD4 count < 35) with prior infections include Shigella gastroenteritis, syphilis, coccidiomycosis, and recent COVID-19 infection in 07/2020 presents with complaints of nausea, vomiting, and diarrhea for the last month.  Patient reports feels like stuff gets stuck in his chest when eating and drinking causing him to vomit.  Emesis intermittently all have specks of blood present.  His stools have been watery and intermittently seeing bright red blood present.  He had been seen by [facility] sometime last week in plan to do EGD and colonoscopy 3/28.  Patient is currently been taking Stribild and Prezista, but reports that he has likely missed some doses with nausea and vomiting.  Exam with sent to the hospital further evaluation.  Does report patient has lost weight about 15-18 pounds over the last 1 month along with left-sided abdominal pain.  ED Course: Upon admission into the emergency department patient was noted to be afebrile with respirations 11-23, and all other vital signs maintained.  Labs significant for hemoglobin 12.2, MCV 101, potassium 2.8, and albumin 2.6.  Urinalysis was positive for large leukocytes positive nitrates, many bacteria, greater than 50 WBCs.  CT scan of the chest, abdomen, and pelvis revealed diffuse thickening of the whole colon concerning for colitis as well scattered pulmonary opacities concerning mild atypical infection.  Patient had been given 4 mEq of potassium chloride IV, Protonix 40 mg IV, Zofran, and lactic Ringer IV fluids.  Review of Systems  Constitutional:  Positive for malaise/fatigue. Negative for fever.  HENT: Negative for congestion and nosebleeds.   Eyes: Negative for photophobia and pain.  Respiratory: Positive for cough.   Cardiovascular: Negative for leg swelling.  Gastrointestinal: Positive for abdominal pain, blood in stool, diarrhea, nausea and vomiting.  Genitourinary: Negative for frequency and hematuria.  Neurological: Negative for focal weakness.  Psychiatric/Behavioral: Positive for substance abuse.  All other systems reviewed and are negative.   Past Medical History:  Diagnosis Date  . AIDS (acquired immune deficiency syndrome) (HCC)   . AIDS (acquired immune deficiency syndrome) (HCC)   . 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 H/O coccidioidomycosis    pulmonary   . HIV disease (HCC)   . Past history of allergy to penicillin-type antibiotic 07-03-2012   desensitization  . PNA (pneumonia)   . Shigella gastroenteritis   . Syphilis    history /treated     Past Surgical History:  Procedure Laterality Date  . TRANSURETHRAL RESECTION OF PROSTATE N/A 12/09/2017   Procedure: TRANSURETHRAL RESECTION OF THE PROSTATE (TURP);  Surgeon: Crist Fat, MD;  Location: [facility];  Service: Urology;  Laterality: N/A;     reports that he has been smoking cigarettes. He has been smoking about 0.30 packs per day. He has never used smokeless tobacco. He reports previous alcohol use of about 1.0 standard drink of alcohol per week. He reports previous drug use. Frequency: 1.00 time per week. Drug: Marijuana.  Allergies  Allergen Reactions  . Amoxicillin Other (See Comments)    From childhood: "I had a reaction when i was little." (??)  . Vancomycin  Itching, Swelling and Other (See Comments)    Angioedema, also    Family History  Problem Relation Age of Onset  . Hypertension Mother   . Lupus Maternal Grandmother   . Diabetes Maternal Grandmother   . Cancer Paternal Grandfather         unknown   . Prostate cancer Father   . Diabetes Father   . Diabetes Maternal Grandfather   . Colon cancer Neg Hx   . Stomach cancer Neg Hx   . Esophageal cancer Neg Hx   . Pancreatic cancer Neg Hx     Prior to Admission medications   Medication Sig Start Date End Date Taking? Authorizing Provider  Darunavir-Cobicisctat-Emtricitabine-Tenofovir Alafenamide (SYMTUZA) 800-150-200-10 MG TABS Take 1 tablet by mouth daily with breakfast. 07/08/20   Kuppelweiser, Cassie L, RPH-CPP  ferrous sulfate 324 (65 Fe) MG TBEC Take 1 tablet (325 mg total) by mouth daily after breakfast. 10/14/20   Anders Simmonds, PA-C  lipase/protease/amylase (CREON) 36000 UNITS CPEP capsule Take 2 capsules (72,000 units) by mouth WITH every meal, take 1 capsule (36,000 units) by mouth with every snack. 09/25/20   Unk Lightning, PA  loperamide (IMODIUM A-D) 2 MG tablet Take 1 tablet (2 mg total) by mouth every 8 (eight) hours. 09/18/20   Unk Lightning, PA  omeprazole (PRILOSEC) 20 MG capsule Take 1 capsule (20 mg total) by mouth 2 (two) times daily before a meal. Take one capsule 30-60 minutes before breakfast and dinner 09/18/20   Unk Lightning, PA  ondansetron (ZOFRAN-ODT) 8 MG disintegrating tablet Take 1 tablet (8 mg total) by mouth every 8 (eight) hours as needed for nausea or vomiting. 10/08/20   Anders Simmonds, PA-C  potassium chloride (KLOR-CON) 10 MEQ tablet Take 2 tablets (20 mEq total) by mouth daily. 10/14/20   Anders Simmonds, PA-C  sulfamethoxazole-trimethoprim (BACTRIM DS) 800-160 MG tablet Take 1 tablet by mouth daily. 07/08/20   Kuppelweiser, Cassie L, RPH-CPP    Physical Exam:  Constitutional: Cachectic appearing middle-aged male who appears ill Vitals:   10/15/20 1230 10/15/20 1418 10/15/20 1445 10/15/20 1500  BP: 107/71 109/70 113/72 106/75  Pulse: 80   76  Resp: (!) Temp:      TempSrc:      SpO2: 98%   98%   Eyes: PERRL, lids and conjunctivae normal ENMT:  Mucous membranes are dry.   Neck: normal, supple, no masses, no thyromegaly Respiratory: clear to auscultation bilaterally, no wheezing, no crackles. Normal respiratory effort. No accessory muscle use.  Cardiovascular: Regular rate and rhythm, no murmurs / rubs / gallops. No extremity edema. 2+ pedal pulses. No carotid bruits.  Abdomen: Tenderness palpation on the left quadrant of the abdomen.  Bowel sounds present. Musculoskeletal: no clubbing / cyanosis. No joint deformity upper and lower extremities. Good ROM, no contractures. Normal muscle tone.  Skin: no rashes, lesions, ulcers. No induration Neurologic: CN 2-12 grossly intact. Sensation intact, DTR normal. Strength 5/5 in all 4.  Psychiatric: Normal judgment and insight. Alert and oriented x 3. Normal mood.     Labs on Admission: I have personally reviewed following labs and imaging studies  CBC: Recent Labs  Lab 10/15/20 1036 10/15/20 1252  WBC 4.1  --   HGB 12.2* 11.9*  HCT 39.0 35.0*  MCV 101.0*  --   PLT 218  --    Basic Metabolic Panel: Recent Labs  Lab 10/15/20 1036 10/15/20 1252  NA 137 138  K 2.8*  2.9*  CL 102  --   CO2 25  --   GLUCOSE 91  --   BUN 12  --   CREATININE 1.01  --   CALCIUM 8.5*  --    GFR: Estimated Creatinine Clearance: 91.9 mL/min (by C-G formula based on SCr of 1.01 mg/dL). Liver Function Tests: Recent Labs  Lab 10/15/20 1036  AST 32  ALT 16  ALKPHOS 60  BILITOT 0.7  PROT 8.5*  ALBUMIN 2.6*   No results for input(s): LIPASE, AMYLASE in the last 168 hours. No results for input(s): AMMONIA in the last 168 hours. Coagulation Profile: Recent Labs  Lab 10/15/20 1220  INR 1.1   Cardiac Enzymes: No results for input(s): CKTOTAL, CKMB, CKMBINDEX, TROPONINI in the last 168 hours. BNP (last 3 results) No results for input(s): PROBNP in the last 8760 hours. HbA1C: No results for input(s): HGBA1C in the last 72 hours. CBG: No results for input(s): GLUCAP in the last 168  hours. Lipid Profile: No results for input(s): CHOL, HDL, LDLCALC, TRIG, CHOLHDL, LDLDIRECT in the last 72 hours. Thyroid Function Tests: No results for input(s): TSH, T4TOTAL, FREET4, T3FREE, THYROIDAB in the last 72 hours. Anemia Panel: No results for input(s): VITAMINB12, FOLATE, FERRITIN, TIBC, IRON, RETICCTPCT in the last 72 hours. Urine analysis:    Component Value Date/Time   COLORURINE AMBER (A) 10/15/2020 1200   APPEARANCEUR CLOUDY (A) 10/15/2020 1200   APPEARANCEUR Hazy 07/27/2013 1742   LABSPEC 1.014 10/15/2020 1200   LABSPEC 1.026 07/27/2013 1742   PHURINE 6.0 10/15/2020 1200   GLUCOSEU NEGATIVE 10/15/2020 1200   GLUCOSEU Negative 07/27/2013 1742   HGBUR LARGE (A) 10/15/2020 1200   BILIRUBINUR NEGATIVE 10/15/2020 1200   BILIRUBINUR Negative 07/27/2013 1742   KETONESUR NEGATIVE 10/15/2020 1200   PROTEINUR 100 (A) 10/15/2020 1200   UROBILINOGEN 1 12/17/2013 1118   NITRITE POSITIVE (A) 10/15/2020 1200   LEUKOCYTESUR LARGE (A) 10/15/2020 1200   LEUKOCYTESUR Negative 07/27/2013 1742   Sepsis Labs: No results found for this or any previous visit (from the past 240 hour(s)).   Radiological Exams on Admission: CT Chest W Contrast  Result Date: 10/15/2020 CLINICAL DATA:  Nausea and vomiting. HIV. Persistent vomiting and diarrhea with abdominal pain for 1 month. Cough. Weakness. EXAM: CT CHEST, ABDOMEN, AND PELVIS WITH CONTRAST TECHNIQUE: Multidetector CT imaging of the chest, abdomen and pelvis was performed following the standard protocol during bolus administration of intravenous contrast. CONTRAST:  100mL OMNIPAQUE IOHEXOL 300 MG/ML  SOLN COMPARISON:  Chest radiograph 08/07/2020. Abdominopelvic CT 02/20/2020. FINDINGS: CT CHEST FINDINGS Cardiovascular: Normal aortic caliber. Normal heart size, without pericardial effusion. No central pulmonary embolism, on this non-dedicated study. Mediastinum/Nodes: No mediastinal or hilar adenopathy. Subtle esophageal fluid level on 37/3.  Mild esophageal wall thickening, especially superiorly on 16/3. Lungs/Pleura: No pleural fluid. Mild centrilobular and paraseptal emphysema. Scattered bilateral pulmonary nodules. Many of these, especially in the right upper lobe, are somewhat ill-defined and favored to be infectious/inflammatory. Examples in the right upper lobe on 77 and 84 of series 4. Clustered nodules are also identified within the posterior left upper lobe on 62/4 and 70/4. Other more well-circumscribed nodules are indeterminate. Example in the right lower lobe at 4 mm on 130/4 and the right upper lobe at 4 mm on 66/4. Musculoskeletal: No acute osseous abnormality. CT ABDOMEN PELVIS FINDINGS Hepatobiliary: Normal liver. Normal gallbladder, without biliary ductal dilatation. Pancreas: Normal, without mass or ductal dilatation. Spleen: Normal in size, without focal abnormality. Adrenals/Urinary Tract: Normal adrenal glands. Subtle  new uroepithelial mucosal thickening and mild hyperenhancement including within the renal pelvis on 84/3. No hydroureter or bladder abnormality. No hydronephrosis or renal mass. Stomach/Bowel: Normal stomach, without wall thickening. Mild wall thickening and mucosal hyperenhancement within the left side of the colon, rectum, sigmoid. The transverse and ascending colon are underdistended, but appear moderately thick walled with mucosal hyperenhancement. The terminal ileum is also underdistended but likely thickened including on 104/3. Normal small bowel. Vascular/Lymphatic: Normal aortic caliber. Prominent abdominal retroperitoneal nodes are similar to 02/13/2019 and likely reactive in the setting of HIV. Reproductive: Normal prostate. Other: No significant free fluid.  No free intraperitoneal air. Musculoskeletal: No acute osseous abnormality. IMPRESSION: 1. Diffuse colonic wall thickening with mild mucosal hyperenhancement. This could represent infection (exclude C difficile) or inflammation. Given suspicion of  concurrent terminal ileal wall thickening, Crohn disease or ulcerative colitis should be considered. 2. Scattered pulmonary opacities, primarily favored to represent mild atypical infection of indeterminate acuity. Other pulmonary nodules are technically indeterminate. No follow-up needed if patient is low-risk. Non-contrast chest CT can be considered in 12 months if patient is high-risk. This recommendation follows the consensus statement: Guidelines for Management of Incidental Pulmonary Nodules Detected on CT Images: From the Fleischner Society 2017; Radiology 2017; 284:228-243. 3. Esophageal wall thickening is mild, suggesting esophagitis. Esophageal air fluid level suggests dysmotility or gastroesophageal reflux. 4. Emphysema 5. Subtle urothelial mucosal hyperenhancement and wall thickening,primarily in the right renal pelvis. Correlate with urinalysis to exclude ascending infection. Electronically Signed   By: Jeronimo Greaves M.D.   On: 10/15/2020 14:36   CT Abdomen Pelvis W Contrast  Result Date: 10/15/2020 CLINICAL DATA:  Nausea and vomiting. HIV. Persistent vomiting and diarrhea with abdominal pain for 1 month. Cough. Weakness. EXAM: CT CHEST, ABDOMEN, AND PELVIS WITH CONTRAST TECHNIQUE: Multidetector CT imaging of the chest, abdomen and pelvis was performed following the standard protocol during bolus administration of intravenous contrast. CONTRAST:  OMNIPAQUE IOHEXOL 300 MG/ML  SOLN COMPARISON:  Chest radiograph 08/07/2020. Abdominopelvic CT 02/20/2020. FINDINGS: CT CHEST FINDINGS Cardiovascular: Normal aortic caliber. Normal heart size, without pericardial effusion. No central pulmonary embolism, on this non-dedicated study. Mediastinum/Nodes: No mediastinal or hilar adenopathy. Subtle esophageal fluid level on 37/3. Mild esophageal wall thickening, especially superiorly on 16/3. Lungs/Pleura: No pleural fluid. Mild centrilobular and paraseptal emphysema. Scattered bilateral pulmonary nodules.  Many of these, especially in the right upper lobe, are somewhat ill-defined and favored to be infectious/inflammatory. Examples in the right upper lobe on 77 and 84 of series 4. Clustered nodules are also identified within the posterior left upper lobe on 62/4 and 70/4. Other more well-circumscribed nodules are indeterminate. Example in the right lower lobe at 4 mm on 130/4 and the right upper lobe at 4 mm on 66/4. Musculoskeletal: No acute osseous abnormality. CT ABDOMEN PELVIS FINDINGS Hepatobiliary: Normal liver. Normal gallbladder, without biliary ductal dilatation. Pancreas: Normal, without mass or ductal dilatation. Spleen: Normal in size, without focal abnormality. Adrenals/Urinary Tract: Normal adrenal glands. Subtle new uroepithelial mucosal thickening and mild hyperenhancement including within the renal pelvis on 84/3. No hydroureter or bladder abnormality. No hydronephrosis or renal mass. Stomach/Bowel: Normal stomach, without wall thickening. Mild wall thickening and mucosal hyperenhancement within the left side of the colon, rectum, sigmoid. The transverse and ascending colon are underdistended, but appear moderately thick walled with mucosal hyperenhancement. The terminal ileum is also underdistended but likely thickened including on 104/3. Normal small bowel. Vascular/Lymphatic: Normal aortic caliber. Prominent abdominal retroperitoneal nodes are similar to 02/13/2019 and likely reactive  in the setting of HIV. Reproductive: Normal prostate. Other: No significant free fluid.  No free intraperitoneal air. Musculoskeletal: No acute osseous abnormality. IMPRESSION: 1. Diffuse colonic wall thickening with mild mucosal hyperenhancement. This could represent infection (exclude C difficile) or inflammation. Given suspicion of concurrent terminal ileal wall thickening, Crohn disease or ulcerative colitis should be considered. 2. Scattered pulmonary opacities, primarily favored to represent mild atypical  infection of indeterminate acuity. Other pulmonary nodules are technically indeterminate. No follow-up needed if patient is low-risk. Non-contrast chest CT can be considered in 12 months if patient is high-risk. This recommendation follows the consensus statement: Guidelines for Management of Incidental Pulmonary Nodules Detected on CT Images: From the Fleischner Society 2017; Radiology 2017; 284:228-243. 3. Esophageal wall thickening is mild, suggesting esophagitis. Esophageal air fluid level suggests dysmotility or gastroesophageal reflux. 4. Emphysema 5. Subtle urothelial mucosal hyperenhancement and wall thickening,primarily in the right renal pelvis. Correlate with urinalysis to exclude ascending infection. Electronically Signed   By: Jeronimo Greaves M.D.   On: 10/15/2020 14:36    EKG: Independently reviewed.  Sinus rhythm at 73 bpm  Assessment/Plan Nausea, vomiting, and diarrhea  colitis: Patient presents with nausea, vomiting, and diarrhea for the last month.  He has lost about 15-18 pounds in weight due to symptoms.  Pelvis significant for colitis.  C. difficile studies were negative. -Admit to a medical telemetry bed -Strict intake and output -Follow-up GI panel -Continue Rocephin IV and metronidazole -GI consulted and may need to do colonoscopy, if patient able to tolerate bowel prep  Dysphagia: Acute.  Patient reports feeling as though food and and liquids get stuck in chest subsequently causes him to vomit. -Clear liquid diet and n.p.o. after midnight for EGD planned for in a.m. -Appreciate [facility] consultative services, will follow-up for further recommendations  Pyelonephritis: Acute. Urinalysis was positive for signs of infection.  CT imaging significant for right renal enhancement concerning for a sending infection. -Follow-up urine culture -Continue empiric antibiotics of Rocephin IV   Abnormal CT of the chest: Patient noted to have scattered pulmonary opacities concerning for  atypical infection along with other pulmonary nodules.  He had recently had COVID-19 in January of this year. -Work-up per ID  Hypokalemia: Acute.  Potassium was 2.8 and patient was ordered potassium chloride 40 mEq IV.  Suspect secondary to nausea, vomiting, and diarrhea symptoms. -Continue home potassium chloride 20 mEq daily -Continue to monitor and replace as needed  AIDS: Uncontrolled.  Patient reported taking medications as prescribed although last CD4 count was less than 35. -Continue Symtuza  Macrocytic anemia: Acute.  Hemoglobin 12.2 g/dL on admission with MCV 240.  Patient reports that he has intermittently been vomiting up blood and bright red blood per rectum. -Continue to monitor H&H  Severe protein calorie malnutrition: Acute on chronic.  Albumin 2.6. -Check prealbumin in a.m.   GERD with esophagitis -Continue pharmacy substitution of Protonix for omeprazole  Polysubstance use: Patient admits to using marijuana and tobacco. -Counseled on need of cessation of tobacco and marijuana use  DVT prophylaxis: SCDs Code Status: Full Family Communication: None Disposition Plan: Hopefully discharge home once medically stable Consults called: ID and  GI Admission status: Inpatient, require more than 2 midnight stay  Clydie Braun MD [facility]   If 7PM-7AM, please contact night-coverage   10/15/2020, 3:37 PM

## 2020-10-16 ENCOUNTER — Encounter (HOSPITAL_COMMUNITY)
Admission: EM | Disposition: A | Discharge status: Home / Self Care | Payer: Self-pay | Source: Home / Self Care | Attending: Internal Medicine

## 2020-10-16 ENCOUNTER — Inpatient Hospital Stay (HOSPITAL_COMMUNITY): Payer: Medicaid Other | Admitting: Anesthesiology

## 2020-10-16 ENCOUNTER — Encounter (HOSPITAL_COMMUNITY): Payer: Self-pay | Admitting: Internal Medicine

## 2020-10-16 ENCOUNTER — Other Ambulatory Visit (HOSPITAL_COMMUNITY): Payer: Self-pay

## 2020-10-16 DIAGNOSIS — R1310 Dysphagia, unspecified: Secondary | ICD-10-CM

## 2020-10-16 DIAGNOSIS — B3781 Candidal esophagitis: Secondary | ICD-10-CM

## 2020-10-16 DIAGNOSIS — K2970 Gastritis, unspecified, without bleeding: Secondary | ICD-10-CM

## 2020-10-16 DIAGNOSIS — A498 Other bacterial infections of unspecified site: Secondary | ICD-10-CM

## 2020-10-16 DIAGNOSIS — E43 Unspecified severe protein-calorie malnutrition: Secondary | ICD-10-CM

## 2020-10-16 DIAGNOSIS — E876 Hypokalemia: Secondary | ICD-10-CM | POA: Diagnosis not present

## 2020-10-16 DIAGNOSIS — R319 Hematuria, unspecified: Secondary | ICD-10-CM

## 2020-10-16 DIAGNOSIS — R933 Abnormal findings on diagnostic imaging of other parts of digestive tract: Secondary | ICD-10-CM

## 2020-10-16 DIAGNOSIS — R112 Nausea with vomiting, unspecified: Secondary | ICD-10-CM | POA: Diagnosis not present

## 2020-10-16 DIAGNOSIS — N390 Urinary tract infection, site not specified: Secondary | ICD-10-CM

## 2020-10-16 DIAGNOSIS — A039 Shigellosis, unspecified: Secondary | ICD-10-CM

## 2020-10-16 DIAGNOSIS — K529 Noninfective gastroenteritis and colitis, unspecified: Secondary | ICD-10-CM

## 2020-10-16 DIAGNOSIS — B20 Human immunodeficiency virus [HIV] disease: Secondary | ICD-10-CM | POA: Diagnosis not present

## 2020-10-16 DIAGNOSIS — R197 Diarrhea, unspecified: Secondary | ICD-10-CM

## 2020-10-16 HISTORY — PX: BIOPSY: SHX5522

## 2020-10-16 HISTORY — PX: ESOPHAGOGASTRODUODENOSCOPY (EGD) WITH PROPOFOL: SHX5813

## 2020-10-16 LAB — GASTROINTESTINAL PANEL BY PCR, STOOL (REPLACES STOOL CULTURE)

## 2020-10-16 LAB — BASIC METABOLIC PANEL
Anion gap: 9 (ref 5–15)
Anion gap: 7 (ref 5–15)
BUN: 8 mg/dL (ref 6–20)
BUN: 8 mg/dL (ref 6–20)
CO2: 23 mmol/L (ref 22–32)
CO2: 22 mmol/L (ref 22–32)
Calcium: 7.9 mg/dL — ABNORMAL LOW (ref 8.9–10.3)
Calcium: 7.7 mg/dL — ABNORMAL LOW (ref 8.9–10.3)
Chloride: 103 mmol/L (ref 98–111)
Chloride: 106 mmol/L (ref 98–111)
Creatinine, Ser: 1.03 mg/dL (ref 0.61–1.24)
Creatinine, Ser: 1.03 mg/dL (ref 0.61–1.24)
GFR, Estimated: >60 mL/min (ref >60)
GFR, Estimated: >60 mL/min (ref >60)
Glucose, Bld: 86 mg/dL (ref 70–99)
Glucose, Bld: 151 mg/dL — ABNORMAL HIGH (ref 70–99)
Potassium: 2.4 mmol/L — CL (ref 3.5–5.1)
Potassium: 2.9 mmol/L — ABNORMAL LOW (ref 3.5–5.1)
Sodium: 135 mmol/L (ref 135–145)
Sodium: 135 mmol/L (ref 135–145)

## 2020-10-16 LAB — CBC
HCT: 29.5 % — ABNORMAL LOW (ref 39.0–52.0)
Hemoglobin: 9.8 g/dL — ABNORMAL LOW (ref 13.0–17.0)
MCH: 30.9 pg (ref 26.0–34.0)
MCHC: 33.2 g/dL (ref 30.0–36.0)
MCV: 93.1 fL (ref 80.0–100.0)
Platelets: 164 10*3/uL (ref 150–400)
RBC: 3.17 MIL/uL — ABNORMAL LOW (ref 4.22–5.81)
RDW: 16.4 % — ABNORMAL HIGH (ref 11.5–15.5)
WBC: 3.9 10*3/uL — ABNORMAL LOW (ref 4.0–10.5)

## 2020-10-16 LAB — BLOOD GAS, VENOUS
Acid-base deficit: 1.5 mmol/L (ref 0.0–2.0)
Bicarbonate: 22.7 mmol/L (ref 20.0–28.0)
Drawn by: 2585
FIO2: 21
O2 Saturation: 92.6 %
Patient temperature: 37
pCO2, Ven: 37.5 mmHg — ABNORMAL LOW (ref 44.0–60.0)
pH, Ven: 7.398 (ref 7.250–7.430)
pO2, Ven: 72.1 mmHg — ABNORMAL HIGH (ref 32.0–45.0)

## 2020-10-16 LAB — PREALBUMIN: Prealbumin: 8.4 mg/dL — ABNORMAL LOW (ref 18–38)

## 2020-10-16 LAB — CRYPTOCOCCAL ANTIGEN: Crypto Ag: NEGATIVE

## 2020-10-16 LAB — PHOSPHORUS: Phosphorus: 4 mg/dL (ref 2.5–4.6)

## 2020-10-16 LAB — CD4/CD8 (T-HELPER/T-SUPPRESSOR CELL)
CD4 absolute: <35 /uL — ABNORMAL LOW (ref 400–1790)
CD4%: 3 % — ABNORMAL LOW (ref 33–65)
CD8 T Cell Abs: 68 /uL — ABNORMAL LOW (ref 190–1000)
CD8tox: 68 % — ABNORMAL HIGH (ref 12–40)
Ratio: 0.05 — ABNORMAL LOW (ref 1.0–3.0)
Total lymphocyte count: 100 /uL — ABNORMAL LOW (ref 1000–4000)

## 2020-10-16 LAB — MAGNESIUM: Magnesium: 2.1 mg/dL (ref 1.7–2.4)

## 2020-10-16 LAB — POTASSIUM: Potassium: 3.1 mmol/L — ABNORMAL LOW (ref 3.5–5.1)

## 2020-10-16 SURGERY — ESOPHAGOGASTRODUODENOSCOPY (EGD) WITH PROPOFOL
Anesthesia: Monitor Anesthesia Care

## 2020-10-16 MED ORDER — POTASSIUM CHLORIDE 10 MEQ/100ML IV SOLN: 10.0000 meq | INTRAVENOUS | Status: AC

## 2020-10-16 MED ORDER — SODIUM CHLORIDE 0.9 % IV SOLN: INTRAVENOUS | Status: DC

## 2020-10-16 MED ORDER — LACTATED RINGERS IV BOLUS
500.0000 mL | Freq: Once | INTRAVENOUS | Status: AC
  Administered 2020-10-16: 500 mL via INTRAVENOUS

## 2020-10-16 MED ORDER — FLUCONAZOLE IN SODIUM CHLORIDE 400-0.9 MG/200ML-% IV SOLN
400.0000 mg | Freq: Once | INTRAVENOUS | Status: AC
  Administered 2020-10-16: 400 mg via INTRAVENOUS
  Filled 2020-10-16: qty 200

## 2020-10-16 MED ORDER — FLUCONAZOLE 100 MG PO TABS
400.0000 mg | ORAL_TABLET | Freq: Every day | ORAL | Status: DC
  Administered 2020-10-17 – 2020-10-23 (×7): 400 mg via ORAL
  Filled 2020-10-16 (×7): qty 4

## 2020-10-16 MED ORDER — SULFAMETHOXAZOLE-TRIMETHOPRIM 400-80 MG PO TABS
1.0000 | ORAL_TABLET | Freq: Every day | ORAL | Status: DC
  Administered 2020-10-16 – 2020-10-23 (×8): 1 via ORAL
  Filled 2020-10-16 (×8): qty 1

## 2020-10-16 MED ORDER — POTASSIUM CHLORIDE 10 MEQ/100ML IV SOLN
10.0000 meq | INTRAVENOUS | Status: AC
  Administered 2020-10-16 (×2): 10 meq via INTRAVENOUS
  Filled 2020-10-16: qty 100

## 2020-10-16 MED ORDER — PROPOFOL 10 MG/ML IV BOLUS
INTRAVENOUS | Status: DC | PRN
  Administered 2020-10-16 (×2): 20 mg via INTRAVENOUS

## 2020-10-16 MED ORDER — POTASSIUM CHLORIDE CRYS ER 20 MEQ PO TBCR
40.0000 meq | EXTENDED_RELEASE_TABLET | Freq: Four times a day (QID) | ORAL | Status: AC
  Administered 2020-10-16: 40 meq via ORAL
  Filled 2020-10-16: qty 2

## 2020-10-16 MED ORDER — POTASSIUM CHLORIDE 10 MEQ/100ML IV SOLN
INTRAVENOUS | Status: AC
  Filled 2020-10-16: qty 100

## 2020-10-16 MED ORDER — POTASSIUM CHLORIDE 10 MEQ/100ML IV SOLN
10.0000 meq | INTRAVENOUS | Status: DC
  Administered 2020-10-16 (×2): 10 meq via INTRAVENOUS
  Filled 2020-10-16 (×3): qty 100

## 2020-10-16 MED ORDER — ONDANSETRON HCL 4 MG/2ML IJ SOLN
INTRAMUSCULAR | Status: DC | PRN
  Administered 2020-10-16: 4 mg via INTRAVENOUS

## 2020-10-16 MED ORDER — PANTOPRAZOLE SODIUM 40 MG IV SOLR
40.0000 mg | Freq: Two times a day (BID) | INTRAVENOUS | Status: DC
  Administered 2020-10-16 – 2020-10-19 (×6): 40 mg via INTRAVENOUS
  Filled 2020-10-16 (×7): qty 40

## 2020-10-16 MED ORDER — PROPOFOL 500 MG/50ML IV EMUL
INTRAVENOUS | Status: DC | PRN
  Administered 2020-10-16: 125 ug/kg/min via INTRAVENOUS

## 2020-10-16 SURGICAL SUPPLY — 15 items

## 2020-10-16 NOTE — Consult Note (Addendum)
[facility] Consult: 8:23 AM 10/16/2020  LOS: 1 day    Referring Provider: Dr Randol Kern Primary Care Physician:  Patient, No Pcp Per Primary Gastroenterologist:  Dr Rhea Belton    Reason for Consultation: Nausea, vomiting, diarrhea, colitis.   HPI: Jacob Gutierrez is a 35 y.o. male.  PMH AIDS, dz not well controlled w hx poor compliance with meds.  Shigella gastroenteritis.  Coccidiomycosis.  Syphilis.  Anal dysplasia.  On chronic Bactrim.  Vaccinated for COVID-19 but tested positive for Covid 08/07/2020..  02/20/2020 CTAP showed slight rectal wall thickening. 09/02/2020 ER visit for nausea, vomiting, bloody diarrhea, hiccups for several days.  Hypokalemia was addressed, given fluids, GI cocktail, Reglan and released. 09/18/2018 2 GI office visit with PA Lemmon regarding the GI symptoms by that time he had no further nausea vomiting or hiccups but was still having up to 10 watery, sometimes bloody stools daily.  Also C/O globus sensation and sense that food was sticking in his esophagus.  Omeprazole and Gaviscon prn not offering much relief. Patient was taken that day after the office visit were to check GI pathogen studies, O&P, fecal lactoferrin, fecal pancreatic elastase.  Initiate Imodium every 8 hours, initiate scheduled omeprazole 20 mg bid.  Note mentions probable EGD and colonoscopy but these have yet to be scheduled. GI pathogen panel detected Shigella/enteroinvasive E. coli, norovirus.  Pancreatic elastase normal at 72.  Fecal lactoferrin positive.  No ova/parasites detected.  Presented to the ED yesterday morning upon advisement from the ID clinic..  C/O progressive weakness, diarrhea, vomiting, decreased p.o. intake.  Occasional scant blood in his vomit and blood on tissue paper with wiping.  Occasional  cramping abdominal pain which do not linger..  No fevers.  Debilitating weakness.  Never initiated scheduled omeprazole as the label on one of his other medications said not to take omeprazole in combination with his unspecified meds.  Patient has not got a lot of insight into his meds, he just feels the prescriptions and follows the labels on the bottles but is not aware of indications for specific meds.  Currently says he has about 5 watery stools daily, sometimes at night.  Imodium helps the diarrhea a little bit.  2-3 episodes of vomiting per day.  Weight loss of about 20 pounds per patient.  BUN/creatinine normal.  Potassium 2.4.  LFTs normal the exception of albumin low 2.6.. Hgb 12.2, MCV 101.  WBCs normal. C. difficile negative. Cryptococcal antigen negative Venous lactic acid normal CMV IgG, CMV IgM pending. Blastomyces antigen, histoplasma antigen ordered, not collected. Low prealbumin. U/a with greater than 50 WBCs, greater than 50 RBCs, many bacteria, 6-10 squamous cells.  Large leukocytes, positive nitrites. CT chest/abdomen/pelvis w contrast: Diffuse colonic wall thickening and mucosal enhancement, suspicion of concurrent TI wall thickening.  ?inflammation/IBD versus infection?  Scattered lung opacities.  Mild esophageal wall thickening s/o esophagitis.  Esophageal AF levels suggest dysmotility or reflux.  Emphysema.  Urothelial mucosal heart hyperenhancement and wall thickening   Past Medical History:  Diagnosis Date  . AIDS (acquired immune deficiency syndrome) (HCC)   .  AIDS (acquired immune deficiency syndrome) (HCC)   . 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 H/O coccidioidomycosis    pulmonary   . HIV disease (HCC)   . Past history of allergy to penicillin-type antibiotic 07-03-2012   desensitization  . PNA (pneumonia)   . Shigella gastroenteritis   . Syphilis    history /treated     Past Surgical History:  Procedure Laterality  Date  . TRANSURETHRAL RESECTION OF PROSTATE N/A 12/09/2017   Procedure: TRANSURETHRAL RESECTION OF THE PROSTATE (TURP);  Surgeon: Crist Fat, MD;  Location: [facility];  Service: Urology;  Laterality: N/A;    Prior to Admission medications   Medication Sig Start Date End Date Taking? Authorizing Provider  Darunavir-Cobicisctat-Emtricitabine-Tenofovir Alafenamide (SYMTUZA) 800-150-200-10 MG TABS Take 1 tablet by mouth daily with breakfast. 07/08/20  Yes Kuppelweiser, Cassie L, RPH-CPP  lipase/protease/amylase (CREON) 36000 UNITS CPEP capsule Take 2 capsules (72,000 units) by mouth WITH every meal, take 1 capsule (36,000 units) by mouth with every snack. Patient taking differently: Take 36,000-72,000 Units by mouth See admin instructions. Take 2 capsules (72,000 units) by mouth WITH every meal and 1 capsule (36,000 units) by mouth with every snack 09/25/20  Yes Unk Lightning, PA  loperamide (IMODIUM A-D) 2 MG tablet Take 1 tablet (2 mg total) by mouth every 8 (eight) hours. Patient taking differently: Take 2 mg by mouth every 8 (eight) hours as needed for diarrhea or loose stools. 09/18/20  Yes Unk Lightning, PA  ondansetron (ZOFRAN-ODT) 8 MG disintegrating tablet Take 1 tablet (8 mg total) by mouth every 8 (eight) hours as needed for nausea or vomiting. Patient taking differently: Take 8 mg by mouth every 8 (eight) hours as needed for nausea or vomiting (DISSOLVE ORALLY). 10/08/20  Yes Georgian Co M, PA-C  sulfamethoxazole-trimethoprim (BACTRIM DS) 800-160 MG tablet Take 1 tablet by mouth daily. Patient taking differently: Take 1 tablet by mouth in the morning. 07/08/20  Yes Kuppelweiser, Cassie L, RPH-CPP  ferrous sulfate 324 (65 Fe) MG TBEC Take 1 tablet (325 mg total) by mouth daily after breakfast. 10/14/20   Anders Simmonds, PA-C  omeprazole (PRILOSEC) 20 MG capsule Take 1 capsule (20 mg total) by mouth 2 (two) times daily before a meal. Take one capsule 30-60 minutes  before breakfast and dinner 09/18/20   Unk Lightning, PA  potassium chloride (KLOR-CON) 10 MEQ tablet Take 2 tablets (20 mEq total) by mouth daily. 10/14/20   Anders Simmonds, PA-C    Scheduled Meds: . Darunavir-Cobicisctat-Emtricitabine-Tenofovir Alafenamide  1 tablet Oral Q breakfast  . lipase/protease/amylase  72,000 Units Oral TID WC  . metroNIDAZOLE  500 mg Oral Q8H  . pantoprazole  40 mg Oral BID  . potassium chloride  20 mEq Oral Daily  . potassium chloride  40 mEq Oral Q6H   Infusions: . cefTRIAXone (ROCEPHIN)  IV 2 g (10/15/20 1849)  . lactated ringers 125 mL/hr at 10/15/20 1932  . potassium chloride     PRN Meds: acetaminophen, albuterol, lipase/protease/amylase, ondansetron (ZOFRAN) IV   Allergies as of 10/15/2020 - Review Complete 10/15/2020  Allergen Reaction Noted  . Vancomycin Anaphylaxis, Itching, Swelling, and Other (See Comments) 04/27/2018  . Amoxicillin Other (See Comments) 12/08/2013    Family History  Problem Relation Age of Onset  . Hypertension Mother   . Lupus Maternal Grandmother   . Diabetes Maternal Grandmother   . Cancer Paternal Grandfather        unknown   .  Prostate cancer Father   . Diabetes Father   . Diabetes Maternal Grandfather   . Colon cancer Neg Hx   . Stomach cancer Neg Hx   . Esophageal cancer Neg Hx   . Pancreatic cancer Neg Hx     Social History   Socioeconomic History  . Marital status: Single    Spouse name: Not on file  . Number of children: Not on file  . Years of education: Not on file  . Highest education level: Not on file  Occupational History  . Not on file  Tobacco Use  . Smoking status: Current Every Day Smoker    Packs/day: 0.30    Types: Cigarettes  . Smokeless tobacco: Never Used  . Tobacco comment: cutting back  Vaping Use  . Vaping Use: Never used  Substance and Sexual Activity  . Alcohol use: Not Currently    Alcohol/week: 1.0 standard drink    Types: 1 Standard drinks or equivalent  per week    Comment: whiskey occasionally   . Drug use: Not Currently    Frequency: 1.0 times per week    Types: Marijuana  . Sexual activity: Yes    Partners: Female, Male    Birth control/protection: Condom    Comment: "I have plenty of condoms, need lube"  Other Topics Concern  . Not on file  Social History Narrative  . Not on file   Social Determinants of Health   Financial Resource Strain: Not on file  Food Insecurity: Not on file  Transportation Needs: Not on file  Physical Activity: Not on file  Stress: Not on file  Social Connections: Not on file  Intimate Partner Violence: Not on file    REVIEW OF SYSTEMS: Constitutional: Weakness, fatigue ENT:  No nose bleeds Pulm: No shortness of breath.  Positive nonproductive cough. CV:  No palpitations, no LE edema.  GU:  No hematuria, no frequency GI: Dysphagia seems better than it was a few weeks ago. Heme: No unusual bleeding or bruising. Transfusions: None Neuro:  No headaches, no peripheral tingling or numbness.  No syncope, no seizures. Derm:  No itching, no rash or sores.  Endocrine:  No sweats or chills.  No polyuria or dysuria Immunization: Reviewed his medications.  He was vaccinated for hepatitis A in 2015.  Do not see that he has had hepatitis B vaccination. Travel:  None beyond local counties in last few months.    PHYSICAL EXAM: Vital signs in last 24 hours: Vitals:   10/16/20 0511 10/16/20 0720  BP: (!) 95/58 (!) 89/52  Pulse: 95 85  Resp: 18 19  Temp: (!) 100.6 F (38.1 C) 99.1 F (37.3 C)  SpO2: 92% 96%   Wt Readings from Last 3 Encounters:  10/15/20 66.9 kg  10/15/20 64.9 kg  10/08/20 68.2 kg    General: Cachectic unwell, comfortable. Head: Fat/muscle wasting in the face.  No asymmetry.  No signs of head trauma. Eyes: No conjunctival pallor.  No scleral icterus. Ears: Hearing intact Nose: No congestion or discharge Mouth: Mucosa is pink, moist, clear.  Extremely poor dentition, many teeth  are missing and the remaining teeth are riddled with caries, plaque/tartar. Neck: No JVD, no masses, no thyromegaly Lungs: Diminished breath sounds, no adventitious sounds but generally poor inspiratory effort.  No cough Heart: RRR.  No MRG.  S1, S2 present Abdomen: Thin, no masses.  No HSM, bruits, hernias.  Active bowel sounds.  Not tender..   Rectal: Deferred Musc/Skeltl: No joint redness, swelling  or gross deformity. Extremities: No CCE. Neurologic: Alert.  Oriented x3.  Moves all 4 limbs, strength not tested.  No tremors. Skin: No open sores or rash.   Psych: Affect flat, laconic.  Calm.  Intake/Output from previous day: 03/24 0701 - 03/25 0700 In: 2337.2 [I.V.:908.3; IV Piggyback:1428.9] Out: 325 [Urine:325] Intake/Output this shift: No intake/output data recorded.  LAB RESULTS: Recent Labs    10/15/20 1036 10/15/20 1252  WBC 4.1  --   HGB 12.2* 11.9*  HCT 39.0 35.0*  PLT 218  --    BMET Lab Results  Component Value Date   NA 135 10/16/2020   NA 138 10/15/2020   NA 137 10/15/2020   K 2.4 (LL) 10/16/2020   K 2.9 (L) 10/15/2020   K 2.8 (L) 10/15/2020   CL 103 10/16/2020   CL 102 10/15/2020   CL 100 10/08/2020   CO2 23 10/16/2020   CO2 25 10/15/2020   CO2 22 10/08/2020   GLUCOSE 86 10/16/2020   GLUCOSE 91 10/15/2020   GLUCOSE 94 10/08/2020   BUN 8 10/16/2020   BUN 12 10/15/2020   BUN 10 10/08/2020   CREATININE 1.03 10/16/2020   CREATININE 1.01 10/15/2020   CREATININE 0.95 10/08/2020   CALCIUM 7.9 (L) 10/16/2020   CALCIUM 8.5 (L) 10/15/2020   CALCIUM 8.4 (L) 10/08/2020   LFT Recent Labs    10/15/20 1036  PROT 8.5*  ALBUMIN 2.6*  AST 32  ALT 16  ALKPHOS 60  BILITOT 0.7   PT/INR Lab Results  Component Value Date   INR 1.1 10/15/2020   INR 1.1 08/07/2020   Hepatitis Panel No results for input(s): HEPBSAG, HCVAB, HEPAIGM, HEPBIGM in the last 72 hours. C-Diff No components found for: CDIFF Lipase     Component Value Date/Time   LIPASE 52  (H) 09/02/2020 1631   LIPASE 162 09/27/2011 0040    Drugs of Abuse  No results found for: LABOPIA, COCAINSCRNUR, LABBENZ, AMPHETMU, THCU, LABBARB   RADIOLOGY STUDIES: CT Chest W Contrast  Result Date: 10/15/2020 CLINICAL DATA:  Nausea and vomiting. HIV. Persistent vomiting and diarrhea with abdominal pain for 1 month. Cough. Weakness. EXAM: CT CHEST, ABDOMEN, AND PELVIS WITH CONTRAST TECHNIQUE: Multidetector CT imaging of the chest, abdomen and pelvis was performed following the standard protocol during bolus administration of intravenous contrast. CONTRAST:  OMNIPAQUE IOHEXOL 300 MG/ML  SOLN COMPARISON:  Chest radiograph 08/07/2020. Abdominopelvic CT 02/20/2020. FINDINGS: CT CHEST FINDINGS Cardiovascular: Normal aortic caliber. Normal heart size, without pericardial effusion. No central pulmonary embolism, on this non-dedicated study. Mediastinum/Nodes: No mediastinal or hilar adenopathy. Subtle esophageal fluid level on 37/3. Mild esophageal wall thickening, especially superiorly on 16/3. Lungs/Pleura: No pleural fluid. Mild centrilobular and paraseptal emphysema. Scattered bilateral pulmonary nodules. Many of these, especially in the right upper lobe, are somewhat ill-defined and favored to be infectious/inflammatory. Examples in the right upper lobe on 77 and 84 of series 4. Clustered nodules are also identified within the posterior left upper lobe on 62/4 and 70/4. Other more well-circumscribed nodules are indeterminate. Example in the right lower lobe at 4 mm on 130/4 and the right upper lobe at 4 mm on 66/4. Musculoskeletal: No acute osseous abnormality. CT ABDOMEN PELVIS FINDINGS Hepatobiliary: Normal liver. Normal gallbladder, without biliary ductal dilatation. Pancreas: Normal, without mass or ductal dilatation. Spleen: Normal in size, without focal abnormality. Adrenals/Urinary Tract: Normal adrenal glands. Subtle new uroepithelial mucosal thickening and mild hyperenhancement including  within the renal pelvis on 84/3. No hydroureter or bladder abnormality.  No hydronephrosis or renal mass. Stomach/Bowel: Normal stomach, without wall thickening. Mild wall thickening and mucosal hyperenhancement within the left side of the colon, rectum, sigmoid. The transverse and ascending colon are underdistended, but appear moderately thick walled with mucosal hyperenhancement. The terminal ileum is also underdistended but likely thickened including on 104/3. Normal small bowel. Vascular/Lymphatic: Normal aortic caliber. Prominent abdominal retroperitoneal nodes are similar to 02/13/2019 and likely reactive in the setting of HIV. Reproductive: Normal prostate. Other: No significant free fluid.  No free intraperitoneal air. Musculoskeletal: No acute osseous abnormality. IMPRESSION: 1. Diffuse colonic wall thickening with mild mucosal hyperenhancement. This could represent infection (exclude C difficile) or inflammation. Given suspicion of concurrent terminal ileal wall thickening, Crohn disease or ulcerative colitis should be considered. 2. Scattered pulmonary opacities, primarily favored to represent mild atypical infection of indeterminate acuity. Other pulmonary nodules are technically indeterminate. No follow-up needed if patient is low-risk. Non-contrast chest CT can be considered in 12 months if patient is high-risk. This recommendation follows the consensus statement: Guidelines for Management of Incidental Pulmonary Nodules Detected on CT Images: From the Fleischner Society 2017; Radiology 2017; 284:228-243. 3. Esophageal wall thickening is mild, suggesting esophagitis. Esophageal air fluid level suggests dysmotility or gastroesophageal reflux. 4. Emphysema 5. Subtle urothelial mucosal hyperenhancement and wall thickening,primarily in the right renal pelvis. Correlate with urinalysis to exclude ascending infection. Electronically Signed   By: Jeronimo Greaves M.D.   On: 10/15/2020 14:36   CT Abdomen Pelvis W  Contrast  Result Date: 10/15/2020 CLINICAL DATA:  Nausea and vomiting. HIV. Persistent vomiting and diarrhea with abdominal pain for 1 month. Cough. Weakness. EXAM: CT CHEST, ABDOMEN, AND PELVIS WITH CONTRAST TECHNIQUE: Multidetector CT imaging of the chest, abdomen and pelvis was performed following the standard protocol during bolus administration of intravenous contrast. CONTRAST:  OMNIPAQUE IOHEXOL 300 MG/ML  SOLN COMPARISON:  Chest radiograph 08/07/2020. Abdominopelvic CT 02/20/2020. FINDINGS: CT CHEST FINDINGS Cardiovascular: Normal aortic caliber. Normal heart size, without pericardial effusion. No central pulmonary embolism, on this non-dedicated study. Mediastinum/Nodes: No mediastinal or hilar adenopathy. Subtle esophageal fluid level on 37/3. Mild esophageal wall thickening, especially superiorly on 16/3. Lungs/Pleura: No pleural fluid. Mild centrilobular and paraseptal emphysema. Scattered bilateral pulmonary nodules. Many of these, especially in the right upper lobe, are somewhat ill-defined and favored to be infectious/inflammatory. Examples in the right upper lobe on 77 and 84 of series 4. Clustered nodules are also identified within the posterior left upper lobe on 62/4 and 70/4. Other more well-circumscribed nodules are indeterminate. Example in the right lower lobe at 4 mm on 130/4 and the right upper lobe at 4 mm on 66/4. Musculoskeletal: No acute osseous abnormality. CT ABDOMEN PELVIS FINDINGS Hepatobiliary: Normal liver. Normal gallbladder, without biliary ductal dilatation. Pancreas: Normal, without mass or ductal dilatation. Spleen: Normal in size, without focal abnormality. Adrenals/Urinary Tract: Normal adrenal glands. Subtle new uroepithelial mucosal thickening and mild hyperenhancement including within the renal pelvis on 84/3. No hydroureter or bladder abnormality. No hydronephrosis or renal mass. Stomach/Bowel: Normal stomach, without wall thickening. Mild wall thickening and  mucosal hyperenhancement within the left side of the colon, rectum, sigmoid. The transverse and ascending colon are underdistended, but appear moderately thick walled with mucosal hyperenhancement. The terminal ileum is also underdistended but likely thickened including on 104/3. Normal small bowel. Vascular/Lymphatic: Normal aortic caliber. Prominent abdominal retroperitoneal nodes are similar to 02/13/2019 and likely reactive in the setting of HIV. Reproductive: Normal prostate. Other: No significant free fluid.  No free intraperitoneal air. Musculoskeletal:  No acute osseous abnormality. IMPRESSION: 1. Diffuse colonic wall thickening with mild mucosal hyperenhancement. This could represent infection (exclude C difficile) or inflammation. Given suspicion of concurrent terminal ileal wall thickening, Crohn disease or ulcerative colitis should be considered. 2. Scattered pulmonary opacities, primarily favored to represent mild atypical infection of indeterminate acuity. Other pulmonary nodules are technically indeterminate. No follow-up needed if patient is low-risk. Non-contrast chest CT can be considered in 12 months if patient is high-risk. This recommendation follows the consensus statement: Guidelines for Management of Incidental Pulmonary Nodules Detected on CT Images: From the Fleischner Society 2017; Radiology 2017; 284:228-243. 3. Esophageal wall thickening is mild, suggesting esophagitis. Esophageal air fluid level suggests dysmotility or gastroesophageal reflux. 4. Emphysema 5. Subtle urothelial mucosal hyperenhancement and wall thickening,primarily in the right renal pelvis. Correlate with urinalysis to exclude ascending infection. Electronically Signed   By: Jeronimo Greaves M.D.   On: 10/15/2020 14:36     IMPRESSION:   *   Nausea, vomiting, diarrhea. CTs showing progression from proctitis to colitis over the last 8 months. Stool studies last month positive for Shigella/enteroinvasive E. coli,  norovirus.  Was fecal lactoferrin positive.  Currently C. difficile negative. Appearance of GERD suggested by CT scan.  Not compliant with PPI as prescribed a month ago. Empiric Rocephin and Flagyl in place.    *    UTI.  Do not see urine microbiology. On Rocephin and Flagyl.    *    Cough.  CT showing scattered lung opacities, emphysema. Chronic bactrim for PGP prophylaxis.    *   HIV.  Latest HIV viral count of 449,000, CD 4 count <35 on 08/25/2020.  History of poor compliance with a Atripla, developed K103N and M184V resistance. Transitioned to Stribild and Prezista per ID notes.    *   Hypokalemia.  Received oral potassium 20 mEq yesterday and this morning.  So far has received 30 mEq of IV potassium  *    protein malnutrition of unclear degree.  Probably needs RD evaluation. BMI 19.4.        PLAN:     *    EGD planned for today. Patient needs colonoscopy but doubt he could complete bowel prep at this point. Stat potassium level at 1030 this morning.   Jennye Moccasin  10/16/2020, 8:23 AM Phone [phone]

## 2020-10-16 NOTE — Progress Notes (Signed)
   10/16/20 0511  Assess: MEWS Score  Temp (!) 100.6 F (38.1 C)  BP (!) 95/58  Pulse Rate 95  ECG Heart Rate 95  Resp 18  Level of Consciousness Alert  SpO2 92 %  O2 Device Room Air  Assess: MEWS Score  MEWS Temp 1  MEWS Systolic 1  MEWS Pulse 0  MEWS RR 0  MEWS LOC 0  MEWS Score 2  MEWS Score Color Yellow  Assess: if the MEWS score is Yellow or Red  Were vital signs taken at a resting state? Yes  Focused Assessment No change from prior assessment  Early Detection of Sepsis Score *See Row Information* Low  MEWS guidelines implemented *See Row Information* Yes  Treat  MEWS Interventions Escalated (See documentation below)  Pain Scale 0-10  Pain Score 4  Pain Location Head  Take Vital Signs  Increase Vital Sign Frequency  Yellow: Q 2hr X 2 then Q 4hr X 2, if remains yellow, continue Q 4hrs  Escalate  MEWS: Escalate Yellow: discuss with charge nurse/RN and consider discussing with provider and RRT  Notify: Charge Nurse/RN  Name of Charge Nurse/RN Notified Joni Reining  Date Charge Nurse/RN Notified 10/16/20  Time Charge Nurse/RN Notified 0516  Document  Patient Outcome Not stable and remains on department  Progress note created (see row info) Yes

## 2020-10-16 NOTE — Anesthesia Procedure Notes (Signed)
Procedure Name: MAC Date/Time: 10/16/2020 3:10 PM Performed by: Inda Coke, CRNA Pre-anesthesia Checklist: Patient identified, Emergency Drugs available, Suction available, Timeout performed and Patient being monitored Patient Re-evaluated:Patient Re-evaluated prior to induction Oxygen Delivery Method: Nasal cannula Induction Type: IV induction Dental Injury: Teeth and Oropharynx as per pre-operative assessment

## 2020-10-16 NOTE — Op Note (Signed)
[facility] Patient Name: Jacob Gutierrez Procedure Date : 10/16/2020 MRN: [id number] Attending MD: Justice Britain , MD Date of Birth: 03-31-1986 CSN: [id number] Age: 35 Admit Type: Inpatient Procedure:                Upper GI endoscopy Indications:              Generalized abdominal pain, Dysphagia, Odynophagia,                            Suspected esophagitis, Diarrhea Providers:                Justice Britain, MD, Nelia Shi, RN,                            Tyna Jaksch Technician Referring MD:             Lajuan Lines. Pyrtle, MD, Hospital Medicine Service, [facility]                            Infectious DIsease Medicines:                Monitored Anesthesia Care Complications:            No immediate complications. Estimated Blood Loss:     Estimated blood loss was minimal. Procedure:                Pre-Anesthesia Assessment:                           - Prior to the procedure, a History and Physical                            was performed, and patient medications and                            allergies were reviewed. The patient's tolerance of                            previous anesthesia was also reviewed. The risks                            and benefits of the procedure and the sedation                            options and risks were discussed with the patient.                            All questions were answered, and informed consent                            was obtained. Prior Anticoagulants: The patient has                            taken no previous anticoagulant or antiplatelet  agents. ASA Grade Assessment: III - A patient with                            severe systemic disease. After reviewing the risks                            and benefits, the patient was deemed in                            satisfactory condition to undergo the procedure.                           After obtaining informed consent, the endoscope  was                            passed under direct vision. Throughout the                            procedure, the patient's blood pressure, pulse, and                            oxygen saturations were monitored continuously. The                            GIF-H190 ([id number] Olympus gastroscope was                            introduced through the mouth, and advanced to the                            second part of duodenum. The upper GI endoscopy was                            accomplished without difficulty. The patient                            tolerated the procedure. Scope In: Scope Out: Findings:      LA Grade C (one or more mucosal breaks continuous between tops of 2 or       more mucosal folds, less than 75% circumference) esophagitis with       bleeding was found in the entire esophagus.      Diffuse, white plaques were found in the entire esophagus.      Biopsies were taken with a cold forceps in the entire esophagus for       histology.      The Z-line was regular and was found 40 cm from the incisors.      A 4 cm hiatal hernia was present.      Segmental and patchy moderate inflammation characterized by erythema and       granularity was found in the gastric antrum. Biopsies were taken with a       cold forceps for histology and Helicobacter pylori testing.      No gross lesions were noted in the duodenal bulb, in the first portion       of the  duodenum and in the second portion of the duodenum. Biopsies were       taken with a cold forceps for histology. Impression:               - LA Grade C esophagitis with bleeding.                           - Esophageal plaques were found, consistent with                            candidiasis.                           - Biopsies were taken with a cold forceps for                            histology in the entire esophagus.                           - Z-line regular, 40 cm from the incisors.                           - 4 cm  hiatal hernia.                           - Gastritis. Biopsied.                           - No gross lesions in the duodenal bulb, in the                            first portion of the duodenum and in the second                            portion of the duodenum. Biopsied. Recommendation:           - The patient will be observed post-procedure,                            until all discharge criteria are met.                           - Return patient to hospital ward for ongoing care.                           - Full liquid diet and advance as tolerated.                           - Recommend IV Diflucan v Micafungin (as per ID).                           - Recommend IV PPI BID.                           - Observe patient's clinical course.                           -  Await pathology results.                           - Continue antibiotic therapy for Shigella/EIEC.                           - GI will not plan to see over weekend unless other                            issues develop. Pathology likely won't be back                            until early next week. Inpatient colonoscopy, not                            likely to be pursued due to current infectious                            issues. We will plan to follow up on Monday if                            patient is still around. If issues arise over                            weekend, please contact Dr. Mann/Dr. Benson [place] who are                            covering for [place] patients this weekend.                           - The findings and recommendations were discussed                            with the patient.                           - The findings and recommendations were discussed                            with the referring physician. Procedure Code(s):        --- Professional ---                           [id number], Esophagogastroduodenoscopy, flexible,                            transoral; with biopsy, single or  multiple Diagnosis Code(s):        --- Professional ---                           K20.91, Esophagitis, unspecified with bleeding                           K22.9, Disease of esophagus, unspecified  K44.9, Diaphragmatic hernia without obstruction or                            gangrene                           K29.70, Gastritis, unspecified, without bleeding                           R10.84, Generalized abdominal pain                           R13.10, Dysphagia, unspecified                           R19.7, Diarrhea, unspecified CPT copyright 2019 [employer]. All rights reserved. The codes documented in this report are preliminary and upon coder review may  be revised to meet current compliance requirements. Justice Britain, MD 10/16/2020 4:52:04 PM Number of Addenda: 0

## 2020-10-16 NOTE — Anesthesia Preprocedure Evaluation (Addendum)
Anesthesia Evaluation  Patient identified by MRN, date of birth, ID band Patient awake    Reviewed: Allergy & Precautions, NPO status , Patient's Chart, lab work & pertinent test results  Airway Mallampati: III  TM Distance: >3 FB Neck ROM: Full    Dental  (+) Poor Dentition, Loose,    Pulmonary Current Smoker and Patient abstained from smoking.,  H/o COVID   Pulmonary exam normal breath sounds clear to auscultation       Cardiovascular negative cardio ROS Normal cardiovascular exam Rhythm:Regular Rate:Normal  ECG: SR, rate 73   Neuro/Psych  Headaches, negative psych ROS   GI/Hepatic Neg liver ROS, Gastroenteritis dysphagia   Endo/Other  Hypokalemia (chronic), currently 2.4 (K+ has been ordered by primary team)  Renal/GU Renal disease     Musculoskeletal negative musculoskeletal ROS (+)   Abdominal   Peds  Hematology  (+) anemia , HIV, AIDS   Anesthesia Other Findings Dysphagia Esophagitis  Reproductive/Obstetrics                            Anesthesia Physical Anesthesia Plan  ASA: III  Anesthesia Plan: MAC   Post-op Pain Management:    Induction: Intravenous  PONV Risk Score and Plan: 0 and Propofol infusion and Treatment may vary due to age or medical condition  Airway Management Planned: Nasal Cannula  Additional Equipment:   Intra-op Plan:   Post-operative Plan:   Informed Consent: I have reviewed the patients History and Physical, chart, labs and discussed the procedure including the risks, benefits and alternatives for the proposed anesthesia with the patient or authorized representative who has indicated his/her understanding and acceptance.     Dental advisory given  Plan Discussed with: CRNA  Anesthesia Plan Comments: ( )       Anesthesia Quick Evaluation

## 2020-10-16 NOTE — Progress Notes (Signed)
PROGRESS NOTE                                                                             PROGRESS NOTE                                                                                                                                                                                                             Patient Demographics:    Jacob Gutierrez, is a 35 y.o. male, DOB - 03-Nov-1985, WLS:[id number]  Outpatient Primary MD for the patient is Patient, No Pcp Per    LOS - 1  Admit date - 10/15/2020    No chief complaint on file.      Brief Narrative     HPI: Jacob Gutierrez is a 35 y.o. male with medical history significant of AIDS(last CD4 count < 35) with prior infections include Shigella gastroenteritis, syphilis, coccidiomycosis, and recent COVID-19 infection in 07/2020 presents with complaints of nausea, vomiting, and diarrhea for the last month.  Patient reports feels like stuff gets stuck in his chest when eating and drinking causing him to vomit.  Emesis intermittently all have specks of blood present.  His stools have been watery and intermittently seeing bright red blood present.  He had been seen by [facility] sometime last week in plan to do EGD and colonoscopy 3/28.  Patient is currently been taking Stribild and Prezista, but reports that he has likely missed some doses with nausea and vomiting.  Exam with sent to the hospital further evaluation.  Does report patient has lost weight about 15-18 pounds over the last 1 month along with left-sided abdominal pain.  ED Course: Upon admission into the emergency department patient was noted to be afebrile with respirations 11-23, and all other vital signs maintained.  Labs significant for hemoglobin 12.2, MCV 101, potassium 2.8, and albumin 2.6.  Urinalysis was positive for large leukocytes positive nitrates, many bacteria, greater than 50 WBCs.  CT scan of the chest, abdomen, and pelvis revealed diffuse  thickening of the whole  colon concerning for colitis as well scattered pulmonary opacities concerning mild atypical infection.  Patient had been given 4 mEq of potassium chloride IV, Protonix 40 mg IV, Zofran, and lactic Ringer IV fluids.    Subjective:    Jacob Gutierrez today reports watery diarrhea, and odynophagia.     Assessment  & Plan :    Principal Problem:   Nausea, vomiting, and diarrhea Active Problems:   AIDS (acquired immune deficiency syndrome) (HCC)   Protein-calorie malnutrition, severe   Acute pyelonephritis   Hypokalemia   Colitis   Dysphagia  Nausea, vomiting, and diarrhea  colitis:  -GI pathogen panel showing Shigella enteroinvasive,  -Antibiotics management per ID, continue with Rocephin and Flagyl . -Keep on IV fluids.  Dysphagia: Acute.   -Plan for endoscopy today  Pyelonephritis: Acute - . Urinalysis was positive for signs of infection.  CT imaging significant for right renal enhancement concerning for asending infection. -Follow-up urine culture -Continue empiric antibiotics of Rocephin IV   AIDS -Magement per ID, resumed on  Symtuza -Restarted on Bactrim for PCP prevention - Abnormal CT of the chest: Patient noted to have scattered pulmonary opacities concerning for atypical infection along with other pulmonary nodules.  He had recently had COVID-19 in January of this year.  Hypokalemia: Acute.  - repeleting, significantly low 2.4 this morning, repleted p.o. and IV  Macrocytic anemia: Acute. -  Continue to monitor, and drop this morning due to IV hydration  Severe protein calorie malnutrition: Acute on chronic.  Albumin 2.6. -Closely for refeeding syndrome, phosphorus within normal limit   GERD with esophagitis -Continue pharmacy substitution of Protonix for omeprazole  Polysubstance use: Patient admits to using marijuana and tobacco. -Counseled on need of cessation of tobacco and marijuana use  DVT prophylaxis: SCDs  SpO2: 97  %  Recent Labs  Lab 10/15/20 1036 10/15/20 1220 10/15/20 1438 10/16/20 0500  WBC 4.1  --   --  3.9*  PLT 218  --   --  164  AST 32  --   --   --   ALT 16  --   --   --   ALKPHOS 60  --   --   --   BILITOT 0.7  --   --   --   ALBUMIN 2.6*  --   --   --   INR  --  1.1  --   --   LATICACIDVEN  --  0.9 1.1  --        ABG     Component Value Date/Time   HCO3 22.7 10/16/2020 0600   TCO2 30 10/15/2020 1252   ACIDBASEDEF 1.5 10/16/2020 0600   O2SAT 92.6 10/16/2020 0600          Condition - Extremely Guarded  Family Communication  :  None at bedside  Code Status :  Full  Consults  :  GI,ID  Procedures  :  none  PUD Prophylaxis :   Disposition Plan  :    Status is: Inpatient  Remains inpatient appropriate because:IV treatments appropriate due to intensity of illness or inability to take PO   Dispo: The patient is from: Home              Anticipated d/c is to: Home              Patient currently is not medically stable to d/c.   Difficult to place patient No      DVT Prophylaxis  :  SCDs   Lab Results  Component Value Date   PLT 164 10/16/2020    Diet :  Diet Order            Diet NPO time specified Except for: Ice Chips  Diet effective midnight                  Inpatient Medications  Scheduled Meds: . [MAR Hold] Darunavir-Cobicisctat-Emtricitabine-Tenofovir Alafenamide  1 tablet Oral Q breakfast  . [MAR Hold] lipase/protease/amylase  72,000 Units Oral TID WC  . [MAR Hold] metroNIDAZOLE  500 mg Oral Q8H  . [MAR Hold] pantoprazole  40 mg Oral BID  . [MAR Hold] potassium chloride  20 mEq Oral Daily  . [MAR Hold] potassium chloride  40 mEq Oral Q6H  . [MAR Hold] sulfamethoxazole-trimethoprim  1 tablet Oral Daily   Continuous Infusions: . sodium chloride    . [MAR Hold] cefTRIAXone (ROCEPHIN)  IV 2 g (10/15/20 1849)  . lactated ringers 125 mL/hr at 10/15/20 1932  . [MAR Hold] potassium chloride     PRN Meds:.[MAR Hold] acetaminophen,  [MAR Hold] albuterol, [MAR Hold] lipase/protease/amylase, [MAR Hold] ondansetron (ZOFRAN) IV  Antibiotics  :    Anti-infectives (From admission, onward)   Start     Dose/Rate Route Frequency Ordered Stop   10/16/20 1215  [MAR Hold]  sulfamethoxazole-trimethoprim (BACTRIM) 400-80 MG per tablet 1 tablet        (MAR Hold since Fri 10/16/2020 at 1329.Hold Reason: Transfer to a Procedural area.)   1 tablet Oral Daily 10/16/20 1115     10/16/20 0800  [MAR Hold]  Darunavir-Cobicisctat-Emtricitabine-Tenofovir Alafenamide (SYMTUZA) 800-150-200-10 MG TABS 1 tablet        (MAR Hold since Fri 10/16/2020 at 1329.Hold Reason: Transfer to a Procedural area.)   1 tablet Oral Daily with breakfast 10/15/20 1822     10/15/20 1615  [MAR Hold]  cefTRIAXone (ROCEPHIN) 2 g in sodium chloride 0.9 % 100 mL IVPB        (MAR Hold since Fri 10/16/2020 at 1329.Hold Reason: Transfer to a Procedural area.)   2 g 200 mL/hr over 30 Minutes Intravenous Every 24 hours 10/15/20 1608     10/15/20 1615  [MAR Hold]  metroNIDAZOLE (FLAGYL) tablet 500 mg        (MAR Hold since Fri 10/16/2020 at 1329.Hold Reason: Transfer to a Procedural area.)   500 mg Oral Every 8 hours 10/15/20 1608     10/15/20 1330  cefTRIAXone (ROCEPHIN) 2 g in sodium chloride 0.9 % 100 mL IVPB  Status:  Discontinued        2 g 200 mL/hr over 30 Minutes Intravenous  Once 10/15/20 1320 10/15/20 1603        Jacob Gutierrez M.D on 10/16/2020 at 2:31 PM  To page go to [email or web address]   [facility] -  Office  [phone]     Objective:   Vitals:   10/16/20 0900 10/16/20 1202 10/16/20 1342 10/16/20 1344  BP: (!) 90/56 (!) 92/58 104/61 (!) 101/57  Pulse: 82 87 87 86  Resp: Temp: 98.8 F (37.1 C) 97.8 F (36.6 C) 99.2 F (37.3 C)   TempSrc: Oral Oral Oral   SpO2:  96% 97%   Weight:   66.9 kg   Height:    (1.854 m)     Wt Readings from Last 3 Encounters:  10/16/20 66.9 kg  10/15/20 64.9 kg  10/08/20 68.2 kg      Intake/Output Summary (Last 24  hours) at 10/16/2020 1431 Last data filed at 10/16/2020 [id number] Gross per 24 hour  Intake 2337.23 ml  Output 325 ml  Net 2012.23 ml     Physical Exam  Awake Alert, No new F.N deficits, frail, chronically ill-appearing, normal affect Symmetrical Chest wall movement, Good air movement bilaterally, CTAB RRR,No Gallops,Rubs or new Murmurs, No Parasternal Heave +ve B.Sounds, Abd Soft, No tenderness. No Cyanosis, Clubbing or edema, No new Rash or bruise      Data Review:    CBC Recent Labs  Lab 10/15/20 1036 10/15/20 1252 10/16/20 0500  WBC 4.1  --  3.9*  HGB 12.2* 11.9* 9.8*  HCT 39.0 35.0* 29.5*  PLT 218  --  164  MCV 101.0*  --  93.1  MCH 31.6  --  30.9  MCHC 31.3  --  33.2  RDW 16.9*  --  16.4*    Recent Labs  Lab 10/15/20 1036 10/15/20 1220 10/15/20 1252 10/15/20 1438 10/16/20 0500 10/16/20 1016  NA 137  --  138  --  135  --   K 2.8*  --  2.9*  --  2.4* 3.1*  CL 102  --   --   --  103  --   CO2 25  --   --   --  23  --   GLUCOSE 91  --   --   --  86  --   BUN 12  --   --   --  8  --   CREATININE 1.01  --   --   --  1.03  --   CALCIUM 8.5*  --   --   --  7.9*  --   AST 32  --   --   --   --   --   ALT 16  --   --   --   --   --   ALKPHOS 60  --   --   --   --   --   BILITOT 0.7  --   --   --   --   --   ALBUMIN 2.6*  --   --   --   --   --   MG  --   --   --   --  2.1  --   LATICACIDVEN  --  0.9  --  1.1  --   --   INR  --  1.1  --   --   --   --     ------------------------------------------------------------------------------------------------------------------ No results for input(s): CHOL, HDL, LDLCALC, TRIG, CHOLHDL, LDLDIRECT in the last 72 hours.  No results found for: HGBA1C ------------------------------------------------------------------------------------------------------------------ No results for input(s): TSH, T4TOTAL, T3FREE, THYROIDAB in the last 72 hours.  Invalid input(s): FREET3  Cardiac  Enzymes No results for input(s): CKMB, TROPONINI, MYOGLOBIN in the last 168 hours.  Invalid input(s): CK ------------------------------------------------------------------------------------------------------------------ No results found for: BNP  Micro Results Recent Results (from the past 240 hour(s))  Culture, blood (routine x 2)     Status: None (Preliminary result)   Collection Time: 10/15/20 12:20 PM   Specimen: BLOOD LEFT FOREARM  Result Value Ref Range Status   Specimen Description BLOOD LEFT FOREARM  Final   Special Requests   Final    BOTTLES DRAWN AEROBIC AND ANAEROBIC Blood Culture adequate volume   Culture   Final    NO GROWTH < 24 HOURS Performed at [facility], [address] [place], [place] [address]  Report Status PENDING  Incomplete  Gastrointestinal Panel by PCR , Stool     Status: Abnormal   Collection Time: 10/15/20  2:04 PM   Specimen: Stool  Result Value Ref Range Status   Campylobacter species NOT DETECTED NOT DETECTED Final   Plesimonas shigelloides NOT DETECTED NOT DETECTED Final   Salmonella species NOT DETECTED NOT DETECTED Final   Yersinia enterocolitica NOT DETECTED NOT DETECTED Final   Vibrio species NOT DETECTED NOT DETECTED Final   Vibrio cholerae NOT DETECTED NOT DETECTED Final   Enteroaggregative E coli (EAEC) NOT DETECTED NOT DETECTED Final   Enteropathogenic E coli (EPEC) NOT DETECTED NOT DETECTED Final   Enterotoxigenic E coli (ETEC) NOT DETECTED NOT DETECTED Final   Shiga like toxin producing E coli (STEC) NOT DETECTED NOT DETECTED Final   Shigella/Enteroinvasive E coli (EIEC) DETECTED (A) NOT DETECTED Final    Comment: RESULT CALLED TO, READ BACK BY AND VERIFIED WITH: [facility] TETTEH RN [id number] 10/16/20 HNM    Cryptosporidium NOT DETECTED NOT DETECTED Final   Cyclospora cayetanensis NOT DETECTED NOT DETECTED Final   Entamoeba histolytica NOT DETECTED NOT DETECTED Final   Giardia lamblia NOT DETECTED NOT DETECTED Final   Adenovirus  F40/41 NOT DETECTED NOT DETECTED Final   Astrovirus NOT DETECTED NOT DETECTED Final   Norovirus GI/GII NOT DETECTED NOT DETECTED Final   Rotavirus A NOT DETECTED NOT DETECTED Final   Sapovirus (I, II, IV, and V) NOT DETECTED NOT DETECTED Final    Comment: Performed at [facility], [address] [place], [place] [address]  C Difficile Quick Screen w PCR reflex     Status: None   Collection Time: 10/15/20  2:04 PM   Specimen: Stool  Result Value Ref Range Status   C Diff antigen NEGATIVE NEGATIVE Final   C Diff toxin NEGATIVE NEGATIVE Final   C Diff interpretation No C. difficile detected.  Final    Comment: Performed at [facility], [address] [place], [place] [address]  Culture, blood (routine x 2)     Status: None (Preliminary result)   Collection Time: 10/15/20  2:30 PM   Specimen: BLOOD RIGHT ARM  Result Value Ref Range Status   Specimen Description BLOOD RIGHT ARM  Final   Special Requests   Final    BOTTLES DRAWN AEROBIC AND ANAEROBIC Blood Culture results may not be optimal due to an inadequate volume of blood received in culture bottles   Culture   Final    NO GROWTH < 24 HOURS Performed at [facility], [address] [place], [place] [address]    Report Status PENDING  Incomplete  MRSA PCR Screening     Status: None   Collection Time: 10/15/20  7:10 PM   Specimen: Nasal Mucosa; Nasopharyngeal  Result Value Ref Range Status   MRSA by PCR NEGATIVE NEGATIVE Final    Comment:        The GeneXpert MRSA Assay (FDA approved for NASAL specimens only), is one component of a comprehensive MRSA colonization surveillance program. It is not intended to diagnose MRSA infection nor to guide or monitor treatment for MRSA infections. Performed at [facility], [address], [place], [place] [address]     Radiology Reports CT Chest W Contrast  Result Date: 10/15/2020 CLINICAL DATA:  Nausea and vomiting. HIV. Persistent vomiting and  diarrhea with abdominal pain for 1 month. Cough. Weakness. EXAM: CT CHEST, ABDOMEN, AND PELVIS WITH CONTRAST TECHNIQUE: Multidetector CT imaging  of the chest, abdomen and pelvis was performed following the standard protocol during bolus administration of intravenous contrast. CONTRAST:  OMNIPAQUE IOHEXOL 300 MG/ML  SOLN COMPARISON:  Chest radiograph 08/07/2020. Abdominopelvic CT 02/20/2020. FINDINGS: CT CHEST FINDINGS Cardiovascular: Normal aortic caliber. Normal heart size, without pericardial effusion. No central pulmonary embolism, on this non-dedicated study. Mediastinum/Nodes: No mediastinal or hilar adenopathy. Subtle esophageal fluid level on 37/3. Mild esophageal wall thickening, especially superiorly on 16/3. Lungs/Pleura: No pleural fluid. Mild centrilobular and paraseptal emphysema. Scattered bilateral pulmonary nodules. Many of these, especially in the right upper lobe, are somewhat ill-defined and favored to be infectious/inflammatory. Examples in the right upper lobe on 77 and 84 of series 4. Clustered nodules are also identified within the posterior left upper lobe on 62/4 and 70/4. Other more well-circumscribed nodules are indeterminate. Example in the right lower lobe at 4 mm on 130/4 and the right upper lobe at 4 mm on 66/4. Musculoskeletal: No acute osseous abnormality. CT ABDOMEN PELVIS FINDINGS Hepatobiliary: Normal liver. Normal gallbladder, without biliary ductal dilatation. Pancreas: Normal, without mass or ductal dilatation. Spleen: Normal in size, without focal abnormality. Adrenals/Urinary Tract: Normal adrenal glands. Subtle new uroepithelial mucosal thickening and mild hyperenhancement including within the renal pelvis on 84/3. No hydroureter or bladder abnormality. No hydronephrosis or renal mass. Stomach/Bowel: Normal stomach, without wall thickening. Mild wall thickening and mucosal hyperenhancement within the left side of the colon, rectum, sigmoid. The transverse and ascending  colon are underdistended, but appear moderately thick walled with mucosal hyperenhancement. The terminal ileum is also underdistended but likely thickened including on 104/3. Normal small bowel. Vascular/Lymphatic: Normal aortic caliber. Prominent abdominal retroperitoneal nodes are similar to 02/13/2019 and likely reactive in the setting of HIV. Reproductive: Normal prostate. Other: No significant free fluid.  No free intraperitoneal air. Musculoskeletal: No acute osseous abnormality. IMPRESSION: 1. Diffuse colonic wall thickening with mild mucosal hyperenhancement. This could represent infection (exclude C difficile) or inflammation. Given suspicion of concurrent terminal ileal wall thickening, Crohn disease or ulcerative colitis should be considered. 2. Scattered pulmonary opacities, primarily favored to represent mild atypical infection of indeterminate acuity. Other pulmonary nodules are technically indeterminate. No follow-up needed if patient is low-risk. Non-contrast chest CT can be considered in 12 months if patient is high-risk. This recommendation follows the consensus statement: Guidelines for Management of Incidental Pulmonary Nodules Detected on CT Images: From the Fleischner Society 2017; Radiology 2017; 284:228-243. 3. Esophageal wall thickening is mild, suggesting esophagitis. Esophageal air fluid level suggests dysmotility or gastroesophageal reflux. 4. Emphysema 5. Subtle urothelial mucosal hyperenhancement and wall thickening,primarily in the right renal pelvis. Correlate with urinalysis to exclude ascending infection. Electronically Signed   By: Jeronimo Greaves M.D.   On: 10/15/2020 14:36   CT Abdomen Pelvis W Contrast  Result Date: 10/15/2020 CLINICAL DATA:  Nausea and vomiting. HIV. Persistent vomiting and diarrhea with abdominal pain for 1 month. Cough. Weakness. EXAM: CT CHEST, ABDOMEN, AND PELVIS WITH CONTRAST TECHNIQUE: Multidetector CT imaging of the chest, abdomen and pelvis was  performed following the standard protocol during bolus administration of intravenous contrast. CONTRAST:  OMNIPAQUE IOHEXOL 300 MG/ML  SOLN COMPARISON:  Chest radiograph 08/07/2020. Abdominopelvic CT 02/20/2020. FINDINGS: CT CHEST FINDINGS Cardiovascular: Normal aortic caliber. Normal heart size, without pericardial effusion. No central pulmonary embolism, on this non-dedicated study. Mediastinum/Nodes: No mediastinal or hilar adenopathy. Subtle esophageal fluid level on 37/3. Mild esophageal wall thickening, especially superiorly on 16/3. Lungs/Pleura: No pleural fluid. Mild centrilobular and paraseptal emphysema. Scattered bilateral pulmonary nodules.  Many of these, especially in the right upper lobe, are somewhat ill-defined and favored to be infectious/inflammatory. Examples in the right upper lobe on 77 and 84 of series 4. Clustered nodules are also identified within the posterior left upper lobe on 62/4 and 70/4. Other more well-circumscribed nodules are indeterminate. Example in the right lower lobe at 4 mm on 130/4 and the right upper lobe at 4 mm on 66/4. Musculoskeletal: No acute osseous abnormality. CT ABDOMEN PELVIS FINDINGS Hepatobiliary: Normal liver. Normal gallbladder, without biliary ductal dilatation. Pancreas: Normal, without mass or ductal dilatation. Spleen: Normal in size, without focal abnormality. Adrenals/Urinary Tract: Normal adrenal glands. Subtle new uroepithelial mucosal thickening and mild hyperenhancement including within the renal pelvis on 84/3. No hydroureter or bladder abnormality. No hydronephrosis or renal mass. Stomach/Bowel: Normal stomach, without wall thickening. Mild wall thickening and mucosal hyperenhancement within the left side of the colon, rectum, sigmoid. The transverse and ascending colon are underdistended, but appear moderately thick walled with mucosal hyperenhancement. The terminal ileum is also underdistended but likely thickened including on 104/3. Normal  small bowel. Vascular/Lymphatic: Normal aortic caliber. Prominent abdominal retroperitoneal nodes are similar to 02/13/2019 and likely reactive in the setting of HIV. Reproductive: Normal prostate. Other: No significant free fluid.  No free intraperitoneal air. Musculoskeletal: No acute osseous abnormality. IMPRESSION: 1. Diffuse colonic wall thickening with mild mucosal hyperenhancement. This could represent infection (exclude C difficile) or inflammation. Given suspicion of concurrent terminal ileal wall thickening, Crohn disease or ulcerative colitis should be considered. 2. Scattered pulmonary opacities, primarily favored to represent mild atypical infection of indeterminate acuity. Other pulmonary nodules are technically indeterminate. No follow-up needed if patient is low-risk. Non-contrast chest CT can be considered in 12 months if patient is high-risk. This recommendation follows the consensus statement: Guidelines for Management of Incidental Pulmonary Nodules Detected on CT Images: From the Fleischner Society 2017; Radiology 2017; 284:228-243. 3. Esophageal wall thickening is mild, suggesting esophagitis. Esophageal air fluid level suggests dysmotility or gastroesophageal reflux. 4. Emphysema 5. Subtle urothelial mucosal hyperenhancement and wall thickening,primarily in the right renal pelvis. Correlate with urinalysis to exclude ascending infection. Electronically Signed   By: Jeronimo Greaves M.D.   On: 10/15/2020 14:36

## 2020-10-16 NOTE — Progress Notes (Signed)
Restart septra SS 1 daily for PJP prophylaxis per Dr. Daiva Eves.  Ulyses Southward, PharmD, BCIDP, AAHIVP, CPP Infectious Disease Pharmacist 10/16/2020 11:16 AM

## 2020-10-16 NOTE — Progress Notes (Signed)
Subjective: Diarrhea is better since having been on antibiotics   Antibiotics:  Anti-infectives (From admission, onward)   Start     Dose/Rate Route Frequency Ordered Stop   10/16/20 1215  sulfamethoxazole-trimethoprim (BACTRIM) 400-80 MG per tablet 1 tablet        1 tablet Oral Daily 10/16/20 1115     10/16/20 0800  Darunavir-Cobicisctat-Emtricitabine-Tenofovir Alafenamide (SYMTUZA) 800-150-200-10 MG TABS 1 tablet        1 tablet Oral Daily with breakfast 10/15/20 1822     10/15/20 1615  cefTRIAXone (ROCEPHIN) 2 g in sodium chloride 0.9 % 100 mL IVPB        2 g 200 mL/hr over 30 Minutes Intravenous Every 24 hours 10/15/20 1608     10/15/20 1615  metroNIDAZOLE (FLAGYL) tablet 500 mg        500 mg Oral Every 8 hours 10/15/20 1608     10/15/20 1330  cefTRIAXone (ROCEPHIN) 2 g in sodium chloride 0.9 % 100 mL IVPB  Status:  Discontinued        2 g 200 mL/hr over 30 Minutes Intravenous  Once 10/15/20 1320 10/15/20 1603      Medications: Scheduled Meds: . Darunavir-Cobicisctat-Emtricitabine-Tenofovir Alafenamide  1 tablet Oral Q breakfast  . lipase/protease/amylase  72,000 Units Oral TID WC  . metroNIDAZOLE  500 mg Oral Q8H  . pantoprazole  40 mg Oral BID  . potassium chloride  20 mEq Oral Daily  . potassium chloride  40 mEq Oral Q6H  . sulfamethoxazole-trimethoprim  1 tablet Oral Daily   Continuous Infusions: . sodium chloride    . cefTRIAXone (ROCEPHIN)  IV 2 g (10/15/20 1849)  . lactated ringers 125 mL/hr at 10/15/20 1932  . potassium chloride     PRN Meds:.acetaminophen, albuterol, lipase/protease/amylase, ondansetron (ZOFRAN) IV    Objective: Weight change:   Intake/Output Summary (Last 24 hours) at 10/16/2020 1246 Last data filed at 10/16/2020 1610 Gross per 24 hour  Intake 2337.23 ml  Output 325 ml  Net 2012.23 ml   Blood pressure (!) 92/58, pulse 87, temperature 97.8 F (36.6 C), temperature source Oral, resp. rate 18, height 6\' 1"  (1.854 m), weight  66.9 kg, SpO2 96 %. Temp:  [97.8 F (36.6 C)-101 F (38.3 C)] 97.8 F (36.6 C) (03/25 1202) Pulse Rate:  [76-101] 87 (03/25 1202) Resp:  [11-19] 18 (03/25 1202) BP: (89-116)/(52-86) 92/58 (03/25 1202) SpO2:  [92 %-100 %] 96 % (03/25 1202) Weight:  [66.9 kg] 66.9 kg (03/24 1901)  Physical Exam: Physical Exam Constitutional:      Appearance: He is toxic-appearing.  HENT:     Head: Normocephalic and atraumatic.  Cardiovascular:     Rate and Rhythm: Tachycardia present.  Pulmonary:     Effort: Pulmonary effort is normal. No respiratory distress.     Breath sounds: No wheezing.  Abdominal:     General: Bowel sounds are normal. There is no distension.     Palpations: There is no mass.     Tenderness: There is abdominal tenderness.  Neurological:     General: No focal deficit present.     Mental Status: He is alert and oriented to person, place, and time.  Psychiatric:        Attention and Perception: Attention and perception normal.        Mood and Affect: Mood is depressed.        Speech: Speech normal.        Behavior: Behavior normal.  Thought Content: Thought content normal.        Cognition and Memory: Cognition and memory normal.        Judgment: Judgment normal.      CBC:    BMET Recent Labs    10/15/20 1036 10/15/20 1252 10/16/20 0500 10/16/20 1016  NA 137 138 135  --   K 2.8* 2.9* 2.4* 3.1*  CL 102  --  103  --   CO2 25  --  23  --   GLUCOSE 91  --  86  --   BUN 12  --  8  --   CREATININE 1.01  --  1.03  --   CALCIUM 8.5*  --  7.9*  --      Liver Panel  Recent Labs    10/15/20 1036  PROT 8.5*  ALBUMIN 2.6*  AST 32  ALT 16  ALKPHOS 60  BILITOT 0.7       Sedimentation Rate No results for input(s): ESRSEDRATE in the last 72 hours. C-Reactive Protein No results for input(s): CRP in the last 72 hours.  Micro Results: Recent Results (from the past 720 hour(s))  Ova and parasite examination     Status: None   Collection Time:  09/18/20 10:20 AM   Specimen: Stool  Result Value Ref Range Status   MICRO NUMBER: [id number]  Final   SPECIMEN QUALITY: Adequate  Final   Source STOOL  Final   STATUS: FINAL  Final   CONCENTRATE RESULT: No ova or parasites seen  Final   TRICHROME RESULT: No ova or parasites seen  Final   COMMENT:   Final    Routine Ova and Parasite exam may not detect some parasites that occasionally cause diarrheal illness. Cryptosporidium Antigen and/or Cyclospora and Isospora Exam may be ordered to detect these parasites. One negative sample does not necessarily rule out  the presence of a parasitic infection.  For additional information, please refer to https://education.[facility].com/faq/FAQ203 (This link is being provided for informational/ educational purposes only.)   Culture, blood (routine x 2)     Status: None (Preliminary result)   Collection Time: 10/15/20 12:20 PM   Specimen: BLOOD LEFT FOREARM  Result Value Ref Range Status   Specimen Description BLOOD LEFT FOREARM  Final   Special Requests   Final    BOTTLES DRAWN AEROBIC AND ANAEROBIC Blood Culture adequate volume   Culture   Final    NO GROWTH < 24 HOURS Performed at [facility], [address], [place], [place] [address]    Report Status PENDING  Incomplete  Gastrointestinal Panel by PCR , Stool     Status: Abnormal   Collection Time: 10/15/20  2:04 PM   Specimen: Stool  Result Value Ref Range Status   Campylobacter species NOT DETECTED NOT DETECTED Final   Plesimonas shigelloides NOT DETECTED NOT DETECTED Final   Salmonella species NOT DETECTED NOT DETECTED Final   Yersinia enterocolitica NOT DETECTED NOT DETECTED Final   Vibrio species NOT DETECTED NOT DETECTED Final   Vibrio cholerae NOT DETECTED NOT DETECTED Final   Enteroaggregative E coli (EAEC) NOT DETECTED NOT DETECTED Final   Enteropathogenic E coli (EPEC) NOT DETECTED NOT DETECTED Final   Enterotoxigenic E coli (ETEC) NOT DETECTED NOT DETECTED Final    Shiga like toxin producing E coli (STEC) NOT DETECTED NOT DETECTED Final   Shigella/Enteroinvasive E coli (EIEC) DETECTED (A) NOT DETECTED Final    Comment: RESULT CALLED TO, READ BACK BY AND VERIFIED WITH:  [facility] TETTEH RN 1610 10/16/20 HNM    Cryptosporidium NOT DETECTED NOT DETECTED Final   Cyclospora cayetanensis NOT DETECTED NOT DETECTED Final   Entamoeba histolytica NOT DETECTED NOT DETECTED Final   Giardia lamblia NOT DETECTED NOT DETECTED Final   Adenovirus F40/41 NOT DETECTED NOT DETECTED Final   Astrovirus NOT DETECTED NOT DETECTED Final   Norovirus GI/GII NOT DETECTED NOT DETECTED Final   Rotavirus A NOT DETECTED NOT DETECTED Final   Sapovirus (I, II, IV, and V) NOT DETECTED NOT DETECTED Final    Comment: Performed at [facility], [address], [place], [place] [address]  C Difficile Quick Screen w PCR reflex     Status: None   Collection Time: 10/15/20  2:04 PM   Specimen: Stool  Result Value Ref Range Status   C Diff antigen NEGATIVE NEGATIVE Final   C Diff toxin NEGATIVE NEGATIVE Final   C Diff interpretation No C. difficile detected.  Final    Comment: Performed at [facility], [address] [place], [place] [address]  Culture, blood (routine x 2)     Status: None (Preliminary result)   Collection Time: 10/15/20  2:30 PM   Specimen: BLOOD RIGHT ARM  Result Value Ref Range Status   Specimen Description BLOOD RIGHT ARM  Final   Special Requests   Final    BOTTLES DRAWN AEROBIC AND ANAEROBIC Blood Culture results may not be optimal due to an inadequate volume of blood received in culture bottles   Culture   Final    NO GROWTH < 24 HOURS Performed at [facility], [address], [place], [place] [address]    Report Status PENDING  Incomplete  MRSA PCR Screening     Status: None   Collection Time: 10/15/20  7:10 PM   Specimen: Nasal Mucosa; Nasopharyngeal  Result Value Ref Range Status   MRSA by PCR NEGATIVE NEGATIVE Final    Comment:         The GeneXpert MRSA Assay (FDA approved for NASAL specimens only), is one component of a comprehensive MRSA colonization surveillance program. It is not intended to diagnose MRSA infection nor to guide or monitor treatment for MRSA infections. Performed at [facility], [address] [place], [place] [address]     Studies/Results: CT Chest W Contrast  Result Date: 10/15/2020 CLINICAL DATA:  Nausea and vomiting. HIV. Persistent vomiting and diarrhea with abdominal pain for 1 month. Cough. Weakness. EXAM: CT CHEST, ABDOMEN, AND PELVIS WITH CONTRAST TECHNIQUE: Multidetector CT imaging of the chest, abdomen and pelvis was performed following the standard protocol during bolus administration of intravenous contrast. CONTRAST:  OMNIPAQUE IOHEXOL 300 MG/ML  SOLN COMPARISON:  Chest radiograph 08/07/2020. Abdominopelvic CT 02/20/2020. FINDINGS: CT CHEST FINDINGS Cardiovascular: Normal aortic caliber. Normal heart size, without pericardial effusion. No central pulmonary embolism, on this non-dedicated study. Mediastinum/Nodes: No mediastinal or hilar adenopathy. Subtle esophageal fluid level on 37/3. Mild esophageal wall thickening, especially superiorly on 16/3. Lungs/Pleura: No pleural fluid. Mild centrilobular and paraseptal emphysema. Scattered bilateral pulmonary nodules. Many of these, especially in the right upper lobe, are somewhat ill-defined and favored to be infectious/inflammatory. Examples in the right upper lobe on 77 and 84 of series 4. Clustered nodules are also identified within the posterior left upper lobe on 62/4 and 70/4. Other more well-circumscribed nodules are indeterminate. Example in the right lower lobe at 4 mm on 130/4 and the right upper lobe at 4 mm on 66/4. Musculoskeletal:  No acute osseous abnormality. CT ABDOMEN PELVIS FINDINGS Hepatobiliary: Normal liver. Normal gallbladder, without biliary ductal dilatation. Pancreas: Normal, without mass or ductal dilatation.  Spleen: Normal in size, without focal abnormality. Adrenals/Urinary Tract: Normal adrenal glands. Subtle new uroepithelial mucosal thickening and mild hyperenhancement including within the renal pelvis on 84/3. No hydroureter or bladder abnormality. No hydronephrosis or renal mass. Stomach/Bowel: Normal stomach, without wall thickening. Mild wall thickening and mucosal hyperenhancement within the left side of the colon, rectum, sigmoid. The transverse and ascending colon are underdistended, but appear moderately thick walled with mucosal hyperenhancement. The terminal ileum is also underdistended but likely thickened including on 104/3. Normal small bowel. Vascular/Lymphatic: Normal aortic caliber. Prominent abdominal retroperitoneal nodes are similar to 02/13/2019 and likely reactive in the setting of HIV. Reproductive: Normal prostate. Other: No significant free fluid.  No free intraperitoneal air. Musculoskeletal: No acute osseous abnormality. IMPRESSION: 1. Diffuse colonic wall thickening with mild mucosal hyperenhancement. This could represent infection (exclude C difficile) or inflammation. Given suspicion of concurrent terminal ileal wall thickening, Crohn disease or ulcerative colitis should be considered. 2. Scattered pulmonary opacities, primarily favored to represent mild atypical infection of indeterminate acuity. Other pulmonary nodules are technically indeterminate. No follow-up needed if patient is low-risk. Non-contrast chest CT can be considered in 12 months if patient is high-risk. This recommendation follows the consensus statement: Guidelines for Management of Incidental Pulmonary Nodules Detected on CT Images: From the Fleischner Society 2017; Radiology 2017; 284:228-243. 3. Esophageal wall thickening is mild, suggesting esophagitis. Esophageal air fluid level suggests dysmotility or gastroesophageal reflux. 4. Emphysema 5. Subtle urothelial mucosal hyperenhancement and wall thickening,primarily  in the right renal pelvis. Correlate with urinalysis to exclude ascending infection. Electronically Signed   By: Jeronimo GreavesKyle  Talbot M.D.   On: 10/15/2020 14:36   CT Abdomen Pelvis W Contrast  Result Date: 10/15/2020 CLINICAL DATA:  Nausea and vomiting. HIV. Persistent vomiting and diarrhea with abdominal pain for 1 month. Cough. Weakness. EXAM: CT CHEST, ABDOMEN, AND PELVIS WITH CONTRAST TECHNIQUE: Multidetector CT imaging of the chest, abdomen and pelvis was performed following the standard protocol during bolus administration of intravenous contrast. CONTRAST:  100mL OMNIPAQUE IOHEXOL 300 MG/ML  SOLN COMPARISON:  Chest radiograph 08/07/2020. Abdominopelvic CT 02/20/2020. FINDINGS: CT CHEST FINDINGS Cardiovascular: Normal aortic caliber. Normal heart size, without pericardial effusion. No central pulmonary embolism, on this non-dedicated study. Mediastinum/Nodes: No mediastinal or hilar adenopathy. Subtle esophageal fluid level on 37/3. Mild esophageal wall thickening, especially superiorly on 16/3. Lungs/Pleura: No pleural fluid. Mild centrilobular and paraseptal emphysema. Scattered bilateral pulmonary nodules. Many of these, especially in the right upper lobe, are somewhat ill-defined and favored to be infectious/inflammatory. Examples in the right upper lobe on 77 and 84 of series 4. Clustered nodules are also identified within the posterior left upper lobe on 62/4 and 70/4. Other more well-circumscribed nodules are indeterminate. Example in the right lower lobe at 4 mm on 130/4 and the right upper lobe at 4 mm on 66/4. Musculoskeletal: No acute osseous abnormality. CT ABDOMEN PELVIS FINDINGS Hepatobiliary: Normal liver. Normal gallbladder, without biliary ductal dilatation. Pancreas: Normal, without mass or ductal dilatation. Spleen: Normal in size, without focal abnormality. Adrenals/Urinary Tract: Normal adrenal glands. Subtle new uroepithelial mucosal thickening and mild hyperenhancement including within the  renal pelvis on 84/3. No hydroureter or bladder abnormality. No hydronephrosis or renal mass. Stomach/Bowel: Normal stomach, without wall thickening. Mild wall thickening and mucosal hyperenhancement within the left side of the colon, rectum, sigmoid. The transverse and ascending colon are underdistended,  but appear moderately thick walled with mucosal hyperenhancement. The terminal ileum is also underdistended but likely thickened including on 104/3. Normal small bowel. Vascular/Lymphatic: Normal aortic caliber. Prominent abdominal retroperitoneal nodes are similar to 02/13/2019 and likely reactive in the setting of HIV. Reproductive: Normal prostate. Other: No significant free fluid.  No free intraperitoneal air. Musculoskeletal: No acute osseous abnormality. IMPRESSION: 1. Diffuse colonic wall thickening with mild mucosal hyperenhancement. This could represent infection (exclude C difficile) or inflammation. Given suspicion of concurrent terminal ileal wall thickening, Crohn disease or ulcerative colitis should be considered. 2. Scattered pulmonary opacities, primarily favored to represent mild atypical infection of indeterminate acuity. Other pulmonary nodules are technically indeterminate. No follow-up needed if patient is low-risk. Non-contrast chest CT can be considered in 12 months if patient is high-risk. This recommendation follows the consensus statement: Guidelines for Management of Incidental Pulmonary Nodules Detected on CT Images: From the Fleischner Society 2017; Radiology 2017; 284:228-243. 3. Esophageal wall thickening is mild, suggesting esophagitis. Esophageal air fluid level suggests dysmotility or gastroesophageal reflux. 4. Emphysema 5. Subtle urothelial mucosal hyperenhancement and wall thickening,primarily in the right renal pelvis. Correlate with urinalysis to exclude ascending infection. Electronically Signed   By: Jeronimo Greaves M.D.   On: 10/15/2020 14:36       Assessment/Plan:  INTERVAL HISTORY: Stool pathogen panel came back positive for Shigella/enteroinvasive E. Coli  he has improved on ceftriaxone after not   Principal Problem:   Nausea, vomiting, and diarrhea Active Problems:   AIDS (acquired immune deficiency syndrome) (HCC)   Protein-calorie malnutrition, severe   Acute pyelonephritis   Hypokalemia   Colitis   Dysphagia    Jacob Gutierrez is a 35 y.o. male with poorly controlled HIV with AIDS, cachexia weight loss nausea vomiting diarrhea.  He also has pulmonary nodules on CT scan  1.  Diarrhea: CT scan showed evidence of colitis and GI pathogen panel shows Shigella enteroinvasive E. Coli was similarly seen on GI pathogen panel in February.  Would continue ceftriaxone and metronidazole  2.  Dysphagia and nausea and vomiting:  Is to have EGD done today with gastroenterology.  Please send tissue in sterile containers without any fixative's that can be sent for herpes simplex PCR and CMV PCR  #3 pulmonary nodules cryptococcal antigen is negative the plasma antigen and Blastomyces antigen are pending from urine  #4 AIDS continue SYMTUZA and Bactrim is been restarted for PCP prevention  My partner Dr. Drue Second is available for questions this weekend and will check up on his cultures and studies.  Dr. Luciana Axe and Dr. Elinor Parkinson will be here on Monday.   LOS: 1 day   Acey Lav 10/16/2020, 12:46 PM

## 2020-10-16 NOTE — Progress Notes (Signed)
Received a call that pt's GI panel is positive for Shirgella & Enteroinvasive e.coli. MD On call notified.

## 2020-10-16 NOTE — H&P (Signed)
GASTROENTEROLOGY PROCEDURE H&P NOTE   Primary Care Physician: Patient, No Pcp Per  HPI: Jacob Gutierrez is a 35 y.o. male who presents for EGD for evaluation of dysphagia and abdominal pain (though has colitis from Shigella/EIEC).   Past Medical History:  Diagnosis Date  . AIDS (acquired immune deficiency syndrome) (HCC)   . AIDS (acquired immune deficiency syndrome) (HCC)   . 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 H/O coccidioidomycosis    pulmonary   . HIV disease (HCC)   . Past history of allergy to penicillin-type antibiotic 07-03-2012   desensitization  . PNA (pneumonia)   . Shigella gastroenteritis   . Syphilis    history /treated    Past Surgical History:  Procedure Laterality Date  . TRANSURETHRAL RESECTION OF PROSTATE N/A 12/09/2017   Procedure: TRANSURETHRAL RESECTION OF THE PROSTATE (TURP);  Surgeon: Crist Fat, MD;  Location: [facility];  Service: Urology;  Laterality: N/A;   Current Facility-Administered Medications  Medication Dose Route Frequency Provider Last Rate Last Admin  . 0.9 %  sodium chloride infusion   Intravenous Continuous Armbruster, Willaim Rayas, MD      . acetaminophen (TYLENOL) tablet 650 mg  650 mg Oral Q6H PRN Opyd, Lavone Neri, MD   650 mg at 10/16/20 0410  . albuterol (PROVENTIL) (2.5 MG/3ML) 0.083% nebulizer solution 2.5 mg  2.5 mg Nebulization Q6H PRN Smith, Rondell A, MD      . cefTRIAXone (ROCEPHIN) 2 g in sodium chloride 0.9 % 100 mL IVPB  2 g Intravenous Q24H Daiva Eves, Lisette Grinder, MD 200 mL/hr at 10/15/20 1849 2 g at 10/15/20 1849  . Darunavir-Cobicisctat-Emtricitabine-Tenofovir Alafenamide (SYMTUZA) 800-150-200-10 MG TABS 1 tablet  1 tablet Oral Q breakfast Clydie Braun, MD   1 tablet at 10/16/20 0836  . lactated ringers infusion   Intravenous Continuous Clydie Braun, MD 125 mL/hr at 10/15/20 1932 New Bag at 10/15/20 1932  . lipase/protease/amylase (CREON) capsule 36,000 Units  36,000 Units  Oral PRN Madelyn Flavors A, MD      . lipase/protease/amylase (CREON) capsule 72,000 Units  72,000 Units Oral TID WC Clydie Braun, MD   72,000 Units at 10/15/20 1847  . metroNIDAZOLE (FLAGYL) tablet 500 mg  500 mg Oral Q8H Daiva Eves, Lisette Grinder, MD   500 mg at 10/16/20 0507  . ondansetron (ZOFRAN) injection 4 mg  4 mg Intravenous Q6H PRN Opyd, Lavone Neri, MD   4 mg at 10/15/20 2307  . pantoprazole (PROTONIX) EC tablet 40 mg  40 mg Oral BID Madelyn Flavors A, MD   40 mg at 10/16/20 0836  . potassium chloride (KLOR-CON) CR tablet 20 mEq  20 mEq Oral Daily Madelyn Flavors A, MD   20 mEq at 10/16/20 0836  . potassium chloride 10 mEq in 100 mL IVPB  10 mEq Intravenous Q1 Hr x 2 Pham, Minh Q, RPH-CPP      . potassium chloride SA (KLOR-CON) CR tablet 40 mEq  40 mEq Oral Q6H Elgergawy, Leana Roe, MD   40 mEq at 10/16/20 0953  . sulfamethoxazole-trimethoprim (BACTRIM) 400-80 MG per tablet 1 tablet  1 tablet Oral Daily Pham, Minh Q, RPH-CPP       Allergies  Allergen Reactions  . Vancomycin Anaphylaxis, Itching, Swelling and Other (See Comments)    Angioedema and "everything swells"  . Amoxicillin Other (See Comments)    From childhood: "I had a reaction when i was little." (??)   Family History  Problem Relation Age of Onset  . Hypertension Mother   . Lupus Maternal Grandmother   . Diabetes Maternal Grandmother   . Cancer Paternal Grandfather        unknown   . Prostate cancer Father   . Diabetes Father   . Diabetes Maternal Grandfather   . Colon cancer Neg Hx   . Stomach cancer Neg Hx   . Esophageal cancer Neg Hx   . Pancreatic cancer Neg Hx    Social History   Socioeconomic History  . Marital status: Single    Spouse name: Not on file  . Number of children: Not on file  . Years of education: Not on file  . Highest education level: Not on file  Occupational History  . Not on file  Tobacco Use  . Smoking status: Current Every Day Smoker    Packs/day: 0.30    Types: Cigarettes  .  Smokeless tobacco: Never Used  . Tobacco comment: cutting back  Vaping Use  . Vaping Use: Never used  Substance and Sexual Activity  . Alcohol use: Not Currently    Alcohol/week: 1.0 standard drink    Types: 1 Standard drinks or equivalent per week    Comment: whiskey occasionally   . Drug use: Not Currently    Frequency: 1.0 times per week    Types: Marijuana  . Sexual activity: Yes    Partners: Female, Male    Birth control/protection: Condom    Comment: "I have plenty of condoms, need lube"  Other Topics Concern  . Not on file  Social History Narrative  . Not on file   Social Determinants of Health   Financial Resource Strain: Not on file  Food Insecurity: Not on file  Transportation Needs: Not on file  Physical Activity: Not on file  Stress: Not on file  Social Connections: Not on file  Intimate Partner Violence: Not on file    Physical Exam: Vital signs in last 24 hours: Temp:  [97.8 F (36.6 C)-101 F (38.3 C)] 99.1 F (37.3 C) (03/25 0720) Pulse Rate:  [76-101] 85 (03/25 0720) Resp:  [11-23] 19 (03/25 0720) BP: (89-116)/(52-86) 89/52 (03/25 0720) SpO2:  [92 %-100 %] 96 % (03/25 0720) Weight:  [66.9 kg] 66.9 kg (03/24 1901) Last BM Date: 10/15/20 GEN: NAD EYE: Sclerae anicteric ENT: MMM CV: Non-tachycardic GI: Soft, TTP throughout abdomen NEURO:  Alert & Oriented x 3  Lab Results: Recent Labs    10/15/20 1036 10/15/20 1252 10/16/20 0500  WBC 4.1  --  3.9*  HGB 12.2* 11.9* 9.8*  HCT 39.0 35.0* 29.5*  PLT 218  --  164   BMET Recent Labs    10/15/20 1036 10/15/20 1252 10/16/20 0500 10/16/20 1016  NA 137 138 135  --   K 2.8* 2.9* 2.4* 3.1*  CL 102  --  103  --   CO2 25  --  23  --   GLUCOSE 91  --  86  --   BUN 12  --  8  --   CREATININE 1.01  --  1.03  --   CALCIUM 8.5*  --  7.9*  --    LFT Recent Labs    10/15/20 1036  PROT 8.5*  ALBUMIN 2.6*  AST 32  ALT 16  ALKPHOS 60  BILITOT 0.7   PT/INR Recent Labs    10/15/20 1220   LABPROT 13.9  INR 1.1     Impression / Plan: This is a 35 y.o.male who  presents for EGD for evaluation of dysphagia and abdominal pain (though has colitis from Shigella/EIEC).   The risks and benefits of endoscopic evaluation were discussed with the patient; these include but are not limited to the risk of perforation, infection, bleeding, missed lesions, lack of diagnosis, severe illness requiring hospitalization, as well as anesthesia and sedation related illnesses.  The patient is agreeable to proceed.    Corliss Parish, MD [facility] Office # [phone]

## 2020-10-16 NOTE — Transfer of Care (Signed)
Immediate Anesthesia Transfer of Care Note  Patient: Jacob Gutierrez  Procedure(s) Performed: ESOPHAGOGASTRODUODENOSCOPY (EGD) WITH PROPOFOL (N/A ) BIOPSY  Patient Location: Endoscopy Unit  Anesthesia Type:MAC  Level of Consciousness: awake and drowsy  Airway & Oxygen Therapy: Patient Spontanous Breathing and Patient connected to nasal cannula oxygen  Post-op Assessment: Report given to RN and Post -op Vital signs reviewed and stable  Post vital signs: Reviewed and stable  Last Vitals:  Vitals Value Taken Time  BP 97/53 10/16/20 1533  Temp    Pulse 100 10/16/20 1534  Resp 31 10/16/20 1534  SpO2 100 % 10/16/20 1534  Vitals shown include unvalidated device data.  Last Pain:  Vitals:   10/16/20 1342  TempSrc: Oral  PainSc: 0-No pain         Complications: No complications documented.

## 2020-10-16 NOTE — Progress Notes (Addendum)
Pharmacy Antibiotic Note  Jacob Gutierrez is a 35 y.o. male admitted on 10/15/2020 with poorly controlled HIV with nausea, vomiting, and diarrhea.  Pharmacy has been consulted for fluconazole dosing.  CT shows colitis and GI panel indicated shigella/enteroinvasive e.coli currently being treated with ceftriaxone and flagyl. Also restarted bactrim for pcp px. ID requesting the addition of fluconazole for esophageal candidiasis.   Plan: Fluconazole 400mg  daily - will give IV tonight then convert to po in am  Height: 6\' 1"  (185.4 cm) Weight: 66.9 kg (147 lb 7.8 oz) IBW/kg (Calculated) : 79.9  Temp (24hrs), Avg:99.2 F (37.3 C), Min:97.8 F (36.6 C), Max:101 F (38.3 C)  Recent Labs  Lab 10/15/20 1036 10/15/20 1220 10/15/20 1438 10/16/20 0500  WBC 4.1  --   --  3.9*  CREATININE 1.01  --   --  1.03  LATICACIDVEN  --  0.9 1.1  --     Estimated Creatinine Clearance: 92.9 mL/min (by C-G formula based on SCr of 1.03 mg/dL).    Allergies  Allergen Reactions  . Vancomycin Anaphylaxis, Itching, Swelling and Other (See Comments)    Angioedema and "everything swells"  . Amoxicillin Other (See Comments)    From childhood: "I had a reaction when i was little." (??)   Thank you for allowing pharmacy to be a part of this patient's care.  10/17/20 PharmD., BCPS Clinical Pharmacist 10/16/2020 5:03 PM

## 2020-10-17 ENCOUNTER — Encounter (HOSPITAL_COMMUNITY): Payer: Self-pay | Admitting: Gastroenterology

## 2020-10-17 LAB — CBC
HCT: 27.6 % — ABNORMAL LOW (ref 39.0–52.0)
Hemoglobin: 9.2 g/dL — ABNORMAL LOW (ref 13.0–17.0)
MCH: 31 pg (ref 26.0–34.0)
MCHC: 33.3 g/dL (ref 30.0–36.0)
MCV: 92.9 fL (ref 80.0–100.0)
Platelets: 157 10*3/uL (ref 150–400)
RBC: 2.97 MIL/uL — ABNORMAL LOW (ref 4.22–5.81)
RDW: 17 % — ABNORMAL HIGH (ref 11.5–15.5)
WBC: 3.5 10*3/uL — ABNORMAL LOW (ref 4.0–10.5)
nRBC: 0 % (ref 0.0–0.2)

## 2020-10-17 LAB — BASIC METABOLIC PANEL
Anion gap: 7 (ref 5–15)
BUN: 8 mg/dL (ref 6–20)
CO2: 20 mmol/L — ABNORMAL LOW (ref 22–32)
Calcium: 7.7 mg/dL — ABNORMAL LOW (ref 8.9–10.3)
Chloride: 107 mmol/L (ref 98–111)
Creatinine, Ser: 1.1 mg/dL (ref 0.61–1.24)
GFR, Estimated: >60 mL/min (ref >60)
Glucose, Bld: 114 mg/dL — ABNORMAL HIGH (ref 70–99)
Potassium: 3.1 mmol/L — ABNORMAL LOW (ref 3.5–5.1)
Sodium: 134 mmol/L — ABNORMAL LOW (ref 135–145)

## 2020-10-17 LAB — CMV ANTIBODY, IGG (EIA): CMV Ab - IgG: >10 U/mL — ABNORMAL HIGH (ref 0.00–0.59)

## 2020-10-17 LAB — CMV IGM: CMV IgM: <30 AU/mL (ref 0.0–29.9)

## 2020-10-17 LAB — PHOSPHORUS: Phosphorus: 3 mg/dL (ref 2.5–4.6)

## 2020-10-17 LAB — MAGNESIUM: Magnesium: 1.9 mg/dL (ref 1.7–2.4)

## 2020-10-17 MED ORDER — POTASSIUM CHLORIDE 10 MEQ/100ML IV SOLN
10.0000 meq | INTRAVENOUS | Status: AC
  Administered 2020-10-17 (×2): 10 meq via INTRAVENOUS
  Filled 2020-10-17 (×4): qty 100

## 2020-10-17 NOTE — Anesthesia Postprocedure Evaluation (Signed)
Anesthesia Post Note  Patient: Jacob Gutierrez  Procedure(s) Performed: ESOPHAGOGASTRODUODENOSCOPY (EGD) WITH PROPOFOL (N/A ) BIOPSY     Patient location during evaluation: Endoscopy Anesthesia Type: MAC Level of consciousness: awake Pain management: pain level controlled Vital Signs Assessment: post-procedure vital signs reviewed and stable Respiratory status: spontaneous breathing Cardiovascular status: stable Postop Assessment: no apparent nausea or vomiting Anesthetic complications: no   No complications documented.  Last Vitals:  Vitals:   10/17/20 0342 10/17/20 0726  BP: 99/61 105/66  Pulse: 94 71  Resp: 17 (!) 22  Temp: 37.2 C 36.8 C  SpO2: 99%     Last Pain:  Vitals:   10/17/20 0800  TempSrc:   PainSc: 0-No pain                 Huston Foley

## 2020-10-17 NOTE — Progress Notes (Addendum)
Sent chat message to Dr. Barron Alvine, patient wants diet advanced c/p EGD on 10/16/2020.  His response:  I'm not on service. Please reach out to whoever is covering GI this weekend  Texted GI Upper Level Resident at [phone]  10/17/2000 12:00:  GI Upper Level Resident never returned the call.  Dr. Randol Kern placed order to advance patient's diet.

## 2020-10-17 NOTE — Progress Notes (Signed)
PROGRESS NOTE                                                                             PROGRESS NOTE                                                                                                                                                                                                             Patient Demographics:    Jacob Gutierrez, is a 35 y.o. male, DOB - Jul 19, 1986, MOQ:[id number]  Outpatient Primary MD for the patient is Patient, No Pcp Per    LOS - 2  Admit date - 10/15/2020    No chief complaint on file.      Brief Narrative    35 y.o. male with medical history significant of AIDS(last CD4 count < 35) with prior infections include Shigella gastroenteritis, syphilis, coccidiomycosis, and recent COVID-19 infection in 07/2020 presents with complaints of nausea, vomiting, and diarrhea for the last month.    Patient presents with diarrhea, dysphagia, CT abdomen pelvis significant for colitis, GI pathogen significant for Shigella, he had endoscopy 3/25 which was significant for severe esophagitis.  .     Subjective:    Jacob Gutierrez today ports appetite has improved, requesting to advance his diet, reports he is still present, but improved, had 2 watery bowel movements since overnight.     Assessment  & Plan :    Principal Problem:   Nausea, vomiting, and diarrhea Active Problems:   AIDS (acquired immune deficiency syndrome) (HCC)   Protein-calorie malnutrition, severe   Acute pyelonephritis   Hypokalemia   Colitis   Dysphagia  Nausea, vomiting, and diarrhea  colitis:  -GI pathogen panel showing Shigella enteroinvasive,  -Antibiotics management per ID, continue with Rocephin and Flagyl . -Keep on IV fluids.  Appetite has been improving  Dysphagia: Acute.   -status  post endoscopy 3/25, significant for grade C esophagitis with bleeding found in the entire esophagus, with diffuse white plaques in the entire  esophagus. -Continue with Diflucan and IV Protonix per GI  recommendations. -Tolerating full liquid diet, asking to advance his diet today, will try him on soft diet.  Pyelonephritis: Acute - . Urinalysis was positive for signs of infection.  CT imaging significant for right renal enhancement concerning for asending infection. -Follow-up urine culture -Continue empiric antibiotics of Rocephin IV   AIDS -Magement per ID, resumed on  Symtuza -Restarted on Bactrim for PCP prevention - Abnormal CT of the chest: Patient noted to have scattered pulmonary opacities concerning for atypical infection along with other pulmonary nodules.  He had recently had COVID-19 in January of this year.  Hypokalemia: Acute.  - repeleting, magnesium and phosphorus within normal limit, monitor closely for eating syndrome.  Macrocytic anemia: Acute. -  Continue to monitor, and drop this morning due to IV hydration  Severe protein calorie malnutrition: Acute on chronic.  Albumin 2.6. -Closely for refeeding syndrome, phosphorus within normal limit   Polysubstance use: Patient admits to using marijuana and tobacco. -Counseled on need of cessation of tobacco and marijuana use  DVT prophylaxis: SCDs  SpO2: 99 %  Recent Labs  Lab 10/15/20 1036 10/15/20 1220 10/15/20 1438 10/16/20 0500 10/17/20 0113  WBC 4.1  --   --  3.9* 3.5*  PLT 218  --   --  164 157  AST 32  --   --   --   --   ALT 16  --   --   --   --   ALKPHOS 60  --   --   --   --   BILITOT 0.7  --   --   --   --   ALBUMIN 2.6*  --   --   --   --   INR  --  1.1  --   --   --   LATICACIDVEN  --  0.9 1.1  --   --        ABG     Component Value Date/Time   HCO3 22.7 10/16/2020 0600   TCO2 30 10/15/2020 1252   ACIDBASEDEF 1.5 10/16/2020 0600   O2SAT 92.6 10/16/2020 0600          Condition - Extremely Guarded  Family Communication  :  None at bedside  Code Status :  Full  Consults  :  GI,ID  Procedures  :  none  PUD  Prophylaxis :   Disposition Plan  :    Status is: Inpatient  Remains inpatient appropriate because:IV treatments appropriate due to intensity of illness or inability to take PO   Dispo: The patient is from: Home              Anticipated d/c is to: Home              Patient currently is not medically stable to d/c.   Difficult to place patient No      DVT Prophylaxis  :   SCDs   Lab Results  Component Value Date   PLT 157 10/17/2020    Diet :  Diet Order            DIET SOFT Room service appropriate? Yes; Fluid consistency: Thin  Diet effective now                  Inpatient Medications  Scheduled Meds: . Darunavir-Cobicisctat-Emtricitabine-Tenofovir Alafenamide  1 tablet Oral Q breakfast  . fluconazole  400 mg Oral Daily  . lipase/protease/amylase  72,000 Units Oral TID WC  . metroNIDAZOLE  500 mg Oral Q8H  .  pantoprazole (PROTONIX) IV  40 mg Intravenous Q12H  . potassium chloride  20 mEq Oral Daily  . sulfamethoxazole-trimethoprim  1 tablet Oral Daily   Continuous Infusions: . cefTRIAXone (ROCEPHIN)  IV 2 g (10/16/20 1737)   PRN Meds:.acetaminophen, albuterol, lipase/protease/amylase, ondansetron (ZOFRAN) IV  Antibiotics  :    Anti-infectives (From admission, onward)   Start     Dose/Rate Route Frequency Ordered Stop   10/17/20 1000  fluconazole (DIFLUCAN) tablet 400 mg        400 mg Oral Daily 10/16/20 1709     10/16/20 1800  fluconazole (DIFLUCAN) IVPB 400 mg        400 mg 100 mL/hr over 120 Minutes Intravenous  Once 10/16/20 1709 10/16/20 2043   10/16/20 1215  sulfamethoxazole-trimethoprim (BACTRIM) 400-80 MG per tablet 1 tablet        1 tablet Oral Daily 10/16/20 1115     10/16/20 0800  Darunavir-Cobicisctat-Emtricitabine-Tenofovir Alafenamide (SYMTUZA) 800-150-200-10 MG TABS 1 tablet        1 tablet Oral Daily with breakfast 10/15/20 1822     10/15/20 1615  cefTRIAXone (ROCEPHIN) 2 g in sodium chloride 0.9 % 100 mL IVPB        2 g 200 mL/hr over  30 Minutes Intravenous Every 24 hours 10/15/20 1608     10/15/20 1615  metroNIDAZOLE (FLAGYL) tablet 500 mg        500 mg Oral Every 8 hours 10/15/20 1608     10/15/20 1330  cefTRIAXone (ROCEPHIN) 2 g in sodium chloride 0.9 % 100 mL IVPB  Status:  Discontinued        2 g 200 mL/hr over 30 Minutes Intravenous  Once 10/15/20 1320 10/15/20 1603        Paulo Keimig M.D on 10/17/2020 at 1:37 PM  To page go to [email or web address]   [facility] -  Office  [phone]     Objective:   Vitals:   10/16/20 2001 10/16/20 2348 10/17/20 0342 10/17/20 0726  BP: (!) 110/52 (!) 90/55 99/61 105/66  Pulse: 91 91 94 71  Resp: 12 18 17  (!) 22  Temp: 98.9 F (37.2 C) 98.9 F (37.2 C) 98.9 F (37.2 C) 98.3 F (36.8 C)  TempSrc: Oral Axillary Axillary Oral  SpO2: 100% 100% 99%   Weight:      Height:        Wt Readings from Last 3 Encounters:  10/16/20 66.9 kg  10/15/20 64.9 kg  10/08/20 68.2 kg     Intake/Output Summary (Last 24 hours) at 10/17/2020 1337 Last data filed at 10/17/2020 0342 Gross per 24 hour  Intake 470.83 ml  Output 550 ml  Net -79.17 ml     Physical Exam  Awake Alert, Oriented X 3, frail, chronically ill-appearing male, laying in bed Symmetrical Chest wall movement, Good air movement bilaterally, CTAB RRR,No Gallops,Rubs or new Murmurs, No Parasternal Heave +ve B.Sounds, Abd Soft, No tenderness, No rebound - guarding or rigidity. No Cyanosis, Clubbing or edema, No new Rash or bruise       Data Review:    CBC Recent Labs  Lab 10/15/20 1036 10/15/20 1252 10/16/20 0500 10/17/20 0113  WBC 4.1  --  3.9* 3.5*  HGB 12.2* 11.9* 9.8* 9.2*  HCT 39.0 35.0* 29.5* 27.6*  PLT 218  --  164 157  MCV 101.0*  --  93.1 92.9  MCH 31.6  --  30.9 31.0  MCHC 31.3  --  33.2 33.3  RDW 16.9*  --  16.4*  17.0*    Recent Labs  Lab 10/15/20 1036 10/15/20 1220 10/15/20 1252 10/15/20 1438 10/16/20 0500 10/16/20 1016 10/16/20 1830 10/17/20 0113  NA 137  --  138   --  135  --  135 134*  K 2.8*  --  2.9*  --  2.4* 3.1* 2.9* 3.1*  CL 102  --   --   --  103  --  106 107  CO2 25  --   --   --  23  --  22 20*  GLUCOSE 91  --   --   --  86  --  151* 114*  BUN 12  --   --   --  8  --  8 8  CREATININE 1.01  --   --   --  1.03  --  1.03 1.10  CALCIUM 8.5*  --   --   --  7.9*  --  7.7* 7.7*  AST 32  --   --   --   --   --   --   --   ALT 16  --   --   --   --   --   --   --   ALKPHOS 60  --   --   --   --   --   --   --   BILITOT 0.7  --   --   --   --   --   --   --   ALBUMIN 2.6*  --   --   --   --   --   --   --   MG  --   --   --   --  2.1  --   --  1.9  LATICACIDVEN  --  0.9  --  1.1  --   --   --   --   INR  --  1.1  --   --   --   --   --   --     ------------------------------------------------------------------------------------------------------------------ No results for input(s): CHOL, HDL, LDLCALC, TRIG, CHOLHDL, LDLDIRECT in the last 72 hours.  No results found for: HGBA1C ------------------------------------------------------------------------------------------------------------------ No results for input(s): TSH, T4TOTAL, T3FREE, THYROIDAB in the last 72 hours.  Invalid input(s): FREET3  Cardiac Enzymes No results for input(s): CKMB, TROPONINI, MYOGLOBIN in the last 168 hours.  Invalid input(s): CK ------------------------------------------------------------------------------------------------------------------ No results found for: BNP  Micro Results Recent Results (from the past 240 hour(s))  Culture, blood (routine x 2)     Status: None (Preliminary result)   Collection Time: 10/15/20 12:20 PM   Specimen: BLOOD LEFT FOREARM  Result Value Ref Range Status   Specimen Description BLOOD LEFT FOREARM  Final   Special Requests   Final    BOTTLES DRAWN AEROBIC AND ANAEROBIC Blood Culture adequate volume   Culture   Final    NO GROWTH < 24 HOURS Performed at [facility], [address] [place], [place] [address]     Report Status PENDING  Incomplete  Gastrointestinal Panel by PCR , Stool     Status: Abnormal   Collection Time: 10/15/20  2:04 PM   Specimen: Stool  Result Value Ref Range Status   Campylobacter species NOT DETECTED NOT DETECTED Final   Plesimonas shigelloides NOT DETECTED NOT DETECTED Final   Salmonella species NOT DETECTED NOT DETECTED Final   Yersinia enterocolitica NOT DETECTED NOT DETECTED Final   Vibrio species NOT DETECTED NOT  DETECTED Final   Vibrio cholerae NOT DETECTED NOT DETECTED Final   Enteroaggregative E coli (EAEC) NOT DETECTED NOT DETECTED Final   Enteropathogenic E coli (EPEC) NOT DETECTED NOT DETECTED Final   Enterotoxigenic E coli (ETEC) NOT DETECTED NOT DETECTED Final   Shiga like toxin producing E coli (STEC) NOT DETECTED NOT DETECTED Final   Shigella/Enteroinvasive E coli (EIEC) DETECTED (A) NOT DETECTED Final    Comment: RESULT CALLED TO, READ BACK BY AND VERIFIED WITH: [facility] TETTEH RN 0034 10/16/20 HNM    Cryptosporidium NOT DETECTED NOT DETECTED Final   Cyclospora cayetanensis NOT DETECTED NOT DETECTED Final   Entamoeba histolytica NOT DETECTED NOT DETECTED Final   Giardia lamblia NOT DETECTED NOT DETECTED Final   Adenovirus F40/41 NOT DETECTED NOT DETECTED Final   Astrovirus NOT DETECTED NOT DETECTED Final   Norovirus GI/GII NOT DETECTED NOT DETECTED Final   Rotavirus A NOT DETECTED NOT DETECTED Final   Sapovirus (I, II, IV, and V) NOT DETECTED NOT DETECTED Final    Comment: Performed at [facility], [address] [place], [place] [address]  C Difficile Quick Screen w PCR reflex     Status: None   Collection Time: 10/15/20  2:04 PM   Specimen: Stool  Result Value Ref Range Status   C Diff antigen NEGATIVE NEGATIVE Final   C Diff toxin NEGATIVE NEGATIVE Final   C Diff interpretation No C. difficile detected.  Final    Comment: Performed at [facility], [address], [place], [place] [address]  Culture, blood (routine x 2)      Status: None (Preliminary result)   Collection Time: 10/15/20  2:30 PM   Specimen: BLOOD RIGHT ARM  Result Value Ref Range Status   Specimen Description BLOOD RIGHT ARM  Final   Special Requests   Final    BOTTLES DRAWN AEROBIC AND ANAEROBIC Blood Culture results may not be optimal due to an inadequate volume of blood received in culture bottles   Culture   Final    NO GROWTH < 24 HOURS Performed at [facility], [address], [place], [place] [address]    Report Status PENDING  Incomplete  MRSA PCR Screening     Status: None   Collection Time: 10/15/20  7:10 PM   Specimen: Nasal Mucosa; Nasopharyngeal  Result Value Ref Range Status   MRSA by PCR NEGATIVE NEGATIVE Final    Comment:        The GeneXpert MRSA Assay (FDA approved for NASAL specimens only), is one component of a comprehensive MRSA colonization surveillance program. It is not intended to diagnose MRSA infection nor to guide or monitor treatment for MRSA infections. Performed at [facility], [address], [place], [place] [address]     Radiology Reports CT Chest W Contrast  Result Date: 10/15/2020 CLINICAL DATA:  Nausea and vomiting. HIV. Persistent vomiting and diarrhea with abdominal pain for 1 month. Cough. Weakness. EXAM: CT CHEST, ABDOMEN, AND PELVIS WITH CONTRAST TECHNIQUE: Multidetector CT imaging of the chest, abdomen and pelvis was performed following the standard protocol during bolus administration of intravenous contrast. CONTRAST:  OMNIPAQUE IOHEXOL 300 MG/ML  SOLN COMPARISON:  Chest radiograph 08/07/2020. Abdominopelvic CT 02/20/2020. FINDINGS: CT CHEST FINDINGS Cardiovascular: Normal aortic caliber. Normal heart size, without pericardial effusion. No central pulmonary embolism, on this non-dedicated study. Mediastinum/Nodes: No mediastinal or hilar adenopathy. Subtle esophageal fluid level on 37/3. Mild esophageal wall thickening, especially superiorly on 16/3. Lungs/Pleura: No  pleural fluid. Mild centrilobular and paraseptal emphysema. Scattered bilateral pulmonary nodules. Many of these, especially in the right upper lobe, are somewhat ill-defined and favored to be infectious/inflammatory. Examples in the right upper lobe on 77 and 84 of series 4. Clustered nodules are also identified within the posterior left upper lobe on 62/4 and 70/4. Other more well-circumscribed nodules are indeterminate. Example in the right lower lobe at 4 mm on 130/4 and the right upper lobe at 4 mm on 66/4. Musculoskeletal: No acute osseous abnormality. CT ABDOMEN PELVIS FINDINGS Hepatobiliary: Normal liver. Normal gallbladder, without biliary ductal dilatation. Pancreas: Normal, without mass or ductal dilatation. Spleen: Normal in size, without focal abnormality. Adrenals/Urinary Tract: Normal adrenal glands. Subtle new uroepithelial mucosal thickening and mild hyperenhancement including within the renal pelvis on 84/3. No hydroureter or bladder abnormality. No hydronephrosis or renal mass. Stomach/Bowel: Normal stomach, without wall thickening. Mild wall thickening and mucosal hyperenhancement within the left side of the colon, rectum, sigmoid. The transverse and ascending colon are underdistended, but appear moderately thick walled with mucosal hyperenhancement. The terminal ileum is also underdistended but likely thickened including on 104/3. Normal small bowel. Vascular/Lymphatic: Normal aortic caliber. Prominent abdominal retroperitoneal nodes are similar to 02/13/2019 and likely reactive in the setting of HIV. Reproductive: Normal prostate. Other: No significant free fluid.  No free intraperitoneal air. Musculoskeletal: No acute osseous abnormality. IMPRESSION: 1. Diffuse colonic wall thickening with mild mucosal hyperenhancement. This could represent infection (exclude C difficile) or inflammation. Given suspicion of concurrent terminal ileal wall thickening, Crohn disease or ulcerative colitis should  be considered. 2. Scattered pulmonary opacities, primarily favored to represent mild atypical infection of indeterminate acuity. Other pulmonary nodules are technically indeterminate. No follow-up needed if patient is low-risk. Non-contrast chest CT can be considered in 12 months if patient is high-risk. This recommendation follows the consensus statement: Guidelines for Management of Incidental Pulmonary Nodules Detected on CT Images: From the Fleischner Society 2017; Radiology 2017; 284:228-243. 3. Esophageal wall thickening is mild, suggesting esophagitis. Esophageal air fluid level suggests dysmotility or gastroesophageal reflux. 4. Emphysema 5. Subtle urothelial mucosal hyperenhancement and wall thickening,primarily in the right renal pelvis. Correlate with urinalysis to exclude ascending infection. Electronically Signed   By: Jeronimo Greaves M.D.   On: 10/15/2020 14:36   CT Abdomen Pelvis W Contrast  Result Date: 10/15/2020 CLINICAL DATA:  Nausea and vomiting. HIV. Persistent vomiting and diarrhea with abdominal pain for 1 month. Cough. Weakness. EXAM: CT CHEST, ABDOMEN, AND PELVIS WITH CONTRAST TECHNIQUE: Multidetector CT imaging of the chest, abdomen and pelvis was performed following the standard protocol during bolus administration of intravenous contrast. CONTRAST:  OMNIPAQUE IOHEXOL 300 MG/ML  SOLN COMPARISON:  Chest radiograph 08/07/2020. Abdominopelvic CT 02/20/2020. FINDINGS: CT CHEST FINDINGS Cardiovascular: Normal aortic caliber. Normal heart size, without pericardial effusion. No central pulmonary embolism, on this non-dedicated study. Mediastinum/Nodes: No mediastinal or hilar adenopathy. Subtle esophageal fluid level on 37/3. Mild esophageal wall thickening, especially superiorly on 16/3. Lungs/Pleura: No pleural fluid. Mild centrilobular and paraseptal emphysema. Scattered bilateral pulmonary nodules. Many of these, especially in the right upper lobe, are somewhat ill-defined and favored  to be infectious/inflammatory. Examples in the right upper lobe on 77 and 84 of series 4. Clustered nodules are also identified within the posterior left upper lobe on 62/4 and 70/4. Other more well-circumscribed nodules are indeterminate. Example in the right lower lobe at 4 mm on 130/4 and the right upper lobe at 4 mm on 66/4. Musculoskeletal: No acute osseous abnormality. CT ABDOMEN  PELVIS FINDINGS Hepatobiliary: Normal liver. Normal gallbladder, without biliary ductal dilatation. Pancreas: Normal, without mass or ductal dilatation. Spleen: Normal in size, without focal abnormality. Adrenals/Urinary Tract: Normal adrenal glands. Subtle new uroepithelial mucosal thickening and mild hyperenhancement including within the renal pelvis on 84/3. No hydroureter or bladder abnormality. No hydronephrosis or renal mass. Stomach/Bowel: Normal stomach, without wall thickening. Mild wall thickening and mucosal hyperenhancement within the left side of the colon, rectum, sigmoid. The transverse and ascending colon are underdistended, but appear moderately thick walled with mucosal hyperenhancement. The terminal ileum is also underdistended but likely thickened including on 104/3. Normal small bowel. Vascular/Lymphatic: Normal aortic caliber. Prominent abdominal retroperitoneal nodes are similar to 02/13/2019 and likely reactive in the setting of HIV. Reproductive: Normal prostate. Other: No significant free fluid.  No free intraperitoneal air. Musculoskeletal: No acute osseous abnormality. IMPRESSION: 1. Diffuse colonic wall thickening with mild mucosal hyperenhancement. This could represent infection (exclude C difficile) or inflammation. Given suspicion of concurrent terminal ileal wall thickening, Crohn disease or ulcerative colitis should be considered. 2. Scattered pulmonary opacities, primarily favored to represent mild atypical infection of indeterminate acuity. Other pulmonary nodules are technically indeterminate. No  follow-up needed if patient is low-risk. Non-contrast chest CT can be considered in 12 months if patient is high-risk. This recommendation follows the consensus statement: Guidelines for Management of Incidental Pulmonary Nodules Detected on CT Images: From the Fleischner Society 2017; Radiology 2017; 284:228-243. 3. Esophageal wall thickening is mild, suggesting esophagitis. Esophageal air fluid level suggests dysmotility or gastroesophageal reflux. 4. Emphysema 5. Subtle urothelial mucosal hyperenhancement and wall thickening,primarily in the right renal pelvis. Correlate with urinalysis to exclude ascending infection. Electronically Signed   By: Jeronimo GreavesKyle  Talbot M.D.   On: 10/15/2020 14:36

## 2020-10-18 LAB — CBC
HCT: 30.1 % — ABNORMAL LOW (ref 39.0–52.0)
Hemoglobin: 9.7 g/dL — ABNORMAL LOW (ref 13.0–17.0)
MCH: 29.9 pg (ref 26.0–34.0)
MCHC: 32.2 g/dL (ref 30.0–36.0)
MCV: 92.9 fL (ref 80.0–100.0)
Platelets: 166 10*3/uL (ref 150–400)
RBC: 3.24 MIL/uL — ABNORMAL LOW (ref 4.22–5.81)
RDW: 16.8 % — ABNORMAL HIGH (ref 11.5–15.5)
WBC: 3.2 10*3/uL — ABNORMAL LOW (ref 4.0–10.5)
nRBC: 0 % (ref 0.0–0.2)

## 2020-10-18 LAB — BASIC METABOLIC PANEL
Anion gap: 6 (ref 5–15)
BUN: 12 mg/dL (ref 6–20)
CO2: 24 mmol/L (ref 22–32)
Calcium: 7.9 mg/dL — ABNORMAL LOW (ref 8.9–10.3)
Chloride: 103 mmol/L (ref 98–111)
Creatinine, Ser: 1.23 mg/dL (ref 0.61–1.24)
GFR, Estimated: >60 mL/min (ref >60)
Glucose, Bld: 149 mg/dL — ABNORMAL HIGH (ref 70–99)
Potassium: 3.4 mmol/L — ABNORMAL LOW (ref 3.5–5.1)
Sodium: 133 mmol/L — ABNORMAL LOW (ref 135–145)

## 2020-10-18 LAB — PHOSPHORUS: Phosphorus: 3.2 mg/dL (ref 2.5–4.6)

## 2020-10-18 LAB — MAGNESIUM: Magnesium: 2.1 mg/dL (ref 1.7–2.4)

## 2020-10-18 MED ORDER — LACTATED RINGERS IV SOLN: INTRAVENOUS | Status: DC

## 2020-10-18 MED ORDER — POTASSIUM CHLORIDE CRYS ER 20 MEQ PO TBCR
40.0000 meq | EXTENDED_RELEASE_TABLET | Freq: Once | ORAL | Status: AC
  Administered 2020-10-18: 40 meq via ORAL
  Filled 2020-10-18: qty 2

## 2020-10-18 NOTE — Progress Notes (Signed)
PROGRESS NOTE                                                                             PROGRESS NOTE                                                                                                                                                                                                             Jacob Gutierrez Demographics:    Jacob Gutierrez, is a 35 y.o. male, DOB - 1986/07/09, ZOX:[id number]  Outpatient Primary MD for the Jacob Gutierrez is Jacob Gutierrez, No Pcp Per    LOS - 3  Admit date - 10/15/2020    No chief complaint on file.      Brief Narrative    35 y.o. male with medical history significant of AIDS(last CD4 count < 35) with prior infections include Shigella gastroenteritis, syphilis, coccidiomycosis, and recent COVID-19 infection in 07/2020 presents with complaints of nausea, vomiting, and diarrhea for the last month.    Jacob Gutierrez presents with diarrhea, dysphagia, CT abdomen pelvis significant for colitis, GI pathogen significant for Shigella, he had endoscopy 3/25 which was significant for severe esophagitis.  .    Subjective:    Jacob Gutierrez today reports 4-5 episodes of diarrhea over the last 24 hours, but reports it is getting more formed, reports he is tolerating soft diet.     Assessment  & Plan :    Principal Problem:   Nausea, vomiting, and diarrhea Active Problems:   AIDS (acquired immune deficiency syndrome) (HCC)   Protein-calorie malnutrition, severe   Acute pyelonephritis   Hypokalemia   Colitis   Dysphagia  Nausea, vomiting, and diarrhea  colitis:  -GI pathogen panel showing Shigella enteroinvasive,  -Antibiotics management per ID, continue with Rocephin and Flagyl . -Keep on IV fluids.  Appetite has been improving -Remains with significant diarrhea, creatinine slightly trending up, so will resume back on fluid LR, 75 cc/h.  Dysphagia: Acute.   -status  post endoscopy 3/25, significant for grade C esophagitis with  bleeding found in the entire esophagus,  with diffuse white plaques in the entire esophagus. -Continue with Diflucan and IV Protonix per GI recommendations. -Tolerating soft diet  Pyelonephritis: Acute - . Urinalysis was positive for signs of infection.  CT imaging significant for right renal enhancement concerning for asending infection. -Follow-up urine culture -Continue empiric antibiotics of Rocephin IV   AIDS -Magement per ID, resumed on  Symtuza -Restarted on Bactrim for PCP prevention - Abnormal CT of the chest: Jacob Gutierrez noted to have scattered pulmonary opacities concerning for atypical infection along with other pulmonary nodules.  He had recently had COVID-19 in January of this year. -Discussed  with Jacob Gutierrez importance about compliance  Hypokalemia: Acute.  - repeleting, magnesium and phosphorus within normal limit, monitor closely for eating syndrome.  Macrocytic anemia: Acute. -  Continue to monitor, and drop this morning due to IV hydration  Severe protein calorie malnutrition: Acute on chronic.  Albumin 2.6. -Closely for refeeding syndrome, phosphorus within normal limit  Polysubstance use: Jacob Gutierrez admits to using marijuana and tobacco. -Counseled on need of cessation of tobacco and marijuana use  DVT prophylaxis: SCDs  SpO2: 99 %  Recent Labs  Lab 10/15/20 1036 10/15/20 1220 10/15/20 1438 10/16/20 0500 10/17/20 0113 10/18/20 0239  WBC 4.1  --   --  3.9* 3.5* 3.2*  PLT 218  --   --  164 157 166  AST 32  --   --   --   --   --   ALT 16  --   --   --   --   --   ALKPHOS 60  --   --   --   --   --   BILITOT 0.7  --   --   --   --   --   ALBUMIN 2.6*  --   --   --   --   --   INR  --  1.1  --   --   --   --   LATICACIDVEN  --  0.9 1.1  --   --   --        ABG     Component Value Date/Time   HCO3 22.7 10/16/2020 0600   TCO2 30 10/15/2020 1252   ACIDBASEDEF 1.5 10/16/2020 0600   O2SAT 92.6 10/16/2020 0600          Condition - Extremely  Guarded  Family Communication  :  D/W mother by phone  Code Status :  Full  Consults  :  GI,ID  Procedures  :  none  PUD Prophylaxis :   Disposition Plan  :    Status is: Inpatient  Remains inpatient appropriate because:IV treatments appropriate due to intensity of illness or inability to take PO   Dispo: The Jacob Gutierrez is from: Home              Anticipated d/c is to: Home              Jacob Gutierrez currently is not medically stable to d/c.   Difficult to place Jacob Gutierrez No      DVT Prophylaxis  :   SCDs   Lab Results  Component Value Date   PLT 166 10/18/2020    Diet :  Diet Order            DIET SOFT Room service appropriate? Yes; Fluid consistency: Thin  Diet effective now                  Inpatient Medications  Scheduled  Meds: . Darunavir-Cobicisctat-Emtricitabine-Tenofovir Alafenamide  1 tablet Oral Q breakfast  . fluconazole  400 mg Oral Daily  . lipase/protease/amylase  72,000 Units Oral TID WC  . metroNIDAZOLE  500 mg Oral Q8H  . pantoprazole (PROTONIX) IV  40 mg Intravenous Q12H  . potassium chloride  20 mEq Oral Daily  . potassium chloride  40 mEq Oral Once  . sulfamethoxazole-trimethoprim  1 tablet Oral Daily   Continuous Infusions: . cefTRIAXone (ROCEPHIN)  IV 2 g (10/17/20 1826)  . lactated ringers 75 mL/hr at 10/18/20 0925   PRN Meds:.acetaminophen, albuterol, lipase/protease/amylase, ondansetron (ZOFRAN) IV  Antibiotics  :    Anti-infectives (From admission, onward)   Start     Dose/Rate Route Frequency Ordered Stop   10/17/20 1000  fluconazole (DIFLUCAN) tablet 400 mg        400 mg Oral Daily 10/16/20 1709     10/16/20 1800  fluconazole (DIFLUCAN) IVPB 400 mg        400 mg 100 mL/hr over 120 Minutes Intravenous  Once 10/16/20 1709 10/16/20 2043   10/16/20 1215  sulfamethoxazole-trimethoprim (BACTRIM) 400-80 MG per tablet 1 tablet        1 tablet Oral Daily 10/16/20 1115     10/16/20 0800  Darunavir-Cobicisctat-Emtricitabine-Tenofovir  Alafenamide (SYMTUZA) 800-150-200-10 MG TABS 1 tablet        1 tablet Oral Daily with breakfast 10/15/20 1822     10/15/20 1615  cefTRIAXone (ROCEPHIN) 2 g in sodium chloride 0.9 % 100 mL IVPB        2 g 200 mL/hr over 30 Minutes Intravenous Every 24 hours 10/15/20 1608     10/15/20 1615  metroNIDAZOLE (FLAGYL) tablet 500 mg        500 mg Oral Every 8 hours 10/15/20 1608     10/15/20 1330  cefTRIAXone (ROCEPHIN) 2 g in sodium chloride 0.9 % 100 mL IVPB  Status:  Discontinued        2 g 200 mL/hr over 30 Minutes Intravenous  Once 10/15/20 1320 10/15/20 1603        Dawood Elgergawy M.D on 10/18/2020 at 2:06 PM  To page go to [email or web address]   [facility] -  Office  [phone]     Objective:   Vitals:   10/17/20 0726 10/17/20 1950 10/17/20 1950 10/18/20 0442  BP: 105/66 109/74 109/74 (!) 94/58  Pulse: 71 73 73 68  Resp: (!) 22 14 18 18   Temp: 98.3 F (36.8 C) 98 F (36.7 C) 98 F (36.7 C) 97.8 F (36.6 C)  TempSrc: Oral Oral Oral Oral  SpO2:  100% 100% 99%  Weight:      Height:        Wt Readings from Last 3 Encounters:  10/16/20 66.9 kg  10/15/20 64.9 kg  10/08/20 68.2 kg     Intake/Output Summary (Last 24 hours) at 10/18/2020 1406 Last data filed at 10/18/2020 0830 Gross per 24 hour  Intake 240 ml  Output -  Net 240 ml     Physical Exam  Awake Alert, Oriented X 3 frail, cachectic, in no apparent distress  symmetrical Chest wall movement, Good air movement bilaterally, CTAB RRR,No Gallops,Rubs or new Murmurs, No Parasternal Heave +ve B.Sounds, Abd Soft, No tenderness, No rebound - guarding or rigidity. No Cyanosis, Clubbing or edema, No new Rash or bruise        Data Review:    CBC Recent Labs  Lab 10/15/20 1036 10/15/20 1252 10/16/20 0500 10/17/20 0113 10/18/20 0239  WBC 4.1  --  3.9* 3.5* 3.2*  HGB 12.2* 11.9* 9.8* 9.2* 9.7*  HCT 39.0 35.0* 29.5* 27.6* 30.1*  PLT 218  --  164 157 166  MCV 101.0*  --  93.1 92.9 92.9  MCH 31.6  --   30.9 31.0 29.9  MCHC 31.3  --  33.2 33.3 32.2  RDW 16.9*  --  16.4* 17.0* 16.8*    Recent Labs  Lab 10/15/20 1036 10/15/20 1220 10/15/20 1252 10/15/20 1438 10/16/20 0500 10/16/20 1016 10/16/20 1830 10/17/20 0113 10/18/20 0239  NA 137  --  138  --  135  --  135 134* 133*  K 2.8*  --  2.9*  --  2.4* 3.1* 2.9* 3.1* 3.4*  CL 102  --   --   --  103  --  106 107 103  CO2 25  --   --   --  23  --  22 20* 24  GLUCOSE 91  --   --   --  86  --  151* 114* 149*  BUN 12  --   --   --  8  --  8 8 12   CREATININE 1.01  --   --   --  1.03  --  1.03 1.10 1.23  CALCIUM 8.5*  --   --   --  7.9*  --  7.7* 7.7* 7.9*  AST 32  --   --   --   --   --   --   --   --   ALT 16  --   --   --   --   --   --   --   --   ALKPHOS 60  --   --   --   --   --   --   --   --   BILITOT 0.7  --   --   --   --   --   --   --   --   ALBUMIN 2.6*  --   --   --   --   --   --   --   --   MG  --   --   --   --  2.1  --   --  1.9 2.1  LATICACIDVEN  --  0.9  --  1.1  --   --   --   --   --   INR  --  1.1  --   --   --   --   --   --   --     ------------------------------------------------------------------------------------------------------------------ No results for input(s): CHOL, HDL, LDLCALC, TRIG, CHOLHDL, LDLDIRECT in the last 72 hours.  No results found for: HGBA1C ------------------------------------------------------------------------------------------------------------------ No results for input(s): TSH, T4TOTAL, T3FREE, THYROIDAB in the last 72 hours.  Invalid input(s): FREET3  Cardiac Enzymes No results for input(s): CKMB, TROPONINI, MYOGLOBIN in the last 168 hours.  Invalid input(s): CK ------------------------------------------------------------------------------------------------------------------ No results found for: BNP  Micro Results Recent Results (from the past 240 hour(s))  Culture, blood (routine x 2)     Status: None (Preliminary result)   Collection Time: 10/15/20 12:20 PM    Specimen: BLOOD LEFT FOREARM  Result Value Ref Range Status   Specimen Description BLOOD LEFT FOREARM  Final   Special Requests   Final    BOTTLES DRAWN AEROBIC AND ANAEROBIC Blood Culture adequate volume   Culture   Final    NO GROWTH 2 DAYS Performed at [facility], [address] [place], [place] [address]    Report Status PENDING  Incomplete  Gastrointestinal Panel by PCR , Stool     Status: Abnormal   Collection Time: 10/15/20  2:04 PM   Specimen: Stool  Result Value Ref Range Status   Campylobacter species NOT DETECTED NOT DETECTED Final   Plesimonas shigelloides NOT DETECTED NOT DETECTED Final   Salmonella species NOT DETECTED NOT DETECTED Final   Yersinia enterocolitica NOT DETECTED NOT DETECTED Final   Vibrio species NOT DETECTED NOT DETECTED Final   Vibrio cholerae NOT DETECTED NOT DETECTED Final   Enteroaggregative E coli (EAEC) NOT DETECTED NOT DETECTED Final   Enteropathogenic E coli (EPEC) NOT DETECTED NOT DETECTED Final   Enterotoxigenic E coli (ETEC) NOT DETECTED NOT DETECTED Final   Shiga like toxin producing E coli (STEC) NOT DETECTED NOT DETECTED Final   Shigella/Enteroinvasive E coli (EIEC) DETECTED (A) NOT DETECTED Final    Comment: RESULT CALLED TO, READ BACK BY AND VERIFIED WITH: [facility] TETTEH RN 0034 10/16/20 HNM    Cryptosporidium NOT DETECTED NOT DETECTED Final   Cyclospora cayetanensis NOT DETECTED NOT DETECTED Final   Entamoeba histolytica NOT DETECTED NOT DETECTED Final   Giardia lamblia NOT DETECTED NOT DETECTED Final   Adenovirus F40/41 NOT DETECTED NOT DETECTED Final   Astrovirus NOT DETECTED NOT DETECTED Final   Norovirus GI/GII NOT DETECTED NOT DETECTED Final   Rotavirus A NOT DETECTED NOT DETECTED Final   Sapovirus (I, II, IV, and V) NOT DETECTED NOT DETECTED Final    Comment: Performed at [facility], [address] [place], [place] [address]  C Difficile Quick Screen w PCR reflex     Status: None   Collection Time: 10/15/20   2:04 PM   Specimen: Stool  Result Value Ref Range Status   C Diff antigen NEGATIVE NEGATIVE Final   C Diff toxin NEGATIVE NEGATIVE Final   C Diff interpretation No C. difficile detected.  Final    Comment: Performed at [facility], [address], [place], [place] [address]  Culture, blood (routine x 2)     Status: None (Preliminary result)   Collection Time: 10/15/20  2:30 PM   Specimen: BLOOD RIGHT ARM  Result Value Ref Range Status   Specimen Description BLOOD RIGHT ARM  Final   Special Requests   Final    BOTTLES DRAWN AEROBIC AND ANAEROBIC Blood Culture results may not be optimal due to an inadequate volume of blood received in culture bottles   Culture   Final    NO GROWTH 2 DAYS Performed at [facility], [address], [place], [place] [address]    Report Status PENDING  Incomplete  MRSA PCR Screening     Status: None   Collection Time: 10/15/20  7:10 PM   Specimen: Nasal Mucosa; Nasopharyngeal  Result Value Ref Range Status   MRSA by PCR NEGATIVE NEGATIVE Final    Comment:        The GeneXpert MRSA Assay (FDA approved for NASAL specimens only), is one component of a comprehensive MRSA colonization surveillance program. It is not intended to diagnose MRSA infection nor to guide or monitor treatment for MRSA infections. Performed at [facility], [address], [place], [place] [address]     Radiology Reports CT Chest W Contrast  Result Date: 10/15/2020 CLINICAL DATA:  Nausea and vomiting. HIV. Persistent vomiting and diarrhea with abdominal pain for 1 month. Cough. Weakness. EXAM:  CT CHEST, ABDOMEN, AND PELVIS WITH CONTRAST TECHNIQUE: Multidetector CT imaging of the chest, abdomen and pelvis was performed following the standard protocol during bolus administration of intravenous contrast. CONTRAST:  OMNIPAQUE IOHEXOL 300 MG/ML  SOLN COMPARISON:  Chest radiograph 08/07/2020. Abdominopelvic CT 02/20/2020. FINDINGS: CT CHEST FINDINGS  Cardiovascular: Normal aortic caliber. Normal heart size, without pericardial effusion. No central pulmonary embolism, on this non-dedicated study. Mediastinum/Nodes: No mediastinal or hilar adenopathy. Subtle esophageal fluid level on 37/3. Mild esophageal wall thickening, especially superiorly on 16/3. Lungs/Pleura: No pleural fluid. Mild centrilobular and paraseptal emphysema. Scattered bilateral pulmonary nodules. Many of these, especially in the right upper lobe, are somewhat ill-defined and favored to be infectious/inflammatory. Examples in the right upper lobe on 77 and 84 of series 4. Clustered nodules are also identified within the posterior left upper lobe on 62/4 and 70/4. Other more well-circumscribed nodules are indeterminate. Example in the right lower lobe at 4 mm on 130/4 and the right upper lobe at 4 mm on 66/4. Musculoskeletal: No acute osseous abnormality. CT ABDOMEN PELVIS FINDINGS Hepatobiliary: Normal liver. Normal gallbladder, without biliary ductal dilatation. Pancreas: Normal, without mass or ductal dilatation. Spleen: Normal in size, without focal abnormality. Adrenals/Urinary Tract: Normal adrenal glands. Subtle new uroepithelial mucosal thickening and mild hyperenhancement including within the renal pelvis on 84/3. No hydroureter or bladder abnormality. No hydronephrosis or renal mass. Stomach/Bowel: Normal stomach, without wall thickening. Mild wall thickening and mucosal hyperenhancement within the left side of the colon, rectum, sigmoid. The transverse and ascending colon are underdistended, but appear moderately thick walled with mucosal hyperenhancement. The terminal ileum is also underdistended but likely thickened including on 104/3. Normal small bowel. Vascular/Lymphatic: Normal aortic caliber. Prominent abdominal retroperitoneal nodes are similar to 02/13/2019 and likely reactive in the setting of HIV. Reproductive: Normal prostate. Other: No significant free fluid.  No free  intraperitoneal air. Musculoskeletal: No acute osseous abnormality. IMPRESSION: 1. Diffuse colonic wall thickening with mild mucosal hyperenhancement. This could represent infection (exclude C difficile) or inflammation. Given suspicion of concurrent terminal ileal wall thickening, Crohn disease or ulcerative colitis should be considered. 2. Scattered pulmonary opacities, primarily favored to represent mild atypical infection of indeterminate acuity. Other pulmonary nodules are technically indeterminate. No follow-up needed if Jacob Gutierrez is low-risk. Non-contrast chest CT can be considered in 12 months if Jacob Gutierrez is high-risk. This recommendation follows the consensus statement: Guidelines for Management of Incidental Pulmonary Nodules Detected on CT Images: From the Fleischner Society 2017; Radiology 2017; 284:228-243. 3. Esophageal wall thickening is mild, suggesting esophagitis. Esophageal air fluid level suggests dysmotility or gastroesophageal reflux. 4. Emphysema 5. Subtle urothelial mucosal hyperenhancement and wall thickening,primarily in the right renal pelvis. Correlate with urinalysis to exclude ascending infection. Electronically Signed   By: Jeronimo Greaves M.D.   On: 10/15/2020 14:36   CT Abdomen Pelvis W Contrast  Result Date: 10/15/2020 CLINICAL DATA:  Nausea and vomiting. HIV. Persistent vomiting and diarrhea with abdominal pain for 1 month. Cough. Weakness. EXAM: CT CHEST, ABDOMEN, AND PELVIS WITH CONTRAST TECHNIQUE: Multidetector CT imaging of the chest, abdomen and pelvis was performed following the standard protocol during bolus administration of intravenous contrast. CONTRAST:  OMNIPAQUE IOHEXOL 300 MG/ML  SOLN COMPARISON:  Chest radiograph 08/07/2020. Abdominopelvic CT 02/20/2020. FINDINGS: CT CHEST FINDINGS Cardiovascular: Normal aortic caliber. Normal heart size, without pericardial effusion. No central pulmonary embolism, on this non-dedicated study. Mediastinum/Nodes: No mediastinal or  hilar adenopathy. Subtle esophageal fluid level on 37/3. Mild esophageal wall thickening, especially superiorly on 16/3. Lungs/Pleura:  No pleural fluid. Mild centrilobular and paraseptal emphysema. Scattered bilateral pulmonary nodules. Many of these, especially in the right upper lobe, are somewhat ill-defined and favored to be infectious/inflammatory. Examples in the right upper lobe on 77 and 84 of series 4. Clustered nodules are also identified within the posterior left upper lobe on 62/4 and 70/4. Other more well-circumscribed nodules are indeterminate. Example in the right lower lobe at 4 mm on 130/4 and the right upper lobe at 4 mm on 66/4. Musculoskeletal: No acute osseous abnormality. CT ABDOMEN PELVIS FINDINGS Hepatobiliary: Normal liver. Normal gallbladder, without biliary ductal dilatation. Pancreas: Normal, without mass or ductal dilatation. Spleen: Normal in size, without focal abnormality. Adrenals/Urinary Tract: Normal adrenal glands. Subtle new uroepithelial mucosal thickening and mild hyperenhancement including within the renal pelvis on 84/3. No hydroureter or bladder abnormality. No hydronephrosis or renal mass. Stomach/Bowel: Normal stomach, without wall thickening. Mild wall thickening and mucosal hyperenhancement within the left side of the colon, rectum, sigmoid. The transverse and ascending colon are underdistended, but appear moderately thick walled with mucosal hyperenhancement. The terminal ileum is also underdistended but likely thickened including on 104/3. Normal small bowel. Vascular/Lymphatic: Normal aortic caliber. Prominent abdominal retroperitoneal nodes are similar to 02/13/2019 and likely reactive in the setting of HIV. Reproductive: Normal prostate. Other: No significant free fluid.  No free intraperitoneal air. Musculoskeletal: No acute osseous abnormality. IMPRESSION: 1. Diffuse colonic wall thickening with mild mucosal hyperenhancement. This could represent infection  (exclude C difficile) or inflammation. Given suspicion of concurrent terminal ileal wall thickening, Crohn disease or ulcerative colitis should be considered. 2. Scattered pulmonary opacities, primarily favored to represent mild atypical infection of indeterminate acuity. Other pulmonary nodules are technically indeterminate. No follow-up needed if Jacob Gutierrez is low-risk. Non-contrast chest CT can be considered in 12 months if Jacob Gutierrez is high-risk. This recommendation follows the consensus statement: Guidelines for Management of Incidental Pulmonary Nodules Detected on CT Images: From the Fleischner Society 2017; Radiology 2017; 284:228-243. 3. Esophageal wall thickening is mild, suggesting esophagitis. Esophageal air fluid level suggests dysmotility or gastroesophageal reflux. 4. Emphysema 5. Subtle urothelial mucosal hyperenhancement and wall thickening,primarily in the right renal pelvis. Correlate with urinalysis to exclude ascending infection. Electronically Signed   By: Jeronimo GreavesKyle  Talbot M.D.   On: 10/15/2020 14:36

## 2020-10-18 NOTE — Plan of Care (Signed)
Patient is currently resting in bed. No complaints overnight. OOB ambulating in room. VSS. Call bell within reach.   Problem: Education: Goal: Knowledge of General Education information will improve Description: Including pain rating scale, medication(s)/side effects and non-pharmacologic comfort measures Outcome: Progressing   Problem: Health Behavior/Discharge Planning: Goal: Ability to manage health-related needs will improve Outcome: Progressing   Problem: Clinical Measurements: Goal: Ability to maintain clinical measurements within normal limits will improve Outcome: Progressing Goal: Will remain free from infection Outcome: Progressing Goal: Diagnostic test results will improve Outcome: Progressing Goal: Respiratory complications will improve Outcome: Progressing Goal: Cardiovascular complication will be avoided Outcome: Progressing   Problem: Activity: Goal: Risk for activity intolerance will decrease Outcome: Progressing   Problem: Nutrition: Goal: Adequate nutrition will be maintained Outcome: Progressing   Problem: Coping: Goal: Level of anxiety will decrease Outcome: Progressing   Problem: Elimination: Goal: Will not experience complications related to bowel motility Outcome: Progressing Goal: Will not experience complications related to urinary retention Outcome: Progressing   Problem: Pain Managment: Goal: General experience of comfort will improve Outcome: Progressing   Problem: Safety: Goal: Ability to remain free from injury will improve Outcome: Progressing   Problem: Skin Integrity: Goal: Risk for impaired skin integrity will decrease Outcome: Progressing

## 2020-10-19 ENCOUNTER — Telehealth: Payer: Self-pay | Admitting: *Deleted

## 2020-10-19 ENCOUNTER — Ambulatory Visit: Payer: Medicaid Other | Admitting: Nurse Practitioner

## 2020-10-19 DIAGNOSIS — R112 Nausea with vomiting, unspecified: Secondary | ICD-10-CM | POA: Diagnosis not present

## 2020-10-19 DIAGNOSIS — R197 Diarrhea, unspecified: Secondary | ICD-10-CM | POA: Diagnosis not present

## 2020-10-19 DIAGNOSIS — A042 Enteroinvasive Escherichia coli infection: Principal | ICD-10-CM

## 2020-10-19 DIAGNOSIS — N2889 Other specified disorders of kidney and ureter: Secondary | ICD-10-CM

## 2020-10-19 LAB — CBC
HCT: 29.7 % — ABNORMAL LOW (ref 39.0–52.0)
Hemoglobin: 9.9 g/dL — ABNORMAL LOW (ref 13.0–17.0)
MCH: 31.3 pg (ref 26.0–34.0)
MCHC: 33.3 g/dL (ref 30.0–36.0)
MCV: 94 fL (ref 80.0–100.0)
Platelets: 198 10*3/uL (ref 150–400)
RBC: 3.16 MIL/uL — ABNORMAL LOW (ref 4.22–5.81)
RDW: 17.1 % — ABNORMAL HIGH (ref 11.5–15.5)
WBC: 3.5 10*3/uL — ABNORMAL LOW (ref 4.0–10.5)
nRBC: 0 % (ref 0.0–0.2)

## 2020-10-19 LAB — CMV DNA, QUANTITATIVE, PCR
CMV DNA Quant: POSITIVE IU/mL
Log10 CMV Qn DNA Pl: UNDETERMINED log10 IU/mL

## 2020-10-19 LAB — BASIC METABOLIC PANEL
Anion gap: 5 (ref 5–15)
BUN: 16 mg/dL (ref 6–20)
CO2: 21 mmol/L — ABNORMAL LOW (ref 22–32)
Calcium: 7.8 mg/dL — ABNORMAL LOW (ref 8.9–10.3)
Chloride: 107 mmol/L (ref 98–111)
Creatinine, Ser: 1.25 mg/dL — ABNORMAL HIGH (ref 0.61–1.24)
GFR, Estimated: >60 mL/min (ref >60)
Glucose, Bld: 128 mg/dL — ABNORMAL HIGH (ref 70–99)
Potassium: 3.4 mmol/L — ABNORMAL LOW (ref 3.5–5.1)
Sodium: 133 mmol/L — ABNORMAL LOW (ref 135–145)

## 2020-10-19 LAB — MAGNESIUM: Magnesium: 2 mg/dL (ref 1.7–2.4)

## 2020-10-19 LAB — PHOSPHORUS: Phosphorus: 2.5 mg/dL (ref 2.5–4.6)

## 2020-10-19 MED ORDER — METOCLOPRAMIDE HCL 5 MG/ML IJ SOLN: 10.0000 mg | Freq: Once | INTRAMUSCULAR | Status: DC

## 2020-10-19 MED ORDER — PEG-KCL-NACL-NASULF-NA ASC-C 100 G PO SOLR: 1.0000 | Freq: Once | ORAL | Status: DC

## 2020-10-19 MED ORDER — PEG-KCL-NACL-NASULF-NA ASC-C 100 G PO SOLR
0.5000 | Freq: Once | ORAL | Status: DC
  Filled 2020-10-19: qty 1

## 2020-10-19 MED ORDER — ENOXAPARIN SODIUM 40 MG/0.4ML ~~LOC~~ SOLN
40.0000 mg | Freq: Once | SUBCUTANEOUS | Status: AC
  Administered 2020-10-19: 40 mg via SUBCUTANEOUS
  Filled 2020-10-19: qty 0.4

## 2020-10-19 MED ORDER — PANTOPRAZOLE SODIUM 40 MG PO TBEC
40.0000 mg | DELAYED_RELEASE_TABLET | Freq: Two times a day (BID) | ORAL | Status: DC
  Administered 2020-10-19 – 2020-10-23 (×8): 40 mg via ORAL
  Filled 2020-10-19 (×8): qty 1

## 2020-10-19 NOTE — Telephone Encounter (Signed)
Medical Assistant left message on patient's home and cell voicemail. Voicemail states to give a call back to Cabela Pacifico with [facility] at [phone].  

## 2020-10-19 NOTE — Telephone Encounter (Signed)
-----   Message from Anders Simmonds, [place] sent at 10/14/2020  9:40 AM EDT ----- Your potassium and hemoglobin are low.  I have sent you tablets for both to the pharmacy.  Make sure and drink 80-100 ounces water daily and we will recheck these at your next visit. Your kidney function and liver function are normal.  Follow up as planned.  Thanks, Georgian Co, PA-C

## 2020-10-19 NOTE — Plan of Care (Signed)
Patient is currently resting in bed. No complaints overnight. MIVF. VSS. Call bell within reach.   Problem: Education: Goal: Knowledge of General Education information will improve Description: Including pain rating scale, medication(s)/side effects and non-pharmacologic comfort measures Outcome: Progressing   Problem: Elimination: Goal: Will not experience complications related to bowel motility Outcome: Progressing Goal: Will not experience complications related to urinary retention Outcome: Progressing   Problem: Pain Managment: Goal: General experience of comfort will improve Outcome: Progressing   Problem: Safety: Goal: Ability to remain free from injury will improve Outcome: Progressing   Problem: Skin Integrity: Goal: Risk for impaired skin integrity will decrease Outcome: Progressing   Problem: Clinical Measurements: Goal: Ability to maintain clinical measurements within normal limits will improve Outcome: Progressing Goal: Will remain free from infection Outcome: Progressing Goal: Diagnostic test results will improve Outcome: Progressing Goal: Respiratory complications will improve Outcome: Progressing Goal: Cardiovascular complication will be avoided Outcome: Progressing

## 2020-10-19 NOTE — Progress Notes (Signed)
CSW received a consult regarding patient homelessness. CSW completed [facility] [employer] referral for housing support. CSW will provide shelter list for patient.   Viviene Thurston LCSW

## 2020-10-19 NOTE — Progress Notes (Addendum)
[facility] Infectious Diseases Follow Up Note  Patient Identification: Patient Name: Jacob Gutierrez MRN: [id number] [place]un[place] Date: 10/15/2020 10:24 AM Age: 35 y.o.Today's Date: 10/19/2020   Reason for Visit: hiv/aids, pulmonary nodule, diarrhea   Principal Problem:   Nausea, vomiting, and diarrhea Active Problems:   AIDS (acquired immune deficiency syndrome) (HCC)   Protein-calorie malnutrition, severe   Acute pyelonephritis   Hypokalemia   Colitis   Dysphagia   Antibiotics: Ceftriaxone 3/24-                    Fluconazole 3/26-                    Metronidaozole 3/24-c  Lines/Tubes: PIVs   Interval Events: remains afebrile, hemodynamically stable and on room air. Planned for colonoscopy on 3/30    Assessment HIV/AIDs with h/o non compliance  Shigella/Entero invasive E coli Colitis. CMV DNA not detected. Clinically improving  Pulmonary nodules of unknown etiology - Cryptococcal antigen is negative. Appears to be comfortable in RA Possible Candidal Esophagitis - On fluconazole, dysphagia is improving. No thrush noted. Fu path from EGD  Subtle urothelial mucosal hyperenhancement and wall thickening,primarily in the right renal pelvis in CT Chest Bicytopenia- likely related to AIDs  Comments - he has pulmonary nodules in CT chest which seems to be concentrated in the bilateral upper lobes. He does have risk factors for TB like - past h/o being homeless, incarcerated 10 years ago, past h/o IVDU. Denies having h/o cough r SOB prior to admit. But has h/o weight loss. Given his advanced immunosuppression, he is high risk for TB, MAC, nocardia, fungal infection vs others. CMV IgG >10 with CMV DNA <200 is non specific for active CMV infection   Recommendations Continue Symtuza and Bactrim ppx Continue ceftriaxone and metronidazole as is. Anticipate 10 days duration pending if continues to clinically improve Fu HIV VL and genotype   Sputum AFB smear and cultures *3 . Quantiferon   Fu AFB blood cultures  Fu path from EGD Fu Histoplasma antigen, blastomyces ag, fungitell Will make a follow up with ID upon discharge   Rest of the management as per the primary team. Thank you for the consult. Please page with pertinent questions or concerns.  ______________________________________________________________________ Subjective patient seen and examined at the bedside. Eating breakfast, says dysphagia is getting better with his current tx. Diarrhea is also improving from 10 episodes in a day to 2-3 episodes a day.  Denies N/V and diarrhea  Vitals BP 110/68 (BP Location: Left Arm)   Pulse 63   Temp 97.7 F (36.5 C) (Oral)   Resp 12   Ht _0  (1.854 m)   Wt 66.9 kg   SpO2 100%   BMI 19.46 kg/m     Physical Exam Constitutional:  thin looking, cachectic, not in acute distress    Comments:   Cardiovascular:     Rate and Rhythm: Normal rate and regular rhythm.     Heart sounds: No murmur heard.   Pulmonary:     Effort: Pulmonary effort is normal.     Comments:   Abdominal:     Palpations: Abdomen is soft.     Tenderness: Non tender, BS normal   Musculoskeletal:        General: No swelling or tenderness.   Skin:    Comments: no obvious rashes   Neurological:     General: No focal deficit present.   Psychiatric:  Mood and Affect: Mood normal.    Pertinent Microbiology Results for orders placed or performed during the hospital encounter of 10/15/20  Culture, blood (routine x 2)     Status: None (Preliminary result)   Collection Time: 10/15/20 12:20 PM   Specimen: BLOOD LEFT FOREARM  Result Value Ref Range Status   Specimen Description BLOOD LEFT FOREARM  Final   Special Requests   Final    BOTTLES DRAWN AEROBIC AND ANAEROBIC Blood Culture adequate volume   Culture   Final    NO GROWTH 4 DAYS Performed at [facility], [place] [address] [place], [place] [address]    Report Status  PENDING  Incomplete  Gastrointestinal Panel by PCR , Stool     Status: Abnormal   Collection Time: 10/15/20  2:04 PM   Specimen: Stool  Result Value Ref Range Status   Campylobacter species NOT DETECTED NOT DETECTED Final   Plesimonas shigelloides NOT DETECTED NOT DETECTED Final   Salmonella species NOT DETECTED NOT DETECTED Final   Yersinia enterocolitica NOT DETECTED NOT DETECTED Final   Vibrio species NOT DETECTED NOT DETECTED Final   Vibrio cholerae NOT DETECTED NOT DETECTED Final   Enteroaggregative E coli (EAEC) NOT DETECTED NOT DETECTED Final   Enteropathogenic E coli (EPEC) NOT DETECTED NOT DETECTED Final   Enterotoxigenic E coli (ETEC) NOT DETECTED NOT DETECTED Final   Shiga like toxin producing E coli (STEC) NOT DETECTED NOT DETECTED Final   Shigella/Enteroinvasive E coli (EIEC) DETECTED (A) NOT DETECTED Final    Comment: RESULT CALLED TO, READ BACK BY AND VERIFIED WITH: [facility] TETTEH RN [id number] 10/16/20 HNM    Cryptosporidium NOT DETECTED NOT DETECTED Final   Cyclospora cayetanensis NOT DETECTED NOT DETECTED Final   Entamoeba histolytica NOT DETECTED NOT DETECTED Final   Giardia lamblia NOT DETECTED NOT DETECTED Final   Adenovirus F40/41 NOT DETECTED NOT DETECTED Final   Astrovirus NOT DETECTED NOT DETECTED Final   Norovirus GI/GII NOT DETECTED NOT DETECTED Final   Rotavirus A NOT DETECTED NOT DETECTED Final   Sapovirus (I, II, IV, and V) NOT DETECTED NOT DETECTED Final    Comment: Performed at [facility], [address] [place], [place] [address]  C Difficile Quick Screen w PCR reflex     Status: None   Collection Time: 10/15/20  2:04 PM   Specimen: Stool  Result Value Ref Range Status   C Diff antigen NEGATIVE NEGATIVE Final   C Diff toxin NEGATIVE NEGATIVE Final   C Diff interpretation No C. difficile detected.  Final    Comment: Performed at [facility], [place] [address], [place], [place] [address]  Culture, blood (routine x 2)     Status: None  (Preliminary result)   Collection Time: 10/15/20  2:30 PM   Specimen: BLOOD RIGHT ARM  Result Value Ref Range Status   Specimen Description BLOOD RIGHT ARM  Final   Special Requests   Final    BOTTLES DRAWN AEROBIC AND ANAEROBIC Blood Culture results may not be optimal due to an inadequate volume of blood received in culture bottles   Culture   Final    NO GROWTH 4 DAYS Performed at [facility], [place] [address] [place], [place] [address]    Report Status PENDING  Incomplete  MRSA PCR Screening     Status: None   Collection Time: 10/15/20  7:10 PM   Specimen: Nasal Mucosa; Nasopharyngeal  Result Value Ref Range Status   MRSA  by PCR NEGATIVE NEGATIVE Final    Comment:        The GeneXpert MRSA Assay (FDA approved for NASAL specimens only), is one component of a comprehensive MRSA colonization surveillance program. It is not intended to diagnose MRSA infection nor to guide or monitor treatment for MRSA infections. Performed at [facility], [place] [address], [place], [place] [address]      Pertinent Lab. CBC Latest Ref Rng & Units 10/19/2020 10/18/2020 10/17/2020  WBC 4.0 - 10.5 K/uL 3.5(L) 3.2(L) 3.5(L)  Hemoglobin 13.0 - 17.0 g/dL 9.9(L) 9.7(L) 9.2(L)  Hematocrit 39.0 - 52.0 % 29.7(L) 30.1(L) 27.6(L)  Platelets 150 - 400 K/uL 198 166 157   CMP Latest Ref Rng & Units 10/19/2020 10/18/2020 10/17/2020  Glucose 70 - 99 mg/dL 128(H) 149(H) 114(H)  BUN 6 - 20 mg/dL _0 Creatinine 0.61 - 1.24 mg/dL 1.25(H) 1.23 1.10  Sodium 135 - 145 mmol/L 133(L) 133(L) 134(L)  Potassium 3.5 - 5.1 mmol/L 3.4(L) 3.4(L) 3.1(L)  Chloride 98 - 111 mmol/L 107 103 107  CO2 22 - 32 mmol/L 21(L) 24 20(L)  Calcium 8.9 - 10.3 mg/dL 7.8(L) 7.9(L) 7.7(L)  Total Protein 6.5 - 8.1 g/dL - - -  Total Bilirubin 0.3 - 1.2 mg/dL - - -  Alkaline Phos 38 - 126 U/L - - -  AST 15 - 41 U/L - - -  ALT 0 - 44 U/L - - -     Pertinent Imaging today Plain films and CT images have been personally  visualized and interpreted; radiology reports have been reviewed. Decision making incorporated into the Impression / Recommendations.  I have spent approx 30 minutes for this patient encounter including review of prior medical records, coordination of care  with greater than 50% of time being face to face/counseling and discussing diagnostics/treatment plan with the patient/family.  Electronically signed by:   Rosiland Oz, MD Infectious Disease Physician [facility] Pager: [phone]

## 2020-10-19 NOTE — Progress Notes (Deleted)
     10/19/2020 Jacob Gutierrez [id number] May 03, 1986   Chief Complaint:  History of Present Illness: Jacob Gutierrez is a 35 year old male with a past medical history AIDS, Shigella gastroenteritis, syphilis, coccidiomycosis, anal dysplasia.  Vaccinated for COVID-19 but tested positive for Covid 08/07/2020.  He was admitted to the hospital 10/16/2020 with vomiting and diarrhea.  CTAP showed evidence of colitis.  Stool studies 09/2019 + for Shigella/enteroinvasive E. coli, norovirus. Dysphagia an odynophagia.   CT chest/abdomen/pelvis w contrast: Diffuse colonic wall thickening and mucosal enhancement, suspicion of concurrent TI wall thickening.  ?inflammation/IBD versus infection?  Scattered lung opacities.  Mild esophageal wall thickening s/o esophagitis.  Esophageal AF levels suggest dysmotility or reflux.  Emphysema.  Urothelial mucosal heart hyperenhancement and wall thickening   He underwent an EGD 10/16/2020 which showed candidiasis esophagitis, a 4 cm hiatal hernia and gastritis were identified.  Esophageal and gastric biopsies are pending.      EGD 10/16/2020: - LA Grade C esophagitis with bleeding. - Esophageal plaques were found, consistent with candidiasis. - Biopsies were taken with a cold forceps for histology in the entire esophagus. - Z-line regular, 40 cm from the incisors. - 4 cm hiatal hernia. - Gastritis. Biopsied. - No gross lesions in the duodenal bulb, in the first portion of the duodenum and in the second portion of the duodenum. Biopsied.     Current Medications, Allergies, Past Medical History, Past Surgical History, Family History and Social History were reviewed in [employer] record.   Review of Systems:   Constitutional: Negative for fever, sweats, chills or weight loss.  Respiratory: Negative for shortness of breath.   Cardiovascular: Negative for chest pain, palpitations and leg swelling.  Gastrointestinal: See HPI.   Musculoskeletal: Negative for back pain or muscle aches.  Neurological: Negative for dizziness, headaches or paresthesias.    Physical Exam: There were no vitals taken for this visit. General: Well developed, w   ***male in no acute distress. Head: Normocephalic and atraumatic. Eyes: No scleral icterus. Conjunctiva pink . Ears: Normal auditory acuity. Mouth: Dentition intact. No ulcers or lesions.  Lungs: Clear throughout to auscultation. Heart: Regular rate and rhythm, no murmur. Abdomen: Soft, nontender and nondistended. No masses or hepatomegaly. Normal bowel sounds x 4 quadrants.  Rectal: *** Musculoskeletal: Symmetrical with no gross deformities. Extremities: No edema. Neurological: Alert oriented x 4. No focal deficits.  Psychological: Alert and cooperative. Normal mood and affect  Assessment and Recommendations  35 year old male with colitis   HIV.  Latest HIV viral count of 449,000, CD 4 count <35 on 08/25/2020.  History of poor compliance with a Atripla, developed K103N and M184V resistance. Transitioned to Stribild and Prezista per ID notes:

## 2020-10-19 NOTE — Progress Notes (Addendum)
Daily Rounding Note  10/19/2020, 11:07 AM  LOS: 4 days   SUBJECTIVE:   Chief complaint:    Loose stools.  Abdominal pain.  Dysphagia.  Moderately severe esophagitis  Stools soft, less loose and brown.  Feels a lot better.  Appetite returned. Tolerating soft diet.  Does not want to switch back to clears until tmrw, so discussed colonoscopy w pt.  Agreeable to do this on Wed.  OBJECTIVE:         Vital signs in last 24 hours:    Temp:  [97.6 F (36.4 C)-98.7 F (37.1 C)] 97.7 F (36.5 C) (03/28 0531) Pulse Rate:  [63-84] 63 (03/28 0531) Resp:  [12-16] 12 (03/28 0531) BP: (95-110)/(58-80) 110/68 (03/28 0531) SpO2:  [100 %] 100 % (03/28 0531) Last BM Date: 10/18/20 Filed Weights   10/15/20 1901 10/16/20 1342  Weight: 66.9 kg 66.9 kg   General: looks thin, generally unwell   Heart: RRR Chest: clear bil.  No labored breathing Abdomen: thin, NT.  Active BS  Extremities: no edema Neuro/Psych:  Oriented x 3.  Walks w/o problems and w/o assistance.    Intake/Output from previous day: 03/27 0701 - 03/28 0700 In: 1182.2 [P.O.:240; I.V.:942.2] Out: -   Intake/Output this shift: No intake/output data recorded.  Lab Results: Recent Labs    10/17/20 0113 10/18/20 0239 10/19/20 0116  WBC 3.5* 3.2* 3.5*  HGB 9.2* 9.7* 9.9*  HCT 27.6* 30.1* 29.7*  PLT 157 166 198   BMET Recent Labs    10/17/20 0113 10/18/20 0239 10/19/20 0116  NA 134* 133* 133*  K 3.1* 3.4* 3.4*  CL 107 103 107  CO2 20* 24 21*  GLUCOSE 114* 149* 128*  BUN 8 12 16   CREATININE 1.10 1.23 1.25*  CALCIUM 7.7* 7.9* 7.8*   LFT No results for input(s): PROT, ALBUMIN, AST, ALT, ALKPHOS, BILITOT, BILIDIR, IBILI in the last 72 hours. PT/INR No results for input(s): LABPROT, INR in the last 72 hours. Hepatitis Panel No results for input(s): HEPBSAG, HCVAB, HEPAIGM, HEPBIGM in the last 72 hours.  Studies/Results: No results found.   Scheduled  Meds: . Darunavir-Cobicisctat-Emtricitabine-Tenofovir Alafenamide  1 tablet Oral Q breakfast  . fluconazole  400 mg Oral Daily  . lipase/protease/amylase  72,000 Units Oral TID WC  . metroNIDAZOLE  500 mg Oral Q8H  . pantoprazole (PROTONIX) IV  40 mg Intravenous Q12H  . potassium chloride  20 mEq Oral Daily  . sulfamethoxazole-trimethoprim  1 tablet Oral Daily   Continuous Infusions: . cefTRIAXone (ROCEPHIN)  IV 2 g (10/18/20 1718)  . lactated ringers 75 mL/hr at 10/18/20 2213   PRN Meds:.acetaminophen, albuterol, lipase/protease/amylase, ondansetron (ZOFRAN) IV   ASSESMENT:   *    Abdominal pain, dysphagia. 10/16/2020 EGD.  Grade C esophagitis with bleeding, biopsies obtained results pending.  4 cm HH.  Nonbleeding patchy gastritis, biopsied. Patient had never initiated Rx'd omeprazole.  Now on Protonix 40 IV  Bid. Empiric fluconazole in place Awaiting path reports.    *    Diarrhea.  FOBT positive. CTAP with contrast shows diffuse colon wall thickening and mucosal enhancement with suspicion for concurrent TI wall thickening, question inflammation/IBD vs infection.  C. difficile negative.  Shigella/enteroinvasive E. coli detected,  fecal lactoferrin positive on stool studies 2/25. Repeat stool PCR studies 3/24 still positive Shigella. Cryptococcal antigen negative.  CMV Ab positive. Pancreatic elastase 72. Normal is greater than 200.  Severe pancreatic insufficiency is less than 100.  On  creon.   Rocephin, po Flagyl in place per ID.     *     HIV positive.  History of poor compliance with antiretrovirals.  Meds switched when he developed resistance to his previous regimen Bactrim for PCP prophylaxis in place.    *   Normocytic anemia. Hgb stable in 9s.  Baseline 9.5 to 11.5.     PLAN   *  Colonoscopy, 3/30.  Start clears tomorrow.    Switch to Protonix 40 po bid.      Jennye Moccasin  10/19/2020, 11:07 AM Phone [phone]    Attending physician's note   I have taken  an interval history, reviewed the chart and examined the patient. I agree with the Advanced Practitioner's note, impression and recommendations.   HIV/AIDS diarrhea with abn CT showing ileocolitis. Pt cannot come for outpt colon (d/t transportation issues, is homeless) and would like to get colon done while he is here. Stool PCR was pos for shigella. Pt is on broad spectrum A/Bs- rocephin/flagyl. Dysphagia/odynophagia d/t Candida esophagitis/HH (resolved on Diflucan/Protonix)  Plan: -Since patient cannot come back for colonoscopy, we would proceed with colonoscopy near discharge.  Tentatively 3/30.  Discussed risks and benefits. -Continue rest of the medications including Diflucan/Protonix     Edman Circle, MD [facility] [phone]

## 2020-10-19 NOTE — Progress Notes (Signed)
PT Cancellation Note  Patient Details Name: Jacob Gutierrez MRN: [id number] DOB: Nov 26, 1985   Cancelled Treatment:    Reason Eval/Treat Not Completed: PT screened, no needs identified, will sign off. Pt and nurse report pt is independent with all mobility. Pt plans to walk in hallway later.    Angelina Ok [facility] 10/19/2020, 10:47 AM Skip Mayer PT Acute Rehabilitation Services Pager [phone] Office [phone]

## 2020-10-19 NOTE — Progress Notes (Addendum)
PROGRESS NOTE                                                                             PROGRESS NOTE                                                                                                                                                                                                             Patient Demographics:    Jacob Gutierrez, is a 35 y.o. male, DOB - May 20, 1986, TOI:[id number]  Outpatient Primary MD for the patient is Patient, No Pcp Per    LOS - 4  Admit date - 10/15/2020    No chief complaint on file.      Brief Narrative    35 y.o. male with medical history significant of AIDS(last CD4 count < 35) with prior infections include Shigella gastroenteritis, syphilis, coccidiomycosis, and recent COVID-19 infection in 07/2020 presents with complaints of nausea, vomiting, and diarrhea for the last month.    Patient presents with diarrhea, dysphagia, CT abdomen pelvis significant for colitis, GI pathogen significant for Shigella, he had endoscopy 3/25 which was significant for severe esophagitis.  .    Subjective:    [name] today reports diarrhea has improved, semiformed, 4 episodes yesterday, reports appetite has improved, denies any fever or chills, no dyspnea.   Assessment  & Plan :    Principal Problem:   Nausea, vomiting, and diarrhea Active Problems:   AIDS (acquired immune deficiency syndrome) (HCC)   Protein-calorie malnutrition, severe   Acute pyelonephritis   Hypokalemia   Colitis   Dysphagia  Nausea, vomiting, and diarrhea >>Shigella/Entero invasive E coli Colitis :  -GI pathogen panel showing Shigella , E coli enteroinvasive,  -Antibiotics management per ID, continue with Rocephin and Flagyl . -CMV DNA not detected -Keep on IV fluids.  Appetite has been improving -Diarrhea is improving, appetite has improved as well, would consider stopping IV fluids in 24 hours.   Dysphagia: Acute.   -status  post  endoscopy 3/25, significant for grade C esophagitis with bleeding  found in the entire esophagus, with diffuse white plaques in the entire esophagus. -Continue with Diflucan and IV Protonix per GI recommendations. -Tolerating soft diet  Pyelonephritis: Acute - . Urinalysis was positive for signs of infection.  CT imaging significant for right renal enhancement concerning for asending infection. -Continue empiric antibiotics of Rocephin IV   AIDS -Magement per ID, resumed on  Symtuza -Restarted on Bactrim for PCP prevention -Discussed  with patient importance about compliance  Abnormal CT of the chest/pulmonary nodules of unknown etiology -  Patient noted to have scattered pulmonary opacities concerning for atypical infection along with other pulmonary nodules.  He had recently had COVID-19 in January of this year. -Follow on QuantiFERON, obtain sputum AFB smears and cultures x3  Hypokalemia: Acute.  - repeleting, magnesium and phosphorus within normal limit, monitor closely for refeeding  syndrome.  Macrocytic anemia: Acute. -  Continue to monitor, and drop this morning due to IV hydration  Severe protein calorie malnutrition: Acute on chronic.  Albumin 2.6. -Closely for refeeding syndrome, phosphorus within normal limit  Polysubstance use: Patient admits to using marijuana and tobacco. -Counseled on need of cessation of tobacco and marijuana use  DVT prophylaxis: SCDs  SpO2: 98 %  Recent Labs  Lab 10/15/20 1036 10/15/20 1220 10/15/20 1438 10/16/20 0500 10/17/20 0113 10/18/20 0239 10/19/20 0116  WBC 4.1  --   --  3.9* 3.5* 3.2* 3.5*  PLT 218  --   --  164 157 166 198  AST 32  --   --   --   --   --   --   ALT 16  --   --   --   --   --   --   ALKPHOS 60  --   --   --   --   --   --   BILITOT 0.7  --   --   --   --   --   --   ALBUMIN 2.6*  --   --   --   --   --   --   INR  --  1.1  --   --   --   --   --   LATICACIDVEN  --  0.9 1.1  --   --   --   --         ABG     Component Value Date/Time   HCO3 22.7 10/16/2020 0600   TCO2 30 10/15/2020 1252   ACIDBASEDEF 1.5 10/16/2020 0600   O2SAT 92.6 10/16/2020 0600          Condition - Extremely Guarded  Family Communication  :  D/W mother by phone 3/27  Code Status :  Full  Consults  :  GI,ID  Procedures  :  none  PUD Prophylaxis :   Disposition Plan  :    Status is: Inpatient  Remains inpatient appropriate because:IV treatments appropriate due to intensity of illness or inability to take PO   Dispo: The patient is from: Home              Anticipated d/c is to: Home              Patient currently is not medically stable to d/c.   Difficult to place patient No      DVT Prophylaxis  :   SCDs >> will start Lovenox today, but will hold starting tomorrow in anticipation of colonoscopy 3/30.  Lab Results  Component Value  Date   PLT 198 10/19/2020    Diet :  Diet Order            Diet clear liquid Room service appropriate? Yes; Fluid consistency: Thin  Diet effective 0500 tomorrow           Diet regular Room service appropriate? Yes; Fluid consistency: Thin  Diet effective now                  Inpatient Medications  Scheduled Meds: . Darunavir-Cobicisctat-Emtricitabine-Tenofovir Alafenamide  1 tablet Oral Q breakfast  . fluconazole  400 mg Oral Daily  . lipase/protease/amylase  72,000 Units Oral TID WC  . metroNIDAZOLE  500 mg Oral Q8H  . pantoprazole  40 mg Oral BID  . potassium chloride  20 mEq Oral Daily  . sulfamethoxazole-trimethoprim  1 tablet Oral Daily   Continuous Infusions: . cefTRIAXone (ROCEPHIN)  IV 2 g (10/18/20 1718)  . lactated ringers 75 mL/hr at 10/18/20 2213   PRN Meds:.acetaminophen, albuterol, lipase/protease/amylase, ondansetron (ZOFRAN) IV  Antibiotics  :    Anti-infectives (From admission, onward)   Start     Dose/Rate Route Frequency Ordered Stop   10/17/20 1000  fluconazole (DIFLUCAN) tablet 400 mg        400 mg Oral Daily  10/16/20 1709     10/16/20 1800  fluconazole (DIFLUCAN) IVPB 400 mg        400 mg 100 mL/hr over 120 Minutes Intravenous  Once 10/16/20 1709 10/16/20 2043   10/16/20 1215  sulfamethoxazole-trimethoprim (BACTRIM) 400-80 MG per tablet 1 tablet        1 tablet Oral Daily 10/16/20 1115     10/16/20 0800  Darunavir-Cobicisctat-Emtricitabine-Tenofovir Alafenamide (SYMTUZA) 800-150-200-10 MG TABS 1 tablet        1 tablet Oral Daily with breakfast 10/15/20 1822     10/15/20 1615  cefTRIAXone (ROCEPHIN) 2 g in sodium chloride 0.9 % 100 mL IVPB        2 g 200 mL/hr over 30 Minutes Intravenous Every 24 hours 10/15/20 1608     10/15/20 1615  metroNIDAZOLE (FLAGYL) tablet 500 mg        500 mg Oral Every 8 hours 10/15/20 1608     10/15/20 1330  cefTRIAXone (ROCEPHIN) 2 g in sodium chloride 0.9 % 100 mL IVPB  Status:  Discontinued        2 g 200 mL/hr over 30 Minutes Intravenous  Once 10/15/20 1320 10/15/20 1603        Anjelo Pullman M.D on 10/19/2020 at 3:15 PM  To page go to [email or web address]   [facility] -  Office  [phone]     Objective:   Vitals:   10/18/20 1500 10/18/20 2223 10/19/20 0531 10/19/20 1207  BP: 103/80 (!) 95/58 110/68 116/67  Pulse: 77 84 63 73  Resp: 12 16 12 17   Temp: 98.7 F (37.1 C) 97.6 F (36.4 C) 97.7 F (36.5 C) 98 F (36.7 C)  TempSrc: Oral Oral Oral Oral  SpO2:  100% 100% 98%  Weight:      Height:        Wt Readings from Last 3 Encounters:  10/16/20 66.9 kg  10/15/20 64.9 kg  10/08/20 68.2 kg     Intake/Output Summary (Last 24 hours) at 10/19/2020 1515 Last data filed at 10/19/2020 0300 Gross per 24 hour  Intake 942.24 ml  Output --  Net 942.24 ml     Physical Exam  Awake Alert, Oriented X 3 frail, cachectic, in no  apparent distress Symmetrical Chest wall movement, Good air movement bilaterally, CTAB RRR,No Gallops,Rubs or new Murmurs, No Parasternal Heave +ve B.Sounds, Abd Soft, No tenderness, No rebound - guarding or  rigidity. No Cyanosis, Clubbing or edema, No new Rash or bruise        Data Review:    CBC Recent Labs  Lab 10/15/20 1036 10/15/20 1252 10/16/20 0500 10/17/20 0113 10/18/20 0239 10/19/20 0116  WBC 4.1  --  3.9* 3.5* 3.2* 3.5*  HGB 12.2* 11.9* 9.8* 9.2* 9.7* 9.9*  HCT 39.0 35.0* 29.5* 27.6* 30.1* 29.7*  PLT 218  --  164 157 166 198  MCV 101.0*  --  93.1 92.9 92.9 94.0  MCH 31.6  --  30.9 31.0 29.9 31.3  MCHC 31.3  --  33.2 33.3 32.2 33.3  RDW 16.9*  --  16.4* 17.0* 16.8* 17.1*    Recent Labs  Lab 10/15/20 1036 10/15/20 1220 10/15/20 1252 10/15/20 1438 10/16/20 0500 10/16/20 1016 10/16/20 1830 10/17/20 0113 10/18/20 0239 10/19/20 0116  NA 137  --    < >  --  135  --  135 134* 133* 133*  K 2.8*  --    < >  --  2.4* 3.1* 2.9* 3.1* 3.4* 3.4*  CL 102  --   --   --  103  --  106 107 103 107  CO2 25  --   --   --  23  --  22 20* 24 21*  GLUCOSE 91  --   --   --  86  --  151* 114* 149* 128*  BUN 12  --   --   --  8  --  CREATININE 1.01  --   --   --  1.03  --  1.03 1.10 1.23 1.25*  CALCIUM 8.5*  --   --   --  7.9*  --  7.7* 7.7* 7.9* 7.8*  AST 32  --   --   --   --   --   --   --   --   --   ALT 16  --   --   --   --   --   --   --   --   --   ALKPHOS 60  --   --   --   --   --   --   --   --   --   BILITOT 0.7  --   --   --   --   --   --   --   --   --   ALBUMIN 2.6*  --   --   --   --   --   --   --   --   --   MG  --   --   --   --  2.1  --   --  1.9 2.1 2.0  LATICACIDVEN  --  0.9  --  1.1  --   --   --   --   --   --   INR  --  1.1  --   --   --   --   --   --   --   --    < > = values in this interval not displayed.    ------------------------------------------------------------------------------------------------------------------ No results for input(s): CHOL, HDL, LDLCALC, TRIG, CHOLHDL, LDLDIRECT in the last 72 hours.  No results found  for:  HGBA1C ------------------------------------------------------------------------------------------------------------------ No results for input(s): TSH, T4TOTAL, T3FREE, THYROIDAB in the last 72 hours.  Invalid input(s): FREET3  Cardiac Enzymes No results for input(s): CKMB, TROPONINI, MYOGLOBIN in the last 168 hours.  Invalid input(s): CK ------------------------------------------------------------------------------------------------------------------ No results found for: BNP  Micro Results Recent Results (from the past 240 hour(s))  Culture, blood (routine x 2)     Status: None (Preliminary result)   Collection Time: 10/15/20 12:20 PM   Specimen: BLOOD LEFT FOREARM  Result Value Ref Range Status   Specimen Description BLOOD LEFT FOREARM  Final   Special Requests   Final    BOTTLES DRAWN AEROBIC AND ANAEROBIC Blood Culture adequate volume   Culture   Final    NO GROWTH 4 DAYS Performed at [facility], [address], [place], [place] [address]    Report Status PENDING  Incomplete  Gastrointestinal Panel by PCR , Stool     Status: Abnormal   Collection Time: 10/15/20  2:04 PM   Specimen: Stool  Result Value Ref Range Status   Campylobacter species NOT DETECTED NOT DETECTED Final   Plesimonas shigelloides NOT DETECTED NOT DETECTED Final   Salmonella species NOT DETECTED NOT DETECTED Final   Yersinia enterocolitica NOT DETECTED NOT DETECTED Final   Vibrio species NOT DETECTED NOT DETECTED Final   Vibrio cholerae NOT DETECTED NOT DETECTED Final   Enteroaggregative E coli (EAEC) NOT DETECTED NOT DETECTED Final   Enteropathogenic E coli (EPEC) NOT DETECTED NOT DETECTED Final   Enterotoxigenic E coli (ETEC) NOT DETECTED NOT DETECTED Final   Shiga like toxin producing E coli (STEC) NOT DETECTED NOT DETECTED Final   Shigella/Enteroinvasive E coli (EIEC) DETECTED (A) NOT DETECTED Final    Comment: RESULT CALLED TO, READ BACK BY AND VERIFIED WITH: [facility] TETTEH RN 0034 10/16/20  HNM    Cryptosporidium NOT DETECTED NOT DETECTED Final   Cyclospora cayetanensis NOT DETECTED NOT DETECTED Final   Entamoeba histolytica NOT DETECTED NOT DETECTED Final   Giardia lamblia NOT DETECTED NOT DETECTED Final   Adenovirus F40/41 NOT DETECTED NOT DETECTED Final   Astrovirus NOT DETECTED NOT DETECTED Final   Norovirus GI/GII NOT DETECTED NOT DETECTED Final   Rotavirus A NOT DETECTED NOT DETECTED Final   Sapovirus (I, II, IV, and V) NOT DETECTED NOT DETECTED Final    Comment: Performed at [facility], [address] [place], [place] [address]  C Difficile Quick Screen w PCR reflex     Status: None   Collection Time: 10/15/20  2:04 PM   Specimen: Stool  Result Value Ref Range Status   C Diff antigen NEGATIVE NEGATIVE Final   C Diff toxin NEGATIVE NEGATIVE Final   C Diff interpretation No C. difficile detected.  Final    Comment: Performed at [facility], [address], [place], [place] [address]  Culture, blood (routine x 2)     Status: None (Preliminary result)   Collection Time: 10/15/20  2:30 PM   Specimen: BLOOD RIGHT ARM  Result Value Ref Range Status   Specimen Description BLOOD RIGHT ARM  Final   Special Requests   Final    BOTTLES DRAWN AEROBIC AND ANAEROBIC Blood Culture results may not be optimal due to an inadequate volume of blood received in culture bottles   Culture   Final    NO GROWTH 4 DAYS Performed at [facility], [address], [place], [place] [address]    Report Status PENDING  Incomplete  MRSA PCR Screening     Status: None   Collection Time: 10/15/20  7:10 PM   Specimen: Nasal Mucosa; Nasopharyngeal  Result Value Ref Range Status   MRSA by PCR NEGATIVE NEGATIVE Final    Comment:        The GeneXpert MRSA Assay (FDA approved for NASAL specimens only), is one component of a comprehensive MRSA colonization surveillance program. It is not intended to diagnose MRSA infection nor to guide or monitor treatment for MRSA  infections. Performed at [facility], [address], [place], [place] [address]     Radiology Reports CT Chest W Contrast  Result Date: 10/15/2020 CLINICAL DATA:  Nausea and vomiting. HIV. Persistent vomiting and diarrhea with abdominal pain for 1 month. Cough. Weakness. EXAM: CT CHEST, ABDOMEN, AND PELVIS WITH CONTRAST TECHNIQUE: Multidetector CT imaging of the chest, abdomen and pelvis was performed following the standard protocol during bolus administration of intravenous contrast. CONTRAST:  OMNIPAQUE IOHEXOL 300 MG/ML  SOLN COMPARISON:  Chest radiograph 08/07/2020. Abdominopelvic CT 02/20/2020. FINDINGS: CT CHEST FINDINGS Cardiovascular: Normal aortic caliber. Normal heart size, without pericardial effusion. No central pulmonary embolism, on this non-dedicated study. Mediastinum/Nodes: No mediastinal or hilar adenopathy. Subtle esophageal fluid level on 37/3. Mild esophageal wall thickening, especially superiorly on 16/3. Lungs/Pleura: No pleural fluid. Mild centrilobular and paraseptal emphysema. Scattered bilateral pulmonary nodules. Many of these, especially in the right upper lobe, are somewhat ill-defined and favored to be infectious/inflammatory. Examples in the right upper lobe on 77 and 84 of series 4. Clustered nodules are also identified within the posterior left upper lobe on 62/4 and 70/4. Other more well-circumscribed nodules are indeterminate. Example in the right lower lobe at 4 mm on 130/4 and the right upper lobe at 4 mm on 66/4. Musculoskeletal: No acute osseous abnormality. CT ABDOMEN PELVIS FINDINGS Hepatobiliary: Normal liver. Normal gallbladder, without biliary ductal dilatation. Pancreas: Normal, without mass or ductal dilatation. Spleen: Normal in size, without focal abnormality. Adrenals/Urinary Tract: Normal adrenal glands. Subtle new uroepithelial mucosal thickening and mild hyperenhancement including within the renal pelvis on 84/3. No hydroureter or bladder  abnormality. No hydronephrosis or renal mass. Stomach/Bowel: Normal stomach, without wall thickening. Mild wall thickening and mucosal hyperenhancement within the left side of the colon, rectum, sigmoid. The transverse and ascending colon are underdistended, but appear moderately thick walled with mucosal hyperenhancement. The terminal ileum is also underdistended but likely thickened including on 104/3. Normal small bowel. Vascular/Lymphatic: Normal aortic caliber. Prominent abdominal retroperitoneal nodes are similar to 02/13/2019 and likely reactive in the setting of HIV. Reproductive: Normal prostate. Other: No significant free fluid.  No free intraperitoneal air. Musculoskeletal: No acute osseous abnormality. IMPRESSION: 1. Diffuse colonic wall thickening with mild mucosal hyperenhancement. This could represent infection (exclude C difficile) or inflammation. Given suspicion of concurrent terminal ileal wall thickening, Crohn disease or ulcerative colitis should be considered. 2. Scattered pulmonary opacities, primarily favored to represent mild atypical infection of indeterminate acuity. Other pulmonary nodules are technically indeterminate. No follow-up needed if patient is low-risk. Non-contrast chest CT can be considered in 12 months if patient is high-risk. This recommendation follows the consensus statement: Guidelines for Management of Incidental Pulmonary Nodules Detected on CT Images: From the Fleischner Society 2017; Radiology 2017; 284:228-243. 3. Esophageal wall thickening is mild, suggesting esophagitis. Esophageal air fluid level suggests dysmotility or gastroesophageal reflux. 4. Emphysema 5. Subtle urothelial mucosal hyperenhancement and wall thickening,primarily in the right renal pelvis. Correlate with urinalysis to exclude ascending infection. Electronically Signed  By: Jeronimo Greaves M.D.   On: 10/15/2020 14:36   CT Abdomen Pelvis W Contrast  Result Date: 10/15/2020 CLINICAL DATA:  Nausea  and vomiting. HIV. Persistent vomiting and diarrhea with abdominal pain for 1 month. Cough. Weakness. EXAM: CT CHEST, ABDOMEN, AND PELVIS WITH CONTRAST TECHNIQUE: Multidetector CT imaging of the chest, abdomen and pelvis was performed following the standard protocol during bolus administration of intravenous contrast. CONTRAST:  OMNIPAQUE IOHEXOL 300 MG/ML  SOLN COMPARISON:  Chest radiograph 08/07/2020. Abdominopelvic CT 02/20/2020. FINDINGS: CT CHEST FINDINGS Cardiovascular: Normal aortic caliber. Normal heart size, without pericardial effusion. No central pulmonary embolism, on this non-dedicated study. Mediastinum/Nodes: No mediastinal or hilar adenopathy. Subtle esophageal fluid level on 37/3. Mild esophageal wall thickening, especially superiorly on 16/3. Lungs/Pleura: No pleural fluid. Mild centrilobular and paraseptal emphysema. Scattered bilateral pulmonary nodules. Many of these, especially in the right upper lobe, are somewhat ill-defined and favored to be infectious/inflammatory. Examples in the right upper lobe on 77 and 84 of series 4. Clustered nodules are also identified within the posterior left upper lobe on 62/4 and 70/4. Other more well-circumscribed nodules are indeterminate. Example in the right lower lobe at 4 mm on 130/4 and the right upper lobe at 4 mm on 66/4. Musculoskeletal: No acute osseous abnormality. CT ABDOMEN PELVIS FINDINGS Hepatobiliary: Normal liver. Normal gallbladder, without biliary ductal dilatation. Pancreas: Normal, without mass or ductal dilatation. Spleen: Normal in size, without focal abnormality. Adrenals/Urinary Tract: Normal adrenal glands. Subtle new uroepithelial mucosal thickening and mild hyperenhancement including within the renal pelvis on 84/3. No hydroureter or bladder abnormality. No hydronephrosis or renal mass. Stomach/Bowel: Normal stomach, without wall thickening. Mild wall thickening and mucosal hyperenhancement within the left side of the colon,  rectum, sigmoid. The transverse and ascending colon are underdistended, but appear moderately thick walled with mucosal hyperenhancement. The terminal ileum is also underdistended but likely thickened including on 104/3. Normal small bowel. Vascular/Lymphatic: Normal aortic caliber. Prominent abdominal retroperitoneal nodes are similar to 02/13/2019 and likely reactive in the setting of HIV. Reproductive: Normal prostate. Other: No significant free fluid.  No free intraperitoneal air. Musculoskeletal: No acute osseous abnormality. IMPRESSION: 1. Diffuse colonic wall thickening with mild mucosal hyperenhancement. This could represent infection (exclude C difficile) or inflammation. Given suspicion of concurrent terminal ileal wall thickening, Crohn disease or ulcerative colitis should be considered. 2. Scattered pulmonary opacities, primarily favored to represent mild atypical infection of indeterminate acuity. Other pulmonary nodules are technically indeterminate. No follow-up needed if patient is low-risk. Non-contrast chest CT can be considered in 12 months if patient is high-risk. This recommendation follows the consensus statement: Guidelines for Management of Incidental Pulmonary Nodules Detected on CT Images: From the Fleischner Society 2017; Radiology 2017; 284:228-243. 3. Esophageal wall thickening is mild, suggesting esophagitis. Esophageal air fluid level suggests dysmotility or gastroesophageal reflux. 4. Emphysema 5. Subtle urothelial mucosal hyperenhancement and wall thickening,primarily in the right renal pelvis. Correlate with urinalysis to exclude ascending infection. Electronically Signed   By: Jeronimo Greaves M.D.   On: 10/15/2020 14:36

## 2020-10-20 ENCOUNTER — Encounter: Payer: Self-pay | Admitting: Gastroenterology

## 2020-10-20 DIAGNOSIS — R112 Nausea with vomiting, unspecified: Secondary | ICD-10-CM | POA: Diagnosis not present

## 2020-10-20 DIAGNOSIS — R197 Diarrhea, unspecified: Secondary | ICD-10-CM | POA: Diagnosis not present

## 2020-10-20 LAB — CULTURE, BLOOD (ROUTINE X 2)
Culture: NO GROWTH
Culture: NO GROWTH
Special Requests: ADEQUATE

## 2020-10-20 LAB — BASIC METABOLIC PANEL
Anion gap: 4 — ABNORMAL LOW (ref 5–15)
BUN: 16 mg/dL (ref 6–20)
CO2: 22 mmol/L (ref 22–32)
Calcium: 8 mg/dL — ABNORMAL LOW (ref 8.9–10.3)
Chloride: 108 mmol/L (ref 98–111)
Creatinine, Ser: 0.83 mg/dL (ref 0.61–1.24)
GFR, Estimated: >60 mL/min (ref >60)
Glucose, Bld: 92 mg/dL (ref 70–99)
Potassium: 4.5 mmol/L (ref 3.5–5.1)
Sodium: 134 mmol/L — ABNORMAL LOW (ref 135–145)

## 2020-10-20 LAB — CBC
HCT: 31.5 % — ABNORMAL LOW (ref 39.0–52.0)
Hemoglobin: 10.1 g/dL — ABNORMAL LOW (ref 13.0–17.0)
MCH: 30.1 pg (ref 26.0–34.0)
MCHC: 32.1 g/dL (ref 30.0–36.0)
MCV: 93.8 fL (ref 80.0–100.0)
Platelets: 203 10*3/uL (ref 150–400)
RBC: 3.36 MIL/uL — ABNORMAL LOW (ref 4.22–5.81)
RDW: 17.3 % — ABNORMAL HIGH (ref 11.5–15.5)
WBC: 3.1 10*3/uL — ABNORMAL LOW (ref 4.0–10.5)
nRBC: 0 % (ref 0.0–0.2)

## 2020-10-20 LAB — HISTOPLASMA ANTIGEN, URINE: Histoplasma Antigen, urine: <0.5 (ref <0.5)

## 2020-10-20 LAB — SURGICAL PATHOLOGY

## 2020-10-20 LAB — EXPECTORATED SPUTUM ASSESSMENT W GRAM STAIN, RFLX TO RESP C

## 2020-10-20 MED ORDER — METOCLOPRAMIDE HCL 5 MG/ML IJ SOLN: 10.0000 mg | Freq: Once | INTRAMUSCULAR | Status: DC

## 2020-10-20 MED ORDER — PEG-KCL-NACL-NASULF-NA ASC-C 100 G PO SOLR
0.5000 | Freq: Once | ORAL | Status: DC
  Filled 2020-10-20: qty 1

## 2020-10-20 MED ORDER — PEG-KCL-NACL-NASULF-NA ASC-C 100 G PO SOLR: 0.5000 | Freq: Once | ORAL | Status: DC

## 2020-10-20 NOTE — Plan of Care (Signed)
Patient is currently resting in bed. VSS. Denies pain.  OOB ambulating in room. Call bell within reach. Diet changed to Clears per order.  Problem: Activity: Goal: Risk for activity intolerance will decrease Outcome: Progressing   Problem: Nutrition: Goal: Adequate nutrition will be maintained Outcome: Progressing   Problem: Coping: Goal: Level of anxiety will decrease Outcome: Progressing   Problem: Elimination: Goal: Will not experience complications related to bowel motility Outcome: Progressing Goal: Will not experience complications related to urinary retention Outcome: Progressing   Problem: Pain Managment: Goal: General experience of comfort will improve Outcome: Progressing   Problem: Safety: Goal: Ability to remain free from injury will improve Outcome: Progressing

## 2020-10-20 NOTE — Progress Notes (Signed)
[facility] Infectious Diseases Follow Up Note  Patient Identification: Patient Name: Jacob Gutierrez MRN: [id number] Admit Date: 10/15/2020 10:24 AM Age: 35 y.o.Today's Date: 10/20/2020   Reason for Visit: AIDS, Pulmonary nodules   Principal Problem:   Nausea, vomiting, and diarrhea Active Problems:   AIDS (acquired immune deficiency syndrome) (HCC)   Protein-calorie malnutrition, severe   Acute pyelonephritis   Hypokalemia   Colitis   Dysphagia  Antibiotics: Ceftriaxone 3/24-                    Fluconazole 3/26-                    Metronidaozole 3/24-c  Lines/Tubes: PIVs   Interval Events: afebrile, hemodynamically stable, planned for colonoscopy on 3/30   Assessment HIV/AIDs with h/o non compliance - On symtuza and bactrim ppx  Shigella/Entero invasive E coli Colitis. CMV DNA not detected. Improving  Pulmonary nodules of unknown etiology - Cryptococcal antigen is negative. Appears to be comfortable in RA, Work up ongoing Possible Candidal Esophagitis - On fluconazole, clinically  improving. No thrush noted. Fu path from EGD.  Subtle urothelial mucosal hyperenhancement and wall thickening,primarily in the right renal pelvis in CT Chest Bicytopenia- likely related to AIDs  Recommendations Follow-up sputum AFB smear and cultures,  Follow up AFB blood cultures ( AFB blood cx was apparently not collected yesterday, spoke with Micro today and planned to get blood for AFB blood cx by phlebotomist today. RN is aware)  Follow-up QuantiFERON Follow-up Fungitell, Blastomyces antigen and histoplasma antigen Follow-up HIV viral load and genotype Follow-up path from EGD Continue Symtuza and Bactrim prophylaxis Continue ceftriaxone and Flagyl.  Anticipate a 10-day course Continue fluconazole.  Plan to treat for 21 days  Rest of the management as per the primary team. Thank you for the consult. Please page with pertinent  questions or concerns.  ______________________________________________________________________ Subjective patient seen and examined at the bedside. Dysphagia is improved. Diarrhea is also improving - 2 episodes of stool in 24 hrs and they are getting more solid.    Vitals BP (!) 103/59 (BP Location: Right Arm)   Pulse 78   Temp 98.5 F (36.9 C) (Oral)   Resp 12   Ht 6\' 1"  (1.854 m)   Wt 69.2 kg   SpO2 94%   BMI 20.13 kg/m     Physical Exam Constitutional:   Not in acute distress, breathing comfortably on room air    Comments:   Cardiovascular:     Rate and Rhythm: Normal rate and regular rhythm.     Heart sounds: No murmur heard.   Pulmonary:     Effort: Pulmonary effort is normal.     Comments:   Abdominal:     Palpations: Abdomen is soft.     Tenderness:   Musculoskeletal:        General: No swelling or tenderness.   Skin:    Comments: No rashes  Neurological:     General: No focal deficit present.   Psychiatric:        Mood and Affect: Mood normal.    Pertinent Microbiology Results for orders placed or performed during the hospital encounter of 10/15/20  Culture, blood (routine x 2)     Status: None (Preliminary result)   Collection Time: 10/15/20 12:20 PM   Specimen: BLOOD LEFT FOREARM  Result Value Ref Range Status   Specimen Description BLOOD LEFT FOREARM  Final   Special Requests   Final  BOTTLES DRAWN AEROBIC AND ANAEROBIC Blood Culture adequate volume   Culture   Final    NO GROWTH 4 DAYS Performed at [facility], [address], [place], [place] [address]    Report Status PENDING  Incomplete  Gastrointestinal Panel by PCR , Stool     Status: Abnormal   Collection Time: 10/15/20  2:04 PM   Specimen: Stool  Result Value Ref Range Status   Campylobacter species NOT DETECTED NOT DETECTED Final   Plesimonas shigelloides NOT DETECTED NOT DETECTED Final   Salmonella species NOT DETECTED NOT DETECTED Final   Yersinia enterocolitica NOT  DETECTED NOT DETECTED Final   Vibrio species NOT DETECTED NOT DETECTED Final   Vibrio cholerae NOT DETECTED NOT DETECTED Final   Enteroaggregative E coli (EAEC) NOT DETECTED NOT DETECTED Final   Enteropathogenic E coli (EPEC) NOT DETECTED NOT DETECTED Final   Enterotoxigenic E coli (ETEC) NOT DETECTED NOT DETECTED Final   Shiga like toxin producing E coli (STEC) NOT DETECTED NOT DETECTED Final   Shigella/Enteroinvasive E coli (EIEC) DETECTED (A) NOT DETECTED Final    Comment: RESULT CALLED TO, READ BACK BY AND VERIFIED WITH: [facility] TETTEH RN [id number] 10/16/20 HNM    Cryptosporidium NOT DETECTED NOT DETECTED Final   Cyclospora cayetanensis NOT DETECTED NOT DETECTED Final   Entamoeba histolytica NOT DETECTED NOT DETECTED Final   Giardia lamblia NOT DETECTED NOT DETECTED Final   Adenovirus F40/41 NOT DETECTED NOT DETECTED Final   Astrovirus NOT DETECTED NOT DETECTED Final   Norovirus GI/GII NOT DETECTED NOT DETECTED Final   Rotavirus A NOT DETECTED NOT DETECTED Final   Sapovirus (I, II, IV, and V) NOT DETECTED NOT DETECTED Final    Comment: Performed at [facility], [address] [place], [place] [address]  C Difficile Quick Screen w PCR reflex     Status: None   Collection Time: 10/15/20  2:04 PM   Specimen: Stool  Result Value Ref Range Status   C Diff antigen NEGATIVE NEGATIVE Final   C Diff toxin NEGATIVE NEGATIVE Final   C Diff interpretation No C. difficile detected.  Final    Comment: Performed at [facility], [address] [place], [place] [address]  Culture, blood (routine x 2)     Status: None (Preliminary result)   Collection Time: 10/15/20  2:30 PM   Specimen: BLOOD RIGHT ARM  Result Value Ref Range Status   Specimen Description BLOOD RIGHT ARM  Final   Special Requests   Final    BOTTLES DRAWN AEROBIC AND ANAEROBIC Blood Culture results may not be optimal due to an inadequate volume of blood received in culture bottles   Culture   Final    NO GROWTH 4  DAYS Performed at [facility], [address], [place], [place] [address]    Report Status PENDING  Incomplete  MRSA PCR Screening     Status: None   Collection Time: 10/15/20  7:10 PM   Specimen: Nasal Mucosa; Nasopharyngeal  Result Value Ref Range Status   MRSA by PCR NEGATIVE NEGATIVE Final    Comment:        The GeneXpert MRSA Assay (FDA approved for NASAL specimens only), is one component of a comprehensive MRSA colonization surveillance program. It is not intended to diagnose MRSA infection nor to guide or monitor treatment for MRSA infections. Performed at [facility], [address], [place], [place] [address]   Expectorated Sputum Assessment w Gram Stain,  Rflx to Resp Cult     Status: None   Collection Time: 10/19/20  6:45 PM   Specimen: Sputum  Result Value Ref Range Status   Specimen Description SPUTUM  Final   Special Requests Immunocompromised  Final   Sputum evaluation   Final    THIS SPECIMEN IS ACCEPTABLE FOR SPUTUM CULTURE Performed at [facility], [address], [place], [place] [address]    Report Status 10/20/2020 FINAL  Final  Culture, Respiratory w Gram Stain     Status: None (Preliminary result)   Collection Time: 10/19/20  6:45 PM   Specimen: SPU  Result Value Ref Range Status   Specimen Description SPUTUM  Final   Special Requests Immunocompromised Reflexed from [id number]  Final   Gram Stain   Final    FEW WBC PRESENT,BOTH PMN AND MONONUCLEAR FEW GRAM POSITIVE RODS RARE GRAM POSITIVE COCCI IN PAIRS Performed at [facility], [address], [place], [place] [address]    Culture PENDING  Incomplete   Report Status PENDING  Incomplete    Pertinent Lab. CBC Latest Ref Rng & Units 10/20/2020 10/19/2020 10/18/2020  WBC 4.0 - 10.5 K/uL 3.1(L) 3.5(L) 3.2(L)  Hemoglobin 13.0 - 17.0 g/dL 10.1(L) 9.9(L) 9.7(L)  Hematocrit 39.0 - 52.0 % 31.5(L) 29.7(L) 30.1(L)  Platelets 150 - 400 K/uL 203 198 166   CMP Latest Ref Rng & Units  10/20/2020 10/19/2020 10/18/2020  Glucose 70 - 99 mg/dL 92 628(B) 151(V)  BUN 6 - 20 mg/dL 16 16 12   Creatinine 0.61 - 1.24 mg/dL 6.16) 0.73(X  Sodium 135 - 145 mmol/L 134(L) 133(L) 133(L)  Potassium 3.5 - 5.1 mmol/L 4.5 3.4(L) 3.4(L)  Chloride 98 - 111 mmol/L 108 107 103  CO2 22 - 32 mmol/L 22 21(L) 24  Calcium 8.9 - 10.3 mg/dL 8.0(L) 7.8(L) 7.9(L)  Total Protein 6.5 - 8.1 g/dL - - -  Total Bilirubin 0.3 - 1.2 mg/dL - - -  Alkaline Phos 38 - 126 U/L - - -  AST 15 - 41 U/L - - -  ALT 0 - 44 U/L - - -    Pertinent Imaging today Plain films and CT images have been personally visualized and interpreted; radiology reports have been reviewed. Decision making incorporated into the Impression / Recommendations.  I have spent approx 30 minutes for this patient encounter including review of prior medical records, coordination of care  with greater than 50% of time being face to face/counseling and discussing diagnostics/treatment plan with the patient/family.  Electronically signed by:   1.06, MD Infectious Disease Physician [facility] Pager: [phone]

## 2020-10-20 NOTE — Progress Notes (Signed)
PROGRESS NOTE                                                                             PROGRESS NOTE                                                                                                                                                                                                             Patient Demographics:    Jacob Gutierrez, is a 35 y.o. male, DOB - [birth year], OLM:[id number]  Outpatient Primary MD for the patient is Patient, No Pcp Per (Inactive)    LOS - 5  Admit date - 10/15/2020    No chief complaint on file.      Brief Narrative    35 y.o. male with medical history significant of AIDS(last CD4 count < 35) with prior infections include Shigella gastroenteritis, syphilis, coccidiomycosis, and recent COVID-19 infection in 07/2020 presents with complaints of nausea, vomiting, and diarrhea for the last month.    Patient presents with diarrhea, dysphagia, CT abdomen pelvis significant for colitis, GI pathogen significant for Shigella, he had endoscopy 3/25 which was significant for severe esophagitis.  Plan for colonoscopy on 3/31    Subjective:    Jacob Gutierrez today diet has improved, diarrhea significantly improved as well vomiting or abdominal pain.     Assessment  & Plan :    Principal Problem:   Nausea, vomiting, and diarrhea Active Problems:   AIDS (acquired immune deficiency syndrome) (HCC)   Protein-calorie malnutrition, severe   Acute pyelonephritis   Hypokalemia   Colitis   Dysphagia  Nausea, vomiting, and diarrhea >>Shigella/Entero invasive E coli Colitis :  -GI pathogen panel showing Shigella , E coli enteroinvasive,  -Antibiotics management per ID, continue with Rocephin and Flagyl . -CMV DNA not detected -Keep on IV fluids.  Appetite has been improving -Diarrhea is improving, appetite has improved as well, will stop his IV fluids.  Dysphagia: Acute.   -status  post endoscopy 3/25, significant for  grade C esophagitis with bleeding found in the entire  esophagus, with diffuse white plaques in the entire esophagus. -Continue with Diflucan and IV Protonix per GI recommendations. -Tolerating soft diet  Pyelonephritis: Acute - Urinalysis was positive for signs of infection.  CT imaging significant for right renal enhancement concerning for asending infection. -Continue empiric antibiotics of Rocephin IV   AIDS -Magement per ID, resumed on  Symtuza -Restarted on Bactrim for PCP prevention -Patient was counseled about medication compliance.  Abnormal CT of the chest/pulmonary nodules of unknown etiology -  Patient noted to have scattered pulmonary opacities concerning for atypical infection along with other pulmonary nodules.  He had recently had COVID-19 in January of this year. -Follow on QuantiFERON, obtain sputum AFB smears and cultures x3  Hypokalemia: Acute.  - repeleting, magnesium and phosphorus within normal limit, monitor closely for refeeding  syndrome.  Macrocytic anemia: Acute. -  Continue to monitor, and drop this morning due to IV hydration  Severe protein calorie malnutrition: Acute on chronic.  Albumin 2.6. -Closely for refeeding syndrome, phosphorus within normal limit  Polysubstance use: Patient admits to using marijuana and tobacco. -Counseled on need of cessation of tobacco and marijuana use  DVT prophylaxis: SCDs  SpO2: 94 %  Recent Labs  Lab 10/15/20 1036 10/15/20 1220 10/15/20 1438 10/16/20 0500 10/17/20 0113 10/18/20 0239 10/19/20 0116 10/20/20 0150  WBC 4.1  --   --  3.9* 3.5* 3.2* 3.5* 3.1*  PLT 218  --   --  164 157 166 198 203  AST 32  --   --   --   --   --   --   --   ALT 16  --   --   --   --   --   --   --   ALKPHOS 60  --   --   --   --   --   --   --   BILITOT 0.7  --   --   --   --   --   --   --   ALBUMIN 2.6*  --   --   --   --   --   --   --   INR  --  1.1  --   --   --   --   --   --   LATICACIDVEN  --  0.9 1.1  --   --    --   --   --        ABG     Component Value Date/Time   HCO3 22.7 10/16/2020 0600   TCO2 30 10/15/2020 1252   ACIDBASEDEF 1.5 10/16/2020 0600   O2SAT 92.6 10/16/2020 0600          Condition - Extremely Guarded  Family Communication  : None at bedside, D/W mother by phone 3/27  Code Status :  Full  Consults  :  GI,ID  Procedures  :  none  PUD Prophylaxis :   Disposition Plan  :    Status is: Inpatient  Remains inpatient appropriate because:IV treatments appropriate due to intensity of illness or inability to take PO   Dispo: The patient is from: Home              Anticipated d/c is to: Home              Patient currently is not medically stable to d/c.   Difficult to place patient No      DVT Prophylaxis  :   SCDs >>  will start Lovenox today, but will hold starting tomorrow in anticipation of colonoscopy 3/30.  Lab Results  Component Value Date   PLT 203 10/20/2020    Diet :  Diet Order            Diet regular Room service appropriate? Yes; Fluid consistency: Thin  Diet effective now                  Inpatient Medications  Scheduled Meds: . Darunavir-Cobicisctat-Emtricitabine-Tenofovir Alafenamide  1 tablet Oral Q breakfast  . fluconazole  400 mg Oral Daily  . lipase/protease/amylase  72,000 Units Oral TID WC  . [START ON 10/21/2020] metoCLOPramide (REGLAN) injection  10 mg Intravenous Once   Followed by  . [START ON 10/21/2020] metoCLOPramide (REGLAN) injection  10 mg Intravenous Once  . metroNIDAZOLE  500 mg Oral Q8H  . pantoprazole  40 mg Oral BID  . [START ON 10/21/2020] peg 3350 powder  0.5 kit Oral Once   And  . [START ON 10/21/2020] peg 3350 powder  0.5 kit Oral Once  . potassium chloride  20 mEq Oral Daily  . sulfamethoxazole-trimethoprim  1 tablet Oral Daily   Continuous Infusions: . cefTRIAXone (ROCEPHIN)  IV 2 g (10/19/20 1903)  . lactated ringers 75 mL/hr at 10/18/20 2213   PRN Meds:.acetaminophen, albuterol,  lipase/protease/amylase, ondansetron (ZOFRAN) IV  Antibiotics  :    Anti-infectives (From admission, onward)   Start     Dose/Rate Route Frequency Ordered Stop   10/17/20 1000  fluconazole (DIFLUCAN) tablet 400 mg        400 mg Oral Daily 10/16/20 1709     10/16/20 1800  fluconazole (DIFLUCAN) IVPB 400 mg        400 mg 100 mL/hr over 120 Minutes Intravenous  Once 10/16/20 1709 10/16/20 2043   10/16/20 1215  sulfamethoxazole-trimethoprim (BACTRIM) 400-80 MG per tablet 1 tablet        1 tablet Oral Daily 10/16/20 1115     10/16/20 0800  Darunavir-Cobicisctat-Emtricitabine-Tenofovir Alafenamide (SYMTUZA) 800-150-200-10 MG TABS 1 tablet        1 tablet Oral Daily with breakfast 10/15/20 1822     10/15/20 1615  cefTRIAXone (ROCEPHIN) 2 g in sodium chloride 0.9 % 100 mL IVPB        2 g 200 mL/hr over 30 Minutes Intravenous Every 24 hours 10/15/20 1608     10/15/20 1615  metroNIDAZOLE (FLAGYL) tablet 500 mg        500 mg Oral Every 8 hours 10/15/20 1608     10/15/20 1330  cefTRIAXone (ROCEPHIN) 2 g in sodium chloride 0.9 % 100 mL IVPB  Status:  Discontinued        2 g 200 mL/hr over 30 Minutes Intravenous  Once 10/15/20 1320 10/15/20 1603        Reeya Bound M.D on 10/20/2020 at 3:55 PM  To page go to [email or web address]   [facility] -  Office  [phone]     Objective:   Vitals:   10/20/20 0444 10/20/20 0445 10/20/20 0816 10/20/20 1229  BP:  (!) 103/59 90/62 100/68  Pulse:   61 61  Resp:  '12 14 14  ' Temp:  98.5 F (36.9 C) 97.8 F (36.6 C) 98 F (36.7 C)  TempSrc:  Oral Oral Oral  SpO2:  94%    Weight: 69.2 kg     Height:        Wt Readings from Last 3 Encounters:  10/20/20 69.2 kg  10/15/20 64.9 kg  10/08/20 68.2 kg     Intake/Output Summary (Last 24 hours) at 10/20/2020 1555 Last data filed at 10/20/2020 0600 Gross per 24 hour  Intake 480 ml  Output 400 ml  Net 80 ml     Physical Exam   Awake Alert, Oriented X 3, frail,  no new F.N deficits,  Normal affect Symmetrical Chest wall movement, Good air movement bilaterally, CTAB RRR,No Gallops,Rubs or new Murmurs, No Parasternal Heave +ve B.Sounds, Abd Soft, No tenderness, No rebound - guarding or rigidity. No Cyanosis, Clubbing or edema, No new Rash or bruise        Data Review:    CBC Recent Labs  Lab 10/16/20 0500 10/17/20 0113 10/18/20 0239 10/19/20 0116 10/20/20 0150  WBC 3.9* 3.5* 3.2* 3.5* 3.1*  HGB 9.8* 9.2* 9.7* 9.9* 10.1*  HCT 29.5* 27.6* 30.1* 29.7* 31.5*  PLT 164 157 166 198 203  MCV 93.1 92.9 92.9 94.0 93.8  MCH 30.9 31.0 29.9 31.3 30.1  MCHC 33.2 33.3 32.2 33.3 32.1  RDW 16.4* 17.0* 16.8* 17.1* 17.3*    Recent Labs  Lab 10/15/20 1036 10/15/20 1220 10/15/20 1252 10/15/20 1438 10/16/20 0500 10/16/20 1016 10/16/20 1830 10/17/20 0113 10/18/20 0239 10/19/20 0116 10/20/20 0150  NA 137  --    < >  --  135  --  135 134* 133* 133* 134*  K 2.8*  --    < >  --  2.4*   < > 2.9* 3.1* 3.4* 3.4* 4.5  CL 102  --   --   --  103  --  106 107 103 107 108  CO2 25  --   --   --  23  --  22 20* 24 21* 22  GLUCOSE 91  --   --   --  86  --  151* 114* 149* 128* 92  BUN 12  --   --   --  8  --  '8 8 12 16 16  ' CREATININE 1.01  --   --   --  1.03  --  1.03 1.10 1.23 1.25* 0.83  CALCIUM 8.5*  --   --   --  7.9*  --  7.7* 7.7* 7.9* 7.8* 8.0*  AST 32  --   --   --   --   --   --   --   --   --   --   ALT 16  --   --   --   --   --   --   --   --   --   --   ALKPHOS 60  --   --   --   --   --   --   --   --   --   --   BILITOT 0.7  --   --   --   --   --   --   --   --   --   --   ALBUMIN 2.6*  --   --   --   --   --   --   --   --   --   --   MG  --   --   --   --  2.1  --   --  1.9 2.1 2.0  --   LATICACIDVEN  --  0.9  --  1.1  --   --   --   --   --   --   --  INR  --  1.1  --   --   --   --   --   --   --   --   --    < > = values in this interval not displayed.     ------------------------------------------------------------------------------------------------------------------ No results for input(s): CHOL, HDL, LDLCALC, TRIG, CHOLHDL, LDLDIRECT in the last 72 hours.  No results found for: HGBA1C ------------------------------------------------------------------------------------------------------------------ No results for input(s): TSH, T4TOTAL, T3FREE, THYROIDAB in the last 72 hours.  Invalid input(s): FREET3  Cardiac Enzymes No results for input(s): CKMB, TROPONINI, MYOGLOBIN in the last 168 hours.  Invalid input(s): CK ------------------------------------------------------------------------------------------------------------------ No results found for: BNP  Micro Results Recent Results (from the past 240 hour(s))  Culture, blood (routine x 2)     Status: None   Collection Time: 10/15/20 12:20 PM   Specimen: BLOOD LEFT FOREARM  Result Value Ref Range Status   Specimen Description BLOOD LEFT FOREARM  Final   Special Requests   Final    BOTTLES DRAWN AEROBIC AND ANAEROBIC Blood Culture adequate volume   Culture   Final    NO GROWTH 5 DAYS Performed at [facility], [address], [place], [place] [address]    Report Status 10/20/2020 FINAL  Final  Gastrointestinal Panel by PCR , Stool     Status: Abnormal   Collection Time: 10/15/20  2:04 PM   Specimen: Stool  Result Value Ref Range Status   Campylobacter species NOT DETECTED NOT DETECTED Final   Plesimonas shigelloides NOT DETECTED NOT DETECTED Final   Salmonella species NOT DETECTED NOT DETECTED Final   Yersinia enterocolitica NOT DETECTED NOT DETECTED Final   Vibrio species NOT DETECTED NOT DETECTED Final   Vibrio cholerae NOT DETECTED NOT DETECTED Final   Enteroaggregative E coli (EAEC) NOT DETECTED NOT DETECTED Final   Enteropathogenic E coli (EPEC) NOT DETECTED NOT DETECTED Final   Enterotoxigenic E coli (ETEC) NOT DETECTED NOT DETECTED Final   Shiga like toxin  producing E coli (STEC) NOT DETECTED NOT DETECTED Final   Shigella/Enteroinvasive E coli (EIEC) DETECTED (A) NOT DETECTED Final    Comment: RESULT CALLED TO, READ BACK BY AND VERIFIED WITH: Oley Balm RN 0034 10/16/20 HNM    Cryptosporidium NOT DETECTED NOT DETECTED Final   Cyclospora cayetanensis NOT DETECTED NOT DETECTED Final   Entamoeba histolytica NOT DETECTED NOT DETECTED Final   Giardia lamblia NOT DETECTED NOT DETECTED Final   Adenovirus F40/41 NOT DETECTED NOT DETECTED Final   Astrovirus NOT DETECTED NOT DETECTED Final   Norovirus GI/GII NOT DETECTED NOT DETECTED Final   Rotavirus A NOT DETECTED NOT DETECTED Final   Sapovirus (I, II, IV, and V) NOT DETECTED NOT DETECTED Final    Comment: Performed at [facility], [address] [place], [place] [address]  C Difficile Quick Screen w PCR reflex     Status: None   Collection Time: 10/15/20  2:04 PM   Specimen: Stool  Result Value Ref Range Status   C Diff antigen NEGATIVE NEGATIVE Final   C Diff toxin NEGATIVE NEGATIVE Final   C Diff interpretation No C. difficile detected.  Final    Comment: Performed at [facility], [place] [address], [place], [place] [address]  Culture, blood (routine x 2)     Status: None   Collection Time: 10/15/20  2:30 PM   Specimen: BLOOD RIGHT ARM  Result Value Ref Range Status   Specimen Description BLOOD RIGHT ARM  Final   Special Requests  Final    BOTTLES DRAWN AEROBIC AND ANAEROBIC Blood Culture results may not be optimal due to an inadequate volume of blood received in culture bottles   Culture   Final    NO GROWTH 5 DAYS Performed at [facility], [place] [address] [place], [place] [address]    Report Status 10/20/2020 FINAL  Final  MRSA PCR Screening     Status: None   Collection Time: 10/15/20  7:10 PM   Specimen: Nasal Mucosa; Nasopharyngeal  Result Value Ref Range Status   MRSA by PCR NEGATIVE NEGATIVE Final    Comment:        The GeneXpert MRSA Assay  (FDA approved for NASAL specimens only), is one component of a comprehensive MRSA colonization surveillance program. It is not intended to diagnose MRSA infection nor to guide or monitor treatment for MRSA infections. Performed at [facility], [place] [address], [place], [place] [address]   Expectorated Sputum Assessment w Gram Stain, Rflx to Resp Cult     Status: None   Collection Time: 10/19/20  6:45 PM   Specimen: Sputum  Result Value Ref Range Status   Specimen Description SPUTUM  Final   Special Requests Immunocompromised  Final   Sputum evaluation   Final    THIS SPECIMEN IS ACCEPTABLE FOR SPUTUM CULTURE Performed at [facility], [place] [address], [place],  [address]    Report Status 10/20/2020 FINAL  Final  Culture, Respiratory w Gram Stain     Status: None (Preliminary result)   Collection Time: 10/19/20  6:45 PM   Specimen: SPU  Result Value Ref Range Status   Specimen Description SPUTUM  Final   Special Requests Immunocompromised Reflexed from [id number]  Final   Gram Stain   Final    FEW WBC PRESENT,BOTH PMN AND MONONUCLEAR FEW GRAM POSITIVE RODS RARE GRAM POSITIVE COCCI IN PAIRS Performed at [facility], [place] [address] [place],  [address]    Culture PENDING  Incomplete   Report Status PENDING  Incomplete    Radiology Reports CT Chest W Contrast  Result Date: 10/15/2020 CLINICAL DATA:  Nausea and vomiting. HIV. Persistent vomiting and diarrhea with abdominal pain for 1 month. Cough. Weakness. EXAM: CT CHEST, ABDOMEN, AND PELVIS WITH CONTRAST TECHNIQUE: Multidetector CT imaging of the chest, abdomen and pelvis was performed following the standard protocol during bolus administration of intravenous contrast. CONTRAST:  162m OMNIPAQUE IOHEXOL 300 MG/ML  SOLN COMPARISON:  Chest radiograph 08/07/2020. Abdominopelvic CT 02/20/2020. FINDINGS: CT CHEST FINDINGS Cardiovascular: Normal aortic caliber. Normal heart size, without pericardial  effusion. No central pulmonary embolism, on this non-dedicated study. Mediastinum/Nodes: No mediastinal or hilar adenopathy. Subtle esophageal fluid level on 37/3. Mild esophageal wall thickening, especially superiorly on 16/3. Lungs/Pleura: No pleural fluid. Mild centrilobular and paraseptal emphysema. Scattered bilateral pulmonary nodules. Many of these, especially in the right upper lobe, are somewhat ill-defined and favored to be infectious/inflammatory. Examples in the right upper lobe on 77 and 84 of series 4. Clustered nodules are also identified within the posterior left upper lobe on 62/4 and 70/4. Other more well-circumscribed nodules are indeterminate. Example in the right lower lobe at 4 mm on 130/4 and the right upper lobe at 4 mm on 66/4. Musculoskeletal: No acute osseous abnormality. CT ABDOMEN PELVIS FINDINGS Hepatobiliary: Normal liver. Normal gallbladder, without biliary ductal dilatation. Pancreas: Normal, without mass or ductal dilatation. Spleen: Normal in size, without focal abnormality. Adrenals/Urinary Tract: Normal adrenal glands. Subtle new uroepithelial  mucosal thickening and mild hyperenhancement including within the renal pelvis on 84/3. No hydroureter or bladder abnormality. No hydronephrosis or renal mass. Stomach/Bowel: Normal stomach, without wall thickening. Mild wall thickening and mucosal hyperenhancement within the left side of the colon, rectum, sigmoid. The transverse and ascending colon are underdistended, but appear moderately thick walled with mucosal hyperenhancement. The terminal ileum is also underdistended but likely thickened including on 104/3. Normal small bowel. Vascular/Lymphatic: Normal aortic caliber. Prominent abdominal retroperitoneal nodes are similar to 02/13/2019 and likely reactive in the setting of HIV. Reproductive: Normal prostate. Other: No significant free fluid.  No free intraperitoneal air. Musculoskeletal: No acute osseous abnormality. IMPRESSION: 1.  Diffuse colonic wall thickening with mild mucosal hyperenhancement. This could represent infection (exclude C difficile) or inflammation. Given suspicion of concurrent terminal ileal wall thickening, Crohn disease or ulcerative colitis should be considered. 2. Scattered pulmonary opacities, primarily favored to represent mild atypical infection of indeterminate acuity. Other pulmonary nodules are technically indeterminate. No follow-up needed if patient is low-risk. Non-contrast chest CT can be considered in 12 months if patient is high-risk. This recommendation follows the consensus statement: Guidelines for Management of Incidental Pulmonary Nodules Detected on CT Images: From the Fleischner Society 2017; Radiology 2017; 284:228-243. 3. Esophageal wall thickening is mild, suggesting esophagitis. Esophageal air fluid level suggests dysmotility or gastroesophageal reflux. 4. Emphysema 5. Subtle urothelial mucosal hyperenhancement and wall thickening,primarily in the right renal pelvis. Correlate with urinalysis to exclude ascending infection. Electronically Signed   By: Abigail Miyamoto M.D.   On: 10/15/2020 14:36   CT Abdomen Pelvis W Contrast  Result Date: 10/15/2020 CLINICAL DATA:  Nausea and vomiting. HIV. Persistent vomiting and diarrhea with abdominal pain for 1 month. Cough. Weakness. EXAM: CT CHEST, ABDOMEN, AND PELVIS WITH CONTRAST TECHNIQUE: Multidetector CT imaging of the chest, abdomen and pelvis was performed following the standard protocol during bolus administration of intravenous contrast. CONTRAST:  138m OMNIPAQUE IOHEXOL 300 MG/ML  SOLN COMPARISON:  Chest radiograph 08/07/2020. Abdominopelvic CT 02/20/2020. FINDINGS: CT CHEST FINDINGS Cardiovascular: Normal aortic caliber. Normal heart size, without pericardial effusion. No central pulmonary embolism, on this non-dedicated study. Mediastinum/Nodes: No mediastinal or hilar adenopathy. Subtle esophageal fluid level on 37/3. Mild esophageal wall  thickening, especially superiorly on 16/3. Lungs/Pleura: No pleural fluid. Mild centrilobular and paraseptal emphysema. Scattered bilateral pulmonary nodules. Many of these, especially in the right upper lobe, are somewhat ill-defined and favored to be infectious/inflammatory. Examples in the right upper lobe on 77 and 84 of series 4. Clustered nodules are also identified within the posterior left upper lobe on 62/4 and 70/4. Other more well-circumscribed nodules are indeterminate. Example in the right lower lobe at 4 mm on 130/4 and the right upper lobe at 4 mm on 66/4. Musculoskeletal: No acute osseous abnormality. CT ABDOMEN PELVIS FINDINGS Hepatobiliary: Normal liver. Normal gallbladder, without biliary ductal dilatation. Pancreas: Normal, without mass or ductal dilatation. Spleen: Normal in size, without focal abnormality. Adrenals/Urinary Tract: Normal adrenal glands. Subtle new uroepithelial mucosal thickening and mild hyperenhancement including within the renal pelvis on 84/3. No hydroureter or bladder abnormality. No hydronephrosis or renal mass. Stomach/Bowel: Normal stomach, without wall thickening. Mild wall thickening and mucosal hyperenhancement within the left side of the colon, rectum, sigmoid. The transverse and ascending colon are underdistended, but appear moderately thick walled with mucosal hyperenhancement. The terminal ileum is also underdistended but likely thickened including on 104/3. Normal small bowel. Vascular/Lymphatic: Normal aortic caliber. Prominent abdominal retroperitoneal nodes are similar to 02/13/2019 and likely reactive in the  setting of HIV. Reproductive: Normal prostate. Other: No significant free fluid.  No free intraperitoneal air. Musculoskeletal: No acute osseous abnormality. IMPRESSION: 1. Diffuse colonic wall thickening with mild mucosal hyperenhancement. This could represent infection (exclude C difficile) or inflammation. Given suspicion of concurrent terminal ileal  wall thickening, Crohn disease or ulcerative colitis should be considered. 2. Scattered pulmonary opacities, primarily favored to represent mild atypical infection of indeterminate acuity. Other pulmonary nodules are technically indeterminate. No follow-up needed if patient is low-risk. Non-contrast chest CT can be considered in 12 months if patient is high-risk. This recommendation follows the consensus statement: Guidelines for Management of Incidental Pulmonary Nodules Detected on CT Images: From the Fleischner Society 2017; Radiology 2017; 284:228-243. 3. Esophageal wall thickening is mild, suggesting esophagitis. Esophageal air fluid level suggests dysmotility or gastroesophageal reflux. 4. Emphysema 5. Subtle urothelial mucosal hyperenhancement and wall thickening,primarily in the right renal pelvis. Correlate with urinalysis to exclude ascending infection. Electronically Signed   By: Abigail Miyamoto M.D.   On: 10/15/2020 14:36

## 2020-10-20 NOTE — Progress Notes (Addendum)
Due to airborne contact precautions, delaying colonoscopy to Thursday at 9 AM. By tomorrow will have completed all sputum collections, AFB smears and resp cultures and will hopefully be off airborne precautions.       See prep, diet orders.  Discussed changes w pt.    Jacob Gutierrez  Plan to delay colon until off air borne precautions Certainly, can be done as out pt as well.  [address]

## 2020-10-21 ENCOUNTER — Encounter (HOSPITAL_COMMUNITY): Payer: Self-pay | Admitting: Internal Medicine

## 2020-10-21 DIAGNOSIS — R197 Diarrhea, unspecified: Secondary | ICD-10-CM | POA: Diagnosis not present

## 2020-10-21 DIAGNOSIS — R112 Nausea with vomiting, unspecified: Secondary | ICD-10-CM | POA: Diagnosis not present

## 2020-10-21 LAB — QUANTIFERON-TB GOLD PLUS (RQFGPL)
QuantiFERON Mitogen Value: 1.18 IU/mL
QuantiFERON Nil Value: 0.07 IU/mL
QuantiFERON TB1 Ag Value: 0.07 IU/mL
QuantiFERON TB2 Ag Value: 0.07 IU/mL

## 2020-10-21 LAB — QUANTIFERON-TB GOLD PLUS: QuantiFERON-TB Gold Plus: NEGATIVE

## 2020-10-21 LAB — FUNGITELL, SERUM: Fungitell Result: <31 pg/mL (ref <80)

## 2020-10-21 MED ORDER — NICOTINE POLACRILEX 2 MG MT GUM
2.0000 mg | CHEWING_GUM | OROMUCOSAL | Status: DC | PRN
  Administered 2020-10-21: 2 mg via ORAL
  Filled 2020-10-21 (×2): qty 1

## 2020-10-21 MED ORDER — PEG-KCL-NACL-NASULF-NA ASC-C 100 G PO SOLR
0.5000 | Freq: Once | ORAL | Status: AC
  Administered 2020-10-22: 100 g via ORAL
  Filled 2020-10-21: qty 1

## 2020-10-21 MED ORDER — METOCLOPRAMIDE HCL 5 MG/ML IJ SOLN
10.0000 mg | Freq: Once | INTRAMUSCULAR | Status: AC
  Administered 2020-10-22: 10 mg via INTRAVENOUS
  Filled 2020-10-21: qty 2

## 2020-10-21 NOTE — Progress Notes (Addendum)
PROGRESS NOTE        PATIENT DETAILS Name: Jacob Gutierrez Age: 35 y.o. Sex: male Date of Birth: [birth year] Admit Date: 10/15/2020 Admitting Physician Norval Morton, MD [id number], No Pcp Per (Inactive)  Brief Narrative: Patient is a 35 y.o. male with history of AIDS-presented with nausea/vomiting and diarrhea-due to enteroinvasive E. coli and Shigella.  Also found to have numerous pulmonary nodules-work-up in progress.  See below for further details.  Significant events: 3/24>> admit for nausea/vomiting/diarrhea-stool studies positive for Shigella/enteroinvasive E. coli.  Significant studies: 3/24>> CT chest: Scattered pulmonary opacities, emphysema 3/24>> CT abdomen/pelvis: Diffuse colonic wall thickening, esophageal wall thickening, subtle urothelial mucosal enhancement 3/25>> IgM CMV: Negative 3/25>> serum cryptococcal antigen negative. 3/26>> urine histoplasma antigen: Negative 3/28>> Gold QuantiFERON negative  Antimicrobial therapy: Rocephin: 3/24>> 3/29 Flagyl: 3/24>> 3/29  Microbiology data: 3/24>> blood culture: No growth 3/24>> GI pathogen panel: Shigella/enteroinvasive E. Coli 3/24>> stool C. difficile: Negative 3/28>> sputum culture: Normal respiratory flora 3/28>> sputum AFB smear: Pending 3/29>> sputum AFB smear: Pending 3/29>> sputum AFB smear: Pending 3/29>> sputum AFB smear: Pending 3/29>> blood AFB culture: Pending  Pathology: 3/25 >>esophageal biopsy: Candida esophagitis 3/25 >>stomach biopsy: Mild chronic inflammation, H. pylori negative 3/25 >>duodenal biopsy: Mild focal nonspecific duodenitis  Procedures : 3/25>> EGD: LA grade C esophagitis with bleeding, diffuse white plaques in the entire esophagus  Consults: ID, GI  DVT Prophylaxis : Place and maintain sequential compression device Start: 10/15/20 1826   Subjective: No chest pain-no shortness of breath-anxious to go home.  Diarrhea has resolved.      Assessment/Plan: Shigella/enteroinvasive E. coli colitis: Improved-GI symptoms have resolved.  FOBT positive: GI following with plans for colonoscopy on 4/1  Exocrine pancreatic insufficiency: Continue Creon  Acute pyelonephritis: Urine culture never sent-however CT imaging significant for pyelonephritis-given immunocompromised status-treated with Rocephin.  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  ID following.  Gold QuantiFERON negative.  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  Macrocytic anemia: Follow CBC-check vitamin B12/folic acid levels.  Severe protein calorie malnutrition  Tobacco/marijuana abuse  Homelessness: Claims lives in a hotel   Diet: Diet Order            Diet regular Room service appropriate? Yes; Fluid consistency: Thin  Diet effective now                  Code Status: Full code   Family Communication:   Disposition Plan: Status is: Inpatient  Remains inpatient appropriate because:Inpatient level of care appropriate due to severity of illness   Dispo: The patient is from: Home              Anticipated d/c is to: Home              Patient currently is not medically stable to d/c.   Difficult to place patient No   Barriers to Discharge: Awaiting AFB smears-colonoscopy scheduled for Friday  Antimicrobial agents: Anti-infectives (From admission, onward)   Start     Dose/Rate Route Frequency Ordered Stop   10/17/20 1000  fluconazole (DIFLUCAN) tablet 400 mg        400 mg Oral Daily 10/16/20 1709     10/16/20 1800  fluconazole (DIFLUCAN) IVPB 400 mg        400 mg 100 mL/hr  over 120 Minutes Intravenous  Once 10/16/20 1709 10/16/20 2043   10/16/20 1215  sulfamethoxazole-trimethoprim (BACTRIM) 400-80 MG per tablet 1 tablet        1 tablet Oral Daily 10/16/20 1115     10/16/20 0800   Darunavir-Cobicisctat-Emtricitabine-Tenofovir Alafenamide (SYMTUZA) 800-150-200-10 MG TABS 1 tablet        1 tablet Oral Daily with breakfast 10/15/20 1822     10/15/20 1615  cefTRIAXone (ROCEPHIN) 2 g in sodium chloride 0.9 % 100 mL IVPB        2 g 200 mL/hr over 30 Minutes Intravenous Every 24 hours 10/15/20 1608     10/15/20 1615  metroNIDAZOLE (FLAGYL) tablet 500 mg        500 mg Oral Every 8 hours 10/15/20 1608     10/15/20 1330  cefTRIAXone (ROCEPHIN) 2 g in sodium chloride 0.9 % 100 mL IVPB  Status:  Discontinued        2 g 200 mL/hr over 30 Minutes Intravenous  Once 10/15/20 1320 10/15/20 1603       Time spent: 25- minutes-Greater than 50% of this time was spent in counseling, explanation of diagnosis, planning of further management, and coordination of care.  MEDICATIONS: Scheduled Meds: . Darunavir-Cobicisctat-Emtricitabine-Tenofovir Alafenamide  1 tablet Oral Q breakfast  . fluconazole  400 mg Oral Daily  . lipase/protease/amylase  72,000 Units Oral TID WC  . [START ON 10/22/2020] metoCLOPramide (REGLAN) injection  10 mg Intravenous Once   Followed by  . [START ON 10/22/2020] metoCLOPramide (REGLAN) injection  10 mg Intravenous Once  . metroNIDAZOLE  500 mg Oral Q8H  . pantoprazole  40 mg Oral BID  . [START ON 10/22/2020] peg 3350 powder  0.5 kit Oral Once   And  . [START ON 10/22/2020] peg 3350 powder  0.5 kit Oral Once  . potassium chloride  20 mEq Oral Daily  . sulfamethoxazole-trimethoprim  1 tablet Oral Daily   Continuous Infusions: . cefTRIAXone (ROCEPHIN)  IV 2 g (10/20/20 1755)   PRN Meds:.acetaminophen, albuterol, lipase/protease/amylase, ondansetron (ZOFRAN) IV   PHYSICAL EXAM: Vital signs: Vitals:   10/20/20 2045 10/21/20 0520 10/21/20 0831 10/21/20 1427  BP: 117/70 107/60 105/69 97/69  Pulse:   95 82  Resp: _0 Temp: 98.2 F (36.8 C) 98.2 F (36.8 C) 98.7 F (37.1 C) 97.8 F (36.6 C)  TempSrc: Oral Oral Axillary Axillary  SpO2: 95% 95%  95% 100%  Weight:      Height:       Filed Weights   10/15/20 1901 10/16/20 1342 10/20/20 0444  Weight: 66.9 kg 66.9 kg 69.2 kg   Body mass index is 20.13 kg/m.   Gen Exam:Alert awake-not in any distress HEENT:atraumatic, normocephalic Chest: B/L clear to auscultation anteriorly CVS:S1S2 regular Abdomen:soft non tender, non distended Extremities:no edema Neurology: Non focal Skin: no rash  I have personally reviewed following labs and imaging studies  LABORATORY DATA: CBC: Recent Labs  Lab 10/16/20 0500 10/17/20 0113 10/18/20 0239 10/19/20 0116 10/20/20 0150  WBC 3.9* 3.5* 3.2* 3.5* 3.1*  HGB 9.8* 9.2* 9.7* 9.9* 10.1*  HCT 29.5* 27.6* 30.1* 29.7* 31.5*  MCV 93.1 92.9 92.9 94.0 93.8  PLT 164 157 166 198 832    Basic Metabolic Panel: Recent Labs  Lab 10/16/20 0500 10/16/20 0601 10/16/20 1016 10/16/20 1830 10/17/20 0113 10/18/20 0239 10/19/20 0116 10/20/20 0150  NA 135  --   --  135 134* 133* 133* 134*  K 2.4*  --    < >  2.9* 3.1* 3.4* 3.4* 4.5  CL 103  --   --  106 107 103 107 108  CO2 23  --   --  22 20* 24 21* 22  GLUCOSE 86  --   --  151* 114* 149* 128* 92  BUN 8  --   --  _0 CREATININE 1.03  --   --  1.03 1.10 1.23 1.25* 0.83  CALCIUM 7.9*  --   --  7.7* 7.7* 7.9* 7.8* 8.0*  MG 2.1  --   --   --  1.9 2.1 2.0  --   PHOS  --  4.0  --   --  3.0 3.2 2.5  --    < > = values in this interval not displayed.    GFR: Estimated Creatinine Clearance: 119.3 mL/min (by C-G formula based on SCr of 0.83 mg/dL).  Liver Function Tests: Recent Labs  Lab 10/15/20 1036  AST 32  ALT 16  ALKPHOS 60  BILITOT 0.7  PROT 8.5*  ALBUMIN 2.6*   No results for input(s): LIPASE, AMYLASE in the last 168 hours. No results for input(s): AMMONIA in the last 168 hours.  Coagulation Profile: Recent Labs  Lab 10/15/20 1220  INR 1.1    Cardiac Enzymes: No results for input(s): CKTOTAL, CKMB, CKMBINDEX, TROPONINI in the last 168 hours.  BNP (last 3  results) No results for input(s): PROBNP in the last 8760 hours.  Lipid Profile: No results for input(s): CHOL, HDL, LDLCALC, TRIG, CHOLHDL, LDLDIRECT in the last 72 hours.  Thyroid Function Tests: No results for input(s): TSH, T4TOTAL, FREET4, T3FREE, THYROIDAB in the last 72 hours.  Anemia Panel: No results for input(s): VITAMINB12, FOLATE, FERRITIN, TIBC, IRON, RETICCTPCT in the last 72 hours.  Urine analysis:    Component Value Date/Time   COLORURINE AMBER (A) 10/15/2020 1200   APPEARANCEUR CLOUDY (A) 10/15/2020 1200   APPEARANCEUR Hazy 07/27/2013 1742   LABSPEC 1.014 10/15/2020 1200   LABSPEC 1.026 07/27/2013 1742   PHURINE 6.0 10/15/2020 1200   GLUCOSEU NEGATIVE 10/15/2020 1200   GLUCOSEU Negative 07/27/2013 1742   HGBUR LARGE (A) 10/15/2020 1200   BILIRUBINUR NEGATIVE 10/15/2020 1200   BILIRUBINUR Negative 07/27/2013 1742   KETONESUR NEGATIVE 10/15/2020 1200   PROTEINUR 100 (A) 10/15/2020 1200   UROBILINOGEN 1 12/17/2013 1118   NITRITE POSITIVE (A) 10/15/2020 1200   LEUKOCYTESUR LARGE (A) 10/15/2020 1200   LEUKOCYTESUR Negative 07/27/2013 1742    Sepsis Labs: Lactic Acid, Venous    Component Value Date/Time   LATICACIDVEN 1.1 10/15/2020 1438    MICROBIOLOGY: Recent Results (from the past 240 hour(s))  Culture, blood (routine x 2)     Status: None   Collection Time: 10/15/20 12:20 PM   Specimen: BLOOD LEFT FOREARM  Result Value Ref Range Status   Specimen Description BLOOD LEFT FOREARM  Final   Special Requests   Final    BOTTLES DRAWN AEROBIC AND ANAEROBIC Blood Culture adequate volume   Culture   Final    NO GROWTH 5 DAYS Performed at [facility], [place] [address], [place], [place] [address]    Report Status 10/20/2020 FINAL  Final  Gastrointestinal Panel by PCR , Stool     Status: Abnormal   Collection Time: 10/15/20  2:04 PM   Specimen: Stool  Result Value Ref Range Status   Campylobacter species NOT DETECTED NOT DETECTED Final   Plesimonas  shigelloides NOT DETECTED NOT DETECTED Final   Salmonella  species NOT DETECTED NOT DETECTED Final   Yersinia enterocolitica NOT DETECTED NOT DETECTED Final   Vibrio species NOT DETECTED NOT DETECTED Final   Vibrio cholerae NOT DETECTED NOT DETECTED Final   Enteroaggregative E coli (EAEC) NOT DETECTED NOT DETECTED Final   Enteropathogenic E coli (EPEC) NOT DETECTED NOT DETECTED Final   Enterotoxigenic E coli (ETEC) NOT DETECTED NOT DETECTED Final   Shiga like toxin producing E coli (STEC) NOT DETECTED NOT DETECTED Final   Shigella/Enteroinvasive E coli (EIEC) DETECTED (A) NOT DETECTED Final    Comment: RESULT CALLED TO, READ BACK BY AND VERIFIED WITH: [facility] TETTEH RN 0034 10/16/20 HNM    Cryptosporidium NOT DETECTED NOT DETECTED Final   Cyclospora cayetanensis NOT DETECTED NOT DETECTED Final   Entamoeba histolytica NOT DETECTED NOT DETECTED Final   Giardia lamblia NOT DETECTED NOT DETECTED Final   Adenovirus F40/41 NOT DETECTED NOT DETECTED Final   Astrovirus NOT DETECTED NOT DETECTED Final   Norovirus GI/GII NOT DETECTED NOT DETECTED Final   Rotavirus A NOT DETECTED NOT DETECTED Final   Sapovirus (I, II, IV, and V) NOT DETECTED NOT DETECTED Final    Comment: Performed at [facility], [address] [place], [place] [address]  C Difficile Quick Screen w PCR reflex     Status: None   Collection Time: 10/15/20  2:04 PM   Specimen: Stool  Result Value Ref Range Status   C Diff antigen NEGATIVE NEGATIVE Final   C Diff toxin NEGATIVE NEGATIVE Final   C Diff interpretation No C. difficile detected.  Final    Comment: Performed at [facility], [place] [address], [place], [place] [address]  Culture, blood (routine x 2)     Status: None   Collection Time: 10/15/20  2:30 PM   Specimen: BLOOD RIGHT ARM  Result Value Ref Range Status   Specimen Description BLOOD RIGHT ARM  Final   Special Requests   Final    BOTTLES DRAWN AEROBIC AND ANAEROBIC Blood Culture results may not be  optimal due to an inadequate volume of blood received in culture bottles   Culture   Final    NO GROWTH 5 DAYS Performed at [facility], [place] [address], [place], [place] [address]    Report Status 10/20/2020 FINAL  Final  MRSA PCR Screening     Status: None   Collection Time: 10/15/20  7:10 PM   Specimen: Nasal Mucosa; Nasopharyngeal  Result Value Ref Range Status   MRSA by PCR NEGATIVE NEGATIVE Final    Comment:        The GeneXpert MRSA Assay (FDA approved for NASAL specimens only), is one component of a comprehensive MRSA colonization surveillance program. It is not intended to diagnose MRSA infection nor to guide or monitor treatment for MRSA infections. Performed at [facility], [place] [address] [place], [place] [address]   Expectorated Sputum Assessment w Gram Stain, Rflx to Resp Cult     Status: None   Collection Time: 10/19/20  6:45 PM   Specimen: Sputum  Result Value Ref Range Status   Specimen Description SPUTUM  Final   Special Requests Immunocompromised  Final   Sputum evaluation   Final    THIS SPECIMEN IS ACCEPTABLE FOR SPUTUM CULTURE Performed at [facility], [place] [address] [place], [place] [address]    Report Status 10/20/2020 FINAL  Final  Culture, Respiratory w Gram Stain     Status: None (Preliminary result)  Collection Time: 10/19/20  6:45 PM   Specimen: SPU  Result Value Ref Range Status   Specimen Description SPUTUM  Final   Special Requests Immunocompromised Reflexed from [id number]  Final   Gram Stain   Final    FEW WBC PRESENT,BOTH PMN AND MONONUCLEAR FEW GRAM POSITIVE RODS RARE GRAM POSITIVE COCCI IN PAIRS    Culture   Final    FEW Normal respiratory flora-no Staph aureus or Pseudomonas seen Performed at [facility], [address], [place], [place] [address]    Report Status PENDING  Incomplete    RADIOLOGY STUDIES/RESULTS: No results found.   LOS: 6 days   Oren Binet, MD  [facility]    To  contact the attending provider between 7A-7P or the covering provider during after hours 7P-7A, please log into the web site [email or web address] and access using universal [place] password for that web site. If you do not have the password, please call the hospital operator.  10/21/2020, 2:53 PM

## 2020-10-21 NOTE — Anesthesia Preprocedure Evaluation (Addendum)
Anesthesia Evaluation  Patient identified by MRN, date of birth, ID band Patient awake    Reviewed: Allergy & Precautions, NPO status , Patient's Chart, lab work & pertinent test results  Airway Mallampati: II  TM Distance: >3 FB Neck ROM: Full    Dental no notable dental hx. (+) Poor Dentition, Chipped, Missing, Loose, Dental Advisory Given,    Pulmonary pneumonia, resolved, Current Smoker,    Pulmonary exam normal breath sounds clear to auscultation       Cardiovascular negative cardio ROS Normal cardiovascular exam Rhythm:Regular Rate:Normal     Neuro/Psych  Headaches, negative psych ROS   GI/Hepatic negative GI ROS, Neg liver ROS, Colitis Diarrhea   Endo/Other  negative endocrine ROSMalnutrition  Renal/GU Renal disease  negative genitourinary   Musculoskeletal negative musculoskeletal ROS (+)   Abdominal   Peds negative pediatric ROS (+)  Hematology  (+) anemia , HIV, AIDS   Anesthesia Other Findings   Reproductive/Obstetrics negative OB ROS Syphilis Condyloma                          Anesthesia Physical Anesthesia Plan  ASA: III  Anesthesia Plan: MAC   Post-op Pain Management:    Induction: Intravenous  PONV Risk Score and Plan: 2 and Treatment may vary due to age or medical condition  Airway Management Planned: Natural Airway and Nasal Cannula  Additional Equipment:   Intra-op Plan:   Post-operative Plan:   Informed Consent:   Plan Discussed with:   Anesthesia Plan Comments:       Anesthesia Quick Evaluation

## 2020-10-21 NOTE — Progress Notes (Addendum)
  Once again the colonoscopy/EGD needs to be delayed because we do not have results of all of his respiratory testing and he is still on airborne precautions.  Going to reschedule the studies for Friday, if it cannot be done then then he will have to be done as an outpatient  S Marcella Dunnaway PA-C

## 2020-10-22 LAB — CULTURE, RESPIRATORY W GRAM STAIN: Culture: NORMAL

## 2020-10-22 LAB — BLASTOMYCES ANTIGEN: Blastomyces Antigen: NOT DETECTED ng/mL

## 2020-10-22 LAB — ACID FAST SMEAR (AFB, MYCOBACTERIA)

## 2020-10-22 LAB — FOLATE: Folate: 4.4 ng/mL — ABNORMAL LOW (ref >5.9)

## 2020-10-22 LAB — VITAMIN B12: Vitamin B-12: 918 pg/mL — ABNORMAL HIGH (ref 180–914)

## 2020-10-22 MED ORDER — FOLIC ACID 1 MG PO TABS
2.0000 mg | ORAL_TABLET | Freq: Every day | ORAL | Status: DC
  Administered 2020-10-22 – 2020-10-23 (×2): 2 mg via ORAL
  Filled 2020-10-22 (×2): qty 2

## 2020-10-22 MED ORDER — SODIUM CHLORIDE 0.9 % IV SOLN: INTRAVENOUS | Status: DC | PRN

## 2020-10-22 NOTE — Progress Notes (Signed)
[facility] Infectious Diseases Follow Up Note  Patient Identification: Patient Name: Jacob Gutierrez MRN: [id number] [place] Date: 10/15/2020 10:24 AM Age: 35 y.o.Today's Date: 10/22/2020   Reason for Visit: Fu on pulmonary nodules   Principal Problem:   Nausea, vomiting, and diarrhea Active Problems:   AIDS (acquired immune deficiency syndrome) ([place])   Protein-calorie malnutrition, severe   Acute pyelonephritis   Hypokalemia   Colitis   Dysphagia  Antibiotics:Ceftriaxone 3/24- Fluconazole 3/26- Metronidaozole 3/24-c  Lines/Tubes:PIVs   Interval Events: Afebrile, hemodynamically stable, AFB smear are pending to clear patient from airborne precautions for proceeding with colonoscopy   Assessment HIV AIDS with history of noncompliance Shiegella/enteroinvasive E. coli colitis Pulmonary nodules of unknown etiology-cryptococcal antigen is negative, histoplasma antigen is negative, Fungitell assay is negative, QuantiFERON is negative, Blastomyces antigen is pending, AFB smear and cultures from sputum and blood are pending Candida esophagitis-path from EGD suggestive of Candida esophagitis  Recommendations Continue ceftriaxone and Flagyl for 10 days total Continue fluconazole for 21 days total Continue Symtuza and Bactrim prophylaxis The results of AFB smear should be back by TOMORROW per [place].  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 But will have AFB smear negative to DC airborne precautions Follow-up on HIV viral load and genotype Follow-up AFB sputum and blood cultures Following  Rest of the management as per the primary team. Thank you for the consult. Please page with pertinent questions or concerns.  ______________________________________________________________________ Subjective patient seen and examined at the bedside.  No new complaints.   He is interested to go home today.  Aphasia has resolved, diarrhea has also resolved.   Vitals BP 90/73 (BP Location: Left Arm)   Pulse 81   Temp 98.1 F (36.7 C) (Oral)   Resp 20   Ht _0  (1.854 m)   Wt 69.2 kg   SpO2 99%   BMI 20.13 kg/m      Physical Exam Not in acute distress, eating breakfast Oral thrush has resolved Chest clear breath sounds CVS regular rate and heart sounds Abdomen soft and nontender  Extremities no pedal edema Skin no rashes Neuro grossly nonfocal   Pertinent Microbiology Results for orders placed or performed during the hospital encounter of 10/15/20  Culture, blood (routine x 2)     Status: None   Collection Time: 10/15/20 12:20 PM   Specimen: BLOOD LEFT FOREARM  Result Value Ref Range Status   Specimen Description BLOOD LEFT FOREARM  Final   Special Requests   Final    BOTTLES DRAWN AEROBIC AND ANAEROBIC Blood Culture adequate volume   Culture   Final    NO GROWTH 5 DAYS Performed at [facility], [address] [place], [place] [address]    Report Status 10/20/2020 FINAL  Final  Gastrointestinal Panel by PCR , Stool     Status: Abnormal   Collection Time: 10/15/20  2:04 PM   Specimen: Stool  Result Value Ref Range Status   Campylobacter species NOT DETECTED NOT DETECTED Final   Plesimonas shigelloides NOT DETECTED NOT DETECTED Final   Salmonella species NOT DETECTED NOT DETECTED Final   Yersinia enterocolitica NOT DETECTED NOT DETECTED Final   Vibrio species NOT DETECTED NOT DETECTED Final   Vibrio cholerae NOT DETECTED NOT DETECTED Final   Enteroaggregative E coli (EAEC) NOT DETECTED NOT DETECTED Final   Enteropathogenic E coli (EPEC) NOT DETECTED NOT DETECTED Final   Enterotoxigenic E coli (ETEC) NOT DETECTED NOT DETECTED Final   Shiga like  toxin producing E coli (STEC) NOT DETECTED NOT DETECTED Final   Shigella/Enteroinvasive E coli (EIEC) DETECTED (A) NOT DETECTED Final    Comment: RESULT CALLED TO, READ BACK BY AND  VERIFIED WITH: Oley Balm RN [id number] 10/16/20 HNM    Cryptosporidium NOT DETECTED NOT DETECTED Final   Cyclospora cayetanensis NOT DETECTED NOT DETECTED Final   Entamoeba histolytica NOT DETECTED NOT DETECTED Final   Giardia lamblia NOT DETECTED NOT DETECTED Final   Adenovirus F40/41 NOT DETECTED NOT DETECTED Final   Astrovirus NOT DETECTED NOT DETECTED Final   Norovirus GI/GII NOT DETECTED NOT DETECTED Final   Rotavirus A NOT DETECTED NOT DETECTED Final   Sapovirus (I, II, IV, and V) NOT DETECTED NOT DETECTED Final    Comment: Performed at [facility], [place] [place], [place] [address]  C Difficile Quick Screen w PCR reflex     Status: None   Collection Time: 10/15/20  2:04 PM   Specimen: Stool  Result Value Ref Range Status   C Diff antigen NEGATIVE NEGATIVE Final   C Diff toxin NEGATIVE NEGATIVE Final   C Diff interpretation No C. difficile detected.  Final    Comment: Performed at [facility], [place] [address] [place], [place] [address]  Culture, blood (routine x 2)     Status: None   Collection Time: 10/15/20  2:30 PM   Specimen: BLOOD RIGHT ARM  Result Value Ref Range Status   Specimen Description BLOOD RIGHT ARM  Final   Special Requests   Final    BOTTLES DRAWN AEROBIC AND ANAEROBIC Blood Culture results may not be optimal due to an inadequate volume of blood received in culture bottles   Culture   Final    NO GROWTH 5 DAYS Performed at [facility], [place] [address] [place], [place] [address]    Report Status 10/20/2020 FINAL  Final  MRSA PCR Screening     Status: None   Collection Time: 10/15/20  7:10 PM   Specimen: Nasal Mucosa; Nasopharyngeal  Result Value Ref Range Status   MRSA by PCR NEGATIVE NEGATIVE Final    Comment:        The GeneXpert MRSA Assay (FDA approved for NASAL specimens only), is one component of a comprehensive MRSA colonization surveillance program. It is not intended to diagnose MRSA infection nor to guide  or monitor treatment for MRSA infections. Performed at [facility], [place] [address] [place], [place] [address]   Expectorated Sputum Assessment w Gram Stain, Rflx to Resp Cult     Status: None   Collection Time: 10/19/20  6:45 PM   Specimen: Sputum  Result Value Ref Range Status   Specimen Description SPUTUM  Final   Special Requests Immunocompromised  Final   Sputum evaluation   Final    THIS SPECIMEN IS ACCEPTABLE FOR SPUTUM CULTURE Performed at [facility], [place] [address] [place], [place] [address]    Report Status 10/20/2020 FINAL  Final  Culture, Respiratory w Gram Stain     Status: None   Collection Time: 10/19/20  6:45 PM   Specimen: SPU  Result Value Ref Range Status   Specimen Description SPUTUM  Final   Special Requests Immunocompromised Reflexed from [id number]  Final   Gram Stain   Final    FEW WBC PRESENT,BOTH PMN AND MONONUCLEAR FEW GRAM POSITIVE RODS RARE GRAM POSITIVE COCCI IN PAIRS    Culture   Final    FEW Normal  respiratory flora-no Staph aureus or Pseudomonas seen Performed at [place] [address], [place], [place] [address]    Report Status 10/22/2020 FINAL  Final  Acid Fast Smear (AFB)     Status: None   Collection Time: 10/20/20  6:49 PM   Specimen: Vein; Blood  Result Value Ref Range Status   AFB Specimen Processing Direct Inoculation  Final    Comment: (NOTE) Performed At: [facility] [place], [place] [id number] Rush Farmer MD [id number]    Acid Fast Smear [id number]  Final    Comment: (NOTE) Test not performed. AFB Smear not performed due to specimen source (blood) or insufficient specimen.    Source (AFB) WHOLE BLOOD  Final    Comment: Performed at [facility], [place] [address] [place], [place] [address]   Pertinent Lab. CBC Latest Ref Rng & Units 10/20/2020 10/19/2020 10/18/2020  WBC 4.0 - 10.5 K/uL 3.1(L) 3.5(L) 3.2(L)  Hemoglobin 13.0 - 17.0 g/dL 10.1(L) 9.9(L) 9.7(L)  Hematocrit  39.0 - 52.0 % 31.5(L) 29.7(L) 30.1(L)  Platelets 150 - 400 K/uL 203 198 166   CMP Latest Ref Rng & Units 10/20/2020 10/19/2020 10/18/2020  Glucose 70 - 99 mg/dL 92 128(H) 149(H)  BUN 6 - 20 mg/dL _0 Creatinine 0.61 - 1.24 mg/dL 0.83 1.25(H) 1.23  Sodium 135 - 145 mmol/L 134(L) 133(L) 133(L)  Potassium 3.5 - 5.1 mmol/L 4.5 3.4(L) 3.4(L)  Chloride 98 - 111 mmol/L 108 107 103  CO2 22 - 32 mmol/L 22 21(L) 24  Calcium 8.9 - 10.3 mg/dL 8.0(L) 7.8(L) 7.9(L)  Total Protein 6.5 - 8.1 g/dL - - -  Total Bilirubin 0.3 - 1.2 mg/dL - - -  Alkaline Phos 38 - 126 U/L - - -  AST 15 - 41 U/L - - -  ALT 0 - 44 U/L - - -    Pertinent Imaging today Plain films and CT images have been personally visualized and interpreted; radiology reports have been reviewed. Decision making incorporated into the Impression / Recommendations.  I have spent approx 30 minutes for this patient encounter including review of prior medical records, coordination of care  with greater than 50% of time being face to face/counseling and discussing diagnostics/treatment plan with the patient/family.  Electronically signed by:   Rosiland Oz, MD Infectious Disease Physician [facility] Pager: [phone]

## 2020-10-22 NOTE — Progress Notes (Signed)
  Proceeding w bowel prep, clear diet for planned colonoscopy 10 AM tomorrow. Really need to have ID release pt from airborne precautions before colonoscopy On 21 d course Diflucan for biopsy confirmed esophageal candidiasis per EGD of 10/16/20.  Diarrhea overall better.     Jennye Moccasin PA-C

## 2020-10-22 NOTE — TOC Initial Note (Signed)
Transition of Care [facility]) - Initial/Assessment Note    Patient Details  Name: Jacob Gutierrez MRN: [id number] Date of Birth: [birth year]  [facility]) CM/SW Contact:    [place] Pink, Student-Social Work Phone Number: 10/22/2020, 1:43 PM  Clinical Narrative:                 CSW intern received consult to discuss housing resources for patient's discharge. CSW intern called patient on room phone due to current airborne precautions. He indicated that he currently has a hotel room that is paid for by [employer] and that the ID clinic helps him arrange transport to his appointments. Patient also said that he gets his prescriptions from the  pharmacy at [facility]. The patient is currently planning on returning to the hotel however, he would also appriciate having housing resources anyway. CSW intern will provide patient with a list of housing resources.        Patient Goals and CMS Choice        Expected Discharge Plan and Services                                                Prior Living Arrangements/Services                       Activities of Daily Living Home Assistive Devices/Equipment: None ADL Screening (condition at time of admission) Patient's cognitive ability adequate to safely complete daily activities?: Yes Is the patient deaf or have difficulty hearing?: No Does the patient have difficulty seeing, even when wearing glasses/contacts?: No Does the patient have difficulty concentrating, remembering, or making decisions?: No Patient able to express need for assistance with ADLs?: Yes Does the patient have difficulty dressing or bathing?: No Independently performs ADLs?: Yes (appropriate for developmental age) Does the patient have difficulty walking or climbing stairs?: No Weakness of Legs: None Weakness of Arms/Hands: None  Permission Sought/Granted                  Emotional Assessment               Admission diagnosis:  Dehydration [E86.0] Hypokalemia [E87.6] AIDS (acquired immune deficiency syndrome) (HCC) [B20] Vomiting and diarrhea [R11.10, R19.7] Urinary tract infection with hematuria, site unspecified [N39.0, R31.9] Patient Active Problem List   Diagnosis Date Noted  . Hypokalemia 10/15/2020  . Nausea, vomiting, and diarrhea 10/15/2020  . Colitis 10/15/2020  . Dysphagia 10/15/2020  . COVID-19 virus infection 08/25/2020  . Dark urine 08/25/2020  . Healthcare maintenance 07/23/2020  . Acute pyelonephritis 02/13/2019  . Prostatitis 10/05/2018  . Leukopenia 09/20/2018  . Thrombocytopenia (HCC) 09/20/2018  . Hyponatremia 09/20/2018  . 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 Headache 04/27/2018  . Condyloma 04/24/2018  . Protein-calorie malnutrition, severe 12/12/2017  . Personal history of MRSA (methicillin resistant Staphylococcus aureus) 12/09/2017  . History of ESBL E. coli infection 12/09/2017  . Diarrhea 03/05/2017  . AIDS (acquired immune deficiency syndrome) (HCC)   . Homelessness   . 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 Tobacco use disorder 01/02/2014  . Human immunodeficiency virus (HIV) disease (HCC) 01/01/2014   PCP:  Patient, No Pcp Per (Inactive) Pharmacy:   [facility] [place], [place] - [address] [place] [address] [place] [address] Phone: [phone] Fax: [phone]  [facility]  Trans[facility] of [place] [place] [address] [place] [address] Phone: [phone] Fax: [phone]     Social Determinants of Health ([place]) Interventions    Readmission Risk Interventions No flowsheet data found.

## 2020-10-22 NOTE — Progress Notes (Addendum)
PROGRESS NOTE        PATIENT DETAILS Name: Jacob Gutierrez Age: 35 y.o. Sex: male Date of Birth: Jan 31, 1986 Admit Date: 10/15/2020 Admitting Physician Norval Morton, MD AJO:INOMVEH, No Pcp Per (Inactive)  Brief Narrative: Patient is a 35 y.o. male with history of AIDS-presented with nausea/vomiting and diarrhea-due to enteroinvasive E. coli and Shigella.  Also found to have numerous pulmonary nodules-work-up in progress.  See below for further details.  Significant events: 3/24>> admit for nausea/vomiting/diarrhea-stool studies positive for Shigella/enteroinvasive E. coli.  Significant studies: 3/24>> CT chest: Scattered pulmonary opacities, emphysema 3/24>> CT abdomen/pelvis: Diffuse colonic wall thickening, esophageal wall thickening, subtle urothelial mucosal enhancement 3/25>> IgM CMV: Negative 3/25>> serum cryptococcal antigen negative. 3/26>> urine histoplasma antigen: Negative 3/28>> Gold QuantiFERON negative  Antimicrobial therapy: Rocephin: 3/24>>  Flagyl: 3/24>> 3/29  Microbiology data: 3/24>> blood culture: No growth 3/24>> GI pathogen panel: Shigella/enteroinvasive E. Coli 3/24>> stool C. difficile: Negative 3/28>> sputum culture: Normal respiratory flora 3/28>> sputum AFB smear: Pending 3/29>> sputum AFB smear: Pending 3/29>> sputum AFB smear: Pending 3/29>> sputum AFB smear: Pending 3/29>> blood AFB culture: Pending  Pathology: 3/25 >>esophageal biopsy: Candida esophagitis 3/25 >>stomach biopsy: Mild chronic inflammation, H. pylori negative 3/25 >>duodenal biopsy: Mild focal nonspecific duodenitis  Procedures : 3/25>> EGD: LA grade C esophagitis with bleeding, diffuse white plaques in the entire esophagus  Consults: ID, GI  DVT Prophylaxis : Place and maintain sequential compression device Start: 10/15/20 1826   Subjective: Lying comfortably in bed-no chest pain, shortness of  breath.   Assessment/Plan: Shigella/enteroinvasive E. coli colitis: Improved-GI symptoms have resolved.  FOBT positive: GI following with plans for colonoscopy on 4/1  Exocrine pancreatic insufficiency: Continue Creon  Acute pyelonephritis: Urine culture never sent-however CT imaging significant for pyelonephritis-given immunocompromised status-treated with Rocephin.  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  ID following.  Gold QuantiFERON negative.  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  Macrocytic anemia: Follow CBC periodically-vitamin B12 levels are normal-folic acid levels are low-have started folic acid supplementation  Severe protein calorie malnutrition  Tobacco/marijuana abuse  Homelessness: Claims lives in a hotel   Diet: Diet Order            Diet regular Room service appropriate? Yes; Fluid consistency: Thin  Diet effective now                  Code Status: Full code   Family Communication:   Disposition Plan: Status is: Inpatient  Remains inpatient appropriate because:Inpatient level of care appropriate due to severity of illness   Dispo: The patient is from: Home              Anticipated d/c is to: Home              Patient currently is not medically stable to d/c.   Difficult to place patient No   Barriers to Discharge: Awaiting AFB smears-colonoscopy scheduled for Friday  Antimicrobial agents: Anti-infectives (From admission, onward)   Start     Dose/Rate Route Frequency Ordered Stop   10/17/20 1000  fluconazole (DIFLUCAN) tablet 400 mg        400 mg Oral Daily 10/16/20 1709     10/16/20 1800  fluconazole (DIFLUCAN) IVPB 400 mg        400  mg 100 mL/hr over 120 Minutes Intravenous  Once 10/16/20 1709 10/16/20 2043   10/16/20 1215  sulfamethoxazole-trimethoprim (BACTRIM) 400-80 MG per tablet 1  tablet        1 tablet Oral Daily 10/16/20 1115     10/16/20 0800  Darunavir-Cobicisctat-Emtricitabine-Tenofovir Alafenamide (SYMTUZA) 800-150-200-10 MG TABS 1 tablet        1 tablet Oral Daily with breakfast 10/15/20 1822     10/15/20 1615  cefTRIAXone (ROCEPHIN) 2 g in sodium chloride 0.9 % 100 mL IVPB        2 g 200 mL/hr over 30 Minutes Intravenous Every 24 hours 10/15/20 1608 10/24/20 2359   10/15/20 1615  metroNIDAZOLE (FLAGYL) tablet 500 mg        500 mg Oral Every 8 hours 10/15/20 1608 10/24/20 2359   10/15/20 1330  cefTRIAXone (ROCEPHIN) 2 g in sodium chloride 0.9 % 100 mL IVPB  Status:  Discontinued        2 g 200 mL/hr over 30 Minutes Intravenous  Once 10/15/20 1320 10/15/20 1603       Time spent: 15- minutes-Greater than 50% of this time was spent in counseling, explanation of diagnosis, planning of further management, and coordination of care.  MEDICATIONS: Scheduled Meds: . Darunavir-Cobicisctat-Emtricitabine-Tenofovir Alafenamide  1 tablet Oral Q breakfast  . fluconazole  400 mg Oral Daily  . folic acid  2 mg Oral Daily  . lipase/protease/amylase  72,000 Units Oral TID WC  . metoCLOPramide (REGLAN) injection  10 mg Intravenous Once   Followed by  . metoCLOPramide (REGLAN) injection  10 mg Intravenous Once  . metroNIDAZOLE  500 mg Oral Q8H  . pantoprazole  40 mg Oral BID  . peg 3350 powder  0.5 kit Oral Once   And  . peg 3350 powder  0.5 kit Oral Once  . potassium chloride  20 mEq Oral Daily  . sulfamethoxazole-trimethoprim  1 tablet Oral Daily   Continuous Infusions: . cefTRIAXone (ROCEPHIN)  IV 2 g (10/21/20 1729)   PRN Meds:.acetaminophen, albuterol, lipase/protease/amylase, nicotine polacrilex, ondansetron (ZOFRAN) IV   PHYSICAL EXAM: Vital signs: Vitals:   10/21/20 0831 10/21/20 1427 10/21/20 1927 10/22/20 0416  BP: 1_0 90/73  Pulse: 95 82 79 81  Resp: _1 Temp: 98.7 F (37.1 C) 97.8 F (36.6 C) 98.4 F (36.9 C) 98.1 F  (36.7 C)  TempSrc: Axillary Axillary Oral Oral  SpO2: 95% 100%  99%  Weight:      Height:       Filed Weights   10/15/20 1901 10/16/20 1342 10/20/20 0444  Weight: 66.9 kg 66.9 kg 69.2 kg   Body mass index is 20.13 kg/m.   Gen Exam:Alert awake-not in any distress HEENT:atraumatic, normocephalic Chest: B/L clear to auscultation anteriorly CVS:S1S2 regular Abdomen:soft non tender, non distended Extremities:no edema Neurology: Non focal Skin: no rash  I have personally reviewed following labs and imaging studies  LABORATORY DATA: CBC: Recent Labs  Lab 10/16/20 0500 10/17/20 0113 10/18/20 0239 10/19/20 0116 10/20/20 0150  WBC 3.9* 3.5* 3.2* 3.5* 3.1*  HGB 9.8* 9.2* 9.7* 9.9* 10.1*  HCT 29.5* 27.6* 30.1* 29.7* 31.5*  MCV 93.1 92.9 92.9 94.0 93.8  PLT 164 157 166 198 568    Basic Metabolic Panel: Recent Labs  Lab 10/16/20 0500 10/16/20 0601 10/16/20 1016 10/16/20 1830 10/17/20 0113 10/18/20 0239 10/19/20 0116 10/20/20 0150  NA 135  --   --  135 134* 133* 133* 134*  K 2.4*  --    < >  2.9* 3.1* 3.4* 3.4* 4.5  CL 103  --   --  106 107 103 107 108  CO2 23  --   --  22 20* 24 21* 22  GLUCOSE 86  --   --  151* 114* 149* 128* 92  BUN 8  --   --  _0 CREATININE 1.03  --   --  1.03 1.10 1.23 1.25* 0.83  CALCIUM 7.9*  --   --  7.7* 7.7* 7.9* 7.8* 8.0*  MG 2.1  --   --   --  1.9 2.1 2.0  --   PHOS  --  4.0  --   --  3.0 3.2 2.5  --    < > = values in this interval not displayed.    GFR: Estimated Creatinine Clearance: 119.3 mL/min (by C-G formula based on SCr of 0.83 mg/dL).  Liver Function Tests: No results for input(s): AST, ALT, ALKPHOS, BILITOT, PROT, ALBUMIN in the last 168 hours. No results for input(s): LIPASE, AMYLASE in the last 168 hours. No results for input(s): AMMONIA in the last 168 hours.  Coagulation Profile: No results for input(s): INR, PROTIME in the last 168 hours.  Cardiac Enzymes: No results for input(s): CKTOTAL, CKMB,  CKMBINDEX, TROPONINI in the last 168 hours.  BNP (last 3 results) No results for input(s): PROBNP in the last 8760 hours.  Lipid Profile: No results for input(s): CHOL, HDL, LDLCALC, TRIG, CHOLHDL, LDLDIRECT in the last 72 hours.  Thyroid Function Tests: No results for input(s): TSH, T4TOTAL, FREET4, T3FREE, THYROIDAB in the last 72 hours.  Anemia Panel: Recent Labs    10/22/20 0134  VITAMINB12 918*  FOLATE 4.4*    Urine analysis:    Component Value Date/Time   COLORURINE AMBER (A) 10/15/2020 1200   APPEARANCEUR CLOUDY (A) 10/15/2020 1200   APPEARANCEUR Hazy 07/27/2013 1742   LABSPEC 1.014 10/15/2020 1200   LABSPEC 1.026 07/27/2013 1742   PHURINE 6.0 10/15/2020 1200   GLUCOSEU NEGATIVE 10/15/2020 1200   GLUCOSEU Negative 07/27/2013 1742   HGBUR LARGE (A) 10/15/2020 1200   BILIRUBINUR NEGATIVE 10/15/2020 1200   BILIRUBINUR Negative 07/27/2013 1742   KETONESUR NEGATIVE 10/15/2020 1200   PROTEINUR 100 (A) 10/15/2020 1200   UROBILINOGEN 1 12/17/2013 1118   NITRITE POSITIVE (A) 10/15/2020 1200   LEUKOCYTESUR LARGE (A) 10/15/2020 1200   LEUKOCYTESUR Negative 07/27/2013 1742    Sepsis Labs: Lactic Acid, Venous    Component Value Date/Time   LATICACIDVEN 1.1 10/15/2020 1438    MICROBIOLOGY: Recent Results (from the past 240 hour(s))  Culture, blood (routine x 2)     Status: None   Collection Time: 10/15/20 12:20 PM   Specimen: BLOOD LEFT FOREARM  Result Value Ref Range Status   Specimen Description BLOOD LEFT FOREARM  Final   Special Requests   Final    BOTTLES DRAWN AEROBIC AND ANAEROBIC Blood Culture adequate volume   Culture   Final    NO GROWTH 5 DAYS Performed at [facility], [place] [address] [place], [place] [address]    Report Status 10/20/2020 FINAL  Final  Gastrointestinal Panel by PCR , Stool     Status: Abnormal   Collection Time: 10/15/20  2:04 PM   Specimen: Stool  Result Value Ref Range Status   Campylobacter species NOT DETECTED NOT  DETECTED Final   Plesimonas shigelloides NOT DETECTED NOT DETECTED Final   Salmonella species NOT DETECTED NOT DETECTED Final   Yersinia enterocolitica NOT  DETECTED NOT DETECTED Final   Vibrio species NOT DETECTED NOT DETECTED Final   Vibrio cholerae NOT DETECTED NOT DETECTED Final   Enteroaggregative E coli (EAEC) NOT DETECTED NOT DETECTED Final   Enteropathogenic E coli (EPEC) NOT DETECTED NOT DETECTED Final   Enterotoxigenic E coli (ETEC) NOT DETECTED NOT DETECTED Final   Shiga like toxin producing E coli (STEC) NOT DETECTED NOT DETECTED Final   Shigella/Enteroinvasive E coli (EIEC) DETECTED (A) NOT DETECTED Final    Comment: RESULT CALLED TO, READ BACK BY AND VERIFIED WITH: [facility] TETTEH RN 0034 10/16/20 HNM    Cryptosporidium NOT DETECTED NOT DETECTED Final   Cyclospora cayetanensis NOT DETECTED NOT DETECTED Final   Entamoeba histolytica NOT DETECTED NOT DETECTED Final   Giardia lamblia NOT DETECTED NOT DETECTED Final   Adenovirus F40/41 NOT DETECTED NOT DETECTED Final   Astrovirus NOT DETECTED NOT DETECTED Final   Norovirus GI/GII NOT DETECTED NOT DETECTED Final   Rotavirus A NOT DETECTED NOT DETECTED Final   Sapovirus (I, II, IV, and V) NOT DETECTED NOT DETECTED Final    Comment: Performed at [facility], [address] [place], [place] [address]  C Difficile Quick Screen w PCR reflex     Status: None   Collection Time: 10/15/20  2:04 PM   Specimen: Stool  Result Value Ref Range Status   C Diff antigen NEGATIVE NEGATIVE Final   C Diff toxin NEGATIVE NEGATIVE Final   C Diff interpretation No C. difficile detected.  Final    Comment: Performed at [facility], [place] [address] [place], [place] [address]  Culture, blood (routine x 2)     Status: None   Collection Time: 10/15/20  2:30 PM   Specimen: BLOOD RIGHT ARM  Result Value Ref Range Status   Specimen Description BLOOD RIGHT ARM  Final   Special Requests   Final    BOTTLES DRAWN AEROBIC AND ANAEROBIC Blood  Culture results may not be optimal due to an inadequate volume of blood received in culture bottles   Culture   Final    NO GROWTH 5 DAYS Performed at [facility], [place] [address], [place], [place] [address]    Report Status 10/20/2020 FINAL  Final  MRSA PCR Screening     Status: None   Collection Time: 10/15/20  7:10 PM   Specimen: Nasal Mucosa; Nasopharyngeal  Result Value Ref Range Status   MRSA by PCR NEGATIVE NEGATIVE Final    Comment:        The GeneXpert MRSA Assay (FDA approved for NASAL specimens only), is one component of a comprehensive MRSA colonization surveillance program. It is not intended to diagnose MRSA infection nor to guide or monitor treatment for MRSA infections. Performed at [facility], [place] [address] [place], [place] [address]   Expectorated Sputum Assessment w Gram Stain, Rflx to Resp Cult     Status: None   Collection Time: 10/19/20  6:45 PM   Specimen: Sputum  Result Value Ref Range Status   Specimen Description SPUTUM  Final   Special Requests Immunocompromised  Final   Sputum evaluation   Final    THIS SPECIMEN IS ACCEPTABLE FOR SPUTUM CULTURE Performed at [facility], [place] [address], [place], [place] [address]    Report Status 10/20/2020 FINAL  Final  Culture, Respiratory w Gram Stain     Status: None   Collection Time: 10/19/20  6:45 PM   Specimen: SPU  Result Value  Ref Range Status   Specimen Description SPUTUM  Final   Special Requests Immunocompromised Reflexed from [id number]  Final   Gram Stain   Final    FEW WBC PRESENT,BOTH PMN AND MONONUCLEAR FEW GRAM POSITIVE RODS RARE GRAM POSITIVE COCCI IN PAIRS    Culture   Final    FEW Normal respiratory flora-no Staph aureus or Pseudomonas seen Performed at [facility], [address], [place], [place] [address]    Report Status 10/22/2020 FINAL  Final  Acid Fast Smear (AFB)     Status: None   Collection Time: 10/20/20  6:49 PM   Specimen: Vein; Blood  Result  Value Ref Range Status   AFB Specimen Processing Direct Inoculation  Final    Comment: (NOTE) Performed At: [facility] [place], [place] [id number] Rush Farmer MD SK:[id number]    Acid Fast Smear QNSAFB  Final    Comment: (NOTE) Test not performed. AFB Smear not performed due to specimen source (blood) or insufficient specimen.    Source (AFB) WHOLE BLOOD  Final    Comment: Performed at [facility], [place] [address], [place], [place] [address]    RADIOLOGY STUDIES/RESULTS: No results found.   LOS: 7 days   Oren Binet, MD  [facility]    To contact the attending provider between 7A-7P or the covering provider during after hours 7P-7A, please log into the web site [email or web address] and access using universal [place] password for that web site. If you do not have the password, please call the hospital operator.  10/22/2020, 12:26 PM

## 2020-10-22 NOTE — H&P (View-Only) (Signed)
  Proceeding w bowel prep, clear diet for planned colonoscopy 10 AM tomorrow. Really need to have ID release pt from airborne precautions before colonoscopy On 21 d course Diflucan for biopsy confirmed esophageal candidiasis per EGD of 10/16/20.  Diarrhea overall better.     Afia Messenger PA-C    

## 2020-10-23 ENCOUNTER — Other Ambulatory Visit: Payer: Self-pay | Admitting: Internal Medicine

## 2020-10-23 ENCOUNTER — Encounter (HOSPITAL_COMMUNITY): Payer: Self-pay | Admitting: Internal Medicine

## 2020-10-23 ENCOUNTER — Inpatient Hospital Stay (HOSPITAL_COMMUNITY): Payer: Medicaid Other | Admitting: Anesthesiology

## 2020-10-23 ENCOUNTER — Encounter (HOSPITAL_COMMUNITY)
Admission: EM | Disposition: A | Discharge status: Home / Self Care | Payer: Self-pay | Source: Home / Self Care | Attending: Internal Medicine

## 2020-10-23 DIAGNOSIS — K602 Anal fissure, unspecified: Secondary | ICD-10-CM

## 2020-10-23 DIAGNOSIS — K6289 Other specified diseases of anus and rectum: Secondary | ICD-10-CM

## 2020-10-23 HISTORY — PX: BIOPSY: SHX5522

## 2020-10-23 HISTORY — PX: COLONOSCOPY WITH PROPOFOL: SHX5780

## 2020-10-23 LAB — ACID FAST SMEAR (AFB, MYCOBACTERIA)
Acid Fast Smear: NEGATIVE
Acid Fast Smear: NEGATIVE
Acid Fast Smear: NEGATIVE

## 2020-10-23 SURGERY — COLONOSCOPY WITH PROPOFOL
Anesthesia: Monitor Anesthesia Care

## 2020-10-23 MED ORDER — LACTATED RINGERS IV SOLN: INTRAVENOUS | Status: DC | PRN

## 2020-10-23 MED ORDER — PANTOPRAZOLE SODIUM 40 MG PO TBEC: 40.0000 mg | DELAYED_RELEASE_TABLET | Freq: Two times a day (BID) | ORAL | 0 refills | Status: DC

## 2020-10-23 MED ORDER — PANCRELIPASE (LIP-PROT-AMYL) 36000-114000 UNITS PO CPEP: ORAL_CAPSULE | ORAL | 0 refills | Status: DC

## 2020-10-23 MED ORDER — FLUCONAZOLE 200 MG PO TABS: 400.0000 mg | ORAL_TABLET | Freq: Every day | ORAL | 0 refills | Status: AC

## 2020-10-23 MED ORDER — LACTATED RINGERS IV SOLN: INTRAVENOUS | Status: DC

## 2020-10-23 MED ORDER — SYMTUZA 800-150-200-10 MG PO TABS: 1.0000 | ORAL_TABLET | Freq: Every day | ORAL | 0 refills | Status: DC

## 2020-10-23 MED ORDER — SULFAMETHOXAZOLE-TRIMETHOPRIM 400-80 MG PO TABS: 1.0000 | ORAL_TABLET | Freq: Every day | ORAL | 0 refills | Status: DC

## 2020-10-23 MED ORDER — FERROUS SULFATE 324 (65 FE) MG PO TBEC: 1.0000 | DELAYED_RELEASE_TABLET | Freq: Every day | ORAL | 0 refills | Status: DC

## 2020-10-23 MED ORDER — PROPOFOL 500 MG/50ML IV EMUL
INTRAVENOUS | Status: DC | PRN
  Administered 2020-10-23: 100 ug/kg/min via INTRAVENOUS

## 2020-10-23 MED ORDER — PHENYLEPHRINE HCL (PRESSORS) 10 MG/ML IV SOLN
INTRAVENOUS | Status: DC | PRN
  Administered 2020-10-23: 80 ug via INTRAVENOUS

## 2020-10-23 MED FILL — FERROUS SULFATE 325 MG TAB: 325 (65 FE) | 30 days supply | Qty: 30 | Fill #0

## 2020-10-23 MED FILL — FLUCONAZOLE 200 MG TABLET: 200 | 13 days supply | Qty: 26 | Fill #0

## 2020-10-23 MED FILL — PANTOPRAZOLE SOD DR 40 MG T: 40 | 15 days supply | Qty: 30 | Fill #0

## 2020-10-23 SURGICAL SUPPLY — 22 items

## 2020-10-23 NOTE — Op Note (Signed)
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 Not relaiable to come back d/t                            social issuses Providers:                Lynann Bolognaajesh Lezli Danek, MD, Estella HuskJarmila Fucs RN, RN, Fransisca ConnorsMichael                            Williams, Lawson Radararlene Davis, Technician, Gwenyth Allegraichard Adami                            CRNA, CRNA Referring MD:              Medicines:                Monitored Anesthesia Care Complications:            No immediate complications. Estimated Blood Loss:     Estimated blood loss: none. Procedure:                Pre-Anesthesia Assessment:                           - Prior to the procedure, a History and Physical                            was performed, and patient medications and                            allergies were reviewed. The patient's tolerance of                            previous anesthesia was also reviewed. The risks                            and benefits of the procedure and the sedation                            options and risks were discussed with the patient.                            All questions were answered, and informed consent                            was obtained. Prior Anticoagulants: The patient has  taken no previous anticoagulant or antiplatelet                            agents. ASA Grade Assessment: III - A patient with                            severe systemic disease. After reviewing the risks                            and benefits, the patient was deemed in                            satisfactory condition to undergo the procedure.                           After obtaining informed consent, the  colonoscope                            was passed under direct vision. Throughout the                            procedure, the patient's blood pressure, pulse, and                            oxygen saturations were monitored continuously. The                            PCF-H190DL ([id number]) Olympus pediatric colonoscope                            was introduced through the anus and advanced to the                            2 cm into the ileum. The colonoscopy was performed                            without difficulty. The patient tolerated the                            procedure well. The quality of the bowel                            preparation was fair. The terminal ileum, ileocecal                            valve, appendiceal orifice, and rectum were                            photographed. Scope In: 11:10:05 AM Scope Out: 11:39:53 AM Scope Withdrawal Time: 0 hours 24 minutes 22 seconds  Total Procedure Duration: 0 hours 29 minutes 48 seconds  Findings:      Patchy mild inflammation characterized by erythema without erosions or       ulcers was found in the entire colon, most  prominently in right colon       and in the sigmoid colon and rectum. Biopsies were taken with a cold       forceps for histology from right colon (jar 2) and left colon including       sigmoid colon and rectum (jar 3). Separate biopsies were also obtained       from right colon and sent for CMV/HSV.      The terminal ileum appeared normal. Biopsies were taken with a cold       forceps for histology (Jar 1).      Four 2 to 4 mm mucosal nodules were found in the distal rectum at the       dentate line s/o AIN. Biopsies were taken with a cold forceps for       histology.      An anal fissure was found on perianal exam. Hypertrophied anal papilla       were also visualized. No definite masses or abscesses      The exam was otherwise without abnormality on direct and retroflexion       views. Impression:                - Preparation of the colon was fair.                           - Patchy mild inflammation was found in the entire                            examined colon, likely d/t post-infectious colitis.                            Biopsied.                           - The examined portion of the ileum was normal.                            Biopsied.                           - Anorectal AIN biopsied.                           - Anal fissure found on perianal exam.                           - The examination was otherwise normal on direct                            and retroflexion views. Recommendation:           - Return patient to hospital ward for ongoing care.                           - Resume previous diet.                           - Continue present medications.                           -  Await pathology results.                           - The findings and recommendations were discussed                            with the patient.                           - FU in GI clinic as needed. If any further                            problems or change in symptomatology, would have                            low threshold in repeating colonoscopy after 2-day                            preparation.                           - If with any anorectal problems, would have low                            threshold for surgical consultation for possible                            EUA in future. Procedure Code(s):        --- Professional ---                           [phone], Colonoscopy, flexible; with biopsy, single                            or multiple Diagnosis Code(s):        --- Professional ---                           K52.9, Noninfective gastroenteritis and colitis,                            unspecified                           K62.89, Other specified diseases of anus and rectum                           K60.2, Anal fissure, unspecified CPT copyright 2019 American Medical  Association. All rights reserved. The codes documented in this report are preliminary and upon coder review may  be revised to meet current compliance requirements. Lynann Bologna, MD 10/23/2020 11:55:56 AM This report has been signed electronically. Number of Addenda: 0

## 2020-10-23 NOTE — Transfer of Care (Signed)
Immediate Anesthesia Transfer of Care Note  Patient: Jacob Gutierrez  Procedure(s) Performed: COLONOSCOPY WITH PROPOFOL (N/A ) BIOPSY  Patient Location: Endoscopy Unit  Anesthesia Type:MAC  Level of Consciousness: oriented, drowsy and patient cooperative  Airway & Oxygen Therapy: Patient Spontanous Breathing and Patient connected to nasal cannula oxygen  Post-op Assessment: Report given to RN and Post -op Vital signs reviewed and stable  Post vital signs: Reviewed and stable  Last Vitals:  Vitals Value Taken Time  BP    Temp    Pulse 78 10/23/20 1148  Resp 20 10/23/20 1148  SpO2 100 % 10/23/20 1148  Vitals shown include unvalidated device data.  Last Pain:  Vitals:   10/23/20 1041  TempSrc: Oral  PainSc: 0-No pain         Complications: No complications documented.

## 2020-10-23 NOTE — Progress Notes (Signed)
  [facility] to be D/C'd Home per MD order.  Discussed with the patient and all questions fully answered.   VSS, Skin clean, dry and intact without evidence of skin break down, no evidence of skin tears noted. IV catheter discontinued intact. Site without signs and symptoms of complications. Dressing and pressure applied.   An After Visit Summary was printed and given to the patient. Patient received prescription.   D/c education completed with patient/family including follow up instructions, medication list, d/c activities limitations if indicated, with other d/c instructions as indicated by MD - patient able to verbalize understanding, all questions fully answered.    Patient instructed to return to ED, call 911, or call MD for any changes in condition.    Patient escorted via WC, and D/C home via private auto.   Newton Pigg RN

## 2020-10-23 NOTE — Progress Notes (Signed)
ID Brief Note  Patient does not have any respiratory symptoms. 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 AFB smear is negative. AFB blood and sputum cultures are pending. This can be followed as Outpatient. OK to DC air borne precautions.   Patient is supposed to go for colonoscopy today. Prelim - patchy colitis- likely due to recent colitis and 3-4 anorectal AINs.  Will plan for 30 days supply of symtuza and bactrim when he gets discharged Fluconazole to complete 21 days course Will make a follow up with [facility] in 1-2 weeks   Rosiland Oz, MD Infectious Disease Physician [facility] [address] [place], [place] [address] Phone: [phone]  Fax: [phone]

## 2020-10-23 NOTE — Anesthesia Procedure Notes (Signed)
Procedure Name: MAC Date/Time: 10/23/2020 11:12 AM Performed by: Eligha Bridegroom, CRNA Pre-anesthesia Checklist: Patient identified, Emergency Drugs available, Suction available, Patient being monitored and Timeout performed Patient Re-evaluated:Patient Re-evaluated prior to induction Oxygen Delivery Method: Nasal cannula Preoxygenation: Pre-oxygenation with 100% oxygen Induction Type: IV induction

## 2020-10-23 NOTE — Discharge Summary (Signed)
PATIENT DETAILS Name: Jacob Gutierrez Age: 35 y.o. Sex: male Date of Birth: Jun 30, 1986 MRN: [id number]. Admitting Physician: Clydie Braun, MD ALP:[id number], No Pcp Per (Inactive)  Admit Date: 10/15/2020 Discharge date: 10/23/2020  Recommendations for Outpatient Follow-up:  1. Follow up with PCP in 1-2 weeks 2. Please obtain CMP/CBC in one week 3. Please ensure follow-up with ID 4. Please follow colonoscopy-biopsy results. 5. Follow AFB sputum culture-and AFB blood culture results until negative  Admitted From:  Home  Disposition: Home   Home Health: No  Equipment/Devices: None  Discharge Condition: Stable  CODE STATUS: FULL CODE  Diet recommendation:  Diet Order            Diet regular Room service appropriate? Yes; Fluid consistency: Thin  Diet effective now           Diet - low sodium heart healthy                  Brief Narrative: Patient is a 35 y.o. male with history of AIDS-presented with nausea/vomiting and diarrhea-due to enteroinvasive E. coli and Shigella.  Also found to have numerous pulmonary nodules-work-up in progress.  See below for further details.  Significant events: 3/24>> admit for nausea/vomiting/diarrhea-stool studies positive for Shigella/enteroinvasive E. coli.  Significant studies: 3/24>> CT chest: Scattered pulmonary opacities, emphysema 3/24>> CT abdomen/pelvis: Diffuse colonic wall thickening, esophageal wall thickening, subtle urothelial mucosal enhancement 3/25>> IgM CMV: Negative 3/25>> serum cryptococcal antigen negative. 3/26>> urine histoplasma antigen: Negative 3/28>> Gold QuantiFERON negative  Antimicrobial therapy: Rocephin: 3/24>>  4/1 Flagyl: 3/24>> 4/1  Microbiology data: 3/24>> blood culture: No growth 3/24>> GI pathogen panel: Shigella/enteroinvasive E. Coli 3/24>> stool C. difficile: Negative 3/28>> sputum culture: Normal respiratory flora 3/28>> sputum AFB smear: neg 3/29>> sputum AFB smear:  neg 3/29>> sputum AFB smear: neg 3/29>> blood AFB culture: neg  Pathology: 3/25 >>esophageal biopsy: Candida esophagitis 3/25 >>stomach biopsy: Mild chronic inflammation, H. pylori negative 3/25 >>duodenal biopsy: Mild focal nonspecific duodenitis  Procedures : 3/25>> EGD: LA grade C esophagitis with bleeding, diffuse white plaques in the entire esophagus 4/1>> colonoscopy: Patchy mild inflammation in the entire colon-likely due to postinfectious colitis, anorectal AIN, anal fissure  Consults: ID, GI  Brief Hospital Course: Shigella/enteroinvasive E. coli colitis: Improved-GI symptoms have resolved.  FOBT positive: GI following underwent colonoscopy on 4/1-see above-please follow biopsy results.  Exocrine pancreatic insufficiency: Continue Creon  Acute pyelonephritis: Urine culture never sent-however CT imaging significant for pyelonephritis-given immunocompromised status-treated with Rocephin.  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  ID followed closely-recommendations are for outpatient follow-up-ID will follow final culture results.  Gold QuantiFERON negative.  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  Macrocytic anemia: Follow CBC periodically-vitamin B12 levels are normal-folic acid levels are low-have started folic acid supplementation  Severe protein calorie malnutrition  Tobacco/marijuana abuse  Homelessness: Claims lives in a hotel  Discharge Diagnoses:  Principal Problem:   Nausea, vomiting, and diarrhea Active Problems:   AIDS (acquired immune deficiency syndrome) (HCC)   Protein-calorie malnutrition, severe   Acute pyelonephritis   Hypokalemia   Colitis   Dysphagia   Discharge Instructions:  Activity:  As tolerated Discharge Instructions    Diet - low sodium heart healthy    Complete by: As directed    Discharge instructions   Complete by: As directed    Follow with Primary MD in 1-2 weeks  Follow at the ID  clinic as instructed  Please get a complete blood count and chemistry panel checked by your Primary MD at your next visit, and again as instructed by your Primary MD.  Get Medicines reviewed and adjusted: Please take all your medications with you for your next visit with your Primary MD  Laboratory/radiological data: Please request your Primary MD to go over all hospital tests and procedure/radiological results at the follow up, please ask your Primary MD to get all Hospital records sent to his/her office.  In some cases, they will be blood work, cultures and biopsy results pending at the time of your discharge. Please request that your primary care M.D. follows up on these results.  Also Note the following: If you experience worsening of your admission symptoms, develop shortness of breath, life threatening emergency, suicidal or homicidal thoughts you must seek medical attention immediately by calling 911 or calling your MD immediately  if symptoms less severe.  You must read complete instructions/literature along with all the possible adverse reactions/side effects for all the Medicines you take and that have been prescribed to you. Take any new Medicines after you have completely understood and accpet all the possible adverse reactions/side effects.   Do not drive when taking Pain medications or sleeping medications (Benzodaizepines)  Do not take more than prescribed Pain, Sleep and Anxiety Medications. It is not advisable to combine anxiety,sleep and pain medications without talking with your primary care practitioner  Special Instructions: If you have smoked or chewed Tobacco  in the last 2 yrs please stop smoking, stop any regular Alcohol  and or any Recreational drug use.  Wear Seat belts while driving.  Please note: You were cared for by a  hospitalist during your hospital stay. Once you are discharged, your primary care physician will handle any further medical issues. Please note that NO REFILLS for any discharge medications will be authorized once you are discharged, as it is imperative that you return to your primary care physician (or establish a relationship with a primary care physician if you do not have one) for your post hospital discharge needs so that they can reassess your need for medications and monitor your lab values.   Your sputum culture results are pending-please ask your primary care practitioner primary infectious disease specialist to follow these results.  Your colonoscopy biopsy results are pending-please ask your primary care practitioner to follow these results.   Increase activity slowly   Complete by: As directed      Allergies as of 10/23/2020      Reactions   Vancomycin Anaphylaxis, Itching, Swelling, Other (See Comments)   Angioedema and "everything swells"   Amoxicillin Other (See Comments)   From childhood: "I had a reaction when i was little." (??)      Medication List    STOP taking these medications   omeprazole 20 MG capsule Commonly known as: PRILOSEC   ondansetron 8 MG disintegrating tablet Commonly known as: ZOFRAN-ODT   sulfamethoxazole-trimethoprim 800-160 MG tablet Commonly known as: Bactrim DS Replaced by: sulfamethoxazole-trimethoprim 400-80 MG tablet     TAKE these medications   ferrous sulfate 324 (65 Fe) MG Tbec Take 1 tablet (325 mg total) by mouth daily after breakfast.   fluconazole 200 MG tablet Commonly known as: DIFLUCAN Take 2 tablets (400 mg total) by mouth daily for 13 days. Start taking on: October 24, 2020   lipase/protease/amylase 16109 UNITS Cpep capsule Commonly known as: Creon Take 2 capsules (72,000 units) by mouth WITH every meal,  take 1 capsule (36,000 units) by mouth with every snack. What changed:   how much to take  how to take this  when to  take this  additional instructions   loperamide 2 MG tablet Commonly known as: Imodium A-D Take 1 tablet (2 mg total) by mouth every 8 (eight) hours. What changed:   when to take this  reasons to take this   pantoprazole 40 MG tablet Commonly known as: PROTONIX Take 1 tablet (40 mg total) by mouth 2 (two) times daily.   potassium chloride 10 MEQ tablet Commonly known as: KLOR-CON Take 2 tablets (20 mEq total) by mouth daily.   sulfamethoxazole-trimethoprim 400-80 MG tablet Commonly known as: BACTRIM Take 1 tablet by mouth daily. Replaces: sulfamethoxazole-trimethoprim 800-160 MG tablet   Symtuza 800-150-200-10 MG Tabs Generic drug: Darunavir-Cobicisctat-Emtricitabine-Tenofovir Alafenamide Take 1 tablet by mouth daily with breakfast.       Follow-up Information    [name], [name], MD. Schedule an appointment as soon as possible for a visit in 1 week(s).   Specialty: Infectious Diseases Contact information: [address] [place] [address] [phone]              Allergies  Allergen Reactions  . Vancomycin Anaphylaxis, Itching, Swelling and Other (See Comments)    Angioedema and "everything swells"  . Amoxicillin Other (See Comments)    From childhood: "I had a reaction when i was little." (??)     Other Procedures/Studies: CT Chest W Contrast  Result Date: 10/15/2020 CLINICAL DATA:  Nausea and vomiting. HIV. Persistent vomiting and diarrhea with abdominal pain for 1 month. Cough. Weakness. EXAM: CT CHEST, ABDOMEN, AND PELVIS WITH CONTRAST TECHNIQUE: Multidetector CT imaging of the chest, abdomen and pelvis was performed following the standard protocol during bolus administration of intravenous contrast. CONTRAST:  OMNIPAQUE IOHEXOL 300 MG/ML  SOLN COMPARISON:  Chest radiograph 08/07/2020. Abdominopelvic CT 02/20/2020. FINDINGS: CT CHEST FINDINGS Cardiovascular: Normal aortic caliber. Normal heart size, without pericardial effusion. No  central pulmonary embolism, on this non-dedicated study. Mediastinum/Nodes: No mediastinal or hilar adenopathy. Subtle esophageal fluid level on 37/3. Mild esophageal wall thickening, especially superiorly on 16/3. Lungs/Pleura: No pleural fluid. Mild centrilobular and paraseptal emphysema. Scattered bilateral pulmonary nodules. Many of these, especially in the right upper lobe, are somewhat ill-defined and favored to be infectious/inflammatory. Examples in the right upper lobe on 77 and 84 of series 4. Clustered nodules are also identified within the posterior left upper lobe on 62/4 and 70/4. Other more well-circumscribed nodules are indeterminate. Example in the right lower lobe at 4 mm on 130/4 and the right upper lobe at 4 mm on 66/4. Musculoskeletal: No acute osseous abnormality. CT ABDOMEN PELVIS FINDINGS Hepatobiliary: Normal liver. Normal gallbladder, without biliary ductal dilatation. Pancreas: Normal, without mass or ductal dilatation. Spleen: Normal in size, without focal abnormality. Adrenals/Urinary Tract: Normal adrenal glands. Subtle new uroepithelial mucosal thickening and mild hyperenhancement including within the renal pelvis on 84/3. No hydroureter or bladder abnormality. No hydronephrosis or renal mass. Stomach/Bowel: Normal stomach, without wall thickening. Mild wall thickening and mucosal hyperenhancement within the left side of the colon, rectum, sigmoid. The transverse and ascending colon are underdistended, but appear moderately thick walled with mucosal hyperenhancement. The terminal ileum is also underdistended but likely thickened including on 104/3. Normal small bowel. Vascular/Lymphatic: Normal aortic caliber. Prominent abdominal retroperitoneal nodes are similar to 02/13/2019 and likely reactive in the setting of HIV. Reproductive: Normal prostate. Other: No significant free fluid.  No free intraperitoneal  air. Musculoskeletal: No acute osseous abnormality. IMPRESSION: 1. Diffuse  colonic wall thickening with mild mucosal hyperenhancement. This could represent infection (exclude C difficile) or inflammation. Given suspicion of concurrent terminal ileal wall thickening, Crohn disease or ulcerative colitis should be considered. 2. Scattered pulmonary opacities, primarily favored to represent mild atypical infection of indeterminate acuity. Other pulmonary nodules are technically indeterminate. No follow-up needed if patient is low-risk. Non-contrast chest CT can be considered in 12 months if patient is high-risk. This recommendation follows the consensus statement: Guidelines for Management of Incidental Pulmonary Nodules Detected on CT Images: From the Fleischner Society 2017; Radiology 2017; 284:228-243. 3. Esophageal wall thickening is mild, suggesting esophagitis. Esophageal air fluid level suggests dysmotility or gastroesophageal reflux. 4. Emphysema 5. Subtle urothelial mucosal hyperenhancement and wall thickening,primarily in the right renal pelvis. Correlate with urinalysis to exclude ascending infection. Electronically Signed   By: Jeronimo Greaves M.D.   On: 10/15/2020 14:36   CT Abdomen Pelvis W Contrast  Result Date: 10/15/2020 CLINICAL DATA:  Nausea and vomiting. HIV. Persistent vomiting and diarrhea with abdominal pain for 1 month. Cough. Weakness. EXAM: CT CHEST, ABDOMEN, AND PELVIS WITH CONTRAST TECHNIQUE: Multidetector CT imaging of the chest, abdomen and pelvis was performed following the standard protocol during bolus administration of intravenous contrast. CONTRAST:  OMNIPAQUE IOHEXOL 300 MG/ML  SOLN COMPARISON:  Chest radiograph 08/07/2020. Abdominopelvic CT 02/20/2020. FINDINGS: CT CHEST FINDINGS Cardiovascular: Normal aortic caliber. Normal heart size, without pericardial effusion. No central pulmonary embolism, on this non-dedicated study. Mediastinum/Nodes: No mediastinal or hilar adenopathy. Subtle esophageal fluid level on 37/3. Mild esophageal wall thickening,  especially superiorly on 16/3. Lungs/Pleura: No pleural fluid. Mild centrilobular and paraseptal emphysema. Scattered bilateral pulmonary nodules. Many of these, especially in the right upper lobe, are somewhat ill-defined and favored to be infectious/inflammatory. Examples in the right upper lobe on 77 and 84 of series 4. Clustered nodules are also identified within the posterior left upper lobe on 62/4 and 70/4. Other more well-circumscribed nodules are indeterminate. Example in the right lower lobe at 4 mm on 130/4 and the right upper lobe at 4 mm on 66/4. Musculoskeletal: No acute osseous abnormality. CT ABDOMEN PELVIS FINDINGS Hepatobiliary: Normal liver. Normal gallbladder, without biliary ductal dilatation. Pancreas: Normal, without mass or ductal dilatation. Spleen: Normal in size, without focal abnormality. Adrenals/Urinary Tract: Normal adrenal glands. Subtle new uroepithelial mucosal thickening and mild hyperenhancement including within the renal pelvis on 84/3. No hydroureter or bladder abnormality. No hydronephrosis or renal mass. Stomach/Bowel: Normal stomach, without wall thickening. Mild wall thickening and mucosal hyperenhancement within the left side of the colon, rectum, sigmoid. The transverse and ascending colon are underdistended, but appear moderately thick walled with mucosal hyperenhancement. The terminal ileum is also underdistended but likely thickened including on 104/3. Normal small bowel. Vascular/Lymphatic: Normal aortic caliber. Prominent abdominal retroperitoneal nodes are similar to 02/13/2019 and likely reactive in the setting of HIV. Reproductive: Normal prostate. Other: No significant free fluid.  No free intraperitoneal air. Musculoskeletal: No acute osseous abnormality. IMPRESSION: 1. Diffuse colonic wall thickening with mild mucosal hyperenhancement. This could represent infection (exclude C difficile) or inflammation. Given suspicion of concurrent terminal ileal wall  thickening, Crohn disease or ulcerative colitis should be considered. 2. Scattered pulmonary opacities, primarily favored to represent mild atypical infection of indeterminate acuity. Other pulmonary nodules are technically indeterminate. No follow-up needed if patient is low-risk. Non-contrast chest CT can be considered in 12 months if patient is high-risk. This recommendation follows the consensus statement: Guidelines for  Management of Incidental Pulmonary Nodules Detected on CT Images: From the Fleischner Society 2017; Radiology 2017; 284:228-243. 3. Esophageal wall thickening is mild, suggesting esophagitis. Esophageal air fluid level suggests dysmotility or gastroesophageal reflux. 4. Emphysema 5. Subtle urothelial mucosal hyperenhancement and wall thickening,primarily in the right renal pelvis. Correlate with urinalysis to exclude ascending infection. Electronically Signed   By: Jeronimo Greaves M.D.   On: 10/15/2020 14:36     TODAY-DAY OF DISCHARGE:  Subjective:   Lesley Galentine today has no headache,no chest abdominal pain,no new weakness tingling or numbness, feels much better wants to go home today.   Objective:   Blood pressure 96/60, pulse 100, temperature 98.2 F (36.8 C), temperature source Oral, resp. rate 16, height 6\' 1"  (1.854 m), weight 69.2 kg, SpO2 99 %.  Intake/Output Summary (Last 24 hours) at 10/23/2020 1249 Last data filed at 10/23/2020 0700 Gross per 24 hour  Intake 1680 ml  Output 200 ml  Net 1480 ml   Filed Weights   10/15/20 1901 10/16/20 1342 10/20/20 0444  Weight: 66.9 kg 66.9 kg 69.2 kg    Exam: Awake Alert, Oriented *3, No new F.N deficits, Normal affect Victor.AT,PERRAL Supple Neck,No JVD, No cervical lymphadenopathy appriciated.  Symmetrical Chest wall movement, Good air movement bilaterally, CTAB RRR,No Gallops,Rubs or new Murmurs, No Parasternal Heave +ve B.Sounds, Abd Soft, Non tender, No organomegaly appriciated, No rebound -guarding or rigidity. No  Cyanosis, Clubbing or edema, No new Rash or bruise   PERTINENT RADIOLOGIC STUDIES: No results found.   PERTINENT LAB RESULTS: CBC: No results for input(s): WBC, HGB, HCT, PLT in the last 72 hours. CMET CMP     Component Value Date/Time   NA 134 (L) 10/20/2020 0150   NA 139 10/08/2020 1409   NA 141 09/27/2011 0740   K 4.5 10/20/2020 0150   K 3.2 (L) 09/27/2011 0740   CL 108 10/20/2020 0150   CL 104 09/27/2011 0740   CO2 22 10/20/2020 0150   CO2 28 09/27/2011 0740   GLUCOSE 92 10/20/2020 0150   GLUCOSE 99 09/27/2011 0740   BUN 16 10/20/2020 0150   BUN 10 10/08/2020 1409   BUN 12 09/27/2011 0740   CREATININE 0.83 10/20/2020 0150   CREATININE 1.05 08/25/2020 1008   CALCIUM 8.0 (L) 10/20/2020 0150   CALCIUM 8.1 (L) 09/27/2011 0740   PROT 8.5 (H) 10/15/2020 1036   PROT 7.9 10/08/2020 1409   PROT 8.7 (H) 09/27/2011 0040   ALBUMIN 2.6 (L) 10/15/2020 1036   ALBUMIN 3.1 (L) 10/08/2020 1409   ALBUMIN 3.3 (L) 09/27/2011 0040   AST 32 10/15/2020 1036   AST 32 09/27/2011 0040   ALT 16 10/15/2020 1036   ALT 20 09/27/2011 0040   ALKPHOS 60 10/15/2020 1036   ALKPHOS 52 09/27/2011 0040   BILITOT 0.7 10/15/2020 1036   BILITOT 0.3 10/08/2020 1409   BILITOT 0.7 09/27/2011 0040   GFRNONAA >60 10/20/2020 0150   GFRNONAA 90 08/25/2020 1008   GFRAA 105 08/25/2020 1008    GFR Estimated Creatinine Clearance: 119.3 mL/min (by C-G formula based on SCr of 0.83 mg/dL). No results for input(s): LIPASE, AMYLASE in the last 72 hours. No results for input(s): CKTOTAL, CKMB, CKMBINDEX, TROPONINI in the last 72 hours. Invalid input(s): POCBNP No results for input(s): DDIMER in the last 72 hours. No results for input(s): HGBA1C in the last 72 hours. No results for input(s): CHOL, HDL, LDLCALC, TRIG, CHOLHDL, LDLDIRECT in the last 72 hours. No results for input(s): TSH, T4TOTAL, T3FREE, THYROIDAB  in the last 72 hours.  Invalid input(s): FREET3 Recent Labs    10/22/20 0134  VITAMINB12 918*   FOLATE 4.4*   Coags: No results for input(s): INR in the last 72 hours.  Invalid input(s): PT Microbiology: Recent Results (from the past 240 hour(s))  Culture, blood (routine x 2)     Status: None   Collection Time: 10/15/20 12:20 PM   Specimen: BLOOD LEFT FOREARM  Result Value Ref Range Status   Specimen Description BLOOD LEFT FOREARM  Final   Special Requests   Final    BOTTLES DRAWN AEROBIC AND ANAEROBIC Blood Culture adequate volume   Culture   Final    NO GROWTH 5 DAYS Performed at [facility], [address] [place], [place] [address]    Report Status 10/20/2020 FINAL  Final  Gastrointestinal Panel by PCR , Stool     Status: Abnormal   Collection Time: 10/15/20  2:04 PM   Specimen: Stool  Result Value Ref Range Status   Campylobacter species NOT DETECTED NOT DETECTED Final   Plesimonas shigelloides NOT DETECTED NOT DETECTED Final   Salmonella species NOT DETECTED NOT DETECTED Final   Yersinia enterocolitica NOT DETECTED NOT DETECTED Final   Vibrio species NOT DETECTED NOT DETECTED Final   Vibrio cholerae NOT DETECTED NOT DETECTED Final   Enteroaggregative E coli (EAEC) NOT DETECTED NOT DETECTED Final   Enteropathogenic E coli (EPEC) NOT DETECTED NOT DETECTED Final   Enterotoxigenic E coli (ETEC) NOT DETECTED NOT DETECTED Final   Shiga like toxin producing E coli (STEC) NOT DETECTED NOT DETECTED Final   Shigella/Enteroinvasive E coli (EIEC) DETECTED (A) NOT DETECTED Final    Comment: RESULT CALLED TO, READ BACK BY AND VERIFIED WITH: Theodis AguasSHIKA TETTEH RN 0034 10/16/20 HNM    Cryptosporidium NOT DETECTED NOT DETECTED Final   Cyclospora cayetanensis NOT DETECTED NOT DETECTED Final   Entamoeba histolytica NOT DETECTED NOT DETECTED Final   Giardia lamblia NOT DETECTED NOT DETECTED Final   Adenovirus F40/41 NOT DETECTED NOT DETECTED Final   Astrovirus NOT DETECTED NOT DETECTED Final   Norovirus GI/GII NOT DETECTED NOT DETECTED Final   Rotavirus A NOT DETECTED NOT  DETECTED Final   Sapovirus (I, II, IV, and V) NOT DETECTED NOT DETECTED Final    Comment: Performed at [facility], [address] [place], [place] [address]  C Difficile Quick Screen w PCR reflex     Status: None   Collection Time: 10/15/20  2:04 PM   Specimen: Stool  Result Value Ref Range Status   C Diff antigen NEGATIVE NEGATIVE Final   C Diff toxin NEGATIVE NEGATIVE Final   C Diff interpretation No C. difficile detected.  Final    Comment: Performed at [facility], [address] [place], [place] [address]  Culture, blood (routine x 2)     Status: None   Collection Time: 10/15/20  2:30 PM   Specimen: BLOOD RIGHT ARM  Result Value Ref Range Status   Specimen Description BLOOD RIGHT ARM  Final   Special Requests   Final    BOTTLES DRAWN AEROBIC AND ANAEROBIC Blood Culture results may not be optimal due to an inadequate volume of blood received in culture bottles   Culture   Final    NO GROWTH 5 DAYS Performed at [facility], [address] [place], [place] [address]    Report Status 10/20/2020 FINAL  Final  Blastomyces Antigen     Status: None  Collection Time: 10/15/20  2:51 PM   Specimen: Urine  Result Value Ref Range Status   Blastomyces Antigen None Detected None Detected ng/mL Final    Comment: (NOTE) Results reported as ng/mL in 0.2 - 14.7 ng/mL range Results above the limit of detection but below 0.2 ng/mL are reported as 'Positive, Below the Limit of Quantification' Results above 14.7 ng/mL are reported as 'Positive, Above the Limit of Quantification'    Specimen Type URINE  Final    Comment: (NOTE) Performed At: [facility] [address] [place], [place] [id number] Phylis Bougie MD NW:[id number]   MRSA PCR Screening     Status: None   Collection Time: 10/15/20  7:10 PM   Specimen: Nasal Mucosa; Nasopharyngeal  Result Value Ref Range Status   MRSA by PCR NEGATIVE NEGATIVE Final    Comment:        The GeneXpert  MRSA Assay (FDA approved for NASAL specimens only), is one component of a comprehensive MRSA colonization surveillance program. It is not intended to diagnose MRSA infection nor to guide or monitor treatment for MRSA infections. Performed at [facility], [address], [place], [place] [address]   Acid Fast Smear (AFB)     Status: None   Collection Time: 10/19/20  6:45 PM   Specimen: Sputum  Result Value Ref Range Status   AFB Specimen Processing Concentration  Final   Acid Fast Smear Negative  Final    Comment: (NOTE) Performed At: [facility] [address] [place], [place] [id number] Jolene Schimke MD LK:[id number]    Source (AFB) SPUTUM  Final    Comment: Performed at [place] [facility], [address] [place], [place] [address]  Expectorated Sputum Assessment w Gram Stain, Rflx to Resp Cult     Status: None   Collection Time: 10/19/20  6:45 PM   Specimen: Sputum  Result Value Ref Range Status   Specimen Description SPUTUM  Final   Special Requests Immunocompromised  Final   Sputum evaluation   Final    THIS SPECIMEN IS ACCEPTABLE FOR SPUTUM CULTURE Performed at [facility], [address] [place], [place] [address]    Report Status 10/20/2020 FINAL  Final  Culture, Respiratory w Gram Stain     Status: None   Collection Time: 10/19/20  6:45 PM   Specimen: SPU  Result Value Ref Range Status   Specimen Description SPUTUM  Final   Special Requests Immunocompromised Reflexed from [id number]  Final   Gram Stain   Final    FEW WBC PRESENT,BOTH PMN AND MONONUCLEAR FEW GRAM POSITIVE RODS RARE GRAM POSITIVE COCCI IN PAIRS    Culture   Final    FEW Normal respiratory flora-no Staph aureus or Pseudomonas seen Performed at [facility], [address], [place], [place] [address]    Report Status 10/22/2020 FINAL  Final  Acid Fast Smear (AFB)     Status: None   Collection Time: 10/20/20  4:00 PM   Specimen: Sputum  Result Value Ref Range Status    AFB Specimen Processing Concentration  Final   Acid Fast Smear Negative  Final    Comment: (NOTE) Performed At: [facility] [address] [place], [place] [id number] Jolene Schimke MD YS:[id number]    Source (AFB) SPUTUM  Final    Comment: Performed at [facility], [address], [place], [place] [address]  Acid Fast Smear (AFB)     Status: None  Collection Time: 10/20/20  6:49 PM   Specimen: Vein; Blood  Result Value Ref Range Status   AFB Specimen Processing Direct Inoculation  Final    Comment: (NOTE) Performed At: [facility] [address] [place], [place] [id number] Jolene Schimke MD WU:[id number]    Acid Fast Smear QNSAFB  Final    Comment: (NOTE) Test not performed. AFB Smear not performed due to specimen source (blood) or insufficient specimen.    Source (AFB) WHOLE BLOOD  Final    Comment: Performed at [facility], [address], [place], [place] [address]    FURTHER DISCHARGE INSTRUCTIONS:  Get Medicines reviewed and adjusted: Please take all your medications with you for your next visit with your Primary MD  Laboratory/radiological data: Please request your Primary MD to go over all hospital tests and procedure/radiological results at the follow up, please ask your Primary MD to get all Hospital records sent to his/her office.  In some cases, they will be blood work, cultures and biopsy results pending at the time of your discharge. Please request that your primary care M.D. goes through all the records of your hospital data and follows up on these results.  Also Note the following: If you experience worsening of your admission symptoms, develop shortness of breath, life threatening emergency, suicidal or homicidal thoughts you must seek medical attention immediately by calling 911 or calling your MD immediately  if symptoms less severe.  You must read complete instructions/literature along with all the possible adverse  reactions/side effects for all the Medicines you take and that have been prescribed to you. Take any new Medicines after you have completely understood and accpet all the possible adverse reactions/side effects.   Do not drive when taking Pain medications or sleeping medications (Benzodaizepines)  Do not take more than prescribed Pain, Sleep and Anxiety Medications. It is not advisable to combine anxiety,sleep and pain medications without talking with your primary care practitioner  Special Instructions: If you have smoked or chewed Tobacco  in the last 2 yrs please stop smoking, stop any regular Alcohol  and or any Recreational drug use.  Wear Seat belts while driving.  Please note: You were cared for by a hospitalist during your hospital stay. Once you are discharged, your primary care physician will handle any further medical issues. Please note that NO REFILLS for any discharge medications will be authorized once you are discharged, as it is imperative that you return to your primary care physician (or establish a relationship with a primary care physician if you do not have one) for your post hospital discharge needs so that they can reassess your need for medications and monitor your lab values.  Total Time spent coordinating discharge including counseling, education and face to face time equals 35 minutes.  SignedJeoffrey Massed 10/23/2020 12:49 PM

## 2020-10-23 NOTE — Interval H&P Note (Signed)
History and Physical Interval Note:  10/23/2020 10:44 AM  Jacob Gutierrez  has presented today for surgery, with the diagnosis of colitis, diarrhea.  The various methods of treatment have been discussed with the patient and family. After consideration of risks, benefits and other options for treatment, the patient has consented to  Procedure(s): COLONOSCOPY WITH PROPOFOL (N/A) as a surgical intervention.  The patient's history has been reviewed, patient examined, no change in status, stable for surgery.  I have reviewed the patient's chart and labs.  Questions were answered to the patient's satisfaction.     Lynann Bologna

## 2020-10-24 ENCOUNTER — Other Ambulatory Visit (HOSPITAL_COMMUNITY): Payer: Self-pay

## 2020-10-24 MED FILL — Potassium Chloride Tab ER 10 mEq: ORAL | 30 days supply | Qty: 60 | Fill #0 | Status: AC

## 2020-10-25 ENCOUNTER — Encounter (HOSPITAL_COMMUNITY): Payer: Self-pay | Admitting: Gastroenterology

## 2020-10-25 NOTE — Anesthesia Postprocedure Evaluation (Signed)
Anesthesia Post Note  Patient: Jacob Gutierrez  Procedure(s) Performed: COLONOSCOPY WITH PROPOFOL (N/A ) BIOPSY     Patient location during evaluation: PACU Anesthesia Type: MAC Level of consciousness: awake and alert Pain management: pain level controlled Vital Signs Assessment: post-procedure vital signs reviewed and stable Respiratory status: spontaneous breathing, nonlabored ventilation, respiratory function stable and patient connected to nasal cannula oxygen Cardiovascular status: stable and blood pressure returned to baseline Postop Assessment: no apparent nausea or vomiting Anesthetic complications: no   No complications documented.  Last Vitals:  Vitals:   10/23/20 1150 10/23/20 1200  BP: (!) 92/57 96/60  Pulse: 83 100  Resp: 16 16  Temp:  36.8 C  SpO2: 100% 99%    Last Pain:  Vitals:   10/23/20 1200  TempSrc: Oral  PainSc: 0-No pain                 Dru Laurel

## 2020-10-26 LAB — SURGICAL PATHOLOGY

## 2020-10-27 ENCOUNTER — Ambulatory Visit: Payer: Medicaid Other | Admitting: Physician Assistant

## 2020-10-27 ENCOUNTER — Telehealth: Payer: Self-pay

## 2020-10-27 NOTE — Telephone Encounter (Signed)
Attempted to reach Jacob Gutierrez today to get him scheduled for a phone visit with the Managed Medicaid Team. I left my name and number for him to return my call. I will reach out again tomorrow if I have not heard back from him.

## 2020-10-31 LAB — CYTOMEGALOVIRUS (CMV) CULTURE - CMVCUL

## 2020-11-04 ENCOUNTER — Other Ambulatory Visit (HOSPITAL_COMMUNITY): Payer: Self-pay

## 2020-11-05 ENCOUNTER — Telehealth: Payer: Self-pay

## 2020-11-05 LAB — HIV GENOSURE(R) MG

## 2020-11-05 LAB — HIV-1 RNA, PCR (GRAPH) RFX/GENO EDI
HIV-1 RNA BY PCR: 978000 copies/mL
HIV-1 RNA Quant, Log: 5.99 log10copy/mL

## 2020-11-05 LAB — REFLEX TO GENOSURE(R) MG EDI: HIV GenoSure(R): 1

## 2020-11-05 NOTE — Telephone Encounter (Signed)
[facility] Patient Advocate Encounter  Patient's medications have been couriered to [facility] from [facility] and will be picked up 11/09/20.  Clearance Coots , CPhT Specialty Pharmacy Patient [facility] Phone: [phone] Fax:  [phone]

## 2020-11-09 ENCOUNTER — Telehealth: Payer: Self-pay

## 2020-11-09 ENCOUNTER — Other Ambulatory Visit: Payer: Self-pay | Admitting: *Deleted

## 2020-11-09 ENCOUNTER — Other Ambulatory Visit: Payer: Self-pay

## 2020-11-09 NOTE — Patient Outreach (Signed)
Care Coordination  11/09/2020  Jacob Gutierrez 07-22-1986 [id number]   Jacob Gutierrez was referred to the [facility] Managed Care High Risk team for assistance with care coordination and care management services. Care coordination/care management services as part of the Medicaid benefit was offered to the patient today. The patient declined assistance offered today.   Plan: The Medicaid Managed Care High Risk team is available at any time in the future to assist with care coordination/care management services upon referral.   Estanislado Emms RN, BSN [place]  [facility] RN Care Coordinator

## 2020-11-09 NOTE — Patient Instructions (Signed)
Visit Information  [name]  - as a part of your Medicaid benefit, you are eligible for care management and care coordination services at no cost or copay. I was unable to reach you by phone today but would be happy to help you with your health related needs. Please feel free to call me @ [phone].   A member of the Managed Medicaid care management team will reach out to you again over the next 7-14 days.   Estanislado Emms RN, BSN [place]  [facility]

## 2020-11-09 NOTE — Telephone Encounter (Signed)
I spoke with Jacob Gutierrez today to get him scheduled with our Managed Medicaid Team [facility] and he declined our services.

## 2020-11-09 NOTE — Patient Outreach (Signed)
Care Coordination  11/09/2020  Jacob Gutierrez 04-20-1986 [id number]    Successful outreach with [name] today, however, he was working and requested a call back in about 30 min. RNCM attempted to connect with patient. No answer. A detailed HIPAA compliant message was left, including direct contact information. RNCM will attempt to connect again over the next 14 days.  Estanislado Emms RN, BSN [place]  [facility]

## 2020-11-09 NOTE — Patient Instructions (Signed)
Thank you for taking time to speak with me today about care coordination and care management services available to you at no cost as part of your Medicaid benefit. These services are voluntary. Our team is available to provide assistance regarding your health care needs at any time. Please do not hesitate to reach out to me if we can be of service to you at any time in the future.   Tema Alire RN, BSN [place]  [facility] RN Care Coordinator  

## 2020-11-10 ENCOUNTER — Other Ambulatory Visit (HOSPITAL_COMMUNITY): Payer: Self-pay

## 2020-11-11 ENCOUNTER — Inpatient Hospital Stay: Payer: Medicaid Other | Admitting: Family

## 2020-11-17 ENCOUNTER — Other Ambulatory Visit (HOSPITAL_COMMUNITY): Payer: Self-pay

## 2020-11-17 MED FILL — Loperamide HCl Cap 2 MG: ORAL | 10 days supply | Qty: 30 | Fill #0 | Status: CN

## 2020-11-19 ENCOUNTER — Other Ambulatory Visit (HOSPITAL_COMMUNITY): Payer: Self-pay

## 2020-11-25 ENCOUNTER — Other Ambulatory Visit (HOSPITAL_COMMUNITY): Payer: Self-pay

## 2020-11-27 ENCOUNTER — Other Ambulatory Visit (HOSPITAL_COMMUNITY): Payer: Self-pay

## 2020-12-04 LAB — ACID FAST CULTURE WITH REFLEXED SENSITIVITIES (MYCOBACTERIA)
Acid Fast Culture: NEGATIVE
Acid Fast Culture: NEGATIVE

## 2020-12-05 LAB — ACID FAST CULTURE WITH REFLEXED SENSITIVITIES (MYCOBACTERIA): Acid Fast Culture: NEGATIVE

## 2020-12-21 LAB — ACID FAST CULTURE WITH REFLEXED SENSITIVITIES (MYCOBACTERIA)
Acid Fast Culture: NEGATIVE
Acid Fast Culture: NEGATIVE

## 2020-12-23 ENCOUNTER — Other Ambulatory Visit (HOSPITAL_COMMUNITY): Payer: Self-pay

## 2021-01-04 ENCOUNTER — Other Ambulatory Visit: Payer: Self-pay | Admitting: Family

## 2021-01-04 DIAGNOSIS — B20 Human immunodeficiency virus [HIV] disease: Secondary | ICD-10-CM

## 2021-01-13 ENCOUNTER — Other Ambulatory Visit (HOSPITAL_COMMUNITY): Payer: Self-pay

## 2021-01-15 ENCOUNTER — Other Ambulatory Visit (HOSPITAL_COMMUNITY): Payer: Self-pay

## 2021-01-15 MED FILL — Darunavir-Cobic-Emtricitab-Tenofov AF Tab 800-150-200-10 MG: ORAL | 30 days supply | Qty: 30 | Fill #0 | Status: AC

## 2021-01-15 MED FILL — Sulfamethoxazole-Trimethoprim Tab 800-160 MG: ORAL | 30 days supply | Qty: 30 | Fill #0 | Status: AC

## 2021-01-19 ENCOUNTER — Telehealth: Payer: Self-pay

## 2021-01-19 NOTE — Telephone Encounter (Signed)
[facility] Patient Advocate Encounter ° °Patient's medications have been couriered to [facility] from [facility] and will be picked up . ° °Tova Vater , CPhT °Specialty Pharmacy Patient Advocate °[facility] °Phone: [phone] °Fax:  [phone]  °

## 2021-02-09 ENCOUNTER — Emergency Department (HOSPITAL_COMMUNITY): Payer: Medicaid Other

## 2021-02-09 ENCOUNTER — Encounter (HOSPITAL_COMMUNITY): Payer: Self-pay | Admitting: *Deleted

## 2021-02-09 ENCOUNTER — Other Ambulatory Visit: Payer: Self-pay

## 2021-02-09 ENCOUNTER — Inpatient Hospital Stay (HOSPITAL_COMMUNITY)
Admission: EM | Admit: 2021-02-09 | Discharge: 2021-02-15 | DRG: 975 | Disposition: A | Discharge status: Home / Self Care | Payer: Medicaid Other | Attending: Internal Medicine | Admitting: Internal Medicine

## 2021-02-09 DIAGNOSIS — E861 Hypovolemia: Secondary | ICD-10-CM | POA: Diagnosis present

## 2021-02-09 DIAGNOSIS — R6520 Severe sepsis without septic shock: Secondary | ICD-10-CM | POA: Diagnosis present

## 2021-02-09 DIAGNOSIS — Z79899 Other long term (current) drug therapy: Secondary | ICD-10-CM | POA: Diagnosis not present

## 2021-02-09 DIAGNOSIS — N139 Obstructive and reflux uropathy, unspecified: Secondary | ICD-10-CM

## 2021-02-09 DIAGNOSIS — Z1612 Extended spectrum beta lactamase (ESBL) resistance: Secondary | ICD-10-CM | POA: Diagnosis present

## 2021-02-09 DIAGNOSIS — A419 Sepsis, unspecified organism: Secondary | ICD-10-CM | POA: Diagnosis not present

## 2021-02-09 DIAGNOSIS — A499 Bacterial infection, unspecified: Secondary | ICD-10-CM | POA: Diagnosis not present

## 2021-02-09 DIAGNOSIS — F17210 Nicotine dependence, cigarettes, uncomplicated: Secondary | ICD-10-CM | POA: Diagnosis present

## 2021-02-09 DIAGNOSIS — R6510 Systemic inflammatory response syndrome (SIRS) of non-infectious origin without acute organ dysfunction: Secondary | ICD-10-CM | POA: Diagnosis present

## 2021-02-09 DIAGNOSIS — Z8042 Family history of malignant neoplasm of prostate: Secondary | ICD-10-CM | POA: Diagnosis not present

## 2021-02-09 DIAGNOSIS — A4151 Sepsis due to Escherichia coli [E. coli]: Principal | ICD-10-CM | POA: Diagnosis present

## 2021-02-09 DIAGNOSIS — B370 Candidal stomatitis: Secondary | ICD-10-CM

## 2021-02-09 DIAGNOSIS — E860 Dehydration: Secondary | ICD-10-CM | POA: Diagnosis not present

## 2021-02-09 DIAGNOSIS — R1114 Bilious vomiting: Secondary | ICD-10-CM

## 2021-02-09 DIAGNOSIS — R112 Nausea with vomiting, unspecified: Secondary | ICD-10-CM | POA: Diagnosis not present

## 2021-02-09 DIAGNOSIS — F1520 Other stimulant dependence, uncomplicated: Secondary | ICD-10-CM | POA: Diagnosis not present

## 2021-02-09 DIAGNOSIS — Z881 Allergy status to other antibiotic agents status: Secondary | ICD-10-CM | POA: Diagnosis not present

## 2021-02-09 DIAGNOSIS — N19 Unspecified kidney failure: Secondary | ICD-10-CM

## 2021-02-09 DIAGNOSIS — Z9119 Patient's noncompliance with other medical treatment and regimen: Secondary | ICD-10-CM

## 2021-02-09 DIAGNOSIS — N179 Acute kidney failure, unspecified: Secondary | ICD-10-CM | POA: Diagnosis present

## 2021-02-09 DIAGNOSIS — B379 Candidiasis, unspecified: Secondary | ICD-10-CM | POA: Diagnosis not present

## 2021-02-09 DIAGNOSIS — R509 Fever, unspecified: Secondary | ICD-10-CM | POA: Diagnosis not present

## 2021-02-09 DIAGNOSIS — Z8249 Family history of ischemic heart disease and other diseases of the circulatory system: Secondary | ICD-10-CM | POA: Diagnosis not present

## 2021-02-09 DIAGNOSIS — Z792 Long term (current) use of antibiotics: Secondary | ICD-10-CM | POA: Diagnosis not present

## 2021-02-09 DIAGNOSIS — B20 Human immunodeficiency virus [HIV] disease: Secondary | ICD-10-CM | POA: Diagnosis present

## 2021-02-09 DIAGNOSIS — Z833 Family history of diabetes mellitus: Secondary | ICD-10-CM

## 2021-02-09 DIAGNOSIS — N12 Tubulo-interstitial nephritis, not specified as acute or chronic: Secondary | ICD-10-CM | POA: Diagnosis not present

## 2021-02-09 DIAGNOSIS — R195 Other fecal abnormalities: Secondary | ICD-10-CM | POA: Diagnosis not present

## 2021-02-09 DIAGNOSIS — Z9114 Patient's other noncompliance with medication regimen: Secondary | ICD-10-CM | POA: Diagnosis not present

## 2021-02-09 DIAGNOSIS — R5081 Fever presenting with conditions classified elsewhere: Secondary | ICD-10-CM

## 2021-02-09 DIAGNOSIS — Z20822 Contact with and (suspected) exposure to covid-19: Secondary | ICD-10-CM | POA: Diagnosis present

## 2021-02-09 DIAGNOSIS — Z832 Family history of diseases of the blood and blood-forming organs and certain disorders involving the immune mechanism: Secondary | ICD-10-CM

## 2021-02-09 DIAGNOSIS — E871 Hypo-osmolality and hyponatremia: Secondary | ICD-10-CM | POA: Diagnosis not present

## 2021-02-09 DIAGNOSIS — N136 Pyonephrosis: Secondary | ICD-10-CM | POA: Diagnosis present

## 2021-02-09 DIAGNOSIS — E876 Hypokalemia: Secondary | ICD-10-CM | POA: Diagnosis present

## 2021-02-09 DIAGNOSIS — Z8616 Personal history of COVID-19: Secondary | ICD-10-CM

## 2021-02-09 DIAGNOSIS — Z91199 Patient's noncompliance with other medical treatment and regimen due to unspecified reason: Secondary | ICD-10-CM

## 2021-02-09 DIAGNOSIS — R519 Headache, unspecified: Secondary | ICD-10-CM | POA: Diagnosis not present

## 2021-02-09 LAB — URINALYSIS, ROUTINE W REFLEX MICROSCOPIC
Bilirubin Urine: NEGATIVE
Glucose, UA: NEGATIVE mg/dL
Ketones, ur: NEGATIVE mg/dL
Nitrite: NEGATIVE
Protein, ur: 100 mg/dL — AB
Specific Gravity, Urine: 1.016 (ref 1.005–1.030)
WBC, UA: >50 WBC/hpf — ABNORMAL HIGH (ref 0–5)
pH: 6 (ref 5.0–8.0)

## 2021-02-09 LAB — CBC WITH DIFFERENTIAL/PLATELET
Basophils Absolute: 0 10*3/uL (ref 0.0–0.1)
Basophils Relative: 0 %
Eosinophils Absolute: 0 10*3/uL (ref 0.0–0.5)
Eosinophils Relative: 0 %
HCT: 35.6 % — ABNORMAL LOW (ref 39.0–52.0)
Hemoglobin: 11.3 g/dL — ABNORMAL LOW (ref 13.0–17.0)
Lymphocytes Relative: 11 %
Lymphs Abs: 0.9 10*3/uL (ref 0.7–4.0)
MCH: 30.1 pg (ref 26.0–34.0)
MCHC: 31.7 g/dL (ref 30.0–36.0)
MCV: 94.9 fL (ref 80.0–100.0)
Monocytes Absolute: 0.8 10*3/uL (ref 0.1–1.0)
Monocytes Relative: 10 %
Neutro Abs: 5.9 10*3/uL (ref 1.7–7.7)
Neutrophils Relative %: 78 %
Platelets: 199 10*3/uL (ref 150–400)
RBC: 3.75 MIL/uL — ABNORMAL LOW (ref 4.22–5.81)
RDW: 15.2 % (ref 11.5–15.5)
WBC: 7.6 10*3/uL (ref 4.0–10.5)
nRBC: 0 % (ref 0.0–0.2)

## 2021-02-09 LAB — RESP PANEL BY RT-PCR (FLU A&B, COVID) ARPGX2
Influenza A by PCR: NEGATIVE
Influenza B by PCR: NEGATIVE
SARS Coronavirus 2 by RT PCR: NEGATIVE

## 2021-02-09 LAB — COMPREHENSIVE METABOLIC PANEL
ALT: 12 U/L (ref 0–44)
AST: 33 U/L (ref 15–41)
Albumin: 2.9 g/dL — ABNORMAL LOW (ref 3.5–5.0)
Alkaline Phosphatase: 65 U/L (ref 38–126)
Anion gap: 12 (ref 5–15)
BUN: 20 mg/dL (ref 6–20)
CO2: 18 mmol/L — ABNORMAL LOW (ref 22–32)
Calcium: 8 mg/dL — ABNORMAL LOW (ref 8.9–10.3)
Chloride: 100 mmol/L (ref 98–111)
Creatinine, Ser: 1.49 mg/dL — ABNORMAL HIGH (ref 0.61–1.24)
GFR, Estimated: >60 mL/min (ref >60)
Glucose, Bld: 170 mg/dL — ABNORMAL HIGH (ref 70–99)
Potassium: 3.1 mmol/L — ABNORMAL LOW (ref 3.5–5.1)
Sodium: 130 mmol/L — ABNORMAL LOW (ref 135–145)
Total Bilirubin: 0.9 mg/dL (ref 0.3–1.2)
Total Protein: 9.3 g/dL — ABNORMAL HIGH (ref 6.5–8.1)

## 2021-02-09 LAB — ETHANOL: Alcohol, Ethyl (B): <10 mg/dL (ref <10)

## 2021-02-09 LAB — RAPID URINE DRUG SCREEN, HOSP PERFORMED
Amphetamines: POSITIVE — AB
Barbiturates: NOT DETECTED
Benzodiazepines: NOT DETECTED
Cocaine: NOT DETECTED
Opiates: NOT DETECTED
Tetrahydrocannabinol: POSITIVE — AB

## 2021-02-09 LAB — LACTIC ACID, PLASMA: Lactic Acid, Venous: 1.2 mmol/L (ref 0.5–1.9)

## 2021-02-09 LAB — LIPASE, BLOOD: Lipase: 33 U/L (ref 11–51)

## 2021-02-09 LAB — AMMONIA: Ammonia: 15 umol/L (ref 9–35)

## 2021-02-09 MED ORDER — ACETAMINOPHEN 325 MG PO TABS
650.0000 mg | ORAL_TABLET | Freq: Four times a day (QID) | ORAL | Status: DC | PRN
  Administered 2021-02-09: 650 mg via ORAL
  Filled 2021-02-09: qty 2

## 2021-02-09 MED ORDER — SODIUM CHLORIDE 0.9 % IV SOLN
2.0000 g | Freq: Three times a day (TID) | INTRAVENOUS | Status: DC
  Administered 2021-02-09 – 2021-02-10 (×2): 2 g via INTRAVENOUS
  Filled 2021-02-09 (×2): qty 2

## 2021-02-09 MED ORDER — ONDANSETRON HCL 4 MG/2ML IJ SOLN
4.0000 mg | Freq: Four times a day (QID) | INTRAMUSCULAR | Status: DC | PRN
  Administered 2021-02-10 – 2021-02-11 (×4): 4 mg via INTRAVENOUS
  Filled 2021-02-09 (×4): qty 2

## 2021-02-09 MED ORDER — LINEZOLID 600 MG/300ML IV SOLN
600.0000 mg | Freq: Two times a day (BID) | INTRAVENOUS | Status: DC
  Administered 2021-02-09 – 2021-02-11 (×4): 600 mg via INTRAVENOUS
  Filled 2021-02-09 (×4): qty 300

## 2021-02-09 MED ORDER — ACETAMINOPHEN 325 MG PO TABS
650.0000 mg | ORAL_TABLET | Freq: Once | ORAL | Status: AC | PRN
  Administered 2021-02-09: 650 mg via ORAL
  Filled 2021-02-09: qty 2

## 2021-02-09 MED ORDER — ONDANSETRON 4 MG PO TBDP
4.0000 mg | ORAL_TABLET | Freq: Once | ORAL | Status: AC
  Administered 2021-02-09: 4 mg via ORAL
  Filled 2021-02-09: qty 1

## 2021-02-09 MED ORDER — ONDANSETRON HCL 4 MG/2ML IJ SOLN
4.0000 mg | Freq: Once | INTRAMUSCULAR | Status: AC
  Administered 2021-02-09: 4 mg via INTRAVENOUS
  Filled 2021-02-09: qty 2

## 2021-02-09 MED ORDER — SODIUM CHLORIDE 0.9 % IV BOLUS
1000.0000 mL | Freq: Once | INTRAVENOUS | Status: AC
  Administered 2021-02-09: 1000 mL via INTRAVENOUS

## 2021-02-09 NOTE — Progress Notes (Signed)
Pharmacy Antibiotic Note  Jacob Gutierrez is a 35 y.o. male admitted on 02/09/2021 presenting with chills, fatigue, N/V.  Hx HIV with last viral load elevated and CD4 <35.  Pharmacy has been consulted for cefepime dosing.  Zyvox per MD  Plan: Cefepime 2g IV q 8 h Monitor renal function, Cx and clinical progression to narrow    Temp (24hrs), Avg:102.9 F (39.4 C), Min:102.9 F (39.4 C), Max:102.9 F (39.4 C)  Recent Labs  Lab 02/09/21 1241  WBC 7.6  CREATININE 1.49*    CrCl cannot be calculated (Unknown ideal weight.).    Allergies  Allergen Reactions   Vancomycin Anaphylaxis, Itching, Swelling and Other (See Comments)    Angioedema and "everything swells"   Amoxicillin Other (See Comments)    From childhood: "I had a reaction when i was little." (??)    Daylene Posey, PharmD Clinical Pharmacist ED Pharmacist Phone # [phone] 02/09/2021 7:23 PM

## 2021-02-09 NOTE — ED Notes (Signed)
Pt refused to answer questions. He just laid in bed and said mmm.

## 2021-02-09 NOTE — ED Notes (Signed)
Pt not responding for vitals  

## 2021-02-09 NOTE — ED Triage Notes (Signed)
Pt arrived by [facility], reports onset yesterday headache and not feeling well that progressed to n/v. Hx of migraines.

## 2021-02-09 NOTE — ED Provider Notes (Signed)
7:16 PM Care assumed from [facility].  At time of transfer care, patient is awaiting other laboratory testing but we are already starting broad-spectrum antibiotics with concern for serious infection in this patient with a documented history of AIDS.  Patient has fevers, chills with a temperature of 102.9 here, tachycardia, and severe headache.  Work-up thus far showed a negative COVID and flu testing, has not yet provided a urine, but his chest x-ray did not show pneumonia but only chronic interstitial prominence.  Patient reportedly already refused a lumbar puncture as he has had this done in the past and does not want that today.  Clinically we are concerned about a meningitis of some kind.  We spoke with pharmacy who recommended linezolid and Maxipime.  When rest of work-up is completed, will call for admission for further management.   CT returned showing no acute process.  Chest x-ray did not show pneumonia.  Urinalysis does show leukocytes and bacteria, could possibly be the source of some symptoms however patient will be admitted for presumptive meningitis while refusing lumbar puncture in the setting of AIDS.  Clinical Impression: 1. Fever in other diseases     Disposition: Admit  This note was prepared with assistance of Dragon voice recognition software. Occasional wrong-word or sound-a-like substitutions may have occurred due to the inherent limitations of voice recognition software.     Dominga Mcduffie, Canary Brim, MD 02/09/21 2249

## 2021-02-09 NOTE — ED Provider Notes (Signed)
Emergency Medicine Provider Triage Evaluation Note  Jacob Gutierrez , a 35 y.o. male  was evaluated in triage.  Pt complains of 4 days of chills, fatigue, anorexia, frontal headache, now with nausea and vomiting.  Denies any blurry or double vision, also has some cough and congestion.  Patient is not vaccinated for COVID-19.  Patient is homeless currently having any onset of strep.  States he has not had any sneezing for several days.  Family in triage.  Does have history of HIV on Symtuza daily, endorses compliance.  Review of Systems  Positive: Nausea, vomiting, cough, congestion, chills, fevers Negative: Chest pain, palpitations, blurry, double vision  Physical Exam  BP 121/79   Pulse (!) 122   Temp (!) 102.9 F (39.4 C) (Oral)   Resp 14   SpO2 98%  Gen:   Awake, vomiting in triage Resp:  Normal effort  MSK:   Moves extremities without difficulty  Other:  Coughing, clear rhinorrhea.  Abdomen soft nondistended, nontender.  RRR no M/R/G.  Medical Decision Making  Medically screening exam initiated at 12:41 PM.  Appropriate orders placed.  NIHAR KLUS was informed that the remainder of the evaluation will be completed by another provider, this initial triage assessment does not replace that evaluation, and the importance of remaining in the ED until their evaluation is complete.  This chart was dictated using voice recognition software, Dragon. Despite the best efforts of this provider to proofread and correct errors, errors may still occur which can change documentation meaning.    Sherrilee Gilles 02/09/21 1243    Bethann Berkshire, MD 02/11/21 1004

## 2021-02-09 NOTE — ED Notes (Signed)
Unable to give pt tylenol right now because he's actively throwing up. Doctor messaged.

## 2021-02-09 NOTE — ED Provider Notes (Addendum)
[facility] EMERGENCY DEPARTMENT Provider Note   CSN: [id number] Arrival date & time: 02/09/21  1229     History Chief Complaint  Patient presents with   Headache   Emesis   Fever    Jacob Gutierrez is a 35 y.o. male.  Pt complains of a headache, fever and vomitting.  Pt is followed by ID clinic for HIV.  Pt reports he has missed some dosages of his medications   The history is provided by the patient. No language interpreter was used.  Headache Pain location:  Generalized Radiates to:  Does not radiate Onset quality:  Gradual Timing:  Constant Progression:  Worsening Chronicity:  New Similar to prior headaches: no   Relieved by:  Nothing Associated symptoms: fever and vomiting   Associated symptoms: no cough   Emesis Associated symptoms: fever and headaches   Associated symptoms: no chills and no cough   Fever Associated symptoms: headaches, rhinorrhea and vomiting   Associated symptoms: no chest pain, no chills, no cough and no rash       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 [facility])    H/O coccidioidomycosis    pulmonary    HIV disease (HCC)    Past history of allergy to penicillin-type antibiotic 07-03-2012   desensitization   PNA (pneumonia)    Shigella gastroenteritis    Syphilis    history /treated     Patient Active Problem List   Diagnosis Date Noted   Hypokalemia 10/15/2020   Nausea, vomiting, and diarrhea 10/15/2020   Colitis 10/15/2020   Dysphagia 10/15/2020   COVID-19 virus infection 08/25/2020   Dark urine 08/25/2020   Healthcare maintenance 07/23/2020   Acute pyelonephritis 02/13/2019   Prostatitis 10/05/2018   Leukopenia 09/20/2018   Thrombocytopenia (HCC) 09/20/2018   Hyponatremia 09/20/2018   Avoidance coping 09/07/2018   Headache 04/27/2018    Condyloma 04/24/2018   Protein-calorie malnutrition, severe 12/12/2017   Personal history of MRSA (methicillin resistant Staphylococcus aureus) 12/09/2017   History of ESBL E. coli infection 12/09/2017   Diarrhea 03/05/2017   AIDS (acquired immune deficiency syndrome) (HCC)    Homelessness    Dysplasia of anus 01/02/2014   Tobacco use disorder 01/02/2014   Human immunodeficiency virus (HIV) disease (HCC) 01/01/2014    Past Surgical History:  Procedure Laterality Date   BIOPSY  10/16/2020   Procedure: BIOPSY;  Surgeon: Lemar Lofty., MD;  Location: [facility] ENDOSCOPY;  Service: Gastroenterology;;   BIOPSY  10/23/2020   Procedure: BIOPSY;  Surgeon: Lynann Bologna, MD;  Location: [facility] ENDOSCOPY;  Service: Endoscopy;;   COLONOSCOPY WITH PROPOFOL N/A 10/23/2020   Procedure: COLONOSCOPY WITH PROPOFOL;  Surgeon: Lynann Bologna, MD;  Location: [facility] ENDOSCOPY;  Service: Endoscopy;  Laterality: N/A;   ESOPHAGOGASTRODUODENOSCOPY (EGD) WITH PROPOFOL N/A 10/16/2020   Procedure: ESOPHAGOGASTRODUODENOSCOPY (EGD) WITH PROPOFOL;  Surgeon: Meridee Score Netty Starring., MD;  Location: [facility] ENDOSCOPY;  Service: Gastroenterology;  Laterality: N/A;   TRANSURETHRAL RESECTION OF PROSTATE N/A 12/09/2017   Procedure: TRANSURETHRAL RESECTION OF THE PROSTATE (TURP);  Surgeon: Crist Fat, MD;  Location: [facility] ORS;  Service: Urology;  Laterality: N/A;       Family History  Problem Relation Age of Onset   Hypertension Mother    Lupus Maternal Grandmother    Diabetes Maternal Grandmother    Cancer Paternal Grandfather  unknown    Prostate cancer Father    Diabetes Father    Diabetes Maternal Grandfather    Colon cancer Neg Hx    Stomach cancer Neg Hx    Esophageal cancer Neg Hx    Pancreatic cancer Neg Hx     Social History   Tobacco Use   Smoking status: Every Day    Packs/day: 0.30    Types: Cigarettes   Smokeless tobacco: Never   Tobacco comments:    cutting back  Vaping Use   Vaping Use:  Never used  Substance Use Topics   Alcohol use: Not Currently    Alcohol/week: 1.0 standard drink    Types: 1 Standard drinks or equivalent per week    Comment: whiskey occasionally    Drug use: Not Currently    Frequency: 1.0 times per week    Types: Marijuana    Home Medications Prior to Admission medications   Medication Sig Start Date End Date Taking? Authorizing Provider  azithromycin (ZITHROMAX) 500 MG tablet TAKE 1 TABLET (500 MG TOTAL) BY MOUTH DAILY FOR 7 DAYS. 09/25/20 09/25/21  Unk LightningLemmon, Jennifer Lynne, PA  Darunavir-Cobicisctat-Emtricitabine-Tenofovir Alafenamide [facility](SYMTUZA) 800-150-200-10 MG TABS Take 1 tablet by mouth daily with breakfast. 10/23/20   Ghimire, Werner LeanShanker M, MD  Darunavir-Cobicisctat-Emtricitabine-Tenofovir Alafenamide (SYMTUZA) 800-150-200-10 MG TABS TAKE 1 TABLET BY MOUTH DAILY WITH BREAKFAST. 10/23/20 10/23/21  Ghimire, Werner LeanShanker M, MD  ferrous sulfate 324 (65 Fe) MG TBEC Take 1 tablet (325 mg total) by mouth daily after breakfast. 10/23/20   Ghimire, Werner LeanShanker M, MD  ferrous sulfate 325 (65 FE) MG tablet TAKE 1 TABLET (325 MG TOTAL) BY MOUTH DAILY AFTER BREAKFAST. 10/23/20 10/23/21  Ghimire, Werner LeanShanker M, MD  fluconazole (DIFLUCAN) 200 MG tablet TAKE 2 TABLETS (400 MG TOTAL) BY MOUTH DAILY FOR 13 DAYS. 10/23/20 10/23/21  Ghimire, Werner LeanShanker M, MD  lipase/protease/amylase (CREON) 36000 UNITS CPEP capsule Take 2 capsules (72,000 units) by mouth WITH every meal, take 1 capsule (36,000 units) by mouth with every snack. 10/23/20   Ghimire, Werner LeanShanker M, MD  lipase/protease/amylase (CREON) 36000 UNITS CPEP capsule TAKE 2 CAPSULES (72,000 UNITS) BY MOUTH WITH EVERY MEAL, TAKE 1 CAPSULE (36,000 UNITS) BY MOUTH WITH EVERY SNACK. 10/23/20 10/23/21  Ghimire, Werner LeanShanker M, MD  lipase/protease/amylase (CREON) 36000 UNITS CPEP capsule TAKE 2 CAPSULES (72,000 UNITS) BY MOUTH WITH EVERY MEAL, TAKE 1 CAPSULE (36,000 UNITS) BY MOUTH WITH EVERY SNACK. 09/25/20 09/25/21  Unk LightningLemmon, Jennifer Lynne, PA  loperamide (IMODIUM A-D) 2 MG  tablet Take 1 tablet (2 mg total) by mouth every 8 (eight) hours. Patient taking differently: Take 2 mg by mouth every 8 (eight) hours as needed for diarrhea or loose stools. 09/18/20   Unk LightningLemmon, Jennifer Lynne, PA  loperamide (IMODIUM) 2 MG capsule TAKE 1 TABLET (2 MG TOTAL) BY MOUTH EVERY 8 (EIGHT) HOURS. 09/18/20 09/18/21  Unk LightningLemmon, Jennifer Lynne, PA  metoCLOPramide (REGLAN) 10 MG tablet TAKE 1 TABLET (10 MG TOTAL) BY MOUTH EVERY SIX HOURS. 09/02/20 09/02/21  Mare FerrariBelaya, Maria A, PA-C  nitrofurantoin, macrocrystal-monohydrate, (MACROBID) 100 MG capsule TAKE 1 CAPSULE BY MOUTH 2 TIMES DAILY FOR 7 DAYS 08/27/20 08/27/21  Veryl Speakalone, Gregory D, FNP  omeprazole (PRILOSEC) 20 MG capsule TAKE 1 CAPSULE (20 MG TOTAL) BY MOUTH 2 (TWO) TIMES DAILY BEFORE A MEAL. TAKE ONE CAPSULE 30-60 MINUTES BEFORE BREAKFAST AND DINNER 09/18/20 09/18/21  Unk LightningLemmon, Jennifer Lynne, PA  pantoprazole (PROTONIX) 40 MG tablet Take 1 tablet (40 mg total) by mouth 2 (two) times daily. 10/23/20   Ghimire, Werner LeanShanker M, MD  pantoprazole (PROTONIX) 40 MG tablet TAKE 1 TABLET (40 MG TOTAL) BY MOUTH 2 (TWO) TIMES DAILY. 10/23/20 10/23/21  Ghimire, Werner Lean, MD  potassium chloride (KLOR-CON) 10 MEQ tablet TAKE 2 TABLETS BY MOUTH DAILY. 10/14/20 10/14/21  Anders Simmonds, PA-C  potassium chloride SA (KLOR-CON) 20 MEQ tablet TAKE 1 TABLET (20 MEQ TOTAL) BY MOUTH TWO TIMES DAILY. 09/02/20 09/02/21  Trudee Grip A, PA-C  sulfamethoxazole-trimethoprim (BACTRIM DS) 800-160 MG tablet TAKE 1 TABLET BY MOUTH DAILY. 10/23/20 10/23/21  Ghimire, Werner Lean, MD  sulfamethoxazole-trimethoprim (BACTRIM) 400-80 MG tablet Take 1 tablet by mouth daily. 10/23/20   Ghimire, Werner Lean, MD    Allergies    Vancomycin and Amoxicillin  Review of Systems   Review of Systems  Constitutional:  Positive for fever. Negative for chills.  HENT:  Positive for rhinorrhea.   Respiratory:  Negative for cough.   Cardiovascular:  Negative for chest pain.  Gastrointestinal:  Positive for vomiting.  Skin:   Negative for color change and rash.  Neurological:  Positive for headaches.  All other systems reviewed and are negative.  Physical Exam Updated Vital Signs BP 103/68   Pulse 100   Temp (!) 102.9 F (39.4 C) (Oral)   Resp 19   SpO2 98%   Physical Exam Vitals and nursing note reviewed.  Constitutional:      Appearance: He is well-developed.  HENT:     Head: Normocephalic and atraumatic.  Eyes:     Conjunctiva/sclera: Conjunctivae normal.  Cardiovascular:     Rate and Rhythm: Normal rate and regular rhythm.     Heart sounds: No murmur heard. Pulmonary:     Effort: Pulmonary effort is normal. No respiratory distress.     Breath sounds: Normal breath sounds.  Abdominal:     Palpations: Abdomen is soft.     Tenderness: There is no abdominal tenderness.  Musculoskeletal:     Cervical back: Neck supple.  Skin:    General: Skin is warm and dry.  Neurological:     Mental Status: He is alert.    ED Results / Procedures / Treatments   Labs (all labs ordered are listed, but only abnormal results are displayed) Labs Reviewed  COMPREHENSIVE METABOLIC PANEL - Abnormal; Notable for the following components:      Result Value   Sodium 130 (*)    Potassium 3.1 (*)    CO2 18 (*)    Glucose, Bld 170 (*)    Creatinine, Ser 1.49 (*)    Calcium 8.0 (*)    Total Protein 9.3 (*)    Albumin 2.9 (*)    All other components within normal limits  CBC WITH DIFFERENTIAL/PLATELET - Abnormal; Notable for the following components:   RBC 3.75 (*)    Hemoglobin 11.3 (*)    HCT 35.6 (*)    All other components within normal limits  RESP PANEL BY RT-PCR (FLU A&B, COVID) ARPGX2  CULTURE, BLOOD (ROUTINE X 2)  CULTURE, BLOOD (ROUTINE X 2)  LIPASE, BLOOD  LACTIC ACID, PLASMA  ETHANOL  RAPID URINE DRUG SCREEN, HOSP PERFORMED  URINALYSIS, ROUTINE W REFLEX MICROSCOPIC  AMMONIA    EKG None  Radiology DG Chest 2 View  Result Date: 02/09/2021 CLINICAL DATA:  Cough. EXAM: CHEST - 2 VIEW  COMPARISON:  CT 10/15/2020.  Chest x-ray 08/07/2020, 09/19/2018. FINDINGS: Mediastinum hilar structures normal. Heart size normal. Stable mild chronic interstitial prominence. No acute alveolar infiltrate. No pleural effusion or pneumothorax. IMPRESSION: Stable chronic bilateral interstitial prominence. No  acute cardiopulmonary disease identified. Electronically Signed   By: Maisie Fus  Register   On: 02/09/2021 13:22    Procedures Procedures   Medications Ordered in ED Medications  acetaminophen (TYLENOL) tablet 650 mg (has no administration in time range)  acetaminophen (TYLENOL) tablet 650 mg (650 mg Oral Given 02/09/21 1249)  ondansetron (ZOFRAN-ODT) disintegrating tablet 4 mg (4 mg Oral Given 02/09/21 1258)  sodium chloride 0.9 % bolus 1,000 mL (1,000 mLs Intravenous New Bag/Given 02/09/21 1836)  ondansetron (ZOFRAN) injection 4 mg (4 mg Intravenous Given 02/09/21 1856)    ED Course  I have reviewed the triage vital signs and the nursing notes.  Pertinent labs & imaging results that were available during my care of the patient were reviewed by me and considered in my medical decision making (see chart for details).    MDM Rules/Calculators/A&P                           MDM:  Iv fluids ordered, tylenol and zofran.  Ct and labs pending.  Pt refuses LP.  He has had an LP in the past and refuses LP.   Final Clinical Impression(s) / ED Diagnoses Final diagnoses:  Fever in other diseases    Rx / DC Orders ED Discharge Orders     None        Osie Cheeks 02/09/21 3532    Tegeler, Canary Brim, MD 02/09/21 2042    Elson Areas, PA-C 03/07/21 1510    Tegeler, Canary Brim, MD 03/08/21 [phone]

## 2021-02-10 ENCOUNTER — Inpatient Hospital Stay (HOSPITAL_COMMUNITY): Payer: Medicaid Other

## 2021-02-10 ENCOUNTER — Encounter (HOSPITAL_COMMUNITY): Payer: Self-pay | Admitting: Family Medicine

## 2021-02-10 DIAGNOSIS — N179 Acute kidney failure, unspecified: Secondary | ICD-10-CM | POA: Diagnosis not present

## 2021-02-10 DIAGNOSIS — R1114 Bilious vomiting: Secondary | ICD-10-CM

## 2021-02-10 DIAGNOSIS — R6520 Severe sepsis without septic shock: Secondary | ICD-10-CM

## 2021-02-10 DIAGNOSIS — R6510 Systemic inflammatory response syndrome (SIRS) of non-infectious origin without acute organ dysfunction: Secondary | ICD-10-CM

## 2021-02-10 DIAGNOSIS — R509 Fever, unspecified: Secondary | ICD-10-CM | POA: Diagnosis not present

## 2021-02-10 DIAGNOSIS — B20 Human immunodeficiency virus [HIV] disease: Secondary | ICD-10-CM | POA: Diagnosis not present

## 2021-02-10 DIAGNOSIS — R195 Other fecal abnormalities: Secondary | ICD-10-CM

## 2021-02-10 DIAGNOSIS — A4151 Sepsis due to Escherichia coli [E. coli]: Principal | ICD-10-CM

## 2021-02-10 DIAGNOSIS — R519 Headache, unspecified: Secondary | ICD-10-CM

## 2021-02-10 DIAGNOSIS — R112 Nausea with vomiting, unspecified: Secondary | ICD-10-CM

## 2021-02-10 DIAGNOSIS — R1110 Vomiting, unspecified: Secondary | ICD-10-CM | POA: Insufficient documentation

## 2021-02-10 DIAGNOSIS — F1520 Other stimulant dependence, uncomplicated: Secondary | ICD-10-CM

## 2021-02-10 DIAGNOSIS — E871 Hypo-osmolality and hyponatremia: Secondary | ICD-10-CM

## 2021-02-10 LAB — CBC
HCT: 35.1 % — ABNORMAL LOW (ref 39.0–52.0)
Hemoglobin: 11.2 g/dL — ABNORMAL LOW (ref 13.0–17.0)
MCH: 30.4 pg (ref 26.0–34.0)
MCHC: 31.9 g/dL (ref 30.0–36.0)
MCV: 95.4 fL (ref 80.0–100.0)
Platelets: 161 10*3/uL (ref 150–400)
RBC: 3.68 MIL/uL — ABNORMAL LOW (ref 4.22–5.81)
RDW: 15.3 % (ref 11.5–15.5)
WBC: 6.2 10*3/uL (ref 4.0–10.5)
nRBC: 0 % (ref 0.0–0.2)

## 2021-02-10 LAB — CRYPTOCOCCAL ANTIGEN: Crypto Ag: NEGATIVE

## 2021-02-10 LAB — BASIC METABOLIC PANEL
Anion gap: 10 (ref 5–15)
BUN: 17 mg/dL (ref 6–20)
CO2: 19 mmol/L — ABNORMAL LOW (ref 22–32)
Calcium: 7.9 mg/dL — ABNORMAL LOW (ref 8.9–10.3)
Chloride: 100 mmol/L (ref 98–111)
Creatinine, Ser: 1.23 mg/dL (ref 0.61–1.24)
GFR, Estimated: >60 mL/min (ref >60)
Glucose, Bld: 111 mg/dL — ABNORMAL HIGH (ref 70–99)
Potassium: 3.5 mmol/L (ref 3.5–5.1)
Sodium: 129 mmol/L — ABNORMAL LOW (ref 135–145)

## 2021-02-10 LAB — MAGNESIUM: Magnesium: 1.9 mg/dL (ref 1.7–2.4)

## 2021-02-10 LAB — LACTATE DEHYDROGENASE: LDH: 519 U/L — ABNORMAL HIGH (ref 98–192)

## 2021-02-10 LAB — TSH: TSH: 1.545 u[IU]/mL (ref 0.350–4.500)

## 2021-02-10 MED ORDER — POTASSIUM CHLORIDE CRYS ER 20 MEQ PO TBCR
40.0000 meq | EXTENDED_RELEASE_TABLET | Freq: Once | ORAL | Status: AC
  Administered 2021-02-10: 40 meq via ORAL
  Filled 2021-02-10: qty 2

## 2021-02-10 MED ORDER — SODIUM CHLORIDE 0.9 % IV SOLN
2.0000 g | INTRAVENOUS | Status: DC
  Administered 2021-02-10: 2 g via INTRAVENOUS
  Filled 2021-02-10 (×2): qty 20

## 2021-02-10 MED ORDER — GADOBUTROL 1 MMOL/ML IV SOLN
7.5000 mL | Freq: Once | INTRAVENOUS | Status: AC | PRN
  Administered 2021-02-10: 7.5 mL via INTRAVENOUS

## 2021-02-10 MED ORDER — SODIUM CHLORIDE 0.9 % IV SOLN: INTRAVENOUS | Status: DC

## 2021-02-10 MED ORDER — SULFAMETHOXAZOLE-TRIMETHOPRIM 800-160 MG PO TABS
1.0000 | ORAL_TABLET | Freq: Every day | ORAL | Status: DC
  Administered 2021-02-10: 1 via ORAL
  Filled 2021-02-10: qty 1

## 2021-02-10 MED ORDER — SODIUM CHLORIDE 0.9 % IV SOLN
6.2500 mg | Freq: Four times a day (QID) | INTRAVENOUS | Status: DC | PRN
  Administered 2021-02-10 – 2021-02-11 (×3): 6.25 mg via INTRAVENOUS
  Filled 2021-02-10 (×3): qty 0.25

## 2021-02-10 MED ORDER — ACETAMINOPHEN 325 MG PO TABS
650.0000 mg | ORAL_TABLET | Freq: Four times a day (QID) | ORAL | Status: DC | PRN
  Administered 2021-02-10 (×2): 650 mg via ORAL
  Filled 2021-02-10 (×2): qty 2

## 2021-02-10 MED ORDER — FENTANYL CITRATE (PF) 100 MCG/2ML IJ SOLN
50.0000 ug | INTRAMUSCULAR | Status: AC | PRN
  Administered 2021-02-10: 50 ug via INTRAVENOUS
  Filled 2021-02-10: qty 2

## 2021-02-10 MED ORDER — SODIUM CHLORIDE 0.9 % IV BOLUS
1000.0000 mL | Freq: Once | INTRAVENOUS | Status: AC
  Administered 2021-02-10: 1000 mL via INTRAVENOUS

## 2021-02-10 MED ORDER — DARUN-COBIC-EMTRICIT-TENOFAF 800-150-200-10 MG PO TABS
1.0000 | ORAL_TABLET | Freq: Every day | ORAL | Status: DC
  Administered 2021-02-10 – 2021-02-15 (×5): 1 via ORAL
  Filled 2021-02-10 (×6): qty 1

## 2021-02-10 MED ORDER — KETOROLAC TROMETHAMINE 15 MG/ML IJ SOLN
15.0000 mg | Freq: Four times a day (QID) | INTRAMUSCULAR | Status: DC | PRN
  Administered 2021-02-10 – 2021-02-11 (×3): 15 mg via INTRAVENOUS
  Filled 2021-02-10 (×3): qty 1

## 2021-02-10 MED ORDER — ENOXAPARIN SODIUM 40 MG/0.4ML IJ SOSY
40.0000 mg | PREFILLED_SYRINGE | INTRAMUSCULAR | Status: DC
  Administered 2021-02-10 – 2021-02-12 (×3): 40 mg via SUBCUTANEOUS
  Filled 2021-02-10 (×3): qty 0.4

## 2021-02-10 MED ORDER — POTASSIUM CHLORIDE IN NACL 20-0.9 MEQ/L-% IV SOLN
INTRAVENOUS | Status: AC
  Filled 2021-02-10 (×2): qty 1000

## 2021-02-10 NOTE — ED Notes (Signed)
Attempted report x 2 

## 2021-02-10 NOTE — ED Notes (Signed)
Pt sustain ST at 115-120. Pt temp 100. MD Opyd notified. RN to give bolus.

## 2021-02-10 NOTE — Consult Note (Addendum)
[facility]    Date of Admission:  02/09/2021     Total days of antibiotics 1   Linezolid   Cefepime         Reason for Consult: Fever, N/V Headache, AIDS+    Referring Provider: [facility] Primary Care Provider: Patient, No Pcp Per (Inactive)     Assessment: Jacob Gutierrez is a 35 y.o. male with uncontrolled HIV and AIDS here for fever, nausea/vomiting and headaches.  He states his adherence is not great but better for Symtuza. Not clear if he is taking his other prescribed / prophylactic medications. Will check VL and CD4 count now including genotype as he may be a good candidate for long acting injectable HIV treatment if he does not have significant drug resistance.   Headaches and Fever = He is refusing LP; on exam there is no findings c/w meningismus and he is not encephalopathic. His serum cryptococcal Ag is negative, reassuringly, which should be pretty predictive to rule out invasive CNS disease here.  Differential remains quite broad given his immunocompromised state.  Probably OK to defer LP with his exam for now and check MRI of the brain w/ and w/o contrast. Toxo IgM/IgG serum pending. Will check RPR as well with h/o headaches, though not very high on differential given associated nausea/vomiting (last titer 1:2 and has been serofast about here). I suspect we may be able to stop linezolid if no blood culture growth in 48 hours given no concern for PNA or skin infections. I think we could probably use ceftriaxone + flagyl to target GI source.   Vomiting and GI symptoms = seem to be cyclic and chronic for [facility]. 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 He typically presents with nausea/vomiting when he has esophagitis r/t candida.  Lactic acid normal. Hyponatremia / Hypokalemia - acute on  chronic with GI loss. Antiemetics, electrolyte replacement and fluids per primary team. May be reasonable to start him on fluconazole on presumption if no other cause identified, will consider. LFTs normal. No abdominal tenderness.   His LDH is quite high at nearly 600 for unknown reason. Clinically does not seem to be PJP or pulmonary process. Concerned about lymphoma - follow brain MRI and further imaging recs TBD.  CT scan of C/A/P in March 2022 with some prominent abdominal retroperitoneal nodes similar to 01/2019, thought to be reactive in the setting of HIV. He has not been on MAC prophylax --> check AFB blood cultures.   AKI = related to dehydration and improving with IVF. Will continue Symtuza for now and follow as TAF is overall pretty low risk for nephrotoxicity.   Pulmonary nodules L>R = pulmonary nodules in March 2022. AFB sputum negative. Suspect atypical infection, possible chronic aspiration, post COVID sequela.  Has a component of emphysema.     Plan: Continue linezolid for now pending blood cultures x 48h Change cefepime to ceftriaxone  AFB blood cultures now RPR now HIV VL, CD4 and genotype testing now --> unfortunately I suspect poor adherence. Hope to be surprised.  May need repeat CT scan to follow up on chest nodules (March 2022) and retroperitoneal lymphadenopathy.  May consider fluconazole for known history of esophagitis r/t candida    Principal Problem:   SIRS (systemic inflammatory response syndrome) (HCC) Active Problems:   AIDS (acquired immune deficiency syndrome) (  [place])   Human immunodeficiency virus (HIV) disease ([place])   Hyponatremia   Hypokalemia   AKI (acute kidney injury) ([place])    Darunavir-Cobicisctat-Emtricitabine-Tenofovir Alafenamide  1 tablet Oral Q breakfast   enoxaparin (LOVENOX) injection  40 mg Subcutaneous Q24H   sulfamethoxazole-trimethoprim  1 tablet Oral Daily    HPI: Jacob Gutierrez is a 35 y.o. male being admitted to the hospital  with 2-day history of nausea, vomiting, headaches brought here from EMS.  [name] is well known to our team with history of uncontrolled HIV, AIDS+ (last VL 449,000 CD4 < 35 in February 2022) prescribed Symtuza. Last seen in ID clinic in March 2022 where he was having diarrhea. Has since required hospitalization and work up with colonoscopy in April for the same and acute pyelonephritis symptoms.  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  At that time he had candida esophagitis >> treated with 3-week fluconazole. Given pancreatic enzymes for chronic pancreatic insufficiency (does not sound like he is taking these). CT scan at that time of the chest was concerning for NTM/MAC infection - AFB smear was negative and cultures are negative final.   [place] states he was feeling pretty well and doing better on his HIV medication up until he got suddenly ill. He states he woke up with nausea/vomiting and had a bad pounding frontal headache during coughing/vomiting episodes. Denies neck pain or blurry vision. Headaches are always low level there since onset but really increased when he coughs or vomits. Coughing has been more recent also - can't quite tell when he started with this but it's non-severe and infrequent. Non-productive.  In the ER given headache and measured fevers with known AIDS it was recommended he undergo LP which he has adamantly refused.  Flu/COVID PCR has returned negative (he only received 1 dose of J&J vaccine at the start of the pandemic). He had COVID infection in January 2022 and seems to have recovered completely from that illness. States his bowel movements have improved since  colonoscopy and firmed up to what he would consider normal. Lately however they are more liquid. He cannot keep down any food/fluids or medication by mouth x 48 hours now.   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  Stated at first that he takes them about 50% of the time, then specified that he will do well for 1-2 weeks then miss doses intermittently for a while. Smokes marijuana supplied by friends occasionally but does not use methamphetamines, unless something was laced recently he was unaware of.  In the ER he was found to have high fever 103 F. Has been started on Cefepime and linezolid for empiric antibiotic coverage. Has vancomycin allergy (angioedema/swelling) and amoxicillin allergy to which he describes previous desensitization treatment in the past.  CXR without any lobar pneumonia; stable appearing chronic b/l interstitial prominence with normal hilar structures.  Blood cultures are negative < 24h preliminarily.     Review of Systems: Review of Systems  Constitutional:  Positive for fever.   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 [facility])    H/O coccidioidomycosis    pulmonary    HIV disease ([place])    Past history of allergy to penicillin-type  antibiotic 07-03-2012   desensitization   PNA (pneumonia)    Shigella gastroenteritis    Syphilis    history /treated     Social History   Tobacco Use   Smoking status: Every Day    Packs/day: 0.30    Types: Cigarettes   Smokeless tobacco: Never   Tobacco comments:    cutting back  Vaping Use   Vaping Use: Never used  Substance Use Topics   Alcohol use: Not Currently    Alcohol/week: 1.0 standard drink    Types: 1 Standard drinks or equivalent per week    Comment: whiskey occasionally    Drug use: Not Currently    Frequency: 1.0 times per week    Types: Marijuana    Family History  Problem Relation Age of Onset   Hypertension Mother    Lupus Maternal Grandmother    Diabetes Maternal  Grandmother    Cancer Paternal Grandfather        unknown    Prostate cancer Father    Diabetes Father    Diabetes Maternal Grandfather    Colon cancer Neg Hx    Stomach cancer Neg Hx    Esophageal cancer Neg Hx    Pancreatic cancer Neg Hx    Allergies  Allergen Reactions   Vancomycin Anaphylaxis, Itching, Swelling and Other (See Comments)    Angioedema and "everything swells"   Amoxicillin Other (See Comments)    From childhood: "I had a reaction when i was little." (??)    OBJECTIVE: Blood pressure 106/60, pulse 94, temperature 98.2 F (36.8 C), temperature source Oral, resp. rate (!) 21, height _0  (1.854 m), weight 79.4 kg, SpO2 99 %.  Physical Exam Constitutional:      Appearance: He is well-developed. He is ill-appearing. He is not toxic-appearing.     Comments: Resting on stretch in no distress. Appears fatigued and moderately ill / uncomfortable.   HENT:     Mouth/Throat:     Mouth: Mucous membranes are moist.     Comments: Poor dentition  Eyes:     Extraocular Movements: Extraocular movements intact.     Pupils: Pupils are equal, round, and reactive to light.  Cardiovascular:     Rate and Rhythm: Normal rate and regular rhythm.     Heart sounds: No murmur heard. Pulmonary:     Effort: Pulmonary effort is normal.     Breath sounds: Normal breath sounds. No rhonchi.     Comments: No coughing during visit.  Abdominal:     General: Bowel sounds are normal. There is no distension.     Palpations: Abdomen is soft.     Tenderness: There is no abdominal tenderness.  Musculoskeletal:        General: Normal range of motion.     Cervical back: Normal range of motion. No rigidity.  Lymphadenopathy:     Cervical: No cervical adenopathy.  Skin:    General: Skin is warm and dry.     Capillary Refill: Capillary refill takes less than 2 seconds.     Findings: No rash.     Comments: Multiple well healed lower extremity lesions from previous cellulitis/abscesses. No  active infection.   Neurological:     Mental Status: He is alert and oriented to person, place, and time.  Psychiatric:        Mood and Affect: Mood normal.        Behavior: Behavior normal.    Lab Results Lab Results  Component Value Date  WBC 6.2 02/10/2021   HGB 11.2 (L) 02/10/2021   HCT 35.1 (L) 02/10/2021   MCV 95.4 02/10/2021   PLT 161 02/10/2021    Lab Results  Component Value Date   CREATININE 1.23 02/10/2021   BUN 17 02/10/2021   NA 129 (L) 02/10/2021   K 3.5 02/10/2021   CL 100 02/10/2021   CO2 19 (L) 02/10/2021    Lab Results  Component Value Date   ALT 12 02/09/2021   AST 33 02/09/2021   ALKPHOS 65 02/09/2021   BILITOT 0.9 02/09/2021     Microbiology: Recent Results (from the past 240 hour(s))  Resp Panel by RT-PCR (Flu A&B, Covid) Nasopharyngeal Swab     Status: None   Collection Time: 02/09/21 12:59 PM   Specimen: Nasopharyngeal Swab; Nasopharyngeal(NP) swabs in vial transport medium  Result Value Ref Range Status   SARS Coronavirus 2 by RT PCR NEGATIVE NEGATIVE Final    Comment: (NOTE) SARS-CoV-2 target nucleic acids are NOT DETECTED.  The SARS-CoV-2 RNA is generally detectable in upper respiratory specimens during the acute phase of infection. The lowest concentration of SARS-CoV-2 viral copies this assay can detect is 138 copies/mL. A negative result does not preclude SARS-Cov-2 infection and should not be used as the sole basis for treatment or other patient management decisions. A negative result may occur with  improper specimen collection/handling, submission of specimen other than nasopharyngeal swab, presence of viral mutation(s) within the areas targeted by this assay, and inadequate number of viral copies(<138 copies/mL). A negative result must be combined with clinical observations, patient history, and epidemiological information. The expected result is Negative.  Fact Sheet for Patients:   EntrepreneurPulse.com.au  Fact Sheet for Healthcare Providers:  IncredibleEmployment.be  This test is no t yet approved or cleared by the [place] FDA and  has been authorized for detection and/or diagnosis of SARS-CoV-2 by FDA under an Emergency Use Authorization (EUA). This EUA will remain  in effect (meaning this test can be used) for the duration of the COVID-19 declaration under Section 564(b)(1) of the Act, 21 U.S.C.section 360bbb-3(b)(1), unless the authorization is terminated  or revoked sooner.       Influenza A by PCR NEGATIVE NEGATIVE Final   Influenza B by PCR NEGATIVE NEGATIVE Final    Comment: (NOTE) The Xpert Xpress SARS-CoV-2/FLU/RSV plus assay is intended as an aid in the diagnosis of influenza from Nasopharyngeal swab specimens and should not be used as a sole basis for treatment. Nasal washings and aspirates are unacceptable for Xpert Xpress SARS-CoV-2/FLU/RSV testing.  Fact Sheet for Patients: EntrepreneurPulse.com.au  Fact Sheet for Healthcare Providers: IncredibleEmployment.be  This test is not yet approved or cleared by the [place] FDA and has been authorized for detection and/or diagnosis of SARS-CoV-2 by FDA under an Emergency Use Authorization (EUA). This EUA will remain in effect (meaning this test can be used) for the duration of the COVID-19 declaration under Section 564(b)(1) of the Act, 21 U.S.C. section 360bbb-3(b)(1), unless the authorization is terminated or revoked.  Performed at [facility], [place] [address] [place], [place] [address]   Blood culture (routine x 2)     Status: None (Preliminary result)   Collection Time: 02/09/21  6:35 PM   Specimen: BLOOD  Result Value Ref Range Status   Specimen Description BLOOD RIGHT ANTECUBITAL  Final   Special Requests   Final    BOTTLES DRAWN AEROBIC AND ANAEROBIC Blood Culture adequate volume   Culture    Final  NO GROWTH < 12 HOURS Performed at [facility] [address], [place], [place] [address]    Report Status PENDING  Incomplete  Blood culture (routine x 2)     Status: None (Preliminary result)   Collection Time: 02/09/21  6:35 PM   Specimen: BLOOD  Result Value Ref Range Status   Specimen Description BLOOD LEFT ANTECUBITAL  Final   Special Requests   Final    BOTTLES DRAWN AEROBIC AND ANAEROBIC Blood Culture adequate volume   Culture   Final    NO GROWTH < 12 HOURS Performed at [facility], [place] [address], [place], [place] [address]    Report Status PENDING  Incomplete    Janene Madeira, MSN, NP-C [facility] [place]: [phone] Pager: [phone]  02/10/2021 12:51 PM   I spent 45 min with the patient > 50% of the time in face to face encounter and the rest reviewing current and historical labs, imaging and medical encounters.

## 2021-02-10 NOTE — ED Notes (Signed)
MD Gherghe paged for pt temp 101.5.

## 2021-02-10 NOTE — ED Notes (Addendum)
Pt threw up potassium pills. Dr. Antionette Char notified.

## 2021-02-10 NOTE — Plan of Care (Signed)

## 2021-02-10 NOTE — ED Notes (Signed)
Patient transported to MRI 

## 2021-02-10 NOTE — ED Notes (Signed)
Pt says his HA is coming back. Dr. Antionette Char notified.

## 2021-02-10 NOTE — H&P (Signed)
History and Physical    Jacob Gutierrez XBJ:[id number] DOB: April 28, 1986 DOA: 02/09/2021  PCP: Patient, No Pcp Per (Inactive)   Patient coming from: home   Chief Complaint: Headache, fever, N/V   HPI: Jacob Gutierrez is a 35 y.o. male with medical history significant for HIV/AIDS (CD4 <35 and VL 978k in March), history of treated syphilis, now presenting to the emergency department for evaluation of fevers, chills, fatigue, headache, nausea, and vomiting.  Patient reports he was doing fairly well yesterday until the evening when he developed headache with general aches and malaise, fatigue, nausea, and nonbloody vomiting.  He denies any associated abdominal pain or diarrhea, denies dysuria, and denies photophobia, neck stiffness, rash, or wounds.  He reports adherence with his medications recently.  He has had a mild nonproductive cough but denies shortness of breath.  ED Course: Upon arrival to the ED, patient is found to be febrile to 39.4 C, saturating upper 90s on room air, slightly tachycardic, and with systolic blood pressure in the low 100s.  EKG was sinus tachycardia, rate 106.  Chest x-ray negative for acute cardiopulmonary disease.  Head CT notable for stable chronic encephalomalacia, mild ethmoid and frontal sinus disease, but no acute intracranial abnormality.  Chemistry panel features a sodium 130, potassium 3.1, albumin 2.9, protein 9.3, bicarbonate 18, and creatinine 1.49.  CBC with mild normocytic anemia.  Lactic acid reassuringly normal.  UDS positive for amphetamines and THC.  COVID-19 and influenza PCR is negative.  Ammonia is normal and ethanol undetectable.  Blood cultures and urine cultures were collected in the emergency department and the patient was given a liter of saline, acetaminophen, Zofran, and treated in consultation with pharmacy with cefepime and linezolid.  Review of Systems:  All other systems reviewed and apart from HPI, are negative.  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 [facility])    H/O coccidioidomycosis    pulmonary    HIV disease (HCC)    Past history of allergy to penicillin-type antibiotic 07-03-2012   desensitization   PNA (pneumonia)    Shigella gastroenteritis    Syphilis    history /treated     Past Surgical History:  Procedure Laterality Date   BIOPSY  10/16/2020   Procedure: BIOPSY;  Surgeon: Lemar Lofty., MD;  Location: [facility] ENDOSCOPY;  Service: Gastroenterology;;   BIOPSY  10/23/2020   Procedure: BIOPSY;  Surgeon: Lynann Bologna, MD;  Location: [facility] ENDOSCOPY;  Service: Endoscopy;;   COLONOSCOPY WITH PROPOFOL N/A 10/23/2020   Procedure: COLONOSCOPY WITH PROPOFOL;  Surgeon: Lynann Bologna, MD;  Location: [facility] ENDOSCOPY;  Service: Endoscopy;  Laterality: N/A;   ESOPHAGOGASTRODUODENOSCOPY (EGD) WITH PROPOFOL N/A 10/16/2020   Procedure: ESOPHAGOGASTRODUODENOSCOPY (EGD) WITH PROPOFOL;  Surgeon: Meridee Score Netty Starring., MD;  Location: [facility] ENDOSCOPY;  Service: Gastroenterology;  Laterality: N/A;   TRANSURETHRAL RESECTION OF PROSTATE N/A 12/09/2017   Procedure: TRANSURETHRAL RESECTION OF THE PROSTATE (TURP);  Surgeon: Crist Fat, MD;  Location: [facility] ORS;  Service: Urology;  Laterality: N/A;    Social History:   reports that he has been smoking cigarettes. He has been smoking an average of .3 packs per day. He has never used smokeless tobacco. He reports previous alcohol use of about 1.0 standard drink of alcohol per week. He reports previous drug use. Frequency: 1.00 time per week. Drug: Marijuana.  Allergies  Allergen Reactions   Vancomycin Anaphylaxis, Itching, Swelling and Other (See Comments)    Angioedema and "everything swells"   Amoxicillin Other (See Comments)    From childhood: "I had a reaction when i was little." (??)    Family History   Problem Relation Age of Onset   Hypertension Mother    Lupus Maternal Grandmother    Diabetes Maternal Grandmother    Cancer Paternal Grandfather        unknown    Prostate cancer Father    Diabetes Father    Diabetes Maternal Grandfather    Colon cancer Neg Hx    Stomach cancer Neg Hx    Esophageal cancer Neg Hx    Pancreatic cancer Neg Hx      Prior to Admission medications   Medication Sig Start Date End Date Taking? Authorizing Provider  Darunavir-Cobicisctat-Emtricitabine-Tenofovir Alafenamide (SYMTUZA) 800-150-200-10 MG TABS Take 1 tablet by mouth daily with breakfast. 10/23/20  Yes Ghimire, Werner Lean, MD  sulfamethoxazole-trimethoprim (BACTRIM DS) 800-160 MG tablet TAKE 1 TABLET BY MOUTH DAILY. 10/23/20 10/23/21 Yes Ghimire, Werner Lean, MD  azithromycin (ZITHROMAX) 500 MG tablet TAKE 1 TABLET (500 MG TOTAL) BY MOUTH DAILY FOR 7 DAYS. Patient not taking: No sig reported 09/25/20 09/25/21  Unk Lightning, PA  Darunavir-Cobicisctat-Emtricitabine-Tenofovir Alafenamide (SYMTUZA) 800-150-200-10 MG TABS TAKE 1 TABLET BY MOUTH DAILY WITH BREAKFAST. Patient not taking: No sig reported 10/23/20 10/23/21  Maretta Bees, MD  ferrous sulfate 324 (65 Fe) MG TBEC Take 1 tablet (325 mg total) by mouth daily after breakfast. Patient not taking: No sig reported 10/23/20   Maretta Bees, MD  ferrous sulfate 325 (65 FE) MG tablet TAKE 1 TABLET (325 MG TOTAL) BY MOUTH DAILY AFTER BREAKFAST. Patient not taking: No sig reported 10/23/20 10/23/21  Maretta Bees, MD  fluconazole (DIFLUCAN) 200 MG tablet TAKE 2 TABLETS (400 MG TOTAL) BY MOUTH DAILY FOR 13 DAYS. Patient not taking: No sig reported 10/23/20 10/23/21  Ghimire, Werner Lean, MD  lipase/protease/amylase (CREON) 36000 UNITS CPEP capsule Take 2 capsules (72,000 units) by mouth WITH every meal, take 1 capsule (36,000 units) by mouth with every snack. Patient not taking: No sig reported 10/23/20   Maretta Bees, MD  lipase/protease/amylase  (CREON) 36000 UNITS CPEP capsule TAKE 2 CAPSULES (72,000 UNITS) BY MOUTH WITH EVERY MEAL, TAKE 1 CAPSULE (36,000 UNITS) BY MOUTH WITH EVERY SNACK. Patient not taking: No sig reported 10/23/20 10/23/21  Ghimire, Werner Lean, MD  lipase/protease/amylase (CREON) 36000 UNITS CPEP capsule TAKE 2 CAPSULES (72,000 UNITS) BY MOUTH WITH EVERY MEAL, TAKE 1 CAPSULE (36,000 UNITS) BY MOUTH WITH EVERY SNACK. Patient not taking: No sig reported 09/25/20 09/25/21  Unk Lightning, PA  loperamide (IMODIUM A-D) 2 MG tablet Take 1 tablet (2 mg total) by mouth every 8 (eight) hours. Patient not taking: No sig reported 09/18/20   Unk Lightning, PA  loperamide (IMODIUM) 2 MG capsule TAKE 1 TABLET (2 MG TOTAL) BY MOUTH EVERY 8 (EIGHT) HOURS. Patient not taking: No sig reported 09/18/20 09/18/21  Unk Lightning, PA  metoCLOPramide (REGLAN) 10 MG tablet TAKE 1 TABLET (10 MG TOTAL) BY MOUTH EVERY SIX HOURS. Patient not taking: No sig reported 09/02/20 09/02/21  Mare Ferrari, PA-C  nitrofurantoin, macrocrystal-monohydrate, (MACROBID) 100 MG capsule TAKE 1 CAPSULE BY MOUTH 2 TIMES DAILY FOR 7 DAYS Patient not taking: No sig reported 08/27/20 08/27/21  Veryl Speak, FNP  omeprazole (PRILOSEC) 20 MG capsule TAKE 1 CAPSULE (20 MG TOTAL) BY  MOUTH 2 (TWO) TIMES DAILY BEFORE A MEAL. TAKE ONE CAPSULE 30-60 MINUTES BEFORE BREAKFAST AND DINNER Patient not taking: No sig reported 09/18/20 09/18/21  Unk Lightning, PA  pantoprazole (PROTONIX) 40 MG tablet Take 1 tablet (40 mg total) by mouth 2 (two) times daily. Patient not taking: No sig reported 10/23/20   Maretta Bees, MD  pantoprazole (PROTONIX) 40 MG tablet TAKE 1 TABLET (40 MG TOTAL) BY MOUTH 2 (TWO) TIMES DAILY. Patient not taking: No sig reported 10/23/20 10/23/21  Ghimire, Werner Lean, MD  potassium chloride (KLOR-CON) 10 MEQ tablet TAKE 2 TABLETS BY MOUTH DAILY. Patient not taking: No sig reported 10/14/20 10/14/21  Anders Simmonds, PA-C  potassium chloride  SA (KLOR-CON) 20 MEQ tablet TAKE 1 TABLET (20 MEQ TOTAL) BY MOUTH TWO TIMES DAILY. Patient not taking: No sig reported 09/02/20 09/02/21  Mare Ferrari, PA-C  sulfamethoxazole-trimethoprim (BACTRIM) 400-80 MG tablet Take 1 tablet by mouth daily. Patient not taking: No sig reported 10/23/20   Maretta Bees, MD    Physical Exam: Vitals:   02/09/21 2100 02/09/21 2130 02/09/21 2200 02/09/21 2236  BP: 102/65 110/64 102/69   Pulse: (!) 108 (!) 108 (!) 104   Resp: 18 (!) 29 (!) 29   Temp:    99.6 F (37.6 C)  TempSrc:    Oral  SpO2: 98% 97% 98%   Weight:      Height:        Constitutional: NAD, calm, cachectic  Eyes: PERTLA, lids and conjunctivae normal ENMT: Mucous membranes are moist. Posterior pharynx clear of any exudate or lesions.   Neck:  supple, no masses  Respiratory:  no wheezing, no crackles. No accessory muscle use.  Cardiovascular: S1 & S2 heard, regular rate and rhythm. No extremity edema.   Abdomen: No distension, no tenderness, soft. Bowel sounds active.  Musculoskeletal: no clubbing / cyanosis. No joint deformity upper and lower extremities.   Skin: no significant rashes, lesions, ulcers. Warm, dry, well-perfused. Neurologic: CN 2-12 grossly intact. Sensation intact. Moving all extremities.  Psychiatric: Alert and oriented to person, place, and situation. Pleasant and cooperative.    Labs and Imaging on Admission: I have personally reviewed following labs and imaging studies  CBC: Recent Labs  Lab 02/09/21 1241  WBC 7.6  NEUTROABS 5.9  HGB 11.3*  HCT 35.6*  MCV 94.9  PLT 199   Basic Metabolic Panel: Recent Labs  Lab 02/09/21 1241  NA 130*  K 3.1*  CL 100  CO2 18*  GLUCOSE 170*  BUN 20  CREATININE 1.49*  CALCIUM 8.0*   GFR: Estimated Creatinine Clearance: 76.2 mL/min (A) (by C-G formula based on SCr of 1.49 mg/dL (H)). Liver Function Tests: Recent Labs  Lab 02/09/21 1241  AST 33  ALT 12  ALKPHOS 65  BILITOT 0.9  PROT 9.3*  ALBUMIN 2.9*    Recent Labs  Lab 02/09/21 1241  LIPASE 33   Recent Labs  Lab 02/09/21 1818  AMMONIA 15   Coagulation Profile: No results for input(s): INR, PROTIME in the last 168 hours. Cardiac Enzymes: No results for input(s): CKTOTAL, CKMB, CKMBINDEX, TROPONINI in the last 168 hours. BNP (last 3 results) No results for input(s): PROBNP in the last 8760 hours. HbA1C: No results for input(s): HGBA1C in the last 72 hours. CBG: No results for input(s): GLUCAP in the last 168 hours. Lipid Profile: No results for input(s): CHOL, HDL, LDLCALC, TRIG, CHOLHDL, LDLDIRECT in the last 72 hours. Thyroid Function Tests: No results for  input(s): TSH, T4TOTAL, FREET4, T3FREE, THYROIDAB in the last 72 hours. Anemia Panel: No results for input(s): VITAMINB12, FOLATE, FERRITIN, TIBC, IRON, RETICCTPCT in the last 72 hours. Urine analysis:    Component Value Date/Time   COLORURINE AMBER (A) 02/09/2021 1818   APPEARANCEUR CLOUDY (A) 02/09/2021 1818   APPEARANCEUR Hazy 07/27/2013 1742   LABSPEC 1.016 02/09/2021 1818   LABSPEC 1.026 07/27/2013 1742   PHURINE 6.0 02/09/2021 1818   GLUCOSEU NEGATIVE 02/09/2021 1818   GLUCOSEU Negative 07/27/2013 1742   HGBUR MODERATE (A) 02/09/2021 1818   BILIRUBINUR NEGATIVE 02/09/2021 1818   BILIRUBINUR Negative 07/27/2013 1742   KETONESUR NEGATIVE 02/09/2021 1818   PROTEINUR 100 (A) 02/09/2021 1818   UROBILINOGEN 1 12/17/2013 1118   NITRITE NEGATIVE 02/09/2021 1818   LEUKOCYTESUR LARGE (A) 02/09/2021 1818   LEUKOCYTESUR Negative 07/27/2013 1742   Sepsis Labs: @LABRCNTIP (procalcitonin:4,lacticidven:4) ) Recent Results (from the past 240 hour(s))  Resp Panel by RT-PCR (Flu A&B, Covid) Nasopharyngeal Swab     Status: None   Collection Time: 02/09/21 12:59 PM   Specimen: Nasopharyngeal Swab; Nasopharyngeal(NP) swabs in vial transport medium  Result Value Ref Range Status   SARS Coronavirus 2 by RT PCR NEGATIVE NEGATIVE Final    Comment: (NOTE) SARS-CoV-2 target  nucleic acids are NOT DETECTED.  The SARS-CoV-2 RNA is generally detectable in upper respiratory specimens during the acute phase of infection. The lowest concentration of SARS-CoV-2 viral copies this assay can detect is 138 copies/mL. A negative result does not preclude SARS-Cov-2 infection and should not be used as the sole basis for treatment or other patient management decisions. A negative result may occur with  improper specimen collection/handling, submission of specimen other than nasopharyngeal swab, presence of viral mutation(s) within the areas targeted by this assay, and inadequate number of viral copies(<138 copies/mL). A negative result must be combined with clinical observations, patient history, and epidemiological information. The expected result is Negative.  Fact Sheet for Patients:  BloggerCourse.comhttps://www.fda.gov/media/152166/download  Fact Sheet for Healthcare Providers:  SeriousBroker.ithttps://www.fda.gov/media/152162/download  This test is no t yet approved or cleared by the [place] FDA and  has been authorized for detection and/or diagnosis of SARS-CoV-2 by FDA under an Emergency Use Authorization (EUA). This EUA will remain  in effect (meaning this test can be used) for the duration of the COVID-19 declaration under Section 564(b)(1) of the Act, 21 U.S.C.section 360bbb-3(b)(1), unless the authorization is terminated  or revoked sooner.       Influenza A by PCR NEGATIVE NEGATIVE Final   Influenza B by PCR NEGATIVE NEGATIVE Final    Comment: (NOTE) The Xpert Xpress SARS-CoV-2/FLU/RSV plus assay is intended as an aid in the diagnosis of influenza from Nasopharyngeal swab specimens and should not be used as a sole basis for treatment. Nasal washings and aspirates are unacceptable for Xpert Xpress SARS-CoV-2/FLU/RSV testing.  Fact Sheet for Patients: BloggerCourse.comhttps://www.fda.gov/media/152166/download  Fact Sheet for Healthcare  Providers: SeriousBroker.ithttps://www.fda.gov/media/152162/download  This test is not yet approved or cleared by the [place] FDA and has been authorized for detection and/or diagnosis of SARS-CoV-2 by FDA under an Emergency Use Authorization (EUA). This EUA will remain in effect (meaning this test can be used) for the duration of the COVID-19 declaration under Section 564(b)(1) of the Act, 21 U.S.C. section 360bbb-3(b)(1), unless the authorization is terminated or revoked.  Performed at [facility], [address] [place], [place] [address]      Radiological Exams on Admission: DG Chest 2 View  Result Date: 02/09/2021 CLINICAL DATA:  Cough. EXAM: CHEST - 2 VIEW COMPARISON:  CT 10/15/2020.  Chest x-ray 08/07/2020, 09/19/2018. FINDINGS: Mediastinum hilar structures normal. Heart size normal. Stable mild chronic interstitial prominence. No acute alveolar infiltrate. No pleural effusion or pneumothorax. IMPRESSION: Stable chronic bilateral interstitial prominence. No acute cardiopulmonary disease identified. Electronically Signed   By: Maisie Fus  Register   On: 02/09/2021 13:22   CT Head Wo Contrast  Result Date: 02/09/2021 CLINICAL DATA:  Headache for 2 days, emesis EXAM: CT HEAD WITHOUT CONTRAST TECHNIQUE: Contiguous axial images were obtained from the base of the skull through the vertex without intravenous contrast. COMPARISON:  09/20/2018 FINDINGS: Brain: Chronic encephalomalacia within the left parietal region unchanged. No signs of acute infarct or hemorrhage. Lateral ventricles and midline structures are unremarkable. No acute extra-axial fluid collections. No mass effect. Vascular: No hyperdense vessel or unexpected calcification. Skull: Normal. Negative for fracture or focal lesion. Sinuses/Orbits: Mild mucosal thickening of the ethmoid and frontal sinuses. No gas fluid levels. Other: None. IMPRESSION: 1. Mild ethmoid and frontal sinus disease. 2. Stable chronic encephalomalacia left  parietal region. No acute process. Electronically Signed   By: Sharlet Salina M.D.   On: 02/09/2021 20:15    EKG: Independently reviewed. Sinus tachycardia, rate 106.   Assessment/Plan  1. SIRS  - Patient most recent CD4 <35 presents with one day of headache, fevers, and N/V without abdominal pain, diarrhea, or urinary sxs, adamantly refused LP, had blood and urine cultures collected, and was started on cefepime and linezolid in consultation with pharmacy  - Continue current antibiotics, follow cultures and clinical course, check Cryptococcal antigen, toxoplasmosis IgM, and LDH    2. AKI  - SCr is 1.49 on admission, up from 0.83 in March 2022  - Likely acute prerenal azotemia in setting of N/V  - Continue IVF hydration, repeat chem panel in am    3. Hyponatremia; hypokalemia  - Serum sodium 130 in setting of hypovolemia, potassium 3.1 likely due to GI losses  - Continue IVF hydration, supplement potassium    4. AIDS  - CD4 was <35 and VL 978k in March 2022  - He reports recent adherence to [facility] and Bactrim    5. Substance abuse  - UDS positive for amphetamines and THC  - Counseled toward cessation     DVT prophylaxis: Lovenox  Code Status: Full  Level of Care: Level of care: Telemetry Medical Family Communication: None present  Disposition Plan:  Patient is from: home  Anticipated d/c is to: Home  Anticipated d/c date is: 02/12/21 Patient currently: Pending cultures and clinical course  Consults called: none  Admission status: Inpatient     Briscoe Deutscher, MD [facility]  02/10/2021, 12:24 AM

## 2021-02-10 NOTE — ED Notes (Signed)
Pt coughed and had BM. Pt cleaned up. Bed changed and brief placed on pt. Pt also used bedpan afterwards and had another one.

## 2021-02-10 NOTE — ED Notes (Signed)
Attempted report x1. 

## 2021-02-10 NOTE — Progress Notes (Signed)
PROGRESS NOTE  Jacob Gutierrez ZSM:[id number] DOB: Dec 26, 1985 DOA: 02/09/2021 PCP: Patient, No Pcp Per (Inactive)   LOS: 1 day   Brief Narrative / Interim history: 35 year old male with HIV/AIDS (CD4 less than 35 and viral load 978K in March), nonadherence to anti-HIV regimen, history of treated syphilis who was admitted to the hospital with fever, chills, headaches, nausea and vomiting.  He was doing fairly well up until the day prior to admission and all of a sudden felt quite sick and came to the hospital.  He was febrile in the ED to 39.4, tachycardic, chest x-ray was unremarkable.  Head CT showed stable chronic encephalomalacia without anything acute, UDS showed amphetamines and THC.  COVID-19 was negative.  He was placed on broad-spectrum antibiotics and was admitted to the hospital.  Patient refused an LP  Subjective / 24h Interval events: Continues to have intermittent nausea, has not had any vomiting.  He is puzzled by how fast the onset was and how sick he got  Assessment & Plan: Principal Problem SIRS -patient with significant immunosuppression in the setting of nonadherence with antiretroviral therapy.  He was placed on broad-spectrum antibiotics with cefepime, linezolid, infectious disease was consulted.  I discussed with Dr. Algis Liming, he will see in consultation -Due to headache, fever, obtain MRI of the brain.  Of note, again refused LP.  Active Problems Acute kidney injury-creatinine on admission 1.49 from normal values in the past.  Creatinine improved with fluids and is 1.23 this morning.  Continue to closely monitor, avoid nephrotoxins  Hypokalemia, hyponatremia-possibly in the setting of GI losses with nausea, vomiting.  Keep on fluids.  AIDS-he says he takes his medications "most of the time".  I suspect a degree of nonadherence  Substance use-UDS positive for amphetamines, THC   Scheduled Meds:  Darunavir-Cobicisctat-Emtricitabine-Tenofovir Alafenamide  1 tablet Oral  Q breakfast   enoxaparin (LOVENOX) injection  40 mg Subcutaneous Q24H   sulfamethoxazole-trimethoprim  1 tablet Oral Daily   Continuous Infusions:  0.9 % NaCl with KCl 20 mEq / L 125 mL/hr at 02/10/21 0254   ceFEPime (MAXIPIME) IV Stopped (02/10/21 0744)   linezolid (ZYVOX) IV Stopped (02/10/21 1028)   PRN Meds:.acetaminophen, ketorolac, ondansetron (ZOFRAN) IV  Diet Orders (From admission, onward)     Start     Ordered   02/09/21 2346  Diet regular Room service appropriate? Yes; Fluid consistency: Thin  Diet effective now       Question Answer Comment  Room service appropriate? Yes   Fluid consistency: Thin      02/09/21 2345            DVT prophylaxis: enoxaparin (LOVENOX) injection 40 mg Start: 02/10/21 1000     Code Status: Full Code  Family Communication: no family at bedside   Status is: Inpatient  Remains inpatient appropriate because:Inpatient level of care appropriate due to severity of illness  Dispo: The patient is from: Home              Anticipated d/c is to: Home              Patient currently is not medically stable to d/c.   Difficult to place patient No  Level of care: Telemetry Medical  Consultants:  ID  Procedures:  None   Microbiology  Blood culture 7/19-NGTD RT-PCR-negative  Antimicrobials: Cefepime, Zyvox 7/19 >>   Objective: Vitals:   02/10/21 0702 02/10/21 0804 02/10/21 0959 02/10/21 1010  BP: 119/69   106/60  Pulse: (!) 112  94  Resp: (!) 21   (!) 21  Temp:  (!) 101.5 F (38.6 C) 98.2 F (36.8 C)   TempSrc:  Oral Oral   SpO2: 96%   99%  Weight:      Height:        Intake/Output Summary (Last 24 hours) at 02/10/2021 1029 Last data filed at 02/10/2021 1028 Gross per 24 hour  Intake 2555 ml  Output 700 ml  Net 1855 ml   Filed Weights   02/09/21 2045  Weight: 79.4 kg    Examination:  Constitutional: NAD Eyes: no scleral icterus ENMT: Mucous membranes are moist.  Neck: normal, supple Respiratory: clear to  auscultation bilaterally, no wheezing, no crackles. Normal respiratory effort.  Cardiovascular: Regular rate and rhythm, no murmurs / rubs / gallops. No LE edema.  Abdomen: non distended, no tenderness. Bowel sounds positive.  Musculoskeletal: no clubbing / cyanosis.  Skin: no rashes Neurologic: CN 2-12 grossly intact. Strength 5/5 in all 4.    Data Reviewed: I have independently reviewed following labs and imaging studies  CBC: Recent Labs  Lab 02/09/21 1241 02/10/21 0316  WBC 7.6 6.2  NEUTROABS 5.9  --   HGB 11.3* 11.2*  HCT 35.6* 35.1*  MCV 94.9 95.4  PLT 199 161   Basic Metabolic Panel: Recent Labs  Lab 02/09/21 1241 02/10/21 0316  NA 130* 129*  K 3.1* 3.5  CL 100 100  CO2 18* 19*  GLUCOSE 170* 111*  BUN 20 17  CREATININE 1.49* 1.23  CALCIUM 8.0* 7.9*  MG  --  1.9   Liver Function Tests: Recent Labs  Lab 02/09/21 1241  AST 33  ALT 12  ALKPHOS 65  BILITOT 0.9  PROT 9.3*  ALBUMIN 2.9*   Coagulation Profile: No results for input(s): INR, PROTIME in the last 168 hours. HbA1C: No results for input(s): HGBA1C in the last 72 hours. CBG: No results for input(s): GLUCAP in the last 168 hours.  Recent Results (from the past 240 hour(s))  Resp Panel by RT-PCR (Flu A&B, Covid) Nasopharyngeal Swab     Status: None   Collection Time: 02/09/21 12:59 PM   Specimen: Nasopharyngeal Swab; Nasopharyngeal(NP) swabs in vial transport medium  Result Value Ref Range Status   SARS Coronavirus 2 by RT PCR NEGATIVE NEGATIVE Final    Comment: (NOTE) SARS-CoV-2 target nucleic acids are NOT DETECTED.  The SARS-CoV-2 RNA is generally detectable in upper respiratory specimens during the acute phase of infection. The lowest concentration of SARS-CoV-2 viral copies this assay can detect is 138 copies/mL. A negative result does not preclude SARS-Cov-2 infection and should not be used as the sole basis for treatment or other patient management decisions. A negative result may  occur with  improper specimen collection/handling, submission of specimen other than nasopharyngeal swab, presence of viral mutation(s) within the areas targeted by this assay, and inadequate number of viral copies(<138 copies/mL). A negative result must be combined with clinical observations, patient history, and epidemiological information. The expected result is Negative.  Fact Sheet for Patients:  BloggerCourse.comhttps://www.fda.gov/media/152166/download  Fact Sheet for Healthcare Providers:  SeriousBroker.ithttps://www.fda.gov/media/152162/download  This test is no t yet approved or cleared by the [place] FDA and  has been authorized for detection and/or diagnosis of SARS-CoV-2 by FDA under an Emergency Use Authorization (EUA). This EUA will remain  in effect (meaning this test can be used) for the duration of the COVID-19 declaration under Section 564(b)(1) of the Act, 21 U.S.C.section 360bbb-3(b)(1), unless the authorization is terminated  or revoked sooner.       Influenza A by PCR NEGATIVE NEGATIVE Final   Influenza B by PCR NEGATIVE NEGATIVE Final    Comment: (NOTE) The Xpert Xpress SARS-CoV-2/FLU/RSV plus assay is intended as an aid in the diagnosis of influenza from Nasopharyngeal swab specimens and should not be used as a sole basis for treatment. Nasal washings and aspirates are unacceptable for Xpert Xpress SARS-CoV-2/FLU/RSV testing.  Fact Sheet for Patients: BloggerCourse.com  Fact Sheet for Healthcare Providers: SeriousBroker.it  This test is not yet approved or cleared by the [place] FDA and has been authorized for detection and/or diagnosis of SARS-CoV-2 by FDA under an Emergency Use Authorization (EUA). This EUA will remain in effect (meaning this test can be used) for the duration of the COVID-19 declaration under Section 564(b)(1) of the Act, 21 U.S.C. section 360bbb-3(b)(1), unless the authorization is terminated  or revoked.  Performed at [facility], [address], [place], [place] [address]   Blood culture (routine x 2)     Status: None (Preliminary result)   Collection Time: 02/09/21  6:35 PM   Specimen: BLOOD  Result Value Ref Range Status   Specimen Description BLOOD RIGHT ANTECUBITAL  Final   Special Requests   Final    BOTTLES DRAWN AEROBIC AND ANAEROBIC Blood Culture adequate volume   Culture   Final    NO GROWTH < 12 HOURS Performed at [facility], [address] [place], [place] [address]    Report Status PENDING  Incomplete  Blood culture (routine x 2)     Status: None (Preliminary result)   Collection Time: 02/09/21  6:35 PM   Specimen: BLOOD  Result Value Ref Range Status   Specimen Description BLOOD LEFT ANTECUBITAL  Final   Special Requests   Final    BOTTLES DRAWN AEROBIC AND ANAEROBIC Blood Culture adequate volume   Culture   Final    NO GROWTH < 12 HOURS Performed at [facility], [address], [place], [place] [address]    Report Status PENDING  Incomplete     Radiology Studies: DG Chest 2 View  Result Date: 02/09/2021 CLINICAL DATA:  Cough. EXAM: CHEST - 2 VIEW COMPARISON:  CT 10/15/2020.  Chest x-ray 08/07/2020, 09/19/2018. FINDINGS: Mediastinum hilar structures normal. Heart size normal. Stable mild chronic interstitial prominence. No acute alveolar infiltrate. No pleural effusion or pneumothorax. IMPRESSION: Stable chronic bilateral interstitial prominence. No acute cardiopulmonary disease identified. Electronically Signed   By: Maisie Fus  Register   On: 02/09/2021 13:22   CT Head Wo Contrast  Result Date: 02/09/2021 CLINICAL DATA:  Headache for 2 days, emesis EXAM: CT HEAD WITHOUT CONTRAST TECHNIQUE: Contiguous axial images were obtained from the base of the skull through the vertex without intravenous contrast. COMPARISON:  09/20/2018 FINDINGS: Brain: Chronic encephalomalacia within the left parietal region unchanged. No signs of acute  infarct or hemorrhage. Lateral ventricles and midline structures are unremarkable. No acute extra-axial fluid collections. No mass effect. Vascular: No hyperdense vessel or unexpected calcification. Skull: Normal. Negative for fracture or focal lesion. Sinuses/Orbits: Mild mucosal thickening of the ethmoid and frontal sinuses. No gas fluid levels. Other: None. IMPRESSION: 1. Mild ethmoid and frontal sinus disease. 2. Stable chronic encephalomalacia left parietal region. No acute process. Electronically Signed   By: Sharlet Salina M.D.   On: 02/09/2021 20:15    Pamella Pert, MD, PhD [facility]  Between 7 am - 7 pm I am available, please contact me via  Amion (for emergencies) or Securechat (non urgent messages)  Between 7 pm - 7 am I am not available, please contact night coverage MD/APP via Amion

## 2021-02-11 ENCOUNTER — Inpatient Hospital Stay (HOSPITAL_COMMUNITY): Payer: Medicaid Other

## 2021-02-11 ENCOUNTER — Other Ambulatory Visit (HOSPITAL_COMMUNITY): Payer: Medicaid Other

## 2021-02-11 DIAGNOSIS — B20 Human immunodeficiency virus [HIV] disease: Secondary | ICD-10-CM | POA: Diagnosis not present

## 2021-02-11 DIAGNOSIS — R6510 Systemic inflammatory response syndrome (SIRS) of non-infectious origin without acute organ dysfunction: Secondary | ICD-10-CM | POA: Diagnosis not present

## 2021-02-11 DIAGNOSIS — Z91199 Patient's noncompliance with other medical treatment and regimen due to unspecified reason: Secondary | ICD-10-CM

## 2021-02-11 DIAGNOSIS — R5081 Fever presenting with conditions classified elsewhere: Secondary | ICD-10-CM

## 2021-02-11 DIAGNOSIS — N19 Unspecified kidney failure: Secondary | ICD-10-CM

## 2021-02-11 DIAGNOSIS — R1114 Bilious vomiting: Secondary | ICD-10-CM

## 2021-02-11 DIAGNOSIS — N179 Acute kidney failure, unspecified: Secondary | ICD-10-CM | POA: Diagnosis not present

## 2021-02-11 DIAGNOSIS — N12 Tubulo-interstitial nephritis, not specified as acute or chronic: Secondary | ICD-10-CM

## 2021-02-11 DIAGNOSIS — Z9119 Patient's noncompliance with other medical treatment and regimen: Secondary | ICD-10-CM

## 2021-02-11 DIAGNOSIS — N139 Obstructive and reflux uropathy, unspecified: Secondary | ICD-10-CM

## 2021-02-11 LAB — COMPREHENSIVE METABOLIC PANEL
ALT: 12 U/L (ref 0–44)
AST: 24 U/L (ref 15–41)
Albumin: 2.1 g/dL — ABNORMAL LOW (ref 3.5–5.0)
Alkaline Phosphatase: 47 U/L (ref 38–126)
Anion gap: 12 (ref 5–15)
BUN: 25 mg/dL — ABNORMAL HIGH (ref 6–20)
CO2: 15 mmol/L — ABNORMAL LOW (ref 22–32)
Calcium: 7.3 mg/dL — ABNORMAL LOW (ref 8.9–10.3)
Chloride: 102 mmol/L (ref 98–111)
Creatinine, Ser: 2.62 mg/dL — ABNORMAL HIGH (ref 0.61–1.24)
GFR, Estimated: 31 mL/min — ABNORMAL LOW (ref >60)
Glucose, Bld: 86 mg/dL (ref 70–99)
Potassium: 3.5 mmol/L (ref 3.5–5.1)
Sodium: 129 mmol/L — ABNORMAL LOW (ref 135–145)
Total Bilirubin: 0.9 mg/dL (ref 0.3–1.2)
Total Protein: 6.9 g/dL (ref 6.5–8.1)

## 2021-02-11 LAB — CBC
HCT: 27.7 % — ABNORMAL LOW (ref 39.0–52.0)
Hemoglobin: 9.2 g/dL — ABNORMAL LOW (ref 13.0–17.0)
MCH: 30.4 pg (ref 26.0–34.0)
MCHC: 33.2 g/dL (ref 30.0–36.0)
MCV: 91.4 fL (ref 80.0–100.0)
Platelets: 158 10*3/uL (ref 150–400)
RBC: 3.03 MIL/uL — ABNORMAL LOW (ref 4.22–5.81)
RDW: 15.6 % — ABNORMAL HIGH (ref 11.5–15.5)
WBC: 4.3 10*3/uL (ref 4.0–10.5)
nRBC: 0 % (ref 0.0–0.2)

## 2021-02-11 LAB — TROPONIN I (HIGH SENSITIVITY)
Troponin I (High Sensitivity): 44 ng/L — ABNORMAL HIGH (ref <18)
Troponin I (High Sensitivity): 46 ng/L — ABNORMAL HIGH (ref <18)

## 2021-02-11 LAB — URINALYSIS, ROUTINE W REFLEX MICROSCOPIC
Bilirubin Urine: NEGATIVE
Glucose, UA: NEGATIVE mg/dL
Ketones, ur: NEGATIVE mg/dL
Nitrite: NEGATIVE
Protein, ur: 30 mg/dL — AB
Specific Gravity, Urine: 1.013 (ref 1.005–1.030)
pH: 6 (ref 5.0–8.0)

## 2021-02-11 LAB — TOXOPLASMA ANTIBODIES- IGG AND  IGM
Toxoplasma Antibody- IgM: <3 AU/mL (ref 0.0–7.9)
Toxoplasma IgG Ratio: <3 IU/mL (ref 0.0–7.1)

## 2021-02-11 LAB — RPR
RPR Ser Ql: REACTIVE — AB
RPR Titer: 1:1 {titer}

## 2021-02-11 LAB — CORTISOL-AM, BLOOD: Cortisol - AM: 23.6 ug/dL — ABNORMAL HIGH (ref 6.7–22.6)

## 2021-02-11 MED ORDER — OXYCODONE HCL 5 MG PO TABS
5.0000 mg | ORAL_TABLET | Freq: Once | ORAL | Status: AC | PRN
  Administered 2021-02-11: 5 mg via ORAL
  Filled 2021-02-11: qty 1

## 2021-02-11 MED ORDER — SODIUM CHLORIDE 0.9 % IV BOLUS
1000.0000 mL | Freq: Once | INTRAVENOUS | Status: AC
  Administered 2021-02-11: 1000 mL via INTRAVENOUS

## 2021-02-11 MED ORDER — ACETAMINOPHEN 650 MG RE SUPP: 650.0000 mg | Freq: Four times a day (QID) | RECTAL | Status: DC | PRN

## 2021-02-11 MED ORDER — CHLORHEXIDINE GLUCONATE CLOTH 2 % EX PADS
6.0000 | MEDICATED_PAD | Freq: Every day | CUTANEOUS | Status: DC
  Administered 2021-02-11 – 2021-02-15 (×5): 6 via TOPICAL

## 2021-02-11 MED ORDER — SODIUM CHLORIDE 0.9 % IV SOLN
1.0000 g | Freq: Two times a day (BID) | INTRAVENOUS | Status: DC
  Administered 2021-02-11 (×2): 1 g via INTRAVENOUS
  Filled 2021-02-11 (×4): qty 1

## 2021-02-11 MED ORDER — SULFAMETHOXAZOLE-TRIMETHOPRIM 800-160 MG PO TABS
1.0000 | ORAL_TABLET | ORAL | Status: DC
  Administered 2021-02-12 – 2021-02-15 (×2): 1 via ORAL
  Filled 2021-02-11 (×3): qty 1

## 2021-02-11 MED ORDER — MIDODRINE HCL 5 MG PO TABS
10.0000 mg | ORAL_TABLET | Freq: Three times a day (TID) | ORAL | Status: DC
  Administered 2021-02-11 – 2021-02-15 (×11): 10 mg via ORAL
  Filled 2021-02-11 (×11): qty 2

## 2021-02-11 MED ORDER — FLUCONAZOLE 100MG IVPB
100.0000 mg | INTRAVENOUS | Status: DC
  Administered 2021-02-11 – 2021-02-12 (×2): 100 mg via INTRAVENOUS
  Filled 2021-02-11 (×4): qty 50

## 2021-02-11 NOTE — Progress Notes (Signed)
PROGRESS NOTE  Jacob Gutierrez ESP:[id number] DOB: 04/24/1986 DOA: 02/09/2021 PCP: Patient, No Pcp Per (Inactive)   LOS: 2 days   Brief Narrative / Interim history: 35 year old male with HIV/AIDS (CD4 less than 35 and viral load 978K in March), nonadherence to anti-HIV regimen, history of treated syphilis who was admitted to the hospital with fever, chills, headaches, nausea and vomiting.  He was doing fairly well up until the day prior to admission and all of a sudden felt quite sick and came to the hospital.  He was febrile in the ED to 39.4, tachycardic, chest x-ray was unremarkable.  Head CT showed stable chronic encephalomalacia without anything acute, UDS showed amphetamines and THC.  COVID-19 was negative.  He was placed on broad-spectrum antibiotics and was admitted to the hospital.  Patient refused an LP  Subjective / 24h Interval events: Continues to have persistent nausea and vomiting, was feeling better last night but feels quite sick this morning again.  Has been hypotensive overnight requiring several fluid boluses, in addition to what he received in the ED received 3 L since last night  Assessment & Plan: Principal Problem SIRS, persistent hypotension-patient with significant immunosuppression in the setting of nonadherence with antiretroviral therapy.  He was placed on broad-spectrum antibiotics and infectious disease was consulted -Due to headache and fever there was concern for meningitis but he has been refusing LP.  An MRI of the brain was negative for leptomeningeal enhancement -She remains persistently symptomatic, hypotensive, continue fluids and will obtain a CT scan of the abdomen and pelvis -RPR pending, HIV RNA pending, CD4 pending, blood AFP and cultures pending -Cryptococcal antigen negative, toxoplasma antibodies negative -Persistently hypertensive overnight, now with worsening renal function, continue fluids.  Cortisol was checked and it was appropriately high.   May need midodrine versus iv pressors based on blood pressure trend today  Active Problems Acute kidney injury-creatinine on admission 1.49 from normal values in the past.  Creatinine initially improved yesterday however it significantly worse today at 2.5 -Add strict I&O, bladder scan, CT scan of the abdomen and pelvis pending  Hypokalemia, hyponatremia-possibly in the setting of GI losses with nausea, vomiting.  Keep on fluids.  AIDS-he says he takes his medications "most of the time".  I suspect a degree of nonadherence  Substance use-UDS positive for amphetamines, THC   Scheduled Meds:  Darunavir-Cobicisctat-Emtricitabine-Tenofovir Alafenamide  1 tablet Oral Q breakfast   enoxaparin (LOVENOX) injection  40 mg Subcutaneous Q24H   sulfamethoxazole-trimethoprim  1 tablet Oral Daily   Continuous Infusions:  sodium chloride 100 mL/hr at 02/11/21 0956   cefTRIAXone (ROCEPHIN)  IV Stopped (02/10/21 1427)   linezolid (ZYVOX) IV 600 mg (02/11/21 1001)   promethazine (PHENERGAN) injection (IM or IVPB) 6.25 mg (02/10/21 1851)   PRN Meds:.acetaminophen, ondansetron (ZOFRAN) IV, promethazine (PHENERGAN) injection (IM or IVPB)  Diet Orders (From admission, onward)     Start     Ordered   02/11/21 0103  Diet clear liquid Room service appropriate? Yes; Fluid consistency: Thin  Diet effective now       Question Answer Comment  Room service appropriate? Yes   Fluid consistency: Thin      02/11/21 0102            DVT prophylaxis: enoxaparin (LOVENOX) injection 40 mg Start: 02/10/21 1000     Code Status: Full Code  Family Communication: no family at bedside   Status is: Inpatient  Remains inpatient appropriate because:Inpatient level of care appropriate due to severity of  illness  Dispo: The patient is from: Home              Anticipated d/c is to: Home              Patient currently is not medically stable to d/c.   Difficult to place patient No  Level of care:  Progressive  Consultants:  ID  Procedures:  None   Microbiology  Blood culture 7/19-NGTD RT-PCR-negative  Antimicrobials: Cefepime, Zyvox 7/19 >>   Objective: Vitals:   02/10/21 1600 02/10/21 1618 02/10/21 2020 02/11/21 0459  BP: 130/65 118/67 (!) 79/52 (!) 83/55  Pulse:  (!) 139 (!) 133 (!) 101  Resp:  20 17 20   Temp:  (!) 100.6 F (38.1 C) 99.7 F (37.6 C) 99.1 F (37.3 C)  TempSrc:  Oral    SpO2:  97% 100% 98%  Weight:      Height:        Intake/Output Summary (Last 24 hours) at 02/11/2021 1007 Last data filed at 02/11/2021 [id number] Gross per 24 hour  Intake 2890.78 ml  Output 100 ml  Net 2790.78 ml    Filed Weights   02/09/21 2045  Weight: 79.4 kg    Examination:  Constitutional: No apparent distress, in bed Eyes: No scleral icterus noted ENMT: mmm Neck: normal, supple Respiratory: Clear to auscultation bilaterally, no wheezing or crackles heard, normal respiratory effort Cardiovascular: Regular rate and rhythm, no murmurs, no peripheral edema Abdomen: Soft, nontender, nondistended, bowel sounds positive Musculoskeletal: no clubbing / cyanosis.  Skin: No rashes seen Neurologic: Nonfocal   Data Reviewed: I have independently reviewed following labs and imaging studies  CBC: Recent Labs  Lab 02/09/21 1241 02/10/21 0316 02/11/21 0158  WBC 7.6 6.2 4.3  NEUTROABS 5.9  --   --   HGB 11.3* 11.2* 9.2*  HCT 35.6* 35.1* 27.7*  MCV 94.9 95.4 91.4  PLT 199 161 158    Basic Metabolic Panel: Recent Labs  Lab 02/09/21 1241 02/10/21 0316 02/11/21 0158  NA 130* 129* 129*  K 3.1* 3.5 3.5  CL 100 100 102  CO2 18* 19* 15*  GLUCOSE 170* 111* 86  BUN 20 17 25*  CREATININE 1.49* 1.23 2.62*  CALCIUM 8.0* 7.9* 7.3*  MG  --  1.9  --     Liver Function Tests: Recent Labs  Lab 02/09/21 1241 02/11/21 0158  AST 33 24  ALT 12 12  ALKPHOS 65 47  BILITOT 0.9 0.9  PROT 9.3* 6.9  ALBUMIN 2.9* 2.1*    Coagulation Profile: No results for input(s):  INR, PROTIME in the last 168 hours. HbA1C: No results for input(s): HGBA1C in the last 72 hours. CBG: No results for input(s): GLUCAP in the last 168 hours.  Recent Results (from the past 240 hour(s))  Resp Panel by RT-PCR (Flu A&B, Covid) Nasopharyngeal Swab     Status: None   Collection Time: 02/09/21 12:59 PM   Specimen: Nasopharyngeal Swab; Nasopharyngeal(NP) swabs in vial transport medium  Result Value Ref Range Status   SARS Coronavirus 2 by RT PCR NEGATIVE NEGATIVE Final    Comment: (NOTE) SARS-CoV-2 target nucleic acids are NOT DETECTED.  The SARS-CoV-2 RNA is generally detectable in upper respiratory specimens during the acute phase of infection. The lowest concentration of SARS-CoV-2 viral copies this assay can detect is 138 copies/mL. A negative result does not preclude SARS-Cov-2 infection and should not be used as the sole basis for treatment or other patient management decisions. A negative result may occur  with  improper specimen collection/handling, submission of specimen other than nasopharyngeal swab, presence of viral mutation(s) within the areas targeted by this assay, and inadequate number of viral copies(<138 copies/mL). A negative result must be combined with clinical observations, patient history, and epidemiological information. The expected result is Negative.  Fact Sheet for Patients:  BloggerCourse.com  Fact Sheet for Healthcare Providers:  SeriousBroker.it  This test is no t yet approved or cleared by the [place] FDA and  has been authorized for detection and/or diagnosis of SARS-CoV-2 by FDA under an Emergency Use Authorization (EUA). This EUA will remain  in effect (meaning this test can be used) for the duration of the COVID-19 declaration under Section 564(b)(1) of the Act, 21 U.S.C.section 360bbb-3(b)(1), unless the authorization is terminated  or revoked sooner.       Influenza A by  PCR NEGATIVE NEGATIVE Final   Influenza B by PCR NEGATIVE NEGATIVE Final    Comment: (NOTE) The Xpert Xpress SARS-CoV-2/FLU/RSV plus assay is intended as an aid in the diagnosis of influenza from Nasopharyngeal swab specimens and should not be used as a sole basis for treatment. Nasal washings and aspirates are unacceptable for Xpert Xpress SARS-CoV-2/FLU/RSV testing.  Fact Sheet for Patients: BloggerCourse.com  Fact Sheet for Healthcare Providers: SeriousBroker.it  This test is not yet approved or cleared by the [place] FDA and has been authorized for detection and/or diagnosis of SARS-CoV-2 by FDA under an Emergency Use Authorization (EUA). This EUA will remain in effect (meaning this test can be used) for the duration of the COVID-19 declaration under Section 564(b)(1) of the Act, 21 U.S.C. section 360bbb-3(b)(1), unless the authorization is terminated or revoked.  Performed at [facility], [address] [place], [place] [address]   Urine Culture     Status: Abnormal (Preliminary result)   Collection Time: 02/09/21  6:32 PM   Specimen: Urine, Clean Catch  Result Value Ref Range Status   Specimen Description URINE, CLEAN CATCH  Final   Special Requests   Final    Immunocompromised Performed at [facility], [address] [place], [place] [address]    Culture >=100,000 COLONIES/mL GRAM NEGATIVE RODS (A)  Final   Report Status PENDING  Incomplete  Blood culture (routine x 2)     Status: None (Preliminary result)   Collection Time: 02/09/21  6:35 PM   Specimen: BLOOD  Result Value Ref Range Status   Specimen Description BLOOD RIGHT ANTECUBITAL  Final   Special Requests   Final    BOTTLES DRAWN AEROBIC AND ANAEROBIC Blood Culture adequate volume   Culture   Final    NO GROWTH < 12 HOURS Performed at [facility], [address] [place], [place] [address]    Report Status PENDING  Incomplete   Blood culture (routine x 2)     Status: None (Preliminary result)   Collection Time: 02/09/21  6:35 PM   Specimen: BLOOD  Result Value Ref Range Status   Specimen Description BLOOD LEFT ANTECUBITAL  Final   Special Requests   Final    BOTTLES DRAWN AEROBIC AND ANAEROBIC Blood Culture adequate volume   Culture   Final    NO GROWTH < 12 HOURS Performed at [facility], [address], [place], [place] [address]    Report Status PENDING  Incomplete      Radiology Studies: MR BRAIN W WO CONTRAST  Result Date: 02/10/2021 CLINICAL DATA:  Transient ischemic attack. History of  HIV and treated syphilis fever, chills, headache and nausea. EXAM: MRI HEAD WITHOUT AND WITH CONTRAST TECHNIQUE: Multiplanar, multiecho pulse sequences of the brain and surrounding structures were obtained without and with intravenous contrast. CONTRAST:  7.65mL GADAVIST GADOBUTROL 1 MMOL/ML IV SOLN COMPARISON:  Head CT same day.  MRI 12/13/2017. FINDINGS: Brain: Diffusion imaging does not show any acute or subacute infarction. Brainstem and cerebellum are normal. Right cerebral hemispheres normal. There is old infarction in the medial left parietal lobe with atrophy, encephalomalacia and adjacent gliosis. No evidence of mass lesion, hemorrhage, hydrocephalus or extra-axial collection. After contrast administration, no abnormal brain or leptomeningeal enhancement occurs. Vascular: Major vessels at the base of the brain show flow. Skull and upper cervical spine: Negative Sinuses/Orbits: Mild mucosal thickening of the sinuses, most pronounced at the right maxillary sinus. No layering fluid. Orbits negative Other: None IMPRESSION: No acute intracranial finding. 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 No acute or subacute finding. No evidence of intracranial inflammatory disease. Mild sinus mucosal inflammatory changes, without advanced sinusitis. Electronically Signed   By: Paulina Fusi M.D.   On:  02/10/2021 13:22    Pamella Pert, MD, PhD [facility]  Between 7 am - 7 pm I am available, please contact me via Amion (for emergencies) or Securechat (non urgent messages)  Between 7 pm - 7 am I am not available, please contact night coverage MD/APP via Amion

## 2021-02-11 NOTE — Progress Notes (Signed)
[facility]  Date of Admission:  02/09/2021      Total days of antibiotics 2  Linezolid  Ceftriaxone   ASSESSMENT: Jacob Gutierrez is a 35 y.o. male with advanced uncontrolled HIV, AIDS+ here with fevers, nausea/vomiting and headache.   FUO in AIDS patient - Fevers improving since starting antibiotics. Cryptococcal Ag serum (-). MRI brain without leptomeningeal enhancement or mass effect. CT scan of abdomen/pelvis pending now. Headaches are only present with emesis episodes. Urine with high CFU GNR yet to be identified. Though he is not very symptomatic, will change to meropenem with ESBL history to target this. Stop linezolid. Blood cultures remain clear.   HIV/AIDS - RPR, HIV VL, genotype and CD4 pending. Continue symtuza for now. Will make sure he is getting bactrim only 3x weekly vs daily for now with AKI.   AKI - worsening today from 0.9 >> 2.62 acutely overnight. CT scan pending. Has been hypotensive with SBP in 80s, received 3L IVF. Would not suspect s/e to ABX or ART in this setting. Will see what scan shows. Urine with GNRs as above. No overt urinary symptoms.   Nausea / Vomiting - reviewed EGD results from March and only yield was esophageal candidiasis; at that time he did not have much going on in the oropharynx. Will start diflucan for him IV today on presumption this could be contributing. If no improvement suspect he will need repeat GI consultation for help.     PLAN: Change ceftriaxone to meropenem  Contact precautions for ESBL history  Stop linezolid IV diflucan  FU scan results    ADDENDUM: CT scan with enlarged R kidney and ureter and stranding c/w pyelonephritis. He does report some back pain but a little atypical given description of low pain. Nonetheless he has severely immunocompromised system and suspect he would not present typically. D/W Dr. Elvera Lennox and will FU NCA/P CT with renal ultrasound to further evaluate  obstruction with significant rise in creatinine. May need to get urology help.     Principal Problem:   SIRS (systemic inflammatory response syndrome) (HCC) Active Problems:   AIDS (acquired immune deficiency syndrome) (HCC)   Fever   Human immunodeficiency virus (HIV) disease (HCC)   Hyponatremia   Hypokalemia   AKI (acute kidney injury) (HCC)    Darunavir-Cobicisctat-Emtricitabine-Tenofovir Alafenamide  1 tablet Oral Q breakfast   enoxaparin (LOVENOX) injection  40 mg Subcutaneous Q24H   sulfamethoxazole-trimethoprim  1 tablet Oral Daily    SUBJECTIVE: Still feeling poorly today. "Just cannot keep anything down." Had one liquid/mushy bowel movement today. No belly pain or tenderness. No pain with swallowing but burns when he vomits. No blood in vomit only "stomach acid" He does describe some lower back pain that is intermittent, non-severe.   Review of Systems: Review of Systems  Constitutional:  Positive for malaise/fatigue. Negative for fever.  Eyes:  Negative for blurred vision and double vision.  Respiratory:  Positive for cough (very infrequent). Negative for shortness of breath.   Cardiovascular:  Negative for chest pain and leg swelling.  Gastrointestinal:  Positive for diarrhea, nausea and vomiting. Negative for abdominal pain.  Genitourinary:  Negative for dysuria, frequency, hematuria and urgency.  Musculoskeletal:  Positive for back pain. Negative for neck pain.  Skin:  Negative for rash.  Neurological:  Positive for headaches (only during emesis).    Allergies  Allergen Reactions   Vancomycin Anaphylaxis, Itching, Swelling and Other (See Comments)  Angioedema and "everything swells"   Amoxicillin Other (See Comments)    From childhood: "I had a reaction when i was little." (??)    OBJECTIVE: Vitals:   02/10/21 1618 02/10/21 2020 02/11/21 0459 02/11/21 1036  BP: 118/67 (!) 79/52 (!) 83/55 (!) 87/53  Pulse: (!) 139 (!) 133 (!) 101 100  Resp: 20 17 20 15    Temp: (!) 100.6 F (38.1 C) 99.7 F (37.6 C) 99.1 F (37.3 C) 98.2 F (36.8 C)  TempSrc: Oral   Oral  SpO2: 97% 100% 98% 99%  Weight:      Height:       Body mass index is 23.09 kg/m.  Physical Exam Constitutional:      Appearance: He is well-developed. He is ill-appearing. He is not toxic-appearing.  HENT:     Mouth/Throat:     Mouth: Mucous membranes are moist.     Pharynx: Oropharynx is clear.     Comments: No thrush noted  Neurological:     Mental Status: He is alert.    Lab Results Lab Results  Component Value Date   WBC 4.3 02/11/2021   HGB 9.2 (L) 02/11/2021   HCT 27.7 (L) 02/11/2021   MCV 91.4 02/11/2021   PLT 158 02/11/2021    Lab Results  Component Value Date   CREATININE 2.62 (H) 02/11/2021   BUN 25 (H) 02/11/2021   NA 129 (L) 02/11/2021   K 3.5 02/11/2021   CL 102 02/11/2021   CO2 15 (L) 02/11/2021    Lab Results  Component Value Date   ALT 12 02/11/2021   AST 24 02/11/2021   ALKPHOS 47 02/11/2021   BILITOT 0.9 02/11/2021     Microbiology: Recent Results (from the past 240 hour(s))  Resp Panel by RT-PCR (Flu A&B, Covid) Nasopharyngeal Swab     Status: None   Collection Time: 02/09/21 12:59 PM   Specimen: Nasopharyngeal Swab; Nasopharyngeal(NP) swabs in vial transport medium  Result Value Ref Range Status   SARS Coronavirus 2 by RT PCR NEGATIVE NEGATIVE Final    Comment: (NOTE) SARS-CoV-2 target nucleic acids are NOT DETECTED.  The SARS-CoV-2 RNA is generally detectable in upper respiratory specimens during the acute phase of infection. The lowest concentration of SARS-CoV-2 viral copies this assay can detect is 138 copies/mL. A negative result does not preclude SARS-Cov-2 infection and should not be used as the sole basis for treatment or other patient management decisions. A negative result may occur with  improper specimen collection/handling, submission of specimen other than nasopharyngeal swab, presence of viral mutation(s) within  the areas targeted by this assay, and inadequate number of viral copies(<138 copies/mL). A negative result must be combined with clinical observations, patient history, and epidemiological information. The expected result is Negative.  Fact Sheet for Patients:  02/11/21  Fact Sheet for Healthcare Providers:  BloggerCourse.com  This test is no t yet approved or cleared by the [employer] FDA and  has been authorized for detection and/or diagnosis of SARS-CoV-2 by FDA under an Emergency Use Authorization (EUA). This EUA will remain  in effect (meaning this test can be used) for the duration of the COVID-19 declaration under Section 564(b)(1) of the Act, 21 U.S.C.section 360bbb-3(b)(1), unless the authorization is terminated  or revoked sooner.       Influenza A by PCR NEGATIVE NEGATIVE Final   Influenza B by PCR NEGATIVE NEGATIVE Final    Comment: (NOTE) The Xpert Xpress SARS-CoV-2/FLU/RSV plus assay is intended as an aid in the  diagnosis of influenza from Nasopharyngeal swab specimens and should not be used as a sole basis for treatment. Nasal washings and aspirates are unacceptable for Xpert Xpress SARS-CoV-2/FLU/RSV testing.  Fact Sheet for Patients: BloggerCourse.com  Fact Sheet for Healthcare Providers: [employer]  This test is not yet approved or cleared by the [place] FDA and has been authorized for detection and/or diagnosis of SARS-CoV-2 by FDA under an Emergency Use Authorization (EUA). This EUA will remain in effect (meaning this test can be used) for the duration of the COVID-19 declaration under Section 564(b)(1) of the Act, 21 U.S.C. section 360bbb-3(b)(1), unless the authorization is terminated or revoked.  Performed at [facility], [address], [place], [place] [address]   Urine Culture     Status: Abnormal (Preliminary result)    Collection Time: 02/09/21  6:32 PM   Specimen: Urine, Clean Catch  Result Value Ref Range Status   Specimen Description URINE, CLEAN CATCH  Final   Special Requests Immunocompromised  Final   Culture (A)  Final    >=100,000 COLONIES/mL ESCHERICHIA COLI SUSCEPTIBILITIES TO FOLLOW Performed at [facility], [address], [place], [place] [address]    Report Status PENDING  Incomplete  Blood culture (routine x 2)     Status: None (Preliminary result)   Collection Time: 02/09/21  6:35 PM   Specimen: BLOOD  Result Value Ref Range Status   Specimen Description BLOOD RIGHT ANTECUBITAL  Final   Special Requests   Final    BOTTLES DRAWN AEROBIC AND ANAEROBIC Blood Culture adequate volume   Culture   Final    NO GROWTH < 12 HOURS Performed at [facility], [address], [place], [place] [address]    Report Status PENDING  Incomplete  Blood culture (routine x 2)     Status: None (Preliminary result)   Collection Time: 02/09/21  6:35 PM   Specimen: BLOOD  Result Value Ref Range Status   Specimen Description BLOOD LEFT ANTECUBITAL  Final   Special Requests   Final    BOTTLES DRAWN AEROBIC AND ANAEROBIC Blood Culture adequate volume   Culture   Final    NO GROWTH < 12 HOURS Performed at [facility], [address], [place], [place] [address]    Report Status PENDING  Incomplete    Rexene Alberts, MSN, NP-C [facility] Cell: [phone] Pager: [phone]  02/11/2021  11:32 AM

## 2021-02-11 NOTE — Consult Note (Signed)
Urology Consult   Physician requesting consult: Dr Lafe GarinGherge  Reason for consult: Pyelonephritis and acute kidney injury  History of Present Illness: Jacob Gutierrez is a 35 y.o. patient is a 35 year old African-American male previously followed by Dr. Marlou PorchHerrick in our practice with history of prostatic abscess requiring cystoscopic unroofing in 2019.  Patient has history of HIV/AIDS presented with fever acute kidney injury.  He has had CT urogram showing what appears to be some perinephric stranding on the right with mildly dilated right ureter but no evidence of obstructing stone and ureter can be visualized all the way to the bladder.  There is no significant caliectasis noted to suggest obvious back pressure effect.  Urine cultures positive for E. coli patient is on broad-spectrum antibiotics.  Urology now consulted for possible pyelonephritis, ? 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 [facility](HCC)    H/O coccidioidomycosis    pulmonary    HIV disease (HCC)    Past history of allergy to penicillin-type antibiotic 07-03-2012   desensitization   PNA (pneumonia)    Shigella gastroenteritis    Syphilis    history /treated     Past Surgical History:  Procedure Laterality Date   BIOPSY  10/16/2020   Procedure: BIOPSY;  Surgeon: Lemar LoftyMansouraty, Gabriel Jr., MD;  [facility];  Service: Gastroenterology;;   BIOPSY  10/23/2020   Procedure: BIOPSY;  Surgeon: Lynann BolognaGupta, Rajesh, MD;  [facility];  Service: Endoscopy;;   COLONOSCOPY WITH PROPOFOL N/A 10/23/2020   Procedure: COLONOSCOPY WITH PROPOFOL;  Surgeon: Lynann BolognaGupta, Rajesh, MD;  [facility];  Service: Endoscopy;  Laterality: N/A;   ESOPHAGOGASTRODUODENOSCOPY (EGD) WITH PROPOFOL N/A 10/16/2020   Procedure: ESOPHAGOGASTRODUODENOSCOPY (EGD) WITH  PROPOFOL;  Surgeon: Meridee ScoreMansouraty, Netty StarringGabriel Jr., MD;  [facility];  Service: Gastroenterology;  Laterality: N/A;   TRANSURETHRAL RESECTION OF PROSTATE N/A 12/09/2017   Procedure: TRANSURETHRAL RESECTION OF THE PROSTATE (TURP);  Surgeon: Crist FatHerrick, Benjamin W, MD;  Location: [facility] ORS;  Service: Urology;  Laterality: N/A;    Current Hospital Medications:  Home Meds:  No current facility-administered medications on file prior to encounter.   Current Outpatient Medications on File Prior to Encounter  Medication Sig Dispense Refill   Darunavir-Cobicisctat-Emtricitabine-Tenofovir Alafenamide (SYMTUZA) 800-150-200-10 MG TABS Take 1 tablet by mouth daily with breakfast. 30 tablet 0   sulfamethoxazole-trimethoprim (BACTRIM DS) 800-160 MG tablet TAKE 1 TABLET BY MOUTH DAILY. 30 tablet 0   azithromycin (ZITHROMAX) 500 MG tablet TAKE 1 TABLET (500 MG TOTAL) BY MOUTH DAILY FOR 7 DAYS. (Patient not taking: No sig reported) 7 tablet 0   Darunavir-Cobicisctat-Emtricitabine-Tenofovir Alafenamide (SYMTUZA) 800-150-200-10 MG TABS TAKE 1 TABLET BY MOUTH DAILY WITH BREAKFAST. (Patient not taking: No sig reported) 30 tablet 0   ferrous sulfate 324 (65 Fe) MG TBEC Take 1 tablet (325 mg total) by mouth daily after breakfast. (Patient not taking: No sig reported) 30 tablet 0   ferrous sulfate 325 (65 FE) MG tablet TAKE 1 TABLET (325 MG TOTAL) BY MOUTH DAILY AFTER BREAKFAST. (Patient not taking: No sig reported) 30 tablet 0   fluconazole (DIFLUCAN) 200 MG tablet TAKE 2 TABLETS (400 MG TOTAL) BY MOUTH DAILY FOR 13 DAYS. (Patient not taking: No sig reported) 26 tablet 0   lipase/protease/amylase (CREON) 36000 UNITS CPEP capsule Take 2 capsules (72,000 units) by mouth WITH every meal,  take 1 capsule (36,000 units) by mouth with every snack. (Patient not taking: No sig reported) 240 capsule 0   lipase/protease/amylase (CREON) 36000 UNITS CPEP capsule TAKE 2 CAPSULES (72,000 UNITS) BY MOUTH WITH EVERY MEAL, TAKE 1 CAPSULE  (36,000 UNITS) BY MOUTH WITH EVERY SNACK. (Patient not taking: No sig reported) 240 capsule 0   lipase/protease/amylase (CREON) 36000 UNITS CPEP capsule TAKE 2 CAPSULES (72,000 UNITS) BY MOUTH WITH EVERY MEAL, TAKE 1 CAPSULE (36,000 UNITS) BY MOUTH WITH EVERY SNACK. (Patient not taking: No sig reported) 240 capsule 11   loperamide (IMODIUM A-D) 2 MG tablet Take 1 tablet (2 mg total) by mouth every 8 (eight) hours. (Patient not taking: No sig reported) 30 tablet 5   loperamide (IMODIUM) 2 MG capsule TAKE 1 TABLET (2 MG TOTAL) BY MOUTH EVERY 8 (EIGHT) HOURS. (Patient not taking: No sig reported) 30 capsule 5   metoCLOPramide (REGLAN) 10 MG tablet TAKE 1 TABLET (10 MG TOTAL) BY MOUTH EVERY SIX HOURS. (Patient not taking: No sig reported) 30 tablet 0   nitrofurantoin, macrocrystal-monohydrate, (MACROBID) 100 MG capsule TAKE 1 CAPSULE BY MOUTH 2 TIMES DAILY FOR 7 DAYS (Patient not taking: No sig reported) 14 capsule 0   omeprazole (PRILOSEC) 20 MG capsule TAKE 1 CAPSULE (20 MG TOTAL) BY MOUTH 2 (TWO) TIMES DAILY BEFORE A MEAL. TAKE ONE CAPSULE 30-60 MINUTES BEFORE BREAKFAST AND DINNER (Patient not taking: No sig reported) 60 capsule 5   pantoprazole (PROTONIX) 40 MG tablet Take 1 tablet (40 mg total) by mouth 2 (two) times daily. (Patient not taking: No sig reported) 30 tablet 0   pantoprazole (PROTONIX) 40 MG tablet TAKE 1 TABLET (40 MG TOTAL) BY MOUTH 2 (TWO) TIMES DAILY. (Patient not taking: No sig reported) 30 tablet 0   potassium chloride (KLOR-CON) 10 MEQ tablet TAKE 2 TABLETS BY MOUTH DAILY. (Patient not taking: No sig reported) 60 tablet 1   potassium chloride SA (KLOR-CON) 20 MEQ tablet TAKE 1 TABLET (20 MEQ TOTAL) BY MOUTH TWO TIMES DAILY. (Patient not taking: No sig reported) 30 tablet 0   sulfamethoxazole-trimethoprim (BACTRIM) 400-80 MG tablet Take 1 tablet by mouth daily. (Patient not taking: No sig reported) 30 tablet 0     Scheduled Meds:  Darunavir-Cobicisctat-Emtricitabine-Tenofovir  Alafenamide  1 tablet Oral Q breakfast   enoxaparin (LOVENOX) injection  40 mg Subcutaneous Q24H   midodrine  10 mg Oral TID WC   [START ON 02/12/2021] sulfamethoxazole-trimethoprim  1 tablet Oral Once per day on Mon Wed Fri   Continuous Infusions:  sodium chloride 100 mL/hr at 02/11/21 1300   fluconazole (DIFLUCAN) IV 100 mg (02/11/21 1333)   meropenem (MERREM) IV 1 g (02/11/21 1557)   promethazine (PHENERGAN) injection (IM or IVPB) Stopped (02/11/21 1255)   PRN Meds:.acetaminophen, ondansetron (ZOFRAN) IV, promethazine (PHENERGAN) injection (IM or IVPB)  Allergies:  Allergies  Allergen Reactions   Vancomycin Anaphylaxis, Itching, Swelling and Other (See Comments)    Angioedema and "everything swells"   Amoxicillin Other (See Comments)    From childhood: "I had a reaction when i was little." (??)    Family History  Problem Relation Age of Onset   Hypertension Mother    Lupus Maternal Grandmother    Diabetes Maternal Grandmother    Cancer Paternal Grandfather        unknown    Prostate cancer Father    Diabetes Father    Diabetes Maternal Grandfather    Colon cancer Neg Hx    Stomach cancer Neg Hx  Esophageal cancer Neg Hx    Pancreatic cancer Neg Hx     Social History:  reports that he has been smoking cigarettes. He has been smoking an average of .3 packs per day. He has never used smokeless tobacco. He reports previous alcohol use of about 1.0 standard drink of alcohol per week. He reports previous drug use. Frequency: 1.00 time per week. Drug: Marijuana.  ROS: A complete review of systems was performed.  All systems are negative except for pertinent findings as noted.  Physical Exam:  Vital signs in last 24 hours: Temp:  [98.2 F (36.8 C)-99.7 F (37.6 C)] 98.2 F (36.8 C) (07/21 1036) Pulse Rate:  [100-133] 100 (07/21 1036) Resp:  [15-20] 15 (07/21 1036) BP: (79-93)/(52-55) 93/52 (07/21 1558) SpO2:  [98 %-100 %] 99 % (07/21 1036) Constitutional:  Alert and  oriented, No acute distress  GU: No CVA tenderness Lymphatic: No lymphadenopathy Neurologic: Grossly intact, no focal deficits Psychiatric: Normal mood and affect  Laboratory Data:  Recent Labs    02/09/21 1241 02/10/21 0316 02/11/21 0158  WBC 7.6 6.2 4.3  HGB 11.3* 11.2* 9.2*  HCT 35.6* 35.1* 27.7*  PLT 199 161 158    Recent Labs    02/09/21 1241 02/10/21 0316 02/11/21 0158  NA 130* 129* 129*  K 3.1* 3.5 3.5  CL 100 100 102  GLUCOSE 170* 111* 86  BUN 20 17 25*  CALCIUM 8.0* 7.9* 7.3*  CREATININE 1.49* 1.23 2.62*     Results for orders placed or performed during the hospital encounter of 02/09/21 (from the past 24 hour(s))  CBC     Status: Abnormal   Collection Time: 02/11/21  1:58 AM  Result Value Ref Range   WBC 4.3 4.0 - 10.5 K/uL   RBC 3.03 (L) 4.22 - 5.81 MIL/uL   Hemoglobin 9.2 (L) 13.0 - 17.0 g/dL   HCT 78.2 (L) 95.6 - 21.3 %   MCV 91.4 80.0 - 100.0 fL   MCH 30.4 26.0 - 34.0 pg   MCHC 33.2 30.0 - 36.0 g/dL   RDW 08.6 (H) 57.8 - 46.9 %   Platelets 158 150 - 400 K/uL   nRBC 0.0 0.0 - 0.2 %  Comprehensive metabolic panel     Status: Abnormal   Collection Time: 02/11/21  1:58 AM  Result Value Ref Range   Sodium 129 (L) 135 - 145 mmol/L   Potassium 3.5 3.5 - 5.1 mmol/L   Chloride 102 98 - 111 mmol/L   CO2 15 (L) 22 - 32 mmol/L   Glucose, Bld 86 70 - 99 mg/dL   BUN 25 (H) 6 - 20 mg/dL   Creatinine, Ser 6.29 (H) 0.61 - 1.24 mg/dL   Calcium 7.3 (L) 8.9 - 10.3 mg/dL   Total Protein 6.9 6.5 - 8.1 g/dL   Albumin 2.1 (L) 3.5 - 5.0 g/dL   AST 24 15 - 41 U/L   ALT 12 0 - 44 U/L   Alkaline Phosphatase 47 38 - 126 U/L   Total Bilirubin 0.9 0.3 - 1.2 mg/dL   GFR, Estimated 31 (L) >60 mL/min   Anion gap 12 5 - 15  Troponin I (High Sensitivity)     Status: Abnormal   Collection Time: 02/11/21  1:58 AM  Result Value Ref Range   Troponin I (High Sensitivity) 44 (H) <18 ng/L  Troponin I (High Sensitivity)     Status: Abnormal   Collection Time: 02/11/21  3:21  AM  Result Value Ref Range  Troponin I (High Sensitivity) 46 (H) <18 ng/L  Cortisol-am, blood     Status: Abnormal   Collection Time: 02/11/21  6:44 AM  Result Value Ref Range   Cortisol - AM 23.6 (H) 6.7 - 22.6 ug/dL  Urinalysis, Routine w reflex microscopic Urine, Catheterized     Status: Abnormal   Collection Time: 02/11/21  5:46 PM  Result Value Ref Range   Color, Urine YELLOW YELLOW   APPearance HAZY (A) CLEAR   Specific Gravity, Urine 1.013 1.005 - 1.030   pH 6.0 5.0 - 8.0   Glucose, UA NEGATIVE NEGATIVE mg/dL   Hgb urine dipstick SMALL (A) NEGATIVE   Bilirubin Urine NEGATIVE NEGATIVE   Ketones, ur NEGATIVE NEGATIVE mg/dL   Protein, ur 30 (A) NEGATIVE mg/dL   Nitrite NEGATIVE NEGATIVE   Leukocytes,Ua SMALL (A) NEGATIVE   RBC / HPF 0-5 0 - 5 RBC/hpf   WBC, UA 21-50 0 - 5 WBC/hpf   Bacteria, UA FEW (A) NONE SEEN   Squamous Epithelial / LPF 0-5 0 - 5   Mucus PRESENT    Recent Results (from the past 240 hour(s))  Resp Panel by RT-PCR (Flu A&B, Covid) Nasopharyngeal Swab     Status: None   Collection Time: 02/09/21 12:59 PM   Specimen: Nasopharyngeal Swab; Nasopharyngeal(NP) swabs in vial transport medium  Result Value Ref Range Status   SARS Coronavirus 2 by RT PCR NEGATIVE NEGATIVE Final    Comment: (NOTE) SARS-CoV-2 target nucleic acids are NOT DETECTED.  The SARS-CoV-2 RNA is generally detectable in upper respiratory specimens during the acute phase of infection. The lowest concentration of SARS-CoV-2 viral copies this assay can detect is 138 copies/mL. A negative result does not preclude SARS-Cov-2 infection and should not be used as the sole basis for treatment or other patient management decisions. A negative result may occur with  improper specimen collection/handling, submission of specimen other than nasopharyngeal swab, presence of viral mutation(s) within the areas targeted by this assay, and inadequate number of viral copies(<138 copies/mL). A negative  result must be combined with clinical observations, patient history, and epidemiological information. The expected result is Negative.  Fact Sheet for Patients:  BloggerCourse.com  Fact Sheet for Healthcare Providers:  SeriousBroker.it  This test is no t yet approved or cleared by the [place] FDA and  has been authorized for detection and/or diagnosis of SARS-CoV-2 by FDA under an Emergency Use Authorization (EUA). This EUA will remain  in effect (meaning this test can be used) for the duration of the COVID-19 declaration under Section 564(b)(1) of the Act, 21 U.S.C.section 360bbb-3(b)(1), unless the authorization is terminated  or revoked sooner.       Influenza A by PCR NEGATIVE NEGATIVE Final   Influenza B by PCR NEGATIVE NEGATIVE Final    Comment: (NOTE) The Xpert Xpress SARS-CoV-2/FLU/RSV plus assay is intended as an aid in the diagnosis of influenza from Nasopharyngeal swab specimens and should not be used as a sole basis for treatment. Nasal washings and aspirates are unacceptable for Xpert Xpress SARS-CoV-2/FLU/RSV testing.  Fact Sheet for Patients: BloggerCourse.com  Fact Sheet for Healthcare Providers: SeriousBroker.it  This test is not yet approved or cleared by the [place] FDA and has been authorized for detection and/or diagnosis of SARS-CoV-2 by FDA under an Emergency Use Authorization (EUA). This EUA will remain in effect (meaning this test can be used) for the duration of the COVID-19 declaration under Section 564(b)(1) of the Act, 21 U.S.C. section 360bbb-3(b)(1), unless the authorization  is terminated or revoked.  Performed at [facility], [address], [place], [place] [address]   Urine Culture     Status: Abnormal (Preliminary result)   Collection Time: 02/09/21  6:32 PM   Specimen: Urine, Clean Catch  Result Value Ref Range Status    Specimen Description URINE, CLEAN CATCH  Final   Special Requests Immunocompromised  Final   Culture (A)  Final    >=100,000 COLONIES/mL ESCHERICHIA COLI SUSCEPTIBILITIES TO FOLLOW Performed at [facility], [address], [place], [place] [address]    Report Status PENDING  Incomplete  Blood culture (routine x 2)     Status: None (Preliminary result)   Collection Time: 02/09/21  6:35 PM   Specimen: BLOOD  Result Value Ref Range Status   Specimen Description BLOOD RIGHT ANTECUBITAL  Final   Special Requests   Final    BOTTLES DRAWN AEROBIC AND ANAEROBIC Blood Culture adequate volume   Culture   Final    NO GROWTH 2 DAYS Performed at [facility], [address] [place], [place] [address]    Report Status PENDING  Incomplete  Blood culture (routine x 2)     Status: None (Preliminary result)   Collection Time: 02/09/21  6:35 PM   Specimen: BLOOD  Result Value Ref Range Status   Specimen Description BLOOD LEFT ANTECUBITAL  Final   Special Requests   Final    BOTTLES DRAWN AEROBIC AND ANAEROBIC Blood Culture adequate volume   Culture   Final    NO GROWTH 2 DAYS Performed at [facility], [address], [place], [place] [address]    Report Status PENDING  Incomplete    Renal Function: Recent Labs    02/09/21 1241 02/10/21 0316 02/11/21 0158  CREATININE 1.49* 1.23 2.62*   Estimated Creatinine Clearance: 43.4 mL/min (A) (by C-G formula based on SCr of 2.62 mg/dL (H)).  Radiologic Imaging: CT ABDOMEN PELVIS WO CONTRAST  Result Date: 02/11/2021 CLINICAL DATA:  Diffuse abdominal pain with vomiting. EXAM: CT ABDOMEN AND PELVIS WITHOUT CONTRAST TECHNIQUE: Multidetector CT imaging of the abdomen and pelvis was performed following the standard protocol without IV contrast. COMPARISON:  10/15/2020 FINDINGS: Lower chest: Interval development of ground-glass and collapse/consolidative opacity in the dependent lung bases bilaterally, right greater than left.  Findings probably reflect atelectasis with superimposed infection cannot be entirely excluded. Hepatobiliary: No focal abnormality in the liver on this study without intravenous contrast. There is no evidence for gallstones, gallbladder wall thickening, or pericholecystic fluid. No intrahepatic or extrahepatic biliary dilation. Pancreas: No focal mass lesion. No dilatation of the main duct. No intraparenchymal cyst. No peripancreatic edema. Spleen: No splenomegaly. No focal mass lesion. Adrenals/Urinary Tract: No adrenal nodule or mass. Right kidney appears edematous with fullness of the intrarenal collecting system and proximal right ureter. Subtle changes of proximal periureteric edema noted. Left kidney unremarkable on noncontrast imaging. Urine in the bladder has increased attenuation, likely reflecting excreted gadolinium contrast from brain MRI yesterday. Stomach/Bowel: Small hiatal hernia with mild circumferential wall thickening noted distal esophagus stomach otherwise unremarkable. No small bowel wall thickening. No small bowel dilatation. Neither the terminal ileum nor the appendix are discretely visible on this study without oral or intravenous contrast material. Colon is nondistended. Vascular/Lymphatic: No abdominal aortic aneurysm. There is no gastrohepatic or hepatoduodenal ligament lymphadenopathy. Para-aortic retroperitoneal lymphadenopathy again noted, similar to prior. 12 mm short axis left para-aortic node on 46/3 was 10 mm previously. Reproductive: The prostate  gland and seminal vesicles are unremarkable. Other: No intraperitoneal free fluid. Musculoskeletal: No worrisome lytic or sclerotic osseous abnormality. IMPRESSION: 1. Interval development of ground-glass and collapse/consolidative opacity in the dependent lung bases bilaterally, right greater than left. Findings probably reflect atelectasis with superimposed infection cannot be entirely excluded. 2. Right kidney appears enlarged and  edematous with peripelvic and proximal periureteric edema. Right intrarenal collecting system and proximal right ureter are distended. Assessment limited by the lack of intravenous contrast material, but given relatively normal appearance on CT from 4 months ago and lack of urinary stone disease, imaging features may be related to pyelonephritis. 3. Similar appearance of retroperitoneal lymphadenopathy in this patient with reported history of HIV. 4. Small hiatal hernia with mild circumferential wall thickening distal esophagus. Esophagitis could have this appearance. Electronically Signed   By: Kennith Center M.D.   On: 02/11/2021 10:57   CT Head Wo Contrast  Result Date: 02/09/2021 CLINICAL DATA:  Headache for 2 days, emesis EXAM: CT HEAD WITHOUT CONTRAST TECHNIQUE: Contiguous axial images were obtained from the base of the skull through the vertex without intravenous contrast. COMPARISON:  09/20/2018 FINDINGS: Brain: Chronic encephalomalacia within the left parietal region unchanged. No signs of acute infarct or hemorrhage. Lateral ventricles and midline structures are unremarkable. No acute extra-axial fluid collections. No mass effect. Vascular: No hyperdense vessel or unexpected calcification. Skull: Normal. Negative for fracture or focal lesion. Sinuses/Orbits: Mild mucosal thickening of the ethmoid and frontal sinuses. No gas fluid levels. Other: None. IMPRESSION: 1. Mild ethmoid and frontal sinus disease. 2. Stable chronic encephalomalacia left parietal region. No acute process. Electronically Signed   By: Sharlet Salina M.D.   On: 02/09/2021 20:15   MR BRAIN W WO CONTRAST  Result Date: 02/10/2021 CLINICAL DATA:  Transient ischemic attack. History of HIV and treated syphilis fever, chills, headache and nausea. EXAM: MRI HEAD WITHOUT AND WITH CONTRAST TECHNIQUE: Multiplanar, multiecho pulse sequences of the brain and surrounding structures were obtained without and with intravenous contrast. CONTRAST:   7.44mL GADAVIST GADOBUTROL 1 MMOL/ML IV SOLN COMPARISON:  Head CT same day.  MRI 12/13/2017. FINDINGS: Brain: Diffusion imaging does not show any acute or subacute infarction. Brainstem and cerebellum are normal. Right cerebral hemispheres normal. There is old infarction in the medial left parietal lobe with atrophy, encephalomalacia and adjacent gliosis. No evidence of mass lesion, hemorrhage, hydrocephalus or extra-axial collection. After contrast administration, no abnormal brain or leptomeningeal enhancement occurs. Vascular: Major vessels at the base of the brain show flow. Skull and upper cervical spine: Negative Sinuses/Orbits: Mild mucosal thickening of the sinuses, most pronounced at the right maxillary sinus. No layering fluid. Orbits negative Other: None IMPRESSION: No acute intracranial finding. 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 No acute or subacute finding. No evidence of intracranial inflammatory disease. Mild sinus mucosal inflammatory changes, without advanced sinusitis. Electronically Signed   By: Paulina Fusi M.D.   On: 02/10/2021 13:22    I independently reviewed the above imaging studies.  Impression/Recommendation Fever/UTI and probable pyelonephritis based on recent CT scan.  Has some dilatation of the ureter and stranding suggestive of infectious process.  Also acute kidney injury with worsening renal function since admission. Plan/recommendation.  Discussed with hospitalist placement of Foley catheter to promote urinary drainage.  We will follow creatinine and if continuously worsening of renal function we will proceed with cystoscopy insertion of right JJ stent.  No definitive caliectasis to suggest definitive obstruction.  No obvious abscess on CT but not a  contrasted study so some what limited.  We will follow closely.  Make n.p.o. after midnight in case stent needed tomorrow.  Belva Agee 02/11/2021, 6:12 PM       CC:

## 2021-02-12 DIAGNOSIS — B370 Candidal stomatitis: Secondary | ICD-10-CM

## 2021-02-12 DIAGNOSIS — R1114 Bilious vomiting: Secondary | ICD-10-CM | POA: Diagnosis not present

## 2021-02-12 DIAGNOSIS — B20 Human immunodeficiency virus [HIV] disease: Secondary | ICD-10-CM | POA: Diagnosis not present

## 2021-02-12 DIAGNOSIS — R6510 Systemic inflammatory response syndrome (SIRS) of non-infectious origin without acute organ dysfunction: Secondary | ICD-10-CM | POA: Diagnosis not present

## 2021-02-12 DIAGNOSIS — N179 Acute kidney failure, unspecified: Secondary | ICD-10-CM | POA: Diagnosis not present

## 2021-02-12 DIAGNOSIS — N12 Tubulo-interstitial nephritis, not specified as acute or chronic: Secondary | ICD-10-CM

## 2021-02-12 DIAGNOSIS — E876 Hypokalemia: Secondary | ICD-10-CM

## 2021-02-12 DIAGNOSIS — N139 Obstructive and reflux uropathy, unspecified: Secondary | ICD-10-CM

## 2021-02-12 DIAGNOSIS — Z9119 Patient's noncompliance with other medical treatment and regimen: Secondary | ICD-10-CM

## 2021-02-12 DIAGNOSIS — N19 Unspecified kidney failure: Secondary | ICD-10-CM

## 2021-02-12 DIAGNOSIS — Z1612 Extended spectrum beta lactamase (ESBL) resistance: Secondary | ICD-10-CM

## 2021-02-12 DIAGNOSIS — A499 Bacterial infection, unspecified: Secondary | ICD-10-CM

## 2021-02-12 LAB — BASIC METABOLIC PANEL
Anion gap: 7 (ref 5–15)
BUN: 25 mg/dL — ABNORMAL HIGH (ref 6–20)
CO2: 17 mmol/L — ABNORMAL LOW (ref 22–32)
Calcium: 7.7 mg/dL — ABNORMAL LOW (ref 8.9–10.3)
Chloride: 106 mmol/L (ref 98–111)
Creatinine, Ser: 1.77 mg/dL — ABNORMAL HIGH (ref 0.61–1.24)
GFR, Estimated: 50 mL/min — ABNORMAL LOW (ref >60)
Glucose, Bld: 90 mg/dL (ref 70–99)
Potassium: 3.7 mmol/L (ref 3.5–5.1)
Sodium: 130 mmol/L — ABNORMAL LOW (ref 135–145)

## 2021-02-12 LAB — ACID FAST SMEAR (AFB, MYCOBACTERIA): Acid Fast Smear: NEGATIVE

## 2021-02-12 LAB — CBC
HCT: 26 % — ABNORMAL LOW (ref 39.0–52.0)
Hemoglobin: 8.9 g/dL — ABNORMAL LOW (ref 13.0–17.0)
MCH: 30.8 pg (ref 26.0–34.0)
MCHC: 34.2 g/dL (ref 30.0–36.0)
MCV: 90 fL (ref 80.0–100.0)
Platelets: 170 10*3/uL (ref 150–400)
RBC: 2.89 MIL/uL — ABNORMAL LOW (ref 4.22–5.81)
RDW: 15.7 % — ABNORMAL HIGH (ref 11.5–15.5)
WBC: 4.2 10*3/uL (ref 4.0–10.5)
nRBC: 0 % (ref 0.0–0.2)

## 2021-02-12 LAB — URINE CULTURE: Culture: >=100000 — AB

## 2021-02-12 LAB — T-HELPER CELLS (CD4) COUNT (NOT AT ARMC)
CD4 % Helper T Cell: 3 % — ABNORMAL LOW (ref 33–65)
CD4 T Cell Abs: <35 /uL — ABNORMAL LOW (ref 400–1790)

## 2021-02-12 MED ORDER — SODIUM CHLORIDE 0.9 % IV SOLN
1.0000 g | Freq: Three times a day (TID) | INTRAVENOUS | Status: DC
  Administered 2021-02-12 – 2021-02-15 (×10): 1 g via INTRAVENOUS
  Filled 2021-02-12 (×12): qty 1

## 2021-02-12 NOTE — Progress Notes (Signed)
[facility]  Date of Admission:  02/09/2021      Total days of antibiotics 3  Meropenem 7/21 >> current    ASSESSMENT: Jacob Gutierrez is a 35 y.o. male with advanced uncontrolled HIV, AIDS+ here with fevers, nausea/vomiting and headache.   Pyelonephritis - seems to be the explanation of his fevers and current illness, though no typical GU symptoms. Foley catheter in place and renal US seems improved compared to CT scan. Urology following for possible stent placement, however Cr better today so hopeful can avoid that. ESBL producing E coli confirmed - will continue IV meropenem. Will need to stay for duration of treatment given unstable housing situation and poor candidate for home IV treatments.   HIV/AIDS - RPR, HIV VL, genotype and CD4 pending. Continue symtuza and bactrim prophylaxis. Hold on MAC prophylaxis also for now until AFB return.   AKI - Cr 0.9 >> 2.62 >> 1.66 today, improving after foley placement and change in abx. No changes needed to ART.   Nausea / Vomiting - seems improved and he is hungry - NPO until urology clears him for needing stent.   Chest pain, anterior under left ribs - CT scan mentions possible esophagitis and will continue fluconazole for now. ?musculoskeletal d/t frequent emesis.  D/W Dr. Cruzita Lederer.     PLAN: Continue meropenem  Contact precautions for hospitalization  Continue IV diflucan - can change to PO once he tolerates diet.    Dr. Linus Salmons is available over the weekend.      Principal Problem:   SIRS (systemic inflammatory response syndrome) (HCC) Active Problems:   AIDS (acquired immune deficiency syndrome) (HCC)   Fever   Human immunodeficiency virus (HIV) disease (HCC)   Hyponatremia   Pyelonephritis   Hypokalemia   AKI (acute kidney injury) (HCC)   Bilious vomiting with nausea   Obstructive uropathy   Prerenal renal failure   Nonadherence to medical treatment    Chlorhexidine Gluconate  Cloth  6 each Topical Daily   Darunavir-Cobicisctat-Emtricitabine-Tenofovir Alafenamide  1 tablet Oral Q breakfast   enoxaparin (LOVENOX) injection  40 mg Subcutaneous Q24H   midodrine  10 mg Oral TID WC   sulfamethoxazole-trimethoprim  1 tablet Oral Once per day on Mon Wed Fri    SUBJECTIVE: He is very hungry today. No headaches, cough, fevers/chills.    Review of Systems: Review of Systems  Constitutional:  Positive for malaise/fatigue. Negative for chills and fever.  HENT:  Negative for sore throat.   Eyes:  Negative for blurred vision and double vision.  Respiratory:  Negative for cough and shortness of breath.   Cardiovascular:  Positive for chest pain. Negative for leg swelling.  Gastrointestinal:  Positive for heartburn. Negative for abdominal pain, nausea and vomiting.  Genitourinary:  Negative for dysuria and flank pain.  Musculoskeletal:  Negative for joint pain and myalgias.  Skin:  Negative for rash.  Neurological:  Negative for dizziness and headaches.    Allergies  Allergen Reactions   Vancomycin Anaphylaxis, Itching, Swelling and Other (See Comments)    Angioedema and "everything swells"   Amoxicillin Other (See Comments)    From childhood: "I had a reaction when i was little." (??)    OBJECTIVE: Vitals:   02/11/21 1929 02/12/21 0011 02/12/21 0357 02/12/21 0748  BP: 97/65 99/61 103/68 99/60  Pulse: 89 95 90 84  Resp: _0 Temp: 98 F (36.7 C) 98.6 F (37 C)  99.2 F (37.3 C) 98.2 F (36.8 C)  TempSrc:  Oral Oral   SpO2: 99% 98% 98% 98%  Weight:      Height:       Body mass index is 23.09 kg/m.   Physical Exam Vitals reviewed.  Constitutional:      Appearance: He is well-developed. He is ill-appearing. He is not toxic-appearing.  HENT:     Mouth/Throat:     Mouth: Mucous membranes are moist.     Pharynx: Oropharynx is clear.  Cardiovascular:     Rate and Rhythm: Normal rate and regular rhythm.     Heart sounds: Normal heart sounds.   Pulmonary:     Effort: Pulmonary effort is normal.     Breath sounds: Normal breath sounds.  Chest:       Comments: +Tenderness with palpation  Abdominal:     General: Bowel sounds are normal.  Musculoskeletal:     Cervical back: Normal range of motion.  Neurological:     Mental Status: He is alert and oriented to person, place, and time.    Lab Results Lab Results  Component Value Date   WBC 4.2 02/12/2021   HGB 8.9 (L) 02/12/2021   HCT 26.0 (L) 02/12/2021   MCV 90.0 02/12/2021   PLT 170 02/12/2021    Lab Results  Component Value Date   CREATININE 1.77 (H) 02/12/2021   BUN 25 (H) 02/12/2021   NA 130 (L) 02/12/2021   K 3.7 02/12/2021   CL 106 02/12/2021   CO2 17 (L) 02/12/2021    Lab Results  Component Value Date   ALT 12 02/11/2021   AST 24 02/11/2021   ALKPHOS 47 02/11/2021   BILITOT 0.9 02/11/2021     Microbiology: Recent Results (from the past 240 hour(s))  Resp Panel by RT-PCR (Flu A&B, Covid) Nasopharyngeal Swab     Status: None   Collection Time: 02/09/21 12:59 PM   Specimen: Nasopharyngeal Swab; Nasopharyngeal(NP) swabs in vial transport medium  Result Value Ref Range Status   SARS Coronavirus 2 by RT PCR NEGATIVE NEGATIVE Final    Comment: (NOTE) SARS-CoV-2 target nucleic acids are NOT DETECTED.  The SARS-CoV-2 RNA is generally detectable in upper respiratory specimens during the acute phase of infection. The lowest concentration of SARS-CoV-2 viral copies this assay can detect is 138 copies/mL. A negative result does not preclude SARS-Cov-2 infection and should not be used as the sole basis for treatment or other patient management decisions. A negative result may occur with  improper specimen collection/handling, submission of specimen other than nasopharyngeal swab, presence of viral mutation(s) within the areas targeted by this assay, and inadequate number of viral copies(<138 copies/mL). A negative result must be combined with clinical  observations, patient history, and epidemiological information. The expected result is Negative.  Fact Sheet for Patients:  EntrepreneurPulse.com.au  Fact Sheet for Healthcare Providers:  IncredibleEmployment.be  This test is no t yet approved or cleared by the [place] FDA and  has been authorized for detection and/or diagnosis of SARS-CoV-2 by FDA under an Emergency Use Authorization (EUA). This EUA will remain  in effect (meaning this test can be used) for the duration of the COVID-19 declaration under Section 564(b)(1) of the Act, 21 U.S.C.section 360bbb-3(b)(1), unless the authorization is terminated  or revoked sooner.       Influenza A by PCR NEGATIVE NEGATIVE Final   Influenza B by PCR NEGATIVE NEGATIVE Final    Comment: (NOTE) The Xpert Xpress SARS-CoV-2/FLU/RSV plus assay  is intended as an aid in the diagnosis of influenza from Nasopharyngeal swab specimens and should not be used as a sole basis for treatment. Nasal washings and aspirates are unacceptable for Xpert Xpress SARS-CoV-2/FLU/RSV testing.  Fact Sheet for Patients: EntrepreneurPulse.com.au  Fact Sheet for Healthcare Providers: IncredibleEmployment.be  This test is not yet approved or cleared by the [place] FDA and has been authorized for detection and/or diagnosis of SARS-CoV-2 by FDA under an Emergency Use Authorization (EUA). This EUA will remain in effect (meaning this test can be used) for the duration of the COVID-19 declaration under Section 564(b)(1) of the Act, 21 U.S.C. section 360bbb-3(b)(1), unless the authorization is terminated or revoked.  Performed at [facility], [place] [address], [place], [place] [address]   Urine Culture     Status: Abnormal   Collection Time: 02/09/21  6:32 PM   Specimen: Urine, Clean Catch  Result Value Ref Range Status   Specimen Description URINE, CLEAN CATCH  Final    Special Requests   Final    Immunocompromised Performed at [facility], [place] [address], [place], [place] [address]    Culture (A)  Final    >=100,000 COLONIES/mL ESCHERICHIA COLI Confirmed Extended Spectrum Beta-Lactamase Producer (ESBL).  In bloodstream infections from ESBL organisms, carbapenems are preferred over piperacillin/tazobactam. They are shown to have a lower risk of mortality.    Report Status 02/12/2021 FINAL  Final   Organism ID, Bacteria ESCHERICHIA COLI (A)  Final      Susceptibility   Escherichia coli - MIC*    AMPICILLIN >=32 RESISTANT Resistant     CEFAZOLIN >=64 RESISTANT Resistant     CEFEPIME >=32 RESISTANT Resistant     CEFTRIAXONE >=64 RESISTANT Resistant     CIPROFLOXACIN >=4 RESISTANT Resistant     GENTAMICIN >=16 RESISTANT Resistant     IMIPENEM <=0.25 SENSITIVE Sensitive     NITROFURANTOIN <=16 SENSITIVE Sensitive     TRIMETH/SULFA >=320 RESISTANT Resistant     AMPICILLIN/SULBACTAM >=32 RESISTANT Resistant     PIP/TAZO 8 SENSITIVE Sensitive     * >=100,000 COLONIES/mL ESCHERICHIA COLI  Blood culture (routine x 2)     Status: None (Preliminary result)   Collection Time: 02/09/21  6:35 PM   Specimen: BLOOD  Result Value Ref Range Status   Specimen Description BLOOD RIGHT ANTECUBITAL  Final   Special Requests   Final    BOTTLES DRAWN AEROBIC AND ANAEROBIC Blood Culture adequate volume   Culture   Final    NO GROWTH 3 DAYS Performed at [facility], [address], [place], [place] [address]    Report Status PENDING  Incomplete  Blood culture (routine x 2)     Status: None (Preliminary result)   Collection Time: 02/09/21  6:35 PM   Specimen: BLOOD  Result Value Ref Range Status   Specimen Description BLOOD LEFT ANTECUBITAL  Final   Special Requests   Final    BOTTLES DRAWN AEROBIC AND ANAEROBIC Blood Culture adequate volume   Culture   Final    NO GROWTH 3 DAYS Performed at [facility], [place] [address] [place], [place]  [address]    Report Status PENDING  Incomplete  Acid Fast Smear (AFB)     Status: None   Collection Time: 02/10/21 11:25 PM   Specimen: Vein; Blood  Result Value Ref Range Status   AFB Specimen Processing Concentration  Final   Acid Fast Smear Negative  Final  Comment: (NOTE) Performed At: [facility] [place], [place] [id number] Rush Farmer MD NL:[id number]    Source (AFB) SPUTUM  Final    Comment: Performed at [facility], [place] [address] [place], [place] [address]    Janene Madeira, MSN, NP-C [facility] Cell: [phone] Pager: [phone]  02/12/2021  11:10 AM

## 2021-02-12 NOTE — Progress Notes (Signed)
PHARMACY NOTE:  ANTIMICROBIAL RENAL DOSAGE ADJUSTMENT  Current antimicrobial regimen includes a mismatch between antimicrobial dosage and estimated renal function.  As per policy approved by the Pharmacy & Therapeutics and Medical Executive Committees, the antimicrobial dosage will be adjusted accordingly.  Current antimicrobial dosage:  Meropenem 1g q12h  Indication: pyelonephritis  Renal Function:  Estimated Creatinine Clearance: 64.2 mL/min (A) (by C-G formula based on SCr of 1.77 mg/dL (H)). []      On intermittent HD, scheduled: []      On CRRT    Antimicrobial dosage has been changed to:  Meropenem 1g q8h  Additional comments: Scr improved since initial order.    Thank you for allowing pharmacy to be a part of this patient's care.  , PharmD Pharmacy Resident 02/12/2021, 11:29 AM

## 2021-02-12 NOTE — Progress Notes (Signed)
PROGRESS NOTE  Jacob Gutierrez OQH:[id number] DOB: 1985-12-09 DOA: 02/09/2021 PCP: Patient, No Pcp Per (Inactive)   LOS: 3 days   Brief Narrative / Interim history: 35 year old male with HIV/AIDS (CD4 less than 35 and viral load 978K in March), nonadherence to anti-HIV regimen, history of treated syphilis who was admitted to the hospital with fever, chills, headaches, nausea and vomiting.  He was doing fairly well up until the day prior to admission and all of a sudden felt quite sick and came to the hospital.  He was febrile in the ED to 39.4, tachycardic, chest x-ray was unremarkable.  Head CT showed stable chronic encephalomalacia without anything acute, UDS showed amphetamines and THC.  COVID-19 was negative.  He was placed on broad-spectrum antibiotics and was admitted to the hospital.  Patient refused an LP  Subjective / 24h Interval events: Feeling better this morning, complains of upper abdominal pain but it is more of a soreness.  No further nausea and vomiting, wants to try something to eat  Assessment & Plan: Principal Problem Severe sepsis due to ESBL E. coli pyelonephritis-patient with significant immunosuppression in the setting of nonadherence with antiretroviral therapy.  He was placed on broad-spectrum antibiotics and infectious disease was consulted -Due to headache and fever there was concern for meningitis but he has been refusing LP.  An MRI of the brain was negative for leptomeningeal enhancement -Due to persistent symptoms underwent a CT scan of the abdomen and pelvis yesterday which showed findings concerning for right-sided pyelonephritis.   -RPR reactive, HIV RNA pending, CD4 pending, blood AFP and cultures pending -Cryptococcal antigen negative, toxoplasma antibodies negative -Due to persistent hypotension-started on midodrine 7/21.  Cortisol was checked and it was appropriately elevated.  Blood pressure has improved  Active Problems Acute kidney injury-creatinine  on admission 1.49 from normal values in the past.  Creatinine initially improved yesterday however it significantly worsened to 2.5 on 7/21.  Due to concern for mild hydronephrosis on the CT scan urology consulted, recommended Foley catheter and potential stent based on clinical course -Creatinine improving today, does not need stent, continue to monitor ins and outs  Hypokalemia, hyponatremia-possibly in the setting of GI losses with nausea, vomiting.  Continue fluids.  We will allow a diet today  AIDS-he says he takes his medications "most of the time".  I suspect a degree of nonadherence  Substance use-UDS positive for amphetamines, THC   Scheduled Meds:  Chlorhexidine Gluconate Cloth  6 each Topical Daily   Darunavir-Cobicisctat-Emtricitabine-Tenofovir Alafenamide  1 tablet Oral Q breakfast   enoxaparin (LOVENOX) injection  40 mg Subcutaneous Q24H   midodrine  10 mg Oral TID WC   sulfamethoxazole-trimethoprim  1 tablet Oral Once per day on Mon Wed Fri   Continuous Infusions:  sodium chloride 100 mL/hr at 02/11/21 1839   fluconazole (DIFLUCAN) IV 100 mg (02/11/21 1333)   meropenem (MERREM) IV 1 g (02/11/21 2124)   promethazine (PHENERGAN) injection (IM or IVPB) 6.25 mg (02/11/21 2120)   PRN Meds:.acetaminophen, ondansetron (ZOFRAN) IV, promethazine (PHENERGAN) injection (IM or IVPB)  Diet Orders (From admission, onward)     Start     Ordered   02/12/21 1048  Diet full liquid Room service appropriate? Yes; Fluid consistency: Thin  Diet effective now       Question Answer Comment  Room service appropriate? Yes   Fluid consistency: Thin      02/12/21 1047            DVT prophylaxis: enoxaparin (LOVENOX)  injection 40 mg Start: 02/10/21 1000     Code Status: Full Code  Family Communication: no family at bedside   Status is: Inpatient  Remains inpatient appropriate because:Inpatient level of care appropriate due to severity of illness  Dispo: The patient is from:  Home              Anticipated d/c is to: Home              Patient currently is not medically stable to d/c.   Difficult to place patient No  Level of care: Progressive  Consultants:  ID  Procedures:  None   Microbiology  Blood culture 7/19-NGTD RT-PCR-negative  Antimicrobials: Bactrim, meropenem, fluconazole   Objective: Vitals:   02/11/21 1929 02/12/21 0011 02/12/21 0357 02/12/21 0748  BP: 97/65 99/61 103/68 99/60  Pulse: 89 95 90 84  Resp: 19 17 18    Temp: 98 F (36.7 C) 98.6 F (37 C) 99.2 F (37.3 C) 98.2 F (36.8 C)  TempSrc:  Oral Oral   SpO2: 99% 98% 98% 98%  Weight:      Height:        Intake/Output Summary (Last 24 hours) at 02/12/2021 1051 Last data filed at 02/12/2021 0547 Gross per 24 hour  Intake 3065.11 ml  Output 2600 ml  Net 465.11 ml    Filed Weights   02/09/21 2045  Weight: 79.4 kg    Examination:  Constitutional: NAD, in bed Eyes: No icterus ENMT: mmm Neck: normal, supple Respiratory: Clear bilaterally, no wheezing, crackles, normal respiratory effort Cardiovascular: Regular rate and rhythm, no murmurs, trace lower extremity edema Abdomen: Soft, NT, ND, bowel sounds positive Musculoskeletal: no clubbing / cyanosis.  Skin: No rashes seen Neurologic: No focal deficits   Data Reviewed: I have independently reviewed following labs and imaging studies  CBC: Recent Labs  Lab 02/09/21 1241 02/10/21 0316 02/11/21 0158 02/12/21 0305  WBC 7.6 6.2 4.3 4.2  NEUTROABS 5.9  --   --   --   HGB 11.3* 11.2* 9.2* 8.9*  HCT 35.6* 35.1* 27.7* 26.0*  MCV 94.9 95.4 91.4 90.0  PLT 199 161 158 170    Basic Metabolic Panel: Recent Labs  Lab 02/09/21 1241 02/10/21 0316 02/11/21 0158 02/12/21 0305  NA 130* 129* 129* 130*  K 3.1* 3.5 3.5 3.7  CL 100 100 102 106  CO2 18* 19* 15* 17*  GLUCOSE 170* 111* 86 90  BUN 20 17 25* 25*  CREATININE 1.49* 1.23 2.62* 1.77*  CALCIUM 8.0* 7.9* 7.3* 7.7*  MG  --  1.9  --   --     Liver Function  Tests: Recent Labs  Lab 02/09/21 1241 02/11/21 0158  AST 33 24  ALT 12 12  ALKPHOS 65 47  BILITOT 0.9 0.9  PROT 9.3* 6.9  ALBUMIN 2.9* 2.1*    Coagulation Profile: No results for input(s): INR, PROTIME in the last 168 hours. HbA1C: No results for input(s): HGBA1C in the last 72 hours. CBG: No results for input(s): GLUCAP in the last 168 hours.  Recent Results (from the past 240 hour(s))  Resp Panel by RT-PCR (Flu A&B, Covid) Nasopharyngeal Swab     Status: None   Collection Time: 02/09/21 12:59 PM   Specimen: Nasopharyngeal Swab; Nasopharyngeal(NP) swabs in vial transport medium  Result Value Ref Range Status   SARS Coronavirus 2 by RT PCR NEGATIVE NEGATIVE Final    Comment: (NOTE) SARS-CoV-2 target nucleic acids are NOT DETECTED.  The SARS-CoV-2 RNA is generally detectable  in upper respiratory specimens during the acute phase of infection. The lowest concentration of SARS-CoV-2 viral copies this assay can detect is 138 copies/mL. A negative result does not preclude SARS-Cov-2 infection and should not be used as the sole basis for treatment or other patient management decisions. A negative result may occur with  improper specimen collection/handling, submission of specimen other than nasopharyngeal swab, presence of viral mutation(s) within the areas targeted by this assay, and inadequate number of viral copies(<138 copies/mL). A negative result must be combined with clinical observations, patient history, and epidemiological information. The expected result is Negative.  Fact Sheet for Patients:  BloggerCourse.com  Fact Sheet for Healthcare Providers:  SeriousBroker.it  This test is no t yet approved or cleared by the [place] FDA and  has been authorized for detection and/or diagnosis of SARS-CoV-2 by FDA under an Emergency Use Authorization (EUA). This EUA will remain  in effect (meaning this test can be used)  for the duration of the COVID-19 declaration under Section 564(b)(1) of the Act, 21 U.S.C.section 360bbb-3(b)(1), unless the authorization is terminated  or revoked sooner.       Influenza A by PCR NEGATIVE NEGATIVE Final   Influenza B by PCR NEGATIVE NEGATIVE Final    Comment: (NOTE) The Xpert Xpress SARS-CoV-2/FLU/RSV plus assay is intended as an aid in the diagnosis of influenza from Nasopharyngeal swab specimens and should not be used as a sole basis for treatment. Nasal washings and aspirates are unacceptable for Xpert Xpress SARS-CoV-2/FLU/RSV testing.  Fact Sheet for Patients: BloggerCourse.com  Fact Sheet for Healthcare Providers: SeriousBroker.it  This test is not yet approved or cleared by the [place] FDA and has been authorized for detection and/or diagnosis of SARS-CoV-2 by FDA under an Emergency Use Authorization (EUA). This EUA will remain in effect (meaning this test can be used) for the duration of the COVID-19 declaration under Section 564(b)(1) of the Act, 21 U.S.C. section 360bbb-3(b)(1), unless the authorization is terminated or revoked.  Performed at [facility], [address], [place], [place] [address]   Urine Culture     Status: Abnormal   Collection Time: 02/09/21  6:32 PM   Specimen: Urine, Clean Catch  Result Value Ref Range Status   Specimen Description URINE, CLEAN CATCH  Final   Special Requests   Final    Immunocompromised Performed at [facility], [address], [place], [place] [address]    Culture (A)  Final    >=100,000 COLONIES/mL ESCHERICHIA COLI Confirmed Extended Spectrum Beta-Lactamase Producer (ESBL).  In bloodstream infections from ESBL organisms, carbapenems are preferred over piperacillin/tazobactam. They are shown to have a lower risk of mortality.    Report Status 02/12/2021 FINAL  Final   Organism ID, Bacteria ESCHERICHIA COLI (A)  Final       Susceptibility   Escherichia coli - MIC*    AMPICILLIN >=32 RESISTANT Resistant     CEFAZOLIN >=64 RESISTANT Resistant     CEFEPIME >=32 RESISTANT Resistant     CEFTRIAXONE >=64 RESISTANT Resistant     CIPROFLOXACIN >=4 RESISTANT Resistant     GENTAMICIN >=16 RESISTANT Resistant     IMIPENEM <=0.25 SENSITIVE Sensitive     NITROFURANTOIN <=16 SENSITIVE Sensitive     TRIMETH/SULFA >=320 RESISTANT Resistant     AMPICILLIN/SULBACTAM >=32 RESISTANT Resistant     PIP/TAZO 8 SENSITIVE Sensitive     * >=100,000 COLONIES/mL ESCHERICHIA COLI  Blood culture (routine x 2)     Status: None (Preliminary  result)   Collection Time: 02/09/21  6:35 PM   Specimen: BLOOD  Result Value Ref Range Status   Specimen Description BLOOD RIGHT ANTECUBITAL  Final   Special Requests   Final    BOTTLES DRAWN AEROBIC AND ANAEROBIC Blood Culture adequate volume   Culture   Final    NO GROWTH 3 DAYS Performed at [facility], [address], [place], [place] [address]    Report Status PENDING  Incomplete  Blood culture (routine x 2)     Status: None (Preliminary result)   Collection Time: 02/09/21  6:35 PM   Specimen: BLOOD  Result Value Ref Range Status   Specimen Description BLOOD LEFT ANTECUBITAL  Final   Special Requests   Final    BOTTLES DRAWN AEROBIC AND ANAEROBIC Blood Culture adequate volume   Culture   Final    NO GROWTH 3 DAYS Performed at [facility], [address], [place], [place] [address]    Report Status PENDING  Incomplete  Acid Fast Smear (AFB)     Status: None   Collection Time: 02/10/21 11:25 PM   Specimen: Vein; Blood  Result Value Ref Range Status   AFB Specimen Processing Concentration  Final   Acid Fast Smear Negative  Final    Comment: (NOTE) Performed At: [facility] [address] [place], [place] [id number] Jolene Schimke MD IN:[id number]    Source (AFB) SPUTUM  Final    Comment: Performed at [facility], [address] [place], [place]  [address]      Radiology Studies: US RENAL  Result Date: 02/11/2021 CLINICAL DATA:  Acute renal insufficiency EXAM: RENAL / URINARY TRACT ULTRASOUND COMPLETE COMPARISON:  02/11/2021 FINDINGS: Right Kidney: Renal measurements: 12.9 x 6.5 x 6.3 cm = volume: 277.2 mL. Echogenicity within normal limits. No evidence of renal mass. Trace right hydronephrosis. Left Kidney: Renal measurements: 13.1 x 6.5 x 6.8 cm = volume: 304.0 mL. Echogenicity within normal limits. No mass or hydronephrosis visualized. Bladder: Bladder is decompressed with a Foley catheter. Other: None. IMPRESSION: 1. Trace right hydronephrosis, with an improved appearance since prior study. 2. Choose otherwise unremarkable appearance of the bilateral kidneys. Electronically Signed   By: Sharlet Salina M.D.   On: 02/11/2021 22:38    Pamella Pert, MD, PhD [facility]  Between 7 am - 7 pm I am available, please contact me via Amion (for emergencies) or Securechat (non urgent messages)  Between 7 pm - 7 am I am not available, please contact night coverage MD/APP via Amion

## 2021-02-12 NOTE — Progress Notes (Signed)
Subjective: Patient feeling better today nausea was improved.  Creatinine improved overnight after Foley placement do not feel stent indicated as clinically getting better  Objective: Vital signs in last 24 hours: Temp:  [98 F (36.7 C)-99.2 F (37.3 C)] 98.9 F (37.2 C) (07/22 1602) Pulse Rate:  [84-95] 86 (07/22 1602) Resp:  [17-19] 18 (07/22 0357) BP: (97-108)/(60-71) 102/71 (07/22 1602) SpO2:  [98 %-100 %] 100 % (07/22 1602)  Intake/Output from previous day: 07/21 0701 - 07/22 0700 In: 3163.7 [P.O.:110; I.V.:1390.7; IV Piggyback:1663] Out: 3300 [Urine:2700; Emesis/NG output:600] Intake/Output this shift: No intake/output data recorded.  Physical Exam:   Lab Results: Recent Labs    02/10/21 0316 02/11/21 0158 02/12/21 0305  HGB 11.2* 9.2* 8.9*  HCT 35.1* 27.7* 26.0*   BMET Recent Labs    02/11/21 0158 02/12/21 0305  NA 129* 130*  K 3.5 3.7  CL 102 106  CO2 15* 17*  GLUCOSE 86 90  BUN 25* 25*  CREATININE 2.62* 1.77*  CALCIUM 7.3* 7.7*     Studies/Results: CT ABDOMEN PELVIS WO CONTRAST  Result Date: 02/11/2021 CLINICAL DATA:  Diffuse abdominal pain with vomiting. EXAM: CT ABDOMEN AND PELVIS WITHOUT CONTRAST TECHNIQUE: Multidetector CT imaging of the abdomen and pelvis was performed following the standard protocol without IV contrast. COMPARISON:  10/15/2020 FINDINGS: Lower chest: Interval development of ground-glass and collapse/consolidative opacity in the dependent lung bases bilaterally, right greater than left. Findings probably reflect atelectasis with superimposed infection cannot be entirely excluded. Hepatobiliary: No focal abnormality in the liver on this study without intravenous contrast. There is no evidence for gallstones, gallbladder wall thickening, or pericholecystic fluid. No intrahepatic or extrahepatic biliary dilation. Pancreas: No focal mass lesion. No dilatation of the main duct. No intraparenchymal cyst. No peripancreatic edema. Spleen: No  splenomegaly. No focal mass lesion. Adrenals/Urinary Tract: No adrenal nodule or mass. Right kidney appears edematous with fullness of the intrarenal collecting system and proximal right ureter. Subtle changes of proximal periureteric edema noted. Left kidney unremarkable on noncontrast imaging. Urine in the bladder has increased attenuation, likely reflecting excreted gadolinium contrast from brain MRI yesterday. Stomach/Bowel: Small hiatal hernia with mild circumferential wall thickening noted distal esophagus stomach otherwise unremarkable. No small bowel wall thickening. No small bowel dilatation. Neither the terminal ileum nor the appendix are discretely visible on this study without oral or intravenous contrast material. Colon is nondistended. Vascular/Lymphatic: No abdominal aortic aneurysm. There is no gastrohepatic or hepatoduodenal ligament lymphadenopathy. Para-aortic retroperitoneal lymphadenopathy again noted, similar to prior. 12 mm short axis left para-aortic node on 46/3 was 10 mm previously. Reproductive: The prostate gland and seminal vesicles are unremarkable. Other: No intraperitoneal free fluid. Musculoskeletal: No worrisome lytic or sclerotic osseous abnormality. IMPRESSION: 1. Interval development of ground-glass and collapse/consolidative opacity in the dependent lung bases bilaterally, right greater than left. Findings probably reflect atelectasis with superimposed infection cannot be entirely excluded. 2. Right kidney appears enlarged and edematous with peripelvic and proximal periureteric edema. Right intrarenal collecting system and proximal right ureter are distended. Assessment limited by the lack of intravenous contrast material, but given relatively normal appearance on CT from 4 months ago and lack of urinary stone disease, imaging features may be related to pyelonephritis. 3. Similar appearance of retroperitoneal lymphadenopathy in this patient with reported history of HIV. 4. Small  hiatal hernia with mild circumferential wall thickening distal esophagus. Esophagitis could have this appearance. Electronically Signed   By: Kennith Center M.D.   On: 02/11/2021 10:57   US RENAL  Result Date: 02/11/2021 CLINICAL DATA:  Acute renal insufficiency EXAM: RENAL / URINARY TRACT ULTRASOUND COMPLETE COMPARISON:  02/11/2021 FINDINGS: Right Kidney: Renal measurements: 12.9 x 6.5 x 6.3 cm = volume: 277.2 mL. Echogenicity within normal limits. No evidence of renal mass. Trace right hydronephrosis. Left Kidney: Renal measurements: 13.1 x 6.5 x 6.8 cm = volume: 304.0 mL. Echogenicity within normal limits. No mass or hydronephrosis visualized. Bladder: Bladder is decompressed with a Foley catheter. Other: None. IMPRESSION: 1. Trace right hydronephrosis, with an improved appearance since prior study. 2. Choose otherwise unremarkable appearance of the bilateral kidneys. Electronically Signed   By: Sharlet Salina M.D.   On: 02/11/2021 22:38    Assessment/Plan: 1.  Likely pyelonephritis doubt obstructive, improvement with antibiotics, recommend continuing IV antibiotics and follow clinically.  No stent unless renal function deteriorates.  Will be available for further consultation if needed.    LOS: 3 days   Belva Agee 02/12/2021, 4:13 PM

## 2021-02-13 DIAGNOSIS — A419 Sepsis, unspecified organism: Secondary | ICD-10-CM

## 2021-02-13 DIAGNOSIS — R6510 Systemic inflammatory response syndrome (SIRS) of non-infectious origin without acute organ dysfunction: Secondary | ICD-10-CM | POA: Diagnosis not present

## 2021-02-13 LAB — BASIC METABOLIC PANEL
Anion gap: 9 (ref 5–15)
BUN: 17 mg/dL (ref 6–20)
CO2: 17 mmol/L — ABNORMAL LOW (ref 22–32)
Calcium: 7.9 mg/dL — ABNORMAL LOW (ref 8.9–10.3)
Chloride: 108 mmol/L (ref 98–111)
Creatinine, Ser: 1.21 mg/dL (ref 0.61–1.24)
GFR, Estimated: >60 mL/min (ref >60)
Glucose, Bld: 82 mg/dL (ref 70–99)
Potassium: 4.4 mmol/L (ref 3.5–5.1)
Sodium: 134 mmol/L — ABNORMAL LOW (ref 135–145)

## 2021-02-13 LAB — CBC
HCT: 29.9 % — ABNORMAL LOW (ref 39.0–52.0)
Hemoglobin: 10 g/dL — ABNORMAL LOW (ref 13.0–17.0)
MCH: 30.1 pg (ref 26.0–34.0)
MCHC: 33.4 g/dL (ref 30.0–36.0)
MCV: 90.1 fL (ref 80.0–100.0)
Platelets: 178 10*3/uL (ref 150–400)
RBC: 3.32 MIL/uL — ABNORMAL LOW (ref 4.22–5.81)
RDW: 15.9 % — ABNORMAL HIGH (ref 11.5–15.5)
WBC: 2.7 10*3/uL — ABNORMAL LOW (ref 4.0–10.5)
nRBC: 0 % (ref 0.0–0.2)

## 2021-02-13 LAB — MAGNESIUM: Magnesium: 1.9 mg/dL (ref 1.7–2.4)

## 2021-02-13 MED ORDER — SODIUM CHLORIDE 0.9 % IV SOLN: INTRAVENOUS | Status: AC

## 2021-02-13 MED ORDER — FLUCONAZOLE 100 MG PO TABS
100.0000 mg | ORAL_TABLET | Freq: Every day | ORAL | Status: DC
  Administered 2021-02-13 – 2021-02-14 (×2): 100 mg via ORAL
  Filled 2021-02-13 (×2): qty 1

## 2021-02-13 NOTE — Progress Notes (Signed)
PROGRESS NOTE    Jacob Gutierrez  WUJ:[id number] DOB: 1986/05/22 DOA: 02/09/2021 PCP: Patient, No Pcp Per (Inactive)   Brief Narrative:  35 year old male with HIV/AIDS (CD4 less than 35 and viral load 978K in March), nonadherence to anti-HIV regimen, history of treated syphilis who was admitted to the hospital with fever, chills, headaches, nausea and vomiting.  He was doing fairly well up until the day prior to admission and all of a sudden felt quite sick and came to the hospital.  He was febrile in the ED to 39.4, tachycardic, chest x-ray was unremarkable.  Head CT showed stable chronic encephalomalacia without anything acute, UDS showed amphetamines and THC.  COVID-19 was negative.  He was placed on broad-spectrum antibiotics and was admitted to the hospital.  Patient refused an LP.  Found to have sepsis secondary to ESBL pyelonephritis and acute kidney injury.  AKI improved with Foley catheter placement and IV antibiotics.  Urology team was consulted who suggested conservative management with Foley catheter and IV antibiotics for now.  Infectious disease team following as well.   Assessment & Plan:   Principal Problem:   SIRS (systemic inflammatory response syndrome) (HCC) Active Problems:   Human immunodeficiency virus (HIV) disease (HCC)   AIDS (acquired immune deficiency syndrome) (HCC)   Thrush   Fever   Hyponatremia   Pyelonephritis   Hypokalemia   AKI (acute kidney injury) (HCC)   Bilious vomiting with nausea   Obstructive uropathy   Prerenal renal failure   Nonadherence to medical treatment   ESBL (extended spectrum beta-lactamase) producing bacteria infection  Severe sepsis secondary to ESBL pyelonephritis - Sepsis physio improved. Noncompliance to his antiviral therapy.  Infectious work-up including CT abdomen pelvis showed right-sided pyelonephritis with mild hydronephrosis. - IV antibiotics-Bactrim 3 times daily.  Fluconazole 100 mg every 24 hours.  Meropenem 1 g  every 8 hours - ID following  Acute kidney injury with right-sided hydronephrosis - Foley catheter placed.  Admission creatinine 1.49, peaked at 2.5 now resolved - Seen by urology-continue IV antibiotics and Foley catheter  HIV/AIDS - Patient is noncompliant with his therapy.  Infectious disease following.     DVT prophylaxis: Lovenox Code Status: Full code Family Communication: None at bedside  Status is: Inpatient  Remains inpatient appropriate because:Inpatient level of care appropriate due to severity of illness  Dispo: The patient is from: Home              Anticipated d/c is to: Home              Patient currently is not medically stable to d/c.  Maintain hospital stay for IV antibiotics.  Follow-up ID recommendations.   Difficult to place patient No      Subjective: No complaints, no acute events overnight.  Patient is adamant that he will not be going home with Foley catheter.  Review of Systems Otherwise negative except as per HPI, including: General: Denies fever, chills, night sweats or unintended weight loss. Resp: Denies cough, wheezing, shortness of breath. Cardiac: Denies chest pain, palpitations, orthopnea, paroxysmal nocturnal dyspnea. GI: Denies abdominal pain, nausea, vomiting, diarrhea or constipation GU: Denies dysuria, frequency, hesitancy or incontinence MS: Denies muscle aches, joint pain or swelling Neuro: Denies headache, neurologic deficits (focal weakness, numbness, tingling), abnormal gait Psych: Denies anxiety, depression, SI/HI/AVH Skin: Denies new rashes or lesions ID: Denies sick contacts, exotic exposures, travel  Examination:  General exam: Appears calm and comfortable  Respiratory system: Clear to auscultation. Respiratory effort normal. Cardiovascular system:  S1 & S2 heard, RRR. No JVD, murmurs, rubs, gallops or clicks. No pedal edema. Gastrointestinal system: Abdomen is nondistended, soft and nontender. No organomegaly or masses  felt. Normal bowel sounds heard. Central nervous system: Alert and oriented. No focal neurological deficits. Extremities: Symmetric 5 x 5 power. Skin: No rashes, lesions or ulcers Psychiatry: Judgement and insight appear normal. Mood & affect appropriate.   Foley catheter in place  Objective: Vitals:   02/12/21 0748 02/12/21 1156 02/12/21 1602 02/12/21 2128  BP: 99/60 108/69 102/71 100/64  Pulse: 84 85 86 67  Resp:    18  Temp: 98.2 F (36.8 C) 99 F (37.2 C) 98.9 F (37.2 C) 99.5 F (37.5 C)  TempSrc:  Oral  Oral  SpO2: 98% 99% 100% 98%  Weight:      Height:        Intake/Output Summary (Last 24 hours) at 02/13/2021 0732 Last data filed at 02/13/2021 0500 Gross per 24 hour  Intake 400 ml  Output 2700 ml  Net -2300 ml   Filed Weights   02/09/21 2045  Weight: 79.4 kg     Data Reviewed:   CBC: Recent Labs  Lab 02/09/21 1241 02/10/21 0316 02/11/21 0158 02/12/21 0305 02/13/21 0223  WBC 7.6 6.2 4.3 4.2 2.7*  NEUTROABS 5.9  --   --   --   --   HGB 11.3* 11.2* 9.2* 8.9* 10.0*  HCT 35.6* 35.1* 27.7* 26.0* 29.9*  MCV 94.9 95.4 91.4 90.0 90.1  PLT 199 161 158 170 178   Basic Metabolic Panel: Recent Labs  Lab 02/09/21 1241 02/10/21 0316 02/11/21 0158 02/12/21 0305 02/13/21 0223  NA 130* 129* 129* 130* 134*  K 3.1* 3.5 3.5 3.7 4.4  CL 100 100 102 106 108  CO2 18* 19* 15* 17* 17*  GLUCOSE 170* 111* 86 90 82  BUN 20 17 25* 25* 17  CREATININE 1.49* 1.23 2.62* 1.77* 1.21  CALCIUM 8.0* 7.9* 7.3* 7.7* 7.9*  MG  --  1.9  --   --  1.9   GFR: Estimated Creatinine Clearance: 93.9 mL/min (by C-G formula based on SCr of 1.21 mg/dL). Liver Function Tests: Recent Labs  Lab 02/09/21 1241 02/11/21 0158  AST 33 24  ALT 12 12  ALKPHOS 65 47  BILITOT 0.9 0.9  PROT 9.3* 6.9  ALBUMIN 2.9* 2.1*   Recent Labs  Lab 02/09/21 1241  LIPASE 33   Recent Labs  Lab 02/09/21 1818  AMMONIA 15   Coagulation Profile: No results for input(s): INR, PROTIME in the last  168 hours. Cardiac Enzymes: No results for input(s): CKTOTAL, CKMB, CKMBINDEX, TROPONINI in the last 168 hours. BNP (last 3 results) No results for input(s): PROBNP in the last 8760 hours. HbA1C: No results for input(s): HGBA1C in the last 72 hours. CBG: No results for input(s): GLUCAP in the last 168 hours. Lipid Profile: No results for input(s): CHOL, HDL, LDLCALC, TRIG, CHOLHDL, LDLDIRECT in the last 72 hours. Thyroid Function Tests: Recent Labs    02/10/21 1314  TSH 1.545   Anemia Panel: No results for input(s): VITAMINB12, FOLATE, FERRITIN, TIBC, IRON, RETICCTPCT in the last 72 hours. Sepsis Labs: Recent Labs  Lab 02/09/21 1813  LATICACIDVEN 1.2    Recent Results (from the past 240 hour(s))  Resp Panel by RT-PCR (Flu A&B, Covid) Nasopharyngeal Swab     Status: None   Collection Time: 02/09/21 12:59 PM   Specimen: Nasopharyngeal Swab; Nasopharyngeal(NP) swabs in vial transport medium  Result Value Ref Range  Status   SARS Coronavirus 2 by RT PCR NEGATIVE NEGATIVE Final    Comment: (NOTE) SARS-CoV-2 target nucleic acids are NOT DETECTED.  The SARS-CoV-2 RNA is generally detectable in upper respiratory specimens during the acute phase of infection. The lowest concentration of SARS-CoV-2 viral copies this assay can detect is 138 copies/mL. A negative result does not preclude SARS-Cov-2 infection and should not be used as the sole basis for treatment or other patient management decisions. A negative result may occur with  improper specimen collection/handling, submission of specimen other than nasopharyngeal swab, presence of viral mutation(s) within the areas targeted by this assay, and inadequate number of viral copies(<138 copies/mL). A negative result must be combined with clinical observations, patient history, and epidemiological information. The expected result is Negative.  Fact Sheet for Patients:  BloggerCourse.com  Fact Sheet for  Healthcare Providers:  SeriousBroker.it  This test is no t yet approved or cleared by the [place] FDA and  has been authorized for detection and/or diagnosis of SARS-CoV-2 by FDA under an Emergency Use Authorization (EUA). This EUA will remain  in effect (meaning this test can be used) for the duration of the COVID-19 declaration under Section 564(b)(1) of the Act, 21 U.S.C.section 360bbb-3(b)(1), unless the authorization is terminated  or revoked sooner.       Influenza A by PCR NEGATIVE NEGATIVE Final   Influenza B by PCR NEGATIVE NEGATIVE Final    Comment: (NOTE) The Xpert Xpress SARS-CoV-2/FLU/RSV plus assay is intended as an aid in the diagnosis of influenza from Nasopharyngeal swab specimens and should not be used as a sole basis for treatment. Nasal washings and aspirates are unacceptable for Xpert Xpress SARS-CoV-2/FLU/RSV testing.  Fact Sheet for Patients: BloggerCourse.com  Fact Sheet for Healthcare Providers: SeriousBroker.it  This test is not yet approved or cleared by the [place] FDA and has been authorized for detection and/or diagnosis of SARS-CoV-2 by FDA under an Emergency Use Authorization (EUA). This EUA will remain in effect (meaning this test can be used) for the duration of the COVID-19 declaration under Section 564(b)(1) of the Act, 21 U.S.C. section 360bbb-3(b)(1), unless the authorization is terminated or revoked.  Performed at [facility], [address], [place], [place] [address]   Urine Culture     Status: Abnormal   Collection Time: 02/09/21  6:32 PM   Specimen: Urine, Clean Catch  Result Value Ref Range Status   Specimen Description URINE, CLEAN CATCH  Final   Special Requests   Final    Immunocompromised Performed at [facility], [address] [place], [place] [address]    Culture (A)  Final    >=100,000 COLONIES/mL ESCHERICHIA  COLI Confirmed Extended Spectrum Beta-Lactamase Producer (ESBL).  In bloodstream infections from ESBL organisms, carbapenems are preferred over piperacillin/tazobactam. They are shown to have a lower risk of mortality.    Report Status 02/12/2021 FINAL  Final   Organism ID, Bacteria ESCHERICHIA COLI (A)  Final      Susceptibility   Escherichia coli - MIC*    AMPICILLIN >=32 RESISTANT Resistant     CEFAZOLIN >=64 RESISTANT Resistant     CEFEPIME >=32 RESISTANT Resistant     CEFTRIAXONE >=64 RESISTANT Resistant     CIPROFLOXACIN >=4 RESISTANT Resistant     GENTAMICIN >=16 RESISTANT Resistant     IMIPENEM <=0.25 SENSITIVE Sensitive     NITROFURANTOIN <=16 SENSITIVE Sensitive     TRIMETH/SULFA >=320 RESISTANT Resistant     AMPICILLIN/SULBACTAM >=32 RESISTANT  Resistant     PIP/TAZO 8 SENSITIVE Sensitive     * >=100,000 COLONIES/mL ESCHERICHIA COLI  Blood culture (routine x 2)     Status: None (Preliminary result)   Collection Time: 02/09/21  6:35 PM   Specimen: BLOOD  Result Value Ref Range Status   Specimen Description BLOOD RIGHT ANTECUBITAL  Final   Special Requests   Final    BOTTLES DRAWN AEROBIC AND ANAEROBIC Blood Culture adequate volume   Culture   Final    NO GROWTH 3 DAYS Performed at [facility], [address] [place], [place] [address]    Report Status PENDING  Incomplete  Blood culture (routine x 2)     Status: None (Preliminary result)   Collection Time: 02/09/21  6:35 PM   Specimen: BLOOD  Result Value Ref Range Status   Specimen Description BLOOD LEFT ANTECUBITAL  Final   Special Requests   Final    BOTTLES DRAWN AEROBIC AND ANAEROBIC Blood Culture adequate volume   Culture   Final    NO GROWTH 3 DAYS Performed at [facility], [address], [place], [place] [address]    Report Status PENDING  Incomplete  Acid Fast Smear (AFB)     Status: None   Collection Time: 02/10/21 11:25 PM   Specimen: Vein; Blood  Result Value Ref Range Status   AFB  Specimen Processing Concentration  Final   Acid Fast Smear Negative  Final    Comment: (NOTE) Performed At: [facility] [address] [place], [place] [id number] Jolene Schimke MD QM:[id number]    Source (AFB) SPUTUM  Final    Comment: Performed at [facility], [address] [place], [place] [address]         Radiology Studies: CT ABDOMEN PELVIS WO CONTRAST  Result Date: 02/11/2021 CLINICAL DATA:  Diffuse abdominal pain with vomiting. EXAM: CT ABDOMEN AND PELVIS WITHOUT CONTRAST TECHNIQUE: Multidetector CT imaging of the abdomen and pelvis was performed following the standard protocol without IV contrast. COMPARISON:  10/15/2020 FINDINGS: Lower chest: Interval development of ground-glass and collapse/consolidative opacity in the dependent lung bases bilaterally, right greater than left. Findings probably reflect atelectasis with superimposed infection cannot be entirely excluded. Hepatobiliary: No focal abnormality in the liver on this study without intravenous contrast. There is no evidence for gallstones, gallbladder wall thickening, or pericholecystic fluid. No intrahepatic or extrahepatic biliary dilation. Pancreas: No focal mass lesion. No dilatation of the main duct. No intraparenchymal cyst. No peripancreatic edema. Spleen: No splenomegaly. No focal mass lesion. Adrenals/Urinary Tract: No adrenal nodule or mass. Right kidney appears edematous with fullness of the intrarenal collecting system and proximal right ureter. Subtle changes of proximal periureteric edema noted. Left kidney unremarkable on noncontrast imaging. Urine in the bladder has increased attenuation, likely reflecting excreted gadolinium contrast from brain MRI yesterday. Stomach/Bowel: Small hiatal hernia with mild circumferential wall thickening noted distal esophagus stomach otherwise unremarkable. No small bowel wall thickening. No small bowel dilatation. Neither the terminal ileum nor the appendix are  discretely visible on this study without oral or intravenous contrast material. Colon is nondistended. Vascular/Lymphatic: No abdominal aortic aneurysm. There is no gastrohepatic or hepatoduodenal ligament lymphadenopathy. Para-aortic retroperitoneal lymphadenopathy again noted, similar to prior. 12 mm short axis left para-aortic node on 46/3 was 10 mm previously. Reproductive: The prostate gland and seminal vesicles are unremarkable. Other: No intraperitoneal free fluid. Musculoskeletal: No worrisome lytic or sclerotic osseous abnormality. IMPRESSION: 1. Interval development of ground-glass and collapse/consolidative  opacity in the dependent lung bases bilaterally, right greater than left. Findings probably reflect atelectasis with superimposed infection cannot be entirely excluded. 2. Right kidney appears enlarged and edematous with peripelvic and proximal periureteric edema. Right intrarenal collecting system and proximal right ureter are distended. Assessment limited by the lack of intravenous contrast material, but given relatively normal appearance on CT from 4 months ago and lack of urinary stone disease, imaging features may be related to pyelonephritis. 3. Similar appearance of retroperitoneal lymphadenopathy in this patient with reported history of HIV. 4. Small hiatal hernia with mild circumferential wall thickening distal esophagus. Esophagitis could have this appearance. Electronically Signed   By: Kennith Center M.D.   On: 02/11/2021 10:57   US RENAL  Result Date: 02/11/2021 CLINICAL DATA:  Acute renal insufficiency EXAM: RENAL / URINARY TRACT ULTRASOUND COMPLETE COMPARISON:  02/11/2021 FINDINGS: Right Kidney: Renal measurements: 12.9 x 6.5 x 6.3 cm = volume: 277.2 mL. Echogenicity within normal limits. No evidence of renal mass. Trace right hydronephrosis. Left Kidney: Renal measurements: 13.1 x 6.5 x 6.8 cm = volume: 304.0 mL. Echogenicity within normal limits. No mass or hydronephrosis  visualized. Bladder: Bladder is decompressed with a Foley catheter. Other: None. IMPRESSION: 1. Trace right hydronephrosis, with an improved appearance since prior study. 2. Choose otherwise unremarkable appearance of the bilateral kidneys. Electronically Signed   By: Sharlet Salina M.D.   On: 02/11/2021 22:38        Scheduled Meds:  Chlorhexidine Gluconate Cloth  6 each Topical Daily   Darunavir-Cobicisctat-Emtricitabine-Tenofovir Alafenamide  1 tablet Oral Q breakfast   enoxaparin (LOVENOX) injection  40 mg Subcutaneous Q24H   midodrine  10 mg Oral TID WC   sulfamethoxazole-trimethoprim  1 tablet Oral Once per day on Mon Wed Fri   Continuous Infusions:  sodium chloride 100 mL/hr at 02/12/21 1623   fluconazole (DIFLUCAN) IV 100 mg (02/12/21 1514)   meropenem (MERREM) IV 1 g (02/13/21 0459)   promethazine (PHENERGAN) injection (IM or IVPB) 6.25 mg (02/11/21 2120)     LOS: 4 days   Time spent= 35 mins    Bohdan Macho Joline Maxcy, MD [facility]  If 7PM-7AM, please contact night-coverage  02/13/2021, 7:32 AM

## 2021-02-13 NOTE — Plan of Care (Signed)

## 2021-02-14 DIAGNOSIS — R6510 Systemic inflammatory response syndrome (SIRS) of non-infectious origin without acute organ dysfunction: Secondary | ICD-10-CM | POA: Diagnosis not present

## 2021-02-14 LAB — BASIC METABOLIC PANEL
Anion gap: 7 (ref 5–15)
BUN: 10 mg/dL (ref 6–20)
CO2: 19 mmol/L — ABNORMAL LOW (ref 22–32)
Calcium: 7.9 mg/dL — ABNORMAL LOW (ref 8.9–10.3)
Chloride: 108 mmol/L (ref 98–111)
Creatinine, Ser: 0.95 mg/dL (ref 0.61–1.24)
GFR, Estimated: >60 mL/min (ref >60)
Glucose, Bld: 105 mg/dL — ABNORMAL HIGH (ref 70–99)
Potassium: 3.6 mmol/L (ref 3.5–5.1)
Sodium: 134 mmol/L — ABNORMAL LOW (ref 135–145)

## 2021-02-14 LAB — CULTURE, BLOOD (ROUTINE X 2)
Culture: NO GROWTH
Culture: NO GROWTH
Special Requests: ADEQUATE
Special Requests: ADEQUATE

## 2021-02-14 MED ORDER — POTASSIUM CHLORIDE CRYS ER 20 MEQ PO TBCR
20.0000 meq | EXTENDED_RELEASE_TABLET | Freq: Once | ORAL | Status: AC
  Administered 2021-02-14: 20 meq via ORAL
  Filled 2021-02-14: qty 1

## 2021-02-14 NOTE — Progress Notes (Signed)
Attempted to put in the PIV access and failed. Patient refused to put it at this time, but he wanted to have food first. Talked patient's RN regarding this and RN will put in the consult when patient is ready. HS [employer]

## 2021-02-14 NOTE — Progress Notes (Signed)
PROGRESS NOTE    Jacob Gutierrez  YQI:[id number] DOB: [birth year] DOA: 02/09/2021 PCP: Patient, No Pcp Per (Inactive)   Brief Narrative:  35 year old male with HIV/AIDS (CD4 less than 35 and viral load 978K in March), nonadherence to anti-HIV regimen, history of treated syphilis who was admitted to the hospital with fever, chills, headaches, nausea and vomiting.  He was doing fairly well up until the day prior to admission and all of a sudden felt quite sick and came to the hospital.  He was febrile in the ED to 39.4, tachycardic, chest x-ray was unremarkable.  Head CT showed stable chronic encephalomalacia without anything acute, UDS showed amphetamines and THC.  COVID-19 was negative.  He was placed on broad-spectrum antibiotics and was admitted to the hospital.  Patient refused an LP.  Found to have sepsis secondary to ESBL pyelonephritis and acute kidney injury.  AKI improved with Foley catheter placement and IV antibiotics.  Urology team was consulted who suggested conservative management with Foley catheter and IV antibiotics for now.  Infectious disease team following as well.   Assessment & Plan:   Principal Problem:   SIRS (systemic inflammatory response syndrome) (HCC) Active Problems:   Human immunodeficiency virus (HIV) disease (HCC)   AIDS (acquired immune deficiency syndrome) (HCC)   Thrush   Fever   Hyponatremia   Pyelonephritis   Hypokalemia   AKI (acute kidney injury) (HCC)   Bilious vomiting with nausea   Obstructive uropathy   Prerenal renal failure   Nonadherence to medical treatment   ESBL (extended spectrum beta-lactamase) producing bacteria infection  Severe sepsis secondary to ESBL pyelonephritis - Sepsis physio improved. Noncompliance to his antiviral therapy.  Infectious work-up including CT abdomen pelvis showed right-sided pyelonephritis with mild hydronephrosis. - IV antibiotics-Bactrim 3 times daily.  Fluconazole 100 mg every 24 hours.  Meropenem 1 g  every 8 hours, length will be determined by ID - ID following  Acute kidney injury with right-sided hydronephrosis - Foley catheter placed.  Admission creatinine 1.49, peaked at 2.5 now resolved - Seen by urology-continue IV antibiotics and Foley catheter.  Patient does not want to go home with Foley therefore we will attempt to remove it here  HIV/AIDS - Patient is noncompliant with his therapy.  Infectious disease following.     DVT prophylaxis: Lovenox Code Status: Full code Family Communication: None at bedside  Status is: Inpatient  Remains inpatient appropriate because:Inpatient level of care appropriate due to severity of illness  Dispo: The patient is from: Home              Anticipated d/c is to: Home              Patient currently is not medically stable to d/c.  Maintain hospital stay for IV antibiotics.  Follow-up ID recommendations.   Difficult to place patient No      Subjective: No complaints, denies any nausea or vomiting.  Wishes to advance his diet.  Patient really wants to go home without Foley catheter.  For now I explained him that he needs to be in the hospital for IV antibiotics.  Review of Systems Otherwise negative except as per HPI, including: General: Denies fever, chills, night sweats or unintended weight loss. Resp: Denies cough, wheezing, shortness of breath. Cardiac: Denies chest pain, palpitations, orthopnea, paroxysmal nocturnal dyspnea. GI: Denies abdominal pain, nausea, vomiting, diarrhea or constipation GU: Denies dysuria, frequency, hesitancy or incontinence MS: Denies muscle aches, joint pain or swelling Neuro: Denies headache, neurologic deficits (  focal weakness, numbness, tingling), abnormal gait Psych: Denies anxiety, depression, SI/HI/AVH Skin: Denies new rashes or lesions ID: Denies sick contacts, exotic exposures, travel  Examination:  Constitutional: Not in acute distress Respiratory: Clear to auscultation  bilaterally Cardiovascular: Normal sinus rhythm, no rubs Abdomen: Nontender nondistended good bowel sounds Musculoskeletal: No edema noted Skin: No rashes seen Neurologic: CN 2-12 grossly intact.  And nonfocal Psychiatric: Normal judgment and insight. Alert and oriented x 3. Normal mood.  Foley catheter in place  Objective: Vitals:   02/12/21 2128 02/13/21 1445 02/13/21 2049 02/14/21 0011  BP: 100/64 113/75 111/75 102/78  Pulse: 67 62 65 63  Resp: 18 16 18 17   Temp: 99.5 F (37.5 C) 98.8 F (37.1 C) 98.2 F (36.8 C) 98.4 F (36.9 C)  TempSrc: Oral  Oral Oral  SpO2: 98% 100% 100% 100%  Weight:      Height:        Intake/Output Summary (Last 24 hours) at 02/14/2021 0857 Last data filed at 02/14/2021 02/16/2021 Gross per 24 hour  Intake 1455.32 ml  Output 4625 ml  Net -3169.68 ml   Filed Weights   02/09/21 2045  Weight: 79.4 kg     Data Reviewed:   CBC: Recent Labs  Lab 02/09/21 1241 02/10/21 0316 02/11/21 0158 02/12/21 0305 02/13/21 0223  WBC 7.6 6.2 4.3 4.2 2.7*  NEUTROABS 5.9  --   --   --   --   HGB 11.3* 11.2* 9.2* 8.9* 10.0*  HCT 35.6* 35.1* 27.7* 26.0* 29.9*  MCV 94.9 95.4 91.4 90.0 90.1  PLT 199 161 158 170 178   Basic Metabolic Panel: Recent Labs  Lab 02/10/21 0316 02/11/21 0158 02/12/21 0305 02/13/21 0223 02/14/21 0106  NA 129* 129* 130* 134* 134*  K 3.5 3.5 3.7 4.4 3.6  CL 100 102 106 108 108  CO2 19* 15* 17* 17* 19*  GLUCOSE 111* 86 90 82 105*  BUN 17 25* 25* 17 10  CREATININE 1.23 2.62* 1.77* 1.21 0.95  CALCIUM 7.9* 7.3* 7.7* 7.9* 7.9*  MG 1.9  --   --  1.9  --    GFR: Estimated Creatinine Clearance: 119.6 mL/min (by C-G formula based on SCr of 0.95 mg/dL). Liver Function Tests: Recent Labs  Lab 02/09/21 1241 02/11/21 0158  AST 33 24  ALT 12 12  ALKPHOS 65 47  BILITOT 0.9 0.9  PROT 9.3* 6.9  ALBUMIN 2.9* 2.1*   Recent Labs  Lab 02/09/21 1241  LIPASE 33   Recent Labs  Lab 02/09/21 1818  AMMONIA 15   Coagulation  Profile: No results for input(s): INR, PROTIME in the last 168 hours. Cardiac Enzymes: No results for input(s): CKTOTAL, CKMB, CKMBINDEX, TROPONINI in the last 168 hours. BNP (last 3 results) No results for input(s): PROBNP in the last 8760 hours. HbA1C: No results for input(s): HGBA1C in the last 72 hours. CBG: No results for input(s): GLUCAP in the last 168 hours. Lipid Profile: No results for input(s): CHOL, HDL, LDLCALC, TRIG, CHOLHDL, LDLDIRECT in the last 72 hours. Thyroid Function Tests: No results for input(s): TSH, T4TOTAL, FREET4, T3FREE, THYROIDAB in the last 72 hours.  Anemia Panel: No results for input(s): VITAMINB12, FOLATE, FERRITIN, TIBC, IRON, RETICCTPCT in the last 72 hours. Sepsis Labs: Recent Labs  Lab 02/09/21 1813  LATICACIDVEN 1.2    Recent Results (from the past 240 hour(s))  Resp Panel by RT-PCR (Flu A&B, Covid) Nasopharyngeal Swab     Status: None   Collection Time: 02/09/21 12:59 PM  Specimen: Nasopharyngeal Swab; Nasopharyngeal(NP) swabs in vial transport medium  Result Value Ref Range Status   SARS Coronavirus 2 by RT PCR NEGATIVE NEGATIVE Final    Comment: (NOTE) SARS-CoV-2 target nucleic acids are NOT DETECTED.  The SARS-CoV-2 RNA is generally detectable in upper respiratory specimens during the acute phase of infection. The lowest concentration of SARS-CoV-2 viral copies this assay can detect is 138 copies/mL. A negative result does not preclude SARS-Cov-2 infection and should not be used as the sole basis for treatment or other patient management decisions. A negative result may occur with  improper specimen collection/handling, submission of specimen other than nasopharyngeal swab, presence of viral mutation(s) within the areas targeted by this assay, and inadequate number of viral copies(<138 copies/mL). A negative result must be combined with clinical observations, patient history, and epidemiological information. The expected result is  Negative.  Fact Sheet for Patients:  BloggerCourse.com  Fact Sheet for Healthcare Providers:  SeriousBroker.it  This test is no t yet approved or cleared by the [place] FDA and  has been authorized for detection and/or diagnosis of SARS-CoV-2 by FDA under an Emergency Use Authorization (EUA). This EUA will remain  in effect (meaning this test can be used) for the duration of the COVID-19 declaration under Section 564(b)(1) of the Act, 21 U.S.C.section 360bbb-3(b)(1), unless the authorization is terminated  or revoked sooner.       Influenza A by PCR NEGATIVE NEGATIVE Final   Influenza B by PCR NEGATIVE NEGATIVE Final    Comment: (NOTE) The Xpert Xpress SARS-CoV-2/FLU/RSV plus assay is intended as an aid in the diagnosis of influenza from Nasopharyngeal swab specimens and should not be used as a sole basis for treatment. Nasal washings and aspirates are unacceptable for Xpert Xpress SARS-CoV-2/FLU/RSV testing.  Fact Sheet for Patients: BloggerCourse.com  Fact Sheet for Healthcare Providers: SeriousBroker.it  This test is not yet approved or cleared by the [place] FDA and has been authorized for detection and/or diagnosis of SARS-CoV-2 by FDA under an Emergency Use Authorization (EUA). This EUA will remain in effect (meaning this test can be used) for the duration of the COVID-19 declaration under Section 564(b)(1) of the Act, 21 U.S.C. section 360bbb-3(b)(1), unless the authorization is terminated or revoked.  Performed at [facility], [address] [place], [place] [address]   Urine Culture     Status: Abnormal   Collection Time: 02/09/21  6:32 PM   Specimen: Urine, Clean Catch  Result Value Ref Range Status   Specimen Description URINE, CLEAN CATCH  Final   Special Requests   Final    Immunocompromised Performed at [facility], [address], [place], [place] [address]    Culture (A)  Final    >=100,000 COLONIES/mL ESCHERICHIA COLI Confirmed Extended Spectrum Beta-Lactamase Producer (ESBL).  In bloodstream infections from ESBL organisms, carbapenems are preferred over piperacillin/tazobactam. They are shown to have a lower risk of mortality.    Report Status 02/12/2021 FINAL  Final   Organism ID, Bacteria ESCHERICHIA COLI (A)  Final      Susceptibility   Escherichia coli - MIC*    AMPICILLIN >=32 RESISTANT Resistant     CEFAZOLIN >=64 RESISTANT Resistant     CEFEPIME >=32 RESISTANT Resistant     CEFTRIAXONE >=64 RESISTANT Resistant     CIPROFLOXACIN >=4 RESISTANT Resistant     GENTAMICIN >=16 RESISTANT Resistant     IMIPENEM <=0.25 SENSITIVE Sensitive     NITROFURANTOIN <=16 SENSITIVE Sensitive  TRIMETH/SULFA >=320 RESISTANT Resistant     AMPICILLIN/SULBACTAM >=32 RESISTANT Resistant     PIP/TAZO 8 SENSITIVE Sensitive     * >=100,000 COLONIES/mL ESCHERICHIA COLI  Blood culture (routine x 2)     Status: None   Collection Time: 02/09/21  6:35 PM   Specimen: BLOOD  Result Value Ref Range Status   Specimen Description BLOOD RIGHT ANTECUBITAL  Final   Special Requests   Final    BOTTLES DRAWN AEROBIC AND ANAEROBIC Blood Culture adequate volume   Culture   Final    NO GROWTH 5 DAYS Performed at [facility], [address] [place], [place] [address]    Report Status 02/14/2021 FINAL  Final  Blood culture (routine x 2)     Status: None   Collection Time: 02/09/21  6:35 PM   Specimen: BLOOD  Result Value Ref Range Status   Specimen Description BLOOD LEFT ANTECUBITAL  Final   Special Requests   Final    BOTTLES DRAWN AEROBIC AND ANAEROBIC Blood Culture adequate volume   Culture   Final    NO GROWTH 5 DAYS Performed at [facility], [address] [place], [place] [address]    Report Status 02/14/2021 FINAL  Final  Acid Fast Smear (AFB)     Status: None   Collection Time: 02/10/21 11:25 PM    Specimen: Vein; Blood  Result Value Ref Range Status   AFB Specimen Processing Concentration  Final   Acid Fast Smear Negative  Final    Comment: (NOTE) Performed At: [facility] [address] [place], [place] [id number] Jolene SchimkeNagendra Sanjai MD AO:[id number]:[phone]    Source (AFB) SPUTUM  Final    Comment: Performed at [facility], [address] [place], [place] [address]         Radiology Studies: No results found.      Scheduled Meds:  Chlorhexidine Gluconate Cloth  6 each Topical Daily   Darunavir-Cobicisctat-Emtricitabine-Tenofovir Alafenamide  1 tablet Oral Q breakfast   enoxaparin (LOVENOX) injection  40 mg Subcutaneous Q24H   fluconazole  100 mg Oral Daily   midodrine  10 mg Oral TID WC   potassium chloride  20 mEq Oral Once   sulfamethoxazole-trimethoprim  1 tablet Oral Once per day on Mon Wed Fri   Continuous Infusions:  sodium chloride 75 mL/hr at 02/13/21 1420   meropenem (MERREM) IV 1 g (02/14/21 0502)   promethazine (PHENERGAN) injection (IM or IVPB) 6.25 mg (02/11/21 2120)     LOS: 5 days   Time spent= 35 mins    Jacob Scipio Joline Maxcyhirag Piper [place], MD [facility]  If 7PM-7AM, please contact night-coverage  02/14/2021, 8:57 AM

## 2021-02-14 NOTE — Plan of Care (Signed)
  Problem: Activity: Goal: Risk for activity intolerance will decrease Outcome: Progressing   Problem: Nutrition: Goal: Adequate nutrition will be maintained Outcome: Progressing   Problem: Coping: Goal: Level of anxiety will decrease Outcome: Progressing   Problem: Pain Managment: Goal: General experience of comfort will improve Outcome: Progressing   

## 2021-02-15 ENCOUNTER — Other Ambulatory Visit (HOSPITAL_COMMUNITY): Payer: Self-pay

## 2021-02-15 DIAGNOSIS — N179 Acute kidney failure, unspecified: Secondary | ICD-10-CM | POA: Diagnosis not present

## 2021-02-15 DIAGNOSIS — B20 Human immunodeficiency virus [HIV] disease: Secondary | ICD-10-CM | POA: Diagnosis not present

## 2021-02-15 DIAGNOSIS — R6510 Systemic inflammatory response syndrome (SIRS) of non-infectious origin without acute organ dysfunction: Secondary | ICD-10-CM | POA: Diagnosis not present

## 2021-02-15 DIAGNOSIS — R1114 Bilious vomiting: Secondary | ICD-10-CM | POA: Diagnosis not present

## 2021-02-15 LAB — BASIC METABOLIC PANEL
Anion gap: 6 (ref 5–15)
BUN: 8 mg/dL (ref 6–20)
CO2: 22 mmol/L (ref 22–32)
Calcium: 8.5 mg/dL — ABNORMAL LOW (ref 8.9–10.3)
Chloride: 107 mmol/L (ref 98–111)
Creatinine, Ser: 0.82 mg/dL (ref 0.61–1.24)
GFR, Estimated: >60 mL/min (ref >60)
Glucose, Bld: 99 mg/dL (ref 70–99)
Potassium: 3.9 mmol/L (ref 3.5–5.1)
Sodium: 135 mmol/L (ref 135–145)

## 2021-02-15 LAB — T.PALLIDUM AB, TOTAL: T Pallidum Abs: REACTIVE — AB

## 2021-02-15 MED ORDER — SULFAMETHOXAZOLE-TRIMETHOPRIM 800-160 MG PO TABS
1.0000 | ORAL_TABLET | ORAL | 0 refills | Status: DC
  Filled 2021-02-15: qty 15, 34d supply, fill #0

## 2021-02-15 MED ORDER — FLUCONAZOLE 200 MG PO TABS
200.0000 mg | ORAL_TABLET | Freq: Every day | ORAL | 0 refills | Status: DC
  Filled 2021-02-15: qty 7, 7d supply, fill #0

## 2021-02-15 MED ORDER — MIDODRINE HCL 10 MG PO TABS
10.0000 mg | ORAL_TABLET | Freq: Three times a day (TID) | ORAL | 0 refills | Status: DC
  Filled 2021-02-15: qty 90, 30d supply, fill #0

## 2021-02-15 MED ORDER — FLUCONAZOLE 100 MG PO TABS
200.0000 mg | ORAL_TABLET | Freq: Every day | ORAL | Status: DC
  Administered 2021-02-15: 200 mg via ORAL
  Filled 2021-02-15: qty 2

## 2021-02-15 NOTE — Progress Notes (Signed)
[facility]  Date of Admission:  02/09/2021     Total days of antibiotics 7         ASSESSMENT:  [name] continues to feel better. Today will be last dose of meropenem for suspected ESBL urinary infection. Primary team to have Foley catheter removed. Discussed importance of taking medications daily as prescribed. Fluconazole, Bactrim, midodrine were supplied by the [facility] pharmacy and has supply of Symtuza at home. He has follow up in the ID clinic on 8/9. Being considered for entry into the [facility] Study.   PLAN:  Continue current dose of Symtuza, Bactrim, and fluconzole.  Stop meropenem after last dose today. Ok for discharge from ID standpoint. Follow up on 8/9. Will assess appropriateness for Latitude study.   Principal Problem:   SIRS (systemic inflammatory response syndrome) (HCC) Active Problems:   Human immunodeficiency virus (HIV) disease (HCC)   AIDS (acquired immune deficiency syndrome) (HCC)   Thrush   Fever   Hyponatremia   Pyelonephritis   Hypokalemia   AKI (acute kidney injury) (HCC)   Bilious vomiting with nausea   Obstructive uropathy   Prerenal renal failure   Nonadherence to medical treatment   ESBL (extended spectrum beta-lactamase) producing bacteria infection    Chlorhexidine Gluconate Cloth  6 each Topical Daily   Darunavir-Cobicisctat-Emtricitabine-Tenofovir Alafenamide  1 tablet Oral Q breakfast   enoxaparin (LOVENOX) injection  40 mg Subcutaneous Q24H   fluconazole  200 mg Oral Daily   midodrine  10 mg Oral TID WC   sulfamethoxazole-trimethoprim  1 tablet Oral Once per day on Mon Wed Fri    SUBJECTIVE:  Afebrile overnight with no acute events. Eager to have Foley cathter removed and go home. Denies fevers and chills.   Allergies  Allergen Reactions   Vancomycin Anaphylaxis, Itching, Swelling and Other (See Comments)    Angioedema and "everything swells"   Amoxicillin Other (See Comments)    From  childhood: "I had a reaction when i was little." (??)     Review of Systems: Review of Systems  Constitutional:  Negative for chills, fever and weight loss.  Respiratory:  Negative for cough, shortness of breath and wheezing.   Cardiovascular:  Negative for chest pain and leg swelling.  Gastrointestinal:  Negative for abdominal pain, constipation, diarrhea, nausea and vomiting.  Skin:  Negative for rash.     OBJECTIVE: Vitals:   02/14/21 1249 02/14/21 2249 02/15/21 0415 02/15/21 0801  BP: 115/82 111/67 106/74 101/63  Pulse: (!) 55 64 70 63  Resp: 16 20 18 19   Temp: 98.1 F (36.7 C) 98.4 F (36.9 C) 97.7 F (36.5 C) 97.9 F (36.6 C)  TempSrc: Oral Oral Oral Oral  SpO2: 100% 100% 100% 99%  Weight:      Height:       Body mass index is 23.09 kg/m.  Physical Exam Constitutional:      General: He is not in acute distress.    Appearance: He is well-developed.  Cardiovascular:     Rate and Rhythm: Normal rate and regular rhythm.     Heart sounds: Normal heart sounds.  Pulmonary:     Effort: Pulmonary effort is normal.     Breath sounds: Normal breath sounds.  Skin:    General: Skin is warm and dry.  Neurological:     Mental Status: He is alert and oriented to person, place, and time.  Psychiatric:        Behavior: Behavior normal.  Thought Content: Thought content normal.        Judgment: Judgment normal.    Lab Results Lab Results  Component Value Date   WBC 2.7 (L) 02/13/2021   HGB 10.0 (L) 02/13/2021   HCT 29.9 (L) 02/13/2021   MCV 90.1 02/13/2021   PLT 178 02/13/2021    Lab Results  Component Value Date   CREATININE 0.82 02/15/2021   BUN 8 02/15/2021   NA 135 02/15/2021   K 3.9 02/15/2021   CL 107 02/15/2021   CO2 22 02/15/2021    Lab Results  Component Value Date   ALT 12 02/11/2021   AST 24 02/11/2021   ALKPHOS 47 02/11/2021   BILITOT 0.9 02/11/2021     Microbiology: Recent Results (from the past 240 hour(s))  Resp Panel by RT-PCR  (Flu A&B, Covid) Nasopharyngeal Swab     Status: None   Collection Time: 02/09/21 12:59 PM   Specimen: Nasopharyngeal Swab; Nasopharyngeal(NP) swabs in vial transport medium  Result Value Ref Range Status   SARS Coronavirus 2 by RT PCR NEGATIVE NEGATIVE Final    Comment: (NOTE) SARS-CoV-2 target nucleic acids are NOT DETECTED.  The SARS-CoV-2 RNA is generally detectable in upper respiratory specimens during the acute phase of infection. The lowest concentration of SARS-CoV-2 viral copies this assay can detect is 138 copies/mL. A negative result does not preclude SARS-Cov-2 infection and should not be used as the sole basis for treatment or other patient management decisions. A negative result may occur with  improper specimen collection/handling, submission of specimen other than nasopharyngeal swab, presence of viral mutation(s) within the areas targeted by this assay, and inadequate number of viral copies(<138 copies/mL). A negative result must be combined with clinical observations, patient history, and epidemiological information. The expected result is Negative.  Fact Sheet for Patients:  BloggerCourse.com  Fact Sheet for Healthcare Providers:  SeriousBroker.it  This test is no t yet approved or cleared by the [place] FDA and  has been authorized for detection and/or diagnosis of SARS-CoV-2 by FDA under an Emergency Use Authorization (EUA). This EUA will remain  in effect (meaning this test can be used) for the duration of the COVID-19 declaration under Section 564(b)(1) of the Act, 21 U.S.C.section 360bbb-3(b)(1), unless the authorization is terminated  or revoked sooner.       Influenza A by PCR NEGATIVE NEGATIVE Final   Influenza B by PCR NEGATIVE NEGATIVE Final    Comment: (NOTE) The Xpert Xpress SARS-CoV-2/FLU/RSV plus assay is intended as an aid in the diagnosis of influenza from Nasopharyngeal swab specimens  and should not be used as a sole basis for treatment. Nasal washings and aspirates are unacceptable for Xpert Xpress SARS-CoV-2/FLU/RSV testing.  Fact Sheet for Patients: BloggerCourse.com  Fact Sheet for Healthcare Providers: SeriousBroker.it  This test is not yet approved or cleared by the [place] FDA and has been authorized for detection and/or diagnosis of SARS-CoV-2 by FDA under an Emergency Use Authorization (EUA). This EUA will remain in effect (meaning this test can be used) for the duration of the COVID-19 declaration under Section 564(b)(1) of the Act, 21 U.S.C. section 360bbb-3(b)(1), unless the authorization is terminated or revoked.  Performed at [facility], [address], [place], [place] [address]   Urine Culture     Status: Abnormal   Collection Time: 02/09/21  6:32 PM   Specimen: Urine, Clean Catch  Result Value Ref Range Status   Specimen Description URINE, CLEAN CATCH  Final  Special Requests   Final    Immunocompromised Performed at [facility], [address] [place], [place] [address]    Culture (A)  Final    >=100,000 COLONIES/mL ESCHERICHIA COLI Confirmed Extended Spectrum Beta-Lactamase Producer (ESBL).  In bloodstream infections from ESBL organisms, carbapenems are preferred over piperacillin/tazobactam. They are shown to have a lower risk of mortality.    Report Status 02/12/2021 FINAL  Final   Organism ID, Bacteria ESCHERICHIA COLI (A)  Final      Susceptibility   Escherichia coli - MIC*    AMPICILLIN >=32 RESISTANT Resistant     CEFAZOLIN >=64 RESISTANT Resistant     CEFEPIME >=32 RESISTANT Resistant     CEFTRIAXONE >=64 RESISTANT Resistant     CIPROFLOXACIN >=4 RESISTANT Resistant     GENTAMICIN >=16 RESISTANT Resistant     IMIPENEM <=0.25 SENSITIVE Sensitive     NITROFURANTOIN <=16 SENSITIVE Sensitive     TRIMETH/SULFA >=320 RESISTANT Resistant      AMPICILLIN/SULBACTAM >=32 RESISTANT Resistant     PIP/TAZO 8 SENSITIVE Sensitive     * >=100,000 COLONIES/mL ESCHERICHIA COLI  Blood culture (routine x 2)     Status: None   Collection Time: 02/09/21  6:35 PM   Specimen: BLOOD  Result Value Ref Range Status   Specimen Description BLOOD RIGHT ANTECUBITAL  Final   Special Requests   Final    BOTTLES DRAWN AEROBIC AND ANAEROBIC Blood Culture adequate volume   Culture   Final    NO GROWTH 5 DAYS Performed at [facility], [address] [place], [place] [address]    Report Status 02/14/2021 FINAL  Final  Blood culture (routine x 2)     Status: None   Collection Time: 02/09/21  6:35 PM   Specimen: BLOOD  Result Value Ref Range Status   Specimen Description BLOOD LEFT ANTECUBITAL  Final   Special Requests   Final    BOTTLES DRAWN AEROBIC AND ANAEROBIC Blood Culture adequate volume   Culture   Final    NO GROWTH 5 DAYS Performed at [facility], [address] [place], [place] [address]    Report Status 02/14/2021 FINAL  Final  Acid Fast Smear (AFB)     Status: None   Collection Time: 02/10/21 11:25 PM   Specimen: Vein; Blood  Result Value Ref Range Status   AFB Specimen Processing Concentration  Final   Acid Fast Smear Negative  Final    Comment: (NOTE) Performed At: [facility] [address] [place], [place] [id number] Jolene Schimke MD DD:[id number]    Source (AFB) SPUTUM  Final    Comment: Performed at [facility], [address] [place], [place] [address]     Marcos Eke, NP [facility]  02/15/2021  11:17 AM

## 2021-02-15 NOTE — Discharge Summary (Signed)
Physician Discharge Summary  Jacob Gutierrez IRS:[id number] DOB: [birth year] DOA: 02/09/2021  PCP: Patient, No Pcp Per (Inactive)  Admit date: 02/09/2021 Discharge date: 02/15/2021  Admitted From: Home Disposition: Home  Recommendations for Outpatient Follow-up:  Follow up with PCP in 1-2 weeks Please obtain BMP/CBC in one week your next doctors visit.  Midodrine 10 mg 3 times daily Advised to remain compliant with this all medications especially his HIV regimen Follow-up outpatient infectious disease Take oral fluconazole as prescribed    Discharge Condition: Stable CODE STATUS: Full code Diet recommendation: Regular  Brief/Interim Summary: 35 year old male with HIV/AIDS (CD4 less than 35 and viral load 978K in March), nonadherence to anti-HIV regimen, history of treated syphilis who was admitted to the hospital with fever, chills, headaches, nausea and vomiting.  He was doing fairly well up until the day prior to admission and all of a sudden felt quite sick and came to the hospital.  He was febrile in the ED to 39.4, tachycardic, chest x-ray was unremarkable.  Head CT showed stable chronic encephalomalacia without anything acute, UDS showed amphetamines and THC.  COVID-19 was negative.  He was placed on broad-spectrum antibiotics and was admitted to the hospital.  Patient refused an LP.  Found to have sepsis secondary to ESBL pyelonephritis and acute kidney injury.  AKI improved with Foley catheter placement and IV antibiotics.  Urology team was consulted who suggested conservative management with Foley catheter and IV antibiotics for now.  Infectious disease team following as well.  Patient completed 5 days of IV meropenem in the hospital, he will follow-up outpatient infectious disease.  Advised to remain compliant with his medication.  He is also prescribed oral fluconazole for his thrush. Patient has been counseled to remain compliant with his medications.    Severe sepsis secondary  to ESBL pyelonephritis - Sepsis physio improved. Noncompliance to his antiviral therapy.  Infectious work-up including CT abdomen pelvis showed right-sided pyelonephritis with mild hydronephrosis. - Completed course of IV meropenem.  Continue home oral Bactrim.  Oral fluconazole prescribed for 7 more days as well.  Acute kidney injury with right-sided hydronephrosis - This is resolved after Foley catheter placement.  Creatinine now at baseline 0.8.  He is voiding well without any issues.  Foley catheter has been removed.  Follow-up outpatient with PCP  HIV/AIDS - Patient is noncompliant with his therapy.  Infectious disease following.  Resume his home regimen.          Body mass index is 23.09 kg/m.         Discharge Diagnoses:  Principal Problem:   SIRS (systemic inflammatory response syndrome) (HCC) Active Problems:   Human immunodeficiency virus (HIV) disease (HCC)   AIDS (acquired immune deficiency syndrome) ([place])   Thrush   Fever   Hyponatremia   Pyelonephritis   Hypokalemia   AKI (acute kidney injury) ([place])   Bilious vomiting with nausea   Obstructive uropathy   Prerenal renal failure   Nonadherence to medical treatment   ESBL (extended spectrum beta-lactamase) producing bacteria infection      Consultations: Urology Infectious disease Subjective: Feels great wants to go home.  No complaints.  Discharge Exam: Vitals:   02/15/21 0415 02/15/21 0801  BP: 106/74 101/63  Pulse: 70 63  Resp: 18 19  Temp: 97.7 F (36.5 C) 97.9 F (36.6 C)  SpO2: 100% 99%   Vitals:   02/14/21 1249 02/14/21 2249 02/15/21 0415 02/15/21 0801  BP: 115/82 111/67 106/74 101/63  Pulse: (!) 55 64 70  63  Resp: '16 20 18 19  ' Temp: 98.1 F (36.7 C) 98.4 F (36.9 C) 97.7 F (36.5 C) 97.9 F (36.6 C)  TempSrc: Oral Oral Oral Oral  SpO2: 100% 100% 100% 99%  Weight:      Height:        General: Pt is alert, awake, not in acute distress Cardiovascular: RRR, S1/S2 +, no  rubs, no gallops Respiratory: CTA bilaterally, no wheezing, no rhonchi Abdominal: Soft, NT, ND, bowel sounds + Extremities: no edema, no cyanosis  Discharge Instructions  Discharge Instructions     [facility] pharmacist to adjust dose for Vancomycin, Aminoglycosides and other anti-infective therapies as requested by physician.   Complete by: As directed    [facility] to provide Cath Flo 17m   Complete by: As directed    Administer for PICC line occlusion and as ordered by physician for other access device issues.   Anaphylaxis Kit: Provided to treat any anaphylactic reaction to the medication being provided to the patient if First Dose or when requested by physician   Complete by: As directed    Epinephrine 166mml vial / amp: Administer 0.17m28m0.17ml60mubcutaneously once for moderate to severe anaphylaxis, nurse to call physician and pharmacy when reaction occurs and call 911 if needed for immediate care   Diphenhydramine 50mg6mIV vial: Administer 25-50mg 58mM PRN for first dose reaction, rash, itching, mild reaction, nurse to call physician and pharmacy when reaction occurs   Sodium Chloride 0.9% NS 500ml I57mdminister if needed for hypovolemic blood pressure drop or as ordered by physician after call to physician with anaphylactic reaction   Change dressing on IV access line weekly and PRN   Complete by: As directed    Flush IV access with Sodium Chloride 0.9% and Heparin 10 units/ml or 100 units/ml   Complete by: As directed    Home infusion instructions - [facility]   Complete by: As directed    Instructions: Flush IV access with Sodium Chloride 0.9% and Heparin 10units/ml or 100units/ml   Change dressing on IV access line: Weekly and PRN   Instructions Cath Flo 2mg: Ad27mister for PICC Line occlusion and as ordered by physician for other access device   [facility] pharmacist to adjust dose for: Vancomycin, Aminoglycosides and other  anti-infective therapies as requested by physician   Method of administration may be changed at the discretion of home infusion pharmacist based upon assessment of the patient and/or caregiver's ability to self-administer the medication ordered   Complete by: As directed       Allergies as of 02/15/2021       Reactions   Vancomycin Anaphylaxis, Itching, Swelling, Other (See Comments)   Angioedema and "everything swells"   Amoxicillin Other (See Comments)   From childhood: "I had a reaction when i was little." (??)        Medication List     STOP taking these medications    azithromycin 500 MG tablet Commonly known as: ZITHROMAX   Creon 36000 UNITS Cpep capsule Generic drug: lipase/protease/amylase   ferrous sulfate 324 (65 Fe) MG Tbec   lipase/protease/amylase 36000 UNITS Cpep capsule Commonly known as: Creon   loperamide 2 MG capsule Commonly known as: IMODIUM   metoCLOPramide 10 MG tablet Commonly known as: REGLAN   nitrofurantoin (macrocrystal-monohydrate) 100 MG capsule Commonly known as: MACROBID   omeprazole 20 MG capsule Commonly known as: PRILOSEC   pantoprazole 40 MG tablet Commonly known as: PROTONIX  potassium chloride SA 20 MEQ tablet Commonly known as: KLOR-CON       TAKE these medications    fluconazole 200 MG tablet Commonly known as: DIFLUCAN Take 1 tablet (200 mg total) by mouth daily. What changed:  how much to take how to take this when to take this   midodrine 10 MG tablet Commonly known as: PROAMATINE Take 1 tablet (10 mg total) by mouth 3 (three) times daily with meals.   sulfamethoxazole-trimethoprim 800-160 MG tablet Commonly known as: BACTRIM DS Take 1 tablet by mouth 3 (three) times a week. What changed:  when to take this Another medication with the same name was removed. Continue taking this medication, and follow the directions you see here.   Symtuza 800-150-200-10 MG Tabs Generic drug:  Darunavir-Cobicisctat-Emtricitabine-Tenofovir Alafenamide Take 1 tablet by mouth daily with breakfast.       ASK your doctor about these medications    Creon 36000 UNITS Cpep capsule Generic drug: lipase/protease/amylase TAKE 2 CAPSULES (72,000 UNITS) BY MOUTH WITH EVERY MEAL, TAKE 1 CAPSULE (36,000 UNITS) BY MOUTH WITH EVERY SNACK.   FeroSul 325 (65 FE) MG tablet Generic drug: ferrous sulfate TAKE 1 TABLET (325 MG TOTAL) BY MOUTH DAILY AFTER BREAKFAST.   loperamide 2 MG tablet Commonly known as: Imodium A-D Take 1 tablet (2 mg total) by mouth every 8 (eight) hours.   pantoprazole 40 MG tablet Commonly known as: PROTONIX TAKE 1 TABLET (40 MG TOTAL) BY MOUTH 2 (TWO) TIMES DAILY.   potassium chloride 10 MEQ tablet Commonly known as: KLOR-CON TAKE 2 TABLETS BY MOUTH DAILY.   Symtuza 800-150-200-10 MG Tabs Generic drug: Darunavir-Cobicisctat-Emtricitabine-Tenofovir Alafenamide TAKE 1 TABLET BY MOUTH DAILY WITH BREAKFAST.               Discharge Care Instructions  (From admission, onward)           Start     Ordered   02/09/21 0000  Change dressing on IV access line weekly and PRN  (Home infusion instructions - [facility] )        02/09/21 1915            Follow-up Information     [name], Okey Regal, MD Follow up in 1 week(s).   Specialty: Infectious Diseases Contact information: [address] [place] [address] [phone]                Allergies  Allergen Reactions   Vancomycin Anaphylaxis, Itching, Swelling and Other (See Comments)    Angioedema and "everything swells"   Amoxicillin Other (See Comments)    From childhood: "I had a reaction when i was little." (??)    You were cared for by a hospitalist during your hospital stay. If you have any questions about your discharge medications or the care you received while you were in the hospital after you are discharged, you can call the unit and asked to speak with  the hospitalist on call if the hospitalist that took care of you is not available. Once you are discharged, your primary care physician will handle any further medical issues. Please note that no refills for any discharge medications will be authorized once you are discharged, as it is imperative that you return to your primary care physician (or establish a relationship with a primary care physician if you do not have one) for your aftercare needs so that they can reassess your need for medications and monitor your lab values.   Procedures/Studies: CT  ABDOMEN PELVIS WO CONTRAST  Result Date: 02/11/2021 CLINICAL DATA:  Diffuse abdominal pain with vomiting. EXAM: CT ABDOMEN AND PELVIS WITHOUT CONTRAST TECHNIQUE: Multidetector CT imaging of the abdomen and pelvis was performed following the standard protocol without IV contrast. COMPARISON:  10/15/2020 FINDINGS: Lower chest: Interval development of ground-glass and collapse/consolidative opacity in the dependent lung bases bilaterally, right greater than left. Findings probably reflect atelectasis with superimposed infection cannot be entirely excluded. Hepatobiliary: No focal abnormality in the liver on this study without intravenous contrast. There is no evidence for gallstones, gallbladder wall thickening, or pericholecystic fluid. No intrahepatic or extrahepatic biliary dilation. Pancreas: No focal mass lesion. No dilatation of the main duct. No intraparenchymal cyst. No peripancreatic edema. Spleen: No splenomegaly. No focal mass lesion. Adrenals/Urinary Tract: No adrenal nodule or mass. Right kidney appears edematous with fullness of the intrarenal collecting system and proximal right ureter. Subtle changes of proximal periureteric edema noted. Left kidney unremarkable on noncontrast imaging. Urine in the bladder has increased attenuation, likely reflecting excreted gadolinium contrast from brain MRI yesterday. Stomach/Bowel: Small hiatal hernia with mild  circumferential wall thickening noted distal esophagus stomach otherwise unremarkable. No small bowel wall thickening. No small bowel dilatation. Neither the terminal ileum nor the appendix are discretely visible on this study without oral or intravenous contrast material. Colon is nondistended. Vascular/Lymphatic: No abdominal aortic aneurysm. There is no gastrohepatic or hepatoduodenal ligament lymphadenopathy. Para-aortic retroperitoneal lymphadenopathy again noted, similar to prior. 12 mm short axis left para-aortic node on 46/3 was 10 mm previously. Reproductive: The prostate gland and seminal vesicles are unremarkable. Other: No intraperitoneal free fluid. Musculoskeletal: No worrisome lytic or sclerotic osseous abnormality. IMPRESSION: 1. Interval development of ground-glass and collapse/consolidative opacity in the dependent lung bases bilaterally, right greater than left. Findings probably reflect atelectasis with superimposed infection cannot be entirely excluded. 2. Right kidney appears enlarged and edematous with peripelvic and proximal periureteric edema. Right intrarenal collecting system and proximal right ureter are distended. Assessment limited by the lack of intravenous contrast material, but given relatively normal appearance on CT from 4 months ago and lack of urinary stone disease, imaging features may be related to pyelonephritis. 3. Similar appearance of retroperitoneal lymphadenopathy in this patient with reported history of HIV. 4. Small hiatal hernia with mild circumferential wall thickening distal esophagus. Esophagitis could have this appearance. Electronically Signed   By: Misty Stanley M.D.   On: 02/11/2021 10:57   DG Chest 2 View  Result Date: 02/09/2021 CLINICAL DATA:  Cough. EXAM: CHEST - 2 VIEW COMPARISON:  CT 10/15/2020.  Chest x-ray 08/07/2020, 09/19/2018. FINDINGS: Mediastinum hilar structures normal. Heart size normal. Stable mild chronic interstitial prominence. No acute  alveolar infiltrate. No pleural effusion or pneumothorax. IMPRESSION: Stable chronic bilateral interstitial prominence. No acute cardiopulmonary disease identified. Electronically Signed   By: Marcello Moores  Register   On: 02/09/2021 13:22   CT Head Wo Contrast  Result Date: 02/09/2021 CLINICAL DATA:  Headache for 2 days, emesis EXAM: CT HEAD WITHOUT CONTRAST TECHNIQUE: Contiguous axial images were obtained from the base of the skull through the vertex without intravenous contrast. COMPARISON:  09/20/2018 FINDINGS: Brain: Chronic encephalomalacia within the left parietal region unchanged. No signs of acute infarct or hemorrhage. Lateral ventricles and midline structures are unremarkable. No acute extra-axial fluid collections. No mass effect. Vascular: No hyperdense vessel or unexpected calcification. Skull: Normal. Negative for fracture or focal lesion. Sinuses/Orbits: Mild mucosal thickening of the ethmoid and frontal sinuses. No gas fluid levels. Other: None. IMPRESSION: 1. Mild ethmoid  and frontal sinus disease. 2. Stable chronic encephalomalacia left parietal region. No acute process. Electronically Signed   By: Randa Ngo M.D.   On: 02/09/2021 20:15   MR BRAIN W WO CONTRAST  Result Date: 02/10/2021 CLINICAL DATA:  Transient ischemic attack. History of HIV and treated syphilis fever, chills, headache and nausea. EXAM: MRI HEAD WITHOUT AND WITH CONTRAST TECHNIQUE: Multiplanar, multiecho pulse sequences of the brain and surrounding structures were obtained without and with intravenous contrast. CONTRAST:  7.4m GADAVIST GADOBUTROL 1 MMOL/ML IV SOLN COMPARISON:  Head CT same day.  MRI 12/13/2017. FINDINGS: Brain: Diffusion imaging does not show any acute or subacute infarction. Brainstem and cerebellum are normal. Right cerebral hemispheres normal. There is old infarction in the medial left parietal lobe with atrophy, encephalomalacia and adjacent gliosis. No evidence of mass lesion, hemorrhage, hydrocephalus  or extra-axial collection. After contrast administration, no abnormal brain or leptomeningeal enhancement occurs. Vascular: Major vessels at the base of the brain show flow. Skull and upper cervical spine: Negative Sinuses/Orbits: Mild mucosal thickening of the sinuses, most pronounced at the right maxillary sinus. No layering fluid. Orbits negative Other: None IMPRESSION: No acute intracranial finding. 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 No acute or subacute finding. No evidence of intracranial inflammatory disease. Mild sinus mucosal inflammatory changes, without advanced sinusitis. Electronically Signed   By: MNelson ChimesM.D.   On: 02/10/2021 13:22   [place]NAL  Result Date: 02/11/2021 CLINICAL DATA:  Acute renal insufficiency EXAM: RENAL / URINARY TRACT ULTRASOUND COMPLETE COMPARISON:  02/11/2021 FINDINGS: Right Kidney: Renal measurements: 12.9 x 6.5 x 6.3 cm = volume: 277.2 mL. Echogenicity within normal limits. No evidence of renal mass. Trace right hydronephrosis. Left Kidney: Renal measurements: 13.1 x 6.5 x 6.8 cm = volume: 304.0 mL. Echogenicity within normal limits. No mass or hydronephrosis visualized. Bladder: Bladder is decompressed with a Foley catheter. Other: None. IMPRESSION: 1. Trace right hydronephrosis, with an improved appearance since prior study. 2. Choose otherwise unremarkable appearance of the bilateral kidneys. Electronically Signed   By: MRanda NgoM.D.   On: 02/11/2021 22:38     The results of significant diagnostics from this hospitalization (including imaging, microbiology, ancillary and laboratory) are listed below for reference.     Microbiology: Recent Results (from the past 240 hour(s))  Resp Panel by RT-PCR (Flu A&B, Covid) Nasopharyngeal Swab     Status: None   Collection Time: 02/09/21 12:59 PM   Specimen: Nasopharyngeal Swab; Nasopharyngeal(NP) swabs in vial transport medium  Result Value Ref Range Status   SARS  Coronavirus 2 by RT PCR NEGATIVE NEGATIVE Final    Comment: (NOTE) SARS-CoV-2 target nucleic acids are NOT DETECTED.  The SARS-CoV-2 RNA is generally detectable in upper respiratory specimens during the acute phase of infection. The lowest concentration of SARS-CoV-2 viral copies this assay can detect is 138 copies/mL. A negative result does not preclude SARS-Cov-2 infection and should not be used as the sole basis for treatment or other patient management decisions. A negative result may occur with  improper specimen collection/handling, submission of specimen other than nasopharyngeal swab, presence of viral mutation(s) within the areas targeted by this assay, and inadequate number of viral copies(<138 copies/mL). A negative result must be combined with clinical observations, patient history, and epidemiological information. The expected result is Negative.  Fact Sheet for Patients:  hEntrepreneurPulse.com.au Fact Sheet for Healthcare Providers:  hIncredibleEmployment.be This test is no t yet approved or cleared by the [place]  has been authorized for detection and/or diagnosis of SARS-CoV-2 by FDA under an Emergency Use Authorization (EUA). This EUA will remain  in effect (meaning this test can be used) for the duration of the COVID-19 declaration under Section 564(b)(1) of the Act, 21 U.S.C.section 360bbb-3(b)(1), unless the authorization is terminated  or revoked sooner.       Influenza A by PCR NEGATIVE NEGATIVE Final   Influenza B by PCR NEGATIVE NEGATIVE Final    Comment: (NOTE) The Xpert Xpress SARS-CoV-2/FLU/RSV plus assay is intended as an aid in the diagnosis of influenza from Nasopharyngeal swab specimens and should not be used as a sole basis for treatment. Nasal washings and aspirates are unacceptable for Xpert Xpress SARS-CoV-2/FLU/RSV testing.  Fact Sheet for  Patients: EntrepreneurPulse.com.au  Fact Sheet for Healthcare Providers: IncredibleEmployment.be  This test is not yet approved or cleared by the [place] FDA and has been authorized for detection and/or diagnosis of SARS-CoV-2 by FDA under an Emergency Use Authorization (EUA). This EUA will remain in effect (meaning this test can be used) for the duration of the COVID-19 declaration under Section 564(b)(1) of the Act, 21 U.S.C. section 360bbb-3(b)(1), unless the authorization is terminated or revoked.  Performed at [facility], [place] [address] [place], [place] [address]   Urine Culture     Status: Abnormal   Collection Time: 02/09/21  6:32 PM   Specimen: Urine, Clean Catch  Result Value Ref Range Status   Specimen Description URINE, CLEAN CATCH  Final   Special Requests   Final    Immunocompromised Performed at [facility], [place] [address], [place], [place] [address]    Culture (A)  Final    >=100,000 COLONIES/mL ESCHERICHIA COLI Confirmed Extended Spectrum Beta-Lactamase Producer (ESBL).  In bloodstream infections from ESBL organisms, carbapenems are preferred over piperacillin/tazobactam. They are shown to have a lower risk of mortality.    Report Status 02/12/2021 FINAL  Final   Organism ID, Bacteria ESCHERICHIA COLI (A)  Final      Susceptibility   Escherichia coli - MIC*    AMPICILLIN >=32 RESISTANT Resistant     CEFAZOLIN >=64 RESISTANT Resistant     CEFEPIME >=32 RESISTANT Resistant     CEFTRIAXONE >=64 RESISTANT Resistant     CIPROFLOXACIN >=4 RESISTANT Resistant     GENTAMICIN >=16 RESISTANT Resistant     IMIPENEM <=0.25 SENSITIVE Sensitive     NITROFURANTOIN <=16 SENSITIVE Sensitive     TRIMETH/SULFA >=320 RESISTANT Resistant     AMPICILLIN/SULBACTAM >=32 RESISTANT Resistant     PIP/TAZO 8 SENSITIVE Sensitive     * >=100,000 COLONIES/mL ESCHERICHIA COLI  Blood culture (routine x 2)     Status: None    Collection Time: 02/09/21  6:35 PM   Specimen: BLOOD  Result Value Ref Range Status   Specimen Description BLOOD RIGHT ANTECUBITAL  Final   Special Requests   Final    BOTTLES DRAWN AEROBIC AND ANAEROBIC Blood Culture adequate volume   Culture   Final    NO GROWTH 5 DAYS Performed at [facility], [address] [place], [place] [address]    Report Status 02/14/2021 FINAL  Final  Blood culture (routine x 2)     Status: None   Collection Time: 02/09/21  6:35 PM   Specimen: BLOOD  Result Value Ref Range Status   Specimen Description BLOOD LEFT ANTECUBITAL  Final   Special Requests   Final    BOTTLES DRAWN AEROBIC AND ANAEROBIC  Blood Culture adequate volume   Culture   Final    NO GROWTH 5 DAYS Performed at [facility], [place] [address] [place], [place] [address]    Report Status 02/14/2021 FINAL  Final  Acid Fast Smear (AFB)     Status: None   Collection Time: 02/10/21 11:25 PM   Specimen: Vein; Blood  Result Value Ref Range Status   AFB Specimen Processing Concentration  Final   Acid Fast Smear Negative  Final    Comment: (NOTE) Performed At: [facility] [address] [place], [place] [id number] Rush Farmer MD [id number]    Source (AFB) SPUTUM  Final    Comment: Performed at [facility], [place] [address], [place], [place] [address]     Labs: BNP (last 3 results) No results for input(s): BNP in the last 8760 hours. Basic Metabolic Panel: Recent Labs  Lab 02/10/21 0316 02/11/21 0158 02/12/21 0305 02/13/21 0223 02/14/21 0106 02/15/21 0357  NA 129* 129* 130* 134* 134* 135  K 3.5 3.5 3.7 4.4 3.6 3.9  CL 100 102 106 108 108 107  CO2 19* 15* 17* 17* 19* 22  GLUCOSE 111* 86 90 82 105* 99  BUN 17 25* 25* '17 10 8  ' CREATININE 1.23 2.62* 1.77* 1.21 0.95 0.82  CALCIUM 7.9* 7.3* 7.7* 7.9* 7.9* 8.5*  MG 1.9  --   --  1.9  --   --    Liver Function Tests: Recent Labs  Lab 02/09/21 1241 02/11/21 0158  AST 33 24  ALT 12 12  ALKPHOS 65  47  BILITOT 0.9 0.9  PROT 9.3* 6.9  ALBUMIN 2.9* 2.1*   Recent Labs  Lab 02/09/21 1241  LIPASE 33   Recent Labs  Lab 02/09/21 1818  AMMONIA 15   CBC: Recent Labs  Lab 02/09/21 1241 02/10/21 0316 02/11/21 0158 02/12/21 0305 02/13/21 0223  WBC 7.6 6.2 4.3 4.2 2.7*  NEUTROABS 5.9  --   --   --   --   HGB 11.3* 11.2* 9.2* 8.9* 10.0*  HCT 35.6* 35.1* 27.7* 26.0* 29.9*  MCV 94.9 95.4 91.4 90.0 90.1  PLT 199 161 158 170 178   Cardiac Enzymes: No results for input(s): CKTOTAL, CKMB, CKMBINDEX, TROPONINI in the last 168 hours. BNP: Invalid input(s): POCBNP CBG: No results for input(s): GLUCAP in the last 168 hours. D-Dimer No results for input(s): DDIMER in the last 72 hours. Hgb A1c No results for input(s): HGBA1C in the last 72 hours. Lipid Profile No results for input(s): CHOL, HDL, LDLCALC, TRIG, CHOLHDL, LDLDIRECT in the last 72 hours. Thyroid function studies No results for input(s): TSH, T4TOTAL, T3FREE, THYROIDAB in the last 72 hours.  Invalid input(s): FREET3 Anemia work up No results for input(s): VITAMINB12, FOLATE, FERRITIN, TIBC, IRON, RETICCTPCT in the last 72 hours. Urinalysis    Component Value Date/Time   COLORURINE YELLOW 02/11/2021 1746   APPEARANCEUR HAZY (A) 02/11/2021 1746   APPEARANCEUR Hazy 07/27/2013 1742   LABSPEC 1.013 02/11/2021 1746   LABSPEC 1.026 07/27/2013 1742   PHURINE 6.0 02/11/2021 1746   GLUCOSEU NEGATIVE 02/11/2021 1746   GLUCOSEU Negative 07/27/2013 1742   HGBUR SMALL (A) 02/11/2021 1746   BILIRUBINUR NEGATIVE 02/11/2021 1746   BILIRUBINUR Negative 07/27/2013 1742   KETONESUR NEGATIVE 02/11/2021 1746   PROTEINUR 30 (A) 02/11/2021 1746   UROBILINOGEN 1 12/17/2013 1118   NITRITE NEGATIVE 02/11/2021 1746   LEUKOCYTESUR SMALL (A) 02/11/2021 1746   LEUKOCYTESUR Negative 07/27/2013 1742   Sepsis  Labs Invalid input(s): PROCALCITONIN,  WBC,  LACTICIDVEN Microbiology Recent Results (from the past 240 hour(s))  Resp Panel by  RT-PCR (Flu A&B, Covid) Nasopharyngeal Swab     Status: None   Collection Time: 02/09/21 12:59 PM   Specimen: Nasopharyngeal Swab; Nasopharyngeal(NP) swabs in vial transport medium  Result Value Ref Range Status   SARS Coronavirus 2 by RT PCR NEGATIVE NEGATIVE Final    Comment: (NOTE) SARS-CoV-2 target nucleic acids are NOT DETECTED.  The SARS-CoV-2 RNA is generally detectable in upper respiratory specimens during the acute phase of infection. The lowest concentration of SARS-CoV-2 viral copies this assay can detect is 138 copies/mL. A negative result does not preclude SARS-Cov-2 infection and should not be used as the sole basis for treatment or other patient management decisions. A negative result may occur with  improper specimen collection/handling, submission of specimen other than nasopharyngeal swab, presence of viral mutation(s) within the areas targeted by this assay, and inadequate number of viral copies(<138 copies/mL). A negative result must be combined with clinical observations, patient history, and epidemiological information. The expected result is Negative.  Fact Sheet for Patients:  EntrepreneurPulse.com.au  Fact Sheet for Healthcare Providers:  IncredibleEmployment.be  This test is no t yet approved or cleared by the [place] FDA and  has been authorized for detection and/or diagnosis of SARS-CoV-2 by FDA under an Emergency Use Authorization (EUA). This EUA will remain  in effect (meaning this test can be used) for the duration of the COVID-19 declaration under Section 564(b)(1) of the Act, 21 U.S.C.section 360bbb-3(b)(1), unless the authorization is terminated  or revoked sooner.       Influenza A by PCR NEGATIVE NEGATIVE Final   Influenza B by PCR NEGATIVE NEGATIVE Final    Comment: (NOTE) The Xpert Xpress SARS-CoV-2/FLU/RSV plus assay is intended as an aid in the diagnosis of influenza from Nasopharyngeal swab  specimens and should not be used as a sole basis for treatment. Nasal washings and aspirates are unacceptable for Xpert Xpress SARS-CoV-2/FLU/RSV testing.  Fact Sheet for Patients: EntrepreneurPulse.com.au  Fact Sheet for Healthcare Providers: IncredibleEmployment.be  This test is not yet approved or cleared by the [place] FDA and has been authorized for detection and/or diagnosis of SARS-CoV-2 by FDA under an Emergency Use Authorization (EUA). This EUA will remain in effect (meaning this test can be used) for the duration of the COVID-19 declaration under Section 564(b)(1) of the Act, 21 U.S.C. section 360bbb-3(b)(1), unless the authorization is terminated or revoked.  Performed at [facility], [place] [address], [place], [place] [address]   Urine Culture     Status: Abnormal   Collection Time: 02/09/21  6:32 PM   Specimen: Urine, Clean Catch  Result Value Ref Range Status   Specimen Description URINE, CLEAN CATCH  Final   Special Requests   Final    Immunocompromised Performed at [facility], [place] [address], [place], [place] [address]    Culture (A)  Final    >=100,000 COLONIES/mL ESCHERICHIA COLI Confirmed Extended Spectrum Beta-Lactamase Producer (ESBL).  In bloodstream infections from ESBL organisms, carbapenems are preferred over piperacillin/tazobactam. They are shown to have a lower risk of mortality.    Report Status 02/12/2021 FINAL  Final   Organism ID, Bacteria ESCHERICHIA COLI (A)  Final      Susceptibility   Escherichia coli - MIC*    AMPICILLIN >=32 RESISTANT Resistant     CEFAZOLIN >=64 RESISTANT Resistant     CEFEPIME >=32 RESISTANT Resistant  CEFTRIAXONE >=64 RESISTANT Resistant     CIPROFLOXACIN >=4 RESISTANT Resistant     GENTAMICIN >=16 RESISTANT Resistant     IMIPENEM <=0.25 SENSITIVE Sensitive     NITROFURANTOIN <=16 SENSITIVE Sensitive     TRIMETH/SULFA >=320 RESISTANT Resistant      AMPICILLIN/SULBACTAM >=32 RESISTANT Resistant     PIP/TAZO 8 SENSITIVE Sensitive     * >=100,000 COLONIES/mL ESCHERICHIA COLI  Blood culture (routine x 2)     Status: None   Collection Time: 02/09/21  6:35 PM   Specimen: BLOOD  Result Value Ref Range Status   Specimen Description BLOOD RIGHT ANTECUBITAL  Final   Special Requests   Final    BOTTLES DRAWN AEROBIC AND ANAEROBIC Blood Culture adequate volume   Culture   Final    NO GROWTH 5 DAYS Performed at [facility], [place] [address], [place], [place] [address]    Report Status 02/14/2021 FINAL  Final  Blood culture (routine x 2)     Status: None   Collection Time: 02/09/21  6:35 PM   Specimen: BLOOD  Result Value Ref Range Status   Specimen Description BLOOD LEFT ANTECUBITAL  Final   Special Requests   Final    BOTTLES DRAWN AEROBIC AND ANAEROBIC Blood Culture adequate volume   Culture   Final    NO GROWTH 5 DAYS Performed at [facility], [place] [address] [place], [place] [address]    Report Status 02/14/2021 FINAL  Final  Acid Fast Smear (AFB)     Status: None   Collection Time: 02/10/21 11:25 PM   Specimen: Vein; Blood  Result Value Ref Range Status   AFB Specimen Processing Concentration  Final   Acid Fast Smear Negative  Final    Comment: (NOTE) Performed At: [facility] [address] [place], [place] [id number] Rush Farmer MD [id number]    Source (AFB) SPUTUM  Final    Comment: Performed at [facility], [place] [address], [place], [place] [address]     Time coordinating discharge:  I have spent 35 minutes face to face with the patient and on the ward discussing the patients care, assessment, plan and disposition with other care givers. >50% of the time was devoted counseling the patient about the risks and benefits of treatment/Discharge disposition and coordinating care.   SIGNED:   Damita Lack, MD  [facility] 02/15/2021, 11:46 AM   If 7PM-7AM, please contact  night-coverage

## 2021-02-26 LAB — HIV GENOSURE(R) MG

## 2021-02-26 LAB — REFLEX TO GENOSURE(R) MG EDI: HIV GenoSure(R): 1

## 2021-02-26 LAB — HIV-1 RNA, PCR (GRAPH) RFX/GENO EDI
HIV-1 RNA BY PCR: 477000 copies/mL
HIV-1 RNA Quant, Log: 5.679 log10copy/mL

## 2021-03-02 ENCOUNTER — Ambulatory Visit: Payer: Medicaid Other | Admitting: Family

## 2021-03-03 ENCOUNTER — Inpatient Hospital Stay (HOSPITAL_COMMUNITY)
Admission: EM | Admit: 2021-03-03 | Discharge: 2021-03-11 | DRG: 974 | Disposition: A | Discharge status: Home / Self Care | Payer: Medicaid Other | Attending: Internal Medicine | Admitting: Internal Medicine

## 2021-03-03 ENCOUNTER — Emergency Department (HOSPITAL_COMMUNITY): Payer: Medicaid Other

## 2021-03-03 ENCOUNTER — Encounter (HOSPITAL_COMMUNITY): Payer: Self-pay | Admitting: Emergency Medicine

## 2021-03-03 ENCOUNTER — Other Ambulatory Visit (HOSPITAL_COMMUNITY): Payer: Self-pay

## 2021-03-03 DIAGNOSIS — L89152 Pressure ulcer of sacral region, stage 2: Secondary | ICD-10-CM | POA: Diagnosis present

## 2021-03-03 DIAGNOSIS — Z881 Allergy status to other antibiotic agents status: Secondary | ICD-10-CM

## 2021-03-03 DIAGNOSIS — B20 Human immunodeficiency virus [HIV] disease: Secondary | ICD-10-CM | POA: Diagnosis present

## 2021-03-03 DIAGNOSIS — R6521 Severe sepsis with septic shock: Secondary | ICD-10-CM | POA: Diagnosis present

## 2021-03-03 DIAGNOSIS — A400 Sepsis due to streptococcus, group A: Secondary | ICD-10-CM

## 2021-03-03 DIAGNOSIS — N170 Acute kidney failure with tubular necrosis: Secondary | ICD-10-CM | POA: Diagnosis not present

## 2021-03-03 DIAGNOSIS — Z8673 Personal history of transient ischemic attack (TIA), and cerebral infarction without residual deficits: Secondary | ICD-10-CM

## 2021-03-03 DIAGNOSIS — Z1612 Extended spectrum beta lactamase (ESBL) resistance: Secondary | ICD-10-CM | POA: Diagnosis present

## 2021-03-03 DIAGNOSIS — B370 Candidal stomatitis: Secondary | ICD-10-CM | POA: Diagnosis not present

## 2021-03-03 DIAGNOSIS — A4151 Sepsis due to Escherichia coli [E. coli]: Principal | ICD-10-CM | POA: Diagnosis present

## 2021-03-03 DIAGNOSIS — Z8042 Family history of malignant neoplasm of prostate: Secondary | ICD-10-CM

## 2021-03-03 DIAGNOSIS — E861 Hypovolemia: Secondary | ICD-10-CM | POA: Diagnosis present

## 2021-03-03 DIAGNOSIS — N179 Acute kidney failure, unspecified: Secondary | ICD-10-CM

## 2021-03-03 DIAGNOSIS — Z880 Allergy status to penicillin: Secondary | ICD-10-CM

## 2021-03-03 DIAGNOSIS — D6959 Other secondary thrombocytopenia: Secondary | ICD-10-CM | POA: Diagnosis present

## 2021-03-03 DIAGNOSIS — Z8619 Personal history of other infectious and parasitic diseases: Secondary | ICD-10-CM

## 2021-03-03 DIAGNOSIS — Z21 Asymptomatic human immunodeficiency virus [HIV] infection status: Secondary | ICD-10-CM

## 2021-03-03 DIAGNOSIS — R1011 Right upper quadrant pain: Secondary | ICD-10-CM

## 2021-03-03 DIAGNOSIS — F17210 Nicotine dependence, cigarettes, uncomplicated: Secondary | ICD-10-CM | POA: Diagnosis present

## 2021-03-03 DIAGNOSIS — E162 Hypoglycemia, unspecified: Secondary | ICD-10-CM | POA: Diagnosis present

## 2021-03-03 DIAGNOSIS — A419 Sepsis, unspecified organism: Secondary | ICD-10-CM

## 2021-03-03 DIAGNOSIS — K219 Gastro-esophageal reflux disease without esophagitis: Secondary | ICD-10-CM | POA: Diagnosis present

## 2021-03-03 DIAGNOSIS — Z9079 Acquired absence of other genital organ(s): Secondary | ICD-10-CM

## 2021-03-03 DIAGNOSIS — B950 Streptococcus, group A, as the cause of diseases classified elsewhere: Secondary | ICD-10-CM

## 2021-03-03 DIAGNOSIS — K6289 Other specified diseases of anus and rectum: Secondary | ICD-10-CM

## 2021-03-03 DIAGNOSIS — I9589 Other hypotension: Secondary | ICD-10-CM | POA: Diagnosis present

## 2021-03-03 DIAGNOSIS — K810 Acute cholecystitis: Secondary | ICD-10-CM

## 2021-03-03 DIAGNOSIS — L8990 Pressure ulcer of unspecified site, unspecified stage: Secondary | ICD-10-CM | POA: Insufficient documentation

## 2021-03-03 DIAGNOSIS — Z9114 Patient's other noncompliance with medication regimen: Secondary | ICD-10-CM

## 2021-03-03 DIAGNOSIS — Z833 Family history of diabetes mellitus: Secondary | ICD-10-CM

## 2021-03-03 DIAGNOSIS — Z9119 Patient's noncompliance with other medical treatment and regimen: Secondary | ICD-10-CM

## 2021-03-03 DIAGNOSIS — Z20822 Contact with and (suspected) exposure to covid-19: Secondary | ICD-10-CM | POA: Diagnosis present

## 2021-03-03 DIAGNOSIS — R54 Age-related physical debility: Secondary | ICD-10-CM | POA: Diagnosis present

## 2021-03-03 DIAGNOSIS — E872 Acidosis, unspecified: Secondary | ICD-10-CM | POA: Diagnosis present

## 2021-03-03 DIAGNOSIS — Z79899 Other long term (current) drug therapy: Secondary | ICD-10-CM

## 2021-03-03 DIAGNOSIS — I10 Essential (primary) hypertension: Secondary | ICD-10-CM | POA: Diagnosis present

## 2021-03-03 DIAGNOSIS — Z8249 Family history of ischemic heart disease and other diseases of the circulatory system: Secondary | ICD-10-CM

## 2021-03-03 DIAGNOSIS — E871 Hypo-osmolality and hyponatremia: Secondary | ICD-10-CM

## 2021-03-03 LAB — CBC WITH DIFFERENTIAL/PLATELET
Abs Immature Granulocytes: 0 10*3/uL (ref 0.00–0.07)
Basophils Absolute: 0 10*3/uL (ref 0.0–0.1)
Basophils Relative: 1 %
Eosinophils Absolute: 0.1 10*3/uL (ref 0.0–0.5)
Eosinophils Relative: 4 %
HCT: 43.6 % (ref 39.0–52.0)
Hemoglobin: 14.4 g/dL (ref 13.0–17.0)
Lymphocytes Relative: 8 %
Lymphs Abs: 0.2 10*3/uL — ABNORMAL LOW (ref 0.7–4.0)
MCH: 30.6 pg (ref 26.0–34.0)
MCHC: 33 g/dL (ref 30.0–36.0)
MCV: 92.6 fL (ref 80.0–100.0)
Monocytes Absolute: 0.2 10*3/uL (ref 0.1–1.0)
Monocytes Relative: 10 %
Neutro Abs: 1.7 10*3/uL (ref 1.7–7.7)
Neutrophils Relative %: 77 %
Platelets: UNDETERMINED 10*3/uL (ref 150–400)
RBC: 4.71 MIL/uL (ref 4.22–5.81)
RDW: 16.4 % — ABNORMAL HIGH (ref 11.5–15.5)
WBC: 2.2 10*3/uL — ABNORMAL LOW (ref 4.0–10.5)
nRBC: 0 % (ref 0.0–0.2)

## 2021-03-03 LAB — COMPREHENSIVE METABOLIC PANEL
ALT: 26 U/L (ref 0–44)
AST: 72 U/L — ABNORMAL HIGH (ref 15–41)
Albumin: 2.9 g/dL — ABNORMAL LOW (ref 3.5–5.0)
Alkaline Phosphatase: 71 U/L (ref 38–126)
Anion gap: 19 — ABNORMAL HIGH (ref 5–15)
BUN: 33 mg/dL — ABNORMAL HIGH (ref 6–20)
CO2: 14 mmol/L — ABNORMAL LOW (ref 22–32)
Calcium: 8.1 mg/dL — ABNORMAL LOW (ref 8.9–10.3)
Chloride: 93 mmol/L — ABNORMAL LOW (ref 98–111)
Creatinine, Ser: 3.18 mg/dL — ABNORMAL HIGH (ref 0.61–1.24)
GFR, Estimated: 25 mL/min — ABNORMAL LOW (ref >60)
Glucose, Bld: 65 mg/dL — ABNORMAL LOW (ref 70–99)
Potassium: 3.5 mmol/L (ref 3.5–5.1)
Sodium: 126 mmol/L — ABNORMAL LOW (ref 135–145)
Total Bilirubin: 2.9 mg/dL — ABNORMAL HIGH (ref 0.3–1.2)
Total Protein: 9.6 g/dL — ABNORMAL HIGH (ref 6.5–8.1)

## 2021-03-03 LAB — LACTIC ACID, PLASMA: Lactic Acid, Venous: 5.7 mmol/L (ref 0.5–1.9)

## 2021-03-03 LAB — RESP PANEL BY RT-PCR (FLU A&B, COVID) ARPGX2
Influenza A by PCR: NEGATIVE
Influenza B by PCR: NEGATIVE
SARS Coronavirus 2 by RT PCR: NEGATIVE

## 2021-03-03 LAB — LIPASE, BLOOD: Lipase: 27 U/L (ref 11–51)

## 2021-03-03 MED ORDER — ONDANSETRON HCL 4 MG/2ML IJ SOLN
4.0000 mg | Freq: Once | INTRAMUSCULAR | Status: AC
  Administered 2021-03-03: 4 mg via INTRAVENOUS
  Filled 2021-03-03: qty 2

## 2021-03-03 MED ORDER — METOCLOPRAMIDE HCL 5 MG/ML IJ SOLN
5.0000 mg | Freq: Once | INTRAMUSCULAR | Status: AC
  Administered 2021-03-03: 5 mg via INTRAVENOUS
  Filled 2021-03-03: qty 2

## 2021-03-03 MED ORDER — ACETAMINOPHEN 500 MG PO TABS: 1000.0000 mg | ORAL_TABLET | Freq: Once | ORAL | Status: DC

## 2021-03-03 MED ORDER — ACETAMINOPHEN 325 MG PO TABS
650.0000 mg | ORAL_TABLET | Freq: Once | ORAL | Status: AC
  Administered 2021-03-03: 650 mg via ORAL
  Filled 2021-03-03: qty 2

## 2021-03-03 MED ORDER — LACTATED RINGERS IV BOLUS
1000.0000 mL | Freq: Once | INTRAVENOUS | Status: AC
  Administered 2021-03-03: 1000 mL via INTRAVENOUS

## 2021-03-03 MED ORDER — DIPHENHYDRAMINE HCL 50 MG/ML IJ SOLN
25.0000 mg | Freq: Once | INTRAMUSCULAR | Status: AC
  Administered 2021-03-03: 25 mg via INTRAVENOUS
  Filled 2021-03-03: qty 1

## 2021-03-03 MED ORDER — MORPHINE SULFATE (PF) 4 MG/ML IV SOLN
4.0000 mg | Freq: Once | INTRAVENOUS | Status: AC
Start: 2021-03-03 — End: 2021-03-03
  Administered 2021-03-03: 4 mg via INTRAVENOUS
  Filled 2021-03-03: qty 1

## 2021-03-03 MED ORDER — SODIUM CHLORIDE 0.9 % IV BOLUS
1000.0000 mL | Freq: Once | INTRAVENOUS | Status: AC
  Administered 2021-03-03: 1000 mL via INTRAVENOUS

## 2021-03-03 MED ORDER — SODIUM CHLORIDE 0.9 % IV SOLN
1.0000 g | Freq: Two times a day (BID) | INTRAVENOUS | Status: DC
  Administered 2021-03-03: 1 g via INTRAVENOUS
  Filled 2021-03-03 (×3): qty 1

## 2021-03-03 MED ORDER — LACTATED RINGERS IV SOLN: INTRAVENOUS | Status: DC

## 2021-03-03 NOTE — Progress Notes (Signed)
Pharmacy Antibiotic Note  Jacob Gutierrez is a 35 y.o. 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  Pharmacy has been consulted for meropenem dosing.  History is significant for HIV and ESBL infection. Patient presenting with N/V and diarrhea x 3 days.  Urine culture from 01/2021 with ESBL E. Coli (Imipenem, Nitrofurantoin, and Pip/Tazo (S)).  COVID in progress.  Plan: Meropenem 1g q12h Monitor cultures and de-escalate as appropriate Monitor renal function     Temp (24hrs), Avg:99.3 F (37.4 C), Min:98.1 F (36.7 C), Max:100.4 F (38 C)  Recent Labs  Lab 03/03/21 1901  CREATININE 3.18*    CrCl cannot be calculated (Unknown ideal weight.).    Allergies  Allergen Reactions   Vancomycin Anaphylaxis, Itching, Swelling and Other (See Comments)    Angioedema and "everything swells"   Amoxicillin Other (See Comments)    From childhood: "I had a reaction when i was little." (??)    Antimicrobials this admission: meropenem 8/10 >>   Microbiology results: Pending  Thank you for allowing pharmacy to be a part of this patient's care.  Cathie Hoops 03/03/2021 8:49 PM

## 2021-03-03 NOTE — ED Provider Notes (Signed)
[facility] EMERGENCY DEPARTMENT Provider Note   CSN: [id number] Arrival date & time: 03/03/21  5102     History No chief complaint on file.   Jacob Gutierrez is a 35 y.o. male.  35 yo male with history as below, significant for HIV with variable anti-viral compliance and unsure of last CD4, ESBL, presents to ED for evaluation of N/v/diarrhea.  Patient reports onset of symptoms approximately 3 days ago.  Difficulty telling oral intake secondary to nausea.  He has generalized abdominal discomfort.  Worsened with vomiting.  No hematemesis.  Also endorses diarrhea.  No black-colored stool or blood per rectum.  Chills, no fevers. Reduced urination, no dysuria. Intermittent cough. Pain worsened to his flanks, he is concerned that his "kidneys are acting up again."  Patient reports he has been taking his HIV medication since being discharged in the hospital.  Recent hospitalization within last 30 days notes reviewed.  The history is provided by the patient. No language interpreter was used.      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 [facility])    H/O coccidioidomycosis    pulmonary    HIV disease (HCC)    Past history of allergy to penicillin-type antibiotic 07-03-2012   desensitization   PNA (pneumonia)    Shigella gastroenteritis    Syphilis    history /treated     Patient Active Problem List   Diagnosis Date Noted   ESBL (extended spectrum beta-lactamase) producing bacteria infection    Obstructive uropathy    Prerenal renal failure    Nonadherence to medical treatment    AKI (acute kidney injury) (HCC) 02/10/2021   Loose stools    Bilious vomiting with nausea    SIRS (systemic inflammatory response syndrome) (HCC) 02/09/2021   Hypokalemia 10/15/2020   Nausea, vomiting, and diarrhea  10/15/2020   Colitis 10/15/2020   Dysphagia 10/15/2020   COVID-19 virus infection 08/25/2020   Dark urine 08/25/2020   Healthcare maintenance 07/23/2020   Pyelonephritis 02/13/2019   Prostatitis 10/05/2018   Leukopenia 09/20/2018   Hyponatremia 09/20/2018   Avoidance coping 09/07/2018   Fever 04/28/2018   Headache 04/27/2018   Condyloma 04/24/2018   Protein-calorie malnutrition, severe 12/12/2017   Personal history of MRSA (methicillin resistant Staphylococcus aureus) 12/09/2017   History of ESBL E. coli infection 12/09/2017   Thrush 12/09/2017   Diarrhea 03/05/2017   AIDS (acquired immune deficiency syndrome) (HCC)    Homelessness    Dysplasia of anus 01/02/2014   Tobacco use disorder 01/02/2014   Human immunodeficiency virus (HIV) disease (HCC) 01/01/2014    Past Surgical History:  Procedure Laterality Date   BIOPSY  10/16/2020   Procedure: BIOPSY;  Surgeon: Lemar Lofty., MD;  Location: [facility] ENDOSCOPY;  Service: Gastroenterology;;   BIOPSY  10/23/2020   Procedure: BIOPSY;  Surgeon: Lynann Bologna, MD;  Location: [facility] ENDOSCOPY;  Service: Endoscopy;;   COLONOSCOPY WITH PROPOFOL N/A 10/23/2020   Procedure: COLONOSCOPY WITH PROPOFOL;  Surgeon: Lynann Bologna, MD;  Location: [facility] ENDOSCOPY;  Service: Endoscopy;  Laterality: N/A;   ESOPHAGOGASTRODUODENOSCOPY (EGD) WITH PROPOFOL N/A 10/16/2020   Procedure: ESOPHAGOGASTRODUODENOSCOPY (EGD) WITH PROPOFOL;  Surgeon: Meridee Score Netty Starring., MD;  Location: [facility] ENDOSCOPY;  Service: Gastroenterology;  Laterality: N/A;   TRANSURETHRAL RESECTION OF PROSTATE N/A 12/09/2017   Procedure: TRANSURETHRAL RESECTION OF THE PROSTATE (TURP);  Surgeon: Marlou Porch,  Earle Gell, MD;  Location: [facility] ORS;  Service: Urology;  Laterality: N/A;       Family History  Problem Relation Age of Onset   Hypertension Mother    Lupus Maternal Grandmother    Diabetes Maternal Grandmother    Cancer Paternal Grandfather        unknown    Prostate cancer Father     Diabetes Father    Diabetes Maternal Grandfather    Colon cancer Neg Hx    Stomach cancer Neg Hx    Esophageal cancer Neg Hx    Pancreatic cancer Neg Hx     Social History   Tobacco Use   Smoking status: Every Day    Packs/day: 0.30    Types: Cigarettes   Smokeless tobacco: Never   Tobacco comments:    cutting back  Vaping Use   Vaping Use: Never used  Substance Use Topics   Alcohol use: Not Currently    Alcohol/week: 1.0 standard drink    Types: 1 Standard drinks or equivalent per week    Comment: whiskey occasionally    Drug use: Not Currently    Frequency: 1.0 times per week    Types: Marijuana    Home Medications Prior to Admission medications   Medication Sig Start Date End Date Taking? Authorizing Provider  Darunavir-Cobicisctat-Emtricitabine-Tenofovir Alafenamide (SYMTUZA) 800-150-200-10 MG TABS Take 1 tablet by mouth daily with breakfast. 10/23/20  Yes Ghimire, Werner Lean, MD  fluconazole (DIFLUCAN) 200 MG tablet Take 1 tablet (200 mg total) by mouth daily. 02/15/21  Yes Amin, Ankit Chirag, MD  midodrine (PROAMATINE) 10 MG tablet Take 1 tablet (10 mg total) by mouth 3 (three) times daily with meals. 02/15/21  Yes Amin, Loura Halt, MD  sulfamethoxazole-trimethoprim (BACTRIM DS) 800-160 MG tablet Take 1 tablet by mouth 3 (three) times a week. Patient taking differently: Take 1 tablet by mouth daily. 02/15/21  Yes Amin, Loura Halt, MD  Darunavir-Cobicisctat-Emtricitabine-Tenofovir Alafenamide (SYMTUZA) 800-150-200-10 MG TABS TAKE 1 TABLET BY MOUTH DAILY WITH BREAKFAST. Patient not taking: No sig reported 10/23/20 10/23/21  Maretta Bees, MD  ferrous sulfate 325 (65 FE) MG tablet TAKE 1 TABLET (325 MG TOTAL) BY MOUTH DAILY AFTER BREAKFAST. Patient not taking: No sig reported 10/23/20 10/23/21  Ghimire, Werner Lean, MD  lipase/protease/amylase (CREON) 36000 UNITS CPEP capsule TAKE 2 CAPSULES (72,000 UNITS) BY MOUTH WITH EVERY MEAL, TAKE 1 CAPSULE (36,000 UNITS) BY MOUTH WITH  EVERY SNACK. Patient not taking: No sig reported 09/25/20 09/25/21  Unk Lightning, PA  loperamide (IMODIUM A-D) 2 MG tablet Take 1 tablet (2 mg total) by mouth every 8 (eight) hours. Patient not taking: No sig reported 09/18/20   Unk Lightning, PA  pantoprazole (PROTONIX) 40 MG tablet TAKE 1 TABLET (40 MG TOTAL) BY MOUTH 2 (TWO) TIMES DAILY. Patient not taking: No sig reported 10/23/20 10/23/21  Ghimire, Werner Lean, MD  potassium chloride (KLOR-CON) 10 MEQ tablet TAKE 2 TABLETS BY MOUTH DAILY. Patient not taking: No sig reported 10/14/20 10/14/21  Anders Simmonds, PA-C    Allergies    Vancomycin and Amoxicillin  Review of Systems   Review of Systems  Constitutional:  Positive for chills and fatigue. Negative for fever.  HENT:  Negative for facial swelling and trouble swallowing.   Eyes:  Negative for photophobia and visual disturbance.  Respiratory:  Negative for shortness of breath and wheezing.   Cardiovascular:  Negative for chest pain and palpitations.  Gastrointestinal:  Positive for abdominal pain, diarrhea, nausea and  vomiting.  Endocrine: Negative for polydipsia and polyuria.  Genitourinary:  Negative for difficulty urinating and hematuria.  Musculoskeletal:  Negative for gait problem and joint swelling.  Skin:  Negative for pallor and rash.  Neurological:  Negative for syncope and headaches.  Psychiatric/Behavioral:  Negative for agitation and confusion.    Physical Exam Updated Vital Signs BP 104/83   Pulse (!) 145   Temp (!) 100.4 F (38 C) (Oral)   Resp (!) 27   Wt 79.4 kg   SpO2 97%   BMI 23.09 kg/m   Physical Exam Vitals and nursing note reviewed.  Constitutional:      General: He is not in acute distress.    Appearance: He is well-developed.     Comments: Frail appearing  HENT:     Head: Normocephalic and atraumatic.     Right Ear: External ear normal.     Left Ear: External ear normal.     Mouth/Throat:     Mouth: Mucous membranes are moist.   Eyes:     General: No scleral icterus. Cardiovascular:     Rate and Rhythm: Regular rhythm. Tachycardia present.     Pulses: Normal pulses.     Heart sounds: Normal heart sounds.  Pulmonary:     Effort: Pulmonary effort is normal. No respiratory distress.     Breath sounds: Normal breath sounds. No wheezing or rhonchi.  Abdominal:     General: Abdomen is flat.     Palpations: Abdomen is soft.     Tenderness: There is generalized abdominal tenderness.  Musculoskeletal:        General: Normal range of motion.     Cervical back: Normal range of motion.     Right lower leg: No edema.     Left lower leg: No edema.  Skin:    General: Skin is warm and dry.     Capillary Refill: Capillary refill takes less than 2 seconds.  Neurological:     Mental Status: He is alert and oriented to person, place, and time.  Psychiatric:        Mood and Affect: Mood normal.        Behavior: Behavior normal.    ED Results / Procedures / Treatments   Labs (all labs ordered are listed, but only abnormal results are displayed) Labs Reviewed  CBC WITH DIFFERENTIAL/PLATELET - Abnormal; Notable for the following components:      Result Value   WBC 2.2 (*)    RDW 16.4 (*)    Lymphs Abs 0.2 (*)    All other components within normal limits  COMPREHENSIVE METABOLIC PANEL - Abnormal; Notable for the following components:   Sodium 126 (*)    Chloride 93 (*)    CO2 14 (*)    Glucose, Bld 65 (*)    BUN 33 (*)    Creatinine, Ser 3.18 (*)    Calcium 8.1 (*)    Total Protein 9.6 (*)    Albumin 2.9 (*)    AST 72 (*)    Total Bilirubin 2.9 (*)    GFR, Estimated 25 (*)    Anion gap 19 (*)    All other components within normal limits  LACTIC ACID, PLASMA - Abnormal; Notable for the following components:   Lactic Acid, Venous 5.7 (*)    All other components within normal limits  RESP PANEL BY RT-PCR (FLU A&B, COVID) ARPGX2  CULTURE, BLOOD (ROUTINE X 2)  CULTURE, BLOOD (ROUTINE X 2)  LIPASE, BLOOD  URINALYSIS, ROUTINE W REFLEX MICROSCOPIC  RAPID URINE DRUG SCREEN, HOSP PERFORMED  LACTIC ACID, PLASMA    EKG EKG Interpretation  Date/Time:  Wednesday March 03 2021 19:10:42 EDT Ventricular Rate:  128 PR Interval:  138 QRS Duration: 103 QT Interval:  306 QTC Calculation: 447 R Axis:   39 Text Interpretation: Sinus tachycardia Similar to prior tracing Confirmed by Tanda Rockers ([id number]) on 03/03/2021 7:16:16 PM  Radiology CT ABDOMEN PELVIS WO CONTRAST  Result Date: 03/03/2021 CLINICAL DATA:  Acute abdominal pain. Possible sepsis. History of HIV. EXAM: CT ABDOMEN AND PELVIS WITHOUT CONTRAST TECHNIQUE: Multidetector CT imaging of the abdomen and pelvis was performed following the standard protocol without IV contrast. COMPARISON:  February 11, 2021 FINDINGS: Lower chest: Right lower lobe ground-glass nodules measuring up to 6 mm, likely infectious or inflammatory. Hepatobiliary: Unremarkable noncontrast appearance of the hepatic parenchyma. Mild periportal edema. The gallbladder is dilated with an wall thickening pericholecystic fluid. No biliary ductal dilation. Pancreas: Peripancreatic fluid with unremarkable noncontrast appearance the pancreatic parenchyma. No pancreatic ductal dilation. Spleen: Within normal limits. Adrenals/Urinary Tract: Adrenal glands are unremarkable. Kidneys are normal, without renal calculi, contour deforming lesion, or hydronephrosis. Bladder is unremarkable degree of distension. Stomach/Bowel: Stomach is within normal limits. Normal appendix. No evidence of small bowel bowel wall thickening, distention, or inflammatory changes. There is rectal wall thickening with adjacent inflammatory stranding. Vascular/Lymphatic: No abdominal aortic aneurysm. Evaluation of the abdomen pelvis for adenopathy is limited by an relative paucity of peritoneal fat and lack of IV contrast. However, there is interval increase in retroperitoneal adenopathy with the previously indexed left periaortic  lymph node now measuring 17 mm on image 2/3 previously 12 mm. Reproductive: Prostate is unremarkable. Other: Walled off fluid collections. No pneumoperitoneum. No portal venous gas. Musculoskeletal: No acute or significant osseous findings. IMPRESSION: 1. Dilated gallbladder with pericholecystic fluid but without wall thickening. Findings which are equivocal for acute cholecystitis. Suggest correlation with tenderness to palpation and right upper quadrant ultrasound. 2. Peripancreatic fluid extending into the small bowel mesentery, possibly reflecting acute pancreatitis. Correlation with serum lipase suggested. 3. Rectal wall thickening with adjacent inflammatory stranding suggestive of proctitis. 4. Interval increase in retroperitoneal adenopathy, possibly reactive in this patient with reported history of HIV. 5. Right lower lobe ground-glass nodules measuring up to 6 mm, likely infectious or inflammatory. Electronically Signed   By: Maudry Mayhew MD   On: 03/03/2021 21:55   DG Chest Portable 1 View  Result Date: 03/03/2021 CLINICAL DATA:  Shortness of breath, nausea, vomiting EXAM: PORTABLE CHEST 1 VIEW COMPARISON:  02/09/2021 FINDINGS: Heart and mediastinal contours are within normal limits. No focal opacities or effusions. No acute bony abnormality. IMPRESSION: No active disease. Electronically Signed   By: Charlett Nose M.D.   On: 03/03/2021 20:19    Procedures Procedures   Medications Ordered in ED Medications  lactated ringers infusion ( Intravenous New Bag/Given 03/03/21 2206)  meropenem (MERREM) 1 g in sodium chloride 0.9 % 100 mL IVPB (0 g Intravenous Stopped 03/03/21 2316)  metoCLOPramide (REGLAN) injection 5 mg (has no administration in time range)  diphenhydrAMINE (BENADRYL) injection 25 mg (has no administration in time range)  sodium chloride 0.9 % bolus 1,000 mL (0 mLs Intravenous Stopped 03/03/21 2303)  ondansetron (ZOFRAN) injection 4 mg (4 mg Intravenous Given 03/03/21 1957)  morphine  4 MG/ML injection 4 mg (4 mg Intravenous Given 03/03/21 2147)  acetaminophen (TYLENOL) tablet 650 mg (650 mg Oral Given 03/03/21 2221)  ondansetron (ZOFRAN) injection 4 mg (4  mg Intravenous Given 03/03/21 2242)  lactated ringers bolus 1,000 mL (1,000 mLs Intravenous New Bag/Given 03/03/21 2315)    ED Course  I have reviewed the triage vital signs and the nursing notes.  Pertinent labs & imaging results that were available during my care of the patient were reviewed by me and considered in my medical decision making (see chart for details).    MDM Rules/Calculators/A&P                           35yo male with history/presentation as above. Abdomen is tender, non-peritoneal. Complaint of NVD, poor oral intake. Cough/chills. Tachycardic on arrival, afebrile. Lungs clear b/l. Breathing comfortably on room air. Serious etiology considered.  ECG reviewed, sinus tachycardia, no STEMI. Similar to prior tracing.  Labs reviewed: Patient with leukopenia 2.2, baseline WBC  is around 4.  Tachycardic, febrile, elevated lactic acid.  Concern for intra-abdominal infection.  Cultures have been obtained.  IV fluid still in progress.  Patient history of ESBL.  Susceptible to meropenem.  This has  been ordered.  Patient was found to be in acute renal failure, hyponatremia at 126; potassium stable.  IV fluids in progress. Likely pre-renal.   CT imaging reveals possible acute cholecystitis, proctitis, pancreatitis.  Lipase stable.  Doubt acute pancreatitis.  Patient with elevated AST, T bili.  He is tender to the right upper quadrant on direct palpation.  Concern for acute cholecystitis.  IV antibiotics in progress. CT imaging also noted ground glass opacities in the lung fields lower, COVID19 testing is pending. If this is negative it would be reasonable to add MRSA coverage.   Patient with septic shock, LA 5.7; likely secondary to acute cholecystitis, proctitis, concern for ESBL.  Continue broad-spectrum  antibiotics.  IVF resus.  Recommend admission to stepdown.  Patient agreeable.  Discussed with internal medicine physician who agrees accept patient for admission.  D/w general surgery, requests RUQ U/S, they feel acute cholecystitis is less likely. Recommend GI evaluation. This was relayed to admitting service.     CRITICAL CARE Performed by: Sloan LeiterSamuel A Ronnica Dreese   Total critical care time: 45 minutes  Critical care time was exclusive of separately billable procedures and treating other patients.  Critical care was necessary to treat or prevent imminent or life-threatening deterioration.  Critical care was time spent personally by me on the following activities: development of treatment plan with patient and/or surrogate as well as nursing, discussions with consultants, evaluation of patient's response to treatment, examination of patient, obtaining history from patient or surrogate, ordering and performing treatments and interventions, ordering and review of laboratory studies, ordering and review of radiographic studies, pulse oximetry and re-evaluation of patient's condition.   Final Clinical Impression(s) / ED Diagnoses Final diagnoses:  HIV infection, unspecified symptom status (HCC)  History of ESBL E. coli infection  Acute cholecystitis  Proctitis  Acute renal failure, unspecified acute renal failure type (HCC)  Hyponatremia  Septic shock (HCC)  RUQ pain    Rx / DC Orders ED Discharge Orders     None        Sloan LeiterGray, Talma Aguillard A, DO 03/03/21 2324

## 2021-03-03 NOTE — ED Notes (Signed)
Temp 100.4. Dr. Wallace Cullens made aware .

## 2021-03-03 NOTE — ED Notes (Signed)
Lactic 5.7. doctor made aware.

## 2021-03-03 NOTE — ED Triage Notes (Signed)
Pt bib [facility] c/o SOB and N/V. Per ems, pt has been having decreased po intake and output for couple of days. Pt reported throwing up several times today. A&O *4   Ems vitals:  Bp 110/40 HR 140  Cbg 150  Wasn't able to get O2 reading.

## 2021-03-03 NOTE — Sepsis Progress Note (Signed)
Following per sepsis protocol   

## 2021-03-03 NOTE — ED Notes (Signed)
Patient transported to MRI 

## 2021-03-04 ENCOUNTER — Encounter (HOSPITAL_COMMUNITY): Payer: Self-pay | Admitting: Internal Medicine

## 2021-03-04 ENCOUNTER — Inpatient Hospital Stay (HOSPITAL_COMMUNITY): Payer: Medicaid Other

## 2021-03-04 DIAGNOSIS — Z9114 Patient's other noncompliance with medication regimen: Secondary | ICD-10-CM | POA: Diagnosis not present

## 2021-03-04 DIAGNOSIS — E872 Acidosis, unspecified: Secondary | ICD-10-CM | POA: Diagnosis present

## 2021-03-04 DIAGNOSIS — K219 Gastro-esophageal reflux disease without esophagitis: Secondary | ICD-10-CM | POA: Diagnosis present

## 2021-03-04 DIAGNOSIS — E871 Hypo-osmolality and hyponatremia: Secondary | ICD-10-CM

## 2021-03-04 DIAGNOSIS — A400 Sepsis due to streptococcus, group A: Secondary | ICD-10-CM | POA: Diagnosis not present

## 2021-03-04 DIAGNOSIS — B20 Human immunodeficiency virus [HIV] disease: Secondary | ICD-10-CM | POA: Diagnosis present

## 2021-03-04 DIAGNOSIS — I9589 Other hypotension: Secondary | ICD-10-CM | POA: Diagnosis present

## 2021-03-04 DIAGNOSIS — N179 Acute kidney failure, unspecified: Secondary | ICD-10-CM

## 2021-03-04 DIAGNOSIS — R6521 Severe sepsis with septic shock: Secondary | ICD-10-CM | POA: Diagnosis present

## 2021-03-04 DIAGNOSIS — A4151 Sepsis due to Escherichia coli [E. coli]: Secondary | ICD-10-CM | POA: Diagnosis present

## 2021-03-04 DIAGNOSIS — L89152 Pressure ulcer of sacral region, stage 2: Secondary | ICD-10-CM | POA: Diagnosis present

## 2021-03-04 DIAGNOSIS — I10 Essential (primary) hypertension: Secondary | ICD-10-CM | POA: Diagnosis present

## 2021-03-04 DIAGNOSIS — E861 Hypovolemia: Secondary | ICD-10-CM | POA: Diagnosis present

## 2021-03-04 DIAGNOSIS — Z833 Family history of diabetes mellitus: Secondary | ICD-10-CM | POA: Diagnosis not present

## 2021-03-04 DIAGNOSIS — R6520 Severe sepsis without septic shock: Secondary | ICD-10-CM

## 2021-03-04 DIAGNOSIS — B950 Streptococcus, group A, as the cause of diseases classified elsewhere: Secondary | ICD-10-CM | POA: Diagnosis not present

## 2021-03-04 DIAGNOSIS — N170 Acute kidney failure with tubular necrosis: Secondary | ICD-10-CM | POA: Diagnosis not present

## 2021-03-04 DIAGNOSIS — R54 Age-related physical debility: Secondary | ICD-10-CM | POA: Diagnosis present

## 2021-03-04 DIAGNOSIS — Z1612 Extended spectrum beta lactamase (ESBL) resistance: Secondary | ICD-10-CM | POA: Diagnosis present

## 2021-03-04 DIAGNOSIS — E162 Hypoglycemia, unspecified: Secondary | ICD-10-CM | POA: Diagnosis present

## 2021-03-04 DIAGNOSIS — Z8673 Personal history of transient ischemic attack (TIA), and cerebral infarction without residual deficits: Secondary | ICD-10-CM | POA: Diagnosis not present

## 2021-03-04 DIAGNOSIS — F17210 Nicotine dependence, cigarettes, uncomplicated: Secondary | ICD-10-CM | POA: Diagnosis present

## 2021-03-04 DIAGNOSIS — Z880 Allergy status to penicillin: Secondary | ICD-10-CM | POA: Diagnosis not present

## 2021-03-04 DIAGNOSIS — B370 Candidal stomatitis: Secondary | ICD-10-CM | POA: Diagnosis not present

## 2021-03-04 DIAGNOSIS — D6959 Other secondary thrombocytopenia: Secondary | ICD-10-CM | POA: Diagnosis present

## 2021-03-04 DIAGNOSIS — K810 Acute cholecystitis: Secondary | ICD-10-CM | POA: Diagnosis present

## 2021-03-04 DIAGNOSIS — Z8249 Family history of ischemic heart disease and other diseases of the circulatory system: Secondary | ICD-10-CM | POA: Diagnosis not present

## 2021-03-04 DIAGNOSIS — Z8619 Personal history of other infectious and parasitic diseases: Secondary | ICD-10-CM | POA: Diagnosis not present

## 2021-03-04 DIAGNOSIS — Z20822 Contact with and (suspected) exposure to covid-19: Secondary | ICD-10-CM | POA: Diagnosis present

## 2021-03-04 DIAGNOSIS — Z8042 Family history of malignant neoplasm of prostate: Secondary | ICD-10-CM | POA: Diagnosis not present

## 2021-03-04 LAB — CBC WITH DIFFERENTIAL/PLATELET
Abs Immature Granulocytes: 1.2 10*3/uL — ABNORMAL HIGH (ref 0.00–0.07)
Abs Immature Granulocytes: 0 10*3/uL (ref 0.00–0.07)
Basophils Absolute: 0 10*3/uL (ref 0.0–0.1)
Basophils Absolute: 0 10*3/uL (ref 0.0–0.1)
Basophils Relative: 1 %
Basophils Relative: 1 %
Eosinophils Absolute: 0.2 10*3/uL (ref 0.0–0.5)
Eosinophils Absolute: 0.5 10*3/uL (ref 0.0–0.5)
Eosinophils Relative: 7 %
Eosinophils Relative: 17 %
HCT: 37.9 % — ABNORMAL LOW (ref 39.0–52.0)
HCT: 36.2 % — ABNORMAL LOW (ref 39.0–52.0)
Hemoglobin: 12.8 g/dL — ABNORMAL LOW (ref 13.0–17.0)
Hemoglobin: 12.5 g/dL — ABNORMAL LOW (ref 13.0–17.0)
Immature Granulocytes: 0 %
Lymphocytes Relative: 8 %
Lymphocytes Relative: 2 %
Lymphs Abs: 0.2 10*3/uL — ABNORMAL LOW (ref 0.7–4.0)
Lymphs Abs: 0.1 10*3/uL — ABNORMAL LOW (ref 0.7–4.0)
MCH: 30.5 pg (ref 26.0–34.0)
MCH: 30.6 pg (ref 26.0–34.0)
MCHC: 33.8 g/dL (ref 30.0–36.0)
MCHC: 34.5 g/dL (ref 30.0–36.0)
MCV: 90.2 fL (ref 80.0–100.0)
MCV: 88.7 fL (ref 80.0–100.0)
Monocytes Absolute: 0.2 10*3/uL (ref 0.1–1.0)
Monocytes Absolute: 0.1 10*3/uL (ref 0.1–1.0)
Monocytes Relative: 8 %
Monocytes Relative: 5 %
Neutro Abs: 0.4 10*3/uL — CL (ref 1.7–7.7)
Neutro Abs: 2.2 10*3/uL (ref 1.7–7.7)
Neutrophils Relative %: 20 %
Neutrophils Relative %: 75 %
Platelets: 40 10*3/uL — ABNORMAL LOW (ref 150–400)
Platelets: 47 10*3/uL — ABNORMAL LOW (ref 150–400)
RBC: 4.2 MIL/uL — ABNORMAL LOW (ref 4.22–5.81)
RBC: 4.08 MIL/uL — ABNORMAL LOW (ref 4.22–5.81)
RDW: 16.5 % — ABNORMAL HIGH (ref 11.5–15.5)
RDW: 16.7 % — ABNORMAL HIGH (ref 11.5–15.5)
WBC: 2.2 10*3/uL — ABNORMAL LOW (ref 4.0–10.5)
WBC: 2.9 10*3/uL — ABNORMAL LOW (ref 4.0–10.5)
nRBC: 0 % (ref 0.0–0.2)
nRBC: 0 % (ref 0.0–0.2)

## 2021-03-04 LAB — GLUCOSE, CAPILLARY
Glucose-Capillary: 13 mg/dL — CL (ref 70–99)
Glucose-Capillary: 75 mg/dL (ref 70–99)
Glucose-Capillary: 27 mg/dL — CL (ref 70–99)
Glucose-Capillary: 70 mg/dL (ref 70–99)
Glucose-Capillary: 69 mg/dL — ABNORMAL LOW (ref 70–99)
Glucose-Capillary: 44 mg/dL — CL (ref 70–99)

## 2021-03-04 LAB — BLOOD CULTURE ID PANEL (REFLEXED) - BCID2

## 2021-03-04 LAB — COMPREHENSIVE METABOLIC PANEL
ALT: 41 U/L (ref 0–44)
ALT: 38 U/L (ref 0–44)
AST: 158 U/L — ABNORMAL HIGH (ref 15–41)
AST: 96 U/L — ABNORMAL HIGH (ref 15–41)
Albumin: 2.2 g/dL — ABNORMAL LOW (ref 3.5–5.0)
Albumin: 1.8 g/dL — ABNORMAL LOW (ref 3.5–5.0)
Alkaline Phosphatase: 63 U/L (ref 38–126)
Alkaline Phosphatase: 72 U/L (ref 38–126)
Anion gap: 16 — ABNORMAL HIGH (ref 5–15)
Anion gap: 15 (ref 5–15)
BUN: 41 mg/dL — ABNORMAL HIGH (ref 6–20)
BUN: 54 mg/dL — ABNORMAL HIGH (ref 6–20)
CO2: 15 mmol/L — ABNORMAL LOW (ref 22–32)
CO2: 19 mmol/L — ABNORMAL LOW (ref 22–32)
Calcium: 7.2 mg/dL — ABNORMAL LOW (ref 8.9–10.3)
Calcium: 6.9 mg/dL — ABNORMAL LOW (ref 8.9–10.3)
Chloride: 95 mmol/L — ABNORMAL LOW (ref 98–111)
Chloride: 93 mmol/L — ABNORMAL LOW (ref 98–111)
Creatinine, Ser: 3.7 mg/dL — ABNORMAL HIGH (ref 0.61–1.24)
Creatinine, Ser: 5.04 mg/dL — ABNORMAL HIGH (ref 0.61–1.24)
GFR, Estimated: 21 mL/min — ABNORMAL LOW (ref >60)
GFR, Estimated: 14 mL/min — ABNORMAL LOW (ref >60)
Glucose, Bld: 55 mg/dL — ABNORMAL LOW (ref 70–99)
Glucose, Bld: 73 mg/dL (ref 70–99)
Potassium: 4.2 mmol/L (ref 3.5–5.1)
Potassium: 4.2 mmol/L (ref 3.5–5.1)
Sodium: 126 mmol/L — ABNORMAL LOW (ref 135–145)
Sodium: 127 mmol/L — ABNORMAL LOW (ref 135–145)
Total Bilirubin: 4.6 mg/dL — ABNORMAL HIGH (ref 0.3–1.2)
Total Bilirubin: 3.4 mg/dL — ABNORMAL HIGH (ref 0.3–1.2)
Total Protein: 7.6 g/dL (ref 6.5–8.1)
Total Protein: 6.4 g/dL — ABNORMAL LOW (ref 6.5–8.1)

## 2021-03-04 LAB — BLOOD GAS, VENOUS
Acid-base deficit: 6 mmol/L — ABNORMAL HIGH (ref 0.0–2.0)
Bicarbonate: 19.6 mmol/L — ABNORMAL LOW (ref 20.0–28.0)
Drawn by: 5979
FIO2: 21
O2 Saturation: 19.8 %
Patient temperature: 37
pCO2, Ven: 43.7 mmHg — ABNORMAL LOW (ref 44.0–60.0)
pH, Ven: 7.275 (ref 7.250–7.430)
pO2, Ven: <31 mmHg — CL (ref 32.0–45.0)

## 2021-03-04 LAB — LACTIC ACID, PLASMA
Lactic Acid, Venous: >11 mmol/L (ref 0.5–1.9)
Lactic Acid, Venous: 5.5 mmol/L (ref 0.5–1.9)
Lactic Acid, Venous: 4.3 mmol/L (ref 0.5–1.9)
Lactic Acid, Venous: 4.1 mmol/L (ref 0.5–1.9)

## 2021-03-04 LAB — HEMOGLOBIN A1C
Hgb A1c MFr Bld: 5.9 % — ABNORMAL HIGH (ref 4.8–5.6)
Mean Plasma Glucose: 122.63 mg/dL

## 2021-03-04 LAB — APTT: aPTT: 37 seconds — ABNORMAL HIGH (ref 24–36)

## 2021-03-04 LAB — PROCALCITONIN: Procalcitonin: >150 ng/mL

## 2021-03-04 LAB — CORTISOL: Cortisol, Plasma: 51.9 ug/dL

## 2021-03-04 LAB — PROTIME-INR
INR: 1.6 — ABNORMAL HIGH (ref 0.8–1.2)
Prothrombin Time: 18.8 seconds — ABNORMAL HIGH (ref 11.4–15.2)

## 2021-03-04 LAB — C-REACTIVE PROTEIN: CRP: 31.6 mg/dL — ABNORMAL HIGH (ref <1.0)

## 2021-03-04 MED ORDER — LACTATED RINGERS IV BOLUS: 1000.0000 mL | Freq: Once | INTRAVENOUS | Status: DC

## 2021-03-04 MED ORDER — DEXTROSE 50 % IV SOLN: 25.0000 g | INTRAVENOUS | Status: AC

## 2021-03-04 MED ORDER — DEXTROSE IN LACTATED RINGERS 5 % IV SOLN: INTRAVENOUS | Status: DC

## 2021-03-04 MED ORDER — ONDANSETRON HCL 4 MG PO TABS
4.0000 mg | ORAL_TABLET | Freq: Four times a day (QID) | ORAL | Status: DC | PRN
  Administered 2021-03-06 – 2021-03-07 (×2): 4 mg via ORAL
  Filled 2021-03-04 (×2): qty 1

## 2021-03-04 MED ORDER — METRONIDAZOLE 500 MG/100ML IV SOLN
500.0000 mg | Freq: Two times a day (BID) | INTRAVENOUS | Status: DC
  Administered 2021-03-04: 500 mg via INTRAVENOUS
  Filled 2021-03-04: qty 100

## 2021-03-04 MED ORDER — SODIUM CHLORIDE 0.9 % IV SOLN
2.0000 g | INTRAVENOUS | Status: DC
  Administered 2021-03-04: 2 g via INTRAVENOUS
  Filled 2021-03-04: qty 20

## 2021-03-04 MED ORDER — LACTATED RINGERS IV SOLN: INTRAVENOUS | Status: DC

## 2021-03-04 MED ORDER — TECHNETIUM TC 99M MEBROFENIN IV KIT
7.8000 | PACK | Freq: Once | INTRAVENOUS | Status: AC | PRN
  Administered 2021-03-04: 7.8 via INTRAVENOUS

## 2021-03-04 MED ORDER — MIDODRINE HCL 5 MG PO TABS
10.0000 mg | ORAL_TABLET | Freq: Three times a day (TID) | ORAL | Status: DC
  Administered 2021-03-04 – 2021-03-07 (×10): 10 mg via ORAL
  Filled 2021-03-04 (×10): qty 2

## 2021-03-04 MED ORDER — ONDANSETRON HCL 4 MG/2ML IJ SOLN
4.0000 mg | Freq: Four times a day (QID) | INTRAMUSCULAR | Status: DC | PRN
  Administered 2021-03-04 – 2021-03-06 (×5): 4 mg via INTRAVENOUS
  Filled 2021-03-04 (×5): qty 2

## 2021-03-04 MED ORDER — ACETAMINOPHEN 650 MG RE SUPP
650.0000 mg | Freq: Four times a day (QID) | RECTAL | Status: DC | PRN
  Filled 2021-03-04: qty 1

## 2021-03-04 MED ORDER — DEXTROSE 50 % IV SOLN
INTRAVENOUS | Status: AC
  Administered 2021-03-04: 50 mL
  Filled 2021-03-04: qty 50

## 2021-03-04 MED ORDER — DEXTROSE 50 % IV SOLN
25.0000 g | INTRAVENOUS | Status: AC
  Administered 2021-03-04: 25 g via INTRAVENOUS

## 2021-03-04 MED ORDER — SODIUM CHLORIDE 0.9 % IV BOLUS
1000.0000 mL | INTRAVENOUS | Status: AC
  Administered 2021-03-04: 1000 mL via INTRAVENOUS

## 2021-03-04 MED ORDER — LINEZOLID 600 MG/300ML IV SOLN
600.0000 mg | Freq: Two times a day (BID) | INTRAVENOUS | Status: AC
  Administered 2021-03-04 – 2021-03-07 (×7): 600 mg via INTRAVENOUS
  Filled 2021-03-04 (×7): qty 300

## 2021-03-04 MED ORDER — FLUCONAZOLE 100 MG PO TABS
100.0000 mg | ORAL_TABLET | Freq: Once | ORAL | Status: AC
  Administered 2021-03-04: 100 mg via ORAL
  Filled 2021-03-04: qty 1

## 2021-03-04 MED ORDER — ACETAMINOPHEN 325 MG PO TABS: 650.0000 mg | ORAL_TABLET | Freq: Four times a day (QID) | ORAL | Status: DC | PRN

## 2021-03-04 MED ORDER — DARUN-COBIC-EMTRICIT-TENOFAF 800-150-200-10 MG PO TABS
1.0000 | ORAL_TABLET | Freq: Every day | ORAL | Status: DC
  Administered 2021-03-04 – 2021-03-11 (×8): 1 via ORAL
  Filled 2021-03-04 (×8): qty 1

## 2021-03-04 MED ORDER — ENOXAPARIN SODIUM 40 MG/0.4ML IJ SOSY
40.0000 mg | PREFILLED_SYRINGE | INTRAMUSCULAR | Status: DC
  Administered 2021-03-06 – 2021-03-07 (×2): 40 mg via SUBCUTANEOUS
  Filled 2021-03-04 (×3): qty 0.4

## 2021-03-04 MED ORDER — LACTATED RINGERS IV BOLUS
1000.0000 mL | Freq: Once | INTRAVENOUS | Status: AC
  Administered 2021-03-04: 1000 mL via INTRAVENOUS

## 2021-03-04 MED ORDER — DEXTROSE 50 % IV SOLN
INTRAVENOUS | Status: AC
  Filled 2021-03-04: qty 50

## 2021-03-04 MED ORDER — MIDODRINE HCL 5 MG PO TABS
10.0000 mg | ORAL_TABLET | ORAL | Status: AC
  Administered 2021-03-04: 10 mg via ORAL
  Filled 2021-03-04: qty 2

## 2021-03-04 MED ORDER — FLUCONAZOLE 50 MG PO TABS
50.0000 mg | ORAL_TABLET | Freq: Every day | ORAL | Status: DC
  Administered 2021-03-05: 50 mg via ORAL
  Filled 2021-03-04: qty 1

## 2021-03-04 MED ORDER — SODIUM CHLORIDE 0.9 % IV SOLN
1.0000 g | Freq: Two times a day (BID) | INTRAVENOUS | Status: DC
  Administered 2021-03-04 – 2021-03-05 (×2): 1 g via INTRAVENOUS
  Filled 2021-03-04 (×2): qty 1

## 2021-03-04 NOTE — Progress Notes (Signed)
PROGRESS NOTE    Jacob Gutierrez  ZPH:[id number] DOB: 1986-07-16 DOA: 03/03/2021 PCP: Patient, No Pcp Per (Inactive)   Brief Narrative:  35 year old male with past medical history of HIV/AIDS (CD4 < 35 09/2020), medication noncompliance, history of syphilis who presents to [facility] emergency department with complaints of nausea vomiting and back pain. Known previous infections including syphilis, shigella, coccidioidomycosis, cryptosporidium in the setting of immunodeficiency and medication noncompliance.  Of note, patient was recently hospitalized at [facility] from 7/19 until 7/25.  During that hospitalization patient was treated for sepsis secondary to pyelonephritis secondary to ESBL E. coli.  Presentation was complicated by associated acute kidney injury.  Patient was treated with 5 days of intravenous meropenem as well as intravenous fluids.  Acute kidney injury resolved, patient clinically improved.  Patient was additionally treated with fluconazole for observed thrush.  Patient was discharged home on 7/25.   Approximately 2 days prior to admission patient describes worsening back pain which is nonradiating with associated nausea and episodes of vomiting with extremely poor p.o. intake generalized weakness fevers and chills.  Patient met sepsis criteria at intake given fever tachycardia tachypnea leukopenia with presumed source of infection to the recurrence of ESBL E. coli pyelonephritis due to medication noncompliance.  Assessment & Plan:   Principal Problem:   Sepsis due to Escherichia coli (E. coli) (HCC) Active Problems:   Human immunodeficiency virus (HIV) disease (HCC)   Hyponatremia   Acute kidney injury ([place])   Lactic acidosis   GERD (gastroesophageal reflux disease)   Sepsis due to ESBL Escherichia coli (E. coli) (HCC) - History of recent ESBL E. coli pyelonephritis, unclear at this point if this is a systemic infection or simply a recurrence of his  previous pyelonephritis in the setting of medication noncompliance -CT unremarkable for overt recurrence making systemic infection much more worrisome and likely -Patient restarted on IV meropenem based on previous sensitivities, repeat cultures pending -Continue IV fluids in the setting of poor p.o. intake -Once cultures result will discuss with infectious disease for past clinical course; patient would be high risk for discharge given his profound noncompliance, may require lengthy hospital stay to ensure appropriate IV antibiotic course versus discharge to SNF -ED physician was concerned for possible cholecystitis as such surgery was consulted appreciate follow-up recommendations, ultrasound generally unremarkable, HIDA scan pending  AKI, prerenal in the setting of poor p.o. intake -Continue IV fluids, currently on LR, will likely transition to D5 LR given hypoglycemia as below -Continue to follow repeat labs  Profound lactic acidosis -Likely secondary to sepsis as above, continue IV fluids and follow-up for sources of infection -Downtrending appropriately   AIDS/Human immunodeficiency virus (HIV) disease ([place]) Recurrent profound medication noncompliance -Continue home HAART regimen Lab Results  Component Value Date   HIV1RNAQUANT 449,000 (H) 08/25/2020   Lab Results  Component Value Date   CD4TCELL 3 (L) 02/10/2021   CD4TABS <35 (L) 02/10/2021  -We will likely consult ID in the morning given above as well as to assist with concurrent HIV treatment  Hypoglycemia, profound  -Patient does not carry diabetes diagnosis per chart review -A1c pending -Transition fluids to D5 with LR, Accu-Chek every 6 hours; encouraged p.o. intake but patient continues to refuse -Hypoglycemia likely in the setting of profound malnourishment and poor p.o. intake  Hypovolemic hyponatremia -In the setting of above poor p.o. intake -Continue IV fluids    Substance abuse  - UDS previously positive for  amphetamines and THC  - Counseled toward  cessation    DVT prophylaxis: Lovenox Code Status: Full Family Communication: None available  Status is: Inpatient  Dispo: The patient is from: Home              Anticipated d/c is to: To be determined              Anticipated d/c date is: >72h              Patient currently not medically stable for discharge  Consultants:  General surgery  Procedures:  None planned  Antimicrobials:  Meropenem  Subjective: No acute issues or events this morning, patient had episode of hypoglycemia late this afternoon continues to complain of fevers chills and generally feeling unwell without any specific complaints this afternoon denies nausea vomiting diarrhea constipation headache or cough.  Objective: Vitals:   03/04/21 0630 03/04/21 0700 03/04/21 0730 03/04/21 0830  BP: 107/68 104/81 103/74 100/73  Pulse: (!) 134 (!) 127 (!) 129   Resp: (!) 28 (!) 27 (!) 26 (!) 30  Temp:      TempSrc:      SpO2: 98% 97% 98%   Weight:        Intake/Output Summary (Last 24 hours) at 03/04/2021 0855 Last data filed at 03/04/2021 0504 Gross per 24 hour  Intake 2000 ml  Output --  Net 2000 ml   Filed Weights   03/03/21 2050  Weight: 79.4 kg    Examination:  General: Resting in bed, mildly tremulous but no acute distress HEENT:  Normocephalic atraumatic.  Sclerae nonicteric, noninjected.  Extraocular movements intact bilaterally. Neck:  Without mass or deformity.  Trachea is midline. Lungs:  Clear to auscultate bilaterally without rhonchi, wheeze, or rales. Heart:  Regular rate and rhythm.  Without murmurs, rubs, or gallops. Abdomen:  Soft, nontender, nondistended.  Without guarding or rebound. Extremities: Without cyanosis, clubbing, edema, or obvious deformity. Vascular:  Dorsalis pedis and posterior tibial pulses palpable bilaterally. Skin:  Warm and dry, no erythema, no ulcerations.   Data Reviewed: I have personally reviewed following labs and  imaging studies  CBC: Recent Labs  Lab 03/03/21 1901  WBC 2.2*  NEUTROABS 1.7  HGB 14.4  HCT 43.6  MCV 92.6  PLT PLATELET CLUMPS NOTED ON SMEAR, UNABLE TO ESTIMATE   Basic Metabolic Panel: Recent Labs  Lab 03/03/21 1901 03/04/21 0553  NA 126* 126*  K 3.5 4.2  CL 93* 95*  CO2 14* 15*  GLUCOSE 65* 55*  BUN 33* 41*  CREATININE 3.18* 3.70*  CALCIUM 8.1* 7.2*   GFR: Estimated Creatinine Clearance: 30.7 mL/min (A) (by C-G formula based on SCr of 3.7 mg/dL (H)). Liver Function Tests: Recent Labs  Lab 03/03/21 1901 03/04/21 0553  AST 72* 158*  ALT 26 41  ALKPHOS 71 63  BILITOT 2.9* 4.6*  PROT 9.6* 7.6  ALBUMIN 2.9* 2.2*   Recent Labs  Lab 03/03/21 1901  LIPASE 27   No results for input(s): AMMONIA in the last 168 hours. Coagulation Profile: Recent Labs  Lab 03/04/21 0553  INR 1.6*   Cardiac Enzymes: No results for input(s): CKTOTAL, CKMB, CKMBINDEX, TROPONINI in the last 168 hours. BNP (last 3 results) No results for input(s): PROBNP in the last 8760 hours. HbA1C: No results for input(s): HGBA1C in the last 72 hours. CBG: No results for input(s): GLUCAP in the last 168 hours. Lipid Profile: No results for input(s): CHOL, HDL, LDLCALC, TRIG, CHOLHDL, LDLDIRECT in the last 72 hours. Thyroid Function Tests: No results for input(s): TSH,  T4TOTAL, FREET4, T3FREE, THYROIDAB in the last 72 hours. Anemia Panel: No results for input(s): VITAMINB12, FOLATE, FERRITIN, TIBC, IRON, RETICCTPCT in the last 72 hours. Sepsis Labs: Recent Labs  Lab 03/03/21 2212 03/04/21 0437 03/04/21 0553  PROCALCITON  --   --  >150.00  LATICACIDVEN 5.7* >11.0* 5.5*    Recent Results (from the past 240 hour(s))  Blood Culture (routine x 2)     Status: None (Preliminary result)   Collection Time: 03/03/21  8:36 PM   Specimen: Site Not Specified; Blood  Result Value Ref Range Status   Specimen Description SITE NOT SPECIFIED  Final   Special Requests   Final    BOTTLES DRAWN  AEROBIC ONLY Blood Culture results may not be optimal due to an inadequate volume of blood received in culture bottles   Culture  Setup Time   Final    GRAM POSITIVE COCCI IN CHAINS AEROBIC BOTTLE ONLY Organism ID to follow Performed at [facility], [place] [address] [place], [place] [address]    Culture GRAM POSITIVE COCCI IN CHAINS  Final   Report Status PENDING  Incomplete  Resp Panel by RT-PCR (Flu A&B, Covid) Nasopharyngeal Swab     Status: None   Collection Time: 03/03/21 10:49 PM   Specimen: Nasopharyngeal Swab; Nasopharyngeal(NP) swabs in vial transport medium  Result Value Ref Range Status   SARS Coronavirus 2 by RT PCR NEGATIVE NEGATIVE Final    Comment: (NOTE) SARS-CoV-2 target nucleic acids are NOT DETECTED.  The SARS-CoV-2 RNA is generally detectable in upper respiratory specimens during the acute phase of infection. The lowest concentration of SARS-CoV-2 viral copies this assay can detect is 138 copies/mL. A negative result does not preclude SARS-Cov-2 infection and should not be used as the sole basis for treatment or other patient management decisions. A negative result may occur with  improper specimen collection/handling, submission of specimen other than nasopharyngeal swab, presence of viral mutation(s) within the areas targeted by this assay, and inadequate number of viral copies(<138 copies/mL). A negative result must be combined with clinical observations, patient history, and epidemiological information. The expected result is Negative.  Fact Sheet for Patients:  EntrepreneurPulse.com.au  Fact Sheet for Healthcare Providers:  IncredibleEmployment.be  This test is no t yet approved or cleared by the [place] FDA and  has been authorized for detection and/or diagnosis of SARS-CoV-2 by FDA under an Emergency Use Authorization (EUA). This EUA will remain  in effect (meaning this test can be used) for the duration  of the COVID-19 declaration under Section 564(b)(1) of the Act, 21 U.S.C.section 360bbb-3(b)(1), unless the authorization is terminated  or revoked sooner.       Influenza A by PCR NEGATIVE NEGATIVE Final   Influenza B by PCR NEGATIVE NEGATIVE Final    Comment: (NOTE) The Xpert Xpress SARS-CoV-2/FLU/RSV plus assay is intended as an aid in the diagnosis of influenza from Nasopharyngeal swab specimens and should not be used as a sole basis for treatment. Nasal washings and aspirates are unacceptable for Xpert Xpress SARS-CoV-2/FLU/RSV testing.  Fact Sheet for Patients: EntrepreneurPulse.com.au  Fact Sheet for Healthcare Providers: IncredibleEmployment.be  This test is not yet approved or cleared by the [place] FDA and has been authorized for detection and/or diagnosis of SARS-CoV-2 by FDA under an Emergency Use Authorization (EUA). This EUA will remain in effect (meaning this test can be used) for the duration of the COVID-19 declaration under Section 564(b)(1) of the Act, 21 U.S.C. section 360bbb-3(b)(1), unless the authorization  is terminated or revoked.  Performed at [facility], [place] [address], [place], [place] [address]          Radiology Studies: CT ABDOMEN PELVIS WO CONTRAST  Result Date: 03/03/2021 CLINICAL DATA:  Acute abdominal pain. Possible sepsis. History of HIV. EXAM: CT ABDOMEN AND PELVIS WITHOUT CONTRAST TECHNIQUE: Multidetector CT imaging of the abdomen and pelvis was performed following the standard protocol without IV contrast. COMPARISON:  February 11, 2021 FINDINGS: Lower chest: Right lower lobe ground-glass nodules measuring up to 6 mm, likely infectious or inflammatory. Hepatobiliary: Unremarkable noncontrast appearance of the hepatic parenchyma. Mild periportal edema. The gallbladder is dilated with an wall thickening pericholecystic fluid. No biliary ductal dilation. Pancreas: Peripancreatic fluid with  unremarkable noncontrast appearance the pancreatic parenchyma. No pancreatic ductal dilation. Spleen: Within normal limits. Adrenals/Urinary Tract: Adrenal glands are unremarkable. Kidneys are normal, without renal calculi, contour deforming lesion, or hydronephrosis. Bladder is unremarkable degree of distension. Stomach/Bowel: Stomach is within normal limits. Normal appendix. No evidence of small bowel bowel wall thickening, distention, or inflammatory changes. There is rectal wall thickening with adjacent inflammatory stranding. Vascular/Lymphatic: No abdominal aortic aneurysm. Evaluation of the abdomen pelvis for adenopathy is limited by an relative paucity of peritoneal fat and lack of IV contrast. However, there is interval increase in retroperitoneal adenopathy with the previously indexed left periaortic lymph node now measuring 17 mm on image 2/3 previously 12 mm. Reproductive: Prostate is unremarkable. Other: Walled off fluid collections. No pneumoperitoneum. No portal venous gas. Musculoskeletal: No acute or significant osseous findings. IMPRESSION: 1. Dilated gallbladder with pericholecystic fluid but without wall thickening. Findings which are equivocal for acute cholecystitis. Suggest correlation with tenderness to palpation and right upper quadrant ultrasound. 2. Peripancreatic fluid extending into the small bowel mesentery, possibly reflecting acute pancreatitis. Correlation with serum lipase suggested. 3. Rectal wall thickening with adjacent inflammatory stranding suggestive of proctitis. 4. Interval increase in retroperitoneal adenopathy, possibly reactive in this patient with reported history of HIV. 5. Right lower lobe ground-glass nodules measuring up to 6 mm, likely infectious or inflammatory. Electronically Signed   By: Dahlia Bailiff MD   On: 03/03/2021 21:55   DG Chest Portable 1 View  Result Date: 03/03/2021 CLINICAL DATA:  Shortness of breath, nausea, vomiting EXAM: PORTABLE CHEST 1 VIEW  COMPARISON:  02/09/2021 FINDINGS: Heart and mediastinal contours are within normal limits. No focal opacities or effusions. No acute bony abnormality. IMPRESSION: No active disease. Electronically Signed   By: Rolm Baptise M.D.   On: 03/03/2021 20:19   US Abdomen Limited RUQ (LIVER/GB)  Result Date: 03/03/2021 CLINICAL DATA:  Right upper quadrant pain EXAM: ULTRASOUND ABDOMEN LIMITED RIGHT UPPER QUADRANT COMPARISON:  CT 03/03/2021 FINDINGS: Gallbladder: Borderline gallbladder wall distension. No significant gallbladder wall thickening. No visible gallstones. Sonographic Percell Miller sign is reportedly negative. Common bile duct: Diameter: 3.9 mm, nondilated Liver: Diffusely diminished hepatic echogenicity. No concerning focal lesion. Smooth surface contour. Portal vein is patent on color Doppler imaging with normal direction of blood flow towards the liver. Other: Unable to obtain decubitus imaging due to patient's status. IMPRESSION: Distended gallbladder without significant wall thickening or sonographic Murphy sign. Felt less likely to reflect an acute cholecystitis though if there is persisting concern, HIDA could be obtained. Diffusely decreased hepatic echogenicity. Nonspecific though can be seen with intrinsic liver disease. Recommend correlation with serology and patient history. Electronically Signed   By: Lovena Le M.D.   On: 03/03/2021 23:54    Scheduled Meds:  Darunavir-Cobicisctat-Emtricitabine-Tenofovir Alafenamide  1 tablet Oral Q breakfast   enoxaparin (LOVENOX) injection  40 mg Subcutaneous Q24H   midodrine  10 mg Oral TID WC   Continuous Infusions:  lactated ringers 125 mL/hr at 03/04/21 0504   meropenem (MERREM) IV Stopped (03/03/21 2316)     LOS: 0 days   Time spent: 94mn  Dailey Alberson C Emiliya Chretien, DO [facility]  If 7PM-7AM, please contact night-coverage [email or web address]  03/04/2021, 8:55 AM

## 2021-03-04 NOTE — ED Notes (Signed)
Pt offered saltine crackers to determine PO tolerance. MD aware, to place appropriate diet order if pt tolerates

## 2021-03-04 NOTE — ED Notes (Signed)
Patient transported to nuclear medicine 

## 2021-03-04 NOTE — Significant Event (Signed)
Rapid Response Event Note   Reason for Call :  Patient seen during PM rounds.  Initial Focused Assessment:  Patient alert and oriented but lethargic and nauseous. VS: HR 143, BP 79/30, RR 22, Sats 100% on RA.   IV pump with LR bolus beeping occluded. LUE IV infiltrated. Switched LR bolus to L Hand PIV. Attempted to find additional IV access and was unsuccessful. IVT consulted STAT.   BP up to 90 SBP after interventions.   Interventions:  CBG: 27- 1 amp D50 given IV. IV Team consulted STAT and called regarding urgency.   Plan of Care:  -Finish LR bolus and monitor response -Recheck CBG per protocols -Maintain 2 PIVs -Called RR if further assistance is needed.   Event Summary:  MD Notified: N/A Arrival Time: 2010 End Time: 2036  Ronne Binning, RN

## 2021-03-04 NOTE — H&P (Signed)
History and Physical    Jacob Gutierrez EHM:[id number] DOB: [birth year] DOA: 03/03/2021  PCP: Patient, No Pcp Per (Inactive)  Patient coming from: Home via EMS   Chief Complaint: Vomiting, Back Pain    HPI:    35 year old male with past medical history of HIV/AIDS (CD4 < 35 09/2020), medication noncompliance, history of syphilis who presents to [facility] emergency department with complaints of nausea vomiting and back pain.  Of note, patient was recently hospitalized at [facility] from 7/19 until 7/25.  During that hospitalization patient was treated for sepsis secondary to pyelonephritis secondary to ESBL E. coli.  Presentation was complicated by associated acute kidney injury.  Patient was treated with 5 days of intravenous meropenem as well as intravenous fluids.  Acute kidney injury resolved, patient clinically improved.  Patient was additionally treated with fluconazole for observed thrush.  Patient was discharged home on 7/25.  Patient explains that he has felt at or near his baseline since discharge up until approximately 2 days ago.  At that point patient began to experience back pain.  Patient describes the back pain as moderate to severe in intensity, sharp in quality and located in the low back.  Pain is nonradiating.  Pain is been associated with severe nausea with frequent bouts of vomiting.  Patient additionally has had difficulty tolerating any oral intake over the span of time.  Patient is also been experiencing progressively worsening generalized weakness and bouts of diaphoresis.  Patient denies sick contacts recent travel or contact with confirmed COVID-19 infection.  Patient once again endorses being noncompliant with his antiretroviral therapy.  Due to patient's progressively worsening nausea vomiting and generalized weakness EMS was contacted who promptly came to evaluate the patient and brought him into [facility] emergency department for  evaluation.  Upon evaluation in the emergency department, patient was found to exhibit multiple SIRS criteria including fever of 100.4 F, severe tachycardia, tachypnea and leukopenia.  Patient was provided with 2 L of intravenous isotonic fluid in addition to initiation of intravenous meropenem.  Patient was found to have markedly elevated lactic acid level of 5.7.  Patient was also found to have recurrent acute kidney injury of 3.18.  The hospitalist group was then called to assess the patient for admission to the hospital.  Review of Systems:   Review of Systems  Constitutional:  Positive for fever.  Gastrointestinal:  Positive for vomiting.  Musculoskeletal:  Positive for back pain.  Neurological:  Positive for weakness.  All other systems reviewed and are negative.  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 [facility])    H/O coccidioidomycosis    pulmonary    HIV disease (HCC)    Past history of allergy to penicillin-type antibiotic 07-03-2012   desensitization   PNA (pneumonia)    Shigella gastroenteritis    Syphilis    history /treated     Past Surgical History:  Procedure Laterality Date   BIOPSY  10/16/2020   Procedure: BIOPSY;  Surgeon: Lemar Lofty., MD;  Location: [facility] ENDOSCOPY;  Service: Gastroenterology;;   BIOPSY  10/23/2020   Procedure: BIOPSY;  Surgeon: Lynann Bologna, MD;  Location: [facility] ENDOSCOPY;  Service: Endoscopy;;   COLONOSCOPY WITH PROPOFOL N/A 10/23/2020   Procedure: COLONOSCOPY WITH PROPOFOL;  Surgeon: Lynann Bologna, MD;  Location: [facility] ENDOSCOPY;  Service: Endoscopy;  Laterality: N/A;   ESOPHAGOGASTRODUODENOSCOPY (EGD) WITH PROPOFOL N/A 10/16/2020   Procedure: ESOPHAGOGASTRODUODENOSCOPY (EGD) WITH PROPOFOL;  Surgeon: Meridee ScoreMansouraty, Netty StarringGabriel Jr., MD;  [facility];  Service:  Gastroenterology;  Laterality: N/A;   TRANSURETHRAL RESECTION OF PROSTATE N/A 12/09/2017   Procedure: TRANSURETHRAL RESECTION OF THE PROSTATE (TURP);  Surgeon: Crist FatHerrick, Benjamin W, MD;  Location: [facility] ORS;  Service: Urology;  Laterality: N/A;     reports that he has been smoking cigarettes. He has been smoking an average of .3 packs per day. He has never used smokeless tobacco. He reports that he does not currently use alcohol after a past usage of about 1.0 standard drink per week. He reports that he does not currently use drugs after having used the following drugs: Marijuana. Frequency: 1.00 time per week.  Allergies  Allergen Reactions   Vancomycin Anaphylaxis, Itching, Swelling and Other (See Comments)    Angioedema and "everything swells"   Amoxicillin Other (See Comments)    From childhood: "I had a reaction when i was little." (??)    Family History  Problem Relation Age of Onset   Hypertension Mother    Lupus Maternal Grandmother    Diabetes Maternal Grandmother    Cancer Paternal Grandfather        unknown    Prostate cancer Father    Diabetes Father    Diabetes Maternal Grandfather    Colon cancer Neg Hx    Stomach cancer Neg Hx    Esophageal cancer Neg Hx    Pancreatic cancer Neg Hx      Prior to Admission medications   Medication Sig Start Date End Date Taking? Authorizing Provider  Darunavir-Cobicisctat-Emtricitabine-Tenofovir Alafenamide (SYMTUZA) 800-150-200-10 MG TABS Take 1 tablet by mouth daily with breakfast. 10/23/20  Yes Ghimire, Werner LeanShanker M, MD  fluconazole (DIFLUCAN) 200 MG tablet Take 1 tablet (200 mg total) by mouth daily. 02/15/21  Yes Amin, Ankit Chirag, MD  midodrine (PROAMATINE) 10 MG tablet Take 1 tablet (10 mg total) by mouth 3 (three) times daily with meals. 02/15/21  Yes Amin, Loura HaltAnkit Chirag, MD  sulfamethoxazole-trimethoprim (BACTRIM DS) 800-160 MG tablet Take 1 tablet by mouth 3 (three) times a week. Patient taking differently: Take 1 tablet by mouth  daily. 02/15/21  Yes Amin, Loura HaltAnkit Chirag, MD  Darunavir-Cobicisctat-Emtricitabine-Tenofovir Alafenamide (SYMTUZA) 800-150-200-10 MG TABS TAKE 1 TABLET BY MOUTH DAILY WITH BREAKFAST. Patient not taking: No sig reported 10/23/20 10/23/21  Maretta BeesGhimire, Shanker M, MD  ferrous sulfate 325 (65 FE) MG tablet TAKE 1 TABLET (325 MG TOTAL) BY MOUTH DAILY AFTER BREAKFAST. Patient not taking: No sig reported 10/23/20 10/23/21  Ghimire, Werner LeanShanker M, MD  lipase/protease/amylase (CREON) 36000 UNITS CPEP capsule TAKE 2 CAPSULES (72,000 UNITS) BY MOUTH WITH EVERY MEAL, TAKE 1 CAPSULE (36,000 UNITS) BY MOUTH WITH EVERY SNACK. Patient not taking: No sig reported 09/25/20 09/25/21  Unk LightningLemmon, Jennifer Lynne, PA  loperamide (IMODIUM A-D) 2 MG tablet Take 1 tablet (2 mg total) by mouth every 8 (eight) hours. Patient not taking: No sig reported 09/18/20   Unk LightningLemmon, Jennifer Lynne, PA  pantoprazole (PROTONIX) 40 MG tablet TAKE 1 TABLET (40 MG TOTAL) BY MOUTH 2 (TWO) TIMES DAILY. Patient not taking: No sig reported 10/23/20 10/23/21  Ghimire, Werner LeanShanker M, MD  potassium chloride (KLOR-CON) 10 MEQ tablet TAKE 2 TABLETS BY MOUTH DAILY. Patient not taking: No sig reported 10/14/20 10/14/21  Anders SimmondsMcClung, Angela M, PA-C    Physical Exam: Vitals:   03/04/21 0630 03/04/21 0700 03/04/21 0730 03/04/21 0830  BP: 107/68 104/81 103/74 100/73  Pulse: (!) 134 (!) 127 (!) 129   Resp: (!) 28 (!) 27 (!) 26 (!) 30  Temp:      TempSrc:      SpO2: 98% 97% 98%   Weight:        Constitutional: Lethargic arousable, oriented x3, no associated distress.   Skin: no rashes, no lesions, poor skin turgor noted. Eyes: Pupils are equally reactive to light.  No evidence of scleral icterus or conjunctival pallor.  ENMT: Dry mucous membranes noted.  Posterior pharynx clear of any exudate or lesions.   Neck: normal, supple, no masses, no thyromegaly.  No evidence of jugular venous distension.   Respiratory: clear to auscultation bilaterally, no wheezing, no crackles. Normal  respiratory effort. No accessory muscle use.  Cardiovascular: Tachycardic rate with regular rhythm, no murmurs / rubs / gallops. No extremity edema. 2+ pedal pulses. No carotid bruits.  Chest:   Nontender without crepitus or deformity.   Back:   Midline lower back tenderness without evidence of crepitus or deformity.   Abdomen: Abdomen is soft with mild lower abdominal tenderness.  No evidence of intra-abdominal masses.  Positive bowel sounds noted in all quadrants.   Musculoskeletal: No joint deformity upper and lower extremities. Good ROM, no contractures. Normal muscle tone.  Neurologic: CN 2-12 grossly intact. Sensation intact.  Patient moving all 4 extremities spontaneously.  Patient is following all commands.  Patient is responsive to verbal stimuli.   Psychiatric: Patient exhibits depressed mood with flat affect.  Patient seems to possess insight as to their current situation.     Labs on Admission: I have personally reviewed following labs and imaging studies -   CBC: Recent Labs  Lab 03/03/21 1901  WBC 2.2*  NEUTROABS 1.7  HGB 14.4  HCT 43.6  MCV 92.6  PLT PLATELET CLUMPS NOTED ON SMEAR, UNABLE TO ESTIMATE   Basic Metabolic Panel: Recent Labs  Lab 03/03/21 1901 03/04/21 0553  NA 126* 126*  K 3.5 4.2  CL 93* 95*  CO2 14* 15*  GLUCOSE 65* 55*  BUN 33* 41*  CREATININE 3.18* 3.70*  CALCIUM 8.1* 7.2*   GFR: Estimated Creatinine Clearance: 30.7 mL/min (A) (by C-G formula based on SCr of 3.7 mg/dL (H)). Liver Function Tests: Recent Labs  Lab 03/03/21 1901 03/04/21 0553  AST 72* 158*  ALT 26 41  ALKPHOS 71 63  BILITOT 2.9* 4.6*  PROT 9.6* 7.6  ALBUMIN 2.9* 2.2*   Recent Labs  Lab 03/03/21 1901  LIPASE 27   No results for input(s): AMMONIA in the last 168 hours. Coagulation Profile: Recent Labs  Lab 03/04/21 0553  INR 1.6*   Cardiac Enzymes: No results for input(s): CKTOTAL, CKMB, CKMBINDEX, TROPONINI in the last 168 hours. BNP (last 3 results) No  results for input(s): PROBNP in the last 8760 hours. HbA1C: No results for input(s): HGBA1C in the last 72 hours. CBG: No results for input(s): GLUCAP in the last 168 hours. Lipid Profile: No results for input(s): CHOL, HDL, LDLCALC, TRIG, CHOLHDL, LDLDIRECT in the last 72 hours. Thyroid Function Tests: No results for input(s): TSH, T4TOTAL, FREET4, T3FREE, THYROIDAB in the last 72 hours. Anemia Panel: No results for input(s): VITAMINB12, FOLATE, FERRITIN, TIBC, IRON, RETICCTPCT in the last 72 hours. Urine analysis:    Component Value Date/Time   COLORURINE YELLOW 02/11/2021 1746   APPEARANCEUR HAZY (A) 02/11/2021 1746   APPEARANCEUR Hazy 07/27/2013 1742   LABSPEC 1.013 02/11/2021 1746   LABSPEC 1.026 07/27/2013 1742   PHURINE 6.0 02/11/2021  1746   GLUCOSEU NEGATIVE 02/11/2021 1746   GLUCOSEU Negative 07/27/2013 1742   HGBUR SMALL (A) 02/11/2021 1746   BILIRUBINUR NEGATIVE 02/11/2021 1746   BILIRUBINUR Negative 07/27/2013 1742   KETONESUR NEGATIVE 02/11/2021 1746   PROTEINUR 30 (A) 02/11/2021 1746   UROBILINOGEN 1 12/17/2013 1118   NITRITE NEGATIVE 02/11/2021 1746   LEUKOCYTESUR SMALL (A) 02/11/2021 1746   LEUKOCYTESUR Negative 07/27/2013 1742    Radiological Exams on Admission - Personally Reviewed: CT ABDOMEN PELVIS WO CONTRAST  Result Date: 03/03/2021 CLINICAL DATA:  Acute abdominal pain. Possible sepsis. History of HIV. EXAM: CT ABDOMEN AND PELVIS WITHOUT CONTRAST TECHNIQUE: Multidetector CT imaging of the abdomen and pelvis was performed following the standard protocol without IV contrast. COMPARISON:  February 11, 2021 FINDINGS: Lower chest: Right lower lobe ground-glass nodules measuring up to 6 mm, likely infectious or inflammatory. Hepatobiliary: Unremarkable noncontrast appearance of the hepatic parenchyma. Mild periportal edema. The gallbladder is dilated with an wall thickening pericholecystic fluid. No biliary ductal dilation. Pancreas: Peripancreatic fluid with  unremarkable noncontrast appearance the pancreatic parenchyma. No pancreatic ductal dilation. Spleen: Within normal limits. Adrenals/Urinary Tract: Adrenal glands are unremarkable. Kidneys are normal, without renal calculi, contour deforming lesion, or hydronephrosis. Bladder is unremarkable degree of distension. Stomach/Bowel: Stomach is within normal limits. Normal appendix. No evidence of small bowel bowel wall thickening, distention, or inflammatory changes. There is rectal wall thickening with adjacent inflammatory stranding. Vascular/Lymphatic: No abdominal aortic aneurysm. Evaluation of the abdomen pelvis for adenopathy is limited by an relative paucity of peritoneal fat and lack of IV contrast. However, there is interval increase in retroperitoneal adenopathy with the previously indexed left periaortic lymph node now measuring 17 mm on image 2/3 previously 12 mm. Reproductive: Prostate is unremarkable. Other: Walled off fluid collections. No pneumoperitoneum. No portal venous gas. Musculoskeletal: No acute or significant osseous findings. IMPRESSION: 1. Dilated gallbladder with pericholecystic fluid but without wall thickening. Findings which are equivocal for acute cholecystitis. Suggest correlation with tenderness to palpation and right upper quadrant ultrasound. 2. Peripancreatic fluid extending into the small bowel mesentery, possibly reflecting acute pancreatitis. Correlation with serum lipase suggested. 3. Rectal wall thickening with adjacent inflammatory stranding suggestive of proctitis. 4. Interval increase in retroperitoneal adenopathy, possibly reactive in this patient with reported history of HIV. 5. Right lower lobe ground-glass nodules measuring up to 6 mm, likely infectious or inflammatory. Electronically Signed   By: Maudry Mayhew MD   On: 03/03/2021 21:55   DG Chest Portable 1 View  Result Date: 03/03/2021 CLINICAL DATA:  Shortness of breath, nausea, vomiting EXAM: PORTABLE CHEST 1 VIEW  COMPARISON:  02/09/2021 FINDINGS: Heart and mediastinal contours are within normal limits. No focal opacities or effusions. No acute bony abnormality. IMPRESSION: No active disease. Electronically Signed   By: Charlett Nose M.D.   On: 03/03/2021 20:19   US Abdomen Limited RUQ (LIVER/GB)  Result Date: 03/03/2021 CLINICAL DATA:  Right upper quadrant pain EXAM: ULTRASOUND ABDOMEN LIMITED RIGHT UPPER QUADRANT COMPARISON:  CT 03/03/2021 FINDINGS: Gallbladder: Borderline gallbladder wall distension. No significant gallbladder wall thickening. No visible gallstones. Sonographic Eulah Pont sign is reportedly negative. Common bile duct: Diameter: 3.9 mm, nondilated Liver: Diffusely diminished hepatic echogenicity. No concerning focal lesion. Smooth surface contour. Portal vein is patent on color Doppler imaging with normal direction of blood flow towards the liver. Other: Unable to obtain decubitus imaging due to patient's status. IMPRESSION: Distended gallbladder without significant wall thickening or sonographic Murphy sign. Felt less likely to reflect an acute cholecystitis though if  there is persisting concern, HIDA could be obtained. Diffusely decreased hepatic echogenicity. Nonspecific though can be seen with intrinsic liver disease. Recommend correlation with serology and patient history. Electronically Signed   By: Kreg Shropshire M.D.   On: 03/03/2021 23:54    EKG: Personally reviewed.  Rhythm is sinus tachycardia with heart rate of 128BPM.  No dynamic ST segment changes appreciated.  Assessment/Plan Principal Problem:   Sepsis due to ESBL Escherichia coli (E. coli) (HCC)  Patient presenting slightly over 2 weeks after discharge after being treated for ESBL E. coli pyelonephritis Patient exhibiting multiple SIRS criteria and evidence of organ dysfunction including acute kidney injury and lactic acidosis all concerning for developing sepsis No definitive evidence of recurrent pyelonephritis on CT.  Urinalysis  still pending. Patient is therefore been restarted on intravenous meropenem based on recent sensitivity profile. Patient is in hydrated with a minimum of 30 cc/kg of isotonic fluids and will continue to be hydrated aggressively based on lactic acid levels and hemodynamic stability. Other potential sources of infection include possible cholecystitis.  ER provider has consulted Dr. Cliffton Asters with general surgery, their input is appreciated.  Additionally obtaining right upper quadrant ultrasound per their recommendation and will additionally proceed with HIDA scan if right upper ultrasound is not definitive. 1 other potential source of infection would be possible proctitis.  Patient has been complaining of some loose stool over the past several days. Blood cultures have been obtained Close clinical monitoring, admitting to progressive unit. Patient may benefit from infectious disease consultation based on clinical course  Active Problems:   Acute kidney injury (HCC)  Acute kidney injury, recurrent in the setting of severe volume depletion and sepsis Hydrating patient aggressively with intravenous isotonic fluid Strict input and output monitor Monitor renal function electrolytes with cell chemistries Avoiding nephrotoxic agents if at all possible    Lactic acidosis  Severe lactic acidosis of 5.7 secondary to volume depletion and underlying infection Treating patient with broad-spectrum intravenous antibiotic therapy as well as aggressive intravenous volume resuscitation Performing serial lactic acid levels to ensure downtrending and resolution    Human immunodeficiency virus (HIV) disease (HCC)  Resuming home regimen of antiretroviral therapy Patient reports intermittent compliance    Hyponatremia  Notable hypovolemic hyponatremia Hydrating patient with intravenous isotonic fluids Monitoring sodium levels with serial chemistries    Code Status:  Full code Family Communication: deferred    Status is: Inpatient  Remains inpatient appropriate because:Ongoing diagnostic testing needed not appropriate for outpatient work up, IV treatments appropriate due to intensity of illness or inability to take PO, and Inpatient level of care appropriate due to severity of illness  Dispo: The patient is from: Home              Anticipated d/c is to: Home              Patient currently is not medically stable to d/c.   Difficult to place patient No        Marinda Elk MD [facility] Pager [phone]  If 7PM-7AM, please contact night-coverage [email or web address] Use universal [place] password for that web site. If you do not have the password, please call the hospital operator.  03/04/2021, 9:45 AM

## 2021-03-04 NOTE — Progress Notes (Signed)
PHARMACY - PHYSICIAN COMMUNICATION CRITICAL VALUE ALERT - BLOOD CULTURE IDENTIFICATION (BCID)  Jacob Gutierrez is an 35 y.o. male who presented to [facility] on 03/03/2021 with a chief complaint of Nausea, Vomiting, and Diarrhea. Reporting b/l flank pain.   Assessment:   Bcx: GPC in Chains 1/3 BCID identified Streptococcus Pyogenes  Name of physician (or Provider) Contacted:  Carma Leaven MD  Current antibiotics:  Meropenem 1g IV q12h  Changes to prescribed antibiotics recommended:  Stop Meropenem 1g IV q12h Start Ceftriaxone 2g IV q24h Start Metronidazole 500 mg IV q12h  Results for orders placed or performed during the hospital encounter of 03/03/21  Blood Culture ID Panel (Reflexed) (Collected: 03/03/2021  8:36 PM)  Result Value Ref Range   Enterococcus faecalis NOT DETECTED NOT DETECTED   Enterococcus Faecium NOT DETECTED NOT DETECTED   Listeria monocytogenes NOT DETECTED NOT DETECTED   Staphylococcus species NOT DETECTED NOT DETECTED   Staphylococcus aureus (BCID) NOT DETECTED NOT DETECTED   Staphylococcus epidermidis NOT DETECTED NOT DETECTED   Staphylococcus lugdunensis NOT DETECTED NOT DETECTED   Streptococcus species DETECTED (A) NOT DETECTED   Streptococcus agalactiae NOT DETECTED NOT DETECTED   Streptococcus pneumoniae NOT DETECTED NOT DETECTED   Streptococcus pyogenes DETECTED (A) NOT DETECTED   A.calcoaceticus-baumannii NOT DETECTED NOT DETECTED   Bacteroides fragilis NOT DETECTED NOT DETECTED   Enterobacterales NOT DETECTED NOT DETECTED   Enterobacter cloacae complex NOT DETECTED NOT DETECTED   Escherichia coli NOT DETECTED NOT DETECTED   Klebsiella aerogenes NOT DETECTED NOT DETECTED   Klebsiella oxytoca NOT DETECTED NOT DETECTED   Klebsiella pneumoniae NOT DETECTED NOT DETECTED   Proteus species NOT DETECTED NOT DETECTED   Salmonella species NOT DETECTED NOT DETECTED   Serratia marcescens NOT DETECTED NOT DETECTED   Haemophilus influenzae NOT  DETECTED NOT DETECTED   Neisseria meningitidis NOT DETECTED NOT DETECTED   Pseudomonas aeruginosa NOT DETECTED NOT DETECTED   Stenotrophomonas maltophilia NOT DETECTED NOT DETECTED   Candida albicans NOT DETECTED NOT DETECTED   Candida auris NOT DETECTED NOT DETECTED   Candida glabrata NOT DETECTED NOT DETECTED   Candida krusei NOT DETECTED NOT DETECTED   Candida parapsilosis NOT DETECTED NOT DETECTED   Candida tropicalis NOT DETECTED NOT DETECTED   Cryptococcus neoformans/gattii NOT DETECTED NOT DETECTED   Shirlee More, PharmD PGY2 Infectious Diseases Pharmacy Resident   Please check [email or web address] for unit-specific pharmacy phone numbers

## 2021-03-04 NOTE — Sepsis Progress Note (Signed)
Secure chat sent to Dr. Wallace Cullens requesting orders for second Lactic acid.

## 2021-03-04 NOTE — Progress Notes (Addendum)
Progress Note     Subjective: Having abdominal pain this am - he points to left side. He denies nausea or emesis   Objective: Vital signs in last 24 hours: Temp:  [98.1 F (36.7 C)-100.4 F (38 C)] 99.1 F (37.3 C) (08/11 0421) Pulse Rate:  [127-145] 134 (08/11 0630) Resp:  [15-30] 28 (08/11 0630) BP: (65-133)/(56-117) 107/68 (08/11 0630) SpO2:  [97 %-100 %] 98 % (08/11 0630) Weight:  [79.4 kg] 79.4 kg (08/10 2050)    Intake/Output from previous day: 08/10 0701 - 08/11 0700 In: 2000 [IV Piggyback:2000] Out: -  Intake/Output this shift: No intake/output data recorded.  PE: General: laying in bed in NAD HEENT: head is normocephalic. Mouth is pink and moist Heart: Palpable radial pulse with tachycardia Lungs: Respiratory effort nonlabored on room air Abd: soft, ND, no masses, hernias, or organomegaly. Mild to moderate TTP of abdomen diffusely.  MSK: all 4 extremities are symmetrical with no cyanosis, clubbing, or edema. Skin: warm and dry  Lab Results:  Recent Labs    03/03/21 1901  WBC 2.2*  HGB 14.4  HCT 43.6  PLT PLATELET CLUMPS NOTED ON SMEAR, UNABLE TO ESTIMATE   BMET Recent Labs    03/03/21 1901 03/04/21 0553  NA 126* 126*  K 3.5 4.2  CL 93* 95*  CO2 14* 15*  GLUCOSE 65* 55*  BUN 33* 41*  CREATININE 3.18* 3.70*  CALCIUM 8.1* 7.2*   PT/INR Recent Labs    03/04/21 0553  LABPROT 18.8*  INR 1.6*   CMP     Component Value Date/Time   NA 126 (L) 03/04/2021 0553   NA 139 10/08/2020 1409   NA 141 09/27/2011 0740   K 4.2 03/04/2021 0553   K 3.2 (L) 09/27/2011 0740   CL 95 (L) 03/04/2021 0553   CL 104 09/27/2011 0740   CO2 15 (L) 03/04/2021 0553   CO2 28 09/27/2011 0740   GLUCOSE 55 (L) 03/04/2021 0553   GLUCOSE 99 09/27/2011 0740   BUN 41 (H) 03/04/2021 0553   BUN 10 10/08/2020 1409   BUN 12 09/27/2011 0740   CREATININE 3.70 (H) 03/04/2021 0553   CREATININE 1.05 08/25/2020 1008   CALCIUM 7.2 (L) 03/04/2021 0553   CALCIUM 8.1 (L)  09/27/2011 0740   PROT 7.6 03/04/2021 0553   PROT 7.9 10/08/2020 1409   PROT 8.7 (H) 09/27/2011 0040   ALBUMIN 2.2 (L) 03/04/2021 0553   ALBUMIN 3.1 (L) 10/08/2020 1409   ALBUMIN 3.3 (L) 09/27/2011 0040   AST 158 (H) 03/04/2021 0553   AST 32 09/27/2011 0040   ALT 41 03/04/2021 0553   ALT 20 09/27/2011 0040   ALKPHOS 63 03/04/2021 0553   ALKPHOS 52 09/27/2011 0040   BILITOT 4.6 (H) 03/04/2021 0553   BILITOT 0.3 10/08/2020 1409   BILITOT 0.7 09/27/2011 0040   GFRNONAA 21 (L) 03/04/2021 0553   GFRNONAA 90 08/25/2020 1008   GFRAA 105 08/25/2020 1008   Lipase     Component Value Date/Time   LIPASE 27 03/03/2021 1901   LIPASE 162 09/27/2011 0040       Studies/Results: CT ABDOMEN PELVIS WO CONTRAST  Result Date: 03/03/2021 CLINICAL DATA:  Acute abdominal pain. Possible sepsis. History of HIV. EXAM: CT ABDOMEN AND PELVIS WITHOUT CONTRAST TECHNIQUE: Multidetector CT imaging of the abdomen and pelvis was performed following the standard protocol without IV contrast. COMPARISON:  February 11, 2021 FINDINGS: Lower chest: Right lower lobe ground-glass nodules measuring up to 6 mm, likely infectious or inflammatory.  Hepatobiliary: Unremarkable noncontrast appearance of the hepatic parenchyma. Mild periportal edema. The gallbladder is dilated with an wall thickening pericholecystic fluid. No biliary ductal dilation. Pancreas: Peripancreatic fluid with unremarkable noncontrast appearance the pancreatic parenchyma. No pancreatic ductal dilation. Spleen: Within normal limits. Adrenals/Urinary Tract: Adrenal glands are unremarkable. Kidneys are normal, without renal calculi, contour deforming lesion, or hydronephrosis. Bladder is unremarkable degree of distension. Stomach/Bowel: Stomach is within normal limits. Normal appendix. No evidence of small bowel bowel wall thickening, distention, or inflammatory changes. There is rectal wall thickening with adjacent inflammatory stranding. Vascular/Lymphatic: No  abdominal aortic aneurysm. Evaluation of the abdomen pelvis for adenopathy is limited by an relative paucity of peritoneal fat and lack of IV contrast. However, there is interval increase in retroperitoneal adenopathy with the previously indexed left periaortic lymph node now measuring 17 mm on image 2/3 previously 12 mm. Reproductive: Prostate is unremarkable. Other: Walled off fluid collections. No pneumoperitoneum. No portal venous gas. Musculoskeletal: No acute or significant osseous findings. IMPRESSION: 1. Dilated gallbladder with pericholecystic fluid but without wall thickening. Findings which are equivocal for acute cholecystitis. Suggest correlation with tenderness to palpation and right upper quadrant ultrasound. 2. Peripancreatic fluid extending into the small bowel mesentery, possibly reflecting acute pancreatitis. Correlation with serum lipase suggested. 3. Rectal wall thickening with adjacent inflammatory stranding suggestive of proctitis. 4. Interval increase in retroperitoneal adenopathy, possibly reactive in this patient with reported history of HIV. 5. Right lower lobe ground-glass nodules measuring up to 6 mm, likely infectious or inflammatory. Electronically Signed   By: Maudry Mayhew MD   On: 03/03/2021 21:55   DG Chest Portable 1 View  Result Date: 03/03/2021 CLINICAL DATA:  Shortness of breath, nausea, vomiting EXAM: PORTABLE CHEST 1 VIEW COMPARISON:  02/09/2021 FINDINGS: Heart and mediastinal contours are within normal limits. No focal opacities or effusions. No acute bony abnormality. IMPRESSION: No active disease. Electronically Signed   By: Charlett Nose M.D.   On: 03/03/2021 20:19   US Abdomen Limited RUQ (LIVER/GB)  Result Date: 03/03/2021 CLINICAL DATA:  Right upper quadrant pain EXAM: ULTRASOUND ABDOMEN LIMITED RIGHT UPPER QUADRANT COMPARISON:  CT 03/03/2021 FINDINGS: Gallbladder: Borderline gallbladder wall distension. No significant gallbladder wall thickening. No visible  gallstones. Sonographic Eulah Pont sign is reportedly negative. Common bile duct: Diameter: 3.9 mm, nondilated Liver: Diffusely diminished hepatic echogenicity. No concerning focal lesion. Smooth surface contour. Portal vein is patent on color Doppler imaging with normal direction of blood flow towards the liver. Other: Unable to obtain decubitus imaging due to patient's status. IMPRESSION: Distended gallbladder without significant wall thickening or sonographic Murphy sign. Felt less likely to reflect an acute cholecystitis though if there is persisting concern, HIDA could be obtained. Diffusely decreased hepatic echogenicity. Nonspecific though can be seen with intrinsic liver disease. Recommend correlation with serology and patient history. Electronically Signed   By: Kreg Shropshire M.D.   On: 03/03/2021 23:54    Anti-infectives: Anti-infectives (From admission, onward)    Start     Dose/Rate Route Frequency Ordered Stop   03/04/21 0800  Darunavir-Cobicisctat-Emtricitabine-Tenofovir Alafenamide (SYMTUZA) 800-150-200-10 MG TABS 1 tablet        1 tablet Oral Daily with breakfast 03/04/21 0348     03/03/21 2130  meropenem (MERREM) 1 g in sodium chloride 0.9 % 100 mL IVPB        1 g 200 mL/hr over 30 Minutes Intravenous Every 12 hours 03/03/21 2040          Assessment/Plan 35 y.o. male with h/o AIDS, intermittent  compliance with HIV medication Dilated GB with pericholecystic fluid on CT - US findings of no gallbladder wall thickening, no dilation of CBD - do not think clinical picture consistent with acute cholecystitis as the source of his sepsis at this time; however, given diffuse abdominal tenderness on exam will get HIDA  - T bili 4.6 (2.9) - monitor LFTs - would consider ID involvement  FEN: NPO, IVF per primary ID: [email or web address], meropenem VTE: [email or web address]  Sepsis AKI Lactic acidosis Aids with poor compliance Substance abuse    LOS: 0 days    Eric Form, [facility]  Surgery 03/04/2021, 7:48 AM Please see Amion for pager number during day hours 7:00am-4:30pm

## 2021-03-04 NOTE — Consult Note (Signed)
CC: Sepsis, concern for cholecystitis  Requesting provider: Dr. Wallace Cullens  HPI: Jacob Gutierrez is an 35 y.o. malewith hx of AIDS, intermittent compliance with HIV medication, syphilis, shigella, coccidioidomycosis, cryptosporidium -presented to the emergency department with complaints that he believed he thought he may have a urinary tract infection.  He reports bilateral flank pain and having had no urine output for a few days now.  He denies any abdominal pain at this time.  He denies any blood in his stool.  He does report some looser bowel movements.  He denies any nausea or vomiting at present.  He underwent work-up in the emergency department and there was equivocal findings on of the CT scan for the possibility of cholecystitis.  We were asked to see.  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 [facility])    H/O coccidioidomycosis    pulmonary    HIV disease (HCC)    Past history of allergy to penicillin-type antibiotic 07-03-2012   desensitization   PNA (pneumonia)    Shigella gastroenteritis    Syphilis    history /treated     Past Surgical History:  Procedure Laterality Date   BIOPSY  10/16/2020   Procedure: BIOPSY;  Surgeon: Lemar Lofty., MD;  Location: [facility] ENDOSCOPY;  Service: Gastroenterology;;   BIOPSY  10/23/2020   Procedure: BIOPSY;  Surgeon: Lynann Bologna, MD;  Location: [facility] ENDOSCOPY;  Service: Endoscopy;;   COLONOSCOPY WITH PROPOFOL N/A 10/23/2020   Procedure: COLONOSCOPY WITH PROPOFOL;  Surgeon: Lynann Bologna, MD;  Location: [facility] ENDOSCOPY;  Service: Endoscopy;  Laterality: N/A;   ESOPHAGOGASTRODUODENOSCOPY (EGD) WITH PROPOFOL N/A 10/16/2020   Procedure: ESOPHAGOGASTRODUODENOSCOPY (EGD) WITH PROPOFOL;  Surgeon: Meridee Score Netty Starring., MD;  Location: [facility] ENDOSCOPY;  Service: Gastroenterology;   Laterality: N/A;   TRANSURETHRAL RESECTION OF PROSTATE N/A 12/09/2017   Procedure: TRANSURETHRAL RESECTION OF THE PROSTATE (TURP);  Surgeon: Crist Fat, MD;  Location: [facility] ORS;  Service: Urology;  Laterality: N/A;    Family History  Problem Relation Age of Onset   Hypertension Mother    Lupus Maternal Grandmother    Diabetes Maternal Grandmother    Cancer Paternal Grandfather        unknown    Prostate cancer Father    Diabetes Father    Diabetes Maternal Grandfather    Colon cancer Neg Hx    Stomach cancer Neg Hx    Esophageal cancer Neg Hx    Pancreatic cancer Neg Hx     Social:  reports that he has been smoking cigarettes. He has been smoking an average of .3 packs per day. He has never used smokeless tobacco. He reports previous alcohol use of about 1.0 standard drink of alcohol per week. He reports previous drug use. Frequency: 1.00 time per week. Drug: Marijuana.  Allergies:  Allergies  Allergen Reactions   Vancomycin Anaphylaxis, Itching, Swelling and Other (See Comments)    Angioedema and "everything swells"   Amoxicillin Other (See Comments)    From childhood: "I had a reaction when i was little." (??)    Medications: I have reviewed the patient's current medications.  Results for orders placed or performed during the hospital encounter of 03/03/21 (from the past 48 hour(s))  CBC with Differential     Status: Abnormal   Collection Time: 03/03/21  7:01 PM  Result Value  Ref Range   WBC 2.2 (L) 4.0 - 10.5 K/uL   RBC 4.71 4.22 - 5.81 MIL/uL   Hemoglobin 14.4 13.0 - 17.0 g/dL   HCT 52.8 41.3 - 24.4 %   MCV 92.6 80.0 - 100.0 fL   MCH 30.6 26.0 - 34.0 pg   MCHC 33.0 30.0 - 36.0 g/dL   RDW 01.0 (H) 27.2 - 53.6 %   Platelets PLATELET CLUMPS NOTED ON SMEAR, UNABLE TO ESTIMATE 150 - 400 K/uL    Comment: Immature Platelet Fraction may be clinically indicated, consider ordering this additional test [id number]    nRBC 0.0 0.0 - 0.2 %   Neutrophils Relative % 77 %    Neutro Abs 1.7 1.7 - 7.7 K/uL   Lymphocytes Relative 8 %   Lymphs Abs 0.2 (L) 0.7 - 4.0 K/uL   Monocytes Relative 10 %   Monocytes Absolute 0.2 0.1 - 1.0 K/uL   Eosinophils Relative 4 %   Eosinophils Absolute 0.1 0.0 - 0.5 K/uL   Basophils Relative 1 %   Basophils Absolute 0.0 0.0 - 0.1 K/uL   WBC Morphology MILD LEFT SHIFT (1-5% METAS, OCC MYELO, OCC BANDS)     Comment: INCREASED BANDS (>20% BANDS)   RBC Morphology MORPHOLOGY UNREMARKABLE    Smear Review CELLULAR DEGENERATION NOTED    Abs Immature Granulocytes 0.00 0.00 - 0.07 K/uL    Comment: Performed at [facility], [address] [place], [place] [address]  Comprehensive metabolic panel     Status: Abnormal   Collection Time: 03/03/21  7:01 PM  Result Value Ref Range   Sodium 126 (L) 135 - 145 mmol/L   Potassium 3.5 3.5 - 5.1 mmol/L   Chloride 93 (L) 98 - 111 mmol/L   CO2 14 (L) 22 - 32 mmol/L   Glucose, Bld 65 (L) 70 - 99 mg/dL    Comment: Glucose reference range applies only to samples taken after fasting for at least 8 hours.   BUN 33 (H) 6 - 20 mg/dL   Creatinine, Ser 3.87 (H) 0.61 - 1.24 mg/dL   Calcium 8.1 (L) 8.9 - 10.3 mg/dL   Total Protein 9.6 (H) 6.5 - 8.1 g/dL   Albumin 2.9 (L) 3.5 - 5.0 g/dL   AST 72 (H) 15 - 41 U/L   ALT 26 0 - 44 U/L   Alkaline Phosphatase 71 38 - 126 U/L   Total Bilirubin 2.9 (H) 0.3 - 1.2 mg/dL   GFR, Estimated 25 (L) >60 mL/min    Comment: (NOTE) Calculated using the CKD-EPI Creatinine Equation (2021)    Anion gap 19 (H) 5 - 15    Comment: Performed at [facility], [address], [place], [place] [address]  Lipase, blood     Status: None   Collection Time: 03/03/21  7:01 PM  Result Value Ref Range   Lipase 27 11 - 51 U/L    Comment: Performed at [facility], [address] [place], [place] [address]  Lactic acid, plasma     Status: Abnormal   Collection Time: 03/03/21 10:12 PM  Result Value Ref Range   Lactic Acid, Venous 5.7 (HH) 0.5 - 1.9 mmol/L     Comment: CRITICAL RESULT CALLED TO, READ BACK BY AND VERIFIED WITH: Scarlett Presto 03/03/21 2247 WAYK Performed at [facility], [address], [place], [place] [address]   Resp Panel by RT-PCR (Flu A&B, Covid) Nasopharyngeal Swab     Status: None  Collection Time: 03/03/21 10:49 PM   Specimen: Nasopharyngeal Swab; Nasopharyngeal(NP) swabs in vial transport medium  Result Value Ref Range   SARS Coronavirus 2 by RT PCR NEGATIVE NEGATIVE    Comment: (NOTE) SARS-CoV-2 target nucleic acids are NOT DETECTED.  The SARS-CoV-2 RNA is generally detectable in upper respiratory specimens during the acute phase of infection. The lowest concentration of SARS-CoV-2 viral copies this assay can detect is 138 copies/mL. A negative result does not preclude SARS-Cov-2 infection and should not be used as the sole basis for treatment or other patient management decisions. A negative result may occur with  improper specimen collection/handling, submission of specimen other than nasopharyngeal swab, presence of viral mutation(s) within the areas targeted by this assay, and inadequate number of viral copies(<138 copies/mL). A negative result must be combined with clinical observations, patient history, and epidemiological information. The expected result is Negative.  Fact Sheet for Patients:  BloggerCourse.com  Fact Sheet for Healthcare Providers:  SeriousBroker.it  This test is no t yet approved or cleared by the [place] FDA and  has been authorized for detection and/or diagnosis of SARS-CoV-2 by FDA under an Emergency Use Authorization (EUA). This EUA will remain  in effect (meaning this test can be used) for the duration of the COVID-19 declaration under Section 564(b)(1) of the Act, 21 U.S.C.section 360bbb-3(b)(1), unless the authorization is terminated  or revoked sooner.       Influenza A by PCR NEGATIVE NEGATIVE   Influenza B by PCR  NEGATIVE NEGATIVE    Comment: (NOTE) The Xpert Xpress SARS-CoV-2/FLU/RSV plus assay is intended as an aid in the diagnosis of influenza from Nasopharyngeal swab specimens and should not be used as a sole basis for treatment. Nasal washings and aspirates are unacceptable for Xpert Xpress SARS-CoV-2/FLU/RSV testing.  Fact Sheet for Patients: BloggerCourse.com  Fact Sheet for Healthcare Providers: SeriousBroker.it  This test is not yet approved or cleared by the [place] FDA and has been authorized for detection and/or diagnosis of SARS-CoV-2 by FDA under an Emergency Use Authorization (EUA). This EUA will remain in effect (meaning this test can be used) for the duration of the COVID-19 declaration under Section 564(b)(1) of the Act, 21 U.S.C. section 360bbb-3(b)(1), unless the authorization is terminated or revoked.  Performed at [facility], [address] [place], [place] [address]     CT ABDOMEN PELVIS WO CONTRAST  Result Date: 03/03/2021 CLINICAL DATA:  Acute abdominal pain. Possible sepsis. History of HIV. EXAM: CT ABDOMEN AND PELVIS WITHOUT CONTRAST TECHNIQUE: Multidetector CT imaging of the abdomen and pelvis was performed following the standard protocol without IV contrast. COMPARISON:  February 11, 2021 FINDINGS: Lower chest: Right lower lobe ground-glass nodules measuring up to 6 mm, likely infectious or inflammatory. Hepatobiliary: Unremarkable noncontrast appearance of the hepatic parenchyma. Mild periportal edema. The gallbladder is dilated with an wall thickening pericholecystic fluid. No biliary ductal dilation. Pancreas: Peripancreatic fluid with unremarkable noncontrast appearance the pancreatic parenchyma. No pancreatic ductal dilation. Spleen: Within normal limits. Adrenals/Urinary Tract: Adrenal glands are unremarkable. Kidneys are normal, without renal calculi, contour deforming lesion, or hydronephrosis.  Bladder is unremarkable degree of distension. Stomach/Bowel: Stomach is within normal limits. Normal appendix. No evidence of small bowel bowel wall thickening, distention, or inflammatory changes. There is rectal wall thickening with adjacent inflammatory stranding. Vascular/Lymphatic: No abdominal aortic aneurysm. Evaluation of the abdomen pelvis for adenopathy is limited by an relative paucity of peritoneal fat and lack of IV contrast. However, there is interval increase  in retroperitoneal adenopathy with the previously indexed left periaortic lymph node now measuring 17 mm on image 2/3 previously 12 mm. Reproductive: Prostate is unremarkable. Other: Walled off fluid collections. No pneumoperitoneum. No portal venous gas. Musculoskeletal: No acute or significant osseous findings. IMPRESSION: 1. Dilated gallbladder with pericholecystic fluid but without wall thickening. Findings which are equivocal for acute cholecystitis. Suggest correlation with tenderness to palpation and right upper quadrant ultrasound. 2. Peripancreatic fluid extending into the small bowel mesentery, possibly reflecting acute pancreatitis. Correlation with serum lipase suggested. 3. Rectal wall thickening with adjacent inflammatory stranding suggestive of proctitis. 4. Interval increase in retroperitoneal adenopathy, possibly reactive in this patient with reported history of HIV. 5. Right lower lobe ground-glass nodules measuring up to 6 mm, likely infectious or inflammatory. Electronically Signed   By: Maudry Mayhew MD   On: 03/03/2021 21:55   DG Chest Portable 1 View  Result Date: 03/03/2021 CLINICAL DATA:  Shortness of breath, nausea, vomiting EXAM: PORTABLE CHEST 1 VIEW COMPARISON:  02/09/2021 FINDINGS: Heart and mediastinal contours are within normal limits. No focal opacities or effusions. No acute bony abnormality. IMPRESSION: No active disease. Electronically Signed   By: Charlett Nose M.D.   On: 03/03/2021 20:19   US Abdomen  Limited RUQ (LIVER/GB)  Result Date: 03/03/2021 CLINICAL DATA:  Right upper quadrant pain EXAM: ULTRASOUND ABDOMEN LIMITED RIGHT UPPER QUADRANT COMPARISON:  CT 03/03/2021 FINDINGS: Gallbladder: Borderline gallbladder wall distension. No significant gallbladder wall thickening. No visible gallstones. Sonographic Eulah Pont sign is reportedly negative. Common bile duct: Diameter: 3.9 mm, nondilated Liver: Diffusely diminished hepatic echogenicity. No concerning focal lesion. Smooth surface contour. Portal vein is patent on color Doppler imaging with normal direction of blood flow towards the liver. Other: Unable to obtain decubitus imaging due to patient's status. IMPRESSION: Distended gallbladder without significant wall thickening or sonographic Murphy sign. Felt less likely to reflect an acute cholecystitis though if there is persisting concern, HIDA could be obtained. Diffusely decreased hepatic echogenicity. Nonspecific though can be seen with intrinsic liver disease. Recommend correlation with serology and patient history. Electronically Signed   By: Kreg Shropshire M.D.   On: 03/03/2021 23:54    ROS - unable to review 2/2 condition of the patient - would not answer these questions  PE Blood pressure 104/83, pulse (!) 145, temperature (!) 100.4 F (38 C), temperature source Oral, resp. rate (!) 27, weight 79.4 kg, SpO2 97 %. Constitutional: fatigued, generally ill appearing with temporal wasting; conversant; no deformities Eyes: Moist conjunctiva; no lid lag; anicteric; PERRL Neck: Trachea midline; no thyromegaly Lungs: Normal respiratory effort; no tactile fremitus CV: RRR; no palpable thrills; no pitting edema GI: Abd soft, nontender, nondistended; no palpable hepatosplenomegaly MSK: Normal range of motion of extremities; no clubbing/cyanosis Psychiatric: Appropriate affect; alert and oriented x3 Lymphatic: No palpable cervical or axillary lymphadenopathy  Results for orders placed or performed  during the hospital encounter of 03/03/21 (from the past 48 hour(s))  CBC with Differential     Status: Abnormal   Collection Time: 03/03/21  7:01 PM  Result Value Ref Range   WBC 2.2 (L) 4.0 - 10.5 K/uL   RBC 4.71 4.22 - 5.81 MIL/uL   Hemoglobin 14.4 13.0 - 17.0 g/dL   HCT 78.2 95.6 - 21.3 %   MCV 92.6 80.0 - 100.0 fL   MCH 30.6 26.0 - 34.0 pg   MCHC 33.0 30.0 - 36.0 g/dL   RDW 08.6 (H) 57.8 - 46.9 %   Platelets PLATELET CLUMPS NOTED ON SMEAR,  UNABLE TO ESTIMATE 150 - 400 K/uL    Comment: Immature Platelet Fraction may be clinically indicated, consider ordering this additional test [id number]    nRBC 0.0 0.0 - 0.2 %   Neutrophils Relative % 77 %   Neutro Abs 1.7 1.7 - 7.7 K/uL   Lymphocytes Relative 8 %   Lymphs Abs 0.2 (L) 0.7 - 4.0 K/uL   Monocytes Relative 10 %   Monocytes Absolute 0.2 0.1 - 1.0 K/uL   Eosinophils Relative 4 %   Eosinophils Absolute 0.1 0.0 - 0.5 K/uL   Basophils Relative 1 %   Basophils Absolute 0.0 0.0 - 0.1 K/uL   WBC Morphology MILD LEFT SHIFT (1-5% METAS, OCC MYELO, OCC BANDS)     Comment: INCREASED BANDS (>20% BANDS)   RBC Morphology MORPHOLOGY UNREMARKABLE    Smear Review CELLULAR DEGENERATION NOTED    Abs Immature Granulocytes 0.00 0.00 - 0.07 K/uL    Comment: Performed at [facility], [address] [place], [place] [address]  Comprehensive metabolic panel     Status: Abnormal   Collection Time: 03/03/21  7:01 PM  Result Value Ref Range   Sodium 126 (L) 135 - 145 mmol/L   Potassium 3.5 3.5 - 5.1 mmol/L   Chloride 93 (L) 98 - 111 mmol/L   CO2 14 (L) 22 - 32 mmol/L   Glucose, Bld 65 (L) 70 - 99 mg/dL    Comment: Glucose reference range applies only to samples taken after fasting for at least 8 hours.   BUN 33 (H) 6 - 20 mg/dL   Creatinine, Ser 1.193.18 (H) 0.61 - 1.24 mg/dL   Calcium 8.1 (L) 8.9 - 10.3 mg/dL   Total Protein 9.6 (H) 6.5 - 8.1 g/dL   Albumin 2.9 (L) 3.5 - 5.0 g/dL   AST 72 (H) 15 - 41 U/L   ALT 26 0 - 44 U/L   Alkaline  Phosphatase 71 38 - 126 U/L   Total Bilirubin 2.9 (H) 0.3 - 1.2 mg/dL   GFR, Estimated 25 (L) >60 mL/min    Comment: (NOTE) Calculated using the CKD-EPI Creatinine Equation (2021)    Anion gap 19 (H) 5 - 15    Comment: Performed at [facility], [address] [place], [place] [address]  Lipase, blood     Status: None   Collection Time: 03/03/21  7:01 PM  Result Value Ref Range   Lipase 27 11 - 51 U/L    Comment: Performed at [facility], [address] [place], [place] [address]  Lactic acid, plasma     Status: Abnormal   Collection Time: 03/03/21 10:12 PM  Result Value Ref Range   Lactic Acid, Venous 5.7 (HH) 0.5 - 1.9 mmol/L    Comment: CRITICAL RESULT CALLED TO, READ BACK BY AND VERIFIED WITH: Scarlett PrestoYANG C,RN 03/03/21 2247 WAYK Performed at [facility], [address] [place], [place] [address]   Resp Panel by RT-PCR (Flu A&B, Covid) Nasopharyngeal Swab     Status: None   Collection Time: 03/03/21 10:49 PM   Specimen: Nasopharyngeal Swab; Nasopharyngeal(NP) swabs in vial transport medium  Result Value Ref Range   SARS Coronavirus 2 by RT PCR NEGATIVE NEGATIVE    Comment: (NOTE) SARS-CoV-2 target nucleic acids are NOT DETECTED.  The SARS-CoV-2 RNA is generally detectable in upper respiratory specimens during the acute phase of infection. The lowest concentration of SARS-CoV-2 viral copies this assay can detect is 138 copies/mL. A negative result  does not preclude SARS-Cov-2 infection and should not be used as the sole basis for treatment or other patient management decisions. A negative result may occur with  improper specimen collection/handling, submission of specimen other than nasopharyngeal swab, presence of viral mutation(s) within the areas targeted by this assay, and inadequate number of viral copies(<138 copies/mL). A negative result must be combined with clinical observations, patient history, and epidemiological information. The expected result  is Negative.  Fact Sheet for Patients:  BloggerCourse.com  Fact Sheet for Healthcare Providers:  SeriousBroker.it  This test is no t yet approved or cleared by the [place] FDA and  has been authorized for detection and/or diagnosis of SARS-CoV-2 by FDA under an Emergency Use Authorization (EUA). This EUA will remain  in effect (meaning this test can be used) for the duration of the COVID-19 declaration under Section 564(b)(1) of the Act, 21 U.S.C.section 360bbb-3(b)(1), unless the authorization is terminated  or revoked sooner.       Influenza A by PCR NEGATIVE NEGATIVE   Influenza B by PCR NEGATIVE NEGATIVE    Comment: (NOTE) The Xpert Xpress SARS-CoV-2/FLU/RSV plus assay is intended as an aid in the diagnosis of influenza from Nasopharyngeal swab specimens and should not be used as a sole basis for treatment. Nasal washings and aspirates are unacceptable for Xpert Xpress SARS-CoV-2/FLU/RSV testing.  Fact Sheet for Patients: BloggerCourse.com  Fact Sheet for Healthcare Providers: SeriousBroker.it  This test is not yet approved or cleared by the [place] FDA and has been authorized for detection and/or diagnosis of SARS-CoV-2 by FDA under an Emergency Use Authorization (EUA). This EUA will remain in effect (meaning this test can be used) for the duration of the COVID-19 declaration under Section 564(b)(1) of the Act, 21 U.S.C. section 360bbb-3(b)(1), unless the authorization is terminated or revoked.  Performed at [facility], [address], [place], [place] [address]     CT ABDOMEN PELVIS WO CONTRAST  Result Date: 03/03/2021 CLINICAL DATA:  Acute abdominal pain. Possible sepsis. History of HIV. EXAM: CT ABDOMEN AND PELVIS WITHOUT CONTRAST TECHNIQUE: Multidetector CT imaging of the abdomen and pelvis was performed following the standard protocol without  IV contrast. COMPARISON:  February 11, 2021 FINDINGS: Lower chest: Right lower lobe ground-glass nodules measuring up to 6 mm, likely infectious or inflammatory. Hepatobiliary: Unremarkable noncontrast appearance of the hepatic parenchyma. Mild periportal edema. The gallbladder is dilated with an wall thickening pericholecystic fluid. No biliary ductal dilation. Pancreas: Peripancreatic fluid with unremarkable noncontrast appearance the pancreatic parenchyma. No pancreatic ductal dilation. Spleen: Within normal limits. Adrenals/Urinary Tract: Adrenal glands are unremarkable. Kidneys are normal, without renal calculi, contour deforming lesion, or hydronephrosis. Bladder is unremarkable degree of distension. Stomach/Bowel: Stomach is within normal limits. Normal appendix. No evidence of small bowel bowel wall thickening, distention, or inflammatory changes. There is rectal wall thickening with adjacent inflammatory stranding. Vascular/Lymphatic: No abdominal aortic aneurysm. Evaluation of the abdomen pelvis for adenopathy is limited by an relative paucity of peritoneal fat and lack of IV contrast. However, there is interval increase in retroperitoneal adenopathy with the previously indexed left periaortic lymph node now measuring 17 mm on image 2/3 previously 12 mm. Reproductive: Prostate is unremarkable. Other: Walled off fluid collections. No pneumoperitoneum. No portal venous gas. Musculoskeletal: No acute or significant osseous findings. IMPRESSION: 1. Dilated gallbladder with pericholecystic fluid but without wall thickening. Findings which are equivocal for acute cholecystitis. Suggest correlation with tenderness to palpation and right upper quadrant ultrasound. 2. Peripancreatic fluid extending into  the small bowel mesentery, possibly reflecting acute pancreatitis. Correlation with serum lipase suggested. 3. Rectal wall thickening with adjacent inflammatory stranding suggestive of proctitis. 4. Interval increase in  retroperitoneal adenopathy, possibly reactive in this patient with reported history of HIV. 5. Right lower lobe ground-glass nodules measuring up to 6 mm, likely infectious or inflammatory. Electronically Signed   By: Maudry Mayhew MD   On: 03/03/2021 21:55   DG Chest Portable 1 View  Result Date: 03/03/2021 CLINICAL DATA:  Shortness of breath, nausea, vomiting EXAM: PORTABLE CHEST 1 VIEW COMPARISON:  02/09/2021 FINDINGS: Heart and mediastinal contours are within normal limits. No focal opacities or effusions. No acute bony abnormality. IMPRESSION: No active disease. Electronically Signed   By: Charlett Nose M.D.   On: 03/03/2021 20:19   US Abdomen Limited RUQ (LIVER/GB)  Result Date: 03/03/2021 CLINICAL DATA:  Right upper quadrant pain EXAM: ULTRASOUND ABDOMEN LIMITED RIGHT UPPER QUADRANT COMPARISON:  CT 03/03/2021 FINDINGS: Gallbladder: Borderline gallbladder wall distension. No significant gallbladder wall thickening. No visible gallstones. Sonographic Eulah Pont sign is reportedly negative. Common bile duct: Diameter: 3.9 mm, nondilated Liver: Diffusely diminished hepatic echogenicity. No concerning focal lesion. Smooth surface contour. Portal vein is patent on color Doppler imaging with normal direction of blood flow towards the liver. Other: Unable to obtain decubitus imaging due to patient's status. IMPRESSION: Distended gallbladder without significant wall thickening or sonographic Murphy sign. Felt less likely to reflect an acute cholecystitis though if there is persisting concern, HIDA could be obtained. Diffusely decreased hepatic echogenicity. Nonspecific though can be seen with intrinsic liver disease. Recommend correlation with serology and patient history. Electronically Signed   By: Kreg Shropshire M.D.   On: 03/03/2021 23:54     A/P: Jacob Gutierrez is an 35 y.o. male with x of AIDS, intermittent compliance with HIV medication, syphilis, shigella, coccidioidomycosis, cryptosporidium - now  with acute renal failure and sepsis of undetermined origin at present  -RUQ US shows no gallbladder wall thickening and he has no right upper quadrant tenderness on exam. I do not think the gallbladder is the source of his sepsis. He was seen 2 weeks ago by urology for pyelonephritis and AKI and I suspect this is the likely source. Given severely immunosuppressed state and his relative illness with somewhat equivocal imaging findings, can obtain HIDA scan to rule out gallbladder as source -Would have him seen by ID  Marin Olp, [facility] Use [email or web address] to contact on call provider

## 2021-03-04 NOTE — ED Notes (Signed)
Patient returned to room from nuclear medicine.

## 2021-03-04 NOTE — Progress Notes (Signed)
Pharmacy Antibiotic Note  Jacob Gutierrez is a 35 y.o. male admitted on 03/03/2021 with strep bacteremia and hx ESBL UTI in July. Pt started on meropenem empirically and then changed to ceftriaxone with strep pyogenes growing in blood cultures, now to broaden to linezolid and meropenem again with shock tonight. SCr remains elevated, WBC low so will need to watch closely with linezolid.  Plan: Meropenem 1g q12h Linezolid 600mg  IV q12h per MD   Weight: 79.4 kg (175 lb)  Temp (24hrs), Avg:98.6 F (37 C), Min:97.1 F (36.2 C), Max:99.4 F (37.4 C)  Recent Labs  Lab 03/03/21 1901 03/03/21 2212 03/04/21 0437 03/04/21 0553 03/04/21 1133  WBC 2.2*  --   --   --  2.2*  CREATININE 3.18*  --   --  3.70*  --   LATICACIDVEN  --  5.7* >11.0* 5.5* 4.3*     Estimated Creatinine Clearance: 30.7 mL/min (A) (by C-G formula based on SCr of 3.7 mg/dL (H)).    Allergies  Allergen Reactions   Vancomycin Anaphylaxis, Itching, Swelling and Other (See Comments)    Angioedema and "everything swells"   Amoxicillin Other (See Comments)    From childhood: "I had a reaction when i was little." (??) Has tolerated multiple cephalosporins in the past    05/04/21, PharmD, BCPS, [facility] Clinical Pharmacist [phone] Please check AMION for all [facility] numbers 03/04/2021

## 2021-03-04 NOTE — Progress Notes (Signed)
Pt. Arrived to unit via stretcher. When he went to get in bed pt. Was moaning loudy saying he was cold and hurting. Nurse tech preceded with pt. Vitals. HR was 150s. Pt. Was rolling around so much due to chill. Tech was not able to get full vitals. Nurse gave pt. Suppository tylenol to help with pain and chills. Pt. Finall set for a full set of vitals. HR still high. Nurse let provider know of pt. HR. Provider verified that it was due to infection and to keep an eye on it.   HR remained high, for 1 hour, nurse called RRT nurse to assess pt. RRT nurse informed us to get a blood glucose level. It was 13. Nurse push 41mL D50. 15 min Recheck was at 75. RRT then called provider and got new orders place. LR with D50, and q4 BG monitoring and continue to watching pt. HR.

## 2021-03-04 NOTE — Significant Event (Signed)
Rapid Response Event Note   Reason for Call :  HR 147 bpm  Initial Focused Assessment:  Pt lying in bed, AO. Skin is cold, dry. Heart rate is regular, fast. P-waves noted on bedside EKG monitor. No adventitious heart sounds. Pt denies lightheadedness, or dizziness. He endorses generalized pain. He endorses nausea and states he has not eaten since yesterday.  CBG: 13 VS: T 99.56F, BP 112/57, HR 145, RR 28, SpO2 100% on room air  Interventions:  -CBG: 13 - hypoglycemic standing orders followed, pt c/o nausea, emesis- IV Dextrose 50% given - follow-up CBG 75  Plan of Care:  -Dextrose added to maintenance fluids -Scheduled CBG checks -MD aware of HR sustaining 140-150 bpm, at this time wants to continue treating with IVF  Call rapid response for additional needs  Event Summary:  MD Notified: Dr. Natale Milch Call Time: 1607 Arrival Time: 1610 End Time: 1640  Jennye Moccasin, RN

## 2021-03-04 NOTE — Progress Notes (Signed)
Received a call from bedside RN regarding patient being hypotensive despite midodrine and IV fluid boluses.  Reviewed the chart.  Blood cultures obtained on 03/03/2021 positive for Streptococcus pyogenes, patient on Rocephin and IV Flagyl.  Was on meropenem earlier on admission.  However his clinical picture is without significant improvement and appears to be worsening.  Ordered urine analysis and urine culture, MRSA screening test, CBC with differential, CMP, lactic acid, VBG, acute hepatitis panel, CD4 count, HIV viral load.    Last urine culture obtained on 02/09/2021 was positive for ESBL E. coli.  CD4 count was less than 35 on 10/16/2020.  Patient had an EGD on 10/16/2020 which showed oropharyngeal candidiasis.  Leukopenia and neutropenia today with WBC 2.2 and neutrophil count of 0.4.  Hypotensive and tachycardic, likely sepsis driven.  Due to concern for worsening clinical picture, his antibiotics coverage has been broadened.  Restarted meropenem, added linezolid and Diflucan.  We will continue to closely monitor and treat as indicated.

## 2021-03-04 NOTE — ED Notes (Signed)
Patient returned to room stating he did not urinate and was not able to provide a urine specimen.

## 2021-03-04 NOTE — ED Notes (Signed)
Patient to bathroom to provide urine specimen.

## 2021-03-04 NOTE — Sepsis Progress Note (Signed)
Sent secure chat to RN for follow up on second lactic, awaiting response.  Epic had been down for scheduled maintenance during this time.

## 2021-03-05 DIAGNOSIS — A400 Sepsis due to streptococcus, group A: Secondary | ICD-10-CM

## 2021-03-05 DIAGNOSIS — L8990 Pressure ulcer of unspecified site, unspecified stage: Secondary | ICD-10-CM | POA: Insufficient documentation

## 2021-03-05 DIAGNOSIS — B950 Streptococcus, group A, as the cause of diseases classified elsewhere: Secondary | ICD-10-CM

## 2021-03-05 LAB — COMPREHENSIVE METABOLIC PANEL
ALT: 37 U/L (ref 0–44)
AST: 84 U/L — ABNORMAL HIGH (ref 15–41)
Albumin: 1.7 g/dL — ABNORMAL LOW (ref 3.5–5.0)
Alkaline Phosphatase: 68 U/L (ref 38–126)
Anion gap: 13 (ref 5–15)
BUN: 56 mg/dL — ABNORMAL HIGH (ref 6–20)
CO2: 18 mmol/L — ABNORMAL LOW (ref 22–32)
Calcium: 6.8 mg/dL — ABNORMAL LOW (ref 8.9–10.3)
Chloride: 94 mmol/L — ABNORMAL LOW (ref 98–111)
Creatinine, Ser: 5.17 mg/dL — ABNORMAL HIGH (ref 0.61–1.24)
GFR, Estimated: 14 mL/min — ABNORMAL LOW (ref >60)
Glucose, Bld: 92 mg/dL (ref 70–99)
Potassium: 4.5 mmol/L (ref 3.5–5.1)
Sodium: 125 mmol/L — ABNORMAL LOW (ref 135–145)
Total Bilirubin: 3.4 mg/dL — ABNORMAL HIGH (ref 0.3–1.2)
Total Protein: 6.2 g/dL — ABNORMAL LOW (ref 6.5–8.1)

## 2021-03-05 LAB — GLUCOSE, CAPILLARY
Glucose-Capillary: 84 mg/dL (ref 70–99)
Glucose-Capillary: 51 mg/dL — ABNORMAL LOW (ref 70–99)
Glucose-Capillary: 67 mg/dL — ABNORMAL LOW (ref 70–99)
Glucose-Capillary: 74 mg/dL (ref 70–99)
Glucose-Capillary: 53 mg/dL — ABNORMAL LOW (ref 70–99)
Glucose-Capillary: 124 mg/dL — ABNORMAL HIGH (ref 70–99)
Glucose-Capillary: 66 mg/dL — ABNORMAL LOW (ref 70–99)
Glucose-Capillary: 68 mg/dL — ABNORMAL LOW (ref 70–99)
Glucose-Capillary: 113 mg/dL — ABNORMAL HIGH (ref 70–99)
Glucose-Capillary: 74 mg/dL (ref 70–99)

## 2021-03-05 LAB — URINALYSIS, ROUTINE W REFLEX MICROSCOPIC
Bilirubin Urine: NEGATIVE
Glucose, UA: NEGATIVE mg/dL
Ketones, ur: NEGATIVE mg/dL
Nitrite: NEGATIVE
Protein, ur: >=300 mg/dL — AB
Specific Gravity, Urine: 1.016 (ref 1.005–1.030)
pH: 5 (ref 5.0–8.0)

## 2021-03-05 LAB — SODIUM
Sodium: 127 mmol/L — ABNORMAL LOW (ref 135–145)
Sodium: 126 mmol/L — ABNORMAL LOW (ref 135–145)
Sodium: 124 mmol/L — ABNORMAL LOW (ref 135–145)

## 2021-03-05 LAB — CBC WITH DIFFERENTIAL/PLATELET
Abs Immature Granulocytes: 0.05 10*3/uL (ref 0.00–0.07)
Basophils Absolute: 0 10*3/uL (ref 0.0–0.1)
Basophils Relative: 1 %
Eosinophils Absolute: 0.6 10*3/uL — ABNORMAL HIGH (ref 0.0–0.5)
Eosinophils Relative: 16 %
HCT: 34.7 % — ABNORMAL LOW (ref 39.0–52.0)
Hemoglobin: 11.9 g/dL — ABNORMAL LOW (ref 13.0–17.0)
Immature Granulocytes: 1 %
Lymphocytes Relative: 2 %
Lymphs Abs: 0.1 10*3/uL — ABNORMAL LOW (ref 0.7–4.0)
MCH: 30.4 pg (ref 26.0–34.0)
MCHC: 34.3 g/dL (ref 30.0–36.0)
MCV: 88.7 fL (ref 80.0–100.0)
Monocytes Absolute: 0.2 10*3/uL (ref 0.1–1.0)
Monocytes Relative: 6 %
Neutro Abs: 2.9 10*3/uL (ref 1.7–7.7)
Neutrophils Relative %: 74 %
Platelets: 47 10*3/uL — ABNORMAL LOW (ref 150–400)
RBC: 3.91 MIL/uL — ABNORMAL LOW (ref 4.22–5.81)
RDW: 16.8 % — ABNORMAL HIGH (ref 11.5–15.5)
WBC: 4 10*3/uL (ref 4.0–10.5)
nRBC: 0 % (ref 0.0–0.2)

## 2021-03-05 LAB — HEPATITIS PANEL, ACUTE
HCV Ab: NONREACTIVE
Hep A IgM: NONREACTIVE
Hep B C IgM: NONREACTIVE
Hepatitis B Surface Ag: NONREACTIVE

## 2021-03-05 LAB — T-HELPER CELLS (CD4) COUNT (NOT AT ARMC)
CD4 % Helper T Cell: 19 % — ABNORMAL LOW (ref 33–65)
CD4 T Cell Abs: <35 /uL — ABNORMAL LOW (ref 400–1790)

## 2021-03-05 LAB — RAPID URINE DRUG SCREEN, HOSP PERFORMED
Amphetamines: POSITIVE — AB
Barbiturates: NOT DETECTED
Benzodiazepines: NOT DETECTED
Cocaine: NOT DETECTED
Opiates: POSITIVE — AB
Tetrahydrocannabinol: NOT DETECTED

## 2021-03-05 LAB — LACTIC ACID, PLASMA: Lactic Acid, Venous: 3.4 mmol/L (ref 0.5–1.9)

## 2021-03-05 LAB — MRSA NEXT GEN BY PCR, NASAL: MRSA by PCR Next Gen: NOT DETECTED

## 2021-03-05 MED ORDER — SODIUM CHLORIDE 0.9% FLUSH: 10.0000 mL | INTRAVENOUS | Status: DC | PRN

## 2021-03-05 MED ORDER — DEXTROSE 50 % IV SOLN
INTRAVENOUS | Status: AC
  Administered 2021-03-05: 50 mL
  Filled 2021-03-05: qty 50

## 2021-03-05 MED ORDER — DEXTROSE 50 % IV SOLN
INTRAVENOUS | Status: AC
  Filled 2021-03-05: qty 50

## 2021-03-05 MED ORDER — SODIUM CHLORIDE 0.9 % IV SOLN: INTRAVENOUS | Status: DC

## 2021-03-05 MED ORDER — SULFAMETHOXAZOLE-TRIMETHOPRIM 400-80 MG PO TABS
1.0000 | ORAL_TABLET | Freq: Every day | ORAL | Status: DC
  Administered 2021-03-05: 1 via ORAL
  Filled 2021-03-05: qty 1

## 2021-03-05 MED ORDER — DEXTROSE 10 % IV SOLN: INTRAVENOUS | Status: AC

## 2021-03-05 MED ORDER — SODIUM CHLORIDE 0.9% FLUSH
10.0000 mL | Freq: Two times a day (BID) | INTRAVENOUS | Status: DC
  Administered 2021-03-05 – 2021-03-11 (×10): 10 mL

## 2021-03-05 MED ORDER — MIDODRINE HCL 5 MG PO TABS
10.0000 mg | ORAL_TABLET | ORAL | Status: AC
  Administered 2021-03-05: 10 mg via ORAL
  Filled 2021-03-05: qty 2

## 2021-03-05 MED ORDER — CALCIUM GLUCONATE-NACL 1-0.675 GM/50ML-% IV SOLN
1.0000 g | Freq: Once | INTRAVENOUS | Status: AC
  Administered 2021-03-05: 1000 mg via INTRAVENOUS
  Filled 2021-03-05: qty 50

## 2021-03-05 MED ORDER — CEFAZOLIN SODIUM-DEXTROSE 2-4 GM/100ML-% IV SOLN
2.0000 g | Freq: Two times a day (BID) | INTRAVENOUS | Status: AC
  Administered 2021-03-05 – 2021-03-07 (×5): 2 g via INTRAVENOUS
  Filled 2021-03-05 (×5): qty 100

## 2021-03-05 NOTE — Progress Notes (Signed)
PROGRESS NOTE    TUAN TIPPIN  WUJ:[id number] DOB: Sep 30, 1985 DOA: 03/03/2021 PCP: Patient, No Pcp Per (Inactive)   Brief Narrative:  35 year old male with past medical history of HIV/AIDS (CD4 < 35 09/2020), medication noncompliance, history of syphilis who presents to [facility] emergency department with complaints of nausea vomiting and back pain. Known previous infections including syphilis, shigella, coccidioidomycosis, cryptosporidium in the setting of immunodeficiency and medication noncompliance.  Of note, patient was recently hospitalized at [facility] from 7/19 until 7/25.  During that hospitalization patient was treated for sepsis secondary to pyelonephritis secondary to ESBL E. coli.  Presentation was complicated by associated acute kidney injury.  Patient was treated with 5 days of intravenous meropenem as well as intravenous fluids.  Acute kidney injury resolved, patient clinically improved.  Patient was additionally treated with fluconazole for observed thrush.  Patient was discharged home on 7/25.   Approximately 2 days prior to admission patient describes worsening back pain which is nonradiating with associated nausea and episodes of vomiting with extremely poor p.o. intake generalized weakness fevers and chills.  Patient met sepsis criteria at intake given fever tachycardia tachypnea leukopenia with presumed source of infection to the recurrence of ESBL E. coli pyelonephritis due to medication noncompliance.  Assessment & Plan:   Principal Problem:   Sepsis due to Escherichia coli (E. coli) (HCC) Active Problems:   Human immunodeficiency virus (HIV) disease (HCC)   Hyponatremia   Acute kidney injury ([place])   Lactic acidosis   GERD (gastroesophageal reflux disease)   Pressure injury of skin   Group A streptococcal infection   Sepsis due to group A Streptococcus without acute organ dysfunction (HCC)   Sepsis due to Group A strep bacteremia Unlikely  ongoing ESBL Escherichia coli (E. coli) (HCC) - History of recent ESBL E. coli pyelonephritis - Cultures remarkable for Group A Strep - ID consulted - De-escalate to cefazolin/linezolid through 8/14 - then transition to cefadroxil 1g IBD for 10 day course(total) - stop date 03/14/21 - Surgery signing off given no clear abdominal source and no need for ongoing procedure  AKI, prerenal in the setting of poor p.o. intake -Continue NS/D5W  Profound lactic acidosis -Likely secondary to sepsis as above, continue IV fluids and follow-up for sources of infection -Downtrending appropriately   AIDS/Human immunodeficiency virus (HIV) disease ([place]) Recurrent profound medication noncompliance -Continue home HAART regimen Lab Results  Component Value Date   HIV1RNAQUANT 449,000 (H) 08/25/2020   Lab Results  Component Value Date   CD4TCELL 19 (L) 03/04/2021   CD4TABS <35 (L) 03/04/2021  -ID following - appreciate insight/recs  Hypoglycemia, profound, resolved -Patient does not carry diabetes diagnosis per chart review -A1c 5.9 -Continue D5W until PO intake improves -Advance diet as tolerated  Hypovolemic hyponatremia -In the setting of above poor p.o. intake -Continue IV fluids    Substance abuse  - UDS previously positive for amphetamines and THC  - Counseled toward cessation    DVT prophylaxis: Lovenox Code Status: Full Family Communication: None available  Status is: Inpatient  Dispo: The patient is from: Home              Anticipated d/c is to: To be determined              Anticipated d/c date is: >72h              Patient currently not medically stable for discharge  Consultants:  General surgery  Procedures:  None planned  Antimicrobials:  Meropenem  Subjective: No acute issues or events this morning,more awake and alert today - will advance diet as tolerated.   Objective: Vitals:   03/05/21 0921 03/05/21 1102 03/05/21 1200 03/05/21 1320  BP: (!) 90/59    93/62  Pulse:      Resp: '17 19 19 20  ' Temp:      TempSrc:      SpO2:    98%  Weight:        Intake/Output Summary (Last 24 hours) at 03/05/2021 1708 Last data filed at 03/05/2021 0500 Gross per 24 hour  Intake --  Output 350 ml  Net -350 ml   Filed Weights   03/03/21 2050  Weight: 79.4 kg    Examination:  General: Resting in bed, mildly tremulous but no acute distress HEENT:  Normocephalic atraumatic.  Sclerae nonicteric, noninjected.  Extraocular movements intact bilaterally. Neck:  Without mass or deformity.  Trachea is midline. Lungs:  Clear to auscultate bilaterally without rhonchi, wheeze, or rales. Heart:  Regular rate and rhythm.  Without murmurs, rubs, or gallops. Abdomen:  Soft, nontender, nondistended.  Without guarding or rebound. Extremities: Without cyanosis, clubbing, edema, or obvious deformity. Vascular:  Dorsalis pedis and posterior tibial pulses palpable bilaterally. Skin:  Warm and dry, no erythema, no ulcerations.   Data Reviewed: I have personally reviewed following labs and imaging studies  CBC: Recent Labs  Lab 03/03/21 1901 03/04/21 1133 03/04/21 2220 03/05/21 0255  WBC 2.2* 2.2* 2.9* 4.0  NEUTROABS 1.7 0.4* 2.2 2.9  HGB 14.4 12.8* 12.5* 11.9*  HCT 43.6 37.9* 36.2* 34.7*  MCV 92.6 90.2 88.7 88.7  PLT PLATELET CLUMPS NOTED ON SMEAR, UNABLE TO ESTIMATE 40* 47* 47*   Basic Metabolic Panel: Recent Labs  Lab 03/03/21 1901 03/04/21 0553 03/04/21 2220 03/05/21 0255 03/05/21 0526  NA 126* 126* 127* 125* 127*  K 3.5 4.2 4.2 4.5  --   CL 93* 95* 93* 94*  --   CO2 14* 15* 19* 18*  --   GLUCOSE 65* 55* 73 92  --   BUN 33* 41* 54* 56*  --   CREATININE 3.18* 3.70* 5.04* 5.17*  --   CALCIUM 8.1* 7.2* 6.9* 6.8*  --    GFR: Estimated Creatinine Clearance: 22 mL/min (A) (by C-G formula based on SCr of 5.17 mg/dL (H)). Liver Function Tests: Recent Labs  Lab 03/03/21 1901 03/04/21 0553 03/04/21 2220 03/05/21 0255  AST 72* 158* 96* 84*   ALT 26 41 38 37  ALKPHOS 71 63 72 68  BILITOT 2.9* 4.6* 3.4* 3.4*  PROT 9.6* 7.6 6.4* 6.2*  ALBUMIN 2.9* 2.2* 1.8* 1.7*   Recent Labs  Lab 03/03/21 1901  LIPASE 27   No results for input(s): AMMONIA in the last 168 hours. Coagulation Profile: Recent Labs  Lab 03/04/21 0553  INR 1.6*   Cardiac Enzymes: No results for input(s): CKTOTAL, CKMB, CKMBINDEX, TROPONINI in the last 168 hours. BNP (last 3 results) No results for input(s): PROBNP in the last 8760 hours. HbA1C: Recent Labs    03/04/21 1645  HGBA1C 5.9*   CBG: Recent Labs  Lab 03/05/21 0822 03/05/21 1022 03/05/21 1123 03/05/21 1456 03/05/21 1644  GLUCAP 74 53* 124* 66* 68*   Lipid Profile: No results for input(s): CHOL, HDL, LDLCALC, TRIG, CHOLHDL, LDLDIRECT in the last 72 hours. Thyroid Function Tests: No results for input(s): TSH, T4TOTAL, FREET4, T3FREE, THYROIDAB in the last 72 hours. Anemia Panel: No results for input(s): VITAMINB12, FOLATE, FERRITIN, TIBC, IRON, RETICCTPCT in the  last 72 hours. Sepsis Labs: Recent Labs  Lab 03/04/21 0553 03/04/21 1133 03/04/21 2245 03/05/21 0255  PROCALCITON >150.00  --   --   --   LATICACIDVEN 5.5* 4.3* 4.1* 3.4*    Recent Results (from the past 240 hour(s))  Blood Culture (routine x 2)     Status: Abnormal (Preliminary result)   Collection Time: 03/03/21  8:36 PM   Specimen: Site Not Specified; Blood  Result Value Ref Range Status   Specimen Description SITE NOT SPECIFIED  Final   Special Requests   Final    BOTTLES DRAWN AEROBIC ONLY Blood Culture results may not be optimal due to an inadequate volume of blood received in culture bottles   Culture  Setup Time   Final    GRAM POSITIVE COCCI IN CHAINS AEROBIC BOTTLE ONLY CRITICAL RESULT CALLED TO, READ BACK BY AND VERIFIED WITH: PHARMD ALEX  L. [phone] FCP    Culture (A)  Final    GROUP A STREP (S.PYOGENES) ISOLATED SUSCEPTIBILITIES TO FOLLOW Performed at [facility], [place] [address],  [place], [place] [address]    Report Status PENDING  Incomplete  Blood Culture ID Panel (Reflexed)     Status: Abnormal   Collection Time: 03/03/21  8:36 PM  Result Value Ref Range Status   Enterococcus faecalis NOT DETECTED NOT DETECTED Final   Enterococcus Faecium NOT DETECTED NOT DETECTED Final   Listeria monocytogenes NOT DETECTED NOT DETECTED Final   Staphylococcus species NOT DETECTED NOT DETECTED Final   Staphylococcus aureus (BCID) NOT DETECTED NOT DETECTED Final   Staphylococcus epidermidis NOT DETECTED NOT DETECTED Final   Staphylococcus lugdunensis NOT DETECTED NOT DETECTED Final   Streptococcus species DETECTED (A) NOT DETECTED Final    Comment: CRITICAL RESULT CALLED TO, READ BACK BY AND VERIFIED WITH: PHARMD ALEX  L. [phone] FCP    Streptococcus agalactiae NOT DETECTED NOT DETECTED Final   Streptococcus pneumoniae NOT DETECTED NOT DETECTED Final   Streptococcus pyogenes DETECTED (A) NOT DETECTED Final    Comment: CRITICAL RESULT CALLED TO, READ BACK BY AND VERIFIED WITH: PHARMD ALEX  L. [phone] FCP    A.calcoaceticus-baumannii NOT DETECTED NOT DETECTED Final   Bacteroides fragilis NOT DETECTED NOT DETECTED Final   Enterobacterales NOT DETECTED NOT DETECTED Final   Enterobacter cloacae complex NOT DETECTED NOT DETECTED Final   Escherichia coli NOT DETECTED NOT DETECTED Final   Klebsiella aerogenes NOT DETECTED NOT DETECTED Final   Klebsiella oxytoca NOT DETECTED NOT DETECTED Final   Klebsiella pneumoniae NOT DETECTED NOT DETECTED Final   Proteus species NOT DETECTED NOT DETECTED Final   Salmonella species NOT DETECTED NOT DETECTED Final   Serratia marcescens NOT DETECTED NOT DETECTED Final   Haemophilus influenzae NOT DETECTED NOT DETECTED Final   Neisseria meningitidis NOT DETECTED NOT DETECTED Final   Pseudomonas aeruginosa NOT DETECTED NOT DETECTED Final   Stenotrophomonas maltophilia NOT DETECTED NOT DETECTED Final   Candida albicans NOT DETECTED NOT DETECTED  Final   Candida auris NOT DETECTED NOT DETECTED Final   Candida glabrata NOT DETECTED NOT DETECTED Final   Candida krusei NOT DETECTED NOT DETECTED Final   Candida parapsilosis NOT DETECTED NOT DETECTED Final   Candida tropicalis NOT DETECTED NOT DETECTED Final   Cryptococcus neoformans/gattii NOT DETECTED NOT DETECTED Final    Comment: Performed at [facility], [address] [place], [place] [address]  Blood Culture (routine x 2)     Status: Abnormal (Preliminary result)   Collection  Time: 03/03/21  8:41 PM   Specimen: BLOOD  Result Value Ref Range Status   Specimen Description BLOOD RIGHT UPPER  Final   Special Requests   Final    BOTTLES DRAWN AEROBIC AND ANAEROBIC Blood Culture results may not be optimal due to an inadequate volume of blood received in culture bottles   Culture  Setup Time   Final    GRAM POSITIVE COCCI IN BOTH AEROBIC AND ANAEROBIC BOTTLES CRITICAL VALUE NOTED.  VALUE IS CONSISTENT WITH PREVIOUSLY REPORTED AND CALLED VALUE. Performed at [facility], [place] [address] [place], [place] [address]    Culture GROUP A STREP (S.PYOGENES) ISOLATED (A)  Final   Report Status PENDING  Incomplete  Resp Panel by RT-PCR (Flu A&B, Covid) Nasopharyngeal Swab     Status: None   Collection Time: 03/03/21 10:49 PM   Specimen: Nasopharyngeal Swab; Nasopharyngeal(NP) swabs in vial transport medium  Result Value Ref Range Status   SARS Coronavirus 2 by RT PCR NEGATIVE NEGATIVE Final    Comment: (NOTE) SARS-CoV-2 target nucleic acids are NOT DETECTED.  The SARS-CoV-2 RNA is generally detectable in upper respiratory specimens during the acute phase of infection. The lowest concentration of SARS-CoV-2 viral copies this assay can detect is 138 copies/mL. A negative result does not preclude SARS-Cov-2 infection and should not be used as the sole basis for treatment or other patient management decisions. A negative result may occur with  improper specimen  collection/handling, submission of specimen other than nasopharyngeal swab, presence of viral mutation(s) within the areas targeted by this assay, and inadequate number of viral copies(<138 copies/mL). A negative result must be combined with clinical observations, patient history, and epidemiological information. The expected result is Negative.  Fact Sheet for Patients:  EntrepreneurPulse.com.au  Fact Sheet for Healthcare Providers:  IncredibleEmployment.be  This test is no t yet approved or cleared by the [place] FDA and  has been authorized for detection and/or diagnosis of SARS-CoV-2 by FDA under an Emergency Use Authorization (EUA). This EUA will remain  in effect (meaning this test can be used) for the duration of the COVID-19 declaration under Section 564(b)(1) of the Act, 21 U.S.C.section 360bbb-3(b)(1), unless the authorization is terminated  or revoked sooner.       Influenza A by PCR NEGATIVE NEGATIVE Final   Influenza B by PCR NEGATIVE NEGATIVE Final    Comment: (NOTE) The Xpert Xpress SARS-CoV-2/FLU/RSV plus assay is intended as an aid in the diagnosis of influenza from Nasopharyngeal swab specimens and should not be used as a sole basis for treatment. Nasal washings and aspirates are unacceptable for Xpert Xpress SARS-CoV-2/FLU/RSV testing.  Fact Sheet for Patients: EntrepreneurPulse.com.au  Fact Sheet for Healthcare Providers: IncredibleEmployment.be  This test is not yet approved or cleared by the [place] FDA and has been authorized for detection and/or diagnosis of SARS-CoV-2 by FDA under an Emergency Use Authorization (EUA). This EUA will remain in effect (meaning this test can be used) for the duration of the COVID-19 declaration under Section 564(b)(1) of the Act, 21 U.S.C. section 360bbb-3(b)(1), unless the authorization is terminated or revoked.  Performed at [facility], [place] [address], [place], [place] [address]   MRSA Next Gen by PCR, Nasal     Status: None   Collection Time: 03/05/21  1:26 AM   Specimen: Nasal Mucosa; Nasal Swab  Result Value Ref Range Status   MRSA by PCR Next Gen NOT DETECTED NOT DETECTED Final    Comment: (NOTE)  The GeneXpert MRSA Assay (FDA approved for NASAL specimens only), is one component of a comprehensive MRSA colonization surveillance program. It is not intended to diagnose MRSA infection nor to guide or monitor treatment for MRSA infections. Test performance is not FDA approved in patients less than 32 years old. Performed at [facility], [place] [address] [place], [place] [address]          Radiology Studies: CT ABDOMEN PELVIS WO CONTRAST  Result Date: 03/03/2021 CLINICAL DATA:  Acute abdominal pain. Possible sepsis. History of HIV. EXAM: CT ABDOMEN AND PELVIS WITHOUT CONTRAST TECHNIQUE: Multidetector CT imaging of the abdomen and pelvis was performed following the standard protocol without IV contrast. COMPARISON:  February 11, 2021 FINDINGS: Lower chest: Right lower lobe ground-glass nodules measuring up to 6 mm, likely infectious or inflammatory. Hepatobiliary: Unremarkable noncontrast appearance of the hepatic parenchyma. Mild periportal edema. The gallbladder is dilated with an wall thickening pericholecystic fluid. No biliary ductal dilation. Pancreas: Peripancreatic fluid with unremarkable noncontrast appearance the pancreatic parenchyma. No pancreatic ductal dilation. Spleen: Within normal limits. Adrenals/Urinary Tract: Adrenal glands are unremarkable. Kidneys are normal, without renal calculi, contour deforming lesion, or hydronephrosis. Bladder is unremarkable degree of distension. Stomach/Bowel: Stomach is within normal limits. Normal appendix. No evidence of small bowel bowel wall thickening, distention, or inflammatory changes. There is rectal wall thickening with adjacent inflammatory stranding.  Vascular/Lymphatic: No abdominal aortic aneurysm. Evaluation of the abdomen pelvis for adenopathy is limited by an relative paucity of peritoneal fat and lack of IV contrast. However, there is interval increase in retroperitoneal adenopathy with the previously indexed left periaortic lymph node now measuring 17 mm on image 2/3 previously 12 mm. Reproductive: Prostate is unremarkable. Other: Walled off fluid collections. No pneumoperitoneum. No portal venous gas. Musculoskeletal: No acute or significant osseous findings. IMPRESSION: 1. Dilated gallbladder with pericholecystic fluid but without wall thickening. Findings which are equivocal for acute cholecystitis. Suggest correlation with tenderness to palpation and right upper quadrant ultrasound. 2. Peripancreatic fluid extending into the small bowel mesentery, possibly reflecting acute pancreatitis. Correlation with serum lipase suggested. 3. Rectal wall thickening with adjacent inflammatory stranding suggestive of proctitis. 4. Interval increase in retroperitoneal adenopathy, possibly reactive in this patient with reported history of HIV. 5. Right lower lobe ground-glass nodules measuring up to 6 mm, likely infectious or inflammatory. Electronically Signed   By: Dahlia Bailiff MD   On: 03/03/2021 21:55   NM Hepatobiliary Liver Func  Result Date: 03/04/2021 CLINICAL DATA:  Clinical concern for cholecystitis. EXAM: NUCLEAR MEDICINE HEPATOBILIARY IMAGING TECHNIQUE: Sequential images of the abdomen were obtained out to 60 minutes following intravenous administration of radiopharmaceutical. RADIOPHARMACEUTICALS:  7.8 mCi Tc-82m Choletec IV COMPARISON:  None. FINDINGS: Prompt uptake by radiotracer evident with clearance of blood pool activity by 10-15 minutes. Patient was unable to continue beyond 20 minutes and stopped the exam. IMPRESSION: Nondiagnostic study. Patient requested to terminate the study at 20 minutes. Electronically Signed   By: EMisty StanleyM.D.    On: 03/04/2021 16:34   DG Chest Portable 1 View  Result Date: 03/03/2021 CLINICAL DATA:  Shortness of breath, nausea, vomiting EXAM: PORTABLE CHEST 1 VIEW COMPARISON:  02/09/2021 FINDINGS: Heart and mediastinal contours are within normal limits. No focal opacities or effusions. No acute bony abnormality. IMPRESSION: No active disease. Electronically Signed   By: KRolm BaptiseM.D.   On: 03/03/2021 20:19   UKoreaAbdomen Limited RUQ (LIVER/GB)  Result Date: 03/03/2021 CLINICAL DATA:  Right upper quadrant pain EXAM:  ULTRASOUND ABDOMEN LIMITED RIGHT UPPER QUADRANT COMPARISON:  CT 03/03/2021 FINDINGS: Gallbladder: Borderline gallbladder wall distension. No significant gallbladder wall thickening. No visible gallstones. Sonographic Percell Miller sign is reportedly negative. Common bile duct: Diameter: 3.9 mm, nondilated Liver: Diffusely diminished hepatic echogenicity. No concerning focal lesion. Smooth surface contour. Portal vein is patent on color Doppler imaging with normal direction of blood flow towards the liver. Other: Unable to obtain decubitus imaging due to patient's status. IMPRESSION: Distended gallbladder without significant wall thickening or sonographic Murphy sign. Felt less likely to reflect an acute cholecystitis though if there is persisting concern, HIDA could be obtained. Diffusely decreased hepatic echogenicity. Nonspecific though can be seen with intrinsic liver disease. Recommend correlation with serology and patient history. Electronically Signed   By: Lovena Le M.D.   On: 03/03/2021 23:54    Scheduled Meds:  Darunavir-Cobicisctat-Emtricitabine-Tenofovir Alafenamide  1 tablet Oral Q breakfast   enoxaparin (LOVENOX) injection  40 mg Subcutaneous Q24H   midodrine  10 mg Oral TID WC   sodium chloride flush  10-40 mL Intracatheter Q12H   sulfamethoxazole-trimethoprim  1 tablet Oral Daily   Continuous Infusions:  sodium chloride 100 mL/hr at 03/05/21 0438    ceFAZolin (ANCEF) IV      dextrose 30 mL/hr at 03/05/21 0445   lactated ringers     linezolid (ZYVOX) IV 600 mg (03/05/21 1043)     LOS: 1 day   Time spent: 43mn  Laurabeth Yip C Kennieth Plotts, DO [facility]  If 7PM-7AM, please contact night-coverage [email or web address]  03/05/2021, 5:08 PM

## 2021-03-05 NOTE — Consult Note (Signed)
[facility]    Date of Admission:  03/03/2021     Reason for Consult: group a strep bacteremia    Referring Provider: Natale Milch   Abx: 8/10-c meropenem 8/10-c linezolid  Symtuza        Assessment: Group a strep bacteremia Lft elevation Hiv/aids Hx syphilis -- titer between 1 and 2 the last 7 years previously treated Ecoli esbl colonization/urine Vancomycin allergy  35 yo male hiv/aids, noncompliance, on symtuza, admitted 8/10 for 2 days acute onset n/v and back pain found to have group a strep bacteremia. Has recent admission 7/19-25 for severe sepsis treated for esbl ecoli pyelonephritis (right sided)  #GAS bacteremia Mrsa nares negative 8/10 bcx group a strep Patient has amoxicillin allergy listed but no penicillin allergy based on what he received in the past Unclear source (no skin source -- ?some sore throat but has been vomiting) Improving   #hx oropharyngeal candida Patient previously treated for oropharyngeal candidiasis and shouldn't need further fluconazole  #hiv/aids Known noncompliance he should continue to symtuza and prophy bactrim Lab Results  Component Value Date   CD4TCELL 3 (L) 02/10/2021   CD4TABS <35 (L) 02/10/2021   Lab Results  Component Value Date   HIV1RNAQUANT 449,000 (H) 08/25/2020    #lft elevation Suspect sepsis Nonspecific ct finding fluid but clinically doesn't appear to have cholecystitis  Plan:  Change antibiotics to cefazolin; keep linezolid for a couple more days until 8/14 for toxin suppression/eagle's effect On 8/14 also can transition cefazolin to cefadroxil 1 gram bid to finish total 10 day course abx on 8/21 Unclear source of group a strep bacteremia Can defer echo/IE workup at this time as beta-hemolytic strep is a low risk for such process, unless persistent sepsis/bacteremia Continue symtuza Continue prophylactic bactrim Stop fluconazole  7.   Patient has outpatient ID  clinic f/u with Jeanine Luz on 9/6 @ 2pm  8.   ID will sign off    ------------------------------------------------ Principal Problem:   Sepsis due to Escherichia coli (E. coli) (HCC) Active Problems:   Human immunodeficiency virus (HIV) disease (HCC)   Hyponatremia   Acute kidney injury (HCC)   Lactic acidosis   GERD (gastroesophageal reflux disease)   Pressure injury of skin    HPI: Jacob Gutierrez is a 35 y.o. male hiv/aids, recent admission for esbl ecoli pyelo, readmitted 8/10 for sepsis/n/v found to have group a strep bacteremia   He was also recently treated for candida esophagitis He was discharged and was doing better  However 2 days agao started having subjective f/c, n/v. The sorethroat he doesn't remember onset time. There was associated progressive malaise  He was readmitted 8/10, admitting vitals 100.4, normal hemodynamics but sinus tach. Initial wbc 2's. Sodium 120s. Mild lft elevation  Ct abd pel showed normal gallbladder wall size but pericystic fluid Hida scan ordered but couldn't finished.  Lft stable mildly elevated the last 2 days  Fever resolved with empiric abx Bcx group a strep  He is feeling better but still with n/v/malaise    Family History  Problem Relation Age of Onset   Hypertension Mother    Lupus Maternal Grandmother    Diabetes Maternal Grandmother    Cancer Paternal Grandfather        unknown    Prostate cancer Father    Diabetes Father    Diabetes Maternal Grandfather    Colon cancer Neg Hx    Stomach cancer Neg Hx  Esophageal cancer Neg Hx    Pancreatic cancer Neg Hx     Social History   Tobacco Use   Smoking status: Every Day    Packs/day: 0.30    Types: Cigarettes   Smokeless tobacco: Never   Tobacco comments:    cutting back  Vaping Use   Vaping Use: Never used  Substance Use Topics   Alcohol use: Not Currently    Alcohol/week: 1.0 standard drink    Types: 1 Standard drinks or equivalent per week     Comment: whiskey occasionally    Drug use: Not Currently    Frequency: 1.0 times per week    Types: Marijuana    Allergies  Allergen Reactions   Vancomycin Anaphylaxis, Itching, Swelling and Other (See Comments)    Angioedema and "everything swells"   Amoxicillin Other (See Comments)    From childhood: "I had a reaction when i was little." (??) Has tolerated multiple cephalosporins in the past    Review of Systems: ROS All Other ROS was negative, except mentioned above   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 [facility])    H/O coccidioidomycosis    pulmonary    HIV disease (HCC)    Past history of allergy to penicillin-type antibiotic 07-03-2012   desensitization   PNA (pneumonia)    Shigella gastroenteritis    Syphilis    history /treated        Scheduled Meds:  Darunavir-Cobicisctat-Emtricitabine-Tenofovir Alafenamide  1 tablet Oral Q breakfast   dextrose       enoxaparin (LOVENOX) injection  40 mg Subcutaneous Q24H   fluconazole  50 mg Oral Daily   midodrine  10 mg Oral TID WC   sodium chloride flush  10-40 mL Intracatheter Q12H   Continuous Infusions:  sodium chloride 100 mL/hr at 03/05/21 0438   dextrose 30 mL/hr at 03/05/21 0445   lactated ringers     linezolid (ZYVOX) IV 600 mg (03/04/21 2305)   meropenem (MERREM) IV 1 g (03/05/21 0930)   PRN Meds:.acetaminophen **OR** acetaminophen, ondansetron **OR** ondansetron (ZOFRAN) IV, sodium chloride flush   OBJECTIVE: Blood pressure (!) 91/59, pulse (!) 148, temperature 99.1 F (37.3 C), resp. rate (!) 22, weight 79.4 kg, SpO2 97 %.  Physical Exam  General/constitutional: mild distress, pleasant HEENT: Normocephalic, PER, Conj Clear, EOMI, Oropharynx clear Neck supple CV: rrr no mrg Lungs: clear to auscultation, normal respiratory  effort Abd: Soft, Nontender Ext: no edema Skin: No Rash Neuro: nonfocal MSK: no peripheral joint swelling/tenderness/warmth; back spines nontender Psych: alert/oriented   Lab Results Lab Results  Component Value Date   WBC 4.0 03/05/2021   HGB 11.9 (L) 03/05/2021   HCT 34.7 (L) 03/05/2021   MCV 88.7 03/05/2021   PLT 47 (L) 03/05/2021    Lab Results  Component Value Date   CREATININE 5.17 (H) 03/05/2021   BUN 56 (H) 03/05/2021   NA 127 (L) 03/05/2021   K 4.5 03/05/2021   CL 94 (L) 03/05/2021   CO2 18 (L) 03/05/2021    Lab Results  Component Value Date   ALT 37 03/05/2021   AST 84 (H) 03/05/2021   ALKPHOS 68 03/05/2021   BILITOT 3.4 (H) 03/05/2021      Microbiology: Recent Results (from the past 240 hour(s))  Blood Culture (routine x 2)     Status: Abnormal (  Preliminary result)   Collection Time: 03/03/21  8:36 PM   Specimen: Site Not Specified; Blood  Result Value Ref Range Status   Specimen Description SITE NOT SPECIFIED  Final   Special Requests   Final    BOTTLES DRAWN AEROBIC ONLY Blood Culture results may not be optimal due to an inadequate volume of blood received in culture bottles   Culture  Setup Time   Final    GRAM POSITIVE COCCI IN CHAINS AEROBIC BOTTLE ONLY CRITICAL RESULT CALLED TO, READ BACK BY AND VERIFIED WITH: PHARMD ALEX  L. [phone] FCP    Culture (A)  Final    GROUP A STREP (S.PYOGENES) ISOLATED SUSCEPTIBILITIES TO FOLLOW Performed at [facility], [address] [place], [place] [address]    Report Status PENDING  Incomplete  Blood Culture ID Panel (Reflexed)     Status: Abnormal   Collection Time: 03/03/21  8:36 PM  Result Value Ref Range Status   Enterococcus faecalis NOT DETECTED NOT DETECTED Final   Enterococcus Faecium NOT DETECTED NOT DETECTED Final   Listeria monocytogenes NOT DETECTED NOT DETECTED Final   Staphylococcus species NOT DETECTED NOT DETECTED Final   Staphylococcus aureus (BCID) NOT DETECTED NOT DETECTED  Final   Staphylococcus epidermidis NOT DETECTED NOT DETECTED Final   Staphylococcus lugdunensis NOT DETECTED NOT DETECTED Final   Streptococcus species DETECTED (A) NOT DETECTED Final    Comment: CRITICAL RESULT CALLED TO, READ BACK BY AND VERIFIED WITH: PHARMD ALEX  L. [phone] FCP    Streptococcus agalactiae NOT DETECTED NOT DETECTED Final   Streptococcus pneumoniae NOT DETECTED NOT DETECTED Final   Streptococcus pyogenes DETECTED (A) NOT DETECTED Final    Comment: CRITICAL RESULT CALLED TO, READ BACK BY AND VERIFIED WITH: PHARMD ALEX  L. [phone] FCP    A.calcoaceticus-baumannii NOT DETECTED NOT DETECTED Final   Bacteroides fragilis NOT DETECTED NOT DETECTED Final   Enterobacterales NOT DETECTED NOT DETECTED Final   Enterobacter cloacae complex NOT DETECTED NOT DETECTED Final   Escherichia coli NOT DETECTED NOT DETECTED Final   Klebsiella aerogenes NOT DETECTED NOT DETECTED Final   Klebsiella oxytoca NOT DETECTED NOT DETECTED Final   Klebsiella pneumoniae NOT DETECTED NOT DETECTED Final   Proteus species NOT DETECTED NOT DETECTED Final   Salmonella species NOT DETECTED NOT DETECTED Final   Serratia marcescens NOT DETECTED NOT DETECTED Final   Haemophilus influenzae NOT DETECTED NOT DETECTED Final   Neisseria meningitidis NOT DETECTED NOT DETECTED Final   Pseudomonas aeruginosa NOT DETECTED NOT DETECTED Final   Stenotrophomonas maltophilia NOT DETECTED NOT DETECTED Final   Candida albicans NOT DETECTED NOT DETECTED Final   Candida auris NOT DETECTED NOT DETECTED Final   Candida glabrata NOT DETECTED NOT DETECTED Final   Candida krusei NOT DETECTED NOT DETECTED Final   Candida parapsilosis NOT DETECTED NOT DETECTED Final   Candida tropicalis NOT DETECTED NOT DETECTED Final   Cryptococcus neoformans/gattii NOT DETECTED NOT DETECTED Final    Comment: Performed at [facility], [address], [place], [place] [address]  Blood Culture (routine x 2)     Status: Abnormal  (Preliminary result)   Collection Time: 03/03/21  8:41 PM   Specimen: BLOOD  Result Value Ref Range Status   Specimen Description BLOOD RIGHT UPPER  Final   Special Requests   Final    BOTTLES DRAWN AEROBIC AND ANAEROBIC Blood Culture results may not be optimal due to an inadequate volume of blood received in culture bottles  Culture  Setup Time   Final    GRAM POSITIVE COCCI IN BOTH AEROBIC AND ANAEROBIC BOTTLES CRITICAL VALUE NOTED.  VALUE IS CONSISTENT WITH PREVIOUSLY REPORTED AND CALLED VALUE. Performed at [facility], [address] [place], [place] [address]    Culture GROUP A STREP (S.PYOGENES) ISOLATED (A)  Final   Report Status PENDING  Incomplete  Resp Panel by RT-PCR (Flu A&B, Covid) Nasopharyngeal Swab     Status: None   Collection Time: 03/03/21 10:49 PM   Specimen: Nasopharyngeal Swab; Nasopharyngeal(NP) swabs in vial transport medium  Result Value Ref Range Status   SARS Coronavirus 2 by RT PCR NEGATIVE NEGATIVE Final    Comment: (NOTE) SARS-CoV-2 target nucleic acids are NOT DETECTED.  The SARS-CoV-2 RNA is generally detectable in upper respiratory specimens during the acute phase of infection. The lowest concentration of SARS-CoV-2 viral copies this assay can detect is 138 copies/mL. A negative result does not preclude SARS-Cov-2 infection and should not be used as the sole basis for treatment or other patient management decisions. A negative result may occur with  improper specimen collection/handling, submission of specimen other than nasopharyngeal swab, presence of viral mutation(s) within the areas targeted by this assay, and inadequate number of viral copies(<138 copies/mL). A negative result must be combined with clinical observations, patient history, and epidemiological information. The expected result is Negative.  Fact Sheet for Patients:  BloggerCourse.comhttps://www.fda.gov/media/152166/download  Fact Sheet for Healthcare Providers:   SeriousBroker.ithttps://www.fda.gov/media/152162/download  This test is no t yet approved or cleared by the [place] FDA and  has been authorized for detection and/or diagnosis of SARS-CoV-2 by FDA under an Emergency Use Authorization (EUA). This EUA will remain  in effect (meaning this test can be used) for the duration of the COVID-19 declaration under Section 564(b)(1) of the Act, 21 U.S.C.section 360bbb-3(b)(1), unless the authorization is terminated  or revoked sooner.       Influenza A by PCR NEGATIVE NEGATIVE Final   Influenza B by PCR NEGATIVE NEGATIVE Final    Comment: (NOTE) The Xpert Xpress SARS-CoV-2/FLU/RSV plus assay is intended as an aid in the diagnosis of influenza from Nasopharyngeal swab specimens and should not be used as a sole basis for treatment. Nasal washings and aspirates are unacceptable for Xpert Xpress SARS-CoV-2/FLU/RSV testing.  Fact Sheet for Patients: BloggerCourse.comhttps://www.fda.gov/media/152166/download  Fact Sheet for Healthcare Providers: SeriousBroker.ithttps://www.fda.gov/media/152162/download  This test is not yet approved or cleared by the [place] FDA and has been authorized for detection and/or diagnosis of SARS-CoV-2 by FDA under an Emergency Use Authorization (EUA). This EUA will remain in effect (meaning this test can be used) for the duration of the COVID-19 declaration under Section 564(b)(1) of the Act, 21 U.S.C. section 360bbb-3(b)(1), unless the authorization is terminated or revoked.  Performed at [facility], [address] [place], [place] [address]   MRSA Next Gen by PCR, Nasal     Status: None   Collection Time: 03/05/21  1:26 AM   Specimen: Nasal Mucosa; Nasal Swab  Result Value Ref Range Status   MRSA by PCR Next Gen NOT DETECTED NOT DETECTED Final    Comment: (NOTE) The GeneXpert MRSA Assay (FDA approved for NASAL specimens only), is one component of a comprehensive MRSA colonization surveillance program. It is not intended to diagnose  MRSA infection nor to guide or monitor treatment for MRSA infections. Test performance is not FDA approved in patients less than 37 years old. Performed at [facility], [address], [place], [place] [address]      Serology:    Imaging: If present, new imagings (plain films, ct scans, and mri) have been personally visualized and interpreted; radiology reports have been reviewed. Decision making incorporated into the Impression / Recommendations.   8/10 abd pelv ct  1. Dilated gallbladder with pericholecystic fluid but without wall thickening. Findings which are equivocal for acute cholecystitis. Suggest correlation with tenderness to palpation and right upper quadrant ultrasound. 2. Peripancreatic fluid extending into the small bowel mesentery, possibly reflecting acute pancreatitis. Correlation with serum lipase suggested. 3. Rectal wall thickening with adjacent inflammatory stranding suggestive of proctitis. 4. Interval increase in retroperitoneal adenopathy, possibly reactive in this patient with reported history of HIV. 5. Right lower lobe ground-glass nodules measuring up to 6 mm, likely infectious or inflammatory.  Raymondo Band, MD [facility] [phone] pager    03/05/2021, 10:34 AM

## 2021-03-05 NOTE — Progress Notes (Signed)
PIV consult: Pt with infiltrations and several painful areas in BUE from previous venipuncture. Recommend central line if prolonged infusions are needed. Noted acute kidney injury. Midline placed in LUE for immediate needs as patient is having hypoglycemic and hypotensive episodes.

## 2021-03-05 NOTE — Progress Notes (Signed)
Received a call from bedside RN regarding recurrent hypoglycemia on D5 LR at 100 cc/h.  Noted concurrent hyponatremia with serum sodium of 125 from 127.  Switched IV fluid to normal saline at 100 cc/h x 2 days, added D10 at 30 cc/h x 1 day.    Continue CBG every 2 hours until blood sugars are stable.  We will continue to closely monitor and treat as indicated.

## 2021-03-05 NOTE — Progress Notes (Signed)
   03/04/21 2045  Assess: MEWS Score  Temp 99.4 F (37.4 C)  BP (!) 91/41  ECG Heart Rate (!) 145  Resp 19  SpO2 100 %  O2 Device Room Air  Assess: MEWS Score  MEWS Temp 0  MEWS Systolic 1  MEWS Pulse 3  MEWS RR 0  MEWS LOC 0  MEWS Score 4  MEWS Score Color Red  Assess: if the MEWS score is Yellow or Red  Were vital signs taken at a resting state? Yes  Focused Assessment No change from prior assessment  Early Detection of Sepsis Score *See Row Information* High  MEWS guidelines implemented *See Row Information* Yes  Treat  MEWS Interventions Other (Comment)  Take Vital Signs  Increase Vital Sign Frequency  Red: Q 1hr X 4 then Q 4hr X 4, if remains red, continue Q 4hrs  Notify: Charge Nurse/RN  Name of Charge Nurse/RN Notified Donantonio  Date Charge Nurse/RN Notified 03/04/21  Time Charge Nurse/RN Notified 2045  Notify: Provider  Provider Name/Title Dr Margo Aye  Date Provider Notified 03/04/21  Notification Type Page  Notification Reason Other (Comment)  Provider response Evaluate remotely  Date of Provider Response 03/04/21  Notify: Rapid Response  Name of Rapid Response RN Notified Mindy  Document  Patient Outcome Not stable and remains on department

## 2021-03-05 NOTE — Progress Notes (Signed)
Subjective: C/o pain at IV infiltration site.  Denies much abdominal symptoms.  Eating ice.  Refused HIDA after 20 minutes yesterday.  No vomiting  ROS: See above, otherwise other systems negative  Objective: Vital signs in last 24 hours: Temp:  [97.1 F (36.2 C)-99.4 F (37.4 C)] 99.1 F (37.3 C) (08/12 0405) Pulse Rate:  [35-152] 148 (08/11 1828) Resp:  [16-33] 22 (08/12 0600) BP: (79-171)/(41-141) 91/59 (08/12 0600) SpO2:  [93 %-100 %] 97 % (08/12 0405)    Intake/Output from previous day: 08/11 0701 - 08/12 0700 In: -  Out: 350 [Urine:350] Intake/Output this shift: No intake/output data recorded.  PE: Abd: soft, not really tender today, +BS, ND  Lab Results:  Recent Labs    03/04/21 2220 03/05/21 0255  WBC 2.9* 4.0  HGB 12.5* 11.9*  HCT 36.2* 34.7*  PLT 47* 47*   BMET Recent Labs    03/04/21 2220 03/05/21 0255 03/05/21 0526  NA 127* 125* 127*  K 4.2 4.5  --   CL 93* 94*  --   CO2 19* 18*  --   GLUCOSE 73 92  --   BUN 54* 56*  --   CREATININE 5.04* 5.17*  --   CALCIUM 6.9* 6.8*  --    PT/INR Recent Labs    03/04/21 0553  LABPROT 18.8*  INR 1.6*   CMP     Component Value Date/Time   NA 127 (L) 03/05/2021 0526   NA 139 10/08/2020 1409   NA 141 09/27/2011 0740   K 4.5 03/05/2021 0255   K 3.2 (L) 09/27/2011 0740   CL 94 (L) 03/05/2021 0255   CL 104 09/27/2011 0740   CO2 18 (L) 03/05/2021 0255   CO2 28 09/27/2011 0740   GLUCOSE 92 03/05/2021 0255   GLUCOSE 99 09/27/2011 0740   BUN 56 (H) 03/05/2021 0255   BUN 10 10/08/2020 1409   BUN 12 09/27/2011 0740   CREATININE 5.17 (H) 03/05/2021 0255   CREATININE 1.05 08/25/2020 1008   CALCIUM 6.8 (L) 03/05/2021 0255   CALCIUM 8.1 (L) 09/27/2011 0740   PROT 6.2 (L) 03/05/2021 0255   PROT 7.9 10/08/2020 1409   PROT 8.7 (H) 09/27/2011 0040   ALBUMIN 1.7 (L) 03/05/2021 0255   ALBUMIN 3.1 (L) 10/08/2020 1409   ALBUMIN 3.3 (L) 09/27/2011 0040   AST 84 (H) 03/05/2021 0255   AST 32  09/27/2011 0040   ALT 37 03/05/2021 0255   ALT 20 09/27/2011 0040   ALKPHOS 68 03/05/2021 0255   ALKPHOS 52 09/27/2011 0040   BILITOT 3.4 (H) 03/05/2021 0255   BILITOT 0.3 10/08/2020 1409   BILITOT 0.7 09/27/2011 0040   GFRNONAA 14 (L) 03/05/2021 0255   GFRNONAA 90 08/25/2020 1008   GFRAA 105 08/25/2020 1008   Lipase     Component Value Date/Time   LIPASE 27 03/03/2021 1901   LIPASE 162 09/27/2011 0040       Studies/Results: CT ABDOMEN PELVIS WO CONTRAST  Result Date: 03/03/2021 CLINICAL DATA:  Acute abdominal pain. Possible sepsis. History of HIV. EXAM: CT ABDOMEN AND PELVIS WITHOUT CONTRAST TECHNIQUE: Multidetector CT imaging of the abdomen and pelvis was performed following the standard protocol without IV contrast. COMPARISON:  February 11, 2021 FINDINGS: Lower chest: Right lower lobe ground-glass nodules measuring up to 6 mm, likely infectious or inflammatory. Hepatobiliary: Unremarkable noncontrast appearance of the hepatic parenchyma. Mild periportal edema. The gallbladder is dilated with an wall thickening pericholecystic fluid. No biliary ductal dilation.  Pancreas: Peripancreatic fluid with unremarkable noncontrast appearance the pancreatic parenchyma. No pancreatic ductal dilation. Spleen: Within normal limits. Adrenals/Urinary Tract: Adrenal glands are unremarkable. Kidneys are normal, without renal calculi, contour deforming lesion, or hydronephrosis. Bladder is unremarkable degree of distension. Stomach/Bowel: Stomach is within normal limits. Normal appendix. No evidence of small bowel bowel wall thickening, distention, or inflammatory changes. There is rectal wall thickening with adjacent inflammatory stranding. Vascular/Lymphatic: No abdominal aortic aneurysm. Evaluation of the abdomen pelvis for adenopathy is limited by an relative paucity of peritoneal fat and lack of IV contrast. However, there is interval increase in retroperitoneal adenopathy with the previously indexed left  periaortic lymph node now measuring 17 mm on image 2/3 previously 12 mm. Reproductive: Prostate is unremarkable. Other: Walled off fluid collections. No pneumoperitoneum. No portal venous gas. Musculoskeletal: No acute or significant osseous findings. IMPRESSION: 1. Dilated gallbladder with pericholecystic fluid but without wall thickening. Findings which are equivocal for acute cholecystitis. Suggest correlation with tenderness to palpation and right upper quadrant ultrasound. 2. Peripancreatic fluid extending into the small bowel mesentery, possibly reflecting acute pancreatitis. Correlation with serum lipase suggested. 3. Rectal wall thickening with adjacent inflammatory stranding suggestive of proctitis. 4. Interval increase in retroperitoneal adenopathy, possibly reactive in this patient with reported history of HIV. 5. Right lower lobe ground-glass nodules measuring up to 6 mm, likely infectious or inflammatory. Electronically Signed   By: Maudry Mayhew MD   On: 03/03/2021 21:55   NM Hepatobiliary Liver Func  Result Date: 03/04/2021 CLINICAL DATA:  Clinical concern for cholecystitis. EXAM: NUCLEAR MEDICINE HEPATOBILIARY IMAGING TECHNIQUE: Sequential images of the abdomen were obtained out to 60 minutes following intravenous administration of radiopharmaceutical. RADIOPHARMACEUTICALS:  7.8 mCi Tc-55m  Choletec IV COMPARISON:  None. FINDINGS: Prompt uptake by radiotracer evident with clearance of blood pool activity by 10-15 minutes. Patient was unable to continue beyond 20 minutes and stopped the exam. IMPRESSION: Nondiagnostic study. Patient requested to terminate the study at 20 minutes. Electronically Signed   By: Kennith Center M.D.   On: 03/04/2021 16:34   DG Chest Portable 1 View  Result Date: 03/03/2021 CLINICAL DATA:  Shortness of breath, nausea, vomiting EXAM: PORTABLE CHEST 1 VIEW COMPARISON:  02/09/2021 FINDINGS: Heart and mediastinal contours are within normal limits. No focal opacities or  effusions. No acute bony abnormality. IMPRESSION: No active disease. Electronically Signed   By: Charlett Nose M.D.   On: 03/03/2021 20:19   US Abdomen Limited RUQ (LIVER/GB)  Result Date: 03/03/2021 CLINICAL DATA:  Right upper quadrant pain EXAM: ULTRASOUND ABDOMEN LIMITED RIGHT UPPER QUADRANT COMPARISON:  CT 03/03/2021 FINDINGS: Gallbladder: Borderline gallbladder wall distension. No significant gallbladder wall thickening. No visible gallstones. Sonographic Eulah Pont sign is reportedly negative. Common bile duct: Diameter: 3.9 mm, nondilated Liver: Diffusely diminished hepatic echogenicity. No concerning focal lesion. Smooth surface contour. Portal vein is patent on color Doppler imaging with normal direction of blood flow towards the liver. Other: Unable to obtain decubitus imaging due to patient's status. IMPRESSION: Distended gallbladder without significant wall thickening or sonographic Murphy sign. Felt less likely to reflect an acute cholecystitis though if there is persisting concern, HIDA could be obtained. Diffusely decreased hepatic echogenicity. Nonspecific though can be seen with intrinsic liver disease. Recommend correlation with serology and patient history. Electronically Signed   By: Kreg Shropshire M.D.   On: 03/03/2021 23:54    Anti-infectives: Anti-infectives (From admission, onward)    Start     Dose/Rate Route Frequency Ordered Stop   03/05/21 1000  fluconazole (  DIFLUCAN) tablet 50 mg        50 mg Oral Daily 03/04/21 2152     03/04/21 2245  fluconazole (DIFLUCAN) tablet 100 mg        100 mg Oral  Once 03/04/21 2152 03/04/21 2213   03/04/21 2230  linezolid (ZYVOX) IVPB 600 mg        600 mg 300 mL/hr over 60 Minutes Intravenous Every 12 hours 03/04/21 2141     03/04/21 2230  meropenem (MERREM) 1 g in sodium chloride 0.9 % 100 mL IVPB        1 g 200 mL/hr over 30 Minutes Intravenous Every 12 hours 03/04/21 2141     03/04/21 1230  metroNIDAZOLE (FLAGYL) IVPB 500 mg  Status:   Discontinued        500 mg 100 mL/hr over 60 Minutes Intravenous Every 12 hours 03/04/21 1134 03/04/21 2141   03/04/21 1200  cefTRIAXone (ROCEPHIN) 2 g in sodium chloride 0.9 % 100 mL IVPB  Status:  Discontinued        2 g 200 mL/hr over 30 Minutes Intravenous Every 24 hours 03/04/21 1134 03/04/21 2141   03/04/21 0800  Darunavir-Cobicisctat-Emtricitabine-Tenofovir Alafenamide (SYMTUZA) 800-150-200-10 MG TABS 1 tablet        1 tablet Oral Daily with breakfast 03/04/21 0348     03/03/21 2130  meropenem (MERREM) 1 g in sodium chloride 0.9 % 100 mL IVPB  Status:  Discontinued        1 g 200 mL/hr over 30 Minutes Intravenous Every 12 hours 03/03/21 2040 03/04/21 1134        Assessment/Plan Dilated GB  - US findings of no gallbladder wall thickening, no dilation of CBD - do not think clinical picture consistent with acute cholecystitis as the source of his sepsis at this time. -refused HIDA scan, but seems to not really have tenderness today on exam -highly unlikely his gallbladder is the source of his presentation.  Suspect persistent infectious from noncompliance of tx of his recent ESBL UTI, etc - LFTs down trending -no surgical plans.  D/w primary service.  We will sign off at this time.  No surgical follow up indicated at this time given her has no gallstones, etc   FEN: per primary ID: [email or web address], meropenem VTE: [email or web address]   Sepsis AKI Lactic acidosis Aids with poor compliance Substance abuse HIV/AIDS H/O ESBL UTI  LOS: 1 day    Letha Cape , [facility] 03/05/2021, 9:04 AM Please see Amion for pager number during day hours 7:00am-4:30pm or 7:00am -11:30am on weekends

## 2021-03-06 ENCOUNTER — Encounter (HOSPITAL_COMMUNITY): Payer: Self-pay | Admitting: Internal Medicine

## 2021-03-06 ENCOUNTER — Inpatient Hospital Stay (HOSPITAL_COMMUNITY): Payer: Medicaid Other

## 2021-03-06 LAB — GLUCOSE, CAPILLARY
Glucose-Capillary: 105 mg/dL — ABNORMAL HIGH (ref 70–99)
Glucose-Capillary: 65 mg/dL — ABNORMAL LOW (ref 70–99)
Glucose-Capillary: 68 mg/dL — ABNORMAL LOW (ref 70–99)
Glucose-Capillary: 82 mg/dL (ref 70–99)
Glucose-Capillary: 84 mg/dL (ref 70–99)

## 2021-03-06 LAB — CBC WITH DIFFERENTIAL/PLATELET
Abs Immature Granulocytes: 0.3 10*3/uL — ABNORMAL HIGH (ref 0.00–0.07)
Basophils Absolute: 0 10*3/uL (ref 0.0–0.1)
Basophils Relative: 1 %
Eosinophils Absolute: 0.6 10*3/uL — ABNORMAL HIGH (ref 0.0–0.5)
Eosinophils Relative: 14 %
HCT: 30 % — ABNORMAL LOW (ref 39.0–52.0)
Hemoglobin: 10.2 g/dL — ABNORMAL LOW (ref 13.0–17.0)
Immature Granulocytes: 8 %
Lymphocytes Relative: 5 %
Lymphs Abs: 0.2 10*3/uL — ABNORMAL LOW (ref 0.7–4.0)
MCH: 30.2 pg (ref 26.0–34.0)
MCHC: 34 g/dL (ref 30.0–36.0)
MCV: 88.8 fL (ref 80.0–100.0)
Monocytes Absolute: 0.2 10*3/uL (ref 0.1–1.0)
Monocytes Relative: 4 %
Neutro Abs: 2.7 10*3/uL (ref 1.7–7.7)
Neutrophils Relative %: 68 %
Platelets: 46 10*3/uL — ABNORMAL LOW (ref 150–400)
RBC: 3.38 MIL/uL — ABNORMAL LOW (ref 4.22–5.81)
RDW: 16.9 % — ABNORMAL HIGH (ref 11.5–15.5)
WBC Morphology: INCREASED
WBC: 4 10*3/uL (ref 4.0–10.5)
nRBC: 0 % (ref 0.0–0.2)

## 2021-03-06 LAB — COMPREHENSIVE METABOLIC PANEL
ALT: 28 U/L (ref 0–44)
AST: 45 U/L — ABNORMAL HIGH (ref 15–41)
Albumin: 1.6 g/dL — ABNORMAL LOW (ref 3.5–5.0)
Alkaline Phosphatase: 70 U/L (ref 38–126)
Anion gap: 13 (ref 5–15)
BUN: 70 mg/dL — ABNORMAL HIGH (ref 6–20)
CO2: 18 mmol/L — ABNORMAL LOW (ref 22–32)
Calcium: 6.8 mg/dL — ABNORMAL LOW (ref 8.9–10.3)
Chloride: 93 mmol/L — ABNORMAL LOW (ref 98–111)
Creatinine, Ser: 6.24 mg/dL — ABNORMAL HIGH (ref 0.61–1.24)
GFR, Estimated: 11 mL/min — ABNORMAL LOW (ref >60)
Glucose, Bld: 119 mg/dL — ABNORMAL HIGH (ref 70–99)
Potassium: 4.2 mmol/L (ref 3.5–5.1)
Sodium: 124 mmol/L — ABNORMAL LOW (ref 135–145)
Total Bilirubin: 1.9 mg/dL — ABNORMAL HIGH (ref 0.3–1.2)
Total Protein: 5.9 g/dL — ABNORMAL LOW (ref 6.5–8.1)

## 2021-03-06 LAB — HIV-1 RNA QUANT-NO REFLEX-BLD
HIV 1 RNA Quant: 454000 copies/mL
LOG10 HIV-1 RNA: 5.657 log10copy/mL

## 2021-03-06 LAB — CULTURE, BLOOD (ROUTINE X 2)

## 2021-03-06 MED ORDER — SODIUM BICARBONATE 8.4 % IV SOLN
INTRAVENOUS | Status: DC
  Filled 2021-03-06 (×3): qty 1000

## 2021-03-06 MED ORDER — SODIUM CHLORIDE 0.9 % IV BOLUS: 1500.0000 mL | Freq: Once | INTRAVENOUS | Status: DC

## 2021-03-06 MED ORDER — CEFADROXIL 1 G PO TABS: 1.0000 g | ORAL_TABLET | Freq: Two times a day (BID) | ORAL | Status: DC

## 2021-03-06 MED ORDER — SULFAMETHOXAZOLE-TRIMETHOPRIM 400-80 MG PO TABS
1.0000 | ORAL_TABLET | ORAL | Status: DC
  Administered 2021-03-08 – 2021-03-10 (×2): 1 via ORAL
  Filled 2021-03-06 (×2): qty 1

## 2021-03-06 MED ORDER — DEXTROSE 10 % IV SOLN: INTRAVENOUS | Status: DC

## 2021-03-06 MED ORDER — LIP MEDEX EX OINT
TOPICAL_OINTMENT | CUTANEOUS | Status: DC | PRN
  Filled 2021-03-06: qty 7

## 2021-03-06 NOTE — Progress Notes (Signed)
PROGRESS NOTE    Jacob Gutierrez  WUJ:[id number] DOB: Jul 06, 1986 DOA: 03/03/2021 PCP: Patient, No Pcp Per (Inactive)   Brief Narrative:  35 year old male with past medical history of HIV/AIDS (CD4 < 35 09/2020), medication noncompliance, history of syphilis who presents to [facility] department with complaints of nausea vomiting and worsening back pain starting approximately 2 days prior to admission. Known previous infections including syphilis, shigella, coccidioidomycosis, cryptosporidium in the setting of immunodeficiency and medication noncompliance.   Assessment & Plan:   Principal Problem:   Sepsis due to Escherichia coli (E. coli) (HCC) Active Problems:   Human immunodeficiency virus (HIV) disease (HCC)   Hyponatremia   Acute kidney injury (HCC)   Lactic acidosis   GERD (gastroesophageal reflux disease)   Pressure injury of skin   Group A streptococcal infection   Sepsis due to group A Streptococcus without acute organ dysfunction (HCC)   Sepsis due to Group A strep bacteremia Unlikely ongoing ESBL Escherichia coli (E. coli) (HCC) - History of recent ESBL E. coli pyelonephritis - Cultures remarkable for Group A Strep - ID consulted - De-escalate to cefazolin/linezolid through 8/14 - then transition to cefadroxil 1g IBD for 10 day course(total) - stop date 03/14/21 - Surgery signing off given no clear abdominal source and no need for ongoing procedure  AKI, worsening Likely prerenal in the setting of poor p.o. intake -Despite IV fluids patient has very minimal urine output- -Bladder scan shows minimal 300 cc, patient denies any urinary obstructive history, Foley will be placed this morning to ensure no obstructive issues -Nephrology consulted, concern that given patient's hypotension at intake he may need further evaluation, electrolytes stable for now, likely no indication for acute urgent or emergent dialysis currently but worrisome progression despite  appropriate IV fluids  Profound lactic acidosis -Likely secondary to sepsis as above, continue IV fluids and follow-up for sources of infection -Downtrending appropriately with IV fluids as above   AIDS/Human immunodeficiency virus (HIV) disease (HCC) Recurrent profound medication noncompliance -Continue home HAART regimen Lab Results  Component Value Date   HIV1RNAQUANT 449,000 (H) 08/25/2020   Lab Results  Component Value Date   CD4TCELL 19 (L) 03/04/2021   CD4TABS <35 (L) 03/04/2021  -ID following - appreciate insight/recs  Hypoglycemia, profound, resolving -Patient does not carry diabetes diagnosis per chart review -A1c 5.9 -Continue D5W until PO intake improves -Advance diet as tolerated  Hypovolemic hyponatremia -In the setting of above poor p.o. intake -Continue IV fluids    Substance abuse  - UDS previously positive for amphetamines and THC  - Counseled toward cessation    DVT prophylaxis: Lovenox Code Status: Full Family Communication: None available  Status is: Inpatient  Dispo: The patient is from: Home              Anticipated d/c is to: To be determined              Anticipated d/c date is: >72h              Patient currently not medically stable for discharge  Consultants:  General surgery  Procedures:  None planned  Antimicrobials:  Meropenem  Subjective: No acute issues or events overnight, patient continues to complain of diffuse back pain/aches but no flank pain or midline tenderness, otherwise denies vomiting diarrhea constipation headache fevers or chills  Objective: Vitals:   03/05/21 1102 03/05/21 1200 03/05/21 1320 03/05/21 2045  BP:   93/62 113/62  Pulse:    85  Resp: 19  19 20 17   Temp:    (!) 97.5 F (36.4 C)  TempSrc:    Oral  SpO2:   98% 98%  Weight:       No intake or output data in the 24 hours ending 03/06/21 0732  Filed Weights   03/03/21 2050  Weight: 79.4 kg    Examination:  General: No acute distress  resting comfortably HEENT:  Normocephalic atraumatic.  Sclerae nonicteric, noninjected.  Extraocular movements intact bilaterally. Neck:  Without mass or deformity.  Trachea is midline. Lungs:  Clear to auscultate bilaterally without rhonchi, wheeze, or rales. Heart:  Regular rate and rhythm.  Without murmurs, rubs, or gallops. Abdomen:  Soft, nontender, nondistended.  Without guarding or rebound. Extremities: Without cyanosis, clubbing, edema, or obvious deformity. Vascular:  Dorsalis pedis and posterior tibial pulses palpable bilaterally. Skin:  Warm and dry, no erythema, no ulcerations.   Data Reviewed: I have personally reviewed following labs and imaging studies  CBC: Recent Labs  Lab 03/03/21 1901 03/04/21 1133 03/04/21 2220 03/05/21 0255 03/06/21 0020  WBC 2.2* 2.2* 2.9* 4.0 4.0  NEUTROABS 1.7 0.4* 2.2 2.9 2.7  HGB 14.4 12.8* 12.5* 11.9* 10.2*  HCT 43.6 37.9* 36.2* 34.7* 30.0*  MCV 92.6 90.2 88.7 88.7 88.8  PLT PLATELET CLUMPS NOTED ON SMEAR, UNABLE TO ESTIMATE 40* 47* 47* 46*    Basic Metabolic Panel: Recent Labs  Lab 03/03/21 1901 03/04/21 0553 03/04/21 2220 03/05/21 0255 03/05/21 0526 03/05/21 1207 03/05/21 1824 03/06/21 0020  NA 126* 126* 127* 125* 127* 126* 124* 124*  K 3.5 4.2 4.2 4.5  --   --   --  4.2  CL 93* 95* 93* 94*  --   --   --  93*  CO2 14* 15* 19* 18*  --   --   --  18*  GLUCOSE 65* 55* 73 92  --   --   --  119*  BUN 33* 41* 54* 56*  --   --   --  70*  CREATININE 3.18* 3.70* 5.04* 5.17*  --   --   --  6.24*  CALCIUM 8.1* 7.2* 6.9* 6.8*  --   --   --  6.8*    GFR: Estimated Creatinine Clearance: 18.2 mL/min (A) (by C-G formula based on SCr of 6.24 mg/dL (H)). Liver Function Tests: Recent Labs  Lab 03/03/21 1901 03/04/21 0553 03/04/21 2220 03/05/21 0255 03/06/21 0020  AST 72* 158* 96* 84* 45*  ALT 26 41 38 37 28  ALKPHOS 71 63 72 68 70  BILITOT 2.9* 4.6* 3.4* 3.4* 1.9*  PROT 9.6* 7.6 6.4* 6.2* 5.9*  ALBUMIN 2.9* 2.2* 1.8* 1.7*  1.6*    Recent Labs  Lab 03/03/21 1901  LIPASE 27    No results for input(s): AMMONIA in the last 168 hours. Coagulation Profile: Recent Labs  Lab 03/04/21 0553  INR 1.6*    Cardiac Enzymes: No results for input(s): CKTOTAL, CKMB, CKMBINDEX, TROPONINI in the last 168 hours. BNP (last 3 results) No results for input(s): PROBNP in the last 8760 hours. HbA1C: Recent Labs    03/04/21 1645  HGBA1C 5.9*    CBG: Recent Labs  Lab 03/05/21 1644 03/05/21 1836 03/05/21 2042 03/06/21 0029 03/06/21 0630  GLUCAP 68* 113* 74 105* 84    Lipid Profile: No results for input(s): CHOL, HDL, LDLCALC, TRIG, CHOLHDL, LDLDIRECT in the last 72 hours. Thyroid Function Tests: No results for input(s): TSH, T4TOTAL, FREET4, T3FREE, THYROIDAB in the last 72 hours. Anemia Panel:  No results for input(s): VITAMINB12, FOLATE, FERRITIN, TIBC, IRON, RETICCTPCT in the last 72 hours. Sepsis Labs: Recent Labs  Lab 03/04/21 0553 03/04/21 1133 03/04/21 2245 03/05/21 0255  PROCALCITON >150.00  --   --   --   LATICACIDVEN 5.5* 4.3* 4.1* 3.4*     Recent Results (from the past 240 hour(s))  Blood Culture (routine x 2)     Status: Abnormal (Preliminary result)   Collection Time: 03/03/21  8:36 PM   Specimen: Site Not Specified; Blood  Result Value Ref Range Status   Specimen Description SITE NOT SPECIFIED  Final   Special Requests   Final    BOTTLES DRAWN AEROBIC ONLY Blood Culture results may not be optimal due to an inadequate volume of blood received in culture bottles   Culture  Setup Time   Final    GRAM POSITIVE COCCI IN CHAINS AEROBIC BOTTLE ONLY CRITICAL RESULT CALLED TO, READ BACK BY AND VERIFIED WITH: PHARMD ALEX  L. [phone] FCP    Culture (A)  Final    GROUP A STREP (S.PYOGENES) ISOLATED SUSCEPTIBILITIES TO FOLLOW Performed at [facility], [address], [place], [place] [address]    Report Status PENDING  Incomplete  Blood Culture ID Panel (Reflexed)     Status:  Abnormal   Collection Time: 03/03/21  8:36 PM  Result Value Ref Range Status   Enterococcus faecalis NOT DETECTED NOT DETECTED Final   Enterococcus Faecium NOT DETECTED NOT DETECTED Final   Listeria monocytogenes NOT DETECTED NOT DETECTED Final   Staphylococcus species NOT DETECTED NOT DETECTED Final   Staphylococcus aureus (BCID) NOT DETECTED NOT DETECTED Final   Staphylococcus epidermidis NOT DETECTED NOT DETECTED Final   Staphylococcus lugdunensis NOT DETECTED NOT DETECTED Final   Streptococcus species DETECTED (A) NOT DETECTED Final    Comment: CRITICAL RESULT CALLED TO, READ BACK BY AND VERIFIED WITH: PHARMD ALEX  L. [phone] FCP    Streptococcus agalactiae NOT DETECTED NOT DETECTED Final   Streptococcus pneumoniae NOT DETECTED NOT DETECTED Final   Streptococcus pyogenes DETECTED (A) NOT DETECTED Final    Comment: CRITICAL RESULT CALLED TO, READ BACK BY AND VERIFIED WITH: PHARMD ALEX  L. [phone] FCP    A.calcoaceticus-baumannii NOT DETECTED NOT DETECTED Final   Bacteroides fragilis NOT DETECTED NOT DETECTED Final   Enterobacterales NOT DETECTED NOT DETECTED Final   Enterobacter cloacae complex NOT DETECTED NOT DETECTED Final   Escherichia coli NOT DETECTED NOT DETECTED Final   Klebsiella aerogenes NOT DETECTED NOT DETECTED Final   Klebsiella oxytoca NOT DETECTED NOT DETECTED Final   Klebsiella pneumoniae NOT DETECTED NOT DETECTED Final   Proteus species NOT DETECTED NOT DETECTED Final   Salmonella species NOT DETECTED NOT DETECTED Final   Serratia marcescens NOT DETECTED NOT DETECTED Final   Haemophilus influenzae NOT DETECTED NOT DETECTED Final   Neisseria meningitidis NOT DETECTED NOT DETECTED Final   Pseudomonas aeruginosa NOT DETECTED NOT DETECTED Final   Stenotrophomonas maltophilia NOT DETECTED NOT DETECTED Final   Candida albicans NOT DETECTED NOT DETECTED Final   Candida auris NOT DETECTED NOT DETECTED Final   Candida glabrata NOT DETECTED NOT DETECTED Final    Candida krusei NOT DETECTED NOT DETECTED Final   Candida parapsilosis NOT DETECTED NOT DETECTED Final   Candida tropicalis NOT DETECTED NOT DETECTED Final   Cryptococcus neoformans/gattii NOT DETECTED NOT DETECTED Final    Comment: Performed at [facility], [address], [place], [place] [address]  Blood Culture (routine  x 2)     Status: Abnormal (Preliminary result)   Collection Time: 03/03/21  8:41 PM   Specimen: BLOOD  Result Value Ref Range Status   Specimen Description BLOOD RIGHT UPPER  Final   Special Requests   Final    BOTTLES DRAWN AEROBIC AND ANAEROBIC Blood Culture results may not be optimal due to an inadequate volume of blood received in culture bottles   Culture  Setup Time   Final    GRAM POSITIVE COCCI IN BOTH AEROBIC AND ANAEROBIC BOTTLES CRITICAL VALUE NOTED.  VALUE IS CONSISTENT WITH PREVIOUSLY REPORTED AND CALLED VALUE. Performed at [facility], [address], [place], [place] [address]    Culture GROUP A STREP (S.PYOGENES) ISOLATED (A)  Final   Report Status PENDING  Incomplete  Resp Panel by RT-PCR (Flu A&B, Covid) Nasopharyngeal Swab     Status: None   Collection Time: 03/03/21 10:49 PM   Specimen: Nasopharyngeal Swab; Nasopharyngeal(NP) swabs in vial transport medium  Result Value Ref Range Status   SARS Coronavirus 2 by RT PCR NEGATIVE NEGATIVE Final    Comment: (NOTE) SARS-CoV-2 target nucleic acids are NOT DETECTED.  The SARS-CoV-2 RNA is generally detectable in upper respiratory specimens during the acute phase of infection. The lowest concentration of SARS-CoV-2 viral copies this assay can detect is 138 copies/mL. A negative result does not preclude SARS-Cov-2 infection and should not be used as the sole basis for treatment or other patient management decisions. A negative result may occur with  improper specimen collection/handling, submission of specimen other than nasopharyngeal swab, presence of viral mutation(s) within  the areas targeted by this assay, and inadequate number of viral copies(<138 copies/mL). A negative result must be combined with clinical observations, patient history, and epidemiological information. The expected result is Negative.  Fact Sheet for Patients:  BloggerCourse.com  Fact Sheet for Healthcare Providers:  SeriousBroker.it  This test is no t yet approved or cleared by the [place] FDA and  has been authorized for detection and/or diagnosis of SARS-CoV-2 by FDA under an Emergency Use Authorization (EUA). This EUA will remain  in effect (meaning this test can be used) for the duration of the COVID-19 declaration under Section 564(b)(1) of the Act, 21 U.S.C.section 360bbb-3(b)(1), unless the authorization is terminated  or revoked sooner.       Influenza A by PCR NEGATIVE NEGATIVE Final   Influenza B by PCR NEGATIVE NEGATIVE Final    Comment: (NOTE) The Xpert Xpress SARS-CoV-2/FLU/RSV plus assay is intended as an aid in the diagnosis of influenza from Nasopharyngeal swab specimens and should not be used as a sole basis for treatment. Nasal washings and aspirates are unacceptable for Xpert Xpress SARS-CoV-2/FLU/RSV testing.  Fact Sheet for Patients: BloggerCourse.com  Fact Sheet for Healthcare Providers: SeriousBroker.it  This test is not yet approved or cleared by the [place] FDA and has been authorized for detection and/or diagnosis of SARS-CoV-2 by FDA under an Emergency Use Authorization (EUA). This EUA will remain in effect (meaning this test can be used) for the duration of the COVID-19 declaration under Section 564(b)(1) of the Act, 21 U.S.C. section 360bbb-3(b)(1), unless the authorization is terminated or revoked.  Performed at [facility], [address] [place], [place] [address]   MRSA Next Gen by PCR, Nasal     Status: None    Collection Time: 03/05/21  1:26 AM   Specimen: Nasal Mucosa; Nasal Swab  Result Value Ref Range Status   MRSA by  PCR Next Gen NOT DETECTED NOT DETECTED Final    Comment: (NOTE) The GeneXpert MRSA Assay (FDA approved for NASAL specimens only), is one component of a comprehensive MRSA colonization surveillance program. It is not intended to diagnose MRSA infection nor to guide or monitor treatment for MRSA infections. Test performance is not FDA approved in patients less than 71 years old. Performed at [facility], [address], [place], [place] [address]           Radiology Studies: NM Hepatobiliary Liver Func  Result Date: 03/04/2021 CLINICAL DATA:  Clinical concern for cholecystitis. EXAM: NUCLEAR MEDICINE HEPATOBILIARY IMAGING TECHNIQUE: Sequential images of the abdomen were obtained out to 60 minutes following intravenous administration of radiopharmaceutical. RADIOPHARMACEUTICALS:  7.8 mCi Tc-23m  Choletec IV COMPARISON:  None. FINDINGS: Prompt uptake by radiotracer evident with clearance of blood pool activity by 10-15 minutes. Patient was unable to continue beyond 20 minutes and stopped the exam. IMPRESSION: Nondiagnostic study. Patient requested to terminate the study at 20 minutes. Electronically Signed   By: Kennith Center M.D.   On: 03/04/2021 16:34    Scheduled Meds:  Darunavir-Cobicisctat-Emtricitabine-Tenofovir Alafenamide  1 tablet Oral Q breakfast   enoxaparin (LOVENOX) injection  40 mg Subcutaneous Q24H   midodrine  10 mg Oral TID WC   sodium chloride flush  10-40 mL Intracatheter Q12H   sulfamethoxazole-trimethoprim  1 tablet Oral Daily   Continuous Infusions:  sodium chloride 50 mL/hr at 03/05/21 1730    ceFAZolin (ANCEF) IV 2 g (03/05/21 2216)   lactated ringers     linezolid (ZYVOX) IV 600 mg (03/05/21 2248)     LOS: 2 days   Time spent:  Azucena Fallen, DO [facility]  If 7PM-7AM, please contact  night-coverage [email or web address]  03/06/2021, 7:32 AM

## 2021-03-06 NOTE — Consult Note (Signed)
Renal Service Consult Note [facility]  ASIM GERSTEN 03/06/2021 Sol Blazing, MD Requesting Physician: Dr Avon Gully  Reason for Consult: Renal failure HPI: The patient is a 35 y.o. year-old w/ hx of HIV/ AIDS, poor medication compliance, multiple related infections, recent ESBL EColi sepsis in July 2022 admitted on 03/03/21 w/ N/V and back pain for 2 days. In ED pt had SIRS response w/ severe Rudean Hitt, ^RR and fever and leukopenia. He got 2L IVF and IV abx. Lactic acid was up to 5.7.  Creat was 3.1 on admission. Pt was admitted. Fevers have improved, blood cx's grew Grp A strep 2/2.  Getting po Duricef after 48 hrs of IV Ancef and zyvox.  No IV vanc.  Creat has worsening up to 5.2 yest and 6.2 today.  Asked to see for renal failure.   Pt seen in room. No hx kidney disease.  He "peed the 1st time" since getting sick today.  Urine was dark. No dysuria.  No flank pain.    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 [facility])    H/O coccidioidomycosis    pulmonary    HIV disease ([place])    Past history of allergy to penicillin-type antibiotic 07-03-2012   desensitization   PNA (pneumonia)    Shigella gastroenteritis    Syphilis    history /treated    Past Surgical History  Past Surgical History:  Procedure Laterality Date   BIOPSY  10/16/2020   Procedure: BIOPSY;  Surgeon: Irving Copas., MD;  Location: [facility];  Service: Gastroenterology;;   BIOPSY  10/23/2020   Procedure: BIOPSY;  Surgeon: Jackquline Denmark, MD;  Location: [facility];  Service: Endoscopy;;   COLONOSCOPY WITH PROPOFOL N/A 10/23/2020   Procedure: COLONOSCOPY WITH PROPOFOL;   Surgeon: Jackquline Denmark, MD;  Location: [facility];  Service: Endoscopy;  Laterality: N/A;   ESOPHAGOGASTRODUODENOSCOPY (EGD) WITH PROPOFOL N/A 10/16/2020   Procedure: ESOPHAGOGASTRODUODENOSCOPY (EGD) WITH PROPOFOL;  Surgeon: Rush Landmark Telford Nab., MD;  Location: [facility];  Service: Gastroenterology;  Laterality: N/A;   TRANSURETHRAL RESECTION OF PROSTATE N/A 12/09/2017   Procedure: TRANSURETHRAL RESECTION OF THE PROSTATE (TURP);  Surgeon: Ardis Hughs, MD;  Location: [facility];  Service: Urology;  Laterality: N/A;   Family History  Family History  Problem Relation Age of Onset   Hypertension Mother    Lupus Maternal Grandmother    Diabetes Maternal Grandmother    Cancer Paternal Grandfather        unknown    Prostate cancer Father    Diabetes Father    Diabetes Maternal Grandfather    Colon cancer Neg Hx    Stomach cancer Neg Hx    Esophageal cancer Neg Hx    Pancreatic cancer Neg Hx    Social History  reports that he has been smoking cigarettes. He has been smoking an average of .3 packs per day. He has never used smokeless tobacco. He reports that he does not currently use alcohol after a past usage of about 1.0 standard drink per week. He reports that he does not currently use drugs after having used the following drugs: Marijuana. Frequency: 1.00 time per week. Allergies  Allergies  Allergen Reactions   Vancomycin Anaphylaxis, Itching, Swelling and Other (See Comments)    Angioedema and "everything swells"   Amoxicillin Other (See Comments)    From childhood: "I had a reaction when i was little." (??) Has tolerated multiple cephalosporins in the past   Home medications Prior to Admission medications   Medication Sig Start Date End Date Taking? Authorizing Provider  Darunavir-Cobicisctat-Emtricitabine-Tenofovir Alafenamide (SYMTUZA) 800-150-200-10 MG TABS Take 1 tablet by mouth daily with breakfast. 10/23/20  Yes Ghimire, Henreitta Leber, MD  fluconazole (DIFLUCAN) 200 MG  tablet Take 1 tablet (200 mg total) by mouth daily. 02/15/21  Yes Amin, Ankit Chirag, MD  midodrine (PROAMATINE) 10 MG tablet Take 1 tablet (10 mg total) by mouth 3 (three) times daily with meals. 02/15/21  Yes Amin, Jeanella Flattery, MD  sulfamethoxazole-trimethoprim (BACTRIM DS) 800-160 MG tablet Take 1 tablet by mouth 3 (three) times a week. Patient taking differently: Take 1 tablet by mouth daily. 02/15/21  Yes Amin, Jeanella Flattery, MD  Darunavir-Cobicisctat-Emtricitabine-Tenofovir Alafenamide (SYMTUZA) 800-150-200-10 MG TABS TAKE 1 TABLET BY MOUTH DAILY WITH BREAKFAST. Patient not taking: No sig reported 10/23/20 10/23/21  Jonetta Osgood, MD  ferrous sulfate 325 (65 FE) MG tablet TAKE 1 TABLET (325 MG TOTAL) BY MOUTH DAILY AFTER BREAKFAST. Patient not taking: No sig reported 10/23/20 10/23/21  Ghimire, Henreitta Leber, MD  lipase/protease/amylase (CREON) 36000 UNITS CPEP capsule TAKE 2 CAPSULES (72,000 UNITS) BY MOUTH WITH EVERY MEAL, TAKE 1 CAPSULE (36,000 UNITS) BY MOUTH WITH EVERY SNACK. Patient not taking: No sig reported 09/25/20 09/25/21  Levin Erp, PA  loperamide (IMODIUM A-D) 2 MG tablet Take 1 tablet (2 mg total) by mouth every 8 (eight) hours. Patient not taking: No sig reported 09/18/20   Levin Erp, PA  pantoprazole (PROTONIX) 40 MG tablet TAKE 1 TABLET (40 MG TOTAL) BY MOUTH 2 (TWO) TIMES DAILY. Patient not taking: No sig reported 10/23/20 10/23/21  Ghimire, Henreitta Leber, MD  potassium chloride (KLOR-CON) 10 MEQ tablet TAKE 2 TABLETS BY MOUTH DAILY. Patient not taking: No sig reported 10/14/20 10/14/21  Argentina Donovan, PA-C     Vitals:   03/06/21 0734 03/06/21 0900 03/06/21 1144 03/06/21 1600  BP: 117/65 (!) 102/57 100/79 114/77  Pulse: 84  69 75  Resp: _0 Temp: 97.6 F (36.4 C)  (!) 97.4 F (36.3 C) 97.6 F (36.4 C)  TempSrc: Oral  Axillary Oral  SpO2:      Weight:       Exam Gen alert, no distress, somewhat frail and a bit cachectic Not toxic in appearance,  chronically ill appearing No rash, cyanosis or gangrene Sclera anicteric, throat clear slightly dry  Flat neck veins, no JVD Chest clear bilat to bases, no rales/ wheezing RRR no MRG Abd soft ntnd no mass or ascites +bs GU normal MS no joint effusions or deformity Ext no LE or UE edema, no wounds or ulcers Neuro is alert, Ox 3 , nf, no asterixis      UA 02/11/21 - 30 prot, 21-50 wbc, 0-5 rbc   UA 03/05/21 - >300 prot, 0-5 rbc/ wbc, rare bact   CXR 8/10 -   IMPRESSION: No active disease   Renal US pending    UNa, UCr pend   Total I/O = 5.4 L in and 670 cc UOP = +4.8 L     Period of hypotension 8/11- 12 w/ BP's 80's- 90's for about 12 hrs     BP's today are  better 110 - 120 systolic   HR 80  RR 18   Assessment/ Plan: AKI - b/l creat is normal at 0.8- 0.9 from July 2022, eGFR > 60 ml/min. Pt admitted w/ sepsis and has sig low BP's for periods of time since admission. UA +proteinuria, will get UPC ratio.  No hematuria argues against a GN. Imaging did not show any signs of obstruction.  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 Low plts, will order smear. Suspect shock/ hypoperfusion/ prerenal cause most likely. Looks dry still, will bolus again and resume IVF's at 100/hr w/ bicarb gtt.  Hypotension - vol depletion, sepsis, other. Giving more IVF's. Is on midodrine here as well.  Severe HIV / AIDS Sepsis / grp A strep bacteremia - seems to be improving      Rob Schertz  MD 03/06/2021, 6:37 PM  Recent Labs  Lab 03/05/21 0255 03/06/21 0020  WBC 4.0 4.0  HGB 11.9* 10.2*   Recent Labs  Lab 03/05/21 0255 03/06/21 0020  K 4.5 4.2  BUN 56* 70*  CREATININE 5.17* 6.24*  CALCIUM 6.8* 6.8*    

## 2021-03-07 DIAGNOSIS — Z515 Encounter for palliative care: Secondary | ICD-10-CM

## 2021-03-07 DIAGNOSIS — Z8619 Personal history of other infectious and parasitic diseases: Secondary | ICD-10-CM

## 2021-03-07 DIAGNOSIS — Z7189 Other specified counseling: Secondary | ICD-10-CM

## 2021-03-07 LAB — COMPREHENSIVE METABOLIC PANEL WITH GFR
ALT: 14 U/L (ref 0–44)
AST: 34 U/L (ref 15–41)
Albumin: 1.6 g/dL — ABNORMAL LOW (ref 3.5–5.0)
Alkaline Phosphatase: 90 U/L (ref 38–126)
Anion gap: 15 (ref 5–15)
BUN: 82 mg/dL — ABNORMAL HIGH (ref 6–20)
CO2: 16 mmol/L — ABNORMAL LOW (ref 22–32)
Calcium: 6.9 mg/dL — ABNORMAL LOW (ref 8.9–10.3)
Chloride: 93 mmol/L — ABNORMAL LOW (ref 98–111)
Creatinine, Ser: 6.85 mg/dL — ABNORMAL HIGH (ref 0.61–1.24)
GFR, Estimated: 10 mL/min — ABNORMAL LOW
Glucose, Bld: 91 mg/dL (ref 70–99)
Potassium: 3.6 mmol/L (ref 3.5–5.1)
Sodium: 124 mmol/L — ABNORMAL LOW (ref 135–145)
Total Bilirubin: 0.9 mg/dL (ref 0.3–1.2)
Total Protein: 5.8 g/dL — ABNORMAL LOW (ref 6.5–8.1)

## 2021-03-07 LAB — CBC WITH DIFFERENTIAL/PLATELET
Abs Immature Granulocytes: 0.39 10*3/uL — ABNORMAL HIGH (ref 0.00–0.07)
Basophils Absolute: 0 10*3/uL (ref 0.0–0.1)
Basophils Relative: 0 %
Eosinophils Absolute: 0.7 10*3/uL — ABNORMAL HIGH (ref 0.0–0.5)
Eosinophils Relative: 15 %
HCT: 25.9 % — ABNORMAL LOW (ref 39.0–52.0)
Hemoglobin: 8.8 g/dL — ABNORMAL LOW (ref 13.0–17.0)
Immature Granulocytes: 8 %
Lymphocytes Relative: 12 %
Lymphs Abs: 0.6 10*3/uL — ABNORMAL LOW (ref 0.7–4.0)
MCH: 29.8 pg (ref 26.0–34.0)
MCHC: 34 g/dL (ref 30.0–36.0)
MCV: 87.8 fL (ref 80.0–100.0)
Monocytes Absolute: 0.4 10*3/uL (ref 0.1–1.0)
Monocytes Relative: 8 %
Neutro Abs: 2.7 10*3/uL (ref 1.7–7.7)
Neutrophils Relative %: 57 %
Platelets: 47 10*3/uL — ABNORMAL LOW (ref 150–400)
RBC: 2.95 MIL/uL — ABNORMAL LOW (ref 4.22–5.81)
RDW: 16.7 % — ABNORMAL HIGH (ref 11.5–15.5)
WBC: 4.8 10*3/uL (ref 4.0–10.5)
nRBC: 0 % (ref 0.0–0.2)

## 2021-03-07 LAB — SAVE SMEAR(SSMR), FOR PROVIDER SLIDE REVIEW

## 2021-03-07 LAB — GLUCOSE, CAPILLARY
Glucose-Capillary: 67 mg/dL — ABNORMAL LOW (ref 70–99)
Glucose-Capillary: 73 mg/dL (ref 70–99)

## 2021-03-07 MED ORDER — FLUCONAZOLE 40 MG/ML PO SUSR: 100.0000 mg | Freq: Every day | ORAL | Status: DC

## 2021-03-07 MED ORDER — MAGIC MOUTHWASH W/LIDOCAINE
15.0000 mL | Freq: Four times a day (QID) | ORAL | Status: DC | PRN
  Administered 2021-03-10: 15 mL via ORAL
  Filled 2021-03-07 (×2): qty 15

## 2021-03-07 MED ORDER — CEFADROXIL 1 G PO TABS
1.0000 g | ORAL_TABLET | ORAL | Status: DC
  Filled 2021-03-07: qty 1

## 2021-03-07 MED ORDER — NYSTATIN 100000 UNIT/ML MT SUSP
5.0000 mL | Freq: Three times a day (TID) | OROMUCOSAL | Status: DC
  Administered 2021-03-07 – 2021-03-11 (×18): 500000 [IU] via OROMUCOSAL
  Filled 2021-03-07 (×14): qty 5

## 2021-03-07 MED ORDER — MIDODRINE HCL 5 MG PO TABS
5.0000 mg | ORAL_TABLET | Freq: Three times a day (TID) | ORAL | Status: DC
  Administered 2021-03-07: 5 mg via ORAL
  Filled 2021-03-07: qty 1

## 2021-03-07 MED ORDER — PHENOL 1.4 % MT LIQD: 1.0000 | OROMUCOSAL | Status: DC | PRN

## 2021-03-07 NOTE — Consult Note (Signed)
Palliative Medicine Inpatient Consult Note  Reason for consult:  Goals of care discussion  HPI:  Per intake H&P -on 8/11 by Dr. Cyd Silence HPI:  35 year old male with past medical history of HIV/AIDS (CD4 < 35 09/2020), medication noncompliance, history of syphilis who presents to [facility] emergency department with complaints of nausea vomiting and back pain.  Of note, patient was recently hospitalized at [facility] from 7/19 until 7/25.  During that hospitalization patient was treated for sepsis secondary to pyelonephritis secondary to ESBL E. coli.  Presentation was complicated by associated acute kidney injury.  Patient was treated with 5 days of intravenous meropenem as well as intravenous fluids.  Acute kidney injury resolved, patient clinically improved.  Patient was additionally treated with fluconazole for observed thrush.  Patient was discharged home on 7/25.   Patient explains that he has felt at or near his baseline since discharge up until approximately 2 days ago.  At that point patient began to experience back pain.  Patient describes the back pain as moderate to severe in intensity, sharp in quality and located in the low back.  Pain is nonradiating.  Pain is been associated with severe nausea with frequent bouts of vomiting.  Patient additionally has had difficulty tolerating any oral intake over the span of time.  Patient is also been experiencing progressively worsening generalized weakness and bouts of diaphoresis.  Patient denies sick contacts recent travel or contact with confirmed COVID-19 infection.  Patient once again endorses being noncompliant with his antiretroviral therapy.   Due to patient's progressively worsening nausea vomiting and generalized weakness EMS was contacted who promptly came to evaluate the patient and brought him into [facility] emergency department for evaluation.   Upon evaluation in the emergency department, patient was  found to exhibit multiple SIRS criteria including fever of 100.4 F, severe tachycardia, tachypnea and leukopenia.  Patient was provided with 2 L of intravenous isotonic fluid in addition to initiation of intravenous meropenem.  Patient was found to have markedly elevated lactic acid level of 5.7.  Patient was also found to have recurrent acute kidney injury of 3.18.  The hospitalist group was then called to assess the patient for admission to the hospital.   Clinical Assessment/Goals of Care: I have reviewed medical records including EPIC notes, labs and imaging, and assessed the patient.    I met with Jacob Gutierrez and his mother to further discuss diagnosis prognosis, GOC, EOL wishes, disposition and options.  We discussed his current situation. He only recently lives in [place] area, previously in [place] and before that [place]. He says his friends are in [place] where there is a better supportive gay community. He is living with a friend of a friend. He was previously homeless in [place] for 3 month. His parents live in the area and are supportive. He was estranged from them for many years. He recently received disability benefits as he has been unable to keep a job for more than a week or two without being ill. His great aunt was present for the visit today and provided spiritual support and he seemed to be more hopeful after her visit per family.   I introduced Palliative Medicine as specialized medical care for people living with serious illness. It focuses on providing relief from the symptoms and stress of a serious illness. The goal is to improve quality of life for both the patient and the family.  A detailed discussion was had today regarding advanced directives.  Concepts specific to code status, artifical feeding and hydration, continued IV antibiotics and rehospitalization was had.  The difference between a aggressive medical intervention path  and a palliative comfort care  path for this patient at this time was had. Values and goals of care important to patient and family were attempted to be elicited.   Jacob Gutierrez wants to "get well." He says he wants everything done. We had a long conversation about nonadherence to medication plans. He states he is committed to taking his medicine every day at discharge. We discussed the long road ahead and what support he has. He states he does not like to rely on others. His mother says he tells her he just forgets to take his medicine. At this point Jacob Gutierrez does not want any restrictions to medical treatment. He is not ready to give up on any opportunity to get better. Now that he has financial support through disability he hopes that his social situation will improve and therefore his health.  Discussed the importance of continued conversation with family and their  medical providers regarding overall plan of care and treatment options, ensuring decisions are within the context of the patients values and GOCs.  Decision Maker: Patient  SUMMARY OF RECOMMENDATIONS    Code Status/Advance Care Planning: FULL CODE  Symptom Management:  Pain: Acetaminophen prn Mouth pain: Magic mouthwash Nausea: Ondansetron prn     Psycho-social/Spiritual:  Desire for further Chaplaincy support: yes, consult requested Additional Recommendations:  He could benefit with outpatient palliative medicine consult at discharge.    Other: Full scope of care.  Prognosis: Poor prognosis without significant modifications in adherence to medical regimen.  Discharge Planning: TBD   Vitals with BMI 03/07/2021 03/06/2021 03/06/2021  Height - - -  Weight - - -  BMI - - -  Systolic 497 530 051  Diastolic 73 62 74  Pulse 76 75 89    PPS:60%   This conversation/these recommendations were discussed with patient primary care team via secure chat, Dr. Avon Gully.  Thank you for the opportunity to participate in the care of this patient and family.    Time In:12:05 Time Out:1:20 Total Time: >70 minutes Greater than 50%  of this time was spent counseling and coordinating care related to the above assessment and plan.  Lindell Spar, NP [facility] Palliative Medicine Team Team Cell Phone: [phone] Please utilize secure chat with additional questions, if there is no response within 30 minutes please call the above phone number  Palliative Medicine Team providers are available by phone from 7am to 7pm daily and can be reached through the team cell phone.  Should this patient require assistance outside of these hours, please call the patient's attending physician.

## 2021-03-07 NOTE — Progress Notes (Signed)
PROGRESS NOTE    Jacob Gutierrez  FUX:[id number] DOB: 1986-04-06 DOA: 03/03/2021 PCP: Patient, No Pcp Per (Inactive)   Brief Narrative:  35 year old male with past medical history of HIV/AIDS (CD4 < 35 09/2020), medication noncompliance, history of syphilis who presents to [facility] emergency department with complaints of nausea vomiting and worsening back pain starting approximately 2 days prior to admission. Known previous infections including syphilis, shigella, coccidioidomycosis, cryptosporidium in the setting of immunodeficiency and medication noncompliance.   Assessment & Plan:   Principal Problem:   Sepsis due to Escherichia coli (E. coli) (HCC) Active Problems:   Human immunodeficiency virus (HIV) disease (HCC)   Hyponatremia   Acute kidney injury (HCC)   Lactic acidosis   GERD (gastroesophageal reflux disease)   Pressure injury of skin   Group A streptococcal infection   Sepsis due to group A Streptococcus without acute organ dysfunction (HCC)  Sepsis due to Group A strep bacteremia Unlikely ongoing ESBL Escherichia coli (E. coli) (HCC) Oral thrush, POA - History of recent ESBL E. coli pyelonephritis - Cultures remarkable for Group A Strep - ID consulted - De-escalate to cefazolin/linezolid through 8/14 - then transition to cefadroxil 1g IBD for 10 day course(total) - stop date 03/14/21 -Initiated nystatin swish and spit x5 days, follow clinically - Surgery signing off given no clear abdominal source and no need for ongoing procedure - Hold off on central line placements until repeat blood cultures report back negative (having IV access difficulty)  AKI, stabilizing Likely multifactorial-prerenal in the setting of poor p.o. intake -Nephrology following, appreciate insight and recommendations, likely multifactorial in the setting of hypotension, uncontrolled HIV, multifocal infections -Urine output improving over the past 24 hours minimally -Creatinine  appears to be plateauing at this point around 6.5  Profound lactic acidosis -Likely secondary to sepsis as above, continue IV fluids and follow-up for sources of infection -Downtrending appropriately with IV fluids as above   AIDS/Human immunodeficiency virus (HIV) disease (HCC) Recurrent profound medication noncompliance -Continue home HAART regimen Lab Results  Component Value Date   HIV1RNAQUANT 454,000 03/04/2021   Lab Results  Component Value Date   CD4TCELL 19 (L) 03/04/2021   CD4TABS <35 (L) 03/04/2021  -ID following - appreciate insight/recs  Hypoglycemia, profound, resolving -Patient does not carry diabetes diagnosis per chart review -A1c 5.9 -Continue D5W until PO intake improves -Advance diet as tolerated -patient continues to be markedly noncompliant with p.o. intake  Hypovolemic hyponatremia -In the setting of above poor p.o. intake -Continue IV fluids    Noncompliance  -Patient has known medication noncompliance, follow-up noncompliance and generally does not adhere to medical advice or recommendations -Patient refused to speak to hospitalist team today; hopefully he will be more interactive tomorrow  Substance abuse  - UDS previously positive for amphetamines and THC  - Counseled toward cessation    DVT prophylaxis: Lovenox Code Status: Full Family Communication: None available  Status is: Inpatient  Dispo: The patient is from: Home              Anticipated d/c is to: To be determined              Anticipated d/c date is: >72h              Patient currently not medically stable for discharge  Consultants:  General surgery  Procedures:  None planned  Antimicrobials:  Meropenem  Subjective: No acute issues or events overnight per report, patient refused to speak to medical team today review  of systems limited to chart review and nursing signout which was unremarkable  Objective: Vitals:   03/06/21 1910 03/06/21 2055 03/06/21 2342 03/07/21  0300  BP:  110/74 135/62 (!) 173/73  Pulse:  89 75 76  Resp:  (!) 21 18 18   Temp:  97.8 F (36.6 C) 97.8 F (36.6 C) 97.8 F (36.6 C)  TempSrc:  Oral Oral Oral  SpO2: 97% 97% 97% 99%  Weight:        Intake/Output Summary (Last 24 hours) at 03/07/2021 0724 Last data filed at 03/07/2021 0339 Gross per 24 hour  Intake 3468.65 ml  Output 1520 ml  Net 1948.65 ml    Filed Weights   03/03/21 2050  Weight: 79.4 kg    Examination:  General: No acute distress resting comfortably HEENT:  Normocephalic atraumatic.  Sclerae nonicteric, noninjected.  Extraocular movements intact bilaterally. Neck:  Without mass or deformity.  Trachea is midline. Lungs:  Clear to auscultate bilaterally without rhonchi, wheeze, or rales. Heart:  Regular rate and rhythm.  Without murmurs, rubs, or gallops. Abdomen:  Soft, nontender, nondistended.  Without guarding or rebound. Extremities: Without cyanosis, clubbing, edema, or obvious deformity. Vascular:  Dorsalis pedis and posterior tibial pulses palpable bilaterally. Skin:  Warm and dry, no erythema, no ulcerations.   Data Reviewed: I have personally reviewed following labs and imaging studies  CBC: Recent Labs  Lab 03/04/21 1133 03/04/21 2220 03/05/21 0255 03/06/21 0020 03/07/21 0229  WBC 2.2* 2.9* 4.0 4.0 4.8  NEUTROABS 0.4* 2.2 2.9 2.7 2.7  HGB 12.8* 12.5* 11.9* 10.2* 8.8*  HCT 37.9* 36.2* 34.7* 30.0* 25.9*  MCV 90.2 88.7 88.7 88.8 87.8  PLT 40* 47* 47* 46* 47*    Basic Metabolic Panel: Recent Labs  Lab 03/04/21 0553 03/04/21 2220 03/05/21 0255 03/05/21 0526 03/05/21 1207 03/05/21 1824 03/06/21 0020 03/07/21 0229  NA 126* 127* 125* 127* 126* 124* 124* 124*  K 4.2 4.2 4.5  --   --   --  4.2 3.6  CL 95* 93* 94*  --   --   --  93* 93*  CO2 15* 19* 18*  --   --   --  18* 16*  GLUCOSE 55* 73 92  --   --   --  119* 91  BUN 41* 54* 56*  --   --   --  70* 82*  CREATININE 3.70* 5.04* 5.17*  --   --   --  6.24* 6.85*  CALCIUM 7.2*  6.9* 6.8*  --   --   --  6.8* 6.9*    GFR: Estimated Creatinine Clearance: 16.6 mL/min (A) (by C-G formula based on SCr of 6.85 mg/dL (H)). Liver Function Tests: Recent Labs  Lab 03/04/21 0553 03/04/21 2220 03/05/21 0255 03/06/21 0020 03/07/21 0229  AST 158* 96* 84* 45* 34  ALT 41 38 37 28 14  ALKPHOS 63 72 68 70 90  BILITOT 4.6* 3.4* 3.4* 1.9* 0.9  PROT 7.6 6.4* 6.2* 5.9* 5.8*  ALBUMIN 2.2* 1.8* 1.7* 1.6* 1.6*    Recent Labs  Lab 03/03/21 1901  LIPASE 27    No results for input(s): AMMONIA in the last 168 hours. Coagulation Profile: Recent Labs  Lab 03/04/21 0553  INR 1.6*    Cardiac Enzymes: No results for input(s): CKTOTAL, CKMB, CKMBINDEX, TROPONINI in the last 168 hours. BNP (last 3 results) No results for input(s): PROBNP in the last 8760 hours. HbA1C: Recent Labs    03/04/21 1645  HGBA1C 5.9*  CBG: Recent Labs  Lab 03/06/21 0029 03/06/21 0630 03/06/21 1141 03/06/21 1818 03/06/21 2100  GLUCAP 105* 84 82 68* 65*    Lipid Profile: No results for input(s): CHOL, HDL, LDLCALC, TRIG, CHOLHDL, LDLDIRECT in the last 72 hours. Thyroid Function Tests: No results for input(s): TSH, T4TOTAL, FREET4, T3FREE, THYROIDAB in the last 72 hours. Anemia Panel: No results for input(s): VITAMINB12, FOLATE, FERRITIN, TIBC, IRON, RETICCTPCT in the last 72 hours. Sepsis Labs: Recent Labs  Lab 03/04/21 0553 03/04/21 1133 03/04/21 2245 03/05/21 0255  PROCALCITON >150.00  --   --   --   LATICACIDVEN 5.5* 4.3* 4.1* 3.4*     Recent Results (from the past 240 hour(s))  Blood Culture (routine x 2)     Status: Abnormal   Collection Time: 03/03/21  8:36 PM   Specimen: Site Not Specified; Blood  Result Value Ref Range Status   Specimen Description SITE NOT SPECIFIED  Final   Special Requests   Final    BOTTLES DRAWN AEROBIC ONLY Blood Culture results may not be optimal due to an inadequate volume of blood received in culture bottles   Culture  Setup Time    Final    GRAM POSITIVE COCCI IN CHAINS AEROBIC BOTTLE ONLY CRITICAL RESULT CALLED TO, READ BACK BY AND VERIFIED WITH: PHARMD ALEX  L. [id number] FCP    Culture (A)  Final    STREPTOCOCCUS PYOGENES HEALTH DEPARTMENT NOTIFIED Performed at [facility], [address], [place], [place] [address]    Report Status 03/06/2021 FINAL  Final   Organism ID, Bacteria STREPTOCOCCUS PYOGENES  Final      Susceptibility   Streptococcus pyogenes - MIC*    PENICILLIN <=0.06 SENSITIVE Sensitive     CEFTRIAXONE <=0.12 SENSITIVE Sensitive     ERYTHROMYCIN >=8 RESISTANT Resistant     LEVOFLOXACIN 4 INTERMEDIATE Intermediate     VANCOMYCIN <=0.12 SENSITIVE Sensitive     * STREPTOCOCCUS PYOGENES  Blood Culture ID Panel (Reflexed)     Status: Abnormal   Collection Time: 03/03/21  8:36 PM  Result Value Ref Range Status   Enterococcus faecalis NOT DETECTED NOT DETECTED Final   Enterococcus Faecium NOT DETECTED NOT DETECTED Final   Listeria monocytogenes NOT DETECTED NOT DETECTED Final   Staphylococcus species NOT DETECTED NOT DETECTED Final   Staphylococcus aureus (BCID) NOT DETECTED NOT DETECTED Final   Staphylococcus epidermidis NOT DETECTED NOT DETECTED Final   Staphylococcus lugdunensis NOT DETECTED NOT DETECTED Final   Streptococcus species DETECTED (A) NOT DETECTED Final    Comment: CRITICAL RESULT CALLED TO, READ BACK BY AND VERIFIED WITH: PHARMD ALEX  L. [phone] FCP    Streptococcus agalactiae NOT DETECTED NOT DETECTED Final   Streptococcus pneumoniae NOT DETECTED NOT DETECTED Final   Streptococcus pyogenes DETECTED (A) NOT DETECTED Final    Comment: CRITICAL RESULT CALLED TO, READ BACK BY AND VERIFIED WITH: PHARMD ALEX  L. [id number] FCP    A.calcoaceticus-baumannii NOT DETECTED NOT DETECTED Final   Bacteroides fragilis NOT DETECTED NOT DETECTED Final   Enterobacterales NOT DETECTED NOT DETECTED Final   Enterobacter cloacae complex NOT DETECTED NOT DETECTED Final   Escherichia  coli NOT DETECTED NOT DETECTED Final   Klebsiella aerogenes NOT DETECTED NOT DETECTED Final   Klebsiella oxytoca NOT DETECTED NOT DETECTED Final   Klebsiella pneumoniae NOT DETECTED NOT DETECTED Final   Proteus species NOT DETECTED NOT DETECTED Final   Salmonella species NOT DETECTED NOT DETECTED Final   Serratia marcescens NOT  DETECTED NOT DETECTED Final   Haemophilus influenzae NOT DETECTED NOT DETECTED Final   Neisseria meningitidis NOT DETECTED NOT DETECTED Final   Pseudomonas aeruginosa NOT DETECTED NOT DETECTED Final   Stenotrophomonas maltophilia NOT DETECTED NOT DETECTED Final   Candida albicans NOT DETECTED NOT DETECTED Final   Candida auris NOT DETECTED NOT DETECTED Final   Candida glabrata NOT DETECTED NOT DETECTED Final   Candida krusei NOT DETECTED NOT DETECTED Final   Candida parapsilosis NOT DETECTED NOT DETECTED Final   Candida tropicalis NOT DETECTED NOT DETECTED Final   Cryptococcus neoformans/gattii NOT DETECTED NOT DETECTED Final    Comment: Performed at [facility], [address] [place], [place] [address]  Blood Culture (routine x 2)     Status: Abnormal (Preliminary result)   Collection Time: 03/03/21  8:41 PM   Specimen: BLOOD  Result Value Ref Range Status   Specimen Description BLOOD RIGHT UPPER  Final   Special Requests   Final    BOTTLES DRAWN AEROBIC AND ANAEROBIC Blood Culture results may not be optimal due to an inadequate volume of blood received in culture bottles   Culture  Setup Time   Final    GRAM POSITIVE COCCI IN BOTH AEROBIC AND ANAEROBIC BOTTLES CRITICAL VALUE NOTED.  VALUE IS CONSISTENT WITH PREVIOUSLY REPORTED AND CALLED VALUE.    Culture (A)  Final    GROUP A STREP (S.PYOGENES) ISOLATED HEALTH DEPARTMENT NOTIFIED Performed at [facility], [address] [place], [place] [address]    Report Status PENDING  Incomplete  Resp Panel by RT-PCR (Flu A&B, Covid) Nasopharyngeal Swab     Status: None   Collection Time: 03/03/21  10:49 PM   Specimen: Nasopharyngeal Swab; Nasopharyngeal(NP) swabs in vial transport medium  Result Value Ref Range Status   SARS Coronavirus 2 by RT PCR NEGATIVE NEGATIVE Final    Comment: (NOTE) SARS-CoV-2 target nucleic acids are NOT DETECTED.  The SARS-CoV-2 RNA is generally detectable in upper respiratory specimens during the acute phase of infection. The lowest concentration of SARS-CoV-2 viral copies this assay can detect is 138 copies/mL. A negative result does not preclude SARS-Cov-2 infection and should not be used as the sole basis for treatment or other patient management decisions. A negative result may occur with  improper specimen collection/handling, submission of specimen other than nasopharyngeal swab, presence of viral mutation(s) within the areas targeted by this assay, and inadequate number of viral copies(<138 copies/mL). A negative result must be combined with clinical observations, patient history, and epidemiological information. The expected result is Negative.  Fact Sheet for Patients:  BloggerCourse.com  Fact Sheet for Healthcare Providers:  SeriousBroker.it  This test is no t yet approved or cleared by the [place] FDA and  has been authorized for detection and/or diagnosis of SARS-CoV-2 by FDA under an Emergency Use Authorization (EUA). This EUA will remain  in effect (meaning this test can be used) for the duration of the COVID-19 declaration under Section 564(b)(1) of the Act, 21 U.S.C.section 360bbb-3(b)(1), unless the authorization is terminated  or revoked sooner.       Influenza A by PCR NEGATIVE NEGATIVE Final   Influenza B by PCR NEGATIVE NEGATIVE Final    Comment: (NOTE) The Xpert Xpress SARS-CoV-2/FLU/RSV plus assay is intended as an aid in the diagnosis of influenza from Nasopharyngeal swab specimens and should not be used as a sole basis for treatment. Nasal washings and aspirates  are unacceptable for Xpert Xpress SARS-CoV-2/FLU/RSV testing.  Fact Sheet  for Patients: BloggerCourse.comhttps://www.fda.gov/media/152166/download  Fact Sheet for Healthcare Providers: SeriousBroker.ithttps://www.fda.gov/media/152162/download  This test is not yet approved or cleared by the [place] FDA and has been authorized for detection and/or diagnosis of SARS-CoV-2 by FDA under an Emergency Use Authorization (EUA). This EUA will remain in effect (meaning this test can be used) for the duration of the COVID-19 declaration under Section 564(b)(1) of the Act, 21 U.S.C. section 360bbb-3(b)(1), unless the authorization is terminated or revoked.  Performed at [facility], [address] [place], [place] [address]   MRSA Next Gen by PCR, Nasal     Status: None   Collection Time: 03/05/21  1:26 AM   Specimen: Nasal Mucosa; Nasal Swab  Result Value Ref Range Status   MRSA by PCR Next Gen NOT DETECTED NOT DETECTED Final    Comment: (NOTE) The GeneXpert MRSA Assay (FDA approved for NASAL specimens only), is one component of a comprehensive MRSA colonization surveillance program. It is not intended to diagnose MRSA infection nor to guide or monitor treatment for MRSA infections. Test performance is not FDA approved in patients less than 37 years old. Performed at [facility], [address] [place], [place] [address]           Radiology Studies: US RENAL  Result Date: 03/06/2021 CLINICAL DATA:  Acute renal injury. EXAM: RENAL / URINARY TRACT ULTRASOUND COMPLETE COMPARISON:  February 11, 2021 FINDINGS: Right Kidney: Renal measurements: 13.3 cm x 6.8 cm x 8.4 cm = volume: 396.2 mL. There is diffusely increased echogenicity of the renal parenchyma. No mass or hydronephrosis visualized. A mild amount of right-sided perinephric fluid is seen. Left Kidney: Renal measurements: 12.3 cm x 6.5 cm x 6.9 cm = volume: 266.0 mL. There is diffusely increased echogenicity of the renal parenchyma. No mass or  hydronephrosis visualized. A mild amount of left-sided perinephric fluid is seen. Bladder: Partially decompressed and otherwise unremarkable. Other: A mild amount of ascites is noted. IMPRESSION: 1. Increased renal echogenicity which may represent sequelae associated with medical renal disease. 2. Ascites with bilateral perinephric fluid. Electronically Signed   By: Aram Candelahaddeus  Houston M.D.   On: 03/06/2021 22:03    Scheduled Meds:  [START ON 03/08/2021] cefadroxil  1 g Oral Q12H   Darunavir-Cobicisctat-Emtricitabine-Tenofovir Alafenamide  1 tablet Oral Q breakfast   enoxaparin (LOVENOX) injection  40 mg Subcutaneous Q24H   midodrine  10 mg Oral TID WC   sodium chloride flush  10-40 mL Intracatheter Q12H   [START ON 03/08/2021] sulfamethoxazole-trimethoprim  1 tablet Oral Once per day on Mon Wed Fri   Continuous Infusions:   ceFAZolin (ANCEF) IV 2 g (03/07/21 0020)   dextrose 20 mL/hr at 03/06/21 1905   linezolid (ZYVOX) IV 600 mg (03/07/21 0113)   sodium bicarbonate 150 mEq in D5W infusion 50 mL/hr at 03/07/21 0015   sodium chloride       LOS: 3 days   Time spent: 35min  Azucena FallenWilliam C Jacora Hopkins, DO [facility]  If 7PM-7AM, please contact night-coverage [email or web address]  03/07/2021, 7:24 AM

## 2021-03-07 NOTE — Progress Notes (Addendum)
[facility] Progress Note  Subjective: UOP better, 920 cc yest and 600 cc today. Na 124 today, creat 6.8 , up from 6.24.  K okay. Ca 6.9.  Alb 1.6.   Pt seen in room, lots of complaints, abd pain, full stomach, can't take a deep breath. Per RN O2 sats are wnl.  Legs are swelling a bit.   BP's are better 170/70, HR 70 and RR 16-20  afeb    Vitals:   03/06/21 1910 03/06/21 2055 03/06/21 2342 03/07/21 0300  BP:  110/74 135/62 (!) 173/73  Pulse:  89 75 76  Resp:  (!) '21 18 18  ' Temp:  97.8 F (36.6 C) 97.8 F (36.6 C) 97.8 F (36.6 C)  TempSrc:  Oral Oral Oral  SpO2: 97% 97% 97% 99%  Weight:        Exam: Gen alert, no distress, frail and chron ill appearing Flat neck veins Chest clear bilat to bases RRR no MRG Abd soft ntnd no mass or ascites +bs GU normal male MS no joint effusions or deformity Ext 1+ bilat LE edema, no wounds or ulcers Neuro is alert, Ox 3 , nf, no asterixis       UA 02/11/21 - 30 prot, 21-50 wbc, 0-5 rbc   UA 03/05/21 - >300 prot, 0-5 rbc/ wbc, rare bact   CXR 8/10 -   IMPRESSION: No active disease   Renal US - 11-13 cm kidneys w/o hydro, ^'d echo bilat, bilat mild perinephric fluid, no hydro    UNa, UCr pend     Peripheral smear - low schistocyte count average 1 per HPF (undersigned)        Assessment/ Plan: AKI - b/l creat is normal at 0.8- 0.9 from July 2022, eGFR > 60 ml/min. Pt admitted w/ sepsis and had low BP's in the 80's- 90's for a good period of time. UA +proteinuria, no RBC/WBC though. UPC ratio pending.  Doubt GN. Renal US w/o obstruction.  HIV nephropathy a possibility. Suspect shock/ hypoperfusion/ prerenal most likely cause of AKI. Has very low albumin. Intravasc dry yest so started IVF"s. C/o SOB but sats very good and lungs are clear, will cont bicarb gtt at  65/hr.  Creat up but rate of rise lower and no signs of uremia yet. Cont supportive care.  Hypotension - is on midodrine, BP's much better today. Will reduce midodrine  to 5 tid.  Severe HIV / AIDS Sepsis / grp A strep bacteremia - seems to be improving Thrombocytopenia - plts 45k , no worse or better, prob 2/2 infection GOC - spoke w/ patient briefly about [place], said he will speak to palliative group, will consult them.          Rob Sherilyn Windhorst 03/07/2021, 8:14 AM   Recent Labs  Lab 03/06/21 0020 03/07/21 0229  K 4.2 3.6  BUN 70* 82*  CREATININE 6.24* 6.85*  CALCIUM 6.8* 6.9*  HGB 10.2* 8.8*   Inpatient medications:  [START ON 03/08/2021] cefadroxil  1 g Oral Q24H   Darunavir-Cobicisctat-Emtricitabine-Tenofovir Alafenamide  1 tablet Oral Q breakfast   enoxaparin (LOVENOX) injection  40 mg Subcutaneous Q24H   midodrine  10 mg Oral TID WC   nystatin  5 mL Mouth/Throat TID AC & HS   sodium chloride flush  10-40 mL Intracatheter Q12H   [START ON 03/08/2021] sulfamethoxazole-trimethoprim  1 tablet Oral Once per day on Mon Wed Fri     ceFAZolin (ANCEF) IV 2 g (03/07/21 0020)   dextrose 20 mL/hr  at 03/06/21 1905   linezolid (ZYVOX) IV 600 mg (03/07/21 0113)   sodium bicarbonate 150 mEq in D5W infusion 50 mL/hr at 03/07/21 0015   sodium chloride     acetaminophen **OR** acetaminophen, lip balm, magic mouthwash w/lidocaine, ondansetron **OR** ondansetron (ZOFRAN) IV, phenol, sodium chloride flush

## 2021-03-07 NOTE — Progress Notes (Signed)
Pt c/o not being able to eat anything and the only thing he can hold down is water at this time. He all so c/o mouth and throat pain. MD was Notified will cont to monitor.

## 2021-03-08 DIAGNOSIS — A400 Sepsis due to streptococcus, group A: Secondary | ICD-10-CM

## 2021-03-08 DIAGNOSIS — K219 Gastro-esophageal reflux disease without esophagitis: Secondary | ICD-10-CM

## 2021-03-08 DIAGNOSIS — B950 Streptococcus, group A, as the cause of diseases classified elsewhere: Secondary | ICD-10-CM

## 2021-03-08 LAB — GLUCOSE, CAPILLARY
Glucose-Capillary: 129 mg/dL — ABNORMAL HIGH (ref 70–99)
Glucose-Capillary: 56 mg/dL — ABNORMAL LOW (ref 70–99)
Glucose-Capillary: 75 mg/dL (ref 70–99)
Glucose-Capillary: 82 mg/dL (ref 70–99)

## 2021-03-08 LAB — COMPREHENSIVE METABOLIC PANEL
ALT: 5 U/L (ref 0–44)
AST: 24 U/L (ref 15–41)
Albumin: 1.8 g/dL — ABNORMAL LOW (ref 3.5–5.0)
Alkaline Phosphatase: 77 U/L (ref 38–126)
Anion gap: 13 (ref 5–15)
BUN: 86 mg/dL — ABNORMAL HIGH (ref 6–20)
CO2: 23 mmol/L (ref 22–32)
Calcium: 6.9 mg/dL — ABNORMAL LOW (ref 8.9–10.3)
Chloride: 93 mmol/L — ABNORMAL LOW (ref 98–111)
Creatinine, Ser: 7.77 mg/dL — ABNORMAL HIGH (ref 0.61–1.24)
GFR, Estimated: 8 mL/min — ABNORMAL LOW (ref >60)
Glucose, Bld: 75 mg/dL (ref 70–99)
Potassium: 3.3 mmol/L — ABNORMAL LOW (ref 3.5–5.1)
Sodium: 129 mmol/L — ABNORMAL LOW (ref 135–145)
Total Bilirubin: 0.9 mg/dL (ref 0.3–1.2)
Total Protein: 6.3 g/dL — ABNORMAL LOW (ref 6.5–8.1)

## 2021-03-08 LAB — URINE CULTURE: Culture: NO GROWTH

## 2021-03-08 LAB — CBC WITH DIFFERENTIAL/PLATELET
Abs Immature Granulocytes: 0.36 10*3/uL — ABNORMAL HIGH (ref 0.00–0.07)
Basophils Absolute: 0 10*3/uL (ref 0.0–0.1)
Basophils Relative: 0 %
Eosinophils Absolute: 0.7 10*3/uL — ABNORMAL HIGH (ref 0.0–0.5)
Eosinophils Relative: 17 %
HCT: 27.7 % — ABNORMAL LOW (ref 39.0–52.0)
Hemoglobin: 9.7 g/dL — ABNORMAL LOW (ref 13.0–17.0)
Immature Granulocytes: 9 %
Lymphocytes Relative: 21 %
Lymphs Abs: 0.8 10*3/uL (ref 0.7–4.0)
MCH: 30.5 pg (ref 26.0–34.0)
MCHC: 35 g/dL (ref 30.0–36.0)
MCV: 87.1 fL (ref 80.0–100.0)
Monocytes Absolute: 0.3 10*3/uL (ref 0.1–1.0)
Monocytes Relative: 8 %
Neutro Abs: 1.7 10*3/uL (ref 1.7–7.7)
Neutrophils Relative %: 45 %
Platelets: 49 10*3/uL — ABNORMAL LOW (ref 150–400)
RBC: 3.18 MIL/uL — ABNORMAL LOW (ref 4.22–5.81)
RDW: 16.6 % — ABNORMAL HIGH (ref 11.5–15.5)
WBC: 3.9 10*3/uL — ABNORMAL LOW (ref 4.0–10.5)
nRBC: 0 % (ref 0.0–0.2)

## 2021-03-08 MED ORDER — CEFADROXIL 500 MG PO CAPS
1.0000 g | ORAL_CAPSULE | ORAL | Status: DC
  Administered 2021-03-08 – 2021-03-11 (×4): 1000 mg via ORAL
  Filled 2021-03-08 (×4): qty 2

## 2021-03-08 NOTE — Progress Notes (Signed)
Patient ID: Jacob Gutierrez, male   DOB: 08/05/1985, 35 y.o.   MRN: [id number] [facility] Progress Note   Assessment/ Plan:   1. Acute kidney Injury: With seemingly normal baseline renal function (creatinine 0.8-0.9).  He is nonoliguric and suspected to have ATN likely associated with sepsis/ischemic ATN from relative hypotension.  Awaiting labs from this morning to assess for GFR/electrolytes as he continues to get intravenous fluids at low rate to supplement ongoing oral intake.  Not uremic on my exam and without any critical volume overload to prompt need for dialysis. 2.  Hypertension: He is currently hypertensive and I will discontinue midodrine. 3.  Group A streptococcus bacteremia/sepsis: On antibiotic therapy with cefadroxil after transition from Ancef/linezolid. 4.  HIV infection/AIDS: Poor adherence to antiretroviral therapy as an outpatient but based on palliative care discussion yesterday, apparently motivated to do better. 5.  Hyponatremia: Secondary to free water excretion defect in the setting of acute kidney injury.  Monitor on serial labs.  Subjective:   Reports to be feeling well and apologetic about "snapping earlier at a medical provider because he was in urgent need to use the bathroom"   Objective:   BP (!) 157/68 (BP Location: Left Leg)   Pulse 76   Temp (!) 97.5 F (36.4 C) (Oral)   Resp 15   Wt 82.3 kg   SpO2 98%   BMI 23.94 kg/m   Intake/Output Summary (Last 24 hours) at 03/08/2021 1660 Last data filed at 03/08/2021 0300 Gross per 24 hour  Intake 3259.44 ml  Output 500 ml  Net 2759.44 ml   Weight change:   Physical Exam: Gen: Comfortably resting in bed, appears chronically ill CVS: Pulse regular rhythm, normal rate, S1 and S2 normal Resp: Anteriorly clear to auscultation bilaterally, no rales/rhonchi Abd: Soft, flat, nontender, bowel sounds normal Ext: 1+ bilateral lower extremity edema  Imaging: US RENAL  Result Date:  03/06/2021 CLINICAL DATA:  Acute renal injury. EXAM: RENAL / URINARY TRACT ULTRASOUND COMPLETE COMPARISON:  February 11, 2021 FINDINGS: Right Kidney: Renal measurements: 13.3 cm x 6.8 cm x 8.4 cm = volume: 396.2 mL. There is diffusely increased echogenicity of the renal parenchyma. No mass or hydronephrosis visualized. A mild amount of right-sided perinephric fluid is seen. Left Kidney: Renal measurements: 12.3 cm x 6.5 cm x 6.9 cm = volume: 266.0 mL. There is diffusely increased echogenicity of the renal parenchyma. No mass or hydronephrosis visualized. A mild amount of left-sided perinephric fluid is seen. Bladder: Partially decompressed and otherwise unremarkable. Other: A mild amount of ascites is noted. IMPRESSION: 1. Increased renal echogenicity which may represent sequelae associated with medical renal disease. 2. Ascites with bilateral perinephric fluid. Electronically Signed   By: Aram Candela M.D.   On: 03/06/2021 22:03    Labs: BMET Recent Labs  Lab 03/03/21 1901 03/04/21 0553 03/04/21 2220 03/05/21 0255 03/05/21 0526 03/05/21 1207 03/05/21 1824 03/06/21 0020 03/07/21 0229  NA 126* 126* 127* 125* 127* 126* 124* 124* 124*  K 3.5 4.2 4.2 4.5  --   --   --  4.2 3.6  CL 93* 95* 93* 94*  --   --   --  93* 93*  CO2 14* 15* 19* 18*  --   --   --  18* 16*  GLUCOSE 65* 55* 73 92  --   --   --  119* 91  BUN 33* 41* 54* 56*  --   --   --  70* 82*  CREATININE 3.18*  3.70* 5.04* 5.17*  --   --   --  6.24* 6.85*  CALCIUM 8.1* 7.2* 6.9* 6.8*  --   --   --  6.8* 6.9*   CBC Recent Labs  Lab 03/04/21 2220 03/05/21 0255 03/06/21 0020 03/07/21 0229  WBC 2.9* 4.0 4.0 4.8  NEUTROABS 2.2 2.9 2.7 2.7  HGB 12.5* 11.9* 10.2* 8.8*  HCT 36.2* 34.7* 30.0* 25.9*  MCV 88.7 88.7 88.8 87.8  PLT 47* 47* 46* 47*    Medications:     cefadroxil  1 g Oral Q24H   Darunavir-Cobicisctat-Emtricitabine-Tenofovir Alafenamide  1 tablet Oral Q breakfast   midodrine  5 mg Oral TID WC   nystatin  5 mL  Mouth/Throat TID AC & HS   sodium chloride flush  10-40 mL Intracatheter Q12H   sulfamethoxazole-trimethoprim  1 tablet Oral Once per day on Mon Wed Fri   Zetta Bills, MD 03/08/2021, 8:14 AM

## 2021-03-08 NOTE — Progress Notes (Signed)
PROGRESS NOTE    Jacob Gutierrez  EQA:[id number] DOB: [birth year] DOA: 03/03/2021 PCP: Patient, No Pcp Per (Inactive)   Brief Narrative:  35 year old male with past medical history of HIV/AIDS (CD4 < 35 09/2020), medication noncompliance, history of syphilis who presents to [facility] emergency department with complaints of nausea vomiting and worsening back pain starting approximately 2 days prior to admission. Known previous infections including syphilis, shigella, coccidioidomycosis, cryptosporidium in the setting of immunodeficiency and medication noncompliance.   Assessment & Plan:   Principal Problem:   Sepsis due to Escherichia coli (E. coli) (HCC) Active Problems:   Human immunodeficiency virus (HIV) disease (HCC)   Hyponatremia   Acute kidney injury (HCC)   Lactic acidosis   GERD (gastroesophageal reflux disease)   Pressure injury of skin   Group A streptococcal infection   Sepsis due to group A Streptococcus without acute organ dysfunction (HCC)   Sepsis due to Group A strep bacteremia Unlikely ongoing ESBL Escherichia coli (E. coli) (HCC) Oral thrush, POA - History of recent ESBL E. coli pyelonephritis - Cultures remarkable for Group A Strep - ID consulted - De-escalate to cefazolin/linezolid to cefadroxil 1g for 10 day course(total) - 8/15 - 03/14/21 - Initiated nystatin swish and spit x5 days, follow clinically - Surgery signing off given no clear abdominal source and no need for ongoing procedure - Hold off on central line placements until repeat blood cultures report back negative (having IV access difficulty)  AKI, ongoing Likely multifactorial-prerenal in the setting of poor p.o. intake -Nephrology following, appreciate insight and recommendations, likely multifactorial in the setting of hypotension, uncontrolled HIV, multifocal infections -Urine output improving over the past 24 hours minimally -Creatinine appears to be plateauing at this point around  6.5  Profound lactic acidosis -Likely secondary to sepsis as above, continue IV fluids and follow-up for sources of infection -Downtrending appropriately with IV fluids as above   AIDS/Human immunodeficiency virus (HIV) disease (HCC) Recurrent profound medication noncompliance -Continue home HAART regimen Lab Results  Component Value Date   HIV1RNAQUANT 454,000 03/04/2021   Lab Results  Component Value Date   CD4TCELL 19 (L) 03/04/2021   CD4TABS <35 (L) 03/04/2021  -ID following - appreciate insight/recs  Hypoglycemia, profound, resolving -Patient does not carry diabetes diagnosis per chart review -A1c 5.9 -Continue D5W until PO intake improves -Advance diet as tolerated -patient continues to be markedly noncompliant with p.o. intake  Hypovolemic hyponatremia -In the setting of above poor p.o. intake -Continue IV fluids    Noncompliance  -Patient has known medication noncompliance, follow-up noncompliance and generally does not adhere to medical advice or recommendations  Substance abuse  - UDS previously positive for amphetamines and THC  - Counseled toward cessation    DVT prophylaxis: Lovenox Code Status: Full Family Communication: Updated mother over the phone  Status is: Inpatient  Dispo: The patient is from: Home              Anticipated d/c is to: To be determined              Anticipated d/c date is: >72h              Patient currently not medically stable for discharge  Consultants:  General surgery  Procedures:  None planned  Antimicrobials:  Meropenem  Subjective: No acute issues or events overnight per report, feels much better over the past 24h - urine output improving   Objective: Vitals:   03/07/21 0300 03/07/21 1947 03/08/21 0400 03/08/21 0440  BP: (!) 173/73   (!) 157/68  Pulse: 76     Resp: 18   15  Temp: 97.8 F (36.6 C) 97.6 F (36.4 C)  (!) 97.5 F (36.4 C)  TempSrc: Oral Oral  Oral  SpO2: 99%   98%  Weight:   82.3 kg      Intake/Output Summary (Last 24 hours) at 03/08/2021 0735 Last data filed at 03/08/2021 0300 Gross per 24 hour  Intake 3259.44 ml  Output 500 ml  Net 2759.44 ml    Filed Weights   03/03/21 2050 03/08/21 0400  Weight: 79.4 kg 82.3 kg    Examination:  General: No acute distress resting comfortably HEENT:  Normocephalic atraumatic.  Sclerae nonicteric, noninjected.  Extraocular movements intact bilaterally. Neck:  Without mass or deformity.  Trachea is midline. Lungs:  Clear to auscultate bilaterally without rhonchi, wheeze, or rales. Heart:  Regular rate and rhythm.  Without murmurs, rubs, or gallops. Abdomen:  Soft, nontender, nondistended.  Without guarding or rebound. Extremities: Without cyanosis, clubbing, edema, or obvious deformity. Vascular:  Dorsalis pedis and posterior tibial pulses palpable bilaterally. Skin:  Warm and dry, no erythema, no ulcerations.   Data Reviewed: I have personally reviewed following labs and imaging studies  CBC: Recent Labs  Lab 03/04/21 1133 03/04/21 2220 03/05/21 0255 03/06/21 0020 03/07/21 0229  WBC 2.2* 2.9* 4.0 4.0 4.8  NEUTROABS 0.4* 2.2 2.9 2.7 2.7  HGB 12.8* 12.5* 11.9* 10.2* 8.8*  HCT 37.9* 36.2* 34.7* 30.0* 25.9*  MCV 90.2 88.7 88.7 88.8 87.8  PLT 40* 47* 47* 46* 47*    Basic Metabolic Panel: Recent Labs  Lab 03/04/21 0553 03/04/21 2220 03/05/21 0255 03/05/21 0526 03/05/21 1207 03/05/21 1824 03/06/21 0020 03/07/21 0229  NA 126* 127* 125* 127* 126* 124* 124* 124*  K 4.2 4.2 4.5  --   --   --  4.2 3.6  CL 95* 93* 94*  --   --   --  93* 93*  CO2 15* 19* 18*  --   --   --  18* 16*  GLUCOSE 55* 73 92  --   --   --  119* 91  BUN 41* 54* 56*  --   --   --  70* 82*  CREATININE 3.70* 5.04* 5.17*  --   --   --  6.24* 6.85*  CALCIUM 7.2* 6.9* 6.8*  --   --   --  6.8* 6.9*    GFR: Estimated Creatinine Clearance: 16.7 mL/min (A) (by C-G formula based on SCr of 6.85 mg/dL (H)). Liver Function Tests: Recent Labs  Lab  03/04/21 0553 03/04/21 2220 03/05/21 0255 03/06/21 0020 03/07/21 0229  AST 158* 96* 84* 45* 34  ALT 41 38 37 28 14  ALKPHOS 63 72 68 70 90  BILITOT 4.6* 3.4* 3.4* 1.9* 0.9  PROT 7.6 6.4* 6.2* 5.9* 5.8*  ALBUMIN 2.2* 1.8* 1.7* 1.6* 1.6*    Recent Labs  Lab 03/03/21 1901  LIPASE 27    No results for input(s): AMMONIA in the last 168 hours. Coagulation Profile: Recent Labs  Lab 03/04/21 0553  INR 1.6*    Cardiac Enzymes: No results for input(s): CKTOTAL, CKMB, CKMBINDEX, TROPONINI in the last 168 hours. BNP (last 3 results) No results for input(s): PROBNP in the last 8760 hours. HbA1C: No results for input(s): HGBA1C in the last 72 hours.  CBG: Recent Labs  Lab 03/06/21 2100 03/07/21 1220 03/07/21 1516 03/08/21 0034 03/08/21 0701  GLUCAP 65* 67* 73 75 129*  Lipid Profile: No results for input(s): CHOL, HDL, LDLCALC, TRIG, CHOLHDL, LDLDIRECT in the last 72 hours. Thyroid Function Tests: No results for input(s): TSH, T4TOTAL, FREET4, T3FREE, THYROIDAB in the last 72 hours. Anemia Panel: No results for input(s): VITAMINB12, FOLATE, FERRITIN, TIBC, IRON, RETICCTPCT in the last 72 hours. Sepsis Labs: Recent Labs  Lab 03/04/21 0553 03/04/21 1133 03/04/21 2245 03/05/21 0255  PROCALCITON >150.00  --   --   --   LATICACIDVEN 5.5* 4.3* 4.1* 3.4*     Recent Results (from the past 240 hour(s))  Blood Culture (routine x 2)     Status: Abnormal   Collection Time: 03/03/21  8:36 PM   Specimen: Site Not Specified; Blood  Result Value Ref Range Status   Specimen Description SITE NOT SPECIFIED  Final   Special Requests   Final    BOTTLES DRAWN AEROBIC ONLY Blood Culture results may not be optimal due to an inadequate volume of blood received in culture bottles   Culture  Setup Time   Final    GRAM POSITIVE COCCI IN CHAINS AEROBIC BOTTLE ONLY CRITICAL RESULT CALLED TO, READ BACK BY AND VERIFIED WITH: PHARMD ALEX  L. [phone] FCP    Culture (A)  Final     STREPTOCOCCUS PYOGENES HEALTH DEPARTMENT NOTIFIED Performed at [facility], [address], [place], [place] [address]    Report Status 03/06/2021 FINAL  Final   Organism ID, Bacteria STREPTOCOCCUS PYOGENES  Final      Susceptibility   Streptococcus pyogenes - MIC*    PENICILLIN <=0.06 SENSITIVE Sensitive     CEFTRIAXONE <=0.12 SENSITIVE Sensitive     ERYTHROMYCIN >=8 RESISTANT Resistant     LEVOFLOXACIN 4 INTERMEDIATE Intermediate     VANCOMYCIN <=0.12 SENSITIVE Sensitive     * STREPTOCOCCUS PYOGENES  Blood Culture ID Panel (Reflexed)     Status: Abnormal   Collection Time: 03/03/21  8:36 PM  Result Value Ref Range Status   Enterococcus faecalis NOT DETECTED NOT DETECTED Final   Enterococcus Faecium NOT DETECTED NOT DETECTED Final   Listeria monocytogenes NOT DETECTED NOT DETECTED Final   Staphylococcus species NOT DETECTED NOT DETECTED Final   Staphylococcus aureus (BCID) NOT DETECTED NOT DETECTED Final   Staphylococcus epidermidis NOT DETECTED NOT DETECTED Final   Staphylococcus lugdunensis NOT DETECTED NOT DETECTED Final   Streptococcus species DETECTED (A) NOT DETECTED Final    Comment: CRITICAL RESULT CALLED TO, READ BACK BY AND VERIFIED WITH: PHARMD ALEX  L. [phone] FCP    Streptococcus agalactiae NOT DETECTED NOT DETECTED Final   Streptococcus pneumoniae NOT DETECTED NOT DETECTED Final   Streptococcus pyogenes DETECTED (A) NOT DETECTED Final    Comment: CRITICAL RESULT CALLED TO, READ BACK BY AND VERIFIED WITH: PHARMD ALEX  L. [phone] FCP    A.calcoaceticus-baumannii NOT DETECTED NOT DETECTED Final   Bacteroides fragilis NOT DETECTED NOT DETECTED Final   Enterobacterales NOT DETECTED NOT DETECTED Final   Enterobacter cloacae complex NOT DETECTED NOT DETECTED Final   Escherichia coli NOT DETECTED NOT DETECTED Final   Klebsiella aerogenes NOT DETECTED NOT DETECTED Final   Klebsiella oxytoca NOT DETECTED NOT DETECTED Final   Klebsiella pneumoniae NOT  DETECTED NOT DETECTED Final   Proteus species NOT DETECTED NOT DETECTED Final   Salmonella species NOT DETECTED NOT DETECTED Final   Serratia marcescens NOT DETECTED NOT DETECTED Final   Haemophilus influenzae NOT DETECTED NOT DETECTED Final   Neisseria meningitidis NOT DETECTED NOT DETECTED Final   Pseudomonas  aeruginosa NOT DETECTED NOT DETECTED Final   Stenotrophomonas maltophilia NOT DETECTED NOT DETECTED Final   Candida albicans NOT DETECTED NOT DETECTED Final   Candida auris NOT DETECTED NOT DETECTED Final   Candida glabrata NOT DETECTED NOT DETECTED Final   Candida krusei NOT DETECTED NOT DETECTED Final   Candida parapsilosis NOT DETECTED NOT DETECTED Final   Candida tropicalis NOT DETECTED NOT DETECTED Final   Cryptococcus neoformans/gattii NOT DETECTED NOT DETECTED Final    Comment: Performed at [facility], [address] [place], [place] [address]  Blood Culture (routine x 2)     Status: Abnormal (Preliminary result)   Collection Time: 03/03/21  8:41 PM   Specimen: BLOOD  Result Value Ref Range Status   Specimen Description BLOOD RIGHT UPPER  Final   Special Requests   Final    BOTTLES DRAWN AEROBIC AND ANAEROBIC Blood Culture results may not be optimal due to an inadequate volume of blood received in culture bottles   Culture  Setup Time   Final    GRAM POSITIVE COCCI IN BOTH AEROBIC AND ANAEROBIC BOTTLES CRITICAL VALUE NOTED.  VALUE IS CONSISTENT WITH PREVIOUSLY REPORTED AND CALLED VALUE.    Culture (A)  Final    GROUP A STREP (S.PYOGENES) ISOLATED HEALTH DEPARTMENT NOTIFIED Performed at [facility], [address] [place], [place] [address]    Report Status PENDING  Incomplete  Resp Panel by RT-PCR (Flu A&B, Covid) Nasopharyngeal Swab     Status: None   Collection Time: 03/03/21 10:49 PM   Specimen: Nasopharyngeal Swab; Nasopharyngeal(NP) swabs in vial transport medium  Result Value Ref Range Status   SARS Coronavirus 2 by RT PCR NEGATIVE NEGATIVE  Final    Comment: (NOTE) SARS-CoV-2 target nucleic acids are NOT DETECTED.  The SARS-CoV-2 RNA is generally detectable in upper respiratory specimens during the acute phase of infection. The lowest concentration of SARS-CoV-2 viral copies this assay can detect is 138 copies/mL. A negative result does not preclude SARS-Cov-2 infection and should not be used as the sole basis for treatment or other patient management decisions. A negative result may occur with  improper specimen collection/handling, submission of specimen other than nasopharyngeal swab, presence of viral mutation(s) within the areas targeted by this assay, and inadequate number of viral copies(<138 copies/mL). A negative result must be combined with clinical observations, patient history, and epidemiological information. The expected result is Negative.  Fact Sheet for Patients:  BloggerCourse.com  Fact Sheet for Healthcare Providers:  SeriousBroker.it  This test is no t yet approved or cleared by the [place] FDA and  has been authorized for detection and/or diagnosis of SARS-CoV-2 by FDA under an Emergency Use Authorization (EUA). This EUA will remain  in effect (meaning this test can be used) for the duration of the COVID-19 declaration under Section 564(b)(1) of the Act, 21 U.S.C.section 360bbb-3(b)(1), unless the authorization is terminated  or revoked sooner.       Influenza A by PCR NEGATIVE NEGATIVE Final   Influenza B by PCR NEGATIVE NEGATIVE Final    Comment: (NOTE) The Xpert Xpress SARS-CoV-2/FLU/RSV plus assay is intended as an aid in the diagnosis of influenza from Nasopharyngeal swab specimens and should not be used as a sole basis for treatment. Nasal washings and aspirates are unacceptable for Xpert Xpress SARS-CoV-2/FLU/RSV testing.  Fact Sheet for Patients: BloggerCourse.com  Fact Sheet for Healthcare  Providers: SeriousBroker.it  This test is not yet approved or cleared by the [place] and  has been authorized for detection and/or diagnosis of SARS-CoV-2 by FDA under an Emergency Use Authorization (EUA). This EUA will remain in effect (meaning this test can be used) for the duration of the COVID-19 declaration under Section 564(b)(1) of the Act, 21 U.S.C. section 360bbb-3(b)(1), unless the authorization is terminated or revoked.  Performed at [facility], [address] [place], [place] [address]   MRSA Next Gen by PCR, Nasal     Status: None   Collection Time: 03/05/21  1:26 AM   Specimen: Nasal Mucosa; Nasal Swab  Result Value Ref Range Status   MRSA by PCR Next Gen NOT DETECTED NOT DETECTED Final    Comment: (NOTE) The GeneXpert MRSA Assay (FDA approved for NASAL specimens only), is one component of a comprehensive MRSA colonization surveillance program. It is not intended to diagnose MRSA infection nor to guide or monitor treatment for MRSA infections. Test performance is not FDA approved in patients less than 59 years old. Performed at [facility], [address] [place], [place] [address]           Radiology Studies: US RENAL  Result Date: 03/06/2021 CLINICAL DATA:  Acute renal injury. EXAM: RENAL / URINARY TRACT ULTRASOUND COMPLETE COMPARISON:  February 11, 2021 FINDINGS: Right Kidney: Renal measurements: 13.3 cm x 6.8 cm x 8.4 cm = volume: 396.2 mL. There is diffusely increased echogenicity of the renal parenchyma. No mass or hydronephrosis visualized. A mild amount of right-sided perinephric fluid is seen. Left Kidney: Renal measurements: 12.3 cm x 6.5 cm x 6.9 cm = volume: 266.0 mL. There is diffusely increased echogenicity of the renal parenchyma. No mass or hydronephrosis visualized. A mild amount of left-sided perinephric fluid is seen. Bladder: Partially decompressed and otherwise unremarkable. Other: A mild amount of  ascites is noted. IMPRESSION: 1. Increased renal echogenicity which may represent sequelae associated with medical renal disease. 2. Ascites with bilateral perinephric fluid. Electronically Signed   By: Aram Candela M.D.   On: 03/06/2021 22:03    Scheduled Meds:  cefadroxil  1 g Oral Q24H   Darunavir-Cobicisctat-Emtricitabine-Tenofovir Alafenamide  1 tablet Oral Q breakfast   midodrine  5 mg Oral TID WC   nystatin  5 mL Mouth/Throat TID AC & HS   sodium chloride flush  10-40 mL Intracatheter Q12H   sulfamethoxazole-trimethoprim  1 tablet Oral Once per day on Mon Wed Fri   Continuous Infusions:  dextrose 20 mL/hr at 03/06/21 1905   sodium bicarbonate 150 mEq in D5W infusion 65 mL/hr at 03/08/21 0037     LOS: 4 days   Time spent:  Azucena Fallen, DO [facility]  If 7PM-7AM, please contact night-coverage [email or web address]  03/08/2021, 7:35 AM

## 2021-03-08 NOTE — Progress Notes (Signed)
This chaplain responded to PMT consult for spiritual care. The Pt. is awake and open to a visit.   The chaplain listened reflectively as the Pt. shares he is tired after just eating and ready to take a nap. The Pt. reflects on the pain he is experiencing from the blood draws from his hands. The Pt. is hopeful the fluids he has received will be helpful along with another location.  In the chaplain's process of rapport building and finding space for a spiritual care relationship, the chaplain understands the Pt. is at a good place with his mother and aunt after a lot of praying and crying.  The Pt. expresses his intentions of taking better care of himself.  The Pt. accepts the chaplain's invitation for F/U spiritual care in the morning along with prayer.

## 2021-03-08 NOTE — Progress Notes (Addendum)
Midline no longer drawing blood. Cap changed, line flushes without resistance. Site otherwise unremarkable. Will need phlebotomy draw going forward.

## 2021-03-09 ENCOUNTER — Ambulatory Visit: Payer: Medicaid Other | Admitting: Family

## 2021-03-09 LAB — COMPREHENSIVE METABOLIC PANEL
ALT: <5 U/L (ref 0–44)
AST: 22 U/L (ref 15–41)
Albumin: 1.8 g/dL — ABNORMAL LOW (ref 3.5–5.0)
Alkaline Phosphatase: 78 U/L (ref 38–126)
Anion gap: 15 (ref 5–15)
BUN: 84 mg/dL — ABNORMAL HIGH (ref 6–20)
CO2: 24 mmol/L (ref 22–32)
Calcium: 7.3 mg/dL — ABNORMAL LOW (ref 8.9–10.3)
Chloride: 92 mmol/L — ABNORMAL LOW (ref 98–111)
Creatinine, Ser: 7.76 mg/dL — ABNORMAL HIGH (ref 0.61–1.24)
GFR, Estimated: 9 mL/min — ABNORMAL LOW (ref >60)
Glucose, Bld: 79 mg/dL (ref 70–99)
Potassium: 3.4 mmol/L — ABNORMAL LOW (ref 3.5–5.1)
Sodium: 131 mmol/L — ABNORMAL LOW (ref 135–145)
Total Bilirubin: 0.7 mg/dL (ref 0.3–1.2)
Total Protein: 6.6 g/dL (ref 6.5–8.1)

## 2021-03-09 LAB — GLUCOSE, CAPILLARY
Glucose-Capillary: 71 mg/dL (ref 70–99)
Glucose-Capillary: 79 mg/dL (ref 70–99)
Glucose-Capillary: 81 mg/dL (ref 70–99)
Glucose-Capillary: 89 mg/dL (ref 70–99)

## 2021-03-09 LAB — CBC WITH DIFFERENTIAL/PLATELET
Abs Immature Granulocytes: 0.32 10*3/uL — ABNORMAL HIGH (ref 0.00–0.07)
Basophils Absolute: 0 10*3/uL (ref 0.0–0.1)
Basophils Relative: 1 %
Eosinophils Absolute: 0.5 10*3/uL (ref 0.0–0.5)
Eosinophils Relative: 15 %
HCT: 29.6 % — ABNORMAL LOW (ref 39.0–52.0)
Hemoglobin: 10 g/dL — ABNORMAL LOW (ref 13.0–17.0)
Immature Granulocytes: 9 %
Lymphocytes Relative: 26 %
Lymphs Abs: 0.9 10*3/uL (ref 0.7–4.0)
MCH: 29.6 pg (ref 26.0–34.0)
MCHC: 33.8 g/dL (ref 30.0–36.0)
MCV: 87.6 fL (ref 80.0–100.0)
Monocytes Absolute: 0.3 10*3/uL (ref 0.1–1.0)
Monocytes Relative: 8 %
Neutro Abs: 1.5 10*3/uL — ABNORMAL LOW (ref 1.7–7.7)
Neutrophils Relative %: 41 %
Platelets: 50 10*3/uL — ABNORMAL LOW (ref 150–400)
RBC: 3.38 MIL/uL — ABNORMAL LOW (ref 4.22–5.81)
RDW: 16.4 % — ABNORMAL HIGH (ref 11.5–15.5)
Smear Review: DECREASED
WBC: 3.5 10*3/uL — ABNORMAL LOW (ref 4.0–10.5)
nRBC: 0 % (ref 0.0–0.2)

## 2021-03-09 LAB — CULTURE, BLOOD (ROUTINE X 2)

## 2021-03-09 LAB — GLUCOSE 6 PHOSPHATE DEHYDROGENASE
G6PDH: 7.8 U/g{Hb} (ref 3.8–14.2)
Hemoglobin: 9.6 g/dL — ABNORMAL LOW (ref 13.0–17.7)

## 2021-03-09 MED ORDER — NIFEDIPINE ER OSMOTIC RELEASE 30 MG PO TB24
30.0000 mg | ORAL_TABLET | Freq: Every day | ORAL | Status: DC
  Administered 2021-03-09 – 2021-03-10 (×2): 30 mg via ORAL
  Filled 2021-03-09 (×2): qty 1

## 2021-03-09 MED ORDER — POTASSIUM CHLORIDE CRYS ER 20 MEQ PO TBCR
20.0000 meq | EXTENDED_RELEASE_TABLET | Freq: Two times a day (BID) | ORAL | Status: AC
  Administered 2021-03-09 (×2): 20 meq via ORAL
  Filled 2021-03-09 (×2): qty 1

## 2021-03-09 NOTE — Progress Notes (Signed)
Patient ID: Jacob Gutierrez, male   DOB: 19-Sep-1985, 35 y.o.   MRN: [id number] [facility] Progress Note   Assessment/ Plan:   1. Acute kidney Injury: With seemingly normal baseline renal function (creatinine 0.8-0.9).  Likely ATN associated with sepsis/relative hypotension and appears to be in the plateau phase now with robust urine output and essentially unchanged renal function overnight.  I will replace potassium, discontinue sodium bicarbonate and follow labs again tomorrow morning to assess for renal recovery. 2.  Hypertension: Midodrine was discontinued yesterday with rising blood pressures and today I will discontinue sodium bicarbonate drip and start him on low-dose nifedipine XL for blood pressure control. 3.  Group A streptococcus bacteremia/sepsis: On antibiotic therapy with cefadroxil after transition from Ancef/linezolid. 4.  HIV infection/AIDS: Poor adherence to antiretroviral therapy as an outpatient but based on palliative care discussion yesterday, apparently motivated to do better. 5.  Hyponatremia: Secondary to free water excretion defect in the setting of acute kidney injury.  Slightly better with brisk urine output.  Subjective:   Denies any acute complaints and specifically does not have any chest pain or shortness of breath.   Objective:   BP (!) 175/88 (BP Location: Right Leg)   Pulse 64   Temp 98.4 F (36.9 C) (Oral)   Resp 14   Wt 79.5 kg   SpO2 99%   BMI 23.12 kg/m   Intake/Output Summary (Last 24 hours) at 03/09/2021 0801 Last data filed at 03/09/2021 0600 Gross per 24 hour  Intake 1466.6 ml  Output 4490 ml  Net -3023.4 ml   Weight change: -2.8 kg  Physical Exam: Gen: Appears chronically ill, resting comfortably in bed CVS: Pulse regular rhythm, normal rate, S1 and S2 normal Resp: Anteriorly clear to auscultation bilaterally, no rales/rhonchi Abd: Soft, flat, nontender, bowel sounds normal Ext: Trace-1+ bilateral lower extremity  edema  Imaging: No results found.  Labs: BMET Recent Labs  Lab 03/04/21 0553 03/04/21 2220 03/05/21 0255 03/05/21 0526 03/05/21 1207 03/05/21 1824 03/06/21 0020 03/07/21 0229 03/08/21 1116 03/09/21 0304  NA 126* 127* 125* 127* 126* 124* 124* 124* 129* 131*  K 4.2 4.2 4.5  --   --   --  4.2 3.6 3.3* 3.4*  CL 95* 93* 94*  --   --   --  93* 93* 93* 92*  CO2 15* 19* 18*  --   --   --  18* 16* 23 24  GLUCOSE 55* 73 92  --   --   --  119* 91 75 79  BUN 41* 54* 56*  --   --   --  70* 82* 86* 84*  CREATININE 3.70* 5.04* 5.17*  --   --   --  6.24* 6.85* 7.77* 7.76*  CALCIUM 7.2* 6.9* 6.8*  --   --   --  6.8* 6.9* 6.9* 7.3*   CBC Recent Labs  Lab 03/06/21 0020 03/07/21 0229 03/08/21 1116 03/09/21 0304  WBC 4.0 4.8 3.9* 3.5*  NEUTROABS 2.7 2.7 1.7 1.5*  HGB 10.2* 8.8* 9.7* 10.0*  HCT 30.0* 25.9* 27.7* 29.6*  MCV 88.8 87.8 87.1 87.6  PLT 46* 47* 49* 50*    Medications:     cefadroxil  1 g Oral Q24H   Darunavir-Cobicisctat-Emtricitabine-Tenofovir Alafenamide  1 tablet Oral Q breakfast   nystatin  5 mL Mouth/Throat TID AC & HS   sodium chloride flush  10-40 mL Intracatheter Q12H   sulfamethoxazole-trimethoprim  1 tablet Oral Once per day on Mon Wed Fri  Zetta Bills, MD 03/09/2021, 8:01 AM

## 2021-03-09 NOTE — Progress Notes (Addendum)
PROGRESS NOTE    Jacob Gutierrez  GQQ:[id number] DOB: 1986-03-18 DOA: 03/03/2021 PCP: Patient, No Pcp Per (Inactive)   Brief Narrative:  35 year old male with past medical history of HIV/AIDS (CD4 < 35 09/2020), medication noncompliance, history of syphilis who presents to [facility] emergency department with complaints of nausea vomiting and worsening back pain starting approximately 2 days prior to admission. Known previous infections including syphilis, shigella, coccidioidomycosis, cryptosporidium in the setting of immunodeficiency and profound medication noncompliance.   Assessment & Plan:   Principal Problem:   Sepsis due to Escherichia coli (E. coli) (HCC) Active Problems:   Human immunodeficiency virus (HIV) disease (HCC)   Hyponatremia   Acute kidney injury (HCC)   Lactic acidosis   GERD (gastroesophageal reflux disease)   Pressure injury of skin   Group A streptococcal infection   Sepsis due to group A Streptococcus without acute organ dysfunction (HCC)   Sepsis due to Group A strep bacteremia Unlikely ongoing ESBL Escherichia coli (E. coli) (HCC) Oral thrush, POA -Recent history of recent ESBL E. coli pyelonephritis - Cultures remarkable for Group A Strep - ID consulted, appreciate insight and recommendations - De-escalate to cefazolin/linezolid to cefadroxil 1g for 10 day course(total) - 8/15 - 03/14/21 - Initiated nystatin swish and spit x5 days, follow clinically - Surgery signing off given no clear abdominal source and no need for procedure  AKI, ongoing, stabilizing Likely multifactorial-prerenal in the setting of poor p.o. intake -Nephrology following, appreciate insight and recommendations, likely multifactorial in the setting of hypotension, uncontrolled HIV, multifocal infections -Urine output continues to improve, increase p.o. intake more appropriate -IV fluids discontinued -Creatinine appears to be plateauing at this point around 7  Profound  lactic acidosis -Likely secondary to sepsis/AKI as above, downtrending appropriately transition off IV fluids   AIDS/Human immunodeficiency virus (HIV) disease (HCC) Recurrent profound medication noncompliance -Continue home HAART regimen Lab Results  Component Value Date   HIV1RNAQUANT 454,000 03/04/2021   Lab Results  Component Value Date   CD4TCELL 19 (L) 03/04/2021   CD4TABS <35 (L) 03/04/2021  -ID following - appreciate insight/recs  Hypoglycemia, profound, resolving -Patient does not carry diabetes diagnosis per chart review -A1c 5.9 -Off IV fluids, advance diet as tolerated  Hypovolemic hyponatremia -In the setting of above poor p.o. intake -Continue IV fluids    Noncompliance  -Patient has known medication noncompliance, follow-up noncompliance and generally does not adhere to medical advice or recommendations  Substance abuse  - UDS previously positive for amphetamines and THC  - Counseled toward cessation    DVT prophylaxis: Lovenox Code Status: Full Family Communication: Updated mother over the phone  Status is: Inpatient  Dispo: The patient is from: Home              Anticipated d/c is to: To be determined              Anticipated d/c date is: 48-72h              Patient currently not medically stable for discharge, awaiting further evaluation with infectious disease, PT evaluation, increase p.o. intake as well as resolution of  profound acute kidney injury  Consultants:  General surgery, infectious disease  Procedures:  None planned  Antimicrobials:  Meropenem  Subjective: No acute issues or events overnight per report, feels much better over the past 24h - urine output improving; tolerating p.o. today without any difficulty, requesting PT as he feels weak and has not been able to get out of bed  without assistance  Objective: Vitals:   03/08/21 0440 03/08/21 0900 03/09/21 0635 03/09/21 0658  BP: (!) 157/68 (!) 168/96  (!) 170/89  Pulse:  66     Resp: 15     Temp: (!) 97.5 F (36.4 C)   97.8 F (36.6 C)  TempSrc: Oral   Oral  SpO2: 98%   100%  Weight:   79.5 kg     Intake/Output Summary (Last 24 hours) at 03/09/2021 0718 Last data filed at 03/09/2021 0600 Gross per 24 hour  Intake 1466.6 ml  Output 4490 ml  Net -3023.4 ml    Filed Weights   03/03/21 2050 03/08/21 0400 03/09/21 0635  Weight: 79.4 kg 82.3 kg 79.5 kg    Examination:  General: No acute distress resting comfortably HEENT:  Normocephalic atraumatic.  Sclerae nonicteric, noninjected.  Extraocular movements intact bilaterally. Neck:  Without mass or deformity.  Trachea is midline. Lungs:  Clear to auscultate bilaterally without rhonchi, wheeze, or rales. Heart:  Regular rate and rhythm.  Without murmurs, rubs, or gallops. Abdomen:  Soft, nontender, nondistended.  Without guarding or rebound. Extremities: Without cyanosis, clubbing, edema, or obvious deformity. Vascular:  Dorsalis pedis and posterior tibial pulses palpable bilaterally. Skin:  Warm and dry, no erythema, no ulcerations.   Data Reviewed: I have personally reviewed following labs and imaging studies  CBC: Recent Labs  Lab 03/05/21 0255 03/06/21 0020 03/07/21 0229 03/08/21 1116 03/09/21 0304  WBC 4.0 4.0 4.8 3.9* 3.5*  NEUTROABS 2.9 2.7 2.7 1.7 1.5*  HGB 11.9* 10.2* 8.8* 9.7* 10.0*  HCT 34.7* 30.0* 25.9* 27.7* 29.6*  MCV 88.7 88.8 87.8 87.1 87.6  PLT 47* 46* 47* 49* 50*    Basic Metabolic Panel: Recent Labs  Lab 03/05/21 0255 03/05/21 0526 03/05/21 1824 03/06/21 0020 03/07/21 0229 03/08/21 1116 03/09/21 0304  NA 125*   < > 124* 124* 124* 129* 131*  K 4.5  --   --  4.2 3.6 3.3* 3.4*  CL 94*  --   --  93* 93* 93* 92*  CO2 18*  --   --  18* 16* 23 24  GLUCOSE 92  --   --  119* 91 75 79  BUN 56*  --   --  70* 82* 86* 84*  CREATININE 5.17*  --   --  6.24* 6.85* 7.77* 7.76*  CALCIUM 6.8*  --   --  6.8* 6.9* 6.9* 7.3*   < > = values in this interval not displayed.     GFR: Estimated Creatinine Clearance: 14.7 mL/min (A) (by C-G formula based on SCr of 7.76 mg/dL (H)). Liver Function Tests: Recent Labs  Lab 03/05/21 0255 03/06/21 0020 03/07/21 0229 03/08/21 1116 03/09/21 0304  AST 84* 45* 34 24 22  ALT 37 28 14 5  <5  ALKPHOS 68 70 90 77 78  BILITOT 3.4* 1.9* 0.9 0.9 0.7  PROT 6.2* 5.9* 5.8* 6.3* 6.6  ALBUMIN 1.7* 1.6* 1.6* 1.8* 1.8*    Recent Labs  Lab 03/03/21 1901  LIPASE 27    No results for input(s): AMMONIA in the last 168 hours. Coagulation Profile: Recent Labs  Lab 03/04/21 0553  INR 1.6*    Cardiac Enzymes: No results for input(s): CKTOTAL, CKMB, CKMBINDEX, TROPONINI in the last 168 hours. BNP (last 3 results) No results for input(s): PROBNP in the last 8760 hours. HbA1C: No results for input(s): HGBA1C in the last 72 hours.  CBG: Recent Labs  Lab 03/08/21 0701 03/08/21 1744 03/08/21 1830 03/09/21 0009 03/09/21  7096  GLUCAP 129* 56* 82 79 89    Lipid Profile: No results for input(s): CHOL, HDL, LDLCALC, TRIG, CHOLHDL, LDLDIRECT in the last 72 hours. Thyroid Function Tests: No results for input(s): TSH, T4TOTAL, FREET4, T3FREE, THYROIDAB in the last 72 hours. Anemia Panel: No results for input(s): VITAMINB12, FOLATE, FERRITIN, TIBC, IRON, RETICCTPCT in the last 72 hours. Sepsis Labs: Recent Labs  Lab 03/04/21 0553 03/04/21 1133 03/04/21 2245 03/05/21 0255  PROCALCITON >150.00  --   --   --   LATICACIDVEN 5.5* 4.3* 4.1* 3.4*     Recent Results (from the past 240 hour(s))  Blood Culture (routine x 2)     Status: Abnormal   Collection Time: 03/03/21  8:36 PM   Specimen: Site Not Specified; Blood  Result Value Ref Range Status   Specimen Description SITE NOT SPECIFIED  Final   Special Requests   Final    BOTTLES DRAWN AEROBIC ONLY Blood Culture results may not be optimal due to an inadequate volume of blood received in culture bottles   Culture  Setup Time   Final    GRAM POSITIVE COCCI IN  CHAINS AEROBIC BOTTLE ONLY CRITICAL RESULT CALLED TO, READ BACK BY AND VERIFIED WITH: PHARMD ALEX  L. [phone] FCP    Culture (A)  Final    STREPTOCOCCUS PYOGENES HEALTH DEPARTMENT NOTIFIED Performed at [facility], [address] [place], [place] [address]    Report Status 03/06/2021 FINAL  Final   Organism ID, Bacteria STREPTOCOCCUS PYOGENES  Final      Susceptibility   Streptococcus pyogenes - MIC*    PENICILLIN <=0.06 SENSITIVE Sensitive     CEFTRIAXONE <=0.12 SENSITIVE Sensitive     ERYTHROMYCIN >=8 RESISTANT Resistant     LEVOFLOXACIN 4 INTERMEDIATE Intermediate     VANCOMYCIN <=0.12 SENSITIVE Sensitive     * STREPTOCOCCUS PYOGENES  Blood Culture ID Panel (Reflexed)     Status: Abnormal   Collection Time: 03/03/21  8:36 PM  Result Value Ref Range Status   Enterococcus faecalis NOT DETECTED NOT DETECTED Final   Enterococcus Faecium NOT DETECTED NOT DETECTED Final   Listeria monocytogenes NOT DETECTED NOT DETECTED Final   Staphylococcus species NOT DETECTED NOT DETECTED Final   Staphylococcus aureus (BCID) NOT DETECTED NOT DETECTED Final   Staphylococcus epidermidis NOT DETECTED NOT DETECTED Final   Staphylococcus lugdunensis NOT DETECTED NOT DETECTED Final   Streptococcus species DETECTED (A) NOT DETECTED Final    Comment: CRITICAL RESULT CALLED TO, READ BACK BY AND VERIFIED WITH: PHARMD ALEX  L. [id number] FCP    Streptococcus agalactiae NOT DETECTED NOT DETECTED Final   Streptococcus pneumoniae NOT DETECTED NOT DETECTED Final   Streptococcus pyogenes DETECTED (A) NOT DETECTED Final    Comment: CRITICAL RESULT CALLED TO, READ BACK BY AND VERIFIED WITH: PHARMD ALEX  L. [phone] FCP    A.calcoaceticus-baumannii NOT DETECTED NOT DETECTED Final   Bacteroides fragilis NOT DETECTED NOT DETECTED Final   Enterobacterales NOT DETECTED NOT DETECTED Final   Enterobacter cloacae complex NOT DETECTED NOT DETECTED Final   Escherichia coli NOT DETECTED NOT DETECTED  Final   Klebsiella aerogenes NOT DETECTED NOT DETECTED Final   Klebsiella oxytoca NOT DETECTED NOT DETECTED Final   Klebsiella pneumoniae NOT DETECTED NOT DETECTED Final   Proteus species NOT DETECTED NOT DETECTED Final   Salmonella species NOT DETECTED NOT DETECTED Final   Serratia marcescens NOT DETECTED NOT DETECTED Final   Haemophilus influenzae NOT DETECTED NOT DETECTED Final  Neisseria meningitidis NOT DETECTED NOT DETECTED Final   Pseudomonas aeruginosa NOT DETECTED NOT DETECTED Final   Stenotrophomonas maltophilia NOT DETECTED NOT DETECTED Final   Candida albicans NOT DETECTED NOT DETECTED Final   Candida auris NOT DETECTED NOT DETECTED Final   Candida glabrata NOT DETECTED NOT DETECTED Final   Candida krusei NOT DETECTED NOT DETECTED Final   Candida parapsilosis NOT DETECTED NOT DETECTED Final   Candida tropicalis NOT DETECTED NOT DETECTED Final   Cryptococcus neoformans/gattii NOT DETECTED NOT DETECTED Final    Comment: Performed at [facility], [address], [place], [place] [address]  Blood Culture (routine x 2)     Status: Abnormal (Preliminary result)   Collection Time: 03/03/21  8:41 PM   Specimen: BLOOD  Result Value Ref Range Status   Specimen Description BLOOD RIGHT UPPER  Final   Special Requests   Final    BOTTLES DRAWN AEROBIC AND ANAEROBIC Blood Culture results may not be optimal due to an inadequate volume of blood received in culture bottles   Culture  Setup Time   Final    GRAM POSITIVE COCCI IN BOTH AEROBIC AND ANAEROBIC BOTTLES CRITICAL VALUE NOTED.  VALUE IS CONSISTENT WITH PREVIOUSLY REPORTED AND CALLED VALUE.    Culture (A)  Final    GROUP A STREP (S.PYOGENES) ISOLATED HEALTH DEPARTMENT NOTIFIED Performed at [facility], [address], [place], [place] [address]    Report Status PENDING  Incomplete  Resp Panel by RT-PCR (Flu A&B, Covid) Nasopharyngeal Swab     Status: None   Collection Time: 03/03/21 10:49 PM   Specimen:  Nasopharyngeal Swab; Nasopharyngeal(NP) swabs in vial transport medium  Result Value Ref Range Status   SARS Coronavirus 2 by RT PCR NEGATIVE NEGATIVE Final    Comment: (NOTE) SARS-CoV-2 target nucleic acids are NOT DETECTED.  The SARS-CoV-2 RNA is generally detectable in upper respiratory specimens during the acute phase of infection. The lowest concentration of SARS-CoV-2 viral copies this assay can detect is 138 copies/mL. A negative result does not preclude SARS-Cov-2 infection and should not be used as the sole basis for treatment or other patient management decisions. A negative result may occur with  improper specimen collection/handling, submission of specimen other than nasopharyngeal swab, presence of viral mutation(s) within the areas targeted by this assay, and inadequate number of viral copies(<138 copies/mL). A negative result must be combined with clinical observations, patient history, and epidemiological information. The expected result is Negative.  Fact Sheet for Patients:  BloggerCourse.com  Fact Sheet for Healthcare Providers:  SeriousBroker.it  This test is no t yet approved or cleared by the [place] FDA and  has been authorized for detection and/or diagnosis of SARS-CoV-2 by FDA under an Emergency Use Authorization (EUA). This EUA will remain  in effect (meaning this test can be used) for the duration of the COVID-19 declaration under Section 564(b)(1) of the Act, 21 U.S.C.section 360bbb-3(b)(1), unless the authorization is terminated  or revoked sooner.       Influenza A by PCR NEGATIVE NEGATIVE Final   Influenza B by PCR NEGATIVE NEGATIVE Final    Comment: (NOTE) The Xpert Xpress SARS-CoV-2/FLU/RSV plus assay is intended as an aid in the diagnosis of influenza from Nasopharyngeal swab specimens and should not be used as a sole basis for treatment. Nasal washings and aspirates are unacceptable for  Xpert Xpress SARS-CoV-2/FLU/RSV testing.  Fact Sheet for Patients: BloggerCourse.com  Fact Sheet for Healthcare Providers: SeriousBroker.it  This test is not  yet approved or cleared by the [place] FDA and has been authorized for detection and/or diagnosis of SARS-CoV-2 by FDA under an Emergency Use Authorization (EUA). This EUA will remain in effect (meaning this test can be used) for the duration of the COVID-19 declaration under Section 564(b)(1) of the Act, 21 U.S.C. section 360bbb-3(b)(1), unless the authorization is terminated or revoked.  Performed at [facility], [address] [place], [place] [address]   Urine Culture     Status: None   Collection Time: 03/04/21 10:19 PM   Specimen: Urine, Clean Catch  Result Value Ref Range Status   Specimen Description URINE, CLEAN CATCH  Final   Special Requests NONE  Final   Culture   Final    NO GROWTH Performed at [facility], [address] [place], [place] [address]    Report Status 03/08/2021 FINAL  Final  MRSA Next Gen by PCR, Nasal     Status: None   Collection Time: 03/05/21  1:26 AM   Specimen: Nasal Mucosa; Nasal Swab  Result Value Ref Range Status   MRSA by PCR Next Gen NOT DETECTED NOT DETECTED Final    Comment: (NOTE) The GeneXpert MRSA Assay (FDA approved for NASAL specimens only), is one component of a comprehensive MRSA colonization surveillance program. It is not intended to diagnose MRSA infection nor to guide or monitor treatment for MRSA infections. Test performance is not FDA approved in patients less than 37 years old. Performed at [facility], [address] [place], [place] [address]     Radiology Studies: No results found.  Scheduled Meds:  cefadroxil  1 g Oral Q24H   Darunavir-Cobicisctat-Emtricitabine-Tenofovir Alafenamide  1 tablet Oral Q breakfast   nystatin  5 mL Mouth/Throat TID AC & HS   sodium chloride  flush  10-40 mL Intracatheter Q12H   sulfamethoxazole-trimethoprim  1 tablet Oral Once per day on Mon Wed Fri   Continuous Infusions:  dextrose 20 mL/hr at 03/06/21 1905   sodium bicarbonate 150 mEq in D5W infusion 65 mL/hr at 03/08/21 1751     LOS: 5 days   Time spent: 35min  Azucena FallenWilliam C Kyrin Gratz, DO [facility]  If 7PM-7AM, please contact night-coverage [email or web address]  03/09/2021, 7:18 AM

## 2021-03-10 LAB — CBC WITH DIFFERENTIAL/PLATELET
Abs Immature Granulocytes: 0.38 10*3/uL — ABNORMAL HIGH (ref 0.00–0.07)
Basophils Absolute: 0 10*3/uL (ref 0.0–0.1)
Basophils Relative: 0 %
Eosinophils Absolute: 0.3 10*3/uL (ref 0.0–0.5)
Eosinophils Relative: 8 %
HCT: 29.5 % — ABNORMAL LOW (ref 39.0–52.0)
Hemoglobin: 9.9 g/dL — ABNORMAL LOW (ref 13.0–17.0)
Immature Granulocytes: 10 %
Lymphocytes Relative: 15 %
Lymphs Abs: 0.6 10*3/uL — ABNORMAL LOW (ref 0.7–4.0)
MCH: 29.8 pg (ref 26.0–34.0)
MCHC: 33.6 g/dL (ref 30.0–36.0)
MCV: 88.9 fL (ref 80.0–100.0)
Monocytes Absolute: 0.4 10*3/uL (ref 0.1–1.0)
Monocytes Relative: 12 %
Neutro Abs: 2 10*3/uL (ref 1.7–7.7)
Neutrophils Relative %: 55 %
Platelets: 56 10*3/uL — ABNORMAL LOW (ref 150–400)
RBC: 3.32 MIL/uL — ABNORMAL LOW (ref 4.22–5.81)
RDW: 16.4 % — ABNORMAL HIGH (ref 11.5–15.5)
WBC: 3.7 10*3/uL — ABNORMAL LOW (ref 4.0–10.5)
nRBC: 0 % (ref 0.0–0.2)

## 2021-03-10 LAB — COMPREHENSIVE METABOLIC PANEL
ALT: <5 U/L (ref 0–44)
AST: 18 U/L (ref 15–41)
Albumin: 1.9 g/dL — ABNORMAL LOW (ref 3.5–5.0)
Alkaline Phosphatase: 76 U/L (ref 38–126)
Anion gap: 10 (ref 5–15)
BUN: 74 mg/dL — ABNORMAL HIGH (ref 6–20)
CO2: 24 mmol/L (ref 22–32)
Calcium: 7.6 mg/dL — ABNORMAL LOW (ref 8.9–10.3)
Chloride: 98 mmol/L (ref 98–111)
Creatinine, Ser: 6.65 mg/dL — ABNORMAL HIGH (ref 0.61–1.24)
GFR, Estimated: 10 mL/min — ABNORMAL LOW (ref >60)
Glucose, Bld: 98 mg/dL (ref 70–99)
Potassium: 3.4 mmol/L — ABNORMAL LOW (ref 3.5–5.1)
Sodium: 132 mmol/L — ABNORMAL LOW (ref 135–145)
Total Bilirubin: 1.1 mg/dL (ref 0.3–1.2)
Total Protein: 7.2 g/dL (ref 6.5–8.1)

## 2021-03-10 LAB — GLUCOSE, CAPILLARY
Glucose-Capillary: 69 mg/dL — ABNORMAL LOW (ref 70–99)
Glucose-Capillary: 70 mg/dL (ref 70–99)
Glucose-Capillary: 90 mg/dL (ref 70–99)

## 2021-03-10 NOTE — TOC Initial Note (Signed)
Transition of Care [facility]) - Initial/Assessment Note    Patient Details  Name: Jacob Gutierrez MRN: [id number] Date of Birth: 12/20/1985  Transition of Care [facility]) CM/SW Contact:    Beckie Busing, RN Phone [phone]  03/10/2021, 2:59 PM  Clinical Narrative:                 [facility] consulted for high readmission patient. Patient is from home where he lives with a friend. Patient states that he has a PCP and is committed to regular follow ups. Patient reports that medications are affordable and that he does have access to medications.  Patient currently has no DME at home but would like a walker for ambulation. Currently has no home health services and feels safe in his current living environment. Patient states that he just wants to get better and feels that he has the appropriate resources to get better. There are no needs noted at this time. TOC will continue to follow.    Expected Discharge Plan: Home/Self Care Barriers to Discharge: Continued Medical Work up   Patient Goals and CMS Choice Patient states their goals for this hospitalization and ongoing recovery are:: AWants to get stronger CMS Medicare.gov Compare Post Acute Care list provided to::  (n/a) Choice offered to / list presented to :  (n/a)  Expected Discharge Plan and Services Expected Discharge Plan: Home/Self Care In-house Referral: Hospice / Palliative Care Discharge Planning Services: CM Consult   Living arrangements for the past 2 months: Single Family Home                 DME Arranged: N/A DME Agency: NA         HH Agency: NA        Prior Living Arrangements/Services Living arrangements for the past 2 months: Single Family Home Lives with:: Friends Patient language and need for interpreter reviewed:: Yes Do you feel safe going back to the place where you live?: Yes      Need for Family Participation in Patient Care: Yes (Comment) Care giver support system in place?: Yes (comment) Current home  services:  (n/a) Criminal Activity/Legal Involvement Pertinent to Current Situation/Hospitalization: No - Comment as needed  Activities of Daily Living      Permission Sought/Granted   Permission granted to share information with : No              Emotional Assessment Appearance:: Appears older than stated age Attitude/Demeanor/Rapport: Guarded Affect (typically observed): Flat Orientation: : Oriented to Self, Oriented to Place, Oriented to  Time, Oriented to Situation Alcohol / Substance Use: Not Applicable Psych Involvement: No (comment)  Admission diagnosis:  Acute cholecystitis [K81.0] Hyponatremia [E87.1] Proctitis [K62.89] RUQ pain [R10.11] History of ESBL E. coli infection [Z86.19] Septic shock (HCC) [A41.9, R65.21] Sepsis (HCC) [A41.9] Acute renal failure, unspecified acute renal failure type (HCC) [N17.9] HIV infection, unspecified symptom status (HCC) [B20] Patient Active Problem List   Diagnosis Date Noted   Pressure injury of skin 03/05/2021   Group A streptococcal infection    Sepsis due to group A Streptococcus without acute organ dysfunction (HCC)    Sepsis due to Escherichia coli (E. coli) (HCC) 03/04/2021   Lactic acidosis 03/04/2021   GERD (gastroesophageal reflux disease) 03/04/2021   ESBL (extended spectrum beta-lactamase) producing bacteria infection    Obstructive uropathy    Prerenal renal failure    Nonadherence to medical treatment    Acute kidney injury (HCC) 02/10/2021   Loose stools    Bilious  vomiting with nausea    SIRS (systemic inflammatory response syndrome) (HCC) 02/09/2021   Hypokalemia 10/15/2020   Nausea, vomiting, and diarrhea 10/15/2020   Colitis 10/15/2020   Dysphagia 10/15/2020   COVID-19 virus infection 08/25/2020   Dark urine 08/25/2020   Healthcare maintenance 07/23/2020   Pyelonephritis 02/13/2019   Prostatitis 10/05/2018   Leukopenia 09/20/2018   Hyponatremia 09/20/2018   Avoidance coping 09/07/2018   Fever  04/28/2018   Headache 04/27/2018   Condyloma 04/24/2018   Protein-calorie malnutrition, severe 12/12/2017   Personal history of MRSA (methicillin resistant Staphylococcus aureus) 12/09/2017   History of ESBL E. 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 [place] [address] Phone: [phone] Fax: [phone]  [facility] [address] [place] [address] Phone: [phone] Fax: [phone]     Social Determinants of Health (SDOH) Interventions    Readmission Risk Interventions Readmission Risk Prevention Plan 03/10/2021  Transportation Screening Complete  PCP or Specialist Appt within 3-5 Days Complete  HRI or Home Care Consult Complete  Social Work Consult for Recovery Care Planning/Counseling Complete  Palliative Care Screening Complete  Medication Review Oceanographer) Complete  Some recent data might be hidden

## 2021-03-10 NOTE — Progress Notes (Signed)
PROGRESS NOTE  Jacob Gutierrez HCW:[id number] DOB: March 25, 1986 DOA: 03/03/2021 PCP: Patient, No Pcp Per (Inactive)   LOS: 6 days   Brief Narrative / Interim history: 35 year old male with past medical history of HIV/AIDS (CD4 < 35 09/2020), medication noncompliance, history of syphilis who presents to [facility] emergency department with complaints of nausea vomiting and worsening back pain starting approximately 2 days prior to admission. Known previous infections including syphilis, shigella, coccidioidomycosis, cryptosporidium in the setting of immunodeficiency and profound medication noncompliance.  Subjective / 24h Interval events: Doing well, wants to eat breakfast  Assessment & Plan: Principal Problem Sepsis due to Group A strep bacteremia Unlikely ongoing ESBL Escherichia coli (E. coli) (HCC) Oral thrush, POA -Recent history of recent ESBL E. coli pyelonephritis -Cultures remarkable for Group A Strep -ID consulted, appreciate insight and recommendations -De-escalate to cefazolin/linezolid to cefadroxil 1g for 10 day course(total) - 8/15 - 03/14/21 -Initiated nystatin swish and spit x5 days, follow clinically -Surgery signing off given no clear abdominal source and no need for procedure  Active Problems AKI, ongoing, stabilizing Likely multifactorial-prerenal in the setting of poor p.o. intake -Nephrology following, appreciate insight and recommendations, likely multifactorial in the setting of hypotension, uncontrolled HIV, multifocal infections -Good urine output, creatinine improving to 6 today.  Appreciate nephrology follow-up   Profound lactic acidosis -Resolved   AIDS/Human immunodeficiency virus (HIV) disease (HCC) Recurrent profound medication noncompliance -Continue home HAART regimen, he is noncompliant at home   Hypoglycemia, profound, resolving -Hypoglycemia resolved   Hypovolemic hyponatremia -In the setting of above poor p.o. intake    Noncompliance  -Patient has known medication noncompliance, follow-up noncompliance and generally does not adhere to medical advice or recommendations   Substance abuse  - UDS previously positive for amphetamines and THC  - Counseled toward cessation    Scheduled Meds:  cefadroxil  1 g Oral Q24H   Darunavir-Cobicisctat-Emtricitabine-Tenofovir Alafenamide  1 tablet Oral Q breakfast   NIFEdipine  30 mg Oral Daily   nystatin  5 mL Mouth/Throat TID AC & HS   sodium chloride flush  10-40 mL Intracatheter Q12H   sulfamethoxazole-trimethoprim  1 tablet Oral Once per day on Mon Wed Fri   Continuous Infusions:  dextrose 20 mL/hr at 03/06/21 1905   PRN Meds:.acetaminophen **OR** acetaminophen, lip balm, magic mouthwash w/lidocaine, ondansetron **OR** ondansetron (ZOFRAN) IV, phenol, sodium chloride flush  Diet Orders (From admission, onward)     Start     Ordered   03/08/21 0902  Diet regular Room service appropriate? Yes; Fluid consistency: Thin  Diet effective now       Question Answer Comment  Room service appropriate? Yes   Fluid consistency: Thin      03/08/21 0901            DVT prophylaxis: Place and maintain sequential compression device Start: 03/06/21 0748     Code Status: Full Code  Family Communication: No family at bedside  Status is: Inpatient  Remains inpatient appropriate because:Inpatient level of care appropriate due to severity of illness  Dispo: The patient is from: Home              Anticipated d/c is to: Home              Patient currently is not medically stable to d/c.   Difficult to place patient No   Level of care: Progressive  Consultants:  Nephrology  ID  Procedures:  none  Microbiology  Strep pyogenes 8/10 blood cultures Blood cultures 8/15-no  growth  Antimicrobials: Cefadroxil   Objective: Vitals:   03/08/21 0900 03/09/21 0635 03/09/21 0658 03/09/21 0742  BP: (!) 168/96  (!) 170/89 (!) 175/88  Pulse: 66   64  Resp:    14   Temp:   97.8 F (36.6 C) 98.4 F (36.9 C)  TempSrc:   Oral Oral  SpO2:   100% 99%  Weight:  79.5 kg      Intake/Output Summary (Last 24 hours) at 03/10/2021 1340 Last data filed at 03/10/2021 0830 Gross per 24 hour  Intake 360 ml  Output 450 ml  Net -90 ml   Filed Weights   03/03/21 2050 03/08/21 0400 03/09/21 0635  Weight: 79.4 kg 82.3 kg 79.5 kg    Examination:  Constitutional: No distress Eyes: no scleral icterus ENMT: Mucous membranes are moist.  Neck: normal, supple Respiratory: clear to auscultation bilaterally, no wheezing, no crackles. Normal respiratory effort.  Cardiovascular: Regular rate and rhythm, no murmurs / rubs / gallops. No LE edema. Good peripheral pulses Abdomen: non distended, no tenderness. Bowel sounds positive.  Musculoskeletal: no clubbing / cyanosis.  Skin: no rashes Neurologic: CN 2-12 grossly intact. Strength 5/5 in all 4.   Data Reviewed: I have independently reviewed following labs and imaging studies  CBC: Recent Labs  Lab 03/06/21 0020 03/07/21 0229 03/08/21 1116 03/09/21 0304 03/10/21 0952  WBC 4.0 4.8 3.9* 3.5* 3.7*  NEUTROABS 2.7 2.7 1.7 1.5* 2.0  HGB 10.2* 8.8* 9.7*  9.6* 10.0* 9.9*  HCT 30.0* 25.9* 27.7* 29.6* 29.5*  MCV 88.8 87.8 87.1 87.6 88.9  PLT 46* 47* 49* 50* 56*   Basic Metabolic Panel: Recent Labs  Lab 03/06/21 0020 03/07/21 0229 03/08/21 1116 03/09/21 0304 03/10/21 0952  NA 124* 124* 129* 131* 132*  K 4.2 3.6 3.3* 3.4* 3.4*  CL 93* 93* 93* 92* 98  CO2 18* 16* 23 24 24   GLUCOSE 119* 91 75 79 98  BUN 70* 82* 86* 84* 74*  CREATININE 6.24* 6.85* 7.77* 7.76* 6.65*  CALCIUM 6.8* 6.9* 6.9* 7.3* 7.6*   Liver Function Tests: Recent Labs  Lab 03/06/21 0020 03/07/21 0229 03/08/21 1116 03/09/21 0304 03/10/21 0952  AST 45* 34 24 22 18   ALT 28 14 5  <5 <5  ALKPHOS 70 90 77 78 76  BILITOT 1.9* 0.9 0.9 0.7 1.1  PROT 5.9* 5.8* 6.3* 6.6 7.2  ALBUMIN 1.6* 1.6* 1.8* 1.8* 1.9*   Coagulation Profile: Recent  Labs  Lab 03/04/21 0553  INR 1.6*   HbA1C: No results for input(s): HGBA1C in the last 72 hours. CBG: Recent Labs  Lab 03/09/21 0633 03/09/21 1243 03/09/21 2006 03/10/21 0000 03/10/21 0412  GLUCAP 89 81 71 69* 70    Recent Results (from the past 240 hour(s))  Blood Culture (routine x 2)     Status: Abnormal   Collection Time: 03/03/21  8:36 PM   Specimen: Site Not Specified; Blood  Result Value Ref Range Status   Specimen Description SITE NOT SPECIFIED  Final   Special Requests   Final    BOTTLES DRAWN AEROBIC ONLY Blood Culture results may not be optimal due to an inadequate volume of blood received in culture bottles   Culture  Setup Time   Final    GRAM POSITIVE COCCI IN CHAINS AEROBIC BOTTLE ONLY CRITICAL RESULT CALLED TO, READ BACK BY AND VERIFIED WITH: PHARMD ALEX  L. 03/12/21 FCP    Culture (A)  Final    STREPTOCOCCUS PYOGENES HEALTH DEPARTMENT NOTIFIED Performed at [facility], [address] [place], [place] [address]    Report Status 03/06/2021 FINAL  Final   Organism ID, Bacteria STREPTOCOCCUS PYOGENES  Final      Susceptibility   Streptococcus pyogenes - MIC*    PENICILLIN <=0.06 SENSITIVE Sensitive     CEFTRIAXONE <=0.12 SENSITIVE Sensitive     ERYTHROMYCIN >=8 RESISTANT Resistant     LEVOFLOXACIN 4 INTERMEDIATE Intermediate     VANCOMYCIN <=0.12 SENSITIVE Sensitive     * STREPTOCOCCUS PYOGENES  Blood Culture ID Panel (Reflexed)     Status: Abnormal   Collection Time: 03/03/21  8:36 PM  Result Value Ref Range Status   Enterococcus faecalis NOT DETECTED NOT DETECTED Final   Enterococcus Faecium NOT DETECTED NOT DETECTED Final   Listeria monocytogenes NOT DETECTED NOT DETECTED Final   Staphylococcus species NOT DETECTED NOT DETECTED Final   Staphylococcus aureus (BCID) NOT DETECTED NOT DETECTED Final   Staphylococcus epidermidis NOT DETECTED NOT DETECTED Final   Staphylococcus lugdunensis NOT DETECTED NOT DETECTED Final   Streptococcus  species DETECTED (A) NOT DETECTED Final    Comment: CRITICAL RESULT CALLED TO, READ BACK BY AND VERIFIED WITH: PHARMD ALEX  L. [id number] FCP    Streptococcus agalactiae NOT DETECTED NOT DETECTED Final   Streptococcus pneumoniae NOT DETECTED NOT DETECTED Final   Streptococcus pyogenes DETECTED (A) NOT DETECTED Final    Comment: CRITICAL RESULT CALLED TO, READ BACK BY AND VERIFIED WITH: PHARMD ALEX  L. [id number] FCP    A.calcoaceticus-baumannii NOT DETECTED NOT DETECTED Final   Bacteroides fragilis NOT DETECTED NOT DETECTED Final   Enterobacterales NOT DETECTED NOT DETECTED Final   Enterobacter cloacae complex NOT DETECTED NOT DETECTED Final   Escherichia coli NOT DETECTED NOT DETECTED Final   Klebsiella aerogenes NOT DETECTED NOT DETECTED Final   Klebsiella oxytoca NOT DETECTED NOT DETECTED Final   Klebsiella pneumoniae NOT DETECTED NOT DETECTED Final   Proteus species NOT DETECTED NOT DETECTED Final   Salmonella species NOT DETECTED NOT DETECTED Final   Serratia marcescens NOT DETECTED NOT DETECTED Final   Haemophilus influenzae NOT DETECTED NOT DETECTED Final   Neisseria meningitidis NOT DETECTED NOT DETECTED Final   Pseudomonas aeruginosa NOT DETECTED NOT DETECTED Final   Stenotrophomonas maltophilia NOT DETECTED NOT DETECTED Final   Candida albicans NOT DETECTED NOT DETECTED Final   Candida auris NOT DETECTED NOT DETECTED Final   Candida glabrata NOT DETECTED NOT DETECTED Final   Candida krusei NOT DETECTED NOT DETECTED Final   Candida parapsilosis NOT DETECTED NOT DETECTED Final   Candida tropicalis NOT DETECTED NOT DETECTED Final   Cryptococcus neoformans/gattii NOT DETECTED NOT DETECTED Final    Comment: Performed at [facility], [address] [place], [place] [address]  Blood Culture (routine x 2)     Status: Abnormal   Collection Time: 03/03/21  8:41 PM   Specimen: BLOOD  Result Value Ref Range Status   Specimen Description BLOOD RIGHT UPPER  Final   Special  Requests   Final    BOTTLES DRAWN AEROBIC AND ANAEROBIC Blood Culture results may not be optimal due to an inadequate volume of blood received in culture bottles   Culture  Setup Time   Final    GRAM POSITIVE COCCI IN BOTH AEROBIC AND ANAEROBIC BOTTLES CRITICAL VALUE NOTED.  VALUE IS CONSISTENT WITH PREVIOUSLY REPORTED AND CALLED VALUE.    Culture (A)  Final    STREPTOCOCCUS PYOGENES HEALTH DEPARTMENT NOTIFIED SUSCEPTIBILITIES PERFORMED ON PREVIOUS CULTURE WITHIN THE  LAST 5 DAYS. Performed at [facility], [address] [place], [place] [address]    Report Status 03/09/2021 FINAL  Final  Resp Panel by RT-PCR (Flu A&B, Covid) Nasopharyngeal Swab     Status: None   Collection Time: 03/03/21 10:49 PM   Specimen: Nasopharyngeal Swab; Nasopharyngeal(NP) swabs in vial transport medium  Result Value Ref Range Status   SARS Coronavirus 2 by RT PCR NEGATIVE NEGATIVE Final    Comment: (NOTE) SARS-CoV-2 target nucleic acids are NOT DETECTED.  The SARS-CoV-2 RNA is generally detectable in upper respiratory specimens during the acute phase of infection. The lowest concentration of SARS-CoV-2 viral copies this assay can detect is 138 copies/mL. A negative result does not preclude SARS-Cov-2 infection and should not be used as the sole basis for treatment or other patient management decisions. A negative result may occur with  improper specimen collection/handling, submission of specimen other than nasopharyngeal swab, presence of viral mutation(s) within the areas targeted by this assay, and inadequate number of viral copies(<138 copies/mL). A negative result must be combined with clinical observations, patient history, and epidemiological information. The expected result is Negative.  Fact Sheet for Patients:  BloggerCourse.com  Fact Sheet for Healthcare Providers:  SeriousBroker.it  This test is no t yet approved or cleared by the  [place] FDA and  has been authorized for detection and/or diagnosis of SARS-CoV-2 by FDA under an Emergency Use Authorization (EUA). This EUA will remain  in effect (meaning this test can be used) for the duration of the COVID-19 declaration under Section 564(b)(1) of the Act, 21 U.S.C.section 360bbb-3(b)(1), unless the authorization is terminated  or revoked sooner.       Influenza A by PCR NEGATIVE NEGATIVE Final   Influenza B by PCR NEGATIVE NEGATIVE Final    Comment: (NOTE) The Xpert Xpress SARS-CoV-2/FLU/RSV plus assay is intended as an aid in the diagnosis of influenza from Nasopharyngeal swab specimens and should not be used as a sole basis for treatment. Nasal washings and aspirates are unacceptable for Xpert Xpress SARS-CoV-2/FLU/RSV testing.  Fact Sheet for Patients: BloggerCourse.com  Fact Sheet for Healthcare Providers: SeriousBroker.it  This test is not yet approved or cleared by the [place] FDA and has been authorized for detection and/or diagnosis of SARS-CoV-2 by FDA under an Emergency Use Authorization (EUA). This EUA will remain in effect (meaning this test can be used) for the duration of the COVID-19 declaration under Section 564(b)(1) of the Act, 21 U.S.C. section 360bbb-3(b)(1), unless the authorization is terminated or revoked.  Performed at [facility], [address] [place], [place] [address]   Urine Culture     Status: None   Collection Time: 03/04/21 10:19 PM   Specimen: Urine, Clean Catch  Result Value Ref Range Status   Specimen Description URINE, CLEAN CATCH  Final   Special Requests NONE  Final   Culture   Final    NO GROWTH Performed at [facility], [address] [place], [place] [address]    Report Status 03/08/2021 FINAL  Final  MRSA Next Gen by PCR, Nasal     Status: None   Collection Time: 03/05/21  1:26 AM   Specimen: Nasal Mucosa; Nasal Swab  Result  Value Ref Range Status   MRSA by PCR Next Gen NOT DETECTED NOT DETECTED Final    Comment: (NOTE) The GeneXpert MRSA Assay (FDA approved for NASAL specimens only), is one component of a comprehensive MRSA colonization surveillance program. It  is not intended to diagnose MRSA infection nor to guide or monitor treatment for MRSA infections. Test performance is not FDA approved in patients less than 53 years old. Performed at [facility], [address] [place], [place] [address]   Culture, blood (routine x 2)     Status: None (Preliminary result)   Collection Time: 03/08/21 11:16 AM   Specimen: BLOOD RIGHT HAND  Result Value Ref Range Status   Specimen Description BLOOD RIGHT HAND  Final   Special Requests   Final    BOTTLES DRAWN AEROBIC ONLY Blood Culture results may not be optimal due to an inadequate volume of blood received in culture bottles   Culture   Final    NO GROWTH 2 DAYS Performed at [facility], [address] [place], [place] [address]    Report Status PENDING  Incomplete  Culture, blood (routine x 2)     Status: None (Preliminary result)   Collection Time: 03/08/21 11:32 AM   Specimen: BLOOD RIGHT HAND  Result Value Ref Range Status   Specimen Description BLOOD RIGHT HAND  Final   Special Requests   Final    BOTTLES DRAWN AEROBIC ONLY Blood Culture results may not be optimal due to an inadequate volume of blood received in culture bottles   Culture   Final    NO GROWTH 2 DAYS Performed at [facility], [address], [place], [place] [address]    Report Status PENDING  Incomplete     Radiology Studies: No results found.  Pamella Pert, MD, PhD [facility]  Between 7 am - 7 pm I am available, please contact me via Amion (for emergencies) or Securechat (non urgent messages)  Between 7 pm - 7 am I am not available, please contact night coverage MD/APP via Amion

## 2021-03-10 NOTE — Progress Notes (Signed)
Patient ID: Jacob Gutierrez, male   DOB: 1986-06-29, 35 y.o.   MRN: [id number] [facility] Progress Note   Assessment/ Plan:   1. Acute kidney Injury: With seemingly normal baseline renal function (creatinine 0.8-0.9).  Likely ATN associated with sepsis/relative hypotension and his previous labs/creatinine indicated that he may be in the plateau phase with fixed creatinine and improving urine output.  Awaiting labs from this morning to assess for potential renal recovery. 2.  Hypertension: Started yesterday on nifedipine XL with discontinuation of sodium bicarbonate drip-increase nifedipine dose. 3.  Group A streptococcus bacteremia/sepsis: On antibiotic therapy with cefadroxil after transition from Ancef/linezolid. 4.  HIV infection/AIDS: Poor adherence to antiretroviral therapy as an outpatient but based on palliative care discussion earlier this admission, apparently motivated to do better. 5.  Hyponatremia: Secondary to free water excretion defect in the setting of acute kidney injury and appears to have been improving on labs yesterday-monitor again today.  Subjective:   Complains of dry mouth/thirst this morning and would like some ice chips.  Denies chest pain or shortness of breath.   Objective:   BP (!) 175/88 (BP Location: Right Leg)   Pulse 64   Temp 98.4 F (36.9 C) (Oral)   Resp 14   Wt 79.5 kg   SpO2 99%   BMI 23.12 kg/m   Intake/Output Summary (Last 24 hours) at 03/10/2021 0739 Last data filed at 03/09/2021 2032 Gross per 24 hour  Intake 0 ml  Output 1100 ml  Net -1100 ml   Weight change:   Physical Exam: Gen: Appears chronically ill, resting comfortably in bed CVS: Pulse regular rhythm, normal rate, S1 and S2 normal Resp: Anteriorly clear to auscultation bilaterally, no rales/rhonchi Abd: Soft, flat, nontender, bowel sounds normal Ext: Trace-1+ bilateral lower extremity edema  Imaging: No results found.  Labs: BMET Recent Labs  Lab  03/04/21 0553 03/04/21 2220 03/05/21 0255 03/05/21 0526 03/05/21 1207 03/05/21 1824 03/06/21 0020 03/07/21 0229 03/08/21 1116 03/09/21 0304  NA 126* 127* 125* 127* 126* 124* 124* 124* 129* 131*  K 4.2 4.2 4.5  --   --   --  4.2 3.6 3.3* 3.4*  CL 95* 93* 94*  --   --   --  93* 93* 93* 92*  CO2 15* 19* 18*  --   --   --  18* 16* 23 24  GLUCOSE 55* 73 92  --   --   --  119* 91 75 79  BUN 41* 54* 56*  --   --   --  70* 82* 86* 84*  CREATININE 3.70* 5.04* 5.17*  --   --   --  6.24* 6.85* 7.77* 7.76*  CALCIUM 7.2* 6.9* 6.8*  --   --   --  6.8* 6.9* 6.9* 7.3*   CBC Recent Labs  Lab 03/06/21 0020 03/07/21 0229 03/08/21 1116 03/09/21 0304  WBC 4.0 4.8 3.9* 3.5*  NEUTROABS 2.7 2.7 1.7 1.5*  HGB 10.2* 8.8* 9.7*  9.6* 10.0*  HCT 30.0* 25.9* 27.7* 29.6*  MCV 88.8 87.8 87.1 87.6  PLT 46* 47* 49* 50*    Medications:     cefadroxil  1 g Oral Q24H   Darunavir-Cobicisctat-Emtricitabine-Tenofovir Alafenamide  1 tablet Oral Q breakfast   NIFEdipine  30 mg Oral Daily   nystatin  5 mL Mouth/Throat TID AC & HS   sodium chloride flush  10-40 mL Intracatheter Q12H   sulfamethoxazole-trimethoprim  1 tablet Oral Once per day on Mon Wed Fri  Zetta Bills, MD 03/10/2021, 7:39 AM

## 2021-03-11 ENCOUNTER — Other Ambulatory Visit (HOSPITAL_COMMUNITY): Payer: Self-pay

## 2021-03-11 LAB — CBC WITH DIFFERENTIAL/PLATELET
Abs Immature Granulocytes: 0.43 10*3/uL — ABNORMAL HIGH (ref 0.00–0.07)
Basophils Absolute: 0 10*3/uL (ref 0.0–0.1)
Basophils Relative: 1 %
Eosinophils Absolute: 0.3 10*3/uL (ref 0.0–0.5)
Eosinophils Relative: 6 %
HCT: 32.6 % — ABNORMAL LOW (ref 39.0–52.0)
Hemoglobin: 10.8 g/dL — ABNORMAL LOW (ref 13.0–17.0)
Immature Granulocytes: 10 %
Lymphocytes Relative: 14 %
Lymphs Abs: 0.6 10*3/uL — ABNORMAL LOW (ref 0.7–4.0)
MCH: 29.9 pg (ref 26.0–34.0)
MCHC: 33.1 g/dL (ref 30.0–36.0)
MCV: 90.3 fL (ref 80.0–100.0)
Monocytes Absolute: 0.5 10*3/uL (ref 0.1–1.0)
Monocytes Relative: 11 %
Neutro Abs: 2.4 10*3/uL (ref 1.7–7.7)
Neutrophils Relative %: 58 %
Platelets: 61 10*3/uL — ABNORMAL LOW (ref 150–400)
RBC: 3.61 MIL/uL — ABNORMAL LOW (ref 4.22–5.81)
RDW: 16.4 % — ABNORMAL HIGH (ref 11.5–15.5)
WBC: 4.4 10*3/uL (ref 4.0–10.5)
nRBC: 0 % (ref 0.0–0.2)

## 2021-03-11 LAB — COMPREHENSIVE METABOLIC PANEL
ALT: <5 U/L (ref 0–44)
AST: 21 U/L (ref 15–41)
Albumin: 2.2 g/dL — ABNORMAL LOW (ref 3.5–5.0)
Alkaline Phosphatase: 90 U/L (ref 38–126)
Anion gap: 11 (ref 5–15)
BUN: 64 mg/dL — ABNORMAL HIGH (ref 6–20)
CO2: 22 mmol/L (ref 22–32)
Calcium: 7.9 mg/dL — ABNORMAL LOW (ref 8.9–10.3)
Chloride: 98 mmol/L (ref 98–111)
Creatinine, Ser: 5.79 mg/dL — ABNORMAL HIGH (ref 0.61–1.24)
GFR, Estimated: 12 mL/min — ABNORMAL LOW (ref >60)
Glucose, Bld: 76 mg/dL (ref 70–99)
Potassium: 3.8 mmol/L (ref 3.5–5.1)
Sodium: 131 mmol/L — ABNORMAL LOW (ref 135–145)
Total Bilirubin: 0.9 mg/dL (ref 0.3–1.2)
Total Protein: 8.1 g/dL (ref 6.5–8.1)

## 2021-03-11 LAB — GLUCOSE, CAPILLARY: Glucose-Capillary: 111 mg/dL — ABNORMAL HIGH (ref 70–99)

## 2021-03-11 MED ORDER — NIFEDIPINE ER OSMOTIC RELEASE 30 MG PO TB24
60.0000 mg | ORAL_TABLET | Freq: Every day | ORAL | Status: DC
  Administered 2021-03-11: 60 mg via ORAL
  Filled 2021-03-11: qty 2

## 2021-03-11 MED ORDER — SULFAMETHOXAZOLE-TRIMETHOPRIM 800-160 MG PO TABS
1.0000 | ORAL_TABLET | ORAL | 0 refills | Status: DC
  Filled 2021-03-11: qty 30, 70d supply, fill #0

## 2021-03-11 MED ORDER — CEFADROXIL 500 MG PO CAPS
1.0000 g | ORAL_CAPSULE | ORAL | 0 refills | Status: AC
  Filled 2021-03-11: qty 8, 4d supply, fill #0

## 2021-03-11 MED ORDER — NIFEDIPINE ER 60 MG PO TB24
60.0000 mg | ORAL_TABLET | Freq: Every day | ORAL | 0 refills | Status: DC
  Filled 2021-03-11: qty 30, 30d supply, fill #0

## 2021-03-11 MED ORDER — SYMTUZA 800-150-200-10 MG PO TABS
1.0000 | ORAL_TABLET | Freq: Every day | ORAL | 0 refills | Status: DC
Start: 2021-03-11 — End: 2021-03-30
  Filled 2021-03-11: qty 30, 30d supply, fill #0

## 2021-03-11 NOTE — Progress Notes (Signed)
Physical Therapy Evaluation and D/C Patient Details Name: Jacob Gutierrez MRN: [id number] DOB: February 10, 1986 Today's Date: 03/11/2021   History of Present Illness  35 year old male admitted 8/10  who presents to [facility] emergency department with complaints of nausea vomiting and back pain. Of note, patient was recently hospitalized at [facility] from 7/19 until 7/25.  During that hospitalization patient was treated for sepsis secondary to pyelonephritis secondary to ESBL E. coli.  Presentation was complicated by associated acute kidney injury  PMH:  HIV/AIDS (CD4 < 35 09/2020), medication noncompliance, history of syphilis  Clinical Impression  Pt admitted with above diagnosis. Pt has been ambulating in room with Modif I per pt. Pt states he feels safe with RW and that he will go to hotel for a week or so and then return to live with a friend.  Pt independent with bed mobility and transfers and Modif I with ambulation. STrength WFL.  Does not need f/u therapy and RW has been ordered and delivered. No further skilled PT needs.        Follow Up Recommendations No PT follow up    Equipment Recommendations  Rolling walker with 5" wheels    Recommendations for Other Services       Precautions / Restrictions Precautions Precautions: Fall Restrictions Weight Bearing Restrictions: No      Mobility  Bed Mobility Overal bed mobility: Independent                  Transfers Overall transfer level: Independent                  Ambulation/Gait Ambulation/Gait assistance: Modified independent (Device/Increase time) Gait Distance (Feet): 30 Feet Assistive device: Rolling walker (2 wheeled)       General Gait Details: Pt reports he has been using walker to go back and forth to bathroom and it keeps him steady.  Declined to show therapist. MD has ordered a RW for pt.  Stairs            Wheelchair Mobility    Modified Rankin (Stroke Patients Only)        Balance Overall balance assessment: Mild deficits observed, not formally tested                                           Pertinent Vitals/Pain Pain Assessment: No/denies pain    Home Living Family/patient expects to be discharged to:: Private residence Living Arrangements: Alone Available Help at Discharge:  [facility] for a week,normallycares for someone)   Home Access: Level entry     Home Layout: One level Home Equipment: Environmental consultant - 2 wheels Additional Comments: House he was at caring for friend has 1 step    Prior Function Level of Independence: Independent;Needs assistance         Comments: on disability,pt statesheliveswithsomeoneandcooksandcleanfor them     Hand Dominance   Dominant Hand: Right    Extremity/Trunk Assessment   Upper Extremity Assessment Upper Extremity Assessment: Overall WFL for tasks assessed    Lower Extremity Assessment Lower Extremity Assessment: Overall WFL for tasks assessed    Cervical / Trunk Assessment Cervical / Trunk Assessment: Normal  Communication   Communication: No difficulties  Cognition Arousal/Alertness: Awake/alert Behavior During Therapy: WFL for tasks assessed/performed Overall Cognitive Status: Within Functional Limits for tasks assessed  General Comments      Exercises     Assessment/Plan    PT Assessment Patient needs continued PT services  PT Problem List Decreased activity tolerance;Decreased balance;Decreased mobility;Decreased knowledge of use of DME;Decreased safety awareness;Decreased knowledge of precautions       PT Treatment Interventions DME instruction;Gait training;Functional mobility training;Therapeutic activities;Therapeutic exercise;Balance training;Patient/family education    PT Goals (Current goals can be found in the Care Plan section)  Acute Rehab PT Goals Patient Stated Goal: to go home today PT Goal  Formulation: All assessment and education complete, DC therapy    Frequency Min 3X/week   Barriers to discharge        Co-evaluation               AM-PAC PT "6 Clicks" Mobility  Outcome Measure Help needed turning from your back to your side while in a flat bed without using bedrails?: None Help needed moving from lying on your back to sitting on the side of a flat bed without using bedrails?: None Help needed moving to and from a bed to a chair (including a wheelchair)?: None Help needed standing up from a chair using your arms (e.g., wheelchair or bedside chair)?: None Help needed to walk in hospital room?: A Little Help needed climbing 3-5 steps with a railing? : A Little 6 Click Score: 22    End of Session   Activity Tolerance: Patient tolerated treatment well Patient left: in bed;with call bell/phone within reach Nurse Communication: Mobility status PT Visit Diagnosis: Muscle weakness (generalized) (M62.81)    Time: 0165-5374 PT Time Calculation (min) (ACUTE ONLY): 9 min   Charges:   PT Evaluation $PT Eval Low Complexity: 1 Low          Aubriana Ravelo M,PT Acute Rehab Services [phone] (pager)   Bevelyn Buckles 03/11/2021, 1:08 PM

## 2021-03-11 NOTE — Discharge Summary (Signed)
Physician Discharge Summary  Jacob Gutierrez OZH:[id number] DOB: 1986/02/04 DOA: 03/03/2021  PCP: Patient, No Pcp Per (Inactive)  Admit date: 03/03/2021 Discharge date: 03/11/2021  Admitted From: home Disposition:  home  Recommendations for Outpatient Follow-up:  Follow up with PCP in 1-2 weeks Follow-up with infectious disease in 1 to 2 weeks  Home Health: none Equipment/Devices: none  Discharge Condition: stable CODE STATUS: Full code Diet recommendation: regular  HPI: Per admitting MD, 35 year old male with past medical history of HIV/AIDS (CD4 < 35 09/2020), medication noncompliance, history of syphilis who presents to [facility] emergency department with complaints of nausea vomiting and back pain. Of note, patient was recently hospitalized at [facility] from 7/19 until 7/25.  During that hospitalization patient was treated for sepsis secondary to pyelonephritis secondary to ESBL E. coli.  Presentation was complicated by associated acute kidney injury.  Patient was treated with 5 days of intravenous meropenem as well as intravenous fluids.  Acute kidney injury resolved, patient clinically improved.  Patient was additionally treated with fluconazole for observed thrush.  Patient was discharged home on 7/25.  Patient explains that he has felt at or near his baseline since discharge up until approximately 2 days ago.  At that point patient began to experience back pain.  Patient describes the back pain as moderate to severe in intensity, sharp in quality and located in the low back.  Pain is nonradiating.  Pain is been associated with severe nausea with frequent bouts of vomiting.  Patient additionally has had difficulty tolerating any oral intake over the span of time.  Patient is also been experiencing progressively worsening generalized weakness and bouts of diaphoresis. Patient denies sick contacts recent travel or contact with confirmed COVID-19 infection.  Patient  once again endorses being noncompliant with his antiretroviral therapy. Due to patient's progressively worsening nausea vomiting and generalized weakness EMS was contacted who promptly came to evaluate the patient and brought him into [facility] emergency department for evaluation. Upon evaluation in the emergency department, patient was found to exhibit multiple SIRS criteria including fever of 100.4 F, severe tachycardia, tachypnea and leukopenia.  Patient was provided with 2 L of intravenous isotonic fluid in addition to initiation of intravenous meropenem.  Patient was found to have markedly elevated lactic acid level of 5.7.  Patient was also found to have recurrent acute kidney injury of 3.18.  The hospitalist group was then called to assess the patient for admission to the hospital.  Hospital Course / Discharge diagnoses: Principal Problem Sepsis due to Group A strep bacteremia Unlikely ongoing ESBL Escherichia coli (E. coli) (HCC) Oral thrush, POA -Recent history of recent ESBL E. coli pyelonephritis -Cultures remarkable for Group A Strep -ID consulted, appreciate insight and recommendations -He initially was on cefazolin/linezolid then de-escalated to cefadroxil 1g for 10 day course(total) - 8/15 - 03/14/21 with 4 additional days remaining on discharge -Oral thrush improved dilutional nystatin -Surgery signing off given no clear abdominal source and no need for procedure   Active Problems AKI, ongoing, stabilizing Likely multifactorial-prerenal in the setting of poor p.o. intake -Nephrology following, appreciate insight and recommendations, likely multifactorial in the setting of hypotension, uncontrolled HIV, multifocal infections -Good urine output, creatinine continues to improve, cleared by nephrology for discharge   Profound lactic acidosis -Resolved   AIDS/Human immunodeficiency virus (HIV) disease (HCC) Recurrent profound medication noncompliance -Continue home HAART  regimen, he is noncompliant at home   Hypoglycemia, profound, resolving -Hypoglycemia resolved  Essential hypertension -He  was on midodrine at home for chronic hypotension but now increasingly hypotensive.  Midodrine has been held and he was placed on nifedipine per nephrology  Hypovolemic hyponatremia -In the setting of above poor p.o. intake   Noncompliance  -Patient has known medication noncompliance, follow-up noncompliance and generally does not adhere to medical advice or recommendations   Substance abuse  - UDS previously positive for amphetamines and THC  - Counseled toward cessation      Discharge Instructions   Allergies as of 03/11/2021       Reactions   Vancomycin Anaphylaxis, Itching, Swelling, Other (See Comments)   Angioedema and "everything swells"   Amoxicillin Other (See Comments)   From childhood: "I had a reaction when i was little." (??) Has tolerated multiple cephalosporins in the past        Medication List     STOP taking these medications    midodrine 10 MG tablet Commonly known as: PROAMATINE       TAKE these medications    cefadroxil 500 MG capsule Commonly known as: DURICEF Take 2 capsules (1,000 mg total) by mouth daily for 4 days.   Creon 36000 UNITS Cpep capsule Generic drug: lipase/protease/amylase TAKE 2 CAPSULES (72,000 UNITS) BY MOUTH WITH EVERY MEAL, TAKE 1 CAPSULE (36,000 UNITS) BY MOUTH WITH EVERY SNACK.   FeroSul 325 (65 FE) MG tablet Generic drug: ferrous sulfate TAKE 1 TABLET (325 MG TOTAL) BY MOUTH DAILY AFTER BREAKFAST.   fluconazole 200 MG tablet Commonly known as: DIFLUCAN Take 1 tablet (200 mg total) by mouth daily.   loperamide 2 MG tablet Commonly known as: Imodium A-D Take 1 tablet (2 mg total) by mouth every 8 (eight) hours.   NIFEdipine 60 MG 24 hr tablet Commonly known as: ADALAT CC Take 1 tablet (60 mg total) by mouth daily.   pantoprazole 40 MG tablet Commonly known as: PROTONIX TAKE 1  TABLET (40 MG TOTAL) BY MOUTH 2 (TWO) TIMES DAILY.   potassium chloride 10 MEQ tablet Commonly known as: KLOR-CON TAKE 2 TABLETS BY MOUTH DAILY.   sulfamethoxazole-trimethoprim 800-160 MG tablet Commonly known as: BACTRIM DS Take 1 tablet by mouth 3 (three) times a week. Start taking on: March 12, 2021 What changed: when to take this   Symtuza 800-150-200-10 MG Tabs Generic drug: Darunavir-Cobicistat-Emtricitabine-Tenofovir Alafenamide Take 1 tablet by mouth daily with breakfast. What changed: Another medication with the same name was removed. Continue taking this medication, and follow the directions you see here.               Durable Medical Equipment  (From admission, onward)           Start     Ordered   03/11/21 0856  For home use only DME Walker  Once       Question:  Patient needs a walker to treat with the following condition  Answer:  Weakness   03/11/21 0855             Consultations: Surgery  ID Nephrology   Procedures/Studies:  CT ABDOMEN PELVIS WO CONTRAST  Result Date: 03/03/2021 CLINICAL DATA:  Acute abdominal pain. Possible sepsis. History of HIV. EXAM: CT ABDOMEN AND PELVIS WITHOUT CONTRAST TECHNIQUE: Multidetector CT imaging of the abdomen and pelvis was performed following the standard protocol without IV contrast. COMPARISON:  February 11, 2021 FINDINGS: Lower chest: Right lower lobe ground-glass nodules measuring up to 6 mm, likely infectious or inflammatory. Hepatobiliary: Unremarkable noncontrast appearance of the hepatic parenchyma. Mild periportal edema.  The gallbladder is dilated with an wall thickening pericholecystic fluid. No biliary ductal dilation. Pancreas: Peripancreatic fluid with unremarkable noncontrast appearance the pancreatic parenchyma. No pancreatic ductal dilation. Spleen: Within normal limits. Adrenals/Urinary Tract: Adrenal glands are unremarkable. Kidneys are normal, without renal calculi, contour deforming lesion, or  hydronephrosis. Bladder is unremarkable degree of distension. Stomach/Bowel: Stomach is within normal limits. Normal appendix. No evidence of small bowel bowel wall thickening, distention, or inflammatory changes. There is rectal wall thickening with adjacent inflammatory stranding. Vascular/Lymphatic: No abdominal aortic aneurysm. Evaluation of the abdomen pelvis for adenopathy is limited by an relative paucity of peritoneal fat and lack of IV contrast. However, there is interval increase in retroperitoneal adenopathy with the previously indexed left periaortic lymph node now measuring 17 mm on image 2/3 previously 12 mm. Reproductive: Prostate is unremarkable. Other: Walled off fluid collections. No pneumoperitoneum. No portal venous gas. Musculoskeletal: No acute or significant osseous findings. IMPRESSION: 1. Dilated gallbladder with pericholecystic fluid but without wall thickening. Findings which are equivocal for acute cholecystitis. Suggest correlation with tenderness to palpation and right upper quadrant ultrasound. 2. Peripancreatic fluid extending into the small bowel mesentery, possibly reflecting acute pancreatitis. Correlation with serum lipase suggested. 3. Rectal wall thickening with adjacent inflammatory stranding suggestive of proctitis. 4. Interval increase in retroperitoneal adenopathy, possibly reactive in this patient with reported history of HIV. 5. Right lower lobe ground-glass nodules measuring up to 6 mm, likely infectious or inflammatory. Electronically Signed   By: Maudry Mayhew MD   On: 03/03/2021 21:55   CT ABDOMEN PELVIS WO CONTRAST  Result Date: 02/11/2021 CLINICAL DATA:  Diffuse abdominal pain with vomiting. EXAM: CT ABDOMEN AND PELVIS WITHOUT CONTRAST TECHNIQUE: Multidetector CT imaging of the abdomen and pelvis was performed following the standard protocol without IV contrast. COMPARISON:  10/15/2020 FINDINGS: Lower chest: Interval development of ground-glass and  collapse/consolidative opacity in the dependent lung bases bilaterally, right greater than left. Findings probably reflect atelectasis with superimposed infection cannot be entirely excluded. Hepatobiliary: No focal abnormality in the liver on this study without intravenous contrast. There is no evidence for gallstones, gallbladder wall thickening, or pericholecystic fluid. No intrahepatic or extrahepatic biliary dilation. Pancreas: No focal mass lesion. No dilatation of the main duct. No intraparenchymal cyst. No peripancreatic edema. Spleen: No splenomegaly. No focal mass lesion. Adrenals/Urinary Tract: No adrenal nodule or mass. Right kidney appears edematous with fullness of the intrarenal collecting system and proximal right ureter. Subtle changes of proximal periureteric edema noted. Left kidney unremarkable on noncontrast imaging. Urine in the bladder has increased attenuation, likely reflecting excreted gadolinium contrast from brain MRI yesterday. Stomach/Bowel: Small hiatal hernia with mild circumferential wall thickening noted distal esophagus stomach otherwise unremarkable. No small bowel wall thickening. No small bowel dilatation. Neither the terminal ileum nor the appendix are discretely visible on this study without oral or intravenous contrast material. Colon is nondistended. Vascular/Lymphatic: No abdominal aortic aneurysm. There is no gastrohepatic or hepatoduodenal ligament lymphadenopathy. Para-aortic retroperitoneal lymphadenopathy again noted, similar to prior. 12 mm short axis left para-aortic node on 46/3 was 10 mm previously. Reproductive: The prostate gland and seminal vesicles are unremarkable. Other: No intraperitoneal free fluid. Musculoskeletal: No worrisome lytic or sclerotic osseous abnormality. IMPRESSION: 1. Interval development of ground-glass and collapse/consolidative opacity in the dependent lung bases bilaterally, right greater than left. Findings probably reflect atelectasis  with superimposed infection cannot be entirely excluded. 2. Right kidney appears enlarged and edematous with peripelvic and proximal periureteric edema. Right intrarenal collecting system and proximal right  ureter are distended. Assessment limited by the lack of intravenous contrast material, but given relatively normal appearance on CT from 4 months ago and lack of urinary stone disease, imaging features may be related to pyelonephritis. 3. Similar appearance of retroperitoneal lymphadenopathy in this patient with reported history of HIV. 4. Small hiatal hernia with mild circumferential wall thickening distal esophagus. Esophagitis could have this appearance. Electronically Signed   By: Kennith CenterEric  Mansell M.D.   On: 02/11/2021 10:57   DG Chest 2 View  Result Date: 02/09/2021 CLINICAL DATA:  Cough. EXAM: CHEST - 2 VIEW COMPARISON:  CT 10/15/2020.  Chest x-ray 08/07/2020, 09/19/2018. FINDINGS: Mediastinum hilar structures normal. Heart size normal. Stable mild chronic interstitial prominence. No acute alveolar infiltrate. No pleural effusion or pneumothorax. IMPRESSION: Stable chronic bilateral interstitial prominence. No acute cardiopulmonary disease identified. Electronically Signed   By: Maisie Fushomas  Register   On: 02/09/2021 13:22   CT Head Wo Contrast  Result Date: 02/09/2021 CLINICAL DATA:  Headache for 2 days, emesis EXAM: CT HEAD WITHOUT CONTRAST TECHNIQUE: Contiguous axial images were obtained from the base of the skull through the vertex without intravenous contrast. COMPARISON:  09/20/2018 FINDINGS: Brain: Chronic encephalomalacia within the left parietal region unchanged. No signs of acute infarct or hemorrhage. Lateral ventricles and midline structures are unremarkable. No acute extra-axial fluid collections. No mass effect. Vascular: No hyperdense vessel or unexpected calcification. Skull: Normal. Negative for fracture or focal lesion. Sinuses/Orbits: Mild mucosal thickening of the ethmoid and frontal  sinuses. No gas fluid levels. Other: None. IMPRESSION: 1. Mild ethmoid and frontal sinus disease. 2. Stable chronic encephalomalacia left parietal region. No acute process. Electronically Signed   By: Sharlet SalinaMichael  Brown M.D.   On: 02/09/2021 20:15   MR BRAIN W WO CONTRAST  Result Date: 02/10/2021 CLINICAL DATA:  Transient ischemic attack. History of HIV and treated syphilis fever, chills, headache and nausea. EXAM: MRI HEAD WITHOUT AND WITH CONTRAST TECHNIQUE: Multiplanar, multiecho pulse sequences of the brain and surrounding structures were obtained without and with intravenous contrast. CONTRAST:  7.325mL GADAVIST GADOBUTROL 1 MMOL/ML IV SOLN COMPARISON:  Head CT same day.  MRI 12/13/2017. FINDINGS: Brain: Diffusion imaging does not show any acute or subacute infarction. Brainstem and cerebellum are normal. Right cerebral hemispheres normal. There is old infarction in the medial left parietal lobe with atrophy, encephalomalacia and adjacent gliosis. No evidence of mass lesion, hemorrhage, hydrocephalus or extra-axial collection. After contrast administration, no abnormal brain or leptomeningeal enhancement occurs. Vascular: Major vessels at the base of the brain show flow. Skull and upper cervical spine: Negative Sinuses/Orbits: Mild mucosal thickening of the sinuses, most pronounced at the right maxillary sinus. No layering fluid. Orbits negative Other: None IMPRESSION: No acute intracranial finding. 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 No acute or subacute finding. No evidence of intracranial inflammatory disease. Mild sinus mucosal inflammatory changes, without advanced sinusitis. Electronically Signed   By: Paulina FusiMark  Shogry M.D.   On: 02/10/2021 13:22   NM Hepatobiliary Liver Func  Result Date: 03/04/2021 CLINICAL DATA:  Clinical concern for cholecystitis. EXAM: NUCLEAR MEDICINE HEPATOBILIARY IMAGING TECHNIQUE: Sequential images of the abdomen were obtained out to 60 minutes  following intravenous administration of radiopharmaceutical. RADIOPHARMACEUTICALS:  7.8 mCi Tc-10552m  Choletec IV COMPARISON:  None. FINDINGS: Prompt uptake by radiotracer evident with clearance of blood pool activity by 10-15 minutes. Patient was unable to continue beyond 20 minutes and stopped the exam. IMPRESSION: Nondiagnostic study. Patient requested to terminate the study at 20 minutes. Electronically Signed  By: Kennith Center M.D.   On: 03/04/2021 16:34   US RENAL  Result Date: 03/06/2021 CLINICAL DATA:  Acute renal injury. EXAM: RENAL / URINARY TRACT ULTRASOUND COMPLETE COMPARISON:  February 11, 2021 FINDINGS: Right Kidney: Renal measurements: 13.3 cm x 6.8 cm x 8.4 cm = volume: 396.2 mL. There is diffusely increased echogenicity of the renal parenchyma. No mass or hydronephrosis visualized. A mild amount of right-sided perinephric fluid is seen. Left Kidney: Renal measurements: 12.3 cm x 6.5 cm x 6.9 cm = volume: 266.0 mL. There is diffusely increased echogenicity of the renal parenchyma. No mass or hydronephrosis visualized. A mild amount of left-sided perinephric fluid is seen. Bladder: Partially decompressed and otherwise unremarkable. Other: A mild amount of ascites is noted. IMPRESSION: 1. Increased renal echogenicity which may represent sequelae associated with medical renal disease. 2. Ascites with bilateral perinephric fluid. Electronically Signed   By: Aram Candela M.D.   On: 03/06/2021 22:03   US RENAL  Result Date: 02/11/2021 CLINICAL DATA:  Acute renal insufficiency EXAM: RENAL / URINARY TRACT ULTRASOUND COMPLETE COMPARISON:  02/11/2021 FINDINGS: Right Kidney: Renal measurements: 12.9 x 6.5 x 6.3 cm = volume: 277.2 mL. Echogenicity within normal limits. No evidence of renal mass. Trace right hydronephrosis. Left Kidney: Renal measurements: 13.1 x 6.5 x 6.8 cm = volume: 304.0 mL. Echogenicity within normal limits. No mass or hydronephrosis visualized. Bladder: Bladder is decompressed with  a Foley catheter. Other: None. IMPRESSION: 1. Trace right hydronephrosis, with an improved appearance since prior study. 2. Choose otherwise unremarkable appearance of the bilateral kidneys. Electronically Signed   By: Sharlet Salina M.D.   On: 02/11/2021 22:38   DG Chest Portable 1 View  Result Date: 03/03/2021 CLINICAL DATA:  Shortness of breath, nausea, vomiting EXAM: PORTABLE CHEST 1 VIEW COMPARISON:  02/09/2021 FINDINGS: Heart and mediastinal contours are within normal limits. No focal opacities or effusions. No acute bony abnormality. IMPRESSION: No active disease. Electronically Signed   By: Charlett Nose M.D.   On: 03/03/2021 20:19   US Abdomen Limited RUQ (LIVER/GB)  Result Date: 03/03/2021 CLINICAL DATA:  Right upper quadrant pain EXAM: ULTRASOUND ABDOMEN LIMITED RIGHT UPPER QUADRANT COMPARISON:  CT 03/03/2021 FINDINGS: Gallbladder: Borderline gallbladder wall distension. No significant gallbladder wall thickening. No visible gallstones. Sonographic Eulah Pont sign is reportedly negative. Common bile duct: Diameter: 3.9 mm, nondilated Liver: Diffusely diminished hepatic echogenicity. No concerning focal lesion. Smooth surface contour. Portal vein is patent on color Doppler imaging with normal direction of blood flow towards the liver. Other: Unable to obtain decubitus imaging due to patient's status. IMPRESSION: Distended gallbladder without significant wall thickening or sonographic Murphy sign. Felt less likely to reflect an acute cholecystitis though if there is persisting concern, HIDA could be obtained. Diffusely decreased hepatic echogenicity. Nonspecific though can be seen with intrinsic liver disease. Recommend correlation with serology and patient history. Electronically Signed   By: Kreg Shropshire M.D.   On: 03/03/2021 23:54     Subjective: - no chest pain, shortness of breath, no abdominal pain, nausea or vomiting.   Discharge Exam: BP (!) 175/88 (BP Location: Right Leg)   Pulse 64    Temp 98.4 F (36.9 C) (Oral)   Resp 14   Wt 79.5 kg   SpO2 99%   BMI 23.12 kg/m   General: Pt is alert, awake, not in acute distress Cardiovascular: RRR, S1/S2 +, no rubs, no gallops Respiratory: CTA bilaterally, no wheezing, no rhonchi Abdominal: Soft, NT, ND, bowel sounds + Extremities: no edema,  no cyanosis    The results of significant diagnostics from this hospitalization (including imaging, microbiology, ancillary and laboratory) are listed below for reference.     Microbiology: Recent Results (from the past 240 hour(s))  Blood Culture (routine x 2)     Status: Abnormal   Collection Time: 03/03/21  8:36 PM   Specimen: Site Not Specified; Blood  Result Value Ref Range Status   Specimen Description SITE NOT SPECIFIED  Final   Special Requests   Final    BOTTLES DRAWN AEROBIC ONLY Blood Culture results may not be optimal due to an inadequate volume of blood received in culture bottles   Culture  Setup Time   Final    GRAM POSITIVE COCCI IN CHAINS AEROBIC BOTTLE ONLY CRITICAL RESULT CALLED TO, READ BACK BY AND VERIFIED WITH: PHARMD ALEX  L. [id number] FCP    Culture (A)  Final    STREPTOCOCCUS PYOGENES HEALTH DEPARTMENT NOTIFIED Performed at [facility], [address], [place], [place] [address]    Report Status 03/06/2021 FINAL  Final   Organism ID, Bacteria STREPTOCOCCUS PYOGENES  Final      Susceptibility   Streptococcus pyogenes - MIC*    PENICILLIN <=0.06 SENSITIVE Sensitive     CEFTRIAXONE <=0.12 SENSITIVE Sensitive     ERYTHROMYCIN >=8 RESISTANT Resistant     LEVOFLOXACIN 4 INTERMEDIATE Intermediate     VANCOMYCIN <=0.12 SENSITIVE Sensitive     * STREPTOCOCCUS PYOGENES  Blood Culture ID Panel (Reflexed)     Status: Abnormal   Collection Time: 03/03/21  8:36 PM  Result Value Ref Range Status   Enterococcus faecalis NOT DETECTED NOT DETECTED Final   Enterococcus Faecium NOT DETECTED NOT DETECTED Final   Listeria monocytogenes NOT DETECTED NOT  DETECTED Final   Staphylococcus species NOT DETECTED NOT DETECTED Final   Staphylococcus aureus (BCID) NOT DETECTED NOT DETECTED Final   Staphylococcus epidermidis NOT DETECTED NOT DETECTED Final   Staphylococcus lugdunensis NOT DETECTED NOT DETECTED Final   Streptococcus species DETECTED (A) NOT DETECTED Final    Comment: CRITICAL RESULT CALLED TO, READ BACK BY AND VERIFIED WITH: PHARMD ALEX  L. [phone] FCP    Streptococcus agalactiae NOT DETECTED NOT DETECTED Final   Streptococcus pneumoniae NOT DETECTED NOT DETECTED Final   Streptococcus pyogenes DETECTED (A) NOT DETECTED Final    Comment: CRITICAL RESULT CALLED TO, READ BACK BY AND VERIFIED WITH: PHARMD ALEX  L. [phone] FCP    A.calcoaceticus-baumannii NOT DETECTED NOT DETECTED Final   Bacteroides fragilis NOT DETECTED NOT DETECTED Final   Enterobacterales NOT DETECTED NOT DETECTED Final   Enterobacter cloacae complex NOT DETECTED NOT DETECTED Final   Escherichia coli NOT DETECTED NOT DETECTED Final   Klebsiella aerogenes NOT DETECTED NOT DETECTED Final   Klebsiella oxytoca NOT DETECTED NOT DETECTED Final   Klebsiella pneumoniae NOT DETECTED NOT DETECTED Final   Proteus species NOT DETECTED NOT DETECTED Final   Salmonella species NOT DETECTED NOT DETECTED Final   Serratia marcescens NOT DETECTED NOT DETECTED Final   Haemophilus influenzae NOT DETECTED NOT DETECTED Final   Neisseria meningitidis NOT DETECTED NOT DETECTED Final   Pseudomonas aeruginosa NOT DETECTED NOT DETECTED Final   Stenotrophomonas maltophilia NOT DETECTED NOT DETECTED Final   Candida albicans NOT DETECTED NOT DETECTED Final   Candida auris NOT DETECTED NOT DETECTED Final   Candida glabrata NOT DETECTED NOT DETECTED Final   Candida krusei NOT DETECTED NOT DETECTED Final   Candida parapsilosis NOT DETECTED NOT DETECTED  Final   Candida tropicalis NOT DETECTED NOT DETECTED Final   Cryptococcus neoformans/gattii NOT DETECTED NOT DETECTED Final     Comment: Performed at [facility], [address], [place], [place] [address]  Blood Culture (routine x 2)     Status: Abnormal   Collection Time: 03/03/21  8:41 PM   Specimen: BLOOD  Result Value Ref Range Status   Specimen Description BLOOD RIGHT UPPER  Final   Special Requests   Final    BOTTLES DRAWN AEROBIC AND ANAEROBIC Blood Culture results may not be optimal due to an inadequate volume of blood received in culture bottles   Culture  Setup Time   Final    GRAM POSITIVE COCCI IN BOTH AEROBIC AND ANAEROBIC BOTTLES CRITICAL VALUE NOTED.  VALUE IS CONSISTENT WITH PREVIOUSLY REPORTED AND CALLED VALUE.    Culture (A)  Final    STREPTOCOCCUS [facility]OGENES HEALTH DEPARTMENT NOTIFIED SUSCEPTIBILITIES PERFORMED ON PREVIOUS CULTURE WITHIN THE LAST 5 DAYS. Performed at [facility], [address] [place], [place] [address]    Report Status 03/09/2021 FINAL  Final  Resp Panel by RT-PCR (Flu A&B, Covid) Nasopharyngeal Swab     Status: None   Collection Time: 03/03/21 10:49 PM   Specimen: Nasopharyngeal Swab; Nasopharyngeal(NP) swabs in vial transport medium  Result Value Ref Range Status   SARS Coronavirus 2 by RT PCR NEGATIVE NEGATIVE Final    Comment: (NOTE) SARS-CoV-2 target nucleic acids are NOT DETECTED.  The SARS-CoV-2 RNA is generally detectable in upper respiratory specimens during the acute phase of infection. The lowest concentration of SARS-CoV-2 viral copies this assay can detect is 138 copies/mL. A negative result does not preclude SARS-Cov-2 infection and should not be used as the sole basis for treatment or other patient management decisions. A negative result may occur with  improper specimen collection/handling, submission of specimen other than nasopharyngeal swab, presence of viral mutation(s) within the areas targeted by this assay, and inadequate number of viral copies(<138 copies/mL). A negative result must be combined with clinical observations, patient  history, and epidemiological information. The expected result is Negative.  Fact Sheet for Patients:  BloggerCourse.com  Fact Sheet for Healthcare Providers:  SeriousBroker.it  This test is no t yet approved or cleared by the [place] FDA and  has been authorized for detection and/or diagnosis of SARS-CoV-2 by FDA under an Emergency Use Authorization (EUA). This EUA will remain  in effect (meaning this test can be used) for the duration of the COVID-19 declaration under Section 564(b)(1) of the Act, 21 U.S.C.section 360bbb-3(b)(1), unless the authorization is terminated  or revoked sooner.       Influenza A by PCR NEGATIVE NEGATIVE Final   Influenza B by PCR NEGATIVE NEGATIVE Final    Comment: (NOTE) The Xpert Xpress SARS-CoV-2/FLU/RSV plus assay is intended as an aid in the diagnosis of influenza from Nasopharyngeal swab specimens and should not be used as a sole basis for treatment. Nasal washings and aspirates are unacceptable for Xpert Xpress SARS-CoV-2/FLU/RSV testing.  Fact Sheet for Patients: BloggerCourse.com  Fact Sheet for Healthcare Providers: SeriousBroker.it  This test is not yet approved or cleared by the [place] FDA and has been authorized for detection and/or diagnosis of SARS-CoV-2 by FDA under an Emergency Use Authorization (EUA). This EUA will remain in effect (meaning this test can be used) for the duration of the COVID-19 declaration under Section 564(b)(1) of the Act, 21 U.S.C. section 360bbb-3(b)(1), unless the authorization is terminated or revoked.  Performed at [facility], [address], [place], [place] [address]   Urine Culture     Status: None   Collection Time: 03/04/21 10:19 PM   Specimen: Urine, Clean Catch  Result Value Ref Range Status   Specimen Description URINE, CLEAN CATCH  Final   Special Requests NONE  Final    Culture   Final    NO GROWTH Performed at [facility], [address] [place], [place] [address]    Report Status 03/08/2021 FINAL  Final  MRSA Next Gen by PCR, Nasal     Status: None   Collection Time: 03/05/21  1:26 AM   Specimen: Nasal Mucosa; Nasal Swab  Result Value Ref Range Status   MRSA by PCR Next Gen NOT DETECTED NOT DETECTED Final    Comment: (NOTE) The GeneXpert MRSA Assay (FDA approved for NASAL specimens only), is one component of a comprehensive MRSA colonization surveillance program. It is not intended to diagnose MRSA infection nor to guide or monitor treatment for MRSA infections. Test performance is not FDA approved in patients less than 65 years old. Performed at [facility], [address], [place], [place] [address]   Culture, blood (routine x 2)     Status: None (Preliminary result)   Collection Time: 03/08/21 11:16 AM   Specimen: BLOOD RIGHT HAND  Result Value Ref Range Status   Specimen Description BLOOD RIGHT HAND  Final   Special Requests   Final    BOTTLES DRAWN AEROBIC ONLY Blood Culture results may not be optimal due to an inadequate volume of blood received in culture bottles   Culture   Final    NO GROWTH 2 DAYS Performed at [facility], [address], [place], [place] [address]    Report Status PENDING  Incomplete  Culture, blood (routine x 2)     Status: None (Preliminary result)   Collection Time: 03/08/21 11:32 AM   Specimen: BLOOD RIGHT HAND  Result Value Ref Range Status   Specimen Description BLOOD RIGHT HAND  Final   Special Requests   Final    BOTTLES DRAWN AEROBIC ONLY Blood Culture results may not be optimal due to an inadequate volume of blood received in culture bottles   Culture   Final    NO GROWTH 2 DAYS Performed at [facility], [address] [place], [place] [address]    Report Status PENDING  Incomplete     Labs: Basic Metabolic Panel: Recent Labs  Lab 03/07/21 0229 03/08/21 1116  03/09/21 0304 03/10/21 0952 03/11/21 0130  NA 124* 129* 131* 132* 131*  K 3.6 3.3* 3.4* 3.4* 3.8  CL 93* 93* 92* 98 98  CO2 16* 23 24 24 22   GLUCOSE 91 75 79 98 76  BUN 82* 86* 84* 74* 64*  CREATININE 6.85* 7.77* 7.76* 6.65* 5.79*  CALCIUM 6.9* 6.9* 7.3* 7.6* 7.9*   Liver Function Tests: Recent Labs  Lab 03/07/21 0229 03/08/21 1116 03/09/21 0304 03/10/21 0952 03/11/21 0130  AST 34 24 22 18 21   ALT 14 5 <5 <5 <5  ALKPHOS 90 77 78 76 90  BILITOT 0.9 0.9 0.7 1.1 0.9  PROT 5.8* 6.3* 6.6 7.2 8.1  ALBUMIN 1.6* 1.8* 1.8* 1.9* 2.2*   CBC: Recent Labs  Lab 03/07/21 0229 03/08/21 1116 03/09/21 0304 03/10/21 0952 03/11/21 0130  WBC 4.8 3.9* 3.5* 3.7* 4.4  NEUTROABS 2.7 1.7 1.5* 2.0 2.4  HGB 8.8* 9.7*  9.6*  10.0* 9.9* 10.8*  HCT 25.9* 27.7* 29.6* 29.5* 32.6*  MCV 87.8 87.1 87.6 88.9 90.3  PLT 47* 49* 50* 56* 61*   CBG: Recent Labs  Lab 03/09/21 1243 03/09/21 2006 03/10/21 0000 03/10/21 0412 03/10/21 2321  GLUCAP 81 71 69* 70 90   Hgb A1c No results for input(s): HGBA1C in the last 72 hours. Lipid Profile No results for input(s): CHOL, HDL, LDLCALC, TRIG, CHOLHDL, LDLDIRECT in the last 72 hours. Thyroid function studies No results for input(s): TSH, T4TOTAL, T3FREE, THYROIDAB in the last 72 hours.  Invalid input(s): FREET3 Urinalysis    Component Value Date/Time   COLORURINE AMBER (A) 03/05/2021 0501   APPEARANCEUR CLOUDY (A) 03/05/2021 0501   APPEARANCEUR Hazy 07/27/2013 1742   LABSPEC 1.016 03/05/2021 0501   LABSPEC 1.026 07/27/2013 1742   PHURINE 5.0 03/05/2021 0501   GLUCOSEU NEGATIVE 03/05/2021 0501   GLUCOSEU Negative 07/27/2013 1742   HGBUR SMALL (A) 03/05/2021 0501   BILIRUBINUR NEGATIVE 03/05/2021 0501   BILIRUBINUR Negative 07/27/2013 1742   KETONESUR NEGATIVE 03/05/2021 0501   PROTEINUR >=300 (A) 03/05/2021 0501   UROBILINOGEN 1 12/17/2013 1118   NITRITE NEGATIVE 03/05/2021 0501   LEUKOCYTESUR TRACE (A) 03/05/2021 0501   LEUKOCYTESUR  Negative 07/27/2013 1742    FURTHER DISCHARGE INSTRUCTIONS:   Get Medicines reviewed and adjusted: Please take all your medications with you for your next visit with your Primary MD   Laboratory/radiological data: Please request your Primary MD to go over all hospital tests and procedure/radiological results at the follow up, please ask your Primary MD to get all Hospital records sent to his/her office.   In some cases, they will be blood work, cultures and biopsy results pending at the time of your discharge. Please request that your primary care M.D. goes through all the records of your hospital data and follows up on these results.   Also Note the following: If you experience worsening of your admission symptoms, develop shortness of breath, life threatening emergency, suicidal or homicidal thoughts you must seek medical attention immediately by calling 911 or calling your MD immediately  if symptoms less severe.   You must read complete instructions/literature along with all the possible adverse reactions/side effects for all the Medicines you take and that have been prescribed to you. Take any new Medicines after you have completely understood and accpet all the possible adverse reactions/side effects.    Do not drive when taking Pain medications or sleeping medications (Benzodaizepines)   Do not take more than prescribed Pain, Sleep and Anxiety Medications. It is not advisable to combine anxiety,sleep and pain medications without talking with your primary care practitioner   Special Instructions: If you have smoked or chewed Tobacco  in the last 2 yrs please stop smoking, stop any regular Alcohol  and or any Recreational drug use.   Wear Seat belts while driving.   Please note: You were cared for by a hospitalist during your hospital stay. Once you are discharged, your primary care physician will handle any further medical issues. Please note that NO REFILLS for any discharge  medications will be authorized once you are discharged, as it is imperative that you return to your primary care physician (or establish a relationship with a primary care physician if you do not have one) for your post hospital discharge needs so that they can reassess your need for medications and monitor your lab values.  Time coordinating discharge: 40 minutes  SIGNED:  Pamella Pert, MD, PhD 03/11/2021, 9:00  AM

## 2021-03-11 NOTE — Progress Notes (Signed)
This chaplain is present for F/U spiritual care as the Pt. is anticipating discharge. The Pt. is awake and communicating with family on the phone.  The chaplain understands the Pt. remains motivated to make changes necessary for a healthier lifestyle. The Pt. states "he is looking forward to the TV watching him" as he sleeps and eats on his own schedule.  The Pt. accepted the chaplain's invitation for prayer at the bedside.

## 2021-03-11 NOTE — Progress Notes (Signed)
Patient ID: Jacob Gutierrez, male   DOB: 1985-08-08, 35 y.o.   MRN: [id number]  [facility] Progress Note   Assessment/ Plan:   1. Acute kidney Injury: Normal baseline renal function (creatinine 0.8-0.9).  AKI appears to have been from ATN associated with sepsis/relative hypotension and renal recovery is now underway with good urine output and serial improvement of BUN and creatinine.  From a renal standpoint given 48 hours of consistently improving renal function, he is stable enough to discharge home today and follow-up with his primary care provider/ID clinic in 1 week for labs to assess for continued renal recovery.. 2.  Hypertension: Blood pressures remain elevated, increase nifedipine XL dose to 60 mg daily 3.  Group A streptococcus bacteremia/sepsis: On antibiotic therapy with cefadroxil after transition from Ancef/linezolid. 4.  HIV infection/AIDS: Poor adherence to antiretroviral therapy as an outpatient but based on palliative care discussion earlier this admission, apparently motivated to do better. 5.  Hyponatremia: Secondary to free water excretion defect in the setting of acute kidney injury and appears t stable overnight-anticipate to improve further as renal function improves.  Subjective:   Denies any complaints except for some discomfort from laying in hospital bed.   Objective:   BP (!) 175/88 (BP Location: Right Leg)   Pulse 64   Temp 98.4 F (36.9 C) (Oral)   Resp 14   Wt 79.5 kg   SpO2 99%   BMI 23.12 kg/m   Intake/Output Summary (Last 24 hours) at 03/11/2021 0804 Last data filed at 03/10/2021 1800 Gross per 24 hour  Intake 360 ml  Output 1525 ml  Net -1165 ml   Weight change:   Physical Exam: Gen: Appears chronically ill, resting comfortably in bed CVS: Pulse regular rhythm, normal rate, S1 and S2 normal Resp: Anteriorly clear to auscultation bilaterally, no rales/rhonchi Abd: Soft, flat, nontender, bowel sounds normal Ext: Trace ankle edema,  none over pretibial area.  Imaging: No results found.  Labs: BMET Recent Labs  Lab 03/05/21 0255 03/05/21 0526 03/05/21 1824 03/06/21 0020 03/07/21 0229 03/08/21 1116 03/09/21 0304 03/10/21 0952 03/11/21 0130  NA 125*   < > 124* 124* 124* 129* 131* 132* 131*  K 4.5  --   --  4.2 3.6 3.3* 3.4* 3.4* 3.8  CL 94*  --   --  93* 93* 93* 92* 98 98  CO2 18*  --   --  18* 16* 23 24 24 22   GLUCOSE 92  --   --  119* 91 75 79 98 76  BUN 56*  --   --  70* 82* 86* 84* 74* 64*  CREATININE 5.17*  --   --  6.24* 6.85* 7.77* 7.76* 6.65* 5.79*  CALCIUM 6.8*  --   --  6.8* 6.9* 6.9* 7.3* 7.6* 7.9*   < > = values in this interval not displayed.   CBC Recent Labs  Lab 03/08/21 1116 03/09/21 0304 03/10/21 0952 03/11/21 0130  WBC 3.9* 3.5* 3.7* 4.4  NEUTROABS 1.7 1.5* 2.0 2.4  HGB 9.7*  9.6* 10.0* 9.9* 10.8*  HCT 27.7* 29.6* 29.5* 32.6*  MCV 87.1 87.6 88.9 90.3  PLT 49* 50* 56* 61*    Medications:     cefadroxil  1 g Oral Q24H   Darunavir-Cobicistat-Emtricitabine-Tenofovir Alafenamide  1 tablet Oral Q breakfast   NIFEdipine  30 mg Oral Daily   nystatin  5 mL Mouth/Throat TID AC & HS   sodium chloride flush  10-40 mL Intracatheter Q12H   sulfamethoxazole-trimethoprim  1 tablet Oral Once per day on Mon Wed Fri   Zetta Bills, MD 03/11/2021, 8:04 AM

## 2021-03-11 NOTE — TOC Progression Note (Signed)
Transition of Care [facility]) - Progression Note    Patient Details  Name: Jacob Gutierrez MRN: [id number] Date of Birth: [birth year]  [facility]) CM/SW Contact  Beckie Busing, RN Phone Number:[phone]  03/11/2021, 9:55 AM  Clinical Narrative:    Patient is discharging home. DME rolling walker has been set up through [employer]. Walker to be delivered to the room. There are no other needs noted at this time. TOC will sign off.   Expected Discharge Plan: Home/Self Care Barriers to Discharge: No Barriers Identified  Expected Discharge Plan and Services Expected Discharge Plan: Home/Self Care In-house Referral: Hospice / Palliative Care Discharge Planning Services: CM Consult   Living arrangements for the past 2 months: Single Family Home Expected Discharge Date: 03/11/21               DME Arranged: Dan Humphreys rolling DME Agency: [employer] Date DME Agency Contacted: 03/11/21 Time DME Agency Contacted: [phone] Representative spoke with at DME Agency: [facility] Agency: NA         Social Determinants of Health (SDOH) Interventions    Readmission Risk Interventions Readmission Risk Prevention Plan 03/10/2021  Transportation Screening Complete  PCP or Specialist Appt within 3-5 Days Complete  HRI or Home Care Consult Complete  Social Work Consult for Recovery Care Planning/Counseling Complete  Palliative Care Screening Complete  Medication Review [employer]) Complete  Some recent data might be hidden

## 2021-03-13 LAB — CULTURE, BLOOD (ROUTINE X 2)
Culture: NO GROWTH
Culture: NO GROWTH

## 2021-03-15 ENCOUNTER — Telehealth: Payer: Self-pay | Admitting: Infectious Diseases

## 2021-03-15 DIAGNOSIS — B20 Human immunodeficiency virus [HIV] disease: Secondary | ICD-10-CM

## 2021-03-15 NOTE — Telephone Encounter (Signed)
Cece can you please help try to connect Jacob Gutierrez / Jacob Gutierrez with Internal Medicine to help with care needs? I think the would be most helpful to get him the care he needs in the home.   We need to also connect him with [facility] - hopefully Colin Mulders will be here Tuesday but I will go ahead and put in a referral for them to help.  Is he staying with mom or outside the house?

## 2021-03-15 NOTE — Telephone Encounter (Signed)
-----   Message from Gerome Apley Readling sent at 03/15/2021  3:02 PM EDT ----- Regarding: Patient care needs Contact: [phone] Hi, this patient was seen for a palliative care consult last week while hospitalized.  Our NP spoke with him and his mother.  His mother, Tomasita Morrow, called today asking for help with resources for the patient.  He was discharged with a walker, but is requiring more assistance than that.  He is unable to care for himself.  He is scheduled with Dr. Carver Fila on 9/6, but she is concerned about his needs between now and then.  Is it possible for him to be seen prior to 9/6 or connected with the [facility] SW in the meantime?  The mother's phone number is [phone].

## 2021-03-16 NOTE — Telephone Encounter (Signed)
Attempted to call patient to provide information for internal medicine to assist with in home needs.Need to confirm where patient is currently staying. Left voicemail requesting call back. Will forward message to triage in case patient calls back. Juanita Laster, RMA

## 2021-03-16 NOTE — Telephone Encounter (Signed)
FYI:  Spoke with patient's mother Jacob Gutierrez to provide information for Internal Medicine. Jacob Gutierrez states that patient is currently living with friends in [place] area. Does not have stable housing and would like to know if Jacob Gutierrez could be placed somewhere to help him. Provided Jacob Gutierrez with IM phone number to call and schedule appointment for assistance and resources.

## 2021-03-26 LAB — ACID FAST CULTURE WITH REFLEXED SENSITIVITIES (MYCOBACTERIA): Acid Fast Culture: NEGATIVE

## 2021-03-26 IMAGING — CT CT ABD-PELV W/ CM
2 of 4 series · 15 of 46 positions shown, 17 images · IV contrast (omnipaque)
Comparison: April 13, 2019

CLINICAL DATA: Pain and constipation

EXAM:
CT ABDOMEN AND PELVIS WITH CONTRAST
TECHNIQUE: Multidetector CT imaging of the abdomen and pelvis was performed
using the standard protocol following bolus administration of
intravenous contrast.
CONTRAST:  100mL OMNIPAQUE IOHEXOL 300 MG/ML  SOLN

[Series 3: abdomen 5.0 · axial · 0.68mm/px · z∈[+729,+1174]mm · 12 of 99 slices shown, 14 images]
[im 5/99  soft-tissue]
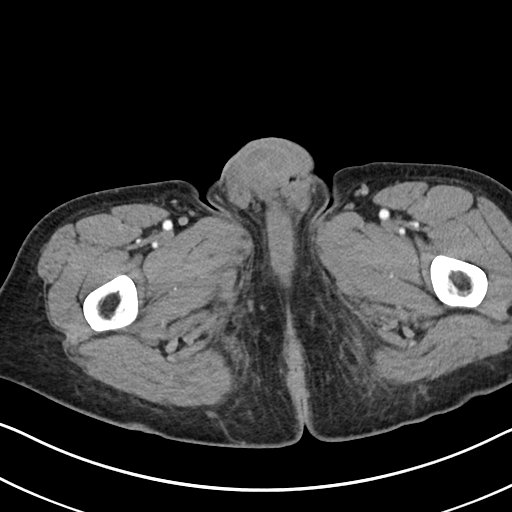
[im 5/99  bone]
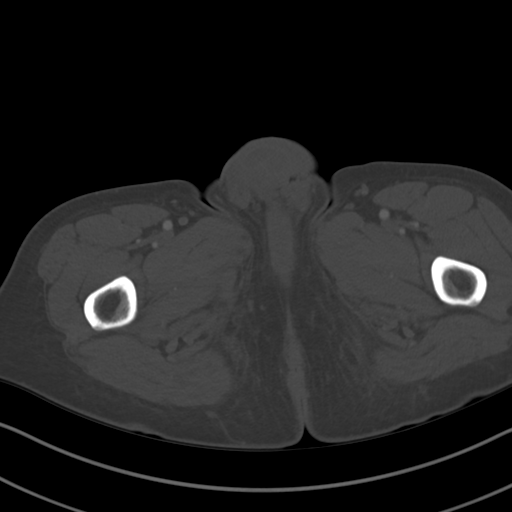
[im 15/99  soft-tissue]
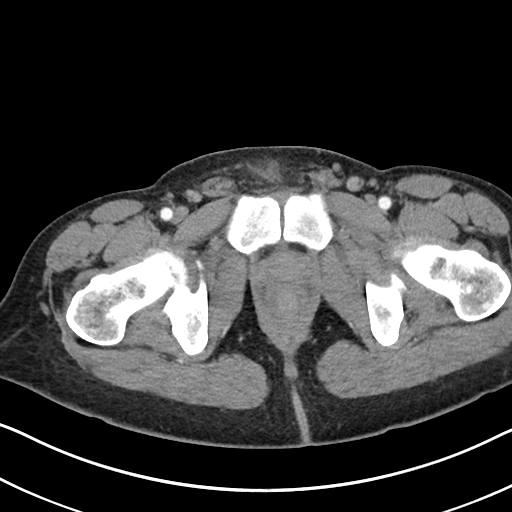
[im 24/99  soft-tissue]
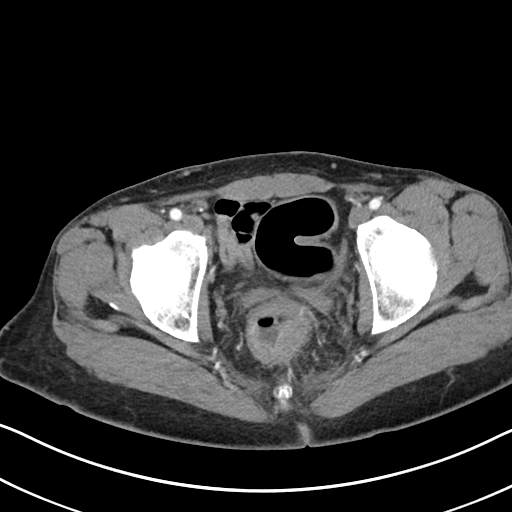
[im 29/99  soft-tissue]
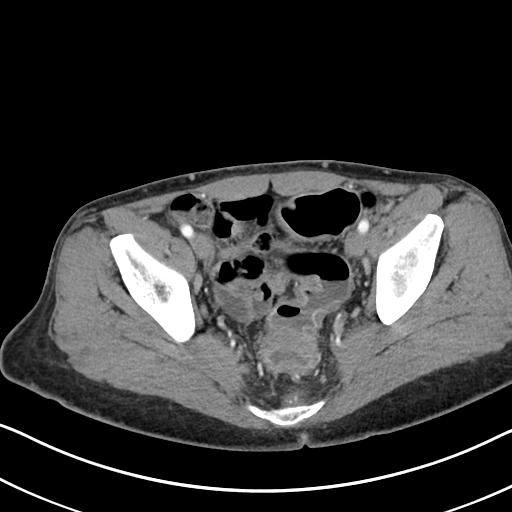
[im 38/99  soft-tissue]
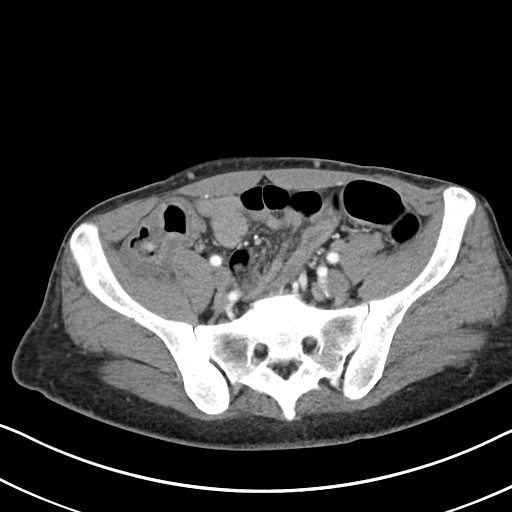
[im 47/99  soft-tissue]
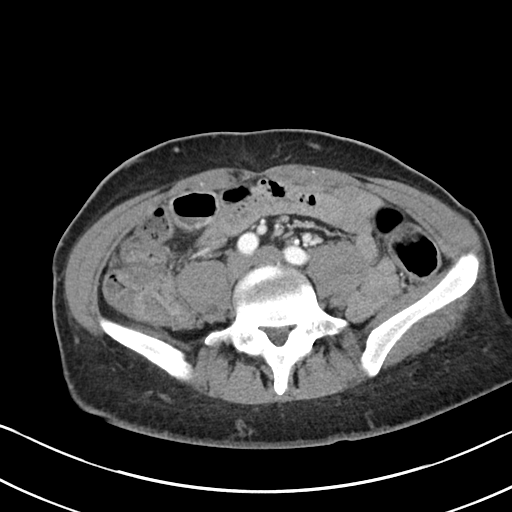
[im 52/99  soft-tissue]
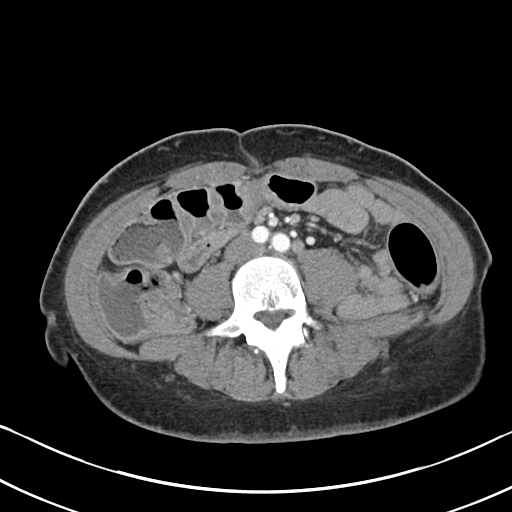
[im 61/99  soft-tissue]
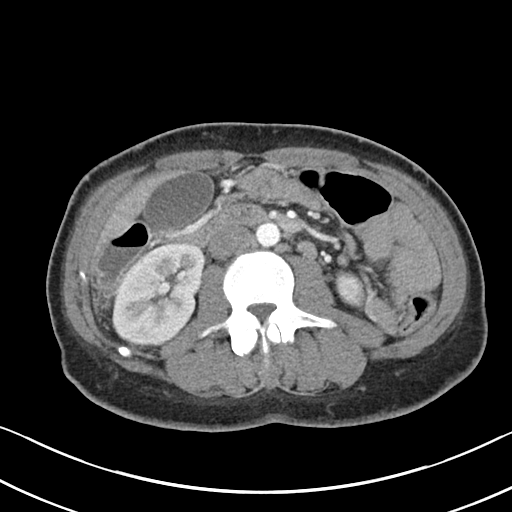
[im 71/99  soft-tissue]
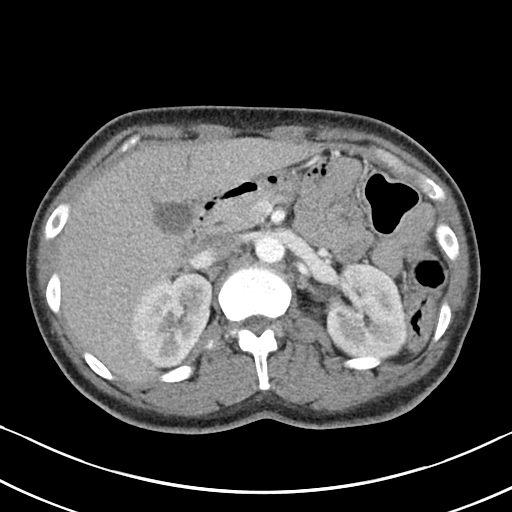
[im 71/99  bone]
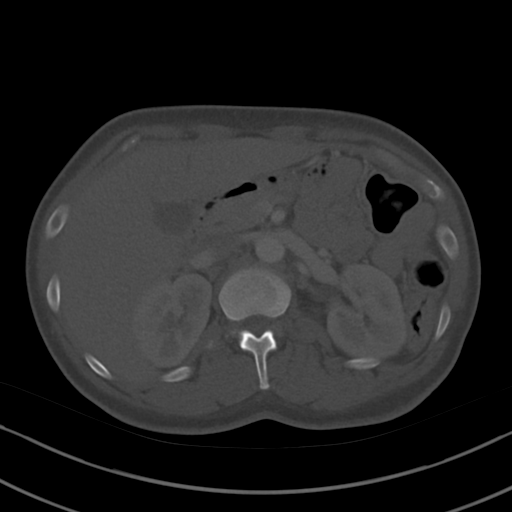
[im 75/99  soft-tissue]
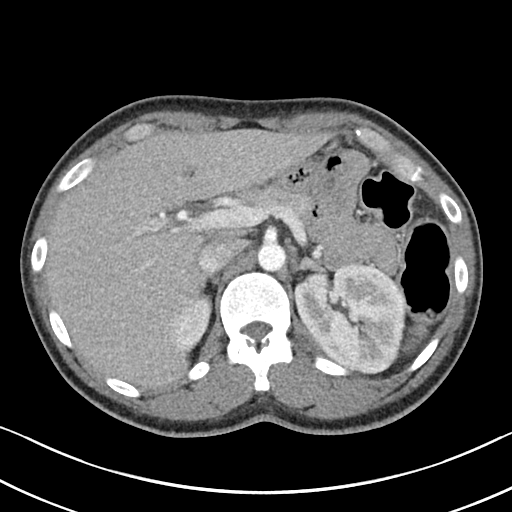
[im 85/99  soft-tissue]
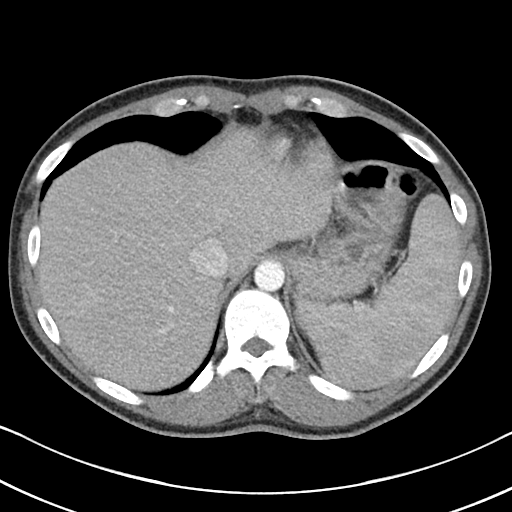
[im 94/99  soft-tissue]
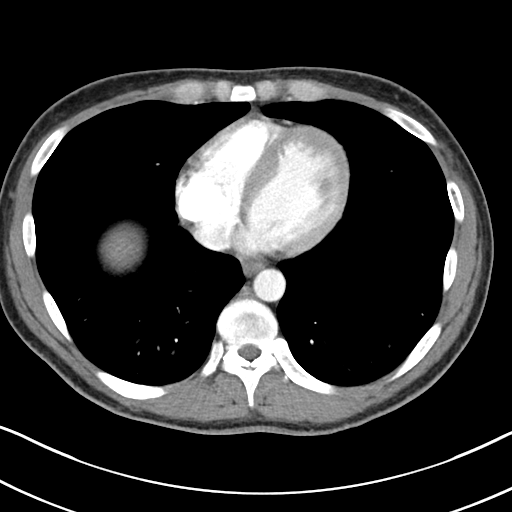

[Series 6: abdomen 3.0 mpr cor · coronal · 0.74mm/px · 3 of 90 slices shown]
[im 30/90  soft-tissue]
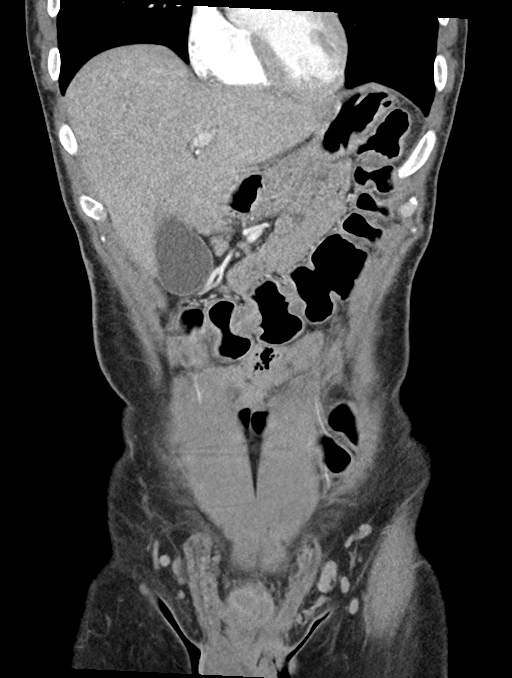
[im 40/90  soft-tissue]
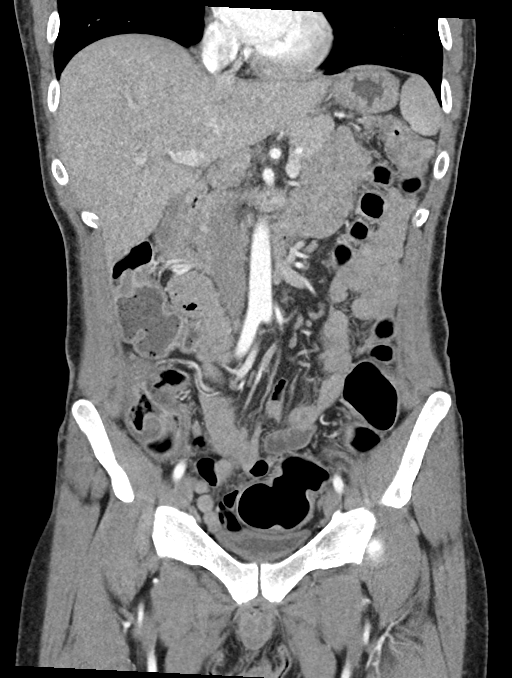
[im 50/90  soft-tissue]
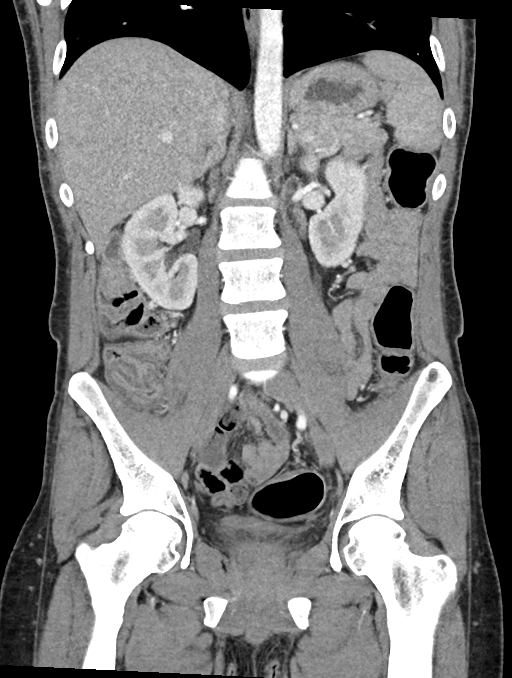

[15 of 46 positions shown; findings below may reference images not displayed]

FINDINGS: Lower chest: Lung bases are clear.

Hepatobiliary: No focal liver lesions are evident. The gallbladder
wall is not appreciably thickened. There is no biliary duct
dilatation.

Pancreas: There is no pancreatic mass or inflammatory focus.

Spleen: No splenic lesions are appreciable.

Adrenals/Urinary Tract: Adrenals bilaterally appear normal. Kidneys
bilaterally show no evident mass or hydronephrosis on either side.
There is no evident renal or ureteral calculus on either side.
Urinary bladder is midline with wall thickness within normal limits.

Stomach/Bowel: There is mild rectal wall thickening without soft
tissue stranding or fluid in this area. No perirectal abscess or
fistula. No bowel wall thickening is seen elsewhere. There is no
evident bowel obstruction. The terminal ileum appears normal. There
is no appreciable free air or portal venous air.

Vascular/Lymphatic: There is no abdominal aortic aneurysm. No
arterial vascular lesions are evident. Major venous structures
appear patent. There is no evident adenopathy in the abdomen or
pelvis. Subcentimeter retroperitoneal lymph nodes do not meet size
criteria for pathologic significance.

Reproductive: Prostate and seminal vesicles appear normal in size
and contour.

Other: The appendix appears normal. No evident abscess or ascites in
the abdomen or pelvis.

Musculoskeletal: Probable small bone islands noted in the left iliac
bone, stable. No lytic or destructive bone lesions are evident. No
intramuscular or abdominal wall lesions are evident.
IMPRESSION: 1. Slight thickening of the rectal wall noted. There may be a degree
of proctitis. Note that there is no associated fluid or soft tissue
stranding. No abscess or fistula in this area. No bowel wall
thickening elsewhere noted.

2. No evident bowel obstruction. No abscess in the abdomen pelvis.
Appendix appears normal.

3. No renal or ureteral calculus. No hydronephrosis. Urinary bladder
wall thickness normal.

## 2021-03-30 ENCOUNTER — Other Ambulatory Visit: Payer: Self-pay

## 2021-03-30 ENCOUNTER — Ambulatory Visit (INDEPENDENT_AMBULATORY_CARE_PROVIDER_SITE_OTHER): Payer: Medicaid Other | Admitting: Family

## 2021-03-30 ENCOUNTER — Encounter: Payer: Self-pay | Admitting: Family

## 2021-03-30 ENCOUNTER — Other Ambulatory Visit (HOSPITAL_COMMUNITY): Payer: Self-pay

## 2021-03-30 VITALS — BP 105/70 | HR 105 | Temp 98.5°F | Wt 170.0 lb

## 2021-03-30 DIAGNOSIS — Z0000 Encounter for general adult medical examination without abnormal findings: Secondary | ICD-10-CM | POA: Diagnosis present

## 2021-03-30 DIAGNOSIS — B20 Human immunodeficiency virus [HIV] disease: Secondary | ICD-10-CM

## 2021-03-30 DIAGNOSIS — Z5900 Homelessness unspecified: Secondary | ICD-10-CM | POA: Diagnosis not present

## 2021-03-30 DIAGNOSIS — B950 Streptococcus, group A, as the cause of diseases classified elsewhere: Secondary | ICD-10-CM

## 2021-03-30 MED ORDER — SULFAMETHOXAZOLE-TRIMETHOPRIM 800-160 MG PO TABS
1.0000 | ORAL_TABLET | ORAL | 2 refills | Status: DC
  Filled 2021-03-30: qty 30, 70d supply, fill #0
  Filled 2021-05-03: qty 12, 28d supply, fill #0
  Filled 2021-07-22: qty 12, 28d supply, fill #1

## 2021-03-30 MED ORDER — SYMTUZA 800-150-200-10 MG PO TABS
1.0000 | ORAL_TABLET | Freq: Every day | ORAL | 2 refills | Status: DC
  Filled 2021-03-30 – 2021-05-03 (×2): qty 30, 30d supply, fill #0
  Filled 2021-07-22: qty 30, 30d supply, fill #1

## 2021-03-30 NOTE — Patient Instructions (Addendum)
Nice to see you.   We will check your lab work today.  Continue to take your medication as prescribed.   Refills have been sent to the pharmacy and will be available to pick up at [facility].   We will get you referred to the dentist and [facility].   Plan for follow up in 1 month or sooner if needed with lab work on the same day.  Glad you are doing better!

## 2021-03-30 NOTE — Progress Notes (Signed)
Brief Narrative   Patient ID: Jacob Gutierrez, male    DOB: 1985/11/06, 35 y.o.   MRN: [id number]  Jacob Gutierrez is a 35 year old African-American male diagnosed with HIV-1 disease in 2013 with risk factor of MSM.  Initial CD4 nadir of 60.  Previous history of gonorrhea, pulmonary cocciodomycosis.  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  Previous ART regimen include Stribild with Prezista and now Symtuza.  Subjective:    Chief Complaint  Patient presents with   Follow-up    B20    HPI:  Jacob Gutierrez is a 35 y.o. male with poorly controlled AIDS last seen on 10/15/20 with less than optimal adherence to his ART regimen with viral load of 5,990 and CD4 count of <35. He has subsequently been hospitalized twice over the course of the last 2 months most recently being discharged on 03/11/21 having been seen by Dr. Gale Journey with Group A Strep bacteremia and recent treatment for oropharyngeal candidiasis and ESBL E. Coli pyelonephritis. No clear source of infection. Treated with Cefazolin and initially linezolid for antitoxin effects and transitioned to Cefadroxil with plan to complete treatment on 03/14/21.He was restarted on his Symtuza and Bactrim. Viral load was 454,000 with CD4 count of <35 on 03/04/21. Here today for hospitalization follow up.  Jacob Gutierrez completed his cefadroxil as prescribed on 03/14/2021 with no adverse side effects.  Has been taking his Symtuza daily as prescribed with Bactrim every other day for OI prophylaxis.  Feeling better since leaving the hospital.  Has some concern about peeling skin on his bilateral feet.  He is eating adequately with frequent small meals as he gets full quickly.  His parents are present for today's visit to discuss any assistance.  Jacob Gutierrez is receiving disability payments.  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  Housing is currently stable and is staying with friend.  Jacob Gutierrez is picking up  medication from the [place] [facility]. Denies feelings of being down, depressed or hopeless recently. No curent recreational or illicit drug use or alcohol consumption. Does continue to smoke tobacco. Condoms offered and declined.  Allergies  Allergen Reactions   Vancomycin Anaphylaxis, Itching, Swelling and Other (See Comments)    Angioedema and "everything swells"   Amoxicillin Other (See Comments)    From childhood: "I had a reaction when i was little." (??) Has tolerated multiple cephalosporins in the past      Outpatient Medications Prior to Visit  Medication Sig Dispense Refill   NIFEdipine (ADALAT CC) 60 MG 24 hr tablet Take 1 tablet (60 mg total) by mouth daily. 30 tablet 0   Darunavir-Cobicistat-Emtricitabine-Tenofovir Alafenamide (SYMTUZA) 800-150-200-10 MG TABS Take 1 tablet by mouth daily with breakfast. 30 tablet 0   sulfamethoxazole-trimethoprim (BACTRIM DS) 800-160 MG tablet Take 1 tablet by mouth 3 (three) times a week. 30 tablet 0   ferrous sulfate 325 (65 FE) MG tablet TAKE 1 TABLET (325 MG TOTAL) BY MOUTH DAILY AFTER BREAKFAST. (Patient not taking: Reported on 03/30/2021) 30 tablet 0   fluconazole (DIFLUCAN) 200 MG tablet Take 1 tablet (200 mg total) by mouth daily. (Patient not taking: Reported on 03/30/2021) 7 tablet 0   lipase/protease/amylase (CREON) 36000 UNITS CPEP capsule TAKE 2 CAPSULES (72,000 UNITS) BY MOUTH WITH EVERY MEAL, TAKE 1 CAPSULE (36,000 UNITS) BY MOUTH WITH EVERY SNACK. (Patient not taking: Reported on 03/30/2021) 240 capsule 11   loperamide (IMODIUM A-D) 2 MG tablet Take 1 tablet (2 mg total) by  mouth every 8 (eight) hours. (Patient not taking: Reported on 03/30/2021) 30 tablet 5   pantoprazole (PROTONIX) 40 MG tablet TAKE 1 TABLET (40 MG TOTAL) BY MOUTH 2 (TWO) TIMES DAILY. (Patient not taking: Reported on 03/30/2021) 30 tablet 0   potassium chloride (KLOR-CON) 10 MEQ tablet TAKE 2 TABLETS BY MOUTH DAILY. (Patient not taking: Reported on 03/30/2021) 60 tablet 1   No  facility-administered medications prior to visit.     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 [facility])    H/O coccidioidomycosis    pulmonary    HIV disease ([place])    Past history of allergy to penicillin-type antibiotic 07-03-2012   desensitization   PNA (pneumonia)    Shigella gastroenteritis    Syphilis    history /treated      Past Surgical History:  Procedure Laterality Date   BIOPSY  10/16/2020   Procedure: BIOPSY;  Surgeon: Irving Copas., MD;  Location: [facility] ENDOSCOPY;  Service: Gastroenterology;;   BIOPSY  10/23/2020   Procedure: BIOPSY;  Surgeon: Jackquline Denmark, MD;  Location: [facility] ENDOSCOPY;  Service: Endoscopy;;   COLONOSCOPY WITH PROPOFOL N/A 10/23/2020   Procedure: COLONOSCOPY WITH PROPOFOL;  Surgeon: Jackquline Denmark, MD;  Location[facility] ENDOSCOPY;  Service: Endoscopy;  Laterality: N/A;   ESOPHAGOGASTRODUODENOSCOPY (EGD) WITH PROPOFOL N/A 10/16/2020   Procedure: ESOPHAGOGASTRODUODENOSCOPY (EGD) WITH PROPOFOL;  Surgeon: Rush Landmark Telford Nab., MD;  Location: [place];  Service: Gastroenterology;  Laterality: N/A;   TRANSURETHRAL RESECTION OF PROSTATE N/A 12/09/2017   Procedure: TRANSURETHRAL RESECTION OF THE PROSTATE (TURP);  Surgeon: Ardis Hughs, MD;  Location: [facility] ORS;  Service: Urology;  Laterality: N/A;      Review of Systems  Constitutional:  Negative for appetite change, chills, fatigue, fever and unexpected weight change.  Eyes:  Negative for visual disturbance.  Respiratory:  Negative for cough, chest tightness, shortness of breath and wheezing.   Cardiovascular:  Negative for chest pain and leg swelling.  Gastrointestinal:  Negative for abdominal pain, constipation, diarrhea, nausea and vomiting.  Genitourinary:  Negative for dysuria, flank pain, frequency, genital sores, hematuria  and urgency.  Skin:  Negative for rash.  Allergic/Immunologic: Negative for immunocompromised state.  Neurological:  Negative for dizziness and headaches.     Objective:    BP 105/70   Pulse (!) 105   Temp 98.5 F (36.9 C) (Oral)   Wt 170 lb (77.1 kg)   BMI 22.43 kg/m  Nursing note and vital signs reviewed.  Physical Exam Constitutional:      General: He is not in acute distress.    Appearance: He is well-developed.  Eyes:     Conjunctiva/sclera: Conjunctivae normal.  Cardiovascular:     Rate and Rhythm: Normal rate and regular rhythm.     Heart sounds: Normal heart sounds. No murmur heard.   No friction rub. No gallop.  Pulmonary:     Effort: Pulmonary effort is normal. No respiratory distress.     Breath sounds: Normal breath sounds. No wheezing or rales.  Chest:     Chest wall: No tenderness.  Abdominal:     General: Bowel sounds are normal.     Palpations: Abdomen is soft.     Tenderness: There is no abdominal tenderness.  Musculoskeletal:     Cervical back: Neck supple.  Lymphadenopathy:     Cervical: No cervical  adenopathy.  Skin:    General: Skin is warm and dry.     Findings: No rash.     Comments: Has peeling skin/calluses on his bilateral feet.   Neurological:     Mental Status: He is alert and oriented to person, place, and time.  Psychiatric:        Behavior: Behavior normal.        Thought Content: Thought content normal.        Judgment: Judgment normal.     Depression screen [facility] 2/9 03/30/2021 10/15/2020 10/08/2020 07/23/2020 10/24/2018  Decreased Interest 0 0 0 0 0  Down, Depressed, Hopeless 0 1 0 0 0  PHQ - 2 Score 0 1 0 0 0  Altered sleeping - - 3 - -  Tired, decreased energy - - 3 - -  Change in appetite - - 0 - -  Feeling bad or failure about yourself  - - 0 - -  Trouble concentrating - - 0 - -  Moving slowly or fidgety/restless - - 0 - -  Suicidal thoughts - - 0 - -  PHQ-9 Score - - 6 - -       Assessment & Plan:    Patient Active  Problem List   Diagnosis Date Noted   Pressure injury of skin 03/05/2021   Group A streptococcal infection    Sepsis due to group A Streptococcus without acute organ dysfunction (HCC)    Sepsis due to Escherichia coli (E. coli) (HCC) 03/04/2021   Lactic acidosis 03/04/2021   GERD (gastroesophageal reflux disease) 03/04/2021   ESBL (extended spectrum beta-lactamase) producing bacteria infection    Obstructive uropathy    Prerenal renal failure    Nonadherence to medical treatment    Acute kidney injury (HCC) 02/10/2021   Loose stools    Bilious vomiting with nausea    SIRS (systemic inflammatory response syndrome) (HCC) 02/09/2021   Hypokalemia 10/15/2020   Nausea, vomiting, and diarrhea 10/15/2020   Colitis 10/15/2020   Dysphagia 10/15/2020   COVID-19 virus infection 08/25/2020   Dark urine 08/25/2020   Healthcare maintenance 07/23/2020   Pyelonephritis 02/13/2019   Prostatitis 10/05/2018   Leukopenia 09/20/2018   Hyponatremia 09/20/2018   Avoidance coping 09/07/2018   Fever 04/28/2018   Headache 04/27/2018   Condyloma 04/24/2018   Protein-calorie malnutrition, severe 12/12/2017   Personal history of MRSA (methicillin resistant Staphylococcus aureus) 12/09/2017   History of ESBL E. 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  Has been referred to [facility] for assistance as needed.      AIDS (acquired immune deficiency syndrome) ([place]) - Primary    Jacob Gutierrez appears clinically improved since leaving the hospital.  This is probably the most  interactive/improved I have seen him in several office visits.  Discussed importance of continuing to take medications as prescribed.  No current signs/symptoms of new opportunistic infection.  He does remain at risk for opportunistic infection with most recent CD4 count of less than 35.  Check blood work today.  Continue current dose of  Symtuza supplemented with Bactrim for OI prophylaxis.  I am hoping his viral load is significantly improved with his improved adherence.  Met with [facility] to establish assistance.  Refer to dentist for routine dental care.  He will pick up medication from the clinic.  Plan for follow-up in 1 month or sooner if needed.      Relevant Medications   sulfamethoxazole-trimethoprim (BACTRIM DS) 800-160 MG tablet   Darunavir-Cobicistat-Emtricitabine-Tenofovir Alafenamide (SYMTUZA) 800-150-200-10 MG TABS   Other Relevant Orders   COMPLETE METABOLIC PANEL WITH GFR (Completed)   HIV-1 RNA quant-no reflex-bld   T-helper cell (CD4)- ([facility] clinic only)   Healthcare maintenance    Discussed importance of safe sexual practice to reduce risk of STI.  Condoms declined. Referral placed to [facility] dental clinic.  Will likely need to be undetectable prior to visit. He also met with research staff about potential interest in poor compliance study.      Group A streptococcal infection    Mr. Hawes completed treatment as prescribed on 7/94/8016 with no complications.  No current symptoms.  Problem appears to be resolved.      Relevant Medications   sulfamethoxazole-trimethoprim (BACTRIM DS) 800-160 MG tablet   Darunavir-Cobicistat-Emtricitabine-Tenofovir Alafenamide (SYMTUZA) 800-150-200-10 MG TABS     I am having Jacob Gutierrez maintain his loperamide, potassium chloride, pantoprazole, ferrous sulfate, lipase/protease/amylase, fluconazole, NIFEdipine, sulfamethoxazole-trimethoprim, and Symtuza.   Meds ordered this encounter  Medications    sulfamethoxazole-trimethoprim (BACTRIM DS) 800-160 MG tablet    Sig: Take 1 tablet by mouth 3 (three) times a week.    Dispense:  30 tablet    Refill:  2    Please send to [facility]    Order Specific Question:   Supervising Provider    Answer:   Carlyle Basques [[id number]]   Darunavir-Cobicistat-Emtricitabine-Tenofovir Alafenamide (SYMTUZA) 800-150-200-10 MG TABS    Sig: Take 1 tablet by mouth daily with breakfast.    Dispense:  30 tablet    Refill:  2    Please send to [facility]    Order Specific Question:   Supervising Provider    Answer:   Carlyle Basques [[id number]]     Follow-up: Return in about 1 month (around 04/29/2021), or if symptoms worsen or fail to improve.   [name], MSN, FNP-C Nurse Practitioner [facility] [place] number: [phone]

## 2021-03-31 LAB — T-HELPER CELL (CD4) - (RCID CLINIC ONLY)
CD4 % Helper T Cell: 4 % — ABNORMAL LOW (ref 33–65)
CD4 T Cell Abs: 48 /uL — ABNORMAL LOW (ref 400–1790)

## 2021-03-31 NOTE — Assessment & Plan Note (Signed)
Jacob Gutierrez appears clinically improved since leaving the hospital.  This is probably the most interactive/improved I have seen him in several office visits.  Discussed importance of continuing to take medications as prescribed.  No current signs/symptoms of new opportunistic infection.  He does remain at risk for opportunistic infection with most recent CD4 count of less than 35.  Check blood work today.  Continue current dose of Symtuza supplemented with Bactrim for OI prophylaxis.  I am hoping his viral load is significantly improved with his improved adherence.  Met with [facility] to establish assistance.  Refer to dentist for routine dental care.  He will pick up medication from the clinic.  Plan for follow-up in 1 month or sooner if needed.

## 2021-03-31 NOTE — Assessment & Plan Note (Signed)
   Discussed importance of safe sexual practice to reduce risk of STI.  Condoms declined.  Referral placed to [facility] dental clinic.  Will likely need to be undetectable prior to visit.  He also met with research staff about potential interest in poor compliance study.

## 2021-03-31 NOTE — Assessment & Plan Note (Signed)
Jacob Gutierrez completed treatment as prescribed on 03/14/2021 with no complications.  No current symptoms.  Problem appears to be resolved.

## 2021-03-31 NOTE — Assessment & Plan Note (Signed)
Currently staying with a friend.  Has been referred to [facility] for assistance as needed.

## 2021-04-01 LAB — COMPLETE METABOLIC PANEL WITH GFR
AG Ratio: 0.6 (calc) — ABNORMAL LOW (ref 1.0–2.5)
ALT: 17 U/L (ref 9–46)
AST: 23 U/L (ref 10–40)
Albumin: 3.4 g/dL — ABNORMAL LOW (ref 3.6–5.1)
Alkaline phosphatase (APISO): 103 U/L (ref 36–130)
BUN/Creatinine Ratio: 10 (calc) (ref 6–22)
BUN: 14 mg/dL (ref 7–25)
CO2: 27 mmol/L (ref 20–32)
Calcium: 8.7 mg/dL (ref 8.6–10.3)
Chloride: 100 mmol/L (ref 98–110)
Creat: 1.35 mg/dL — ABNORMAL HIGH (ref 0.60–1.26)
Globulin: 6.1 g/dL (calc) — ABNORMAL HIGH (ref 1.9–3.7)
Glucose, Bld: 74 mg/dL (ref 65–99)
Potassium: 3.8 mmol/L (ref 3.5–5.3)
Sodium: 132 mmol/L — ABNORMAL LOW (ref 135–146)
Total Bilirubin: 0.6 mg/dL (ref 0.2–1.2)
Total Protein: 9.5 g/dL — ABNORMAL HIGH (ref 6.1–8.1)
eGFR: 69 mL/min/{1.73_m2} (ref >=60)

## 2021-04-01 LAB — HIV-1 RNA QUANT-NO REFLEX-BLD
HIV 1 RNA Quant: 17100 Copies/mL — ABNORMAL HIGH
HIV-1 RNA Quant, Log: 4.23 Log cps/mL — ABNORMAL HIGH

## 2021-04-06 ENCOUNTER — Encounter: Payer: Medicaid Other | Admitting: *Deleted

## 2021-04-06 ENCOUNTER — Encounter (HOSPITAL_COMMUNITY): Payer: Self-pay | Admitting: Radiology

## 2021-04-08 ENCOUNTER — Other Ambulatory Visit: Payer: Self-pay

## 2021-04-08 ENCOUNTER — Encounter (INDEPENDENT_AMBULATORY_CARE_PROVIDER_SITE_OTHER): Payer: Self-pay | Admitting: *Deleted

## 2021-04-08 VITALS — BP 91/50 | HR 93 | Temp 98.0°F | Ht 73.0 in | Wt 167.4 lb

## 2021-04-08 DIAGNOSIS — Z006 Encounter for examination for normal comparison and control in clinical research program: Secondary | ICD-10-CM

## 2021-04-08 NOTE — Research (Signed)
Jacob Gutierrez came in today to screen for the A5359 study, [place]. He had reviewed the consent at home prior to this visit and we reviewed it together here. Informed consent was obtained after it was discussed fully and his questions answered. He has had very poor compliance with his HIV meds for a long time and was in the hospital last month for sepsis and now seems more engaged in his care. He says he has been taking his meds every day at bedtime since he got out of the hospital but does forget to take them sometimes. He denies any current problems except for occasional dental pain. His dentition is in poor condition and he cannot see the dental clinic until his virus is much better controlled. BP was 91/50 and apparently has had a hx of hypotension in the past. He does not have a PCP, so we will work on getting him in to the Internal Medicine center if he is eligible for study. We did tell him to hold off on taking his nifedipine with the low BP after discussing with Tresa Endo.

## 2021-04-09 LAB — URINALYSIS, ROUTINE W REFLEX MICROSCOPIC
Bilirubin Urine: NEGATIVE
Glucose, UA: NEGATIVE
Ketones, ur: NEGATIVE
Nitrite: NEGATIVE
Specific Gravity, Urine: 1.015 (ref 1.001–1.035)
Squamous Epithelial / HPF: NONE SEEN /HPF (ref <=5)
WBC, UA: >=60 /HPF — AB (ref 0–5)
pH: 6.5 (ref 5.0–8.0)

## 2021-04-09 LAB — MICROSCOPIC MESSAGE

## 2021-04-16 LAB — COMPREHENSIVE METABOLIC PANEL
AG Ratio: 0.6 (calc) — ABNORMAL LOW (ref 1.0–2.5)
ALT: 13 U/L (ref 9–46)
AST: 23 U/L (ref 10–40)
Albumin: 3.3 g/dL — ABNORMAL LOW (ref 3.6–5.1)
Alkaline phosphatase (APISO): 128 U/L (ref 36–130)
BUN/Creatinine Ratio: 12 (calc) (ref 6–22)
BUN: 15 mg/dL (ref 7–25)
CO2: 23 mmol/L (ref 20–32)
Calcium: 8.5 mg/dL — ABNORMAL LOW (ref 8.6–10.3)
Chloride: 104 mmol/L (ref 98–110)
Creat: 1.29 mg/dL — ABNORMAL HIGH (ref 0.60–1.26)
Globulin: 5.8 g/dL (calc) — ABNORMAL HIGH (ref 1.9–3.7)
Glucose, Bld: 67 mg/dL (ref 65–99)
Potassium: 3.9 mmol/L (ref 3.5–5.3)
Sodium: 134 mmol/L — ABNORMAL LOW (ref 135–146)
Total Bilirubin: 0.3 mg/dL (ref 0.2–1.2)
Total Protein: 9.1 g/dL — ABNORMAL HIGH (ref 6.1–8.1)

## 2021-04-16 LAB — HIV-1/2 AB - DIFFERENTIATION
HIV-1 antibody: POSITIVE — AB
HIV-2 Ab: NEGATIVE

## 2021-04-16 LAB — HEPATITIS B SURFACE ANTIBODY,QUALITATIVE: Hep B S Ab: NONREACTIVE

## 2021-04-16 LAB — HIV-1 RNA ULTRAQUANT REFLEX TO GENTYP+
HIV 1 RNA Quant: 7210 copies/mL — ABNORMAL HIGH
HIV-1 RNA Quant, Log: 3.86 Log copies/mL — ABNORMAL HIGH

## 2021-04-16 LAB — HEPATITIS B CORE ANTIBODY, TOTAL: Hep B Core Total Ab: REACTIVE — AB

## 2021-04-16 LAB — HIV ANTIBODY (ROUTINE TESTING W REFLEX): HIV 1&2 Ab, 4th Generation: REACTIVE — AB

## 2021-04-16 LAB — HIV-1 GENOTYPE: HIV-1 Genotype: DETECTED — AB

## 2021-04-16 LAB — BILIRUBIN, DIRECT: Bilirubin, Direct: 0.1 mg/dL (ref 0.0–0.2)

## 2021-04-27 ENCOUNTER — Other Ambulatory Visit (HOSPITAL_COMMUNITY): Payer: Self-pay

## 2021-04-27 ENCOUNTER — Encounter: Payer: Self-pay | Admitting: *Deleted

## 2021-04-27 ENCOUNTER — Other Ambulatory Visit: Payer: Self-pay

## 2021-04-27 ENCOUNTER — Other Ambulatory Visit: Payer: Self-pay | Admitting: Infectious Disease

## 2021-04-27 DIAGNOSIS — B20 Human immunodeficiency virus [HIV] disease: Secondary | ICD-10-CM

## 2021-04-27 DIAGNOSIS — Z006 Encounter for examination for normal comparison and control in clinical research program: Secondary | ICD-10-CM

## 2021-04-27 LAB — CD4/CD8 (T-HELPER/T-SUPPRESSOR CELL)
CD4 % Helper T Cell: 6.5
CD4 Count: 91
CD8 % Suppressor T Cell: 974
CD8 T Cell Abs: 69.6

## 2021-04-27 NOTE — Research (Signed)
Danzig came in today to get an HBV DNA PCR since his core was positive and an integrase genotype to check for eligibility for the [id number] study. He denies any new problems. Once his labs result ansd we can determine if he is eligible, then we will schedule an entry visit.

## 2021-04-27 NOTE — Progress Notes (Signed)
Putting in genottype wit [facility]

## 2021-04-29 ENCOUNTER — Other Ambulatory Visit (HOSPITAL_COMMUNITY): Payer: Self-pay

## 2021-04-29 ENCOUNTER — Other Ambulatory Visit: Payer: Self-pay

## 2021-04-29 ENCOUNTER — Encounter: Payer: Self-pay | Admitting: Family

## 2021-04-29 ENCOUNTER — Ambulatory Visit (INDEPENDENT_AMBULATORY_CARE_PROVIDER_SITE_OTHER): Payer: Medicaid Other | Admitting: Family

## 2021-04-29 VITALS — BP 99/65 | HR 68 | Wt 168.6 lb

## 2021-04-29 DIAGNOSIS — B20 Human immunodeficiency virus [HIV] disease: Secondary | ICD-10-CM

## 2021-04-29 DIAGNOSIS — Z0000 Encounter for general adult medical examination without abnormal findings: Secondary | ICD-10-CM

## 2021-04-29 NOTE — Patient Instructions (Signed)
Nice to see you.  We will check your lab work today.  Continue to take your medications daily.   Refills have been provided.   Plan for follow up in 1 month or sooner if needed.   Have a great day - keep up the great work!

## 2021-04-29 NOTE — Assessment & Plan Note (Addendum)
Jacob Gutierrez has good adherence and tolerance to his ART regimen of Symtuza. Clinically appears to be doing well. No signs/symptoms of opportunistic infection. Discussed importance of continuing to take medications as prescribed. Check lab work today. Medications delivered from the pharmacy provided. Continue current dose of Symtuza with OI prophylaxis with Bactrim. Plan for follow up in 1 month or sooner if needed with lab work on the same day.

## 2021-04-29 NOTE — Progress Notes (Signed)
Brief Narrative   Patient ID: Jacob Gutierrez, male    DOB: 12-22-1985, 35 y.o.   MRN: [id number]  Jacob Gutierrez is a 35 year old African-American male diagnosed with HIV-1 disease in 2013 with risk factor of MSM.  Initial CD4 nadir of 60.  Previous history of gonorrhea, pulmonary cocciodomycosis.  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  Previous ART regimen include Stribild with Prezista and now Symtuza.  Subjective:    Chief Complaint  Patient presents with   Follow-up    Declined condoms;     HPI:  Jacob Gutierrez is a 35 y.o. male with AIDS who was last seen on 04/08/21 with improved adherence and good tolerance to his ART regimen of Symtuza. Viral load at the time was 7,210 with CD4 of 91. Here today for routine follow up.   [name] has been taking his Symtuza and Bactrim as prescribed with no adverse side effects. Overall feeling well today with no new concerns/complaints. Denies fevers, chills, night sweats, headaches, changes in vision, neck pain/stiffness, nausea, diarrhea, vomiting, lesions or rashes.  [name] has no problems obtaining medication as he is picking them up from the [facility]. Denies feelings of being down, depressed or hopeless. No current recreational or illicit drug use, tobacco use or alcohol consumption. Eager to see the dentist to get his teeth taken care of. Working with [facility] for general medical care.     Allergies  Allergen Reactions   Vancomycin Anaphylaxis, Itching, Swelling and Other (See Comments)    Angioedema and "everything swells"   Amoxicillin Other (See Comments)    From childhood: "I had a reaction when i was little." (??) Has tolerated multiple cephalosporins in the past      Outpatient Medications Prior to Visit  Medication Sig Dispense Refill   Darunavir-Cobicistat-Emtricitabine-Tenofovir Alafenamide (SYMTUZA) 800-150-200-10 MG TABS Take 1 tablet by  mouth daily with breakfast. 30 tablet 2   sulfamethoxazole-trimethoprim (BACTRIM DS) 800-160 MG tablet Take 1 tablet by mouth 3 (three) times a week. 30 tablet 2   ferrous sulfate 325 (65 FE) MG tablet TAKE 1 TABLET (325 MG TOTAL) BY MOUTH DAILY AFTER BREAKFAST. (Patient not taking: No sig reported) 30 tablet 0   fluconazole (DIFLUCAN) 200 MG tablet Take 1 tablet (200 mg total) by mouth daily. (Patient not taking: No sig reported) 7 tablet 0   lipase/protease/amylase (CREON) 36000 UNITS CPEP capsule TAKE 2 CAPSULES (72,000 UNITS) BY MOUTH WITH EVERY MEAL, TAKE 1 CAPSULE (36,000 UNITS) BY MOUTH WITH EVERY SNACK. (Patient not taking: No sig reported) 240 capsule 11   loperamide (IMODIUM A-D) 2 MG tablet Take 1 tablet (2 mg total) by mouth every 8 (eight) hours. (Patient not taking: No sig reported) 30 tablet 5   NIFEdipine (ADALAT CC) 60 MG 24 hr tablet Take 1 tablet (60 mg total) by mouth daily. (Patient not taking: Reported on 04/29/2021) 30 tablet 0   pantoprazole (PROTONIX) 40 MG tablet TAKE 1 TABLET (40 MG TOTAL) BY MOUTH 2 (TWO) TIMES DAILY. (Patient not taking: No sig reported) 30 tablet 0   potassium chloride (KLOR-CON) 10 MEQ tablet TAKE 2 TABLETS BY MOUTH DAILY. (Patient not taking: No sig reported) 60 tablet 1   No facility-administered medications prior to visit.     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 [facility])    H/O coccidioidomycosis    pulmonary    HIV disease (HCC)    Past history of allergy to penicillin-type antibiotic 07-03-2012   desensitization   PNA (pneumonia)    Shigella gastroenteritis    Syphilis    history /treated      Past Surgical History:  Procedure Laterality Date   BIOPSY  10/16/2020   Procedure: BIOPSY;  Surgeon: Lemar Lofty., MD;  Location: [facility] ENDOSCOPY;  Service:  Gastroenterology;;   BIOPSY  10/23/2020   Procedure: BIOPSY;  Surgeon: Lynann Bologna, MD;  Location: [facility] ENDOSCOPY;  Service: Endoscopy;;   COLONOSCOPY WITH PROPOFOL N/A 10/23/2020   Procedure: COLONOSCOPY WITH PROPOFOL;  Surgeon: Lynann Bologna, MD;  Location: [facility] ENDOSCOPY;  Service: Endoscopy;  Laterality: N/A;   ESOPHAGOGASTRODUODENOSCOPY (EGD) WITH PROPOFOL N/A 10/16/2020   Procedure: ESOPHAGOGASTRODUODENOSCOPY (EGD) WITH PROPOFOL;  Surgeon: Meridee Score Netty Starring., MD;  Location: [facility] ENDOSCOPY;  Service: Gastroenterology;  Laterality: N/A;   TRANSURETHRAL RESECTION OF PROSTATE N/A 12/09/2017   Procedure: TRANSURETHRAL RESECTION OF THE PROSTATE (TURP);  Surgeon: Crist Fat, MD;  Location: [facility] ORS;  Service: Urology;  Laterality: N/A;      Review of Systems  Constitutional:  Negative for appetite change, chills, fatigue, fever and unexpected weight change.  Eyes:  Negative for visual disturbance.  Respiratory:  Negative for cough, chest tightness, shortness of breath and wheezing.   Cardiovascular:  Negative for chest pain and leg swelling.  Gastrointestinal:  Negative for abdominal pain, constipation, diarrhea, nausea and vomiting.  Genitourinary:  Negative for dysuria, flank pain, frequency, genital sores, hematuria and urgency.  Skin:  Negative for rash.  Allergic/Immunologic: Negative for immunocompromised state.  Neurological:  Negative for dizziness and headaches.     Objective:    BP 99/65   Pulse 68   Wt 168 lb 9.6 oz (76.5 kg)   BMI 22.24 kg/m  Nursing note and vital signs reviewed.  Physical Exam Constitutional:      General: He is not in acute distress.    Appearance: He is well-developed.  HENT:     Mouth/Throat:     Comments: Poor dentition Eyes:     Conjunctiva/sclera: Conjunctivae normal.  Cardiovascular:     Rate and Rhythm: Normal rate and regular rhythm.     Heart sounds: Normal heart sounds. No murmur heard.   No friction rub. No gallop.  Pulmonary:      Effort: Pulmonary effort is normal. No respiratory distress.     Breath sounds: Normal breath sounds. No wheezing or rales.  Chest:     Chest wall: No tenderness.  Abdominal:     General: Bowel sounds are normal.     Palpations: Abdomen is soft.     Tenderness: There is no abdominal tenderness.  Musculoskeletal:     Cervical back: Neck supple.  Lymphadenopathy:     Cervical: No cervical adenopathy.  Skin:    General: Skin is warm and dry.     Findings: No rash.  Neurological:     Mental Status: He is alert and oriented to person, place, and time.  Psychiatric:        Behavior: Behavior normal.        Thought Content: Thought content normal.        Judgment: Judgment normal.     Depression screen [facility] 2/9 04/29/2021 03/30/2021 10/15/2020 10/08/2020 07/23/2020  Decreased Interest 0 0 0 0 0  Down, Depressed, Hopeless 0 0 1 0 0  PHQ - 2 Score 0 0 1 0 0  Altered sleeping - - - 3 -  Tired, decreased energy - - - 3 -  Change in appetite - - - 0 -  Feeling bad or failure about yourself  - - - 0 -  Trouble concentrating - - - 0 -  Moving slowly or fidgety/restless - - - 0 -  Suicidal thoughts - - - 0 -  PHQ-9 Score - - - 6 -       Assessment & Plan:    Patient Active Problem List   Diagnosis Date Noted   Pressure injury of skin 03/05/2021   Group A streptococcal infection    Sepsis due to group A Streptococcus without acute organ dysfunction (HCC)    Sepsis due to Escherichia coli (E. coli) (HCC) 03/04/2021   Lactic acidosis 03/04/2021   GERD (gastroesophageal reflux disease) 03/04/2021   ESBL (extended spectrum beta-lactamase) producing bacteria infection    Obstructive uropathy    Prerenal renal failure    Nonadherence to medical treatment    Acute kidney injury (HCC) 02/10/2021   Loose stools    Bilious vomiting with nausea    SIRS (systemic inflammatory response syndrome) (HCC) 02/09/2021   Hypokalemia 10/15/2020   Nausea, vomiting, and diarrhea 10/15/2020    Colitis 10/15/2020   Dysphagia 10/15/2020   COVID-19 virus infection 08/25/2020   Dark urine 08/25/2020   Healthcare maintenance 07/23/2020   Pyelonephritis 02/13/2019   Prostatitis 10/05/2018   Leukopenia 09/20/2018   Hyponatremia 09/20/2018   Avoidance coping 09/07/2018   Fever 04/28/2018   Headache 04/27/2018   Condyloma 04/24/2018   Protein-calorie malnutrition, severe 12/12/2017   Personal history of MRSA (methicillin resistant Staphylococcus aureus) 12/09/2017   History of ESBL E. 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 Clinically appears to be doing well. No signs/symptoms of opportunistic infection. Discussed importance of continuing to take medications as prescribed. Check lab work today. Medications delivered from the pharmacy provided. Continue current dose of Symtuza with OI prophylaxis with Bactrim. Plan for follow up in 1 month or sooner if needed with lab work on the same day.       Relevant Orders   HIV-1 RNA quant-no reflex-bld   T-helper cell (CD4)- ([facility] clinic only)   Healthcare maintenance    Discussed importance of safe sexual practice and condom use. Condoms offered.  Due for routine dental care and awaiting viral load improvement.         I am having Jacob Gutierrez maintain his loperamide, potassium chloride, pantoprazole, ferrous sulfate, lipase/protease/amylase, fluconazole, NIFEdipine, sulfamethoxazole-trimethoprim, and Symtuza.    Follow-up: Return in about 1  month (around 05/30/2021), or if symptoms worsen or fail to improve.   Marcos Eke, MSN, FNP-C Nurse Practitioner [facility] Main number: [phone]

## 2021-04-29 NOTE — Assessment & Plan Note (Signed)
   Discussed importance of safe sexual practice and condom use. Condoms offered.   Due for routine dental care and awaiting viral load improvement.

## 2021-04-30 LAB — T-HELPER CELL (CD4) - (RCID CLINIC ONLY)
CD4 % Helper T Cell: 6 % — ABNORMAL LOW (ref 33–65)
CD4 T Cell Abs: <35 /uL — ABNORMAL LOW (ref 400–1790)

## 2021-05-01 LAB — HIV-1 RNA QUANT-NO REFLEX-BLD
HIV 1 RNA Quant: 1160000 Copies/mL — ABNORMAL HIGH
HIV-1 RNA Quant, Log: 6.06 Log cps/mL — ABNORMAL HIGH

## 2021-05-03 ENCOUNTER — Other Ambulatory Visit (HOSPITAL_COMMUNITY): Payer: Self-pay

## 2021-05-03 LAB — HIV-1 INTEGRASE GENOTYPE: Date Viral Load Collected: 10042022

## 2021-05-03 LAB — HEPATITIS B DNA, ULTRAQUANTITATIVE, PCR
Hepatitis B DNA (Calc): <1 Log IU/mL
Hepatitis B DNA: <10 IU/mL

## 2021-05-04 ENCOUNTER — Telehealth: Payer: Self-pay

## 2021-05-04 ENCOUNTER — Telehealth: Payer: Self-pay | Admitting: *Deleted

## 2021-05-04 NOTE — Telephone Encounter (Signed)
[facility] Patient Advocate Encounter ° °Patient's medications have been couriered to [facility] from [facility] and will be picked up . ° °Selicia Windom , CPhT °Specialty Pharmacy Patient Advocate °[facility] °Phone: [phone] °Fax:  [phone]  °

## 2021-05-04 NOTE — Telephone Encounter (Signed)
After seeing [name] for Screening, I noted that there was  a documentation saying that he had a legal guardian put in the chart on 02/10/21. Makenzie confirmed that was not true. I talked to his mother today, Tomasita Morrow who confirmed that was also not true and that there has been no court proceedings, etc to confirm he had any legal guardian and that he is his own responsibility. I had previously talked to both of his parents about the study prior to screening om 9/6.

## 2021-05-05 ENCOUNTER — Emergency Department (HOSPITAL_COMMUNITY): Payer: Medicaid Other

## 2021-05-05 ENCOUNTER — Encounter: Payer: Medicaid Other | Admitting: *Deleted

## 2021-05-05 ENCOUNTER — Telehealth: Payer: Self-pay | Admitting: *Deleted

## 2021-05-05 ENCOUNTER — Encounter (HOSPITAL_COMMUNITY): Payer: Self-pay | Admitting: Emergency Medicine

## 2021-05-05 ENCOUNTER — Emergency Department (HOSPITAL_COMMUNITY)
Admission: EM | Admit: 2021-05-05 | Discharge: 2021-05-05 | Disposition: A | Discharge status: Home / Self Care | Payer: Medicaid Other | Attending: Emergency Medicine | Admitting: Emergency Medicine

## 2021-05-05 DIAGNOSIS — Z8616 Personal history of COVID-19: Secondary | ICD-10-CM | POA: Diagnosis not present

## 2021-05-05 DIAGNOSIS — F17210 Nicotine dependence, cigarettes, uncomplicated: Secondary | ICD-10-CM | POA: Diagnosis not present

## 2021-05-05 DIAGNOSIS — B20 Human immunodeficiency virus [HIV] disease: Secondary | ICD-10-CM | POA: Diagnosis not present

## 2021-05-05 DIAGNOSIS — R1011 Right upper quadrant pain: Secondary | ICD-10-CM | POA: Diagnosis present

## 2021-05-05 DIAGNOSIS — K5900 Constipation, unspecified: Secondary | ICD-10-CM | POA: Insufficient documentation

## 2021-05-05 DIAGNOSIS — N3000 Acute cystitis without hematuria: Secondary | ICD-10-CM

## 2021-05-05 DIAGNOSIS — K219 Gastro-esophageal reflux disease without esophagitis: Secondary | ICD-10-CM | POA: Insufficient documentation

## 2021-05-05 LAB — COMPREHENSIVE METABOLIC PANEL
ALT: 20 U/L (ref 0–44)
AST: 28 U/L (ref 15–41)
Albumin: 3.2 g/dL — ABNORMAL LOW (ref 3.5–5.0)
Alkaline Phosphatase: 108 U/L (ref 38–126)
Anion gap: 11 (ref 5–15)
BUN: 12 mg/dL (ref 6–20)
CO2: 18 mmol/L — ABNORMAL LOW (ref 22–32)
Calcium: 8.9 mg/dL (ref 8.9–10.3)
Chloride: 101 mmol/L (ref 98–111)
Creatinine, Ser: 1.21 mg/dL (ref 0.61–1.24)
GFR, Estimated: >60 mL/min (ref >60)
Glucose, Bld: 90 mg/dL (ref 70–99)
Potassium: 3.4 mmol/L — ABNORMAL LOW (ref 3.5–5.1)
Sodium: 130 mmol/L — ABNORMAL LOW (ref 135–145)
Total Bilirubin: 0.6 mg/dL (ref 0.3–1.2)
Total Protein: 9.8 g/dL — ABNORMAL HIGH (ref 6.5–8.1)

## 2021-05-05 LAB — URINALYSIS, ROUTINE W REFLEX MICROSCOPIC
Bilirubin Urine: NEGATIVE
Glucose, UA: NEGATIVE mg/dL
Ketones, ur: NEGATIVE mg/dL
Nitrite: POSITIVE — AB
Protein, ur: 30 mg/dL — AB
RBC / HPF: >50 RBC/hpf — ABNORMAL HIGH (ref 0–5)
Specific Gravity, Urine: 1.015 (ref 1.005–1.030)
WBC, UA: >50 WBC/hpf — ABNORMAL HIGH (ref 0–5)
pH: 6 (ref 5.0–8.0)

## 2021-05-05 LAB — CBC WITH DIFFERENTIAL/PLATELET
Abs Immature Granulocytes: 0.36 10*3/uL — ABNORMAL HIGH (ref 0.00–0.07)
Basophils Absolute: 0 10*3/uL (ref 0.0–0.1)
Basophils Relative: 1 %
Eosinophils Absolute: 0.2 10*3/uL (ref 0.0–0.5)
Eosinophils Relative: 5 %
HCT: 35.5 % — ABNORMAL LOW (ref 39.0–52.0)
Hemoglobin: 11.8 g/dL — ABNORMAL LOW (ref 13.0–17.0)
Immature Granulocytes: 11 %
Lymphocytes Relative: 13 %
Lymphs Abs: 0.5 10*3/uL — ABNORMAL LOW (ref 0.7–4.0)
MCH: 31.7 pg (ref 26.0–34.0)
MCHC: 33.2 g/dL (ref 30.0–36.0)
MCV: 95.4 fL (ref 80.0–100.0)
Monocytes Absolute: 0.7 10*3/uL (ref 0.1–1.0)
Monocytes Relative: 20 %
Neutro Abs: 1.8 10*3/uL (ref 1.7–7.7)
Neutrophils Relative %: 50 %
Platelets: 195 10*3/uL (ref 150–400)
RBC: 3.72 MIL/uL — ABNORMAL LOW (ref 4.22–5.81)
RDW: 15.9 % — ABNORMAL HIGH (ref 11.5–15.5)
WBC: 3.4 10*3/uL — ABNORMAL LOW (ref 4.0–10.5)
nRBC: 0 % (ref 0.0–0.2)

## 2021-05-05 LAB — LIPASE, BLOOD: Lipase: 50 U/L (ref 11–51)

## 2021-05-05 MED ORDER — CEPHALEXIN 500 MG PO CAPS
500.0000 mg | ORAL_CAPSULE | Freq: Four times a day (QID) | ORAL | 0 refills | Status: DC
  Filled 2021-05-05: qty 40, 10d supply, fill #0

## 2021-05-05 MED ORDER — IOHEXOL 350 MG/ML SOLN
75.0000 mL | Freq: Once | INTRAVENOUS | Status: AC | PRN
  Administered 2021-05-05: 75 mL via INTRAVENOUS

## 2021-05-05 MED ORDER — CEPHALEXIN 500 MG PO CAPS: 500.0000 mg | ORAL_CAPSULE | Freq: Four times a day (QID) | ORAL | 0 refills | Status: AC

## 2021-05-05 MED ORDER — POLYETHYLENE GLYCOL 3350 17 G PO PACK
17.0000 g | PACK | Freq: Every day | ORAL | 0 refills | Status: DC
Start: 2021-05-05 — End: 2021-05-05
  Filled 2021-05-05: qty 14, 14d supply, fill #0

## 2021-05-05 MED ORDER — POLYETHYLENE GLYCOL 3350 17 G PO PACK: 17.0000 g | PACK | Freq: Every day | ORAL | 0 refills | Status: DC

## 2021-05-05 NOTE — Telephone Encounter (Signed)
Called pt at both listed numbers. No answer, Left VM to call back

## 2021-05-05 NOTE — ED Provider Notes (Signed)
Emergency Medicine Provider Triage Evaluation Note  Jacob Gutierrez , a 35 y.o. male  was evaluated in triage.  Pt complains of nausea and vomiting.  Patient presents with 2 days of nausea and vomiting.  He has not been to keep much food down.  Nothing is made it better or worse.  His vomiting is usually dry heaving so is not getting much up.  He also states he has not had a bowel movement in over 7 days.  He has a history of cholecystitis recently which no believe he had surgery on, HIV, AIDS.  Denies any abdominal surgery.  Review of Systems  Positive: Nausea, vomiting, abdominal pain, constipation Negative: Fever, chest pain, shortness of breath  Physical Exam  BP 107/79 (BP Location: Right Arm)   Pulse (!) 114   Temp 99.1 F (37.3 C) (Oral)   Resp 18   SpO2 98%  Gen:   Awake, no distress.  Clean dry heaving. Resp:  Normal effort  MSK:   Moves extremities without difficulty  Other:  Abdomen is soft with no rebound or guarding.  He does have right upper quadrant tenderness to palpation.  Murphy sign positive.  Medical Decision Making  Medically screening exam initiated at 10:12 AM.  Appropriate orders placed.  Jacob Gutierrez was informed that the remainder of the evaluation will be completed by another provider, this initial triage assessment does not replace that evaluation, and the importance of remaining in the ED until their evaluation is complete.     Therese Sarah 05/05/21 1014    [name], MD 05/05/21 1328

## 2021-05-05 NOTE — Discharge Instructions (Addendum)
You are seen in the emergency department for abdominal pain and pain when you are urinating.  We found that you have a UTI.  I will be giving a medication called Keflex which is an antibiotic that you will take 4 times a day for the next 10 days.  It is very important that you initiate and fully complete these antibiotics as you are immunocompromised, that you are immune system does not work as well as others.  The antibiotics will help your body fight the infection.  Additionally it does appear that you have some stool in your colon however you do not have a bowel obstruction.  I am prescribing you MiraLAX.  Please take this until you begin having daily bowel movements.

## 2021-05-05 NOTE — Social Work (Signed)
CSW met with Pt at bedside. Pt states that he has Medicaid and that he is able to pay $3 copay for prescriptions. Pt asked that prescriptions be transferred to [facility] on [place], [place] updated.  Per Pt, he is able to drive to [facility] to pick up meds.

## 2021-05-05 NOTE — ED Triage Notes (Signed)
Pt endorses constipation for over a week. Reports he is going to the bathroom multiple times a day but just small amounts of liquid is coming out.

## 2021-05-05 NOTE — Telephone Encounter (Signed)
Copied from CRM [id number]. Topic: General - Inquiry >> Apr 29, 2021 11:52 AM Daphine Deutscher D wrote: Reason for CRM: Pt called and would like some clarification on medications that he is supposed to be taking .  He is not sure because he stopped for a while and needs to know if he still needs to be taking them.  [phone]

## 2021-05-05 NOTE — ED Provider Notes (Signed)
Care assumed from [place], [place] at this time.  Please see his note for full work-up of patient.  Briefly this is a 35 year old gentleman with a history of AIDS who presents emergency department with abdominal pain and constipation for the past 7 days.  Also complains of dysuria. Physical Exam  BP 101/64   Pulse 85   Temp 99.1 F (37.3 C) (Oral)   Resp 17   SpO2 99%   Physical Exam Vitals and nursing note reviewed.  Constitutional:      General: He is not in acute distress. HENT:     Head: Normocephalic and atraumatic.  Eyes:     General: No scleral icterus. Pulmonary:     Effort: Pulmonary effort is normal. No respiratory distress.  Skin:    Findings: No rash.  Neurological:     General: No focal deficit present.     Mental Status: He is alert.  Psychiatric:        Mood and Affect: Mood normal.        Behavior: Behavior normal.        Thought Content: Thought content normal.        Judgment: Judgment normal.   Please see providers complete physical exam ED Course/Procedures  A positive for UTI, I ordered urine culture given that he is immunosuppressed. Hemoglobin 11.8 (increased from previous) Right upper quadrant ultrasound with gallbladder sludge without acute cholecystitis Abdomen x-ray inconclusive so obtaining CT abdomen pelvis. CT abdomen pelvis without obstruction.  Does show that he has bladder wall thickening consistent with his acute cystitis.  Procedures  MDM  Prescribing Keflex 10-day course. Prescribing MiraLAX for constipation. Instructed to return should he begin to have fevers, increasing abdominal pain. Also needs follow-up with infectious disease.  We will have him follow-up within the next week.  Last notes that he is compliant on his medications, however it appears that his last viral quant is 1.4 million with low CD4 count.       Cristopher Peru, PA-C 05/05/21 1735    Gerhard Munch, MD 05/06/21 [phone]

## 2021-05-05 NOTE — ED Provider Notes (Signed)
[facility] EMERGENCY DEPARTMENT Provider Note   CSN: [id number] Arrival date & time: 05/05/21  2542     History Chief Complaint  Patient presents with   Constipation    Jacob Gutierrez is a 35 y.o. male with history of constipation who presents to the emergency department today for constipation for the last 7 days.  He denies any nausea or vomiting, fever, or chills. Endorses intermittent RUQ abdominal pain. He states he drinks a lot of soda and juice and does not have any intake of fiber.  He also complains of dysuria towards the end of his stream.  No hematuria or flank pain.  Eating makes his constipation worse and has associated anorexia.  The history is provided by the patient. No language interpreter was used.  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 coli) (HCC) 03/04/2021   Lactic acidosis 03/04/2021   GERD (gastroesophageal reflux disease) 03/04/2021   ESBL (extended spectrum beta-lactamase) producing bacteria infection    Obstructive uropathy    Prerenal renal failure    Nonadherence to medical treatment    Acute kidney injury (HCC) 02/10/2021   Loose stools    Bilious vomiting with nausea    SIRS (systemic inflammatory response syndrome) (HCC) 02/09/2021   Hypokalemia 10/15/2020    Nausea, vomiting, and diarrhea 10/15/2020   Colitis 10/15/2020   Dysphagia 10/15/2020   COVID-19 virus infection 08/25/2020   Dark urine 08/25/2020   Healthcare maintenance 07/23/2020   Pyelonephritis 02/13/2019   Prostatitis 10/05/2018   Leukopenia 09/20/2018   Hyponatremia 09/20/2018   Avoidance coping 09/07/2018   Fever 04/28/2018   Headache 04/27/2018   Condyloma 04/24/2018   Protein-calorie malnutrition, severe 12/12/2017   Personal history of MRSA (methicillin resistant Staphylococcus aureus) 12/09/2017   History of ESBL E. 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   Syphilis, unspecified 09/12/2016   Dysplasia of anus 01/02/2014   Tobacco use disorder 01/02/2014   Human immunodeficiency virus (HIV) disease (HCC) 01/01/2014    Past Surgical History:  Procedure Laterality Date   BIOPSY  10/16/2020   Procedure: BIOPSY;  Surgeon: Lemar Lofty., MD;  Location: [facility] ENDOSCOPY;  Service: Gastroenterology;;   BIOPSY  10/23/2020   Procedure: BIOPSY;  Surgeon: Lynann Bologna, MD;  Location: [facility] ENDOSCOPY;  Service: Endoscopy;;   COLONOSCOPY WITH PROPOFOL N/A 10/23/2020   Procedure: COLONOSCOPY WITH PROPOFOL;  Surgeon: Lynann Bologna, MD;  Location: [facility] ENDOSCOPY;  Service: Endoscopy;  Laterality: N/A;   ESOPHAGOGASTRODUODENOSCOPY (EGD) WITH PROPOFOL N/A 10/16/2020   Procedure: ESOPHAGOGASTRODUODENOSCOPY (EGD) WITH PROPOFOL;  Surgeon: Meridee Score Netty Starring., MD;  Location: [facility] ENDOSCOPY;  Service:  Gastroenterology;  Laterality: N/A;   TRANSURETHRAL RESECTION OF PROSTATE N/A 12/09/2017   Procedure: TRANSURETHRAL RESECTION OF THE PROSTATE (TURP);  Surgeon: Crist Fat, MD;  Location: [facility] ORS;  Service: Urology;  Laterality: N/A;       Family History  Problem Relation Age of Onset   Hypertension Mother    Lupus Maternal Grandmother    Diabetes  Maternal Grandmother    Cancer Paternal Grandfather        unknown    Prostate cancer Father    Diabetes Father    Diabetes Maternal Grandfather    Colon cancer Neg Hx    Stomach cancer Neg Hx    Esophageal cancer Neg Hx    Pancreatic cancer Neg Hx     Social History   Tobacco Use   Smoking status: Every Day    Packs/day: 0.30    Types: Cigarettes   Smokeless tobacco: Never   Tobacco comments:    cutting back  Vaping Use   Vaping Use: Never used  Substance Use Topics   Alcohol use: Not Currently    Alcohol/week: 1.0 standard drink    Types: 1 Standard drinks or equivalent per week    Comment: whiskey occasionally    Drug use: Not Currently    Frequency: 1.0 times per week    Types: Marijuana    Home Medications Prior to Admission medications   Medication Sig Start Date End Date Taking? Authorizing Provider  Darunavir-Cobicistat-Emtricitabine-Tenofovir Alafenamide (SYMTUZA) 800-150-200-10 MG TABS Take 1 tablet by mouth daily with breakfast. 03/30/21  Yes Veryl Speak, FNP  sulfamethoxazole-trimethoprim (BACTRIM DS) 800-160 MG tablet Take 1 tablet by mouth 3 (three) times a week. 03/31/21  Yes Veryl Speak, FNP  ferrous sulfate 325 (65 FE) MG tablet TAKE 1 TABLET (325 MG TOTAL) BY MOUTH DAILY AFTER BREAKFAST. Patient not taking: No sig reported 10/23/20 10/23/21  Maretta Bees, MD  fluconazole (DIFLUCAN) 200 MG tablet Take 1 tablet (200 mg total) by mouth daily. Patient not taking: No sig reported 02/15/21   Amin, Loura Halt, MD  lipase/protease/amylase (CREON) 36000 UNITS CPEP capsule TAKE 2 CAPSULES (72,000 UNITS) BY MOUTH WITH EVERY MEAL, TAKE 1 CAPSULE (36,000 UNITS) BY MOUTH WITH EVERY SNACK. Patient not taking: No sig reported 09/25/20 09/25/21  Unk Lightning, PA  loperamide (IMODIUM A-D) 2 MG tablet Take 1 tablet (2 mg total) by mouth every 8 (eight) hours. Patient not taking: No sig reported 09/18/20   Unk Lightning, PA  NIFEdipine (ADALAT CC)  60 MG 24 hr tablet Take 1 tablet (60 mg total) by mouth daily. Patient not taking: No sig reported 03/11/21   Leatha Gilding, MD  pantoprazole (PROTONIX) 40 MG tablet TAKE 1 TABLET (40 MG TOTAL) BY MOUTH 2 (TWO) TIMES DAILY. Patient not taking: No sig reported 10/23/20 10/23/21  Ghimire, Werner Lean, MD  potassium chloride (KLOR-CON) 10 MEQ tablet TAKE 2 TABLETS BY MOUTH DAILY. Patient not taking: No sig reported 10/14/20 10/14/21  Anders Simmonds, PA-C    Allergies    Vancomycin and Amoxicillin  Review of Systems   Review of Systems  Gastrointestinal:  Positive for constipation.  All other systems reviewed and are negative.  Physical Exam Updated Vital Signs BP 101/64   Pulse 85   Temp 99.1 F (37.3 C) (Oral)   Resp 17   SpO2 99%   Physical Exam Vitals and nursing note reviewed.  Constitutional:      General: He is not in acute  distress.    Appearance: Normal appearance.  HENT:     Head: Normocephalic and atraumatic.  Eyes:     General:        Right eye: No discharge.        Left eye: No discharge.  Cardiovascular:     Comments: Regular rate and rhythm.  S1/S2 are distinct without any evidence of murmur, rubs, or gallops.  Radial pulses are 2+ bilaterally.  Dorsalis pedis pulses are 2+ bilaterally.  No evidence of pedal edema. Pulmonary:     Comments: Clear to auscultation bilaterally.  Normal effort.  No respiratory distress.  No evidence of wheezes, rales, or rhonchi heard throughout. Abdominal:     Comments: Abdomen is flat and nondistended.  There is mild right upper quadrant tenderness to light palpation.  The rest of the abdomen is nontender.  No guarding or rebound.  No CVA tenderness.  Bowel sounds are decreased.  Musculoskeletal:        General: Normal range of motion.     Cervical back: Neck supple.  Skin:    General: Skin is warm and dry.     Findings: No rash.  Neurological:     General: No focal deficit present.     Mental Status: He is alert.   Psychiatric:        Mood and Affect: Mood normal.        Behavior: Behavior normal.    ED Results / Procedures / Treatments   Labs (all labs ordered are listed, but only abnormal results are displayed) Labs Reviewed  CBC WITH DIFFERENTIAL/PLATELET - Abnormal; Notable for the following components:      Result Value   WBC 3.4 (*)    RBC 3.72 (*)    Hemoglobin 11.8 (*)    HCT 35.5 (*)    RDW 15.9 (*)    Lymphs Abs 0.5 (*)    Abs Immature Granulocytes 0.36 (*)    All other components within normal limits  COMPREHENSIVE METABOLIC PANEL - Abnormal; Notable for the following components:   Sodium 130 (*)    Potassium 3.4 (*)    CO2 18 (*)    Total Protein 9.8 (*)    Albumin 3.2 (*)    All other components within normal limits  URINALYSIS, ROUTINE W REFLEX MICROSCOPIC - Abnormal; Notable for the following components:   APPearance CLOUDY (*)    Hgb urine dipstick LARGE (*)    Protein, ur 30 (*)    Nitrite POSITIVE (*)    Leukocytes,Ua LARGE (*)    RBC / HPF >50 (*)    WBC, UA >50 (*)    Bacteria, UA MANY (*)    All other components within normal limits  URINE CULTURE  LIPASE, BLOOD    EKG None  Radiology DG Abdomen 1 View  Result Date: 05/05/2021 CLINICAL DATA:  Patient has not used the bathroom in over a week. EXAM: ABDOMEN - 1 VIEW COMPARISON:  June 10, 2011 FINDINGS: Air is seen throughout the majority of the large bowel with an abrupt absence of bowel gas noted within the mid to distal transverse colon. Stool is not clearly identified within this region. The visualized small bowel loops are unremarkable. No radio-opaque calculi or other significant radiographic abnormality are seen. IMPRESSION: Abrupt absence of bowel gas and stool within the transverse colon which is nonspecific in nature. Sequelae associated with marked severity colitis or fecal impaction cannot be excluded. Correlation with abdomen and pelvis CT is recommended. Electronically Signed  By: Aram Candela M.D.   On: 05/05/2021 15:03   US Abdomen Limited RUQ (LIVER/GB)  Result Date: 05/05/2021 CLINICAL DATA:  Right upper quadrant pain for 1 month EXAM: ULTRASOUND ABDOMEN LIMITED RIGHT UPPER QUADRANT COMPARISON:  03/03/2021 FINDINGS: Gallbladder: Incompletely distended gallbladder. Gallbladder wall thickness is upper limits of normal at 2.9 mm. Sludge identified dependently. No stones, pericholecystic fluid or sonographic Murphy's sign. Common bile duct: Diameter: 2.7 mm Liver: No focal lesion identified. Within normal limits in parenchymal echogenicity. Portal vein is patent on color Doppler imaging with normal direction of blood flow towards the liver. Other: None. IMPRESSION: Gallbladder sludge. No secondary signs of acute cholecystitis. Electronically Signed   By: Signa Kell M.D.   On: 05/05/2021 11:37    Procedures Procedures   Medications Ordered in ED Medications - No data to display  ED Course  I have reviewed the triage vital signs and the nursing notes.  Pertinent labs & imaging results that were available during my care of the patient were reviewed by me and considered in my medical decision making (see chart for details).    MDM Rules/Calculators/A&P                          HARSHIL CAVALLARO is a 35 y.o. male with history of HIV who presents to the emergency department for further evaluation of constipation.  History and physical exam is somewhat concerning for small bowel obstruction.  He had decreased bowel sounds with early tinkling.  Initial work-up was ordered in triage where he was found to have a urinary tract infection.  He is on Bactrim prophylactically for pneumocystis Jirovecii.  Will add ciprofloxacin upon disposition for UTI treatment.   CBC reveals chronic ongoing anemia.  CMP reveals hyponatremia and mild hypokalemia.  Lipase negative.  Urinalysis showed UTI.  Will obtain culture given his immunocompromise status.  Ultrasound did not reveal any  cholecystitis.  Abdominal x-ray revealed a normal gas pattern.  CT abdomen pending for small bowel obstruction rule out.  If CT abdomen shows small bowel obstruction he will likely need to be admitted for further evaluation and possible bowel decompression.  CT abdomen is negative he can be safely discharged home with MiraLAX regimen and ciprofloxacin for UTI.  Respess care was transferred to Mertha Baars, PA-C   Final Clinical Impression(s) / ED Diagnoses Final diagnoses:  RUQ pain    Rx / DC Orders ED Discharge Orders     None        Teressa Lower, [place] 05/05/21 1618    Margarita Grizzle, MD 05/06/21 1557

## 2021-05-05 NOTE — ED Notes (Signed)
Patient transported to CT 

## 2021-05-05 NOTE — Discharge Planning (Signed)
RNCM consulted regarding medication assistance for insured pt.  Pt Rx co-pay is $3/Rx.  TOC will assist with co-pay via [employer].

## 2021-05-06 ENCOUNTER — Other Ambulatory Visit (HOSPITAL_COMMUNITY): Payer: Self-pay

## 2021-05-08 LAB — URINE CULTURE: Culture: >=100000 — AB

## 2021-05-09 ENCOUNTER — Telehealth: Payer: Self-pay | Admitting: Emergency Medicine

## 2021-05-09 NOTE — Progress Notes (Signed)
ED Antimicrobial Stewardship Positive Culture Follow Up   Jacob Gutierrez is an 35 y.o. male who presented to [facility] on 05/05/2021 with a chief complaint of  Chief Complaint  Patient presents with   Constipation    Recent Results (from the past 720 hour(s))  Urine Culture     Status: Abnormal   Collection Time: 05/05/21 10:10 AM   Specimen: Urine, Clean Catch  Result Value Ref Range Status   Specimen Description URINE, CLEAN CATCH  Final   Special Requests   Final    NONE Performed at [facility], [address] [place], [place] [address]    Culture (A)  Final    >=100,000 COLONIES/mL ESCHERICHIA COLI Confirmed Extended Spectrum Beta-Lactamase Producer (ESBL).  In bloodstream infections from ESBL organisms, carbapenems are preferred over piperacillin/tazobactam. They are shown to have a lower risk of mortality.    Report Status 05/08/2021 FINAL  Final   Organism ID, Bacteria ESCHERICHIA COLI (A)  Final      Susceptibility   Escherichia coli - MIC*    AMPICILLIN >=32 RESISTANT Resistant     CEFAZOLIN >=64 RESISTANT Resistant     CEFEPIME 16 RESISTANT Resistant     CEFTRIAXONE >=64 RESISTANT Resistant     CIPROFLOXACIN >=4 RESISTANT Resistant     GENTAMICIN >=16 RESISTANT Resistant     IMIPENEM <=0.25 SENSITIVE Sensitive     NITROFURANTOIN <=16 SENSITIVE Sensitive     TRIMETH/SULFA >=320 RESISTANT Resistant     AMPICILLIN/SULBACTAM >=32 RESISTANT Resistant     PIP/TAZO <=4 SENSITIVE Sensitive     * >=100,000 COLONIES/mL ESCHERICHIA COLI    [x]  Treated with Keflex, organism resistant to prescribed antimicrobial   New antibiotic prescription: Macrobid  ED Provider: , PA-C   Jodi Geralds 05/09/2021, 10:27 AM Clinical Pharmacist Monday - Friday phone -  [phone] Saturday - Sunday phone - [phone]

## 2021-05-09 NOTE — Telephone Encounter (Signed)
Post ED Visit - Positive Culture Follow-up: Unsuccessful Patient Follow-up  Culture assessed and recommendations reviewed by:  []  , Pharm.D. []  Enzo Bi, Pharm.D., BCPS AQ-ID []  , Pharm.D., BCPS []  Celedonio Miyamoto, .D., BCPS []  [employer], .D., BCPS, AAHIVP []  Georgina Pillion, Pharm.D., BCPS, AAHIVP [x]  [address], PharmD []  , PharmD, BCPS  Positive urine culture  []  Patient discharged without antimicrobial prescription and treatment is now indicated [x]  Organism is resistant to prescribed ED discharge antimicrobial []  Patient with positive blood cultures   Unable to contact patient by phone on file, letter will be sent to address on file  Plan: D/C Keflex Start Macrobid 100 mg PO BID x seven days. [place] PA  [place] RN 05/09/2021, 6:28 PM

## 2021-05-12 ENCOUNTER — Other Ambulatory Visit (HOSPITAL_COMMUNITY): Payer: Self-pay

## 2021-05-14 NOTE — Telephone Encounter (Signed)
Left message on voicemail to return call.

## 2021-05-19 ENCOUNTER — Other Ambulatory Visit: Payer: Self-pay

## 2021-05-19 ENCOUNTER — Encounter (HOSPITAL_COMMUNITY): Payer: Self-pay | Admitting: Emergency Medicine

## 2021-05-19 ENCOUNTER — Other Ambulatory Visit (HOSPITAL_COMMUNITY): Payer: Self-pay

## 2021-05-19 ENCOUNTER — Inpatient Hospital Stay (HOSPITAL_COMMUNITY)
Admission: EM | Admit: 2021-05-19 | Discharge: 2021-05-25 | DRG: 975 | Disposition: A | Discharge status: Home / Self Care | Payer: Medicaid Other | Attending: Internal Medicine | Admitting: Internal Medicine

## 2021-05-19 DIAGNOSIS — Z79899 Other long term (current) drug therapy: Secondary | ICD-10-CM

## 2021-05-19 DIAGNOSIS — K5900 Constipation, unspecified: Secondary | ICD-10-CM | POA: Diagnosis present

## 2021-05-19 DIAGNOSIS — I10 Essential (primary) hypertension: Secondary | ICD-10-CM

## 2021-05-19 DIAGNOSIS — E871 Hypo-osmolality and hyponatremia: Secondary | ICD-10-CM | POA: Diagnosis present

## 2021-05-19 DIAGNOSIS — R350 Frequency of micturition: Secondary | ICD-10-CM | POA: Diagnosis present

## 2021-05-19 DIAGNOSIS — F17210 Nicotine dependence, cigarettes, uncomplicated: Secondary | ICD-10-CM | POA: Diagnosis present

## 2021-05-19 DIAGNOSIS — R1310 Dysphagia, unspecified: Secondary | ICD-10-CM | POA: Diagnosis present

## 2021-05-19 DIAGNOSIS — Z833 Family history of diabetes mellitus: Secondary | ICD-10-CM

## 2021-05-19 DIAGNOSIS — Z8249 Family history of ischemic heart disease and other diseases of the circulatory system: Secondary | ICD-10-CM

## 2021-05-19 DIAGNOSIS — L89152 Pressure ulcer of sacral region, stage 2: Secondary | ICD-10-CM | POA: Diagnosis present

## 2021-05-19 DIAGNOSIS — K1370 Unspecified lesions of oral mucosa: Secondary | ICD-10-CM

## 2021-05-19 DIAGNOSIS — Z8042 Family history of malignant neoplasm of prostate: Secondary | ICD-10-CM

## 2021-05-19 DIAGNOSIS — D509 Iron deficiency anemia, unspecified: Secondary | ICD-10-CM | POA: Diagnosis present

## 2021-05-19 DIAGNOSIS — Z880 Allergy status to penicillin: Secondary | ICD-10-CM

## 2021-05-19 DIAGNOSIS — N10 Acute tubulo-interstitial nephritis: Secondary | ICD-10-CM | POA: Diagnosis present

## 2021-05-19 DIAGNOSIS — L299 Pruritus, unspecified: Secondary | ICD-10-CM | POA: Diagnosis present

## 2021-05-19 DIAGNOSIS — N12 Tubulo-interstitial nephritis, not specified as acute or chronic: Principal | ICD-10-CM

## 2021-05-19 DIAGNOSIS — Z20822 Contact with and (suspected) exposure to covid-19: Secondary | ICD-10-CM | POA: Diagnosis present

## 2021-05-19 DIAGNOSIS — B04 Monkeypox: Secondary | ICD-10-CM

## 2021-05-19 DIAGNOSIS — A4151 Sepsis due to Escherichia coli [E. coli]: Principal | ICD-10-CM | POA: Diagnosis present

## 2021-05-19 DIAGNOSIS — Z1612 Extended spectrum beta lactamase (ESBL) resistance: Secondary | ICD-10-CM | POA: Diagnosis present

## 2021-05-19 DIAGNOSIS — B20 Human immunodeficiency virus [HIV] disease: Secondary | ICD-10-CM | POA: Diagnosis present

## 2021-05-19 DIAGNOSIS — E876 Hypokalemia: Secondary | ICD-10-CM | POA: Diagnosis present

## 2021-05-19 DIAGNOSIS — K529 Noninfective gastroenteritis and colitis, unspecified: Secondary | ICD-10-CM | POA: Diagnosis present

## 2021-05-19 DIAGNOSIS — Z9114 Patient's other noncompliance with medication regimen: Secondary | ICD-10-CM

## 2021-05-19 DIAGNOSIS — K6289 Other specified diseases of anus and rectum: Secondary | ICD-10-CM | POA: Diagnosis present

## 2021-05-19 LAB — URINALYSIS, ROUTINE W REFLEX MICROSCOPIC
Bilirubin Urine: NEGATIVE
Glucose, UA: NEGATIVE mg/dL
Hgb urine dipstick: NEGATIVE
Ketones, ur: NEGATIVE mg/dL
Nitrite: POSITIVE — AB
Protein, ur: NEGATIVE mg/dL
Specific Gravity, Urine: 1.012 (ref 1.005–1.030)
WBC, UA: >50 WBC/hpf — ABNORMAL HIGH (ref 0–5)
pH: 6 (ref 5.0–8.0)

## 2021-05-19 LAB — BASIC METABOLIC PANEL
Anion gap: 8 (ref 5–15)
BUN: 12 mg/dL (ref 6–20)
CO2: 22 mmol/L (ref 22–32)
Calcium: 8.6 mg/dL — ABNORMAL LOW (ref 8.9–10.3)
Chloride: 100 mmol/L (ref 98–111)
Creatinine, Ser: 0.94 mg/dL (ref 0.61–1.24)
GFR, Estimated: >60 mL/min (ref >60)
Glucose, Bld: 87 mg/dL (ref 70–99)
Potassium: 3.5 mmol/L (ref 3.5–5.1)
Sodium: 130 mmol/L — ABNORMAL LOW (ref 135–145)

## 2021-05-19 LAB — CBC WITH DIFFERENTIAL/PLATELET
Abs Immature Granulocytes: 0 10*3/uL (ref 0.00–0.07)
Basophils Absolute: 0 10*3/uL (ref 0.0–0.1)
Basophils Relative: 0 %
Eosinophils Absolute: 0.1 10*3/uL (ref 0.0–0.5)
Eosinophils Relative: 2 %
HCT: 31.5 % — ABNORMAL LOW (ref 39.0–52.0)
Hemoglobin: 10.7 g/dL — ABNORMAL LOW (ref 13.0–17.0)
Lymphocytes Relative: 25 %
Lymphs Abs: 1.7 10*3/uL (ref 0.7–4.0)
MCH: 32.2 pg (ref 26.0–34.0)
MCHC: 34 g/dL (ref 30.0–36.0)
MCV: 94.9 fL (ref 80.0–100.0)
Monocytes Absolute: 0.9 10*3/uL (ref 0.1–1.0)
Monocytes Relative: 13 %
Neutro Abs: 4.1 10*3/uL (ref 1.7–7.7)
Neutrophils Relative %: 60 %
Platelets: 160 10*3/uL (ref 150–400)
RBC: 3.32 MIL/uL — ABNORMAL LOW (ref 4.22–5.81)
RDW: 15.6 % — ABNORMAL HIGH (ref 11.5–15.5)
WBC: 6.9 10*3/uL (ref 4.0–10.5)
nRBC: 0 % (ref 0.0–0.2)
nRBC: 0 /100 WBC

## 2021-05-19 MED ORDER — ACETAMINOPHEN 500 MG PO TABS: 500.0000 mg | ORAL_TABLET | Freq: Once | ORAL | Status: DC

## 2021-05-19 MED ORDER — ACETAMINOPHEN 325 MG PO TABS
650.0000 mg | ORAL_TABLET | Freq: Once | ORAL | Status: AC
  Administered 2021-05-20: 650 mg via ORAL
  Filled 2021-05-19: qty 2

## 2021-05-19 NOTE — ED Provider Notes (Signed)
Emergency Medicine Provider Triage Evaluation Note  CARLOUS OLIVARES , a 35 y.o. male  was evaluated in triage.  Pt complains of rectal pain and swelling, constipation for several days.  Patient states that his pain is made worse sitting directly on his bottom.  Notes that he is seen small amounts of blood in his stool when he is able to have a bowel movement.  Of note history of HIV, AIDS, was hospitalized on 8/10 for sepsis related to E. coli.  Review of Systems  Positive: Rectal pain, swelling, constipation, chills, increased urinary frequency Negative: Abdominal pain, nausea, vomiting  Physical Exam  BP 107/83 (BP Location: Right Arm)   Pulse (!) 108   Temp 99.5 F (37.5 C)   Resp 18   SpO2 100%  Gen:   Awake, no distress   Resp:  Normal effort  MSK:   Moves extremities without difficulty  Other:    Medical Decision Making  Medically screening exam initiated at 3:33 PM.  Appropriate orders placed.  HALDEN PHEGLEY was informed that the remainder of the evaluation will be completed by another provider, this initial triage assessment does not replace that evaluation, and the importance of remaining in the ED until their evaluation is complete.     Jeanella Flattery 05/19/21 1534    Margarita Grizzle, MD 05/20/21 [phone]

## 2021-05-19 NOTE — ED Triage Notes (Signed)
Patient here complaint of swelling at his rectum and constipation for several days, states that occasionally he squirts stool but only small amounts. Patient also reports multiple small bumps on his left torso and bilateral upper and lower extremities.

## 2021-05-20 ENCOUNTER — Emergency Department (HOSPITAL_COMMUNITY): Payer: Medicaid Other

## 2021-05-20 ENCOUNTER — Encounter (HOSPITAL_COMMUNITY): Payer: Self-pay | Admitting: Internal Medicine

## 2021-05-20 ENCOUNTER — Other Ambulatory Visit: Payer: Self-pay

## 2021-05-20 DIAGNOSIS — B04 Monkeypox: Secondary | ICD-10-CM | POA: Diagnosis present

## 2021-05-20 DIAGNOSIS — N12 Tubulo-interstitial nephritis, not specified as acute or chronic: Secondary | ICD-10-CM | POA: Diagnosis present

## 2021-05-20 DIAGNOSIS — I10 Essential (primary) hypertension: Secondary | ICD-10-CM

## 2021-05-20 DIAGNOSIS — K6289 Other specified diseases of anus and rectum: Secondary | ICD-10-CM | POA: Diagnosis present

## 2021-05-20 DIAGNOSIS — Z880 Allergy status to penicillin: Secondary | ICD-10-CM | POA: Diagnosis not present

## 2021-05-20 DIAGNOSIS — A4151 Sepsis due to Escherichia coli [E. coli]: Secondary | ICD-10-CM | POA: Diagnosis present

## 2021-05-20 DIAGNOSIS — Z8249 Family history of ischemic heart disease and other diseases of the circulatory system: Secondary | ICD-10-CM | POA: Diagnosis not present

## 2021-05-20 DIAGNOSIS — Z9114 Patient's other noncompliance with medication regimen: Secondary | ICD-10-CM | POA: Diagnosis not present

## 2021-05-20 DIAGNOSIS — K529 Noninfective gastroenteritis and colitis, unspecified: Secondary | ICD-10-CM | POA: Diagnosis present

## 2021-05-20 DIAGNOSIS — K5900 Constipation, unspecified: Secondary | ICD-10-CM | POA: Diagnosis present

## 2021-05-20 DIAGNOSIS — F17210 Nicotine dependence, cigarettes, uncomplicated: Secondary | ICD-10-CM | POA: Diagnosis present

## 2021-05-20 DIAGNOSIS — N10 Acute tubulo-interstitial nephritis: Secondary | ICD-10-CM | POA: Diagnosis present

## 2021-05-20 DIAGNOSIS — Z1612 Extended spectrum beta lactamase (ESBL) resistance: Secondary | ICD-10-CM | POA: Diagnosis present

## 2021-05-20 DIAGNOSIS — Z79899 Other long term (current) drug therapy: Secondary | ICD-10-CM | POA: Diagnosis not present

## 2021-05-20 DIAGNOSIS — R1310 Dysphagia, unspecified: Secondary | ICD-10-CM | POA: Diagnosis present

## 2021-05-20 DIAGNOSIS — Z833 Family history of diabetes mellitus: Secondary | ICD-10-CM | POA: Diagnosis not present

## 2021-05-20 DIAGNOSIS — R21 Rash and other nonspecific skin eruption: Secondary | ICD-10-CM | POA: Diagnosis not present

## 2021-05-20 DIAGNOSIS — R350 Frequency of micturition: Secondary | ICD-10-CM | POA: Diagnosis present

## 2021-05-20 DIAGNOSIS — Z20822 Contact with and (suspected) exposure to covid-19: Secondary | ICD-10-CM | POA: Diagnosis present

## 2021-05-20 DIAGNOSIS — D509 Iron deficiency anemia, unspecified: Secondary | ICD-10-CM | POA: Diagnosis present

## 2021-05-20 DIAGNOSIS — L299 Pruritus, unspecified: Secondary | ICD-10-CM | POA: Diagnosis present

## 2021-05-20 DIAGNOSIS — B20 Human immunodeficiency virus [HIV] disease: Secondary | ICD-10-CM | POA: Diagnosis present

## 2021-05-20 DIAGNOSIS — L89152 Pressure ulcer of sacral region, stage 2: Secondary | ICD-10-CM | POA: Diagnosis present

## 2021-05-20 DIAGNOSIS — Z8042 Family history of malignant neoplasm of prostate: Secondary | ICD-10-CM | POA: Diagnosis not present

## 2021-05-20 DIAGNOSIS — E871 Hypo-osmolality and hyponatremia: Secondary | ICD-10-CM | POA: Diagnosis present

## 2021-05-20 LAB — LACTIC ACID, PLASMA
Lactic Acid, Venous: 0.7 mmol/L (ref 0.5–1.9)
Lactic Acid, Venous: 1 mmol/L (ref 0.5–1.9)

## 2021-05-20 LAB — PROTIME-INR
INR: 1.2 (ref 0.8–1.2)
Prothrombin Time: 14.8 seconds (ref 11.4–15.2)

## 2021-05-20 LAB — CBC
HCT: 33.7 % — ABNORMAL LOW (ref 39.0–52.0)
Hemoglobin: 11.1 g/dL — ABNORMAL LOW (ref 13.0–17.0)
MCH: 31.7 pg (ref 26.0–34.0)
MCHC: 32.9 g/dL (ref 30.0–36.0)
MCV: 96.3 fL (ref 80.0–100.0)
Platelets: 146 10*3/uL — ABNORMAL LOW (ref 150–400)
RBC: 3.5 MIL/uL — ABNORMAL LOW (ref 4.22–5.81)
RDW: 15.9 % — ABNORMAL HIGH (ref 11.5–15.5)
WBC: 6 10*3/uL (ref 4.0–10.5)
nRBC: 0 % (ref 0.0–0.2)

## 2021-05-20 LAB — RESP PANEL BY RT-PCR (FLU A&B, COVID) ARPGX2
Influenza A by PCR: NEGATIVE
Influenza B by PCR: NEGATIVE
SARS Coronavirus 2 by RT PCR: NEGATIVE

## 2021-05-20 MED ORDER — SODIUM CHLORIDE 0.9 % IV SOLN
1.0000 g | Freq: Three times a day (TID) | INTRAVENOUS | Status: DC
  Administered 2021-05-20 – 2021-05-23 (×9): 1 g via INTRAVENOUS
  Filled 2021-05-20 (×13): qty 1

## 2021-05-20 MED ORDER — PANTOPRAZOLE SODIUM 40 MG PO TBEC
40.0000 mg | DELAYED_RELEASE_TABLET | Freq: Two times a day (BID) | ORAL | Status: DC
  Administered 2021-05-20 – 2021-05-25 (×10): 40 mg via ORAL
  Filled 2021-05-20 (×10): qty 1

## 2021-05-20 MED ORDER — DARUN-COBIC-EMTRICIT-TENOFAF 800-150-200-10 MG PO TABS
1.0000 | ORAL_TABLET | Freq: Every day | ORAL | Status: DC
  Administered 2021-05-20 – 2021-05-25 (×6): 1 via ORAL
  Filled 2021-05-20 (×9): qty 1

## 2021-05-20 MED ORDER — MORPHINE SULFATE (PF) 2 MG/ML IV SOLN
2.0000 mg | INTRAVENOUS | Status: DC | PRN
  Administered 2021-05-22: 2 mg via INTRAVENOUS
  Filled 2021-05-20: qty 1

## 2021-05-20 MED ORDER — OXYCODONE HCL 5 MG PO TABS
5.0000 mg | ORAL_TABLET | ORAL | Status: DC | PRN
Start: 2021-05-20 — End: 2021-05-25
  Administered 2021-05-20 – 2021-05-24 (×10): 5 mg via ORAL
  Filled 2021-05-20 (×12): qty 1

## 2021-05-20 MED ORDER — ENOXAPARIN SODIUM 40 MG/0.4ML IJ SOSY
40.0000 mg | PREFILLED_SYRINGE | INTRAMUSCULAR | Status: DC
  Administered 2021-05-20 – 2021-05-24 (×5): 40 mg via SUBCUTANEOUS
  Filled 2021-05-20 (×5): qty 0.4

## 2021-05-20 MED ORDER — SULFAMETHOXAZOLE-TRIMETHOPRIM 800-160 MG PO TABS
1.0000 | ORAL_TABLET | ORAL | Status: DC
  Administered 2021-05-20 – 2021-05-22 (×2): 1 via ORAL
  Filled 2021-05-20 (×2): qty 1

## 2021-05-20 MED ORDER — ACETAMINOPHEN 650 MG RE SUPP: 650.0000 mg | Freq: Four times a day (QID) | RECTAL | Status: DC | PRN

## 2021-05-20 MED ORDER — IOHEXOL 300 MG/ML  SOLN
100.0000 mL | Freq: Once | INTRAMUSCULAR | Status: AC | PRN
  Administered 2021-05-20: 100 mL via INTRAVENOUS

## 2021-05-20 MED ORDER — METRONIDAZOLE 500 MG/100ML IV SOLN
500.0000 mg | Freq: Two times a day (BID) | INTRAVENOUS | Status: DC
  Administered 2021-05-20 – 2021-05-21 (×2): 500 mg via INTRAVENOUS
  Filled 2021-05-20 (×2): qty 100

## 2021-05-20 MED ORDER — ACETAMINOPHEN 325 MG PO TABS
650.0000 mg | ORAL_TABLET | Freq: Four times a day (QID) | ORAL | Status: DC | PRN
  Administered 2021-05-20 – 2021-05-24 (×7): 650 mg via ORAL
  Filled 2021-05-20 (×7): qty 2

## 2021-05-20 MED ORDER — SODIUM CHLORIDE 0.9 % IV SOLN
1.0000 g | Freq: Once | INTRAVENOUS | Status: AC
  Administered 2021-05-20: 1 g via INTRAVENOUS
  Filled 2021-05-20: qty 1

## 2021-05-20 MED ORDER — POLYETHYLENE GLYCOL 3350 17 G PO PACK
17.0000 g | PACK | Freq: Every day | ORAL | Status: DC
  Administered 2021-05-20 – 2021-05-23 (×4): 17 g via ORAL
  Filled 2021-05-20 (×5): qty 1

## 2021-05-20 NOTE — Plan of Care (Signed)

## 2021-05-20 NOTE — ED Notes (Signed)
Patient ambulated to bathroom with one assist.

## 2021-05-20 NOTE — Progress Notes (Signed)
PROGRESS NOTE    Jacob Gutierrez  JYN:[id number] DOB: 1986/07/16 DOA: 05/19/2021 PCP: Patient, No Pcp Per (Inactive)    Brief Narrative:  Jacob Gutierrez was admitted to the hospital working diagnosis of sepsis due to proctocolitis and urinary tract infection.  35 year old male past medical history for HIV/AIDS, hypertension, substance abuse.  He has history of gonorrhea, syphilis and pulmonary coccidiomycosis.  He presented with rectal pain for several days, he was having difficulty sitting down or laying on his back.  No fevers no chills. He had frequent hospitalizations in the past for severe infections.  On his initial physical examination he was febrile 100.4 F, blood pressure 126/80, heart rate 93, respirate 17, oxygen saturation 100%, his lungs are clear to auscultation, heart S1-S2, present, rhythmic, soft abdomen, no lower extremity edema.  Papulopustular rash in his left flank region.  Sodium 130, potassium 3.5, chloride 100, bicarb 22, glucose 87, BUN 12, creatinine 0.94, white count 6.9, hemoglobin 10.7, hematocrit 31.5, platelets 160 SARS COVID-19 negative.  Urinalysis specific gravity 1.012,> 50 white cells.  CT of the abdomen with proctocolitis underlying malignancy not excluded.  Heterogeneous enhancement of the right renal parenchyma.  No drainable fluid collection.  EKG 80 bpm, normal axis, no intervals, sinus rhythm, no significant ST segment or T wave changes, positive LVH.  Assessment & Plan:   Principal Problem:   Proctocolitis Active Problems:   AIDS (acquired immune deficiency syndrome) (HCC)   HTN (hypertension)   Sepsis due to acute proctatitis, present on admission. Urine infection (ESBL E Coli) Patient continue to have recta pain, no nausea or vomiting, but poor oral intake.   Plan to continue antibiotic therapy with meropenem to cover E coli ESBL.  Will add metronidazole to cover anaerobes.  Continue pain control with IV morphine and oral  oxycodone. Supportive IV fluids  Stool softeners  2. HIV AIDS. Patient will resume his antiretroviral therapy.  Prophylaxis with bactrim.   3. HTN. Continue blood pressure monitoring, currently off antihypertensive medications.   4. Iron deficiency anemia. Patient not longer taking oral iron supplementation Follow hgb   Patient continue to be at high risk for worsening sepsis   Status is: Inpatient  Remains inpatient appropriate because: IV antibiotics   DVT prophylaxis: Enoxaparin   Code Status:    full  Family Communication:   No family at the bedside     Antimicrobials:  Meropenem and metronidazole     Subjective: Patient continue to have rectal pain, no nausea or vomiting, no chest pain or dyspnea,   Objective: Vitals:   05/20/21 0630 05/20/21 1100 05/20/21 1243 05/20/21 1500  BP: 124/85 113/73 126/80 (!) 136/91  Pulse: 81 (!) 106 93 89  Resp: 15 20 17 14   Temp:      TempSrc:      SpO2: 99% 99% 100% 100%    Intake/Output Summary (Last 24 hours) at 05/20/2021 1510 Last data filed at 05/20/2021 05/22/2021 Gross per 24 hour  Intake 220 ml  Output 500 ml  Net -280 ml   There were no vitals filed for this visit.  Examination:   General:  Deconditioned and in pain Neurology: Awake and alert, non focal  E ENT: no pallor, no icterus, oral mucosa moist Cardiovascular: No JVD. S1-S2 present, rhythmic, no gallops, rubs, or murmurs. No lower extremity edema. Pulmonary: positive breath sounds bilaterally, adequate air movement, no wheezing, rhonchi or rales. Gastrointestinal. Abdomen soft and non tender Skin. No rashes Musculoskeletal: no joint deformities  Data Reviewed: I have personally reviewed following labs and imaging studies  CBC: Recent Labs  Lab 05/19/21 1533 05/20/21 0920  WBC 6.9 6.0  NEUTROABS 4.1  --   HGB 10.7* 11.1*  HCT 31.5* 33.7*  MCV 94.9 96.3  PLT 160 146*   Basic Metabolic Panel: Recent Labs  Lab 05/19/21 1533  NA 130*  K  3.5  CL 100  CO2 22  GLUCOSE 87  BUN 12  CREATININE 0.94  CALCIUM 8.6*   GFR: CrCl cannot be calculated (Unknown ideal weight.). Liver Function Tests: No results for input(s): AST, ALT, ALKPHOS, BILITOT, PROT, ALBUMIN in the last 168 hours. No results for input(s): LIPASE, AMYLASE in the last 168 hours. No results for input(s): AMMONIA in the last 168 hours. Coagulation Profile: Recent Labs  Lab 05/20/21 0017  INR 1.2   Cardiac Enzymes: No results for input(s): CKTOTAL, CKMB, CKMBINDEX, TROPONINI in the last 168 hours. BNP (last 3 results) No results for input(s): PROBNP in the last 8760 hours. HbA1C: No results for input(s): HGBA1C in the last 72 hours. CBG: No results for input(s): GLUCAP in the last 168 hours. Lipid Profile: No results for input(s): CHOL, HDL, LDLCALC, TRIG, CHOLHDL, LDLDIRECT in the last 72 hours. Thyroid Function Tests: No results for input(s): TSH, T4TOTAL, FREET4, T3FREE, THYROIDAB in the last 72 hours. Anemia Panel: No results for input(s): VITAMINB12, FOLATE, FERRITIN, TIBC, IRON, RETICCTPCT in the last 72 hours.    Radiology Studies: I have reviewed all of the imaging during this hospital visit personally     Scheduled Meds:  Darunavir-Cobicistat-Emtricitabine-Tenofovir Alafenamide  1 tablet Oral Q breakfast   enoxaparin (LOVENOX) injection  40 mg Subcutaneous Q24H   sulfamethoxazole-trimethoprim  1 tablet Oral Q T,Th,Sat-1800   Continuous Infusions:  meropenem (MERREM) IV Stopped (05/20/21 1335)     LOS: 0 days        [name] Annett Gula, MD

## 2021-05-20 NOTE — ED Notes (Signed)
Juice provided.

## 2021-05-20 NOTE — ED Triage Notes (Signed)
Pt re-evaluated by PIT provider. PA Mia requests acuity increased to 2.

## 2021-05-20 NOTE — ED Provider Notes (Signed)
35 year old male with history of AIDS (viral load 720 and CD4 91 on 04/08/21) presenting with worsening rectal pain over the last few days.  He did not know that he had a fever until he arrived in the emergency department today, suspects onset today.  He has noticed a small amount of leakage stool and bloody drainage from the rectum.  Chaperoned exam in triage.  Perianal induration with minimal fluctuance.  No redness or warmth.  Tenderness to palpation to the perianal region.  There is also a scaling, scattered maculopapular rash noted to the left flank.  He reports that the same rash is present on his bilateral feet and has been present for the last few days.  No vesicles, fluctuance, erythema, warmth, red streaking, bulla, petechiae.  The remainder of the sepsis order set has been ordered.  I reviewed the patient's most recent CT abdomen pelvis, but given worsening rectal pain with his exam in the setting of fever, will repeat CT today.  Tylenol has been given for pain and fever.  He will require further work-up and evaluation in the emergency department.  I have previously discussed the patient's acuity with charge and triage RN, and he was advised to be made a level 2 ESI's since he meets sepsis criteria.  Unfortunately, there are no beds available at this time due to long wait times from boarding.     Barkley Boards, PA-C 05/20/21 0012    Melene Plan, DO 05/20/21 [phone]

## 2021-05-20 NOTE — H&P (Signed)
History and Physical    Jacob Gutierrez WVP:[id number] DOB: 03/01/1986 DOA: 05/19/2021  PCP: Patient, No Pcp Per (Inactive) Patient coming from: Home  Chief Complaint: Rectal pain  HPI: Jacob Gutierrez is a 35 y.o. male with medical history significant of HIV/AIDS (CD4 count <35 on 04/29/2021), medication noncompliance, hypertension, substance abuse, history of gonorrhea, syphilis, pulmonary coccidioidomycosis.  Admitted in July 2022 for sepsis secondary to pyelonephritis secondary to ESBL E. coli.  Admitted again in August 2022 for sepsis secondary to group B strep bacteremia.  He presents to the ED complaining of rectal pain.  Febrile with temperature 100.4 F, slightly tachycardic.  Labs showing WBC 6.9, hemoglobin 10.7 (no significant change from baseline), platelet count 160k.  Sodium 130 (chronically low and at baseline), potassium 3.5, chloride 100, bicarb 22, BUN 12, creatinine 0.9, glucose 87.  UA with positive nitrite, large amount of leukocytes, greater than 50 WBCs, and many bacteria.  Urine and blood cultures pending.  Lactic acid normal x2.  INR 1.2.  COVID and influenza PCR negative.  CT abdomen pelvis showing findings consistent with proctocolitis; no drainable fluid collection or abscess.  Underlying malignancy not excluded.  Further evaluation with sigmoidoscopy recommended after resolution of acute symptoms and inflammatory changes.  Heterogenous enhancement of the right renal parenchyma; no drainable fluid collection.  Retroperitoneal and perirectal adenopathy. Patient was given meropenem and Tylenol.  Patient is complaining of rectal pain and swelling for several days.  Also complaining of a rash in his left flank region.  It is difficult for him to sit down or lay on his back due to rectal pain.  Difficult to have bowel movements.  Denies fevers, nausea, vomiting, or abdominal pain.  Denies flank pain.  Denies cough, shortness of breath, or chest pain.  He is taking his HIV  medications but stopped taking his blood pressure medication.  Review of Systems:  All systems reviewed and apart from history of presenting illness, are negative.  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 [facility])    H/O coccidioidomycosis    pulmonary    HIV disease (HCC)    Past history of allergy to penicillin-type antibiotic 07-03-2012   desensitization   PNA (pneumonia)    Shigella gastroenteritis    Syphilis    history /treated     Past Surgical History:  Procedure Laterality Date   BIOPSY  10/16/2020   Procedure: BIOPSY;  Surgeon: Lemar Lofty., MD;  Location: [facility] ENDOSCOPY;  Service: Gastroenterology;;   BIOPSY  10/23/2020   Procedure: BIOPSY;  Surgeon: Lynann Bologna, MD;  Location: [facility] ENDOSCOPY;  Service: Endoscopy;;   COLONOSCOPY WITH PROPOFOL N/A 10/23/2020   Procedure: COLONOSCOPY WITH PROPOFOL;  Surgeon: Lynann Bologna, MD;  Location: [facility] ENDOSCOPY;  Service: Endoscopy;  Laterality: N/A;   ESOPHAGOGASTRODUODENOSCOPY (EGD) WITH PROPOFOL N/A 10/16/2020   Procedure: ESOPHAGOGASTRODUODENOSCOPY (EGD) WITH PROPOFOL;  Surgeon: Meridee Score Netty Starring., MD;  Location: [facility] ENDOSCOPY;  Service: Gastroenterology;  Laterality: N/A;   TRANSURETHRAL RESECTION OF PROSTATE N/A 12/09/2017   Procedure: TRANSURETHRAL RESECTION OF THE PROSTATE (TURP);  Surgeon: Crist Fat, MD;  Location: [facility] ORS;  Service: Urology;  Laterality: N/A;     reports that he has been smoking cigarettes. He has been smoking an average of .3 packs per day. He has never used smokeless tobacco. He reports that he does  not currently use alcohol after a past usage of about 1.0 standard drink per week. He reports that he does not currently use drugs after having used the following drugs: Marijuana. Frequency: 1.00 time per week.  Allergies   Allergen Reactions   Vancomycin Anaphylaxis, Itching, Swelling and Other (See Comments)    Angioedema and "everything swells"   Amoxicillin Other (See Comments)    From childhood: "I had a reaction when i was little." (??) Has tolerated multiple cephalosporins in the past    Family History  Problem Relation Age of Onset   Hypertension Mother    Lupus Maternal Grandmother    Diabetes Maternal Grandmother    Cancer Paternal Grandfather        unknown    Prostate cancer Father    Diabetes Father    Diabetes Maternal Grandfather    Colon cancer Neg Hx    Stomach cancer Neg Hx    Esophageal cancer Neg Hx    Pancreatic cancer Neg Hx     Prior to Admission medications   Medication Sig Start Date End Date Taking? Authorizing Provider  acetaminophen (TYLENOL) 500 MG tablet Take 1,000 mg by mouth every 6 (six) hours as needed for moderate pain or headache.   Yes [provider]  Darunavir-Cobicistat-Emtricitabine-Tenofovir Alafenamide (SYMTUZA) 800-150-200-10 MG TABS Take 1 tablet by mouth daily with breakfast. 03/30/21  Yes Veryl Speak, FNP  sulfamethoxazole-trimethoprim (BACTRIM DS) 800-160 MG tablet Take 1 tablet by mouth 3 (three) times a week. Patient taking differently: Take 1 tablet by mouth See admin instructions. Tuesday,Thursday,saturday 03/31/21  Yes Veryl Speak, FNP  ferrous sulfate 325 (65 FE) MG tablet TAKE 1 TABLET (325 MG TOTAL) BY MOUTH DAILY AFTER BREAKFAST. Patient not taking: No sig reported 10/23/20 10/23/21  Maretta Bees, MD  fluconazole (DIFLUCAN) 200 MG tablet Take 1 tablet (200 mg total) by mouth daily. Patient not taking: No sig reported 02/15/21   Amin, Loura Halt, MD  lipase/protease/amylase (CREON) 36000 UNITS CPEP capsule TAKE 2 CAPSULES (72,000 UNITS) BY MOUTH WITH EVERY MEAL, TAKE 1 CAPSULE (36,000 UNITS) BY MOUTH WITH EVERY SNACK. Patient not taking: No sig reported 09/25/20 09/25/21  Unk Lightning, PA  loperamide (IMODIUM A-D) 2  MG tablet Take 1 tablet (2 mg total) by mouth every 8 (eight) hours. Patient not taking: No sig reported 09/18/20   Unk Lightning, PA  NIFEdipine (ADALAT CC) 60 MG 24 hr tablet Take 1 tablet (60 mg total) by mouth daily. Patient not taking: No sig reported 03/11/21   Leatha Gilding, MD  pantoprazole (PROTONIX) 40 MG tablet TAKE 1 TABLET (40 MG TOTAL) BY MOUTH 2 (TWO) TIMES DAILY. Patient not taking: No sig reported 10/23/20 10/23/21  Maretta Bees, MD  polyethylene glycol (MIRALAX) 17 g packet Take 17 g by mouth daily. Patient not taking: Reported on 05/20/2021 05/05/21   Cristopher Peru, PA-C  potassium chloride (KLOR-CON) 10 MEQ tablet TAKE 2 TABLETS BY MOUTH DAILY. Patient not taking: No sig reported 10/14/20 10/14/21  Anders Simmonds, PA-C    Physical Exam: Vitals:   05/20/21 0200 05/20/21 0327 05/20/21 0430 05/20/21 0445  BP: 124/77 126/76 (!) 142/85 137/90  Pulse: 81 90 75 77  Resp: 16 18 15 15   Temp:  98 F (36.7 C)    TempSrc:  Oral    SpO2: 97% 99% 98% 98%    Physical Exam Constitutional:      General: He is not in acute distress. HENT:  Head: Normocephalic and atraumatic.  Eyes:     Extraocular Movements: Extraocular movements intact.     Conjunctiva/sclera: Conjunctivae normal.  Cardiovascular:     Rate and Rhythm: Normal rate and regular rhythm.     Pulses: Normal pulses.  Pulmonary:     Effort: Pulmonary effort is normal. No respiratory distress.     Breath sounds: Normal breath sounds. No wheezing or rales.  Abdominal:     General: Bowel sounds are normal. There is no distension.     Palpations: Abdomen is soft.     Tenderness: There is no abdominal tenderness.  Musculoskeletal:        General: No swelling or tenderness.     Cervical back: Normal range of motion and neck supple.  Skin:    General: Skin is warm and dry.     Comments: Papulopustular rash noted in the left flank region  Neurological:     General: No focal deficit present.      Mental Status: He is alert and oriented to person, place, and time.     Labs on Admission: I have personally reviewed following labs and imaging studies  CBC: Recent Labs  Lab 05/19/21 1533  WBC 6.9  NEUTROABS 4.1  HGB 10.7*  HCT 31.5*  MCV 94.9  PLT 160   Basic Metabolic Panel: Recent Labs  Lab 05/19/21 1533  NA 130*  K 3.5  CL 100  CO2 22  GLUCOSE 87  BUN 12  CREATININE 0.94  CALCIUM 8.6*   GFR: CrCl cannot be calculated (Unknown ideal weight.). Liver Function Tests: No results for input(s): AST, ALT, ALKPHOS, BILITOT, PROT, ALBUMIN in the last 168 hours. No results for input(s): LIPASE, AMYLASE in the last 168 hours. No results for input(s): AMMONIA in the last 168 hours. Coagulation Profile: Recent Labs  Lab 05/20/21 0017  INR 1.2   Cardiac Enzymes: No results for input(s): CKTOTAL, CKMB, CKMBINDEX, TROPONINI in the last 168 hours. BNP (last 3 results) No results for input(s): PROBNP in the last 8760 hours. HbA1C: No results for input(s): HGBA1C in the last 72 hours. CBG: No results for input(s): GLUCAP in the last 168 hours. Lipid Profile: No results for input(s): CHOL, HDL, LDLCALC, TRIG, CHOLHDL, LDLDIRECT in the last 72 hours. Thyroid Function Tests: No results for input(s): TSH, T4TOTAL, FREET4, T3FREE, THYROIDAB in the last 72 hours. Anemia Panel: No results for input(s): VITAMINB12, FOLATE, FERRITIN, TIBC, IRON, RETICCTPCT in the last 72 hours. Urine analysis:    Component Value Date/Time   COLORURINE YELLOW 05/19/2021 1533   APPEARANCEUR HAZY (A) 05/19/2021 1533   APPEARANCEUR Hazy 07/27/2013 1742   LABSPEC 1.012 05/19/2021 1533   LABSPEC 1.026 07/27/2013 1742   PHURINE 6.0 05/19/2021 1533   GLUCOSEU NEGATIVE 05/19/2021 1533   GLUCOSEU Negative 07/27/2013 1742   HGBUR NEGATIVE 05/19/2021 1533   BILIRUBINUR NEGATIVE 05/19/2021 1533   BILIRUBINUR Negative 07/27/2013 1742   KETONESUR NEGATIVE 05/19/2021 1533   PROTEINUR NEGATIVE  05/19/2021 1533   UROBILINOGEN 1 12/17/2013 1118   NITRITE POSITIVE (A) 05/19/2021 1533   LEUKOCYTESUR LARGE (A) 05/19/2021 1533   LEUKOCYTESUR Negative 07/27/2013 1742    Radiological Exams on Admission: CT ABDOMEN PELVIS W CONTRAST  Result Date: 05/20/2021 CLINICAL DATA:  Abdominal pain and fever. EXAM: CT ABDOMEN AND PELVIS WITH CONTRAST TECHNIQUE: Multidetector CT imaging of the abdomen and pelvis was performed using the standard protocol following bolus administration of intravenous contrast. CONTRAST:  OMNIPAQUE IOHEXOL 300 MG/ML  SOLN COMPARISON:  None.  FINDINGS: Lower chest: The visualized lung bases are clear. No intra-abdominal free air.  Small free fluid in the pelvis. Hepatobiliary: No focal liver abnormality is seen. No gallstones, gallbladder wall thickening, or biliary dilatation. Pancreas: Unremarkable. No pancreatic ductal dilatation or surrounding inflammatory changes. Spleen: Normal in size without focal abnormality. Adrenals/Urinary Tract: The adrenal glands are unremarkable. There is heterogeneous enhancement of the right renal parenchyma which may be related to underlying parenchymal scarring. Correlation with urinalysis recommended to exclude pyelonephritis. No drainable fluid collection. There is no hydronephrosis on either side. The visualized ureters and urinary bladder appear unremarkable. Stomach/Bowel: Circumferential thickening and inflammatory changes of the rectosigmoid. There is no bowel obstruction. The appendix is normal. Vascular/Lymphatic: The abdominal aorta and IVC are unremarkable. No portal venous gas. Retroperitoneal adenopathy as well as several mildly enlarged perirectal lymph nodes. Reproductive: The prostate and seminal vesicles are grossly unremarkable. Other: None Musculoskeletal: No acute or significant osseous findings. IMPRESSION: 1. Proctocolitis. Underlying malignancy is not excluded. Further evaluation with sigmoidoscopy after resolution of acute  symptoms and inflammatory changes recommended. No drainable fluid collection or abscess. No bowel obstruction. Normal appendix. 2. Heterogeneous enhancement of the right renal parenchyma may be related to underlying parenchymal scarring. Correlation with urinalysis recommended to exclude pyelonephritis. No drainable fluid collection. 3. Retroperitoneal and perirectal adenopathy. Electronically Signed   By: Elgie Collard M.D.   On: 05/20/2021 02:59    EKG: No EKG done in the ED.  EKG ordered now and currently pending.  Assessment/Plan Principal Problem:   Proctocolitis Active Problems:   AIDS (acquired immune deficiency syndrome) (HCC)   HTN (hypertension)   SIRS secondary to proctocolitis and complicated UTI Febrile.  Slightly tachycardic initially but tachycardia has now resolved.  No hypotension, leukocytosis, or lactic acidosis to suggest sepsis.  CT abdomen pelvis showing findings consistent with proctocolitis; no drainable fluid collection or abscess.  Underlying malignancy not excluded.  Further evaluation with sigmoidoscopy recommended after resolution of acute symptoms and inflammatory changes.  Heterogenous enhancement of the right renal parenchyma; no drainable fluid collection.  Retroperitoneal and perirectal adenopathy. UA with positive nitrite, large amount of leukocytes, greater than 50 WBCs, and many bacteria.  Patient denies flank pain.  History of pyelonephritis secondary to ESBL E. coli in July 2022 and history of group B strep bacteremia in August 2022 -Continue meropenem.  Tylenol as needed for fevers.  Urine and blood cultures pending.  Monitor WBC count.  Consult ID in AM.  AIDS CD4 count <35 and viral load  1,160,000 on 04/29/2021.  History of medication noncompliance. -Continue Symtuza and Bactrim.  Consult ID in a.m.  Hypertension Stable.  Patient is no longer taking nifedipine. -Monitor blood pressure closely  DVT prophylaxis: Lovenox Code Status: Full code Family  Communication: No family available at this time. Disposition Plan: Status is: Inpatient  Remains inpatient appropriate because: Needs IV antibiotic for proctocolitis and complicated UTI  Level of care: Level of care: Telemetry Medical  The medical decision making on this patient was of high complexity and the patient is at high risk for clinical deterioration, therefore this is a level 3 visit.  John Giovanni MD [facility]  If 7PM-7AM, please contact night-coverage [email or web address]  05/20/2021, 5:40 AM

## 2021-05-20 NOTE — ED Provider Notes (Signed)
[facility] EMERGENCY DEPARTMENT Provider Note   CSN: [id number] Arrival date & time: 05/19/21  1304     History Chief Complaint  Patient presents with   Rectal Pain    Jacob Gutierrez is a 35 y.o. male.  35 yo M with a chief complaints of rectal discomfort.  This been going on for a couple days now.  Denies any drainage.  Subjectively hot and cold at home.  Was seen in the ED about a week ago with urinary symptoms.  He took all of his antibiotics but feels like the urinary symptoms have persisted.  Also has a rash to his left side.  Denies cough or congestion.  Has been able to eat and drink.  Has not been able to have a bowel movement due to the rectal discomfort.  The history is provided by the patient.  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 coli) (HCC) 03/04/2021   Lactic acidosis 03/04/2021   GERD (gastroesophageal reflux disease) 03/04/2021   ESBL  (extended spectrum beta-lactamase) producing bacteria infection    Obstructive uropathy    Prerenal renal failure    Nonadherence to medical treatment    Acute kidney injury (HCC) 02/10/2021   Loose stools    Bilious vomiting with nausea    SIRS (systemic inflammatory response syndrome) (HCC) 02/09/2021   Hypokalemia 10/15/2020   Nausea, vomiting, and diarrhea 10/15/2020   Colitis 10/15/2020   Dysphagia 10/15/2020   COVID-19 virus infection 08/25/2020   Dark urine 08/25/2020   Healthcare maintenance 07/23/2020   Pyelonephritis 02/13/2019   Prostatitis 10/05/2018   Leukopenia 09/20/2018   Hyponatremia 09/20/2018   Avoidance coping 09/07/2018   Fever 04/28/2018   Headache 04/27/2018   Condyloma 04/24/2018   Protein-calorie malnutrition, severe 12/12/2017   Personal history of MRSA (methicillin resistant Staphylococcus aureus) 12/09/2017   History of ESBL E. 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   Syphilis, unspecified 09/12/2016   Dysplasia of anus 01/02/2014   Tobacco use disorder 01/02/2014   Human immunodeficiency virus (HIV) disease (HCC) 01/01/2014    Past Surgical History:  Procedure Laterality Date   BIOPSY  10/16/2020   Procedure: BIOPSY;  Surgeon: Lemar Lofty., MD;  Location: [facility] ENDOSCOPY;  Service: Gastroenterology;;   BIOPSY  10/23/2020  Procedure: BIOPSY;  Surgeon: Lynann Bologna, MD;  Location: [facility] ENDOSCOPY;  Service: Endoscopy;;   COLONOSCOPY WITH PROPOFOL N/A 10/23/2020   Procedure: COLONOSCOPY WITH PROPOFOL;  Surgeon: Lynann Bologna, MD;  Location: [facility] ENDOSCOPY;  Service: Endoscopy;  Laterality: N/A;   ESOPHAGOGASTRODUODENOSCOPY (EGD) WITH PROPOFOL N/A 10/16/2020   Procedure: ESOPHAGOGASTRODUODENOSCOPY (EGD) WITH PROPOFOL;  Surgeon: Meridee Score Netty Starring., MD;  Location: [facility] ENDOSCOPY;  Service: Gastroenterology;  Laterality:  N/A;   TRANSURETHRAL RESECTION OF PROSTATE N/A 12/09/2017   Procedure: TRANSURETHRAL RESECTION OF THE PROSTATE (TURP);  Surgeon: Crist Fat, MD;  Location: [facility] ORS;  Service: Urology;  Laterality: N/A;       Family History  Problem Relation Age of Onset   Hypertension Mother    Lupus Maternal Grandmother    Diabetes Maternal Grandmother    Cancer Paternal Grandfather        unknown    Prostate cancer Father    Diabetes Father    Diabetes Maternal Grandfather    Colon cancer Neg Hx    Stomach cancer Neg Hx    Esophageal cancer Neg Hx    Pancreatic cancer Neg Hx     Social History   Tobacco Use   Smoking status: Every Day    Packs/day: 0.30    Types: Cigarettes   Smokeless tobacco: Never   Tobacco comments:    cutting back  Vaping Use   Vaping Use: Never used  Substance Use Topics   Alcohol use: Not Currently    Alcohol/week: 1.0 standard drink    Types: 1 Standard drinks or equivalent per week    Comment: whiskey occasionally    Drug use: Not Currently    Frequency: 1.0 times per week    Types: Marijuana    Home Medications Prior to Admission medications   Medication Sig Start Date End Date Taking? Authorizing Provider  Darunavir-Cobicistat-Emtricitabine-Tenofovir Alafenamide (SYMTUZA) 800-150-200-10 MG TABS Take 1 tablet by mouth daily with breakfast. 03/30/21   Veryl Speak, FNP  ferrous sulfate 325 (65 FE) MG tablet TAKE 1 TABLET (325 MG TOTAL) BY MOUTH DAILY AFTER BREAKFAST. Patient not taking: No sig reported 10/23/20 10/23/21  Maretta Bees, MD  fluconazole (DIFLUCAN) 200 MG tablet Take 1 tablet (200 mg total) by mouth daily. Patient not taking: No sig reported 02/15/21   Amin, Loura Halt, MD  lipase/protease/amylase (CREON) 36000 UNITS CPEP capsule TAKE 2 CAPSULES (72,000 UNITS) BY MOUTH WITH EVERY MEAL, TAKE 1 CAPSULE (36,000 UNITS) BY MOUTH WITH EVERY SNACK. Patient not taking: No sig reported 09/25/20 09/25/21  Unk Lightning, PA   loperamide (IMODIUM A-D) 2 MG tablet Take 1 tablet (2 mg total) by mouth every 8 (eight) hours. Patient not taking: No sig reported 09/18/20   Unk Lightning, PA  NIFEdipine (ADALAT CC) 60 MG 24 hr tablet Take 1 tablet (60 mg total) by mouth daily. Patient not taking: No sig reported 03/11/21   Leatha Gilding, MD  pantoprazole (PROTONIX) 40 MG tablet TAKE 1 TABLET (40 MG TOTAL) BY MOUTH 2 (TWO) TIMES DAILY. Patient not taking: No sig reported 10/23/20 10/23/21  Maretta Bees, MD  polyethylene glycol (MIRALAX) 17 g packet Take 17 g by mouth daily. 05/05/21   Cristopher Peru, PA-C  potassium chloride (KLOR-CON) 10 MEQ tablet TAKE 2 TABLETS BY MOUTH DAILY. Patient not taking: No sig reported 10/14/20 10/14/21  Anders Simmonds, PA-C  sulfamethoxazole-trimethoprim (BACTRIM DS) 800-160 MG tablet Take 1 tablet by mouth 3 (three) times a week. 03/31/21  Veryl Speak, FNP    Allergies    Vancomycin and Amoxicillin  Review of Systems   Review of Systems  Constitutional:  Positive for chills and fever.  HENT:  Negative for congestion and facial swelling.   Eyes:  Negative for discharge and visual disturbance.  Respiratory:  Negative for shortness of breath.   Cardiovascular:  Negative for chest pain and palpitations.  Gastrointestinal:  Positive for constipation and rectal pain. Negative for abdominal pain, diarrhea and vomiting.  Musculoskeletal:  Negative for arthralgias and myalgias.  Skin:  Negative for color change and rash.  Neurological:  Negative for tremors, syncope and headaches.  Psychiatric/Behavioral:  Negative for confusion and dysphoric mood.    Physical Exam Updated Vital Signs BP 124/77   Pulse 81   Temp (!) 100.4 F (38 C) (Oral)   Resp 16   SpO2 97%   Physical Exam Vitals and nursing note reviewed.  Constitutional:      Appearance: He is well-developed.  HENT:     Head: Normocephalic and atraumatic.  Eyes:     Pupils: Pupils are equal, round, and  reactive to light.  Neck:     Vascular: No JVD.  Cardiovascular:     Rate and Rhythm: Normal rate and regular rhythm.     Heart sounds: No murmur heard.   No friction rub. No gallop.  Pulmonary:     Effort: No respiratory distress.     Breath sounds: No wheezing.  Chest:       Comments: Hypopigmented lesions with central clearing Abdominal:     General: There is no distension.     Tenderness: There is no abdominal tenderness. There is no guarding or rebound.  Musculoskeletal:        General: Normal range of motion.     Cervical back: Normal range of motion and neck supple.  Skin:    Coloration: Skin is not pale.     Findings: No rash.  Neurological:     Mental Status: He is alert and oriented to person, place, and time.  Psychiatric:        Behavior: Behavior normal.    ED Results / Procedures / Treatments   Labs (all labs ordered are listed, but only abnormal results are displayed) Labs Reviewed  CBC WITH DIFFERENTIAL/PLATELET - Abnormal; Notable for the following components:      Result Value   RBC 3.32 (*)    Hemoglobin 10.7 (*)    HCT 31.5 (*)    RDW 15.6 (*)    All other components within normal limits  BASIC METABOLIC PANEL - Abnormal; Notable for the following components:   Sodium 130 (*)    Calcium 8.6 (*)    All other components within normal limits  URINALYSIS, ROUTINE W REFLEX MICROSCOPIC - Abnormal; Notable for the following components:   APPearance HAZY (*)    Nitrite POSITIVE (*)    Leukocytes,Ua LARGE (*)    WBC, UA >50 (*)    Bacteria, UA MANY (*)    All other components within normal limits  RESP PANEL BY RT-PCR (FLU A&B, COVID) ARPGX2  CULTURE, BLOOD (ROUTINE X 2)  CULTURE, BLOOD (ROUTINE X 2)  URINE CULTURE  LACTIC ACID, PLASMA  LACTIC ACID, PLASMA  PROTIME-INR    EKG None  Radiology CT ABDOMEN PELVIS W CONTRAST  Result Date: 05/20/2021 CLINICAL DATA:  Abdominal pain and fever. EXAM: CT ABDOMEN AND PELVIS WITH CONTRAST TECHNIQUE:  Multidetector CT imaging of the abdomen and pelvis  was performed using the standard protocol following bolus administration of intravenous contrast. CONTRAST:  OMNIPAQUE IOHEXOL 300 MG/ML  SOLN COMPARISON:  None. FINDINGS: Lower chest: The visualized lung bases are clear. No intra-abdominal free air.  Small free fluid in the pelvis. Hepatobiliary: No focal liver abnormality is seen. No gallstones, gallbladder wall thickening, or biliary dilatation. Pancreas: Unremarkable. No pancreatic ductal dilatation or surrounding inflammatory changes. Spleen: Normal in size without focal abnormality. Adrenals/Urinary Tract: The adrenal glands are unremarkable. There is heterogeneous enhancement of the right renal parenchyma which may be related to underlying parenchymal scarring. Correlation with urinalysis recommended to exclude pyelonephritis. No drainable fluid collection. There is no hydronephrosis on either side. The visualized ureters and urinary bladder appear unremarkable. Stomach/Bowel: Circumferential thickening and inflammatory changes of the rectosigmoid. There is no bowel obstruction. The appendix is normal. Vascular/Lymphatic: The abdominal aorta and IVC are unremarkable. No portal venous gas. Retroperitoneal adenopathy as well as several mildly enlarged perirectal lymph nodes. Reproductive: The prostate and seminal vesicles are grossly unremarkable. Other: None Musculoskeletal: No acute or significant osseous findings. IMPRESSION: 1. Proctocolitis. Underlying malignancy is not excluded. Further evaluation with sigmoidoscopy after resolution of acute symptoms and inflammatory changes recommended. No drainable fluid collection or abscess. No bowel obstruction. Normal appendix. 2. Heterogeneous enhancement of the right renal parenchyma may be related to underlying parenchymal scarring. Correlation with urinalysis recommended to exclude pyelonephritis. No drainable fluid collection. 3. Retroperitoneal and  perirectal adenopathy. Electronically Signed   By: Elgie Collard M.D.   On: 05/20/2021 02:59    Procedures Procedures   Medications Ordered in ED Medications  acetaminophen (TYLENOL) tablet 650 mg (650 mg Oral Given 05/20/21 0011)  meropenem (MERREM) 1 g in sodium chloride 0.9 % 100 mL IVPB (1 g Intravenous New Bag/Given 05/20/21 0215)  iohexol (OMNIPAQUE) 300 MG/ML solution 100 mL (100 mLs Intravenous Contrast Given 05/20/21 0249)    ED Course  I have reviewed the triage vital signs and the nursing notes.  Pertinent labs & imaging results that were available during my care of the patient were reviewed by me and considered in my medical decision making (see chart for details).    MDM Rules/Calculators/A&P                           35 yo M with a chief complaint of rectal discomfort.  This been going on for a couple days now.  Patient found to be febrile here in the ED.  UA consistent with urinary tract infection this is despite recently being treated with antibiotics.  Patient has a history of ESBL urosepsis.  Will start on meropenem.  CT abdomen pelvis to evaluate for deep space abscess.  Reassess. .  CT scan consistent with proctocolitis.  No drainable fluid collection was noted.  Also concerning for possible continued pyelonephritis.  Will discuss with medicine for admission.  The patients results and plan were reviewed and discussed.   Any x-rays performed were independently reviewed by myself.   Differential diagnosis were considered with the presenting HPI.  Medications  acetaminophen (TYLENOL) tablet 650 mg (650 mg Oral Given 05/20/21 0011)  meropenem (MERREM) 1 g in sodium chloride 0.9 % 100 mL IVPB (1 g Intravenous New Bag/Given 05/20/21 0215)  iohexol (OMNIPAQUE) 300 MG/ML solution 100 mL (100 mLs Intravenous Contrast Given 05/20/21 0249)    Vitals:   05/19/21 1836 05/19/21 2153 05/20/21 0145 05/20/21 0200  BP: (!) 146/88 121/73 112/80 124/77  Pulse: Marland Kitchen)  107 (!)  107 94 81  Resp: 15 18 16 16   Temp:  (!) 100.4 F (38 C)    TempSrc:  Oral    SpO2: 98% 100% 100% 97%    Final diagnoses:  Pyelonephritis  Colitis    Admission/ observation were discussed with the admitting physician, patient and/or family and they are comfortable with the plan.    Final Clinical Impression(s) / ED Diagnoses Final diagnoses:  Pyelonephritis  Colitis    Rx / DC Orders ED Discharge Orders     None        , DO 05/20/21 [phone]

## 2021-05-20 NOTE — Progress Notes (Signed)
Pharmacy Antibiotic Note  Jacob Gutierrez is a 35 y.o. male admitted on 05/19/2021 with  proctocolitis - h/o ESBL Ecoli pyelonephritis .  Pharmacy has been consulted for Meropenem dosing.  Plan: Meropenem 1gm IV q8h Will f/u renal function, micro data, and pt's clinical condition    Temp (24hrs), Avg:99.3 F (37.4 C), Min:98 F (36.7 C), Max:100.4 F (38 C)  Recent Labs  Lab 05/19/21 1533 05/20/21 0017 05/20/21 0206  WBC 6.9  --   --   CREATININE 0.94  --   --   LATICACIDVEN  --  1.0 0.7    CrCl cannot be calculated (Unknown ideal weight.).    Allergies  Allergen Reactions   Vancomycin Anaphylaxis, Itching, Swelling and Other (See Comments)    Angioedema and "everything swells"   Amoxicillin Other (See Comments)    From childhood: "I had a reaction when i was little." (??) Has tolerated multiple cephalosporins in the past    Antimicrobials this admission: 10/27 Meropenem >>   Microbiology results: 10/27 BCx:  10/27 UCx:    Thank you for allowing pharmacy to be a part of this patient's care.  Christoper Fabian, PharmD, BCPS Please see amion for complete clinical pharmacist phone list 05/20/2021 5:40 AM

## 2021-05-21 DIAGNOSIS — B04 Monkeypox: Secondary | ICD-10-CM

## 2021-05-21 DIAGNOSIS — R21 Rash and other nonspecific skin eruption: Secondary | ICD-10-CM | POA: Diagnosis not present

## 2021-05-21 DIAGNOSIS — K529 Noninfective gastroenteritis and colitis, unspecified: Secondary | ICD-10-CM

## 2021-05-21 DIAGNOSIS — B20 Human immunodeficiency virus [HIV] disease: Secondary | ICD-10-CM

## 2021-05-21 DIAGNOSIS — N12 Tubulo-interstitial nephritis, not specified as acute or chronic: Secondary | ICD-10-CM | POA: Diagnosis not present

## 2021-05-21 LAB — BASIC METABOLIC PANEL
Anion gap: 6 (ref 5–15)
BUN: 13 mg/dL (ref 6–20)
CO2: 24 mmol/L (ref 22–32)
Calcium: 8.1 mg/dL — ABNORMAL LOW (ref 8.9–10.3)
Chloride: 99 mmol/L (ref 98–111)
Creatinine, Ser: 1.19 mg/dL (ref 0.61–1.24)
GFR, Estimated: >60 mL/min (ref >60)
Glucose, Bld: 77 mg/dL (ref 70–99)
Potassium: 3.6 mmol/L (ref 3.5–5.1)
Sodium: 129 mmol/L — ABNORMAL LOW (ref 135–145)

## 2021-05-21 LAB — URINE CULTURE: Culture: >=100000 — AB

## 2021-05-21 LAB — CBC WITH DIFFERENTIAL/PLATELET
Abs Immature Granulocytes: 0.66 10*3/uL — ABNORMAL HIGH (ref 0.00–0.07)
Basophils Absolute: 0 10*3/uL (ref 0.0–0.1)
Basophils Relative: 0 %
Eosinophils Absolute: 0.3 10*3/uL (ref 0.0–0.5)
Eosinophils Relative: 5 %
HCT: 32.9 % — ABNORMAL LOW (ref 39.0–52.0)
Hemoglobin: 11 g/dL — ABNORMAL LOW (ref 13.0–17.0)
Immature Granulocytes: 12 %
Lymphocytes Relative: 16 %
Lymphs Abs: 0.9 10*3/uL (ref 0.7–4.0)
MCH: 32.3 pg (ref 26.0–34.0)
MCHC: 33.4 g/dL (ref 30.0–36.0)
MCV: 96.5 fL (ref 80.0–100.0)
Monocytes Absolute: 0.5 10*3/uL (ref 0.1–1.0)
Monocytes Relative: 8 %
Neutro Abs: 3.4 10*3/uL (ref 1.7–7.7)
Neutrophils Relative %: 59 %
Platelets: 119 10*3/uL — ABNORMAL LOW (ref 150–400)
RBC: 3.41 MIL/uL — ABNORMAL LOW (ref 4.22–5.81)
RDW: 15.8 % — ABNORMAL HIGH (ref 11.5–15.5)
WBC: 5.7 10*3/uL (ref 4.0–10.5)
nRBC: 0 % (ref 0.0–0.2)

## 2021-05-21 MED ORDER — TECOVIRIMAT 200 MG PO CAPS
600.0000 mg | ORAL_CAPSULE | Freq: Two times a day (BID) | ORAL | Status: DC
  Filled 2021-05-21 (×3): qty 3

## 2021-05-21 MED ORDER — TECOVIRIMAT 200 MG PO CAPS
600.0000 mg | ORAL_CAPSULE | Freq: Two times a day (BID) | ORAL | Status: DC
  Administered 2021-05-21 – 2021-05-25 (×9): 600 mg via ORAL
  Filled 2021-05-21 (×10): qty 3

## 2021-05-21 MED ORDER — BOOST / RESOURCE BREEZE PO LIQD CUSTOM
1.0000 | Freq: Two times a day (BID) | ORAL | Status: DC
  Administered 2021-05-21 – 2021-05-25 (×7): 1 via ORAL
  Filled 2021-05-21 (×10): qty 1

## 2021-05-21 MED ORDER — BOOST / RESOURCE BREEZE PO LIQD CUSTOM: 1.0000 | Freq: Two times a day (BID) | ORAL | Status: DC

## 2021-05-21 MED ORDER — TECOVIRIMAT 200 MG PO CAPS
600.0000 mg | ORAL_CAPSULE | Freq: Two times a day (BID) | ORAL | Status: DC
  Filled 2021-05-21: qty 3

## 2021-05-21 NOTE — Progress Notes (Signed)
   05/21/21 0446  Assess: MEWS Score  Temp (!) 100.4 F (38 C)  BP 138/86  Pulse Rate (!) 120  Resp 19  SpO2 98 %  O2 Device Room Air  Assess: MEWS Score  MEWS Temp 0  MEWS Systolic 0  MEWS Pulse 2  MEWS RR 0  MEWS LOC 0  MEWS Score 2  MEWS Score Color Yellow  Assess: if the MEWS score is Yellow or Red  Were vital signs taken at a resting state? No  Focused Assessment No change from prior assessment  Early Detection of Sepsis Score *See Row Information* Medium  MEWS guidelines implemented *See Row Information* No, previously yellow, continue vital signs every 4 hours      05/21/21 0518  Notify: Provider  Provider Name/Title J. Garner Nash  Date Provider Notified 05/21/21  Time Provider Notified 0518  Notification Type Page  Notification Reason Change in status  Provider response Other (Comment) (cool room, remove blankets, and limit ambulation)  Date of Provider Response 05/21/21  Time of Provider Response [phone]

## 2021-05-21 NOTE — Consult Note (Signed)
[facility]    Date of Admission:  05/19/2021     Reason for Consult:  Rash     Referring Physician: Dr Rito Ehrlich  Current antibiotics: Meropenem 10/26-present Metronidazole 10/27-present  Symtuza  TMP SMX PPx  ASSESSMENT & RECOMMENDATIONS:    35 y.o. male admitted with:  # Suspected monkeypox: His rash is highly suspicious for monkeypox and complicated by oral and anogenital lesions with advanced HIV disease.  Will also have to consider other potential etiologies.  -- Start TPoxx -- Check Monkeypox PCR from lesions -- Check rectal, oral, and urinary GC/CT -- Check RPR -- May need stool softeners, topical lidocaine to help with pain -- Airborne and contact precautions per IP  # Advanced HIV disease: Has been non-adherent in the past and recent viral load greater than 1 million with CD4 count < 35.  -- Continue Symtuza -- Continue Bactrim for OI prophylaxis -- Viral load and CD4 count  # Possible UTI/pyelonephritis: Unclear if currently has true UTI/pyelo given no significant urinary complaints.  Suspect current CT findings more c/w scarring than active pyelo and presentation more c/w suspected monkeypox in terms of proctocolitis.  -- Stop metronidazole -- Continue meropenem for now (Day # 3) to treat for UTI.  Would treat for 5 days then stop on 10/30.   Dr Daiva Eves is here this weekend.      Principal Problem:   Proctocolitis Active Problems:   AIDS (acquired immune deficiency syndrome) (HCC)   HTN (hypertension)   MEDICATIONS:    Scheduled Meds:  Darunavir-Cobicistat-Emtricitabine-Tenofovir Alafenamide  1 tablet Oral Q breakfast   enoxaparin (LOVENOX) injection  40 mg Subcutaneous Q24H   pantoprazole  40 mg Oral BID   polyethylene glycol  17 g Oral Daily   sulfamethoxazole-trimethoprim  1 tablet Oral Q T,Th,Sat-1800   Continuous Infusions:  meropenem (MERREM) IV 1 g (05/21/21 0610)   metronidazole 100 mL/hr at 05/21/21  0544   PRN Meds:.acetaminophen **OR** acetaminophen, morphine injection, oxyCODONE  HPI:    Jacob Gutierrez is a 35 y.o. male with uncontrolled HIV disease (known K103N and M184V mutation) currently on Symtuza and TMP SMX for PCP prophylaxis, previous gonorrhea, pulmonary cocciodomycosis, syphilis (RPR Titer 1:1 to 1:2 for several years, poor adherence, ESBL E coli pyelonephritis (July 2022), group A strep bacteremia (August 2022) who is admitted 05/19/21 with rectal pain and rash.    He was recently seen in the emergency department on 05/05/2021 with abdominal pain, constipation, and dysuria.  Work-up at that time showed urinalysis with positive nitrites, pyuria, and many bacteria.  Urine culture showed greater than 100,000 colonies of ESBL E. coli.  He had a CT scan showing urinary bladder wall thickening possibly cystitis.  He also had a right upper quadrant ultrasound that showed gallbladder sludge but no signs of cholecystitis.  Lab work at that time was notable for WBC 3.4, hemoglobin 11.8, platelets 195, AST 28, ALT 20.  He was discharged home from the emergency department at that time with a prescription for for Keflex and bowel regimen.  He now presents this admission with rectal pain as well as rash.  He has also been febrile.  Urinalysis obtained at admission continues to show positive nitrites, pyuria, and many bacteria.  He had repeat imaging of the abdomen/pelvis with CT yesterday that showed proctocolitis and no drainable fluid collection.  There was enhancement of the right renal parenchyma that may be related to underlying scarring.  There was an unremarkable  appearance of the bladder and ureters.  He does not report dysuria or discharge.  He also reports rash that has developed over the past week that is disseminated on his trunk, feet, mouth, and rectum.  The rectal lesions are painful as are the oral lesions.  The lesions on his abdomen/flank area are pruritic but not particularly  painful.  He reports unprotected anal intercourse within the past few weeks.   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 [facility])    H/O coccidioidomycosis    pulmonary    HIV disease (HCC)    Past history of allergy to penicillin-type antibiotic 07-03-2012   desensitization   PNA (pneumonia)    Shigella gastroenteritis    Syphilis    history /treated     Social History   Tobacco Use   Smoking status: Every Day    Packs/day: 0.30    Types: Cigarettes   Smokeless tobacco: Never   Tobacco comments:    cutting back  Vaping Use   Vaping Use: Never used  Substance Use Topics   Alcohol use: Not Currently    Alcohol/week: 1.0 standard drink    Types: 1 Standard drinks or equivalent per week    Comment: whiskey occasionally    Drug use: Not Currently    Frequency: 1.0 times per week    Types: Marijuana    Family History  Problem Relation Age of Onset   Hypertension Mother    Lupus Maternal Grandmother    Diabetes Maternal Grandmother    Cancer Paternal Grandfather        unknown    Prostate cancer Father    Diabetes Father    Diabetes Maternal Grandfather    Colon cancer Neg Hx    Stomach cancer Neg Hx    Esophageal cancer Neg Hx    Pancreatic cancer Neg Hx     Allergies  Allergen Reactions   Vancomycin Anaphylaxis, Itching, Swelling and Other (See Comments)    Angioedema and "everything swells"   Amoxicillin Other (See Comments)    From childhood: "I had a reaction when i was little." (??) Has tolerated multiple cephalosporins in the past    Review of Systems  All other systems reviewed and are negative.  Except as noted in HPI.   OBJECTIVE:   Blood pressure 120/90, pulse 99, temperature 98.3 F (36.8 C), temperature source Oral, resp. rate 20, height 6\' 1"  (1.854 m), weight 81.6 kg, SpO2 99  %. Body mass index is 23.75 kg/m.  Physical Exam Constitutional:      General: He is not in acute distress.    Comments: Thin appearing man, no distress, temporal wasting  HENT:     Head: Normocephalic and atraumatic.     Mouth/Throat:     Comments: Dentition is poor. See picture of oral lesions.  Eyes:     Extraocular Movements: Extraocular movements intact.     Conjunctiva/sclera: Conjunctivae normal.  Cardiovascular:     Rate and Rhythm: Normal rate and regular rhythm.     Heart sounds: No murmur heard. Pulmonary:     Effort: Pulmonary effort is normal. No respiratory distress.     Breath sounds: Normal breath sounds.  Abdominal:     General: There is no distension.     Palpations: Abdomen is soft.     Tenderness: There  is no abdominal tenderness.  Musculoskeletal:        General: Normal range of motion.     Cervical back: Normal range of motion and neck supple.     Right lower leg: No edema.     Left lower leg: No edema.  Skin:    General: Skin is warm and dry.     Findings: Lesion and rash present.     Comments: See pictures below for rectal and flank lesions.   Neurological:     General: No focal deficit present.     Mental Status: He is alert and oriented to person, place, and time.  Psychiatric:        Mood and Affect: Mood normal.        Behavior: Behavior normal.            Lab Results: Lab Results  Component Value Date   WBC 5.7 05/21/2021   HGB 11.0 (L) 05/21/2021   HCT 32.9 (L) 05/21/2021   MCV 96.5 05/21/2021   PLT 119 (L) 05/21/2021    Lab Results  Component Value Date   NA 129 (L) 05/21/2021   K 3.6 05/21/2021   CO2 24 05/21/2021   GLUCOSE 77 05/21/2021   BUN 13 05/21/2021   CREATININE 1.19 05/21/2021   CALCIUM 8.1 (L) 05/21/2021   GFRNONAA >60 05/21/2021   GFRAA 105 08/25/2020    Lab Results  Component Value Date   ALT 20 05/05/2021   AST 28 05/05/2021   ALKPHOS 108 05/05/2021   BILITOT 0.6 05/05/2021       Component  Value Date/Time   CRP 31.6 (H) 03/04/2021 0553    No results found for: ESRSEDRATE  I have reviewed the micro and lab results in Epic.  Imaging: CT ABDOMEN PELVIS W CONTRAST  Result Date: 05/20/2021 CLINICAL DATA:  Abdominal pain and fever. EXAM: CT ABDOMEN AND PELVIS WITH CONTRAST TECHNIQUE: Multidetector CT imaging of the abdomen and pelvis was performed using the standard protocol following bolus administration of intravenous contrast. CONTRAST:  OMNIPAQUE IOHEXOL 300 MG/ML  SOLN COMPARISON:  None. FINDINGS: Lower chest: The visualized lung bases are clear. No intra-abdominal free air.  Small free fluid in the pelvis. Hepatobiliary: No focal liver abnormality is seen. No gallstones, gallbladder wall thickening, or biliary dilatation. Pancreas: Unremarkable. No pancreatic ductal dilatation or surrounding inflammatory changes. Spleen: Normal in size without focal abnormality. Adrenals/Urinary Tract: The adrenal glands are unremarkable. There is heterogeneous enhancement of the right renal parenchyma which may be related to underlying parenchymal scarring. Correlation with urinalysis recommended to exclude pyelonephritis. No drainable fluid collection. There is no hydronephrosis on either side. The visualized ureters and urinary bladder appear unremarkable. Stomach/Bowel: Circumferential thickening and inflammatory changes of the rectosigmoid. There is no bowel obstruction. The appendix is normal. Vascular/Lymphatic: The abdominal aorta and IVC are unremarkable. No portal venous gas. Retroperitoneal adenopathy as well as several mildly enlarged perirectal lymph nodes. Reproductive: The prostate and seminal vesicles are grossly unremarkable. Other: None Musculoskeletal: No acute or significant osseous findings. IMPRESSION: 1. Proctocolitis. Underlying malignancy is not excluded. Further evaluation with sigmoidoscopy after resolution of acute symptoms and inflammatory changes recommended. No  drainable fluid collection or abscess. No bowel obstruction. Normal appendix. 2. Heterogeneous enhancement of the right renal parenchyma may be related to underlying parenchymal scarring. Correlation with urinalysis recommended to exclude pyelonephritis. No drainable fluid collection. 3. Retroperitoneal and perirectal adenopathy. Electronically Signed   By: Elgie Collard M.D.   On: 05/20/2021 02:59  Imaging independently reviewed in Epic.  Vedia Coffer for Infectious Disease [facility] [phone] pager 05/21/2021, 12:08 PM

## 2021-05-21 NOTE — Progress Notes (Signed)
   05/20/21 2020  Assess: MEWS Score  Temp (!) 101.3 F (38.5 C)  BP 139/82  Pulse Rate (!) 107  Resp 18  SpO2 98 %  O2 Device Room Air  Assess: MEWS Score  MEWS Temp 1  MEWS Systolic 0  MEWS Pulse 1  MEWS RR 0  MEWS LOC 0  MEWS Score 2  MEWS Score Color Yellow  Assess: if the MEWS score is Yellow or Red  Were vital signs taken at a resting state? Yes  Focused Assessment No change from prior assessment  Early Detection of Sepsis Score *See Row Information* Low  MEWS guidelines implemented *See Row Information* Yes  Take Vital Signs  Increase Vital Sign Frequency  Yellow: Q 2hr X 2 then Q 4hr X 2, if remains yellow, continue Q 4hrs  Escalate  MEWS: Escalate Yellow: discuss with charge nurse/RN and consider discussing with provider and RRT  Notify: Charge Nurse/RN  Name of Charge Nurse/RN Notified [place], RN  Date Charge Nurse/RN Notified 05/20/21  Time Charge Nurse/RN Notified 2030  Document  Patient Outcome Stabilized after interventions

## 2021-05-21 NOTE — Progress Notes (Signed)
Patient heart rate ranged from 90-110 when resting in bed or walking in room. When patient walked to bathroom and strained at all heart rate maintained 140's. He would be back to the 90-110 by the time he finished ambulating back to bed.

## 2021-05-21 NOTE — Plan of Care (Signed)

## 2021-05-21 NOTE — Progress Notes (Signed)
TPoxx ordered per Dr Drue Second. Investigational drug services will dispense.   Ulyses Southward, PharmD, BCIDP, AAHIVP, CPP Infectious Disease Pharmacist 05/21/2021 12:11 PM

## 2021-05-21 NOTE — Progress Notes (Signed)
[facility] PROGRESS NOTE   Jacob Gutierrez ZGY:[id number] DOB: [birth year] DOA: 05/19/2021  PCP: Patient, No Pcp Per (Inactive)  Brief History/Interval Summary:  35 y.o. male with medical history significant of HIV/AIDS (CD4 count <35 on 04/29/2021), medication noncompliance, hypertension, substance abuse, history of gonorrhea, syphilis, pulmonary coccidioidomycosis.  Admitted in July 2022 for sepsis secondary to pyelonephritis secondary to ESBL E. coli.  Admitted again in August 2022 for sepsis secondary to group B strep bacteremia.  He presents to the ED complaining of rectal pain.  Febrile with temperature 100.4 F, slightly tachycardic.  Labs showing WBC 6.9, hemoglobin 10.7 (no significant change from baseline), platelet count 160k.  Sodium 130 (chronically low and at baseline), potassium 3.5, chloride 100, bicarb 22, BUN 12, creatinine 0.9, glucose 87.  UA with positive nitrite, large amount of leukocytes, greater than 50 WBCs, and many bacteria.  Urine and blood cultures pending.  Lactic acid normal x2.  INR 1.2.  COVID and influenza PCR negative.  CT abdomen pelvis showing findings consistent with proctocolitis; no drainable fluid collection or abscess.  Patient was hospitalized for further management.   Reason for Visit: ESBL E. coli pyelonephritis.  Diffuse papular rash.  Proctocolitis  Consultants: Infectious disease  Procedures: None    Subjective/Interval History: Patient mentions that he has a diffuse rash all over his body including his anal area.  Some itching is present.  No pain.  Denies any dysuria or flank pain.  No nausea or vomiting.  No shortness of breath.     Assessment/Plan:  Proctocolitis/acute pyelonephritis/ESBL E. coli infection/sepsis present on admission Patient was noted to be febrile tachycardic at the time of admission. CT scan showed proctocolitis without any abscess.  Recent urine culture with ESBL E. coli.  CT scan also raised concern for  pyelonephritis.  Based on previous sensitivities patient has been placed on meropenem.  Metronidazole was also added due to concern for GI/abdominal infection.  He continues to have fever. Follow culture results.  Diffuse rash in the setting of HIV/AIDS Patient also with history of gonorrhea and syphilis in the past.  His CD4 count has been less than 35 when recently checked.  He is on antiretroviral treatment and on Bactrim.   Reason for this rash is not entirely clear with a very broad differential diagnosis.  We will request infectious disease to evaluate this patient.  Essential hypertension Currently off all 4 antihypertensives.  Blood pressure seems to be reasonably well controlled.  Iron deficiency anemia Hemoglobin is stable.  No evidence of overt blood loss.  Apparently not taking his oral iron supplementation.  Hyponatremia This is chronic.  Continue to monitor.  Etiology is unclear but likely multifactorial.  Stage II decubitus in the coccyx area Pressure Injury 03/04/21 Coccyx Mid Stage 2 -  Partial thickness loss of dermis presenting as a shallow open injury with a red, pink wound bed without slough. (Active)  03/04/21 2000  Location: Coccyx  Location Orientation: Mid  Staging: Stage 2 -  Partial thickness loss of dermis presenting as a shallow open injury with a red, pink wound bed without slough.  Wound Description (Comments):   Present on Admission: Yes     DVT Prophylaxis: Lovenox Code Status: Full code Family Communication: Discussed with patient.  No family at bedside Disposition Plan: Hopefully return home when improved  Status is: Inpatient  Remains inpatient appropriate because: Need for IV antibiotic     Medications: Scheduled:  Darunavir-Cobicistat-Emtricitabine-Tenofovir Alafenamide  1 tablet Oral Q breakfast  enoxaparin (LOVENOX) injection  40 mg Subcutaneous Q24H   pantoprazole  40 mg Oral BID   polyethylene glycol  17 g Oral Daily    sulfamethoxazole-trimethoprim  1 tablet Oral Q T,Th,Sat-1800   Continuous:  meropenem (MERREM) IV 1 g (05/21/21 0610)   metronidazole 100 mL/hr at 05/21/21 0544   ZGY:[id number] **OR** acetaminophen, morphine injection, oxyCODONE  Antibiotics: Anti-infectives (From admission, onward)    Start     Dose/Rate Route Frequency Ordered Stop   05/20/21 1800  sulfamethoxazole-trimethoprim (BACTRIM DS) 800-160 MG per tablet 1 tablet       Note to Pharmacy: Patient taking differently: Tuesday,Thursday,saturday     1 tablet Oral Every T-Th-Sa (1800) 05/20/21 0537     05/20/21 1800  metroNIDAZOLE (FLAGYL) IVPB 500 mg        500 mg 100 mL/hr over 60 Minutes Intravenous Every 12 hours 05/20/21 1707     05/20/21 1200  meropenem (MERREM) 1 g in sodium chloride 0.9 % 100 mL IVPB        1 g 200 mL/hr over 30 Minutes Intravenous Every 8 hours 05/20/21 0544     05/20/21 0800  Darunavir-Cobicistat-Emtricitabine-Tenofovir Alafenamide (SYMTUZA) 800-150-200-10 MG TABS 1 tablet        1 tablet Oral Daily with breakfast 05/20/21 0537     05/20/21 0130  meropenem (MERREM) 1 g in sodium chloride 0.9 % 100 mL IVPB        1 g 200 mL/hr over 30 Minutes Intravenous  Once 05/20/21 0126 05/20/21 0326       Objective:  Vital Signs  Vitals:   05/21/21 0446 05/21/21 0611 05/21/21 0654 05/21/21 0806  BP: 138/86 113/73  120/90  Pulse: (!) 120 (!) 106 96 99  Resp: 19 18  20   Temp: (!) 100.4 F (38 C) 98.7 F (37.1 C)  98.3 F (36.8 C)  TempSrc: Oral   Oral  SpO2: 98% 97%  99%  Weight:      Height:        Intake/Output Summary (Last 24 hours) at 05/21/2021 0946 Last data filed at 05/21/2021 0832 Gross per 24 hour  Intake 1666.66 ml  Output --  Net 1666.66 ml   Filed Weights   05/20/21 1802  Weight: 81.6 kg    General appearance: Awake alert.  In no distress Resp: Clear to auscultation bilaterally.  Normal effort Cardio: S1-S2 is normal regular.  No S3-S4.  No rubs murmurs or bruit GI:  Abdomen is soft.  Nontender nondistended.  Bowel sounds are present normal.  No masses organomegaly Rectal: Inspection of the perianal area reveals raw appearing skin.  Papular lesions noted in the perianal area.  Tender to palpate. Extremities: No edema.  Full range of motion of lower extremities. Skin: He has papular lesions all over his body.  No vesicular lesions identified.  There are also lesions in his oral cavity. Neurologic: Alert and oriented x3.  No focal neurological deficits.    Lab Results:  Data Reviewed: I have personally reviewed following labs and imaging studies  CBC: Recent Labs  Lab 05/19/21 1533 05/20/21 0920 05/21/21 0207  WBC 6.9 6.0 5.7  NEUTROABS 4.1  --  3.4  HGB 10.7* 11.1* 11.0*  HCT 31.5* 33.7* 32.9*  MCV 94.9 96.3 96.5  PLT 160 146* 119*    Basic Metabolic Panel: Recent Labs  Lab 05/19/21 1533 05/21/21 0207  NA 130* 129*  K 3.5 3.6  CL 100 99  CO2 22 24  GLUCOSE 87 77  BUN 12 13  CREATININE 0.94 1.19  CALCIUM 8.6* 8.1*    GFR: Estimated Creatinine Clearance: 96.1 mL/min (by C-G formula based on SCr of 1.19 mg/dL).    Coagulation Profile: Recent Labs  Lab 05/20/21 0017  INR 1.2     Recent Results (from the past 240 hour(s))  Resp Panel by RT-PCR (Flu A&B, Covid) Nasopharyngeal Swab     Status: None   Collection Time: 05/19/21 10:53 PM   Specimen: Nasopharyngeal Swab; Nasopharyngeal(NP) swabs in vial transport medium  Result Value Ref Range Status   SARS Coronavirus 2 by RT PCR NEGATIVE NEGATIVE Final    Comment: (NOTE) SARS-CoV-2 target nucleic acids are NOT DETECTED.  The SARS-CoV-2 RNA is generally detectable in upper respiratory specimens during the acute phase of infection. The lowest concentration of SARS-CoV-2 viral copies this assay can detect is 138 copies/mL. A negative result does not preclude SARS-Cov-2 infection and should not be used as the sole basis for treatment or other patient management decisions. A  negative result may occur with  improper specimen collection/handling, submission of specimen other than nasopharyngeal swab, presence of viral mutation(s) within the areas targeted by this assay, and inadequate number of viral copies(<138 copies/mL). A negative result must be combined with clinical observations, patient history, and epidemiological information. The expected result is Negative.  Fact Sheet for Patients:  BloggerCourse.com  Fact Sheet for Healthcare Providers:  SeriousBroker.it  This test is no t yet approved or cleared by the [place] FDA and  has been authorized for detection and/or diagnosis of SARS-CoV-2 by FDA under an Emergency Use Authorization (EUA). This EUA will remain  in effect (meaning this test can be used) for the duration of the COVID-19 declaration under Section 564(b)(1) of the Act, 21 U.S.C.section 360bbb-3(b)(1), unless the authorization is terminated  or revoked sooner.       Influenza A by PCR NEGATIVE NEGATIVE Final   Influenza B by PCR NEGATIVE NEGATIVE Final    Comment: (NOTE) The Xpert Xpress SARS-CoV-2/FLU/RSV plus assay is intended as an aid in the diagnosis of influenza from Nasopharyngeal swab specimens and should not be used as a sole basis for treatment. Nasal washings and aspirates are unacceptable for Xpert Xpress SARS-CoV-2/FLU/RSV testing.  Fact Sheet for Patients: BloggerCourse.com  Fact Sheet for Healthcare Providers: SeriousBroker.it  This test is not yet approved or cleared by the [place] FDA and has been authorized for detection and/or diagnosis of SARS-CoV-2 by FDA under an Emergency Use Authorization (EUA). This EUA will remain in effect (meaning this test can be used) for the duration of the COVID-19 declaration under Section 564(b)(1) of the Act, 21 U.S.C. section 360bbb-3(b)(1), unless the authorization  is terminated or revoked.  Performed at [facility], [address], [place], [place] [address]   Blood culture (routine x 2)     Status: None (Preliminary result)   Collection Time: 05/20/21 12:00 AM   Specimen: BLOOD LEFT ARM  Result Value Ref Range Status   Specimen Description BLOOD LEFT ARM  Final   Special Requests   Final    BOTTLES DRAWN AEROBIC AND ANAEROBIC Blood Culture adequate volume   Culture   Final    NO GROWTH < 12 HOURS Performed at [facility], [address] [place], [place] [address]    Report Status PENDING  Incomplete  Blood culture (routine x 2)     Status: None (Preliminary result)   Collection Time: 05/20/21 12:13 AM   Specimen:  BLOOD RIGHT ARM  Result Value Ref Range Status   Specimen Description BLOOD RIGHT ARM  Final   Special Requests   Final    BOTTLES DRAWN AEROBIC ONLY Blood Culture adequate volume   Culture   Final    NO GROWTH < 12 HOURS Performed at [facility], [address], [place], [facility] Report Status PENDING  Incomplete  Urine Culture     Status: Abnormal   Collection Time: 05/20/21  2:16 AM   Specimen: Urine, Clean Catch  Result Value Ref Range Status   Specimen Description URINE, CLEAN CATCH  Final   Special Requests   Final    NONE Performed at [facility], [address], [place], [place] [address]    Culture (A)  Final    >=100,000 COLONIES/mL ESCHERICHIA COLI Confirmed Extended Spectrum Beta-Lactamase Producer (ESBL).  In bloodstream infections from ESBL organisms, carbapenems are preferred over piperacillin/tazobactam. They are shown to have a lower risk of mortality.    Report Status 05/21/2021 FINAL  Final   Organism ID, Bacteria ESCHERICHIA COLI (A)  Final      Susceptibility   Escherichia coli - MIC*    AMPICILLIN >=32 RESISTANT Resistant     CEFAZOLIN >=64 RESISTANT Resistant     CEFEPIME >=32 RESISTANT Resistant     CEFTRIAXONE >=64 RESISTANT Resistant      CIPROFLOXACIN >=4 RESISTANT Resistant     GENTAMICIN >=16 RESISTANT Resistant     IMIPENEM <=0.25 SENSITIVE Sensitive     NITROFURANTOIN <=16 SENSITIVE Sensitive     TRIMETH/SULFA >=320 RESISTANT Resistant     AMPICILLIN/SULBACTAM >=32 RESISTANT Resistant     PIP/TAZO <=4 SENSITIVE Sensitive     * >=100,000 COLONIES/mL ESCHERICHIA COLI      Radiology Studies: CT ABDOMEN PELVIS W CONTRAST  Result Date: 05/20/2021 CLINICAL DATA:  Abdominal pain and fever. EXAM: CT ABDOMEN AND PELVIS WITH CONTRAST TECHNIQUE: Multidetector CT imaging of the abdomen and pelvis was performed using the standard protocol following bolus administration of intravenous contrast. CONTRAST:  OMNIPAQUE IOHEXOL 300 MG/ML  SOLN COMPARISON:  None. FINDINGS: Lower chest: The visualized lung bases are clear. No intra-abdominal free air.  Small free fluid in the pelvis. Hepatobiliary: No focal liver abnormality is seen. No gallstones, gallbladder wall thickening, or biliary dilatation. Pancreas: Unremarkable. No pancreatic ductal dilatation or surrounding inflammatory changes. Spleen: Normal in size without focal abnormality. Adrenals/Urinary Tract: The adrenal glands are unremarkable. There is heterogeneous enhancement of the right renal parenchyma which may be related to underlying parenchymal scarring. Correlation with urinalysis recommended to exclude pyelonephritis. No drainable fluid collection. There is no hydronephrosis on either side. The visualized ureters and urinary bladder appear unremarkable. Stomach/Bowel: Circumferential thickening and inflammatory changes of the rectosigmoid. There is no bowel obstruction. The appendix is normal. Vascular/Lymphatic: The abdominal aorta and IVC are unremarkable. No portal venous gas. Retroperitoneal adenopathy as well as several mildly enlarged perirectal lymph nodes. Reproductive: The prostate and seminal vesicles are grossly unremarkable. Other: None Musculoskeletal: No acute or  significant osseous findings. IMPRESSION: 1. Proctocolitis. Underlying malignancy is not excluded. Further evaluation with sigmoidoscopy after resolution of acute symptoms and inflammatory changes recommended. No drainable fluid collection or abscess. No bowel obstruction. Normal appendix. 2. Heterogeneous enhancement of the right renal parenchyma may be related to underlying parenchymal scarring. Correlation with urinalysis recommended to exclude pyelonephritis. No drainable fluid collection. 3. Retroperitoneal and perirectal adenopathy. Electronically Signed   By: Ceasar Mons.D.  On: 05/20/2021 02:59       LOS: 1 day   Jacob Gutierrez  [facility] Pager on [email or web address]  05/21/2021, 9:46 AM

## 2021-05-22 DIAGNOSIS — K529 Noninfective gastroenteritis and colitis, unspecified: Secondary | ICD-10-CM | POA: Diagnosis not present

## 2021-05-22 DIAGNOSIS — K1370 Unspecified lesions of oral mucosa: Secondary | ICD-10-CM

## 2021-05-22 DIAGNOSIS — B20 Human immunodeficiency virus [HIV] disease: Secondary | ICD-10-CM | POA: Diagnosis not present

## 2021-05-22 DIAGNOSIS — N12 Tubulo-interstitial nephritis, not specified as acute or chronic: Secondary | ICD-10-CM | POA: Diagnosis not present

## 2021-05-22 DIAGNOSIS — R21 Rash and other nonspecific skin eruption: Secondary | ICD-10-CM | POA: Diagnosis not present

## 2021-05-22 DIAGNOSIS — B04 Monkeypox: Secondary | ICD-10-CM

## 2021-05-22 LAB — COMPREHENSIVE METABOLIC PANEL
ALT: 12 U/L (ref 0–44)
AST: 22 U/L (ref 15–41)
Albumin: 2.3 g/dL — ABNORMAL LOW (ref 3.5–5.0)
Alkaline Phosphatase: 60 U/L (ref 38–126)
Anion gap: 6 (ref 5–15)
BUN: 14 mg/dL (ref 6–20)
CO2: 22 mmol/L (ref 22–32)
Calcium: 7.9 mg/dL — ABNORMAL LOW (ref 8.9–10.3)
Chloride: 100 mmol/L (ref 98–111)
Creatinine, Ser: 1.05 mg/dL (ref 0.61–1.24)
GFR, Estimated: >60 mL/min (ref >60)
Glucose, Bld: 107 mg/dL — ABNORMAL HIGH (ref 70–99)
Potassium: 3.4 mmol/L — ABNORMAL LOW (ref 3.5–5.1)
Sodium: 128 mmol/L — ABNORMAL LOW (ref 135–145)
Total Bilirubin: 0.3 mg/dL (ref 0.3–1.2)
Total Protein: 7.4 g/dL (ref 6.5–8.1)

## 2021-05-22 LAB — CBC
HCT: 28 % — ABNORMAL LOW (ref 39.0–52.0)
Hemoglobin: 9.4 g/dL — ABNORMAL LOW (ref 13.0–17.0)
MCH: 32 pg (ref 26.0–34.0)
MCHC: 33.6 g/dL (ref 30.0–36.0)
MCV: 95.2 fL (ref 80.0–100.0)
Platelets: 113 10*3/uL — ABNORMAL LOW (ref 150–400)
RBC: 2.94 MIL/uL — ABNORMAL LOW (ref 4.22–5.81)
RDW: 15.5 % (ref 11.5–15.5)
WBC: 5.5 10*3/uL (ref 4.0–10.5)
nRBC: 0 % (ref 0.0–0.2)

## 2021-05-22 LAB — RPR
RPR Ser Ql: REACTIVE — AB
RPR Titer: 1:1 {titer}

## 2021-05-22 LAB — HIV-1 RNA QUANT-NO REFLEX-BLD
HIV 1 RNA Quant: 18500 copies/mL
LOG10 HIV-1 RNA: 4.267 log10copy/mL

## 2021-05-22 LAB — MAGNESIUM: Magnesium: 2 mg/dL (ref 1.7–2.4)

## 2021-05-22 MED ORDER — MAGIC MOUTHWASH W/LIDOCAINE
15.0000 mL | Freq: Four times a day (QID) | ORAL | Status: DC | PRN
  Administered 2021-05-22 – 2021-05-24 (×3): 15 mL via ORAL
  Filled 2021-05-22 (×4): qty 15

## 2021-05-22 MED ORDER — POTASSIUM CHLORIDE CRYS ER 20 MEQ PO TBCR
40.0000 meq | EXTENDED_RELEASE_TABLET | Freq: Once | ORAL | Status: AC
  Administered 2021-05-22: 40 meq via ORAL
  Filled 2021-05-22: qty 2

## 2021-05-22 MED ORDER — TAMSULOSIN HCL 0.4 MG PO CAPS
0.4000 mg | ORAL_CAPSULE | Freq: Every day | ORAL | Status: DC
  Administered 2021-05-22 – 2021-05-25 (×4): 0.4 mg via ORAL
  Filled 2021-05-22 (×4): qty 1

## 2021-05-22 NOTE — Plan of Care (Signed)
  Problem: Nutrition: Goal: Adequate nutrition will be maintained Outcome: Progressing   Problem: Pain Managment: Goal: General experience of comfort will improve Outcome: Progressing   

## 2021-05-22 NOTE — Progress Notes (Signed)
Subjective:  He is upset that "no one thought about the monkeypox until recently   Antibiotics:  Anti-infectives (From admission, onward)    Start     Dose/Rate Route Frequency Ordered Stop   05/21/21 1800  tecovirimat (TPOXX) capsule 600 mg  Status:  Discontinued        600 mg Oral 2 times daily with meals 05/21/21 1211 05/21/21 1219   05/21/21 1230  tecovirimat (TPOXX) capsule 600 mg  Status:  Discontinued        600 mg Oral 2 times daily with meals 05/21/21 1219 05/21/21 1224   05/21/21 1230  tecovirimat (TPOXX) capsule 600 mg        600 mg Oral 2 times daily with meals 05/21/21 1224 06/04/21 0759   05/20/21 1800  sulfamethoxazole-trimethoprim (BACTRIM DS) 800-160 MG per tablet 1 tablet       Note to Pharmacy: Patient taking differently: Tuesday,Thursday,saturday     1 tablet Oral Every T-Th-Sa (1800) 05/20/21 0537     05/20/21 1800  metroNIDAZOLE (FLAGYL) IVPB 500 mg  Status:  Discontinued        500 mg 100 mL/hr over 60 Minutes Intravenous Every 12 hours 05/20/21 1707 05/21/21 1209   05/20/21 1200  meropenem (MERREM) 1 g in sodium chloride 0.9 % 100 mL IVPB        1 g 200 mL/hr over 30 Minutes Intravenous Every 8 hours 05/20/21 0544 05/24/21 2359   05/20/21 0800  Darunavir-Cobicistat-Emtricitabine-Tenofovir Alafenamide (SYMTUZA) 800-150-200-10 MG TABS 1 tablet        1 tablet Oral Daily with breakfast 05/20/21 0537     05/20/21 0130  meropenem (MERREM) 1 g in sodium chloride 0.9 % 100 mL IVPB        1 g 200 mL/hr over 30 Minutes Intravenous  Once 05/20/21 0126 05/20/21 0326       Medications: Scheduled Meds:  Darunavir-Cobicistat-Emtricitabine-Tenofovir Alafenamide  1 tablet Oral Q breakfast   enoxaparin (LOVENOX) injection  40 mg Subcutaneous Q24H   feeding supplement  1 Container Oral BID WC   pantoprazole  40 mg Oral BID   polyethylene glycol  17 g Oral Daily   sulfamethoxazole-trimethoprim  1 tablet Oral Q T,Th,Sat-1800   tamsulosin  0.4 mg Oral Daily    tecovirimat  600 mg Oral BID WC   Continuous Infusions:  meropenem (MERREM) IV 1 g (05/22/21 0553)   PRN Meds:.acetaminophen **OR** acetaminophen, magic mouthwash w/lidocaine, morphine injection, oxyCODONE    Objective: Weight change:   Intake/Output Summary (Last 24 hours) at 05/22/2021 1403 Last data filed at 05/22/2021 0739 Gross per 24 hour  Intake 507.31 ml  Output 250 ml  Net 257.31 ml   Blood pressure 114/80, pulse (!) 106, temperature 100.3 F (37.9 C), temperature source Oral, resp. rate 18, height 6\' 1"  (1.854 m), weight 81.6 kg, SpO2 98 %. Temp:  [97.9 F (36.6 C)-101.4 F (38.6 C)] 100.3 F (37.9 C) (10/29 1130) Pulse Rate:  [100-122] 106 (10/29 1130) Resp:  [18-20] 18 (10/29 1130) BP: (109-134)/(67-88) 114/80 (10/29 1130) SpO2:  [97 %-100 %] 98 % (10/29 1130)  Physical Exam: Physical Exam Constitutional:      Appearance: He is ill-appearing.  HENT:     Mouth/Throat:     Dentition: Abnormal dentition. Dental caries present.     Pharynx: Oropharyngeal exudate present.  Eyes:     General:        Right eye: No discharge.  Left eye: No discharge.     Extraocular Movements: Extraocular movements intact.  Cardiovascular:     Rate and Rhythm: Tachycardia present.  Pulmonary:     Effort: No respiratory distress.     Breath sounds: No wheezing.  Abdominal:     General: There is no distension.  Musculoskeletal:     Cervical back: Normal range of motion.  Skin:    General: Skin is warm and dry.     Findings: Rash present.  Neurological:     General: No focal deficit present.     Mental Status: He is alert and oriented to person, place, and time.      He has some exudative ulcerative changes as well as exophytic lesions on his tongue pictured below 05/22/2021:    Rash on arms, torso:       CBC:    BMET Recent Labs    05/21/21 0207 05/22/21 0020  NA 129* 128*  K 3.6 3.4*  CL 99 100  CO2 24 22  GLUCOSE 77 107*  BUN 13 14   CREATININE 1.19 1.05  CALCIUM 8.1* 7.9*     Liver Panel  Recent Labs    05/22/21 0020  PROT 7.4  ALBUMIN 2.3*  AST 22  ALT 12  ALKPHOS 60  BILITOT 0.3       Sedimentation Rate No results for input(s): ESRSEDRATE in the last 72 hours. C-Reactive Protein No results for input(s): CRP in the last 72 hours.  Micro Results: Recent Results (from the past 720 hour(s))  Urine Culture     Status: Abnormal   Collection Time: 05/05/21 10:10 AM   Specimen: Urine, Clean Catch  Result Value Ref Range Status   Specimen Description URINE, CLEAN CATCH  Final   Special Requests   Final    NONE Performed at [facility], [address], [place], [place] [address]    Culture (A)  Final    >=100,000 COLONIES/mL ESCHERICHIA COLI Confirmed Extended Spectrum Beta-Lactamase Producer (ESBL).  In bloodstream infections from ESBL organisms, carbapenems are preferred over piperacillin/tazobactam. They are shown to have a lower risk of mortality.    Report Status 05/08/2021 FINAL  Final   Organism ID, Bacteria ESCHERICHIA COLI (A)  Final      Susceptibility   Escherichia coli - MIC*    AMPICILLIN >=32 RESISTANT Resistant     CEFAZOLIN >=64 RESISTANT Resistant     CEFEPIME 16 RESISTANT Resistant     CEFTRIAXONE >=64 RESISTANT Resistant     CIPROFLOXACIN >=4 RESISTANT Resistant     GENTAMICIN >=16 RESISTANT Resistant     IMIPENEM <=0.25 SENSITIVE Sensitive     NITROFURANTOIN <=16 SENSITIVE Sensitive     TRIMETH/SULFA >=320 RESISTANT Resistant     AMPICILLIN/SULBACTAM >=32 RESISTANT Resistant     PIP/TAZO <=4 SENSITIVE Sensitive     * >=100,000 COLONIES/mL ESCHERICHIA COLI  Resp Panel by RT-PCR (Flu A&B, Covid) Nasopharyngeal Swab     Status: None   Collection Time: 05/19/21 10:53 PM   Specimen: Nasopharyngeal Swab; Nasopharyngeal(NP) swabs in vial transport medium  Result Value Ref Range Status   SARS Coronavirus 2 by RT PCR NEGATIVE NEGATIVE Final    Comment: (NOTE) SARS-CoV-2  target nucleic acids are NOT DETECTED.  The SARS-CoV-2 RNA is generally detectable in upper respiratory specimens during the acute phase of infection. The lowest concentration of SARS-CoV-2 viral copies this assay can detect is 138 copies/mL. A negative result does not preclude SARS-Cov-2 infection and should not  be used as the sole basis for treatment or other patient management decisions. A negative result may occur with  improper specimen collection/handling, submission of specimen other than nasopharyngeal swab, presence of viral mutation(s) within the areas targeted by this assay, and inadequate number of viral copies(<138 copies/mL). A negative result must be combined with clinical observations, patient history, and epidemiological information. The expected result is Negative.  Fact Sheet for Patients:  BloggerCourse.com  Fact Sheet for Healthcare Providers:  SeriousBroker.it  This test is no t yet approved or cleared by the [place] FDA and  has been authorized for detection and/or diagnosis of SARS-CoV-2 by FDA under an Emergency Use Authorization (EUA). This EUA will remain  in effect (meaning this test can be used) for the duration of the COVID-19 declaration under Section 564(b)(1) of the Act, 21 U.S.C.section 360bbb-3(b)(1), unless the authorization is terminated  or revoked sooner.       Influenza A by PCR NEGATIVE NEGATIVE Final   Influenza B by PCR NEGATIVE NEGATIVE Final    Comment: (NOTE) The Xpert Xpress SARS-CoV-2/FLU/RSV plus assay is intended as an aid in the diagnosis of influenza from Nasopharyngeal swab specimens and should not be used as a sole basis for treatment. Nasal washings and aspirates are unacceptable for Xpert Xpress SARS-CoV-2/FLU/RSV testing.  Fact Sheet for Patients: BloggerCourse.com  Fact Sheet for Healthcare  Providers: SeriousBroker.it  This test is not yet approved or cleared by the [place] FDA and has been authorized for detection and/or diagnosis of SARS-CoV-2 by FDA under an Emergency Use Authorization (EUA). This EUA will remain in effect (meaning this test can be used) for the duration of the COVID-19 declaration under Section 564(b)(1) of the Act, 21 U.S.C. section 360bbb-3(b)(1), unless the authorization is terminated or revoked.  Performed at [facility], [address] [place], [place] [address]   Blood culture (routine x 2)     Status: None (Preliminary result)   Collection Time: 05/20/21 12:00 AM   Specimen: BLOOD LEFT ARM  Result Value Ref Range Status   Specimen Description BLOOD LEFT ARM  Final   Special Requests   Final    BOTTLES DRAWN AEROBIC AND ANAEROBIC Blood Culture adequate volume   Culture   Final    NO GROWTH 2 DAYS Performed at [facility], [address] [place], [place] [address]    Report Status PENDING  Incomplete  Blood culture (routine x 2)     Status: None (Preliminary result)   Collection Time: 05/20/21 12:13 AM   Specimen: BLOOD RIGHT ARM  Result Value Ref Range Status   Specimen Description BLOOD RIGHT ARM  Final   Special Requests   Final    BOTTLES DRAWN AEROBIC ONLY Blood Culture adequate volume   Culture   Final    NO GROWTH 2 DAYS Performed at [facility], [address] [place], [place] [address]    Report Status PENDING  Incomplete  Urine Culture     Status: Abnormal   Collection Time: 05/20/21  2:16 AM   Specimen: Urine, Clean Catch  Result Value Ref Range Status   Specimen Description URINE, CLEAN CATCH  Final   Special Requests   Final    NONE Performed at [facility], [address] [place], [place] [address]    Culture (A)  Final    >=100,000 COLONIES/mL ESCHERICHIA COLI Confirmed Extended Spectrum Beta-Lactamase Producer (ESBL).  In bloodstream infections from  ESBL organisms, carbapenems  are preferred over piperacillin/tazobactam. They are shown to have a lower risk of mortality.    Report Status 05/21/2021 FINAL  Final   Organism ID, Bacteria ESCHERICHIA COLI (A)  Final      Susceptibility   Escherichia coli - MIC*    AMPICILLIN >=32 RESISTANT Resistant     CEFAZOLIN >=64 RESISTANT Resistant     CEFEPIME >=32 RESISTANT Resistant     CEFTRIAXONE >=64 RESISTANT Resistant     CIPROFLOXACIN >=4 RESISTANT Resistant     GENTAMICIN >=16 RESISTANT Resistant     IMIPENEM <=0.25 SENSITIVE Sensitive     NITROFURANTOIN <=16 SENSITIVE Sensitive     TRIMETH/SULFA >=320 RESISTANT Resistant     AMPICILLIN/SULBACTAM >=32 RESISTANT Resistant     PIP/TAZO <=4 SENSITIVE Sensitive     * >=100,000 COLONIES/mL ESCHERICHIA COLI    Studies/Results: No results found.    Assessment/Plan:  INTERVAL HISTORY: patient started on TPOXX   Principal Problem:   Proctocolitis Active Problems:   AIDS (acquired immune deficiency syndrome) (HCC)   HTN (hypertension)    Jacob Gutierrez is a 35 y.o. 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  His urinalysis had nitrates and pyuria and bacteria and urine culture essentially grown ESBL.  He had a CT of the abdomen pelvis showed some possible bladder wall thickening as well. He had been discharged on Keflex and with a bowel regimen but then was readmitted with rectal pain and a rash.  His exam seems highly concerning for monkeypox faction with proctocolitis and mucosal involvement.  He was started on TPOXX yeseterday   #1 Monkeypox:  Continue TPOXX to complete a course  Continue airborne contact precautions  #2 Oral lesions: I may consider adding an antifungal but I am holding off for now  #3 HIV/AIDS: He has terrible  insight and he is not honest about his adherence.  It appears that in September he must of been reasonably adherent at that time because his viral load came down to 17,100 copies from [place] and 54,000 but again most recent labs have shown a viral load over million copies.  I personally feel that he is at a point where there is no point in prescribing him antiviral therapy since he will not take it and there continues to be mythology of him being adherent to therapy.  Currently he is on SYMTUZA and Bactrim.  I think ultimately he will need a palliative care consult given that he will be coming to the end of his life soon if he cannot become adherent in a consistent manner.  He was being considered for our LATTITUDE study for non adherent patients but I think he is too nonadherent to be an appropriate participant in that study.  #4 ESBL UTI: We will complete a course of Carbapenem therapy.  38 minutes with the patient including face to face counseling of the patient the nature of his HIV and his most recent viral loads and CD4 counts, clinical diagnosis of monkeypox personally reviewing chest x-ray CT scan, recent CBC BMP viral load CD4 counts genotypes along with review of medical records before and during the visit and in coordination of his care.         LOS: 2 days   Acey Lav 05/22/2021, 2:03 PM

## 2021-05-22 NOTE — Progress Notes (Signed)
[facility] PROGRESS NOTE   Jacob Gutierrez NOM:[id number] DOB: 10-02-1985 DOA: 05/19/2021  PCP: Patient, No Pcp Per (Inactive)  Brief History/Interval Summary:  35 y.o. male with medical history significant of HIV/AIDS (CD4 count <35 on 04/29/2021), medication noncompliance, hypertension, substance abuse, history of gonorrhea, syphilis, pulmonary coccidioidomycosis.  Admitted in July 2022 for sepsis secondary to pyelonephritis secondary to ESBL E. coli.  Admitted again in August 2022 for sepsis secondary to group B strep bacteremia.  He presents to the ED complaining of rectal pain.  Febrile with temperature 100.4 F, slightly tachycardic.  Labs showing WBC 6.9, hemoglobin 10.7 (no significant change from baseline), platelet count 160k.  Sodium 130 (chronically low and at baseline), potassium 3.5, chloride 100, bicarb 22, BUN 12, creatinine 0.9, glucose 87.  UA with positive nitrite, large amount of leukocytes, greater than 50 WBCs, and many bacteria.  Urine and blood cultures pending.  Lactic acid normal x2.  INR 1.2.  COVID and influenza PCR negative.  CT abdomen pelvis showing findings consistent with proctocolitis; no drainable fluid collection or abscess.  Patient was hospitalized for further management.   Reason for Visit: ESBL E. coli pyelonephritis.  Diffuse papular rash.  Proctocolitis  Consultants: Infectious disease  Procedures: None    Subjective/Interval History: Patient does not report any improvement in his rash, rectal pain or mouth pain.  Continues to have fever.  Denies any new complaints such as shortness of breath, cough, diarrhea.  Does report that he has to strain a lot to pass urine.  Denies any dysuria however.       Assessment/Plan:  Proctocolitis/acute pyelonephritis/ESBL E. coli infection/sepsis present on admission Patient was noted to be febrile, tachycardic at the time of admission. CT scan showed proctocolitis without any abscess.   CT scan also  raised concern for pyelonephritis. Recent urine culture with ESBL E. coli.  Patient was placed on meropenem.  Flagyl was also added.  Patient seen by infectious disease.  Flagyl has been discontinued.  Urine culture from this hospitalization is also growing ESBL E. coli. Blood cultures negative so far.  Diffuse rash in the setting of HIV/AIDS He has diffuse papular rash over the skin.  He has oral lesions as well.  He also has lesions around his perianal area.   Etiology for the rash is not entirely clear but concerning for monkeypox per infectious disease.  Patient has been initiated on tecovirimat.  Testing has been ordered.  HIV/AIDS Patient also with history of gonorrhea and syphilis in the past.  His CD4 count has been less than 35 when recently checked.  He is on antiretroviral treatment and on Bactrim.    Difficulty with urination Patient mentions that he has been straining to pass urine.  No significant retention noted on bladder scan done last night.  Patient has had 2 CT scans of his abdomen pelvis done within the last few weeks.  Prostate noted to be normal in both of them.  One of the CT scan did show a moderate severity lobulated asymmetric predominantly posterior urinary bladder wall thickening.  Unclear if this is contributing to her symptoms.  We will do periodic bladder scans.  We will place him on Flomax for now.  If patient's symptoms do not improve may need to discuss with urology.   Essential hypertension Currently off all antihypertensives.  Blood pressure seems to be reasonably well controlled.  Iron deficiency anemia No evidence of overt blood loss.  Apparently not taking his oral iron supplementation.  Hemoglobin noted to be lower today.  No overt blood loss.  Likely dilutional drop.  Hyponatremia/hypokalemia Hyponatremia is chronic.  Continue to monitor.  Etiology is unclear but likely multifactorial. Replace potassium.  Check magnesium in the morning.  Stage II  decubitus in the coccyx area Pressure Injury 03/04/21 Coccyx Mid Stage 2 -  Partial thickness loss of dermis presenting as a shallow open injury with a red, pink wound bed without slough. (Active)  03/04/21 2000  Location: Coccyx  Location Orientation: Mid  Staging: Stage 2 -  Partial thickness loss of dermis presenting as a shallow open injury with a red, pink wound bed without slough.  Wound Description (Comments):   Present on Admission: Yes     DVT Prophylaxis: Lovenox Code Status: Full code Family Communication: Discussed with patient.  No family at bedside Disposition Plan: Hopefully return home when improved  Status is: Inpatient  Remains inpatient appropriate because: Need for IV antibiotic     Medications: Scheduled:  Darunavir-Cobicistat-Emtricitabine-Tenofovir Alafenamide  1 tablet Oral Q breakfast   enoxaparin (LOVENOX) injection  40 mg Subcutaneous Q24H   feeding supplement  1 Container Oral BID WC   pantoprazole  40 mg Oral BID   polyethylene glycol  17 g Oral Daily   potassium chloride  40 mEq Oral Once   sulfamethoxazole-trimethoprim  1 tablet Oral Q T,Th,Sat-1800   tecovirimat  600 mg Oral BID WC   Continuous:  meropenem (MERREM) IV 1 g (05/22/21 0553)   JJK:[id number] **OR** acetaminophen, morphine injection, oxyCODONE  Antibiotics: Anti-infectives (From admission, onward)    Start     Dose/Rate Route Frequency Ordered Stop   05/21/21 1800  tecovirimat (TPOXX) capsule 600 mg  Status:  Discontinued        600 mg Oral 2 times daily with meals 05/21/21 1211 05/21/21 1219   05/21/21 1230  tecovirimat (TPOXX) capsule 600 mg  Status:  Discontinued        600 mg Oral 2 times daily with meals 05/21/21 1219 05/21/21 1224   05/21/21 1230  tecovirimat (TPOXX) capsule 600 mg        600 mg Oral 2 times daily with meals 05/21/21 1224 06/04/21 0759   05/20/21 1800  sulfamethoxazole-trimethoprim (BACTRIM DS) 800-160 MG per tablet 1 tablet       Note to Pharmacy:  Patient taking differently: Tuesday,Thursday,saturday     1 tablet Oral Every T-Th-Sa (1800) 05/20/21 0537     05/20/21 1800  metroNIDAZOLE (FLAGYL) IVPB 500 mg  Status:  Discontinued        500 mg 100 mL/hr over 60 Minutes Intravenous Every 12 hours 05/20/21 1707 05/21/21 1209   05/20/21 1200  meropenem (MERREM) 1 g in sodium chloride 0.9 % 100 mL IVPB        1 g 200 mL/hr over 30 Minutes Intravenous Every 8 hours 05/20/21 0544 05/24/21 2359   05/20/21 0800  Darunavir-Cobicistat-Emtricitabine-Tenofovir Alafenamide (SYMTUZA) 800-150-200-10 MG TABS 1 tablet        1 tablet Oral Daily with breakfast 05/20/21 0537     05/20/21 0130  meropenem (MERREM) 1 g in sodium chloride 0.9 % 100 mL IVPB        1 g 200 mL/hr over 30 Minutes Intravenous  Once 05/20/21 0126 05/20/21 0326       Objective:  Vital Signs  Vitals:   05/22/21 0039 05/22/21 0517 05/22/21 0545 05/22/21 0738  BP: 132/88 132/79  134/75  Pulse: 100  (!) 105 (!) 105  Resp: 20 18  18  Temp: 99.3 F (37.4 C) 99.8 F (37.7 C)  100 F (37.8 C)  TempSrc: Oral Oral  Oral  SpO2: 100% 97%  99%  Weight:      Height:        Intake/Output Summary (Last 24 hours) at 05/22/2021 0914 Last data filed at 05/22/2021 0739 Gross per 24 hour  Intake 1187.31 ml  Output 250 ml  Net 937.31 ml    Filed Weights   05/20/21 1802  Weight: 81.6 kg    General appearance: Awake alert.  In no distress Continues to have lesions in his oral cavity.   Papular lesions all over his skin.  Sparing his palms. Resp: Clear to auscultation bilaterally.  Normal effort Cardio: S1-S2 is normal regular.  No S3-S4.  No rubs murmurs or bruit GI: Abdomen is soft.  Slightly tender in the suprapubic area without any rebound rigidity or guarding.  No masses organomegaly.  Bowel sounds present. Extremities: No edema.  Full range of motion of lower extremities. Neurologic: Alert and oriented x3.  No focal neurological deficits.     Lab Results:  Data  Reviewed: I have personally reviewed following labs and imaging studies  CBC: Recent Labs  Lab 05/19/21 1533 05/20/21 0920 05/21/21 0207 05/22/21 0020  WBC 6.9 6.0 5.7 5.5  NEUTROABS 4.1  --  3.4  --   HGB 10.7* 11.1* 11.0* 9.4*  HCT 31.5* 33.7* 32.9* 28.0*  MCV 94.9 96.3 96.5 95.2  PLT 160 146* 119* 113*     Basic Metabolic Panel: Recent Labs  Lab 05/19/21 1533 05/21/21 0207 05/22/21 0020  NA 130* 129* 128*  K 3.5 3.6 3.4*  CL 100 99 100  CO2 22 24 22   GLUCOSE 87 77 107*  BUN 12 13 14   CREATININE 0.94 1.19 1.05  CALCIUM 8.6* 8.1* 7.9*     GFR: Estimated Creatinine Clearance: 108.9 mL/min (by C-G formula based on SCr of 1.05 mg/dL).    Coagulation Profile: Recent Labs  Lab 05/20/21 0017  INR 1.2      Recent Results (from the past 240 hour(s))  Resp Panel by RT-PCR (Flu A&B, Covid) Nasopharyngeal Swab     Status: None   Collection Time: 05/19/21 10:53 PM   Specimen: Nasopharyngeal Swab; Nasopharyngeal(NP) swabs in vial transport medium  Result Value Ref Range Status   SARS Coronavirus 2 by RT PCR NEGATIVE NEGATIVE Final    Comment: (NOTE) SARS-CoV-2 target nucleic acids are NOT DETECTED.  The SARS-CoV-2 RNA is generally detectable in upper respiratory specimens during the acute phase of infection. The lowest concentration of SARS-CoV-2 viral copies this assay can detect is 138 copies/mL. A negative result does not preclude SARS-Cov-2 infection and should not be used as the sole basis for treatment or other patient management decisions. A negative result may occur with  improper specimen collection/handling, submission of specimen other than nasopharyngeal swab, presence of viral mutation(s) within the areas targeted by this assay, and inadequate number of viral copies(<138 copies/mL). A negative result must be combined with clinical observations, patient history, and epidemiological information. The expected result is Negative.  Fact Sheet for  Patients:  05/22/21  Fact Sheet for Healthcare Providers:  05/21/21  This test is no t yet approved or cleared by the [employer] FDA and  has been authorized for detection and/or diagnosis of SARS-CoV-2 by FDA under an Emergency Use Authorization (EUA). This EUA will remain  in effect (meaning this test can be used) for the duration of the  COVID-19 declaration under Section 564(b)(1) of the Act, 21 U.S.C.section 360bbb-3(b)(1), unless the authorization is terminated  or revoked sooner.       Influenza A by PCR NEGATIVE NEGATIVE Final   Influenza B by PCR NEGATIVE NEGATIVE Final    Comment: (NOTE) The Xpert Xpress SARS-CoV-2/FLU/RSV plus assay is intended as an aid in the diagnosis of influenza from Nasopharyngeal swab specimens and should not be used as a sole basis for treatment. Nasal washings and aspirates are unacceptable for Xpert Xpress SARS-CoV-2/FLU/RSV testing.  Fact Sheet for Patients: [employer]  Fact Sheet for Healthcare Providers: [employer]  This test is not yet approved or cleared by the [place] FDA and has been authorized for detection and/or diagnosis of SARS-CoV-2 by FDA under an Emergency Use Authorization (EUA). This EUA will remain in effect (meaning this test can be used) for the duration of the COVID-19 declaration under Section 564(b)(1) of the Act, 21 U.S.C. section 360bbb-3(b)(1), unless the authorization is terminated or revoked.  Performed at [facility], [address] [place], [place] [address]   Blood culture (routine x 2)     Status: None (Preliminary result)   Collection Time: 05/20/21 12:00 AM   Specimen: BLOOD LEFT ARM  Result Value Ref Range Status   Specimen Description BLOOD LEFT ARM  Final   Special Requests   Final    BOTTLES DRAWN AEROBIC AND ANAEROBIC Blood Culture adequate volume    Culture   Final    NO GROWTH 2 DAYS Performed at [facility], [address], [place], [place] [address]    Report Status PENDING  Incomplete  Blood culture (routine x 2)     Status: None (Preliminary result)   Collection Time: 05/20/21 12:13 AM   Specimen: BLOOD RIGHT ARM  Result Value Ref Range Status   Specimen Description BLOOD RIGHT ARM  Final   Special Requests   Final    BOTTLES DRAWN AEROBIC ONLY Blood Culture adequate volume   Culture   Final    NO GROWTH 2 DAYS Performed at [facility], [address], [place], [place] [address]    Report Status PENDING  Incomplete  Urine Culture     Status: Abnormal   Collection Time: 05/20/21  2:16 AM   Specimen: Urine, Clean Catch  Result Value Ref Range Status   Specimen Description URINE, CLEAN CATCH  Final   Special Requests   Final    NONE Performed at [facility], [address], [place], [place] [address]    Culture (A)  Final    >=100,000 COLONIES/mL ESCHERICHIA COLI Confirmed Extended Spectrum Beta-Lactamase Producer (ESBL).  In bloodstream infections from ESBL organisms, carbapenems are preferred over piperacillin/tazobactam. They are shown to have a lower risk of mortality.    Report Status 05/21/2021 FINAL  Final   Organism ID, Bacteria ESCHERICHIA COLI (A)  Final      Susceptibility   Escherichia coli - MIC*    AMPICILLIN >=32 RESISTANT Resistant     CEFAZOLIN >=64 RESISTANT Resistant     CEFEPIME >=32 RESISTANT Resistant     CEFTRIAXONE >=64 RESISTANT Resistant     CIPROFLOXACIN >=4 RESISTANT Resistant     GENTAMICIN >=16 RESISTANT Resistant     IMIPENEM <=0.25 SENSITIVE Sensitive     NITROFURANTOIN <=16 SENSITIVE Sensitive     TRIMETH/SULFA >=320 RESISTANT Resistant     AMPICILLIN/SULBACTAM >=32 RESISTANT Resistant     PIP/TAZO <=4 SENSITIVE Sensitive     * >=  100,000 COLONIES/mL ESCHERICHIA COLI       Radiology Studies: No results found.     LOS: 2 days   Jacob Gutierrez  [facility] Pager on [email or web address]  05/22/2021, 9:14 AM

## 2021-05-23 DIAGNOSIS — B04 Monkeypox: Secondary | ICD-10-CM | POA: Diagnosis not present

## 2021-05-23 DIAGNOSIS — B20 Human immunodeficiency virus [HIV] disease: Secondary | ICD-10-CM | POA: Diagnosis not present

## 2021-05-23 DIAGNOSIS — N12 Tubulo-interstitial nephritis, not specified as acute or chronic: Secondary | ICD-10-CM | POA: Diagnosis not present

## 2021-05-23 DIAGNOSIS — R21 Rash and other nonspecific skin eruption: Secondary | ICD-10-CM | POA: Diagnosis not present

## 2021-05-23 DIAGNOSIS — K529 Noninfective gastroenteritis and colitis, unspecified: Secondary | ICD-10-CM | POA: Diagnosis not present

## 2021-05-23 LAB — COMPREHENSIVE METABOLIC PANEL
ALT: 10 U/L (ref 0–44)
AST: 22 U/L (ref 15–41)
Albumin: 2.4 g/dL — ABNORMAL LOW (ref 3.5–5.0)
Alkaline Phosphatase: 61 U/L (ref 38–126)
Anion gap: 9 (ref 5–15)
BUN: 7 mg/dL (ref 6–20)
CO2: 19 mmol/L — ABNORMAL LOW (ref 22–32)
Calcium: 8 mg/dL — ABNORMAL LOW (ref 8.9–10.3)
Chloride: 99 mmol/L (ref 98–111)
Creatinine, Ser: 0.91 mg/dL (ref 0.61–1.24)
GFR, Estimated: >60 mL/min (ref >60)
Glucose, Bld: 104 mg/dL — ABNORMAL HIGH (ref 70–99)
Potassium: 3.2 mmol/L — ABNORMAL LOW (ref 3.5–5.1)
Sodium: 127 mmol/L — ABNORMAL LOW (ref 135–145)
Total Bilirubin: 0.4 mg/dL (ref 0.3–1.2)
Total Protein: 8.1 g/dL (ref 6.5–8.1)

## 2021-05-23 LAB — MAGNESIUM: Magnesium: 2.1 mg/dL (ref 1.7–2.4)

## 2021-05-23 MED ORDER — NYSTATIN 100000 UNIT/ML MT SUSP
5.0000 mL | Freq: Four times a day (QID) | OROMUCOSAL | Status: DC
  Administered 2021-05-23 – 2021-05-25 (×8): 500000 [IU] via ORAL
  Filled 2021-05-23 (×8): qty 5

## 2021-05-23 MED ORDER — SODIUM CHLORIDE 0.9 % IV SOLN
1.0000 g | Freq: Three times a day (TID) | INTRAVENOUS | Status: DC
  Administered 2021-05-23 – 2021-05-24 (×3): 1 g via INTRAVENOUS
  Filled 2021-05-23 (×5): qty 1

## 2021-05-23 NOTE — Progress Notes (Signed)
Subjective:  No new complaints still having rectal pain and odynophagia   Antibiotics:  Anti-infectives (From admission, onward)    Start     Dose/Rate Route Frequency Ordered Stop   05/21/21 1800  tecovirimat (TPOXX) capsule 600 mg  Status:  Discontinued        600 mg Oral 2 times daily with meals 05/21/21 1211 05/21/21 1219   05/21/21 1230  tecovirimat (TPOXX) capsule 600 mg  Status:  Discontinued        600 mg Oral 2 times daily with meals 05/21/21 1219 05/21/21 1224   05/21/21 1230  tecovirimat (TPOXX) capsule 600 mg        600 mg Oral 2 times daily with meals 05/21/21 1224 06/04/21 0759   05/20/21 1800  sulfamethoxazole-trimethoprim (BACTRIM DS) 800-160 MG per tablet 1 tablet       Note to Pharmacy: Patient taking differently: Tuesday,Thursday,saturday     1 tablet Oral Every T-Th-Sa (1800) 05/20/21 0537     05/20/21 1800  metroNIDAZOLE (FLAGYL) IVPB 500 mg  Status:  Discontinued        500 mg 100 mL/hr over 60 Minutes Intravenous Every 12 hours 05/20/21 1707 05/21/21 1209   05/20/21 1200  meropenem (MERREM) 1 g in sodium chloride 0.9 % 100 mL IVPB        1 g 200 mL/hr over 30 Minutes Intravenous Every 8 hours 05/20/21 0544 05/24/21 2359   05/20/21 0800  Darunavir-Cobicistat-Emtricitabine-Tenofovir Alafenamide (SYMTUZA) 800-150-200-10 MG TABS 1 tablet        1 tablet Oral Daily with breakfast 05/20/21 0537     05/20/21 0130  meropenem (MERREM) 1 g in sodium chloride 0.9 % 100 mL IVPB        1 g 200 mL/hr over 30 Minutes Intravenous  Once 05/20/21 0126 05/20/21 0326       Medications: Scheduled Meds:  Darunavir-Cobicistat-Emtricitabine-Tenofovir Alafenamide  1 tablet Oral Q breakfast   enoxaparin (LOVENOX) injection  40 mg Subcutaneous Q24H   feeding supplement  1 Container Oral BID WC   nystatin  5 mL Oral QID   pantoprazole  40 mg Oral BID   polyethylene glycol  17 g Oral Daily   sulfamethoxazole-trimethoprim  1 tablet Oral Q T,Th,Sat-1800   tamsulosin   0.4 mg Oral Daily   tecovirimat  600 mg Oral BID WC   Continuous Infusions:  meropenem (MERREM) IV 1 g (05/23/21 0616)   PRN Meds:.acetaminophen **OR** acetaminophen, magic mouthwash w/lidocaine, morphine injection, oxyCODONE    Objective: Weight change:   Intake/Output Summary (Last 24 hours) at 05/23/2021 1257 Last data filed at 05/23/2021 0749 Gross per 24 hour  Intake 960 ml  Output 1200 ml  Net -240 ml    Blood pressure 128/75, pulse (!) 109, temperature 99.8 F (37.7 C), temperature source Oral, resp. rate 18, height 6\' 1"  (1.854 m), weight 81.6 kg, SpO2 98 %. Temp:  [98.2 F (36.8 C)-99.8 F (37.7 C)] 99.8 F (37.7 C) (10/30 0747) Pulse Rate:  [103-109] 109 (10/30 0747) Resp:  [18-20] 18 (10/30 0747) BP: (111-128)/(71-81) 128/75 (10/30 0747) SpO2:  [98 %-100 %] 98 % (10/30 0747)  Physical Exam: Physical Exam Constitutional:      Appearance: He is well-developed.  HENT:     Head: Normocephalic and atraumatic.  Eyes:     Conjunctiva/sclera: Conjunctivae normal.  Cardiovascular:     Rate and Rhythm: Normal rate and regular rhythm.  Pulmonary:     Effort: Pulmonary effort is  normal. No respiratory distress.     Breath sounds: Normal breath sounds. No stridor. No wheezing.  Abdominal:     General: There is no distension.     Palpations: Abdomen is soft.  Musculoskeletal:        General: Normal range of motion.     Cervical back: Normal range of motion and neck supple.  Skin:    General: Skin is warm and dry.     Findings: Rash present. No erythema.  Neurological:     General: No focal deficit present.     Mental Status: He is alert and oriented to person, place, and time.  Psychiatric:        Mood and Affect: Mood normal.        Behavior: Behavior normal.        Thought Content: Thought content normal.        Judgment: Judgment normal.      He has some exudative ulcerative changes as well as exophytic lesions on his tongue pictured below  05/22/2021:     Tongue  05/23/2021:     Rash on arms, torso 05/22/2021      Newest lesions on left foot 05/23/2021:      CBC:    BMET Recent Labs    05/22/21 0020 05/23/21 0842  NA 128* 127*  K 3.4* 3.2*  CL 100 99  CO2 22 19*  GLUCOSE 107* 104*  BUN 14 7  CREATININE 1.05 0.91  CALCIUM 7.9* 8.0*      Liver Panel  Recent Labs    05/22/21 0020 05/23/21 0842  PROT 7.4 8.1  ALBUMIN 2.3* 2.4*  AST 22 22  ALT 12 10  ALKPHOS 60 61  BILITOT 0.3 0.4        Sedimentation Rate No results for input(s): ESRSEDRATE in the last 72 hours. C-Reactive Protein No results for input(s): CRP in the last 72 hours.  Micro Results: Recent Results (from the past 720 hour(s))  Urine Culture     Status: Abnormal   Collection Time: 05/05/21 10:10 AM   Specimen: Urine, Clean Catch  Result Value Ref Range Status   Specimen Description URINE, CLEAN CATCH  Final   Special Requests   Final    NONE Performed at [facility], [address] [place], [place] [address]    Culture (A)  Final    >=100,000 COLONIES/mL ESCHERICHIA COLI Confirmed Extended Spectrum Beta-Lactamase Producer (ESBL).  In bloodstream infections from ESBL organisms, carbapenems are preferred over piperacillin/tazobactam. They are shown to have a lower risk of mortality.    Report Status 05/08/2021 FINAL  Final   Organism ID, Bacteria ESCHERICHIA COLI (A)  Final      Susceptibility   Escherichia coli - MIC*    AMPICILLIN >=32 RESISTANT Resistant     CEFAZOLIN >=64 RESISTANT Resistant     CEFEPIME 16 RESISTANT Resistant     CEFTRIAXONE >=64 RESISTANT Resistant     CIPROFLOXACIN >=4 RESISTANT Resistant     GENTAMICIN >=16 RESISTANT Resistant     IMIPENEM <=0.25 SENSITIVE Sensitive     NITROFURANTOIN <=16 SENSITIVE Sensitive     TRIMETH/SULFA >=320 RESISTANT Resistant     AMPICILLIN/SULBACTAM >=32 RESISTANT Resistant     PIP/TAZO <=4 SENSITIVE Sensitive     * >=100,000 COLONIES/mL  ESCHERICHIA COLI  Resp Panel by RT-PCR (Flu A&B, Covid) Nasopharyngeal Swab     Status: None   Collection Time: 05/19/21 10:53 PM   Specimen: Nasopharyngeal Swab; Nasopharyngeal(NP) swabs  in vial transport medium  Result Value Ref Range Status   SARS Coronavirus 2 by RT PCR NEGATIVE NEGATIVE Final    Comment: (NOTE) SARS-CoV-2 target nucleic acids are NOT DETECTED.  The SARS-CoV-2 RNA is generally detectable in upper respiratory specimens during the acute phase of infection. The lowest concentration of SARS-CoV-2 viral copies this assay can detect is 138 copies/mL. A negative result does not preclude SARS-Cov-2 infection and should not be used as the sole basis for treatment or other patient management decisions. A negative result may occur with  improper specimen collection/handling, submission of specimen other than nasopharyngeal swab, presence of viral mutation(s) within the areas targeted by this assay, and inadequate number of viral copies(<138 copies/mL). A negative result must be combined with clinical observations, patient history, and epidemiological information. The expected result is Negative.  Fact Sheet for Patients:  BloggerCourse.com  Fact Sheet for Healthcare Providers:  SeriousBroker.it  This test is no t yet approved or cleared by the [place] FDA and  has been authorized for detection and/or diagnosis of SARS-CoV-2 by FDA under an Emergency Use Authorization (EUA). This EUA will remain  in effect (meaning this test can be used) for the duration of the COVID-19 declaration under Section 564(b)(1) of the Act, 21 U.S.C.section 360bbb-3(b)(1), unless the authorization is terminated  or revoked sooner.       Influenza A by PCR NEGATIVE NEGATIVE Final   Influenza B by PCR NEGATIVE NEGATIVE Final    Comment: (NOTE) The Xpert Xpress SARS-CoV-2/FLU/RSV plus assay is intended as an aid in the diagnosis of  influenza from Nasopharyngeal swab specimens and should not be used as a sole basis for treatment. Nasal washings and aspirates are unacceptable for Xpert Xpress SARS-CoV-2/FLU/RSV testing.  Fact Sheet for Patients: BloggerCourse.com  Fact Sheet for Healthcare Providers: SeriousBroker.it  This test is not yet approved or cleared by the [place] FDA and has been authorized for detection and/or diagnosis of SARS-CoV-2 by FDA under an Emergency Use Authorization (EUA). This EUA will remain in effect (meaning this test can be used) for the duration of the COVID-19 declaration under Section 564(b)(1) of the Act, 21 U.S.C. section 360bbb-3(b)(1), unless the authorization is terminated or revoked.  Performed at [facility], [address], [place], [place] [address]   Blood culture (routine x 2)     Status: None (Preliminary result)   Collection Time: 05/20/21 12:00 AM   Specimen: BLOOD LEFT ARM  Result Value Ref Range Status   Specimen Description BLOOD LEFT ARM  Final   Special Requests   Final    BOTTLES DRAWN AEROBIC AND ANAEROBIC Blood Culture adequate volume   Culture   Final    NO GROWTH 3 DAYS Performed at [facility], [address], [place], [place] [address]    Report Status PENDING  Incomplete  Blood culture (routine x 2)     Status: None (Preliminary result)   Collection Time: 05/20/21 12:13 AM   Specimen: BLOOD RIGHT ARM  Result Value Ref Range Status   Specimen Description BLOOD RIGHT ARM  Final   Special Requests   Final    BOTTLES DRAWN AEROBIC ONLY Blood Culture adequate volume   Culture   Final    NO GROWTH 3 DAYS Performed at [facility], [address], [place], [place] [address]    Report Status PENDING  Incomplete  Urine Culture     Status: Abnormal   Collection Time: 05/20/21  2:16  AM   Specimen: Urine, Clean Catch  Result Value Ref Range Status   Specimen Description URINE,  CLEAN CATCH  Final   Special Requests   Final    NONE Performed at [facility], [address] [place], [place] [address]    Culture (A)  Final    >=100,000 COLONIES/mL ESCHERICHIA COLI Confirmed Extended Spectrum Beta-Lactamase Producer (ESBL).  In bloodstream infections from ESBL organisms, carbapenems are preferred over piperacillin/tazobactam. They are shown to have a lower risk of mortality.    Report Status 05/21/2021 FINAL  Final   Organism ID, Bacteria ESCHERICHIA COLI (A)  Final      Susceptibility   Escherichia coli - MIC*    AMPICILLIN >=32 RESISTANT Resistant     CEFAZOLIN >=64 RESISTANT Resistant     CEFEPIME >=32 RESISTANT Resistant     CEFTRIAXONE >=64 RESISTANT Resistant     CIPROFLOXACIN >=4 RESISTANT Resistant     GENTAMICIN >=16 RESISTANT Resistant     IMIPENEM <=0.25 SENSITIVE Sensitive     NITROFURANTOIN <=16 SENSITIVE Sensitive     TRIMETH/SULFA >=320 RESISTANT Resistant     AMPICILLIN/SULBACTAM >=32 RESISTANT Resistant     PIP/TAZO <=4 SENSITIVE Sensitive     * >=100,000 COLONIES/mL ESCHERICHIA COLI    Studies/Results: No results found.    Assessment/Plan:  INTERVAL HISTORY: Patient is relatively stable still though with significant rectal pain and odynophagia   Principal Problem:   Proctocolitis Active Problems:   AIDS (acquired immune deficiency syndrome) (HCC)   HTN (hypertension)   Human monkeypox   Oral lesion    Jacob Gutierrez is a 35 y.o. 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  His urinalysis had nitrates and pyuria and bacteria and urine culture essentially grown ESBL.  He had a CT of the abdomen pelvis showed some possible bladder wall thickening as well. He had been discharged on Keflex and with a bowel regimen but then was  readmitted with rectal pain and a rash.  His exam seems highly concerning for monkeypox faction with proctocolitis and mucosal involvement.  He was started on TPOXX    #1 human monkeypox    Continue contact and airborne precautions  #2 odynophagia: Likely from monkeypox but also start nystatin swish and swallow  #3  HIV and AIDS with nonadherence to his antiretroviral regimen.  He is on SYMTUZA and Bactrim now but he is not been ever consistently taking these medications as an outpatient.  #4  ESBL UTI: We will finish meropenem today and DC it.        LOS: 3 days   Acey Lav 05/23/2021, 12:57 PM

## 2021-05-23 NOTE — Plan of Care (Signed)
  Problem: Nutrition: Goal: Adequate nutrition will be maintained Outcome: Progressing   Problem: Pain Managment: Goal: General experience of comfort will improve Outcome: Progressing   

## 2021-05-23 NOTE — Progress Notes (Signed)
[facility] PROGRESS NOTE   Jacob Gutierrez BSJ:[id number] DOB: 1986/05/12 DOA: 05/19/2021  PCP: Patient, No Pcp Per (Inactive)  Brief History/Interval Summary:  35 y.o. male with medical history significant of HIV/AIDS (CD4 count <35 on 04/29/2021), medication noncompliance, hypertension, substance abuse, history of gonorrhea, syphilis, pulmonary coccidioidomycosis.  Admitted in July 2022 for sepsis secondary to pyelonephritis secondary to ESBL E. coli.  Admitted again in August 2022 for sepsis secondary to group B strep bacteremia.  He presents to the ED complaining of rectal pain.  Febrile with temperature 100.4 F, slightly tachycardic.  Labs showing WBC 6.9, hemoglobin 10.7 (no significant change from baseline), platelet count 160k.  Sodium 130 (chronically low and at baseline), potassium 3.5, chloride 100, bicarb 22, BUN 12, creatinine 0.9, glucose 87.  UA with positive nitrite, large amount of leukocytes, greater than 50 WBCs, and many bacteria.  Urine and blood cultures pending.  Lactic acid normal x2.  INR 1.2.  COVID and influenza PCR negative.  CT abdomen pelvis showing findings consistent with proctocolitis; no drainable fluid collection or abscess.  Patient was hospitalized for further management.   Reason for Visit: ESBL E. coli pyelonephritis.  Diffuse papular rash.  Proctocolitis  Consultants: Infectious disease  Procedures: None    Subjective/Interval History: Patient mentions continued discomfort in his mouth and perianal area.  Denies any new complaints.  Continues to strain to pass urine but has been able to pass urine.  He was encouraged to try and urinate in the bathroom rather than while laying on the bed.  Denies any chest pain shortness of breath nausea or vomiting.     Assessment/Plan:  Proctocolitis/acute pyelonephritis/ESBL E. coli infection/sepsis present on admission Patient was noted to be febrile, tachycardic at the time of admission. CT scan  showed proctocolitis without any abscess.   CT scan also raised concern for pyelonephritis. Recent urine culture with ESBL E. coli.  Patient was placed on meropenem.  Flagyl was also added.  Patient seen by infectious disease.  Flagyl has been discontinued.  Urine culture from this hospitalization is also growing ESBL E. coli. Blood cultures negative so far. Further antibiotic management per infectious disease.  Looks like meropenem to be given for total of 5 days. Continues to be febrile once in a while the fever curve appears to be improving.  WBC is normal. Labs are pending from this morning.  Diffuse rash in the setting of HIV/AIDS He has diffuse papular rash over the skin.  He has oral lesions as well.  He also has lesions around his perianal area.   Etiology for the rash is not entirely clear but concerning for monkeypox per infectious disease.  Patient has been initiated on tecovirimat.  Testing has been ordered.  HIV/AIDS Patient also with history of gonorrhea and syphilis in the past.  His CD4 count has been less than 35 when recently checked.  He is on antiretroviral treatment and on Bactrim.    Difficulty with urination Patient mentions that he has been straining to pass urine.  No significant retention noted on bladder scan done last night.  Patient has had 2 CT scans of his abdomen pelvis done within the last few weeks.  Prostate noted to be normal in both of them.  One of the CT scan did show a moderate severity lobulated asymmetric predominantly posterior urinary bladder wall thickening.  Unclear if this is contributing to his symptoms.   Continue Flomax.  Asked patient to urinate as much as possible in  standing position. If symptoms do not improve or if he has significant retention will discuss with urology.    Essential hypertension Currently off all antihypertensives.  Blood pressure is reasonably well controlled.  Noted to be slightly tachycardic which could be due to fever and  pain issues.  Iron deficiency anemia No evidence of overt blood loss.  Apparently not taking his oral iron supplementation.  Hemoglobin was noted to be low yesterday.  Likely dilutional drop.  Will recheck.  Hyponatremia/hypokalemia Hyponatremia is chronic.  Continue to monitor.  Etiology is unclear but likely multifactorial. Potassium was replaced yesterday.  Magnesium 2.0.  Await labs from today.  Stage II decubitus in the coccyx area Pressure Injury 03/04/21 Coccyx Mid Stage 2 -  Partial thickness loss of dermis presenting as a shallow open injury with a red, pink wound bed without slough. (Active)  03/04/21 2000  Location: Coccyx  Location Orientation: Mid  Staging: Stage 2 -  Partial thickness loss of dermis presenting as a shallow open injury with a red, pink wound bed without slough.  Wound Description (Comments):   Present on Admission: Yes     DVT Prophylaxis: Lovenox Code Status: Full code Family Communication: Discussed with patient.  No family at bedside Disposition Plan: Hopefully return home when improved  Status is: Inpatient  Remains inpatient appropriate because: Need for IV antibiotic     Medications: Scheduled:  Darunavir-Cobicistat-Emtricitabine-Tenofovir Alafenamide  1 tablet Oral Q breakfast   enoxaparin (LOVENOX) injection  40 mg Subcutaneous Q24H   feeding supplement  1 Container Oral BID WC   pantoprazole  40 mg Oral BID   polyethylene glycol  17 g Oral Daily   sulfamethoxazole-trimethoprim  1 tablet Oral Q T,Th,Sat-1800   tamsulosin  0.4 mg Oral Daily   tecovirimat  600 mg Oral BID WC   Continuous:  meropenem (MERREM) IV 1 g (05/23/21 0616)   SEL:TRVUYEBXIDHWY **OR** acetaminophen, magic mouthwash w/lidocaine, morphine injection, oxyCODONE  Antibiotics: Anti-infectives (From admission, onward)    Start     Dose/Rate Route Frequency Ordered Stop   05/21/21 1800  tecovirimat (TPOXX) capsule 600 mg  Status:  Discontinued        600 mg Oral 2  times daily with meals 05/21/21 1211 05/21/21 1219   05/21/21 1230  tecovirimat (TPOXX) capsule 600 mg  Status:  Discontinued        600 mg Oral 2 times daily with meals 05/21/21 1219 05/21/21 1224   05/21/21 1230  tecovirimat (TPOXX) capsule 600 mg        600 mg Oral 2 times daily with meals 05/21/21 1224 06/04/21 0759   05/20/21 1800  sulfamethoxazole-trimethoprim (BACTRIM DS) 800-160 MG per tablet 1 tablet       Note to Pharmacy: Patient taking differently: Tuesday,Thursday,saturday     1 tablet Oral Every T-Th-Sa (1800) 05/20/21 0537     05/20/21 1800  metroNIDAZOLE (FLAGYL) IVPB 500 mg  Status:  Discontinued        500 mg 100 mL/hr over 60 Minutes Intravenous Every 12 hours 05/20/21 1707 05/21/21 1209   05/20/21 1200  meropenem (MERREM) 1 g in sodium chloride 0.9 % 100 mL IVPB        1 g 200 mL/hr over 30 Minutes Intravenous Every 8 hours 05/20/21 0544 05/24/21 2359   05/20/21 0800  Darunavir-Cobicistat-Emtricitabine-Tenofovir Alafenamide (SYMTUZA) 800-150-200-10 MG TABS 1 tablet        1 tablet Oral Daily with breakfast 05/20/21 0537     05/20/21 0130  meropenem (  MERREM) 1 g in sodium chloride 0.9 % 100 mL IVPB        1 g 200 mL/hr over 30 Minutes Intravenous  Once 05/20/21 0126 05/20/21 0326       Objective:  Vital Signs  Vitals:   05/22/21 2043 05/22/21 2333 05/23/21 0612 05/23/21 0747  BP: 115/80 121/81 111/71 128/75  Pulse:  (!) 106 (!) 108 (!) 109  Resp: 18 20 18 18   Temp:  98.2 F (36.8 C) 99 F (37.2 C) 99.8 F (37.7 C)  TempSrc:  Oral Oral Oral  SpO2: 100% 99% 98% 98%  Weight:      Height:        Intake/Output Summary (Last 24 hours) at 05/23/2021 0925 Last data filed at 05/23/2021 0749 Gross per 24 hour  Intake 960 ml  Output 1200 ml  Net -240 ml    Filed Weights   05/20/21 1802  Weight: 81.6 kg    General appearance: Awake alert.  In no distress Multiple lesions noted on his tongue. Resp: Clear to auscultation bilaterally.  Normal  effort Cardio: S1-S2 is normal regular.  No S3-S4.  No rubs murmurs or bruit GI: Abdomen is soft.  Remains slightly tender in the suprapubic area without any rebound rigidity or guarding.  No masses organomegaly  Extremities: No edema.  Full range of motion of lower extremities. Papular rash noted diffusely.  Few of them appear to have vesicles specially in the left lower extremity. Neurologic: Alert and oriented x3.  No focal neurological deficits.       Lab Results:  Data Reviewed: I have personally reviewed following labs and imaging studies  CBC: Recent Labs  Lab 05/19/21 1533 05/20/21 0920 05/21/21 0207 05/22/21 0020  WBC 6.9 6.0 5.7 5.5  NEUTROABS 4.1  --  3.4  --   HGB 10.7* 11.1* 11.0* 9.4*  HCT 31.5* 33.7* 32.9* 28.0*  MCV 94.9 96.3 96.5 95.2  PLT 160 146* 119* 113*     Basic Metabolic Panel: Recent Labs  Lab 05/19/21 1533 05/21/21 0207 05/22/21 0020 05/22/21 0730  NA 130* 129* 128*  --   K 3.5 3.6 3.4*  --   CL 100 99 100  --   CO2 22 24 22   --   GLUCOSE 87 77 107*  --   BUN 12 13 14   --   CREATININE 0.94 1.19 1.05  --   CALCIUM 8.6* 8.1* 7.9*  --   MG  --   --   --  2.0     GFR: Estimated Creatinine Clearance: 108.9 mL/min (by C-G formula based on SCr of 1.05 mg/dL).    Coagulation Profile: Recent Labs  Lab 05/20/21 0017  INR 1.2      Recent Results (from the past 240 hour(s))  Resp Panel by RT-PCR (Flu A&B, Covid) Nasopharyngeal Swab     Status: None   Collection Time: 05/19/21 10:53 PM   Specimen: Nasopharyngeal Swab; Nasopharyngeal(NP) swabs in vial transport medium  Result Value Ref Range Status   SARS Coronavirus 2 by RT PCR NEGATIVE NEGATIVE Final    Comment: (NOTE) SARS-CoV-2 target nucleic acids are NOT DETECTED.  The SARS-CoV-2 RNA is generally detectable in upper respiratory specimens during the acute phase of infection. The lowest concentration of SARS-CoV-2 viral copies this assay can detect is 138 copies/mL. A  negative result does not preclude SARS-Cov-2 infection and should not be used as the sole basis for treatment or other patient management decisions. A negative result may occur  with  improper specimen collection/handling, submission of specimen other than nasopharyngeal swab, presence of viral mutation(s) within the areas targeted by this assay, and inadequate number of viral copies(<138 copies/mL). A negative result must be combined with clinical observations, patient history, and epidemiological information. The expected result is Negative.  Fact Sheet for Patients:  BloggerCourse.com  Fact Sheet for Healthcare Providers:  SeriousBroker.it  This test is no t yet approved or cleared by the [place] FDA and  has been authorized for detection and/or diagnosis of SARS-CoV-2 by FDA under an Emergency Use Authorization (EUA). This EUA will remain  in effect (meaning this test can be used) for the duration of the COVID-19 declaration under Section 564(b)(1) of the Act, 21 U.S.C.section 360bbb-3(b)(1), unless the authorization is terminated  or revoked sooner.       Influenza A by PCR NEGATIVE NEGATIVE Final   Influenza B by PCR NEGATIVE NEGATIVE Final    Comment: (NOTE) The Xpert Xpress SARS-CoV-2/FLU/RSV plus assay is intended as an aid in the diagnosis of influenza from Nasopharyngeal swab specimens and should not be used as a sole basis for treatment. Nasal washings and aspirates are unacceptable for Xpert Xpress SARS-CoV-2/FLU/RSV testing.  Fact Sheet for Patients: BloggerCourse.com  Fact Sheet for Healthcare Providers: SeriousBroker.it  This test is not yet approved or cleared by the [place] FDA and has been authorized for detection and/or diagnosis of SARS-CoV-2 by FDA under an Emergency Use Authorization (EUA). This EUA will remain in effect (meaning this test can  be used) for the duration of the COVID-19 declaration under Section 564(b)(1) of the Act, 21 U.S.C. section 360bbb-3(b)(1), unless the authorization is terminated or revoked.  Performed at [facility], [address] [place], [place] [address]   Blood culture (routine x 2)     Status: None (Preliminary result)   Collection Time: 05/20/21 12:00 AM   Specimen: BLOOD LEFT ARM  Result Value Ref Range Status   Specimen Description BLOOD LEFT ARM  Final   Special Requests   Final    BOTTLES DRAWN AEROBIC AND ANAEROBIC Blood Culture adequate volume   Culture   Final    NO GROWTH 3 DAYS Performed at [facility], [address] [place], [place] [address]    Report Status PENDING  Incomplete  Blood culture (routine x 2)     Status: None (Preliminary result)   Collection Time: 05/20/21 12:13 AM   Specimen: BLOOD RIGHT ARM  Result Value Ref Range Status   Specimen Description BLOOD RIGHT ARM  Final   Special Requests   Final    BOTTLES DRAWN AEROBIC ONLY Blood Culture adequate volume   Culture   Final    NO GROWTH 3 DAYS Performed at [facility], [address], [place], [place] [address]    Report Status PENDING  Incomplete  Urine Culture     Status: Abnormal   Collection Time: 05/20/21  2:16 AM   Specimen: Urine, Clean Catch  Result Value Ref Range Status   Specimen Description URINE, CLEAN CATCH  Final   Special Requests   Final    NONE Performed at [facility], [address] [place], [place] [address]    Culture (A)  Final    >=100,000 COLONIES/mL ESCHERICHIA COLI Confirmed Extended Spectrum Beta-Lactamase Producer (ESBL).  In bloodstream infections from ESBL organisms, carbapenems are preferred over piperacillin/tazobactam. They are shown to have a lower risk of mortality.    Report Status  05/21/2021 FINAL  Final   Organism ID, Bacteria ESCHERICHIA COLI (A)  Final      Susceptibility   Escherichia coli - MIC*    AMPICILLIN >=32 RESISTANT  Resistant     CEFAZOLIN >=64 RESISTANT Resistant     CEFEPIME >=32 RESISTANT Resistant     CEFTRIAXONE >=64 RESISTANT Resistant     CIPROFLOXACIN >=4 RESISTANT Resistant     GENTAMICIN >=16 RESISTANT Resistant     IMIPENEM <=0.25 SENSITIVE Sensitive     NITROFURANTOIN <=16 SENSITIVE Sensitive     TRIMETH/SULFA >=320 RESISTANT Resistant     AMPICILLIN/SULBACTAM >=32 RESISTANT Resistant     PIP/TAZO <=4 SENSITIVE Sensitive     * >=100,000 COLONIES/mL ESCHERICHIA COLI       Radiology Studies: No results found.     LOS: 3 days   Fatih Stalvey [employer] on [email or web address]  05/23/2021, 9:25 AM

## 2021-05-23 NOTE — Progress Notes (Signed)
Patient refused to have IV started on him. He stated he was a difficult stick.

## 2021-05-24 ENCOUNTER — Other Ambulatory Visit (HOSPITAL_COMMUNITY): Payer: Self-pay

## 2021-05-24 DIAGNOSIS — B04 Monkeypox: Secondary | ICD-10-CM | POA: Diagnosis not present

## 2021-05-24 DIAGNOSIS — N12 Tubulo-interstitial nephritis, not specified as acute or chronic: Secondary | ICD-10-CM | POA: Diagnosis not present

## 2021-05-24 DIAGNOSIS — B20 Human immunodeficiency virus [HIV] disease: Secondary | ICD-10-CM | POA: Diagnosis not present

## 2021-05-24 DIAGNOSIS — R21 Rash and other nonspecific skin eruption: Secondary | ICD-10-CM | POA: Diagnosis not present

## 2021-05-24 DIAGNOSIS — K529 Noninfective gastroenteritis and colitis, unspecified: Secondary | ICD-10-CM | POA: Diagnosis not present

## 2021-05-24 LAB — GC/CHLAMYDIA PROBE AMP (~~LOC~~) NOT AT ARMC
Chlamydia: NEGATIVE
Chlamydia: NEGATIVE
Chlamydia: NEGATIVE
Comment: NEGATIVE
Comment: NEGATIVE
Comment: NEGATIVE
Comment: NORMAL
Comment: NORMAL
Comment: NORMAL
Neisseria Gonorrhea: NEGATIVE
Neisseria Gonorrhea: NEGATIVE
Neisseria Gonorrhea: NEGATIVE

## 2021-05-24 LAB — COMPREHENSIVE METABOLIC PANEL
ALT: 10 U/L (ref 0–44)
AST: 21 U/L (ref 15–41)
Albumin: 2.4 g/dL — ABNORMAL LOW (ref 3.5–5.0)
Alkaline Phosphatase: 59 U/L (ref 38–126)
Anion gap: 8 (ref 5–15)
BUN: 9 mg/dL (ref 6–20)
CO2: 17 mmol/L — ABNORMAL LOW (ref 22–32)
Calcium: 8.1 mg/dL — ABNORMAL LOW (ref 8.9–10.3)
Chloride: 105 mmol/L (ref 98–111)
Creatinine, Ser: 0.98 mg/dL (ref 0.61–1.24)
GFR, Estimated: >60 mL/min (ref >60)
Glucose, Bld: 100 mg/dL — ABNORMAL HIGH (ref 70–99)
Potassium: 3.2 mmol/L — ABNORMAL LOW (ref 3.5–5.1)
Sodium: 130 mmol/L — ABNORMAL LOW (ref 135–145)
Total Bilirubin: 0.4 mg/dL (ref 0.3–1.2)
Total Protein: 7.9 g/dL (ref 6.5–8.1)

## 2021-05-24 LAB — T.PALLIDUM AB, TOTAL: T Pallidum Abs: REACTIVE — AB

## 2021-05-24 MED ORDER — POTASSIUM CHLORIDE 20 MEQ PO PACK
40.0000 meq | PACK | Freq: Two times a day (BID) | ORAL | Status: AC
  Administered 2021-05-24 (×2): 40 meq via ORAL
  Filled 2021-05-24 (×2): qty 2

## 2021-05-24 NOTE — Progress Notes (Signed)
[facility] PROGRESS NOTE   Jacob Gutierrez [id number] DOB: 01-May-1986 DOA: 05/19/2021  PCP: Patient, No Pcp Per (Inactive)  Brief History/Interval Summary:  35 y.o. male with medical history significant of HIV/AIDS (CD4 count <35 on 04/29/2021), medication noncompliance, hypertension, substance abuse, history of gonorrhea, syphilis, pulmonary coccidioidomycosis.  Admitted in July 2022 for sepsis secondary to pyelonephritis secondary to ESBL E. coli.  Admitted again in August 2022 for sepsis secondary to group B strep bacteremia.  He presents to the ED complaining of rectal pain.  Febrile with temperature 100.4 F, slightly tachycardic.  Labs showing WBC 6.9, hemoglobin 10.7 (no significant change from baseline), platelet count 160k.  Sodium 130 (chronically low and at baseline), potassium 3.5, chloride 100, bicarb 22, BUN 12, creatinine 0.9, glucose 87.  UA with positive nitrite, large amount of leukocytes, greater than 50 WBCs, and many bacteria.  Urine and blood cultures pending.  Lactic acid normal x2.  INR 1.2.  COVID and influenza PCR negative.  CT abdomen pelvis showing findings consistent with proctocolitis; no drainable fluid collection or abscess.  Patient was hospitalized for further management.   Reason for Visit: ESBL E. coli pyelonephritis.  Diffuse papular rash.  Proctocolitis  Consultants: Infectious disease  Procedures: None    Subjective/Interval History: Patient continues to have pain in his mouth as well as in the perianal area.  Feels that it is slightly better compared to how it was last week.  He is able to urinate though sometimes he has to strain.  No other complaints offered.     Assessment/Plan:  Proctocolitis/acute pyelonephritis/ESBL E. coli infection/sepsis present on admission Patient was noted to be febrile, tachycardic at the time of admission. CT scan showed proctocolitis without any abscess.   CT scan also raised concern for pyelonephritis.  Recent urine culture with ESBL E. coli.  Patient was initially placed on meropenem and Flagyl.  Flagyl was discontinued by ID. ESBL E. coli noted in the urine.  5-day treatment with meropenem has been recommended by infectious disease.  Seems to be stable.  Fever curve appears to have improved.  Diffuse rash concerning for Monkeypox in the setting of HIV/AIDS He has diffuse papular rash over the skin.  He has oral lesions as well.  He also has lesions around his perianal area.   Monkeypox is high in the differential per infectious disease.  Patient has been started out tecovirimat by infectious disease.  Testing has been ordered and results are pending.    HIV/AIDS Patient also with history of gonorrhea and syphilis in the past.  His CD4 count has been less than 35 when recently checked.  He is on antiretroviral treatment and on Bactrim.  Apparently compliance has been an issue in the past.  Patient however mentioned that he has been compliant with his medications.  Difficulty with urination Patient mentions that he has been straining to pass urine.  No retention was noted on bladder scan.  Patient has had 2 CT scans of his abdomen pelvis done within the last few weeks.  Prostate noted to be normal in both of them.  One of the CT scan did show a moderate severity lobulated asymmetric predominantly posterior urinary bladder wall thickening.  Unclear if this is contributing to his symptoms.   He is able to urinate though with some straining.  Continue Flomax.  Will  need to follow-up with urology in the outpatient setting for abnormal appearing bladder on CT scan..   Essential hypertension Noted to be  on nifedipine prior to admission.  This is currently on hold.  Blood pressure is reasonably well controlled.    Iron deficiency anemia No evidence of overt blood loss.  Apparently not taking his oral iron supplementation.  Hemoglobin was noted to be low yesterday.  Likely dilutional drop.  Hemoglobin  not checked today.  Will order for tomorrow.  Hyponatremia/hypokalemia Hyponatremia is chronic.  Continue to monitor.  Etiology is unclear but likely multifactorial.  Noted to be stable. Potassium remains low.  Will be aggressively supplemented.  Magnesium was 2.1.  Also noted to have low bicarbonate levels.    Stage II decubitus in the coccyx area Pressure Injury 03/04/21 Coccyx Mid Stage 2 -  Partial thickness loss of dermis presenting as a shallow open injury with a red, pink wound bed without slough. (Active)  03/04/21 2000  Location: Coccyx  Location Orientation: Mid  Staging: Stage 2 -  Partial thickness loss of dermis presenting as a shallow open injury with a red, pink wound bed without slough.  Wound Description (Comments):   Present on Admission: Yes     DVT Prophylaxis: Lovenox Code Status: Full code Family Communication: Discussed with patient.  No family at bedside Disposition Plan: Hopefully return home when improved.  Continue to mobilize.  Status is: Inpatient  Remains inpatient appropriate because: Need for IV antibiotic     Medications: Scheduled:  Darunavir-Cobicistat-Emtricitabine-Tenofovir Alafenamide  1 tablet Oral Q breakfast   enoxaparin (LOVENOX) injection  40 mg Subcutaneous Q24H   feeding supplement  1 Container Oral BID WC   nystatin  5 mL Oral QID   pantoprazole  40 mg Oral BID   polyethylene glycol  17 g Oral Daily   potassium chloride  40 mEq Oral BID   sulfamethoxazole-trimethoprim  1 tablet Oral Q T,Th,Sat-1800   tamsulosin  0.4 mg Oral Daily   tecovirimat  600 mg Oral BID WC   Continuous:  meropenem (MERREM) IV 1 g (05/24/21 0538)   [id number]:[id number] **OR** acetaminophen, magic mouthwash w/lidocaine, morphine injection, oxyCODONE  Antibiotics: Anti-infectives (From admission, onward)    Start     Dose/Rate Route Frequency Ordered Stop   05/23/21 1400  meropenem (MERREM) 1 g in sodium chloride 0.9 % 100 mL IVPB        1 g 200 mL/hr  over 30 Minutes Intravenous Every 8 hours 05/23/21 1302 05/25/21 1359   05/21/21 1800  tecovirimat (TPOXX) capsule 600 mg  Status:  Discontinued        600 mg Oral 2 times daily with meals 05/21/21 1211 05/21/21 1219   05/21/21 1230  tecovirimat (TPOXX) capsule 600 mg  Status:  Discontinued        600 mg Oral 2 times daily with meals 05/21/21 1219 05/21/21 1224   05/21/21 1230  tecovirimat (TPOXX) capsule 600 mg        600 mg Oral 2 times daily with meals 05/21/21 1224 06/04/21 0759   05/20/21 1800  sulfamethoxazole-trimethoprim (BACTRIM DS) 800-160 MG per tablet 1 tablet       Note to Pharmacy: Patient taking differently: Tuesday,Thursday,saturday     1 tablet Oral Every T-Th-Sa (1800) 05/20/21 0537     05/20/21 1800  metroNIDAZOLE (FLAGYL) IVPB 500 mg  Status:  Discontinued        500 mg 100 mL/hr over 60 Minutes Intravenous Every 12 hours 05/20/21 1707 05/21/21 1209   05/20/21 1200  meropenem (MERREM) 1 g in sodium chloride 0.9 % 100 mL IVPB  Status:  Discontinued  1 g 200 mL/hr over 30 Minutes Intravenous Every 8 hours 05/20/21 0544 05/23/21 1302   05/20/21 0800  Darunavir-Cobicistat-Emtricitabine-Tenofovir Alafenamide (SYMTUZA) 800-150-200-10 MG TABS 1 tablet        1 tablet Oral Daily with breakfast 05/20/21 0537     05/20/21 0130  meropenem (MERREM) 1 g in sodium chloride 0.9 % 100 mL IVPB        1 g 200 mL/hr over 30 Minutes Intravenous  Once 05/20/21 0126 05/20/21 0326       Objective:  Vital Signs  Vitals:   05/23/21 0747 05/23/21 1533 05/23/21 2047 05/24/21 0539  BP: 128/75 111/68 115/69 112/84  Pulse: (!) 109 (!) 108 (!) 104 100  Resp: 18 18 18 18   Temp: 99.8 F (37.7 C) 98.3 F (36.8 C) 98.3 F (36.8 C) 98.8 F (37.1 C)  TempSrc: Oral Axillary Oral Oral  SpO2: 98% 98% 100% 99%  Weight:      Height:        Intake/Output Summary (Last 24 hours) at 05/24/2021 0855 Last data filed at 05/23/2021 2053 Gross per 24 hour  Intake 1140 ml  Output 1150 ml   Net -10 ml    Filed Weights   05/20/21 1802  Weight: 81.6 kg    General appearance: Awake alert.  In no distress Oral lesions appear to be improving. Resp: Clear to auscultation bilaterally.  Normal effort Cardio: S1-S2 is normal regular.  No S3-S4.  No rubs murmurs or bruit GI: Abdomen is soft.  nondistended.  Bowel sounds are present normal.  No masses organomegaly.  Less tender in the suprapubic area. Extremities: No edema.  Full range of motion of lower extremities. Papular rash noted diffusely some of them pustular. Neurologic: Alert and oriented x3.  No focal neurological deficits.       Lab Results:  Data Reviewed: I have personally reviewed following labs and imaging studies  CBC: Recent Labs  Lab 05/19/21 1533 05/20/21 0920 05/21/21 0207 05/22/21 0020  WBC 6.9 6.0 5.7 5.5  NEUTROABS 4.1  --  3.4  --   HGB 10.7* 11.1* 11.0* 9.4*  HCT 31.5* 33.7* 32.9* 28.0*  MCV 94.9 96.3 96.5 95.2  PLT 160 146* 119* 113*     Basic Metabolic Panel: Recent Labs  Lab 05/19/21 1533 05/21/21 0207 05/22/21 0020 05/22/21 0730 05/23/21 0842 05/24/21 0044  NA 130* 129* 128*  --  127* 130*  K 3.5 3.6 3.4*  --  3.2* 3.2*  CL 100 99 100  --  99 105  CO2 22 24 22   --  19* 17*  GLUCOSE 87 77 107*  --  104* 100*  BUN 12 13 14   --  7 9  CREATININE 0.94 1.19 1.05  --  0.91 0.98  CALCIUM 8.6* 8.1* 7.9*  --  8.0* 8.1*  MG  --   --   --  2.0 2.1  --      GFR: Estimated Creatinine Clearance: 116.6 mL/min (by C-G formula based on SCr of 0.98 mg/dL).    Coagulation Profile: Recent Labs  Lab 05/20/21 0017  INR 1.2      Recent Results (from the past 240 hour(s))  Resp Panel by RT-PCR (Flu A&B, Covid) Nasopharyngeal Swab     Status: None   Collection Time: 05/19/21 10:53 PM   Specimen: Nasopharyngeal Swab; Nasopharyngeal(NP) swabs in vial transport medium  Result Value Ref Range Status   SARS Coronavirus 2 by RT PCR NEGATIVE NEGATIVE Final    Comment:  (  NOTE) SARS-CoV-2 target nucleic acids are NOT DETECTED.  The SARS-CoV-2 RNA is generally detectable in upper respiratory specimens during the acute phase of infection. The lowest concentration of SARS-CoV-2 viral copies this assay can detect is 138 copies/mL. A negative result does not preclude SARS-Cov-2 infection and should not be used as the sole basis for treatment or other patient management decisions. A negative result may occur with  improper specimen collection/handling, submission of specimen other than nasopharyngeal swab, presence of viral mutation(s) within the areas targeted by this assay, and inadequate number of viral copies(<138 copies/mL). A negative result must be combined with clinical observations, patient history, and epidemiological information. The expected result is Negative.  Fact Sheet for Patients:  EntrepreneurPulse.com.au  Fact Sheet for Healthcare Providers:  IncredibleEmployment.be  This test is no t yet approved or cleared by the [place] FDA and  has been authorized for detection and/or diagnosis of SARS-CoV-2 by FDA under an Emergency Use Authorization (EUA). This EUA will remain  in effect (meaning this test can be used) for the duration of the COVID-19 declaration under Section 564(b)(1) of the Act, 21 U.S.C.section 360bbb-3(b)(1), unless the authorization is terminated  or revoked sooner.       Influenza A by PCR NEGATIVE NEGATIVE Final   Influenza B by PCR NEGATIVE NEGATIVE Final    Comment: (NOTE) The Xpert Xpress SARS-CoV-2/FLU/RSV plus assay is intended as an aid in the diagnosis of influenza from Nasopharyngeal swab specimens and should not be used as a sole basis for treatment. Nasal washings and aspirates are unacceptable for Xpert Xpress SARS-CoV-2/FLU/RSV testing.  Fact Sheet for Patients: EntrepreneurPulse.com.au  Fact Sheet for Healthcare  Providers: IncredibleEmployment.be  This test is not yet approved or cleared by the [place] FDA and has been authorized for detection and/or diagnosis of SARS-CoV-2 by FDA under an Emergency Use Authorization (EUA). This EUA will remain in effect (meaning this test can be used) for the duration of the COVID-19 declaration under Section 564(b)(1) of the Act, 21 U.S.C. section 360bbb-3(b)(1), unless the authorization is terminated or revoked.  Performed at [facility], [place] [address], [place], [place] [address]   Blood culture (routine x 2)     Status: None (Preliminary result)   Collection Time: 05/20/21 12:00 AM   Specimen: BLOOD LEFT ARM  Result Value Ref Range Status   Specimen Description BLOOD LEFT ARM  Final   Special Requests   Final    BOTTLES DRAWN AEROBIC AND ANAEROBIC Blood Culture adequate volume   Culture   Final    NO GROWTH 4 DAYS Performed at [facility], [place] [address], [place], [place] [address]    Report Status PENDING  Incomplete  Blood culture (routine x 2)     Status: None (Preliminary result)   Collection Time: 05/20/21 12:13 AM   Specimen: BLOOD RIGHT ARM  Result Value Ref Range Status   Specimen Description BLOOD RIGHT ARM  Final   Special Requests   Final    BOTTLES DRAWN AEROBIC ONLY Blood Culture adequate volume   Culture   Final    NO GROWTH 4 DAYS Performed at [facility], [place] [address] [place], [place] [address]    Report Status PENDING  Incomplete  Urine Culture     Status: Abnormal   Collection Time: 05/20/21  2:16 AM   Specimen: Urine, Clean Catch  Result Value Ref Range Status   Specimen Description URINE, CLEAN CATCH  Final   Special  Requests   Final    NONE Performed at [facility], [place] [address] [place], [place] [address]    Culture (A)  Final    >=100,000 COLONIES/mL ESCHERICHIA COLI Confirmed Extended Spectrum Beta-Lactamase Producer (ESBL).  In bloodstream infections from  ESBL organisms, carbapenems are preferred over piperacillin/tazobactam. They are shown to have a lower risk of mortality.    Report Status 05/21/2021 FINAL  Final   Organism ID, Bacteria ESCHERICHIA COLI (A)  Final      Susceptibility   Escherichia coli - MIC*    AMPICILLIN >=32 RESISTANT Resistant     CEFAZOLIN >=64 RESISTANT Resistant     CEFEPIME >=32 RESISTANT Resistant     CEFTRIAXONE >=64 RESISTANT Resistant     CIPROFLOXACIN >=4 RESISTANT Resistant     GENTAMICIN >=16 RESISTANT Resistant     IMIPENEM <=0.25 SENSITIVE Sensitive     NITROFURANTOIN <=16 SENSITIVE Sensitive     TRIMETH/SULFA >=320 RESISTANT Resistant     AMPICILLIN/SULBACTAM >=32 RESISTANT Resistant     PIP/TAZO <=4 SENSITIVE Sensitive     * >=100,000 COLONIES/mL ESCHERICHIA COLI       Radiology Studies: No results found.     LOS: 4 days   Cheresa Siers [employer] on [email or web address]  05/24/2021, 8:55 AM

## 2021-05-24 NOTE — Progress Notes (Signed)
[facility]  Date of Admission:  05/19/2021      Total days of antibiotics   Tecovirimat day 4  Meropenem day 5  Symtuza  Bactrim 3x weekly           ASSESSMENT: Jacob Gutierrez is a 35 y.o. male with uncontrolled and advanced HIV prescribed Symtuza once daily here with presumed human monkeypox infection and possibly UTI features. He has no urinary complaints presently. States he has been taking his ARVs since last hospitalization - VL down from 1.16 million to 18,500 indicating a level of adherence. Continue Bactrim 3x weekly for prophylaxis. One could add MAC proph also on presumption his immune reconstitution may be pretty slow. However he already has a hard time taking pills as it is.   Presumed Human Monkeypox Infection - continue tpoxx for treatment. Currently on day 4 now - no new lesions over the last day or so per his account. Proctitis symptoms may be slow to resolve (1-2 weeks). 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 Recommend to continue isolation for 3 weeks or until lesions crust over and re-epithelialize. Sexual precautions with condoms x 52madvised.   Stop meropenem after 5d - resolved urinary symptoms.    PLAN: Stop meropenem  Continue tpoxx for 14d - high fat meals 25 g prior to each dose to absorb.  Continue symtuza daily  Continue bactrim 3x weekly.     He has an appointment set up with GTerri Piedra11/04/2021.  Will sign off - please call with questions or changes in his condition.    Principal Problem:   Proctocolitis Active Problems:   AIDS (acquired immune deficiency syndrome) (HCC)   HTN (hypertension)   Human monkeypox   Oral lesion    Darunavir-Cobicistat-Emtricitabine-Tenofovir Alafenamide  1 tablet Oral Q breakfast   enoxaparin (LOVENOX) injection  40 mg Subcutaneous Q24H   feeding supplement  1 Container Oral BID WC   nystatin  5 mL Oral QID   pantoprazole  40 mg Oral BID    polyethylene glycol  17 g Oral Daily   potassium chloride  40 mEq Oral BID   sulfamethoxazole-trimethoprim  1 tablet Oral Q T,Th,Sat-1800   tamsulosin  0.4 mg Oral Daily   tecovirimat  600 mg Oral BID WC    SUBJECTIVE: Feels a little better. Proctitis symptoms getting a little better. No new lesions today that he has noticed   Review of Systems: Review of Systems  Constitutional:  Negative for chills, fever, malaise/fatigue and weight loss.  HENT:  Positive for sore throat.        No dental problems  Respiratory:  Negative for cough and sputum production.   Cardiovascular:  Negative for chest pain and leg swelling.  Gastrointestinal:  Positive for constipation. Negative for abdominal pain, diarrhea and vomiting.  Genitourinary:  Negative for dysuria and flank pain.  Musculoskeletal:  Negative for joint pain, myalgias and neck pain.  Skin:  Positive for rash.  Neurological:  Negative for dizziness, tingling and headaches.  Psychiatric/Behavioral:  Negative for depression and substance abuse. The patient is not nervous/anxious and does not have insomnia.     Allergies  Allergen Reactions   Vancomycin Anaphylaxis, Itching, Swelling and Other (See Comments)    Angioedema and "everything swells"   Amoxicillin Other (See Comments)    From childhood: "I had a reaction when i was little." (??) Has tolerated multiple cephalosporins  in the past    OBJECTIVE: Vitals:   05/23/21 1533 05/23/21 2047 05/24/21 0539 05/24/21 0857  BP: 111/68 115/69 112/84 129/87  Pulse: (!) 108 (!) 104 100 100  Resp: _0 Temp: 98.3 F (36.8 C) 98.3 F (36.8 C) 98.8 F (37.1 C) 99 F (37.2 C)  TempSrc: Axillary Oral Oral Oral  SpO2: 98% 100% 99% 99%  Weight:      Height:       Body mass index is 23.75 kg/m.  Physical Exam Vitals reviewed.  Constitutional:      General: He is not in acute distress.    Appearance: He is ill-appearing. He is not toxic-appearing.  HENT:     Nose: No  congestion.     Mouth/Throat:     Comments: Yellow plaques along tongue.  Eyes:     Pupils: Pupils are equal, round, and reactive to light.  Cardiovascular:     Rate and Rhythm: Regular rhythm. Tachycardia present.  Pulmonary:     Effort: Pulmonary effort is normal.     Breath sounds: Normal breath sounds.  Abdominal:     General: Bowel sounds are normal.     Palpations: Abdomen is soft.  Musculoskeletal:     Right lower leg: No edema.     Left lower leg: No edema.  Skin:    General: Skin is warm.     Capillary Refill: Capillary refill takes less than 2 seconds.     Findings: Rash present.     Comments: Newer appearing papules/pustules on nasal bridge and right cheek Other lesions continue to appear papuler/pustular and deeply seated.   Neurological:     Mental Status: He is oriented to person, place, and time.     Motor: No weakness.    Lab Results Lab Results  Component Value Date   WBC 5.5 05/22/2021   HGB 9.4 (L) 05/22/2021   HCT 28.0 (L) 05/22/2021   MCV 95.2 05/22/2021   PLT 113 (L) 05/22/2021    Lab Results  Component Value Date   CREATININE 0.98 05/24/2021   BUN 9 05/24/2021   NA 130 (L) 05/24/2021   K 3.2 (L) 05/24/2021   CL 105 05/24/2021   CO2 17 (L) 05/24/2021    Lab Results  Component Value Date   ALT 10 05/24/2021   AST 21 05/24/2021   ALKPHOS 59 05/24/2021   BILITOT 0.4 05/24/2021     Microbiology: Recent Results (from the past 240 hour(s))  Resp Panel by RT-PCR (Flu A&B, Covid) Nasopharyngeal Swab     Status: None   Collection Time: 05/19/21 10:53 PM   Specimen: Nasopharyngeal Swab; Nasopharyngeal(NP) swabs in vial transport medium  Result Value Ref Range Status   SARS Coronavirus 2 by RT PCR NEGATIVE NEGATIVE Final    Comment: (NOTE) SARS-CoV-2 target nucleic acids are NOT DETECTED.  The SARS-CoV-2 RNA is generally detectable in upper respiratory specimens during the acute phase of infection. The lowest concentration of SARS-CoV-2  viral copies this assay can detect is 138 copies/mL. A negative result does not preclude SARS-Cov-2 infection and should not be used as the sole basis for treatment or other patient management decisions. A negative result may occur with  improper specimen collection/handling, submission of specimen other than nasopharyngeal swab, presence of viral mutation(s) within the areas targeted by this assay, and inadequate number of viral copies(<138 copies/mL). A negative result must be combined with clinical observations, patient history, and epidemiological information. The expected result is Negative.  Fact Sheet for Patients:  EntrepreneurPulse.com.au  Fact Sheet for Healthcare Providers:  IncredibleEmployment.be  This test is no t yet approved or cleared by the [place] FDA and  has been authorized for detection and/or diagnosis of SARS-CoV-2 by FDA under an Emergency Use Authorization (EUA). This EUA will remain  in effect (meaning this test can be used) for the duration of the COVID-19 declaration under Section 564(b)(1) of the Act, 21 U.S.C.section 360bbb-3(b)(1), unless the authorization is terminated  or revoked sooner.       Influenza A by PCR NEGATIVE NEGATIVE Final   Influenza B by PCR NEGATIVE NEGATIVE Final    Comment: (NOTE) The Xpert Xpress SARS-CoV-2/FLU/RSV plus assay is intended as an aid in the diagnosis of influenza from Nasopharyngeal swab specimens and should not be used as a sole basis for treatment. Nasal washings and aspirates are unacceptable for Xpert Xpress SARS-CoV-2/FLU/RSV testing.  Fact Sheet for Patients: EntrepreneurPulse.com.au  Fact Sheet for Healthcare Providers: IncredibleEmployment.be  This test is not yet approved or cleared by the [place] FDA and has been authorized for detection and/or diagnosis of SARS-CoV-2 by FDA under an Emergency Use Authorization  (EUA). This EUA will remain in effect (meaning this test can be used) for the duration of the COVID-19 declaration under Section 564(b)(1) of the Act, 21 U.S.C. section 360bbb-3(b)(1), unless the authorization is terminated or revoked.  Performed at [facility], [place] [address] [place], [place] [address]   Blood culture (routine x 2)     Status: None (Preliminary result)   Collection Time: 05/20/21 12:00 AM   Specimen: BLOOD LEFT ARM  Result Value Ref Range Status   Specimen Description BLOOD LEFT ARM  Final   Special Requests   Final    BOTTLES DRAWN AEROBIC AND ANAEROBIC Blood Culture adequate volume   Culture   Final    NO GROWTH 4 DAYS Performed at [facility], [place] [address] [place], [place] [address]    Report Status PENDING  Incomplete  Blood culture (routine x 2)     Status: None (Preliminary result)   Collection Time: 05/20/21 12:13 AM   Specimen: BLOOD RIGHT ARM  Result Value Ref Range Status   Specimen Description BLOOD RIGHT ARM  Final   Special Requests   Final    BOTTLES DRAWN AEROBIC ONLY Blood Culture adequate volume   Culture   Final    NO GROWTH 4 DAYS Performed at [facility], [place] [address], [place], [place] [address]    Report Status PENDING  Incomplete  Urine Culture     Status: Abnormal   Collection Time: 05/20/21  2:16 AM   Specimen: Urine, Clean Catch  Result Value Ref Range Status   Specimen Description URINE, CLEAN CATCH  Final   Special Requests   Final    NONE Performed at [facility], [address], [place], [place] [address]    Culture (A)  Final    >=100,000 COLONIES/mL ESCHERICHIA COLI Confirmed Extended Spectrum Beta-Lactamase Producer (ESBL).  In bloodstream infections from ESBL organisms, carbapenems are preferred over piperacillin/tazobactam. They are shown to have a lower risk of mortality.    Report Status 05/21/2021 FINAL  Final   Organism ID, Bacteria ESCHERICHIA COLI (A)  Final      Susceptibility    Escherichia coli - MIC*    AMPICILLIN >=32 RESISTANT Resistant     CEFAZOLIN >=64 RESISTANT Resistant     CEFEPIME >=32 RESISTANT Resistant  CEFTRIAXONE >=64 RESISTANT Resistant     CIPROFLOXACIN >=4 RESISTANT Resistant     GENTAMICIN >=16 RESISTANT Resistant     IMIPENEM <=0.25 SENSITIVE Sensitive     NITROFURANTOIN <=16 SENSITIVE Sensitive     TRIMETH/SULFA >=320 RESISTANT Resistant     AMPICILLIN/SULBACTAM >=32 RESISTANT Resistant     PIP/TAZO <=4 SENSITIVE Sensitive     * >=100,000 COLONIES/mL ESCHERICHIA COLI    Janene Madeira, MSN, NP-C [facility]  [email or web address] Pager: [phone] Office: [place]: [phone]  11:41 AM

## 2021-05-25 ENCOUNTER — Other Ambulatory Visit (HOSPITAL_COMMUNITY): Payer: Self-pay

## 2021-05-25 DIAGNOSIS — B04 Monkeypox: Secondary | ICD-10-CM | POA: Diagnosis not present

## 2021-05-25 LAB — COMPREHENSIVE METABOLIC PANEL
ALT: 11 U/L (ref 0–44)
AST: 21 U/L (ref 15–41)
Albumin: 2.2 g/dL — ABNORMAL LOW (ref 3.5–5.0)
Alkaline Phosphatase: 58 U/L (ref 38–126)
Anion gap: 6 (ref 5–15)
BUN: 10 mg/dL (ref 6–20)
CO2: 21 mmol/L — ABNORMAL LOW (ref 22–32)
Calcium: 8.2 mg/dL — ABNORMAL LOW (ref 8.9–10.3)
Chloride: 103 mmol/L (ref 98–111)
Creatinine, Ser: 0.76 mg/dL (ref 0.61–1.24)
GFR, Estimated: >60 mL/min (ref >60)
Glucose, Bld: 89 mg/dL (ref 70–99)
Potassium: 3.7 mmol/L (ref 3.5–5.1)
Sodium: 130 mmol/L — ABNORMAL LOW (ref 135–145)
Total Bilirubin: 0.5 mg/dL (ref 0.3–1.2)
Total Protein: 7.9 g/dL (ref 6.5–8.1)

## 2021-05-25 LAB — CULTURE, BLOOD (ROUTINE X 2)
Culture: NO GROWTH
Culture: NO GROWTH
Special Requests: ADEQUATE
Special Requests: ADEQUATE

## 2021-05-25 LAB — CBC
HCT: 28.4 % — ABNORMAL LOW (ref 39.0–52.0)
Hemoglobin: 9.6 g/dL — ABNORMAL LOW (ref 13.0–17.0)
MCH: 31.9 pg (ref 26.0–34.0)
MCHC: 33.8 g/dL (ref 30.0–36.0)
MCV: 94.4 fL (ref 80.0–100.0)
Platelets: 154 10*3/uL (ref 150–400)
RBC: 3.01 MIL/uL — ABNORMAL LOW (ref 4.22–5.81)
RDW: 15.4 % (ref 11.5–15.5)
WBC: 8.9 10*3/uL (ref 4.0–10.5)
nRBC: 0 % (ref 0.0–0.2)

## 2021-05-25 LAB — T-HELPER CELLS (CD4) COUNT (NOT AT ARMC)
CD4 % Helper T Cell: 9 % — ABNORMAL LOW (ref 33–65)
CD4 T Cell Abs: 147 /uL — ABNORMAL LOW (ref 400–1790)

## 2021-05-25 MED ORDER — NYSTATIN 100000 UNIT/ML MT SUSP
5.0000 mL | Freq: Four times a day (QID) | OROMUCOSAL | 0 refills | Status: DC
  Filled 2021-05-25: qty 200, 10d supply, fill #0

## 2021-05-25 MED ORDER — OXYCODONE HCL 5 MG PO TABS: 5.0000 mg | ORAL_TABLET | Freq: Four times a day (QID) | ORAL | 0 refills | Status: DC | PRN

## 2021-05-25 MED ORDER — TAMSULOSIN HCL 0.4 MG PO CAPS
0.4000 mg | ORAL_CAPSULE | Freq: Every day | ORAL | 0 refills | Status: AC
  Filled 2021-05-25: qty 30, 30d supply, fill #0

## 2021-05-25 MED ORDER — TECOVIRIMAT 200 MG PO CAPS: 600.0000 mg | ORAL_CAPSULE | Freq: Two times a day (BID) | ORAL | 0 refills | Status: AC

## 2021-05-25 NOTE — Progress Notes (Signed)
Discharge instructions provided to patient. Patient verbalizes understanding. Medication from inpatient pharmacy received and given to patient. Iv removed, patient discharged

## 2021-05-25 NOTE — Discharge Summary (Signed)
[facility]  Physician Discharge Summary   Patient ID: Jacob ColumbiaDesmond J Gutierrez MRN: [id number] DOB/AGE: [birth year] 35 y.o.  Admit date: 05/19/2021 Discharge date:   05/25/2021   PCP: Patient, No Pcp Per (Inactive)  DISCHARGE DIAGNOSES:  Monkeypox causing diffuse papular rash along with oral and perianal lesions Proctocolitis HIV/AIDS Acute pyelonephritis with ESBL E. Coli Essential hypertension Iron deficiency anemia   RECOMMENDATIONS FOR OUTPATIENT FOLLOW UP: Follow-up with ID clinic Patient may need to be seen by urologist if his difficulty urinating does not subside   Home Health: None Equipment/Devices: None  CODE STATUS: Full code  DISCHARGE CONDITION: fair  Diet recommendation: As before  INITIAL HISTORY: 35 y.o. male with medical history significant of HIV/AIDS (CD4 count <35 on 04/29/2021), medication noncompliance, hypertension, substance abuse, history of gonorrhea, syphilis, pulmonary coccidioidomycosis.  Admitted in July 2022 for sepsis secondary to pyelonephritis secondary to ESBL E. coli.  Admitted again in August 2022 for sepsis secondary to group B strep bacteremia.  He presents to the ED complaining of rectal pain.  Febrile with temperature 100.4 F, slightly tachycardic.  Labs showing WBC 6.9, hemoglobin 10.7 (no significant change from baseline), platelet count 160k.  Sodium 130 (chronically low and at baseline), potassium 3.5, chloride 100, bicarb 22, BUN 12, creatinine 0.9, glucose 87.  UA with positive nitrite, large amount of leukocytes, greater than 50 WBCs, and many bacteria.  Urine and blood cultures pending.  Lactic acid normal x2.  INR 1.2.  COVID and influenza PCR negative.  CT abdomen pelvis showing findings consistent with proctocolitis; no drainable fluid collection or abscess.  Patient was hospitalized for further management.    Consultants: Infectious disease   HOSPITAL COURSE:   Monkeypox causing diffuse papular rash along with oral and  perianal lesions  He has diffuse papular rash over the skin.  He has oral lesions as well.  He also has lesions around his perianal area.   Monkeypox is high in the differential per infectious disease.  Patient has been started on tecovirimat by infectious disease.  Testing has been ordered and results are pending.   Symptoms have improved.  Will be discharged on tecovirimat to be arranged by ID since this remains an investigational drug.  Proctocolitis/acute pyelonephritis/ESBL E. coli infection/sepsis present on admission Patient was noted to be febrile, tachycardic at the time of admission. CT scan showed proctocolitis without any abscess.   CT scan also raised concern for pyelonephritis. Recent urine culture with ESBL E. coli.  Patient was initially placed on meropenem and Flagyl.  Flagyl was discontinued by ID. ESBL E. coli noted in the urine.  5-day treatment with meropenem has been recommended by infectious disease.  Seems to be stable.  Fever curve appears to have improved.  HIV/AIDS Patient also with history of gonorrhea and syphilis in the past.  His CD4 count has been less than 35 when recently checked.  He is on antiretroviral treatment and on Bactrim.  Apparently compliance has been an issue in the past.  Patient however mentioned that he has been compliant with his medications.   Difficulty with urination Patient mentions that he has been straining to pass urine.  No retention was noted on bladder scan.  Patient has had 2 CT scans of his abdomen pelvis done within the last few weeks.  Prostate noted to be normal in both of them.  One of the CT scan did show a moderate severity lobulated asymmetric predominantly posterior urinary bladder wall thickening.  Along with his acute infection  likely contributing to her symptoms.  No significant retention has been noted.  He will be discharged on Flomax.  He may need to be seen by urology in the outpatient setting if his symptoms do not  completely resolve in the next 1 to 2 weeks.  Stable   Essential hypertension Noted to be on nifedipine prior to admission.  This is currently on hold.  Blood pressure is reasonably well controlled.     Iron deficiency anemia Continue with iron supplements.  Drop in hemoglobin noted.  Stable this morning.    Hyponatremia/hypokalemia Hyponatremia is chronic.  Etiology is unclear but likely multifactorial.  Noted to be stable. Potassium was supplemented.  Bicarbonate level noted to be better today.     Pressure injury Stage II decubitus noted in the coccyx area Pressure Injury 03/04/21 Coccyx Mid Stage 2 -  Partial thickness loss of dermis presenting as a shallow open injury with a red, pink wound bed without slough. (Active)  03/04/21 2000  Location: Coccyx  Location Orientation: Mid  Staging: Stage 2 -  Partial thickness loss of dermis presenting as a shallow open injury with a red, pink wound bed without slough.  Wound Description (Comments):   Present on Admission: Yes    Patient is stable.  Tecovirimat to be arranged by infectious disease through pharmacy.  This is an investigational drug.  Okay for discharge today.  PERTINENT LABS:  The results of significant diagnostics from this hospitalization (including imaging, microbiology, ancillary and laboratory) are listed below for reference.    Microbiology: Recent Results (from the past 240 hour(s))  Resp Panel by RT-PCR (Flu A&B, Covid) Nasopharyngeal Swab     Status: None   Collection Time: 05/19/21 10:53 PM   Specimen: Nasopharyngeal Swab; Nasopharyngeal(NP) swabs in vial transport medium  Result Value Ref Range Status   SARS Coronavirus 2 by RT PCR NEGATIVE NEGATIVE Final    Comment: (NOTE) SARS-CoV-2 target nucleic acids are NOT DETECTED.  The SARS-CoV-2 RNA is generally detectable in upper respiratory specimens during the acute phase of infection. The lowest concentration of SARS-CoV-2 viral copies this assay can  detect is 138 copies/mL. A negative result does not preclude SARS-Cov-2 infection and should not be used as the sole basis for treatment or other patient management decisions. A negative result may occur with  improper specimen collection/handling, submission of specimen other than nasopharyngeal swab, presence of viral mutation(s) within the areas targeted by this assay, and inadequate number of viral copies(<138 copies/mL). A negative result must be combined with clinical observations, patient history, and epidemiological information. The expected result is Negative.  Fact Sheet for Patients:  EntrepreneurPulse.com.au  Fact Sheet for Healthcare Providers:  IncredibleEmployment.be  This test is no t yet approved or cleared by the [place] FDA and  has been authorized for detection and/or diagnosis of SARS-CoV-2 by FDA under an Emergency Use Authorization (EUA). This EUA will remain  in effect (meaning this test can be used) for the duration of the COVID-19 declaration under Section 564(b)(1) of the Act, 21 U.S.C.section 360bbb-3(b)(1), unless the authorization is terminated  or revoked sooner.       Influenza A by PCR NEGATIVE NEGATIVE Final   Influenza B by PCR NEGATIVE NEGATIVE Final    Comment: (NOTE) The Xpert Xpress SARS-CoV-2/FLU/RSV plus assay is intended as an aid in the diagnosis of influenza from Nasopharyngeal swab specimens and should not be used as a sole basis for treatment. Nasal washings and aspirates are unacceptable for Xpert Xpress  SARS-CoV-2/FLU/RSV testing.  Fact Sheet for Patients: EntrepreneurPulse.com.au  Fact Sheet for Healthcare Providers: IncredibleEmployment.be  This test is not yet approved or cleared by the [place] FDA and has been authorized for detection and/or diagnosis of SARS-CoV-2 by FDA under an Emergency Use Authorization (EUA). This EUA will remain in  effect (meaning this test can be used) for the duration of the COVID-19 declaration under Section 564(b)(1) of the Act, 21 U.S.C. section 360bbb-3(b)(1), unless the authorization is terminated or revoked.  Performed at [facility], [place] [address], [place], [place] [address]   Blood culture (routine x 2)     Status: None   Collection Time: 05/20/21 12:00 AM   Specimen: BLOOD LEFT ARM  Result Value Ref Range Status   Specimen Description BLOOD LEFT ARM  Final   Special Requests   Final    BOTTLES DRAWN AEROBIC AND ANAEROBIC Blood Culture adequate volume   Culture   Final    NO GROWTH 5 DAYS Performed at [facility], [place] [address] [place], [place] [address]    Report Status 05/25/2021 FINAL  Final  Blood culture (routine x 2)     Status: None   Collection Time: 05/20/21 12:13 AM   Specimen: BLOOD RIGHT ARM  Result Value Ref Range Status   Specimen Description BLOOD RIGHT ARM  Final   Special Requests   Final    BOTTLES DRAWN AEROBIC ONLY Blood Culture adequate volume   Culture   Final    NO GROWTH 5 DAYS Performed at [facility], [place] [address] [place], [place] [address]    Report Status 05/25/2021 FINAL  Final  Urine Culture     Status: Abnormal   Collection Time: 05/20/21  2:16 AM   Specimen: Urine, Clean Catch  Result Value Ref Range Status   Specimen Description URINE, CLEAN CATCH  Final   Special Requests   Final    NONE Performed at [facility], [place] [address] [place], [place] [address]    Culture (A)  Final    >=100,000 COLONIES/mL ESCHERICHIA COLI Confirmed Extended Spectrum Beta-Lactamase Producer (ESBL).  In bloodstream infections from ESBL organisms, carbapenems are preferred over piperacillin/tazobactam. They are shown to have a lower risk of mortality.    Report Status 05/21/2021 FINAL  Final   Organism ID, Bacteria ESCHERICHIA COLI (A)  Final      Susceptibility   Escherichia coli - MIC*    AMPICILLIN >=32 RESISTANT Resistant      CEFAZOLIN >=64 RESISTANT Resistant     CEFEPIME >=32 RESISTANT Resistant     CEFTRIAXONE >=64 RESISTANT Resistant     CIPROFLOXACIN >=4 RESISTANT Resistant     GENTAMICIN >=16 RESISTANT Resistant     IMIPENEM <=0.25 SENSITIVE Sensitive     NITROFURANTOIN <=16 SENSITIVE Sensitive     TRIMETH/SULFA >=320 RESISTANT Resistant     AMPICILLIN/SULBACTAM >=32 RESISTANT Resistant     PIP/TAZO <=4 SENSITIVE Sensitive     * >=100,000 COLONIES/mL ESCHERICHIA COLI     Labs:  COVID-19 Labs   Lab Results  Component Value Date   SARSCOV2NAA NEGATIVE 05/19/2021   SARSCOV2NAA NEGATIVE 03/03/2021   SARSCOV2NAA NEGATIVE 02/09/2021   SARSCOV2NAA POSITIVE (A) 08/07/2020      Basic Metabolic Panel: Recent Labs  Lab 05/21/21 0207 05/22/21 0020 05/22/21 0730 05/23/21 0842 05/24/21 0044 05/25/21 0056  NA 129* 128*  --  127* 130* 130*  K 3.6 3.4*  --  3.2* 3.2* 3.7  CL 99 100  --  99 105 103  CO2 24 22  --  19* 17* 21*  GLUCOSE 77 107*  --  104* 100* 89  BUN 13 14  --  7 9 10   CREATININE 1.19 1.05  --  0.91 0.98 0.76  CALCIUM 8.1* 7.9*  --  8.0* 8.1* 8.2*  MG  --   --  2.0 2.1  --   --    Liver Function Tests: Recent Labs  Lab 05/22/21 0020 05/23/21 0842 05/24/21 0044 05/25/21 0056  AST 22 22 21 21   ALT 12 10 10 11   ALKPHOS 60 61 59 58  BILITOT 0.3 0.4 0.4 0.5  PROT 7.4 8.1 7.9 7.9  ALBUMIN 2.3* 2.4* 2.4* 2.2*    CBC: Recent Labs  Lab 05/19/21 1533 05/20/21 0920 05/21/21 0207 05/22/21 0020 05/25/21 0056  WBC 6.9 6.0 5.7 5.5 8.9  NEUTROABS 4.1  --  3.4  --   --   HGB 10.7* 11.1* 11.0* 9.4* 9.6*  HCT 31.5* 33.7* 32.9* 28.0* 28.4*  MCV 94.9 96.3 96.5 95.2 94.4  PLT 160 146* 119* 113* 154     IMAGING STUDIES DG Abdomen 1 View  Result Date: 05/05/2021 CLINICAL DATA:  Patient has not used the bathroom in over a week. EXAM: ABDOMEN - 1 VIEW COMPARISON:  June 10, 2011 FINDINGS: Air is seen throughout the majority of the large bowel with an abrupt absence of  bowel gas noted within the mid to distal transverse colon. Stool is not clearly identified within this region. The visualized small bowel loops are unremarkable. No radio-opaque calculi or other significant radiographic abnormality are seen. IMPRESSION: Abrupt absence of bowel gas and stool within the transverse colon which is nonspecific in nature. Sequelae associated with marked severity colitis or fecal impaction cannot be excluded. Correlation with abdomen and pelvis CT is recommended. Electronically Signed   By: Virgina Norfolk M.D.   On: 05/05/2021 15:03   CT ABDOMEN PELVIS W CONTRAST  Result Date: 05/20/2021 CLINICAL DATA:  Abdominal pain and fever. EXAM: CT ABDOMEN AND PELVIS WITH CONTRAST TECHNIQUE: Multidetector CT imaging of the abdomen and pelvis was performed using the standard protocol following bolus administration of intravenous contrast. CONTRAST:  134mL OMNIPAQUE IOHEXOL 300 MG/ML  SOLN COMPARISON:  None. FINDINGS: Lower chest: The visualized lung bases are clear. No intra-abdominal free air.  Small free fluid in the pelvis. Hepatobiliary: No focal liver abnormality is seen. No gallstones, gallbladder wall thickening, or biliary dilatation. Pancreas: Unremarkable. No pancreatic ductal dilatation or surrounding inflammatory changes. Spleen: Normal in size without focal abnormality. Adrenals/Urinary Tract: The adrenal glands are unremarkable. There is heterogeneous enhancement of the right renal parenchyma which may be related to underlying parenchymal scarring. Correlation with urinalysis recommended to exclude pyelonephritis. No drainable fluid collection. There is no hydronephrosis on either side. The visualized ureters and urinary bladder appear unremarkable. Stomach/Bowel: Circumferential thickening and inflammatory changes of the rectosigmoid. There is no bowel obstruction. The appendix is normal. Vascular/Lymphatic: The abdominal aorta and IVC are unremarkable. No portal venous gas.  Retroperitoneal adenopathy as well as several mildly enlarged perirectal lymph nodes. Reproductive: The prostate and seminal vesicles are grossly unremarkable. Other: None Musculoskeletal: No acute or significant osseous findings. IMPRESSION: 1. Proctocolitis. Underlying malignancy is not excluded. Further evaluation with sigmoidoscopy after resolution of acute symptoms and inflammatory changes recommended. No drainable fluid collection or abscess. No bowel obstruction. Normal appendix. 2. Heterogeneous enhancement of the right renal parenchyma may be related to underlying parenchymal  scarring. Correlation with urinalysis recommended to exclude pyelonephritis. No drainable fluid collection. 3. Retroperitoneal and perirectal adenopathy. Electronically Signed   By: Anner Crete M.D.   On: 05/20/2021 02:59   CT ABDOMEN PELVIS W CONTRAST  Result Date: 05/05/2021 CLINICAL DATA:  Constipation x1 week. EXAM: CT ABDOMEN AND PELVIS WITH CONTRAST TECHNIQUE: Multidetector CT imaging of the abdomen and pelvis was performed using the standard protocol following bolus administration of intravenous contrast. CONTRAST:  35mL OMNIPAQUE IOHEXOL 350 MG/ML SOLN COMPARISON:  March 03, 2021 FINDINGS: Lower chest: No acute abnormality. Hepatobiliary: No focal liver abnormality is seen. No gallstones, gallbladder wall thickening, or biliary dilatation. Pancreas: Unremarkable. No pancreatic ductal dilatation or surrounding inflammatory changes. Spleen: Normal in size without focal abnormality. Adrenals/Urinary Tract: The bilateral adrenal glands are nodular in appearance. Kidneys are normal, without renal calculi, focal lesion, or hydronephrosis. Moderate severity lobulated asymmetric, predominately posterior urinary bladder wall thickening is seen. This represents a new finding when compared to the prior study. Stomach/Bowel: Stomach is within normal limits. Appendix appears normal. A mild stool burden is noted. No evidence of  bowel wall thickening, distention, or inflammatory changes. Vascular/Lymphatic: No significant vascular findings are present. Moderate severity para-aortic and aortocaval lymphadenopathy is seen. This is decreased in severity when compared to the prior study. The largest para-aortic lymph node measures 2.0 cm x 1.1 cm, while the largest aortocaval lymph node measures 1.8 cm x 1.1 cm. Reproductive: Prostate is unremarkable. Other: No abdominal wall hernia or abnormality. No abdominopelvic ascites. Musculoskeletal: No acute or significant osseous findings. IMPRESSION: 1. Posterior urinary bladder wall thickening, as described above, which may represent sequelae associated with cystitis. Sequelae associated with an underlying neoplastic process cannot be excluded. Correlation with urinalysis and subsequent a rounded consult is recommended. 2. Moderate severity para-aortic and aortocaval lymphadenopathy, decreased in severity when compared to the prior exam. 3. Nodular appearance of the bilateral adrenal glands. While this may represent adrenal hyperplasia, correlation with follow-up abdomen pelvis CT is recommended to exclude the presence of an underlying neoplastic process. 4. Mild stool burden without evidence of bowel obstruction. Electronically Signed   By: Virgina Norfolk M.D.   On: 05/05/2021 17:10   US Abdomen Limited RUQ (LIVER/GB)  Result Date: 05/05/2021 CLINICAL DATA:  Right upper quadrant pain for 1 month EXAM: ULTRASOUND ABDOMEN LIMITED RIGHT UPPER QUADRANT COMPARISON:  03/03/2021 FINDINGS: Gallbladder: Incompletely distended gallbladder. Gallbladder wall thickness is upper limits of normal at 2.9 mm. Sludge identified dependently. No stones, pericholecystic fluid or sonographic Murphy's sign. Common bile duct: Diameter: 2.7 mm Liver: No focal lesion identified. Within normal limits in parenchymal echogenicity. Portal vein is patent on color Doppler imaging with normal direction of blood flow  towards the liver. Other: None. IMPRESSION: Gallbladder sludge. No secondary signs of acute cholecystitis. Electronically Signed   By: Kerby Moors M.D.   On: 05/05/2021 11:37    DISCHARGE EXAMINATION: Vitals:   05/24/21 0857 05/24/21 2235 05/25/21 0327 05/25/21 0854  BP: 129/87 111/82 105/71 112/80  Pulse: 100 91 (!) 109 95  Resp: 20 18 18 18   Temp: 99 F (37.2 C) 98.3 F (36.8 C) 98.5 F (36.9 C) 98.1 F (36.7 C)  TempSrc: Oral Oral Oral Oral  SpO2: 99% 100% 99% 100%  Weight:      Height:       General appearance: Awake alert.  In no distress Resp: Clear to auscultation bilaterally.  Normal effort Cardio: S1-S2 is normal regular.  No S3-S4.  No rubs murmurs or bruit  GI: Abdomen is soft.  Nontender nondistended.  Bowel sounds are present normal.  No masses organomegaly    DISPOSITION: Home  Discharge Instructions     Call MD for:  difficulty breathing, headache or visual disturbances   Complete by: As directed    Call MD for:  extreme fatigue   Complete by: As directed    Call MD for:  persistant dizziness or light-headedness   Complete by: As directed    Call MD for:  persistant nausea and vomiting   Complete by: As directed    Call MD for:  severe uncontrolled pain   Complete by: As directed    Call MD for:  temperature >100.4   Complete by: As directed    Diet general   Complete by: As directed    Discharge instructions   Complete by: As directed    Please take your prescriptions as prescribed. Please avoid contact with other people till all lesions have cleared up.   You were cared for by a hospitalist during your hospital stay. If you have any questions about your discharge medications or the care you received while you were in the hospital after you are discharged, you can call the unit and asked to speak with the hospitalist on call if the hospitalist that took care of you is not available. Once you are discharged, your primary care physician will handle any  further medical issues. Please note that NO REFILLS for any discharge medications will be authorized once you are discharged, as it is imperative that you return to your primary care physician (or establish a relationship with a primary care physician if you do not have one) for your aftercare needs so that they can reassess your need for medications and monitor your lab values. If you do not have a primary care physician, you can call [phone] for a physician referral.   Increase activity slowly   Complete by: As directed          Allergies as of 05/25/2021       Reactions   Vancomycin Anaphylaxis, Itching, Swelling, Other (See Comments)   Angioedema and "everything swells"   Amoxicillin Other (See Comments)   From childhood: "I had a reaction when i was little." (??) Has tolerated multiple cephalosporins in the past        Medication List     STOP taking these medications    Creon 36000 UNITS Cpep capsule Generic drug: lipase/protease/amylase   fluconazole 200 MG tablet Commonly known as: DIFLUCAN   loperamide 2 MG tablet Commonly known as: Imodium A-D   NIFEdipine 60 MG 24 hr tablet Commonly known as: ADALAT CC   potassium chloride 10 MEQ tablet Commonly known as: KLOR-CON       TAKE these medications    acetaminophen 500 MG tablet Commonly known as: TYLENOL Take 1,000 mg by mouth every 6 (six) hours as needed for moderate pain or headache.   FeroSul 325 (65 FE) MG tablet Generic drug: ferrous sulfate TAKE 1 TABLET (325 MG TOTAL) BY MOUTH DAILY AFTER BREAKFAST.   nystatin 100000 UNIT/ML suspension Commonly known as: MYCOSTATIN Take 5 mLs (500,000 Units total) by mouth 4 (four) times daily.   oxyCODONE 5 MG immediate release tablet Commonly known as: Oxy IR/ROXICODONE Take 1 tablet (5 mg total) by mouth every 6 (six) hours as needed for moderate pain.   pantoprazole 40 MG tablet Commonly known as: PROTONIX TAKE 1 TABLET (40 MG TOTAL) BY MOUTH 2 (TWO)  TIMES DAILY.   polyethylene glycol 17 g packet Commonly known as: MiraLax Take 17 g by mouth daily.   sulfamethoxazole-trimethoprim 800-160 MG tablet Commonly known as: BACTRIM DS Take 1 tablet by mouth 3 (three) times a week. What changed:  when to take this additional instructions   Symtuza 800-150-200-10 MG Tabs Generic drug: Darunavir-Cobicistat-Emtricitabine-Tenofovir Alafenamide Take 1 tablet by mouth daily with breakfast.   tamsulosin 0.4 MG Caps capsule Commonly known as: FLOMAX Take 1 capsule (0.4 mg total) by mouth daily.   tecovirimat capsule Commonly known as: TPOXX Take 3 capsules (600 mg total) by mouth 2 (two) times daily with a meal for 14 days.          Follow-up Information     Veryl Speak, FNP Follow up on 06/03/2021.   Specialty: Infectious Diseases Contact information: [address] [place] [address] [phone]         [facility] Follow up.   Why: for continued problems with urination Contact information: [address] [place] [address] [place] [address] [phone]                TOTAL DISCHARGE TIME: 35 minutes  Marlia Schewe Rito Ehrlich  [facility] Pager on [email or web address]  05/25/2021, 12:53 PM

## 2021-05-25 NOTE — Plan of Care (Signed)
  Problem: Education: Goal: Knowledge of General Education information will improve Description: Including pain rating scale, medication(s)/side effects and non-pharmacologic comfort measures Outcome: Adequate for Discharge   

## 2021-05-27 ENCOUNTER — Other Ambulatory Visit (HOSPITAL_COMMUNITY): Payer: Self-pay

## 2021-05-27 LAB — MONKEYPOX VIRUS DNA, QUALITATIVE REAL-TIME PCR: Orthopoxvirus DNA, QL PCR: DETECTED — AB

## 2021-06-03 ENCOUNTER — Ambulatory Visit: Payer: Medicaid Other | Admitting: Family

## 2021-06-21 ENCOUNTER — Encounter (HOSPITAL_COMMUNITY): Payer: Self-pay | Admitting: Radiology

## 2021-06-21 ENCOUNTER — Telehealth: Payer: Self-pay

## 2021-06-21 NOTE — Telephone Encounter (Signed)
Called patient to get an appointment scheduled, voicemail stated a different name.. Did not leave a voicemail.

## 2021-06-23 ENCOUNTER — Ambulatory Visit (INDEPENDENT_AMBULATORY_CARE_PROVIDER_SITE_OTHER): Payer: Medicaid Other | Admitting: Family

## 2021-06-23 ENCOUNTER — Other Ambulatory Visit: Payer: Self-pay

## 2021-06-23 ENCOUNTER — Encounter: Payer: Self-pay | Admitting: Family

## 2021-06-23 VITALS — BP 93/67 | HR 92 | Resp 16 | Ht 73.0 in | Wt 187.0 lb

## 2021-06-23 DIAGNOSIS — Z0000 Encounter for general adult medical examination without abnormal findings: Secondary | ICD-10-CM | POA: Diagnosis present

## 2021-06-23 DIAGNOSIS — B04 Monkeypox: Secondary | ICD-10-CM | POA: Diagnosis not present

## 2021-06-23 DIAGNOSIS — B20 Human immunodeficiency virus [HIV] disease: Secondary | ICD-10-CM

## 2021-06-23 NOTE — Assessment & Plan Note (Signed)
Mr. Sunga has reported good adherence and tolerance to his ART regimen of Symtuza supplemented with Bactrim.  No signs/symptoms of opportunistic infection.  We reviewed previous lab work and discussed plan of care.  Reminded of importance of continuing to take medications daily as prescribed.  Continue current dose of Symtuza supplemented with Bactrim for OI prophylaxis.  Check blood work today.  Plan for follow-up in 1 month or sooner if needed with lab work on the same day.

## 2021-06-23 NOTE — Patient Instructions (Signed)
Nice to see you.  We will check your lab work today.  Continue to take your medication daily as prescribed.  Plan for follow up in 1 month or sooner if needed with lab work on the same day.  Have a great day and stay safe!

## 2021-06-23 NOTE — Progress Notes (Signed)
Brief Narrative   Patient ID: Jacob Gutierrez, male    DOB: November 20, 1985, 35 y.o.   MRN: [id number]  Jacob Gutierrez is a 35 year old African-American male diagnosed with HIV-1 disease in 2013 with risk factor of MSM.  Initial CD4 nadir of 60.  Previous history of gonorrhea, pulmonary cocciodomycosis.  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  Previous ART regimen include Stribild with Prezista and now Symtuza.  Subjective:    Chief Complaint  Patient presents with   Follow-up    B20-Condoms accepted     HPI:  Jacob Gutierrez is a 36 y.o. male with HIV/AIDS last seen on 04/29/2021 with poorly controlled virus and less than optimal adherence to his ART regimen of Symtuza.  Viral load was over 1 million with CD4 count less than 35.  In the interim he was hospitalized with concern for recurrent pyelonephritis and found to have Ortho poxvirus treated with TPOXX.  Here today for follow-up.  Jacob Gutierrez has been taking his Symtuza and Bactrim as prescribed with no missed doses since leaving the hospital.  He completed the course of TPOXX and all lesions have resolved with new skin formation and no residual effects.  Overall feeling well today with no new concerns/complaints. Denies fevers, chills, night sweats, headaches, changes in vision, neck pain/stiffness, nausea, diarrhea, vomiting, lesions or rashes.  Jacob Gutierrez has several months worth of medication secondary to less than optimal adherence previously.  Denies feelings of being down, depressed, or hopeless recently.  No current recreational or illicit drug use with occasional alcohol consumption and daily tobacco use.  as received his influenza vaccine.  Condoms offered.   Allergies  Allergen Reactions   Vancomycin Anaphylaxis, Itching, Swelling and Other (See Comments)    Angioedema and "everything swells"   Amoxicillin Other (See Comments)    From childhood: "I had a reaction when i was  little." (??) Has tolerated multiple cephalosporins in the past      Outpatient Medications Prior to Visit  Medication Sig Dispense Refill   acetaminophen (TYLENOL) 500 MG tablet Take 1,000 mg by mouth every 6 (six) hours as needed for moderate pain or headache.     Darunavir-Cobicistat-Emtricitabine-Tenofovir Alafenamide (SYMTUZA) 800-150-200-10 MG TABS Take 1 tablet by mouth daily with breakfast. 30 tablet 2   nystatin (MYCOSTATIN) 100000 UNIT/ML suspension Take 5 mLs (500,000 Units total) by mouth 4 (four) times daily. 200 mL 0   oxyCODONE (OXY IR/ROXICODONE) 5 MG immediate release tablet Take 1 tablet (5 mg total) by mouth every 6 (six) hours as needed for moderate pain. 15 tablet 0   sulfamethoxazole-trimethoprim (BACTRIM DS) 800-160 MG tablet Take 1 tablet by mouth 3 (three) times a week. 30 tablet 2   tamsulosin (FLOMAX) 0.4 MG CAPS capsule Take 1 capsule (0.4 mg total) by mouth daily. 30 capsule 0   ferrous sulfate 325 (65 FE) MG tablet TAKE 1 TABLET (325 MG TOTAL) BY MOUTH DAILY AFTER BREAKFAST. (Patient not taking: Reported on 03/30/2021) 30 tablet 0   pantoprazole (PROTONIX) 40 MG tablet TAKE 1 TABLET (40 MG TOTAL) BY MOUTH 2 (TWO) TIMES DAILY. (Patient not taking: Reported on 03/30/2021) 30 tablet 0   polyethylene glycol (MIRALAX) 17 g packet Take 17 g by mouth daily. (Patient not taking: Reported on 05/20/2021) 14 each 0   No facility-administered medications prior to visit.     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 [facility])    H/O coccidioidomycosis    pulmonary    HIV disease ([place])    Past history of allergy to penicillin-type antibiotic 07-03-2012   desensitization   PNA (pneumonia)    Shigella gastroenteritis    Syphilis    history /treated      Past Surgical History:  Procedure Laterality Date    BIOPSY  10/16/2020   Procedure: BIOPSY;  Surgeon: Irving Copas., MD;  Location: [facility] ENDOSCOPY;  Service: Gastroenterology;;   BIOPSY  10/23/2020   Procedure: BIOPSY;  Surgeon: Jackquline Denmark, MD;  Location: [facility] ENDOSCOPY;  Service: Endoscopy;;   COLONOSCOPY WITH PROPOFOL N/A 10/23/2020   Procedure: COLONOSCOPY WITH PROPOFOL;  Surgeon: Jackquline Denmark, MD;  Location: [facility] ENDOSCOPY;  Service: Endoscopy;  Laterality: N/A;   ESOPHAGOGASTRODUODENOSCOPY (EGD) WITH PROPOFOL N/A 10/16/2020   Procedure: ESOPHAGOGASTRODUODENOSCOPY (EGD) WITH PROPOFOL;  Surgeon: Rush Landmark Telford Nab., MD;  Location: [place];  Service: Gastroenterology;  Laterality: N/A;   TRANSURETHRAL RESECTION OF PROSTATE N/A 12/09/2017   Procedure: TRANSURETHRAL RESECTION OF THE PROSTATE (TURP);  Surgeon: Ardis Hughs, MD;  [facility]: [facility];  Service: Urology;  Laterality: N/A;      Review of Systems  Constitutional:  Negative for appetite change, chills, fatigue, fever and unexpected weight change.  Eyes:  Negative for visual disturbance.  Respiratory:  Negative for cough, chest tightness, shortness of breath and wheezing.   Cardiovascular:  Negative for chest pain and leg swelling.  Gastrointestinal:  Negative for abdominal pain, constipation, diarrhea, nausea and vomiting.  Genitourinary:  Negative for dysuria, flank pain, frequency, genital sores, hematuria and urgency.  Skin:  Negative for rash.  Allergic/Immunologic: Negative for immunocompromised state.  Neurological:  Negative for dizziness and headaches.     Objective:    BP 93/67   Pulse 92   Resp 16   Ht 6\' 1"  (1.854 m)   Wt 187 lb (84.8 kg)   SpO2 99%   BMI 24.67 kg/m  Nursing note and vital signs reviewed.  Physical Exam Constitutional:      General: He is not in acute distress.    Appearance: He is well-developed.  Eyes:     Conjunctiva/sclera: Conjunctivae normal.  Cardiovascular:     Rate and Rhythm: Normal rate and regular rhythm.      Heart sounds: Normal heart sounds. No murmur heard.   No friction rub. No gallop.  Pulmonary:     Effort: Pulmonary effort is normal. No respiratory distress.     Breath sounds: Normal breath sounds. No wheezing or rales.  Chest:     Chest wall: No tenderness.  Abdominal:     General: Bowel sounds are normal.     Palpations: Abdomen is soft.     Tenderness: There is no abdominal tenderness.  Musculoskeletal:     Cervical back: Neck supple.  Lymphadenopathy:     Cervical: No cervical adenopathy.  Skin:    General: Skin is warm and dry.     Findings: No rash.  Neurological:     Mental Status: He is alert and oriented to person, place, and time.  Psychiatric:        Behavior: Behavior normal.        Thought Content: Thought content normal.        Judgment: Judgment normal.     Depression screen [facility] 2/9 06/23/2021 04/29/2021 03/30/2021 10/15/2020 10/08/2020  Decreased Interest 0  0 0 0 0  Down, Depressed, Hopeless 0 0 0 1 0  PHQ - 2 Score 0 0 0 1 0  Altered sleeping - - - - 3  Tired, decreased energy - - - - 3  Change in appetite - - - - 0  Feeling bad or failure about yourself  - - - - 0  Trouble concentrating - - - - 0  Moving slowly or fidgety/restless - - - - 0  Suicidal thoughts - - - - 0  PHQ-9 Score - - - - 6       Assessment & Plan:    Patient Active Problem List   Diagnosis Date Noted   Human monkeypox    Oral lesion    Proctocolitis 05/20/2021   HTN (hypertension) 05/20/2021   Pressure injury of skin 03/05/2021   Group A streptococcal infection    Sepsis due to group A Streptococcus without acute organ dysfunction (HCC)    Sepsis due to Escherichia coli (E. coli) (HCC) 03/04/2021   Lactic acidosis 03/04/2021   GERD (gastroesophageal reflux disease) 03/04/2021   ESBL (extended spectrum beta-lactamase) producing bacteria infection    Obstructive uropathy    Prerenal renal failure    Nonadherence to medical treatment    Acute kidney injury ([place]) 02/10/2021    Loose stools    Bilious vomiting with nausea    SIRS (systemic inflammatory response syndrome) (HCC) 02/09/2021   Hypokalemia 10/15/2020   Nausea, vomiting, and diarrhea 10/15/2020   Colitis 10/15/2020   Dysphagia 10/15/2020   COVID-19 virus infection 08/25/2020   Dark urine 08/25/2020   Healthcare maintenance 07/23/2020   Pyelonephritis 02/13/2019   Prostatitis 10/05/2018   Leukopenia 09/20/2018   Hyponatremia 09/20/2018   Avoidance coping 09/07/2018   Fever 04/28/2018   Headache 04/27/2018   Condyloma 04/24/2018   Protein-calorie malnutrition, severe 12/12/2017   Personal history of MRSA (methicillin resistant Staphylococcus aureus) 12/09/2017   History of ESBL E. 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 Thorngren has reported good adherence and tolerance to his ART regimen of Symtuza supplemented with Bactrim.  No signs/symptoms of opportunistic infection.  We reviewed previous lab work and discussed plan of care.  Reminded of importance of continuing to take medications daily as prescribed.  Continue current dose of Symtuza supplemented with Bactrim for OI prophylaxis.  Check blood work today.  Plan for follow-up in 1 month or sooner if needed with lab work on the same day.      Relevant Orders   T-helper cell (CD4)- ([facility] clinic only)   HIV RNA, RTPCR W/R GT (RTI, PI,INT)   CBC   COMPLETE METABOLIC PANEL WITH GFR   Healthcare maintenance    Discussed importance of safe sexual practice and condom use.  Condoms offered. Discussed/recommended COVID booster.   Declined today.      Human monkeypox    Mr. Staiger completed the course of TPOXX with complete resolution of Ortho poxvirus lesions.  No further treatment necessary at this time.  I am having Lillie Columbia maintain his pantoprazole, ferrous sulfate, sulfamethoxazole-trimethoprim, Symtuza, polyethylene glycol, acetaminophen, oxyCODONE, nystatin, and tamsulosin.   Follow-up: Return in about 1 month (around 07/23/2021).   Marcos Eke, MSN, FNP-C Nurse Practitioner [facility] for Infectious Disease [facility] Main number: [phone]

## 2021-06-23 NOTE — Assessment & Plan Note (Signed)
Jacob Gutierrez completed the course of TPOXX with complete resolution of Ortho poxvirus lesions.  No further treatment necessary at this time.

## 2021-06-23 NOTE — Assessment & Plan Note (Signed)
   Discussed importance of safe sexual practice and condom use.  Condoms offered.  Discussed/recommended COVID booster.  Declined today.

## 2021-06-25 LAB — T-HELPER CELL (CD4) - (RCID CLINIC ONLY)
CD4 % Helper T Cell: 9 % — ABNORMAL LOW (ref 33–65)
CD4 T Cell Abs: 134 /uL — ABNORMAL LOW (ref 400–1790)

## 2021-07-05 LAB — CBC
HCT: 30.3 % — ABNORMAL LOW (ref 38.5–50.0)
Hemoglobin: 10.2 g/dL — ABNORMAL LOW (ref 13.2–17.1)
MCH: 32.3 pg (ref 27.0–33.0)
MCHC: 33.7 g/dL (ref 32.0–36.0)
MCV: 95.9 fL (ref 80.0–100.0)
MPV: 8.9 fL (ref 7.5–12.5)
Platelets: 296 10*3/uL (ref 140–400)
RBC: 3.16 10*6/uL — ABNORMAL LOW (ref 4.20–5.80)
RDW: 14.7 % (ref 11.0–15.0)
WBC: 6.3 10*3/uL (ref 3.8–10.8)

## 2021-07-05 LAB — HIV-1 INTEGRASE GENOTYPE

## 2021-07-05 LAB — COMPLETE METABOLIC PANEL WITH GFR
AG Ratio: 0.6 (calc) — ABNORMAL LOW (ref 1.0–2.5)
ALT: 31 U/L (ref 9–46)
AST: 30 U/L (ref 10–40)
Albumin: 3.4 g/dL — ABNORMAL LOW (ref 3.6–5.1)
Alkaline phosphatase (APISO): 254 U/L — ABNORMAL HIGH (ref 36–130)
BUN: 21 mg/dL (ref 7–25)
CO2: 21 mmol/L (ref 20–32)
Calcium: 8.8 mg/dL (ref 8.6–10.3)
Chloride: 104 mmol/L (ref 98–110)
Creat: 1.05 mg/dL (ref 0.60–1.26)
Globulin: 5.4 g/dL (calc) — ABNORMAL HIGH (ref 1.9–3.7)
Glucose, Bld: 88 mg/dL (ref 65–99)
Potassium: 4.1 mmol/L (ref 3.5–5.3)
Sodium: 135 mmol/L (ref 135–146)
Total Bilirubin: 0.3 mg/dL (ref 0.2–1.2)
Total Protein: 8.8 g/dL — ABNORMAL HIGH (ref 6.1–8.1)
eGFR: 94 mL/min/{1.73_m2} (ref >=60)

## 2021-07-05 LAB — HIV RNA, RTPCR W/R GT (RTI, PI,INT)
HIV 1 RNA Quant: 21900 copies/mL — ABNORMAL HIGH
HIV-1 RNA Quant, Log: 4.34 Log copies/mL — ABNORMAL HIGH

## 2021-07-05 LAB — HIV-1 GENOTYPE: HIV-1 Genotype: DETECTED — AB

## 2021-07-12 ENCOUNTER — Other Ambulatory Visit (HOSPITAL_COMMUNITY): Payer: Self-pay

## 2021-07-14 ENCOUNTER — Other Ambulatory Visit (HOSPITAL_COMMUNITY): Payer: Self-pay

## 2021-07-20 ENCOUNTER — Ambulatory Visit (HOSPITAL_COMMUNITY)
Admission: EM | Admit: 2021-07-20 | Discharge: 2021-07-20 | Disposition: A | Discharge status: Home / Self Care | Payer: Medicaid Other

## 2021-07-20 ENCOUNTER — Encounter (HOSPITAL_COMMUNITY): Payer: Self-pay

## 2021-07-20 ENCOUNTER — Other Ambulatory Visit: Payer: Self-pay
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
## 2021-07-22 ENCOUNTER — Other Ambulatory Visit (HOSPITAL_COMMUNITY): Payer: Self-pay

## 2021-07-23 ENCOUNTER — Telehealth: Payer: Self-pay

## 2021-07-23 NOTE — Telephone Encounter (Signed)
[facility] Patient Advocate Encounter ° °Patient's medications have been couriered to [facility] from [facility] and will be picked up 07/27/21. ° °Karanvir Balderston , CPhT °Specialty Pharmacy Patient Advocate °[facility] °Phone: [phone] °Fax:  [phone]  °

## 2021-07-27 ENCOUNTER — Encounter: Payer: Self-pay | Admitting: Family

## 2021-07-27 ENCOUNTER — Other Ambulatory Visit: Payer: Self-pay

## 2021-07-27 ENCOUNTER — Ambulatory Visit (INDEPENDENT_AMBULATORY_CARE_PROVIDER_SITE_OTHER): Payer: Medicaid Other | Admitting: Family

## 2021-07-27 VITALS — BP 104/73 | HR 101 | Temp 98.1°F | Wt 168.8 lb

## 2021-07-27 DIAGNOSIS — B20 Human immunodeficiency virus [HIV] disease: Secondary | ICD-10-CM

## 2021-07-27 DIAGNOSIS — Z0000 Encounter for general adult medical examination without abnormal findings: Secondary | ICD-10-CM

## 2021-07-27 DIAGNOSIS — Z23 Encounter for immunization: Secondary | ICD-10-CM

## 2021-07-27 NOTE — Assessment & Plan Note (Signed)
Jacob Gutierrez has improved adherence and good tolerance to his Symtuza missing 1 dose since his last office visit. No signs/symptoms of opportunistic infection. Discussed importance of taking medication daily as prescribed. Check blood work today. If viral load remains elevated will plan of adding Tivicay to Symtuza. He will continue the Bactrim for OI prophylaxis. Plan for follow up in 6 weeks or sooner if needed with lab work on the same day.

## 2021-07-27 NOTE — Progress Notes (Signed)
Brief Narrative   Patient ID: Jacob Gutierrez, male    DOB: [birth year], 36 y.o.   MRN: [id number]  Jacob Gutierrez is a 36 year old African-American male diagnosed with HIV-1 disease in 2013 with risk factor of MSM.  Initial CD4 nadir of 60.  Previous history of gonorrhea, pulmonary cocciodomycosis.  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  Previous ART regimen include Stribild with Prezista and now Symtuza.  Subjective:    Chief Complaint  Patient presents with   Follow-up    B20      HPI:  Jacob Gutierrez is a 36 y.o. male with HIV/AIDS last seen on 06/23/2021 with poorly controlled virus.  Viral load was 21,900 with CD4 count of 134.  Here today for routine follow-up.  Jacob Gutierrez has been taking his Symtuza and Bactrim daily as prescribed with 1 missed doses since his last office visit.  Overall feeling well today.  Hoping to get his dental work completed. Denies fevers, chills, night sweats, headaches, changes in vision, neck pain/stiffness, nausea, diarrhea, vomiting, lesions or rashes.  Jacob Gutierrez has no problems obtaining medication from the pharmacy and remains covered by Medicaid.  Denies feelings of being down, depressed, or hopeless recently.  No current recreational or illicit drug use, alcohol consumption and smokes approximately one third a pack of cigarettes per day.  Condoms offered.  Healthcare maintenance due includes influenza vaccination and COVID vaccines.  Allergies  Allergen Reactions   Vancomycin Anaphylaxis, Itching, Swelling and Other (See Comments)    Angioedema and "everything swells"   Amoxicillin Other (See Comments)    From childhood: "I had a reaction when i was little." (??) Has tolerated multiple cephalosporins in the past      Outpatient Medications Prior to Visit  Medication Sig Dispense Refill   acetaminophen (TYLENOL) 500 MG tablet Take 1,000 mg by mouth every 6 (six) hours as needed for  moderate pain or headache.     Darunavir-Cobicistat-Emtricitabine-Tenofovir Alafenamide (SYMTUZA) 800-150-200-10 MG TABS Take 1 tablet by mouth daily with breakfast. 30 tablet 2   sulfamethoxazole-trimethoprim (BACTRIM DS) 800-160 MG tablet Take 1 tablet by mouth 3 (three) times a week. 30 tablet 2   ferrous sulfate 325 (65 FE) MG tablet TAKE 1 TABLET (325 MG TOTAL) BY MOUTH DAILY AFTER BREAKFAST. (Patient not taking: Reported on 03/30/2021) 30 tablet 0   nystatin (MYCOSTATIN) 100000 UNIT/ML suspension Take 5 mLs (500,000 Units total) by mouth 4 (four) times daily. (Patient not taking: Reported on 07/27/2021) 200 mL 0   oxyCODONE (OXY IR/ROXICODONE) 5 MG immediate release tablet Take 1 tablet (5 mg total) by mouth every 6 (six) hours as needed for moderate pain. (Patient not taking: Reported on 07/27/2021) 15 tablet 0   pantoprazole (PROTONIX) 40 MG tablet TAKE 1 TABLET (40 MG TOTAL) BY MOUTH 2 (TWO) TIMES DAILY. (Patient not taking: Reported on 03/30/2021) 30 tablet 0   polyethylene glycol (MIRALAX) 17 g packet Take 17 g by mouth daily. (Patient not taking: Reported on 05/20/2021) 14 each 0   No facility-administered medications prior to visit.     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 [facility])    H/O coccidioidomycosis    pulmonary    HIV disease (HCC)    Past history of allergy to penicillin-type antibiotic 07-03-2012  desensitization   PNA (pneumonia)    Shigella gastroenteritis    Syphilis    history /treated      Past Surgical History:  Procedure Laterality Date   BIOPSY  10/16/2020   Procedure: BIOPSY;  Surgeon: Rush Landmark Telford Nab., MD;  Location: [place];  Service: Gastroenterology;;   BIOPSY  10/23/2020   Procedure: BIOPSY;  Surgeon: Jackquline Denmark, MD;  Location: [place];  Service: Endoscopy;;    COLONOSCOPY WITH PROPOFOL N/A 10/23/2020   Procedure: COLONOSCOPY WITH PROPOFOL;  Surgeon: Jackquline Denmark, MD;  Location: [facility] ENDOSCOPY;  Service: Endoscopy;  Laterality: N/A;   ESOPHAGOGASTRODUODENOSCOPY (EGD) WITH PROPOFOL N/A 10/16/2020   Procedure: ESOPHAGOGASTRODUODENOSCOPY (EGD) WITH PROPOFOL;  Surgeon: Rush Landmark Telford Nab., MD;  Location: [place];  Service: Gastroenterology;  Laterality: N/A;   TRANSURETHRAL RESECTION OF PROSTATE N/A 12/09/2017   Procedure: TRANSURETHRAL RESECTION OF THE PROSTATE (TURP);  Surgeon: Ardis Hughs, MD;  [facility];  Service: Urology;  Laterality: N/A;      Review of Systems  Constitutional:  Negative for appetite change, chills, fatigue, fever and unexpected weight change.  Eyes:  Negative for visual disturbance.  Respiratory:  Negative for cough, chest tightness, shortness of breath and wheezing.   Cardiovascular:  Negative for chest pain and leg swelling.  Gastrointestinal:  Negative for abdominal pain, constipation, diarrhea, nausea and vomiting.  Genitourinary:  Negative for dysuria, flank pain, frequency, genital sores, hematuria and urgency.  Skin:  Negative for rash.  Allergic/Immunologic: Negative for immunocompromised state.  Neurological:  Negative for dizziness and headaches.     Objective:    BP 104/73    Pulse (!) 101    Temp 98.1 F (36.7 C) (Oral)    Wt 168 lb 12.8 oz (76.6 kg)    SpO2 97%    BMI 22.27 kg/m  Nursing note and vital signs reviewed.  Physical Exam Constitutional:      General: He is not in acute distress.    Appearance: He is well-developed.  Eyes:     Conjunctiva/sclera: Conjunctivae normal.  Cardiovascular:     Rate and Rhythm: Normal rate and regular rhythm.     Heart sounds: Normal heart sounds. No murmur heard.   No friction rub. No gallop.  Pulmonary:     Effort: Pulmonary effort is normal. No respiratory distress.     Breath sounds: Normal breath sounds. No wheezing or rales.  Chest:      Chest wall: No tenderness.  Abdominal:     General: Bowel sounds are normal.     Palpations: Abdomen is soft.     Tenderness: There is no abdominal tenderness.  Musculoskeletal:     Cervical back: Neck supple.  Lymphadenopathy:     Cervical: No cervical adenopathy.  Skin:    General: Skin is warm and dry.     Findings: No rash.  Neurological:     Mental Status: He is alert and oriented to person, place, and time.  Psychiatric:        Behavior: Behavior normal.        Thought Content: Thought content normal.        Judgment: Judgment normal.     Depression screen [facility] 2/9 06/23/2021 04/29/2021 03/30/2021 10/15/2020 10/08/2020  Decreased Interest 0 0 0 0 0  Down, Depressed, Hopeless 0 0 0 1 0  PHQ - 2 Score 0 0 0 1 0  Altered sleeping - - - - 3  Tired, decreased energy - - - - 3  Change  in appetite - - - - 0  Feeling bad or failure about yourself  - - - - 0  Trouble concentrating - - - - 0  Moving slowly or fidgety/restless - - - - 0  Suicidal thoughts - - - - 0  PHQ-9 Score - - - - 6       Assessment & Plan:    Patient Active Problem List   Diagnosis Date Noted   Human monkeypox    Oral lesion    Proctocolitis 05/20/2021   HTN (hypertension) 05/20/2021   Pressure injury of skin 03/05/2021   Group A streptococcal infection    Sepsis due to group A Streptococcus without acute organ dysfunction ([place])    Sepsis due to Escherichia coli (E. coli) ([place]) 03/04/2021   Lactic acidosis 03/04/2021   GERD (gastroesophageal reflux disease) 03/04/2021   ESBL (extended spectrum beta-lactamase) producing bacteria infection    Obstructive uropathy    Prerenal renal failure    Nonadherence to medical treatment    Acute kidney injury ([place]) 02/10/2021   Loose stools    Bilious vomiting with nausea    SIRS (systemic inflammatory response syndrome) ([place]) 02/09/2021   Hypokalemia 10/15/2020   Nausea, vomiting, and diarrhea 10/15/2020   Colitis 10/15/2020   Dysphagia 10/15/2020    COVID-19 virus infection 08/25/2020   Dark urine 08/25/2020   Healthcare maintenance 07/23/2020   Pyelonephritis 02/13/2019   Prostatitis 10/05/2018   Leukopenia 09/20/2018   Hyponatremia 09/20/2018   Avoidance coping 09/07/2018   Fever 04/28/2018   Headache 04/27/2018   Condyloma 04/24/2018   Protein-calorie malnutrition, severe 12/12/2017   Personal history of MRSA (methicillin resistant Staphylococcus aureus) 12/09/2017   History of ESBL E. 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 Riquelme has improved adherence and good tolerance to his Symtuza missing 1 dose since his last office visit. No signs/symptoms of opportunistic infection. Discussed importance of taking medication daily as prescribed. Check blood work today. If viral load remains elevated will plan of adding Tivicay to [place]. He will continue the Bactrim for OI prophylaxis. Plan for follow up in 6 weeks or sooner if needed with lab work on the same day.       Relevant Orders   HIV 1 RNA quant-no reflex-bld   T-helper cell (CD4)- ([facility] clinic only)   Healthcare maintenance    Discussed importance of safe sexual practice and condom usage. Condoms offered.  Influenza vaccination updated.  Discussed/recommended Covid vaccination for primary series through pharmacy.  Will schedule dental care as he is attempting to get dentures.       Other Visit Diagnoses     Need for immunization against influenza       Relevant Orders   Flu Vaccine QUAD 31mo+IM (Fluarix, Fluzone & Alfiuria Quad PF) (Completed)         I have discontinued Silverio Decamp. Opiela's pantoprazole, ferrous sulfate, polyethylene glycol, oxyCODONE, and nystatin. I am also having him maintain his sulfamethoxazole-trimethoprim, Symtuza, and acetaminophen.   Follow-up: Return in about 6 weeks (around 09/07/2021).  [name], MSN, FNP-C Nurse Practitioner [facility] for Infectious Disease [place] number: [phone]

## 2021-07-27 NOTE — Assessment & Plan Note (Signed)
·   Discussed importance of safe sexual practice and condom usage. Condoms offered.   Influenza vaccination updated.   Discussed/recommended Covid vaccination for primary series through pharmacy.   Will schedule dental care as he is attempting to get dentures.

## 2021-07-27 NOTE — Patient Instructions (Addendum)
Nice to see you.  We will check your lab work today.  Continue to take your medication daily as prescribed.  Recommend scheduling with any pharmacy for initial COVID series with Moderna or [employer] vaccines.   Plan for follow up in 6 weeks or sooner if needed with lab work on the same day.  Have a great day and stay safe!

## 2021-07-28 LAB — T-HELPER CELL (CD4) - (RCID CLINIC ONLY)
CD4 % Helper T Cell: 8 % — ABNORMAL LOW (ref 33–65)
CD4 T Cell Abs: 115 /uL — ABNORMAL LOW (ref 400–1790)

## 2021-07-29 LAB — HIV-1 RNA QUANT-NO REFLEX-BLD
HIV 1 RNA Quant: 12400 Copies/mL — ABNORMAL HIGH
HIV-1 RNA Quant, Log: 4.09 Log cps/mL — ABNORMAL HIGH

## 2021-07-30 ENCOUNTER — Other Ambulatory Visit (HOSPITAL_COMMUNITY): Payer: Self-pay

## 2021-07-30 MED ORDER — TIVICAY 50 MG PO TABS
50.0000 mg | ORAL_TABLET | Freq: Every day | ORAL | 3 refills | Status: DC
  Filled 2021-07-30 (×2): qty 30, 30d supply, fill #0

## 2021-07-30 NOTE — Addendum Note (Signed)
Addended by: Jeanine Luz D on: 07/30/2021 09:42 AM   Modules accepted: Orders

## 2021-08-02 ENCOUNTER — Telehealth: Payer: Self-pay

## 2021-08-02 NOTE — Telephone Encounter (Signed)
[facility] Patient Advocate Encounter ° °Patient's medications have been couriered to [facility] from [facility] and will be picked up . ° °Pricella Gaugh , CPhT °Specialty Pharmacy Patient Advocate °[facility] °Phone: [phone] °Fax:  [phone]  °

## 2021-09-13 ENCOUNTER — Ambulatory Visit: Payer: Self-pay | Admitting: Family

## 2021-09-14 ENCOUNTER — Other Ambulatory Visit (HOSPITAL_COMMUNITY): Payer: Self-pay

## 2021-09-15 ENCOUNTER — Other Ambulatory Visit (HOSPITAL_COMMUNITY): Payer: Self-pay

## 2021-10-06 ENCOUNTER — Telehealth: Payer: Self-pay

## 2021-10-06 NOTE — Telephone Encounter (Signed)
Called patient to reschedule missed appointment, line rings busy.  ? ?Sandie Ano, RN ? ?

## 2021-11-01 NOTE — Telephone Encounter (Signed)
Working viral load list. Patient agrees to schedule follow up visit. Denies any currently barriers for keeping scheduled appointment.  ?Jacob Gutierrez ? ?

## 2021-11-08 ENCOUNTER — Encounter: Payer: Medicaid Other | Admitting: Family

## 2021-11-10 ENCOUNTER — Telehealth: Payer: Self-pay

## 2021-11-10 ENCOUNTER — Ambulatory Visit: Payer: Medicaid Other | Admitting: Family

## 2021-11-10 NOTE — Telephone Encounter (Signed)
Called patient regarding missed appointment with [place] today. States that he over slept and would need to reschedule for sometime next week. Is scheduled to come in on 4/24 at 1:45.  ?Understands that if he misses this appointment he will need to reschedule with pharmacy team. Has canceled/ no showed 3 times since last visit 07/27/21. ?Juanita Laster, RMA ? ?

## 2021-11-15 ENCOUNTER — Other Ambulatory Visit: Payer: Self-pay

## 2021-11-15 ENCOUNTER — Encounter: Payer: Self-pay | Admitting: Family

## 2021-11-15 ENCOUNTER — Ambulatory Visit (INDEPENDENT_AMBULATORY_CARE_PROVIDER_SITE_OTHER): Payer: Medicare Other | Admitting: Family

## 2021-11-15 VITALS — BP 124/85 | HR 100 | Temp 98.3°F | Ht 73.0 in | Wt 197.0 lb

## 2021-11-15 DIAGNOSIS — K5900 Constipation, unspecified: Secondary | ICD-10-CM | POA: Diagnosis not present

## 2021-11-15 DIAGNOSIS — R300 Dysuria: Secondary | ICD-10-CM | POA: Diagnosis not present

## 2021-11-15 DIAGNOSIS — B20 Human immunodeficiency virus [HIV] disease: Secondary | ICD-10-CM | POA: Diagnosis not present

## 2021-11-15 DIAGNOSIS — R1032 Left lower quadrant pain: Secondary | ICD-10-CM

## 2021-11-15 DIAGNOSIS — R1030 Lower abdominal pain, unspecified: Secondary | ICD-10-CM | POA: Insufficient documentation

## 2021-11-15 MED ORDER — POLYETHYLENE GLYCOL 3350 17 G PO PACK: 17.0000 g | PACK | Freq: Every day | ORAL | 1 refills | Status: DC

## 2021-11-15 MED ORDER — LEVOFLOXACIN 500 MG PO TABS: 500.0000 mg | ORAL_TABLET | Freq: Every day | ORAL | 0 refills | Status: AC

## 2021-11-15 NOTE — Assessment & Plan Note (Signed)
Mr. Cowans does not appear to have any evidence of UTI at present. Will check UA if able to provide urinary sample.  ?

## 2021-11-15 NOTE — Assessment & Plan Note (Signed)
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 Has good bowel sounds with no masses or lesions on exam. Will treat for constipation with Miralax and Colace as first line and obtain CT abdomen/pelvis with contrast given his extensive history cannot rule out malignancy or infection at this point. Will also start levaquin. Does not appear to have UTI with current symptoms as he previously had ESBL E. Coli. Advised to seek emergency care if symptoms worsen or do not improve with treatments. Follow up in 1 month or sooner if needed.  ?

## 2021-11-15 NOTE — Patient Instructions (Signed)
Nice to see you. ? ?We will check your lab work today. ? ?Continue to take your medication daily as prescribed. ? ?Refills have been sent to the pharmacy. ? ?They will call regarding your CT scan. ? ?Use the miralax and can use docusate sodium (Colace) for constipation ? ?Plan for follow up in 1 months or sooner if needed with lab work on the same day. ? ? ?

## 2021-11-15 NOTE — Assessment & Plan Note (Signed)
Mr. Pollio has good tolerance to his Symtuza and reported good adherence despite not having picked up medication in several months. Suspect he remains poorly controlled. We reviewed previous lab work and discussed plan of care. Remains at risk for opportunistic infection and will need to continue with Bactrim. Continue current dose of Tivicay and Symtuza. Check lab work today. Delivered medications provided. Plan for follow up in 1 month or sooner if needed.  ?

## 2021-11-15 NOTE — Progress Notes (Signed)
? ? ?Brief Narrative  ? ?Patient ID: Jacob Gutierrez, male    DOB: July 23, 1986, 36 y.o.   MRN: [id number] ? ?[name] is a 36 year-old African-American male diagnosed with HIV-1 disease in 2013 with risk factor of MSM.  Initial CD4 nadir of 60.  Previous history of gonorrhea, pulmonary cocciodomycosis.  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  Previous ART regimen include Stribild with Prezista and now Symtuza. ? ?Subjective:  ?  ?Chief Complaint  ?Patient presents with  ? Follow-up  ? ? ?HPI: ? ?MICHAELPAUL Gutierrez is a 36 y.o. 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 Viral load was 12,400 and CD4 count of 115. He has missed several appointments since that office visit and is here today for follow up.  ? ?[name] has been taking his Symtuza as prescribed and ran out of the Bactrim about 1 week ago. Did not previously start taking Tivicay. Feeling poorly today with acute onset difficulties with urination and defecation that have been going on for about 1 week. Has dysuria with lower abdominal pain and denies fevers, chills, urgency, or back pain. Defecation issues started about 1 week ago with stools being difficult to pass and described as hard and clump like with occasional mucus Has noted rectal bleeding at times. No recent sexual activity. Attempted to treat with a friends unknown medication that did not help very much. Eating and drinking adequately. Has also had leg swelling. Bilaterally. Denies fevers, chills, night sweats, headaches, changes in vision, neck pain/stiffness, nausea, diarrhea, vomiting, lesions or rashes. ? ?Mr. [name] receives his medications from the pharmacy and sent to the [facility]. Denies feelings of being down, depressed or hopeless recently. Continues to use marijuana socially and drink alcohol on occasion and daily tobacco use. Condoms offered.   ? ?Allergies  ?Allergen Reactions  ? Vancomycin Anaphylaxis, Itching, Swelling and Other (See Comments)  ?  Angioedema and "everything swells"  ? Amoxicillin Other (See Comments)  ?  From childhood: "I had a reaction when i was little." (??) ?Has tolerated multiple cephalosporins in the past  ? ? ? ? ?Outpatient Medications Prior to Visit  ?Medication Sig Dispense Refill  ? acetaminophen (TYLENOL) 500 MG tablet Take 1,000 mg by mouth every 6 (six) hours as needed for moderate pain or headache.    ? Darunavir-Cobicistat-Emtricitabine-Tenofovir Alafenamide (SYMTUZA) 800-150-200-10 MG TABS Take 1 tablet by mouth daily with breakfast. 30 tablet 2  ? dolutegravir (TIVICAY) 50 MG tablet Take 1 tablet (50 mg total) by mouth daily. 30 tablet 3  ? sulfamethoxazole-trimethoprim (BACTRIM DS) 800-160 MG tablet Take 1 tablet by mouth 3 (three) times a week. (Patient not taking: Reported on 11/15/2021) 30 tablet 2  ? ?No facility-administered medications prior to visit.  ? ? ? ?Past Medical History:  ?Diagnosis Date  ? AIDS (acquired immune deficiency syndrome) ([place])   ? AIDS (acquired immune deficiency syndrome) ([place])   ? 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 H/O coccidioidomycosis   ? pulmonary   ? HIV disease ([place])   ? Past history of allergy to penicillin-type antibiotic 07-03-2012  ? desensitization  ? PNA (pneumonia)   ? Shigella gastroenteritis   ? Syphilis   ? history /treated   ? ? ? ?Past Surgical History:  ?Procedure Laterality Date  ? BIOPSY  10/16/2020  ? Procedure: BIOPSY;  Surgeon: Irving Copas., MD;  Location: [employer];  Service: Gastroenterology;;  ? BIOPSY  10/23/2020  ? Procedure: BIOPSY;  Surgeon: Jackquline Denmark, MD;  Location: [facility] ENDOSCOPY;  Service: Endoscopy;;  ? COLONOSCOPY WITH PROPOFOL N/A 10/23/2020  ? Procedure: COLONOSCOPY WITH PROPOFOL;  Surgeon: Jackquline Denmark, MD;  Location: [facility] ENDOSCOPY;  Service: Endoscopy;  Laterality: N/A;  ?  ESOPHAGOGASTRODUODENOSCOPY (EGD) WITH PROPOFOL N/A 10/16/2020  ? Procedure: ESOPHAGOGASTRODUODENOSCOPY (EGD) WITH PROPOFOL;  Surgeon: Rush Landmark Telford Nab., MD;  Location: [place];  Service: Gastroenterology;  Laterality: N/A;  ? TRANSURETHRAL RESECTION OF PROSTATE N/A 12/09/2017  ? Procedure: TRANSURETHRAL RESECTION OF THE PROSTATE (TURP);  Surgeon: Ardis Hughs, MD;  Location: [facility];  Service: Urology;  Laterality: N/A;  ? ? ? ? ?Review of Systems  ?Constitutional:  Negative for appetite change, chills, fatigue, fever and unexpected weight change.  ?Eyes:  Negative for visual disturbance.  ?Respiratory:  Negative for cough, chest tightness, shortness of breath and wheezing.   ?Cardiovascular:  Negative for chest pain and leg swelling.  ?Gastrointestinal:  Positive for abdominal pain, anal bleeding and constipation. Negative for diarrhea, nausea and vomiting.  ?Genitourinary:  Positive for dysuria. Negative for flank pain, frequency, genital sores, hematuria and urgency.  ?Skin:  Negative for rash.  ?Allergic/Immunologic: Negative for immunocompromised state.  ?Neurological:  Negative for dizziness and headaches.  ?   ?Objective:  ?  ?BP 124/85   Pulse 100   Temp 98.3 ?F (36.8 ?C) (Oral)   Ht 6\' 1"  (1.854 m)   Wt 197 lb (89.4 kg)   SpO2 98%   BMI 25.99 kg/m?  ?Nursing note and vital signs reviewed. ? ?Physical Exam ?Constitutional:   ?   General: He is not in acute distress. ?   Appearance: He is well-developed. He is ill-appearing.  ?Cardiovascular:  ?   Rate and Rhythm: Regular rhythm. Tachycardia present.  ?   Heart sounds: Normal heart sounds.  ?Pulmonary:  ?   Effort: Pulmonary effort is normal.  ?   Breath sounds: Normal breath sounds.  ?Abdominal:  ?   General: Abdomen is flat. Bowel sounds are normal. There is distension.  ?   Palpations: Abdomen is soft. There is no mass.  ?   Tenderness: There is abdominal tenderness. There is no guarding or rebound.  ?   Hernia: No hernia is present.   ?Skin: ?   General: Skin is warm and dry.  ?Neurological:  ?   Mental Status: He is alert and oriented to person, place, and time.  ? ? ? ? ?  11/15/2021  ?  1:47 PM 06/23/2021  ?  1:59 PM 04/29/2021  ? 11:51 AM 03/30/2021  ?  1:42 PM 10/15/2020  ?  9:47 AM  ?Depression screen PHQ 2/9  ?Decreased Interest 0 0 0 0 0  ?Down, Depressed, Hopeless 0 0 0 0 1  ?PHQ - 2 Score 0 0 0 0 1  ?  ?   ?Assessment & Plan:  ? ? ?Patient Active Problem List  ? Diagnosis Date Noted  ? Lower abdominal pain 11/15/2021  ? Dysuria 11/15/2021  ? Human monkeypox   ? Oral lesion   ? Proctocolitis 05/20/2021  ? HTN (hypertension) 05/20/2021  ? Pressure injury of skin 03/05/2021  ? Group A streptococcal infection   ? Sepsis due to group A Streptococcus without acute organ dysfunction ([place])   ? Sepsis due to Escherichia coli (E. coli) ([place]) 03/04/2021  ? Lactic acidosis 03/04/2021  ? GERD (  gastroesophageal reflux disease) 03/04/2021  ? ESBL (extended spectrum beta-lactamase) producing bacteria infection   ? Obstructive uropathy   ? Prerenal renal failure   ? Nonadherence to medical treatment   ? Acute kidney injury ([place]) 02/10/2021  ? Loose stools   ? Bilious vomiting with nausea   ? SIRS (systemic inflammatory response syndrome) ([place]) 02/09/2021  ? Hypokalemia 10/15/2020  ? Nausea, vomiting, and diarrhea 10/15/2020  ? Colitis 10/15/2020  ? Dysphagia 10/15/2020  ? COVID-19 virus infection 08/25/2020  ? Dark urine 08/25/2020  ? Healthcare maintenance 07/23/2020  ? Pyelonephritis 02/13/2019  ? Prostatitis 10/05/2018  ? Leukopenia 09/20/2018  ? Hyponatremia 09/20/2018  ? Avoidance coping 09/07/2018  ? Fever 04/28/2018  ? Headache 04/27/2018  ? Condyloma 04/24/2018  ? Protein-calorie malnutrition, severe 12/12/2017  ? Personal history of MRSA (methicillin resistant Staphylococcus aureus) 12/09/2017  ? History of ESBL E. coli infection 12/09/2017  ? Thrush 12/09/2017  ? Diarrhea 03/05/2017  ? AIDS (acquired immune deficiency syndrome) ([place])   ?  Homelessness   ? 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 Syphilis, unspecified 09/12/2016  ? 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 Tobacco use disorder 01/02/2014  ? Human immunodeficiency virus (HIV) disease ([place]) 01/01/2014  ? ? ? ?Problem List Items Ad

## 2021-11-17 LAB — HELPER T-LYMPH-CD4 (ARMC ONLY)
% CD 4 Pos. Lymph.: 9.5 % — ABNORMAL LOW (ref 30.8–58.5)
Absolute CD 4 Helper: 143 /uL — ABNORMAL LOW (ref 359–1519)
Basophils Absolute: 0.1 10*3/uL (ref 0.0–0.2)
Basos: 1 %
EOS (ABSOLUTE): 0.2 10*3/uL (ref 0.0–0.4)
Eos: 2 %
Hematocrit: 38.4 % (ref 37.5–51.0)
Hemoglobin: 12.7 g/dL — ABNORMAL LOW (ref 13.0–17.7)
Immature Grans (Abs): 0.1 10*3/uL (ref 0.0–0.1)
Immature Granulocytes: 1 %
Lymphocytes Absolute: 1.5 10*3/uL (ref 0.7–3.1)
Lymphs: 19 %
MCH: 31.1 pg (ref 26.6–33.0)
MCHC: 33.1 g/dL (ref 31.5–35.7)
MCV: 94 fL (ref 79–97)
Monocytes Absolute: 0.6 10*3/uL (ref 0.1–0.9)
Monocytes: 8 %
Neutrophils Absolute: 5.3 10*3/uL (ref 1.4–7.0)
Neutrophils: 69 %
Platelets: 285 10*3/uL (ref 150–450)
RBC: 4.08 x10E6/uL — ABNORMAL LOW (ref 4.14–5.80)
RDW: 13.5 % (ref 11.6–15.4)
WBC: 7.6 10*3/uL (ref 3.4–10.8)

## 2021-11-19 LAB — COMPREHENSIVE METABOLIC PANEL
AG Ratio: 0.7 (calc) — ABNORMAL LOW (ref 1.0–2.5)
ALT: 7 U/L — ABNORMAL LOW (ref 9–46)
AST: 13 U/L (ref 10–40)
Albumin: 3.5 g/dL — ABNORMAL LOW (ref 3.6–5.1)
Alkaline phosphatase (APISO): 83 U/L (ref 36–130)
BUN: 12 mg/dL (ref 7–25)
CO2: 26 mmol/L (ref 20–32)
Calcium: 8.8 mg/dL (ref 8.6–10.3)
Chloride: 96 mmol/L — ABNORMAL LOW (ref 98–110)
Creat: 0.98 mg/dL (ref 0.60–1.26)
Globulin: 5.2 g/dL (calc) — ABNORMAL HIGH (ref 1.9–3.7)
Glucose, Bld: 81 mg/dL (ref 65–99)
Potassium: 3.5 mmol/L (ref 3.5–5.3)
Sodium: 131 mmol/L — ABNORMAL LOW (ref 135–146)
Total Bilirubin: 0.5 mg/dL (ref 0.2–1.2)
Total Protein: 8.7 g/dL — ABNORMAL HIGH (ref 6.1–8.1)

## 2021-11-19 LAB — HIV-1 RNA QUANT-NO REFLEX-BLD
HIV 1 RNA Quant: 7010 copies/mL — ABNORMAL HIGH
HIV-1 RNA Quant, Log: 3.85 Log copies/mL — ABNORMAL HIGH

## 2021-11-19 LAB — T-HELPER CELL (CD4) - (RCID CLINIC ONLY)

## 2021-11-22 ENCOUNTER — Other Ambulatory Visit (HOSPITAL_COMMUNITY): Payer: Self-pay

## 2021-12-08 ENCOUNTER — Ambulatory Visit
Admission: RE | Admit: 2021-12-08 | Discharge: 2021-12-08 | Disposition: A | Discharge status: Home / Self Care | Payer: Medicare Other | Source: Ambulatory Visit | Attending: Family | Admitting: Family

## 2021-12-08 DIAGNOSIS — R1032 Left lower quadrant pain: Secondary | ICD-10-CM

## 2021-12-08 DIAGNOSIS — R1030 Lower abdominal pain, unspecified: Secondary | ICD-10-CM

## 2021-12-08 MED ORDER — IOPAMIDOL (ISOVUE-300) INJECTION 61%
100.0000 mL | Freq: Once | INTRAVENOUS | Status: AC | PRN
  Administered 2021-12-08: 100 mL via INTRAVENOUS

## 2021-12-11 ENCOUNTER — Encounter (INDEPENDENT_AMBULATORY_CARE_PROVIDER_SITE_OTHER): Payer: Self-pay

## 2021-12-15 ENCOUNTER — Ambulatory Visit (INDEPENDENT_AMBULATORY_CARE_PROVIDER_SITE_OTHER): Payer: Medicare Other | Admitting: Family

## 2021-12-15 ENCOUNTER — Other Ambulatory Visit: Payer: Self-pay

## 2021-12-15 ENCOUNTER — Other Ambulatory Visit (HOSPITAL_COMMUNITY)
Admission: RE | Admit: 2021-12-15 | Discharge: 2021-12-15 | Disposition: A | Discharge status: Home / Self Care | Payer: Medicare Other | Source: Ambulatory Visit | Attending: Family | Admitting: Family

## 2021-12-15 ENCOUNTER — Encounter: Payer: Self-pay | Admitting: Family

## 2021-12-15 VITALS — BP 115/79 | HR 87 | Temp 98.0°F | Wt 207.0 lb

## 2021-12-15 DIAGNOSIS — B20 Human immunodeficiency virus [HIV] disease: Secondary | ICD-10-CM | POA: Diagnosis present

## 2021-12-15 DIAGNOSIS — Z113 Encounter for screening for infections with a predominantly sexual mode of transmission: Secondary | ICD-10-CM | POA: Diagnosis present

## 2021-12-15 DIAGNOSIS — R1030 Lower abdominal pain, unspecified: Secondary | ICD-10-CM | POA: Diagnosis not present

## 2021-12-15 DIAGNOSIS — Z0000 Encounter for general adult medical examination without abnormal findings: Secondary | ICD-10-CM | POA: Diagnosis not present

## 2021-12-15 MED ORDER — TIVICAY 50 MG PO TABS: 50.0000 mg | ORAL_TABLET | Freq: Every day | ORAL | 3 refills | Status: DC

## 2021-12-15 MED ORDER — SYMTUZA 800-150-200-10 MG PO TABS: 1.0000 | ORAL_TABLET | Freq: Every day | ORAL | 2 refills | Status: DC

## 2021-12-15 MED ORDER — SULFAMETHOXAZOLE-TRIMETHOPRIM 800-160 MG PO TABS: 1.0000 | ORAL_TABLET | ORAL | 2 refills | Status: DC

## 2021-12-15 NOTE — Patient Instructions (Addendum)
Nice to see you.  We will check your lab work today.  Continue to take your medication daily as prescribed.  Refills have been sent to the pharmacy.  Plan for follow up in 1 months or sooner if needed with lab work on the same day.  Have a great day and stay safe!  

## 2021-12-15 NOTE — Progress Notes (Signed)
Brief Narrative   Patient ID: Jacob Gutierrez, male    DOB: 12-02-1985, 36 y.o.   MRN: [id number]  Jacob Gutierrez is a 36 year-old African-American male diagnosed with HIV-1 disease in 2013 with risk factor of MSM.  Initial CD4 nadir of 60.  Previous history of gonorrhea, pulmonary cocciodomycosis.  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  Previous ART regimen include Stribild with Prezista and now Symtuza.  Subjective:    Chief Complaint  Patient presents with   Follow-up         HPI:  Jacob Gutierrez is a 36 y.o. male with AIDS/HIV last seen on 11/15/2021 with improved adherence and good tolerance to his ART regimen of Symtuza and Tivicay supplemented by Bactrim.  CD4 count improved to 143 with viral load down to 7000.  He was having abdominal pain at the time with CT showing no significant acute abnormalities and possibility of proctitis although not having any rectal pain at the time suspecting this is chronic.  Had started working with case management in the beginning of the month.  Here today for routine follow-up.  Jacob Gutierrez continues to take his Symtuza, Tivicay, and Bactrim daily as prescribed with no adverse side effects.  Feeling well today and has gained weight since his last office visit.  Abdominal pain has resolved. Trying hard to improve his overall health. Denies fevers, chills, night sweats, headaches, changes in vision, neck pain/stiffness, nausea, diarrhea, vomiting, lesions or rashes.  Jacob Gutierrez has no problems obtaining medication from the pharmacy and is covered by Medicare.  Denies feelings of being down, depressed, or hopeless recently.  Continues to drink alcohol on occasion, use marijuana on occasion, and tobacco daily.  Condoms offered.  Requesting STD testing.  [facility]    Allergies  Allergen Reactions   Vancomycin Anaphylaxis, Itching, Swelling and Other (See  Comments)    Angioedema and "everything swells"   Amoxicillin Other (See Comments)    From childhood: "I had a reaction when i was little." (??) Has tolerated multiple cephalosporins in the past      Outpatient Medications Prior to Visit  Medication Sig Dispense Refill   acetaminophen (TYLENOL) 500 MG tablet Take 1,000 mg by mouth every 6 (six) hours as needed for moderate pain or headache.     Darunavir-Cobicistat-Emtricitabine-Tenofovir Alafenamide (SYMTUZA) 800-150-200-10 MG TABS Take 1 tablet by mouth daily with breakfast. 30 tablet 2   dolutegravir (TIVICAY) 50 MG tablet Take 1 tablet (50 mg total) by mouth daily. 30 tablet 3   polyethylene glycol (MIRALAX) 17 g packet Take 17 g by mouth daily. (Patient not taking: Reported on 12/15/2021) 12 each 1   sulfamethoxazole-trimethoprim (BACTRIM DS) 800-160 MG tablet Take 1 tablet by mouth 3 (three) times a week. (Patient not taking: Reported on 11/15/2021) 30 tablet 2   No facility-administered medications prior to visit.     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 [facility](HCC)    H/O coccidioidomycosis    pulmonary    HIV disease (HCC)    Human monkeypox    Past history of allergy to penicillin-type antibiotic 07-03-2012   desensitization   PNA (pneumonia)    Shigella gastroenteritis    Syphilis    history Leodis Sias/treated  Past Surgical History:  Procedure Laterality Date   BIOPSY  10/16/2020   Procedure: BIOPSY;  Surgeon: Meridee Score Netty Starring., MD;  Location: [facility] ENDOSCOPY;  Service: Gastroenterology;;   BIOPSY  10/23/2020   Procedure: BIOPSY;  Surgeon: Lynann Bologna, MD;  Location: [facility] ENDOSCOPY;  Service: Endoscopy;;   COLONOSCOPY WITH PROPOFOL N/A 10/23/2020   Procedure: COLONOSCOPY WITH PROPOFOL;  Surgeon: Lynann Bologna, MD;  Location: [facility] ENDOSCOPY;  Service:  Endoscopy;  Laterality: N/A;   ESOPHAGOGASTRODUODENOSCOPY (EGD) WITH PROPOFOL N/A 10/16/2020   Procedure: ESOPHAGOGASTRODUODENOSCOPY (EGD) WITH PROPOFOL;  Surgeon: Meridee Score Netty Starring., MD;  Location: [facility] ENDOSCOPY;  Service: Gastroenterology;  Laterality: N/A;   TRANSURETHRAL RESECTION OF PROSTATE N/A 12/09/2017   Procedure: TRANSURETHRAL RESECTION OF THE PROSTATE (TURP);  Surgeon: Crist Fat, MD;  Location[facility];  Service: Urology;  Laterality: N/A;      Review of Systems  Constitutional:  Negative for appetite change, chills, fatigue, fever and unexpected weight change.  Eyes:  Negative for visual disturbance.  Respiratory:  Negative for cough, chest tightness, shortness of breath and wheezing.   Cardiovascular:  Negative for chest pain and leg swelling.  Gastrointestinal:  Negative for abdominal pain, constipation, diarrhea, nausea and vomiting.  Genitourinary:  Negative for dysuria, flank pain, frequency, genital sores, hematuria and urgency.  Skin:  Negative for rash.  Allergic/Immunologic: Negative for immunocompromised state.  Neurological:  Negative for dizziness and headaches.     Objective:    BP 115/79   Pulse 87   Temp 98 F (36.7 C) (Oral)   Wt 207 lb (93.9 kg)   SpO2 99%   BMI 27.31 kg/m  Nursing note and vital signs reviewed.  Physical Exam Constitutional:      General: He is not in acute distress.    Appearance: He is well-developed.  Eyes:     Conjunctiva/sclera: Conjunctivae normal.  Cardiovascular:     Rate and Rhythm: Normal rate and regular rhythm.     Heart sounds: Normal heart sounds. No murmur heard.   No friction rub. No gallop.  Pulmonary:     Effort: Pulmonary effort is normal. No respiratory distress.     Breath sounds: Normal breath sounds. No wheezing or rales.  Chest:     Chest wall: No tenderness.  Abdominal:     General: Bowel sounds are normal.     Palpations: Abdomen is soft.     Tenderness: There is no abdominal  tenderness.  Musculoskeletal:     Cervical back: Neck supple.  Lymphadenopathy:     Cervical: No cervical adenopathy.  Skin:    General: Skin is warm and dry.     Findings: No rash.  Neurological:     Mental Status: He is alert and oriented to person, place, and time.  Psychiatric:        Behavior: Behavior normal.        Thought Content: Thought content normal.        Judgment: Judgment normal.        11/15/2021    1:47 PM 06/23/2021    1:59 PM 04/29/2021   11:51 AM 03/30/2021    1:42 PM 10/15/2020    9:47 AM  Depression screen PHQ 2/9  Decreased Interest 0 0 0 0 0  Down, Depressed, Hopeless 0 0 0 0 1  PHQ - 2 Score 0 0 0 0 1       Assessment & Plan:    Patient Active Problem List   Diagnosis Date Noted  Lower abdominal pain 11/15/2021   Dysuria 11/15/2021   Oral lesion    Proctocolitis 05/20/2021   HTN (hypertension) 05/20/2021   Pressure injury of skin 03/05/2021   Group A streptococcal infection    Sepsis due to group A Streptococcus without acute organ dysfunction (HCC)    Sepsis due to Escherichia coli (E. coli) (HCC) 03/04/2021   Lactic acidosis 03/04/2021   GERD (gastroesophageal reflux disease) 03/04/2021   ESBL (extended spectrum beta-lactamase) producing bacteria infection    Obstructive uropathy    Prerenal renal failure    Nonadherence to medical treatment    Acute kidney injury (HCC) 02/10/2021   Loose stools    Bilious vomiting with nausea    SIRS (systemic inflammatory response syndrome) (HCC) 02/09/2021   Hypokalemia 10/15/2020   Nausea, vomiting, and diarrhea 10/15/2020   Colitis 10/15/2020   Dysphagia 10/15/2020   COVID-19 virus infection 08/25/2020   Dark urine 08/25/2020   Healthcare maintenance 07/23/2020   Pyelonephritis 02/13/2019   Prostatitis 10/05/2018   Leukopenia 09/20/2018   Hyponatremia 09/20/2018   Avoidance coping 09/07/2018   Fever 04/28/2018   Headache 04/27/2018   Condyloma 04/24/2018   Protein-calorie malnutrition,  severe 12/12/2017   Personal history of MRSA (methicillin resistant Staphylococcus aureus) 12/09/2017   History of ESBL E. 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  Reviewed previous lab work and CT scan and discussed plan of care.  We will need to continue Bactrim until CD4 count is greater than 200 for at least 3 months or viral load becomes undetectable and CD4 count remains above 100 after 3 to 6 months.  Continue current dose of Tivicay and Symtuza supplemented with Bactrim for OI prophylaxis.  Check lab work today.  Plan for follow-up in 1 month or sooner if needed.       Relevant Medications   dolutegravir (TIVICAY) 50 MG tablet   Darunavir-Cobicistat-Emtricitabine-Tenofovir Alafenamide (SYMTUZA) 800-150-200-10 MG TABS   sulfamethoxazole-trimethoprim (BACTRIM DS) 800-160 MG tablet   Other Relevant Orders   HIV-1 RNA quant-no reflex-bld   T-helper cell (CD4)- ([facility] clinic only)   Comprehensive metabolic panel   CBC with Differential/Platelet   Healthcare maintenance    Discussed importance of safe sexual practices and condom use.  Condoms offered. STD testing per request. Routine dental care scheduled for next week.      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  Check rectal swabs for chlamydia.  Continue to monitor as needed.        Other Visit Diagnoses     Screening for STDs (sexually transmitted diseases)       Relevant Orders   [facility]   Urine cytology ancillary only   Cytology (oral, anal, urethral) ancillary only   Cytology (oral, anal, urethral) ancillary only        I am having Jacob Salk  Darlina Rumpf "Neko" maintain his acetaminophen, polyethylene glycol, Tivicay, Symtuza, and sulfamethoxazole-trimethoprim.   Meds ordered this encounter  Medications   dolutegravir (TIVICAY) 50 MG tablet    Sig: Take 1 tablet (50 mg total) by mouth daily.    Dispense:  30 tablet    Refill:  3    Will pick up today. Future refills please send to [facility].    Order Specific Question:   Supervising Provider    Answer:   Judyann Munson [[id number]]   Darunavir-Cobicistat-Emtricitabine-Tenofovir Alafenamide (SYMTUZA) 800-150-200-10 MG TABS    Sig: Take 1 tablet by mouth daily with breakfast.    Dispense:  30 tablet    Refill:  2    Please send to [facility]    Order Specific Question:   Supervising Provider    Answer:   Judyann Munson [[id number]]   sulfamethoxazole-trimethoprim (BACTRIM DS) 800-160 MG tablet    Sig: Take 1 tablet by mouth 3 (three) times a week.    Dispense:  30 tablet    Refill:  2    Please send to [facility]    Order Specific Question:   Supervising Provider    Answer:   Judyann Munson [[id number]]     Follow-up: Return in about 1 month (around 01/15/2022), or if symptoms worsen or fail to improve.   Marcos Eke, MSN, FNP-C Nurse Practitioner [facility] Main number: [phone]

## 2021-12-15 NOTE — Assessment & Plan Note (Signed)
   Discussed importance of safe sexual practices and condom use.  Condoms offered.  STD testing per request.  Routine dental care scheduled for next week.

## 2021-12-15 NOTE — Assessment & Plan Note (Signed)
Mr. Fletchall is slowly improving with better adherence and good tolerance to his ART regimen of Symtuza, Tivicay, and supplemented with Bactrim for OI prophylaxis.  Reviewed previous lab work and CT scan and discussed plan of care.  We will need to continue Bactrim until CD4 count is greater than 200 for at least 3 months or viral load becomes undetectable and CD4 count remains above 100 after 3 to 6 months.  Continue current dose of Tivicay and Symtuza supplemented with Bactrim for OI prophylaxis.  Check lab work today.  Plan for follow-up in 1 month or sooner if needed.

## 2021-12-15 NOTE — Assessment & Plan Note (Signed)
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  Check rectal swabs for chlamydia.  Continue to monitor as needed.

## 2021-12-16 ENCOUNTER — Other Ambulatory Visit: Payer: Self-pay | Admitting: Family

## 2021-12-16 ENCOUNTER — Encounter: Payer: Self-pay | Admitting: Family

## 2021-12-16 LAB — CYTOLOGY, (ORAL, ANAL, URETHRAL) ANCILLARY ONLY
Chlamydia: NEGATIVE
Chlamydia: POSITIVE — AB
Comment: NEGATIVE
Comment: NEGATIVE
Comment: NORMAL
Comment: NORMAL
Neisseria Gonorrhea: NEGATIVE
Neisseria Gonorrhea: NEGATIVE

## 2021-12-16 LAB — URINE CYTOLOGY ANCILLARY ONLY
Chlamydia: NEGATIVE
Comment: NEGATIVE
Comment: NORMAL
Neisseria Gonorrhea: NEGATIVE

## 2021-12-16 MED ORDER — DOXYCYCLINE MONOHYDRATE 100 MG PO CAPS: 100.0000 mg | ORAL_CAPSULE | Freq: Two times a day (BID) | ORAL | 0 refills | Status: DC

## 2021-12-17 ENCOUNTER — Telehealth: Payer: Self-pay

## 2021-12-17 LAB — COMPREHENSIVE METABOLIC PANEL
AG Ratio: 0.9 (calc) — ABNORMAL LOW (ref 1.0–2.5)
ALT: 17 U/L (ref 9–46)
AST: 33 U/L (ref 10–40)
Albumin: 4.2 g/dL (ref 3.6–5.1)
Alkaline phosphatase (APISO): 84 U/L (ref 36–130)
BUN/Creatinine Ratio: 16 (calc) (ref 6–22)
BUN: 21 mg/dL (ref 7–25)
CO2: 22 mmol/L (ref 20–32)
Calcium: 9.1 mg/dL (ref 8.6–10.3)
Chloride: 102 mmol/L (ref 98–110)
Creat: 1.29 mg/dL — ABNORMAL HIGH (ref 0.60–1.26)
Globulin: 4.9 g/dL (calc) — ABNORMAL HIGH (ref 1.9–3.7)
Glucose, Bld: 101 mg/dL — ABNORMAL HIGH (ref 65–99)
Potassium: 3.5 mmol/L (ref 3.5–5.3)
Sodium: 134 mmol/L — ABNORMAL LOW (ref 135–146)
Total Bilirubin: 0.8 mg/dL (ref 0.2–1.2)
Total Protein: 9.1 g/dL — ABNORMAL HIGH (ref 6.1–8.1)

## 2021-12-17 LAB — CBC WITH DIFFERENTIAL/PLATELET
Absolute Monocytes: 469 cells/uL (ref 200–950)
Basophils Absolute: 31 cells/uL (ref 0–200)
Basophils Relative: 0.6 %
Eosinophils Absolute: 281 cells/uL (ref 15–500)
Eosinophils Relative: 5.5 %
HCT: 36.1 % — ABNORMAL LOW (ref 38.5–50.0)
Hemoglobin: 12 g/dL — ABNORMAL LOW (ref 13.2–17.1)
Lymphs Abs: 1897 cells/uL (ref 850–3900)
MCH: 30.8 pg (ref 27.0–33.0)
MCHC: 33.2 g/dL (ref 32.0–36.0)
MCV: 92.6 fL (ref 80.0–100.0)
MPV: 9 fL (ref 7.5–12.5)
Monocytes Relative: 9.2 %
Neutro Abs: 2423 cells/uL (ref 1500–7800)
Neutrophils Relative %: 47.5 %
Platelets: 230 10*3/uL (ref 140–400)
RBC: 3.9 10*6/uL — ABNORMAL LOW (ref 4.20–5.80)
RDW: 14.9 % (ref 11.0–15.0)
Total Lymphocyte: 37.2 %
WBC: 5.1 10*3/uL (ref 3.8–10.8)

## 2021-12-17 LAB — T-HELPER CELL (CD4) - (RCID CLINIC ONLY)
CD4 % Helper T Cell: 11 % — ABNORMAL LOW (ref 33–65)
CD4 T Cell Abs: 184 /uL — ABNORMAL LOW (ref 400–1790)

## 2021-12-17 LAB — RPR: RPR Ser Ql: NONREACTIVE

## 2021-12-17 LAB — HIV-1 RNA QUANT-NO REFLEX-BLD
HIV 1 RNA Quant: 10100 copies/mL — ABNORMAL HIGH
HIV-1 RNA Quant, Log: 4 Log copies/mL — ABNORMAL HIGH

## 2021-12-17 NOTE — Telephone Encounter (Signed)
Called patient today regarding positive STD test. Informed patient that provider has sent in Doxy to pharmacy. Advised he complete antibiotics and refrain from all sexual activity and an additional 10 days. Will update recent partners to get tested/ treated.  Encouraged patient to use condoms to avoid further STD transmission. Patient verbalized understanding. Juanita Laster, RMA

## 2021-12-31 ENCOUNTER — Telehealth: Payer: Self-pay

## 2021-12-31 ENCOUNTER — Other Ambulatory Visit: Payer: Self-pay

## 2021-12-31 MED ORDER — TIVICAY 50 MG PO TABS
50.0000 mg | ORAL_TABLET | Freq: Every day | ORAL | 0 refills | Status: DC
Start: 2021-12-31 — End: 2022-01-20

## 2021-12-31 MED ORDER — SYMTUZA 800-150-200-10 MG PO TABS: 1.0000 | ORAL_TABLET | Freq: Every day | ORAL | 0 refills | Status: DC

## 2021-12-31 MED ORDER — SULFAMETHOXAZOLE-TRIMETHOPRIM 800-160 MG PO TABS: 1.0000 | ORAL_TABLET | ORAL | 0 refills | Status: DC

## 2021-12-31 NOTE — Telephone Encounter (Signed)
Patient called requesting 30 day refill of HIV medications be sent to [facility] on [address]. Patient states he no longer has access to the medications he picked up at his appointment two weeks ago. Per conversation with [name], ok to send in 30 day supply Symtuza, Tivicay, and Bactrium to patient's preferred pharmacy.  Patient aware refills will be sent.  Wyvonne Lenz, RN

## 2021-12-31 NOTE — Telephone Encounter (Signed)
Rxs sent to the pharmacy for the patient

## 2022-01-14 ENCOUNTER — Ambulatory Visit: Payer: Medicare Other | Admitting: Family

## 2022-01-20 ENCOUNTER — Encounter: Payer: Self-pay | Admitting: Family

## 2022-01-20 ENCOUNTER — Ambulatory Visit (INDEPENDENT_AMBULATORY_CARE_PROVIDER_SITE_OTHER): Payer: Medicare Other | Admitting: Family

## 2022-01-20 ENCOUNTER — Other Ambulatory Visit (HOSPITAL_COMMUNITY): Payer: Self-pay

## 2022-01-20 ENCOUNTER — Other Ambulatory Visit: Payer: Self-pay

## 2022-01-20 VITALS — BP 123/80 | HR 85 | Temp 98.2°F | Ht 73.0 in | Wt 211.0 lb

## 2022-01-20 DIAGNOSIS — Z0000 Encounter for general adult medical examination without abnormal findings: Secondary | ICD-10-CM

## 2022-01-20 DIAGNOSIS — B20 Human immunodeficiency virus [HIV] disease: Secondary | ICD-10-CM

## 2022-01-20 DIAGNOSIS — Z23 Encounter for immunization: Secondary | ICD-10-CM | POA: Diagnosis not present

## 2022-01-20 MED ORDER — SYMTUZA 800-150-200-10 MG PO TABS: 1.0000 | ORAL_TABLET | Freq: Every day | ORAL | 3 refills | Status: DC

## 2022-01-20 MED ORDER — TIVICAY 50 MG PO TABS: 50.0000 mg | ORAL_TABLET | Freq: Every day | ORAL | 3 refills | Status: DC

## 2022-01-20 MED ORDER — SULFAMETHOXAZOLE-TRIMETHOPRIM 800-160 MG PO TABS: 1.0000 | ORAL_TABLET | ORAL | 3 refills | Status: DC

## 2022-01-20 NOTE — Assessment & Plan Note (Addendum)
Jacob Gutierrez appears to have good adherence and tolerance to his ART regimen of Symtuza and Tivicay. Reviewed previous lab work and discussed plan of care. I am having trouble believing that he is taking his medication as regularly as he claims based on his viral loads. Discussed possibility of switching to long acting injectable medication for which he is agreeable. Will check Genotype with lab work and review previous results to ensure no resistance. Continue current dose of Symtuza, Tivicay and Bactrim. Plan for follow up in 1 month or sooner if needed.

## 2022-01-20 NOTE — Progress Notes (Signed)
Brief Narrative   Patient ID: Jacob Gutierrez, male    DOB: 1985-09-01, 36 y.o.   MRN: [id number]  Jacob Gutierrez is a 36 year-old African-American male diagnosed with HIV-1 disease in 2013 with risk factor of MSM.  Initial CD4 nadir of 60.  Previous history of gonorrhea, pulmonary cocciodomycosis.  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  Previous ART regimen include Stribild with Prezista and now Symtuza.  Subjective:    Chief Complaint  Patient presents with   Follow-up    HPI:  Jacob Gutierrez is a 36 y.o. male with HIV/AIDS last seen on 12/15/21 with poorly controlled virus with virual load of 10,100 and CD4 count 184. Positive for chlamydia and treated with doxycycline. Kidney function, liver function and electrolytes within normal ranges. Here today for follow up.  Jacob Gutierrez has been taking his medicaiton as prescribed with no adverse side effects. May have missed about 3-4 doses since his last office visit. Feeling well today with no new concerns/complaints. Denies fevers, chills, night sweats, headaches, changes in vision, neck pain/stiffness, nausea, diarrhea, vomiting, lesions or rashes.  Jacob Gutierrez has no problems obtaining medication from the pharmacy. Denies feelings of being down, depressed or hopeless recently. Drinks alcohol and smokes marijuana on occasion with daily tobacco use. Condoms offered. Received a section 8 housing voucher and is currently working on finding a place to live as his current situation has changed. He is working with [facility] as well. Healthcare maintenance includes Menveo and Tetanus.    Allergies  Allergen Reactions   Vancomycin Anaphylaxis, Itching, Swelling and Other (See Comments)    Angioedema and "everything swells"   Amoxicillin Other (See Comments)    From childhood: "I had a reaction when i was little." (??) Has tolerated multiple cephalosporins in the past      Outpatient Medications  Prior to Visit  Medication Sig Dispense Refill   acetaminophen (TYLENOL) 500 MG tablet Take 1,000 mg by mouth every 6 (six) hours as needed for moderate pain or headache.     polyethylene glycol (MIRALAX) 17 g packet Take 17 g by mouth daily. 12 each 1   Darunavir-Cobicistat-Emtricitabine-Tenofovir Alafenamide (SYMTUZA) 800-150-200-10 MG TABS Take 1 tablet by mouth daily with breakfast. 30 tablet 0   dolutegravir (TIVICAY) 50 MG tablet Take 1 tablet (50 mg total) by mouth daily. 30 tablet 0   doxycycline (MONODOX) 100 MG capsule Take 1 capsule (100 mg total) by mouth 2 (two) times daily. 28 capsule 0   sulfamethoxazole-trimethoprim (BACTRIM DS) 800-160 MG tablet Take 1 tablet by mouth 3 (three) times a week. 30 tablet 0   No facility-administered medications prior to visit.     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 [facility])    H/O coccidioidomycosis    pulmonary    HIV disease (HCC)    Human monkeypox    Past history of allergy to penicillin-type antibiotic 07-03-2012   desensitization   PNA (pneumonia)    Shigella gastroenteritis    Syphilis    history /treated      Past Surgical History:  Procedure Laterality Date   BIOPSY  10/16/2020   Procedure: BIOPSY;  Surgeon: Lemar Lofty., MD;  Location: [facility] ENDOSCOPY;  Service: Gastroenterology;;   BIOPSY  10/23/2020   Procedure: BIOPSY;  Surgeon:  Lynann Bologna, MD;  Location: [facility] ENDOSCOPY;  Service: Endoscopy;;   COLONOSCOPY WITH PROPOFOL N/A 10/23/2020   Procedure: COLONOSCOPY WITH PROPOFOL;  Surgeon: Lynann Bologna, MD;  Location: [facility] ENDOSCOPY;  Service: Endoscopy;  Laterality: N/A;   ESOPHAGOGASTRODUODENOSCOPY (EGD) WITH PROPOFOL N/A 10/16/2020   Procedure: ESOPHAGOGASTRODUODENOSCOPY (EGD) WITH PROPOFOL;  Surgeon: Meridee Score Netty Starring., MD;  Location: [facility]  ENDOSCOPY;  Service: Gastroenterology;  Laterality: N/A;   TRANSURETHRAL RESECTION OF PROSTATE N/A 12/09/2017   Procedure: TRANSURETHRAL RESECTION OF THE PROSTATE (TURP);  Surgeon: Crist Fat, MD;  Location: [facility] ORS;  Service: Urology;  Laterality: N/A;      Review of Systems  Constitutional:  Negative for appetite change, chills, fatigue, fever and unexpected weight change.  Eyes:  Negative for visual disturbance.  Respiratory:  Negative for cough, chest tightness, shortness of breath and wheezing.   Cardiovascular:  Negative for chest pain and leg swelling.  Gastrointestinal:  Negative for abdominal pain, constipation, diarrhea, nausea and vomiting.  Genitourinary:  Negative for dysuria, flank pain, frequency, genital sores, hematuria and urgency.  Skin:  Negative for rash.  Allergic/Immunologic: Negative for immunocompromised state.  Neurological:  Negative for dizziness and headaches.      Objective:    BP 123/80   Pulse 85   Temp 98.2 F (36.8 C) (Oral)   Ht 6\' 1"  (1.854 m)   Wt 211 lb (95.7 kg)   SpO2 99%   BMI 27.84 kg/m  Nursing note and vital signs reviewed.  Physical Exam Constitutional:      General: He is not in acute distress.    Appearance: He is well-developed.  Eyes:     Conjunctiva/sclera: Conjunctivae normal.  Cardiovascular:     Rate and Rhythm: Normal rate and regular rhythm.     Heart sounds: Normal heart sounds. No murmur heard.    No friction rub. No gallop.  Pulmonary:     Effort: Pulmonary effort is normal. No respiratory distress.     Breath sounds: Normal breath sounds. No wheezing or rales.  Chest:     Chest wall: No tenderness.  Abdominal:     General: Bowel sounds are normal.     Palpations: Abdomen is soft.     Tenderness: There is no abdominal tenderness.  Musculoskeletal:     Cervical back: Neck supple.  Lymphadenopathy:     Cervical: No cervical adenopathy.  Skin:    General: Skin is warm and dry.     Findings: No rash.   Neurological:     Mental Status: He is alert and oriented to person, place, and time.  Psychiatric:        Mood and Affect: Mood normal.         01/20/2022    3:04 PM 11/15/2021    1:47 PM 06/23/2021    1:59 PM 04/29/2021   11:51 AM 03/30/2021    1:42 PM  Depression screen PHQ 2/9  Decreased Interest 0 0 0 0 0  Down, Depressed, Hopeless 0 0 0 0 0  PHQ - 2 Score 0 0 0 0 0       Assessment & Plan:    Patient Active Problem List   Diagnosis Date Noted   Lower abdominal pain 11/15/2021   Dysuria 11/15/2021   Oral lesion    Proctocolitis 05/20/2021   HTN (hypertension) 05/20/2021   Pressure injury of skin 03/05/2021   Group A streptococcal infection    Sepsis due to group A Streptococcus without acute organ dysfunction (HCC)  Sepsis due to Escherichia coli (E. coli) (HCC) 03/04/2021   Lactic acidosis 03/04/2021   GERD (gastroesophageal reflux disease) 03/04/2021   ESBL (extended spectrum beta-lactamase) producing bacteria infection    Obstructive uropathy    Prerenal renal failure    Nonadherence to medical treatment    Acute kidney injury (HCC) 02/10/2021   Loose stools    Bilious vomiting with nausea    SIRS (systemic inflammatory response syndrome) (HCC) 02/09/2021   Hypokalemia 10/15/2020   Nausea, vomiting, and diarrhea 10/15/2020   Colitis 10/15/2020   Dysphagia 10/15/2020   COVID-19 virus infection 08/25/2020   Dark urine 08/25/2020   Healthcare maintenance 07/23/2020   Pyelonephritis 02/13/2019   Prostatitis 10/05/2018   Leukopenia 09/20/2018   Hyponatremia 09/20/2018   Avoidance coping 09/07/2018   Fever 04/28/2018   Headache 04/27/2018   Condyloma 04/24/2018   Protein-calorie malnutrition, severe 12/12/2017   Personal history of MRSA (methicillin resistant Staphylococcus aureus) 12/09/2017   History of ESBL E. 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 Reviewed previous lab work and discussed plan of care. I am having trouble believing that he is taking his medication as regularly as he claims based on his viral loads. Discussed possibility of switching to long acting injectable medication for which he is agreeable. Will check Genotype with lab work and review previous results to ensure no resistance. Continue current dose of Symtuza, Tivicay and Bactrim. Plan for follow up in 1 month or sooner if needed.       Relevant Medications   Darunavir-Cobicistat-Emtricitabine-Tenofovir Alafenamide (SYMTUZA) 800-150-200-10 MG TABS   dolutegravir (TIVICAY) 50 MG tablet   sulfamethoxazole-trimethoprim (BACTRIM DS) 800-160 MG tablet (Start on 01/21/2022)   Other Relevant Orders   HIV RNA, RTPCR W/R GT (RTI, PI,INT)   T-helper cell (CD4)- ([facility] clinic only)   MENINGOCOCCAL MCV4O (Completed)   Tdap vaccine greater than or equal to 7yo IM (Completed)   Healthcare maintenance    Discussed importance of safe sexual practice, condom use and family planning.  Menveo and tetanus updated today.       Other Visit Diagnoses     Need for tetanus booster       Relevant Orders   Tdap vaccine greater than or equal to 7yo IM (Completed)   Need for meningococcal vaccination       Relevant Orders   MENINGOCOCCAL MCV4O (Completed)        I have discontinued Jacob Columbia "Neko"'s doxycycline. I am also having him maintain his acetaminophen, polyethylene glycol, Symtuza, Tivicay, and sulfamethoxazole-trimethoprim.   Meds ordered this encounter   Medications   Darunavir-Cobicistat-Emtricitabine-Tenofovir Alafenamide (SYMTUZA) 800-150-200-10 MG TABS    Sig: Take 1 tablet by mouth daily with breakfast.    Dispense:  30 tablet    Refill:  3    Please mail to [facility]    Order Specific Question:   Supervising Provider    Answer:   Judyann Munson [phone]  dolutegravir (TIVICAY) 50 MG tablet    Sig: Take 1 tablet (50 mg total) by mouth daily.    Dispense:  30 tablet    Refill:  3    Please mail to [facility]    Order Specific Question:   Supervising Provider    Answer:   Drue Second, Aram Beecham [[id number]]   sulfamethoxazole-trimethoprim (BACTRIM DS) 800-160 MG tablet    Sig: Take 1 tablet by mouth 3 (three) times a week.    Dispense:  30 tablet    Refill:  3    Please mail to [facility]    Order Specific Question:   Supervising Provider    Answer:   Judyann Munson [[id number]]     Follow-up: Return in about 1 month (around 02/19/2022), or if symptoms worsen or fail to improve.   Marcos Eke, MSN, FNP-C Nurse Practitioner [facility] for Infectious Disease [facility] Main number: [phone]

## 2022-01-20 NOTE — Patient Instructions (Addendum)
Nice to see you.  We will check your lab work today.  Continue to take your medication daily as prescribed.  Refills have been sent to the pharmacy.  Plan for follow up in 1 months or sooner if needed with lab work on the same day.  Have a great day and stay safe!  

## 2022-01-20 NOTE — Assessment & Plan Note (Signed)
   Discussed importance of safe sexual practice, condom use and family planning.   Menveo and tetanus updated today.

## 2022-01-21 ENCOUNTER — Telehealth: Payer: Self-pay

## 2022-01-21 NOTE — Telephone Encounter (Signed)
Received call today from [place] at [facility] to inform office that CD4 has clotted.  Will update provider. Juanita Laster, RMA

## 2022-02-02 LAB — HIV RNA, RTPCR W/R GT (RTI, PI,INT)
HIV 1 RNA Quant: 97900 copies/mL — ABNORMAL HIGH
HIV-1 RNA Quant, Log: 4.99 Log copies/mL — ABNORMAL HIGH

## 2022-02-02 LAB — HIV-1 INTEGRASE GENOTYPE

## 2022-02-02 LAB — HIV-1 GENOTYPE: HIV-1 Genotype: DETECTED — AB

## 2022-02-17 ENCOUNTER — Ambulatory Visit: Payer: Medicare Other | Admitting: Family

## 2022-02-18 ENCOUNTER — Ambulatory Visit: Payer: Medicare Other | Admitting: Family

## 2022-02-23 ENCOUNTER — Other Ambulatory Visit (HOSPITAL_COMMUNITY): Payer: Self-pay

## 2022-02-23 ENCOUNTER — Telehealth: Payer: Self-pay

## 2022-02-23 ENCOUNTER — Ambulatory Visit: Payer: Medicare Other | Admitting: Family

## 2022-02-23 NOTE — Telephone Encounter (Signed)
[facility] Patient Advocate Encounter  Renaldo Harrison is covered under patient pharmacy benefits.  Prescription can be filled at [facility].  Clearance Coots , CPhT Specialty Pharmacy Patient [facility] for Infectious Disease Phone: [phone] Fax:  [phone]

## 2022-04-04 ENCOUNTER — Telehealth: Payer: Self-pay | Admitting: Family

## 2022-04-04 NOTE — Telephone Encounter (Signed)
Pt sent MyChart message requesting a f/u appt. Attempted to call and messaged pt back on 9/5. Messaged pt back of some openings for Jeanine Luz NP. Pt requested to call instead of messaging. Called again today 9/11 at 0900 and left VM.

## 2022-04-27 ENCOUNTER — Telehealth: Payer: Self-pay | Admitting: Family

## 2022-04-27 NOTE — Telephone Encounter (Signed)
Left message to call for apt

## 2022-06-30 ENCOUNTER — Other Ambulatory Visit (HOSPITAL_COMMUNITY): Payer: Self-pay

## 2022-06-30 ENCOUNTER — Other Ambulatory Visit: Payer: Self-pay

## 2022-06-30 ENCOUNTER — Other Ambulatory Visit: Payer: Self-pay | Admitting: Family

## 2022-06-30 ENCOUNTER — Telehealth: Payer: Self-pay

## 2022-06-30 DIAGNOSIS — B20 Human immunodeficiency virus [HIV] disease: Secondary | ICD-10-CM

## 2022-06-30 MED ORDER — SULFAMETHOXAZOLE-TRIMETHOPRIM 800-160 MG PO TABS
1.0000 | ORAL_TABLET | ORAL | 0 refills | Status: DC
  Filled 2022-06-30 – 2022-07-24 (×4): qty 30, 70d supply, fill #0

## 2022-06-30 MED ORDER — SYMTUZA 800-150-200-10 MG PO TABS: 1.0000 | ORAL_TABLET | Freq: Every day | ORAL | 0 refills | Status: DC

## 2022-06-30 MED ORDER — SULFAMETHOXAZOLE-TRIMETHOPRIM 800-160 MG PO TABS: 1.0000 | ORAL_TABLET | ORAL | 0 refills | Status: DC

## 2022-06-30 NOTE — Telephone Encounter (Signed)
Patient overdue for appointment, called to schedule, no answer. Left HIPAA compliant voicemail requesting callback.   Dewitt Judice D Adalida Garver, RN  

## 2022-07-01 ENCOUNTER — Other Ambulatory Visit (HOSPITAL_COMMUNITY): Payer: Self-pay

## 2022-07-24 ENCOUNTER — Other Ambulatory Visit (HOSPITAL_COMMUNITY): Payer: Self-pay

## 2022-07-26 ENCOUNTER — Other Ambulatory Visit: Payer: Self-pay

## 2022-07-27 ENCOUNTER — Other Ambulatory Visit: Payer: Self-pay

## 2022-07-29 ENCOUNTER — Other Ambulatory Visit (HOSPITAL_COMMUNITY): Payer: Self-pay

## 2022-10-18 ENCOUNTER — Ambulatory Visit (INDEPENDENT_AMBULATORY_CARE_PROVIDER_SITE_OTHER): Payer: 59 | Admitting: Family

## 2022-10-18 ENCOUNTER — Other Ambulatory Visit (HOSPITAL_COMMUNITY)
Admission: RE | Admit: 2022-10-18 | Discharge: 2022-10-18 | Disposition: A | Discharge status: Home / Self Care | Payer: 59 | Source: Ambulatory Visit | Attending: Family | Admitting: Family

## 2022-10-18 ENCOUNTER — Other Ambulatory Visit: Payer: Self-pay

## 2022-10-18 ENCOUNTER — Encounter: Payer: Self-pay | Admitting: Family

## 2022-10-18 VITALS — BP 123/80 | HR 85 | Temp 98.1°F | Resp 16 | Ht 73.0 in | Wt 210.0 lb

## 2022-10-18 DIAGNOSIS — Z0000 Encounter for general adult medical examination without abnormal findings: Secondary | ICD-10-CM | POA: Diagnosis not present

## 2022-10-18 DIAGNOSIS — B20 Human immunodeficiency virus [HIV] disease: Secondary | ICD-10-CM

## 2022-10-18 DIAGNOSIS — Z113 Encounter for screening for infections with a predominantly sexual mode of transmission: Secondary | ICD-10-CM | POA: Insufficient documentation

## 2022-10-18 MED ORDER — SULFAMETHOXAZOLE-TRIMETHOPRIM 800-160 MG PO TABS: 1.0000 | ORAL_TABLET | ORAL | 3 refills | Status: DC

## 2022-10-18 MED ORDER — SYMTUZA 800-150-200-10 MG PO TABS: 1.0000 | ORAL_TABLET | Freq: Every day | ORAL | 3 refills | Status: DC

## 2022-10-18 NOTE — Patient Instructions (Signed)
Nice to see you.  We will check your lab work today.  Continue to take your medication daily as prescribed.  Refills have been sent to the pharmacy.  Plan for follow up in 3 months or sooner if needed with lab work on the same day.  Have a great day and stay safe!  

## 2022-10-18 NOTE — Progress Notes (Unsigned)
Brief Narrative   Patient ID: Jacob Gutierrez, male    DOB: 19-Nov-1985, 37 y.o.   MRN: [id number]  Jacob Gutierrez is a 37 year-old African-American male diagnosed with HIV-1 disease in 2013 with risk factor of MSM.  Initial CD4 nadir of 60.  Previous history of gonorrhea, pulmonary cocciodomycosis.  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  Previous ART regimen include Stribild with Prezista and now Symtuza.   Subjective:    Chief Complaint  Patient presents with   Follow-up    B20     HPI:  Jacob Gutierrez is a 37 y.o. male with AIDS last seen on 01/20/22 with poorly controlled virus and less than optimal adherence to Symtuza despite his indications of adherence. Viral load was 97,900 and CD4 count 184. Renal function, electrolytes and hepatic function normal. STD testing was positive for chlamydia and was treated with doxycycline. Here today for follow up.  Jacob Gutierrez has been out of medication for at least the last month and has been using previously untaken medication. Good tolerance to Symtuza and Bactrim when taking it. Has concern may have been exposed to syphilis and would like STD testing. Going to be staying with his sister in [place] for a few months. Currently in his apartment however eviction is in process. Condoms and STD testing offered. Declines vaccinations.   Denies fevers, chills, night sweats, headaches, changes in vision, neck pain/stiffness, nausea, diarrhea, vomiting, lesions or rashes.   Allergies  Allergen Reactions   Vancomycin Anaphylaxis, Itching, Swelling and Other (See Comments)    Angioedema and "everything swells"   Amoxicillin Other (See Comments)    From childhood: "I had a reaction when i was little." (??) Has tolerated multiple cephalosporins in the past      Outpatient Medications Prior to Visit  Medication Sig Dispense Refill   acetaminophen (TYLENOL) 500 MG tablet Take 1,000 mg by mouth every 6 (six)  hours as needed for moderate pain or headache. (Patient not taking: Reported on 10/18/2022)     dolutegravir (TIVICAY) 50 MG tablet TAKE 1 TABLET(50 MG) BY MOUTH DAILY (Patient not taking: Reported on 10/18/2022) 30 tablet 0   polyethylene glycol (MIRALAX) 17 g packet Take 17 g by mouth daily. (Patient not taking: Reported on 10/18/2022) 12 each 1   Darunavir-Cobicistat-Emtricitabine-Tenofovir Alafenamide (SYMTUZA) 800-150-200-10 MG TABS Take 1 tablet by mouth daily with breakfast. (Patient not taking: Reported on 10/18/2022) 30 tablet 0   sulfamethoxazole-trimethoprim (BACTRIM DS) 800-160 MG tablet Take 1 tablet by mouth 3 (three) times a week. (Patient not taking: Reported on 10/18/2022) 30 tablet 0   No facility-administered medications prior to visit.     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 [facility])    H/O coccidioidomycosis    pulmonary    HIV disease ([place])    Human monkeypox    Past history of allergy to penicillin-type antibiotic 07-03-2012   desensitization   PNA (pneumonia)    Shigella gastroenteritis    Syphilis    history /treated      Past Surgical History:  Procedure Laterality Date   BIOPSY  10/16/2020   Procedure: BIOPSY;  Surgeon: Irving Copas., MD;  Location: [facility];  Service: Gastroenterology;;   BIOPSY  10/23/2020   Procedure: BIOPSY;  Surgeon: Jackquline Denmark, MD;  Location: [facility] ENDOS[facility];  Service: Endoscopy;;   COLONOSCOPY WITH PROPOFOL N/A 10/23/2020   Procedure: COLONOSCOPY WITH PROPOFOL;  Surgeon: Jackquline Denmark, MD;  [facility] END[facility];  Service: Endoscopy;  Laterality: N/A;   ESOPHAGOGASTRODUODENOSCOPY (EGD) WITH PROPOFOL N/A 10/16/2020   Procedure: ESOPHAGOGASTRODUODENOSCOPY (EGD) WITH PROPOFOL;  Surgeon: Rush Landmark Telford Nab., MD;  Location: [facility];  Service:  Gastroenterology;  Laterality: N/A;   TRANSURETHRAL RESECTION OF PROSTATE N/A 12/09/2017   Procedure: TRANSURETHRAL RESECTION OF THE PROSTATE (TURP);  Surgeon: Ardis Hughs, MD;  Location: [facility];  Service: Urology;  Laterality: N/A;    Review of Systems  Constitutional:  Negative for appetite change, chills, fatigue, fever and unexpected weight change.  Eyes:  Negative for visual disturbance.  Respiratory:  Negative for cough, chest tightness, shortness of breath and wheezing.   Cardiovascular:  Negative for chest pain and leg swelling.  Gastrointestinal:  Negative for abdominal pain, constipation, diarrhea, nausea and vomiting.  Genitourinary:  Negative for dysuria, flank pain, frequency, genital sores, hematuria and urgency.  Skin:  Negative for rash.  Allergic/Immunologic: Negative for immunocompromised state.  Neurological:  Negative for dizziness and headaches.      Objective:    BP 123/80   Pulse 85   Temp 98.1 F (36.7 C) (Temporal)   Resp 16   Ht 6\' 1"  (1.854 m)   Wt 210 lb (95.3 kg)   SpO2 100%   BMI 27.71 kg/m  Nursing note and vital signs reviewed.  Physical Exam Constitutional:      General: He is not in acute distress.    Appearance: He is well-developed.  Eyes:     Conjunctiva/sclera: Conjunctivae normal.  Cardiovascular:     Rate and Rhythm: Normal rate and regular rhythm.     Heart sounds: Normal heart sounds. No murmur heard.    No friction rub. No gallop.  Pulmonary:     Effort: Pulmonary effort is normal. No respiratory distress.     Breath sounds: Normal breath sounds. No wheezing or rales.  Chest:     Chest wall: No tenderness.  Abdominal:     General: Bowel sounds are normal.     Palpations: Abdomen is soft.     Tenderness: There is no abdominal tenderness.  Musculoskeletal:     Cervical back: Neck supple.  Lymphadenopathy:     Cervical: No cervical adenopathy.  Skin:    General: Skin is warm and dry.     Findings: No rash.   Neurological:     Mental Status: He is alert and oriented to person, place, and time.  Psychiatric:        Behavior: Behavior normal.        Thought Content: Thought content normal.        Judgment: Judgment normal.         10/18/2022   10:26 AM 01/20/2022    3:04 PM 11/15/2021    1:47 PM 06/23/2021    1:59 PM 04/29/2021   11:51 AM  Depression screen PHQ 2/9  Decreased Interest 0 0 0 0 0  Down, Depressed, Hopeless 0 0 0 0 0  PHQ - 2 Score 0 0 0 0 0       Assessment & Plan:    Patient Active Problem List   Diagnosis Date Noted   Lower abdominal pain 11/15/2021   Dysuria 11/15/2021   Oral lesion    Proctocolitis 05/20/2021   HTN (hypertension) 05/20/2021   Pressure injury of skin 03/05/2021   Group A  streptococcal infection    Sepsis due to group A Streptococcus without acute organ dysfunction ([place])    Sepsis due to Escherichia coli (E. coli) ([place]) 03/04/2021   Lactic acidosis 03/04/2021   GERD (gastroesophageal reflux disease) 03/04/2021   ESBL (extended spectrum beta-lactamase) producing bacteria infection    Obstructive uropathy    Prerenal renal failure    Nonadherence to medical treatment    Acute kidney injury ([place]) 02/10/2021   Loose stools    Bilious vomiting with nausea    SIRS (systemic inflammatory response syndrome) ([place]) 02/09/2021   Hypokalemia 10/15/2020   Nausea, vomiting, and diarrhea 10/15/2020   Colitis 10/15/2020   Dysphagia 10/15/2020   COVID-19 virus infection 08/25/2020   Dark urine 08/25/2020   Healthcare maintenance 07/23/2020   Pyelonephritis 02/13/2019   Prostatitis 10/05/2018   Leukopenia 09/20/2018   Hyponatremia 09/20/2018   Avoidance coping 09/07/2018   Fever 04/28/2018   Headache 04/27/2018   Condyloma 04/24/2018   Protein-calorie malnutrition, severe 12/12/2017   Personal history of MRSA (methicillin resistant Staphylococcus aureus) 12/09/2017   History of ESBL E. 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 Discussed U=U and importance of being undetectable to reduce risk of disease progression or complication. Check lab work today. Continue current dose of Symtuza and Bactrim. Plan for follow up in 3 months or sooner if needed.       Relevant Medications   Darunavir-Cobicistat-Emtricitabine-Tenofovir Alafenamide (SYMTUZA) 800-150-200-10 MG TABS   sulfamethoxazole-trimethoprim (BACTRIM DS) 800-160 MG tablet   Other Relevant Orders   COMPLETE METABOLIC PANEL WITH GFR (Completed)   HIV RNA, RTPCR W/R GT (RTI, PI,INT)   T-helper cell (CD4)- ([facility] clinic only)   Healthcare maintenance    Discussed importance of safe sexual practice and condom use. Condoms and STD testing offered.  Declines vaccines Encouraged routine dental care and can refer to [facility] if needed. Discussed anal pap smear recommendations and will consider at next office visit.       Other Visit Diagnoses     Screening for STDs (sexually transmitted diseases)       Relevant Orders   RPR   Urine cytology ancillary only   Cytology (oral, anal, urethral) ancillary only   Cytology (oral, anal, urethral) ancillary only        I am having Federico Flake "Jacob Gutierrez" maintain his acetaminophen, polyethylene glycol, Tivicay, Symtuza, and sulfamethoxazole-trimethoprim.   Meds ordered this encounter  Medications    Darunavir-Cobicistat-Emtricitabine-Tenofovir Alafenamide (SYMTUZA) 800-150-200-10 MG TABS    Sig: Take 1 tablet by mouth daily with breakfast.    Dispense:  30 tablet    Refill:  3    Order Specific Question:   Supervising Provider    Answer:   Baxter Flattery, CYNTHIA [[id number]]   sulfamethoxazole-trimethoprim (BACTRIM DS) 800-160 MG tablet    Sig: Take 1 tablet by mouth  3 (three) times a week.    Dispense:  30 tablet    Refill:  3    Order Specific Question:   Supervising Provider    Answer:   Carlyle Basques [[id number]]     Follow-up: Return in about 3 months (around 01/18/2023), or if symptoms worsen or fail to improve.   [name], MSN, FNP-C Nurse Practitioner [facility] [place] number: [phone]

## 2022-10-19 ENCOUNTER — Encounter: Payer: Self-pay | Admitting: Family

## 2022-10-19 LAB — T-HELPER CELL (CD4) - (RCID CLINIC ONLY)
CD4 % Helper T Cell: 4 % — ABNORMAL LOW (ref 33–65)
CD4 T Cell Abs: <35 /uL — ABNORMAL LOW (ref 400–1790)

## 2022-10-19 LAB — URINE CYTOLOGY ANCILLARY ONLY
Chlamydia: NEGATIVE
Comment: NEGATIVE
Comment: NORMAL
Neisseria Gonorrhea: NEGATIVE

## 2022-10-19 LAB — CYTOLOGY, (ORAL, ANAL, URETHRAL) ANCILLARY ONLY
Chlamydia: NEGATIVE
Chlamydia: POSITIVE — AB
Comment: NEGATIVE
Comment: NEGATIVE
Comment: NORMAL
Comment: NORMAL
Neisseria Gonorrhea: NEGATIVE
Neisseria Gonorrhea: POSITIVE — AB

## 2022-10-19 NOTE — Assessment & Plan Note (Signed)
Discussed importance of safe sexual practice and condom use. Condoms and STD testing offered.  Declines vaccines Encouraged routine dental care and can refer to [facility] if needed. Discussed anal pap smear recommendations and will consider at next office visit.

## 2022-10-19 NOTE — Assessment & Plan Note (Signed)
Jacob Gutierrez continues to have poorly controlled virus with less than optimal adherence and good tolerance to Symtuza although based on previous viral loads I am suspicious that he continues to not take his medication at all. Discussed U=U and importance of being undetectable to reduce risk of disease progression or complication. Check lab work today. Continue current dose of Symtuza and Bactrim. Plan for follow up in 3 months or sooner if needed.

## 2022-10-24 ENCOUNTER — Telehealth: Payer: Self-pay

## 2022-10-24 NOTE — Telephone Encounter (Signed)
Jacob Gutierrez, DIS called office to follow up on lab results for patient done on 3/26. Is requesting history on patient. Relayed information in note.  Will forward message to provider to advise on labs. Leatrice Jewels, RMA

## 2022-10-24 NOTE — Telephone Encounter (Signed)
-----   Message from Golden Circle, [place] sent at 10/24/2022  4:17 PM EDT ----- Please inform Jacob Gutierrez that he is positive for gonorrhea, chlamydia and syphilis and will need to be treated with 500 mg ceftriaxone IM once, Bicillin 2.4 million units IM once, and azithromycin 1g PO once.

## 2022-10-24 NOTE — Telephone Encounter (Signed)
Called patient to relay results and schedule treatment, no answer. Left HIPAA compliant voicemail requesting callback.   Woods Gangemi D Herold Salguero, RN  

## 2022-10-26 ENCOUNTER — Telehealth: Payer: Self-pay

## 2022-10-26 ENCOUNTER — Ambulatory Visit (INDEPENDENT_AMBULATORY_CARE_PROVIDER_SITE_OTHER): Payer: 59

## 2022-10-26 ENCOUNTER — Other Ambulatory Visit: Payer: Self-pay

## 2022-10-26 VITALS — Resp 16 | Ht 73.0 in

## 2022-10-26 DIAGNOSIS — A549 Gonococcal infection, unspecified: Secondary | ICD-10-CM | POA: Diagnosis not present

## 2022-10-26 DIAGNOSIS — A539 Syphilis, unspecified: Secondary | ICD-10-CM

## 2022-10-26 MED ORDER — PENICILLIN G BENZATHINE 1200000 UNIT/2ML IM SUSY
1.2000 10*6.[IU] | PREFILLED_SYRINGE | Freq: Once | INTRAMUSCULAR | Status: AC
  Administered 2022-10-26: 1.2 10*6.[IU] via INTRAMUSCULAR

## 2022-10-26 MED ORDER — CEFTRIAXONE SODIUM 500 MG IJ SOLR
500.0000 mg | Freq: Once | INTRAMUSCULAR | Status: AC
  Administered 2022-10-26: 500 mg via INTRAMUSCULAR

## 2022-10-26 MED ORDER — AZITHROMYCIN 250 MG PO TABS
1000.0000 mg | ORAL_TABLET | Freq: Once | ORAL | Status: AC
  Administered 2022-10-26: 1000 mg via ORAL

## 2022-10-26 NOTE — Telephone Encounter (Signed)
Patient returned call, relayed positive results for gonorrhea, chlamydia, and syphilis. He will come in this afternoon for treatment. Advised no sex until treatment completed plus an additional 10 days. Encouraged him to notify partners for testing and treatment.   He has an allergy to amoxicillin listed. Per chart review, it appears he was successfully desensitized to penicillin and has received Bicillin previously. Will route to provider.   Beryle Flock, RN

## 2022-10-26 NOTE — Telephone Encounter (Signed)
Second attempt to reach patient, MyChart message has been read.   Beryle Flock, RN

## 2022-10-26 NOTE — Telephone Encounter (Signed)
Called patient to see if they need to reschedule and reassured will be in before closing today.

## 2022-10-27 ENCOUNTER — Emergency Department (HOSPITAL_COMMUNITY): Payer: 59

## 2022-10-27 ENCOUNTER — Encounter (HOSPITAL_COMMUNITY): Payer: Self-pay | Admitting: Pharmacy Technician

## 2022-10-27 ENCOUNTER — Other Ambulatory Visit (HOSPITAL_COMMUNITY): Payer: Self-pay

## 2022-10-27 ENCOUNTER — Emergency Department (HOSPITAL_COMMUNITY)
Admission: EM | Admit: 2022-10-27 | Discharge: 2022-10-27 | Disposition: A | Discharge status: Home / Self Care | Payer: 59 | Attending: Emergency Medicine | Admitting: Emergency Medicine

## 2022-10-27 DIAGNOSIS — W228XXA Striking against or struck by other objects, initial encounter: Secondary | ICD-10-CM | POA: Insufficient documentation

## 2022-10-27 DIAGNOSIS — S99921A Unspecified injury of right foot, initial encounter: Secondary | ICD-10-CM | POA: Diagnosis present

## 2022-10-27 DIAGNOSIS — S91201A Unspecified open wound of right great toe with damage to nail, initial encounter: Secondary | ICD-10-CM | POA: Diagnosis not present

## 2022-10-27 DIAGNOSIS — R6889 Other general symptoms and signs: Secondary | ICD-10-CM

## 2022-10-27 DIAGNOSIS — Z21 Asymptomatic human immunodeficiency virus [HIV] infection status: Secondary | ICD-10-CM | POA: Insufficient documentation

## 2022-10-27 DIAGNOSIS — S90221A Contusion of right lesser toe(s) with damage to nail, initial encounter: Secondary | ICD-10-CM | POA: Insufficient documentation

## 2022-10-27 DIAGNOSIS — S91209A Unspecified open wound of unspecified toe(s) with damage to nail, initial encounter: Secondary | ICD-10-CM

## 2022-10-27 DIAGNOSIS — M7989 Other specified soft tissue disorders: Secondary | ICD-10-CM | POA: Diagnosis not present

## 2022-10-27 DIAGNOSIS — S90111A Contusion of right great toe without damage to nail, initial encounter: Secondary | ICD-10-CM | POA: Diagnosis not present

## 2022-10-27 DIAGNOSIS — S90229A Contusion of unspecified lesser toe(s) with damage to nail, initial encounter: Secondary | ICD-10-CM

## 2022-10-27 DIAGNOSIS — S90211A Contusion of right great toe with damage to nail, initial encounter: Secondary | ICD-10-CM | POA: Diagnosis not present

## 2022-10-27 MED ORDER — ACETAMINOPHEN 500 MG PO TABS
500.0000 mg | ORAL_TABLET | Freq: Four times a day (QID) | ORAL | 0 refills | Status: DC | PRN
  Filled 2022-10-27 (×2): qty 30, 8d supply, fill #0

## 2022-10-27 MED ORDER — HYDROCODONE-ACETAMINOPHEN 5-325 MG PO TABS
1.0000 | ORAL_TABLET | Freq: Once | ORAL | Status: AC
  Administered 2022-10-27: 1 via ORAL
  Filled 2022-10-27: qty 1

## 2022-10-27 MED ORDER — CEPHALEXIN 500 MG PO CAPS
500.0000 mg | ORAL_CAPSULE | Freq: Four times a day (QID) | ORAL | 0 refills | Status: DC
  Filled 2022-10-27 (×2): qty 28, 7d supply, fill #0

## 2022-10-27 NOTE — ED Triage Notes (Signed)
Pt here with reports of R foot swelling for the last few days. Pt also received several injections yesterday for syphilis and feels he may have had a reaction to them. Pt endorses having chills since getting home from getting the injections.

## 2022-10-27 NOTE — Discharge Instructions (Signed)
You are seen in the emergency room for foot injury. It appears that your nail is avulsed.  There is some redness and swelling, therefore we will start you on antibiotics given your immunosuppression.   Please call the orthopedist Dr. Marlou Sa for a follow-up in 7 to 10 days.  Alternatively, he can also consider calling the podiatrist for a follow-up appointment.

## 2022-10-27 NOTE — ED Provider Notes (Signed)
[facility] Provider Note   CSN: [id number] Arrival date & time: 10/27/22  [id number]     History  Chief Complaint  Patient presents with   Foot Pain   Chills    Jacob Gutierrez is a 37 y.o. male.  HPI    37 year old patient with history of HIV AIDS comes in with chief complaint of toe pain and swelling.  Patient reports that 3 days ago he stubbed his toe.  Nail from his third toe was injured in the process.  He has noted swelling over his right foot since then.  Patient also received several injections for syphilis yesterday.  He states that since he received the injection, he has been having chills.  He denies any fevers, headaches, worsening of the rash.  Home Medications Prior to Admission medications   Medication Sig Start Date End Date Taking? Authorizing Provider  cephALEXin (KEFLEX) 500 MG capsule Take 1 capsule (500 mg total) by mouth 4 (four) times daily. 10/27/22  Yes Varney Biles, MD  acetaminophen (TYLENOL) 500 MG tablet Take 1,000 mg by mouth every 6 (six) hours as needed for moderate pain or headache. Patient not taking: Reported on 10/18/2022    [provider]  Darunavir-Cobicistat-Emtricitabine-Tenofovir Alafenamide [facility]) 800-150-200-10 MG TABS Take 1 tablet by mouth daily with breakfast. 10/18/22   Golden Circle, FNP  dolutegravir (TIVICAY) 50 MG tablet TAKE 1 TABLET(50 MG) BY MOUTH DAILY Patient not taking: Reported on 10/18/2022 06/30/22   Golden Circle, FNP  polyethylene glycol (MIRALAX) 17 g packet Take 17 g by mouth daily. Patient not taking: Reported on 10/18/2022 11/15/21   Golden Circle, FNP  sulfamethoxazole-trimethoprim (BACTRIM DS) 800-160 MG tablet Take 1 tablet by mouth 3 (three) times a week. 10/19/22   Golden Circle, FNP      Allergies    Vancomycin and Amoxicillin    Review of Systems   Review of Systems  All other systems reviewed and are negative.   Physical  Exam Updated Vital Signs BP (!) 110/57 (BP Location: Right Arm)   Pulse 97   Temp 98.7 F (37.1 C) (Oral)   Resp 16   SpO2 98%  Physical Exam Vitals and nursing note reviewed.  Constitutional:      Appearance: He is well-developed.  HENT:     Head: Atraumatic.  Cardiovascular:     Rate and Rhythm: Normal rate.  Pulmonary:     Effort: Pulmonary effort is normal.  Musculoskeletal:     Cervical back: Neck supple.     Comments: Patient's third toe nail is avulsed.  However, he does have remainder of the toe that is still intact. There is mild foot redness and swelling noted. 2+ DP noted  Skin:    General: Skin is warm.  Neurological:     Mental Status: He is alert and oriented to person, place, and time.     ED Results / Procedures / Treatments   Labs (all labs ordered are listed, but only abnormal results are displayed) Labs Reviewed - No data to display  EKG None  Radiology DG Toe 3rd Right  Result Date: 10/27/2022 CLINICAL DATA:  Swelling EXAM: RIGHT THIRD TOE three views COMPARISON:  None Available. FINDINGS: There is no evidence of fracture or dislocation. There is no evidence of arthropathy or other focal bone abnormality. Soft tissue swelling noted distally. IMPRESSION: Soft tissue swelling.  No acute osseous abnormalities identified. Electronically Signed   By: Vonna Kotyk  Pleasure M.D.   On: 10/27/2022 10:43    Procedures Procedures    Medications Ordered in ED Medications  HYDROcodone-acetaminophen (NORCO/VICODIN) 5-325 MG per tablet 1 tablet (has no administration in time range)    ED Course/ Medical Decision Making/ A&P                             Medical Decision Making Amount and/or Complexity of Data Reviewed Radiology: ordered.  Risk Prescription drug management.  This patient presents to the ED with chief complaint(s) of toe pain, foot swelling with pertinent past medical history of syphilis that was treated yesterday, HIV AIDS.The complaint  involves an extensive differential diagnosis and also carries with it a high risk of complications and morbidity.    The differential diagnosis includes : Nail avulsion, nailbed injury, foot infection, toe infection.  Doubt DVT. With him complaining of chills, JHR considered given he received IM penicillin yesterday. The initial plan is to get x-ray of the toe to ensure there is no fracture. We will put him on Keflex. Literature review on JHR indicates that the treatment is supportive with Tylenol, typically the reaction is self-limiting.  Patient does not have fevers, headaches, body aches, worsening of the skin rash.  His only symptom is chills.   Additional history obtained: Records reviewed  infectious disease note, also reviewed patient's treatment for syphilis  Independent visualization and interpretation of imaging: - I independently visualized the following imaging with scope of interpretation limited to determining acute life threatening conditions related to emergency care: X-ray of the toe, which revealed no evidence of fracture  Treatment and Reassessment: Patient reassessed.  Discussed with him the chills, and possible JHR.  Advised Tylenol intake.  Also informed her that we will give him postop shoe and he needs to follow-up with orthopedic service or podiatry for removal of the toenail.  He needs to return to the ER if he starts having worsening swelling and pain to the foot.   Final Clinical Impression(s) / ED Diagnoses Final diagnoses:  Nail avulsion of toe, initial encounter  Contusion of toe with damage to nail, unspecified laterality, unspecified toe, initial encounter    Rx / DC Orders ED Discharge Orders          Ordered    cephALEXin (KEFLEX) 500 MG capsule  4 times daily        10/27/22 [place], Dianah Pruett, MD 10/27/22 1116

## 2022-11-02 LAB — COMPLETE METABOLIC PANEL WITH GFR
AG Ratio: 0.8 (calc) — ABNORMAL LOW (ref 1.0–2.5)
ALT: 12 U/L (ref 9–46)
AST: 19 U/L (ref 10–40)
Albumin: 3.6 g/dL (ref 3.6–5.1)
Alkaline phosphatase (APISO): 76 U/L (ref 36–130)
BUN: 16 mg/dL (ref 7–25)
CO2: 23 mmol/L (ref 20–32)
Calcium: 8.3 mg/dL — ABNORMAL LOW (ref 8.6–10.3)
Chloride: 106 mmol/L (ref 98–110)
Creat: 0.94 mg/dL (ref 0.60–1.26)
Globulin: 4.8 g/dL (calc) — ABNORMAL HIGH (ref 1.9–3.7)
Glucose, Bld: 88 mg/dL (ref 65–99)
Potassium: 3.9 mmol/L (ref 3.5–5.3)
Sodium: 137 mmol/L (ref 135–146)
Total Bilirubin: 0.4 mg/dL (ref 0.2–1.2)
Total Protein: 8.4 g/dL — ABNORMAL HIGH (ref 6.1–8.1)
eGFR: 106 mL/min/{1.73_m2} (ref >=60)

## 2022-11-02 LAB — HIV-1 GENOTYPE: HIV-1 Genotype: DETECTED — AB

## 2022-11-02 LAB — HIV-1 INTEGRASE GENOTYPE

## 2022-11-02 LAB — RPR: RPR Ser Ql: REACTIVE — AB

## 2022-11-02 LAB — RPR TITER: RPR Titer: 1:128 {titer} — ABNORMAL HIGH

## 2022-11-02 LAB — T PALLIDUM AB: T Pallidum Abs: POSITIVE — AB

## 2022-11-02 LAB — HIV RNA, RTPCR W/R GT (RTI, PI,INT)
HIV 1 RNA Quant: 447000 copies/mL — ABNORMAL HIGH
HIV-1 RNA Quant, Log: 5.65 Log copies/mL — ABNORMAL HIGH

## 2022-11-03 ENCOUNTER — Other Ambulatory Visit (HOSPITAL_COMMUNITY): Payer: Self-pay

## 2022-12-06 ENCOUNTER — Other Ambulatory Visit (HOSPITAL_COMMUNITY): Payer: Self-pay

## 2022-12-08 ENCOUNTER — Telehealth: Payer: Self-pay

## 2022-12-08 NOTE — Telephone Encounter (Signed)
Detectable Viral Load Intervention   Most recent VL:  HIV 1 RNA Quant  Date Value Ref Range Status  10/18/2022 447,000 (H) copies/mL Final  01/20/2022 97,900 (H) copies/mL Final  12/15/2021 10,100 (H) NOT DETECTED copies/mL Final    Current ART regimen: Symtuza  Appointment status: patient has future appointment scheduled  Called patient to discuss medication adherence and possible barriers to care.   Medication last dispensed (per chart review):   Dispensed Days Supply Quantity Provider Pharmacy  [facility] TABLETS 10/18/2022 30 30 each Veryl Speak, FNP [facility] #...  SYMTUZA  800-150-200-10 MG TABS 06/30/2022 30 30 tablet Veryl Speak, FNP [facility] DR[facility] STORE #...  SYMTUZA TABLETS 06/02/2022 30 30 each Veryl Speak, FNP [facility] DRUG STORE #...    Medication Adherence   Jacob Gutierrez reports that his adherence has improved. Says he can think of one night where he forgot to take his medication, but otherwise he has been doing well. Denies any issues getting his medication from the pharmacy. He confirms he is taking both his Symtuza and Bactrim.    Interventions: We moved his appointment up to the first week of June with pharmacy since he will be in [place] at this time.   Sandie Ano, RN

## 2022-12-13 DIAGNOSIS — K5900 Constipation, unspecified: Secondary | ICD-10-CM | POA: Diagnosis not present

## 2022-12-13 DIAGNOSIS — R109 Unspecified abdominal pain: Secondary | ICD-10-CM | POA: Diagnosis not present

## 2022-12-13 DIAGNOSIS — R140 Abdominal distension (gaseous): Secondary | ICD-10-CM | POA: Diagnosis not present

## 2022-12-13 DIAGNOSIS — R0602 Shortness of breath: Secondary | ICD-10-CM | POA: Diagnosis not present

## 2022-12-13 DIAGNOSIS — R112 Nausea with vomiting, unspecified: Secondary | ICD-10-CM | POA: Diagnosis not present

## 2022-12-29 ENCOUNTER — Ambulatory Visit: Payer: 59 | Admitting: Pharmacist

## 2023-01-05 ENCOUNTER — Ambulatory Visit: Payer: 59 | Admitting: Pharmacist

## 2023-01-11 ENCOUNTER — Ambulatory Visit: Payer: 59 | Admitting: Family

## 2023-01-18 DIAGNOSIS — Z6824 Body mass index (BMI) 24.0-24.9, adult: Secondary | ICD-10-CM | POA: Diagnosis not present

## 2023-01-18 DIAGNOSIS — N1330 Unspecified hydronephrosis: Secondary | ICD-10-CM | POA: Diagnosis not present

## 2023-01-18 DIAGNOSIS — N179 Acute kidney failure, unspecified: Secondary | ICD-10-CM | POA: Diagnosis not present

## 2023-01-18 DIAGNOSIS — Z1152 Encounter for screening for COVID-19: Secondary | ICD-10-CM | POA: Diagnosis not present

## 2023-01-18 DIAGNOSIS — Z91148 Patient's other noncompliance with medication regimen for other reason: Secondary | ICD-10-CM | POA: Diagnosis not present

## 2023-01-18 DIAGNOSIS — Z1612 Extended spectrum beta lactamase (ESBL) resistance: Secondary | ICD-10-CM | POA: Diagnosis not present

## 2023-01-18 DIAGNOSIS — F17200 Nicotine dependence, unspecified, uncomplicated: Secondary | ICD-10-CM | POA: Diagnosis not present

## 2023-01-18 DIAGNOSIS — E872 Acidosis, unspecified: Secondary | ICD-10-CM | POA: Diagnosis not present

## 2023-01-18 DIAGNOSIS — N12 Tubulo-interstitial nephritis, not specified as acute or chronic: Secondary | ICD-10-CM | POA: Diagnosis not present

## 2023-01-18 DIAGNOSIS — A419 Sepsis, unspecified organism: Secondary | ICD-10-CM | POA: Diagnosis not present

## 2023-01-18 DIAGNOSIS — A4189 Other specified sepsis: Secondary | ICD-10-CM | POA: Diagnosis not present

## 2023-01-18 DIAGNOSIS — Z8619 Personal history of other infectious and parasitic diseases: Secondary | ICD-10-CM | POA: Diagnosis not present

## 2023-01-18 DIAGNOSIS — F17210 Nicotine dependence, cigarettes, uncomplicated: Secondary | ICD-10-CM | POA: Diagnosis not present

## 2023-01-18 DIAGNOSIS — N390 Urinary tract infection, site not specified: Secondary | ICD-10-CM | POA: Diagnosis not present

## 2023-01-18 DIAGNOSIS — N3001 Acute cystitis with hematuria: Secondary | ICD-10-CM | POA: Diagnosis not present

## 2023-01-18 DIAGNOSIS — N170 Acute kidney failure with tubular necrosis: Secondary | ICD-10-CM | POA: Diagnosis not present

## 2023-01-18 DIAGNOSIS — R6520 Severe sepsis without septic shock: Secondary | ICD-10-CM | POA: Diagnosis not present

## 2023-01-18 DIAGNOSIS — N10 Acute tubulo-interstitial nephritis: Secondary | ICD-10-CM | POA: Diagnosis not present

## 2023-01-18 DIAGNOSIS — Z880 Allergy status to penicillin: Secondary | ICD-10-CM | POA: Diagnosis not present

## 2023-01-18 DIAGNOSIS — Z881 Allergy status to other antibiotic agents status: Secondary | ICD-10-CM | POA: Diagnosis not present

## 2023-01-19 DIAGNOSIS — N1330 Unspecified hydronephrosis: Secondary | ICD-10-CM | POA: Diagnosis not present

## 2023-01-19 DIAGNOSIS — A419 Sepsis, unspecified organism: Secondary | ICD-10-CM | POA: Diagnosis not present

## 2023-03-30 ENCOUNTER — Ambulatory Visit: Payer: 59 | Admitting: Family

## 2023-04-10 ENCOUNTER — Telehealth: Payer: Self-pay

## 2023-04-10 NOTE — Telephone Encounter (Signed)
Detectable Viral Load Intervention (DVL)  Most recent VL:  HIV 1 RNA Quant  Date Value Ref Range Status  10/18/2022 447,000 (H) copies/mL Final  01/20/2022 97,900 (H) copies/mL Final  12/15/2021 10,100 (H) NOT DETECTED copies/mL Final    Last Clinic Visit:  10/26/22  Current ART regimen: Symtuza  Appointment status: patient does not have future appointment scheduled   Medication last dispensed (per chart review): Marland Kitchen Darun-Cobic-Emtricit-TenofAF   Dispensed Days Supply Quantity Provider Pharmacy  [facility] TABLETS 10/18/2022 30 30 each Veryl Speak, FNP [facility] DRUG STORE #...    Medication Adherence  NOT ABLE TO ASSESS   Barriers to Care   NOT ABLE TO ASSESS.    Interventions   Called patient to discuss medication adherence and possible barriers to care, left vm requesting call back. Juanita Laster, RMA

## 2023-05-19 ENCOUNTER — Telehealth: Payer: Self-pay

## 2023-05-19 NOTE — Telephone Encounter (Signed)
Called [facility] to schedule overdue appointment, no answer. Left HIPAA compliant voicemail requesting callback.   Sandie Ano, RN

## 2023-05-25 ENCOUNTER — Telehealth: Payer: Self-pay

## 2023-05-25 NOTE — Telephone Encounter (Signed)
Per chart review, patient has moved to [place]. [facility] notified.   Sandie Ano, RN

## 2023-07-20 ENCOUNTER — Inpatient Hospital Stay (HOSPITAL_COMMUNITY): Payer: 59

## 2023-07-20 ENCOUNTER — Emergency Department (HOSPITAL_COMMUNITY): Payer: 59

## 2023-07-20 ENCOUNTER — Inpatient Hospital Stay (HOSPITAL_COMMUNITY)
Admission: EM | Admit: 2023-07-20 | Discharge: 2023-08-10 | DRG: 975 | Disposition: A | Discharge status: Home / Self Care | Payer: 59 | Attending: Family Medicine | Admitting: Family Medicine

## 2023-07-20 DIAGNOSIS — T375X6A Underdosing of antiviral drugs, initial encounter: Secondary | ICD-10-CM | POA: Diagnosis present

## 2023-07-20 DIAGNOSIS — Z1612 Extended spectrum beta lactamase (ESBL) resistance: Secondary | ICD-10-CM | POA: Diagnosis present

## 2023-07-20 DIAGNOSIS — F17210 Nicotine dependence, cigarettes, uncomplicated: Secondary | ICD-10-CM | POA: Diagnosis present

## 2023-07-20 DIAGNOSIS — K219 Gastro-esophageal reflux disease without esophagitis: Secondary | ICD-10-CM | POA: Diagnosis present

## 2023-07-20 DIAGNOSIS — R509 Fever, unspecified: Principal | ICD-10-CM

## 2023-07-20 DIAGNOSIS — N179 Acute kidney failure, unspecified: Secondary | ICD-10-CM | POA: Diagnosis present

## 2023-07-20 DIAGNOSIS — E872 Acidosis, unspecified: Secondary | ICD-10-CM | POA: Diagnosis present

## 2023-07-20 DIAGNOSIS — E871 Hypo-osmolality and hyponatremia: Secondary | ICD-10-CM | POA: Diagnosis present

## 2023-07-20 DIAGNOSIS — N412 Abscess of prostate: Secondary | ICD-10-CM | POA: Diagnosis present

## 2023-07-20 DIAGNOSIS — R823 Hemoglobinuria: Secondary | ICD-10-CM | POA: Diagnosis present

## 2023-07-20 DIAGNOSIS — Z6824 Body mass index (BMI) 24.0-24.9, adult: Secondary | ICD-10-CM

## 2023-07-20 DIAGNOSIS — Z880 Allergy status to penicillin: Secondary | ICD-10-CM

## 2023-07-20 DIAGNOSIS — N390 Urinary tract infection, site not specified: Secondary | ICD-10-CM | POA: Diagnosis not present

## 2023-07-20 DIAGNOSIS — D709 Neutropenia, unspecified: Secondary | ICD-10-CM

## 2023-07-20 DIAGNOSIS — R6520 Severe sepsis without septic shock: Secondary | ICD-10-CM | POA: Diagnosis not present

## 2023-07-20 DIAGNOSIS — D519 Vitamin B12 deficiency anemia, unspecified: Secondary | ICD-10-CM | POA: Diagnosis present

## 2023-07-20 DIAGNOSIS — E860 Dehydration: Secondary | ICD-10-CM | POA: Diagnosis present

## 2023-07-20 DIAGNOSIS — N3001 Acute cystitis with hematuria: Secondary | ICD-10-CM | POA: Diagnosis not present

## 2023-07-20 DIAGNOSIS — Z91199 Patient's noncompliance with other medical treatment and regimen due to unspecified reason: Secondary | ICD-10-CM

## 2023-07-20 DIAGNOSIS — B9629 Other Escherichia coli [E. coli] as the cause of diseases classified elsewhere: Secondary | ICD-10-CM | POA: Diagnosis present

## 2023-07-20 DIAGNOSIS — Z5901 Sheltered homelessness: Secondary | ICD-10-CM

## 2023-07-20 DIAGNOSIS — B9729 Other coronavirus as the cause of diseases classified elsewhere: Secondary | ICD-10-CM | POA: Diagnosis present

## 2023-07-20 DIAGNOSIS — I959 Hypotension, unspecified: Secondary | ICD-10-CM | POA: Diagnosis present

## 2023-07-20 DIAGNOSIS — Z8249 Family history of ischemic heart disease and other diseases of the circulatory system: Secondary | ICD-10-CM

## 2023-07-20 DIAGNOSIS — N429 Disorder of prostate, unspecified: Secondary | ICD-10-CM | POA: Diagnosis not present

## 2023-07-20 DIAGNOSIS — A419 Sepsis, unspecified organism: Secondary | ICD-10-CM | POA: Diagnosis not present

## 2023-07-20 DIAGNOSIS — Z79899 Other long term (current) drug therapy: Secondary | ICD-10-CM | POA: Diagnosis not present

## 2023-07-20 DIAGNOSIS — N136 Pyonephrosis: Secondary | ICD-10-CM | POA: Diagnosis present

## 2023-07-20 DIAGNOSIS — A539 Syphilis, unspecified: Secondary | ICD-10-CM | POA: Diagnosis not present

## 2023-07-20 DIAGNOSIS — Z8619 Personal history of other infectious and parasitic diseases: Secondary | ICD-10-CM

## 2023-07-20 DIAGNOSIS — D72819 Decreased white blood cell count, unspecified: Secondary | ICD-10-CM

## 2023-07-20 DIAGNOSIS — B20 Human immunodeficiency virus [HIV] disease: Secondary | ICD-10-CM

## 2023-07-20 DIAGNOSIS — R809 Proteinuria, unspecified: Secondary | ICD-10-CM | POA: Diagnosis present

## 2023-07-20 DIAGNOSIS — Z9079 Acquired absence of other genital organ(s): Secondary | ICD-10-CM

## 2023-07-20 DIAGNOSIS — E440 Moderate protein-calorie malnutrition: Secondary | ICD-10-CM | POA: Insufficient documentation

## 2023-07-20 DIAGNOSIS — A4151 Sepsis due to Escherichia coli [E. coli]: Secondary | ICD-10-CM | POA: Diagnosis present

## 2023-07-20 DIAGNOSIS — D509 Iron deficiency anemia, unspecified: Secondary | ICD-10-CM | POA: Diagnosis present

## 2023-07-20 DIAGNOSIS — B342 Coronavirus infection, unspecified: Secondary | ICD-10-CM | POA: Diagnosis present

## 2023-07-20 DIAGNOSIS — Z881 Allergy status to other antibiotic agents status: Secondary | ICD-10-CM

## 2023-07-20 DIAGNOSIS — N323 Diverticulum of bladder: Secondary | ICD-10-CM | POA: Diagnosis present

## 2023-07-20 DIAGNOSIS — Z833 Family history of diabetes mellitus: Secondary | ICD-10-CM

## 2023-07-20 DIAGNOSIS — Z91138 Patient's unintentional underdosing of medication regimen for other reason: Secondary | ICD-10-CM

## 2023-07-20 DIAGNOSIS — Z87440 Personal history of urinary (tract) infections: Secondary | ICD-10-CM

## 2023-07-20 DIAGNOSIS — D61818 Other pancytopenia: Secondary | ICD-10-CM | POA: Diagnosis present

## 2023-07-20 DIAGNOSIS — J069 Acute upper respiratory infection, unspecified: Secondary | ICD-10-CM | POA: Diagnosis present

## 2023-07-20 DIAGNOSIS — Z5900 Homelessness unspecified: Secondary | ICD-10-CM | POA: Diagnosis not present

## 2023-07-20 DIAGNOSIS — R319 Hematuria, unspecified: Secondary | ICD-10-CM

## 2023-07-20 DIAGNOSIS — N368 Other specified disorders of urethra: Secondary | ICD-10-CM | POA: Diagnosis present

## 2023-07-20 DIAGNOSIS — E88A Wasting disease (syndrome) due to underlying condition: Secondary | ICD-10-CM | POA: Diagnosis present

## 2023-07-20 DIAGNOSIS — R54 Age-related physical debility: Secondary | ICD-10-CM | POA: Diagnosis present

## 2023-07-20 DIAGNOSIS — R5081 Fever presenting with conditions classified elsewhere: Secondary | ICD-10-CM | POA: Insufficient documentation

## 2023-07-20 DIAGNOSIS — N411 Chronic prostatitis: Secondary | ICD-10-CM | POA: Diagnosis present

## 2023-07-20 DIAGNOSIS — Z8042 Family history of malignant neoplasm of prostate: Secondary | ICD-10-CM

## 2023-07-20 DIAGNOSIS — I10 Essential (primary) hypertension: Secondary | ICD-10-CM | POA: Diagnosis present

## 2023-07-20 HISTORY — DX: Bacterial infection, unspecified: Z16.12

## 2023-07-20 HISTORY — DX: Bacterial infection, unspecified: A49.9

## 2023-07-20 LAB — COMPREHENSIVE METABOLIC PANEL
ALT: 16 U/L (ref 0–44)
AST: 20 U/L (ref 15–41)
Albumin: 2.7 g/dL — ABNORMAL LOW (ref 3.5–5.0)
Alkaline Phosphatase: 48 U/L (ref 38–126)
Anion gap: 9 (ref 5–15)
BUN: 17 mg/dL (ref 6–20)
CO2: 19 mmol/L — ABNORMAL LOW (ref 22–32)
Calcium: 8.2 mg/dL — ABNORMAL LOW (ref 8.9–10.3)
Chloride: 101 mmol/L (ref 98–111)
Creatinine, Ser: 1.76 mg/dL — ABNORMAL HIGH (ref 0.61–1.24)
GFR, Estimated: 50 mL/min — ABNORMAL LOW (ref >60)
Glucose, Bld: 86 mg/dL (ref 70–99)
Potassium: 3.9 mmol/L (ref 3.5–5.1)
Sodium: 129 mmol/L — ABNORMAL LOW (ref 135–145)
Total Bilirubin: 1 mg/dL (ref <1.2)
Total Protein: 9.8 g/dL — ABNORMAL HIGH (ref 6.5–8.1)

## 2023-07-20 LAB — CBC WITH DIFFERENTIAL/PLATELET
Abs Immature Granulocytes: 0.01 10*3/uL (ref 0.00–0.07)
Basophils Absolute: 0 10*3/uL (ref 0.0–0.1)
Basophils Relative: 1 %
Eosinophils Absolute: 0.1 10*3/uL (ref 0.0–0.5)
Eosinophils Relative: 3 %
HCT: 31.8 % — ABNORMAL LOW (ref 39.0–52.0)
Hemoglobin: 10.7 g/dL — ABNORMAL LOW (ref 13.0–17.0)
Immature Granulocytes: 1 %
Lymphocytes Relative: 27 %
Lymphs Abs: 0.5 10*3/uL — ABNORMAL LOW (ref 0.7–4.0)
MCH: 30.5 pg (ref 26.0–34.0)
MCHC: 33.6 g/dL (ref 30.0–36.0)
MCV: 90.6 fL (ref 80.0–100.0)
Monocytes Absolute: 0.5 10*3/uL (ref 0.1–1.0)
Monocytes Relative: 28 %
Neutro Abs: 0.8 10*3/uL — ABNORMAL LOW (ref 1.7–7.7)
Neutrophils Relative %: 40 %
Platelets: 130 10*3/uL — ABNORMAL LOW (ref 150–400)
RBC: 3.51 MIL/uL — ABNORMAL LOW (ref 4.22–5.81)
RDW: 15.7 % — ABNORMAL HIGH (ref 11.5–15.5)
WBC: 1.9 10*3/uL — ABNORMAL LOW (ref 4.0–10.5)
nRBC: 0 % (ref 0.0–0.2)

## 2023-07-20 LAB — APTT: aPTT: 32 s (ref 24–36)

## 2023-07-20 LAB — PROTIME-INR
INR: 1.2 (ref 0.8–1.2)
Prothrombin Time: 15 s (ref 11.4–15.2)

## 2023-07-20 LAB — URINALYSIS, W/ REFLEX TO CULTURE (INFECTION SUSPECTED)
Bilirubin Urine: NEGATIVE
Glucose, UA: NEGATIVE mg/dL
Ketones, ur: NEGATIVE mg/dL
Nitrite: POSITIVE — AB
Protein, ur: 100 mg/dL — AB
RBC / HPF: >50 RBC/hpf (ref 0–5)
Specific Gravity, Urine: 1.011 (ref 1.005–1.030)
WBC, UA: >50 WBC/hpf (ref 0–5)
pH: 7 (ref 5.0–8.0)

## 2023-07-20 LAB — I-STAT CG4 LACTIC ACID, ED
Lactic Acid, Venous: 0.6 mmol/L (ref 0.5–1.9)
Lactic Acid, Venous: 1 mmol/L (ref 0.5–1.9)

## 2023-07-20 LAB — IRON AND TIBC
Iron: 18 ug/dL — ABNORMAL LOW (ref 45–182)
Saturation Ratios: 10 % — ABNORMAL LOW (ref 17.9–39.5)
TIBC: 190 ug/dL — ABNORMAL LOW (ref 250–450)
UIBC: 172 ug/dL

## 2023-07-20 LAB — VITAMIN B12: Vitamin B-12: 144 pg/mL — ABNORMAL LOW (ref 180–914)

## 2023-07-20 LAB — LACTATE DEHYDROGENASE: LDH: 189 U/L (ref 98–192)

## 2023-07-20 LAB — CRYPTOCOCCAL ANTIGEN: Crypto Ag: NEGATIVE

## 2023-07-20 LAB — FOLATE: Folate: 12.8 ng/mL (ref >5.9)

## 2023-07-20 LAB — FERRITIN: Ferritin: 421 ng/mL — ABNORMAL HIGH (ref 24–336)

## 2023-07-20 MED ORDER — METRONIDAZOLE 500 MG/100ML IV SOLN
500.0000 mg | Freq: Once | INTRAVENOUS | Status: AC
Start: 2023-07-20 — End: 2023-07-20
  Administered 2023-07-20: 500 mg via INTRAVENOUS
  Filled 2023-07-20: qty 100

## 2023-07-20 MED ORDER — ACETAMINOPHEN 325 MG PO TABS
650.0000 mg | ORAL_TABLET | Freq: Once | ORAL | Status: AC | PRN
  Administered 2023-07-20: 650 mg via ORAL
  Filled 2023-07-20: qty 2

## 2023-07-20 MED ORDER — LACTATED RINGERS IV SOLN: INTRAVENOUS | Status: AC

## 2023-07-20 MED ORDER — SODIUM CHLORIDE 0.9 % IV SOLN
2.0000 g | Freq: Once | INTRAVENOUS | Status: AC
  Administered 2023-07-20: 2 g via INTRAVENOUS
  Filled 2023-07-20: qty 12.5

## 2023-07-20 MED ORDER — LINEZOLID 600 MG/300ML IV SOLN
600.0000 mg | Freq: Two times a day (BID) | INTRAVENOUS | Status: DC
  Administered 2023-07-21 – 2023-07-23 (×6): 600 mg via INTRAVENOUS
  Filled 2023-07-20 (×6): qty 300

## 2023-07-20 MED ORDER — ENOXAPARIN SODIUM 40 MG/0.4ML IJ SOSY
40.0000 mg | PREFILLED_SYRINGE | INTRAMUSCULAR | Status: DC
  Administered 2023-07-20 – 2023-08-09 (×21): 40 mg via SUBCUTANEOUS
  Filled 2023-07-20 (×21): qty 0.4

## 2023-07-20 MED ORDER — CEFEPIME HCL 2 G IV SOLR
2.0000 g | Freq: Three times a day (TID) | INTRAVENOUS | Status: DC
  Administered 2023-07-20 – 2023-07-21 (×2): 2 g via INTRAVENOUS
  Filled 2023-07-20 (×2): qty 12.5

## 2023-07-20 MED ORDER — PANTOPRAZOLE SODIUM 40 MG PO TBEC
40.0000 mg | DELAYED_RELEASE_TABLET | Freq: Every day | ORAL | Status: DC
  Administered 2023-07-21 – 2023-08-10 (×21): 40 mg via ORAL
  Filled 2023-07-20 (×21): qty 1

## 2023-07-20 MED ORDER — LINEZOLID 600 MG/300ML IV SOLN
600.0000 mg | Freq: Once | INTRAVENOUS | Status: AC
  Administered 2023-07-20: 600 mg via INTRAVENOUS
  Filled 2023-07-20: qty 300

## 2023-07-20 MED ORDER — METRONIDAZOLE 500 MG/100ML IV SOLN: 500.0000 mg | Freq: Two times a day (BID) | INTRAVENOUS | Status: DC

## 2023-07-20 MED ORDER — SODIUM BICARBONATE 650 MG PO TABS
1300.0000 mg | ORAL_TABLET | Freq: Two times a day (BID) | ORAL | Status: DC
  Administered 2023-07-20 – 2023-08-10 (×42): 1300 mg via ORAL
  Filled 2023-07-20 (×42): qty 2

## 2023-07-20 MED ORDER — SODIUM CHLORIDE 0.9 % IV SOLN: 2.0000 g | Freq: Once | INTRAVENOUS | Status: DC

## 2023-07-20 NOTE — H&P (Addendum)
History and Physical    Patient: Jacob Gutierrez GEX:[id number] DOB: 1986/06/24 DOA: 07/20/2023 DOS: the patient was seen and examined on 07/20/2023 PCP: Gardiner Barefoot, MD  Patient coming from: Home  Chief Complaint:  Chief Complaint  Patient presents with   Dysuria   HPI: Jacob Gutierrez is a 37 y.o. male with medical history significant of HIV/AIDS, medication noncompliance, remote substance abuse, pulmonary coccidiomycosis, syphilis, gonorrhea, monkeypox, ESBL E. coli UTI and HTN who presents to the ED for evaluation of dysuria, fevers and chills. Patient reports he is followed by the [facility] here in [place] but he moved to [place] and has been out of his HIV medications since June/July. He has had a history of UTIs and prostatitis and was recently admitted for sepsis due to UTI last month in [place]. He came to [facility] for the holidays and has continued to have urinary symptoms including difficulty urinating, pain with urination and dark urine. Over the last 3-4 days, he has had intermittent fevers and sweats. He also endorsed mild dyspnea on exertion and fatigue but denies any cough, headaches, dizziness, penile discharge, abdominal pain, shortness of breath or chest pain. He is sexually active and his last sexual encounter was on unprotected sexual intercourse with a male about a month ago. He reports prior history of substance abuse but has been abstinent for years.  ED course: Initial vitals with temp 101.7, RR 22, HR 104, BP 117/77, SpO2 98% on room air Labs show sodium 129, K+ 3.9, bicarb 19, creatinine 1.76, albumin 2.7, WBC 1.9, Hgb 10.7, platelet 130, PT/INR 15.0/1.2, glucose 86, lactic acid 0.6, UA shows significant hemoglobinuria, proteinuria, positive nitrite, moderate leuks, RBC >50, WBC >50, and many bacteria EKG showed normal sinus rhythm with slightly peaked P waves Chest x-ray with no active cardiopulmonary disease Sepsis protocol was activated and patient was started  on IV fluids and broad-spectrum antibiotics with IV linezolid, cefepime and Flagyl TRH was consulted for admission   Review of Systems: As mentioned in the history of present illness. All other systems reviewed and are negative. 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 [facility])    H/O coccidioidomycosis    pulmonary    HIV disease (HCC)    Human monkeypox    Past history of allergy to penicillin-type antibiotic 07-03-2012   desensitization   PNA (pneumonia)    Shigella gastroenteritis    Syphilis    history /treated    Past Surgical History:  Procedure Laterality Date   BIOPSY  10/16/2020   Procedure: BIOPSY;  Surgeon: Lemar Lofty., MD;  Location: [facility] ENDOSCOPY;  Service: Gastroenterology;;   BIOPSY  10/23/2020   Procedure: BIOPSY;  Surgeon: Lynann Bologna, MD;  Location: [facility] ENDOSCOPY;  Service: Endoscopy;;   COLONOSCOPY WITH PROPOFOL N/A 10/23/2020   Procedure: COLONOSCOPY WITH PROPOFOL;  Surgeon: Lynann Bologna, MD;  Location: [facility] ENDOSCOPY;  Service: Endoscopy;  Laterality: N/A;   ESOPHAGOGASTRODUODENOSCOPY (EGD) WITH PROPOFOL N/A 10/16/2020   Procedure: ESOPHAGOGASTRODUODENOSCOPY (EGD) WITH PROPOFOL;  Surgeon: Meridee Score Netty Starring., MD;  Location: [facility] ENDOSCOPY;  Service: Gastroenterology;  Laterality: N/A;   TRANSURETHRAL RESECTION OF PROSTATE N/A 12/09/2017   Procedure: TRANSURETHRAL RESECTION OF THE PROSTATE (TURP);  Surgeon: Crist Fat, MD;  Location: [facility] ORS;  Service: Urology;  Laterality: N/A;   Social History:  reports that he has been  smoking cigarettes and e-cigarettes. He has never used smokeless tobacco. He reports current alcohol use of about 1.0 standard drink of alcohol per week. He reports current drug use. Frequency: 1.00 time per week. Drug: Marijuana.  Allergies  Allergen Reactions    Vancomycin Anaphylaxis, Itching, Swelling and Other (See Comments)    Angioedema and "everything swells"   Amoxicillin Other (See Comments)    From childhood: "I had a reaction when i was little." (??) Has tolerated multiple cephalosporins in the past    Family History  Problem Relation Age of Onset   Hypertension Mother    Lupus Maternal Grandmother    Diabetes Maternal Grandmother    Cancer Paternal Grandfather        unknown    Prostate cancer Father    Diabetes Father    Diabetes Maternal Grandfather    Colon cancer Neg Hx    Stomach cancer Neg Hx    Esophageal cancer Neg Hx    Pancreatic cancer Neg Hx     Prior to Admission medications   Medication Sig Start Date End Date Taking? Authorizing Provider  acetaminophen (TYLENOL) 500 MG tablet Take 1 tablet (500 mg total) by mouth every 6 (six) hours as needed. 10/27/22  Yes Nanavati, Ankit, MD  pantoprazole (PROTONIX) 40 MG tablet Take 40 mg by mouth daily.   Yes [provider]  sodium bicarbonate 650 MG tablet Take 1,300 mg by mouth 2 (two) times daily.   Yes [provider]  sulfamethoxazole-trimethoprim (BACTRIM DS) 800-160 MG tablet Take 1 tablet by mouth 3 (three) times a week. 10/19/22  Yes Veryl Speak, FNP  azithromycin (ZITHROMAX) 600 MG tablet Take 1,200 mg by mouth once a week. 06/29/23   [provider]  cefdinir (OMNICEF) 300 MG capsule Take 300 mg by mouth 2 (two) times daily. 06/28/23   [provider]  Darunavir-Cobicistat-Emtricitabine-Tenofovir Alafenamide (SYMTUZA) 800-150-200-10 MG TABS Take 1 tablet by mouth daily with breakfast. Patient not taking: Reported on 07/20/2023 10/18/22   Veryl Speak, FNP  dolutegravir (TIVICAY) 50 MG tablet TAKE 1 TABLET(50 MG) BY MOUTH DAILY Patient not taking: Reported on 10/18/2022 06/30/22   Veryl Speak, FNP    Physical Exam: Vitals:   07/20/23 1730 07/20/23 1731 07/20/23 1732 07/20/23 1900  BP: 105/63  105/63 116/68  Pulse:  (!) 106 (!) 102 (!) 101 (!) 107  Resp: 12 (!) 9 14 18   Temp:    (!) 103 F (39.4 C)  TempSrc:    Oral  SpO2: 100% 100% 100% 100%   General: Frail-appearing young man laying in bed. No acute distress. HEENT: [place]/AT. Anicteric sclera. Dry mucous membrane.  No dentition.  Mild whitish plaques on the tongue. CV: Tachycardic. Regular rhythm.. No murmurs, rubs, or gallops. No LE edema Pulmonary: Lungs CTAB. Normal effort. No wheezing or rales. Abdominal: Soft, nontender, nondistended. Normal bowel sounds. Extremities: Palpable radial and DP pulses. Normal ROM. Skin: Warm and dry.  Multiple healed lesions on skin. Neuro: A&Ox3. Moves all extremities. Normal sensation to light touch. No focal deficit. Psych: Normal mood and affect  Data Reviewed: Labs show sodium 129, K+ 3.9, bicarb 19, creatinine 1.76, albumin 2.7, WBC 1.9, Hgb 10.7, platelet 130, PT/INR 15.0/1.2, glucose 86, lactic acid 0.6, UA shows significant hemoglobinuria, proteinuria, positive nitrite, moderate leuks, RBC >50, WBC >50, and many bacteria EKG showed normal sinus rhythm with slightly peaked P waves Chest x-ray with no active cardiopulmonary disease  Assessment and Plan: Jacob Gutierrez is a  37 y.o. male with medical history significant of HIV/AIDS, medication noncompliance, remote substance abuse, pulmonary coccidiomycosis, syphilis, gonorrhea, monkeypox, ESBL E. coli UTI and HTN who presents to the ED for evaluation of dysuria, fevers and chills and admitted for sepsis secondary to UTI  # Sepsis secondary to UTI Patient with a history of HIV currently not on medications and ESBL E. coli UTI presented with worsening dysuria with associated fevers and chills, found to have evidence of infection on UA. Met sepsis criteria with tachycardia, fever, leukopenia and evidence of UTI. Sepsis protocol activated in the ED. -Continue broad-spectrum antibiotics with IV linezolid, cefepime and Flagyl -Continue IV LR at 150  cc/h -Follow-up blood culture, urine culture -Trend CBC, fever curve  # Pancytopenia Patient with history of HIV/AIDS and pulmonary coccidiomycosis found to have low WBC, Hgb and platelet. This is in the setting of nonadherence to HIV meds. He is at high risk of opportunistic infections. He denies any cough or shortness of breath but reports mild dyspnea on exertion. CXR unremarkable. -Continue broad-spectrum antibiotics as above -ID consulted, recommend checking LDL, full RVP panel, acid-fast smear with culture, histoplasma antigen, Blastomyces antigen and cryptococcal antigen -Pan scan with CT chest/abdomen/pelvis after improvement in kidney function -ID will see patient tomorrow  # HIV/AIDS Uncontrolled.  Patient reports not taking any HIV medications in the past 5 to 6 months. HIV viral load elevated to 337000 1 month ago but no recent CD4 count. He is pancytopenic and febrile. -ID consulted, will see in a.m. -Follow-up repeat HIV viral load, CD4 count  # AKI # Metabolic acidosis Bump in creatinine to 1.76 and bicarb low at 19. Likely secondary to dehydration and acute infection -Continue IV hydration as above -Continue on bicarb -Avoid nephrotoxic's agents -Trend renal function  # Normocytic anemia Hemoglobin down to 10.7 from 12.0 one year ago. No evidence of active bleed. -Check iron panel, ferritin, vitamin B12, folate  # Hx of STIs Patient with a history of treated gonorrhea and syphilis.  Last RPR titer 1:32 one month ago.  He does report on unprotective sexual intercourse with a male about a month ago. -Repeat RPR -Swab for GC/chlamydia  # Hematuria # Difficulty urinating Patient with a history of hydronephrosis and difficulty urinating found to signs of have infection and hematuria on UA. Patient reports that he has had difficulty with urination dating back 2 years ago.  He was previously prescribed Flomax but patient currently not on any medications. -Check renal  ultrasound to rule out hydronephrosis -Consider outpatient urology follow-up for further evaluation  # GERD -Continue Protonix    Advance Care Planning:   Code Status: Full Code   Consults: Infectious disease  Family Communication: No family at bedside  Severity of Illness: The appropriate patient status for this patient is INPATIENT. Inpatient status is judged to be reasonable and necessary in order to provide the required intensity of service to ensure the patient's safety. The patient's presenting symptoms, physical exam findings, and initial radiographic and laboratory data in the context of their chronic comorbidities is felt to place them at high risk for further clinical deterioration. Furthermore, it is not anticipated that the patient will be medically stable for discharge from the hospital within 2 midnights of admission.   * I certify that at the point of admission it is my clinical judgment that the patient will require inpatient hospital care spanning beyond 2 midnights from the point of admission due to high intensity of service, high risk for further  deterioration and high frequency of surveillance required.*  Author: Steffanie Rainwater, MD 07/20/2023 7:59 PM  For on call review [email or web address].

## 2023-07-20 NOTE — ED Triage Notes (Signed)
Pt reports approximately three weeks of dysuria, hematuria and flank pain R>L. Pt states he has been taking Azo for several days without relief. Pt reports not taking his HIV medications since June.

## 2023-07-20 NOTE — ED Notes (Signed)
1st lactic acid normal

## 2023-07-20 NOTE — Progress Notes (Signed)
Pharmacy Antibiotic Note  Jacob Gutierrez is a 37 y.o. male admitted on 07/20/2023 with febrile neutropenia.  Pharmacy has been consulted for cefepime dosing.  Plan: Cefepime 2g IV q8h Linezolid/Flagyl per MD Follow up renal function, cultures as available, clinical progress, length of tx Follow up ID consult    Temp (24hrs), Avg:101.4 F (38.6 C), Min:100.5 F (38.1 C), Max:103 F (39.4 C)  Recent Labs  Lab 07/20/23 1310 07/20/23 1322 07/20/23 1506  WBC 1.9*  --   --   CREATININE 1.76*  --   --   LATICACIDVEN  --  0.6 1.0    CrCl cannot be calculated (Unknown ideal weight.).    Allergies  Allergen Reactions   Vancomycin Anaphylaxis, Itching, Swelling and Other (See Comments)    Angioedema and "everything swells"   Amoxicillin Other (See Comments)    From childhood: "I had a reaction when i was little." (??) Has tolerated multiple cephalosporins in the past    Antimicrobials this admission: Linezolid 12/26 >> Cefepime 12//26 >> Flagyl 12/26 >>  Dose adjustments this admission:  Microbiology results: 12/26 BCx: 12/26 UCx: 12/26 Sputum:  Thank you for allowing pharmacy to be a part of this patient's care.  Loralee Pacas, PharmD, BCPS 07/20/2023 10:16 PM  Please check AMION for all [facility] Pharmacy phone numbers After 10:00 PM, call Main Pharmacy [phone]

## 2023-07-20 NOTE — ED Provider Notes (Signed)
EMERGENCY DEPARTMENT AT [facility] Provider Note   CSN: [id number] Arrival date & time: 07/20/23  1240     History  Chief Complaint  Patient presents with   Dysuria    RHYLIN NOSKA is a 37 y.o. male.  HPI 37 year old male history of HIV, off medications for over 6 months presents today with urinary frequency fever and chills.  Patient states he has been losing weight.  His appetite has been poor.  He was living in [place] and was not taking his medications.  He is recently come back to [place] where he was previously followed for his HIV.  He reports that last night he began having frequency of urination and shaking chills with fever.Patient endorses some penile d/c and unprotected intercourse      Home Medications Prior to Admission medications   Medication Sig Start Date End Date Taking? Authorizing Provider  acetaminophen (TYLENOL) 500 MG tablet Take 1 tablet (500 mg total) by mouth every 6 (six) hours as needed. 10/27/22   Derwood Kaplan, MD  cephALEXin (KEFLEX) 500 MG capsule Take 1 capsule (500 mg total) by mouth 4 (four) times daily. 10/27/22   Derwood Kaplan, MD  Darunavir-Cobicistat-Emtricitabine-Tenofovir Alafenamide [facility]) 800-150-200-10 MG TABS Take 1 tablet by mouth daily with breakfast. 10/18/22   Veryl Speak, FNP  dolutegravir (TIVICAY) 50 MG tablet TAKE 1 TABLET(50 MG) BY MOUTH DAILY Patient not taking: Reported on 10/18/2022 06/30/22   Veryl Speak, FNP  polyethylene glycol (MIRALAX) 17 g packet Take 17 g by mouth daily. Patient not taking: Reported on 10/18/2022 11/15/21   Veryl Speak, FNP  sulfamethoxazole-trimethoprim (BACTRIM DS) 800-160 MG tablet Take 1 tablet by mouth 3 (three) times a week. 10/19/22   Veryl Speak, FNP      Allergies    Vancomycin and Amoxicillin    Review of Systems   Review of Systems  Physical Exam Updated Vital Signs BP 96/60 (BP Location: Right Arm)   Pulse 90   Temp (!) 100.5 F  (38.1 C) (Oral)   Resp 17   SpO2 99%  Physical Exam Vitals reviewed.  HENT:     Head: Normocephalic.     Right Ear: External ear normal.     Nose: Nose normal.     Mouth/Throat:     Pharynx: Oropharynx is clear.  Eyes:     Pupils: Pupils are equal, round, and reactive to light.  Cardiovascular:     Rate and Rhythm: Tachycardia present.     Pulses: Normal pulses.  Pulmonary:     Effort: Pulmonary effort is normal.     Breath sounds: Normal breath sounds.  Abdominal:     Palpations: Abdomen is soft.     Comments: Epigastric to right upper quadrant tenderness  Musculoskeletal:        General: Normal range of motion.     Cervical back: Normal range of motion.  Skin:    General: Skin is warm.     Capillary Refill: Capillary refill takes less than 2 seconds.  Neurological:     Mental Status: He is alert.  Psychiatric:        Mood and Affect: Mood normal.     ED Results / Procedures / Treatments   Labs (all labs ordered are listed, but only abnormal results are displayed) Labs Reviewed  COMPREHENSIVE METABOLIC PANEL - Abnormal; Notable for the following components:      Result Value   Sodium 129 (*)  CO2 19 (*)    Creatinine, Ser 1.76 (*)    Calcium 8.2 (*)    Total Protein 9.8 (*)    Albumin 2.7 (*)    GFR, Estimated 50 (*)    All other components within normal limits  CBC WITH DIFFERENTIAL/PLATELET - Abnormal; Notable for the following components:   WBC 1.9 (*)    RBC 3.51 (*)    Hemoglobin 10.7 (*)    HCT 31.8 (*)    RDW 15.7 (*)    Platelets 130 (*)    Neutro Abs 0.8 (*)    Lymphs Abs 0.5 (*)    All other components within normal limits  URINALYSIS, W/ REFLEX TO CULTURE (INFECTION SUSPECTED) - Abnormal; Notable for the following components:   Color, Urine AMBER (*)    APPearance CLOUDY (*)    Hgb urine dipstick LARGE (*)    Protein, ur 100 (*)    Nitrite POSITIVE (*)    Leukocytes,Ua MODERATE (*)    Bacteria, UA MANY (*)    Non Squamous Epithelial  0-5 (*)    All other components within normal limits  CULTURE, BLOOD (ROUTINE X 2)  URINE CULTURE  PROTIME-INR  APTT  PATHOLOGIST SMEAR REVIEW  RPR  I-STAT CG4 LACTIC ACID, ED  I-STAT CG4 LACTIC ACID, ED  I-STAT CG4 LACTIC ACID, ED  I-STAT CG4 LACTIC ACID, ED  GC/CHLAMYDIA PROBE AMP ([place]) NOT AT [facility]    EKG None  Radiology DG Chest 2 View Result Date: 07/20/2023 CLINICAL DATA:  Sepsis EXAM: CHEST - 2 VIEW COMPARISON:  03/03/2021 FINDINGS: The heart size and mediastinal contours are within normal limits. No focal airspace consolidation, pleural effusion, or pneumothorax. The visualized skeletal structures are unremarkable. IMPRESSION: No active cardiopulmonary disease. Electronically Signed   By: Duanne Guess D.O.   On: 07/20/2023 15:12    Procedures Procedures    Medications Ordered in ED Medications  acetaminophen (TYLENOL) tablet 650 mg (has no administration in time range)  lactated ringers infusion ( Intravenous New Bag/Given 07/20/23 1514)  metroNIDAZOLE (FLAGYL) IVPB 500 mg (has no administration in time range)  linezolid (ZYVOX) IVPB 600 mg (has no administration in time range)  ceFEPIme (MAXIPIME) 2 g in sodium chloride 0.9 % 100 mL IVPB (2 g Intravenous New Bag/Given 07/20/23 1515)    ED Course/ Medical Decision Making/ A&P                                 Medical Decision Making Amount and/or Complexity of Data Reviewed Labs: ordered. Radiology: ordered. ECG/medicine tests: ordered.  Risk OTC drugs. Prescription drug management.   37 yo male presents with off meds with fever and urinary frequency, leukopenia Urinalysis pending Patient treated with broad spectrum abx- patient with allergies- pharmacy dosed  Lactic acid normal AKI noted. Plan admission for further treatment of infection in immunocompromised patient.  Urine consistent with UTI       Final Clinical Impression(s) / ED Diagnoses Final diagnoses:  Febrile illness   Symptomatic HIV infection (HCC)  Leukopenia, unspecified type  Medically noncompliant  Urinary tract infection with hematuria, site unspecified    Rx / DC Orders ED Discharge Orders     None         Margarita Grizzle, MD 07/20/23 1547

## 2023-07-20 NOTE — Progress Notes (Signed)
ED Pharmacy Antibiotic Sign Off An antibiotic consult was received from an ED provider for vancomycin and aztreonam per pharmacy dosing for sepsis. A chart review was completed to assess appropriateness.  Allergy to amoxicillin, however, tolerates cephalosporins per chart review.  Vancomycin allergy noted. D/w Edp changed to linezolid.  The following one time order(s) were placed per pharmacy consult:  cefepime 2000 mg x 1 dose Linezolid  600 mg x 1 dose  Further antibiotic and/or antibiotic pharmacy consults should be ordered by the admitting provider if indicated.   Thank you for allowing pharmacy to be a part of this patient's care.   Delmar Landau, PharmD, BCPS 07/20/2023 2:43 PM ED Clinical Pharmacist -  [phone]

## 2023-07-20 NOTE — Sepsis Progress Note (Signed)
eLink is following this Code Sepsis. °

## 2023-07-20 NOTE — ED Notes (Signed)
ED TO INPATIENT HANDOFF REPORT  ED Nurse Name and Phone #: [phone]  S Name/Age/Gender Jacob Gutierrez 37 y.o. male Room/Bed: 016C/016C  Code Status   Code Status: Full Code  Home/SNF/Other Home Patient oriented to: self, place, time, and situation Is this baseline? Yes   Triage Complete: Triage complete  Chief Complaint Sepsis [facility]) [A41.9]  Triage Note Pt reports approximately three weeks of dysuria, hematuria and flank pain R>L. Pt states he has been taking Azo for several days without relief. Pt reports not taking his HIV medications since June.    Allergies Allergies  Allergen Reactions   Vancomycin Anaphylaxis, Itching, Swelling and Other (See Comments)    Angioedema and "everything swells"   Amoxicillin Other (See Comments)    From childhood: "I had a reaction when i was little." (??) Has tolerated multiple cephalosporins in the past    Level of Care/Admitting Diagnosis ED Disposition     ED Disposition  Admit   Condition  --   Comment  Hospital Area: [facility] [[id number]]  Level of Care: Med-Surg [16]  May admit patient to [facility] or [facility] if equivalent level of care is available:: No  Covid Evaluation: Confirmed COVID Negative  Diagnosis: Sepsis [facility]) [[id number]]  Admitting Physician: Steffanie Rainwater [[id number]]  Attending Physician: Steffanie Rainwater [[id number]]  Certification:: I certify this patient will need inpatient services for at least 2 midnights  Expected Medical Readiness: 07/23/2023          B Medical/Surgery History 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 [facility])    H/O coccidioidomycosis    pulmonary    HIV disease (HCC)    Human monkeypox    Past history of allergy to penicillin-type antibiotic 07-03-2012   desensitization   PNA  (pneumonia)    Shigella gastroenteritis    Syphilis    history /treated    Past Surgical History:  Procedure Laterality Date   BIOPSY  10/16/2020   Procedure: BIOPSY;  Surgeon: Lemar Lofty., MD;  Location: [facility] ENDOSCOPY;  Service: Gastroenterology;;   BIOPSY  10/23/2020   Procedure: BIOPSY;  Surgeon: Lynann Bologna, MD;  Location: [facility] ENDOSCOPY;  Service: Endoscopy;;   COLONOSCOPY WITH PROPOFOL N/A 10/23/2020   Procedure: COLONOSCOPY WITH PROPOFOL;  Surgeon: Lynann Bologna, MD;  Location: [facility] ENDOSCOPY;  Service: Endoscopy;  Laterality: N/A;   ESOPHAGOGASTRODUODENOSCOPY (EGD) WITH PROPOFOL N/A 10/16/2020   Procedure: ESOPHAGOGASTRODUODENOSCOPY (EGD) WITH PROPOFOL;  Surgeon: Meridee Score Netty Starring., MD;  Location: [facility] ENDOSCOPY;  Service: Gastroenterology;  Laterality: N/A;   TRANSURETHRAL RESECTION OF PROSTATE N/A 12/09/2017   Procedure: TRANSURETHRAL RESECTION OF THE PROSTATE (TURP);  Surgeon: Crist Fat, MD;  Location: [facility] ORS;  Service: Urology;  Laterality: N/A;     A IV Location/Drains/Wounds Patient Lines/Drains/Airways Status     Active Line/Drains/Airways     Name Placement date Placement time Site Days   Peripheral IV 07/20/23 22 G Anterior;Right Forearm 07/20/23  1505  Forearm  less than 1   Pressure Injury 03/04/21 Coccyx Mid Stage 2 -  Partial thickness loss of dermis presenting as a shallow open injury with a red, pink wound bed without slough. 03/04/21  2000  -- 868            Intake/Output Last 24 hours  Intake/Output Summary (Last 24 hours) at 07/20/2023  1800 Last data filed at 07/20/2023 1557 Gross per 24 hour  Intake 100.19 ml  Output --  Net 100.19 ml    Labs/Imaging Results for orders placed or performed during the hospital encounter of 07/20/23 (from the past 48 hours)  Comprehensive metabolic panel     Status: Abnormal   Collection Time: 07/20/23  1:10 PM  Result Value Ref Range   Sodium 129 (L) 135 - 145 mmol/L   Potassium 3.9 3.5 - 5.1  mmol/L   Chloride 101 98 - 111 mmol/L   CO2 19 (L) 22 - 32 mmol/L   Glucose, Bld 86 70 - 99 mg/dL    Comment: Glucose reference range applies only to samples taken after fasting for at least 8 hours.   BUN 17 6 - 20 mg/dL   Creatinine, Ser 1.91 (H) 0.61 - 1.24 mg/dL   Calcium 8.2 (L) 8.9 - 10.3 mg/dL   Total Protein 9.8 (H) 6.5 - 8.1 g/dL   Albumin 2.7 (L) 3.5 - 5.0 g/dL   AST 20 15 - 41 U/L   ALT 16 0 - 44 U/L   Alkaline Phosphatase 48 38 - 126 U/L   Total Bilirubin 1.0 <1.2 mg/dL   GFR, Estimated 50 (L) >60 mL/min    Comment: (NOTE) Calculated using the CKD-EPI Creatinine Equation (2021)    Anion gap 9 5 - 15    Comment: Performed at [facility], [address], [place], [place] [address]  CBC with Differential     Status: Abnormal   Collection Time: 07/20/23  1:10 PM  Result Value Ref Range   WBC 1.9 (L) 4.0 - 10.5 K/uL   RBC 3.51 (L) 4.22 - 5.81 MIL/uL   Hemoglobin 10.7 (L) 13.0 - 17.0 g/dL   HCT 56.2 (L) 13.0 - 86.5 %   MCV 90.6 80.0 - 100.0 fL   MCH 30.5 26.0 - 34.0 pg   MCHC 33.6 30.0 - 36.0 g/dL   RDW 78.4 (H) 69.6 - 29.5 %   Platelets 130 (L) 150 - 400 K/uL    Comment: REPEATED TO VERIFY   nRBC 0.0 0.0 - 0.2 %   Neutrophils Relative % 40 %   Neutro Abs 0.8 (L) 1.7 - 7.7 K/uL   Lymphocytes Relative 27 %   Lymphs Abs 0.5 (L) 0.7 - 4.0 K/uL   Monocytes Relative 28 %   Monocytes Absolute 0.5 0.1 - 1.0 K/uL   Eosinophils Relative 3 %   Eosinophils Absolute 0.1 0.0 - 0.5 K/uL   Basophils Relative 1 %   Basophils Absolute 0.0 0.0 - 0.1 K/uL   WBC Morphology MORPHOLOGY UNREMARKABLE    RBC Morphology MORPHOLOGY UNREMARKABLE    Smear Review MORPHOLOGY UNREMARKABLE    Immature Granulocytes 1 %   Abs Immature Granulocytes 0.01 0.00 - 0.07 K/uL    Comment: Performed at [facility], [address], [place], [place] [address]  Protime-INR     Status: None   Collection Time: 07/20/23  1:10 PM  Result Value Ref Range   Prothrombin Time 15.0 11.4 - 15.2  seconds   INR 1.2 0.8 - 1.2    Comment: (NOTE) INR goal varies based on device and disease states. Performed at [facility], [address] [place], [place] [address]   I-Stat Lactic Acid, ED     Status: None   Collection Time: 07/20/23  1:22 PM  Result Value Ref Range   Lactic Acid, Venous 0.6 0.5 - 1.9  mmol/L  APTT     Status: None   Collection Time: 07/20/23  2:55 PM  Result Value Ref Range   aPTT 32 24 - 36 seconds    Comment: Performed at [facility], [address] [place], [place] [address]  I-Stat Lactic Acid, ED     Status: None   Collection Time: 07/20/23  3:06 PM  Result Value Ref Range   Lactic Acid, Venous 1.0 0.5 - 1.9 mmol/L  Urinalysis, w/ Reflex to Culture (Infection Suspected) -Urine, Clean Catch     Status: Abnormal   Collection Time: 07/20/23  3:13 PM  Result Value Ref Range   Specimen Source URINE, CATHETERIZED    Color, Urine AMBER (A) YELLOW    Comment: BIOCHEMICALS MAY BE AFFECTED BY COLOR   APPearance CLOUDY (A) CLEAR   Specific Gravity, Urine 1.011 1.005 - 1.030   pH 7.0 5.0 - 8.0   Glucose, UA NEGATIVE NEGATIVE mg/dL   Hgb urine dipstick LARGE (A) NEGATIVE   Bilirubin Urine NEGATIVE NEGATIVE   Ketones, ur NEGATIVE NEGATIVE mg/dL   Protein, ur 010 (A) NEGATIVE mg/dL   Nitrite POSITIVE (A) NEGATIVE   Leukocytes,Ua MODERATE (A) NEGATIVE   RBC / HPF >50 0 - 5 RBC/hpf   WBC, UA >50 0 - 5 WBC/hpf    Comment:        Reflex urine culture not performed if WBC <=10, OR if Squamous epithelial cells >5. If Squamous epithelial cells >5 suggest recollection.    Bacteria, UA MANY (A) NONE SEEN   Squamous Epithelial / HPF 0-5 0 - 5 /HPF   WBC Clumps PRESENT    Mucus PRESENT    Non Squamous Epithelial 0-5 (A) NONE SEEN    Comment: Performed at [facility], [address], [place], [place] [address]   DG Chest 2 View Result Date: 07/20/2023 CLINICAL DATA:  Sepsis EXAM: CHEST - 2 VIEW COMPARISON:  03/03/2021 FINDINGS: The heart size  and mediastinal contours are within normal limits. No focal airspace consolidation, pleural effusion, or pneumothorax. The visualized skeletal structures are unremarkable. IMPRESSION: No active cardiopulmonary disease. Electronically Signed   By: Duanne Guess D.O.   On: 07/20/2023 15:12    Pending Labs Unresulted Labs (From admission, onward)     Start     Ordered   07/20/23 1543  RPR  Once,   URGENT        07/20/23 1542   07/20/23 1513  Urine Culture  Once,   R        07/20/23 1513   07/20/23 1310  Pathologist smear review  Once,   STAT        07/20/23 1310   07/20/23 1257  Culture, blood (Routine x 2)  BLOOD CULTURE X 2,   R (with STAT occurrences),   Status:  Canceled      07/20/23 1256            Vitals/Pain Today's Vitals   07/20/23 1625 07/20/23 1730 07/20/23 1731 07/20/23 1732  BP:  105/63  105/63  Pulse: 83 (!) 106 (!) 102 (!) 101  Resp: 17 12 (!) 9 14  Temp:      TempSrc:      SpO2: 100% 100% 100% 100%  PainSc:        Isolation Precautions No active isolations  Medications Medications  acetaminophen (TYLENOL) tablet 650 mg (has no administration in time range)  lactated ringers infusion ( Intravenous New Bag/Given 07/20/23 1514)  linezolid (  ZYVOX) IVPB 600 mg (600 mg Intravenous New Bag/Given 07/20/23 1729)  enoxaparin (LOVENOX) injection 40 mg (has no administration in time range)  metroNIDAZOLE (FLAGYL) IVPB 500 mg (0 mg Intravenous Stopped 07/20/23 1727)  ceFEPIme (MAXIPIME) 2 g in sodium chloride 0.9 % 100 mL IVPB (0 g Intravenous Stopped 07/20/23 1557)    Mobility walks     Focused Assessments    R Recommendations: See Admitting Provider Note  Report given to:   Additional Notes:

## 2023-07-20 NOTE — Progress Notes (Signed)
Id brief note   Admitting hospitalist team called about patient Sepsis nos  Last cd4 count this year <50  Noncompliant with ART  Hx pulm cocci  Relative pancytopenia on labs but norma lft   No ivdu  -a/p Sepsis Aids Pancytopenia  Ddx nonspecific viral syndrome vs other oi such as mac/fungal or endemic fungi relapse   -bcx -ldh -pan ct -respiratory viral pcr full panel -vanc/ceftriaxone -don't restart art/bactrim yet -lab send out for cocci for complement fixation and immunodiffusion (we can send tomorrow) -urine histo ag -blasto ag  -id to formally see tomorrow

## 2023-07-20 NOTE — ED Provider Notes (Signed)
  Physical Exam  BP 96/60 (BP Location: Right Arm)   Pulse 90   Temp (!) 100.5 F (38.1 C) (Oral)   Resp 17   SpO2 99%   Physical Exam  Procedures  Procedures  ED Course / MDM    Medical Decision Making Amount and/or Complexity of Data Reviewed Labs: ordered. Radiology: ordered. ECG/medicine tests: ordered.  Risk OTC drugs. Prescription drug management.   Assuming care of patient from Dr. Rosalia Hammers. Pt comes in febrile with urinary symptoms, burning with urination.   Workup thus far shows neutropenia, SIRS, elevated Cr.  Concerning findings are as following - hx of HIV , non compliance. Important pending results are UA.  According to Dr. Rosalia Hammers, plan is to admit the patient.   Patient had no complains, no concerns from the nursing side. Will continue to monitor.        Derwood Kaplan, MD 07/20/23 [phone]

## 2023-07-21 DIAGNOSIS — N3001 Acute cystitis with hematuria: Secondary | ICD-10-CM

## 2023-07-21 DIAGNOSIS — A419 Sepsis, unspecified organism: Secondary | ICD-10-CM | POA: Diagnosis not present

## 2023-07-21 DIAGNOSIS — N179 Acute kidney failure, unspecified: Secondary | ICD-10-CM | POA: Diagnosis not present

## 2023-07-21 DIAGNOSIS — D72819 Decreased white blood cell count, unspecified: Secondary | ICD-10-CM | POA: Diagnosis not present

## 2023-07-21 LAB — COMPREHENSIVE METABOLIC PANEL
ALT: 11 U/L (ref 0–44)
AST: 19 U/L (ref 15–41)
Albumin: 1.9 g/dL — ABNORMAL LOW (ref 3.5–5.0)
Alkaline Phosphatase: 40 U/L (ref 38–126)
Anion gap: 8 (ref 5–15)
BUN: 13 mg/dL (ref 6–20)
CO2: 19 mmol/L — ABNORMAL LOW (ref 22–32)
Calcium: 7.6 mg/dL — ABNORMAL LOW (ref 8.9–10.3)
Chloride: 103 mmol/L (ref 98–111)
Creatinine, Ser: 1.6 mg/dL — ABNORMAL HIGH (ref 0.61–1.24)
GFR, Estimated: 56 mL/min — ABNORMAL LOW (ref >60)
Glucose, Bld: 140 mg/dL — ABNORMAL HIGH (ref 70–99)
Potassium: 3.9 mmol/L (ref 3.5–5.1)
Sodium: 130 mmol/L — ABNORMAL LOW (ref 135–145)
Total Bilirubin: 0.5 mg/dL (ref <1.2)
Total Protein: 7.7 g/dL (ref 6.5–8.1)

## 2023-07-21 LAB — CBC
HCT: 29.1 % — ABNORMAL LOW (ref 39.0–52.0)
Hemoglobin: 9.7 g/dL — ABNORMAL LOW (ref 13.0–17.0)
MCH: 30.2 pg (ref 26.0–34.0)
MCHC: 33.3 g/dL (ref 30.0–36.0)
MCV: 90.7 fL (ref 80.0–100.0)
Platelets: 120 10*3/uL — ABNORMAL LOW (ref 150–400)
RBC: 3.21 MIL/uL — ABNORMAL LOW (ref 4.22–5.81)
RDW: 15.5 % (ref 11.5–15.5)
WBC: 1.8 10*3/uL — ABNORMAL LOW (ref 4.0–10.5)
nRBC: 0 % (ref 0.0–0.2)

## 2023-07-21 LAB — RESPIRATORY PANEL BY PCR

## 2023-07-21 LAB — BASIC METABOLIC PANEL
Anion gap: 8 (ref 5–15)
BUN: 13 mg/dL (ref 6–20)
CO2: 18 mmol/L — ABNORMAL LOW (ref 22–32)
Calcium: 7.4 mg/dL — ABNORMAL LOW (ref 8.9–10.3)
Chloride: 102 mmol/L (ref 98–111)
Creatinine, Ser: 1.57 mg/dL — ABNORMAL HIGH (ref 0.61–1.24)
GFR, Estimated: 57 mL/min — ABNORMAL LOW (ref >60)
Glucose, Bld: 114 mg/dL — ABNORMAL HIGH (ref 70–99)
Potassium: 3.7 mmol/L (ref 3.5–5.1)
Sodium: 128 mmol/L — ABNORMAL LOW (ref 135–145)

## 2023-07-21 LAB — FOLATE: Folate: 11.5 ng/mL (ref >5.9)

## 2023-07-21 LAB — PATHOLOGIST SMEAR REVIEW

## 2023-07-21 LAB — RPR
RPR Ser Ql: REACTIVE — AB
RPR Titer: 1:16 {titer}

## 2023-07-21 LAB — MRSA NEXT GEN BY PCR, NASAL: MRSA by PCR Next Gen: NOT DETECTED

## 2023-07-21 LAB — T-HELPER CELLS (CD4) COUNT (NOT AT ARMC)
CD4 % Helper T Cell: 2 % — ABNORMAL LOW (ref 33–65)
CD4 T Cell Abs: <35 /uL — ABNORMAL LOW (ref 400–1790)

## 2023-07-21 MED ORDER — ONDANSETRON HCL 4 MG/2ML IJ SOLN
4.0000 mg | Freq: Four times a day (QID) | INTRAMUSCULAR | Status: DC | PRN
  Administered 2023-07-21: 4 mg via INTRAVENOUS
  Filled 2023-07-21: qty 2

## 2023-07-21 MED ORDER — TAMSULOSIN HCL 0.4 MG PO CAPS
0.4000 mg | ORAL_CAPSULE | Freq: Every day | ORAL | Status: DC
  Administered 2023-07-21 – 2023-08-09 (×20): 0.4 mg via ORAL
  Filled 2023-07-21 (×21): qty 1

## 2023-07-21 MED ORDER — SODIUM CHLORIDE 0.9 % IV SOLN
1.0000 g | INTRAVENOUS | Status: DC
  Administered 2023-07-21 – 2023-08-10 (×21): 1 g via INTRAVENOUS
  Filled 2023-07-21 (×21): qty 1000

## 2023-07-21 MED ORDER — LACTATED RINGERS IV SOLN: INTRAVENOUS | Status: AC

## 2023-07-21 MED ORDER — CYANOCOBALAMIN 1000 MCG/ML IJ SOLN
1000.0000 ug | INTRAMUSCULAR | Status: DC
  Administered 2023-07-21 – 2023-08-04 (×3): 1000 ug via INTRAMUSCULAR
  Filled 2023-07-21 (×3): qty 1

## 2023-07-21 MED ORDER — LACTATED RINGERS IV BOLUS
500.0000 mL | Freq: Once | INTRAVENOUS | Status: AC
  Administered 2023-07-21: 500 mL via INTRAVENOUS

## 2023-07-21 NOTE — Plan of Care (Signed)
Patient educated concerning IV antibiotics and antiviral orders.

## 2023-07-21 NOTE — Progress Notes (Addendum)
[facility]                                                                               Jacob Gutierrez, is a 37 y.o. male, DOB - 07-05-1986, OZD:[id number] Admit date - 07/20/2023    Outpatient Primary MD for the patient is [name], [name], MD  LOS - 1  days    Brief summary    Jacob Gutierrez is a 37 y.o. male with medical history significant of HIV/AIDS, medication noncompliance, remote substance abuse, pulmonary coccidiomycosis, syphilis, gonorrhea, monkeypox, ESBL E. coli UTI and HTN who presents to the ED for evaluation of dysuria, fevers and chills. Patient reports he is followed by the [facility] here in [place] but he moved to [place] and has been out of his HIV medications since June/July. He has had a history of UTIs and prostatitis and was recently admitted for sepsis due to UTI last month in [place]. He came to [facility] for the holidays and has continued to have urinary symptoms including difficulty urinating, pain with urination and dark urine. Over the last 3-4 days, he has had intermittent fevers and sweats. Chest x-ray with no active cardiopulmonary disease Sepsis protocol was activated and patient was started on IV fluids and broad-spectrum antibiotics with IV linezolid, cefepime and Flagyl TRH was consulted for admission     Assessment & Plan    Assessment and Plan:   Sepsis secondary to UTI:  - in the setting of immunocompromised state due to Hiv/aids - continue with broad spectrum antibiotics.  - continue with IV fluids.  - follow up cultures.    Pancytopenia:  Suspect from HIV/AIDS Non compliant to meds.  ID consulted.    H/o HIV/AIDS ID consulted and will see patient today.    AKI with metabolic acidosis Creatinine improved from 1.76 to 1.57 with IV fluids.  US renal Mild bilateral hydronephrosis. Somewhat irregular but rather diffuse bladder wall thickening. Recommend to continue with IV fluids.    Anemia from iron deficiency and  vit b12 deficiency Supplementation added.    Respiratory panel positive for corona virus Pt currently denies any sob or cough.    H/o of STD's Pt with h/o treated gonorrhea and syphilis He does report on unprotective sexual intercourse with a male about a month ago. -Repeat RPR -Swab for GC/chlamydia    Estimated body mass index is 24.61 kg/m as calculated from the following:   Height as of this encounter: 6\' 1"  (1.854 m).   Weight as of this encounter: 84.6 kg.  Code Status: full code.  DVT Prophylaxis:  enoxaparin (LOVENOX) injection 40 mg Start: 07/20/23 2200   Level of Care: Level of care: Med-Surg Family Communication: none at bedside.   Disposition Plan:     Remains inpatient appropriate:  IV antibiotics  Procedures:  None.   Consultants:   None.   Antimicrobials:   Anti-infectives (From admission, onward)    Start     Dose/Rate Route Frequency Ordered Stop   07/21/23 1200  ertapenem (INVANZ) 1 g in sodium chloride 0.9 % 100 mL IVPB        1 g 200 mL/hr over 30 Minutes Intravenous Every  24 hours 07/21/23 1101     07/21/23 1000  metroNIDAZOLE (FLAGYL) IVPB 500 mg  Status:  Discontinued        500 mg 100 mL/hr over 60 Minutes Intravenous Every 12 hours 07/20/23 2202 07/21/23 1101   07/21/23 0800  linezolid (ZYVOX) IVPB 600 mg        600 mg 300 mL/hr over 60 Minutes Intravenous Every 12 hours 07/20/23 2202     07/20/23 2300  ceFEPIme (MAXIPIME) 2 g in sodium chloride 0.9 % 100 mL IVPB  Status:  Discontinued        2 g 200 mL/hr over 30 Minutes Intravenous Every 8 hours 07/20/23 2205 07/21/23 1101   07/20/23 1445  linezolid (ZYVOX) IVPB 600 mg        600 mg 300 mL/hr over 60 Minutes Intravenous  Once 07/20/23 1437 07/20/23 1829   07/20/23 1430  aztreonam (AZACTAM) 2 g in sodium chloride 0.9 % 100 mL IVPB  Status:  Discontinued        2 g 200 mL/hr over 30 Minutes Intravenous  Once 07/20/23 1417 07/20/23 1419   07/20/23 1430  metroNIDAZOLE (FLAGYL) IVPB 500  mg        500 mg 100 mL/hr over 60 Minutes Intravenous  Once 07/20/23 1417 07/20/23 1727   07/20/23 1430  ceFEPIme (MAXIPIME) 2 g in sodium chloride 0.9 % 100 mL IVPB        2 g 200 mL/hr over 30 Minutes Intravenous  Once 07/20/23 1419 07/20/23 1557        Medications  Scheduled Meds:  cyanocobalamin  1,000 mcg Intramuscular Weekly   enoxaparin (LOVENOX) injection  40 mg Subcutaneous Q24H   pantoprazole  40 mg Oral Daily   sodium bicarbonate  1,300 mg Oral BID   Continuous Infusions:  ertapenem     linezolid (ZYVOX) IV 600 mg (07/21/23 1025)   PRN Meds:.ondansetron (ZOFRAN) IV    Subjective:   Jacob Gutierrez was seen and examined today.  Pt reports not having any energy . Feels exhausted. No chest pain or sob or nausea. He had one episode of vomiting today.   Objective:   Vitals:   07/20/23 2015 07/20/23 2353 07/21/23 0418 07/21/23 0900  BP: 108/62 (!) 102/57 120/71   Pulse: (!) 104 (!) 107 95   Resp: 18 16 18    Temp: (!) 100.5 F (38.1 C) 99.3 F (37.4 C) 99 F (37.2 C)   TempSrc: Oral Oral Oral   SpO2: 100% 97% 97%   Weight:    84.6 kg  Height:    6\' 1"  (1.854 m)    Intake/Output Summary (Last 24 hours) at 07/21/2023 1106 Last data filed at 07/21/2023 0420 Gross per 24 hour  Intake 460.19 ml  Output 902 ml  Net -441.81 ml   Filed Weights   07/21/23 0900  Weight: 84.6 kg     Exam General exam: Appears calm and comfortable  Respiratory system: Clear to auscultation. Respiratory effort normal. Cardiovascular system: S1 & S2 heard, RRR. Gastrointestinal system: Abdomen is nondistended, soft and nontender. Central nervous system: Alert and oriented. No focal neurological deficits. Extremities: Symmetric 5 x 5 power. Skin: No rashes,  Psychiatry:  Mood & affect appropriate.    Data Reviewed:  I have personally reviewed following labs and imaging studies   CBC Lab Results  Component Value Date   WBC 1.8 (L) 07/21/2023   RBC 3.21 (L)  07/21/2023   HGB 9.7 (L) 07/21/2023   HCT 29.1 (L)  07/21/2023   MCV 90.7 07/21/2023   MCH 30.2 07/21/2023   PLT 120 (L) 07/21/2023   MCHC 33.3 07/21/2023   RDW 15.5 07/21/2023   LYMPHSABS 0.5 (L) 07/20/2023   MONOABS 0.5 07/20/2023   EOSABS 0.1 07/20/2023   BASOSABS 0.0 07/20/2023     Last metabolic panel Lab Results  Component Value Date   NA 128 (L) 07/21/2023   K 3.7 07/21/2023   CL 102 07/21/2023   CO2 18 (L) 07/21/2023   BUN 13 07/21/2023   CREATININE 1.57 (H) 07/21/2023   GLUCOSE 114 (H) 07/21/2023   GFRNONAA 57 (L) 07/21/2023   GFRAA 105 08/25/2020   CALCIUM 7.4 (L) 07/21/2023   PHOS 2.5 10/19/2020   PROT 9.8 (H) 07/20/2023   ALBUMIN 2.7 (L) 07/20/2023   LABGLOB 4.8 (H) 10/08/2020   AGRATIO 0.6 (L) 10/08/2020   BILITOT 1.0 07/20/2023   ALKPHOS 48 07/20/2023   AST 20 07/20/2023   ALT 16 07/20/2023   ANIONGAP 8 07/21/2023    CBG (last 3)  No results for input(s): "GLUCAP" in the last 72 hours.    Coagulation Profile: Recent Labs  Lab 07/20/23 1310  INR 1.2     Radiology Studies: US RENAL Result Date: 07/20/2023 CLINICAL DATA:  Hematuria EXAM: RENAL / URINARY TRACT ULTRASOUND COMPLETE COMPARISON:  CT 12/08/2021 FINDINGS: Right Kidney: Renal measurements: 13.2 x 6.1 x 5.2 cm = volume: 220 mL. Mild right hydronephrosis. No mass. Normal echotexture. Left Kidney: Renal measurements: 14.2 x 7.1 x 5.5 cm = volume: 291 mL. Mild left hydronephrosis. No mass. Normal echotexture. Bladder: Layering debris within the bladder. Bladder wall diffusely but somewhat irregularly thickened. Other: None. IMPRESSION: Mild bilateral hydronephrosis. Somewhat irregular but rather diffuse bladder wall thickening. This may be related to cystitis. Recommend clinical correlation. Electronically Signed   By: Charlett Nose M.D.   On: 07/20/2023 23:23   DG Chest 2 View Result Date: 07/20/2023 CLINICAL DATA:  Sepsis EXAM: CHEST - 2 VIEW COMPARISON:  03/03/2021 FINDINGS: The heart size  and mediastinal contours are within normal limits. No focal airspace consolidation, pleural effusion, or pneumothorax. The visualized skeletal structures are unremarkable. IMPRESSION: No active cardiopulmonary disease. Electronically Signed   By: Duanne Guess D.O.   On: 07/20/2023 15:12       Kathlen Mody M.D. [facility] 07/21/2023, 11:06 AM  Available via Epic secure chat 7am-7pm After 7 pm, please refer to night coverage provider listed on amion.

## 2023-07-21 NOTE — Consult Note (Addendum)
[facility]                                                                                        Patient Identification: Patient Name: Jacob Gutierrez MRN: [id number] Admit Date: 07/20/2023 12:43 PM Today's Date: 07/21/2023 Reason for consult: AIDS Requesting provider: EDP  Active Problems:   Urinary tract infection with hematuria   Pancytopenia (HCC)   AKI (acute kidney injury) (HCC)   Sepsis (HCC)   Neutropenia with fever (HCC)   Symptomatic HIV infection (HCC)   Febrile illness   Leukopenia   Antibiotics:  Linezolid 12/26 Cefepime 12/26 Metronidazole 12/26   Lines/Hardware:  12/26 crypto ag negative   Assessment # Fevers  # Long h/o difficulty with urination ( h/o TURP) with recent increased frequency/hematuria a/w fevers # RVP positive for corona virus OC43   - Fevers could be viral vs bacterial UTI. He has h/o frequent UTIs in the past with ESBL E coli. Given his AIDs, not on treatment, will also however need to concurrently consider other opportunistic infections, fungal, MAC  # HIV/AIDs - not on tx since June/July after moving out from [place] - He was supposed to be on Symtuza and bactrim when last seen in 09/2022 and was uncontrolled with CD4 less than 35 Lab Results  Component Value Date   HIV1RNAQUANT 447,000 (H) 10/18/2022   Lab Results  Component Value Date   CD4TABS <35 (L) 10/18/2022   CD4TABS 184 (L) 12/15/2021   CD4TABS 115 (L) 07/27/2021    # Syphilis  - 10/18/22 RPR 1: 24, s/p one dose of Benzathine Pen G on 10/26/22  # Loss of appetite/weight loss - AIDS related cachexia vs others   # Pancytopenia - could be AIDS related vs others  - normal liver enzymes, ANC 800 12/26  # Hyponatremia/AKI/metabolic acidosis - in the setting of poor PO intake - Primary managing  # Pulmonary coccidioidomycosis - He was surprised to hear about this diagnosis  from past   Recommendations  - will change cefepime to ertapenem to cover possible ESBL E coli UTI. DC metronidazole  - continue linezolid pending blood cx to be negative in 2 days and possibly DC there after - Fu blood cx and urine cultures - Fu urine GC ( has not been collected), RPR - Ordered AFB blood cx, fu blastomyces ag, histoplasma ag ( not collected) and Coccidiodomycosis send out test  - Would get CT CAP once Cr normalizes - ART and PJP ppx yet to be started  - Wound benefit from Urology evaluation given chronic h/o difficulty with urination - Following peripherally over the weekend  Rest of the management as per the primary team. Please call with questions or concerns.  Thank you for the consult  __________________________________________________________________________________________________________ HPI and Hospital Course: 37 year old male with PMH of HIV/AIDS, noncompliant to ART since June/July 2024 after moving from [place] to [place], pulmonary coccidioidomycosis, Cryptosporidium gastroenteritis, Shigells gastroenteritis monkeypox, syphilis, gonorrhea, anal dysplasia, remote substance use, strep pyogenes bacteremia, history of prior UTIs/Bacteremia with ESBL E. coli colonization,  who presented to the ED with dysuria, hematuria and flank pain right greater  than left.  Reportedly he had increasing urinary frequency as well as fevers and chills day prior to ED presentation.   Reports difficulty with urination for a long time , was using outpatient azo, sometimes eases off but not fully gone. He describes difficulty with urination as sensation of " the water hose continue to flow even if turned off and muscles contracting to squeeze the urine out".  Also feels some burning when urinating. Last sexual encounter was with a male 1.5 months ago and was unprotected. He was living with his sister in [place], unemployed, had 2 dogs. He came to [facility] for holidays but reports everyone  in the gathering was masked. He is unsure about his plans to move back to [place] with his current illness.  Reports appetite has not been good, denies abdominal pain. Bms are irregular and also used miralax at one point as he was not having Bms but then he reports he was not eating well to " poop" out.  Reports weight is down from 215 pounds to approx 180 pounds reports he has regular headache  with no recent changes.  He feels sensation of winding when walking. denies blurry vision, neck pain. Denies cough, chest pain and SOB. Denies rashes or joint pain.  Denies any penile discharge as such.Reportedly he was admitted for sepsis due to UTI last month in [place]  At ED febrile Tmax 103, mildly tachypneic, tachycardic SpO2 98% on room air Labs remarkable for NA 129, AKI with creatinine 1.76, albumin 2.7.  WBC 1.9, ANC 800, Hb 10.7, platelets 130 12/26 blood cultures no growth to date, UA cloudy, large hemoglobin, moderate leukocytes, positive nitrites, urine culture pending 12/26 RVP positive for coronavirus OCP43  Started on IVF and broad-spectrum antibiotics with IV linezolid, cefepime and metronidazole  12/26 US renal -mild bilateral hydronephrosis.  Somewhat irregular and but rather diffuse bladder wall thickening.  This may be related to cystitis.   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 [facility])    H/O coccidioidomycosis    pulmonary    HIV disease (HCC)    Human monkeypox    Past history of allergy to penicillin-type antibiotic 07-03-2012   desensitization   PNA (pneumonia)    Shigella gastroenteritis    Syphilis    history /treated    Past Surgical History:  Procedure Laterality Date   BIOPSY  10/16/2020   Procedure: BIOPSY;  Surgeon: Lemar Lofty., MD;  Location: [facility] ENDOSCOPY;  Service:  Gastroenterology;;   BIOPSY  10/23/2020   Procedure: BIOPSY;  Surgeon: Lynann Bologna, MD;  Location: [facility] ENDOSCOPY;  Service: Endoscopy;;   COLONOSCOPY WITH PROPOFOL N/A 10/23/2020   Procedure: COLONOSCOPY WITH PROPOFOL;  Surgeon: Lynann Bologna, MD;  Location: [facility] ENDOSCOPY;  Service: Endoscopy;  Laterality: N/A;   ESOPHAGOGASTRODUODENOSCOPY (EGD) WITH PROPOFOL N/A 10/16/2020   Procedure: ESOPHAGOGASTRODUODENOSCOPY (EGD) WITH PROPOFOL;  Surgeon: Meridee Score Netty Starring., MD;  Location: [facility] ENDOSCOPY;  Service: Gastroenterology;  Laterality: N/A;   TRANSURETHRAL RESECTION OF PROSTATE N/A 12/09/2017   Procedure: TRANSURETHRAL RESECTION OF THE PROSTATE (TURP);  Surgeon: Crist Fat, MD;  Location: [facility] ORS;  Service: Urology;  Laterality: N/A;     Scheduled Meds:  enoxaparin (LOVENOX) injection  40 mg Subcutaneous Q24H   pantoprazole  40 mg Oral Daily   sodium bicarbonate  1,300  mg Oral BID   Continuous Infusions:  ceFEPime (MAXIPIME) IV 2 g (07/21/23 0622)   lactated ringers 150 mL/hr at 07/21/23 0981   linezolid (ZYVOX) IV     metronidazole     PRN Meds:.ondansetron (ZOFRAN) IV  Allergies  Allergen Reactions   Vancomycin Anaphylaxis, Itching, Swelling and Other (See Comments)    Angioedema and "everything swells"   Amoxicillin Other (See Comments)    From childhood: "I had a reaction when i was little." (??) Has tolerated multiple cephalosporins in the past   Social History   Socioeconomic History   Marital status: Single    Spouse name: Not on file   Number of children: Not on file   Years of education: Not on file   Highest education level: Not on file  Occupational History   Not on file  Tobacco Use   Smoking status: Every Day    Current packs/day: 0.30    Types: Cigarettes, E-cigarettes   Smokeless tobacco: Never   Tobacco comments:    States he is trying to cut back, goes through a pack of cigarettes in about a week  Vaping Use   Vaping status: Never Used   Substance and Sexual Activity   Alcohol use: Yes    Alcohol/week: 1.0 standard drink of alcohol    Types: 1 Standard drinks or equivalent per week    Comment: whiskey occasionally    Drug use: Yes    Frequency: 1.0 times per week    Types: Marijuana    Comment: socially   Sexual activity: Yes    Partners: Female, Male    Birth control/protection: Condom    Comment: condoms given  Other Topics Concern   Not on file  Social History Narrative   Not on file   Social Drivers of Health   Financial Resource Strain: Low Risk  (06/09/2018)   Overall Financial Resource Strain (CARDIA)    Difficulty of Paying Living Expenses: Not very hard  Food Insecurity: No Food Insecurity (07/21/2023)   Hunger Vital Sign    Worried About Running Out of Food in the Last Year: Never true    Ran Out of Food in the Last Year: Never true  Transportation Needs: No Transportation Needs (06/20/2023)   Received from [facility]   PRAPARE - Transportation    Lack of Transportation (Medical): No    Lack of Transportation (Non-Medical): No  Physical Activity: Unknown (06/09/2018)   Exercise Vital Sign    Days of Exercise per Week: Patient declined    Minutes of Exercise per Session: Patient declined  Stress: Not on file  Social Connections: Unknown (06/09/2018)   Social Connection and Isolation Panel [NHANES]    Frequency of Communication with Friends and Family: Patient declined    Frequency of Social Gatherings with Friends and Family: Patient declined    Attends Religious Services: Patient declined    Database administrator or Organizations: Patient declined    Attends Banker Meetings: Patient declined    Marital Status: Patient declined  Intimate Partner Violence: Not At Risk (06/18/2023)   Received from [facility]   Abuse Indicators    Alleged/Suspected/Actual Abuse, Neglect, Violence, Exploitation: No   Family History  Problem Relation Age of Onset    Hypertension Mother    Lupus Maternal Grandmother    Diabetes Maternal Grandmother    Cancer Paternal Grandfather        unknown    Prostate cancer Father  Diabetes Father    Diabetes Maternal Grandfather    Colon cancer Neg Hx    Stomach cancer Neg Hx    Esophageal cancer Neg Hx    Pancreatic cancer Neg Hx     Vitals BP 120/71 (BP Location: Left Arm)   Pulse 95   Temp 99 F (37.2 C) (Oral)   Resp 18   SpO2 97%   Physical Exam Constitutional:  appears older than stated age, prominent facial bones    Comments: HEENT wnl, no thrush  Cardiovascular:     Rate and Rhythm: Normal rate and regular rhythm.     Heart sounds: s1s2  Pulmonary:     Effort: Pulmonary effort is normal.     Comments: Normal breath sounds   Abdominal:     Palpations: Abdomen is soft.     Tenderness: non distended and non tender   Musculoskeletal:        General: No swelling or tenderness in peripheral joints  Skin:    Comments: chronic appearing healed lesions in the left UE and posterior neck area which he attributes to picking     Neurological:     General: awake, alert and oriented, following commands   Psychiatric:        Mood and Affect: Mood normal.    Pertinent Microbiology Results for orders placed or performed during the hospital encounter of 07/20/23  Culture, blood (Routine x 2)     Status: None (Preliminary result)   Collection Time: 07/20/23  1:10 PM   Specimen: BLOOD LEFT HAND  Result Value Ref Range Status   Specimen Description BLOOD LEFT HAND  Final   Special Requests   Final    BOTTLES DRAWN AEROBIC AND ANAEROBIC Blood Culture results may not be optimal due to an inadequate volume of blood received in culture bottles   Culture   Final    NO GROWTH < 24 HOURS Performed at [facility], [address] [place], [place] [address]    Report Status PENDING  Incomplete  Urine Culture     Status: None (Preliminary result)   Collection Time: 07/20/23  3:13 PM    Specimen: Urine, Clean Catch  Result Value Ref Range Status   Specimen Description URINE, CLEAN CATCH  Final   Special Requests   Final    NONE Reflexed from [id number] Performed at [facility], [address] [place], [place] [address]    Culture PENDING  Incomplete   Report Status PENDING  Incomplete  Respiratory (~20 pathogens) panel by PCR     Status: Abnormal   Collection Time: 07/20/23  6:06 PM   Specimen: Nasopharyngeal Swab; Respiratory  Result Value Ref Range Status   Adenovirus NOT DETECTED NOT DETECTED Final   Coronavirus 229E NOT DETECTED NOT DETECTED Final    Comment: (NOTE) The Coronavirus on the Respiratory Panel, DOES NOT test for the novel  Coronavirus (2019 nCoV)    Coronavirus HKU1 NOT DETECTED NOT DETECTED Final   Coronavirus NL63 NOT DETECTED NOT DETECTED Final   Coronavirus OC43 DETECTED (A) NOT DETECTED Final   Metapneumovirus NOT DETECTED NOT DETECTED Final   Rhinovirus / Enterovirus NOT DETECTED NOT DETECTED Final   Influenza A NOT DETECTED NOT DETECTED Final   Influenza B NOT DETECTED NOT DETECTED Final   Parainfluenza Virus 1 NOT DETECTED NOT DETECTED Final   Parainfluenza Virus 2 NOT DETECTED NOT DETECTED Final   Parainfluenza Virus 3 NOT DETECTED NOT DETECTED Final   Parainfluenza  Virus 4 NOT DETECTED NOT DETECTED Final   Respiratory Syncytial Virus NOT DETECTED NOT DETECTED Final   Bordetella pertussis NOT DETECTED NOT DETECTED Final   Bordetella Parapertussis NOT DETECTED NOT DETECTED Final   Chlamydophila pneumoniae NOT DETECTED NOT DETECTED Final   Mycoplasma pneumoniae NOT DETECTED NOT DETECTED Final    Comment: Performed at [facility], [address], [place], [place] [address]    Pertinent Lab seen by me:    Latest Ref Rng & Units 07/20/2023    1:10 PM 12/15/2021    2:23 PM 11/15/2021    2:28 PM  CBC  WBC 4.0 - 10.5 K/uL 1.9  5.1  7.6   Hemoglobin 13.0 - 17.0 g/dL 66.4  40.3  47.4   Hematocrit 39.0 - 52.0 % 31.8  36.1  38.4    Platelets 150 - 400 K/uL 130  230  285       Latest Ref Rng & Units 07/20/2023    1:10 PM 10/18/2022   11:02 AM 12/15/2021    2:23 PM  CMP  Glucose 70 - 99 mg/dL 86  88  259   BUN 6 - 20 mg/dL 17  16  21    Creatinine 0.61 - 1.24 mg/dL 5.63  8.75  6.43   Sodium 135 - 145 mmol/L 129  137  134   Potassium 3.5 - 5.1 mmol/L 3.9  3.9  3.5   Chloride 98 - 111 mmol/L 101  106  102   CO2 22 - 32 mmol/L 19  23  22    Calcium 8.9 - 10.3 mg/dL 8.2  8.3  9.1   Total Protein 6.5 - 8.1 g/dL 9.8  8.4  9.1   Total Bilirubin <1.2 mg/dL 1.0  0.4  0.8   Alkaline Phos 38 - 126 U/L 48     AST 15 - 41 U/L 20  19  33   ALT 0 - 44 U/L 16  12  17       Pertinent Imagings/Other Imagings Plain films and CT images have been personally visualized and interpreted; radiology reports have been reviewed. Decision making incorporated into the Impression / Recommendations.  US RENAL Result Date: 07/20/2023 CLINICAL DATA:  Hematuria EXAM: RENAL / URINARY TRACT ULTRASOUND COMPLETE COMPARISON:  CT 12/08/2021 FINDINGS: Right Kidney: Renal measurements: 13.2 x 6.1 x 5.2 cm = volume: 220 mL. Mild right hydronephrosis. No mass. Normal echotexture. Left Kidney: Renal measurements: 14.2 x 7.1 x 5.5 cm = volume: 291 mL. Mild left hydronephrosis. No mass. Normal echotexture. Bladder: Layering debris within the bladder. Bladder wall diffusely but somewhat irregularly thickened. Other: None. IMPRESSION: Mild bilateral hydronephrosis. Somewhat irregular but rather diffuse bladder wall thickening. This may be related to cystitis. Recommend clinical correlation. Electronically Signed   By: Charlett Nose M.D.   On: 07/20/2023 23:23   DG Chest 2 View Result Date: 07/20/2023 CLINICAL DATA:  Sepsis EXAM: CHEST - 2 VIEW COMPARISON:  03/03/2021 FINDINGS: The heart size and mediastinal contours are within normal limits. No focal airspace consolidation, pleural effusion, or pneumothorax. The visualized skeletal structures are unremarkable.  IMPRESSION: No active cardiopulmonary disease. Electronically Signed   By: Duanne Guess D.O.   On: 07/20/2023 15:12    I have personally spent 96 minutes involved in face-to-face and non-face-to-face activities for this patient on the day of the visit. Professional time spent includes the following activities: Preparing to see the patient (review of tests), Obtaining and/or reviewing separately obtained history (admission/discharge record), Performing a medically  appropriate examination and/or evaluation , Ordering medications/tests/procedures, referring and communicating with other health care professionals, Documenting clinical information in the EMR, Independently interpreting results (not separately reported), Communicating results to the patient/family/caregiver, Counseling and educating the patient/family/caregiver and Care coordination (not separately reported).  Electronically signed by:   Plan d/w requesting provider as well as ID pharm D  Of note, portions of this note may have been created with voice recognition software. While this note has been edited for accuracy, occasional wrong-word or 'sound-a-like' substitutions may have occurred due to the inherent limitations of voice recognition software.   Odette Fraction, MD Infectious Disease Physician [facility] Pager: [phone]

## 2023-07-21 NOTE — Progress Notes (Addendum)
   07/20/23 2015  Vitals  Temp (!) 100.5 F (38.1 C)  Temp Source Oral  BP 108/62  MAP (mmHg) 76  Pulse Rate (!) 104  Resp 18  Level of Consciousness  Level of Consciousness Alert  MEWS COLOR  MEWS Score Color Yellow  Oxygen Therapy  SpO2 100 %  O2 Device Room Air  Pain Assessment  Pain Scale 0-10  Pain Score 0  PCA/Epidural/Spinal Assessment  Respiratory Pattern Regular;Unlabored  Glasgow Coma Scale  Eye Opening 4  Best Verbal Response (NON-intubated) 5  Best Motor Response 6  Glasgow Coma Scale Score 15  MEWS Score  MEWS Temp 1  MEWS Systolic 0  MEWS Pulse 1  MEWS RR 0  MEWS LOC 0  MEWS Score 2  Provider Notification  Provider Name/Title Steffanie Rainwater MD  Date Provider Notified 07/20/23  Time Provider Notified 2350  Method of Notification Page  Notification Reason Other (Comment) (yellow MEW)  Provider response No new orders  Date of Provider Response 07/21/23  Time of Provider Response 0010

## 2023-07-21 NOTE — Plan of Care (Signed)
  Problem: Education: Goal: Knowledge of General Education information will improve Description: Including pain rating scale, medication(s)/side effects and non-pharmacologic comfort measures Outcome: Progressing   Problem: Clinical Measurements: Goal: Ability to maintain clinical measurements within normal limits will improve Outcome: Progressing Goal: Will remain free from infection Outcome: Progressing Goal: Diagnostic test results will improve Outcome: Progressing Goal: Cardiovascular complication will be avoided Outcome: Progressing   Problem: Activity: Goal: Risk for activity intolerance will decrease Outcome: Progressing   Problem: Nutrition: Goal: Adequate nutrition will be maintained Outcome: Progressing   Problem: Coping: Goal: Level of anxiety will decrease Outcome: Progressing

## 2023-07-21 NOTE — Progress Notes (Signed)
   07/21/23 1449  Assess: MEWS Score  Temp 98.2 F (36.8 C)  BP (!) 92/56  MAP (mmHg) 67  Pulse Rate (!) 104  Resp 18  SpO2 100 %  O2 Device Room Air  Assess: MEWS Score  MEWS Temp 0  MEWS Systolic 1  MEWS Pulse 1  MEWS RR 0  MEWS LOC 0  MEWS Score 2  MEWS Score Color Yellow  Assess: if the MEWS score is Yellow or Red  Were vital signs accurate and taken at a resting state? Yes  Does the patient meet 2 or more of the SIRS criteria? No  MEWS guidelines implemented  Yes, yellow  Treat  MEWS Interventions Considered administering scheduled or prn medications/treatments as ordered  Take Vital Signs  Increase Vital Sign Frequency  Yellow: Q2hr x1, continue Q4hrs until patient remains green for 12hrs  Escalate  MEWS: Escalate Yellow: Discuss with charge nurse and consider notifying provider and/or RRT  Provider Notification  Provider Name/Title Blake Divine, MD  Date Provider Notified 07/21/23  Time Provider Notified 1456  Method of Notification Page  Notification Reason Other (Comment) (yellow MEWS)  Assess: SIRS CRITERIA  SIRS Temperature  0  SIRS Respirations  0  SIRS Pulse 1  SIRS WBC 0  SIRS Score Sum  1

## 2023-07-22 DIAGNOSIS — A419 Sepsis, unspecified organism: Secondary | ICD-10-CM | POA: Diagnosis not present

## 2023-07-22 DIAGNOSIS — N3001 Acute cystitis with hematuria: Secondary | ICD-10-CM | POA: Diagnosis not present

## 2023-07-22 DIAGNOSIS — N179 Acute kidney failure, unspecified: Secondary | ICD-10-CM | POA: Diagnosis not present

## 2023-07-22 DIAGNOSIS — D72819 Decreased white blood cell count, unspecified: Secondary | ICD-10-CM | POA: Diagnosis not present

## 2023-07-22 LAB — COMPREHENSIVE METABOLIC PANEL
ALT: 11 U/L (ref 0–44)
AST: 16 U/L (ref 15–41)
Albumin: 1.9 g/dL — ABNORMAL LOW (ref 3.5–5.0)
Alkaline Phosphatase: 39 U/L (ref 38–126)
Anion gap: 5 (ref 5–15)
BUN: 10 mg/dL (ref 6–20)
CO2: 22 mmol/L (ref 22–32)
Calcium: 7.6 mg/dL — ABNORMAL LOW (ref 8.9–10.3)
Chloride: 104 mmol/L (ref 98–111)
Creatinine, Ser: 1.49 mg/dL — ABNORMAL HIGH (ref 0.61–1.24)
GFR, Estimated: >60 mL/min (ref >60)
Glucose, Bld: 88 mg/dL (ref 70–99)
Potassium: 3.7 mmol/L (ref 3.5–5.1)
Sodium: 131 mmol/L — ABNORMAL LOW (ref 135–145)
Total Bilirubin: 0.5 mg/dL (ref <1.2)
Total Protein: 7.2 g/dL (ref 6.5–8.1)

## 2023-07-22 LAB — CBC WITH DIFFERENTIAL/PLATELET
Abs Immature Granulocytes: 0 10*3/uL (ref 0.00–0.07)
Basophils Absolute: 0 10*3/uL (ref 0.0–0.1)
Basophils Relative: 2 %
Eosinophils Absolute: 0 10*3/uL (ref 0.0–0.5)
Eosinophils Relative: 2 %
HCT: 25.3 % — ABNORMAL LOW (ref 39.0–52.0)
Hemoglobin: 8.5 g/dL — ABNORMAL LOW (ref 13.0–17.0)
Lymphocytes Relative: 2 %
Lymphs Abs: 0 10*3/uL — ABNORMAL LOW (ref 0.7–4.0)
MCH: 30.5 pg (ref 26.0–34.0)
MCHC: 33.6 g/dL (ref 30.0–36.0)
MCV: 90.7 fL (ref 80.0–100.0)
Monocytes Absolute: 0.1 10*3/uL (ref 0.1–1.0)
Monocytes Relative: 9 %
Neutro Abs: 1.2 10*3/uL — ABNORMAL LOW (ref 1.7–7.7)
Neutrophils Relative %: 85 %
Platelets: 118 10*3/uL — ABNORMAL LOW (ref 150–400)
RBC: 2.79 MIL/uL — ABNORMAL LOW (ref 4.22–5.81)
RDW: 15.5 % (ref 11.5–15.5)
WBC: 1.4 10*3/uL — CL (ref 4.0–10.5)
nRBC: 0 % (ref 0.0–0.2)
nRBC: 0 /100{WBCs}

## 2023-07-22 LAB — SODIUM, URINE, RANDOM: Sodium, Ur: 54 mmol/L

## 2023-07-22 LAB — HIV-1 RNA QUANT-NO REFLEX-BLD
HIV 1 RNA Quant: 1180000 {copies}/mL
LOG10 HIV-1 RNA: 6.072 {Log}

## 2023-07-22 LAB — MAGNESIUM: Magnesium: 1.8 mg/dL (ref 1.7–2.4)

## 2023-07-22 MED ORDER — FERROUS SULFATE 325 (65 FE) MG PO TABS
325.0000 mg | ORAL_TABLET | Freq: Two times a day (BID) | ORAL | Status: DC
  Administered 2023-07-22 – 2023-08-10 (×38): 325 mg via ORAL
  Filled 2023-07-22 (×38): qty 1

## 2023-07-22 MED ORDER — MIDODRINE HCL 5 MG PO TABS
5.0000 mg | ORAL_TABLET | Freq: Three times a day (TID) | ORAL | Status: DC
  Administered 2023-07-22 – 2023-08-09 (×55): 5 mg via ORAL
  Filled 2023-07-22 (×55): qty 1

## 2023-07-22 NOTE — Progress Notes (Addendum)
[facility]                                                                               Jacob Gutierrez, is a 37 y.o. male, DOB - [birth year], [id number]:[id number] Admit date - 07/20/2023    Outpatient Primary MD for the patient is [name], Jacob Heman, MD  LOS - 2  days    Brief summary    Jacob Gutierrez is a 37 y.o. male with medical history significant of HIV/AIDS, medication noncompliance, remote substance abuse, pulmonary coccidiomycosis, syphilis, gonorrhea, monkeypox, ESBL E. coli UTI and HTN who presents to the ED for evaluation of dysuria, fevers and chills. Patient reports he is followed by the [facility] here in [place] but he moved to [place] and has been out of his HIV medications since June/July. He has had a history of UTIs and prostatitis and was recently admitted for sepsis due to UTI last month in [place]. He came to [facility] for the holidays and has continued to have urinary symptoms including difficulty urinating, pain with urination and dark urine. Over the last 3-4 days, he has had intermittent fevers and sweats. Chest x-ray with no active cardiopulmonary disease Sepsis protocol was activated and patient was started on IV fluids and broad-spectrum antibiotics with IV linezolid, cefepime and Flagyl TRH was consulted for admission     Assessment & Plan    Assessment and Plan:   Sepsis secondary to UTI:  - in the setting of immunocompromised state due to Hiv/aids and non compliance to meds.  - continue with broad spectrum antibiotics. Currently on Ertapenem , linezolid  - continue with IV fluids.  - urine cultures growing 1,00 000 gram negative .   Pancytopenia:  Suspect from HIV/AIDS Non compliant to meds.  ID consulted.    H/o HIV/AIDS ID consulted  and recommendations given.  CD4  t CELL LESS THAN 35, Helper T cell around 2.  RPR titer is 1:16. Cryptococcal antigen is negative. Blastomyces antigen pending.  Respiratory panel is positive for  coronovirus.    AKI with metabolic acidosis Creatinine improved from 1.76 to 1.57  to 1.49 with IV fluids. Suspect he has some underlying CKD  US renal Mild bilateral hydronephrosis. Somewhat irregular but rather diffuse bladder wall thickening. Recommend outpatient follow up with Urology on discharge.  Adequate urine output .  Added fomax.   Hypotension:  Despite adequate hydration.  Check orthostatics.  Check am cortisol level.  Add midodrine and monitor BP parameters.   Anemia from iron deficiency and vit b12 deficiency Supplementation added.    Hyponatremia:  Initially thought to be secondary to dehydration and sepsis, improving slowly.  Get TSH, am cortisol, urine sodium and serum osmo   Respiratory panel positive for corona virus Pt currently denies any sob or cough.    H/o of STD's Pt with h/o treated gonorrhea and syphilis He does report on unprotective sexual intercourse with a male about a month ago. -Swab for GC/chlamydia     Estimated body mass index is 24.29 kg/m as calculated from the following:   Height as of this encounter: 6\' 1"  (1.854 m).   Weight as of this encounter: 83.5 kg.  Code Status: full code.  DVT Prophylaxis:  enoxaparin (LOVENOX) injection 40 mg Start: 07/20/23 2200   Level of Care: Level of care: Med-Surg Family Communication: none at bedside.   Disposition Plan:     Remains inpatient appropriate:  IV antibiotics  Procedures:  None.   Consultants:   None.   Antimicrobials:   Anti-infectives (From admission, onward)    Start     Dose/Rate Route Frequency Ordered Stop   07/21/23 1200  ertapenem (INVANZ) 1 g in sodium chloride 0.9 % 100 mL IVPB        1 g 200 mL/hr over 30 Minutes Intravenous Every 24 hours 07/21/23 1101     07/21/23 1000  metroNIDAZOLE (FLAGYL) IVPB 500 mg  Status:  Discontinued        500 mg 100 mL/hr over 60 Minutes Intravenous Every 12 hours 07/20/23 2202 07/21/23 1101   07/21/23 0800  linezolid  (ZYVOX) IVPB 600 mg        600 mg 300 mL/hr over 60 Minutes Intravenous Every 12 hours 07/20/23 2202     07/20/23 2300  ceFEPIme (MAXIPIME) 2 g in sodium chloride 0.9 % 100 mL IVPB  Status:  Discontinued        2 g 200 mL/hr over 30 Minutes Intravenous Every 8 hours 07/20/23 2205 07/21/23 1101   07/20/23 1445  linezolid (ZYVOX) IVPB 600 mg        600 mg 300 mL/hr over 60 Minutes Intravenous  Once 07/20/23 1437 07/20/23 1829   07/20/23 1430  aztreonam (AZACTAM) 2 g in sodium chloride 0.9 % 100 mL IVPB  Status:  Discontinued        2 g 200 mL/hr over 30 Minutes Intravenous  Once 07/20/23 1417 07/20/23 1419   07/20/23 1430  metroNIDAZOLE (FLAGYL) IVPB 500 mg        500 mg 100 mL/hr over 60 Minutes Intravenous  Once 07/20/23 1417 07/20/23 1727   07/20/23 1430  ceFEPIme (MAXIPIME) 2 g in sodium chloride 0.9 % 100 mL IVPB        2 g 200 mL/hr over 30 Minutes Intravenous  Once 07/20/23 1419 07/20/23 1557        Medications  Scheduled Meds:  cyanocobalamin  1,000 mcg Intramuscular Weekly   enoxaparin (LOVENOX) injection  40 mg Subcutaneous Q24H   ferrous sulfate  325 mg Oral BID WC   midodrine  5 mg Oral TID WC   pantoprazole  40 mg Oral Daily   sodium bicarbonate  1,300 mg Oral BID   tamsulosin  0.4 mg Oral QPC supper   Continuous Infusions:  ertapenem 1 g (07/22/23 1136)   linezolid (ZYVOX) IV 600 mg (07/22/23 0941)   PRN Meds:.ondansetron (ZOFRAN) IV    Subjective:   Surender Watry was seen and examined today.  Reports feeling much better.  No new complaints.   Objective:   Vitals:   07/21/23 1602 07/21/23 2053 07/22/23 0429 07/22/23 0906  BP: 93/61 107/64 (!) 93/55 (!) 85/49  Pulse: (!) 103 99 85 81  Resp: 18 17  18   Temp: 98.9 F (37.2 C) 99.5 F (37.5 C) 98.5 F (36.9 C) 98.1 F (36.7 C)  TempSrc: Oral  Oral Oral  SpO2: 100% 100% 98% 99%  Weight:      Height:        Intake/Output Summary (Last 24 hours) at 07/22/2023 1257 Last data filed at 07/22/2023  0650 Gross per 24 hour  Intake 1908.75 ml  Output 1200 ml  Net  708.75 ml   Filed Weights   07/21/23 0900 07/21/23 1528  Weight: 84.6 kg 83.5 kg     Exam General exam: Appears calm and comfortable  Respiratory system: Clear to auscultation. Respiratory effort normal. Cardiovascular system: S1 & S2 heard, RRR. No JVD,  Gastrointestinal system: Abdomen is nondistended, soft and nontender.  Central nervous system: Alert and oriented. No focal neurological deficits. Extremities: Symmetric 5 x 5 power. Skin: No rashes, Psychiatry: Mood & affect appropriate.     Data Reviewed:  I have personally reviewed following labs and imaging studies   CBC Lab Results  Component Value Date   WBC 1.4 (LL) 07/22/2023   RBC 2.79 (L) 07/22/2023   HGB 8.5 (L) 07/22/2023   HCT 25.3 (L) 07/22/2023   MCV 90.7 07/22/2023   MCH 30.5 07/22/2023   PLT 118 (L) 07/22/2023   MCHC 33.6 07/22/2023   RDW 15.5 07/22/2023   LYMPHSABS 0.0 (L) 07/22/2023   MONOABS 0.1 07/22/2023   EOSABS 0.0 07/22/2023   BASOSABS 0.0 07/22/2023     Last metabolic panel Lab Results  Component Value Date   NA 131 (L) 07/22/2023   K 3.7 07/22/2023   CL 104 07/22/2023   CO2 22 07/22/2023   BUN 10 07/22/2023   CREATININE 1.49 (H) 07/22/2023   GLUCOSE 88 07/22/2023   GFRNONAA >60 07/22/2023   GFRAA 105 08/25/2020   CALCIUM 7.6 (L) 07/22/2023   PHOS 2.5 10/19/2020   PROT 7.2 07/22/2023   ALBUMIN 1.9 (L) 07/22/2023   LABGLOB 4.8 (H) 10/08/2020   AGRATIO 0.6 (L) 10/08/2020   BILITOT 0.5 07/22/2023   ALKPHOS 39 07/22/2023   AST 16 07/22/2023   ALT 11 07/22/2023   ANIONGAP 5 07/22/2023    CBG (last 3)  No results for input(s): "GLUCAP" in the last 72 hours.    Coagulation Profile: Recent Labs  Lab 07/20/23 1310  INR 1.2     Radiology Studies: US RENAL Result Date: 07/20/2023 CLINICAL DATA:  Hematuria EXAM: RENAL / URINARY TRACT ULTRASOUND COMPLETE COMPARISON:  CT 12/08/2021 FINDINGS: Right Kidney:  Renal measurements: 13.2 x 6.1 x 5.2 cm = volume: 220 mL. Mild right hydronephrosis. No mass. Normal echotexture. Left Kidney: Renal measurements: 14.2 x 7.1 x 5.5 cm = volume: 291 mL. Mild left hydronephrosis. No mass. Normal echotexture. Bladder: Layering debris within the bladder. Bladder wall diffusely but somewhat irregularly thickened. Other: None. IMPRESSION: Mild bilateral hydronephrosis. Somewhat irregular but rather diffuse bladder wall thickening. This may be related to cystitis. Recommend clinical correlation. Electronically Signed   By: Charlett Nose M.D.   On: 07/20/2023 23:23   DG Chest 2 View Result Date: 07/20/2023 CLINICAL DATA:  Sepsis EXAM: CHEST - 2 VIEW COMPARISON:  03/03/2021 FINDINGS: The heart size and mediastinal contours are within normal limits. No focal airspace consolidation, pleural effusion, or pneumothorax. The visualized skeletal structures are unremarkable. IMPRESSION: No active cardiopulmonary disease. Electronically Signed   By: Duanne Guess D.O.   On: 07/20/2023 15:12       Kathlen Mody M.D. [facility] 07/22/2023, 12:57 PM  Available via Epic secure chat 7am-7pm After 7 pm, please refer to night coverage provider listed on amion.

## 2023-07-22 NOTE — Plan of Care (Signed)
  Problem: Clinical Measurements: Goal: Will remain free from infection Outcome: Progressing   Problem: Clinical Measurements: Goal: Ability to maintain clinical measurements within normal limits will improve Outcome: Progressing   Problem: Clinical Measurements: Goal: Respiratory complications will improve Outcome: Progressing   Problem: Clinical Measurements: Goal: Cardiovascular complication will be avoided Outcome: Progressing   Problem: Clinical Measurements: Goal: Diagnostic test results will improve Outcome: Progressing   Problem: Coping: Goal: Level of anxiety will decrease Outcome: Progressing   Problem: Nutrition: Goal: Adequate nutrition will be maintained Outcome: Progressing

## 2023-07-23 DIAGNOSIS — N3001 Acute cystitis with hematuria: Secondary | ICD-10-CM | POA: Diagnosis not present

## 2023-07-23 DIAGNOSIS — A419 Sepsis, unspecified organism: Secondary | ICD-10-CM | POA: Diagnosis not present

## 2023-07-23 DIAGNOSIS — N179 Acute kidney failure, unspecified: Secondary | ICD-10-CM | POA: Diagnosis not present

## 2023-07-23 DIAGNOSIS — D72819 Decreased white blood cell count, unspecified: Secondary | ICD-10-CM | POA: Diagnosis not present

## 2023-07-23 DIAGNOSIS — B20 Human immunodeficiency virus [HIV] disease: Secondary | ICD-10-CM | POA: Diagnosis not present

## 2023-07-23 LAB — COMPREHENSIVE METABOLIC PANEL
ALT: 14 U/L (ref 0–44)
AST: 28 U/L (ref 15–41)
Albumin: 2 g/dL — ABNORMAL LOW (ref 3.5–5.0)
Alkaline Phosphatase: 39 U/L (ref 38–126)
Anion gap: 7 (ref 5–15)
BUN: 13 mg/dL (ref 6–20)
CO2: 20 mmol/L — ABNORMAL LOW (ref 22–32)
Calcium: 8 mg/dL — ABNORMAL LOW (ref 8.9–10.3)
Chloride: 108 mmol/L (ref 98–111)
Creatinine, Ser: 1.37 mg/dL — ABNORMAL HIGH (ref 0.61–1.24)
GFR, Estimated: >60 mL/min (ref >60)
Glucose, Bld: 103 mg/dL — ABNORMAL HIGH (ref 70–99)
Potassium: 4 mmol/L (ref 3.5–5.1)
Sodium: 135 mmol/L (ref 135–145)
Total Bilirubin: 0.5 mg/dL (ref <1.2)
Total Protein: 7.9 g/dL (ref 6.5–8.1)

## 2023-07-23 LAB — TSH: TSH: 2.81 u[IU]/mL (ref 0.350–4.500)

## 2023-07-23 LAB — OSMOLALITY: Osmolality: 296 mosm/kg — ABNORMAL HIGH (ref 275–295)

## 2023-07-23 LAB — CORTISOL: Cortisol, Plasma: 8.5 ug/dL

## 2023-07-23 MED ORDER — ACETAMINOPHEN 325 MG PO TABS: 650.0000 mg | ORAL_TABLET | ORAL | Status: DC | PRN

## 2023-07-23 MED ORDER — SENNOSIDES-DOCUSATE SODIUM 8.6-50 MG PO TABS
2.0000 | ORAL_TABLET | Freq: Two times a day (BID) | ORAL | Status: DC
  Administered 2023-07-23 – 2023-08-02 (×19): 2 via ORAL
  Filled 2023-07-23 (×23): qty 2

## 2023-07-23 MED ORDER — DAPSONE 100 MG PO TABS
100.0000 mg | ORAL_TABLET | Freq: Every day | ORAL | Status: DC
  Administered 2023-07-23 – 2023-08-10 (×19): 100 mg via ORAL
  Filled 2023-07-23 (×19): qty 1

## 2023-07-23 MED ORDER — DARUN-COBIC-EMTRICIT-TENOFAF 800-150-200-10 MG PO TABS
1.0000 | ORAL_TABLET | Freq: Every day | ORAL | Status: DC
  Administered 2023-07-23 – 2023-08-10 (×19): 1 via ORAL
  Filled 2023-07-23 (×19): qty 1

## 2023-07-23 NOTE — Plan of Care (Signed)
°  Problem: Clinical Measurements: Goal: Ability to maintain clinical measurements within normal limits will improve Outcome: Progressing   Problem: Clinical Measurements: Goal: Will remain free from infection Outcome: Progressing   Problem: Nutrition: Goal: Adequate nutrition will be maintained Outcome: Progressing   Problem: Pain Management: Goal: General experience of comfort will improve Outcome: Progressing   Problem: Safety: Goal: Ability to remain free from injury will improve Outcome: Progressing   Problem: Skin Integrity: Goal: Risk for impaired skin integrity will decrease Outcome: Progressing

## 2023-07-23 NOTE — Plan of Care (Signed)
  Problem: Activity: Goal: Risk for activity intolerance will decrease Outcome: Progressing   Problem: Nutrition: Goal: Adequate nutrition will be maintained Outcome: Progressing   Problem: Coping: Goal: Level of anxiety will decrease Outcome: Progressing   Problem: Pain Management: Goal: General experience of comfort will improve Outcome: Progressing   Problem: Safety: Goal: Ability to remain free from injury will improve Outcome: Progressing

## 2023-07-23 NOTE — Progress Notes (Signed)
[facility]                                                                               Jacob Gutierrez, is a 37 y.o. male, DOB - 03-11-1986, WUJ:[id number] Admit date - 07/20/2023    Outpatient Primary MD for the patient is [name], [name], MD  LOS - 3  days    Brief summary    Jacob Gutierrez is a 37 y.o. male with medical history significant of HIV/AIDS, medication noncompliance, remote substance abuse, pulmonary coccidiomycosis, syphilis, gonorrhea, monkeypox, ESBL E. coli UTI and HTN who presents to the ED for evaluation of dysuria, fevers and chills. Patient reports he is followed by the [facility] here in [place] but he moved to [place] and has been out of his HIV medications since June/July. He has had a history of UTIs and prostatitis and was recently admitted for sepsis due to UTI last month in [place]. He came to [facility] for the holidays and has continued to have urinary symptoms including difficulty urinating, pain with urination and dark urine. Over the last 3-4 days, he has had intermittent fevers and sweats. Chest x-ray with no active cardiopulmonary disease Sepsis protocol was activated and patient was started on IV fluids and broad-spectrum antibiotics with IV linezolid, cefepime and Flagyl TRH was consulted for admission     Assessment & Plan    Assessment and Plan:   Sepsis secondary to UTI:  - in the setting of immunocompromised state due to Hiv/aids and non compliance to meds.  - continue with broad spectrum antibiotics. Currently on Ertapenem , Linezolid discontinued.  - urine cultures growing 1,00 000  ESBL. Sensitive to ertapenem.    Pancytopenia:  Suspect from HIV/AIDS Non compliant to meds.  ID on board.    H/o HIV/AIDS ID consulted  and recommendations given.  CD4  t CELL LESS THAN 35, Helper T cell around 2.  RPR titer is 1:16. Cryptococcal antigen is negative. Blastomyces antigen pending.  Respiratory panel is positive for  coronovirus.    AKI with metabolic acidosis Creatinine improved from 1.76 to 1.57  to 1.49  to 1.3 with IV fluids. Suspect he has some underlying CKD  US renal Mild bilateral hydronephrosis. Somewhat irregular but rather diffuse bladder wall thickening. Recommend outpatient follow up with Urology on discharge.  Adequate urine output .  Added fomax.   Hypotension:  Resolved, and BP parameters improved with midodrine 5 mg TID.   Anemia from iron deficiency and vit b12 deficiency Supplementation added.    Hyponatremia:  Initially thought to be secondary to dehydration and sepsis,. Resolved.  Serum osmo is 296, Tsh wnl, am cortisol is 8.5.    Respiratory panel positive for corona virus Pt currently denies any sob or cough.    H/o of STD's Pt with h/o treated gonorrhea and syphilis He does report on unprotective sexual intercourse with a male about a month ago. -Swab for GC/chlamydia pending.      Estimated body mass index is 24.29 kg/m as calculated from the following:   Height as of this encounter: 6\' 1"  (1.854 m).   Weight as of this encounter: 83.5 kg.  Code Status:  full code.  DVT Prophylaxis:  enoxaparin (LOVENOX) injection 40 mg Start: 07/20/23 2200   Level of Care: Level of care: Med-Surg Family Communication: none at bedside.   Disposition Plan:     Remains inpatient appropriate:  IV antibiotics  Procedures:  None.   Consultants:   Infectious disease.   Antimicrobials:   Anti-infectives (From admission, onward)    Start     Dose/Rate Route Frequency Ordered Stop   07/21/23 1200  ertapenem (INVANZ) 1 g in sodium chloride 0.9 % 100 mL IVPB        1 g 200 mL/hr over 30 Minutes Intravenous Every 24 hours 07/21/23 1101     07/21/23 1000  metroNIDAZOLE (FLAGYL) IVPB 500 mg  Status:  Discontinued        500 mg 100 mL/hr over 60 Minutes Intravenous Every 12 hours 07/20/23 2202 07/21/23 1101   07/21/23 0800  linezolid (ZYVOX) IVPB 600 mg        600  mg 300 mL/hr over 60 Minutes Intravenous Every 12 hours 07/20/23 2202     07/20/23 2300  ceFEPIme (MAXIPIME) 2 g in sodium chloride 0.9 % 100 mL IVPB  Status:  Discontinued        2 g 200 mL/hr over 30 Minutes Intravenous Every 8 hours 07/20/23 2205 07/21/23 1101   07/20/23 1445  linezolid (ZYVOX) IVPB 600 mg        600 mg 300 mL/hr over 60 Minutes Intravenous  Once 07/20/23 1437 07/20/23 1829   07/20/23 1430  aztreonam (AZACTAM) 2 g in sodium chloride 0.9 % 100 mL IVPB  Status:  Discontinued        2 g 200 mL/hr over 30 Minutes Intravenous  Once 07/20/23 1417 07/20/23 1419   07/20/23 1430  metroNIDAZOLE (FLAGYL) IVPB 500 mg        500 mg 100 mL/hr over 60 Minutes Intravenous  Once 07/20/23 1417 07/20/23 1727   07/20/23 1430  ceFEPIme (MAXIPIME) 2 g in sodium chloride 0.9 % 100 mL IVPB        2 g 200 mL/hr over 30 Minutes Intravenous  Once 07/20/23 1419 07/20/23 1557        Medications  Scheduled Meds:  cyanocobalamin  1,000 mcg Intramuscular Weekly   enoxaparin (LOVENOX) injection  40 mg Subcutaneous Q24H   ferrous sulfate  325 mg Oral BID WC   midodrine  5 mg Oral TID WC   pantoprazole  40 mg Oral Daily   sodium bicarbonate  1,300 mg Oral BID   tamsulosin  0.4 mg Oral QPC supper   Continuous Infusions:  ertapenem 1 g (07/23/23 1036)   linezolid (ZYVOX) IV 600 mg (07/23/23 1208)   PRN Meds:.ondansetron (ZOFRAN) IV    Subjective:   Jacob Gutierrez was seen and examined today.  No new complaints today. No diarrhea,or nausea, or vomiting.   Objective:   Vitals:   07/22/23 1421 07/22/23 1423 07/22/23 1604 07/23/23 0424  BP: 106/67 96/70 104/65 100/67  Pulse: 82 (!) 104 75 70  Resp:   20 16  Temp:   98.2 F (36.8 C) 98.1 F (36.7 C)  TempSrc:   Oral Oral  SpO2:   98% 98%  Weight:      Height:        Intake/Output Summary (Last 24 hours) at 07/23/2023 1212 Last data filed at 07/22/2023 1500 Gross per 24 hour  Intake 360 ml  Output --  Net 360 ml   Filed  Weights   07/21/23  0900 07/21/23 1528  Weight: 84.6 kg 83.5 kg     Exam General exam: Appears calm and comfortable  Respiratory system: Clear to auscultation. Respiratory effort normal. Cardiovascular system: S1 & S2 heard, RRR Gastrointestinal system: Abdomen is nondistended, soft and nontender. No organomegaly or masses felt. Central nervous system: Alert and oriented. Extremities: no pedal edema.  Skin: No rashes,  Psychiatry: Mood & affect appropriate.    Data Reviewed:  I have personally reviewed following labs and imaging studies   CBC Lab Results  Component Value Date   WBC 1.4 (LL) 07/22/2023   RBC 2.79 (L) 07/22/2023   HGB 8.5 (L) 07/22/2023   HCT 25.3 (L) 07/22/2023   MCV 90.7 07/22/2023   MCH 30.5 07/22/2023   PLT 118 (L) 07/22/2023   MCHC 33.6 07/22/2023   RDW 15.5 07/22/2023   LYMPHSABS 0.0 (L) 07/22/2023   MONOABS 0.1 07/22/2023   EOSABS 0.0 07/22/2023   BASOSABS 0.0 07/22/2023     Last metabolic panel Lab Results  Component Value Date   NA 135 07/23/2023   K 4.0 07/23/2023   CL 108 07/23/2023   CO2 20 (L) 07/23/2023   BUN 13 07/23/2023   CREATININE 1.37 (H) 07/23/2023   GLUCOSE 103 (H) 07/23/2023   GFRNONAA >60 07/23/2023   GFRAA 105 08/25/2020   CALCIUM 8.0 (L) 07/23/2023   PHOS 2.5 10/19/2020   PROT 7.9 07/23/2023   ALBUMIN 2.0 (L) 07/23/2023   LABGLOB 4.8 (H) 10/08/2020   AGRATIO 0.6 (L) 10/08/2020   BILITOT 0.5 07/23/2023   ALKPHOS 39 07/23/2023   AST 28 07/23/2023   ALT 14 07/23/2023   ANIONGAP 7 07/23/2023    CBG (last 3)  No results for input(s): "GLUCAP" in the last 72 hours.    Coagulation Profile: Recent Labs  Lab 07/20/23 1310  INR 1.2     Radiology Studies: No results found.      Kathlen Mody M.D. [facility] 07/23/2023, 12:12 PM  Available via Epic secure chat 7am-7pm After 7 pm, please refer to night coverage provider listed on amion.

## 2023-07-23 NOTE — Plan of Care (Signed)
°  Problem: Clinical Measurements: Goal: Diagnostic test results will improve Outcome: Progressing   Problem: Nutrition: Goal: Adequate nutrition will be maintained Outcome: Progressing   Problem: Pain Management: Goal: General experience of comfort will improve Outcome: Progressing   Problem: Safety: Goal: Ability to remain free from injury will improve Outcome: Progressing

## 2023-07-23 NOTE — Progress Notes (Signed)
[facility] Infectious Diseases Follow Up Note  Patient Identification: Patient Name: Jacob Gutierrez MRN: [id number] Admit Date: 07/20/2023 12:43 PM Age: 37 y.o.Today's Date: 07/23/2023  Reason for Visit: AIDS/UTI   Active Problems:   Urinary tract infection with hematuria   Pancytopenia (HCC)   AKI (acute kidney injury) (HCC)   Sepsis (HCC)   Neutropenia with fever (HCC)   Symptomatic HIV infection (HCC)   Febrile illness   Leukopenia  Antibiotics:  Linezolid 12/26- Cefepime 12/26, Ertapenem 12/27- Metronidazole 12/26     Lines/Hardware:   12/26 crypto ag negative   Interval Events: fevers resolved. Urine cx with ESBL GNR. Cr down to 1.37   Assessment # Fevers: resolved. UTI vs viral infection  - Long h/o difficulty with urination ( h/o TURP) with recent increased frequency/hematuria a/w fevers - RVP positive for corona virus OC43    - Fevers could be viral vs bacterial UTI. He has h/o frequent UTIs in the past with ESBL E coli. Given his AIDs, not on treatment, will also however need to concurrently consider other opportunistic infections, fungal, MAC   # HIV/AIDs - not on tx since June/July after moving out from [place] - He was supposed to be on Symtuza and bactrim when last seen in 09/2022 and was uncontrolled with CD4 less than 35 Lab Results  Component Value Date   HIV1RNAQUANT 1,180,000 07/20/2023   Lab Results  Component Value Date   CD4TABS <35 (L) 07/20/2023   CD4TABS <35 (L) 10/18/2022   CD4TABS 184 (L) 12/15/2021    # Syphilis  - 10/18/22 RPR 1: 24, s/p one dose of Benzathine Pen G on 10/26/22 - 12/26 RPR 1: 16, downtrending    # Loss of appetite/weight loss - 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 No feasible PO options  - Discussed about starting ART with prior regimen symtuza, he is agreeable and will start as well as PO dapsone for PJP ppx ( avoid bactrim with AKI). Check G6pd in am.  - I will also order CT CAP wo contrast given his ? H/o pulmonary cocci, significant appetite and weight loss as well as pancytopenia to r/o any lymphadenopathy - Fu pending labs sent Histoplasma ag, blastomyces, Urine GC, AFB blood cx and smear, sent out test for coccidiodomycosis  - He was not clear about moving back to [place] vs staying at [facility] area and will need to arrange for fu accordingly - He will also need OP Urology fu as he has long standing issues with urination - Monitor CBC and BMP  Dr Luciana Axe on starting tomorrow   Rest of the management as per the primary team. Thank you for the consult. Please page with pertinent questions or concerns.  ______________________________________________________________________ Subjective patient seen and examined at the bedside. Feeling better, burning with urination is better. Feels having more energy. Denies nausea, vomiting.  Vitals BP 108/70 (BP Location: Right Arm)   Pulse 79   Temp 97.6 F (36.4 C) (Oral)   Resp 17   Ht 6\' 1"  (1.854 m)   Wt 83.5 kg   SpO2 98%  BMI 24.29 kg/m    Physical Exam Constitutional:  adult cachectic male lying in the bed, appears comfortable     Comments: HEENT wnl   Cardiovascular:     Rate and Rhythm: Normal rate and regular rhythm.     Heart sounds:  Pulmonary:     Effort: Pulmonary effort is normal.     Comments:   Abdominal:     Palpations: Abdomen is soft.     Tenderness: non distended   Musculoskeletal:        General: No swelling or tenderness.   Skin:    Comments: no rashes   Neurological:     General: awake, alert  and oriented, non focal   Psychiatric:        Mood and Affect: Mood normal.   Pertinent Microbiology Results for orders placed or performed during the hospital encounter of 07/20/23  Culture, blood (Routine x 2)     Status: None (Preliminary result)   Collection Time: 07/20/23  1:10 PM   Specimen: BLOOD LEFT HAND  Result Value Ref Range Status   Specimen Description BLOOD LEFT HAND  Final   Special Requests   Final    BOTTLES DRAWN AEROBIC AND ANAEROBIC Blood Culture results may not be optimal due to an inadequate volume of blood received in culture bottles   Culture   Final    NO GROWTH 3 DAYS Performed at [facility], [address], [place], [place] [address]    Report Status PENDING  Incomplete  Urine Culture     Status: Abnormal   Collection Time: 07/20/23  3:13 PM   Specimen: Urine, Clean Catch  Result Value Ref Range Status   Specimen Description URINE, CLEAN CATCH  Final   Special Requests   Final    NONE Reflexed from [id number] Performed at [facility], [address], [place], [place] [address]    Culture (A)  Final    >=100,000 COLONIES/mL GRAM NEGATIVE RODS Confirmed Extended Spectrum Beta-Lactamase Producer (ESBL).  In bloodstream infections from ESBL organisms, carbapenems are preferred over piperacillin/tazobactam. They are shown to have a lower risk of mortality.    Report Status 07/23/2023 FINAL  Final   Organism ID, Bacteria GRAM NEGATIVE RODS (A)  Final      Susceptibility   Gram negative rods - MIC*    AMPICILLIN >=32 RESISTANT Resistant     CEFAZOLIN >=64 RESISTANT Resistant     CEFEPIME 16 RESISTANT Resistant     CEFTRIAXONE >=64 RESISTANT Resistant     CIPROFLOXACIN >=4 RESISTANT Resistant     GENTAMICIN <=1 SENSITIVE Sensitive     IMIPENEM <=0.25 SENSITIVE Sensitive     NITROFURANTOIN <=16 SENSITIVE Sensitive     TRIMETH/SULFA >=320 RESISTANT Resistant     AMPICILLIN/SULBACTAM 4 SENSITIVE Sensitive     PIP/TAZO <=4 SENSITIVE Sensitive  ug/mL    * >=100,000 COLONIES/mL GRAM NEGATIVE RODS  Respiratory (~20 pathogens) panel by PCR     Status: Abnormal   Collection Time: 07/20/23  6:06 PM   Specimen: Nasopharyngeal Swab; Respiratory  Result Value Ref Range Status   Adenovirus NOT DETECTED NOT DETECTED Final   Coronavirus 229E NOT DETECTED NOT DETECTED Final    Comment: (NOTE) The Coronavirus on the Respiratory Panel, DOES NOT test for the novel  Coronavirus (2019 nCoV)    Coronavirus HKU1 NOT DETECTED NOT DETECTED Final   Coronavirus NL63 NOT DETECTED NOT DETECTED Final   Coronavirus OC43 DETECTED (A) NOT DETECTED Final  Metapneumovirus NOT DETECTED NOT DETECTED Final   Rhinovirus / Enterovirus NOT DETECTED NOT DETECTED Final   Influenza A NOT DETECTED NOT DETECTED Final   Influenza B NOT DETECTED NOT DETECTED Final   Parainfluenza Virus 1 NOT DETECTED NOT DETECTED Final   Parainfluenza Virus 2 NOT DETECTED NOT DETECTED Final   Parainfluenza Virus 3 NOT DETECTED NOT DETECTED Final   Parainfluenza Virus 4 NOT DETECTED NOT DETECTED Final   Respiratory Syncytial Virus NOT DETECTED NOT DETECTED Final   Bordetella pertussis NOT DETECTED NOT DETECTED Final   Bordetella Parapertussis NOT DETECTED NOT DETECTED Final   Chlamydophila pneumoniae NOT DETECTED NOT DETECTED Final   Mycoplasma pneumoniae NOT DETECTED NOT DETECTED Final    Comment: Performed at [facility], [address] [place], [place] [address]  MRSA Next Gen by PCR, Nasal     Status: None   Collection Time: 07/21/23 11:47 AM   Specimen: Nasal Mucosa; Nasal Swab  Result Value Ref Range Status   MRSA by PCR Next Gen NOT DETECTED NOT DETECTED Final    Comment: (NOTE) The GeneXpert MRSA Assay (FDA approved for NASAL specimens only), is one component of a comprehensive MRSA colonization surveillance program. It is not intended to diagnose MRSA infection nor to guide or monitor treatment for MRSA infections. Test performance is not FDA approved in  patients less than 37 years old. Performed at [facility], [address], [place], [place] [address]    Pertinent Lab.    Latest Ref Rng & Units 07/22/2023    6:10 AM 07/21/2023    7:50 AM 07/20/2023    1:10 PM  CBC  WBC 4.0 - 10.5 K/uL 1.4  1.8  1.9   Hemoglobin 13.0 - 17.0 g/dL 8.5  9.7  62.9   Hematocrit 39.0 - 52.0 % 25.3  29.1  31.8   Platelets 150 - 400 K/uL 118  120  130       Latest Ref Rng & Units 07/23/2023    5:54 AM 07/22/2023    6:10 AM 07/21/2023   11:59 AM  CMP  Glucose 70 - 99 mg/dL 528  88  413   BUN 6 - 20 mg/dL 13  10  13    Creatinine 0.61 - 1.24 mg/dL 2.44  0.10  2.72   Sodium 135 - 145 mmol/L 135  131  130   Potassium 3.5 - 5.1 mmol/L 4.0  3.7  3.9   Chloride 98 - 111 mmol/L 108  104  103   CO2 22 - 32 mmol/L 20  22  19    Calcium 8.9 - 10.3 mg/dL 8.0  7.6  7.6   Total Protein 6.5 - 8.1 g/dL 7.9  7.2  7.7   Total Bilirubin <1.2 mg/dL 0.5  0.5  0.5   Alkaline Phos 38 - 126 U/L 39  39  40   AST 15 - 41 U/L 28  16  19    ALT 0 - 44 U/L 14  11  11       Pertinent Imaging today Plain films and CT images have been personally visualized and interpreted; radiology reports have been reviewed. Decision making incorporated into the Impression /   No results found.  I have personally spent 51  minutes involved in face-to-face and non-face-to-face activities for this patient on the day of the visit. Professional time spent includes the following activities: Preparing to see the patient (review of tests), Obtaining and/or reviewing separately obtained history (admission/discharge record), Performing  a medically appropriate examination and/or evaluation , Ordering medications/tests/procedures, referring and communicating with other health care professionals, Documenting clinical information in the EMR, Independently interpreting results (not separately reported), Communicating results to the patient/family/caregiver, Counseling and educating the  patient/family/caregiver and Care coordination (not separately reported).   Plan d/w requesting provider as well as ID pharm D  Of note, portions of this note may have been created with voice recognition software. While this note has been edited for accuracy, occasional wrong-word or sound-a-like substitutions may have occurred due to the inherent limitations of voice recognition software.   Electronically signed by:   Odette Fraction, MD Infectious Disease Physician [facility] Pager: [phone]

## 2023-07-24 ENCOUNTER — Telehealth (HOSPITAL_COMMUNITY): Payer: Self-pay | Admitting: Pharmacy Technician

## 2023-07-24 ENCOUNTER — Encounter (HOSPITAL_COMMUNITY): Payer: Self-pay | Admitting: Student

## 2023-07-24 ENCOUNTER — Other Ambulatory Visit (HOSPITAL_COMMUNITY): Payer: Self-pay

## 2023-07-24 ENCOUNTER — Inpatient Hospital Stay (HOSPITAL_COMMUNITY): Payer: 59

## 2023-07-24 ENCOUNTER — Other Ambulatory Visit: Payer: Self-pay

## 2023-07-24 DIAGNOSIS — B20 Human immunodeficiency virus [HIV] disease: Secondary | ICD-10-CM | POA: Diagnosis not present

## 2023-07-24 DIAGNOSIS — B9629 Other Escherichia coli [E. coli] as the cause of diseases classified elsewhere: Secondary | ICD-10-CM

## 2023-07-24 DIAGNOSIS — E440 Moderate protein-calorie malnutrition: Secondary | ICD-10-CM | POA: Insufficient documentation

## 2023-07-24 DIAGNOSIS — Z1612 Extended spectrum beta lactamase (ESBL) resistance: Secondary | ICD-10-CM

## 2023-07-24 DIAGNOSIS — B342 Coronavirus infection, unspecified: Secondary | ICD-10-CM

## 2023-07-24 DIAGNOSIS — Z5900 Homelessness unspecified: Secondary | ICD-10-CM

## 2023-07-24 DIAGNOSIS — N390 Urinary tract infection, site not specified: Secondary | ICD-10-CM | POA: Diagnosis not present

## 2023-07-24 DIAGNOSIS — A4151 Sepsis due to Escherichia coli [E. coli]: Principal | ICD-10-CM

## 2023-07-24 LAB — COMPREHENSIVE METABOLIC PANEL
ALT: 82 U/L — ABNORMAL HIGH (ref 0–44)
AST: 182 U/L — ABNORMAL HIGH (ref 15–41)
Albumin: 2.1 g/dL — ABNORMAL LOW (ref 3.5–5.0)
Alkaline Phosphatase: 50 U/L (ref 38–126)
Anion gap: 7 (ref 5–15)
BUN: 11 mg/dL (ref 6–20)
CO2: 22 mmol/L (ref 22–32)
Calcium: 8 mg/dL — ABNORMAL LOW (ref 8.9–10.3)
Chloride: 108 mmol/L (ref 98–111)
Creatinine, Ser: 1.29 mg/dL — ABNORMAL HIGH (ref 0.61–1.24)
GFR, Estimated: >60 mL/min (ref >60)
Glucose, Bld: 99 mg/dL (ref 70–99)
Potassium: 3.7 mmol/L (ref 3.5–5.1)
Sodium: 137 mmol/L (ref 135–145)
Total Bilirubin: 0.3 mg/dL (ref <1.2)
Total Protein: 7.7 g/dL (ref 6.5–8.1)

## 2023-07-24 LAB — GC/CHLAMYDIA PROBE AMP (~~LOC~~) NOT AT ARMC
Chlamydia: NEGATIVE
Comment: NEGATIVE
Comment: NORMAL
Neisseria Gonorrhea: NEGATIVE

## 2023-07-24 LAB — T.PALLIDUM AB, TOTAL: T Pallidum Abs: REACTIVE — AB

## 2023-07-24 MED ORDER — THIAMINE MONONITRATE 100 MG PO TABS
100.0000 mg | ORAL_TABLET | Freq: Every day | ORAL | Status: AC
  Administered 2023-07-24 – 2023-07-28 (×5): 100 mg via ORAL
  Filled 2023-07-24 (×5): qty 1

## 2023-07-24 MED ORDER — GADOBUTROL 1 MMOL/ML IV SOLN
8.0000 mL | Freq: Once | INTRAVENOUS | Status: AC | PRN
  Administered 2023-07-24: 8 mL via INTRAVENOUS

## 2023-07-24 MED ORDER — ADULT MULTIVITAMIN W/MINERALS CH
1.0000 | ORAL_TABLET | Freq: Every day | ORAL | Status: DC
  Administered 2023-07-24 – 2023-08-10 (×18): 1 via ORAL
  Filled 2023-07-24 (×18): qty 1

## 2023-07-24 MED ORDER — ENSURE ENLIVE PO LIQD
237.0000 mL | Freq: Two times a day (BID) | ORAL | Status: DC
  Administered 2023-07-24 – 2023-07-31 (×13): 237 mL via ORAL

## 2023-07-24 NOTE — Assessment & Plan Note (Addendum)
07-24-2023 pt states his mother lives in [place]. TOC consulted for assistance in housing. Pt just returned from [place] where he was staying with family friend.  07-25-2023 TOC is engaged and pt may ultimately need to go to homeless shelter at discharge.

## 2023-07-24 NOTE — Assessment & Plan Note (Addendum)
07-24-2023 seen by ID. Started on Sumtuza, dapsone for PJP prophylaxis. 07-25-2023 continue with HIV meds and dapsone for PJP prophylaxis.

## 2023-07-24 NOTE — Subjective & Objective (Addendum)
Pt seen and examined. Have consulted with urology to evaluate pt for his hydroureter, bladder and prostate abnormality. Pt underwent prostate MRI yesterday.

## 2023-07-24 NOTE — Assessment & Plan Note (Addendum)
07-25-2023 Not covid-19.  Continue with supportive care. On RA.

## 2023-07-24 NOTE — Progress Notes (Signed)
   07/24/23 1509  SDOH Interventions  Housing Interventions Inpatient TOC   Resources provided

## 2023-07-24 NOTE — TOC Initial Note (Signed)
Transition of Care (TOC) - Initial/Assessment Note   Spoke to patient at bedside.   Per ID note : Will need to stay in house on IV ertapenem through 1/9   Patient states he has no friends to stay with at discharge. He cannot stay with mother or aunt at discharge. He does plan to stay in [place] at discharge. Address on face sheet is his mother's. Phone number on face sheet is his working cell.   NCM provided patient  hardcopy of resources on [employer] to [employer] in [place], shelters list , transportation,also placed resources on AVS.   Patient voiced understanding that  he has to call shelters , [facility] does not arrange shelters.   Patient does not have a PCP. He is agreeable to establishing care at a [facility] clinic. Appointment scheduled and placed on AVS  Patient Details  Name: Jacob Gutierrez MRN: [id number] Date of Birth: [birth year]  Transition of Care [facility]) CM/SW Contact:    Kingsley Plan, RN Phone Number: 07/24/2023, 3:03 PM  Clinical Narrative:                   Expected Discharge Plan: Homeless Shelter Barriers to Discharge: Continued Medical Work up (Will need to stay in house on IV ertapenem through 1/9)   Patient Goals and CMS Choice Patient states their goals for this hospitalization and ongoing recovery are:: to get better          Expected Discharge Plan and Services   Discharge Planning Services: CM Consult Post Acute Care Choice: NA                   DME Arranged: N/A DME Agency: NA       HH Arranged: NA HH Agency: NA        Prior Living Arrangements/Services     Patient language and need for interpreter reviewed:: Yes Do you feel safe going back to the place where you live?:  (see note)      Need for Family Participation in Patient Care: No (Comment)     Criminal Activity/Legal Involvement Pertinent to Current Situation/Hospitalization: No - Comment as  needed  Activities of Daily Living   ADL Screening (condition at time of admission) Independently performs ADLs?: Yes (appropriate for developmental age) Is the patient deaf or have difficulty hearing?: No Does the patient have difficulty seeing, even when wearing glasses/contacts?: No Does the patient have difficulty concentrating, remembering, or making decisions?: No  Permission Sought/Granted   Permission granted to share information with : No              Emotional Assessment Appearance:: Appears stated age Attitude/Demeanor/Rapport: Engaged Affect (typically observed): Accepting Orientation: : Oriented to Self, Oriented to Place, Oriented to  Time, Oriented to Situation Alcohol / Substance Use: Not Applicable, Alcohol Use    Admission diagnosis:  Febrile illness [R50.9] Symptomatic HIV infection (HCC) [B20] Medically noncompliant [Z91.199] Sepsis (HCC) [A41.9] Urinary tract infection with hematuria, site unspecified [N39.0, R31.9] Leukopenia, unspecified type [D72.819] Patient Active Problem List   Diagnosis Date Noted   Neutropenia with fever (HCC) 07/20/2023   Symptomatic HIV infection (HCC) 07/20/2023   Febrile illness 07/20/2023   Leukopenia 07/20/2023   Lower abdominal pain 11/15/2021   Oral lesion    HTN (hypertension) 05/20/2021   Pressure injury of skin 03/05/2021   Group A streptococcal infection    Sepsis due to Escherichia coli (E. coli) (HCC) 03/04/2021  Lactic acidosis 03/04/2021   GERD (gastroesophageal reflux disease) 03/04/2021   Nonadherence to medical treatment    AKI (acute kidney injury) (HCC) 02/10/2021   Loose stools    Coronavirus infection 08/25/2020   Pancytopenia (HCC) 09/20/2018   Avoidance coping 09/07/2018   UTI due to extended-spectrum beta lactamase (ESBL) producing Escherichia coli 07/19/2018   Headache 04/27/2018   Condyloma 04/24/2018   Protein-calorie malnutrition, severe 12/12/2017   Personal history of MRSA  (methicillin resistant Staphylococcus aureus) 12/09/2017   History of ESBL E. 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 [place] [address] Phone: [phone] Fax: [phone]  [id number] - [place], [place] - [address] AT [facility] [address] [place] [address] Phone: [phone] Fax: [phone]  [facility] [address], [place] - [address] AT [address] [place] [address] Phone: [phone] Fax: [phone]     Social Drivers of Health (SDOH) Social History: SDOH Screenings   Food Insecurity: No Food Insecurity (07/21/2023)  Housing: High Risk (07/21/2023)  Transportation Needs: No Transportation Needs (07/22/2023)  Utilities: Patient Declined (07/22/2023)  Depression (PHQ2-9): Low Risk  (10/18/2022)  Financial Resource Strain: Low Risk  (06/09/2018)  Physical Activity: Unknown (06/09/2018)  Social Connections: Unknown (06/09/2018)  Tobacco Use: High Risk (06/18/2023)   [facility] from [facility]   SDOH Interventions: Food Insecurity Interventions: Intervention Not Indicated   Readmission Risk Interventions    03/10/2021    2:47 PM  Readmission Risk Prevention Plan  Transportation Screening Complete  PCP or Specialist Appt within 3-5 Days Complete  HRI or Home Care Consult Complete  Social Work Consult for Recovery Care Planning/Counseling Complete  Palliative Care Screening Complete  Medication Review [employer]) Complete

## 2023-07-24 NOTE — Plan of Care (Signed)
  Problem: Pain Management: Goal: General experience of comfort will improve Outcome: Progressing   Problem: Safety: Goal: Ability to remain free from injury will improve Outcome: Progressing   Problem: Skin Integrity: Goal: Risk for impaired skin integrity will decrease Outcome: Progressing

## 2023-07-24 NOTE — Assessment & Plan Note (Addendum)
Since admission, in the setting of immunocompromised state due to Hiv/aids and non compliance to meds.  - continue with broad spectrum antibiotics. Currently on Ertapenem , Linezolid discontinued.  - urine cultures growing  >100K  ESBL. Sensitive to ertapenem.   07-24-2023 remains on IV Invanz. Day #4. Awaiting ID f/u note.  07-25-2023 ID wants to treat with 14 days of IV Invanz. Today is Day #5

## 2023-07-24 NOTE — Consult Note (Signed)
Subjective:    Consult requested by Dr. Greg Cutter is a 37 yo male with HIV/AID with medical non-compliance who has been off of therapy for 5-6 months.  He has a history of ESBL e. Coli UTI's and had a prostatic abscess unroofed by Dr. Marlou Porch in 2019.  He had a perineal abscess drained by Dr. Vernie Ammons in 11/19.   He was admit for UTI with sepsis in [place] a month ago and was admitted here with a 3-4 day history of sweats and fevers on 07/20/23.  He has had ongoing voiding symptoms with hesitancy, dysuria and dark urine.  His urine culture from 07/20/23 grew ESBL e. Coli that is being treated with ertapenem.   He had a CT today that shows bilateral hydronephrosis with some bladder wall thickening and a fluid collection in the prostate extending posteriorly to the apex.  This fluid collection was actually present on a prior CT from 5/23 but was not present in 10/22.   His Cr. Is 1.29 which is down from 1.76 on admission but up from baseline of 0.94 in 9/24.  He was last seen by [place] in 7/22 and had pyelonephritis with mild right hydroureteronephrosis to the bladder.  He had a foley then but no stent.   He tested positive for COVID on his respiratory panel.  He is feeling much better on the antibiotics with decrease pain and voiding symptoms.  PVR today is 8ml.   ROS:  Review of Systems  Constitutional:  Negative for chills and fever.  Respiratory:  Negative for shortness of breath.   Cardiovascular:  Negative for chest pain.  Gastrointestinal:  Negative for nausea and vomiting.  Genitourinary:  Negative for dysuria and flank pain.  All other systems reviewed and are negative.   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 [facility])    H/O coccidioidomycosis    pulmonary    HIV disease (HCC)    Human monkeypox    Past history of allergy to penicillin-type antibiotic 07/03/2012   desensitization   PNA (pneumonia)    Shigella gastroenteritis    Syphilis    history /treated     Past Surgical History:  Procedure Laterality Date   BIOPSY  10/16/2020   Procedure: BIOPSY;  Surgeon: Lemar Lofty., MD;  Location: [facility] ENDOSCOPY;  Service: Gastroenterology;;   BIOPSY  10/23/2020   Procedure: BIOPSY;  Surgeon: Lynann Bologna, MD;  Location: [facility] ENDOSCOPY;  Service: Endoscopy;;   COLONOSCOPY WITH PROPOFOL N/A 10/23/2020   Procedure: COLONOSCOPY WITH PROPOFOL;  Surgeon: Lynann Bologna, MD;  Location: [facility] ENDOSCOPY;  Service: Endoscopy;  Laterality: N/A;   ESOPHAGOGASTRODUODENOSCOPY (EGD) WITH PROPOFOL N/A 10/16/2020   Procedure: ESOPHAGOGASTRODUODENOSCOPY (EGD) WITH PROPOFOL;  Surgeon: Meridee Score Netty Starring., MD;  Location: [facility] ENDOSCOPY;  Service: Gastroenterology;  Laterality: N/A;   TRANSURETHRAL RESECTION OF PROSTATE N/A 12/09/2017   Procedure: TRANSURETHRAL RESECTION OF THE PROSTATE (TURP);  Surgeon: Crist Fat, MD;  Location: [facility] ORS;  Service: Urology;  Laterality: N/A;    Social History   Socioeconomic History   Marital status: Single    Spouse name: Not on file   Number of children: Not on file   Years of education: Not on file   Highest education level: Not on file  Occupational History   Not on file  Tobacco Use   Smoking status: Every Day    Current packs/day: 0.30    Types: Cigarettes, E-cigarettes   Smokeless tobacco: Never   Tobacco comments:    States he is trying to cut back, goes through a pack of cigarettes in about a week   Vaping Use   Vaping status: Never Used  Substance and Sexual Activity   Alcohol use: Yes    Alcohol/week: 1.0 standard drink of alcohol    Types: 1 Standard drinks or equivalent per week    Comment: whiskey occasionally    Drug use: Yes    Frequency: 1.0 times per week    Types: Marijuana    Comment: socially   Sexual activity: Yes    Partners: Female, Male    Birth control/protection: Condom    Comment: condoms given  Other Topics Concern   Not on file  Social History Narrative   Not on file   Social Drivers of Health   Financial Resource Strain: Low Risk  (06/09/2018)   Overall Financial Resource Strain (CARDIA)    Difficulty of Paying Living Expenses: Not very hard  Food Insecurity: No Food Insecurity (07/21/2023)   Hunger Vital Sign    Worried About Running Out of Food in the Last Year: Never true    Ran Out of Food in the Last Year: Never true  Transportation Needs: No Transportation Needs (07/22/2023)   PRAPARE - Administrator, Civil Service (Medical): No    Lack of Transportation (Non-Medical): No  Physical Activity: Unknown (06/09/2018)   Exercise Vital Sign    Days of Exercise per Week: Patient declined    Minutes of Exercise per Session: Patient declined  Stress: Not on file  Social Connections: Unknown (06/09/2018)   Social Connection and Isolation Panel [NHANES]    Frequency of Communication with Friends and Family: Patient declined    Frequency of Social Gatherings with Friends and Family: Patient declined    Attends Religious Services: Patient declined    Database administrator or Organizations: Patient declined    Attends Banker Meetings: Patient declined    Marital Status: Patient declined  Intimate Partner Violence: Patient Declined (07/22/2023)   Humiliation, Afraid, Rape, and Kick questionnaire    Fear of Current or Ex-Partner: Patient declined    Emotionally Abused: Patient declined    Physically Abused: Patient  declined    Sexually Abused: Patient declined    Family History  Problem Relation Age of Onset   Hypertension Mother    Lupus Maternal Grandmother    Diabetes Maternal Grandmother    Cancer Paternal Grandfather        unknown    Prostate cancer Father    Diabetes Father    Diabetes Maternal Grandfather    Colon cancer Neg Hx    Stomach cancer Neg Hx    Esophageal cancer Neg Hx    Pancreatic cancer Neg Hx     Anti-infectives: Anti-infectives (From admission, onward)    Start     Dose/Rate Route Frequency Ordered Stop   07/23/23 1700  Darunavir-Cobicistat-Emtricitabine-Tenofovir Alafenamide (SYMTUZA) 800-150-200-10 MG TABS 1 tablet  1 tablet Oral Daily with breakfast 07/23/23 1538     07/23/23 1630  dapsone tablet 100 mg        100 mg Oral Daily 07/23/23 1538     07/21/23 1200  ertapenem (INVANZ) 1 g in sodium chloride 0.9 % 100 mL IVPB        1 g 200 mL/hr over 30 Minutes Intravenous Every 24 hours 07/21/23 1101     07/21/23 1000  metroNIDAZOLE (FLAGYL) IVPB 500 mg  Status:  Discontinued        500 mg 100 mL/hr over 60 Minutes Intravenous Every 12 hours 07/20/23 2202 07/21/23 1101   07/21/23 0800  linezolid (ZYVOX) IVPB 600 mg  Status:  Discontinued        600 mg 300 mL/hr over 60 Minutes Intravenous Every 12 hours 07/20/23 2202 07/23/23 1415   07/20/23 2300  ceFEPIme (MAXIPIME) 2 g in sodium chloride 0.9 % 100 mL IVPB  Status:  Discontinued        2 g 200 mL/hr over 30 Minutes Intravenous Every 8 hours 07/20/23 2205 07/21/23 1101   07/20/23 1445  linezolid (ZYVOX) IVPB 600 mg        600 mg 300 mL/hr over 60 Minutes Intravenous  Once 07/20/23 1437 07/20/23 1829   07/20/23 1430  aztreonam (AZACTAM) 2 g in sodium chloride 0.9 % 100 mL IVPB  Status:  Discontinued        2 g 200 mL/hr over 30 Minutes Intravenous  Once 07/20/23 1417 07/20/23 1419   07/20/23 1430  metroNIDAZOLE (FLAGYL) IVPB 500 mg        500 mg 100 mL/hr over 60 Minutes Intravenous  Once 07/20/23 1417  07/20/23 1727   07/20/23 1430  ceFEPIme (MAXIPIME) 2 g in sodium chloride 0.9 % 100 mL IVPB        2 g 200 mL/hr over 30 Minutes Intravenous  Once 07/20/23 1419 07/20/23 1557       Current Facility-Administered Medications  Medication Dose Route Frequency Provider Last Rate Last Admin   acetaminophen (TYLENOL) tablet 650 mg  650 mg Oral Q4H PRN Kathlen Mody, MD       cyanocobalamin (VITAMIN B12) injection 1,000 mcg  1,000 mcg Intramuscular Weekly Kathlen Mody, MD   1,000 mcg at 07/21/23 1210   dapsone tablet 100 mg  100 mg Oral Daily Odette Fraction, MD   100 mg at 07/24/23 0944   Darunavir-Cobicistat-Emtricitabine-Tenofovir Alafenamide (SYMTUZA) 800-150-200-10 MG TABS 1 tablet  1 tablet Oral Q breakfast Odette Fraction, MD   1 tablet at 07/24/23 0944   enoxaparin (LOVENOX) injection 40 mg  40 mg Subcutaneous Q24H Steffanie Rainwater, MD   40 mg at 07/23/23 2126   ertapenem [facility]) 1 g in sodium chloride 0.9 % 100 mL IVPB  1 g Intravenous Q24H Odette Fraction, MD 200 mL/hr at 07/24/23 0948 1 g at 07/24/23 0948   feeding supplement (ENSURE ENLIVE / ENSURE PLUS) liquid 237 mL  237 mL Oral BID BM Carollee Herter, DO   237 mL at 07/24/23 1633   ferrous sulfate tablet 325 mg  325 mg Oral BID WC Kathlen Mody, MD   325 mg at 07/24/23 1713   midodrine (PROAMATINE) tablet 5 mg  5 mg Oral TID WC Kathlen Mody, MD   5 mg at 07/24/23 1713   multivitamin with minerals tablet 1 tablet  1 tablet Oral Daily Carollee Herter, DO   1 tablet at 07/24/23 1634   ondansetron (ZOFRAN) injection 4 mg  4 mg Intravenous Q6H PRN Anthoney Harada, NP   4 mg at 07/21/23 0622   pantoprazole (PROTONIX) EC tablet 40 mg  40 mg Oral Daily Steffanie Rainwater, MD   40 mg at 07/24/23 0944   senna-docusate (Senokot-S) tablet 2 tablet  2 tablet Oral BID Kathlen Mody, MD   2 tablet at 07/24/23 0944   sodium bicarbonate tablet 1,300 mg  1,300 mg Oral BID Steffanie Rainwater, MD   1,300 mg at 07/24/23 0944   tamsulosin (FLOMAX)  capsule 0.4 mg  0.4 mg Oral QPC supper Kathlen Mody, MD   0.4 mg at 07/24/23 1713   thiamine (VITAMIN B1) tablet 100 mg  100 mg Oral Daily Carollee Herter, DO   100 mg at 07/24/23 1633     Objective: Vital signs in last 24 hours: BP (!) 102/58 (BP Location: Right Arm)   Pulse 94   Temp 98.3 F (36.8 C) (Oral)   Resp 16   Ht 6\' 1"  (1.854 m)   Wt 83.5 kg   SpO2 100%   BMI 24.29 kg/m   Intake/Output from previous day: 12/29 0701 - 12/30 0700 In: -  Out: 150 [Urine:150] Intake/Output this shift: Total I/O In: 720 [P.O.:720] Out: 225 [Urine:225]   Physical Exam Vitals reviewed.  Constitutional:      Appearance: Normal appearance.     Comments: thin  Cardiovascular:     Rate and Rhythm: Normal rate and regular rhythm.  Pulmonary:     Effort: Pulmonary effort is normal. No respiratory distress.  Abdominal:     General: Abdomen is flat.     Palpations: Abdomen is soft.     Tenderness: There is abdominal tenderness (mild tenderness of lower abdomen.). There is no right CVA tenderness or left CVA tenderness.  Skin:    General: Skin is warm and dry.  Neurological:     General: No focal deficit present.     Mental Status: He is alert and oriented to person, place, and time.  Psychiatric:        Mood and Affect: Mood normal.        Behavior: Behavior normal.     Lab Results:  Results for orders placed or performed during the hospital encounter of 07/20/23 (from the past 24 hours)  Comprehensive metabolic panel     Status: Abnormal   Collection Time: 07/24/23  6:14 AM  Result Value Ref Range   Sodium 137 135 - 145 mmol/L   Potassium 3.7 3.5 - 5.1 mmol/L   Chloride 108 98 - 111 mmol/L   CO2 22 22 - 32 mmol/L   Glucose, Bld 99 70 - 99 mg/dL   BUN 11 6 - 20 mg/dL   Creatinine, Ser 8.65 (H) 0.61 - 1.24 mg/dL   Calcium 8.0 (L) 8.9 - 10.3 mg/dL   Total Protein 7.7 6.5 - 8.1 g/dL   Albumin 2.1 (L) 3.5 - 5.0 g/dL   AST 784 (H) 15 - 41 U/L   ALT 82 (H) 0 - 44 U/L   Alkaline  Phosphatase 50 38 - 126 U/L   Total Bilirubin 0.3 <1.2 mg/dL   GFR, Estimated >69 >62 mL/min   Anion gap 7 5 - 15    BMET Recent Labs    07/23/23 0554 07/24/23 0614  NA 135 137  K 4.0 3.7  CL 108 108  CO2 20* 22  GLUCOSE 103* 99  BUN 13 11  CREATININE 1.37* 1.29*  CALCIUM 8.0* 8.0*   PT/INR No  results for input(s): "LABPROT", "INR" in the last 72 hours. ABG No results for input(s): "PHART", "HCO3" in the last 72 hours.  Invalid input(s): "PCO2", "PO2"  Studies/Results: CT CHEST ABDOMEN PELVIS WO CONTRAST Result Date: 07/24/2023 CLINICAL DATA:  History of AIDS. Pancytopenia. Significant weight loss. History of pulmonary coccidiomycosis. EXAM: CT CHEST, ABDOMEN AND PELVIS WITHOUT CONTRAST TECHNIQUE: Multidetector CT imaging of the chest, abdomen and pelvis was performed following the standard protocol without IV contrast. RADIATION DOSE REDUCTION: This exam was performed according to the departmental dose-optimization program which includes automated exposure control, adjustment of the mA and/or kV according to patient size and/or use of iterative reconstruction technique. COMPARISON:  Abdomen pelvis CT 12/08/2021 FINDINGS: CT CHEST FINDINGS Cardiovascular: The heart size is normal. No substantial pericardial effusion. No thoracic aortic aneurysm. No substantial atherosclerosis of the thoracic aorta. Mediastinum/Nodes: No mediastinal lymphadenopathy. No evidence for gross hilar lymphadenopathy although assessment is limited by the lack of intravenous contrast on the current study. The esophagus has normal imaging features. There is no axillary lymphadenopathy. Lungs/Pleura: Paraseptal emphysema noted in the upper lungs bilaterally dependent subsegmental atelectasis noted right lower lobe. No suspicious pulmonary nodule or mass. Subsegmental atelectasis noted left lung base. No pleural effusion. Musculoskeletal: No worrisome lytic or sclerotic osseous abnormality. CT ABDOMEN PELVIS FINDINGS  Hepatobiliary: No suspicious focal abnormality in the liver on this study without intravenous contrast. There is no evidence for gallstones, gallbladder wall thickening, or pericholecystic fluid. No intrahepatic or extrahepatic biliary dilation. Pancreas: No focal mass lesion. No dilatation of the main duct. No intraparenchymal cyst. No peripancreatic edema. Spleen: No splenomegaly. No suspicious focal mass lesion. Adrenals/Urinary Tract: No adrenal nodule or mass. Moderate to severe right hydroureteronephrosis is associated with moderate left hydroureteronephrosis. Both ureters are dilated down to the level of the UVJ. Bladder is nondistended with mild irregular circumferential bladder wall thickening. Stomach/Bowel: Stomach is unremarkable. No gastric wall thickening. No evidence of outlet obstruction. Duodenum is normally positioned as is the ligament of Treitz. Small bowel and colon not well evaluated due to breathing motion during image acquisition. No small bowel wall thickening. No small bowel dilatation. The appendix is not well visualized, but there is no edema or inflammation in the region of the cecal tip to suggest appendicitis. Moderate stool volume. Vascular/Lymphatic: No abdominal aortic aneurysm. No abdominal aortic atherosclerotic calcification. Upper normal lymph nodes are seen in the hepatoduodenal ligament. Mild para-aortic lymphadenopathy noted with 12 mm left para-aortic index node on 64/3. Another 12 mm short axis left para-aortic node evident on 73/3. 12 mm low left para-aortic node visible on 85/3. Borderline lymphadenopathy seen in the pelvic sidewall bilaterally. Reproductive: Areas of fluid density are again noted in the prostate gland. Inferior prostate demonstrates 3.6 x 1.5 cm fluid density structure, increased from 2.9 x 1.2 cm on the previous study and potentially involving the urethra. Other: No intraperitoneal free fluid. Musculoskeletal: No worrisome lytic or sclerotic osseous  abnormality. IMPRESSION: 1. Moderate to severe right hydroureteronephrosis with moderate left hydroureteronephrosis. Both ureters are dilated down to the level of the UVJ. No obstructing calculus evident. 2. Mild irregular circumferential bladder wall thickening. 3. Areas of fluid density in the prostate gland are again noted. Inferior prostate demonstrates 3.6 x 1.5 cm fluid density structure, increased from 2.9 x 1.2 cm on the previous study and potentially involving the urethra. As noted on the prior study, prostatic abscess would be a consideration. 4. Mild para-aortic lymphadenopathy with borderline lymphadenopathy in the pelvic sidewall bilaterally. Retroperitoneal lymphadenopathy is  mildly progressive since the prior study. 5. No evidence for lymphadenopathy in the chest. 6. 6 mm pulmonary nodule identified at the right lung base previously has resolved. 7.  Emphysema (ICD10-J43.9). As noted previously, Electronically Signed   By: Kennith Center M.D.   On: 07/24/2023 07:05   I have reviewed his labs, imaging and notes as well as his prior urologic encounters.   Assessment/Plan: ESBL e. Coli UTI with sepsis and possible prostatic abscess.  Fluid collection in the prostate has been present since 5/23.   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  It would be helpful to have a prostate MRI to better direct therapy.    2.  Bilateral hydronephrosis.  His PVR was only 8ml.   His Cr is declining.   He would need cystoscopy with retrogrades and possible stents at somepoint when recovered from sepsis. .  The alternative would be to place a foley and rescan in a few days.           No follow-ups on file.    CC: DR. Feliberto Gottron 07/24/2023

## 2023-07-24 NOTE — Discharge Instructions (Addendum)
[facility] assistance programs. If you are behind on your bills and expenses, and need some help to make it through a short term hardship or financial emergency, there are several organizations and charities in the [place] area that may be able to help. They range from the [employer], Liberty Global, Landscape architect of [employer] Company and the local community action agency, the Intel, [employer]. These groups may be able to provide you resources to help pay your utility bills, rent, and they even offer housing assistance.  Crisis assistance program Find help for paying your rent, electric bills, free food, and even funds to pay your mortgage. The [employer] Global [phone]) offers several services to local families, as funding allows. The Emergency Assistance Program (EAP), which they administer, provides household goods, free food, clothing, and financial aid to people in need in the [facility] area. The EAP program does have some qualification, and counselors will interview clients for financial assistance by written referral only. Referrals need to be made by the Department of Social Services or by other EAP approved human services agencies or charities in the area.  Money for resources for emergency assistance are available for security deposits for rent, water, electric, and gas, past due rent, utility bills, past due mortgage payments, food, and clothing. The Liberty Global also operates a Programme researcher, broadcasting/film/video on the site. More Liberty Global.  Open Door Ministries of [employer], which can be reached at [phone], offers emergency assistance programs for those in need of help, such as food, rent assistance, a soup kitchen, shelter, and clothing. They are based in [facility] but provide a number of services to those that qualify for assistance. Continue with Open Door Ministries  programs.  [facility] Department of Social Services may be able to offer temporary financial assistance and cash grants for paying rent and utilities. Help may be provided for local county residents who may be experiencing personal crisis when other resources, including government programs, are not available. Call [phone]  [place] Society, which is based in [place], provides financial assistance of up to $50.00 to help pay for rent, utilities, cooling bills, rent, and prescription medications. The program also provides secondhand furniture to those in need. [phone]  Mattel is a Geneticist, molecular. The organization can offer emergency assistance for paying rent, electric bills, utilities, food, household products and furniture. They offer extensive emergency and transitional housing for families, children and single women, and also run a Boy's and Dole Food. 301 Thrift Shops, [employer], and other aid offered too. [address], [place], [place] [address], [phone]  Additional locations of the [employer] are in [place] and other nearby communities. When you have an emergency, need free food, money for basic needs, or just need assistance around Christmas, then the [employer] Stores may have the resources you need. Or they can refer you to nearby agencies. Learn more.  [place] Low Income Risk manager - This is offered for [facility] families. The federal government created CIT Group Program provides a one-time cash grant payment to help eligible low-income families pay their electric and heating bills. [address], [place], [place] [address], [phone]  Government and [employer] - The county administers several emergency and self-sufficiency programs. Residents of [place] can get help with energy bills and food, rent, and  other expenses. In addition,  work with a Sports coach who may be able to help you find a job or improve your employment skills. More [place] public assistance.  [place] Emergency Assistance - A program offers emergency utility and rent funds for greater [employer] area residents. The program can also provide counseling and referrals to charities and government programs. Also provides food and a free meal program that serves lunch Mondays - Saturdays and dinner seven days per week to individuals in the community. [address], [place], [place] [address], [phone]  [employer] - Offers affordable apartment and housing communities across [place] and [place]. The low income and seniors can access public housing, rental assistance to qualified applicants, and apply for the section 8 rent subsidy program. Other programs include Chiropractor and Engineer, maintenance. [address], [place], [place] [address], dial [phone].  Basic needs such as clothing - Low income families can receive free items (school supplies, clothes, holiday assistance, etc.) from clothing closets while more moderate income [address] families can shop at [employer]. Locations across the area help the needy. Get information on [place] free clothing centers.  The [facility] provides transitional housing to veterans and the disabled. Clients will also access other services too, including life skills classes, case management, and assistance in finding permanent housing. [address] [place], [place] [address], call [phone]  [facility] Transitional Housing in [place] is for people who were just evicted or that are formerly homeless. The non-profit will also help then gain self-sufficiency, find a home or apartment to live in, and also provides information on rent assistance when needed. Dial ([phone]  [employer] Partnership to End Homelessness is available in [place]. Families that were evicted or that are homeless can gain shelter, food, clothing, furniture, and also emergency financial assistance. Other services include financial skills and life skills coaching, job training, and case management. [address], [place], [place] [address]. Telephone [phone].  The [employer]ne[employer], [employer]. runs the [employer]. This can help people save money on their heating and summer cooling bills, and is free to low income families. Free upgrades can be made to your home. Phone [phone]  Many of the non-profits and programs mentioned above are all inclusive, meaning they can meet many needs of the low income, such as energy bills, food, rent, and more. However there are several organizations that focus just on rent and housing. Read more on rent assistance in [place] region.  Legal assistance for evictions, foreclosure, and more If you need free legal advice on civili issues, such as foreclosures, evictions, Electronics engineer, government programs, domestic issues and more, Armed forces operational officer Aid of [facility]) is a Associate Professor firm that provides free legal services and counsel to lower income people, seniors, disabled, and others. The goal is to ensure everyone has access to justice and fair representation.  Call them at ([phone], or click here to learn more about [place] free legal assistance programs.  [place] Avnet and funds for emergency expenses The [employer] Stores is another organization that can provide people with Deere & Company and funds to pay bills. Their assistance depends on funding, and the demand for help is always very high. They can provide cash to help pay rent, a missed mortgage payment, or gas, electric, and water bills. But the assistance doesn't stop there. They also have a food pantry  on site, which can provide  food once every three (3) months to people who need help. The [employer] can also offer a Engineering geologist once every three (3) months for a maximum three (3) times. After receiving this voucher over that period of time, applicants can receive this aid one every six (6) months after that. [phone].  Kohl's action agency The Intel, Avnet. offers job and Dispensing optician. Resources are focused on helping students obtain the skills and experiences that are necessary to compete in today's challenging and tight job market. The non-profit faith-based community action agency offers internship trainings as well as classroom instruction. Economically disadvantaged and challenged individuals and potential employers can use their services. Classes are tailored to meet the needs of people in the [facility] region. [place], [place] [address], [phone]    Foreclosure prevention services Housing Counseling and Education is also offered by [employer] of the [place]. The agency (phone number is below) is a Engineer, structural providing foreclosure advice and counseling. They offer mortgage resolution counseling and also reverse mortgage counseling. Counselors can direct people to both Kimberly[employer]Clark, as well as Weyerhaeuser Company foreclosure assistance options.  Warehouse manager has locations in [place] and [employer]. They run debt and foreclosure prevention programs for local families. A sampling of the programs offered include both Budget and Housing Counseling. This includes money management, financial advice, budget review and development of a written action plan with a Pensions consultant to help solve specific individual financial problems. In addition, housing and mortgage counselors can also provide pre- and post-purchase  homeownership counseling, default resolution counseling (to prevent foreclosure) and reverse mortgage counseling. A Debt Management Program allows people and families with a high level of credit card or medical debt to consolidate and repay consumer debt and loans to creditors and rebuild positive credit ratings and scores. [phone]  Debt assistance programs Receive free counseling and debt help from [facility] of the [place]. The [facility] based agency can be reached at [phone]. The counselors provide free help, and the services include budget counseling. This will help people manage their expenses and set goals. They also offer a Forensic scientist, which will help individuals consolidate their debts and become debt free. Most of the workshops and services are free.  Community clinics in [place] of the leading health and dental centers are listed below. They may be able to provide medication, physicals, dental care, and general family care to residents of all incomes and backgrounds across the region. Some of the programs focus on the low income and underinsured. However if these clinics can't meet your needs, find information and details on more clinics in [facility].  Some of the options include [employer] of [employer]. This center provides free or low cost health care to low-income adults 18 - 64, who have no health insurance. Among other services offered include a pharmacy and eye clinic. Phone [phone]  [facility], which is located in [place], is a community clinic that provides primary medical and health care to uninsured and underinsured adults and families, as well as the low income, in the greater [place] area on a sliding-fee scale. Call [phone]  [place] Adult [facility] - They run a dental assistance program that is organized by [facility] to provide dental services  and aid  to [employer]. Services offered by the dental clinic are limited to extractions, pain management, and minor restorative care. [phone]  [place] [facility] has locations in [facility] and [place]. The community clinics provide complete pediatric care including primary health, mental health, social work, neurology, cardiology, asthma. Dial [phone].  In addition to those [address] and [employer], find other free community clinics in [place] and across the county.  Food pantry and assistance Some of the local food pantries and distribution centers to call for free food and groceries include The Hive of [place] (phone [phone]), The [facility] (phone [phone]) and also [employer] Corporation. Dial [phone].  Several other food banks in the region provide clothing, free food and meals, access to soup kitchens and other help. Find the addresses and phone numbers of more food pantries in [place]. http://www.needhelppayingbills.com/html/[place]_assistance_pro.html   Northrop Grumman The United Way's "B3979455" is a great source of information about community services available.  Access by dialing 2-1-1 from anywhere in [place], or by website -  PooledIncome.pl.  [facility]) now has a full time staff to take referrals for all individuals needing shelter or housing placement. They do not do direct services or have beds, but are in charge of assessing and coordinating placement for individuals needing shelter. The phone number for coordinated entry is [phone].    Other Armed forces technical officer Number and Address  [facility] Housing for homeless and needy men with substance abuse issues 5 day Covid Quarantine [phone] [place], [place]  Goldman Sachs of [place] Emergency  assistance for [employer] only Ingram [employer] [phone] [address] [place], [place]  Clara [employer] Corporation of the [place] Domestic violence shelter for women and their children [phone] [place], [place]  [facility] Services Domestic violence shelter for women and their children Each family gets their own unit and can quarantine after admission. [phone] [place], [place]  Interactive Resource Center [facility]) / Resources for the [employer] center for the homeless Information and referral to housing resources Counseling Showers Laundry Barbershop Phone bank Mailroom Computer lab Medical clinic Bike maintenance center 14 Day covid quarantine [phone] [address] [place], [place]  [facility] - [employer] Men's Shelter Emergency housing Food Emergency financial assistance Permanent supportive housing [phone] [address] [place], [place]  The Pathmark Stores Crisis assistance Medication Housing Food Utility assistance ([phone])098-5429 [address] [place], [place]   [phone] [address] [place], [place]  The Monsanto Company of [place]       Transitional housing Case Chartered certified accountant assistance [phone] S[address] [place], [place]  [facility], [employer]es for adult men and women Can admit with MD clearance from the hospital after positive covid test.  Intake Hotline [phone] [address] [place], [place]  24-hour Crisis Line for those Facing Homelessness   Information and referral to community resources [phone]  [employer] and additional resources. Can admit with MD clearance after positive covid test.  Prefer online applications (blocked on Cone computers)/ currently full. Will be able to do intake at the office starting in May.  [phone] Admin only  location    Low Income [employer] Supported Living Apts [phone] [address]  [facility] [phone]  [facility] Courts [address], [place] [phone]  [facility] Ind [address] [place] [phone]  [facility] [address] [place]. [place] [phone]  [place] [address] [place] [phone]  [place] Apts[address]  [address] [place] [phone]  Dorothe Pea Homes [address] [place] [phone]  [facility] [address] [place] [phone]  [facility] [address] [place] [phone]  THP Apts[address],  [facility] [place] [phone]  [facility]  [address] [place] [phone]  [place] Apts.  [address] [facility] [address] [place] [phone]   [place] Apts[address] [place] [phone]  [place] Housing [address] [place] [phone] [address] [place] [phone]  [facility] Apts. [address], [place] [phone]  [place] Apts[address] [facility] [address] [place] [phone]  [facility] Apts. [address] [place] [phone]  Rockwell [employer]ation Apts.  [address] [place] [phone]  [facility] Apts. [address], [place] [phone] Apts.  [address] [phone]  [facility] Apts. [address] [place] [phone]  [place] Apts[address]  [address] [facility] [phone]

## 2023-07-24 NOTE — Assessment & Plan Note (Signed)
07-24-2023 baseline SCR 1.3 from [facility] where labs.  AKI has resolved with IVF.

## 2023-07-24 NOTE — Assessment & Plan Note (Signed)
07-24-2023 see RD documentation from today. "Moderate Malnutrition related to chronic illness as evidenced by mild fat depletion, moderate muscle depletion. "

## 2023-07-24 NOTE — Progress Notes (Signed)
[facility]  Date of Admission:  07/20/2023      Total days of antibiotics 4   Ertapenem          ASSESSMENT: Jacob Gutierrez is a 37 y.o. male admitted with   Fevers -  Ascending Urinary Tract Infection -  Prostate Fluid Collection -  Fevers have resolved. Has ESBL producing E coli in urine. CT scan revealed ascending GU infection involving both ureters and prostate fluid collection. -Please see if urology has anything to add re: CT results.  -Will need to continue with IV ertapenem for 14d through 08/03/2023 given non-OPAT candidate w/o housing.  -Symptomatically improved but still with some residual dysuria  URI -  Non-COVID19 Coronavirus - Improved with supportive care.   Uncontrolled HIV, AIDS ( + ) - Had trouble getting medications in [place] likely d/t inability to get state Medicaid (still has [place] Medicaid).  Does not sound like he was seen and local hospital had no resources to organize his care outpatient.  -Continue symtuza with food daily.  -Continue dapsone for prophylaxis.  -[facility] referral  -Will re-adjust his OP follow up with [name] in 6 weeks.  -FU G6PD  Homeless -  Has mother around here in [place] but unclear where he will go after hospitalization. Staying with family friend after recent return from [place], had several hospital care encounters while out of state.  -Follow inpatient TOC recs.   AKI -  Improved 1.76 >> 1.29 today    PLAN: Continue ertapenem  Please see if urology can review case and provide recs (non-urgent)  Will need to stay in house on IV ertapenem through 1/9  Continue symtuza + dapsone daily    Principal Problem:   UTI due to extended-spectrum beta lactamase (ESBL) producing Escherichia coli Active Problems:   Homelessness   Pancytopenia (HCC)   Coronavirus infection   AKI (acute kidney injury) (HCC)   Sepsis due to Escherichia coli (E. coli) (HCC)   Neutropenia with fever (HCC)   Symptomatic HIV  infection (HCC)   Febrile illness   Leukopenia    cyanocobalamin  1,000 mcg Intramuscular Weekly   dapsone  100 mg Oral Daily   Darunavir-Cobicistat-Emtricitabine-Tenofovir Alafenamide  1 tablet Oral Q breakfast   enoxaparin (LOVENOX) injection  40 mg Subcutaneous Q24H   ferrous sulfate  325 mg Oral BID WC   midodrine  5 mg Oral TID WC   pantoprazole  40 mg Oral Daily   senna-docusate  2 tablet Oral BID   sodium bicarbonate  1,300 mg Oral BID   tamsulosin  0.4 mg Oral QPC supper    SUBJECTIVE: Feels better today. Peeing way better than when he came in. No new pain or concerns.  Fevers seem gone.   Review of Systems: Review of Systems  Constitutional:  Negative for chills and fever.  Respiratory:  Negative for cough.   Cardiovascular:  Negative for chest pain.  Gastrointestinal:  Negative for abdominal pain, diarrhea, nausea and vomiting.  Genitourinary:  Positive for dysuria. Negative for flank pain, frequency and urgency.  Neurological:  Negative for dizziness.    Allergies  Allergen Reactions   Vancomycin Anaphylaxis, Itching, Swelling and Other (See Comments)    Angioedema and "everything swells"   Amoxicillin Other (See Comments)    From childhood: "I had a reaction when i was little." (??) Has tolerated multiple cephalosporins in the past    OBJECTIVE: Vitals:   07/23/23 1804  07/23/23 2159 07/24/23 0247 07/24/23 0733  BP: (!) 97/59 104/62 96/61 98/64   Pulse: 65 71 87 77  Resp: 16 18 17 16   Temp:  98.3 F (36.8 C) 98.1 F (36.7 C) 98 F (36.7 C)  TempSrc:  Oral Oral Oral  SpO2: 100% 100% 97% 100%  Weight:      Height:       Body mass index is 24.29 kg/m.  Physical Exam Vitals reviewed.  Constitutional:      General: He is not in acute distress.    Appearance: He is ill-appearing.     Comments: Thin appearing, facial lipoatrophy   HENT:     Head: Normocephalic.     Mouth/Throat:     Mouth: Mucous membranes are moist.     Pharynx: Oropharynx is  clear.  Eyes:     General: No scleral icterus. Cardiovascular:     Rate and Rhythm: Normal rate and regular rhythm.  Pulmonary:     Effort: Pulmonary effort is normal.  Musculoskeletal:        General: Normal range of motion.     Cervical back: Normal range of motion.  Skin:    Coloration: Skin is not jaundiced or pale.  Neurological:     Mental Status: He is oriented to person, place, and time.  Psychiatric:        Mood and Affect: Mood normal.        Judgment: Judgment normal.     Lab Results Lab Results  Component Value Date   WBC 1.4 (LL) 07/22/2023   HGB 8.5 (L) 07/22/2023   HCT 25.3 (L) 07/22/2023   MCV 90.7 07/22/2023   PLT 118 (L) 07/22/2023    Lab Results  Component Value Date   CREATININE 1.29 (H) 07/24/2023   BUN 11 07/24/2023   NA 137 07/24/2023   K 3.7 07/24/2023   CL 108 07/24/2023   CO2 22 07/24/2023    Lab Results  Component Value Date   ALT 82 (H) 07/24/2023   AST 182 (H) 07/24/2023   ALKPHOS 50 07/24/2023   BILITOT 0.3 07/24/2023     Microbiology: Recent Results (from the past 240 hours)  Culture, blood (Routine x 2)     Status: None (Preliminary result)   Collection Time: 07/20/23  1:10 PM   Specimen: BLOOD LEFT HAND  Result Value Ref Range Status   Specimen Description BLOOD LEFT HAND  Final   Special Requests   Final    BOTTLES DRAWN AEROBIC AND ANAEROBIC Blood Culture results may not be optimal due to an inadequate volume of blood received in culture bottles   Culture   Final    NO GROWTH 4 DAYS Performed at [facility], [address], [place], [place] [address]    Report Status PENDING  Incomplete  Urine Culture     Status: Abnormal   Collection Time: 07/20/23  3:13 PM   Specimen: Urine, Clean Catch  Result Value Ref Range Status   Specimen Description URINE, CLEAN CATCH  Final   Special Requests   Final    NONE Reflexed from [id number] Performed at [facility], [address] [place], [place] [address]    Culture  (A)  Final    >=100,000 COLONIES/mL ESCHERICHIA COLI CORRECTED ON 12/30 AT 9629: PREVIOUSLY REPORTED AS >=100,000 COLONIES/mL GRAM NEGATIVE RODS Confirmed Extended Spectrum Beta-Lactamase Producer (ESBL).  In bloodstream infections from ESBL organisms, carbapenems are preferred over piperacillin/tazobactam. They are shown to have a  lower risk of mortality.    Report Status 07/24/2023 FINAL  Final   Organism ID, Bacteria ESCHERICHIA COLI (A)  Final      Susceptibility   Escherichia coli - MIC*    AMPICILLIN >=32 RESISTANT Resistant     CEFAZOLIN >=64 RESISTANT Resistant     CEFEPIME 16 RESISTANT Resistant     CEFTRIAXONE >=64 RESISTANT Resistant     CIPROFLOXACIN >=4 RESISTANT Resistant     GENTAMICIN <=1 SENSITIVE Sensitive     IMIPENEM <=0.25 SENSITIVE Sensitive     NITROFURANTOIN <=16 SENSITIVE Sensitive     TRIMETH/SULFA >=320 RESISTANT Resistant     AMPICILLIN/SULBACTAM 4 SENSITIVE Sensitive     PIP/TAZO <=4 SENSITIVE Sensitive ug/mL    * >=100,000 COLONIES/mL ESCHERICHIA COLI CORRECTED ON 12/30 AT 1308: PREVIOUSLY REPORTED AS >=100,000 COLONIES/mL GRAM NEGATIVE RODS  Respiratory (~20 pathogens) panel by PCR     Status: Abnormal   Collection Time: 07/20/23  6:06 PM   Specimen: Nasopharyngeal Swab; Respiratory  Result Value Ref Range Status   Adenovirus NOT DETECTED NOT DETECTED Final   Coronavirus 229E NOT DETECTED NOT DETECTED Final    Comment: (NOTE) The Coronavirus on the Respiratory Panel, DOES NOT test for the novel  Coronavirus (2019 nCoV)    Coronavirus HKU1 NOT DETECTED NOT DETECTED Final   Coronavirus NL63 NOT DETECTED NOT DETECTED Final   Coronavirus OC43 DETECTED (A) NOT DETECTED Final   Metapneumovirus NOT DETECTED NOT DETECTED Final   Rhinovirus / Enterovirus NOT DETECTED NOT DETECTED Final   Influenza A NOT DETECTED NOT DETECTED Final   Influenza B NOT DETECTED NOT DETECTED Final   Parainfluenza Virus 1 NOT DETECTED NOT DETECTED Final   Parainfluenza Virus 2  NOT DETECTED NOT DETECTED Final   Parainfluenza Virus 3 NOT DETECTED NOT DETECTED Final   Parainfluenza Virus 4 NOT DETECTED NOT DETECTED Final   Respiratory Syncytial Virus NOT DETECTED NOT DETECTED Final   Bordetella pertussis NOT DETECTED NOT DETECTED Final   Bordetella Parapertussis NOT DETECTED NOT DETECTED Final   Chlamydophila pneumoniae NOT DETECTED NOT DETECTED Final   Mycoplasma pneumoniae NOT DETECTED NOT DETECTED Final    Comment: Performed at [facility], [address] [place], [place] [address]  MRSA Next Gen by PCR, Nasal     Status: None   Collection Time: 07/21/23 11:47 AM   Specimen: Nasal Mucosa; Nasal Swab  Result Value Ref Range Status   MRSA by PCR Next Gen NOT DETECTED NOT DETECTED Final    Comment: (NOTE) The GeneXpert MRSA Assay (FDA approved for NASAL specimens only), is one component of a comprehensive MRSA colonization surveillance program. It is not intended to diagnose MRSA infection nor to guide or monitor treatment for MRSA infections. Test performance is not FDA approved in patients less than 11 years old. Performed at [facility], [address], [place], [place] [address]      Rexene Alberts, MSN, NP-C [facility]  [email or web address] Pager: [phone] Office: [phone] [facility] Main Line: [phone] *Secure Chat Communication Welcome

## 2023-07-24 NOTE — Progress Notes (Addendum)
Initial Nutrition Assessment  DOCUMENTATION CODES:   Non-severe (moderate) malnutrition in context of chronic illness  INTERVENTION:  - Continue Regular diet and encourage PO intake  - Ensure Enlive po BID, each supplement provides 350 kcal and 20 grams of protein. - x2 proteins with meals  - 100 mg thiamine x 5 days - MVI with minerals daily   NUTRITION DIAGNOSIS:   Moderate Malnutrition related to chronic illness as evidenced by mild fat depletion, moderate muscle depletion.  GOAL:   Patient will meet greater than or equal to 90% of their needs   MONITOR:   PO intake, Weight trends, Supplement acceptance  REASON FOR ASSESSMENT:   Consult Assessment of nutrition requirement/status  ASSESSMENT:  37 y.o Male with PMH of HIV/AIDS, medication noncompliance, syphilis, gonorrhea, monkeypox, ESBL E. coli UTI, HTN, pulmonary coccidiomycosis ,gastroenteritis, AIDS, remote substance abuse. Presents with dysuria, dark urine, fever, and chills. Admitted for sepsis secondary to UTI.  Pt lives in [place] and has a history of UTIs and prostatitis. He traveled to [place] for the holidays and was found to have sepsis due to UTI discovered last month while he was in [place].   Pt states he has not had much to eat since 12/24 due to being nauseas. Since 12/27 he reports his appetite increasing and has been eating 100% of his meals. Pt acknowledges recent weight loss, he has lost 26 lbs, 12% weight loss in 9 months. Pt states this is due to stress and not eating as often in [place]. The number of meals he would eat would vary and he occasionally drank Ensures. No noted N/V.  Admit weight: 83.5 kg Current weight: 83.5 kg    07/21/23 83.5 kg  10/18/22 95.3 kg  01/20/22 95.7 kg  12/15/21 93.9 kg  11/15/21 89.4 kg  07/27/21 76.6 kg    Average Meal Intake: 12/27-12/28: 100% intake x 3 recorded meals  Nutritionally Relevant Medications: Scheduled Meds:  cyanocobalamin  1,000 mcg  Intramuscular Weekly   dapsone  100 mg Oral Daily   Darunavir-Cobicistat-Emtricitabine-Tenofovir Alafenamide  1 tablet Oral Q breakfast   enoxaparin (LOVENOX) injection  40 mg Subcutaneous Q24H   ferrous sulfate  325 mg Oral BID WC   midodrine  5 mg Oral TID WC   pantoprazole  40 mg Oral Daily   senna-docusate  2 tablet Oral BID   sodium bicarbonate  1,300 mg Oral BID   tamsulosin  0.4 mg Oral QPC supper   Continuous Infusions:  ertapenem 1 g (07/24/23 0948)    Labs Reviewed: Creatinine 1.29, Calcium 8,   NUTRITION - FOCUSED PHYSICAL EXAM:  Flowsheet Row Most Recent Value  Orbital Region Mild depletion  Upper Arm Region No depletion  Thoracic and Lumbar Region No depletion  Buccal Region Mild depletion  Temple Region Moderate depletion  Clavicle Bone Region Moderate depletion  Clavicle and Acromion Bone Region Moderate depletion  Scapular Bone Region Moderate depletion  Dorsal Hand Mild depletion  Patellar Region Mild depletion  Anterior Thigh Region Mild depletion  Posterior Calf Region Mild depletion  Edema (RD Assessment) None  Hair Reviewed  Eyes Reviewed  Mouth Reviewed  Skin Reviewed  Nails Reviewed       Diet Order:   Diet Order             Diet regular Fluid consistency: Thin  Diet effective now                   EDUCATION NEEDS:   Education needs have  been addressed  Skin:  Skin Assessment: Reviewed RN Assessment  Last BM:  07/21/2023  Height:   Ht Readings from Last 1 Encounters:  07/21/23 6\' 1"  (1.854 m)    Weight:   Wt Readings from Last 1 Encounters:  07/21/23 83.5 kg    Ideal Body Weight:  83.64 kg  BMI:  Body mass index is 24.29 kg/m.  Estimated Nutritional Needs:   Kcal:  2300-2500 kcal  Protein:  100-110 gm  Fluid:  >2 L   Elliot Dally, RD Registered Dietitian  See Amion for more information

## 2023-07-24 NOTE — Telephone Encounter (Signed)
Patient Product/process development scientist completed.    The patient is insured through [facility]. Patient has Medicare and is not eligible for a copay card, but may be able to apply for patient assistance, if available.    Ran test claim for Symtuza [id number] mg and the current 30 day co-pay is $0.00.   This test claim was processed through [facility]- copay amounts may vary at other pharmacies due to pharmacy/plan contracts, or as the patient moves through the different stages of their insurance plan.     Roland Earl, CPHT Pharmacy Technician III Certified Patient Advocate [facility] Patient Advocate Team Direct Number: [phone]  Fax: [phone]

## 2023-07-24 NOTE — Progress Notes (Signed)
   ID asked me to reach out to urology. They have recommended foley catheter if post-void residual is >150 ml.  Pt has had prior prostate abscess I&D in 06-2017. And prior TURP in 11-2017.  Carollee Herter, DO [facility]

## 2023-07-24 NOTE — Hospital Course (Signed)
HPI: Jacob Gutierrez is a 37 y.o. male with medical history significant of HIV/AIDS, medication noncompliance, remote substance abuse, pulmonary coccidiomycosis, syphilis, gonorrhea, monkeypox, ESBL E. coli UTI and HTN who presents to the ED for evaluation of dysuria, fevers and chills. Patient reports he is followed by the [facility] here in [place] but he moved to [place] and has been out of his HIV medications since June/July. He has had a history of UTIs and prostatitis and was recently admitted for sepsis due to UTI last month in [place]. He came to [facility] for the holidays and has continued to have urinary symptoms including difficulty urinating, pain with urination and dark urine. Over the last 3-4 days, he has had intermittent fevers and sweats. He also endorsed mild dyspnea on exertion and fatigue but denies any cough, headaches, dizziness, penile discharge, abdominal pain, shortness of breath or chest pain. He is sexually active and his last sexual encounter was on unprotected sexual intercourse with a male about a month ago. He reports prior history of substance abuse but has been abstinent for years.   Significant Events: Admitted 07/20/2023 for UTI   Significant Labs: sodium 129, K+ 3.9, bicarb 19, creatinine 1.76, albumin 2.7, WBC 1.9, Hgb 10.7, platelet 130, PT/INR 15.0/1.2, glucose 86, lactic acid 0.6, UA shows significant hemoglobinuria, proteinuria, positive nitrite, moderate leuks, RBC >50, WBC >50, and many bacteria  Urine Cx with ESBL E. coli  Significant Imaging Studies: Chest x-ray with no active cardiopulmonary disease   Antibiotic Therapy: Anti-infectives (From admission, onward)    Start     Dose/Rate Route Frequency Ordered Stop   07/23/23 1700  Darunavir-Cobicistat-Emtricitabine-Tenofovir Alafenamide (SYMTUZA) 800-150-200-10 MG TABS 1 tablet        1 tablet Oral Daily with breakfast 07/23/23 1538     07/23/23 1630  dapsone tablet 100 mg        100 mg Oral Daily 07/23/23  1538     07/21/23 1200  ertapenem (INVANZ) 1 g in sodium chloride 0.9 % 100 mL IVPB        1 g 200 mL/hr over 30 Minutes Intravenous Every 24 hours 07/21/23 1101     07/21/23 1000  metroNIDAZOLE (FLAGYL) IVPB 500 mg  Status:  Discontinued        500 mg 100 mL/hr over 60 Minutes Intravenous Every 12 hours 07/20/23 2202 07/21/23 1101   07/21/23 0800  linezolid (ZYVOX) IVPB 600 mg  Status:  Discontinued        600 mg 300 mL/hr over 60 Minutes Intravenous Every 12 hours 07/20/23 2202 07/23/23 1415   07/20/23 2300  ceFEPIme (MAXIPIME) 2 g in sodium chloride 0.9 % 100 mL IVPB  Status:  Discontinued        2 g 200 mL/hr over 30 Minutes Intravenous Every 8 hours 07/20/23 2205 07/21/23 1101   07/20/23 1445  linezolid (ZYVOX) IVPB 600 mg        600 mg 300 mL/hr over 60 Minutes Intravenous  Once 07/20/23 1437 07/20/23 1829   07/20/23 1430  aztreonam (AZACTAM) 2 g in sodium chloride 0.9 % 100 mL IVPB  Status:  Discontinued        2 g 200 mL/hr over 30 Minutes Intravenous  Once 07/20/23 1417 07/20/23 1419   07/20/23 1430  metroNIDAZOLE (FLAGYL) IVPB 500 mg        500 mg 100 mL/hr over 60 Minutes Intravenous  Once 07/20/23 1417 07/20/23 1727   07/20/23 1430  ceFEPIme (MAXIPIME) 2 g in sodium chloride  0.9 % 100 mL IVPB        2 g 200 mL/hr over 30 Minutes Intravenous  Once 07/20/23 1419 07/20/23 1557       Procedures:   Consultants:

## 2023-07-24 NOTE — Assessment & Plan Note (Addendum)
07-25-2023 related to his untreated HIV.

## 2023-07-24 NOTE — Assessment & Plan Note (Addendum)
07-24-2023 related to his untreated HIV, coronavirus infection and ESBL e. Coli infection. 07-25-2023 he is afebrile now.

## 2023-07-24 NOTE — Progress Notes (Signed)
Pt ambulated independently to bathroom to void. Post void residual 8mL. Carollee Herter, DO notified.

## 2023-07-24 NOTE — Progress Notes (Addendum)
PROGRESS NOTE    Jacob Gutierrez  ZOX:[id number] DOB: 20-Aug-1985 DOA: 07/20/2023 PCP: No primary care provider on file.  Subjective: Pt seen and examined. On RA.   Hospital Course: HPI: Jacob Gutierrez is a 37 y.o. male with medical history significant of HIV/AIDS, medication noncompliance, remote substance abuse, pulmonary coccidiomycosis, syphilis, gonorrhea, monkeypox, ESBL E. coli UTI and HTN who presents to the ED for evaluation of dysuria, fevers and chills. Patient reports he is followed by the [facility] here in [place] but he moved to [place] and has been out of his HIV medications since June/July. He has had a history of UTIs and prostatitis and was recently admitted for sepsis due to UTI last month in [place]. He came to [facility] for the holidays and has continued to have urinary symptoms including difficulty urinating, pain with urination and dark urine. Over the last 3-4 days, he has had intermittent fevers and sweats. He also endorsed mild dyspnea on exertion and fatigue but denies any cough, headaches, dizziness, penile discharge, abdominal pain, shortness of breath or chest pain. He is sexually active and his last sexual encounter was on unprotected sexual intercourse with a male about a month ago. He reports prior history of substance abuse but has been abstinent for years.   Significant Events: Admitted 07/20/2023 for UTI   Significant Labs: sodium 129, K+ 3.9, bicarb 19, creatinine 1.76, albumin 2.7, WBC 1.9, Hgb 10.7, platelet 130, PT/INR 15.0/1.2, glucose 86, lactic acid 0.6, UA shows significant hemoglobinuria, proteinuria, positive nitrite, moderate leuks, RBC >50, WBC >50, and many bacteria  Urine Cx with ESBL E. coli  Significant Imaging Studies: Chest x-ray with no active cardiopulmonary disease   Antibiotic Therapy: Anti-infectives (From admission, onward)    Start     Dose/Rate Route Frequency Ordered Stop   07/23/23 1700   Darunavir-Cobicistat-Emtricitabine-Tenofovir Alafenamide (SYMTUZA) 800-150-200-10 MG TABS 1 tablet        1 tablet Oral Daily with breakfast 07/23/23 1538     07/23/23 1630  dapsone tablet 100 mg        100 mg Oral Daily 07/23/23 1538     07/21/23 1200  ertapenem (INVANZ) 1 g in sodium chloride 0.9 % 100 mL IVPB        1 g 200 mL/hr over 30 Minutes Intravenous Every 24 hours 07/21/23 1101     07/21/23 1000  metroNIDAZOLE (FLAGYL) IVPB 500 mg  Status:  Discontinued        500 mg 100 mL/hr over 60 Minutes Intravenous Every 12 hours 07/20/23 2202 07/21/23 1101   07/21/23 0800  linezolid (ZYVOX) IVPB 600 mg  Status:  Discontinued        600 mg 300 mL/hr over 60 Minutes Intravenous Every 12 hours 07/20/23 2202 07/23/23 1415   07/20/23 2300  ceFEPIme (MAXIPIME) 2 g in sodium chloride 0.9 % 100 mL IVPB  Status:  Discontinued        2 g 200 mL/hr over 30 Minutes Intravenous Every 8 hours 07/20/23 2205 07/21/23 1101   07/20/23 1445  linezolid (ZYVOX) IVPB 600 mg        600 mg 300 mL/hr over 60 Minutes Intravenous  Once 07/20/23 1437 07/20/23 1829   07/20/23 1430  aztreonam (AZACTAM) 2 g in sodium chloride 0.9 % 100 mL IVPB  Status:  Discontinued        2 g 200 mL/hr over 30 Minutes Intravenous  Once 07/20/23 1417 07/20/23 1419   07/20/23 1430  metroNIDAZOLE (FLAGYL) IVPB 500 mg  500 mg 100 mL/hr over 60 Minutes Intravenous  Once 07/20/23 1417 07/20/23 1727   07/20/23 1430  ceFEPIme (MAXIPIME) 2 g in sodium chloride 0.9 % 100 mL IVPB        2 g 200 mL/hr over 30 Minutes Intravenous  Once 07/20/23 1419 07/20/23 1557       Procedures:   Consultants:     Assessment and Plan: * UTI due to extended-spectrum beta lactamase (ESBL) producing Escherichia coli Since admission, in the setting of immunocompromised state due to Hiv/aids and non compliance to meds.  - continue with broad spectrum antibiotics. Currently on Ertapenem , Linezolid discontinued.  - urine cultures growing  >100K   ESBL. Sensitive to ertapenem.   07-24-2023 remains on IV Invanz. Day #4. Awaiting ID f/u note.  Sepsis due to Escherichia coli (E. coli) (HCC) 07-24-2023 present on admission. On admission, temp 101.7, RR 22, HR 104. UA shows significant hemoglobinuria, proteinuria, positive nitrite, moderate leuks, RBC >50, WBC >50, and many bacteria.  Sepsis has resolved with treatment of his E. Coli UTI.  Febrile illness 07-24-2023 related to his untreated HIV, coronavirus infection and ESBL e. Coli infection.  Symptomatic HIV infection (HCC) 07-24-2023 seen by ID. Started on Sumtuza, dapsone for PJP prophylaxis.  Neutropenia with fever (HCC) 07-24-2023 related to his untreated HIV.  AKI (acute kidney injury) (HCC) 07-24-2023 baseline SCR 1.3 from [facility] where labs.  AKI has resolved with IVF.  Coronavirus infection 07-24-2023 Not covid-19.  Continue with supportive care. On RA.  Pancytopenia (HCC) 07-24-2023 related to his untreated HIV.  Homelessness 07-24-2023 pt states his mother lives in [place]. TOC consulted for assistance in housing. Pt just returned from [place] where he was staying with family friend.  Malnutrition of moderate degree 07-24-2023 see RD documentation from today. "Moderate Malnutrition related to chronic illness as evidenced by mild fat depletion, moderate muscle depletion. "       DVT prophylaxis: enoxaparin (LOVENOX) injection 40 mg Start: 07/20/23 2200    Code Status: Full Code Family Communication: no family at bedside Disposition Plan: homeless shelter vs home with his mother Reason for continuing need for hospitalization: remains on IV Invanz.  Objective: Vitals:   07/23/23 2159 07/24/23 0247 07/24/23 0733 07/24/23 1536  BP: 104/62 96/61 98/64  (!) 102/58  Pulse: 71 87 77 94  Resp: 18 17 16 16   Temp: 98.3 F (36.8 C) 98.1 F (36.7 C) 98 F (36.7 C) 98.3 F (36.8 C)  TempSrc: Oral Oral Oral Oral  SpO2: 100% 97% 100% 100%  Weight:      Height:         Intake/Output Summary (Last 24 hours) at 07/24/2023 1727 Last data filed at 07/24/2023 1400 Gross per 24 hour  Intake 720 ml  Output 375 ml  Net 345 ml   Filed Weights   07/21/23 0900 07/21/23 1528  Weight: 84.6 kg 83.5 kg    Examination:  Physical Exam Vitals and nursing note reviewed.  HENT:     Head: Normocephalic and atraumatic.     Nose: Nose normal.  Cardiovascular:     Rate and Rhythm: Normal rate and regular rhythm.  Pulmonary:     Effort: Pulmonary effort is normal.     Breath sounds: Normal breath sounds.  Abdominal:     General: Abdomen is flat. Bowel sounds are normal.  Musculoskeletal:     Right lower leg: No edema.     Left lower leg: No edema.  Skin:    General: Skin is  warm and dry.     Capillary Refill: Capillary refill takes less than 2 seconds.  Neurological:     Mental Status: He is alert and oriented to person, place, and time.     Data Reviewed: I have personally reviewed following labs and imaging studies  CBC: Recent Labs  Lab 07/20/23 1310 07/21/23 0750 07/22/23 0610  WBC 1.9* 1.8* 1.4*  NEUTROABS 0.8*  --  1.2*  HGB 10.7* 9.7* 8.5*  HCT 31.8* 29.1* 25.3*  MCV 90.6 90.7 90.7  PLT 130* 120* 118*   Basic Metabolic Panel: Recent Labs  Lab 07/21/23 0750 07/21/23 1159 07/22/23 0610 07/23/23 0554 07/24/23 0614  NA 128* 130* 131* 135 137  K 3.7 3.9 3.7 4.0 3.7  CL 102 103 104 108 108  CO2 18* 19* 22 20* 22  GLUCOSE 114* 140* 88 103* 99  BUN 13 13 10 13 11   CREATININE 1.57* 1.60* 1.49* 1.37* 1.29*  CALCIUM 7.4* 7.6* 7.6* 8.0* 8.0*  MG  --   --  1.8  --   --    GFR: Estimated Creatinine Clearance: 86.9 mL/min (A) (by C-G formula based on SCr of 1.29 mg/dL (H)). Liver Function Tests: Recent Labs  Lab 07/20/23 1310 07/21/23 1159 07/22/23 0610 07/23/23 0554 07/24/23 0614  AST 20 19 16 28  182*  ALT 16 11 11 14  82*  ALKPHOS 48 40 39 39 50  BILITOT 1.0 0.5 0.5 0.5 0.3  PROT 9.8* 7.7 7.2 7.9 7.7  ALBUMIN 2.7* 1.9*  1.9* 2.0* 2.1*   Coagulation Profile: Recent Labs  Lab 07/20/23 1310  INR 1.2   Thyroid Function Tests: Recent Labs    07/23/23 0554  TSH 2.810   Sepsis Labs: Recent Labs  Lab 07/20/23 1322 07/20/23 1506  LATICACIDVEN 0.6 1.0    Recent Results (from the past 240 hours)  Culture, blood (Routine x 2)     Status: None (Preliminary result)   Collection Time: 07/20/23  1:10 PM   Specimen: BLOOD LEFT HAND  Result Value Ref Range Status   Specimen Description BLOOD LEFT HAND  Final   Special Requests   Final    BOTTLES DRAWN AEROBIC AND ANAEROBIC Blood Culture results may not be optimal due to an inadequate volume of blood received in culture bottles   Culture   Final    NO GROWTH 4 DAYS Performed at [facility], [address] [place], [place] [address]    Report Status PENDING  Incomplete  Urine Culture     Status: Abnormal   Collection Time: 07/20/23  3:13 PM   Specimen: Urine, Clean Catch  Result Value Ref Range Status   Specimen Description URINE, CLEAN CATCH  Final   Special Requests   Final    NONE Reflexed from [id number] Performed at [facility], [address] [place], [place] [address]    Culture (A)  Final    >=100,000 COLONIES/mL ESCHERICHIA COLI CORRECTED ON 12/30 AT 0981: PREVIOUSLY REPORTED AS >=100,000 COLONIES/mL GRAM NEGATIVE RODS Confirmed Extended Spectrum Beta-Lactamase Producer (ESBL).  In bloodstream infections from ESBL organisms, carbapenems are preferred over piperacillin/tazobactam. They are shown to have a lower risk of mortality.    Report Status 07/24/2023 FINAL  Final   Organism ID, Bacteria ESCHERICHIA COLI (A)  Final      Susceptibility   Escherichia coli - MIC*    AMPICILLIN >=32 RESISTANT Resistant     CEFAZOLIN >=64 RESISTANT Resistant     CEFEPIME 16 RESISTANT Resistant  CEFTRIAXONE >=64 RESISTANT Resistant     CIPROFLOXACIN >=4 RESISTANT Resistant     GENTAMICIN <=1 SENSITIVE Sensitive     IMIPENEM <=0.25  SENSITIVE Sensitive     NITROFURANTOIN <=16 SENSITIVE Sensitive     TRIMETH/SULFA >=320 RESISTANT Resistant     AMPICILLIN/SULBACTAM 4 SENSITIVE Sensitive     PIP/TAZO <=4 SENSITIVE Sensitive ug/mL    * >=100,000 COLONIES/mL ESCHERICHIA COLI CORRECTED ON 12/30 AT 1610: PREVIOUSLY REPORTED AS >=100,000 COLONIES/mL GRAM NEGATIVE RODS  Respiratory (~20 pathogens) panel by PCR     Status: Abnormal   Collection Time: 07/20/23  6:06 PM   Specimen: Nasopharyngeal Swab; Respiratory  Result Value Ref Range Status   Adenovirus NOT DETECTED NOT DETECTED Final   Coronavirus 229E NOT DETECTED NOT DETECTED Final    Comment: (NOTE) The Coronavirus on the Respiratory Panel, DOES NOT test for the novel  Coronavirus (2019 nCoV)    Coronavirus HKU1 NOT DETECTED NOT DETECTED Final   Coronavirus NL63 NOT DETECTED NOT DETECTED Final   Coronavirus OC43 DETECTED (A) NOT DETECTED Final   Metapneumovirus NOT DETECTED NOT DETECTED Final   Rhinovirus / Enterovirus NOT DETECTED NOT DETECTED Final   Influenza A NOT DETECTED NOT DETECTED Final   Influenza B NOT DETECTED NOT DETECTED Final   Parainfluenza Virus 1 NOT DETECTED NOT DETECTED Final   Parainfluenza Virus 2 NOT DETECTED NOT DETECTED Final   Parainfluenza Virus 3 NOT DETECTED NOT DETECTED Final   Parainfluenza Virus 4 NOT DETECTED NOT DETECTED Final   Respiratory Syncytial Virus NOT DETECTED NOT DETECTED Final   Bordetella pertussis NOT DETECTED NOT DETECTED Final   Bordetella Parapertussis NOT DETECTED NOT DETECTED Final   Chlamydophila pneumoniae NOT DETECTED NOT DETECTED Final   Mycoplasma pneumoniae NOT DETECTED NOT DETECTED Final    Comment: Performed at [facility], [address], [place], [place] [address]  MRSA Next Gen by PCR, Nasal     Status: None   Collection Time: 07/21/23 11:47 AM   Specimen: Nasal Mucosa; Nasal Swab  Result Value Ref Range Status   MRSA by PCR Next Gen NOT DETECTED NOT DETECTED Final    Comment: (NOTE) The  GeneXpert MRSA Assay (FDA approved for NASAL specimens only), is one component of a comprehensive MRSA colonization surveillance program. It is not intended to diagnose MRSA infection nor to guide or monitor treatment for MRSA infections. Test performance is not FDA approved in patients less than 7 years old. Performed at [facility], [address], [place], [place] [address]      Radiology Studies: CT CHEST ABDOMEN PELVIS WO CONTRAST Result Date: 07/24/2023 CLINICAL DATA:  History of AIDS. Pancytopenia. Significant weight loss. History of pulmonary coccidiomycosis. EXAM: CT CHEST, ABDOMEN AND PELVIS WITHOUT CONTRAST TECHNIQUE: Multidetector CT imaging of the chest, abdomen and pelvis was performed following the standard protocol without IV contrast. RADIATION DOSE REDUCTION: This exam was performed according to the departmental dose-optimization program which includes automated exposure control, adjustment of the mA and/or kV according to patient size and/or use of iterative reconstruction technique. COMPARISON:  Abdomen pelvis CT 12/08/2021 FINDINGS: CT CHEST FINDINGS Cardiovascular: The heart size is normal. No substantial pericardial effusion. No thoracic aortic aneurysm. No substantial atherosclerosis of the thoracic aorta. Mediastinum/Nodes: No mediastinal lymphadenopathy. No evidence for gross hilar lymphadenopathy although assessment is limited by the lack of intravenous contrast on the current study. The esophagus has normal imaging features. There is no axillary lymphadenopathy. Lungs/Pleura: Paraseptal emphysema noted in the upper lungs  bilaterally dependent subsegmental atelectasis noted right lower lobe. No suspicious pulmonary nodule or mass. Subsegmental atelectasis noted left lung base. No pleural effusion. Musculoskeletal: No worrisome lytic or sclerotic osseous abnormality. CT ABDOMEN PELVIS FINDINGS Hepatobiliary: No suspicious focal abnormality in the liver on this study  without intravenous contrast. There is no evidence for gallstones, gallbladder wall thickening, or pericholecystic fluid. No intrahepatic or extrahepatic biliary dilation. Pancreas: No focal mass lesion. No dilatation of the main duct. No intraparenchymal cyst. No peripancreatic edema. Spleen: No splenomegaly. No suspicious focal mass lesion. Adrenals/Urinary Tract: No adrenal nodule or mass. Moderate to severe right hydroureteronephrosis is associated with moderate left hydroureteronephrosis. Both ureters are dilated down to the level of the UVJ. Bladder is nondistended with mild irregular circumferential bladder wall thickening. Stomach/Bowel: Stomach is unremarkable. No gastric wall thickening. No evidence of outlet obstruction. Duodenum is normally positioned as is the ligament of Treitz. Small bowel and colon not well evaluated due to breathing motion during image acquisition. No small bowel wall thickening. No small bowel dilatation. The appendix is not well visualized, but there is no edema or inflammation in the region of the cecal tip to suggest appendicitis. Moderate stool volume. Vascular/Lymphatic: No abdominal aortic aneurysm. No abdominal aortic atherosclerotic calcification. Upper normal lymph nodes are seen in the hepatoduodenal ligament. Mild para-aortic lymphadenopathy noted with 12 mm left para-aortic index node on 64/3. Another 12 mm short axis left para-aortic node evident on 73/3. 12 mm low left para-aortic node visible on 85/3. Borderline lymphadenopathy seen in the pelvic sidewall bilaterally. Reproductive: Areas of fluid density are again noted in the prostate gland. Inferior prostate demonstrates 3.6 x 1.5 cm fluid density structure, increased from 2.9 x 1.2 cm on the previous study and potentially involving the urethra. Other: No intraperitoneal free fluid. Musculoskeletal: No worrisome lytic or sclerotic osseous abnormality. IMPRESSION: 1. Moderate to severe right hydroureteronephrosis  with moderate left hydroureteronephrosis. Both ureters are dilated down to the level of the UVJ. No obstructing calculus evident. 2. Mild irregular circumferential bladder wall thickening. 3. Areas of fluid density in the prostate gland are again noted. Inferior prostate demonstrates 3.6 x 1.5 cm fluid density structure, increased from 2.9 x 1.2 cm on the previous study and potentially involving the urethra. As noted on the prior study, prostatic abscess would be a consideration. 4. Mild para-aortic lymphadenopathy with borderline lymphadenopathy in the pelvic sidewall bilaterally. Retroperitoneal lymphadenopathy is mildly progressive since the prior study. 5. No evidence for lymphadenopathy in the chest. 6. 6 mm pulmonary nodule identified at the right lung base previously has resolved. 7.  Emphysema (ICD10-J43.9). As noted previously, Electronically Signed   By: Kennith Center M.D.   On: 07/24/2023 07:05    Scheduled Meds:  cyanocobalamin  1,000 mcg Intramuscular Weekly   dapsone  100 mg Oral Daily   Darunavir-Cobicistat-Emtricitabine-Tenofovir Alafenamide  1 tablet Oral Q breakfast   enoxaparin (LOVENOX) injection  40 mg Subcutaneous Q24H   feeding supplement  237 mL Oral BID BM   ferrous sulfate  325 mg Oral BID WC   midodrine  5 mg Oral TID WC   multivitamin with minerals  1 tablet Oral Daily   pantoprazole  40 mg Oral Daily   senna-docusate  2 tablet Oral BID   sodium bicarbonate  1,300 mg Oral BID   tamsulosin  0.4 mg Oral QPC supper   thiamine  100 mg Oral Daily   Continuous Infusions:  ertapenem 1 g (07/24/23 0948)     LOS: 4 days  Time spent: 40 minutes  Carollee Herter, DO  [facility]  07/24/2023, 5:27 PM

## 2023-07-24 NOTE — Assessment & Plan Note (Addendum)
07-24-2023 present on admission. On admission, temp 101.7, RR 22, HR 104. UA shows significant hemoglobinuria, proteinuria, positive nitrite, moderate leuks, RBC >50, WBC >50, and many bacteria.  Sepsis has resolved with treatment of his E. Coli UTI.

## 2023-07-25 ENCOUNTER — Encounter (HOSPITAL_COMMUNITY): Payer: Self-pay | Admitting: Student

## 2023-07-25 ENCOUNTER — Ambulatory Visit: Payer: 59 | Admitting: Family

## 2023-07-25 DIAGNOSIS — N429 Disorder of prostate, unspecified: Secondary | ICD-10-CM

## 2023-07-25 DIAGNOSIS — Z5900 Homelessness unspecified: Secondary | ICD-10-CM | POA: Diagnosis not present

## 2023-07-25 DIAGNOSIS — B20 Human immunodeficiency virus [HIV] disease: Secondary | ICD-10-CM | POA: Diagnosis not present

## 2023-07-25 DIAGNOSIS — Z8619 Personal history of other infectious and parasitic diseases: Secondary | ICD-10-CM

## 2023-07-25 DIAGNOSIS — N390 Urinary tract infection, site not specified: Secondary | ICD-10-CM | POA: Diagnosis not present

## 2023-07-25 LAB — FUNGITELL BETA-D-GLUCAN
Fungitell Value:: <31.25 pg/mL
Result Name:: NEGATIVE

## 2023-07-25 LAB — GLUCOSE 6 PHOSPHATE DEHYDROGENASE
G6PDH: 7.4 U/g{Hb} (ref 3.8–14.2)
Hemoglobin: 9.5 g/dL — ABNORMAL LOW (ref 13.0–17.7)

## 2023-07-25 LAB — CULTURE, BLOOD (ROUTINE X 2): Culture: NO GROWTH

## 2023-07-25 NOTE — Assessment & Plan Note (Addendum)
 07-25-2023 pt with prior hx of syphilis.  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  His titer has decreased with prior treatment. RPR titer drop from 1:128 to 1:16 reflects adequate response to treatment back in March 2024.

## 2023-07-25 NOTE — Progress Notes (Signed)
 Brief ID Note:    Noted prostate MRI and recommendations from Dr. 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   Suspect that in light of this he will need longer than 2 weeks of antibiotics for chronic prostate abscess. Will continue with IV ertapenem  for now and send out e coli for further testing to see if omadacycline  is an option. Based on review of case reports this may be a reliable option in discussion with ID pharmacy team.   This will take 2 weeks or possibly more. For the time being he will need to remain inpatient for IV abx given homeless state.   FU with [facility] will be challenging for him I anticipate  Agree his RPR titer drop from 1:128 to 1:16 reflects adequate response to treatment back in March 2024. Will continue to follow again outpatient in 3 months or sooner if symptoms emerge to suggest concern for new infection.   We will continue to follow up with Neko intermittently while he is inpatient.   Will go ahead and get his HIV follow up appt rescheduled with Cathlyn in 6 weeks, Feb 17th @ 3:00 pm. Will need to arrange transportation.     Corean Fireman, MSN, NP-C [facility] for Infectious Disease [facility]  [email or web address] Pager: [phone] Office: [phone] [facility] Main Line: [phone] *Secure Chat Communication Welcome

## 2023-07-25 NOTE — Plan of Care (Signed)
  Problem: Activity: Goal: Risk for activity intolerance will decrease Outcome: Progressing   Problem: Elimination: Goal: Will not experience complications related to bowel motility Outcome: Progressing Goal: Will not experience complications related to urinary retention Outcome: Progressing   Problem: Pain Management: Goal: General experience of comfort will improve Outcome: Progressing   Problem: Safety: Goal: Ability to remain free from injury will improve Outcome: Progressing   Problem: Skin Integrity: Goal: Risk for impaired skin integrity will decrease Outcome: Progressing

## 2023-07-25 NOTE — Assessment & Plan Note (Addendum)
 07-25-2023 pt had prostate MRI per urology. 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  Per urology, this is a complex, finding and may require management at a tertiary care center with possible perineal excision of what is effectively a bladder diverticulum into the prostate 07-26-2023 he will need referral to tertiary care center with urologic surgery service that can manage this complex prostate/bladder anatomy after he has been treated for his ESBL E. Coli infection. 07-27-2023 urology to arrange outpatient referral to [facility] or tertiary care center. 07-28-2023 urology planning on outpatient referral to [facility]/tertiary care center. Pt is NOT going to be transferred during this hospitalization. 07-29-2023 confirmed with urology that transfer during this admission to tertiary care center is not possible. Urology service finding out if [facility] will even agree to see him as outpatient. 07-31-2023 will need outpatient referral to tertiary care center for urologic consultation.  08-02-2023 have reached out to urology to see if [facility] urologist will see this patient in clinic.

## 2023-07-25 NOTE — Progress Notes (Signed)
 PROGRESS NOTE    [name]  FMW:[id number] DOB: [birth year] DOA: 07/20/2023 PCP: No primary care provider on file.  Subjective: Pt seen and examined. Have consulted with urology to evaluate pt for his hydroureter, bladder and prostate abnormality. Pt underwent prostate MRI yesterday.    Hospital Course: HPI: Jacob Gutierrez is a 37 y.o. male with medical history significant of HIV/AIDS, medication noncompliance, remote substance abuse, pulmonary coccidiomycosis, syphilis, gonorrhea, monkeypox, ESBL E. coli UTI and HTN who presents to the ED for evaluation of dysuria, fevers and chills. Patient reports he is followed by the [facility] here in [place] but he moved to [place]  and has been out of his HIV medications since June/July. He has had a history of UTIs and prostatitis and was recently admitted for sepsis due to UTI last month in [place] . He came to [facility] for the holidays and has continued to have urinary symptoms including difficulty urinating, pain with urination and dark urine. Over the last 3-4 days, he has had intermittent fevers and sweats. He also endorsed mild dyspnea on exertion and fatigue but denies any cough, headaches, dizziness, penile discharge, abdominal pain, shortness of breath or chest pain. He is sexually active and his last sexual encounter was on unprotected sexual intercourse with a male about a month ago. He reports prior history of substance abuse but has been abstinent for years.   Significant Events: Admitted 07/20/2023 for UTI   Significant Labs: sodium 129, K+ 3.9, bicarb 19, creatinine 1.76, albumin  2.7, WBC 1.9, Hgb 10.7, platelet 130, PT/INR 15.0/1.2, glucose 86, lactic acid 0.6, UA shows significant hemoglobinuria, proteinuria, positive nitrite, moderate leuks, RBC >50, WBC >50, and many bacteria  Urine Cx with ESBL E. coli  Significant Imaging Studies: Chest x-ray with no active cardiopulmonary disease   Antibiotic Therapy: Anti-infectives  (From admission, onward)    Start     Dose/Rate Route Frequency Ordered Stop   07/23/23 1700  Darunavir -Cobicistat-Emtricitabine -Tenofovir  Alafenamide (SYMTUZA ) 800-150-200-10 MG TABS 1 tablet        1 tablet Oral Daily with breakfast 07/23/23 1538     07/23/23 1630  dapsone  tablet 100 mg        100 mg Oral Daily 07/23/23 1538     07/21/23 1200  ertapenem  (INVANZ ) 1 g in sodium chloride  0.9 % 100 mL IVPB        1 g 200 mL/hr over 30 Minutes Intravenous Every 24 hours 07/21/23 1101     07/21/23 1000  metroNIDAZOLE  (FLAGYL ) IVPB 500 mg  Status:  Discontinued        500 mg 100 mL/hr over 60 Minutes Intravenous Every 12 hours 07/20/23 2202 07/21/23 1101   07/21/23 0800  linezolid  (ZYVOX ) IVPB 600 mg  Status:  Discontinued        600 mg 300 mL/hr over 60 Minutes Intravenous Every 12 hours 07/20/23 2202 07/23/23 1415   07/20/23 2300  ceFEPIme  (MAXIPIME ) 2 g in sodium chloride  0.9 % 100 mL IVPB  Status:  Discontinued        2 g 200 mL/hr over 30 Minutes Intravenous Every 8 hours 07/20/23 2205 07/21/23 1101   07/20/23 1445  linezolid  (ZYVOX ) IVPB 600 mg        600 mg 300 mL/hr over 60 Minutes Intravenous  Once 07/20/23 1437 07/20/23 1829   07/20/23 1430  aztreonam (AZACTAM) 2 g in sodium chloride  0.9 % 100 mL IVPB  Status:  Discontinued        2 g 200 mL/hr over 30  Minutes Intravenous  Once 07/20/23 1417 07/20/23 1419   07/20/23 1430  metroNIDAZOLE  (FLAGYL ) IVPB 500 mg        500 mg 100 mL/hr over 60 Minutes Intravenous  Once 07/20/23 1417 07/20/23 1727   07/20/23 1430  ceFEPIme  (MAXIPIME ) 2 g in sodium chloride  0.9 % 100 mL IVPB        2 g 200 mL/hr over 30 Minutes Intravenous  Once 07/20/23 1419 07/20/23 1557       Procedures:   Consultants:     Assessment and Plan: * UTI due to extended-spectrum beta lactamase (ESBL) producing Escherichia coli Since admission, in the setting of immunocompromised state due to Hiv/aids and non compliance to meds.  - continue with broad spectrum  antibiotics. Currently on Ertapenem  , Linezolid  discontinued.  - urine cultures growing  >100K  ESBL. Sensitive to ertapenem .   07-24-2023 remains on IV Invanz . Day #4. Awaiting ID f/u note.  07-25-2023 ID wants to treat with 14 days of IV Invanz . 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  Per urology, this is a complex, finding and may require management at a tertiary care center with possible perineal excision of what is effectively a bladder diverticulum into the prostate  Sepsis due to Escherichia coli (E. coli) (HCC) 07-24-2023 present on admission. On admission, temp 101.7, RR 22, HR 104. UA shows significant hemoglobinuria, proteinuria, positive nitrite, moderate leuks, RBC >50, WBC >50, and many bacteria.  Sepsis has resolved with treatment of his E. Coli UTI.  Febrile illness 07-24-2023 related to his untreated HIV, coronavirus infection and ESBL e. Coli infection. 07-25-2023 he is afebrile now.  Symptomatic HIV infection (HCC) 07-24-2023 seen by ID. Started on Sumtuza, dapsone  for PJP prophylaxis. 07-25-2023 continue with HIV meds and dapsone  for PJP prophylaxis.  Neutropenia with fever (HCC) 07-25-2023 related to his untreated HIV.  AKI (acute kidney injury) (HCC) 07-24-2023 baseline SCR 1.3 from [facility] where labs.  AKI has resolved with IVF.  Coronavirus infection 07-25-2023 Not covid-19.  Continue with supportive care. On RA.  Pancytopenia (HCC) 07-25-2023 related to his untreated HIV.  Homelessness 07-24-2023 pt states his mother lives in [place]. TOC consulted for assistance in housing. Pt just returned from [place]  where he was staying with family friend.  07-25-2023 TOC is engaged and pt may ultimately need to go to homeless shelter at discharge.  History of syphilis 07-25-2023 pt with prior hx of syphilis.   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  His titer has decreased with prior treatment.     Malnutrition of moderate degree 07-24-2023 see RD documentation from today. Moderate Malnutrition related to chronic illness as evidenced by mild fat depletion, moderate muscle depletion.    DVT prophylaxis: enoxaparin  (LOVENOX ) injection 40 mg Start: 07/20/23 2200    Code Status: Full Code Family Communication: no family at bedside Disposition Plan: unknown Reason for continuing need for hospitalization: remains on IV Abx.  Objective: Vitals:   07/24/23 0733 07/24/23 1536 07/24/23 1955 07/25/23 0817  BP: 98/64 (!) 102/58 (!) 104/56 98/63  Pulse: 77 94 98 79  Resp: 16 16  17   Temp: 98 F (36.7 C) 98.3 F (36.8 C) 98.2 F (36.8 C) 98 F (36.7 C)  TempSrc: Oral Oral Oral Oral  SpO2: 100% 100% 98% 98%  Weight:      Height:  Intake/Output Summary (Last 24 hours) at 07/25/2023 1145 Last data filed at 07/24/2023 2015 Gross per 24 hour  Intake 480 ml  Output 200 ml  Net 280 ml   Filed Weights   07/21/23 0900 07/21/23 1528  Weight: 84.6 kg 83.5 kg    Examination:  Physical Exam Vitals and nursing note reviewed.  Constitutional:      General: He is not in acute distress.    Appearance: He is not toxic-appearing.  HENT:     Head: Normocephalic and atraumatic.     Nose: Nose normal.  Eyes:     General: No scleral icterus. Cardiovascular:     Rate and Rhythm: Normal rate and regular rhythm.  Pulmonary:     Effort: Pulmonary effort is normal.     Breath sounds: Normal breath sounds.  Abdominal:     General: Bowel sounds are normal.     Palpations: Abdomen is soft.  Musculoskeletal:     Right lower leg: No edema.     Left lower leg: No edema.  Skin:    General: Skin is warm and dry.     Capillary Refill: Capillary refill takes less than 2 seconds.  Neurological:     Mental Status: He is alert and oriented to person, place, and time.    Data Reviewed:  I have personally reviewed following labs and imaging studies  CBC: Recent Labs  Lab 07/20/23 1310 07/21/23 0750 07/22/23 0610  WBC 1.9* 1.8* 1.4*  NEUTROABS 0.8*  --  1.2*  HGB 10.7* 9.7* 8.5*  HCT 31.8* 29.1* 25.3*  MCV 90.6 90.7 90.7  PLT 130* 120* 118*   Basic Metabolic Panel: Recent Labs  Lab 07/21/23 0750 07/21/23 1159 07/22/23 0610 07/23/23 0554 07/24/23 0614  NA 128* 130* 131* 135 137  K 3.7 3.9 3.7 4.0 3.7  CL 102 103 104 108 108  CO2 18* 19* 22 20* 22  GLUCOSE 114* 140* 88 103* 99  BUN 13 13 10 13 11   CREATININE 1.57* 1.60* 1.49* 1.37* 1.29*  CALCIUM  7.4* 7.6* 7.6* 8.0* 8.0*  MG  --   --  1.8  --   --    GFR: Estimated Creatinine Clearance: 86.9 mL/min (A) (by C-G formula based on SCr of 1.29 mg/dL (H)). Liver Function Tests: Recent Labs  Lab 07/20/23 1310 07/21/23 1159 07/22/23 0610 07/23/23 0554 07/24/23 0614  AST 20 19 16 28  182*  ALT 16 11 11 14  82*  ALKPHOS 48 40 39 39 50  BILITOT 1.0 0.5 0.5 0.5 0.3  PROT 9.8* 7.7 7.2 7.9 7.7  ALBUMIN  2.7* 1.9* 1.9* 2.0* 2.1*   Coagulation Profile: Recent Labs  Lab 07/20/23 1310  INR 1.2   Thyroid Function Tests: Recent Labs    07/23/23 0554  TSH 2.810   Sepsis Labs: Recent Labs  Lab 07/20/23 1322 07/20/23 1506  LATICACIDVEN 0.6 1.0    Recent Results (from the past 240 hours)  Culture, blood (Routine x 2)     Status: None   Collection Time: 07/20/23  1:10 PM   Specimen: BLOOD LEFT HAND  Result Value Ref Range Status   Specimen Description BLOOD LEFT HAND  Final   Special Requests   Final    BOTTLES DRAWN AEROBIC AND ANAEROBIC Blood Culture results may not be optimal due to an inadequate volume of blood received in culture bottles   Culture   Final    NO GROWTH 5 DAYS Performed at [facility], [address] [place],  [place] [address]    Report Status 07/25/2023 FINAL  Final  Urine Culture     Status: Abnormal   Collection Time: 07/20/23  3:13 PM   Specimen: Urine, Clean Catch   Result Value Ref Range Status   Specimen Description URINE, CLEAN CATCH  Final   Special Requests   Final    NONE Reflexed from [id number] Performed at [facility], [address] [place], [place] [address]    Culture (A)  Final    >=100,000 COLONIES/mL ESCHERICHIA COLI CORRECTED ON 12/30 AT 9188: PREVIOUSLY REPORTED AS >=100,000 COLONIES/mL GRAM NEGATIVE RODS Confirmed Extended Spectrum Beta-Lactamase Producer (ESBL).  In bloodstream infections from ESBL organisms, carbapenems are preferred over piperacillin /tazobactam. They are shown to have a lower risk of mortality.    Report Status 07/24/2023 FINAL  Final   Organism ID, Bacteria ESCHERICHIA COLI (A)  Final      Susceptibility   Escherichia coli - MIC*    AMPICILLIN >=32 RESISTANT Resistant     CEFAZOLIN  >=64 RESISTANT Resistant     CEFEPIME  16 RESISTANT Resistant     CEFTRIAXONE  >=64 RESISTANT Resistant     CIPROFLOXACIN  >=4 RESISTANT Resistant     GENTAMICIN  <=1 SENSITIVE Sensitive     IMIPENEM <=0.25 SENSITIVE Sensitive     NITROFURANTOIN  <=16 SENSITIVE Sensitive     TRIMETH /SULFA  >=320 RESISTANT Resistant     AMPICILLIN/SULBACTAM 4 SENSITIVE Sensitive     PIP/TAZO <=4 SENSITIVE Sensitive ug/mL    * >=100,000 COLONIES/mL ESCHERICHIA COLI CORRECTED ON 12/30 AT 9188: PREVIOUSLY REPORTED AS >=100,000 COLONIES/mL GRAM NEGATIVE RODS  Respiratory (~20 pathogens) panel by PCR     Status: Abnormal   Collection Time: 07/20/23  6:06 PM   Specimen: Nasopharyngeal Swab; Respiratory  Result Value Ref Range Status   Adenovirus NOT DETECTED NOT DETECTED Final   Coronavirus 229E NOT DETECTED NOT DETECTED Final    Comment: (NOTE) The Coronavirus on the Respiratory Panel, DOES NOT test for the novel  Coronavirus (2019 nCoV)    Coronavirus HKU1 NOT DETECTED NOT DETECTED Final   Coronavirus NL63 NOT DETECTED NOT DETECTED Final   Coronavirus OC43 DETECTED (A) NOT DETECTED Final   Metapneumovirus NOT DETECTED NOT DETECTED Final    Rhinovirus / Enterovirus NOT DETECTED NOT DETECTED Final   Influenza A NOT DETECTED NOT DETECTED Final   Influenza B NOT DETECTED NOT DETECTED Final   Parainfluenza Virus 1 NOT DETECTED NOT DETECTED Final   Parainfluenza Virus 2 NOT DETECTED NOT DETECTED Final   Parainfluenza Virus 3 NOT DETECTED NOT DETECTED Final   Parainfluenza Virus 4 NOT DETECTED NOT DETECTED Final   Respiratory Syncytial Virus NOT DETECTED NOT DETECTED Final   Bordetella pertussis NOT DETECTED NOT DETECTED Final   Bordetella Parapertussis NOT DETECTED NOT DETECTED Final   Chlamydophila pneumoniae NOT DETECTED NOT DETECTED Final   Mycoplasma pneumoniae NOT DETECTED NOT DETECTED Final    Comment: Performed at [facility], [address], [place], [place] [address]  MRSA Next Gen by PCR, Nasal     Status: None   Collection Time: 07/21/23 11:47 AM   Specimen: Nasal Mucosa; Nasal Swab  Result Value Ref Range Status   MRSA by PCR Next Gen NOT DETECTED NOT DETECTED Final    Comment: (NOTE) The GeneXpert MRSA Assay (FDA approved for NASAL specimens only), is one component of a comprehensive MRSA colonization surveillance program. It is not intended to diagnose MRSA infection nor to guide or monitor treatment for MRSA infections. Test performance  is not FDA approved in patients less than 63 years old. Performed at [facility], [address] [place], [place] [address]      Radiology Studies: MR PROSTATE W WO CONTRAST Result Date: 07/25/2023 CLINICAL DATA:  Prostatic abscess EXAM: MR PROSTATE WITHOUT AND WITH CONTRAST TECHNIQUE: Multiplanar multisequence MRI images were obtained of the pelvis centered about the prostate. Pre and post contrast images were obtained. CONTRAST:  8mL GADAVIST  GADOBUTROL  1 MMOL/ML IV SOLN COMPARISON:  None Available. FINDINGS: Prostate: There is an hourglass shaped/barbell shaped fluid collection which involves the bladder, extends through the prostate gland, and expands inferior  to the prostate gland and base of the penis. This is well seen on coronal image 140/MR series 601. Collection is also seen on sagittal image 138/600. The bladder is thick-walled with bilateral hydroureter. Communicating fluid collection inferior to the prostate gland measures 3.8 cm in diameter. No enhancement along the course of the fluid collection within the prostate gland or inferiorly to suggest acute prostatitis or abscess. IMPRESSION: Communicating fluid collection extending from the bladder through the prostate gland and with re-expansion inferior to the pelvic floor. No enhancement to suggest abscess. Electronically Signed   By: Jackquline Boxer M.D.   On: 07/25/2023 09:42   CT CHEST ABDOMEN PELVIS WO CONTRAST Result Date: 07/24/2023 CLINICAL DATA:  History of AIDS. Pancytopenia. Significant weight loss. History of pulmonary coccidiomycosis. EXAM: CT CHEST, ABDOMEN AND PELVIS WITHOUT CONTRAST TECHNIQUE: Multidetector CT imaging of the chest, abdomen and pelvis was performed following the standard protocol without IV contrast. RADIATION DOSE REDUCTION: This exam was performed according to the departmental dose-optimization program which includes automated exposure control, adjustment of the mA and/or kV according to patient size and/or use of iterative reconstruction technique. COMPARISON:  Abdomen pelvis CT 12/08/2021 FINDINGS: CT CHEST FINDINGS Cardiovascular: The heart size is normal. No substantial pericardial effusion. No thoracic aortic aneurysm. No substantial atherosclerosis of the thoracic aorta. Mediastinum/Nodes: No mediastinal lymphadenopathy. No evidence for gross hilar lymphadenopathy although assessment is limited by the lack of intravenous contrast on the current study. The esophagus has normal imaging features. There is no axillary lymphadenopathy. Lungs/Pleura: Paraseptal emphysema noted in the upper lungs bilaterally dependent subsegmental atelectasis noted right lower lobe. No  suspicious pulmonary nodule or mass. Subsegmental atelectasis noted left lung base. No pleural effusion. Musculoskeletal: No worrisome lytic or sclerotic osseous abnormality. CT ABDOMEN PELVIS FINDINGS Hepatobiliary: No suspicious focal abnormality in the liver on this study without intravenous contrast. There is no evidence for gallstones, gallbladder wall thickening, or pericholecystic fluid. No intrahepatic or extrahepatic biliary dilation. Pancreas: No focal mass lesion. No dilatation of the main duct. No intraparenchymal cyst. No peripancreatic edema. Spleen: No splenomegaly. No suspicious focal mass lesion. Adrenals/Urinary Tract: No adrenal nodule or mass. Moderate to severe right hydroureteronephrosis is associated with moderate left hydroureteronephrosis. Both ureters are dilated down to the level of the UVJ. Bladder is nondistended with mild irregular circumferential bladder wall thickening. Stomach/Bowel: Stomach is unremarkable. No gastric wall thickening. No evidence of outlet obstruction. Duodenum is normally positioned as is the ligament of Treitz. Small bowel and colon not well evaluated due to breathing motion during image acquisition. No small bowel wall thickening. No small bowel dilatation. The appendix is not well visualized, but there is no edema or inflammation in the region of the cecal tip to suggest appendicitis. Moderate stool volume. Vascular/Lymphatic: No abdominal aortic aneurysm. No abdominal aortic atherosclerotic calcification. Upper normal lymph nodes are seen in the hepatoduodenal ligament. Mild  para-aortic lymphadenopathy noted with 12 mm left para-aortic index node on 64/3. Another 12 mm short axis left para-aortic node evident on 73/3. 12 mm low left para-aortic node visible on 85/3. Borderline lymphadenopathy seen in the pelvic sidewall bilaterally. Reproductive: Areas of fluid density are again noted in the prostate gland. Inferior prostate demonstrates 3.6 x 1.5 cm fluid  density structure, increased from 2.9 x 1.2 cm on the previous study and potentially involving the urethra. Other: No intraperitoneal free fluid. Musculoskeletal: No worrisome lytic or sclerotic osseous abnormality. IMPRESSION: 1. Moderate to severe right hydroureteronephrosis with moderate left hydroureteronephrosis. Both ureters are dilated down to the level of the UVJ. No obstructing calculus evident. 2. Mild irregular circumferential bladder wall thickening. 3. Areas of fluid density in the prostate gland are again noted. Inferior prostate demonstrates 3.6 x 1.5 cm fluid density structure, increased from 2.9 x 1.2 cm on the previous study and potentially involving the urethra. As noted on the prior study, prostatic abscess would be a consideration. 4. Mild para-aortic lymphadenopathy with borderline lymphadenopathy in the pelvic sidewall bilaterally. Retroperitoneal lymphadenopathy is mildly progressive since the prior study. 5. No evidence for lymphadenopathy in the chest. 6. 6 mm pulmonary nodule identified at the right lung base previously has resolved. 7.  Emphysema (ICD10-J43.9). As noted previously, Electronically Signed   By: Camellia Candle M.D.   On: 07/24/2023 07:05    Scheduled Meds:  cyanocobalamin   1,000 mcg Intramuscular Weekly   dapsone   100 mg Oral Daily   Darunavir -Cobicistat-Emtricitabine -Tenofovir  Alafenamide  1 tablet Oral Q breakfast   enoxaparin  (LOVENOX ) injection  40 mg Subcutaneous Q24H   feeding supplement  237 mL Oral BID BM   ferrous sulfate   325 mg Oral BID WC   midodrine   5 mg Oral TID WC   multivitamin with minerals  1 tablet Oral Daily   pantoprazole   40 mg Oral Daily   senna-docusate  2 tablet Oral BID   sodium bicarbonate   1,300 mg Oral BID   tamsulosin   0.4 mg Oral QPC supper   thiamine   100 mg Oral Daily   Continuous Infusions:  ertapenem  1 g (07/25/23 1002)     LOS: 5 days   Time spent: 40 minutes  Camellia Door, DO  [facility]  07/25/2023,  11:45 AM

## 2023-07-25 NOTE — Progress Notes (Signed)
 Patient ID: Jacob Gutierrez, male   DOB: August 25, 1985, 37 y.o.   MRN: [id number]  Dixon had the MRI of the prostate and it demonstrates a fluid collection that communicates with the bladder and extends to the prostatic apex where it is broadest at the base of the corpora spongiosum and it appears to wrap around the posterior prostatic urethra at the level of the Veru montanum.    There is also some bladder wall thickening with the dilated ureters to the level of the bladder bilaterally.    Imp: Prostatic fluid collection that could be compressing the urethral with voiding as the bladder pressure would transmit to the fluid collection compressing the urethra.  This could be contributing to the bilateral hydro.     Rec:  this is a complex, finding and may require management at a tertiary care center with possible perineal excision of what is effectively a bladder diverticulum into the prostate.

## 2023-07-26 DIAGNOSIS — N390 Urinary tract infection, site not specified: Secondary | ICD-10-CM | POA: Diagnosis not present

## 2023-07-26 DIAGNOSIS — B20 Human immunodeficiency virus [HIV] disease: Secondary | ICD-10-CM | POA: Diagnosis not present

## 2023-07-26 DIAGNOSIS — E440 Moderate protein-calorie malnutrition: Secondary | ICD-10-CM

## 2023-07-26 DIAGNOSIS — B342 Coronavirus infection, unspecified: Secondary | ICD-10-CM | POA: Diagnosis not present

## 2023-07-26 DIAGNOSIS — N429 Disorder of prostate, unspecified: Secondary | ICD-10-CM | POA: Diagnosis not present

## 2023-07-26 NOTE — Progress Notes (Signed)
 PROGRESS NOTE    Jacob Gutierrez  FMW:[id number] DOB: October 23, 1985 DOA: 07/20/2023 PCP: No primary care provider on file.  Subjective: Jacob Gutierrez seen and examined. Stable. Afebrile. No complaints. Jacob Gutierrez states he has walked around his room. He remains in isolation due to corona virus infection and ESBL E. Coli infection.   Hospital Course: HPI: Jacob Gutierrez is a 38 y.o. male with medical history significant of HIV/AIDS, medication noncompliance, remote substance abuse, pulmonary coccidiomycosis, syphilis, gonorrhea, monkeypox, ESBL E. coli UTI and HTN who presents to the ED for evaluation of dysuria, fevers and chills. Patient reports he is followed by the [facility] here in [place] but he moved to [place]  and has been out of his HIV medications since June/July. He has had a history of UTIs and prostatitis and was recently admitted for sepsis due to UTI last month in [place] . He came to [facility] for the holidays and has continued to have urinary symptoms including difficulty urinating, pain with urination and dark urine. Over the last 3-4 days, he has had intermittent fevers and sweats. He also endorsed mild dyspnea on exertion and fatigue but denies any cough, headaches, dizziness, penile discharge, abdominal pain, shortness of breath or chest pain. He is sexually active and his last sexual encounter was on unprotected sexual intercourse with a male about a month ago. He reports prior history of substance abuse but has been abstinent for years.   Significant Events: Admitted 07/20/2023 for UTI   Significant Labs: sodium 129, K+ 3.9, bicarb 19, creatinine 1.76, albumin  2.7, WBC 1.9, Hgb 10.7, platelet 130, Jacob Gutierrez/INR 15.0/1.2, glucose 86, lactic acid 0.6, UA shows significant hemoglobinuria, proteinuria, positive nitrite, moderate leuks, RBC >50, WBC >50, and many bacteria  Urine Cx with ESBL E. coli  Significant Imaging Studies: Chest x-ray with no active cardiopulmonary disease   Antibiotic  Therapy: Anti-infectives (From admission, onward)    Start     Dose/Rate Route Frequency Ordered Stop   07/23/23 1700  Darunavir -Cobicistat-Emtricitabine -Tenofovir  Alafenamide (SYMTUZA ) 800-150-200-10 MG TABS 1 tablet        1 tablet Oral Daily with breakfast 07/23/23 1538     07/23/23 1630  dapsone  tablet 100 mg        100 mg Oral Daily 07/23/23 1538     07/21/23 1200  ertapenem  (INVANZ ) 1 g in sodium chloride  0.9 % 100 mL IVPB        1 g 200 mL/hr over 30 Minutes Intravenous Every 24 hours 07/21/23 1101     07/21/23 1000  metroNIDAZOLE  (FLAGYL ) IVPB 500 mg  Status:  Discontinued        500 mg 100 mL/hr over 60 Minutes Intravenous Every 12 hours 07/20/23 2202 07/21/23 1101   07/21/23 0800  linezolid  (ZYVOX ) IVPB 600 mg  Status:  Discontinued        600 mg 300 mL/hr over 60 Minutes Intravenous Every 12 hours 07/20/23 2202 07/23/23 1415   07/20/23 2300  ceFEPIme  (MAXIPIME ) 2 g in sodium chloride  0.9 % 100 mL IVPB  Status:  Discontinued        2 g 200 mL/hr over 30 Minutes Intravenous Every 8 hours 07/20/23 2205 07/21/23 1101   07/20/23 1445  linezolid  (ZYVOX ) IVPB 600 mg        600 mg 300 mL/hr over 60 Minutes Intravenous  Once 07/20/23 1437 07/20/23 1829   07/20/23 1430  aztreonam (AZACTAM) 2 g in sodium chloride  0.9 % 100 mL IVPB  Status:  Discontinued  2 g 200 mL/hr over 30 Minutes Intravenous  Once 07/20/23 1417 07/20/23 1419   07/20/23 1430  metroNIDAZOLE  (FLAGYL ) IVPB 500 mg        500 mg 100 mL/hr over 60 Minutes Intravenous  Once 07/20/23 1417 07/20/23 1727   07/20/23 1430  ceFEPIme  (MAXIPIME ) 2 g in sodium chloride  0.9 % 100 mL IVPB        2 g 200 mL/hr over 30 Minutes Intravenous  Once 07/20/23 1419 07/20/23 1557       Procedures:   Consultants:     Assessment and Plan: * UTI due to extended-spectrum beta lactamase (ESBL) producing Escherichia coli Since admission, in the setting of immunocompromised state due to Hiv/aids and non compliance to meds.  -  continue with broad spectrum antibiotics. Currently on Ertapenem  , Linezolid  discontinued.  - urine cultures growing  >100K  ESBL. Sensitive to ertapenem .  07-24-2023 remains on IV Invanz . Day #4. Awaiting ID f/u note. 07-25-2023 ID wants to treat with 14 days of IV Invanz . Today is Day #5 07-26-2023 per ID, unknown end date of IV Invanz . Continue IV Invanz  per ID service.    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  Per urology, this is a complex, finding and may require management at a tertiary care center with possible perineal excision of what is effectively a bladder diverticulum into the prostate  07-26-2023 he will need referral to tertiary care center with urologic surgery service that can manage this complex prostate/bladder anatomy after he has been treated for his ESBL E. Coli infection.  Sepsis due to Escherichia coli (E. coli) (HCC) 07-24-2023 present on admission. On admission, temp 101.7, RR 22, HR 104. UA shows significant hemoglobinuria, proteinuria, positive nitrite, moderate leuks, RBC >50, WBC >50, and many bacteria.  Sepsis has resolved with treatment of his E. Coli UTI.  Febrile illness 07-24-2023 related to his untreated HIV, coronavirus infection and ESBL e. Coli infection. 07-25-2023 he is afebrile now.  Symptomatic HIV infection (HCC) 07-24-2023 seen by ID. Started on Sumtuza, dapsone  for PJP prophylaxis. 07-25-2023 continue with HIV meds and dapsone  for PJP prophylaxis.  07-26-2023 stable on Symtuza  and dapsone   Neutropenia with fever (HCC) 07-25-2023 related to his untreated HIV. 07-25-2022 fever has resolved.  AKI (acute kidney injury) (HCC) 07-24-2023 baseline SCR 1.3 from [facility] where labs.  AKI has resolved with IVF.  Coronavirus infection 07-25-2023 Not covid-19.  Continue with supportive care. On  RA.  07-26-2023 awaiting ID recs on how long he needs to remain in droplet isolation.  Pancytopenia (HCC) 07-25-2023 related to his untreated HIV.  Homelessness 07-24-2023 Jacob Gutierrez states his mother lives in [place]. TOC consulted for assistance in housing. Jacob Gutierrez just returned from [place]  where he was staying with family friend.  07-25-2023 TOC is engaged and Jacob Gutierrez may ultimately need to go to homeless shelter at discharge.  History of syphilis 07-25-2023 Jacob Gutierrez with prior hx of syphilis.  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  His titer has decreased with prior treatment. RPR titer drop from 1:128 to 1:16 reflects adequate response to treatment back in March 2024.    Malnutrition of moderate degree 07-24-2023 see RD documentation from today. Moderate Malnutrition related to chronic illness as evidenced by mild fat depletion, moderate muscle depletion.    DVT prophylaxis: enoxaparin  (LOVENOX ) injection 40 mg Start: 07/20/23 2200    Code Status: Full Code  Family Communication: no family at bedside. Jacob Gutierrez is decisional. Disposition Plan: unknown Reason for continuing need for hospitalization: remains on IV Invanz . Not a OPAT candidate due to homeless situation.  Objective: Vitals:   07/25/23 1704 07/25/23 2031 07/26/23 0440 07/26/23 0758  BP: (!) 94/59 97/69 97/64  102/66  Pulse: 75 78 73 77  Resp: 16 17 16 16   Temp: 98.1 F (36.7 C) 98.4 F (36.9 C) 97.7 F (36.5 C) 98.2 F (36.8 C)  TempSrc: Oral Oral Oral Oral  SpO2: 99% 98% 99% 99%  Weight:      Height:        Intake/Output Summary (Last 24 hours) at 07/26/2023 0934 Last data filed at 07/25/2023 2148 Gross per 24 hour  Intake 580 ml  Output 150 ml  Net 430 ml   Filed Weights   07/21/23 0900 07/21/23 1528  Weight: 84.6 kg 83.5 kg    Examination:  Physical Exam Vitals and nursing note reviewed.  Constitutional:      General: He is not in acute distress.    Appearance: He is not toxic-appearing.     Comments:  Chronically ill appearing  HENT:     Head: Normocephalic and atraumatic.     Nose: Nose normal.  Cardiovascular:     Rate and Rhythm: Normal rate and regular rhythm.  Pulmonary:     Effort: Pulmonary effort is normal.     Breath sounds: Normal breath sounds.  Abdominal:     General: Abdomen is flat. Bowel sounds are normal.  Musculoskeletal:     Right lower leg: No edema.     Left lower leg: No edema.  Skin:    General: Skin is warm and dry.     Capillary Refill: Capillary refill takes less than 2 seconds.  Neurological:     Mental Status: He is alert and oriented to person, place, and time.     Data Reviewed: I have personally reviewed following labs and imaging studies  CBC: Recent Labs  Lab 07/20/23 1310 07/21/23 0750 07/22/23 0610 07/24/23 0614  WBC 1.9* 1.8* 1.4*  --   NEUTROABS 0.8*  --  1.2*  --   HGB 10.7* 9.7* 8.5* 9.5*  HCT 31.8* 29.1* 25.3*  --   MCV 90.6 90.7 90.7  --   PLT 130* 120* 118*  --    Basic Metabolic Panel: Recent Labs  Lab 07/21/23 0750 07/21/23 1159 07/22/23 0610 07/23/23 0554 07/24/23 0614  NA 128* 130* 131* 135 137  K 3.7 3.9 3.7 4.0 3.7  CL 102 103 104 108 108  CO2 18* 19* 22 20* 22  GLUCOSE 114* 140* 88 103* 99  BUN 13 13 10 13 11   CREATININE 1.57* 1.60* 1.49* 1.37* 1.29*  CALCIUM  7.4* 7.6* 7.6* 8.0* 8.0*  MG  --   --  1.8  --   --    GFR: Estimated Creatinine Clearance: 86.9 mL/min (A) (by C-G formula based on SCr of 1.29 mg/dL (H)). Liver Function Tests: Recent Labs  Lab 07/20/23 1310 07/21/23 1159 07/22/23 0610 07/23/23 0554 07/24/23 0614  AST 20 19 16 28  182*  ALT 16 11 11 14  82*  ALKPHOS 48 40 39 39 50  BILITOT 1.0 0.5 0.5 0.5 0.3  PROT 9.8* 7.7 7.2 7.9 7.7  ALBUMIN  2.7* 1.9* 1.9* 2.0* 2.1*   Coagulation Profile: Recent Labs  Lab 07/20/23 1310  INR 1.2   Sepsis Labs: Recent Labs  Lab 07/20/23 1322 07/20/23 1506  LATICACIDVEN 0.6 1.0  Recent Results (from the past 240 hours)  Culture, blood  (Routine x 2)     Status: None   Collection Time: 07/20/23  1:10 PM   Specimen: BLOOD LEFT HAND  Result Value Ref Range Status   Specimen Description BLOOD LEFT HAND  Final   Special Requests   Final    BOTTLES DRAWN AEROBIC AND ANAEROBIC Blood Culture results may not be optimal due to an inadequate volume of blood received in culture bottles   Culture   Final    NO GROWTH 5 DAYS Performed at [facility], [address], [place], [place] [address]    Report Status 07/25/2023 FINAL  Final  Urine Culture     Status: Abnormal   Collection Time: 07/20/23  3:13 PM   Specimen: Urine, Clean Catch  Result Value Ref Range Status   Specimen Description URINE, CLEAN CATCH  Final   Special Requests   Final    NONE Reflexed from [id number] Performed at [facility], [address] [place], [place] [address]    Culture (A)  Final    >=100,000 COLONIES/mL ESCHERICHIA COLI CORRECTED ON 12/30 AT 9188: PREVIOUSLY REPORTED AS >=100,000 COLONIES/mL GRAM NEGATIVE RODS Confirmed Extended Spectrum Beta-Lactamase Producer (ESBL).  In bloodstream infections from ESBL organisms, carbapenems are preferred over piperacillin /tazobactam. They are shown to have a lower risk of mortality.    Report Status 07/24/2023 FINAL  Final   Organism ID, Bacteria ESCHERICHIA COLI (A)  Final      Susceptibility   Escherichia coli - MIC*    AMPICILLIN >=32 RESISTANT Resistant     CEFAZOLIN  >=64 RESISTANT Resistant     CEFEPIME  16 RESISTANT Resistant     CEFTRIAXONE  >=64 RESISTANT Resistant     CIPROFLOXACIN  >=4 RESISTANT Resistant     GENTAMICIN  <=1 SENSITIVE Sensitive     IMIPENEM <=0.25 SENSITIVE Sensitive     NITROFURANTOIN  <=16 SENSITIVE Sensitive     TRIMETH /SULFA  >=320 RESISTANT Resistant     AMPICILLIN/SULBACTAM 4 SENSITIVE Sensitive     PIP/TAZO <=4 SENSITIVE Sensitive ug/mL    * >=100,000 COLONIES/mL ESCHERICHIA COLI CORRECTED ON 12/30 AT 9188: PREVIOUSLY REPORTED AS >=100,000 COLONIES/mL GRAM NEGATIVE  RODS  Respiratory (~20 pathogens) panel by PCR     Status: Abnormal   Collection Time: 07/20/23  6:06 PM   Specimen: Nasopharyngeal Swab; Respiratory  Result Value Ref Range Status   Adenovirus NOT DETECTED NOT DETECTED Final   Coronavirus 229E NOT DETECTED NOT DETECTED Final    Comment: (NOTE) The Coronavirus on the Respiratory Panel, DOES NOT test for the novel  Coronavirus (2019 nCoV)    Coronavirus HKU1 NOT DETECTED NOT DETECTED Final   Coronavirus NL63 NOT DETECTED NOT DETECTED Final   Coronavirus OC43 DETECTED (A) NOT DETECTED Final   Metapneumovirus NOT DETECTED NOT DETECTED Final   Rhinovirus / Enterovirus NOT DETECTED NOT DETECTED Final   Influenza A NOT DETECTED NOT DETECTED Final   Influenza B NOT DETECTED NOT DETECTED Final   Parainfluenza Virus 1 NOT DETECTED NOT DETECTED Final   Parainfluenza Virus 2 NOT DETECTED NOT DETECTED Final   Parainfluenza Virus 3 NOT DETECTED NOT DETECTED Final   Parainfluenza Virus 4 NOT DETECTED NOT DETECTED Final   Respiratory Syncytial Virus NOT DETECTED NOT DETECTED Final   Bordetella pertussis NOT DETECTED NOT DETECTED Final   Bordetella Parapertussis NOT DETECTED NOT DETECTED Final   Chlamydophila pneumoniae NOT DETECTED NOT DETECTED Final   Mycoplasma pneumoniae NOT DETECTED NOT DETECTED Final  Comment: Performed at [facility], [address], [place], [place] [address]  MRSA Next Gen by PCR, Nasal     Status: None   Collection Time: 07/21/23 11:47 AM   Specimen: Nasal Mucosa; Nasal Swab  Result Value Ref Range Status   MRSA by PCR Next Gen NOT DETECTED NOT DETECTED Final    Comment: (NOTE) The GeneXpert MRSA Assay (FDA approved for NASAL specimens only), is one component of a comprehensive MRSA colonization surveillance program. It is not intended to diagnose MRSA infection nor to guide or monitor treatment for MRSA infections. Test performance is not FDA approved in patients less than 56 years old. Performed at [facility], [address], [place], [place] [address]      Radiology Studies: MR PROSTATE W WO CONTRAST Result Date: 07/25/2023 CLINICAL DATA:  Prostatic abscess EXAM: MR PROSTATE WITHOUT AND WITH CONTRAST TECHNIQUE: Multiplanar multisequence MRI images were obtained of the pelvis centered about the prostate. Pre and post contrast images were obtained. CONTRAST:  8mL GADAVIST  GADOBUTROL  1 MMOL/ML IV SOLN COMPARISON:  None Available. FINDINGS: Prostate: There is an hourglass shaped/barbell shaped fluid collection which involves the bladder, extends through the prostate gland, and expands inferior to the prostate gland and base of the penis. This is well seen on coronal image 140/MR series 601. Collection is also seen on sagittal image 138/600. The bladder is thick-walled with bilateral hydroureter. Communicating fluid collection inferior to the prostate gland measures 3.8 cm in diameter. No enhancement along the course of the fluid collection within the prostate gland or inferiorly to suggest acute prostatitis or abscess. IMPRESSION: Communicating fluid collection extending from the bladder through the prostate gland and with re-expansion inferior to the pelvic floor. No enhancement to suggest abscess. Electronically Signed   By: Jackquline Boxer M.D.   On: 07/25/2023 09:42    Scheduled Meds:  cyanocobalamin   1,000 mcg Intramuscular Weekly   dapsone   100 mg Oral Daily   Darunavir -Cobicistat-Emtricitabine -Tenofovir  Alafenamide  1 tablet Oral Q breakfast   enoxaparin  (LOVENOX ) injection  40 mg Subcutaneous Q24H   feeding supplement  237 mL Oral BID BM   ferrous sulfate   325 mg Oral BID WC   midodrine   5 mg Oral TID WC   multivitamin with minerals  1 tablet Oral Daily   pantoprazole   40 mg Oral Daily   senna-docusate  2 tablet Oral BID   sodium bicarbonate   1,300 mg Oral BID   tamsulosin   0.4 mg Oral QPC supper   thiamine   100 mg Oral Daily   Continuous Infusions:  ertapenem  1 g (07/26/23  0806)     LOS: 6 days   Time spent: 40 minutes  Camellia Door, DO  [facility]  07/26/2023, 9:34 AM

## 2023-07-27 DIAGNOSIS — B342 Coronavirus infection, unspecified: Secondary | ICD-10-CM | POA: Diagnosis not present

## 2023-07-27 DIAGNOSIS — A539 Syphilis, unspecified: Secondary | ICD-10-CM

## 2023-07-27 DIAGNOSIS — B20 Human immunodeficiency virus [HIV] disease: Secondary | ICD-10-CM | POA: Diagnosis not present

## 2023-07-27 DIAGNOSIS — N412 Abscess of prostate: Secondary | ICD-10-CM | POA: Diagnosis not present

## 2023-07-27 DIAGNOSIS — Z1612 Extended spectrum beta lactamase (ESBL) resistance: Secondary | ICD-10-CM | POA: Diagnosis not present

## 2023-07-27 DIAGNOSIS — N390 Urinary tract infection, site not specified: Secondary | ICD-10-CM | POA: Diagnosis not present

## 2023-07-27 DIAGNOSIS — N429 Disorder of prostate, unspecified: Secondary | ICD-10-CM | POA: Diagnosis not present

## 2023-07-27 LAB — MISC LABCORP TEST (SEND OUT): Labcorp test code: 164798

## 2023-07-27 LAB — BLASTOMYCES ANTIGEN
Blastomyces Antigen: NOT DETECTED ng/mL
Interpretation: NEGATIVE

## 2023-07-27 LAB — HISTOPLASMA ANTIGEN, URINE: Histoplasma Antigen, urine: NEGATIVE (ref <0.2)

## 2023-07-27 NOTE — Progress Notes (Addendum)
 Subjective: No new complaints   Antibiotics:  Anti-infectives (From admission, onward)    Start     Dose/Rate Route Frequency Ordered Stop   07/23/23 1700  Darunavir -Cobicistat-Emtricitabine -Tenofovir  Alafenamide (SYMTUZA ) 800-150-200-10 MG TABS 1 tablet        1 tablet Oral Daily with breakfast 07/23/23 1538     07/23/23 1630  dapsone  tablet 100 mg        100 mg Oral Daily 07/23/23 1538     07/21/23 1200  ertapenem  (INVANZ ) 1 g in sodium chloride  0.9 % 100 mL IVPB        1 g 200 mL/hr over 30 Minutes Intravenous Every 24 hours 07/21/23 1101     07/21/23 1000  metroNIDAZOLE  (FLAGYL ) IVPB 500 mg  Status:  Discontinued        500 mg 100 mL/hr over 60 Minutes Intravenous Every 12 hours 07/20/23 2202 07/21/23 1101   07/21/23 0800  linezolid  (ZYVOX ) IVPB 600 mg  Status:  Discontinued        600 mg 300 mL/hr over 60 Minutes Intravenous Every 12 hours 07/20/23 2202 07/23/23 1415   07/20/23 2300  ceFEPIme  (MAXIPIME ) 2 g in sodium chloride  0.9 % 100 mL IVPB  Status:  Discontinued        2 g 200 mL/hr over 30 Minutes Intravenous Every 8 hours 07/20/23 2205 07/21/23 1101   07/20/23 1445  linezolid  (ZYVOX ) IVPB 600 mg        600 mg 300 mL/hr over 60 Minutes Intravenous  Once 07/20/23 1437 07/20/23 1829   07/20/23 1430  aztreonam (AZACTAM) 2 g in sodium chloride  0.9 % 100 mL IVPB  Status:  Discontinued        2 g 200 mL/hr over 30 Minutes Intravenous  Once 07/20/23 1417 07/20/23 1419   07/20/23 1430  metroNIDAZOLE  (FLAGYL ) IVPB 500 mg        500 mg 100 mL/hr over 60 Minutes Intravenous  Once 07/20/23 1417 07/20/23 1727   07/20/23 1430  ceFEPIme  (MAXIPIME ) 2 g in sodium chloride  0.9 % 100 mL IVPB        2 g 200 mL/hr over 30 Minutes Intravenous  Once 07/20/23 1419 07/20/23 1557       Medications: Scheduled Meds:  cyanocobalamin   1,000 mcg Intramuscular Weekly   dapsone   100 mg Oral Daily   Darunavir -Cobicistat-Emtricitabine -Tenofovir  Alafenamide  1 tablet Oral Q breakfast    enoxaparin  (LOVENOX ) injection  40 mg Subcutaneous Q24H   feeding supplement  237 mL Oral BID BM   ferrous sulfate   325 mg Oral BID WC   midodrine   5 mg Oral TID WC   multivitamin with minerals  1 tablet Oral Daily   pantoprazole   40 mg Oral Daily   senna-docusate  2 tablet Oral BID   sodium bicarbonate   1,300 mg Oral BID   tamsulosin   0.4 mg Oral QPC supper   thiamine   100 mg Oral Daily   Continuous Infusions:  ertapenem  1 g (07/27/23 0911)   PRN Meds:.acetaminophen , ondansetron  (ZOFRAN ) IV    Objective: Weight change:   Intake/Output Summary (Last 24 hours) at 07/27/2023 1248 Last data filed at 07/27/2023 0809 Gross per 24 hour  Intake 480 ml  Output 300 ml  Net 180 ml   Blood pressure 96/72, pulse 78, temperature 98.2 F (36.8 C), temperature source Oral, resp. rate 18, height 6' 1 (1.854 m), weight 83.5 kg, SpO2 97%. Temp:  [97.8 F (36.6 C)-99.8 F (37.7 C)] 98.2  F (36.8 C) (01/02 0808) Pulse Rate:  [71-89] 78 (01/02 0808) Resp:  [16-18] 18 (01/02 0808) BP: (95-97)/(57-72) 96/72 (01/02 0808) SpO2:  [97 %-100 %] 97 % (01/02 9191)  Physical Exam: Physical Exam Constitutional:      Appearance: He is cachectic.  Eyes:     General:        Right eye: No discharge.        Left eye: No discharge.  Cardiovascular:     Rate and Rhythm: Normal rate and regular rhythm.  Pulmonary:     Effort: Pulmonary effort is normal. No respiratory distress.     Breath sounds: No wheezing.  Abdominal:     General: There is no distension.  Neurological:     General: No focal deficit present.     Mental Status: He is oriented to person, place, and time.  Psychiatric:        Mood and Affect: Mood normal.        Behavior: Behavior normal.        Thought Content: Thought content normal.        Judgment: Judgment normal.      CBC:    BMET No results for input(s): NA, K, CL, CO2, GLUCOSE, BUN, CREATININE, CALCIUM  in the last 72 hours.   Liver Panel  No  results for input(s): PROT, ALBUMIN , AST, ALT, ALKPHOS, BILITOT, BILIDIR, IBILI in the last 72 hours.     Sedimentation Rate No results for input(s): ESRSEDRATE in the last 72 hours. C-Reactive Protein No results for input(s): CRP in the last 72 hours.  Micro Results: Recent Results (from the past 720 hours)  Culture, blood (Routine x 2)     Status: None   Collection Time: 07/20/23  1:10 PM   Specimen: BLOOD LEFT HAND  Result Value Ref Range Status   Specimen Description BLOOD LEFT HAND  Final   Special Requests   Final    BOTTLES DRAWN AEROBIC AND ANAEROBIC Blood Culture results may not be optimal due to an inadequate volume of blood received in culture bottles   Culture   Final    NO GROWTH 5 DAYS Performed at [facility], [address], [place], [place] [address]    Report Status 07/25/2023 FINAL  Final  Urine Culture     Status: Abnormal   Collection Time: 07/20/23  3:13 PM   Specimen: Urine, Clean Catch  Result Value Ref Range Status   Specimen Description URINE, CLEAN CATCH  Final   Special Requests   Final    NONE Reflexed from [id number] Performed at [facility], [address], [place], [place] [address]    Culture (A)  Final    >=100,000 COLONIES/mL ESCHERICHIA COLI CORRECTED ON 12/30 AT 9188: PREVIOUSLY REPORTED AS >=100,000 COLONIES/mL GRAM NEGATIVE RODS Confirmed Extended Spectrum Beta-Lactamase Producer (ESBL).  In bloodstream infections from ESBL organisms, carbapenems are preferred over piperacillin /tazobactam. They are shown to have a lower risk of mortality.    Report Status 07/24/2023 FINAL  Final   Organism ID, Bacteria ESCHERICHIA COLI (A)  Final      Susceptibility   Escherichia coli - MIC*    AMPICILLIN >=32 RESISTANT Resistant     CEFAZOLIN  >=64 RESISTANT Resistant     CEFEPIME  16 RESISTANT Resistant     CEFTRIAXONE  >=64 RESISTANT Resistant     CIPROFLOXACIN  >=4 RESISTANT Resistant     GENTAMICIN  <=1 SENSITIVE  Sensitive     IMIPENEM <=0.25 SENSITIVE Sensitive  NITROFURANTOIN  <=16 SENSITIVE Sensitive     TRIMETH /SULFA  >=320 RESISTANT Resistant     AMPICILLIN/SULBACTAM 4 SENSITIVE Sensitive     PIP/TAZO <=4 SENSITIVE Sensitive ug/mL    * >=100,000 COLONIES/mL ESCHERICHIA COLI CORRECTED ON 12/30 AT 9188: PREVIOUSLY REPORTED AS >=100,000 COLONIES/mL GRAM NEGATIVE RODS  Respiratory (~20 pathogens) panel by PCR     Status: Abnormal   Collection Time: 07/20/23  6:06 PM   Specimen: Nasopharyngeal Swab; Respiratory  Result Value Ref Range Status   Adenovirus NOT DETECTED NOT DETECTED Final   Coronavirus 229E NOT DETECTED NOT DETECTED Final    Comment: (NOTE) The Coronavirus on the Respiratory Panel, DOES NOT test for the novel  Coronavirus (2019 nCoV)    Coronavirus HKU1 NOT DETECTED NOT DETECTED Final   Coronavirus NL63 NOT DETECTED NOT DETECTED Final   Coronavirus OC43 DETECTED (A) NOT DETECTED Final   Metapneumovirus NOT DETECTED NOT DETECTED Final   Rhinovirus / Enterovirus NOT DETECTED NOT DETECTED Final   Influenza A NOT DETECTED NOT DETECTED Final   Influenza B NOT DETECTED NOT DETECTED Final   Parainfluenza Virus 1 NOT DETECTED NOT DETECTED Final   Parainfluenza Virus 2 NOT DETECTED NOT DETECTED Final   Parainfluenza Virus 3 NOT DETECTED NOT DETECTED Final   Parainfluenza Virus 4 NOT DETECTED NOT DETECTED Final   Respiratory Syncytial Virus NOT DETECTED NOT DETECTED Final   Bordetella pertussis NOT DETECTED NOT DETECTED Final   Bordetella Parapertussis NOT DETECTED NOT DETECTED Final   Chlamydophila pneumoniae NOT DETECTED NOT DETECTED Final   Mycoplasma pneumoniae NOT DETECTED NOT DETECTED Final    Comment: Performed at [facility], [address] [place], [place] [address]  Blastomyces Antigen     Status: None   Collection Time: 07/20/23  9:54 PM   Specimen: Blood  Result Value Ref Range Status   Blastomyces Antigen None Detected None Detected ng/mL Final    Comment:  (NOTE) Reference Interval: None Detected Reportable Range: 0.31 ng/mL - 20.00 ng/mL Results above 20.00 ng/mL are reported as 'Positive, Above the Limit of Quantification' This test was developed and its performance characteristics determined by [employer]. It has not been cleared or approved by the FDA; however, FDA clearance or approval is not currently required for clinical use. The results are not intended to be used as the sole means for clinical diagnosis or patient decisions.    Interpretation Negative  Final   Specimen Type SERUM  Final    Comment: (NOTE) Performed At: [facility] [address] [place], [place] [id number] [place] MD Ey:[id number]   MRSA Next Gen by PCR, Nasal     Status: None   Collection Time: 07/21/23 11:47 AM   Specimen: Nasal Mucosa; Nasal Swab  Result Value Ref Range Status   MRSA by PCR Next Gen NOT DETECTED NOT DETECTED Final    Comment: (NOTE) The GeneXpert MRSA Assay (FDA approved for NASAL specimens only), is one component of a comprehensive MRSA colonization surveillance program. It is not intended to diagnose MRSA infection nor to guide or monitor treatment for MRSA infections. Test performance is not FDA approved in patients less than 25 years old. Performed at [facility], [address], [place], [place] [address]     Studies/Results: No results found.    Assessment/Plan:  INTERVAL HISTORY:     Principal Problem:   UTI due to extended-spectrum beta lactamase (ESBL) producing Escherichia coli Active Problems:   Homelessness   Pancytopenia (HCC)  Coronavirus infection   AKI (acute kidney injury) (HCC)   Sepsis due to Escherichia coli (E. 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  Culprit organism is ESBL E coli  #1 ESBL complicated abscess:  --continue Invanz  --await transfer to tertiary care center  #2 HIV/AIDS:  --continue Symtuza  and dapsone   #3 Syphilis: has had fourfold decline in titers  #3 . OC43: on droplet precautions  I have personally spent 50 minutes involved in face-to-face and non-face-to-face activities for this patient on the day of the visit. Professional time spent includes the following activities: Preparing to see the patient (review of tests), Obtaining and/or reviewing separately obtained history (admission/discharge record), Performing a medically appropriate examination and/or evaluation , Ordering medications/tests/procedures, referring and communicating with other health care professionals, Documenting clinical information in the EMR, Independently interpreting results (not separately reported), Communicating results to the patient/family/caregiver, Counseling and educating the patient/family/caregiver and Care coordination (not separately reported).      LOS: 7 days   [name] 07/27/2023, 12:48 PM

## 2023-07-27 NOTE — Progress Notes (Signed)
 PROGRESS NOTE    Jacob Gutierrez  FMW:[id number] DOB: [birth year] DOA: 07/20/2023 PCP: No primary care provider on file.  Subjective: Pt seen and examined. Stable. Afebrile. No complaints. Remains in droplet isolation due to coronavirus infection.   Hospital Course: HPI: Jacob Gutierrez is a 38 y.o. male with medical history significant of HIV/AIDS, medication noncompliance, remote substance abuse, pulmonary coccidiomycosis, syphilis, gonorrhea, monkeypox, ESBL E. coli UTI and HTN who presents to the ED for evaluation of dysuria, fevers and chills. Patient reports he is followed by the [facility] here in [place] but he moved to [place]  and has been out of his HIV medications since June/July. He has had a history of UTIs and prostatitis and was recently admitted for sepsis due to UTI last month in [place] . He came to [facility] for the holidays and has continued to have urinary symptoms including difficulty urinating, pain with urination and dark urine. Over the last 3-4 days, he has had intermittent fevers and sweats. He also endorsed mild dyspnea on exertion and fatigue but denies any cough, headaches, dizziness, penile discharge, abdominal pain, shortness of breath or chest pain. He is sexually active and his last sexual encounter was on unprotected sexual intercourse with a male about a month ago. He reports prior history of substance abuse but has been abstinent for years.   Significant Events: Admitted 07/20/2023 for UTI   Significant Labs: sodium 129, K+ 3.9, bicarb 19, creatinine 1.76, albumin  2.7, WBC 1.9, Hgb 10.7, platelet 130, PT/INR 15.0/1.2, glucose 86, lactic acid 0.6, UA shows significant hemoglobinuria, proteinuria, positive nitrite, moderate leuks, RBC >50, WBC >50, and many bacteria  Urine Cx with ESBL E. coli  Significant Imaging Studies: Chest x-ray with no active cardiopulmonary disease   Antibiotic Therapy: Anti-infectives (From admission, onward)    Start      Dose/Rate Route Frequency Ordered Stop   07/23/23 1700  Darunavir -Cobicistat-Emtricitabine -Tenofovir  Alafenamide (SYMTUZA ) 800-150-200-10 MG TABS 1 tablet        1 tablet Oral Daily with breakfast 07/23/23 1538     07/23/23 1630  dapsone  tablet 100 mg        100 mg Oral Daily 07/23/23 1538     07/21/23 1200  ertapenem  (INVANZ ) 1 g in sodium chloride  0.9 % 100 mL IVPB        1 g 200 mL/hr over 30 Minutes Intravenous Every 24 hours 07/21/23 1101     07/21/23 1000  metroNIDAZOLE  (FLAGYL ) IVPB 500 mg  Status:  Discontinued        500 mg 100 mL/hr over 60 Minutes Intravenous Every 12 hours 07/20/23 2202 07/21/23 1101   07/21/23 0800  linezolid  (ZYVOX ) IVPB 600 mg  Status:  Discontinued        600 mg 300 mL/hr over 60 Minutes Intravenous Every 12 hours 07/20/23 2202 07/23/23 1415   07/20/23 2300  ceFEPIme  (MAXIPIME ) 2 g in sodium chloride  0.9 % 100 mL IVPB  Status:  Discontinued        2 g 200 mL/hr over 30 Minutes Intravenous Every 8 hours 07/20/23 2205 07/21/23 1101   07/20/23 1445  linezolid  (ZYVOX ) IVPB 600 mg        600 mg 300 mL/hr over 60 Minutes Intravenous  Once 07/20/23 1437 07/20/23 1829   07/20/23 1430  aztreonam (AZACTAM) 2 g in sodium chloride  0.9 % 100 mL IVPB  Status:  Discontinued        2 g 200 mL/hr over 30 Minutes Intravenous  Once 07/20/23 1417 07/20/23 1419  07/20/23 1430  metroNIDAZOLE  (FLAGYL ) IVPB 500 mg        500 mg 100 mL/hr over 60 Minutes Intravenous  Once 07/20/23 1417 07/20/23 1727   07/20/23 1430  ceFEPIme  (MAXIPIME ) 2 g in sodium chloride  0.9 % 100 mL IVPB        2 g 200 mL/hr over 30 Minutes Intravenous  Once 07/20/23 1419 07/20/23 1557       Procedures:   Consultants:     Assessment and Plan: * UTI due to extended-spectrum beta lactamase (ESBL) producing Escherichia coli Since admission, in the setting of immunocompromised state due to Hiv/aids and non compliance to meds.  - continue with broad spectrum antibiotics. Currently on Ertapenem  ,  Linezolid  discontinued.  - urine cultures growing  >100K  ESBL. Sensitive to ertapenem .  07-24-2023 remains on IV Invanz . Day #4. Awaiting ID f/u note. 07-25-2023 ID wants to treat with 14 days of IV Invanz . Today is Day #5 07-26-2023 per ID, unknown end date of IV Invanz . Continue IV Invanz  per ID service. 07-27-2023 continue with IV Invanz . Day #7. End date per ID service.    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  Per urology, this is a complex, finding and may require management at a tertiary care center with possible perineal excision of what is effectively a bladder diverticulum into the prostate  07-26-2023 he will need referral to tertiary care center with urologic surgery service that can manage this complex prostate/bladder anatomy after he has been treated for his ESBL E. Coli infection. 07-27-2023 urology to arrange outpatient referral to [facility] or tertiary care center.  Sepsis due to Escherichia coli (E. coli) (HCC) 07-24-2023 present on admission. On admission, temp 101.7, RR 22, HR 104. UA shows significant hemoglobinuria, proteinuria, positive nitrite, moderate leuks, RBC >50, WBC >50, and many bacteria.  Sepsis has resolved with treatment of his E. Coli UTI.  Febrile illness 07-24-2023 related to his untreated HIV, coronavirus infection and ESBL e. Coli infection. 07-25-2023 he is afebrile now.  Symptomatic HIV infection (HCC) 07-24-2023 seen by ID. Started on Sumtuza, dapsone  for PJP prophylaxis. 07-25-2023 continue with HIV meds and dapsone  for PJP prophylaxis.  07-26-2023 stable on Symtuza  and dapsone  07-27-2023 no evidence of reconstitution syndrome. Stable.  Neutropenia with fever (HCC) 07-25-2023 related to his untreated HIV. 07-25-2022 fever has resolved.  AKI (acute kidney injury) (HCC) 07-24-2023 baseline SCR 1.3 from  [facility] where labs.  AKI has resolved with IVF.  Coronavirus infection 07-25-2023 Not covid-19.  Continue with supportive care. On RA. 07-26-2023 awaiting ID recs on how long he needs to remain in droplet isolation. 01-02-205 await ID to remove droplet isolation precautions.  Pancytopenia (HCC) 07-25-2023 related to his untreated HIV.  Homelessness 07-24-2023 pt states his mother lives in [place]. TOC consulted for assistance in housing. Pt just returned from [place]  where he was staying with family friend.  07-25-2023 TOC is engaged and pt may ultimately need to go to homeless shelter at discharge.  History of syphilis 07-25-2023 pt with prior hx of syphilis.  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  His titer has decreased with prior treatment. RPR titer drop from 1:128 to 1:16 reflects adequate response to treatment back in March 2024.    Malnutrition of moderate degree 07-24-2023 see RD documentation from today. Moderate Malnutrition related to chronic illness as evidenced by mild fat depletion, moderate  muscle depletion.    DVT prophylaxis: enoxaparin  (LOVENOX ) injection 40 mg Start: 07/20/23 2200    Code Status: Full Code Family Communication: no family at bedside Disposition Plan: homeless shelter Reason for continuing need for hospitalization: remains on IV Abx.  Objective: Vitals:   07/26/23 1638 07/26/23 2115 07/27/23 0607 07/27/23 0808  BP: (!) 97/57 (!) 96/58 95/65 96/72   Pulse: 82 89 71 78  Resp: 16 18 18 18   Temp: 99.8 F (37.7 C) 97.8 F (36.6 C) 98 F (36.7 C) 98.2 F (36.8 C)  TempSrc: Oral Oral Oral Oral  SpO2: 97% 98% 100% 97%  Weight:      Height:        Intake/Output Summary (Last 24 hours) at 07/27/2023 1103 Last data filed at 07/27/2023 0809 Gross per 24 hour  Intake 480 ml  Output 300 ml  Net 180 ml   Filed Weights   07/21/23 0900 07/21/23 1528  Weight: 84.6 kg 83.5 kg    Examination:  Physical Exam Vitals and nursing note  reviewed.  Constitutional:      General: He is not in acute distress.    Appearance: He is not toxic-appearing.     Comments: Chronically ill appearing  HENT:     Head: Normocephalic.     Nose: Nose normal.  Cardiovascular:     Rate and Rhythm: Normal rate and regular rhythm.  Pulmonary:     Effort: Pulmonary effort is normal.     Breath sounds: Normal breath sounds.  Abdominal:     General: Abdomen is flat.  Skin:    Capillary Refill: Capillary refill takes less than 2 seconds.  Neurological:     Mental Status: He is alert and oriented to person, place, and time.     Data Reviewed: I have personally reviewed following labs and imaging studies  CBC: Recent Labs  Lab 07/20/23 1310 07/21/23 0750 07/22/23 0610 07/24/23 0614  WBC 1.9* 1.8* 1.4*  --   NEUTROABS 0.8*  --  1.2*  --   HGB 10.7* 9.7* 8.5* 9.5*  HCT 31.8* 29.1* 25.3*  --   MCV 90.6 90.7 90.7  --   PLT 130* 120* 118*  --    Basic Metabolic Panel: Recent Labs  Lab 07/21/23 0750 07/21/23 1159 07/22/23 0610 07/23/23 0554 07/24/23 0614  NA 128* 130* 131* 135 137  K 3.7 3.9 3.7 4.0 3.7  CL 102 103 104 108 108  CO2 18* 19* 22 20* 22  GLUCOSE 114* 140* 88 103* 99  BUN 13 13 10 13 11   CREATININE 1.57* 1.60* 1.49* 1.37* 1.29*  CALCIUM  7.4* 7.6* 7.6* 8.0* 8.0*  MG  --   --  1.8  --   --    GFR: Estimated Creatinine Clearance: 86.9 mL/min (A) (by C-G formula based on SCr of 1.29 mg/dL (H)). Liver Function Tests: Recent Labs  Lab 07/20/23 1310 07/21/23 1159 07/22/23 0610 07/23/23 0554 07/24/23 0614  AST 20 19 16 28  182*  ALT 16 11 11 14  82*  ALKPHOS 48 40 39 39 50  BILITOT 1.0 0.5 0.5 0.5 0.3  PROT 9.8* 7.7 7.2 7.9 7.7  ALBUMIN  2.7* 1.9* 1.9* 2.0* 2.1*   Coagulation Profile: Recent Labs  Lab 07/20/23 1310  INR 1.2   Sepsis Labs: Recent Labs  Lab 07/20/23 1322 07/20/23 1506  LATICACIDVEN 0.6 1.0    Recent Results (from the past 240 hours)  Culture, blood (Routine x 2)     Status: None    Collection  Time: 07/20/23  1:10 PM   Specimen: BLOOD LEFT HAND  Result Value Ref Range Status   Specimen Description BLOOD LEFT HAND  Final   Special Requests   Final    BOTTLES DRAWN AEROBIC AND ANAEROBIC Blood Culture results may not be optimal due to an inadequate volume of blood received in culture bottles   Culture   Final    NO GROWTH 5 DAYS Performed at [facility], [address] [place], [place] [address]    Report Status 07/25/2023 FINAL  Final  Urine Culture     Status: Abnormal   Collection Time: 07/20/23  3:13 PM   Specimen: Urine, Clean Catch  Result Value Ref Range Status   Specimen Description URINE, CLEAN CATCH  Final   Special Requests   Final    NONE Reflexed from [id number] Performed at [facility], [address] [place], [place] [address]    Culture (A)  Final    >=100,000 COLONIES/mL ESCHERICHIA COLI CORRECTED ON 12/30 AT 9188: PREVIOUSLY REPORTED AS >=100,000 COLONIES/mL GRAM NEGATIVE RODS Confirmed Extended Spectrum Beta-Lactamase Producer (ESBL).  In bloodstream infections from ESBL organisms, carbapenems are preferred over piperacillin /tazobactam. They are shown to have a lower risk of mortality.    Report Status 07/24/2023 FINAL  Final   Organism ID, Bacteria ESCHERICHIA COLI (A)  Final      Susceptibility   Escherichia coli - MIC*    AMPICILLIN >=32 RESISTANT Resistant     CEFAZOLIN  >=64 RESISTANT Resistant     CEFEPIME  16 RESISTANT Resistant     CEFTRIAXONE  >=64 RESISTANT Resistant     CIPROFLOXACIN  >=4 RESISTANT Resistant     GENTAMICIN  <=1 SENSITIVE Sensitive     IMIPENEM <=0.25 SENSITIVE Sensitive     NITROFURANTOIN  <=16 SENSITIVE Sensitive     TRIMETH /SULFA  >=320 RESISTANT Resistant     AMPICILLIN/SULBACTAM 4 SENSITIVE Sensitive     PIP/TAZO <=4 SENSITIVE Sensitive ug/mL    * >=100,000 COLONIES/mL ESCHERICHIA COLI CORRECTED ON 12/30 AT 9188: PREVIOUSLY REPORTED AS >=100,000 COLONIES/mL GRAM NEGATIVE RODS  Respiratory (~20  pathogens) panel by PCR     Status: Abnormal   Collection Time: 07/20/23  6:06 PM   Specimen: Nasopharyngeal Swab; Respiratory  Result Value Ref Range Status   Adenovirus NOT DETECTED NOT DETECTED Final   Coronavirus 229E NOT DETECTED NOT DETECTED Final    Comment: (NOTE) The Coronavirus on the Respiratory Panel, DOES NOT test for the novel  Coronavirus (2019 nCoV)    Coronavirus HKU1 NOT DETECTED NOT DETECTED Final   Coronavirus NL63 NOT DETECTED NOT DETECTED Final   Coronavirus OC43 DETECTED (A) NOT DETECTED Final   Metapneumovirus NOT DETECTED NOT DETECTED Final   Rhinovirus / Enterovirus NOT DETECTED NOT DETECTED Final   Influenza A NOT DETECTED NOT DETECTED Final   Influenza B NOT DETECTED NOT DETECTED Final   Parainfluenza Virus 1 NOT DETECTED NOT DETECTED Final   Parainfluenza Virus 2 NOT DETECTED NOT DETECTED Final   Parainfluenza Virus 3 NOT DETECTED NOT DETECTED Final   Parainfluenza Virus 4 NOT DETECTED NOT DETECTED Final   Respiratory Syncytial Virus NOT DETECTED NOT DETECTED Final   Bordetella pertussis NOT DETECTED NOT DETECTED Final   Bordetella Parapertussis NOT DETECTED NOT DETECTED Final   Chlamydophila pneumoniae NOT DETECTED NOT DETECTED Final   Mycoplasma pneumoniae NOT DETECTED NOT DETECTED Final    Comment: Performed at [facility], [address], [place], [place] [address]  Blastomyces Antigen  Status: None   Collection Time: 07/20/23  9:54 PM   Specimen: Blood  Result Value Ref Range Status   Blastomyces Antigen None Detected None Detected ng/mL Final    Comment: (NOTE) Reference Interval: None Detected Reportable Range: 0.31 ng/mL - 20.00 ng/mL Results above 20.00 ng/mL are reported as 'Positive, Above the Limit of Quantification' This test was developed and its performance characteristics determined by [employer]. It has not been cleared or approved by the FDA; however, FDA clearance or approval is not currently required for  clinical use. The results are not intended to be used as the sole means for clinical diagnosis or patient decisions.    Interpretation Negative  Final   Specimen Type SERUM  Final    Comment: (NOTE) Performed At: [facility] [address] [place], [place] [id number] [place] MD Ey:[id number]   MRSA Next Gen by PCR, Nasal     Status: None   Collection Time: 07/21/23 11:47 AM   Specimen: Nasal Mucosa; Nasal Swab  Result Value Ref Range Status   MRSA by PCR Next Gen NOT DETECTED NOT DETECTED Final    Comment: (NOTE) The GeneXpert MRSA Assay (FDA approved for NASAL specimens only), is one component of a comprehensive MRSA colonization surveillance program. It is not intended to diagnose MRSA infection nor to guide or monitor treatment for MRSA infections. Test performance is not FDA approved in patients less than 78 years old. Performed at [facility], [address], [place], [place] [address]      Radiology Studies: No results found.  Scheduled Meds:  cyanocobalamin   1,000 mcg Intramuscular Weekly   dapsone   100 mg Oral Daily   Darunavir -Cobicistat-Emtricitabine -Tenofovir  Alafenamide  1 tablet Oral Q breakfast   enoxaparin  (LOVENOX ) injection  40 mg Subcutaneous Q24H   feeding supplement  237 mL Oral BID BM   ferrous sulfate   325 mg Oral BID WC   midodrine   5 mg Oral TID WC   multivitamin with minerals  1 tablet Oral Daily   pantoprazole   40 mg Oral Daily   senna-docusate  2 tablet Oral BID   sodium bicarbonate   1,300 mg Oral BID   tamsulosin   0.4 mg Oral QPC supper   thiamine   100 mg Oral Daily   Continuous Infusions:  ertapenem  1 g (07/27/23 0911)     LOS: 7 days   Time spent: 40 minutes  Camellia Door, DO  [facility]  07/27/2023, 11:03 AM

## 2023-07-27 NOTE — Plan of Care (Signed)
  Problem: Pain Management: Goal: General experience of comfort will improve Outcome: Progressing   Problem: Safety: Goal: Ability to remain free from injury will improve Outcome: Progressing

## 2023-07-27 NOTE — Progress Notes (Addendum)
     Subjective: Jacob Gutierrez. Pt included his mother on the phone so we could review the plan of care. She disclosed that he was presently homeless and lacked any real resources. All questions were answered to their satisfaction.  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 This would require surgical intervention at a tertiary care center. Attempting to consult Dr. Levonia of [facility], as he has extensive reconstructive experience.   Addendum: Transfer is not being sought out by Urology. This was obtained from ID note. Pt will not be a surgical candidate for some time.   Reviewed that patients medical compliance and maintenance of his immune system would be essential to his recovery and eventual surgical candidacy. He and his mother expressed understanding. He will need any assistance case management can provide to address his housing insecurity in the interim.   He reports to be voiding completely at this time and PVR was 8mL. No immediate surgical indication.   ABX and antivirals per ID  Urology will follow along peripherally  Intake/Output from previous day: 01/01 0701 - 01/02 0700 In: 480 [P.O.:480] Out: 200 [Urine:200]  Intake/Output this shift: No intake/output data recorded.  Physical Exam:  General: Alert and oriented CV: No cyanosis Lungs: equal chest rise Abdomen: Soft, NTND, no rebound or guarding   Lab Results: No results for input(s): HGB, HCT in the last 72 hours. BMET No results for input(s): NA, K, CL, CO2, GLUCOSE, BUN, CREATININE, CALCIUM  in the last 72 hours.   Studies/Results: No results  found.    LOS: 7 days   Ole Bourdon, NP [facility] Pager: [phone]  07/27/2023, 8:33 PM

## 2023-07-27 NOTE — Plan of Care (Signed)

## 2023-07-28 ENCOUNTER — Other Ambulatory Visit (HOSPITAL_COMMUNITY): Payer: Self-pay

## 2023-07-28 ENCOUNTER — Telehealth (HOSPITAL_COMMUNITY): Payer: Self-pay | Admitting: Pharmacy Technician

## 2023-07-28 ENCOUNTER — Telehealth: Payer: Self-pay | Admitting: Infectious Diseases

## 2023-07-28 DIAGNOSIS — B342 Coronavirus infection, unspecified: Secondary | ICD-10-CM | POA: Diagnosis not present

## 2023-07-28 DIAGNOSIS — N390 Urinary tract infection, site not specified: Secondary | ICD-10-CM

## 2023-07-28 DIAGNOSIS — N429 Disorder of prostate, unspecified: Secondary | ICD-10-CM | POA: Diagnosis not present

## 2023-07-28 DIAGNOSIS — B20 Human immunodeficiency virus [HIV] disease: Secondary | ICD-10-CM | POA: Diagnosis not present

## 2023-07-28 DIAGNOSIS — N323 Diverticulum of bladder: Secondary | ICD-10-CM

## 2023-07-28 DIAGNOSIS — B9629 Other Escherichia coli [E. coli] as the cause of diseases classified elsewhere: Secondary | ICD-10-CM

## 2023-07-28 DIAGNOSIS — N412 Abscess of prostate: Secondary | ICD-10-CM

## 2023-07-28 LAB — MIC RESULT

## 2023-07-28 NOTE — Progress Notes (Signed)
 PROGRESS NOTE    Jacob Gutierrez  FMW:[id number] DOB: July 25, 1986 DOA: 07/20/2023 PCP: No primary care provider on file.  Subjective: Pt seen and examined. Stable. Afebrile. No complaints. Remains in droplet isolation due to coronavirus infection.  Pt is eating well. States he is getting enough to eat.   Hospital Course: HPI: Jacob Gutierrez is a 38 y.o. male with medical history significant of HIV/AIDS, medication noncompliance, remote substance abuse, pulmonary coccidiomycosis, syphilis, gonorrhea, monkeypox, ESBL E. coli UTI and HTN who presents to the ED for evaluation of dysuria, fevers and chills. Patient reports he is followed by the [facility] here in [place] but he moved to [place]  and has been out of his HIV medications since June/July. He has had a history of UTIs and prostatitis and was recently admitted for sepsis due to UTI last month in [place] . He came to [facility] for the holidays and has continued to have urinary symptoms including difficulty urinating, pain with urination and dark urine. Over the last 3-4 days, he has had intermittent fevers and sweats. He also endorsed mild dyspnea on exertion and fatigue but denies any cough, headaches, dizziness, penile discharge, abdominal pain, shortness of breath or chest pain. He is sexually active and his last sexual encounter was on unprotected sexual intercourse with a male about a month ago. He reports prior history of substance abuse but has been abstinent for years.   Significant Events: Admitted 07/20/2023 for UTI   Significant Labs: sodium 129, K+ 3.9, bicarb 19, creatinine 1.76, albumin  2.7, WBC 1.9, Hgb 10.7, platelet 130, PT/INR 15.0/1.2, glucose 86, lactic acid 0.6, UA shows significant hemoglobinuria, proteinuria, positive nitrite, moderate leuks, RBC >50, WBC >50, and many bacteria  Urine Cx with ESBL E. coli  Significant Imaging Studies: Chest x-ray with no active cardiopulmonary disease   Antibiotic  Therapy: Anti-infectives (From admission, onward)    Start     Dose/Rate Route Frequency Ordered Stop   07/23/23 1700  Darunavir -Cobicistat-Emtricitabine -Tenofovir  Alafenamide (SYMTUZA ) 800-150-200-10 MG TABS 1 tablet        1 tablet Oral Daily with breakfast 07/23/23 1538     07/23/23 1630  dapsone  tablet 100 mg        100 mg Oral Daily 07/23/23 1538     07/21/23 1200  ertapenem  (INVANZ ) 1 g in sodium chloride  0.9 % 100 mL IVPB        1 g 200 mL/hr over 30 Minutes Intravenous Every 24 hours 07/21/23 1101     07/21/23 1000  metroNIDAZOLE  (FLAGYL ) IVPB 500 mg  Status:  Discontinued        500 mg 100 mL/hr over 60 Minutes Intravenous Every 12 hours 07/20/23 2202 07/21/23 1101   07/21/23 0800  linezolid  (ZYVOX ) IVPB 600 mg  Status:  Discontinued        600 mg 300 mL/hr over 60 Minutes Intravenous Every 12 hours 07/20/23 2202 07/23/23 1415   07/20/23 2300  ceFEPIme  (MAXIPIME ) 2 g in sodium chloride  0.9 % 100 mL IVPB  Status:  Discontinued        2 g 200 mL/hr over 30 Minutes Intravenous Every 8 hours 07/20/23 2205 07/21/23 1101   07/20/23 1445  linezolid  (ZYVOX ) IVPB 600 mg        600 mg 300 mL/hr over 60 Minutes Intravenous  Once 07/20/23 1437 07/20/23 1829   07/20/23 1430  aztreonam (AZACTAM) 2 g in sodium chloride  0.9 % 100 mL IVPB  Status:  Discontinued        2 g  200 mL/hr over 30 Minutes Intravenous  Once 07/20/23 1417 07/20/23 1419   07/20/23 1430  metroNIDAZOLE  (FLAGYL ) IVPB 500 mg        500 mg 100 mL/hr over 60 Minutes Intravenous  Once 07/20/23 1417 07/20/23 1727   07/20/23 1430  ceFEPIme  (MAXIPIME ) 2 g in sodium chloride  0.9 % 100 mL IVPB        2 g 200 mL/hr over 30 Minutes Intravenous  Once 07/20/23 1419 07/20/23 1557       Procedures:   Consultants:     Assessment and Plan: * UTI due to extended-spectrum beta lactamase (ESBL) producing Escherichia coli Since admission, in the setting of immunocompromised state due to Hiv/aids and non compliance to meds.  -  continue with broad spectrum antibiotics. Currently on Ertapenem  , Linezolid  discontinued.  - urine cultures growing  >100K  ESBL. Sensitive to ertapenem .  07-24-2023 remains on IV Invanz . Day #4. Awaiting ID f/u note. 07-25-2023 ID wants to treat with 14 days of IV Invanz . Today is Day #5 07-26-2023 per ID, unknown end date of IV Invanz . Continue IV Invanz  per ID service. 07-27-2023 continue with IV Invanz . Day #7. End date per ID service. 07-28-2023 Invanz  Day #8. End date of IV abx therapy per ID service.    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  Per urology, this is a complex, finding and may require management at a tertiary care center with possible perineal excision of what is effectively a bladder diverticulum into the prostate  07-26-2023 he will need referral to tertiary care center with urologic surgery service that can manage this complex prostate/bladder anatomy after he has been treated for his ESBL E. Coli infection. 07-27-2023 urology to arrange outpatient referral to [facility] or tertiary care center.  07-28-2023 urology planning on outpatient referral to [facility]/tertiary care center. Pt is NOT going to be transferred during this hospitalization.  Sepsis due to Escherichia coli (E. coli) (HCC) 07-24-2023 present on admission. On admission, temp 101.7, RR 22, HR 104. UA shows significant hemoglobinuria, proteinuria, positive nitrite, moderate leuks, RBC >50, WBC >50, and many bacteria.  Sepsis has resolved with treatment of his E. Coli UTI.  Febrile illness 07-24-2023 related to his untreated HIV, coronavirus infection and ESBL e. Coli infection. 07-25-2023 he is afebrile now.  Symptomatic HIV infection (HCC) 07-24-2023 seen by ID. Started on Sumtuza, dapsone  for PJP prophylaxis. 07-25-2023 continue with HIV meds and dapsone  for PJP  prophylaxis.  07-26-2023 stable on Symtuza  and dapsone  07-27-2023 no evidence of reconstitution syndrome. Stable. 07-28-2023 tolerating Symtuza  and dapsone .  Neutropenia with fever (HCC) 07-25-2023 related to his untreated HIV. 07-25-2022 fever has resolved.  AKI (acute kidney injury) (HCC) 07-24-2023 baseline SCR 1.3 from [facility] where labs.  AKI has resolved with IVF.  Coronavirus infection 07-25-2023 Not covid-19.  Continue with supportive care. On RA. 07-26-2023 awaiting ID recs on how long he needs to remain in droplet isolation. [id number] await ID to remove droplet isolation precautions. 07-28-2023 stable. On RA. Pt has been in droplet isolation since admission. Defer to ID service to remove droplet isolation.  Pancytopenia (HCC) 07-25-2023 related to his untreated HIV.  Homelessness 07-24-2023 pt states his mother lives in [place]. TOC consulted for assistance in housing. Pt just returned from [place]  where he was staying with family friend.  07-25-2023 TOC is engaged and pt may ultimately need to go to homeless shelter  at discharge.  History of syphilis 07-25-2023 pt with prior hx of syphilis.  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  His titer has decreased with prior treatment. RPR titer drop from 1:128 to 1:16 reflects adequate response to treatment back in March 2024.    Malnutrition of moderate degree 07-24-2023 see RD documentation from today. Moderate Malnutrition related to chronic illness as evidenced by mild fat depletion, moderate muscle depletion.    DVT prophylaxis: enoxaparin  (LOVENOX ) injection 40 mg Start: 07/20/23 2200    Code Status: Full Code Family Communication: no family at bedside Disposition Plan: homeless shelter Reason for continuing need for hospitalization: remains on IV ABX.  Objective: Vitals:   07/27/23 1441 07/27/23 2100 07/28/23 0604 07/28/23 0907  BP: 90/66 94/64 98/67  99/60  Pulse: 79 78 71 88  Resp: 17 18 18 17    Temp: 98 F (36.7 C) 98.3 F (36.8 C) 98.4 F (36.9 C) 98 F (36.7 C)  TempSrc: Oral Oral Oral Oral  SpO2: 97% 97% 100%   Weight:      Height:        Intake/Output Summary (Last 24 hours) at 07/28/2023 1053 Last data filed at 07/27/2023 1442 Gross per 24 hour  Intake 120 ml  Output --  Net 120 ml   Filed Weights   07/21/23 0900 07/21/23 1528  Weight: 84.6 kg 83.5 kg    Examination:  Physical Exam Vitals and nursing note reviewed.  Constitutional:      General: He is not in acute distress.    Appearance: He is not toxic-appearing or diaphoretic.  HENT:     Head: Normocephalic and atraumatic.     Nose: Nose normal.  Cardiovascular:     Rate and Rhythm: Normal rate and regular rhythm.  Pulmonary:     Effort: Pulmonary effort is normal.     Breath sounds: Normal breath sounds.  Abdominal:     General: Bowel sounds are normal. There is no distension.     Palpations: Abdomen is soft.  Skin:    General: Skin is warm and dry.     Capillary Refill: Capillary refill takes less than 2 seconds.  Neurological:     Mental Status: He is alert and oriented to person, place, and time.     Data Reviewed: I have personally reviewed following labs and imaging studies  CBC: Recent Labs  Lab 07/22/23 0610 07/24/23 0614  WBC 1.4*  --   NEUTROABS 1.2*  --   HGB 8.5* 9.5*  HCT 25.3*  --   MCV 90.7  --   PLT 118*  --    Basic Metabolic Panel: Recent Labs  Lab 07/21/23 1159 07/22/23 0610 07/23/23 0554 07/24/23 0614  NA 130* 131* 135 137  K 3.9 3.7 4.0 3.7  CL 103 104 108 108  CO2 19* 22 20* 22  GLUCOSE 140* 88 103* 99  BUN 13 10 13 11   CREATININE 1.60* 1.49* 1.37* 1.29*  CALCIUM  7.6* 7.6* 8.0* 8.0*  MG  --  1.8  --   --    GFR: Estimated Creatinine Clearance: 86.9 mL/min (A) (by C-G formula based on SCr of 1.29 mg/dL (H)). Liver Function Tests: Recent Labs  Lab 07/21/23 1159 07/22/23 0610 07/23/23 0554 07/24/23 0614  AST 19 16 28  182*  ALT 11 11 14  82*   ALKPHOS 40 39 39 50  BILITOT 0.5 0.5 0.5 0.3  PROT 7.7 7.2 7.9 7.7  ALBUMIN  1.9* 1.9* 2.0* 2.1*    Recent  Results (from the past 240 hours)  Culture, blood (Routine x 2)     Status: None   Collection Time: 07/20/23  1:10 PM   Specimen: BLOOD LEFT HAND  Result Value Ref Range Status   Specimen Description BLOOD LEFT HAND  Final   Special Requests   Final    BOTTLES DRAWN AEROBIC AND ANAEROBIC Blood Culture results may not be optimal due to an inadequate volume of blood received in culture bottles   Culture   Final    NO GROWTH 5 DAYS Performed at [facility], [address], [place], [place] [address]    Report Status 07/25/2023 FINAL  Final  Urine Culture     Status: Abnormal   Collection Time: 07/20/23  3:13 PM   Specimen: Urine, Clean Catch  Result Value Ref Range Status   Specimen Description URINE, CLEAN CATCH  Final   Special Requests   Final    NONE Reflexed from [id number] Performed at [facility], [address] [place], [place] [address]    Culture (A)  Final    >=100,000 COLONIES/mL ESCHERICHIA COLI CORRECTED ON 12/30 AT 9188: PREVIOUSLY REPORTED AS >=100,000 COLONIES/mL GRAM NEGATIVE RODS Confirmed Extended Spectrum Beta-Lactamase Producer (ESBL).  In bloodstream infections from ESBL organisms, carbapenems are preferred over piperacillin /tazobactam. They are shown to have a lower risk of mortality.    Report Status 07/24/2023 FINAL  Final   Organism ID, Bacteria ESCHERICHIA COLI (A)  Final      Susceptibility   Escherichia coli - MIC*    AMPICILLIN >=32 RESISTANT Resistant     CEFAZOLIN  >=64 RESISTANT Resistant     CEFEPIME  16 RESISTANT Resistant     CEFTRIAXONE  >=64 RESISTANT Resistant     CIPROFLOXACIN  >=4 RESISTANT Resistant     GENTAMICIN  <=1 SENSITIVE Sensitive     IMIPENEM <=0.25 SENSITIVE Sensitive     NITROFURANTOIN  <=16 SENSITIVE Sensitive     TRIMETH /SULFA  >=320 RESISTANT Resistant     AMPICILLIN/SULBACTAM 4 SENSITIVE Sensitive      PIP/TAZO <=4 SENSITIVE Sensitive ug/mL    * >=100,000 COLONIES/mL ESCHERICHIA COLI CORRECTED ON 12/30 AT 9188: PREVIOUSLY REPORTED AS >=100,000 COLONIES/mL GRAM NEGATIVE RODS  Respiratory (~20 pathogens) panel by PCR     Status: Abnormal   Collection Time: 07/20/23  6:06 PM   Specimen: Nasopharyngeal Swab; Respiratory  Result Value Ref Range Status   Adenovirus NOT DETECTED NOT DETECTED Final   Coronavirus 229E NOT DETECTED NOT DETECTED Final    Comment: (NOTE) The Coronavirus on the Respiratory Panel, DOES NOT test for the novel  Coronavirus (2019 nCoV)    Coronavirus HKU1 NOT DETECTED NOT DETECTED Final   Coronavirus NL63 NOT DETECTED NOT DETECTED Final   Coronavirus OC43 DETECTED (A) NOT DETECTED Final   Metapneumovirus NOT DETECTED NOT DETECTED Final   Rhinovirus / Enterovirus NOT DETECTED NOT DETECTED Final   Influenza A NOT DETECTED NOT DETECTED Final   Influenza B NOT DETECTED NOT DETECTED Final   Parainfluenza Virus 1 NOT DETECTED NOT DETECTED Final   Parainfluenza Virus 2 NOT DETECTED NOT DETECTED Final   Parainfluenza Virus 3 NOT DETECTED NOT DETECTED Final   Parainfluenza Virus 4 NOT DETECTED NOT DETECTED Final   Respiratory Syncytial Virus NOT DETECTED NOT DETECTED Final   Bordetella pertussis NOT DETECTED NOT DETECTED Final   Bordetella Parapertussis NOT DETECTED NOT DETECTED Final   Chlamydophila pneumoniae NOT DETECTED NOT DETECTED Final   Mycoplasma pneumoniae NOT DETECTED NOT DETECTED Final  Comment: Performed at [facility], [address] [place], [place] [address]  Blastomyces Antigen     Status: None   Collection Time: 07/20/23  9:54 PM   Specimen: Blood  Result Value Ref Range Status   Blastomyces Antigen None Detected None Detected ng/mL Final    Comment: (NOTE) Reference Interval: None Detected Reportable Range: 0.31 ng/mL - 20.00 ng/mL Results above 20.00 ng/mL are reported as 'Positive, Above the Limit of Quantification' This test was  developed and its performance characteristics determined by [employer]. It has not been cleared or approved by the FDA; however, FDA clearance or approval is not currently required for clinical use. The results are not intended to be used as the sole means for clinical diagnosis or patient decisions.    Interpretation Negative  Final   Specimen Type SERUM  Final    Comment: (NOTE) Performed At: [facility], [place] [address] [place], [place] [id number] [place] MD Ey:[id number]   MRSA Next Gen by PCR, Nasal     Status: None   Collection Time: 07/21/23 11:47 AM   Specimen: Nasal Mucosa; Nasal Swab  Result Value Ref Range Status   MRSA by PCR Next Gen NOT DETECTED NOT DETECTED Final    Comment: (NOTE) The GeneXpert MRSA Assay (FDA approved for NASAL specimens only), is one component of a comprehensive MRSA colonization surveillance program. It is not intended to diagnose MRSA infection nor to guide or monitor treatment for MRSA infections. Test performance is not FDA approved in patients less than 35 years old. Performed at [facility], [address], [place], [place] [address]      Radiology Studies: No results found.  Scheduled Meds:  cyanocobalamin   1,000 mcg Intramuscular Weekly   dapsone   100 mg Oral Daily   Darunavir -Cobicistat-Emtricitabine -Tenofovir  Alafenamide  1 tablet Oral Q breakfast   enoxaparin  (LOVENOX ) injection  40 mg Subcutaneous Q24H   feeding supplement  237 mL Oral BID BM   ferrous sulfate   325 mg Oral BID WC   midodrine   5 mg Oral TID WC   multivitamin with minerals  1 tablet Oral Daily   pantoprazole   40 mg Oral Daily   senna-docusate  2 tablet Oral BID   sodium bicarbonate   1,300 mg Oral BID   tamsulosin   0.4 mg Oral QPC supper   Continuous Infusions:  ertapenem  1 g (07/28/23 0842)     LOS: 8 days   Time spent: 40 minutes  Camellia Door, DO  [facility]  07/28/2023, 10:53 AM

## 2023-07-28 NOTE — Telephone Encounter (Signed)
 In discussion with Dr. Watt with [facility] during [facility] inpatient stay:   The attending at [facility] is [facility]. Our resident is going to see him Monday and review the films to see his thoughts. If you wanted to go ahead and place an order for an ambulatory referral to Dr Levonia, that would start the process. He may not have much to offer and if not we may need to consider cystoscopy under anesthesia with possibly unroofing of the fluid collection as an outpatient.   Will place referral to start process for outpatient care.  No options for [facility] CM/SW team to help with transportation between facilities.  Maybe [facility] has transportation help to offer?

## 2023-07-28 NOTE — Telephone Encounter (Signed)
 Pharmacy Patient Advocate Encounter  Received notification from [employer] that Prior Authorization for Nuzyra  150MG  tablets  has been DENIED.  Full denial letter will be uploaded to the media tab. See denial reason below. NUZYRA  TAB 150MG  is not FDA approved for your medical condition(s): Acute Bacterial Prostate abscess with susceptible ESBL Escherichia coli. These condition(s) are not supported by one of the accepted references. Therefore your drug is denied because it is not being used for a medically accepted indication.   PA #/Case ID/Reference #: [id number]

## 2023-07-28 NOTE — Telephone Encounter (Signed)
 Patient Product/process development scientist completed.    The patient is insured through [facility]. Patient has Medicare and is not eligible for a copay card, but may be able to apply for patient assistance or Medicare RX Payment Plan (Patient Must reach out to their plan, if eligible for payment plan), if available.    Ran test claim for Nuzyra  150 mg and Product Not Covered   This test claim was processed through [employer]- copay amounts may vary at other pharmacies due to [employer], or as the patient moves through the different stages of their insurance plan.     Morgan Arab, CPHT Pharmacy Technician III Certified Patient Advocate [facility] Pharmacy Patient Advocate Team Direct Number: [phone]  Fax: [phone]

## 2023-07-28 NOTE — Telephone Encounter (Signed)
 Pharmacy Patient Advocate Encounter   Received notification that prior authorization for Nuzyra  150MG  tablets is required/requested.   Insurance verification completed.   The patient is insured through [facility] .   Per test claim: PA required; PA submitted to above mentioned insurance via CoverMyMeds Key/confirmation #/EOC [id number] Status is pending

## 2023-07-29 DIAGNOSIS — B342 Coronavirus infection, unspecified: Secondary | ICD-10-CM | POA: Diagnosis not present

## 2023-07-29 DIAGNOSIS — N429 Disorder of prostate, unspecified: Secondary | ICD-10-CM | POA: Diagnosis not present

## 2023-07-29 DIAGNOSIS — N390 Urinary tract infection, site not specified: Secondary | ICD-10-CM | POA: Diagnosis not present

## 2023-07-29 DIAGNOSIS — B20 Human immunodeficiency virus [HIV] disease: Secondary | ICD-10-CM | POA: Diagnosis not present

## 2023-07-29 MED ORDER — CLOTRIMAZOLE 1 % EX CREA
TOPICAL_CREAM | Freq: Two times a day (BID) | CUTANEOUS | Status: DC
  Administered 2023-08-01: 1 via TOPICAL
  Filled 2023-07-29 (×2): qty 15

## 2023-07-29 NOTE — Progress Notes (Signed)
      INFECTIOUS DISEASE ATTENDING ADDENDUM:   Date: 07/29/2023  Patient name: Jacob Gutierrez  Medical record number: [id number]  Date of birth: Jul 19, 1986   We have no suitable oral agent for the patient I am extending his ertapenem  until we have data that we do or do not have such an agent   [name] 07/29/2023, 1:57 PM

## 2023-07-29 NOTE — Plan of Care (Signed)

## 2023-07-29 NOTE — Progress Notes (Addendum)
 PROGRESS NOTE    Jacob Gutierrez  FMW:[id number] DOB: 1985-09-04 DOA: 07/20/2023 PCP: No primary care provider on file.  Subjective: Pt seen and examined. Stable. Afebrile. Asks about a dry patch of skin on his right leg.   Hospital Course: HPI: Jacob Gutierrez is a 38 y.o. male with medical history significant of HIV/AIDS, medication noncompliance, remote substance abuse, pulmonary coccidiomycosis, syphilis, gonorrhea, monkeypox, ESBL E. coli UTI and HTN who presents to the ED for evaluation of dysuria, fevers and chills. Patient reports he is followed by the [facility] here in [place] but he moved to [place]  and has been out of his HIV medications since June/July. He has had a history of UTIs and prostatitis and was recently admitted for sepsis due to UTI last month in [place] . He came to [facility] for the holidays and has continued to have urinary symptoms including difficulty urinating, pain with urination and dark urine. Over the last 3-4 days, he has had intermittent fevers and sweats. He also endorsed mild dyspnea on exertion and fatigue but denies any cough, headaches, dizziness, penile discharge, abdominal pain, shortness of breath or chest pain. He is sexually active and his last sexual encounter was on unprotected sexual intercourse with a male about a month ago. He reports prior history of substance abuse but has been abstinent for years.   Significant Events: Admitted 07/20/2023 for UTI   Significant Labs: sodium 129, K+ 3.9, bicarb 19, creatinine 1.76, albumin  2.7, WBC 1.9, Hgb 10.7, platelet 130, PT/INR 15.0/1.2, glucose 86, lactic acid 0.6, UA shows significant hemoglobinuria, proteinuria, positive nitrite, moderate leuks, RBC >50, WBC >50, and many bacteria  Urine Cx with ESBL E. coli  Significant Imaging Studies: Chest x-ray with no active cardiopulmonary disease   Antibiotic Therapy: Anti-infectives (From admission, onward)    Start     Dose/Rate Route Frequency Ordered  Stop   07/23/23 1700  Darunavir -Cobicistat-Emtricitabine -Tenofovir  Alafenamide (SYMTUZA ) 800-150-200-10 MG TABS 1 tablet        1 tablet Oral Daily with breakfast 07/23/23 1538     07/23/23 1630  dapsone  tablet 100 mg        100 mg Oral Daily 07/23/23 1538     07/21/23 1200  ertapenem  (INVANZ ) 1 g in sodium chloride  0.9 % 100 mL IVPB        1 g 200 mL/hr over 30 Minutes Intravenous Every 24 hours 07/21/23 1101     07/21/23 1000  metroNIDAZOLE  (FLAGYL ) IVPB 500 mg  Status:  Discontinued        500 mg 100 mL/hr over 60 Minutes Intravenous Every 12 hours 07/20/23 2202 07/21/23 1101   07/21/23 0800  linezolid  (ZYVOX ) IVPB 600 mg  Status:  Discontinued        600 mg 300 mL/hr over 60 Minutes Intravenous Every 12 hours 07/20/23 2202 07/23/23 1415   07/20/23 2300  ceFEPIme  (MAXIPIME ) 2 g in sodium chloride  0.9 % 100 mL IVPB  Status:  Discontinued        2 g 200 mL/hr over 30 Minutes Intravenous Every 8 hours 07/20/23 2205 07/21/23 1101   07/20/23 1445  linezolid  (ZYVOX ) IVPB 600 mg        600 mg 300 mL/hr over 60 Minutes Intravenous  Once 07/20/23 1437 07/20/23 1829   07/20/23 1430  aztreonam (AZACTAM) 2 g in sodium chloride  0.9 % 100 mL IVPB  Status:  Discontinued        2 g 200 mL/hr over 30 Minutes Intravenous  Once 07/20/23 1417 07/20/23  1419   07/20/23 1430  metroNIDAZOLE  (FLAGYL ) IVPB 500 mg        500 mg 100 mL/hr over 60 Minutes Intravenous  Once 07/20/23 1417 07/20/23 1727   07/20/23 1430  ceFEPIme  (MAXIPIME ) 2 g in sodium chloride  0.9 % 100 mL IVPB        2 g 200 mL/hr over 30 Minutes Intravenous  Once 07/20/23 1419 07/20/23 1557       Procedures:   Consultants:     Assessment and Plan: * UTI due to extended-spectrum beta lactamase (ESBL) producing Escherichia coli Since admission, in the setting of immunocompromised state due to Hiv/aids and non compliance to meds.  - continue with broad spectrum antibiotics. Currently on Ertapenem  , Linezolid  discontinued.  - urine  cultures growing  >100K  ESBL. Sensitive to ertapenem .  07-24-2023 remains on IV Invanz . Day #4. Awaiting ID f/u note. 07-25-2023 ID wants to treat with 14 days of IV Invanz . Today is Day #5 07-26-2023 per ID, unknown end date of IV Invanz . Continue IV Invanz  per ID service. 07-27-2023 continue with IV Invanz . Day #7. End date per ID service. 07-28-2023 Invanz  Day #8. End date of IV abx therapy per ID service. 07-29-2023 Invanz  Day #9. End date of IV abx therapy per ID service. Discussed with ID yesterday. They are awaiting MIC on oral alternative. MIC won't be back until next week.    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  Per urology, this is a complex, finding and may require management at a tertiary care center with possible perineal excision of what is effectively a bladder diverticulum into the prostate  07-26-2023 he will need referral to tertiary care center with urologic surgery service that can manage this complex prostate/bladder anatomy after he has been treated for his ESBL E. Coli infection. 07-27-2023 urology to arrange outpatient referral to [facility] or tertiary care center.  07-28-2023 urology planning on outpatient referral to [facility]/tertiary care center. Pt is NOT going to be transferred during this hospitalization. 07-29-2023 confirmed with urology that transfer during this admission to tertiary care center is not possible. Urology service finding out if [facility] will even agree to see him as outpatient.  Sepsis due to Escherichia coli (E. coli) (HCC) 07-24-2023 present on admission. On admission, temp 101.7, RR 22, HR 104. UA shows significant hemoglobinuria, proteinuria, positive nitrite, moderate leuks, RBC >50, WBC >50, and many bacteria.  Sepsis has resolved with treatment of his E. Coli UTI.  Febrile illness 07-24-2023 related to his  untreated HIV, coronavirus infection and ESBL e. Coli infection. 07-25-2023 he is afebrile now.  Symptomatic HIV infection (HCC) 07-24-2023 seen by ID. Started on Sumtuza, dapsone  for PJP prophylaxis. 07-25-2023 continue with HIV meds and dapsone  for PJP prophylaxis.  07-26-2023 stable on Symtuza  and dapsone  07-27-2023 no evidence of reconstitution syndrome. Stable. 07-28-2023 tolerating Symtuza  and dapsone . 07-29-2023 stable.  Neutropenia with fever (HCC) 07-25-2023 related to his untreated HIV. 07-25-2022 fever has resolved.  AKI (acute kidney injury) (HCC) 07-24-2023 baseline SCR 1.3 from [facility] where labs.  AKI has resolved with IVF.  Coronavirus infection 07-25-2023 Not covid-19.  Continue with supportive care. On RA. 07-26-2023 awaiting ID recs on how long he needs to remain in droplet isolation. [id number] await ID to remove droplet isolation precautions. 07-28-2023 stable. On RA. Pt has been in droplet isolation since admission. Defer to ID service to remove droplet isolation.  Pancytopenia (HCC) 07-25-2023 related to his untreated HIV.  Homelessness 07-24-2023 pt states his mother lives in [place]. TOC consulted for assistance in housing. Pt just returned from [place]  where he was staying with family friend.  07-25-2023 TOC is engaged and pt may ultimately need to go to homeless shelter at discharge.  History of syphilis 07-25-2023 pt with prior hx of syphilis.  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  His titer has decreased with prior treatment. RPR titer drop from 1:128 to 1:16 reflects adequate response to treatment back in March 2024.    Malnutrition of moderate degree 07-24-2023 see RD documentation from today. Moderate Malnutrition related to chronic illness as evidenced by mild fat depletion, moderate muscle depletion.        DVT prophylaxis: enoxaparin  (LOVENOX ) injection 40 mg Start: 07/20/23 2200    Code Status: Full Code Family  Communication: no family at bedside Disposition Plan: homeless shelter Reason for continuing need for hospitalization: remains on IV Abx.  Objective: Vitals:   07/28/23 0907 07/28/23 2150 07/29/23 0543 07/29/23 0744  BP: 99/60 94/61 94/61  103/66  Pulse: 88 81 82 80  Resp: 17 18 18 16   Temp: 98 F (36.7 C) 97.9 F (36.6 C) 98.9 F (37.2 C) 98.4 F (36.9 C)  TempSrc: Oral Oral Oral Oral  SpO2:  98% 98% 96%  Weight:      Height:        Intake/Output Summary (Last 24 hours) at 07/29/2023 1204 Last data filed at 07/28/2023 2000 Gross per 24 hour  Intake 960 ml  Output --  Net 960 ml   Filed Weights   07/21/23 0900 07/21/23 1528  Weight: 84.6 kg 83.5 kg    Examination:  Physical Exam Vitals and nursing note reviewed.  Constitutional:      General: He is not in acute distress.    Appearance: He is not toxic-appearing or diaphoretic.  HENT:     Head: Normocephalic and atraumatic.     Nose: Nose normal.  Eyes:     General: No scleral icterus. Cardiovascular:     Rate and Rhythm: Normal rate and regular rhythm.  Pulmonary:     Effort: Pulmonary effort is normal.     Breath sounds: Normal breath sounds.  Abdominal:     General: Abdomen is flat. Bowel sounds are normal.     Palpations: Abdomen is soft.  Skin:    Capillary Refill: Capillary refill takes less than 2 seconds.     Comments: Dry, rough patch of skin on right lower leg. Likely superficial fungal infection. Not ringworm.  Neurological:     Mental Status: He is alert and oriented to person, place, and time.     Data Reviewed: I have personally reviewed following labs and imaging studies  CBC: Recent Labs  Lab 07/24/23 0614  HGB 9.5*   Basic Metabolic Panel: Recent Labs  Lab 07/23/23 0554 07/24/23 0614  NA 135 137  K 4.0 3.7  CL 108 108  CO2 20* 22  GLUCOSE 103* 99  BUN 13 11  CREATININE 1.37* 1.29*  CALCIUM  8.0* 8.0*   GFR: Estimated Creatinine Clearance: 86.9 mL/min (A) (by C-G formula  based on SCr of 1.29 mg/dL (H)). Liver Function Tests: Recent Labs  Lab 07/23/23 0554 07/24/23 0614  AST 28 182*  ALT 14 82*  ALKPHOS 39 50  BILITOT 0.5 0.3  PROT 7.9 7.7  ALBUMIN  2.0* 2.1*    Recent Results (from the past 240 hours)  Culture,  blood (Routine x 2)     Status: None   Collection Time: 07/20/23  1:10 PM   Specimen: BLOOD LEFT HAND  Result Value Ref Range Status   Specimen Description BLOOD LEFT HAND  Final   Special Requests   Final    BOTTLES DRAWN AEROBIC AND ANAEROBIC Blood Culture results may not be optimal due to an inadequate volume of blood received in culture bottles   Culture   Final    NO GROWTH 5 DAYS Performed at [facility], [address], [place], [place] [address]    Report Status 07/25/2023 FINAL  Final  Urine Culture     Status: Abnormal   Collection Time: 07/20/23  3:13 PM   Specimen: Urine, Clean Catch  Result Value Ref Range Status   Specimen Description URINE, CLEAN CATCH  Final   Special Requests   Final    NONE Reflexed from [id number] Performed at [facility], [address], [place], [place] [address]    Culture (A)  Final    >=100,000 COLONIES/mL ESCHERICHIA COLI CORRECTED ON 12/30 AT 9188: PREVIOUSLY REPORTED AS >=100,000 COLONIES/mL GRAM NEGATIVE RODS Confirmed Extended Spectrum Beta-Lactamase Producer (ESBL).  In bloodstream infections from ESBL organisms, carbapenems are preferred over piperacillin /tazobactam. They are shown to have a lower risk of mortality.    Report Status 07/24/2023 FINAL  Final   Organism ID, Bacteria ESCHERICHIA COLI (A)  Final      Susceptibility   Escherichia coli - MIC*    AMPICILLIN >=32 RESISTANT Resistant     CEFAZOLIN  >=64 RESISTANT Resistant     CEFEPIME  16 RESISTANT Resistant     CEFTRIAXONE  >=64 RESISTANT Resistant     CIPROFLOXACIN  >=4 RESISTANT Resistant     GENTAMICIN  <=1 SENSITIVE Sensitive     IMIPENEM <=0.25 SENSITIVE Sensitive     NITROFURANTOIN  <=16 SENSITIVE Sensitive      TRIMETH /SULFA  >=320 RESISTANT Resistant     AMPICILLIN/SULBACTAM 4 SENSITIVE Sensitive     PIP/TAZO <=4 SENSITIVE Sensitive ug/mL    * >=100,000 COLONIES/mL ESCHERICHIA COLI CORRECTED ON 12/30 AT 9188: PREVIOUSLY REPORTED AS >=100,000 COLONIES/mL GRAM NEGATIVE RODS  Respiratory (~20 pathogens) panel by PCR     Status: Abnormal   Collection Time: 07/20/23  6:06 PM   Specimen: Nasopharyngeal Swab; Respiratory  Result Value Ref Range Status   Adenovirus NOT DETECTED NOT DETECTED Final   Coronavirus 229E NOT DETECTED NOT DETECTED Final    Comment: (NOTE) The Coronavirus on the Respiratory Panel, DOES NOT test for the novel  Coronavirus (2019 nCoV)    Coronavirus HKU1 NOT DETECTED NOT DETECTED Final   Coronavirus NL63 NOT DETECTED NOT DETECTED Final   Coronavirus OC43 DETECTED (A) NOT DETECTED Final   Metapneumovirus NOT DETECTED NOT DETECTED Final   Rhinovirus / Enterovirus NOT DETECTED NOT DETECTED Final   Influenza A NOT DETECTED NOT DETECTED Final   Influenza B NOT DETECTED NOT DETECTED Final   Parainfluenza Virus 1 NOT DETECTED NOT DETECTED Final   Parainfluenza Virus 2 NOT DETECTED NOT DETECTED Final   Parainfluenza Virus 3 NOT DETECTED NOT DETECTED Final   Parainfluenza Virus 4 NOT DETECTED NOT DETECTED Final   Respiratory Syncytial Virus NOT DETECTED NOT DETECTED Final   Bordetella pertussis NOT DETECTED NOT DETECTED Final   Bordetella Parapertussis NOT DETECTED NOT DETECTED Final   Chlamydophila pneumoniae NOT DETECTED NOT DETECTED Final   Mycoplasma pneumoniae NOT DETECTED NOT DETECTED Final    Comment: Performed at [facility], [address] [place], [place] [address]  Blastomyces Antigen     Status: None   Collection Time: 07/20/23  9:54 PM   Specimen: Blood  Result Value Ref Range Status   Blastomyces Antigen None Detected None Detected ng/mL Final    Comment: (NOTE) Reference Interval: None Detected Reportable Range: 0.31 ng/mL - 20.00 ng/mL Results above  20.00 ng/mL are reported as 'Positive, Above the Limit of Quantification' This test was developed and its performance characteristics determined by [employer]. It has not been cleared or approved by the FDA; however, FDA clearance or approval is not currently required for clinical use. The results are not intended to be used as the sole means for clinical diagnosis or patient decisions.    Interpretation Negative  Final   Specimen Type SERUM  Final    Comment: (NOTE) Performed At: [facility] [address] [place], [place] [id number] [place] Pac MD Ey:[id number]   MRSA Next Gen by PCR, Nasal     Status: None   Collection Time: 07/21/23 11:47 AM   Specimen: Nasal Mucosa; Nasal Swab  Result Value Ref Range Status   MRSA by PCR Next Gen NOT DETECTED NOT DETECTED Final    Comment: (NOTE) The GeneXpert MRSA Assay (FDA approved for NASAL specimens only), is one component of a comprehensive MRSA colonization surveillance program. It is not intended to diagnose MRSA infection nor to guide or monitor treatment for MRSA infections. Test performance is not FDA approved in patients less than 72 years old. Performed at [facility], [address], [place], [place] [address]   MIC (1 Drug)-     Status: None (Preliminary result)   Collection Time: 07/25/23  2:06 PM  Result Value Ref Range Status   Min Inhibitory Conc (1 Drug) REFERT  Final    Comment: (NOTE) Please refer to the following specimen for additional lab results. Please refer to spec # [id number] for test result. Almarie Schneider notified 07-28-2023 at 17:55 AM. Performed At: [facility] [address] [place], [place] [id number] Jennette Shorter MD Ey:[phone]    Source PENDING  Incomplete     Radiology Studies: No results found.  Scheduled Meds:  clotrimazole    Topical BID   cyanocobalamin   1,000 mcg Intramuscular Weekly   dapsone   100 mg Oral Daily    Darunavir -Cobicistat-Emtricitabine -Tenofovir  Alafenamide  1 tablet Oral Q breakfast   enoxaparin  (LOVENOX ) injection  40 mg Subcutaneous Q24H   feeding supplement  237 mL Oral BID BM   ferrous sulfate   325 mg Oral BID WC   midodrine   5 mg Oral TID WC   multivitamin with minerals  1 tablet Oral Daily   pantoprazole   40 mg Oral Daily   senna-docusate  2 tablet Oral BID   sodium bicarbonate   1,300 mg Oral BID   tamsulosin   0.4 mg Oral QPC supper   Continuous Infusions:  ertapenem  1 g (07/29/23 1001)     LOS: 9 days   Time spent: 40 minutes  Camellia Door, DO  [facility]  07/29/2023, 12:04 PM

## 2023-07-30 DIAGNOSIS — N429 Disorder of prostate, unspecified: Secondary | ICD-10-CM | POA: Diagnosis not present

## 2023-07-30 DIAGNOSIS — B342 Coronavirus infection, unspecified: Secondary | ICD-10-CM | POA: Diagnosis not present

## 2023-07-30 DIAGNOSIS — B20 Human immunodeficiency virus [HIV] disease: Secondary | ICD-10-CM | POA: Diagnosis not present

## 2023-07-30 DIAGNOSIS — N390 Urinary tract infection, site not specified: Secondary | ICD-10-CM | POA: Diagnosis not present

## 2023-07-30 NOTE — Plan of Care (Signed)

## 2023-07-30 NOTE — Progress Notes (Signed)
 PROGRESS NOTE    Jacob Gutierrez  FMW:[id number] DOB: November 16, 1985 DOA: 07/20/2023 PCP: No primary care provider on file.  Subjective: Pt seen and examined. Stable. Afebrile. No complaints. Afebrile. On RA.   Hospital Course: HPI: Jacob Gutierrez is a 38 y.o. male with medical history significant of HIV/AIDS, medication noncompliance, remote substance abuse, pulmonary coccidiomycosis, syphilis, gonorrhea, monkeypox, ESBL E. coli UTI and HTN who presents to the ED for evaluation of dysuria, fevers and chills. Patient reports he is followed by the [facility] here in [place] but he moved to [place]  and has been out of his HIV medications since June/July. He has had a history of UTIs and prostatitis and was recently admitted for sepsis due to UTI last month in [place] . He came to [facility] for the holidays and has continued to have urinary symptoms including difficulty urinating, pain with urination and dark urine. Over the last 3-4 days, he has had intermittent fevers and sweats. He also endorsed mild dyspnea on exertion and fatigue but denies any cough, headaches, dizziness, penile discharge, abdominal pain, shortness of breath or chest pain. He is sexually active and his last sexual encounter was on unprotected sexual intercourse with a male about a month ago. He reports prior history of substance abuse but has been abstinent for years.   Significant Events: Admitted 07/20/2023 for UTI   Significant Labs: sodium 129, K+ 3.9, bicarb 19, creatinine 1.76, albumin  2.7, WBC 1.9, Hgb 10.7, platelet 130, PT/INR 15.0/1.2, glucose 86, lactic acid 0.6, UA shows significant hemoglobinuria, proteinuria, positive nitrite, moderate leuks, RBC >50, WBC >50, and many bacteria  Urine Cx with ESBL E. coli  Significant Imaging Studies: Chest x-ray with no active cardiopulmonary disease   Antibiotic Therapy: Anti-infectives (From admission, onward)    Start     Dose/Rate Route Frequency Ordered Stop    07/23/23 1700  Darunavir -Cobicistat-Emtricitabine -Tenofovir  Alafenamide (SYMTUZA ) 800-150-200-10 MG TABS 1 tablet        1 tablet Oral Daily with breakfast 07/23/23 1538     07/23/23 1630  dapsone  tablet 100 mg        100 mg Oral Daily 07/23/23 1538     07/21/23 1200  ertapenem  (INVANZ ) 1 g in sodium chloride  0.9 % 100 mL IVPB        1 g 200 mL/hr over 30 Minutes Intravenous Every 24 hours 07/21/23 1101     07/21/23 1000  metroNIDAZOLE  (FLAGYL ) IVPB 500 mg  Status:  Discontinued        500 mg 100 mL/hr over 60 Minutes Intravenous Every 12 hours 07/20/23 2202 07/21/23 1101   07/21/23 0800  linezolid  (ZYVOX ) IVPB 600 mg  Status:  Discontinued        600 mg 300 mL/hr over 60 Minutes Intravenous Every 12 hours 07/20/23 2202 07/23/23 1415   07/20/23 2300  ceFEPIme  (MAXIPIME ) 2 g in sodium chloride  0.9 % 100 mL IVPB  Status:  Discontinued        2 g 200 mL/hr over 30 Minutes Intravenous Every 8 hours 07/20/23 2205 07/21/23 1101   07/20/23 1445  linezolid  (ZYVOX ) IVPB 600 mg        600 mg 300 mL/hr over 60 Minutes Intravenous  Once 07/20/23 1437 07/20/23 1829   07/20/23 1430  aztreonam (AZACTAM) 2 g in sodium chloride  0.9 % 100 mL IVPB  Status:  Discontinued        2 g 200 mL/hr over 30 Minutes Intravenous  Once 07/20/23 1417 07/20/23 1419   07/20/23 1430  metroNIDAZOLE  (FLAGYL ) IVPB 500 mg        500 mg 100 mL/hr over 60 Minutes Intravenous  Once 07/20/23 1417 07/20/23 1727   07/20/23 1430  ceFEPIme  (MAXIPIME ) 2 g in sodium chloride  0.9 % 100 mL IVPB        2 g 200 mL/hr over 30 Minutes Intravenous  Once 07/20/23 1419 07/20/23 1557       Procedures:   Consultants:     Assessment and Plan: * UTI due to extended-spectrum beta lactamase (ESBL) producing Escherichia coli Since admission, in the setting of immunocompromised state due to Hiv/aids and non compliance to meds.  - continue with broad spectrum antibiotics. Currently on Ertapenem  , Linezolid  discontinued.  - urine cultures  growing  >100K  ESBL. Sensitive to ertapenem .  07-24-2023 remains on IV Invanz . Day #4. Awaiting ID f/u note. 07-25-2023 ID wants to treat with 14 days of IV Invanz . Today is Day #5 07-26-2023 per ID, unknown end date of IV Invanz . Continue IV Invanz  per ID service. 07-27-2023 continue with IV Invanz . Day #7. End date per ID service. 07-28-2023 Invanz  Day #8. End date of IV abx therapy per ID service. 07-29-2023 Invanz  Day #9. End date of IV abx therapy per ID service. Discussed with ID yesterday. They are awaiting MIC on oral alternative. MIC won't be back until next week. 07-30-2023 Invanz  Day #10. Awaiting MIC for oral alternative.    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  Per urology, this is a complex, finding and may require management at a tertiary care center with possible perineal excision of what is effectively a bladder diverticulum into the prostate  07-26-2023 he will need referral to tertiary care center with urologic surgery service that can manage this complex prostate/bladder anatomy after he has been treated for his ESBL E. Coli infection. 07-27-2023 urology to arrange outpatient referral to [facility] or tertiary care center.  07-28-2023 urology planning on outpatient referral to [facility]/tertiary care center. Pt is NOT going to be transferred during this hospitalization. 07-29-2023 confirmed with urology that transfer during this admission to tertiary care center is not possible. Urology service finding out if [facility] will even agree to see him as outpatient.  Sepsis due to Escherichia coli (E. coli) (HCC) 07-24-2023 present on admission. On admission, temp 101.7, RR 22, HR 104. UA shows significant hemoglobinuria, proteinuria, positive nitrite, moderate leuks, RBC >50, WBC >50, and many bacteria.  Sepsis has resolved with treatment of his E. Coli  UTI.  Febrile illness 07-24-2023 related to his untreated HIV, coronavirus infection and ESBL e. Coli infection. 07-25-2023 he is afebrile now.  Symptomatic HIV infection (HCC) 07-24-2023 seen by ID. Started on Sumtuza, dapsone  for PJP prophylaxis. 07-25-2023 continue with HIV meds and dapsone  for PJP prophylaxis.  07-26-2023 thru 07-30-2023 stable on Symtuza  and dapsone . No evidence for reconstitution syndrome.  Neutropenia with fever (HCC) 07-25-2023 related to his untreated HIV. 07-25-2022 fever has resolved.  AKI (acute kidney injury) (HCC) 07-24-2023 baseline SCR 1.3 from [facility] where labs.  AKI has resolved with IVF.  Coronavirus infection 07-25-2023 Not covid-19.  Continue with supportive care. On RA. 07-26-2023 awaiting ID recs on how long he needs to remain in droplet isolation. 01-02-205 await ID to remove droplet isolation precautions. 07-28-2023 stable. On RA. Pt has been in droplet isolation since admission. Defer to ID service to remove droplet isolation.  Pancytopenia (HCC) 07-25-2023  related to his untreated HIV.  Homelessness 07-24-2023 pt states his mother lives in [place]. TOC consulted for assistance in housing. Pt just returned from [place]  where he was staying with family friend.  07-25-2023 TOC is engaged and pt may ultimately need to go to homeless shelter at discharge.  History of syphilis 07-25-2023 pt with prior hx of syphilis.  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  His titer has decreased with prior treatment. RPR titer drop from 1:128 to 1:16 reflects adequate response to treatment back in March 2024.    Malnutrition of moderate degree 07-24-2023 see RD documentation from today. Moderate Malnutrition related to chronic illness as evidenced by mild fat depletion, moderate muscle depletion.    DVT prophylaxis: enoxaparin  (LOVENOX ) injection 40 mg Start: 07/20/23 2200    Code Status: Full Code Family Communication: no family at  bedside Disposition Plan: homeless shelter Reason for continuing need for hospitalization: remains on IV Abx.  Objective: Vitals:   07/29/23 1614 07/29/23 2239 07/30/23 0552 07/30/23 0727  BP: 103/65 (!) 99/56 95/61 98/67   Pulse: 84 98 75 75  Resp: 16 18 18 18   Temp: 98.8 F (37.1 C) 97.9 F (36.6 C) 99.1 F (37.3 C) 97.8 F (36.6 C)  TempSrc: Oral Oral Oral Oral  SpO2: 98% 96% 97% 98%  Weight:      Height:        Intake/Output Summary (Last 24 hours) at 07/30/2023 1026 Last data filed at 07/29/2023 2100 Gross per 24 hour  Intake 240 ml  Output --  Net 240 ml   Filed Weights   07/21/23 0900 07/21/23 1528  Weight: 84.6 kg 83.5 kg    Examination:  Physical Exam Vitals and nursing note reviewed.  Constitutional:      General: He is not in acute distress.    Appearance: He is not toxic-appearing or diaphoretic.  HENT:     Head: Normocephalic and atraumatic.     Nose: Nose normal.  Eyes:     General: No scleral icterus. Cardiovascular:     Rate and Rhythm: Normal rate and regular rhythm.  Pulmonary:     Effort: Pulmonary effort is normal.     Breath sounds: Normal breath sounds.  Abdominal:     General: Bowel sounds are normal.     Palpations: Abdomen is soft.  Skin:    General: Skin is warm and dry.     Capillary Refill: Capillary refill takes less than 2 seconds.  Neurological:     Mental Status: He is alert and oriented to person, place, and time.     Data Reviewed: I have personally reviewed following labs and imaging studies  CBC: Recent Labs  Lab 07/24/23 0614  HGB 9.5*   Basic Metabolic Panel: Recent Labs  Lab 07/24/23 0614  NA 137  K 3.7  CL 108  CO2 22  GLUCOSE 99  BUN 11  CREATININE 1.29*  CALCIUM  8.0*   GFR: Estimated Creatinine Clearance: 86.9 mL/min (A) (by C-G formula based on SCr of 1.29 mg/dL (H)). Liver Function Tests: Recent Labs  Lab 07/24/23 0614  AST 182*  ALT 82*  ALKPHOS 50  BILITOT 0.3  PROT 7.7  ALBUMIN  2.1*     Recent Results (from the past 240 hours)  Culture, blood (Routine x 2)     Status: None   Collection Time: 07/20/23  1:10 PM   Specimen: BLOOD LEFT HAND  Result Value Ref Range Status   Specimen Description BLOOD LEFT HAND  Final   Special Requests   Final    BOTTLES DRAWN AEROBIC AND ANAEROBIC Blood Culture results may not be optimal due to an inadequate volume of blood received in culture bottles   Culture   Final    NO GROWTH 5 DAYS Performed at [facility], [address] [place], [place] [address]    Report Status 07/25/2023 FINAL  Final  Urine Culture     Status: Abnormal   Collection Time: 07/20/23  3:13 PM   Specimen: Urine, Clean Catch  Result Value Ref Range Status   Specimen Description URINE, CLEAN CATCH  Final   Special Requests   Final    NONE Reflexed from [id number] Performed at [facility], [address] [place], [place] [address]    Culture (A)  Final    >=100,000 COLONIES/mL ESCHERICHIA COLI CORRECTED ON 12/30 AT 9188: PREVIOUSLY REPORTED AS >=100,000 COLONIES/mL GRAM NEGATIVE RODS Confirmed Extended Spectrum Beta-Lactamase Producer (ESBL).  In bloodstream infections from ESBL organisms, carbapenems are preferred over piperacillin /tazobactam. They are shown to have a lower risk of mortality.    Report Status 07/24/2023 FINAL  Final   Organism ID, Bacteria ESCHERICHIA COLI (A)  Final      Susceptibility   Escherichia coli - MIC*    AMPICILLIN >=32 RESISTANT Resistant     CEFAZOLIN  >=64 RESISTANT Resistant     CEFEPIME  16 RESISTANT Resistant     CEFTRIAXONE  >=64 RESISTANT Resistant     CIPROFLOXACIN  >=4 RESISTANT Resistant     GENTAMICIN  <=1 SENSITIVE Sensitive     IMIPENEM <=0.25 SENSITIVE Sensitive     NITROFURANTOIN  <=16 SENSITIVE Sensitive     TRIMETH /SULFA  >=320 RESISTANT Resistant     AMPICILLIN/SULBACTAM 4 SENSITIVE Sensitive     PIP/TAZO <=4 SENSITIVE Sensitive ug/mL    * >=100,000 COLONIES/mL ESCHERICHIA COLI CORRECTED ON 12/30 AT  9188: PREVIOUSLY REPORTED AS >=100,000 COLONIES/mL GRAM NEGATIVE RODS  Respiratory (~20 pathogens) panel by PCR     Status: Abnormal   Collection Time: 07/20/23  6:06 PM   Specimen: Nasopharyngeal Swab; Respiratory  Result Value Ref Range Status   Adenovirus NOT DETECTED NOT DETECTED Final   Coronavirus 229E NOT DETECTED NOT DETECTED Final    Comment: (NOTE) The Coronavirus on the Respiratory Panel, DOES NOT test for the novel  Coronavirus (2019 nCoV)    Coronavirus HKU1 NOT DETECTED NOT DETECTED Final   Coronavirus NL63 NOT DETECTED NOT DETECTED Final   Coronavirus OC43 DETECTED (A) NOT DETECTED Final   Metapneumovirus NOT DETECTED NOT DETECTED Final   Rhinovirus / Enterovirus NOT DETECTED NOT DETECTED Final   Influenza A NOT DETECTED NOT DETECTED Final   Influenza B NOT DETECTED NOT DETECTED Final   Parainfluenza Virus 1 NOT DETECTED NOT DETECTED Final   Parainfluenza Virus 2 NOT DETECTED NOT DETECTED Final   Parainfluenza Virus 3 NOT DETECTED NOT DETECTED Final   Parainfluenza Virus 4 NOT DETECTED NOT DETECTED Final   Respiratory Syncytial Virus NOT DETECTED NOT DETECTED Final   Bordetella pertussis NOT DETECTED NOT DETECTED Final   Bordetella Parapertussis NOT DETECTED NOT DETECTED Final   Chlamydophila pneumoniae NOT DETECTED NOT DETECTED Final   Mycoplasma pneumoniae NOT DETECTED NOT DETECTED Final    Comment: Performed at [facility], [address], [place], [place] [address]  Blastomyces Antigen     Status: None   Collection Time: 07/20/23  9:54 PM   Specimen: Blood  Result Value Ref Range Status   Blastomyces Antigen None  Detected None Detected ng/mL Final    Comment: (NOTE) Reference Interval: None Detected Reportable Range: 0.31 ng/mL - 20.00 ng/mL Results above 20.00 ng/mL are reported as 'Positive, Above the Limit of Quantification' This test was developed and its performance characteristics determined by [employer]. It has not been cleared or  approved by the FDA; however, FDA clearance or approval is not currently required for clinical use. The results are not intended to be used as the sole means for clinical diagnosis or patient decisions.    Interpretation Negative  Final   Specimen Type SERUM  Final    Comment: (NOTE) Performed At: [facility] [address] [place], [place] [id number] [place] Pac MD Ey:[id number]   MRSA Next Gen by PCR, Nasal     Status: None   Collection Time: 07/21/23 11:47 AM   Specimen: Nasal Mucosa; Nasal Swab  Result Value Ref Range Status   MRSA by PCR Next Gen NOT DETECTED NOT DETECTED Final    Comment: (NOTE) The GeneXpert MRSA Assay (FDA approved for NASAL specimens only), is one component of a comprehensive MRSA colonization surveillance program. It is not intended to diagnose MRSA infection nor to guide or monitor treatment for MRSA infections. Test performance is not FDA approved in patients less than 61 years old. Performed at [facility], [address], [place], [place] [address]   MIC (1 Drug)-     Status: None (Preliminary result)   Collection Time: 07/25/23  2:06 PM  Result Value Ref Range Status   Min Inhibitory Conc (1 Drug) REFERT  Final    Comment: (NOTE) Please refer to the following specimen for additional lab results. Please refer to spec # [id number] for test result. Almarie Schneider notified 07-28-2023 at 17:55 AM. Performed At: [facility] [address] [place], [place] [id number] Jennette Shorter MD Ey:[id number]    Source PENDING  Incomplete     Radiology Studies: No results found.  Scheduled Meds:  clotrimazole    Topical BID   cyanocobalamin   1,000 mcg Intramuscular Weekly   dapsone   100 mg Oral Daily   Darunavir -Cobicistat-Emtricitabine -Tenofovir  Alafenamide  1 tablet Oral Q breakfast   enoxaparin  (LOVENOX ) injection  40 mg Subcutaneous Q24H   feeding supplement  237 mL Oral BID BM   ferrous sulfate   325 mg Oral BID  WC   midodrine   5 mg Oral TID WC   multivitamin with minerals  1 tablet Oral Daily   pantoprazole   40 mg Oral Daily   senna-docusate  2 tablet Oral BID   sodium bicarbonate   1,300 mg Oral BID   tamsulosin   0.4 mg Oral QPC supper   Continuous Infusions:  ertapenem  1 g (07/30/23 0844)     LOS: 10 days   Time spent: 40 minutes  Camellia Door, DO  [facility]  07/30/2023, 10:26 AM

## 2023-07-31 DIAGNOSIS — B20 Human immunodeficiency virus [HIV] disease: Secondary | ICD-10-CM | POA: Diagnosis not present

## 2023-07-31 DIAGNOSIS — N429 Disorder of prostate, unspecified: Secondary | ICD-10-CM | POA: Diagnosis not present

## 2023-07-31 DIAGNOSIS — B342 Coronavirus infection, unspecified: Secondary | ICD-10-CM | POA: Diagnosis not present

## 2023-07-31 DIAGNOSIS — N390 Urinary tract infection, site not specified: Secondary | ICD-10-CM | POA: Diagnosis not present

## 2023-07-31 MED ORDER — ENSURE ENLIVE PO LIQD
237.0000 mL | Freq: Three times a day (TID) | ORAL | Status: DC
Start: 2023-07-31 — End: 2023-08-10
  Administered 2023-07-31 – 2023-08-10 (×27): 237 mL via ORAL
  Filled 2023-07-31: qty 237

## 2023-07-31 NOTE — Progress Notes (Addendum)
 Nutrition Follow-up  DOCUMENTATION CODES:   Non-severe (moderate) malnutrition in context of chronic illness  INTERVENTION:   - Continue Regular diet and encourage PO intake  - Ensure Enlive po TID, each supplement provides 350 kcal and 20 grams of protein. - x2 proteins with meals  - MVI with minerals daily    NUTRITION DIAGNOSIS:   Moderate Malnutrition related to chronic illness as evidenced by mild fat depletion, moderate muscle depletion.  - Still applicable   GOAL:   Patient will meet greater than or equal to 90% of their needs  - Progressing   MONITOR:   PO intake, Weight trends, Supplement acceptance  REASON FOR ASSESSMENT:   Consult Assessment of nutrition requirement/status  ASSESSMENT:   38 y.o Male with PMH of HIV/AIDS, medication noncompliance, syphilis, gonorrhea, monkeypox, ESBL E. coli UTI, HTN, pulmonary coccidiomycosis ,gastroenteritis, AIDS, remote substance abuse. Presents with dysuria, dark urine, fever, and chills. Admitted for sepsis secondary to UTI. Sepsis now resolved, ongoing treatment of UTI. Also found to prostate irregularity while admitted.  Pt having good app/PO intake with double proteins with meals. Consuming 78% of meals and having 2 Ensures per day. Had most of his breakfast this morning. Weight trending up since admission. No N/V noted.  Having BM.   Admit weight: 83.5 kg  Current weight: 90.1 kg    Date/Time Weight Weight in lbs BMI (Calculated)  07/31/23 1024 90.1 kg 198.63 lbs 26.21  07/21/23 1528 83.5 kg 184.08 lbs 24.29  07/21/23 0900 84.6 kg 186.51 lbs 24.61    Average Meal Intake: 12/30-1/6: 78% intake x 8 recorded meals  Nutritionally Relevant Medications: Scheduled Meds:  cyanocobalamin   1,000 mcg Intramuscular Weekly   ferrous sulfate   325 mg Oral BID WC   midodrine   5 mg Oral TID WC   multivitamin with minerals  1 tablet Oral Daily   senna-docusate  2 tablet Oral BID   sodium bicarbonate   1,300 mg Oral BID    Continuous Infusions:  ertapenem  1 g (07/31/23 1100)   PRN Meds:.acetaminophen , ondansetron  (ZOFRAN ) IV  Labs Reviewed: No recent labs. Last BMP 12/27, CMP 12/30. Vitamin B12 144  Diet Order:   Diet Order             Diet regular Fluid consistency: Thin  Diet effective now                   EDUCATION NEEDS:   Education needs have been addressed  Skin:  Skin Assessment: Skin Integrity Issues: Skin Integrity Issues:: Other (Comment) Other: Dry flaky rash on R leg  Last BM:  07/31/23  Height:   Ht Readings from Last 1 Encounters:  07/21/23 6' 1 (1.854 m)    Weight:   Wt Readings from Last 1 Encounters:  07/31/23 90.1 kg    Ideal Body Weight:  83.64 kg  BMI:  Body mass index is 26.21 kg/m.  Estimated Nutritional Needs:   Kcal:  2300-2500 kcal  Protein:  100-110 gm  Fluid:  >2 L   Olivia Kenning, RD Registered Dietitian  See Amion for more information

## 2023-07-31 NOTE — Plan of Care (Signed)
  Problem: Clinical Measurements: Goal: Ability to maintain clinical measurements within normal limits will improve Outcome: Progressing Goal: Will remain free from infection Outcome: Progressing Goal: Diagnostic test results will improve Outcome: Progressing   Problem: Activity: Goal: Risk for activity intolerance will decrease Outcome: Progressing   Problem: Nutrition: Goal: Adequate nutrition will be maintained Outcome: Progressing   Problem: Elimination: Goal: Will not experience complications related to bowel motility Outcome: Progressing Goal: Will not experience complications related to urinary retention Outcome: Progressing

## 2023-07-31 NOTE — Progress Notes (Signed)
 PROGRESS NOTE    MIKAEL SKODA  FMW:[id number] DOB: 1985/09/12 DOA: 07/20/2023 PCP: No primary care provider on file.  Subjective: Pt seen and examined. Stable. Afebrile. No complaints. Afebrile. On RA. Pt thinks his rash on right leg is getting better.   Hospital Course: HPI: KEMONI ORTEGA is a 38 y.o. male with medical history significant of HIV/AIDS, medication noncompliance, remote substance abuse, pulmonary coccidiomycosis, syphilis, gonorrhea, monkeypox, ESBL E. coli UTI and HTN who presents to the ED for evaluation of dysuria, fevers and chills. Patient reports he is followed by the [facility] here in [place] but he moved to [place]  and has been out of his HIV medications since June/July. He has had a history of UTIs and prostatitis and was recently admitted for sepsis due to UTI last month in [place] . He came to [facility] for the holidays and has continued to have urinary symptoms including difficulty urinating, pain with urination and dark urine. Over the last 3-4 days, he has had intermittent fevers and sweats. He also endorsed mild dyspnea on exertion and fatigue but denies any cough, headaches, dizziness, penile discharge, abdominal pain, shortness of breath or chest pain. He is sexually active and his last sexual encounter was on unprotected sexual intercourse with a male about a month ago. He reports prior history of substance abuse but has been abstinent for years.   Significant Events: Admitted 07/20/2023 for UTI   Significant Labs: sodium 129, K+ 3.9, bicarb 19, creatinine 1.76, albumin  2.7, WBC 1.9, Hgb 10.7, platelet 130, PT/INR 15.0/1.2, glucose 86, lactic acid 0.6, UA shows significant hemoglobinuria, proteinuria, positive nitrite, moderate leuks, RBC >50, WBC >50, and many bacteria  Urine Cx with ESBL E. coli  Significant Imaging Studies: Chest x-ray with no active cardiopulmonary disease   Antibiotic Therapy: Anti-infectives (From admission, onward)    Start      Dose/Rate Route Frequency Ordered Stop   07/23/23 1700  Darunavir -Cobicistat-Emtricitabine -Tenofovir  Alafenamide (SYMTUZA ) 800-150-200-10 MG TABS 1 tablet        1 tablet Oral Daily with breakfast 07/23/23 1538     07/23/23 1630  dapsone  tablet 100 mg        100 mg Oral Daily 07/23/23 1538     07/21/23 1200  ertapenem  (INVANZ ) 1 g in sodium chloride  0.9 % 100 mL IVPB        1 g 200 mL/hr over 30 Minutes Intravenous Every 24 hours 07/21/23 1101     07/21/23 1000  metroNIDAZOLE  (FLAGYL ) IVPB 500 mg  Status:  Discontinued        500 mg 100 mL/hr over 60 Minutes Intravenous Every 12 hours 07/20/23 2202 07/21/23 1101   07/21/23 0800  linezolid  (ZYVOX ) IVPB 600 mg  Status:  Discontinued        600 mg 300 mL/hr over 60 Minutes Intravenous Every 12 hours 07/20/23 2202 07/23/23 1415   07/20/23 2300  ceFEPIme  (MAXIPIME ) 2 g in sodium chloride  0.9 % 100 mL IVPB  Status:  Discontinued        2 g 200 mL/hr over 30 Minutes Intravenous Every 8 hours 07/20/23 2205 07/21/23 1101   07/20/23 1445  linezolid  (ZYVOX ) IVPB 600 mg        600 mg 300 mL/hr over 60 Minutes Intravenous  Once 07/20/23 1437 07/20/23 1829   07/20/23 1430  aztreonam (AZACTAM) 2 g in sodium chloride  0.9 % 100 mL IVPB  Status:  Discontinued        2 g 200 mL/hr over 30 Minutes Intravenous  Once 07/20/23 1417 07/20/23 1419   07/20/23 1430  metroNIDAZOLE  (FLAGYL ) IVPB 500 mg        500 mg 100 mL/hr over 60 Minutes Intravenous  Once 07/20/23 1417 07/20/23 1727   07/20/23 1430  ceFEPIme  (MAXIPIME ) 2 g in sodium chloride  0.9 % 100 mL IVPB        2 g 200 mL/hr over 30 Minutes Intravenous  Once 07/20/23 1419 07/20/23 1557       Procedures:   Consultants:     Assessment and Plan: * UTI due to extended-spectrum beta lactamase (ESBL) producing Escherichia coli Since admission, in the setting of immunocompromised state due to Hiv/aids and non compliance to meds.  - continue with broad spectrum antibiotics. Currently on Ertapenem  ,  Linezolid  discontinued.  - urine cultures growing  >100K  ESBL. Sensitive to ertapenem .  07-24-2023 remains on IV Invanz . Day #4. Awaiting ID f/u note. 07-25-2023 ID wants to treat with 14 days of IV Invanz . Today is Day #5 07-26-2023 per ID, unknown end date of IV Invanz . Continue IV Invanz  per ID service. 07-27-2023 continue with IV Invanz . Day #7. End date per ID service. 07-28-2023 Invanz  Day #8. End date of IV abx therapy per ID service. 07-29-2023 Invanz  Day #9. End date of IV abx therapy per ID service. Discussed with ID yesterday. They are awaiting MIC on oral alternative. MIC won't be back until next week. 07-30-2023 Invanz  Day #10. Awaiting MIC for oral alternative.  07-31-2023 Invanz  Day #11. Awaiting MIC for oral alternative.  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  Per urology, this is a complex, finding and may require management at a tertiary care center with possible perineal excision of what is effectively a bladder diverticulum into the prostate 07-26-2023 he will need referral to tertiary care center with urologic surgery service that can manage this complex prostate/bladder anatomy after he has been treated for his ESBL E. Coli infection. 07-27-2023 urology to arrange outpatient referral to [facility] or tertiary care center. 07-28-2023 urology planning on outpatient referral to [facility]/tertiary care center. Pt is NOT going to be transferred during this hospitalization. 07-29-2023 confirmed with urology that transfer during this admission to tertiary care center is not possible. Urology service finding out if [facility] will even agree to see him as outpatient.  07-31-2023 will need outpatient referral to tertiary care center for urologic consultation.  Sepsis due to Escherichia coli (E. coli) (HCC) 07-24-2023 present on admission. On admission, temp  101.7, RR 22, HR 104. UA shows significant hemoglobinuria, proteinuria, positive nitrite, moderate leuks, RBC >50, WBC >50, and many bacteria.  Sepsis has resolved with treatment of his E. Coli UTI.  Febrile illness 07-24-2023 related to his untreated HIV, coronavirus infection and ESBL e. Coli infection. 07-25-2023 he is afebrile now.  Symptomatic HIV infection (HCC) 07-24-2023 seen by ID. Started on Sumtuza, dapsone  for PJP prophylaxis. 07-25-2023 continue with HIV meds and dapsone  for PJP prophylaxis.  07-26-2023 thru 07-31-2023 stable on Symtuza  and dapsone . No evidence for reconstitution syndrome.  Neutropenia with fever (HCC) 07-25-2023 related to his untreated HIV. 07-25-2022 fever has resolved.  AKI (acute kidney injury) (HCC) 07-24-2023 baseline SCR 1.3 from [facility] where labs.  AKI has resolved with IVF.  Coronavirus infection 07-25-2023 Not covid-19.  Continue with supportive care. On RA. 07-26-2023 awaiting ID recs on how long he needs to remain in droplet isolation. 01-02-205 await ID  to remove droplet isolation precautions. 07-28-2023 stable. On RA. Pt has been in droplet isolation since admission. Defer to ID service to remove droplet isolation.  07-31-2023 Pt has been in droplet isolation since admission. Defer to ID service to remove droplet isolation. On RA.  Pancytopenia (HCC) 07-25-2023 related to his untreated HIV.  Homelessness 07-24-2023 pt states his mother lives in [place]. TOC consulted for assistance in housing. Pt just returned from [place]  where he was staying with family friend.  07-25-2023 TOC is engaged and pt may ultimately need to go to homeless shelter at discharge.  History of syphilis 07-25-2023 pt with prior hx of syphilis.  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  His titer has decreased with prior treatment. RPR titer drop from 1:128 to 1:16 reflects adequate response to treatment back in March 2024.    Malnutrition of moderate  degree 07-24-2023 see RD documentation from today. Moderate Malnutrition related to chronic illness as evidenced by mild fat depletion, moderate muscle depletion.    DVT prophylaxis: enoxaparin  (LOVENOX ) injection 40 mg Start: 07/20/23 2200    Code Status: Full Code Family Communication: no family at bedside Disposition Plan: homeless shelter Reason for continuing need for hospitalization: remains on IV Abx.  Objective: Vitals:   07/30/23 2315 07/31/23 0624 07/31/23 0900 07/31/23 1024  BP: 101/61 99/63 (!) 101/57   Pulse: 85 69 76   Resp: 16 16 18    Temp: 98.2 F (36.8 C) 97.9 F (36.6 C) 97.9 F (36.6 C)   TempSrc: Oral Oral Oral   SpO2: 96% 98% 97%   Weight:    90.1 kg  Height:       No intake or output data in the 24 hours ending 07/31/23 1054 Filed Weights   07/21/23 0900 07/21/23 1528 07/31/23 1024  Weight: 84.6 kg 83.5 kg 90.1 kg    Examination:  Physical Exam Vitals and nursing note reviewed.  Constitutional:      General: He is not in acute distress.    Appearance: He is not toxic-appearing or diaphoretic.  HENT:     Head: Normocephalic and atraumatic.     Nose: Nose normal.  Eyes:     General: No scleral icterus. Cardiovascular:     Rate and Rhythm: Normal rate and regular rhythm.  Pulmonary:     Effort: Pulmonary effort is normal.  Abdominal:     General: Abdomen is flat. Bowel sounds are normal.     Palpations: Abdomen is soft.  Skin:    General: Skin is warm and dry.     Capillary Refill: Capillary refill takes less than 2 seconds.     Comments: Dry flaky, non-erythematous rash on right lower leg is stable.  Neurological:     Mental Status: He is alert and oriented to person, place, and time.     Data Reviewed: I have personally reviewed following labs and imaging studies  Recent Results (from the past 240 hours)  MRSA Next Gen by PCR, Nasal     Status: None   Collection Time: 07/21/23 11:47 AM   Specimen: Nasal Mucosa; Nasal Swab   Result Value Ref Range Status   MRSA by PCR Next Gen NOT DETECTED NOT DETECTED Final    Comment: (NOTE) The GeneXpert MRSA Assay (FDA approved for NASAL specimens only), is one component of a comprehensive MRSA colonization surveillance program. It is not intended to diagnose MRSA infection nor to guide or monitor treatment for MRSA infections. Test performance is not FDA approved  in patients less than 14 years old. Performed at [facility], [address] [place], [place] [address]   MIC (1 Drug)-     Status: None (Preliminary result)   Collection Time: 07/25/23  2:06 PM  Result Value Ref Range Status   Min Inhibitory Conc (1 Drug) REFERT  Final    Comment: (NOTE) Please refer to the following specimen for additional lab results. Please refer to spec # [id number] for test result. Almarie Schneider notified 07-28-2023 at 17:55 AM. Performed At: [facility] [address] [place], [place] [id number] Jennette Shorter MD Ey:[phone]    Source PENDING  Incomplete     Radiology Studies: No results found.  Scheduled Meds:  clotrimazole    Topical BID   cyanocobalamin   1,000 mcg Intramuscular Weekly   dapsone   100 mg Oral Daily   Darunavir -Cobicistat-Emtricitabine -Tenofovir  Alafenamide  1 tablet Oral Q breakfast   enoxaparin  (LOVENOX ) injection  40 mg Subcutaneous Q24H   feeding supplement  237 mL Oral TID BM   ferrous sulfate   325 mg Oral BID WC   midodrine   5 mg Oral TID WC   multivitamin with minerals  1 tablet Oral Daily   pantoprazole   40 mg Oral Daily   senna-docusate  2 tablet Oral BID   sodium bicarbonate   1,300 mg Oral BID   tamsulosin   0.4 mg Oral QPC supper   Continuous Infusions:  ertapenem  Stopped (07/31/23 1049)     LOS: 11 days   Time spent: 35 minutes  Camellia Door, DO  [facility]  07/31/2023, 10:54 AM

## 2023-08-01 DIAGNOSIS — B342 Coronavirus infection, unspecified: Secondary | ICD-10-CM | POA: Diagnosis not present

## 2023-08-01 DIAGNOSIS — N390 Urinary tract infection, site not specified: Secondary | ICD-10-CM | POA: Diagnosis not present

## 2023-08-01 DIAGNOSIS — N429 Disorder of prostate, unspecified: Secondary | ICD-10-CM | POA: Diagnosis not present

## 2023-08-01 DIAGNOSIS — B20 Human immunodeficiency virus [HIV] disease: Secondary | ICD-10-CM | POA: Diagnosis not present

## 2023-08-01 NOTE — Progress Notes (Signed)
 PROGRESS NOTE    Jacob Gutierrez  FMW:[id number] DOB: [birth year] DOA: 07/20/2023 PCP: No primary care provider on file.  Subjective: Pt seen and examined. Stable. Afebrile. No complaints.  Asked ID about MIC on oral abx(omadacycline ). Was told that it takes about 2 weeks for MIC to come back.  Pt states he is performing daily hygiene activities including showering and brushing his teeth and ambulating in his room.    Hospital Course: HPI: Jacob Gutierrez is a 38 y.o. male with medical history significant of HIV/AIDS, medication noncompliance, remote substance abuse, pulmonary coccidiomycosis, syphilis, gonorrhea, monkeypox, ESBL E. coli UTI and HTN who presents to the ED for evaluation of dysuria, fevers and chills. Patient reports he is followed by the [facility] here in [place] but he moved to [place]  and has been out of his HIV medications since June/July. He has had a history of UTIs and prostatitis and was recently admitted for sepsis due to UTI last month in [place] . He came to [facility] for the holidays and has continued to have urinary symptoms including difficulty urinating, pain with urination and dark urine. Over the last 3-4 days, he has had intermittent fevers and sweats. He also endorsed mild dyspnea on exertion and fatigue but denies any cough, headaches, dizziness, penile discharge, abdominal pain, shortness of breath or chest pain. He is sexually active and his last sexual encounter was on unprotected sexual intercourse with a male about a month ago. He reports prior history of substance abuse but has been abstinent for years.   Significant Events: Admitted 07/20/2023 for UTI   Significant Labs: sodium 129, K+ 3.9, bicarb 19, creatinine 1.76, albumin  2.7, WBC 1.9, Hgb 10.7, platelet 130, PT/INR 15.0/1.2, glucose 86, lactic acid 0.6, UA shows significant hemoglobinuria, proteinuria, positive nitrite, moderate leuks, RBC >50, WBC >50, and many bacteria  Urine Cx with ESBL E.  coli  Significant Imaging Studies: Chest x-ray with no active cardiopulmonary disease   Antibiotic Therapy: Anti-infectives (From admission, onward)    Start     Dose/Rate Route Frequency Ordered Stop   07/23/23 1700  Darunavir -Cobicistat-Emtricitabine -Tenofovir  Alafenamide (SYMTUZA ) 800-150-200-10 MG TABS 1 tablet        1 tablet Oral Daily with breakfast 07/23/23 1538     07/23/23 1630  dapsone  tablet 100 mg        100 mg Oral Daily 07/23/23 1538     07/21/23 1200  ertapenem  (INVANZ ) 1 g in sodium chloride  0.9 % 100 mL IVPB        1 g 200 mL/hr over 30 Minutes Intravenous Every 24 hours 07/21/23 1101     07/21/23 1000  metroNIDAZOLE  (FLAGYL ) IVPB 500 mg  Status:  Discontinued        500 mg 100 mL/hr over 60 Minutes Intravenous Every 12 hours 07/20/23 2202 07/21/23 1101   07/21/23 0800  linezolid  (ZYVOX ) IVPB 600 mg  Status:  Discontinued        600 mg 300 mL/hr over 60 Minutes Intravenous Every 12 hours 07/20/23 2202 07/23/23 1415   07/20/23 2300  ceFEPIme  (MAXIPIME ) 2 g in sodium chloride  0.9 % 100 mL IVPB  Status:  Discontinued        2 g 200 mL/hr over 30 Minutes Intravenous Every 8 hours 07/20/23 2205 07/21/23 1101   07/20/23 1445  linezolid  (ZYVOX ) IVPB 600 mg        600 mg 300 mL/hr over 60 Minutes Intravenous  Once 07/20/23 1437 07/20/23 1829   07/20/23 1430  aztreonam (AZACTAM) 2  g in sodium chloride  0.9 % 100 mL IVPB  Status:  Discontinued        2 g 200 mL/hr over 30 Minutes Intravenous  Once 07/20/23 1417 07/20/23 1419   07/20/23 1430  metroNIDAZOLE  (FLAGYL ) IVPB 500 mg        500 mg 100 mL/hr over 60 Minutes Intravenous  Once 07/20/23 1417 07/20/23 1727   07/20/23 1430  ceFEPIme  (MAXIPIME ) 2 g in sodium chloride  0.9 % 100 mL IVPB        2 g 200 mL/hr over 30 Minutes Intravenous  Once 07/20/23 1419 07/20/23 1557       Procedures:   Consultants:     Assessment and Plan: * UTI due to extended-spectrum beta lactamase (ESBL) producing Escherichia coli Since  admission, in the setting of immunocompromised state due to Hiv/aids and non compliance to meds.  - continue with broad spectrum antibiotics. Currently on Ertapenem  , Linezolid  discontinued.  - urine cultures growing  >100K  ESBL. Sensitive to ertapenem .  07-24-2023 remains on IV Invanz . Day #4. Awaiting ID f/u note. 07-25-2023 ID wants to treat with 14 days of IV Invanz . Today is Day #5 07-26-2023 per ID, unknown end date of IV Invanz . Continue IV Invanz  per ID service. 07-27-2023 continue with IV Invanz . Day #7. End date per ID service. 07-28-2023 Invanz  Day #8. End date of IV abx therapy per ID service. 07-29-2023 Invanz  Day #9. End date of IV abx therapy per ID service. Discussed with ID yesterday. They are awaiting MIC on oral alternative. MIC won't be back until next week. 07-30-2023 Invanz  Day #10. Awaiting MIC for oral alternative. 07-31-2023 Invanz  Day #11. Awaiting MIC for oral alternative.  08-01-2023 Invanz  Day #12. 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  Per urology, this is a complex, finding and may require management at a tertiary care center with possible perineal excision of what is effectively a bladder diverticulum into the prostate 07-26-2023 he will need referral to tertiary care center with urologic surgery service that can manage this complex prostate/bladder anatomy after he has been treated for his ESBL E. Coli infection. 07-27-2023 urology to arrange outpatient referral to [facility] or tertiary care center. 07-28-2023 urology planning on outpatient referral to [facility]/tertiary care center. Pt is NOT going to be transferred during this hospitalization. 07-29-2023 confirmed with urology that transfer during this admission to tertiary care center is not possible. Urology service finding out if [facility]  will even agree to see him as outpatient.  07-31-2023 will need outpatient referral to tertiary care center for urologic consultation.  Sepsis due to Escherichia coli (E. coli) (HCC) 07-24-2023 present on admission. On admission, temp 101.7, RR 22, HR 104. UA shows significant hemoglobinuria, proteinuria, positive nitrite, moderate leuks, RBC >50, WBC >50, and many bacteria.  Sepsis has resolved with treatment of his E. Coli UTI.  Febrile illness 07-24-2023 related to his untreated HIV, coronavirus infection and ESBL e. Coli infection. 07-25-2023 he is afebrile now.  Symptomatic HIV infection (HCC) 07-24-2023 seen by ID. Started on Sumtuza, dapsone  for PJP prophylaxis. 07-25-2023 continue with HIV meds and dapsone  for PJP prophylaxis.  07-26-2023 thru 07-31-2023 stable on Symtuza  and dapsone . No evidence for reconstitution syndrome.  Neutropenia with fever (HCC) 07-25-2023 related to his untreated HIV. 07-25-2022 fever has resolved.  AKI (acute kidney injury) (HCC) 07-24-2023 baseline SCR 1.3 from [facility]  where labs.  AKI has resolved with IVF.  Coronavirus infection 07-25-2023 Not covid-19.  Continue with supportive care. On RA. 07-26-2023 awaiting ID recs on how long he needs to remain in droplet isolation. 01-02-205 await ID to remove droplet isolation precautions. 07-28-2023 stable. On RA. Pt has been in droplet isolation since admission. Defer to ID service to remove droplet isolation.  07-31-2023 Pt has been in droplet isolation since admission. Defer to ID service to remove droplet isolation. On RA.  Pancytopenia (HCC) 07-25-2023 related to his untreated HIV.  Homelessness 07-24-2023 pt states his mother lives in [place]. TOC consulted for assistance in housing. Pt just returned from [place]  where he was staying with family friend.  07-25-2023 TOC is engaged and pt may ultimately need to go to homeless shelter at discharge.  History of syphilis 07-25-2023 pt with  prior hx of syphilis.  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  His titer has decreased with prior treatment. RPR titer drop from 1:128 to 1:16 reflects adequate response to treatment back in March 2024.    Malnutrition of moderate degree 07-24-2023 see RD documentation from today. Moderate Malnutrition related to chronic illness as evidenced by mild fat depletion, moderate muscle depletion.    DVT prophylaxis: enoxaparin  (LOVENOX ) injection 40 mg Start: 07/20/23 2200    Code Status: Full Code Family Communication: no family at bedside Disposition Plan: homeless shelter  Reason for continuing need for hospitalization: remains on IV Abx.  Objective: Vitals:   07/31/23 1024 07/31/23 2037 08/01/23 0507 08/01/23 0825  BP:  99/60 102/66 95/69  Pulse:  82 75 78  Resp:   17 16  Temp:  98.1 F (36.7 C) 98.1 F (36.7 C) 97.7 F (36.5 C)  TempSrc:  Oral Oral Oral  SpO2:  97% 95% 97%  Weight: 90.1 kg     Height:        Intake/Output Summary (Last 24 hours) at 08/01/2023 1445 Last data filed at 08/01/2023 0700 Gross per 24 hour  Intake 340 ml  Output --  Net 340 ml   Filed Weights   07/21/23 0900 07/21/23 1528 07/31/23 1024  Weight: 84.6 kg 83.5 kg 90.1 kg    Examination:  Physical Exam Vitals and nursing note reviewed.  Constitutional:      General: He is not in acute distress.    Appearance: He is not toxic-appearing or diaphoretic.  HENT:     Head: Normocephalic and atraumatic.     Nose: Nose normal.  Eyes:     General: No scleral icterus. Cardiovascular:     Rate and Rhythm: Normal rate and regular rhythm.  Pulmonary:     Effort: Pulmonary effort is normal. No respiratory distress.  Abdominal:     General: Abdomen is flat. Bowel sounds are normal.  Skin:    General: Skin is warm and dry.     Capillary Refill: Capillary refill takes less than 2 seconds.  Neurological:     Mental Status: He is alert and oriented to person, place, and time.    Recent  Results (from the past 240 hours)  MIC (1 Drug)-     Status: None (Preliminary result)   Collection Time: 07/25/23  2:06 PM  Result Value Ref Range Status   Min Inhibitory Conc (1 Drug) REFERT  Final    Comment: (NOTE) Please refer to the following specimen for additional lab results. Please refer to spec # [id number] for test result. Jacob Gutierrez notified 07-28-2023 at 17:55 AM. Performed  At: [facility] [address] [place], [place] [id number] Nagendra Sanjai MD Ey:[id number]    Source PENDING  Incomplete    Scheduled Meds:  clotrimazole    Topical BID   cyanocobalamin   1,000 mcg Intramuscular Weekly   dapsone   100 mg Oral Daily   Darunavir -Cobicistat-Emtricitabine -Tenofovir  Alafenamide  1 tablet Oral Q breakfast   enoxaparin  (LOVENOX ) injection  40 mg Subcutaneous Q24H   feeding supplement  237 mL Oral TID BM   ferrous sulfate   325 mg Oral BID WC   midodrine   5 mg Oral TID WC   multivitamin with minerals  1 tablet Oral Daily   pantoprazole   40 mg Oral Daily   senna-docusate  2 tablet Oral BID   sodium bicarbonate   1,300 mg Oral BID   tamsulosin   0.4 mg Oral QPC supper   Continuous Infusions:  ertapenem  1 g (08/01/23 1042)     LOS: 12 days   Time spent: 40 minutes  Camellia Door, DO  [facility]  08/01/2023, 2:45 PM

## 2023-08-01 NOTE — Progress Notes (Signed)
   Discussed with ID. Ok to stop droplet isolation for coronavirus(not Covid-19).  Carollee Herter, DO [facility]

## 2023-08-01 NOTE — Progress Notes (Signed)
 Changed RFA PIV dressing

## 2023-08-01 NOTE — Plan of Care (Signed)
  Problem: Activity: Goal: Risk for activity intolerance will decrease Outcome: Progressing   Problem: Nutrition: Goal: Adequate nutrition will be maintained Outcome: Progressing   Problem: Coping: Goal: Level of anxiety will decrease Outcome: Progressing   Problem: Elimination: Goal: Will not experience complications related to bowel motility Outcome: Progressing   Problem: Pain Management: Goal: General experience of comfort will improve Outcome: Progressing   Problem: Safety: Goal: Ability to remain free from injury will improve Outcome: Progressing

## 2023-08-01 NOTE — Plan of Care (Signed)
  Problem: Clinical Measurements: Goal: Respiratory complications will improve Outcome: Progressing Goal: Cardiovascular complication will be avoided Outcome: Progressing   Problem: Nutrition: Goal: Adequate nutrition will be maintained Outcome: Progressing   Problem: Coping: Goal: Level of anxiety will decrease Outcome: Progressing   Problem: Pain Management: Goal: General experience of comfort will improve Outcome: Progressing   Problem: Safety: Goal: Ability to remain free from injury will improve Outcome: Progressing   Problem: Skin Integrity: Goal: Risk for impaired skin integrity will decrease Outcome: Progressing

## 2023-08-02 DIAGNOSIS — N429 Disorder of prostate, unspecified: Secondary | ICD-10-CM | POA: Diagnosis not present

## 2023-08-02 DIAGNOSIS — N390 Urinary tract infection, site not specified: Secondary | ICD-10-CM | POA: Diagnosis not present

## 2023-08-02 DIAGNOSIS — B20 Human immunodeficiency virus [HIV] disease: Secondary | ICD-10-CM | POA: Diagnosis not present

## 2023-08-02 DIAGNOSIS — B342 Coronavirus infection, unspecified: Secondary | ICD-10-CM | POA: Diagnosis not present

## 2023-08-02 NOTE — Plan of Care (Signed)
  Problem: Education: Goal: Knowledge of General Education information will improve Description: Including pain rating scale, medication(s)/side effects and non-pharmacologic comfort measures Outcome: Progressing   Problem: Health Behavior/Discharge Planning: Goal: Ability to manage health-related needs will improve Outcome: Progressing   Problem: Clinical Measurements: Goal: Respiratory complications will improve Outcome: Progressing Goal: Cardiovascular complication will be avoided Outcome: Progressing   Problem: Activity: Goal: Risk for activity intolerance will decrease Outcome: Progressing   Problem: Elimination: Goal: Will not experience complications related to bowel motility Outcome: Progressing Goal: Will not experience complications related to urinary retention Outcome: Progressing   Problem: Nutrition: Goal: Adequate nutrition will be maintained Outcome: Progressing   Problem: Pain Management: Goal: General experience of comfort will improve Outcome: Progressing   Problem: Skin Integrity: Goal: Risk for impaired skin integrity will decrease Outcome: Progressing

## 2023-08-02 NOTE — Plan of Care (Signed)
 Plan of care is reviewed. Pt has been progressing. He is stable hemodynamically, afebrile, no acute distress noted. No complaints overnight.   Problem: Education: Goal: Knowledge of General Education information will improve Description: Including pain rating scale, medication(s)/side effects and non-pharmacologic comfort measures Outcome: Progressing   Problem: Health Behavior/Discharge Planning: Goal: Ability to manage health-related needs will improve Outcome: Progressing   Problem: Clinical Measurements: Goal: Ability to maintain clinical measurements within normal limits will improve Outcome: Progressing Goal: Will remain free from infection Outcome: Progressing Goal: Diagnostic test results will improve Outcome: Progressing Goal: Respiratory complications will improve Outcome: Progressing Goal: Cardiovascular complication will be avoided Outcome: Progressing   Problem: Activity: Goal: Risk for activity intolerance will decrease Outcome: Progressing   Problem: Nutrition: Goal: Adequate nutrition will be maintained Outcome: Progressing   Problem: Elimination: Goal: Will not experience complications related to bowel motility Outcome: Progressing Goal: Will not experience complications related to urinary retention Outcome: Progressing   Wendi Dash, RN

## 2023-08-02 NOTE — Progress Notes (Signed)
 PROGRESS NOTE    Jacob Gutierrez  FMW:[id number] DOB: [birth year] DOA: 07/20/2023 PCP: No primary care provider on file.  Subjective: Pt seen and examined. Stable. Afebrile. No complaints. Droplet precautions lifted yesterday Pt states he has been walking in the floor hallways. Wants to know if he will be out of the hospital for his birthday(Nov 27, 1985).  Discussed that MIC for omadacycline  have been sent and awaiting results from outside labs. IV ABX duration to be determined by ID service.   Hospital Course: HPI: Jacob Gutierrez is a 38 y.o. male with medical history significant of HIV/AIDS, medication noncompliance, remote substance abuse, pulmonary coccidiomycosis, syphilis, gonorrhea, monkeypox, ESBL E. coli UTI and HTN who presents to the ED for evaluation of dysuria, fevers and chills. Patient reports he is followed by the [facility] here in [place] but he moved to [place]  and has been out of his HIV medications since June/July. He has had a history of UTIs and prostatitis and was recently admitted for sepsis due to UTI last month in [place] . He came to [facility] for the holidays and has continued to have urinary symptoms including difficulty urinating, pain with urination and dark urine. Over the last 3-4 days, he has had intermittent fevers and sweats. He also endorsed mild dyspnea on exertion and fatigue but denies any cough, headaches, dizziness, penile discharge, abdominal pain, shortness of breath or chest pain. He is sexually active and his last sexual encounter was on unprotected sexual intercourse with a male about a month ago. He reports prior history of substance abuse but has been abstinent for years.   Significant Events: Admitted 07/20/2023 for UTI   Significant Labs: sodium 129, K+ 3.9, bicarb 19, creatinine 1.76, albumin  2.7, WBC 1.9, Hgb 10.7, platelet 130, PT/INR 15.0/1.2, glucose 86, lactic acid 0.6, UA shows significant hemoglobinuria, proteinuria, positive nitrite,  moderate leuks, RBC >50, WBC >50, and many bacteria  Urine Cx with ESBL E. coli  Significant Imaging Studies: Chest x-ray with no active cardiopulmonary disease   Antibiotic Therapy: Anti-infectives (From admission, onward)    Start     Dose/Rate Route Frequency Ordered Stop   07/23/23 1700  Darunavir -Cobicistat-Emtricitabine -Tenofovir  Alafenamide (SYMTUZA ) 800-150-200-10 MG TABS 1 tablet        1 tablet Oral Daily with breakfast 07/23/23 1538     07/23/23 1630  dapsone  tablet 100 mg        100 mg Oral Daily 07/23/23 1538     07/21/23 1200  ertapenem  (INVANZ ) 1 g in sodium chloride  0.9 % 100 mL IVPB        1 g 200 mL/hr over 30 Minutes Intravenous Every 24 hours 07/21/23 1101     07/21/23 1000  metroNIDAZOLE  (FLAGYL ) IVPB 500 mg  Status:  Discontinued        500 mg 100 mL/hr over 60 Minutes Intravenous Every 12 hours 07/20/23 2202 07/21/23 1101   07/21/23 0800  linezolid  (ZYVOX ) IVPB 600 mg  Status:  Discontinued        600 mg 300 mL/hr over 60 Minutes Intravenous Every 12 hours 07/20/23 2202 07/23/23 1415   07/20/23 2300  ceFEPIme  (MAXIPIME ) 2 g in sodium chloride  0.9 % 100 mL IVPB  Status:  Discontinued        2 g 200 mL/hr over 30 Minutes Intravenous Every 8 hours 07/20/23 2205 07/21/23 1101   07/20/23 1445  linezolid  (ZYVOX ) IVPB 600 mg        600 mg 300 mL/hr over 60 Minutes Intravenous  Once 07/20/23 1437  07/20/23 1829   07/20/23 1430  aztreonam (AZACTAM) 2 g in sodium chloride  0.9 % 100 mL IVPB  Status:  Discontinued        2 g 200 mL/hr over 30 Minutes Intravenous  Once 07/20/23 1417 07/20/23 1419   07/20/23 1430  metroNIDAZOLE  (FLAGYL ) IVPB 500 mg        500 mg 100 mL/hr over 60 Minutes Intravenous  Once 07/20/23 1417 07/20/23 1727   07/20/23 1430  ceFEPIme  (MAXIPIME ) 2 g in sodium chloride  0.9 % 100 mL IVPB        2 g 200 mL/hr over 30 Minutes Intravenous  Once 07/20/23 1419 07/20/23 1557       Procedures:   Consultants:     Assessment and Plan: * UTI due  to extended-spectrum beta lactamase (ESBL) producing Escherichia coli Since admission, in the setting of immunocompromised state due to Hiv/aids and non compliance to meds.  - continue with broad spectrum antibiotics. Currently on Ertapenem  , Linezolid  discontinued.  - urine cultures growing  >100K  ESBL. Sensitive to ertapenem .  07-24-2023 remains on IV Invanz . Day #4. Awaiting ID f/u note. 07-25-2023 ID wants to treat with 14 days of IV Invanz . Today is Day #5 07-26-2023 per ID, unknown end date of IV Invanz . Continue IV Invanz  per ID service. 07-27-2023 continue with IV Invanz . Day #7. End date per ID service. 07-28-2023 Invanz  Day #8. End date of IV abx therapy per ID service. 07-29-2023 Invanz  Day #9. End date of IV abx therapy per ID service. Discussed with ID yesterday. They are awaiting MIC on oral alternative. MIC won't be back until next week. 07-30-2023 Invanz  Day #10. Awaiting MIC for oral alternative. 07-31-2023 Invanz  Day #11. Awaiting MIC for oral alternative. 08-01-2023 Invanz  Day #12. Awaiting MIC for oral alternative(omadacycline )  08-02-2023 Invanz  Day #13. 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  Per urology, this is a complex, finding and may require management at a tertiary care center with possible perineal excision of what is effectively a bladder diverticulum into the prostate 07-26-2023 he will need referral to tertiary care center with urologic surgery service that can manage this complex prostate/bladder anatomy after he has been treated for his ESBL E. Coli infection. 07-27-2023 urology to arrange outpatient referral to [facility] or tertiary care center. 07-28-2023 urology planning on outpatient referral to [facility]/tertiary care center. Pt is NOT going to be transferred during this  hospitalization. 07-29-2023 confirmed with urology that transfer during this admission to tertiary care center is not possible. Urology service finding out if [facility] will even agree to see him as outpatient. 07-31-2023 will need outpatient referral to tertiary care center for urologic consultation.  08-02-2023 have reached out to urology to see if [facility] urologist will see this patient in clinic.  Sepsis due to Escherichia coli (E. coli) (HCC) 07-24-2023 present on admission. On admission, temp 101.7, RR 22, HR 104. UA shows significant hemoglobinuria, proteinuria, positive nitrite, moderate leuks, RBC >50, WBC >50, and many bacteria.  Sepsis has resolved with treatment of his E. Coli UTI.  Febrile illness 07-24-2023 related to his untreated HIV, coronavirus infection and ESBL e. Coli infection. 07-25-2023 he is afebrile now.  Symptomatic HIV infection (HCC) 07-24-2023 seen by ID. Started on Sumtuza, dapsone  for PJP prophylaxis. 07-25-2023 continue with HIV meds and dapsone  for PJP prophylaxis.  07-26-2023 thru 08-02-2023 stable  on Symtuza  and dapsone . No evidence for reconstitution syndrome.  Neutropenia with fever (HCC) 07-25-2023 related to his untreated HIV. 07-25-2022 fever has resolved.  AKI (acute kidney injury) (HCC) 07-24-2023 baseline SCR 1.3 from [facility] where labs.  AKI has resolved with IVF.  Coronavirus infection 07-25-2023 Not covid-19.  Continue with supportive care. On RA. 07-26-2023 awaiting ID recs on how long he needs to remain in droplet isolation. 01-02-205 await ID to remove droplet isolation precautions. 07-28-2023 stable. On RA. Pt has been in droplet isolation since admission. Defer to ID service to remove droplet isolation. 07-31-2023 Pt has been in droplet isolation since admission. Defer to ID service to remove droplet isolation. On RA.  08-02-2023 resolved. Yesterday, ID service said that pt can come of droplet isolation.  Pancytopenia (HCC) 07-25-2023  related to his untreated HIV.  Homelessness 07-24-2023 pt states his mother lives in [place]. TOC consulted for assistance in housing. Pt just returned from [place]  where he was staying with family friend.  07-25-2023 TOC is engaged and pt may ultimately need to go to homeless shelter at discharge.  History of syphilis 07-25-2023 pt with prior hx of syphilis.  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  His titer has decreased with prior treatment. RPR titer drop from 1:128 to 1:16 reflects adequate response to treatment back in March 2024.    Malnutrition of moderate degree 07-24-2023 see RD documentation from today. Moderate Malnutrition related to chronic illness as evidenced by mild fat depletion, moderate muscle depletion.    DVT prophylaxis: enoxaparin  (LOVENOX ) injection 40 mg Start: 07/20/23 2200     Code Status: Full Code Family Communication: no family at bedside Disposition Plan: homeless shelter Reason for continuing need for hospitalization: remains on IV Abx.  Objective: Vitals:   08/01/23 1639 08/01/23 1958 08/02/23 0315 08/02/23 0739  BP: 102/67 109/68 101/62 103/64  Pulse: 77 82 79 84  Resp: 16 17 17 16   Temp: (!) 100.4 F (38 C) 98.7 F (37.1 C) 98.4 F (36.9 C) 98.5 F (36.9 C)  TempSrc: Oral Oral Oral Oral  SpO2: 97% 98% 96% 97%  Weight:      Height:        Intake/Output Summary (Last 24 hours) at 08/02/2023 1235 Last data filed at 08/02/2023 1220 Gross per 24 hour  Intake 1000 ml  Output --  Net 1000 ml   Filed Weights   07/21/23 0900 07/21/23 1528 07/31/23 1024  Weight: 84.6 kg 83.5 kg 90.1 kg    Examination:  Physical Exam Vitals and nursing note reviewed.  Constitutional:      General: He is not in acute distress.    Appearance: He is normal weight. He is not toxic-appearing or diaphoretic.  HENT:     Head: Normocephalic and atraumatic.     Nose: Nose normal.  Cardiovascular:     Rate and Rhythm: Normal rate and regular rhythm.   Pulmonary:     Effort: Pulmonary effort is normal.  Abdominal:     General: Bowel sounds are normal.     Palpations: Abdomen is soft.  Musculoskeletal:     Right lower leg: No edema.     Left lower leg: No edema.  Skin:    General: Skin is warm and dry.     Capillary Refill: Capillary refill takes less than 2 seconds.  Neurological:     General: No focal deficit present.     Mental Status: He is alert and oriented to person, place, and time.  Recent Results (from the past 240 hours)  MIC (1 Drug)-     Status: None (Preliminary result)   Collection Time: 07/25/23  2:06 PM  Result Value Ref Range Status   Min Inhibitory Conc (1 Drug) REFERT  Final    Comment: (NOTE) Please refer to the following specimen for additional lab results. Please refer to spec # [id number] for test result. Almarie Schneider notified 07-28-2023 at 17:55 AM. Performed At: [facility] [address] [place], [place] [id number] Jennette Shorter MD Ey:[id number]    Source PENDING  Incomplete    Scheduled Meds:  clotrimazole    Topical BID   cyanocobalamin   1,000 mcg Intramuscular Weekly   dapsone   100 mg Oral Daily   Darunavir -Cobicistat-Emtricitabine -Tenofovir  Alafenamide  1 tablet Oral Q breakfast   enoxaparin  (LOVENOX ) injection  40 mg Subcutaneous Q24H   feeding supplement  237 mL Oral TID BM   ferrous sulfate   325 mg Oral BID WC   midodrine   5 mg Oral TID WC   multivitamin with minerals  1 tablet Oral Daily   pantoprazole   40 mg Oral Daily   senna-docusate  2 tablet Oral BID   sodium bicarbonate   1,300 mg Oral BID   tamsulosin   0.4 mg Oral QPC supper   Continuous Infusions:  ertapenem  Stopped (08/02/23 1220)     LOS: 13 days   Time spent: 35 minutes  Camellia Door, DO  [facility]  08/02/2023, 12:35 PM

## 2023-08-02 NOTE — Progress Notes (Signed)
     Subjective: NAEON. Pt included his mother on the phone so we could review the plan of care. She disclosed that he was presently homeless and lacked any real resources. All questions were answered to their satisfaction.  Objective: Vital signs in last 24 hours: Temp:  [98.4 F (36.9 C)-100.4 F (38 C)] 98.5 F (36.9 C) (01/08 0739) Pulse Rate:  [77-84] 84 (01/08 0739) Resp:  [16-17] 16 (01/08 0739) BP: (101-109)/(62-68) 103/64 (01/08 0739) SpO2:  [96 %-98 %] 97 % (01/08 0739)  Assessment/Plan: #prostatic bladder communication Presumed prostatic fluid collection intermittently causing urethral obstruction. 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 This would require surgical intervention at a tertiary care center. Per Dr. Fleeta, imagine reviewed with Dr. Levonia of [facility]. He will see him outpatient.   Reviewed that patients medical compliance and maintenance of his immune system would be essential to his recovery and eventual surgical candidacy. He and his mother expressed understanding.   He reports to be voiding completely at this time and PVR was 8mL. No evidence of obstructive uropathy presently.   ABX and antivirals per ID  No surgical intervention indicated at this time. Urology will sign off. Please call with questions.   Intake/Output from previous day: 01/07 0701 - 01/08 0700 In: 500 [P.O.:500] Out: -   Intake/Output this shift: Total I/O In: 500 [IV Piggyback:500] Out: -   Physical Exam:  General: Alert and oriented CV: No cyanosis Lungs: equal chest rise Abdomen: Soft, NTND, no rebound or guarding   Lab Results: No results for input(s): HGB, HCT in the last 72 hours. BMET No results for input(s): NA, K, CL, CO2, GLUCOSE, BUN, CREATININE, CALCIUM  in the last 72 hours.   Studies/Results: No results found.    LOS: 13 days   Jacob Bourdon, NP [facility] Pager: [phone]  08/02/2023,  12:58 PM

## 2023-08-03 DIAGNOSIS — N390 Urinary tract infection, site not specified: Secondary | ICD-10-CM | POA: Diagnosis not present

## 2023-08-03 DIAGNOSIS — B9629 Other Escherichia coli [E. coli] as the cause of diseases classified elsewhere: Secondary | ICD-10-CM | POA: Diagnosis not present

## 2023-08-03 DIAGNOSIS — Z1612 Extended spectrum beta lactamase (ESBL) resistance: Secondary | ICD-10-CM | POA: Diagnosis not present

## 2023-08-03 MED ORDER — SENNOSIDES-DOCUSATE SODIUM 8.6-50 MG PO TABS: 2.0000 | ORAL_TABLET | Freq: Every evening | ORAL | Status: DC | PRN

## 2023-08-03 NOTE — Progress Notes (Signed)
 PROGRESS NOTE    [name]  FMW:[id number] DOB: 11/04/1985 DOA: 07/20/2023 PCP: No primary care provider on file.  Subjective: Pt seen and examined. Stable. Afebrile. No complaints. Droplet precautions lifted yesterday Pt states he has been walking in the floor hallways. Wants to know if he will be out of the hospital for his birthday(08-27-1985).  Discussed that MIC for omadacycline  have been sent and awaiting results from outside labs. IV ABX duration to be determined by ID service.   Hospital Course: HPI: Jacob Gutierrez is a 38 y.o. male with medical history significant of HIV/AIDS, medication noncompliance, remote substance abuse, pulmonary coccidiomycosis, syphilis, gonorrhea, monkeypox, ESBL E. coli UTI and HTN who presents to the ED for evaluation of dysuria, fevers and chills. Patient reports he is followed by the [facility] here in [place] but he moved to [place]  and has been out of his HIV medications since June/July. He has had a history of UTIs and prostatitis and was recently admitted for sepsis due to UTI last month in [place] . He came to [facility] for the holidays and has continued to have urinary symptoms including difficulty urinating, pain with urination and dark urine. Over the last 3-4 days, he has had intermittent fevers and sweats. He also endorsed mild dyspnea on exertion and fatigue but denies any cough, headaches, dizziness, penile discharge, abdominal pain, shortness of breath or chest pain. He is sexually active and his last sexual encounter was on unprotected sexual intercourse with a male about a month ago. He reports prior history of substance abuse but has been abstinent for years.   Significant Events: Admitted 07/20/2023 for UTI   Significant Labs: sodium 129, K+ 3.9, bicarb 19, creatinine 1.76, albumin  2.7, WBC 1.9, Hgb 10.7, platelet 130, PT/INR 15.0/1.2, glucose 86, lactic acid 0.6, UA shows significant hemoglobinuria, proteinuria, positive nitrite,  moderate leuks, RBC >50, WBC >50, and many bacteria  Urine Cx with ESBL E. coli    Consultants: ID Urology      Assessment and Plan: * UTI due to extended-spectrum beta lactamase (ESBL) producing Escherichia coli Since admission, in the setting of immunocompromised state due to Hiv/aids and non compliance to meds.  - continue with broad spectrum antibiotics. Currently on Ertapenem  , Linezolid  discontinued.  - urine cultures growing  >100K  ESBL. Sensitive to ertapenem .   Invanz  Day #13. 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 This would require surgical intervention at a tertiary care center. Per Dr. Fleeta, imagine reviewed with Dr. Levonia of [facility]. He will see him outpatient.   Sepsis due to Escherichia coli (E. coli) (HCC) 07-24-2023 present on admission. On admission, temp 101.7, RR 22, HR 104. UA shows significant hemoglobinuria, proteinuria, positive nitrite, moderate leuks, RBC >50, WBC >50, and many bacteria.  Sepsis has resolved with treatment of his E. Coli UTI.   Symptomatic HIV infection (HCC) - stable on Symtuza  and dapsone . No evidence for reconstitution syndrome.   AKI (acute kidney injury) (HCC) -  AKI has resolved with IVF.  Coronavirus infection - resolved. - ID service said that pt can come of droplet isolation.  Pancytopenia (HCC) - related to his untreated HIV.  Homelessness -TOC is engaged and pt may ultimately need to go to homeless shelter at discharge.  History of syphilis 07-25-2023 pt with prior hx of syphilis.  He will always have a positive T. Pallidum  Ab.  His titer has decreased with prior treatment. RPR titer drop from 1:128 to 1:16 reflects adequate response to treatment back in March 2024.    Malnutrition of  moderate degree 07-24-2023 see RD documentation from today. Moderate Malnutrition related to chronic illness as evidenced by mild fat depletion, moderate muscle depletion.    DVT prophylaxis: enoxaparin  (LOVENOX ) injection 40 mg Start: 07/20/23 2200     Code Status: Full Code Family Communication: no family at bedside Disposition Plan: homeless shelter vs mother's house Reason for continuing need for hospitalization: remains on IV Abx.  Objective: Vitals:   08/02/23 1606 08/02/23 2132 08/03/23 0619 08/03/23 0835  BP: 101/67 106/68 101/65 104/71  Pulse: 83 95 81 78  Resp: 16 19 19 20   Temp: 98.8 F (37.1 C) 98.3 F (36.8 C) 98.4 F (36.9 C)   TempSrc: Oral Oral Oral   SpO2: 96% 97% 98% 98%  Weight:      Height:        Intake/Output Summary (Last 24 hours) at 08/03/2023 1201 Last data filed at 08/03/2023 9160 Gross per 24 hour  Intake 1220 ml  Output --  Net 1220 ml   Filed Weights   07/21/23 0900 07/21/23 1528 07/31/23 1024  Weight: 84.6 kg 83.5 kg 90.1 kg    Examination:   General: Appearance:     Overweight male in no acute distress     Lungs:      respirations unlabored  Heart:    Normal heart rate.   MS:   All extremities are intact.   Neurologic:   Awake, alert      Recent Results (from the past 240 hours)  MIC (1 Drug)-     Status: None (Preliminary result)   Collection Time: 07/25/23  2:06 PM  Result Value Ref Range Status   Min Inhibitory Conc (1 Drug) REFERT  Final    Comment: (NOTE) Please refer to the following specimen for additional lab results. Please refer to spec # [id number] for test result. Almarie Schneider notified 07-28-2023 at 17:55 AM. Performed At: [facility] [address] [place], [place] [id number] Jennette Shorter MD Ey:[id number]    Source PENDING  Incomplete    Scheduled Meds:  clotrimazole    Topical BID   cyanocobalamin   1,000 mcg Intramuscular Weekly   dapsone   100 mg Oral Daily    Darunavir -Cobicistat-Emtricitabine -Tenofovir  Alafenamide  1 tablet Oral Q breakfast   enoxaparin  (LOVENOX ) injection  40 mg Subcutaneous Q24H   feeding supplement  237 mL Oral TID BM   ferrous sulfate   325 mg Oral BID WC   midodrine   5 mg Oral TID WC   multivitamin with minerals  1 tablet Oral Daily   pantoprazole   40 mg Oral Daily   sodium bicarbonate   1,300 mg Oral BID   tamsulosin   0.4 mg Oral QPC supper   Continuous Infusions:  ertapenem  1 g (08/03/23 1009)     LOS: 14 days   Time spent: 35 minutes  Harlene RAYMOND Bowl, DO  [facility]  08/03/2023, 12:01 PM

## 2023-08-03 NOTE — Plan of Care (Signed)
   Problem: Activity: Goal: Risk for activity intolerance will decrease Outcome: Progressing   Problem: Nutrition: Goal: Adequate nutrition will be maintained Outcome: Progressing   Problem: Safety: Goal: Ability to remain free from injury will improve Outcome: Progressing   Problem: Skin Integrity: Goal: Risk for impaired skin integrity will decrease Outcome: Progressing

## 2023-08-04 DIAGNOSIS — B9629 Other Escherichia coli [E. coli] as the cause of diseases classified elsewhere: Secondary | ICD-10-CM | POA: Diagnosis not present

## 2023-08-04 DIAGNOSIS — N390 Urinary tract infection, site not specified: Secondary | ICD-10-CM | POA: Diagnosis not present

## 2023-08-04 DIAGNOSIS — Z1612 Extended spectrum beta lactamase (ESBL) resistance: Secondary | ICD-10-CM | POA: Diagnosis not present

## 2023-08-04 NOTE — Plan of Care (Signed)

## 2023-08-04 NOTE — Plan of Care (Signed)
  Problem: Health Behavior/Discharge Planning: Goal: Ability to manage health-related needs will improve Outcome: Progressing   Problem: Clinical Measurements: Goal: Ability to maintain clinical measurements within normal limits will improve Outcome: Progressing Goal: Will remain free from infection Outcome: Progressing Goal: Respiratory complications will improve Outcome: Progressing   Problem: Activity: Goal: Risk for activity intolerance will decrease Outcome: Progressing   Problem: Nutrition: Goal: Adequate nutrition will be maintained Outcome: Progressing   Problem: Coping: Goal: Level of anxiety will decrease Outcome: Progressing   Problem: Elimination: Goal: Will not experience complications related to bowel motility Outcome: Progressing Goal: Will not experience complications related to urinary retention Outcome: Progressing   Problem: Pain Management: Goal: General experience of comfort will improve Outcome: Progressing   Problem: Safety: Goal: Ability to remain free from injury will improve Outcome: Progressing   Problem: Skin Integrity: Goal: Risk for impaired skin integrity will decrease Outcome: Progressing

## 2023-08-04 NOTE — Progress Notes (Signed)
 PROGRESS NOTE    Jacob Gutierrez  FMW:[id number] DOB: Mar 11, 1986 DOA: 07/20/2023 PCP: No primary care provider on file.  Subjective: Plans to go to hotel upon d//c  Hospital Course: HPI: Jacob Gutierrez is a 38 y.o. male with medical history significant of HIV/AIDS, medication noncompliance, remote substance abuse, pulmonary coccidiomycosis, syphilis, gonorrhea, monkeypox, ESBL E. coli UTI and HTN who presents to the ED for evaluation of dysuria, fevers and chills. Patient reports he is followed by the [facility] here in [place] but he moved to [place]  and has been out of his HIV medications since June/July. He has had a history of UTIs and prostatitis and was recently admitted for sepsis due to UTI last month in [place] . He came to [facility] for the holidays and has continued to have urinary symptoms including difficulty urinating, pain with urination and dark urine. Over the last 3-4 days, he has had intermittent fevers and sweats. He also endorsed mild dyspnea on exertion and fatigue but denies any cough, headaches, dizziness, penile discharge, abdominal pain, shortness of breath or chest pain. He is sexually active and his last sexual encounter was on unprotected sexual intercourse with a male about a month ago. He reports prior history of substance abuse but has been abstinent for years.   Significant Events: Admitted 07/20/2023 for UTI   Significant Labs: sodium 129, K+ 3.9, bicarb 19, creatinine 1.76, albumin  2.7, WBC 1.9, Hgb 10.7, platelet 130, PT/INR 15.0/1.2, glucose 86, lactic acid 0.6, UA shows significant hemoglobinuria, proteinuria, positive nitrite, moderate leuks, RBC >50, WBC >50, and many bacteria  Urine Cx with ESBL E. coli    Consultants: ID Urology      Assessment and Plan: * UTI due to extended-spectrum beta lactamase (ESBL) producing Escherichia coli Since admission, in the setting of immunocompromised state due to Hiv/aids and non compliance to meds.  -  continue with broad spectrum antibiotics. Currently on Ertapenem  , Linezolid  discontinued.  - urine cultures growing  >100K  ESBL. Sensitive to ertapenem .   Invanz  Day #13. 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 This would require surgical intervention at a tertiary care center. Per Dr. Fleeta, imagine reviewed with Dr. Levonia of [facility]. He will see him outpatient.   Sepsis due to Escherichia coli (E. coli) (HCC) 07-24-2023 present on admission. On admission, temp 101.7, RR 22, HR 104. UA shows significant hemoglobinuria, proteinuria, positive nitrite, moderate leuks, RBC >50, WBC >50, and many bacteria.  Sepsis has resolved with treatment of his E. Coli UTI.   Symptomatic HIV infection (HCC) - stable on Symtuza  and dapsone . No evidence for reconstitution syndrome.   AKI (acute kidney injury) (HCC) -  AKI has resolved with IVF.  Coronavirus infection - resolved. - ID service said that pt can come of droplet isolation.  Pancytopenia (HCC) - related to his untreated HIV.  Homelessness -TOC is engaged and pt may ultimately need to go to homeless shelter at discharge.  History of syphilis 07-25-2023 pt with prior hx of syphilis.  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  His titer has decreased with prior treatment. RPR titer drop from 1:128 to 1:16 reflects adequate response to treatment back in March 2024.    Malnutrition of moderate degree 07-24-2023 see RD documentation from today. Moderate Malnutrition related to chronic illness as evidenced by mild fat depletion, moderate muscle depletion.  DVT prophylaxis: enoxaparin  (LOVENOX ) injection 40 mg Start: 07/20/23 2200     Code Status: Full Code Family Communication: no family at  bedside Disposition Plan: hotel Reason for continuing need for hospitalization: remains on IV Abx.  Objective: Vitals:   08/03/23 1644 08/03/23 1843 08/04/23 0451 08/04/23 0809  BP: 110/67 103/68 106/71 104/69  Pulse: 75 72 82 73  Resp: 16 16 16 16   Temp: 97.9 F (36.6 C) 98.3 F (36.8 C) 99 F (37.2 C) (!) 97.5 F (36.4 C)  TempSrc: Oral Oral Oral Oral  SpO2: 98% 97% 98% 98%  Weight:      Height:        Intake/Output Summary (Last 24 hours) at 08/04/2023 1136 Last data filed at 08/04/2023 0930 Gross per 24 hour  Intake 600 ml  Output --  Net 600 ml   Filed Weights   07/21/23 0900 07/21/23 1528 07/31/23 1024  Weight: 84.6 kg 83.5 kg 90.1 kg    Examination:  In bed, NAD   Recent Results (from the past 240 hours)  MIC (1 Drug)-     Status: None (Preliminary result)   Collection Time: 07/25/23  2:06 PM  Result Value Ref Range Status   Min Inhibitory Conc (1 Drug) REFERT  Final    Comment: (NOTE) Please refer to the following specimen for additional lab results. Please refer to spec # [id number] for test result. Almarie Schneider notified 07-28-2023 at 17:55 AM. Performed At: [facility] [address] [place], [place] [id number] Jennette Shorter MD Ey:[id number]    Source PENDING  Incomplete    Scheduled Meds:  clotrimazole    Topical BID   cyanocobalamin   1,000 mcg Intramuscular Weekly   dapsone   100 mg Oral Daily   Darunavir -Cobicistat-Emtricitabine -Tenofovir  Alafenamide  1 tablet Oral Q breakfast   enoxaparin  (LOVENOX ) injection  40 mg Subcutaneous Q24H   feeding supplement  237 mL Oral TID BM   ferrous sulfate   325 mg Oral BID WC   midodrine   5 mg Oral TID WC   multivitamin with minerals  1 tablet Oral Daily   pantoprazole   40 mg Oral Daily   sodium bicarbonate   1,300 mg Oral BID   tamsulosin   0.4 mg Oral QPC supper   Continuous Infusions:  ertapenem  1 g (08/04/23 1006)     LOS: 15 days   Time spent: 35 minutes  Harlene RAYMOND Bowl,  DO  [facility]  08/04/2023, 11:36 AM

## 2023-08-05 DIAGNOSIS — B9629 Other Escherichia coli [E. coli] as the cause of diseases classified elsewhere: Secondary | ICD-10-CM | POA: Diagnosis not present

## 2023-08-05 DIAGNOSIS — N390 Urinary tract infection, site not specified: Secondary | ICD-10-CM | POA: Diagnosis not present

## 2023-08-05 DIAGNOSIS — Z1612 Extended spectrum beta lactamase (ESBL) resistance: Secondary | ICD-10-CM | POA: Diagnosis not present

## 2023-08-05 LAB — CBC
HCT: 27.1 % — ABNORMAL LOW (ref 39.0–52.0)
Hemoglobin: 8.9 g/dL — ABNORMAL LOW (ref 13.0–17.0)
MCH: 31.9 pg (ref 26.0–34.0)
MCHC: 32.8 g/dL (ref 30.0–36.0)
MCV: 97.1 fL (ref 80.0–100.0)
Platelets: 196 10*3/uL (ref 150–400)
RBC: 2.79 MIL/uL — ABNORMAL LOW (ref 4.22–5.81)
RDW: 18.8 % — ABNORMAL HIGH (ref 11.5–15.5)
WBC: 3 10*3/uL — ABNORMAL LOW (ref 4.0–10.5)
nRBC: 0.7 % — ABNORMAL HIGH (ref 0.0–0.2)

## 2023-08-05 LAB — BASIC METABOLIC PANEL
Anion gap: 4 — ABNORMAL LOW (ref 5–15)
BUN: 23 mg/dL — ABNORMAL HIGH (ref 6–20)
CO2: 23 mmol/L (ref 22–32)
Calcium: 8.4 mg/dL — ABNORMAL LOW (ref 8.9–10.3)
Chloride: 108 mmol/L (ref 98–111)
Creatinine, Ser: 1.32 mg/dL — ABNORMAL HIGH (ref 0.61–1.24)
GFR, Estimated: >60 mL/min (ref >60)
Glucose, Bld: 105 mg/dL — ABNORMAL HIGH (ref 70–99)
Potassium: 3.9 mmol/L (ref 3.5–5.1)
Sodium: 135 mmol/L (ref 135–145)

## 2023-08-05 NOTE — Progress Notes (Signed)
 PROGRESS NOTE    Jacob Gutierrez  FMW:[id number] DOB: [birth year] DOA: 07/20/2023 PCP: No primary care provider on file.  Subjective: Plans to go to hotel upon d//c  Hospital Course: HPI: Jacob Gutierrez is a 38 y.o. male with medical history significant of HIV/AIDS, medication noncompliance, remote substance abuse, pulmonary coccidiomycosis, syphilis, gonorrhea, monkeypox, ESBL E. coli UTI and HTN who presents to the ED for evaluation of dysuria, fevers and chills. Patient reports he is followed by the [facility] here in [place] but he moved to [place]  and has been out of his HIV medications since June/July. He has had a history of UTIs and prostatitis and was recently admitted for sepsis due to UTI last month in [place] . He came to [facility] for the holidays and has continued to have urinary symptoms including difficulty urinating, pain with urination and dark urine. Over the last 3-4 days, he has had intermittent fevers and sweats. He also endorsed mild dyspnea on exertion and fatigue but denies any cough, headaches, dizziness, penile discharge, abdominal pain, shortness of breath or chest pain. He is sexually active and his last sexual encounter was on unprotected sexual intercourse with a male about a month ago. He reports prior history of substance abuse but has been abstinent for years.   Significant Events: Admitted 07/20/2023 for UTI   Significant Labs: sodium 129, K+ 3.9, bicarb 19, creatinine 1.76, albumin  2.7, WBC 1.9, Hgb 10.7, platelet 130, PT/INR 15.0/1.2, glucose 86, lactic acid 0.6, UA shows significant hemoglobinuria, proteinuria, positive nitrite, moderate leuks, RBC >50, WBC >50, and many bacteria  Urine Cx with ESBL E. coli    Consultants: ID Urology      Assessment and Plan: * UTI due to extended-spectrum beta lactamase (ESBL) producing Escherichia coli Since admission, in the setting of immunocompromised state due to Hiv/aids and non compliance to meds.  -  continue with broad spectrum antibiotics. Currently on Ertapenem  , Linezolid  discontinued.  - urine cultures growing  >100K  ESBL. Sensitive to ertapenem .   Invanz . 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 This would require surgical intervention at a tertiary care center. Per Dr. Fleeta, imagine reviewed with Dr. Levonia of [facility]. He will see him outpatient.   Sepsis due to Escherichia coli (E. coli) (HCC) 07-24-2023 present on admission. On admission, temp 101.7, RR 22, HR 104. UA shows significant hemoglobinuria, proteinuria, positive nitrite, moderate leuks, RBC >50, WBC >50, and many bacteria.  Sepsis has resolved with treatment of his E. Coli UTI.   Symptomatic HIV infection (HCC) - stable on Symtuza  and dapsone . No evidence for reconstitution syndrome.   AKI (acute kidney injury) (HCC) -  AKI has resolved with IVF.  Coronavirus infection - resolved. - ID service said that pt can come of droplet isolation.  Pancytopenia (HCC) - related to his untreated HIV.  Homelessness -TOC is engaged -patient plans to go to hotel after.  History of syphilis 07-25-2023 pt with prior hx of syphilis.  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  His titer has decreased with prior treatment. RPR titer drop from 1:128 to 1:16 reflects adequate response to treatment back in March 2024.    Malnutrition of moderate degree 07-24-2023 see RD documentation from today. Moderate Malnutrition related to chronic illness as evidenced by mild fat depletion, moderate muscle depletion.    DVT prophylaxis: enoxaparin  (LOVENOX ) injection  40 mg Start: 07/20/23 2200     Code Status: Full Code Family Communication: no family at bedside Disposition Plan: hotel Reason for  continuing need for hospitalization: remains on IV Abx.  Objective: Vitals:   08/04/23 1554 08/04/23 1931 08/05/23 0342 08/05/23 0707  BP: 105/65 106/68 107/63 103/64  Pulse: 77 85 85 82  Resp: 16 16 16 16   Temp: 98.7 F (37.1 C) 98.3 F (36.8 C)  99.1 F (37.3 C)  TempSrc: Oral Oral  Oral  SpO2: 98% 98% 97% 100%  Weight:      Height:        Intake/Output Summary (Last 24 hours) at 08/05/2023 1052 Last data filed at 08/04/2023 1943 Gross per 24 hour  Intake 200 ml  Output --  Net 200 ml   Filed Weights   07/21/23 0900 07/21/23 1528 07/31/23 1024  Weight: 84.6 kg 83.5 kg 90.1 kg    Examination:   General: Appearance:     Overweight male in no acute distress     Lungs:     respirations unlabored  Heart:    Normal heart rate.  MS:   All extremities are intact.   Neurologic:   Awake      No results found for this or any previous visit (from the past 240 hours).   Scheduled Meds:  clotrimazole    Topical BID   cyanocobalamin   1,000 mcg Intramuscular Weekly   dapsone   100 mg Oral Daily   Darunavir -Cobicistat-Emtricitabine -Tenofovir  Alafenamide  1 tablet Oral Q breakfast   enoxaparin  (LOVENOX ) injection  40 mg Subcutaneous Q24H   feeding supplement  237 mL Oral TID BM   ferrous sulfate   325 mg Oral BID WC   midodrine   5 mg Oral TID WC   multivitamin with minerals  1 tablet Oral Daily   pantoprazole   40 mg Oral Daily   sodium bicarbonate   1,300 mg Oral BID   tamsulosin   0.4 mg Oral QPC supper   Continuous Infusions:  ertapenem  1 g (08/05/23 1025)     LOS: 16 days   Time spent: 35 minutes  Harlene RAYMOND Bowl, DO  [facility]  08/05/2023, 10:52 AM

## 2023-08-05 NOTE — Plan of Care (Signed)
   Problem: Clinical Measurements: Goal: Ability to maintain clinical measurements within normal limits will improve Outcome: Progressing Goal: Diagnostic test results will improve Outcome: Progressing   Problem: Activity: Goal: Risk for activity intolerance will decrease Outcome: Progressing   Problem: Nutrition: Goal: Adequate nutrition will be maintained Outcome: Progressing

## 2023-08-06 DIAGNOSIS — B9629 Other Escherichia coli [E. coli] as the cause of diseases classified elsewhere: Secondary | ICD-10-CM | POA: Diagnosis not present

## 2023-08-06 DIAGNOSIS — N390 Urinary tract infection, site not specified: Secondary | ICD-10-CM | POA: Diagnosis not present

## 2023-08-06 DIAGNOSIS — Z1612 Extended spectrum beta lactamase (ESBL) resistance: Secondary | ICD-10-CM | POA: Diagnosis not present

## 2023-08-06 NOTE — Plan of Care (Signed)
  Problem: Education: Goal: Knowledge of General Education information will improve Description: Including pain rating scale, medication(s)/side effects and non-pharmacologic comfort measures Outcome: Progressing   Problem: Activity: Goal: Risk for activity intolerance will decrease Outcome: Progressing   Problem: Nutrition: Goal: Adequate nutrition will be maintained Outcome: Progressing   Problem: Pain Management: Goal: General experience of comfort will improve Outcome: Progressing   Problem: Safety: Goal: Ability to remain free from injury will improve Outcome: Progressing   Problem: Skin Integrity: Goal: Risk for impaired skin integrity will decrease Outcome: Progressing

## 2023-08-06 NOTE — Progress Notes (Signed)
 PROGRESS NOTE    [name]  FMW:[id number] DOB: Dec 19, 1985 DOA: 07/20/2023 PCP: No primary care provider on file.  Subjective: Plans to go to hotel upon d//c  Hospital Course: HPI: Jacob Gutierrez is a 38 y.o. male with medical history significant of HIV/AIDS, medication noncompliance, remote substance abuse, pulmonary coccidiomycosis, syphilis, gonorrhea, monkeypox, ESBL E. coli UTI and HTN who presents to the ED for evaluation of dysuria, fevers and chills. Patient reports he is followed by the [facility] here in [place] but he moved to [place]  and has been out of his HIV medications since June/July. He has had a history of UTIs and prostatitis and was recently admitted for sepsis due to UTI last month in [place] . He came to [facility] for the holidays and has continued to have urinary symptoms including difficulty urinating, pain with urination and dark urine. Over the last 3-4 days, he has had intermittent fevers and sweats. He also endorsed mild dyspnea on exertion and fatigue but denies any cough, headaches, dizziness, penile discharge, abdominal pain, shortness of breath or chest pain. He is sexually active and his last sexual encounter was on unprotected sexual intercourse with a male about a month ago. He reports prior history of substance abuse but has been abstinent for years.   Significant Events: Admitted 07/20/2023 for UTI   Significant Labs: sodium 129, K+ 3.9, bicarb 19, creatinine 1.76, albumin  2.7, WBC 1.9, Hgb 10.7, platelet 130, PT/INR 15.0/1.2, glucose 86, lactic acid 0.6, UA shows significant hemoglobinuria, proteinuria, positive nitrite, moderate leuks, RBC >50, WBC >50, and many bacteria  Urine Cx with ESBL E. coli    Consultants: ID Urology      Assessment and Plan: * UTI due to extended-spectrum beta lactamase (ESBL) producing Escherichia coli Since admission, in the setting of immunocompromised state due to Hiv/aids and non compliance to meds.  -  continue with broad spectrum antibiotics. Currently on Ertapenem  , Linezolid  discontinued.  - urine cultures growing  >100K  ESBL. Sensitive to ertapenem .   Invanz . 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 This would require surgical intervention at a tertiary care center. Per Dr. Fleeta, imagine reviewed with Dr. Levonia of [facility]. He will see him outpatient.   Sepsis due to Escherichia coli (E. coli) (HCC) 07-24-2023 present on admission. On admission, temp 101.7, RR 22, HR 104. UA shows significant hemoglobinuria, proteinuria, positive nitrite, moderate leuks, RBC >50, WBC >50, and many bacteria.  Sepsis has resolved with treatment of his E. Coli UTI.   Symptomatic HIV infection (HCC) - stable on Symtuza  and dapsone . No evidence for reconstitution syndrome.   AKI (acute kidney injury) (HCC) -  AKI has resolved with IVF.  Coronavirus infection - resolved. - ID service said that pt can come of droplet isolation.  Pancytopenia (HCC) - related to his untreated HIV.  Homelessness -TOC is engaged -patient plans to go to hotel after.  History of syphilis 07-25-2023 pt with prior hx of syphilis.  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  His titer has decreased with prior treatment. RPR titer drop from 1:128 to 1:16 reflects adequate response to treatment back in March 2024.    Malnutrition of moderate degree 07-24-2023 see RD documentation from today. Moderate Malnutrition related to chronic illness as evidenced by mild fat depletion, moderate muscle depletion.    DVT prophylaxis: enoxaparin  (LOVENOX ) injection  40 mg Start: 07/20/23 2200     Code Status: Full Code Family Communication: no family at bedside Disposition Plan: hotel Reason for  continuing need for hospitalization: remains on IV Abx.  Objective: Vitals:   08/05/23 1603 08/05/23 2107 08/06/23 0525 08/06/23 0706  BP: 103/60 107/64 100/63 112/75  Pulse: 83 85 85 80  Resp: 18  16 18   Temp: 98.2 F (36.8 C) 98.7 F (37.1 C) 98.5 F (36.9 C) 98.5 F (36.9 C)  TempSrc:  Oral Oral   SpO2: 97% 98% 97% 98%  Weight:      Height:        Intake/Output Summary (Last 24 hours) at 08/06/2023 1031 Last data filed at 08/05/2023 2211 Gross per 24 hour  Intake 100 ml  Output --  Net 100 ml   Filed Weights   07/21/23 0900 07/21/23 1528 07/31/23 1024  Weight: 84.6 kg 83.5 kg 90.1 kg    Examination:   General: Appearance:     Overweight male in no acute distress     Lungs:     respirations unlabored  Heart:    Normal heart rate.  MS:   All extremities are intact.   Neurologic:   Awake, alert      No results found for this or any previous visit (from the past 240 hours).   Scheduled Meds:  clotrimazole    Topical BID   cyanocobalamin   1,000 mcg Intramuscular Weekly   dapsone   100 mg Oral Daily   Darunavir -Cobicistat-Emtricitabine -Tenofovir  Alafenamide  1 tablet Oral Q breakfast   enoxaparin  (LOVENOX ) injection  40 mg Subcutaneous Q24H   feeding supplement  237 mL Oral TID BM   ferrous sulfate   325 mg Oral BID WC   midodrine   5 mg Oral TID WC   multivitamin with minerals  1 tablet Oral Daily   pantoprazole   40 mg Oral Daily   sodium bicarbonate   1,300 mg Oral BID   tamsulosin   0.4 mg Oral QPC supper   Continuous Infusions:  ertapenem  1 g (08/06/23 1008)     LOS: 17 days   Time spent: 35 minutes  Harlene RAYMOND Bowl, DO  [facility]  08/06/2023, 10:31 AM

## 2023-08-06 NOTE — Plan of Care (Signed)
   Problem: Education: Goal: Knowledge of General Education information will improve Description: Including pain rating scale, medication(s)/side effects and non-pharmacologic comfort measures Outcome: Progressing   Problem: Activity: Goal: Risk for activity intolerance will decrease Outcome: Progressing   Problem: Nutrition: Goal: Adequate nutrition will be maintained Outcome: Progressing   Problem: Coping: Goal: Level of anxiety will decrease Outcome: Progressing

## 2023-08-07 DIAGNOSIS — B9629 Other Escherichia coli [E. coli] as the cause of diseases classified elsewhere: Secondary | ICD-10-CM | POA: Diagnosis not present

## 2023-08-07 DIAGNOSIS — N390 Urinary tract infection, site not specified: Secondary | ICD-10-CM | POA: Diagnosis not present

## 2023-08-07 DIAGNOSIS — Z1612 Extended spectrum beta lactamase (ESBL) resistance: Secondary | ICD-10-CM | POA: Diagnosis not present

## 2023-08-07 LAB — MINIMUM INHIBITORY CONC. (1 DRUG)

## 2023-08-07 NOTE — Plan of Care (Signed)
°  Problem: Education: Goal: Knowledge of General Education information will improve Description: Including pain rating scale, medication(s)/side effects and non-pharmacologic comfort measures Outcome: Progressing   Problem: Activity: Goal: Risk for activity intolerance will decrease Outcome: Progressing   Problem: Nutrition: Goal: Adequate nutrition will be maintained Outcome: Progressing   Problem: Elimination: Goal: Will not experience complications related to bowel motility Outcome: Progressing   Problem: Safety: Goal: Ability to remain free from injury will improve Outcome: Progressing   Problem: Skin Integrity: Goal: Risk for impaired skin integrity will decrease Outcome: Progressing   

## 2023-08-07 NOTE — Progress Notes (Signed)
 PROGRESS NOTE    Jacob Gutierrez  FMW:[id number] DOB: Oct 03, 1985 DOA: 07/20/2023 PCP: No primary care provider on file.  Subjective: Plans to go to hotel upon d//c  Hospital Course: HPI: Jacob Gutierrez is a 38 y.o. male with medical history significant of HIV/AIDS, medication noncompliance, remote substance abuse, pulmonary coccidiomycosis, syphilis, gonorrhea, monkeypox, ESBL E. coli UTI and HTN who presents to the ED for evaluation of dysuria, fevers and chills. Patient reports he is followed by the [facility] here in [place] but he moved to [place]  and has been out of his HIV medications since June/July. He has had a history of UTIs and prostatitis and was recently admitted for sepsis due to UTI last month in [place] . He came to [facility] for the holidays and has continued to have urinary symptoms including difficulty urinating, pain with urination and dark urine. Over the last 3-4 days, he has had intermittent fevers and sweats. He also endorsed mild dyspnea on exertion and fatigue but denies any cough, headaches, dizziness, penile discharge, abdominal pain, shortness of breath or chest pain. He is sexually active and his last sexual encounter was on unprotected sexual intercourse with a male about a month ago. He reports prior history of substance abuse but has been abstinent for years.   Significant Events: Admitted 07/20/2023 for UTI   Significant Labs: sodium 129, K+ 3.9, bicarb 19, creatinine 1.76, albumin  2.7, WBC 1.9, Hgb 10.7, platelet 130, PT/INR 15.0/1.2, glucose 86, lactic acid 0.6, UA shows significant hemoglobinuria, proteinuria, positive nitrite, moderate leuks, RBC >50, WBC >50, and many bacteria  Urine Cx with ESBL E. coli    Consultants: ID Urology      Assessment and Plan: * UTI due to extended-spectrum beta lactamase (ESBL) producing Escherichia coli Since admission, in the setting of immunocompromised state due to Hiv/aids and non compliance to meds.  -  continue with broad spectrum antibiotics. Currently on Ertapenem  , Linezolid  discontinued.  - urine cultures growing  >100K  ESBL. Sensitive to ertapenem .   Invanz . 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 This would require surgical intervention at a tertiary care center. Per Dr. Fleeta, imagine reviewed with Dr. Levonia of [facility]. He will see him outpatient.   Sepsis due to Escherichia coli (E. coli) (HCC) 07-24-2023 present on admission. On admission, temp 101.7, RR 22, HR 104. UA shows significant hemoglobinuria, proteinuria, positive nitrite, moderate leuks, RBC >50, WBC >50, and many bacteria.  Sepsis has resolved with treatment of his E. Coli UTI.   Symptomatic HIV infection (HCC) - stable on Symtuza  and dapsone . No evidence for reconstitution syndrome.   AKI (acute kidney injury) (HCC) -  AKI has resolved with IVF.  Coronavirus infection - resolved. - ID service said that pt can come of droplet isolation.  Pancytopenia (HCC) - related to his untreated HIV.  Homelessness -TOC is engaged -patient plans to go to hotel after.  History of syphilis 07-25-2023 pt with prior hx of syphilis.  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  His titer has decreased with prior treatment. RPR titer drop from 1:128 to 1:16 reflects adequate response to treatment back in March 2024.    Malnutrition of moderate degree 07-24-2023 see RD documentation from today. Moderate Malnutrition related to chronic illness as evidenced by mild fat depletion, moderate muscle depletion.    DVT prophylaxis: enoxaparin  (LOVENOX ) injection  40 mg Start: 07/20/23 2200     Code Status: Full Code Family Communication: no family at bedside Disposition Plan: hotel Reason for  continuing need for hospitalization: remains on IV Abx.  Objective: Vitals:   08/06/23 0706 08/06/23 1951 08/07/23 0312 08/07/23 1000  BP: 112/75 106/65 98/66 108/70  Pulse: 80 80 78 85  Resp: 18 16 14 17   Temp: 98.5 F (36.9 C) 98.1 F (36.7 C) 98.1 F (36.7 C) 98.4 F (36.9 C)  TempSrc:   Oral Oral  SpO2: 98% 100% 96% 98%  Weight:      Height:        Intake/Output Summary (Last 24 hours) at 08/07/2023 1128 Last data filed at 08/07/2023 0959 Gross per 24 hour  Intake 860 ml  Output --  Net 860 ml   Filed Weights   07/21/23 0900 07/21/23 1528 07/31/23 1024  Weight: 84.6 kg 83.5 kg 90.1 kg    Examination:   General: Appearance:     Overweight male in no acute distress     Lungs:     respirations unlabored  Heart:    Normal heart rate.  MS:   All extremities are intact.   Neurologic:   Awake, alert      No results found for this or any previous visit (from the past 240 hours).   Scheduled Meds:  clotrimazole    Topical BID   cyanocobalamin   1,000 mcg Intramuscular Weekly   dapsone   100 mg Oral Daily   Darunavir -Cobicistat-Emtricitabine -Tenofovir  Alafenamide  1 tablet Oral Q breakfast   enoxaparin  (LOVENOX ) injection  40 mg Subcutaneous Q24H   feeding supplement  237 mL Oral TID BM   ferrous sulfate   325 mg Oral BID WC   midodrine   5 mg Oral TID WC   multivitamin with minerals  1 tablet Oral Daily   pantoprazole   40 mg Oral Daily   sodium bicarbonate   1,300 mg Oral BID   tamsulosin   0.4 mg Oral QPC supper   Continuous Infusions:  ertapenem  Stopped (08/06/23 1300)     LOS: 18 days   Time spent: 35 minutes  Harlene RAYMOND Bowl, DO  [facility]  08/07/2023, 11:28 AM

## 2023-08-07 NOTE — Progress Notes (Addendum)
  Nutrition Brief Note  Patient is doing well from a nutritional standpoint. He is eating 90-100% of his meals with double proteins and drinking 2-3 Ensures per day. He is gaining weight, no wounds noted, labs/medications reviewed. Having bowel movements.  At this time nutrition will sign off and keep current interventions in place.   INTERVENTION:  - Continue Regular diet and encourage PO intake  - Ensure Enlive po TID, each supplement provides 350 kcal and 20 grams of protein. - x2 proteins with meals  - MVI with minerals daily   If nutrition issues arise, please consult RD.   Olivia Kenning, RD Registered Dietitian  See Amion for more information

## 2023-08-08 ENCOUNTER — Other Ambulatory Visit (HOSPITAL_COMMUNITY): Payer: Self-pay

## 2023-08-08 ENCOUNTER — Telehealth (HOSPITAL_COMMUNITY): Payer: Self-pay | Admitting: Pharmacy Technician

## 2023-08-08 DIAGNOSIS — N390 Urinary tract infection, site not specified: Secondary | ICD-10-CM | POA: Diagnosis not present

## 2023-08-08 DIAGNOSIS — Z1612 Extended spectrum beta lactamase (ESBL) resistance: Secondary | ICD-10-CM | POA: Diagnosis not present

## 2023-08-08 DIAGNOSIS — B9629 Other Escherichia coli [E. coli] as the cause of diseases classified elsewhere: Secondary | ICD-10-CM | POA: Diagnosis not present

## 2023-08-08 LAB — MISC LABCORP TEST (SEND OUT)
LabCorp test name: 96388
Labcorp test code: 96388

## 2023-08-08 LAB — URINE CULTURE: Culture: 100000 — AB

## 2023-08-08 NOTE — Plan of Care (Signed)
  Problem: Education: Goal: Knowledge of General Education information will improve Description: Including pain rating scale, medication(s)/side effects and non-pharmacologic comfort measures Outcome: Progressing   Problem: Nutrition: Goal: Adequate nutrition will be maintained Outcome: Progressing   Problem: Coping: Goal: Level of anxiety will decrease Outcome: Progressing   Problem: Health Behavior/Discharge Planning: Goal: Ability to manage health-related needs will improve Outcome: Not Progressing

## 2023-08-08 NOTE — Plan of Care (Signed)

## 2023-08-08 NOTE — Telephone Encounter (Signed)
 Patient Product/process Development Scientist completed.    The patient is insured through [employer] Patient has Medicare and is not eligible for a copay card, but may be able to apply for patient assistance or Medicare RX Payment Plan (Patient Must reach out to their plan, if eligible for payment plan), if available.    Ran test claim for Symtuza  and the current 30 day co-pay is $0.00.  Ran test claim for dapsone  100 mg and the current 30 day co-pay is $0.00.  This test claim was processed through [facility]- copay amounts may vary at other pharmacies due to pharmacy/plan contracts, or as the patient moves through the different stages of their insurance plan.     Reyes Sharps, CPHT Pharmacy Technician III Certified Patient Advocate [facility] Patient Advocate Team Direct Number: [phone]  Fax: [phone]

## 2023-08-08 NOTE — Progress Notes (Signed)
 PROGRESS NOTE    Jacob Gutierrez  FMW:[id number] DOB: 04-03-1986 DOA: 07/20/2023 PCP: No primary care provider on file.  Subjective: No overnight events   Hospital Course: HPI: Jacob Gutierrez is a 38 y.o. male with medical history significant of HIV/AIDS, medication noncompliance, remote substance abuse, pulmonary coccidiomycosis, syphilis, gonorrhea, monkeypox, ESBL E. coli UTI and HTN who presents to the ED for evaluation of dysuria, fevers and chills. Patient reports he is followed by the [facility] here in [place] but he moved to [place]  and has been out of his HIV medications since June/July. He has had a history of UTIs and prostatitis and was recently admitted for sepsis due to UTI last month in [place] . He came to [facility] for the holidays and has continued to have urinary symptoms including difficulty urinating, pain with urination and dark urine. Over the last 3-4 days, he has had intermittent fevers and sweats. He also endorsed mild dyspnea on exertion and fatigue but denies any cough, headaches, dizziness, penile discharge, abdominal pain, shortness of breath or chest pain. He is sexually active and his last sexual encounter was on unprotected sexual intercourse with a male about a month ago. He reports prior history of substance abuse but has been abstinent for years.    Consultants: ID Urology      Assessment and Plan: * UTI due to extended-spectrum beta lactamase (ESBL) producing Escherichia coli Since admission, in the setting of immunocompromised state due to Hiv/aids and non compliance to meds.  - continue with broad spectrum antibiotics. Currently on Ertapenem  , Linezolid  discontinued.  - urine cultures growing  >100K  ESBL. Sensitive to ertapenem .   Invanz .--MIC back and there is an oral alternative(omadacycline ) -ID following: The assistance program got approved 1/14 and should hopefully arrive at the clinic tomorrow  Prostate irregularity - bladder diverticulum into  prostate Per urology: Presumed prostatic fluid collection intermittently causing urethral obstruction. 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 This would require surgical intervention at a tertiary care center. Per Dr. Fleeta, imagine reviewed with Dr. Levonia of [facility]. He will see him outpatient.   Sepsis due to Escherichia coli (E. coli) (HCC) -resolved   Symptomatic HIV infection (HCC) - stable on Symtuza  and dapsone . No evidence for reconstitution syndrome.   AKI (acute kidney injury) (HCC) -  AKI has resolved with IVF.  Coronavirus (old type) infection - resolved.  Pancytopenia (HCC) - related to his untreated HIV.  Homelessness -TOC is engaged -patient plans to go to hotel after.  History of syphilis 07-25-2023 pt with prior hx of syphilis.  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  His titer has decreased with prior treatment. RPR titer drop from 1:128 to 1:16 reflects adequate response to treatment back in March 2024.    Malnutrition of moderate degree 07-24-2023 see RD documentation from today. Moderate Malnutrition related to chronic illness as evidenced by mild fat depletion, moderate muscle depletion.    DVT prophylaxis: enoxaparin  (LOVENOX ) injection 40 mg Start: 07/20/23 2200     Code Status: Full Code Family Communication: no family at bedside Disposition Plan: hotel Reason for continuing need for hospitalization: d/c if meds delivered- patient's plan is to go to hotel upon d/c  Objective: Vitals:   08/07/23 1735 08/07/23 2107 08/07/23 2109 08/08/23 1027  BP: 109/70 107/68 101/69 100/72  Pulse: 81 88 86 90  Resp: 17   16  Temp: 98.4 F (36.9 C) 98.5 F (36.9 C) 98.2 F (36.8 C)   TempSrc: Oral Oral  SpO2: 99% 98% 97% 99%  Weight:      Height:        Intake/Output Summary (Last 24 hours) at 08/08/2023 1252 Last data filed at 08/08/2023 1000 Gross per 24 hour  Intake 940 ml  Output --  Net 940 ml   Filed Weights    07/21/23 0900 07/21/23 1528 07/31/23 1024  Weight: 84.6 kg 83.5 kg 90.1 kg    Examination:   General: Appearance:     Overweight male in no acute distress     Lungs:     respirations unlabored  Heart:    Normal heart rate.  MS:   All extremities are intact.   Neurologic:   Awake, alert      No results found for this or any previous visit (from the past 240 hours).   Scheduled Meds:  clotrimazole    Topical BID   cyanocobalamin   1,000 mcg Intramuscular Weekly   dapsone   100 mg Oral Daily   Darunavir -Cobicistat-Emtricitabine -Tenofovir  Alafenamide  1 tablet Oral Q breakfast   enoxaparin  (LOVENOX ) injection  40 mg Subcutaneous Q24H   feeding supplement  237 mL Oral TID BM   ferrous sulfate   325 mg Oral BID WC   midodrine   5 mg Oral TID WC   multivitamin with minerals  1 tablet Oral Daily   pantoprazole   40 mg Oral Daily   sodium bicarbonate   1,300 mg Oral BID   tamsulosin   0.4 mg Oral QPC supper   Continuous Infusions:  ertapenem  1 g (08/08/23 1040)     LOS: 19 days   Time spent: 35 minutes  Jacob RAYMOND Bowl, DO  [facility]  08/08/2023, 12:52 PM

## 2023-08-09 ENCOUNTER — Other Ambulatory Visit (HOSPITAL_COMMUNITY): Payer: Self-pay

## 2023-08-09 DIAGNOSIS — B9629 Other Escherichia coli [E. coli] as the cause of diseases classified elsewhere: Secondary | ICD-10-CM | POA: Diagnosis not present

## 2023-08-09 DIAGNOSIS — N390 Urinary tract infection, site not specified: Secondary | ICD-10-CM | POA: Diagnosis not present

## 2023-08-09 DIAGNOSIS — Z1612 Extended spectrum beta lactamase (ESBL) resistance: Secondary | ICD-10-CM | POA: Diagnosis not present

## 2023-08-09 LAB — CBC
HCT: 29.9 % — ABNORMAL LOW (ref 39.0–52.0)
Hemoglobin: 9.6 g/dL — ABNORMAL LOW (ref 13.0–17.0)
MCH: 32 pg (ref 26.0–34.0)
MCHC: 32.1 g/dL (ref 30.0–36.0)
MCV: 99.7 fL (ref 80.0–100.0)
Platelets: 215 10*3/uL (ref 150–400)
RBC: 3 MIL/uL — ABNORMAL LOW (ref 4.22–5.81)
RDW: 20.4 % — ABNORMAL HIGH (ref 11.5–15.5)
WBC: 3 10*3/uL — ABNORMAL LOW (ref 4.0–10.5)
nRBC: 0 % (ref 0.0–0.2)

## 2023-08-09 LAB — BASIC METABOLIC PANEL
Anion gap: 6 (ref 5–15)
BUN: 24 mg/dL — ABNORMAL HIGH (ref 6–20)
CO2: 23 mmol/L (ref 22–32)
Calcium: 8.6 mg/dL — ABNORMAL LOW (ref 8.9–10.3)
Chloride: 106 mmol/L (ref 98–111)
Creatinine, Ser: 1.3 mg/dL — ABNORMAL HIGH (ref 0.61–1.24)
GFR, Estimated: >60 mL/min (ref >60)
Glucose, Bld: 82 mg/dL (ref 70–99)
Potassium: 3.9 mmol/L (ref 3.5–5.1)
Sodium: 135 mmol/L (ref 135–145)

## 2023-08-09 NOTE — Plan of Care (Signed)

## 2023-08-09 NOTE — Plan of Care (Signed)
  Problem: Clinical Measurements: Goal: Will remain free from infection Outcome: Progressing Goal: Diagnostic test results will improve Outcome: Progressing Goal: Respiratory complications will improve Outcome: Progressing Goal: Cardiovascular complication will be avoided Outcome: Progressing   Problem: Coping: Goal: Level of anxiety will decrease Outcome: Progressing   

## 2023-08-09 NOTE — Progress Notes (Signed)
 PROGRESS NOTE    Jacob Gutierrez  YQM:[id number] DOB: February 12, 1986 DOA: 07/20/2023 PCP: No primary care provider on file.   Brief Narrative: Jacob Gutierrez is a 38 y.o. male with a history of HIV/AIDS, medication nonadherence, substance abuse, pulmonary coccidiomycosis, syphilis, gonorrhea, monkeypox, ESBL E. coli UTI, hypertension.  Patient presented secondary to dysuria, fevers and chills and was found to have evidence of sepsis secondary to UTI.  Urine culture grew ESBL E. coli complicating treatment.  Patient has been managed on ertapenem  for treatment.  Overall, patient is improving.  During hospitalization, patient was found to have evidence of a prostate bladder communication requiring urology consultation with ultimate recommendations for follow-up at [facility] for urologic evaluation.  Patient to be discharged on omadacycline  going outpatient treatment of ESBL UTI.   Assessment and Plan:  UTI Secondary to ESBL E. Coli. Patient treated with ertapenem . Unable to treat with IV antibiotics secondary to homelessness. Patient with plan to transition to omadacycline  on discharge to complete a 2 week course. Patient to follow-up with ID  Prostate bladder communication Urology consulted. Recommendation for follow-up with urology at [facility]. Information for follow-up placed in chart.  Sepsis Present on admission. Secondary to UTI. Blood cultures negative. Physiology improved with treatment of UTI  HIV infection AIDS CD4 count is undetectable.  HIV quant of 1.18 million.  Secondary to medication non-adherence. Symtuza  restarted. Dapsone  started for prophylaxis. Bactrim  held secondary to renal function.  Coronavirus infection Resolved.  Pancytopenia Secondary to acute illness. WBC improving. Hemoglobin stable. Thrombocytopenia resolved.  Homelessness Noted. TOC involved. Patient with plan to live in a hotel.  History of syphilis Previously treated with decrease in titer.  Moderate  malnutrition Noted. -Continue protein supplementation   DVT prophylaxis: Lovenox  Code Status:   Code Status: Full Code Family Communication: Mother on telephone Disposition Plan: Discharge to hotel in AM   Consultants:  Infectious disease Urology  Procedures:  None  Antimicrobials: Flagyl  Linezolid  Ertapenem  Dapsone  Cefepime  Symtuza    Subjective: Patient reports no concerns this morning.  Objective: BP 103/68 (BP Location: Left Arm)   Pulse 90   Temp 98.4 F (36.9 C) (Oral)   Resp 18   Ht 6\' 1"  (1.854 m)   Wt 90.1 kg   SpO2 96%   BMI 26.21 kg/m   Examination:  General exam: Appears calm and comfortable Respiratory system: Clear to auscultation. Respiratory effort normal. Cardiovascular system: S1 & S2 heard, RRR. No murmurs. Gastrointestinal system: Abdomen is nondistended, soft and nontender. Normal bowel sounds heard. Central nervous system: Alert and oriented. No focal neurological deficits. Musculoskeletal: No edema. No calf tenderness Psychiatry: Judgement and insight appear normal. Mood & affect appropriate.    Data Reviewed: I have personally reviewed following labs and imaging studies  CBC Lab Results  Component Value Date   WBC 3.0 (L) 08/09/2023   RBC 3.00 (L) 08/09/2023   HGB 9.6 (L) 08/09/2023   HCT 29.9 (L) 08/09/2023   MCV 99.7 08/09/2023   MCH 32.0 08/09/2023   PLT 215 08/09/2023   MCHC 32.1 08/09/2023   RDW 20.4 (H) 08/09/2023   LYMPHSABS 0.0 (L) 07/22/2023   MONOABS 0.1 07/22/2023   EOSABS 0.0 07/22/2023   BASOSABS 0.0 07/22/2023     Last metabolic panel Lab Results  Component Value Date   NA 135 08/09/2023   K 3.9 08/09/2023   CL 106 08/09/2023   CO2 23 08/09/2023   BUN 24 (H) 08/09/2023   CREATININE 1.30 (H) 08/09/2023   GLUCOSE 82  08/09/2023   GFRNONAA >60 08/09/2023   GFRAA 105 08/25/2020   CALCIUM  8.6 (L) 08/09/2023   PHOS 2.5 10/19/2020   PROT 7.7 07/24/2023   ALBUMIN  2.1 (L) 07/24/2023   LABGLOB 4.8 (H)  10/08/2020   AGRATIO 0.6 (L) 10/08/2020   BILITOT 0.3 07/24/2023   ALKPHOS 50 07/24/2023   AST 182 (H) 07/24/2023   ALT 82 (H) 07/24/2023   ANIONGAP 6 08/09/2023    GFR: Estimated Creatinine Clearance: 86.2 mL/min (A) (by C-G formula based on SCr of 1.3 mg/dL (H)).  No results found for this or any previous visit (from the past 240 hours).    Radiology Studies: No results found.    LOS: 20 days    Aneita Keens, MD [facility] 08/09/2023, 5:17 PM   If 7PM-7AM, please contact night-coverage [email or web address]

## 2023-08-09 NOTE — Plan of Care (Signed)
   Problem: Education: Goal: Knowledge of General Education information will improve Description Including pain rating scale, medication(s)/side effects and non-pharmacologic comfort measures Outcome: Progressing   Problem: Health Behavior/Discharge Planning: Goal: Ability to manage health-related needs will improve Outcome: Progressing

## 2023-08-10 ENCOUNTER — Other Ambulatory Visit (HOSPITAL_COMMUNITY): Payer: Self-pay

## 2023-08-10 DIAGNOSIS — Z1612 Extended spectrum beta lactamase (ESBL) resistance: Secondary | ICD-10-CM | POA: Diagnosis not present

## 2023-08-10 DIAGNOSIS — N390 Urinary tract infection, site not specified: Secondary | ICD-10-CM | POA: Diagnosis not present

## 2023-08-10 DIAGNOSIS — B9629 Other Escherichia coli [E. coli] as the cause of diseases classified elsewhere: Secondary | ICD-10-CM | POA: Diagnosis not present

## 2023-08-10 MED ORDER — SYMTUZA 800-150-200-10 MG PO TABS
1.0000 | ORAL_TABLET | Freq: Every day | ORAL | 0 refills | Status: DC
  Filled 2023-08-10: qty 30, 30d supply, fill #0

## 2023-08-10 MED ORDER — TAMSULOSIN HCL 0.4 MG PO CAPS
0.4000 mg | ORAL_CAPSULE | Freq: Every day | ORAL | 0 refills | Status: DC
  Filled 2023-08-10: qty 30, 30d supply, fill #0

## 2023-08-10 MED ORDER — ADULT MULTIVITAMIN W/MINERALS CH
1.0000 | ORAL_TABLET | Freq: Every day | ORAL | 0 refills | Status: DC
  Filled 2023-08-10: qty 90, 90d supply, fill #0

## 2023-08-10 MED ORDER — ENSURE ENLIVE PO LIQD
237.0000 mL | Freq: Three times a day (TID) | ORAL | 0 refills | Status: DC
  Filled 2023-08-10 – 2023-09-10 (×3): qty 21330, 30d supply, fill #0

## 2023-08-10 MED ORDER — DAPSONE 100 MG PO TABS
100.0000 mg | ORAL_TABLET | Freq: Every day | ORAL | 0 refills | Status: DC
  Filled 2023-08-10: qty 30, 30d supply, fill #0

## 2023-08-10 MED ORDER — SYMTUZA 800-150-200-10 MG PO TABS
1.0000 | ORAL_TABLET | Freq: Every day | ORAL | 0 refills | Status: DC
  Filled 2023-08-10 – 2023-09-10 (×2): qty 30, 30d supply, fill #0

## 2023-08-10 NOTE — Progress Notes (Signed)
[facility]  Date of Admission:  07/20/2023     Total days of antibiotics 21         ASSESSMENT:  Jacob Gutierrez medication has arrived to his mother's house to continue treatment of prostatic fluid collection from likely prostatic bladder communication growing ESBL E. Coli and sensitive to Omadacyline. Discussed plan of care to continue omadacycline for 2 weeks and follow up with Urology. Emphasized importance of taking Symtuza and Dapsone which are available at the [facility] to pick up upon discharge. Will arrange follow up in ID clinic. Ok for discharge. Remaining medical and supportive care per Internal Medicine.   PLAN:  Continue current dose of Symtuza and Dapsone. Start omadacyline once he get's home for ESBL E. Coli prostatic fluid collection.  Follow up with ID and Urology.  Ok for discharge.  Remaining medical and supportive care per Internal Medicine.   Principal Problem:   UTI due to extended-spectrum beta lactamase (ESBL) producing Escherichia coli Active Problems:   Homelessness   Pancytopenia (HCC)   Coronavirus infection   AKI (acute kidney injury) (HCC)   Sepsis due to Escherichia coli (E. 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 Medications received by mother at her house.   Allergies  Allergen Reactions   Vancomycin Anaphylaxis, Itching, Swelling and Other (See Comments)    Angioedema and "everything swells"   Amoxicillin Other (See Comments)    From childhood: "I had a reaction when i was little." (??) Has tolerated multiple cephalosporins in the past     Review of Systems: Review of Systems  Constitutional:  Negative for chills, fever and weight loss.  Respiratory:  Negative for cough, shortness of breath and wheezing.   Cardiovascular:  Negative for chest pain and leg swelling.  Gastrointestinal:  Negative for abdominal pain, constipation, diarrhea, nausea and vomiting.  Skin:  Negative for rash.      OBJECTIVE: Vitals:   08/09/23 1700 08/09/23 2113 08/10/23 0528 08/10/23 0928  BP: 115/74 105/70 105/73   Pulse:  87 79   Resp: 18 18 18    Temp: 98.1 F (36.7 C) 98.4 F (36.9 C) 98.7 F (37.1 C) 98.1 F (36.7 C)  TempSrc: Oral Oral Oral Oral  SpO2: 97% 99% 98% 98%  Weight:      Height:       Body mass index is 26.21 kg/m.  Physical Exam Constitutional:      General: He is not in acute distress.    Appearance: He is well-developed.  Cardiovascular:     Rate and Rhythm: Normal rate and regular rhythm.     Heart sounds: Normal  heart sounds.  Pulmonary:     Effort: Pulmonary effort is normal.     Breath sounds: Normal breath sounds.  Skin:    General: Skin is warm and dry.  Neurological:     Mental Status: He is alert and oriented to person, place, and time.     Lab Results Lab Results  Component Value Date   WBC 3.0 (L) 08/09/2023   HGB 9.6 (L) 08/09/2023   HCT 29.9 (L) 08/09/2023   MCV 99.7 08/09/2023   PLT 215 08/09/2023    Lab Results  Component Value Date   CREATININE 1.30 (H) 08/09/2023   BUN 24 (H) 08/09/2023   NA 135 08/09/2023   K 3.9 08/09/2023   CL 106 08/09/2023   CO2 23 08/09/2023    Lab Results  Component Value Date    ALT 82 (H) 07/24/2023   AST 182 (H) 07/24/2023   ALKPHOS 50 07/24/2023   BILITOT 0.3 07/24/2023     Microbiology: No results found for this or any previous visit (from the past 240 hours).   Jacob Eke, Jacob Gutierrez [facility]  08/10/2023  10:52 AM

## 2023-08-10 NOTE — Discharge Summary (Signed)
Physician Discharge Summary   Patient: Jacob Gutierrez MRN: [id number] DOB: 1985-11-30  Admit date:     07/20/2023  Discharge date: 08/10/23  Discharge Physician: Jacquelin Hawking, MD   PCP: No primary care provider on file.   Recommendations at discharge:  PCP visit for establishment of care and hospital follow-up Urology follow-up for management of prostate bladder communication ID follow-up for continued management of HIV  Discharge Diagnoses: Principal Problem:   UTI due to extended-spectrum beta lactamase (ESBL) producing Escherichia coli Active Problems:   Sepsis due to Escherichia coli (E. 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 *  Hospital Course: [name] is a 38 y.o. male with a history of HIV/AIDS, medication nonadherence, substance abuse, pulmonary coccidiomycosis, syphilis, gonorrhea, monkeypox, ESBL E. coli UTI, hypertension.  Patient presented secondary to dysuria, fevers and chills and was found to have evidence of sepsis secondary to UTI.  Urine culture grew ESBL E. coli complicating treatment.  Patient has been managed on ertapenem for treatment.  Overall, patient is improving.  During hospitalization, patient was found to have evidence of a prostate bladder communication requiring urology consultation with ultimate recommendations for follow-up at [facility] for urologic evaluation.  Patient to be discharged on omadacycline going outpatient treatment of ESBL UTI.  Assessment and Plan:  UTI Secondary to ESBL E. Coli. Patient treated with ertapenem. Unable to treat with IV antibiotics secondary to homelessness. Patient with plan to  transition to omadacycline on discharge to complete a 2 week course. Patient to follow-up with ID.   Prostate bladder communication Urology consulted. Recommendation for follow-up with urology at [facility]. Information for follow-up placed in chart.   Sepsis Present on admission. Secondary to UTI. Blood cultures negative. Physiology improved with treatment of UTI   HIV infection AIDS CD4 count is undetectable.  HIV quant of 1.18 million.  Secondary to medication non-adherence. Symtuza restarted. Dapsone started for prophylaxis. Bactrim held secondary to renal function.   Coronavirus infection Resolved.  Pancytopenia Secondary to acute illness. WBC improving. Hemoglobin stable. Thrombocytopenia resolved.   Homelessness Noted. TOC involved. Patient with plan to live in a hotel.   History of syphilis Previously treated with decrease in titer.   Moderate malnutrition Noted. Continue protein supplementation.   Consultants:  Infectious disease Urology   Procedures:  None  Disposition: Home Diet recommendation: Regular diet   DISCHARGE MEDICATION: Allergies as of 08/10/2023       Reactions   Vancomycin Anaphylaxis, Itching, Swelling, Other (See Comments)   Angioedema and "everything swells"   Amoxicillin Other (See Comments)   From childhood: "I had a reaction when i was little." (??) Has tolerated multiple cephalosporins in the past        Medication List     STOP taking these medications    azithromycin 600 MG tablet Commonly known as: ZITHROMAX   cefdinir 300 MG capsule Commonly known as: OMNICEF   sulfamethoxazole-trimethoprim 800-160 MG tablet Commonly known as: BACTRIM DS   Tivicay 50 MG tablet Generic drug: dolutegravir       TAKE these medications    acetaminophen 500 MG tablet Commonly known as: TYLENOL Take 1  tablet (500 mg total) by mouth every 6 (six) hours as needed.   dapsone 100 MG tablet Take 1 tablet (100 mg total) by mouth daily.    feeding supplement Liqd Take 237 mLs by mouth 3 (three) times daily between meals.   multivitamin with minerals Tabs tablet Take 1 tablet by mouth daily. Start taking on: August 11, 2023   pantoprazole 40 MG tablet Commonly known as: PROTONIX Take 40 mg by mouth daily.   sodium bicarbonate 650 MG tablet Take 1,300 mg by mouth 2 (two) times daily.   Symtuza 800-150-200-10 MG Tabs Generic drug: Darunavir-Cobicistat-Emtricitabine-Tenofovir Alafenamide Take 1 tablet by mouth daily with breakfast.   tamsulosin 0.4 MG Caps capsule Commonly known as: FLOMAX Take 1 capsule (0.4 mg total) by mouth daily after supper.        Follow-up Information     Anders Simmonds, PA-C Follow up.   Specialty: Family Medicine Why: August 16, 2023 at [address] information: [address] [place] [address] [place] [address] [phone]         [facility]              Follow up on 09/11/2023.   Why: 3:00 PM Contact information: [address] [place] [address]        Guy Sandifer, Sharren Bridge, MD. Schedule an appointment as soon as possible for a visit.   Specialty: Urology Why: For hospital follow-up Contact information: [address] [place] [address] [phone]                Discharge Exam: BP 105/73 (BP Location: Left Arm)   Pulse 79   Temp 98.1 F (36.7 C) (Oral)   Resp 18   Ht 6\' 1"  (1.854 m)   Wt 90.1 kg   SpO2 98%   BMI 26.21 kg/m   General exam: Appears calm and comfortable Respiratory system: Respiratory effort normal. Gastrointestinal system: Abdomen is non-distended Central nervous system: Alert and oriented. Psychiatry: Judgement and insight appear normal. Mood & affect appropriate.   Condition at discharge: stable  The results of significant diagnostics from this hospitalization (including imaging, microbiology, ancillary and laboratory) are listed below for  reference.   Imaging Studies: MR PROSTATE W WO CONTRAST Result Date: 07/25/2023 CLINICAL DATA:  Prostatic abscess EXAM: MR PROSTATE WITHOUT AND WITH CONTRAST TECHNIQUE: Multiplanar multisequence MRI images were obtained of the pelvis centered about the prostate. Pre and post contrast images were obtained. CONTRAST:  8mL GADAVIST GADOBUTROL 1 MMOL/ML IV SOLN COMPARISON:  None Available. FINDINGS: Prostate: There is an hourglass shaped/barbell shaped fluid collection which involves the bladder, extends through the prostate gland, and expands inferior to the prostate gland and base of the penis. This is well seen on coronal image 140/MR series 601. Collection is also seen on sagittal image 138/600. The bladder is thick-walled with bilateral hydroureter. Communicating fluid collection inferior to the prostate gland measures 3.8 cm in diameter. No enhancement along the course of the fluid collection within the prostate gland or inferiorly to suggest acute prostatitis or abscess. IMPRESSION: Communicating fluid collection extending from the bladder through the prostate gland and with re-expansion inferior to the pelvic floor. No enhancement to suggest abscess. Electronically Signed   By: Genevive Bi M.D.   On: 07/25/2023 09:42   CT CHEST ABDOMEN PELVIS WO CONTRAST Result Date: 07/24/2023 CLINICAL DATA:  History of AIDS. Pancytopenia. Significant weight loss. History of pulmonary coccidiomycosis. EXAM: CT  CHEST, ABDOMEN AND PELVIS WITHOUT CONTRAST TECHNIQUE: Multidetector CT imaging of the chest, abdomen and pelvis was performed following the standard protocol without IV contrast. RADIATION DOSE REDUCTION: This exam was performed according to the departmental dose-optimization program which includes automated exposure control, adjustment of the mA and/or kV according to patient size and/or use of iterative reconstruction technique. COMPARISON:  Abdomen pelvis CT 12/08/2021 FINDINGS: CT CHEST FINDINGS  Cardiovascular: The heart size is normal. No substantial pericardial effusion. No thoracic aortic aneurysm. No substantial atherosclerosis of the thoracic aorta. Mediastinum/Nodes: No mediastinal lymphadenopathy. No evidence for gross hilar lymphadenopathy although assessment is limited by the lack of intravenous contrast on the current study. The esophagus has normal imaging features. There is no axillary lymphadenopathy. Lungs/Pleura: Paraseptal emphysema noted in the upper lungs bilaterally dependent subsegmental atelectasis noted right lower lobe. No suspicious pulmonary nodule or mass. Subsegmental atelectasis noted left lung base. No pleural effusion. Musculoskeletal: No worrisome lytic or sclerotic osseous abnormality. CT ABDOMEN PELVIS FINDINGS Hepatobiliary: No suspicious focal abnormality in the liver on this study without intravenous contrast. There is no evidence for gallstones, gallbladder wall thickening, or pericholecystic fluid. No intrahepatic or extrahepatic biliary dilation. Pancreas: No focal mass lesion. No dilatation of the main duct. No intraparenchymal cyst. No peripancreatic edema. Spleen: No splenomegaly. No suspicious focal mass lesion. Adrenals/Urinary Tract: No adrenal nodule or mass. Moderate to severe right hydroureteronephrosis is associated with moderate left hydroureteronephrosis. Both ureters are dilated down to the level of the UVJ. Bladder is nondistended with mild irregular circumferential bladder wall thickening. Stomach/Bowel: Stomach is unremarkable. No gastric wall thickening. No evidence of outlet obstruction. Duodenum is normally positioned as is the ligament of Treitz. Small bowel and colon not well evaluated due to breathing motion during image acquisition. No small bowel wall thickening. No small bowel dilatation. The appendix is not well visualized, but there is no edema or inflammation in the region of the cecal tip to suggest appendicitis. Moderate stool volume.  Vascular/Lymphatic: No abdominal aortic aneurysm. No abdominal aortic atherosclerotic calcification. Upper normal lymph nodes are seen in the hepatoduodenal ligament. Mild para-aortic lymphadenopathy noted with 12 mm left para-aortic index node on 64/3. Another 12 mm short axis left para-aortic node evident on 73/3. 12 mm low left para-aortic node visible on 85/3. Borderline lymphadenopathy seen in the pelvic sidewall bilaterally. Reproductive: Areas of fluid density are again noted in the prostate gland. Inferior prostate demonstrates 3.6 x 1.5 cm fluid density structure, increased from 2.9 x 1.2 cm on the previous study and potentially involving the urethra. Other: No intraperitoneal free fluid. Musculoskeletal: No worrisome lytic or sclerotic osseous abnormality. IMPRESSION: 1. Moderate to severe right hydroureteronephrosis with moderate left hydroureteronephrosis. Both ureters are dilated down to the level of the UVJ. No obstructing calculus evident. 2. Mild irregular circumferential bladder wall thickening. 3. Areas of fluid density in the prostate gland are again noted. Inferior prostate demonstrates 3.6 x 1.5 cm fluid density structure, increased from 2.9 x 1.2 cm on the previous study and potentially involving the urethra. As noted on the prior study, prostatic abscess would be a consideration. 4. Mild para-aortic lymphadenopathy with borderline lymphadenopathy in the pelvic sidewall bilaterally. Retroperitoneal lymphadenopathy is mildly progressive since the prior study. 5. No evidence for lymphadenopathy in the chest. 6. 6 mm pulmonary nodule identified at the right lung base previously has resolved. 7.  Emphysema (ICD10-J43.9). As noted previously, Electronically Signed   By: Kennith Center M.D.   On: 07/24/2023 07:05   US RENAL Result  Date: 07/20/2023 CLINICAL DATA:  Hematuria EXAM: RENAL / URINARY TRACT ULTRASOUND COMPLETE COMPARISON:  CT 12/08/2021 FINDINGS: Right Kidney: Renal measurements: 13.2 x  6.1 x 5.2 cm = volume: 220 mL. Mild right hydronephrosis. No mass. Normal echotexture. Left Kidney: Renal measurements: 14.2 x 7.1 x 5.5 cm = volume: 291 mL. Mild left hydronephrosis. No mass. Normal echotexture. Bladder: Layering debris within the bladder. Bladder wall diffusely but somewhat irregularly thickened. Other: None. IMPRESSION: Mild bilateral hydronephrosis. Somewhat irregular but rather diffuse bladder wall thickening. This may be related to cystitis. Recommend clinical correlation. Electronically Signed   By: Charlett Nose M.D.   On: 07/20/2023 23:23   DG Chest 2 View Result Date: 07/20/2023 CLINICAL DATA:  Sepsis EXAM: CHEST - 2 VIEW COMPARISON:  03/03/2021 FINDINGS: The heart size and mediastinal contours are within normal limits. No focal airspace consolidation, pleural effusion, or pneumothorax. The visualized skeletal structures are unremarkable. IMPRESSION: No active cardiopulmonary disease. Electronically Signed   By: Duanne Guess D.O.   On: 07/20/2023 15:12    Microbiology: Results for orders placed or performed during the hospital encounter of 07/20/23  Culture, blood (Routine x 2)     Status: None   Collection Time: 07/20/23  1:10 PM   Specimen: BLOOD LEFT HAND  Result Value Ref Range Status   Specimen Description BLOOD LEFT HAND  Final   Special Requests   Final    BOTTLES DRAWN AEROBIC AND ANAEROBIC Blood Culture results may not be optimal due to an inadequate volume of blood received in culture bottles   Culture   Final    NO GROWTH 5 DAYS Performed at [facility], [address] [place], [place] [address]    Report Status 07/25/2023 FINAL  Final  Urine Culture     Status: Abnormal   Collection Time: 07/20/23  3:13 PM   Specimen: Urine, Clean Catch  Result Value Ref Range Status   Specimen Description URINE, CLEAN CATCH  Final   Special Requests NONE Reflexed from [id number]  Final   Culture (A)  Final    >=100,000 COLONIES/mL ESCHERICHIA COLI CORRECTED  ON 12/30 AT 6045: PREVIOUSLY REPORTED AS >=100,000 COLONIES/mL GRAM NEGATIVE RODS Confirmed Extended Spectrum Beta-Lactamase Producer (ESBL).  In bloodstream infections from ESBL organisms, carbapenems are preferred over piperacillin/tazobactam. They are shown to have a lower risk of mortality. See Scanned report in [facility] Performed at [facility], [address] [place], [place] [address]    Report Status 07/24/2023 FINAL  Final   Organism ID, Bacteria ESCHERICHIA COLI (A)  Final      Susceptibility   Escherichia coli - MIC*    AMPICILLIN >=32 RESISTANT Resistant     CEFAZOLIN >=64 RESISTANT Resistant     CEFEPIME 16 RESISTANT Resistant     CEFTRIAXONE >=64 RESISTANT Resistant     CIPROFLOXACIN >=4 RESISTANT Resistant     GENTAMICIN <=1 SENSITIVE Sensitive     IMIPENEM <=0.25 SENSITIVE Sensitive     NITROFURANTOIN <=16 SENSITIVE Sensitive     TRIMETH/SULFA >=320 RESISTANT Resistant     AMPICILLIN/SULBACTAM 4 SENSITIVE Sensitive     PIP/TAZO <=4 SENSITIVE Sensitive ug/mL    * >=100,000 COLONIES/mL ESCHERICHIA COLI CORRECTED ON 12/30 AT 1914: PREVIOUSLY REPORTED AS >=100,000 COLONIES/mL GRAM NEGATIVE RODS  Respiratory (~20 pathogens) panel by PCR     Status: Abnormal   Collection Time: 07/20/23  6:06 PM   Specimen: Nasopharyngeal Swab; Respiratory  Result Value Ref Range Status   Adenovirus  NOT DETECTED NOT DETECTED Final   Coronavirus 229E NOT DETECTED NOT DETECTED Final    Comment: (NOTE) The Coronavirus on the Respiratory Panel, DOES NOT test for the novel  Coronavirus (2019 nCoV)    Coronavirus HKU1 NOT DETECTED NOT DETECTED Final   Coronavirus NL63 NOT DETECTED NOT DETECTED Final   Coronavirus OC43 DETECTED (A) NOT DETECTED Final   Metapneumovirus NOT DETECTED NOT DETECTED Final   Rhinovirus / Enterovirus NOT DETECTED NOT DETECTED Final   Influenza A NOT DETECTED NOT DETECTED Final   Influenza B NOT DETECTED NOT DETECTED Final   Parainfluenza Virus 1 NOT  DETECTED NOT DETECTED Final   Parainfluenza Virus 2 NOT DETECTED NOT DETECTED Final   Parainfluenza Virus 3 NOT DETECTED NOT DETECTED Final   Parainfluenza Virus 4 NOT DETECTED NOT DETECTED Final   Respiratory Syncytial Virus NOT DETECTED NOT DETECTED Final   Bordetella pertussis NOT DETECTED NOT DETECTED Final   Bordetella Parapertussis NOT DETECTED NOT DETECTED Final   Chlamydophila pneumoniae NOT DETECTED NOT DETECTED Final   Mycoplasma pneumoniae NOT DETECTED NOT DETECTED Final    Comment: Performed at [facility], [address] [place], [place] [address]  Blastomyces Antigen     Status: None   Collection Time: 07/20/23  9:54 PM   Specimen: Blood  Result Value Ref Range Status   Blastomyces Antigen None Detected None Detected ng/mL Final    Comment: (NOTE) Reference Interval: None Detected Reportable Range: 0.31 ng/mL - 20.00 ng/mL Results above 20.00 ng/mL are reported as 'Positive, Above the Limit of Quantification' This test was developed and its performance characteristics determined by [employer]. It has not been cleared or approved by the FDA; however, FDA clearance or approval is not currently required for clinical use. The results are not intended to be used as the sole means for clinical diagnosis or patient decisions.    Interpretation Negative  Final   Specimen Type SERUM  Final    Comment: (NOTE) Performed At: [facility] [address] [place], [place] [id number] Roxanne Gates MD [id number]   MRSA Next Gen by PCR, Nasal     Status: None   Collection Time: 07/21/23 11:47 AM   Specimen: Nasal Mucosa; Nasal Swab  Result Value Ref Range Status   MRSA by PCR Next Gen NOT DETECTED NOT DETECTED Final    Comment: (NOTE) The GeneXpert MRSA Assay (FDA approved for NASAL specimens only), is one component of a comprehensive MRSA colonization surveillance program. It is not intended to diagnose MRSA infection nor to guide or monitor  treatment for MRSA infections. Test performance is not FDA approved in patients less than 48 years old. Performed at [facility], [address], [place], [place] [address]   MIC (1 Drug)-     Status: None   Collection Time: 07/25/23  2:06 PM  Result Value Ref Range Status   Min Inhibitory Conc (1 Drug) REFERT  Final    Comment: (NOTE) Please refer to the following specimen for additional lab results. Please refer to spec # [phone] for test result. Francee Nodal notified 07-28-2023 at 17:55 AM. Performed At: [facility] [address] [place], [place] [id number] Jolene Schimke MD [id number]    Source ECOL CESBL  Final    Comment: Performed at [facility], [address], [place], [place] [address]    Labs: CBC: Recent Labs  Lab 08/05/23 0455 08/09/23 0716  WBC 3.0* 3.0*  HGB 8.9* 9.6*  HCT  27.1* 29.9*  MCV 97.1 99.7  PLT 196 215   Basic Metabolic Panel: Recent Labs  Lab 08/05/23 0455 08/09/23 0716  NA 135 135  K 3.9 3.9  CL 108 106  CO2 23 23  GLUCOSE 105* 82  BUN 23* 24*  CREATININE 1.32* 1.30*  CALCIUM 8.4* 8.6*    Discharge time spent: 35 minutes.  Signed: Jacquelin Hawking, MD [facility] 08/10/2023

## 2023-08-16 ENCOUNTER — Inpatient Hospital Stay: Payer: Self-pay | Admitting: Physician Assistant

## 2023-08-16 NOTE — Progress Notes (Deleted)
Patient ID: Jacob Gutierrez, male   DOB: [birth year], 38 y.o.   MRN: [id number]    After hospitalization 07/20/2023-08/10/2023  Recommendations at discharge:  PCP visit for establishment of care and hospital follow-up Urology follow-up for management of prostate bladder communication ID follow-up for continued management of HIV   Discharge Diagnoses: Principal Problem:   UTI due to extended-spectrum beta lactamase (ESBL) producing Escherichia coli Active Problems:   Sepsis due to Escherichia coli (E. 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 *   Hospital Course: Jacob Gutierrez is a 38 y.o. male with a history of HIV/AIDS, medication nonadherence, substance abuse, pulmonary coccidiomycosis, syphilis, gonorrhea, monkeypox, ESBL E. coli UTI, hypertension.  Patient presented secondary to dysuria, fevers and chills and was found to have evidence of sepsis secondary to UTI.  Urine culture grew ESBL E. coli complicating treatment.  Patient has been managed on ertapenem for treatment.  Overall, patient is improving.  During hospitalization, patient was found to have evidence of a prostate bladder communication requiring urology consultation with ultimate recommendations for follow-up at [facility] for urologic evaluation.  Patient to be discharged on omadacycline going outpatient treatment of ESBL UTI.   Assessment and Plan:   UTI Secondary to ESBL E. Coli. Patient treated with ertapenem. Unable to treat with IV antibiotics secondary to homelessness. Patient with plan to transition to omadacycline on discharge to complete a 2 week course. Patient to follow-up with  ID.   Prostate bladder communication Urology consulted. Recommendation for follow-up with urology at [facility]. Information for follow-up placed in chart.   Sepsis Present on admission. Secondary to UTI. Blood cultures negative. Physiology improved with treatment of UTI   HIV infection AIDS CD4 count is undetectable.  HIV quant of 1.18 million.  Secondary to medication non-adherence. Symtuza restarted. Dapsone started for prophylaxis. Bactrim held secondary to renal function.   Coronavirus infection Resolved.  Pancytopenia Secondary to acute illness. WBC improving. Hemoglobin stable. Thrombocytopenia resolved.   Homelessness Noted. TOC involved. Patient with plan to live in a hotel.   History of syphilis Previously treated with decrease in titer.   Moderate malnutrition Noted. Continue protein supplementation.

## 2023-08-28 ENCOUNTER — Other Ambulatory Visit (HOSPITAL_COMMUNITY): Payer: Self-pay

## 2023-09-01 ENCOUNTER — Telehealth: Payer: Self-pay

## 2023-09-01 NOTE — Telephone Encounter (Signed)
 Brad with [facility] called to discuss the patient.  He is aware you are currently out of the office.  Please give him a call at your earliest convenience.   Phone# [phone]

## 2023-09-10 ENCOUNTER — Other Ambulatory Visit (HOSPITAL_COMMUNITY): Payer: Self-pay

## 2023-09-11 ENCOUNTER — Other Ambulatory Visit (HOSPITAL_COMMUNITY): Payer: Self-pay

## 2023-09-11 ENCOUNTER — Other Ambulatory Visit: Payer: Self-pay | Admitting: Family

## 2023-09-11 ENCOUNTER — Other Ambulatory Visit: Payer: Self-pay

## 2023-09-11 ENCOUNTER — Encounter: Payer: Self-pay | Admitting: Family

## 2023-09-11 ENCOUNTER — Ambulatory Visit (INDEPENDENT_AMBULATORY_CARE_PROVIDER_SITE_OTHER): Payer: 59 | Admitting: Family

## 2023-09-11 VITALS — BP 99/68 | HR 101 | Temp 97.9°F | Resp 16 | Wt 192.8 lb

## 2023-09-11 DIAGNOSIS — B20 Human immunodeficiency virus [HIV] disease: Secondary | ICD-10-CM | POA: Diagnosis not present

## 2023-09-11 DIAGNOSIS — Z0000 Encounter for general adult medical examination without abnormal findings: Secondary | ICD-10-CM

## 2023-09-11 DIAGNOSIS — N429 Disorder of prostate, unspecified: Secondary | ICD-10-CM

## 2023-09-11 MED ORDER — SYMTUZA 800-150-200-10 MG PO TABS: 1.0000 | ORAL_TABLET | Freq: Every day | ORAL | 3 refills | Status: DC

## 2023-09-11 MED ORDER — ENSURE ENLIVE PO LIQD: 237.0000 mL | Freq: Three times a day (TID) | ORAL | 0 refills | Status: DC

## 2023-09-11 MED ORDER — DAPSONE 100 MG PO TABS: 100.0000 mg | ORAL_TABLET | Freq: Every day | ORAL | 3 refills | Status: DC

## 2023-09-11 MED ORDER — TAMSULOSIN HCL 0.4 MG PO CAPS: 0.4000 mg | ORAL_CAPSULE | Freq: Every day | ORAL | 0 refills | Status: DC

## 2023-09-11 NOTE — Assessment & Plan Note (Signed)
Jacob Gutierrez has improved adherence and good tolerance to Symtuza supplemented with dapsone for OI prophylaxis. Reviewed previous lab work and discussed plan of care. Counseled on importance of taking medications daily and routine follow up to reduce risk of complications and further progression of disease as he is already immunocompromised with CD <35. Check blood work. Plan for follow up in 1 month or sooner if needed with lab work on the same day.

## 2023-09-11 NOTE — Patient Instructions (Signed)
Nice to see you.  We will check your lab work today.  Continue to take your medication daily as prescribed.  Refills have been sent to the pharmacy.  Plan for follow up in 1 months or sooner if needed with lab work on the same day.  Have a great day and stay safe!  

## 2023-09-11 NOTE — Assessment & Plan Note (Signed)
M

## 2023-09-11 NOTE — Assessment & Plan Note (Signed)
Discussed importance of safe sexual practice and condom use. Condoms and site specific STD testing offered.  Vaccinations reviewed and declined.  Due for routine dental care. Awaiting disposition of here versus [place] to determine care.

## 2023-09-11 NOTE — Assessment & Plan Note (Signed)
Likely prostatic abscess with ESBL E. Coli that is resistant to Ciprofloxacin. [facility] recommending cystoscopy and abscess drainage with culture directed antibiotic care and control of HIV disease. Intervention could be done here in [place] or at [facility]. Will reach to Urology Team to determine next steps and planning of procedure. No signs of infection currently although continued to have urinary symptoms and will continue with current dose of Flomax while awaiting direction of intervention.

## 2023-09-11 NOTE — Progress Notes (Signed)
Brief Narrative   Patient ID: Jacob Gutierrez, male    DOB: 02-20-1986, 38 y.o.   MRN: [id number]  Mr. Dolata is a 38 year-old African-American male diagnosed with HIV-1 disease in 2013 with risk factor of MSM.  Initial CD4 nadir of 60.  Previous history of gonorrhea, pulmonary cocciodomycosis.  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  Previous ART regimen include Stribild with Prezista and now Symtuza.   Subjective:    Chief Complaint  Patient presents with   Follow-up    B20     HPI:  Jacob Gutierrez is a 38 y.o. male with AIDS last seen during hospitalization for prostatic fluid collection with ESBL E. Coli on 08/10/23 with plan for 2 weeks of omadacyline and follow up with [facility]. HIV was poorly controlled with less than optimal adherence to Symtuza with viral load 1.18 million and CD4 count <35. Restarted on Symtuza and dapsone. Seen by Dr. Guy Sandifer at [facility] with recommendation for cystoscopy and drainage of abscess with culture directed antibiotic therapy along with control of HIV disease. Cystoscopy could be performed at either [facility] or in [place]. Here today for hospital follow up.  Neko has been doing okay since discharge and completed the course of omadacycline with no adverse side effects and has not been on any antibiotics since that time. Having less incontinence with continued pressure and feelings of spasms with urinating. Has not been taking Flomax. Continues to take Symtuza and dapsone as prescribed with no adverse side effects. Has concerns about the next steps for treatment of his likely prostate abscess. Condoms and STD testing offered. Needing refill of Ensure. Healthcare maintenance reviewed with vaccinations declined.   Denies fevers, chills, night sweats, headaches, changes in vision, neck pain/stiffness, nausea, diarrhea, vomiting, lesions or rashes.  Lab Results  Component Value Date   CD4TCELL 2 (L)  07/20/2023   CD4TABS <35 (L) 07/20/2023   Lab Results  Component Value Date   HIV1RNAQUANT 1,180,000 07/20/2023     Allergies  Allergen Reactions   Vancomycin Anaphylaxis, Itching, Swelling and Other (See Comments)    Angioedema and "everything swells"   Amoxicillin Other (See Comments)    From childhood: "I had a reaction when i was little." (??) Has tolerated multiple cephalosporins in the past      Outpatient Medications Prior to Visit  Medication Sig Dispense Refill   pantoprazole (PROTONIX) 40 MG tablet Take 40 mg by mouth daily.     dapsone 100 MG tablet Take 1 tablet (100 mg total) by mouth daily. 30 tablet 0   Darunavir-Cobicistat-Emtricitabine-Tenofovir Alafenamide (SYMTUZA) 800-150-200-10 MG TABS Take 1 tablet by mouth daily with breakfast. 90 tablet 0   tamsulosin (FLOMAX) 0.4 MG CAPS capsule Take 1 capsule (0.4 mg total) by mouth daily after supper. 30 capsule 0   acetaminophen (TYLENOL) 500 MG tablet Take 1 tablet (500 mg total) by mouth every 6 (six) hours as needed. (Patient not taking: Reported on 09/11/2023) 30 tablet 0   Multiple Vitamin (MULTIVITAMIN WITH MINERALS) TABS tablet Take 1 tablet by mouth daily. (Patient not taking: Reported on 09/11/2023) 90 tablet 0   sodium bicarbonate 650 MG tablet Take 1,300 mg by mouth 2 (two) times daily. (Patient not taking: Reported on 09/11/2023)     feeding supplement (ENSURE ENLIVE / ENSURE PLUS) LIQD Take 237 mLs by mouth 3 (three) times daily between meals. (Patient not taking: Reported on 09/11/2023) 21330 mL 0   No  facility-administered medications prior to visit.     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   ESBL (extended spectrum beta-lactamase) producing bacteria infection    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 [facility])    H/O coccidioidomycosis    pulmonary    HIV disease (HCC)     Human monkeypox    Past history of allergy to penicillin-type antibiotic 07/03/2012   desensitization   PNA (pneumonia)    Shigella gastroenteritis    Syphilis    history /treated    Syphilis, unspecified 09/12/2016     Past Surgical History:  Procedure Laterality Date   BIOPSY  10/16/2020   Procedure: BIOPSY;  Surgeon: Lemar Lofty., MD;  Location: [facility] ENDOSCOPY;  Service: Gastroenterology;;   BIOPSY  10/23/2020   Procedure: BIOPSY;  Surgeon: Lynann Bologna, MD;  Location: [facility] ENDOSCOPY;  Service: Endoscopy;;   COLONOSCOPY WITH PROPOFOL N/A 10/23/2020   Procedure: COLONOSCOPY WITH PROPOFOL;  Surgeon: Lynann Bologna, MD;  Location: [facility] ENDOSCOPY;  Service: Endoscopy;  Laterality: N/A;   ESOPHAGOGASTRODUODENOSCOPY (EGD) WITH PROPOFOL N/A 10/16/2020   Procedure: ESOPHAGOGASTRODUODENOSCOPY (EGD) WITH PROPOFOL;  Surgeon: Meridee Score Netty Starring., MD;  Location: [facility] ENDOSCOPY;  Service: Gastroenterology;  Laterality: N/A;   TRANSURETHRAL RESECTION OF PROSTATE N/A 12/09/2017   Procedure: TRANSURETHRAL RESECTION OF THE PROSTATE (TURP);  Surgeon: Crist Fat, MD;  Location: [facility] ORS;  Service: Urology;  Laterality: N/A;      Review of Systems  Constitutional:  Negative for appetite change, chills, fatigue, fever and unexpected weight change.  Eyes:  Negative for visual disturbance.  Respiratory:  Negative for cough, chest tightness, shortness of breath and wheezing.   Cardiovascular:  Negative for chest pain and leg swelling.  Gastrointestinal:  Negative for abdominal pain, constipation, diarrhea, nausea and vomiting.  Genitourinary:  Positive for decreased urine volume. Negative for dysuria, flank pain, frequency, genital sores, hematuria and urgency.  Skin:  Negative for rash.  Allergic/Immunologic: Negative for immunocompromised state.  Neurological:  Negative for dizziness and headaches.      Objective:    BP 99/68   Pulse (!) 101   Temp 97.9 F (36.6 C) (Oral)   Resp 16    Wt 192 lb 12.8 oz (87.5 kg)   SpO2 96%   BMI 25.44 kg/m  Nursing note and vital signs reviewed.  Physical Exam Constitutional:      General: He is not in acute distress.    Appearance: He is well-developed.  Eyes:     Conjunctiva/sclera: Conjunctivae normal.  Cardiovascular:     Rate and Rhythm: Normal rate and regular rhythm.     Heart sounds: Normal heart sounds. No murmur heard.    No friction rub. No gallop.  Pulmonary:     Effort: Pulmonary effort is normal. No respiratory distress.     Breath sounds: Normal breath sounds. No wheezing or rales.  Chest:     Chest wall: No tenderness.  Abdominal:     General: Bowel sounds are normal.     Palpations: Abdomen is soft.     Tenderness: There is no abdominal tenderness.  Musculoskeletal:     Cervical back: Neck supple.  Lymphadenopathy:     Cervical: No cervical adenopathy.  Skin:    General: Skin is warm and dry.     Findings: No rash.  Neurological:     Mental Status: He is alert and  oriented to person, place, and time.  Psychiatric:        Behavior: Behavior normal.        Thought Content: Thought content normal.        Judgment: Judgment normal.         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 coli) (HCC) 03/04/2021   GERD  (gastroesophageal reflux disease) 03/04/2021   Nonadherence to medical treatment    AKI (acute kidney injury) (HCC) 02/10/2021   Loose stools    Coronavirus infection 08/25/2020   Healthcare maintenance 07/23/2020   Pancytopenia (HCC) 09/20/2018   Avoidance coping 09/07/2018   UTI due to extended-spectrum beta lactamase (ESBL) producing Escherichia coli 07/19/2018   Headache 04/27/2018   Condyloma 04/24/2018   Protein-calorie malnutrition, severe 12/12/2017   Personal history of MRSA (methicillin resistant Staphylococcus aureus) 12/09/2017   History of ESBL E. 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 Coli that is resistant to Ciprofloxacin. [facility] recommending cystoscopy and abscess drainage with culture directed antibiotic care and control of HIV disease. Intervention could be done here in [place] or at [facility]. Will reach to Urology Team to determine next steps and planning of procedure. No signs of infection currently although continued to have urinary symptoms and will continue with current dose of Flomax while awaiting direction of intervention.         Other   AIDS (acquired immune deficiency syndrome) (HCC) - Primary   Neko has improved adherence and good tolerance to Symtuza supplemented with dapsone for OI prophylaxis. Reviewed previous lab work and discussed plan of care. Counseled on importance of taking medications daily and routine follow up to reduce risk of complications and further progression of disease as he is already immunocompromised with CD <35. Check blood work. Plan for follow up in 1 month or sooner if needed with  lab work on the same day.       Relevant Medications   Darunavir-Cobicistat-Emtricitabine-Tenofovir Alafenamide (SYMTUZA) 800-150-200-10 MG TABS   dapsone 100 MG tablet   Other Relevant Orders   COMPLETE METABOLIC PANEL WITH GFR   HIV-1 RNA quant-no reflex-bld   T-helper cell (CD4)- ([facility] clinic only)   Healthcare maintenance   Discussed importance of safe sexual practice and condom use. Condoms and site specific  STD testing offered.  Vaccinations reviewed and declined.  Due for routine dental care. Awaiting disposition of here versus [place] to determine care.         I am having Lillie Columbia "Neko" maintain his acetaminophen, pantoprazole, sodium bicarbonate, multivitamin with minerals, Symtuza, dapsone, tamsulosin, and feeding supplement.   Meds ordered this encounter  Medications   Darunavir-Cobicistat-Emtricitabine-Tenofovir Alafenamide (SYMTUZA) 800-150-200-10 MG TABS    Sig: Take 1 tablet by mouth daily with breakfast.    Dispense:  30 tablet    Refill:  3    Supervising Provider:   Judyann Munson [phone]    Prescription Type::   Renewal   dapsone 100 MG tablet    Sig: Take 1 tablet (100 mg total) by mouth daily.    Dispense:  30 tablet    Refill:  3    Supervising Provider:   Judyann Munson [[id number]]   tamsulosin (FLOMAX) 0.4 MG CAPS capsule    Sig: Take 1 capsule (0.4 mg total) by mouth daily after supper.    Dispense:  30 capsule    Refill:  0    Supervising Provider:   Drue Second, CYNTHIA [[id number]]   feeding supplement (ENSURE ENLIVE / ENSURE PLUS) LIQD    Sig: Take 237 mLs by mouth 3 (three) times daily between meals.    Dispense:  21330 mL    Refill:  0    Supervising Provider:   Judyann Munson [[id number]]     Follow-up: Return in about 1 month (around 10/09/2023). or sooner if needed.    Marcos Eke, MSN, FNP-C Nurse Practitioner [facility] Main number: [phone]

## 2023-09-12 LAB — T-HELPER CELL (CD4) - (RCID CLINIC ONLY)
CD4 % Helper T Cell: 2 % — ABNORMAL LOW (ref 33–65)
CD4 T Cell Abs: <35 /uL — ABNORMAL LOW (ref 400–1790)

## 2023-09-13 LAB — COMPLETE METABOLIC PANEL WITH GFR
AG Ratio: 0.6 (calc) — ABNORMAL LOW (ref 1.0–2.5)
ALT: 13 U/L (ref 9–46)
AST: 17 U/L (ref 10–40)
Albumin: 3.4 g/dL — ABNORMAL LOW (ref 3.6–5.1)
Alkaline phosphatase (APISO): 66 U/L (ref 36–130)
BUN/Creatinine Ratio: 11 (calc) (ref 6–22)
BUN: 15 mg/dL (ref 7–25)
CO2: 24 mmol/L (ref 20–32)
Calcium: 8.5 mg/dL — ABNORMAL LOW (ref 8.6–10.3)
Chloride: 102 mmol/L (ref 98–110)
Creat: 1.34 mg/dL — ABNORMAL HIGH (ref 0.60–1.29)
Globulin: 5.4 g/dL — ABNORMAL HIGH (ref 1.9–3.7)
Glucose, Bld: 104 mg/dL — ABNORMAL HIGH (ref 65–99)
Potassium: 3.5 mmol/L (ref 3.5–5.3)
Sodium: 133 mmol/L — ABNORMAL LOW (ref 135–146)
Total Bilirubin: 0.4 mg/dL (ref 0.2–1.2)
Total Protein: 8.8 g/dL — ABNORMAL HIGH (ref 6.1–8.1)
eGFR: 69 mL/min/{1.73_m2} (ref >=60)

## 2023-09-13 LAB — HIV-1 RNA QUANT-NO REFLEX-BLD
HIV 1 RNA Quant: 7360 {copies}/mL — ABNORMAL HIGH
HIV-1 RNA Quant, Log: 3.87 {Log} — ABNORMAL HIGH

## 2023-09-14 NOTE — Telephone Encounter (Signed)
Spoke with [employer], they state they did not receive the prescription and that patient would need to purchase it over the counter.   Sandie Ano, RN

## 2023-10-06 ENCOUNTER — Other Ambulatory Visit: Payer: Self-pay | Admitting: Urology

## 2023-10-08 ENCOUNTER — Other Ambulatory Visit: Payer: Self-pay | Admitting: Family

## 2023-10-11 ENCOUNTER — Ambulatory Visit: Payer: 59 | Admitting: Family

## 2023-10-16 ENCOUNTER — Telehealth: Payer: Self-pay

## 2023-10-16 NOTE — Telephone Encounter (Signed)
 Surgical clearance faxed to [facility] signed by [name], NP. Copy placed in scan and in triage folder.  Sumiko Ceasar Jonathon Resides, CMA

## 2023-10-20 ENCOUNTER — Telehealth: Payer: Self-pay

## 2023-10-20 NOTE — Telephone Encounter (Signed)
 Detectable Viral Load Intervention (DVL)  Most recent VL:  HIV 1 RNA Quant  Date Value Ref Range Status  09/11/2023 7,360 (H) Copies/mL Final  07/20/2023 1,180,000 copies/mL Corrected    Comment:    (NOTE) The reportable range for this assay is 20 to 10,000,000 copies HIV-1 RNA/mL.   10/18/2022 447,000 (H) copies/mL Final    Last Clinic Visit: 09/11/23  Current ART regimen: Symtuza  Appointment status: patient does not have future appointment scheduled   Medication last dispensed (per chart review):   Dispensed Days Supply Quantity Provider Pharmacy  [facility] TABLETS 09/11/2023 30 30 each Veryl Speak, FNP [facility]...  Darunavir-Cobicistat-Emtricitabine-Tenofovir Alafenamide (SYMTUZA) 800-150-200-10 MG TABS 08/10/2023 30 30 tablet Narda Bonds, MD [place] [facility]...    Medication Adherence   What pharmacy do you use for your ART? [facility]  Do you pick up your medication at the pharmacy or is it mailed to you? pick up at pharmacy  How often do you miss a dose your ART?  Are you experiencing any side effects with your ART?   Are you having any trouble remembering what medication(s) you are supposed to take or how you are supposed to take them?   What helps you remember to take your medication(s)?    Barriers to Care   Lack of transportation to medical appointments?   2. Housing instability?   3. If you are currently employed, are you having difficulty taking time off of work for medical appointments?   4. Financial concerns (rent, utilities, etc.)   5. Lack of consistent access to food?   6. Trouble remembering and attending your appointments?   7. Are you experiencing any other barriers that make it hard for you to come to appointments or take medication regularly?    Interventions   Called patient to discuss medication adherence and possible barriers to care. Not able to reach at this time. Left vm requesting call back to  reschedule missed appt.  Juanita Laster, RMA

## 2023-10-27 NOTE — Patient Instructions (Signed)
 SURGICAL WAITING ROOM VISITATION  Patients having surgery or a procedure may have no more than 2 support people in the waiting area - these visitors may rotate.    Children under the age of 47 must have an adult with them who is not the patient.  Due to an increase in RSV and influenza rates and associated hospitalizations, children ages 17 and under may not visit patients in [facility] hospitals.  Visitors with respiratory illnesses are discouraged from visiting and should remain at home.  If the patient needs to stay at the hospital during part of their recovery, the visitor guidelines for inpatient rooms apply. Pre-op nurse will coordinate an appropriate time for 1 support person to accompany patient in pre-op.  This support person may not rotate.    Please refer to the [facility] website for the visitor guidelines for Inpatients (after your surgery is over and you are in a regular room).       Your procedure is scheduled on:  11/07/2023    Report to [facility] Main Entrance    Report to admitting at  0945 AM   Call this number if you have problems the morning of surgery [phone]   Do not eat food  or drink liquids :After Midnight.                 If you have questions, please contact your surgeon's office.      Oral Hygiene is also important to reduce your risk of infection.                                    Remember - BRUSH YOUR TEETH THE MORNING OF SURGERY WITH YOUR REGULAR TOOTHPASTE  DENTURES WILL BE REMOVED PRIOR TO SURGERY PLEASE DO NOT APPLY "Poly grip" OR ADHESIVES!!!   Do NOT smoke after Midnight   Stop all vitamins and herbal supplements 7 days before surgery.   Take these medicines the morning of surgery with A SIP OF WATER:  protonix   DO NOT TAKE ANY ORAL DIABETIC MEDICATIONS DAY OF YOUR SURGERY  Bring CPAP mask and tubing day of surgery.                              You may not have any metal on your body including hair pins,  jewelry, and body piercing             Do not wear make-up, lotions, powders, perfumes/cologne, or deodorant  Do not wear nail polish including gel and S&S, artificial/acrylic nails, or any other type of covering on natural nails including finger and toenails. If you have artificial nails, gel coating, etc. that needs to be removed by a nail salon please have this removed prior to surgery or surgery may need to be canceled/ delayed if the surgeon/ anesthesia feels like they are unable to be safely monitored.   Do not shave  48 hours prior to surgery.               Men may shave face and neck.   Do not bring valuables to the hospital. [place] IS NOT             RESPONSIBLE   FOR VALUABLES.   Contacts, glasses, dentures or bridgework may not be worn into surgery.   Bring small overnight bag day of  surgery.   DO NOT BRING YOUR HOME MEDICATIONS TO THE HOSPITAL. PHARMACY WILL DISPENSE MEDICATIONS LISTED ON YOUR MEDICATION LIST TO YOU DURING YOUR ADMISSION IN THE HOSPITAL!    Patients discharged on the day of surgery will not be allowed to drive home.  Someone NEEDS to stay with you for the first 24 hours after anesthesia.   Special Instructions: Bring a copy of your healthcare power of attorney and living will documents the day of surgery if you haven't scanned them before.              Please read over the following fact sheets you were given: IF YOU HAVE QUESTIONS ABOUT YOUR PRE-OP INSTRUCTIONS PLEASE CALL [phone]   If you received a COVID test during your pre-op visit  it is requested that you wear a mask when out in public, stay away from anyone that may not be feeling well and notify your surgeon if you develop symptoms. If you test positive for Covid or have been in contact with anyone that has tested positive in the last 10 days please notify you surgeon.    [place] - Preparing for Surgery Before surgery, you can play an important role.  Because skin is not sterile, your  skin needs to be as free of germs as possible.  You can reduce the number of germs on your skin by washing with CHG (chlorahexidine gluconate) soap before surgery.  CHG is an antiseptic cleaner which kills germs and bonds with the skin to continue killing germs even after washing. Please DO NOT use if you have an allergy to CHG or antibacterial soaps.  If your skin becomes reddened/irritated stop using the CHG and inform your nurse when you arrive at Short Stay. Do not shave (including legs and underarms) for at least 48 hours prior to the first CHG shower.  You may shave your face/neck. Please follow these instructions carefully:  1.  Shower with CHG Soap the night before surgery and the  morning of Surgery.  2.  If you choose to wash your hair, wash your hair first as usual with your  normal  shampoo.  3.  After you shampoo, rinse your hair and body thoroughly to remove the  shampoo.                           4.  Use CHG as you would any other liquid soap.  You can apply chg directly  to the skin and wash                       Gently with a scrungie or clean washcloth.  5.  Apply the CHG Soap to your body ONLY FROM THE NECK DOWN.   Do not use on face/ open                           Wound or open sores. Avoid contact with eyes, ears mouth and genitals (private parts).                       Wash face,  Genitals (private parts) with your normal soap.             6.  Wash thoroughly, paying special attention to the area where your surgery  will be performed.  7.  Thoroughly rinse your body with warm water from the neck  down.  8.  DO NOT shower/wash with your normal soap after using and rinsing off  the CHG Soap.                9.  Pat yourself dry with a clean towel.            10.  Wear clean pajamas.            11.  Place clean sheets on your bed the night of your first shower and do not  sleep with pets. Day of Surgery : Do not apply any lotions/deodorants the morning of surgery.  Please wear  clean clothes to the hospital/surgery center.  FAILURE TO FOLLOW THESE INSTRUCTIONS MAY RESULT IN THE CANCELLATION OF YOUR SURGERY PATIENT SIGNATURE_________________________________  NURSE SIGNATURE__________________________________  ________________________________________________________________________

## 2023-10-27 NOTE — Progress Notes (Addendum)
 Anesthesia Review:  PCP: Verlene Mayer LOV 09/11/23 - note on chart  Along with Discharge Summary of 08/10/23.   Cardiologist : [facility]- Verlene Mayer LOV 09/11/23  [facility] to [facility] for Loney Laurence, Scheduler and requested clearance to be faxed.  Clearance dated 10/05/23 from Allene Dillon under Media Tab dated 10/31/23.   PPM/ ICD: Device Orders: Rep Notified:  Chest x-ray : 07/20/23- 2 view  CT Chest- 07/24/23  EKG : 07/21/23  Echo : Stress test: Cardiac Cath :   Activity level: can do a flight of stairs without difficutly  Sleep Study/ CPAP : none  Fasting Blood Sugar :      / Checks Blood Sugar -- times a day:    Blood Thinner/ Instructions /Last Dose: ASA / Instructions/ Last Dose :    Positive  RSV on 05/2023  HIV pos with hx of monkey pox, ESBL    PT usually takes am meds with food.  PT will take after surgery when able to eat.

## 2023-10-31 ENCOUNTER — Encounter (HOSPITAL_COMMUNITY): Payer: Self-pay

## 2023-10-31 ENCOUNTER — Other Ambulatory Visit: Payer: Self-pay

## 2023-10-31 ENCOUNTER — Encounter (HOSPITAL_COMMUNITY)
Admission: RE | Admit: 2023-10-31 | Discharge: 2023-10-31 | Disposition: A | Discharge status: Home / Self Care | Source: Ambulatory Visit | Attending: Urology | Admitting: Urology

## 2023-10-31 VITALS — BP 127/85 | HR 89 | Temp 98.2°F | Resp 16 | Ht 73.0 in | Wt 193.0 lb

## 2023-10-31 DIAGNOSIS — Z01812 Encounter for preprocedural laboratory examination: Secondary | ICD-10-CM | POA: Insufficient documentation

## 2023-10-31 DIAGNOSIS — Z01818 Encounter for other preprocedural examination: Secondary | ICD-10-CM

## 2023-10-31 HISTORY — DX: Unspecified osteoarthritis, unspecified site: M19.90

## 2023-10-31 HISTORY — DX: Gastro-esophageal reflux disease without esophagitis: K21.9

## 2023-10-31 HISTORY — DX: Cerebral infarction, unspecified: I63.9

## 2023-10-31 LAB — CBC
HCT: 34.7 % — ABNORMAL LOW (ref 39.0–52.0)
Hemoglobin: 11.2 g/dL — ABNORMAL LOW (ref 13.0–17.0)
MCH: 32.4 pg (ref 26.0–34.0)
MCHC: 32.3 g/dL (ref 30.0–36.0)
MCV: 100.3 fL — ABNORMAL HIGH (ref 80.0–100.0)
Platelets: 158 10*3/uL (ref 150–400)
RBC: 3.46 MIL/uL — ABNORMAL LOW (ref 4.22–5.81)
RDW: 13.9 % (ref 11.5–15.5)
WBC: 3.7 10*3/uL — ABNORMAL LOW (ref 4.0–10.5)
nRBC: 0 % (ref 0.0–0.2)

## 2023-10-31 LAB — BASIC METABOLIC PANEL WITH GFR
Anion gap: 7 (ref 5–15)
BUN: 16 mg/dL (ref 6–20)
CO2: 21 mmol/L — ABNORMAL LOW (ref 22–32)
Calcium: 8.1 mg/dL — ABNORMAL LOW (ref 8.9–10.3)
Chloride: 104 mmol/L (ref 98–111)
Creatinine, Ser: 1.4 mg/dL — ABNORMAL HIGH (ref 0.61–1.24)
GFR, Estimated: >60 mL/min (ref >60)
Glucose, Bld: 106 mg/dL — ABNORMAL HIGH (ref 70–99)
Potassium: 3.4 mmol/L — ABNORMAL LOW (ref 3.5–5.1)
Sodium: 132 mmol/L — ABNORMAL LOW (ref 135–145)

## 2023-11-07 ENCOUNTER — Ambulatory Visit (HOSPITAL_COMMUNITY): Payer: Self-pay | Admitting: Physician Assistant

## 2023-11-07 ENCOUNTER — Other Ambulatory Visit: Payer: Self-pay

## 2023-11-07 ENCOUNTER — Encounter (HOSPITAL_COMMUNITY)
Admission: AD | Disposition: A | Discharge status: Home / Self Care | Payer: Self-pay | Source: Home / Self Care | Attending: Urology

## 2023-11-07 ENCOUNTER — Inpatient Hospital Stay (HOSPITAL_COMMUNITY)
Admission: AD | Admit: 2023-11-07 | Discharge: 2023-11-15 | DRG: 713 | Disposition: A | Discharge status: Home / Self Care | Attending: Urology | Admitting: Urology

## 2023-11-07 ENCOUNTER — Ambulatory Visit (HOSPITAL_COMMUNITY)

## 2023-11-07 ENCOUNTER — Observation Stay (HOSPITAL_COMMUNITY)

## 2023-11-07 ENCOUNTER — Ambulatory Visit (HOSPITAL_COMMUNITY): Admitting: Anesthesiology

## 2023-11-07 ENCOUNTER — Encounter (HOSPITAL_COMMUNITY): Payer: Self-pay | Admitting: Urology

## 2023-11-07 DIAGNOSIS — Z8249 Family history of ischemic heart disease and other diseases of the circulatory system: Secondary | ICD-10-CM

## 2023-11-07 DIAGNOSIS — F17210 Nicotine dependence, cigarettes, uncomplicated: Secondary | ICD-10-CM | POA: Diagnosis not present

## 2023-11-07 DIAGNOSIS — Z8042 Family history of malignant neoplasm of prostate: Secondary | ICD-10-CM

## 2023-11-07 DIAGNOSIS — Z79899 Other long term (current) drug therapy: Secondary | ICD-10-CM

## 2023-11-07 DIAGNOSIS — K219 Gastro-esophageal reflux disease without esophagitis: Secondary | ICD-10-CM | POA: Diagnosis present

## 2023-11-07 DIAGNOSIS — B9620 Unspecified Escherichia coli [E. coli] as the cause of diseases classified elsewhere: Secondary | ICD-10-CM | POA: Diagnosis present

## 2023-11-07 DIAGNOSIS — Z8673 Personal history of transient ischemic attack (TIA), and cerebral infarction without residual deficits: Secondary | ICD-10-CM

## 2023-11-07 DIAGNOSIS — I10 Essential (primary) hypertension: Secondary | ICD-10-CM

## 2023-11-07 DIAGNOSIS — Z8269 Family history of other diseases of the musculoskeletal system and connective tissue: Secondary | ICD-10-CM

## 2023-11-07 DIAGNOSIS — Z833 Family history of diabetes mellitus: Secondary | ICD-10-CM

## 2023-11-07 DIAGNOSIS — N182 Chronic kidney disease, stage 2 (mild): Secondary | ICD-10-CM | POA: Diagnosis present

## 2023-11-07 DIAGNOSIS — M1990 Unspecified osteoarthritis, unspecified site: Secondary | ICD-10-CM | POA: Diagnosis present

## 2023-11-07 DIAGNOSIS — N412 Abscess of prostate: Secondary | ICD-10-CM

## 2023-11-07 DIAGNOSIS — N136 Pyonephrosis: Secondary | ICD-10-CM | POA: Diagnosis present

## 2023-11-07 DIAGNOSIS — Z8719 Personal history of other diseases of the digestive system: Secondary | ICD-10-CM

## 2023-11-07 DIAGNOSIS — Z881 Allergy status to other antibiotic agents status: Secondary | ICD-10-CM

## 2023-11-07 DIAGNOSIS — Z8616 Personal history of COVID-19: Secondary | ICD-10-CM

## 2023-11-07 DIAGNOSIS — Z91148 Patient's other noncompliance with medication regimen for other reason: Secondary | ICD-10-CM

## 2023-11-07 DIAGNOSIS — Z87440 Personal history of urinary (tract) infections: Secondary | ICD-10-CM

## 2023-11-07 DIAGNOSIS — D696 Thrombocytopenia, unspecified: Secondary | ICD-10-CM | POA: Diagnosis present

## 2023-11-07 DIAGNOSIS — D62 Acute posthemorrhagic anemia: Secondary | ICD-10-CM | POA: Diagnosis not present

## 2023-11-07 DIAGNOSIS — Z8619 Personal history of other infectious and parasitic diseases: Secondary | ICD-10-CM

## 2023-11-07 DIAGNOSIS — N179 Acute kidney failure, unspecified: Secondary | ICD-10-CM | POA: Diagnosis present

## 2023-11-07 DIAGNOSIS — F17290 Nicotine dependence, other tobacco product, uncomplicated: Secondary | ICD-10-CM | POA: Diagnosis present

## 2023-11-07 DIAGNOSIS — B20 Human immunodeficiency virus [HIV] disease: Secondary | ICD-10-CM | POA: Diagnosis present

## 2023-11-07 DIAGNOSIS — I129 Hypertensive chronic kidney disease with stage 1 through stage 4 chronic kidney disease, or unspecified chronic kidney disease: Secondary | ICD-10-CM | POA: Diagnosis present

## 2023-11-07 DIAGNOSIS — Z9079 Acquired absence of other genital organ(s): Secondary | ICD-10-CM

## 2023-11-07 DIAGNOSIS — Z01818 Encounter for other preprocedural examination: Secondary | ICD-10-CM

## 2023-11-07 DIAGNOSIS — Z809 Family history of malignant neoplasm, unspecified: Secondary | ICD-10-CM

## 2023-11-07 DIAGNOSIS — Z87892 Personal history of anaphylaxis: Secondary | ICD-10-CM

## 2023-11-07 DIAGNOSIS — Z880 Allergy status to penicillin: Secondary | ICD-10-CM

## 2023-11-07 DIAGNOSIS — N3020 Other chronic cystitis without hematuria: Secondary | ICD-10-CM | POA: Diagnosis present

## 2023-11-07 DIAGNOSIS — N2889 Other specified disorders of kidney and ureter: Secondary | ICD-10-CM | POA: Diagnosis present

## 2023-11-07 HISTORY — PX: TRANSURETHRAL RESECTION OF PROSTATE: SHX73

## 2023-11-07 LAB — PROTIME-INR
INR: 1.2 (ref 0.8–1.2)
Prothrombin Time: 15.4 s — ABNORMAL HIGH (ref 11.4–15.2)

## 2023-11-07 SURGERY — TURP (TRANSURETHRAL RESECTION OF PROSTATE)
Anesthesia: General | Site: Prostate

## 2023-11-07 MED ORDER — SODIUM CHLORIDE 0.9 % IV SOLN: 1.0000 g | Freq: Two times a day (BID) | INTRAVENOUS | Status: DC

## 2023-11-07 MED ORDER — MIDAZOLAM HCL 2 MG/2ML IJ SOLN
INTRAMUSCULAR | Status: AC
  Filled 2023-11-07: qty 2

## 2023-11-07 MED ORDER — SODIUM CHLORIDE 0.9 % IV SOLN
1.0000 g | Freq: Three times a day (TID) | INTRAVENOUS | Status: DC
  Filled 2023-11-07: qty 20

## 2023-11-07 MED ORDER — DARUN-COBIC-EMTRICIT-TENOFAF 800-150-200-10 MG PO TABS
1.0000 | ORAL_TABLET | Freq: Every day | ORAL | Status: DC
  Administered 2023-11-08 – 2023-11-15 (×8): 1 via ORAL
  Filled 2023-11-07 (×8): qty 1

## 2023-11-07 MED ORDER — BISACODYL 10 MG RE SUPP: 10.0000 mg | Freq: Every day | RECTAL | Status: DC | PRN

## 2023-11-07 MED ORDER — PROPOFOL 10 MG/ML IV BOLUS
INTRAVENOUS | Status: AC
  Filled 2023-11-07: qty 20

## 2023-11-07 MED ORDER — LACTATED RINGERS IV SOLN: INTRAVENOUS | Status: DC

## 2023-11-07 MED ORDER — HYDROMORPHONE HCL 1 MG/ML IJ SOLN
INTRAMUSCULAR | Status: AC
  Filled 2023-11-07: qty 1

## 2023-11-07 MED ORDER — HYDROMORPHONE HCL 1 MG/ML IJ SOLN
0.5000 mg | INTRAMUSCULAR | Status: DC | PRN
  Administered 2023-11-07: 1 mg via INTRAVENOUS
  Filled 2023-11-07: qty 1

## 2023-11-07 MED ORDER — ONDANSETRON HCL 4 MG/2ML IJ SOLN
INTRAMUSCULAR | Status: DC | PRN
  Administered 2023-11-07: 4 mg via INTRAVENOUS

## 2023-11-07 MED ORDER — DEXAMETHASONE SODIUM PHOSPHATE 10 MG/ML IJ SOLN
INTRAMUSCULAR | Status: DC | PRN
  Administered 2023-11-07: 10 mg via INTRAVENOUS

## 2023-11-07 MED ORDER — ZOLPIDEM TARTRATE 5 MG PO TABS: 5.0000 mg | ORAL_TABLET | Freq: Every evening | ORAL | Status: DC | PRN

## 2023-11-07 MED ORDER — ORAL CARE MOUTH RINSE: 15.0000 mL | Freq: Once | OROMUCOSAL | Status: AC

## 2023-11-07 MED ORDER — FENTANYL CITRATE (PF) 100 MCG/2ML IJ SOLN
INTRAMUSCULAR | Status: AC
  Filled 2023-11-07: qty 2

## 2023-11-07 MED ORDER — PROPOFOL 1000 MG/100ML IV EMUL
INTRAVENOUS | Status: AC
  Filled 2023-11-07: qty 200

## 2023-11-07 MED ORDER — IOHEXOL 300 MG/ML  SOLN
INTRAMUSCULAR | Status: DC | PRN
  Administered 2023-11-07: 3 mL

## 2023-11-07 MED ORDER — MIDAZOLAM HCL 5 MG/5ML IJ SOLN
INTRAMUSCULAR | Status: DC | PRN
  Administered 2023-11-07: 2 mg via INTRAVENOUS

## 2023-11-07 MED ORDER — SODIUM CHLORIDE 0.9 % IV SOLN
1.0000 g | Freq: Once | INTRAVENOUS | Status: AC
  Administered 2023-11-07: 1 g via INTRAVENOUS
  Filled 2023-11-07: qty 20

## 2023-11-07 MED ORDER — 0.9 % SODIUM CHLORIDE (POUR BTL) OPTIME
TOPICAL | Status: DC | PRN
  Administered 2023-11-07: 1000 mL

## 2023-11-07 MED ORDER — SENNOSIDES-DOCUSATE SODIUM 8.6-50 MG PO TABS: 1.0000 | ORAL_TABLET | Freq: Every evening | ORAL | Status: DC | PRN

## 2023-11-07 MED ORDER — DAPSONE 100 MG PO TABS
100.0000 mg | ORAL_TABLET | Freq: Every day | ORAL | Status: DC
  Administered 2023-11-08 – 2023-11-15 (×8): 100 mg via ORAL
  Filled 2023-11-07 (×8): qty 1

## 2023-11-07 MED ORDER — SODIUM CHLORIDE 0.9 % IV SOLN
1.0000 g | INTRAVENOUS | Status: DC
  Administered 2023-11-07 – 2023-11-14 (×8): 1 g via INTRAVENOUS
  Filled 2023-11-07 (×10): qty 1000

## 2023-11-07 MED ORDER — HYDROMORPHONE HCL 1 MG/ML IJ SOLN: 0.2500 mg | INTRAMUSCULAR | Status: DC | PRN

## 2023-11-07 MED ORDER — PROPOFOL 10 MG/ML IV BOLUS
INTRAVENOUS | Status: DC | PRN
  Administered 2023-11-07: 200 mg via INTRAVENOUS

## 2023-11-07 MED ORDER — HYDROMORPHONE HCL 1 MG/ML IJ SOLN
0.2500 mg | INTRAMUSCULAR | Status: DC | PRN
  Administered 2023-11-07 (×2): 0.5 mg via INTRAVENOUS

## 2023-11-07 MED ORDER — ONDANSETRON HCL 4 MG/2ML IJ SOLN: 4.0000 mg | INTRAMUSCULAR | Status: DC | PRN

## 2023-11-07 MED ORDER — LIDOCAINE HCL (CARDIAC) PF 100 MG/5ML IV SOSY
PREFILLED_SYRINGE | INTRAVENOUS | Status: DC | PRN
  Administered 2023-11-07: 50 mg via INTRAVENOUS

## 2023-11-07 MED ORDER — PANTOPRAZOLE SODIUM 40 MG PO TBEC
40.0000 mg | DELAYED_RELEASE_TABLET | Freq: Every day | ORAL | Status: DC
  Administered 2023-11-08 – 2023-11-15 (×8): 40 mg via ORAL
  Filled 2023-11-07 (×8): qty 1

## 2023-11-07 MED ORDER — TAMSULOSIN HCL 0.4 MG PO CAPS
0.4000 mg | ORAL_CAPSULE | Freq: Every day | ORAL | Status: DC
  Administered 2023-11-08 – 2023-11-15 (×8): 0.4 mg via ORAL
  Filled 2023-11-07 (×8): qty 1

## 2023-11-07 MED ORDER — FLEET ENEMA RE ENEM: 1.0000 | ENEMA | Freq: Once | RECTAL | Status: DC | PRN

## 2023-11-07 MED ORDER — OXYCODONE HCL 5 MG PO TABS: 5.0000 mg | ORAL_TABLET | ORAL | Status: DC | PRN

## 2023-11-07 MED ORDER — FENTANYL CITRATE (PF) 100 MCG/2ML IJ SOLN
INTRAMUSCULAR | Status: DC | PRN
  Administered 2023-11-07 (×2): 50 ug via INTRAVENOUS

## 2023-11-07 MED ORDER — SODIUM BICARBONATE 650 MG PO TABS
1300.0000 mg | ORAL_TABLET | Freq: Two times a day (BID) | ORAL | Status: DC
  Administered 2023-11-07 – 2023-11-15 (×16): 1300 mg via ORAL
  Filled 2023-11-07 (×16): qty 2

## 2023-11-07 MED ORDER — ACETAMINOPHEN 325 MG PO TABS: 650.0000 mg | ORAL_TABLET | ORAL | Status: DC | PRN

## 2023-11-07 MED ORDER — SODIUM CHLORIDE 0.9 % IR SOLN
Status: DC | PRN
  Administered 2023-11-07 (×3): 3000 mL

## 2023-11-07 MED ORDER — POTASSIUM CHLORIDE IN NACL 20-0.45 MEQ/L-% IV SOLN
INTRAVENOUS | Status: AC
  Filled 2023-11-07 (×3): qty 1000

## 2023-11-07 MED ORDER — PHENYLEPHRINE 80 MCG/ML (10ML) SYRINGE FOR IV PUSH (FOR BLOOD PRESSURE SUPPORT)
PREFILLED_SYRINGE | INTRAVENOUS | Status: DC | PRN
  Administered 2023-11-07 (×3): 80 ug via INTRAVENOUS

## 2023-11-07 MED ORDER — CHLORHEXIDINE GLUCONATE 0.12 % MT SOLN
15.0000 mL | Freq: Once | OROMUCOSAL | Status: AC
  Administered 2023-11-07: 15 mL via OROMUCOSAL

## 2023-11-07 SURGICAL SUPPLY — 24 items
BAG URINE DRAIN 2000ML AR STRL (UROLOGICAL SUPPLIES) IMPLANT
BAG URO CATCHER STRL LF (MISCELLANEOUS) ×1 IMPLANT
CATH FOLEY 3WAY 30CC 22FR (CATHETERS) IMPLANT
CATH HEMA 3WAY 30CC 22FR COUDE (CATHETERS) IMPLANT
DRAPE FOOT SWITCH (DRAPES) ×1 IMPLANT
ELECT HOOK LOOP BIPOLAR (NEEDLE) IMPLANT
ELECT REM PT RETURN 15FT ADLT (MISCELLANEOUS) ×1 IMPLANT
GLOVE SURG SS PI 8.0 STRL IVOR (GLOVE) IMPLANT
GOWN STRL SURGICAL XL XLNG (GOWN DISPOSABLE) ×1 IMPLANT
GUIDEWIRE ANG ZIPWIRE 038X150 (WIRE) IMPLANT
GUIDEWIRE STR DUAL SENSOR (WIRE) IMPLANT
GUIDEWIRE ZIPWRE .038 STRAIGHT (WIRE) IMPLANT
HOLDER FOLEY CATH W/STRAP (MISCELLANEOUS) IMPLANT
KIT TURNOVER KIT A (KITS) IMPLANT
LOOP CUT BIPOLAR 24F LRG (ELECTROSURGICAL) IMPLANT
MANIFOLD NEPTUNE II (INSTRUMENTS) ×1 IMPLANT
PACK CYSTO (CUSTOM PROCEDURE TRAY) ×1 IMPLANT
PAD PREP 24X48 CUFFED NSTRL (MISCELLANEOUS) ×1 IMPLANT
STENT URET 6FRX26 CONTOUR (STENTS) IMPLANT
SYR 30ML LL (SYRINGE) IMPLANT
SYR TOOMEY IRRIG 70ML (MISCELLANEOUS) IMPLANT
SYRINGE TOOMEY IRRIG 70ML (MISCELLANEOUS) IMPLANT
TUBING CONNECTING 10 (TUBING) ×1 IMPLANT
TUBING UROLOGY SET (TUBING) ×1 IMPLANT

## 2023-11-07 NOTE — Op Note (Signed)
 Procedure: 1.  Cystoscopy with bilateral retrograde pyelograms and interpretation. 2.  Right ureteral stent insertion. 3.  Attempted left ureteral stent insertion. 4.  Transurethral unroofing of chronic prostatic abscess. 5.  Application of fluoroscopy.  Preop diagnosis: Bilateral ureteral obstruction and chronic prostatic abscess.  Postop diagnosis: Same.  Surgeon: Dr. Bjorn Pippin.  Anesthesia: General.  Drain: 24 French three-way hematuria catheter.  EBL: 100 mL.  Specimen: Prostate chips.  Complications: Posterior prostatic perforation.  Indications: The patient is a 38 year old male with HIV with poor medication compliance who has a chronic prostatic abscess with urinary retention and bilateral hydronephrosis.  It is felt that the prostatic abscess may be causing obstruction.  Procedure: He was taken operating room was given meropenem.  A general anesthetic was induced.  He was placed in lithotomy position with fitted with PAS hose.  His perineum and genitalia were prepped with Betadine solution he was draped in usual sterile fashion.  Cystoscopy was performed using a 21 [place] scope and 30 degree lens.  Examination revealed a normal urethra.  The external sphincter was intact.  The verumontanum was prominent with evidence of prior resection widely patent prostatic urethra.  The bladder wall was smooth but there was marked inflammatory changes of the trigone and posterior wall with some flocculent material at the base that was irrigated out.  There was some periureteral edema but the orifices were otherwise unremarkable.  A 5 French open-ended catheter and Omnipaque was used to perform bilateral retrograde pyelography.  The right retrograde pyelogram demonstrated some tortuosity of the distal 2 to 3 cm of ureter with a tight stricture with proximal dilation with what appeared to be some mobile filling defects above the strictured area I was unable to get contrast more than about 6 cm  above the strictured area but the ureter was markedly dilated.  After the retrograde pyelogram an attempt was made to pass a wire first with a sensor wire again with an angled tip zip wire neither of these were successful.  The left retrograde pyelogram demonstrated less tortuosity of the distal ureter but a stricture approximately 2 to 3 cm above the meatus with dilation that was less prominent than on the right but also with some mobile filling defects within the lumen.  After left retrograde pyelogram I was able to successfully get a wire through the ureteral catheter to the kidney.  The open-ended catheter was removed and a 6 [place] by 26 cm contour double-J stent was passed without difficulty under fluoroscopic guidance.  The wire was moved calmly and good: The kidney and a good colon the bladder.  I then used the 6.5 [place] semirigid ureteroscope to try to pass the wire on the left but there was bleeding with clot that obscured the lumen and despite multiple attempts with both the sensor and angle-tip zip wire I was unable to get above the level of obstruction.  At this point the urethra is calibrated to 32 [place] with [employer] and a 26 French continuous-flow resectoscope sheath was inserted with the aid of the visual obturator.  This was fitted with a Wandra Scot handle with a bipolar loop and 30 degree lens.  Saline was used the irrigant.  I then resected the right bladder neck where the abscess had been seen on CT I was eventually down able to resect down to the abscess cavity however there was no significant purulent material within the cavity but it did extend proximal to the bladder neck up under the  trigone.  I did not see the distal lumen of the abscess which on CT scan extended down into the perineum however the prostate was very thin in this area and during the efforts to open the abscess resected through the posterior capsule and observed fat so I did not resect any deeper at that  point but did trim a little around the edges to to smooth the margins.  He had a few arterial bleeders that were cauterized and then had some venous bleeding that I felt I had cauterized sufficiently.  The bladder was then evacuated free of the few chips that I obtained and the scope was removed.  Initially a 22 French three-way Foley catheter was placed with the aid of a catheter guide.  The balloon was filled with 30 mL of sterile fluid and then irrigated but the irrigant was very very bloody so I removed the catheter and look back in and found 1 area of bleeding at the distal aspect of the resection on the right prostatic wall that I cauterized and at this point visualization revealed no significant bleeding.  At this point I put in a 24 [place] hematuria catheter and filled the balloon with 60 mL of fluid but even with retraction there was very very bloody return that was felt to be consistent with severe venous bleeding.  I reduced the fluid in the balloon to 30 mL with no improvement and then emptied the balloon and withdrew the catheter until the balloon was in the area of the prostatic urethra and reinflated with 30 mL.  With this maneuver the bleeding stopped and the catheter irrigated quantitatively without difficulty.  The irrigation port was plugged and the catheter was placed to straight drainage.  He was taken down from lithotomy position, his anesthetic was reversed and he was moved to recovery room in stable condition.

## 2023-11-07 NOTE — Anesthesia Postprocedure Evaluation (Signed)
 Anesthesia Post Note  Patient: Jacob Gutierrez  Procedure(s) Performed: TURP (TRANSURETHRAL RESECTION OF PROSTATE) (Prostate)     Patient location during evaluation: PACU Anesthesia Type: General Level of consciousness: awake and alert Pain management: pain level controlled Vital Signs Assessment: post-procedure vital signs reviewed and stable Respiratory status: spontaneous breathing, nonlabored ventilation and respiratory function stable Cardiovascular status: blood pressure returned to baseline and stable Postop Assessment: no apparent nausea or vomiting Anesthetic complications: no  No notable events documented.  Last Vitals:  Vitals:   11/07/23 1345 11/07/23 1400  BP: 112/77 111/75  Pulse: 84 85  Resp: 13 13  Temp:    SpO2: 98% 98%    Last Pain:  Vitals:   11/07/23 1400  TempSrc:   PainSc: 3                  Majed Pellegrin,W. EDMOND

## 2023-11-07 NOTE — H&P (Signed)
 3/12/25[name] has been seen by Dr. Guy Sandifer who feels he needs unroofing of the prostatic abscess cavity but the patient wanted it done here. His IPSS is 25 iwht frequency, urgency and nocturia with a slow stream. His Cr was 1.37 on 09/11/23.   GU hx: Jacob Gutierrez is a 38 yo male with HIV/AID with medical non-compliance who has been off of therapy for 5-6 months. He has a history of ESBL e. Coli UTI's and had a prostatic abscess unroofed by Dr. Marlou Porch in 2019. He had a perineal abscess drained by Dr. Vernie Ammons in 11/19. He was admit for UTI with sepsis in [place] a month ago and was admitted here with a 3-4 day history of sweats and fevers on 07/20/23. He has had ongoing voiding symptoms with hesitancy, dysuria and dark urine. His urine culture from 07/20/23 grew ESBL e. Coli that is being treated with ertapenem. He had a CT today that shows bilateral hydronephrosis with some bladder wall thickening and a fluid collection in the prostate extending posteriorly to the apex. This fluid collection was actually present on a prior CT from 5/23 but was not present in 10/22. His Cr. Is 1.29 which is down from 1.76 on admission but up from baseline of 0.94 in 9/24. He was last seen by [place] in 7/22 and had pyelonephritis with mild right hydroureteronephrosis to the bladder. He had a foley then but no stent. He tested positive for COVID on his respiratory panel. He is feeling much better on the antibiotics with decrease pain and voiding symptoms. PVR today is 8ml.     ALLERGIES: Amoxicillin Vancomycin    MEDICATIONS: Bactrim  Dapsone  Symtuza 800 mg-150 mg-200 mg-10 mg tablet     GU PSH: Cystoscopy TURP - 2019     NON-GU PSH: Drain Skin Abscess - 2019     GU PMH: Nocturia - 2020 Male ED, unspecified - 2019    NON-GU PMH: GERD HIV Stroke/TIA    FAMILY HISTORY: Diabetes - Father Hypertension - Father, Mother Prostate Cancer - Father   SOCIAL HISTORY: Marital Status: Single Preferred Language: English;  Ethnicity: Not Hispanic Or Latino; Race: Black or African American Current Smoking Status: Patient smokes occasionally.   Tobacco Use Assessment Completed: Used Tobacco in last 30 days? Has never drank.  Drinks 4+ caffeinated drinks per day.    REVIEW OF SYSTEMS:    GU Review Male:   Patient reports frequent urination, get up at night to urinate, and have to strain to urinate . Patient denies hard to postpone urination, burning/ pain with urination, leakage of urine, stream starts and stops, trouble starting your stream, erection problems, and penile pain.  Gastrointestinal (Upper):   Patient denies vomiting, indigestion/ heartburn, and nausea.  Gastrointestinal (Lower):   Patient denies diarrhea and constipation.  Constitutional:   He has gained some weight.  Patient denies fever, night sweats, weight loss, and fatigue.  Skin:   Patient denies skin rash/ lesion and itching.  Eyes:   Patient denies blurred vision and double vision.  Ears/ Nose/ Throat:   Patient denies sore throat and sinus problems.  Hematologic/Lymphatic:   Patient denies swollen glands and easy bruising.  Cardiovascular:   Patient denies leg swelling and chest pains.  Respiratory:   Patient denies cough and shortness of breath.  Endocrine:   Patient reports excessive thirst.   Musculoskeletal:   Patient denies back pain and joint pain.  Neurological:   Patient denies headaches and dizziness.  Psychologic:   Patient denies  depression and anxiety.   VITAL SIGNS:      10/04/2023 12:50 PM  Weight 198 lb / 89.81 kg  Height 73 in / 185.42 cm  BP 106/72 mmHg  Pulse 78 /min  Temperature 98.4 F / 36.8 C  BMI 26.1 kg/m   MULTI-SYSTEM PHYSICAL EXAMINATION:    Constitutional: Well-nourished. No physical deformities. Normally developed. Good grooming.  Neck: Neck symmetrical, not swollen. Normal tracheal position.  Respiratory: Normal breath sounds. No labored breathing, no use of accessory muscles.   Cardiovascular:  Regular rate and rhythm. No murmur, no gallop.   Skin: No paleness, no jaundice, no cyanosis. No lesion, no ulcer, no rash.  Neurologic / Psychiatric: Oriented to time, oriented to place, oriented to person. No depression, no anxiety, no agitation.  Musculoskeletal: Normal gait and station of head and neck.     Complexity of Data:  Lab Test Review:   BUN/Creatinine  Records Review:   Previous Doctor Records, Previous Patient Records  Urine Test Review:   Urine Culture  X-Ray Review: C.T. Abdomen/Pelvis: Reviewed Films. Discussed With Patient.     PROCEDURES:          Visit Complexity - G2211 Chronic management   ASSESSMENT:      ICD-10 Details  1 GU:   Prostatic abscess - N41.2 Chronic, Stable - He needs cystoscopy with bil retrograde pyelograms and TUR unroofing of a prostatic abscess cavity. I have reviewed the risks in detail. He will be covered with meropenem perioperatively for his history of ESBL e. coli.   2   Hydronephrosis - N13.0 Chronic, Stable - I believe this is secondary to outlet obstruction from the chronic abscess cavity.   3   Chronic cystitis (w/o hematuria) - N30.20 Chronic, Stable - He will try to get a urine for culture today.    PLAN:           Orders Labs Urine Culture  Lab Notes: If he is able to get a specimen.          Schedule Return Visit/Planned Activity: ASAP - Schedule Surgery             Note: Unroof of chronic prostatic abscess.           Document Letter(s):  Created for Patient: Clinical Summary         Notes:   CC: [facility], Greg Calone FNP.

## 2023-11-07 NOTE — Anesthesia Procedure Notes (Signed)
 Procedure Name: LMA Insertion Date/Time: 11/07/2023 12:07 PM  Performed by: Paauilo Cid, CRNAPre-anesthesia Checklist: Patient identified, Emergency Drugs available, Suction available, Patient being monitored and Timeout performed Patient Re-evaluated:Patient Re-evaluated prior to induction Oxygen Delivery Method: Circle system utilized Induction Type: IV induction Ventilation: Mask ventilation without difficulty LMA: LMA inserted LMA Size: 4.0 Tube type: Oral Number of attempts: 1 Placement Confirmation: positive ETCO2 and breath sounds checked- equal and bilateral Secured at: 22 cm Tube secured with: Tape Dental Injury: Teeth and Oropharynx as per pre-operative assessment

## 2023-11-07 NOTE — Transfer of Care (Signed)
 Immediate Anesthesia Transfer of Care Note  Patient: Jacob Gutierrez  Procedure(s) Performed: TURP (TRANSURETHRAL RESECTION OF PROSTATE) (Prostate)  Patient Location: PACU  Anesthesia Type:General  Level of Consciousness: awake, alert , and patient cooperative  Airway & Oxygen Therapy: Patient Spontanous Breathing and Patient connected to face mask oxygen  Post-op Assessment: Report given to RN and Post -op Vital signs reviewed and stable  Post vital signs: Reviewed and stable  Last Vitals:  Vitals Value Taken Time  BP 111/76 11/07/23 1318  Temp    Pulse 90 11/07/23 1319  Resp 18 11/07/23 1319  SpO2 100 % 11/07/23 1319  Vitals shown include unfiled device data.  Last Pain:  Vitals:   11/07/23 1016  TempSrc:   PainSc: 0-No pain         Complications: No notable events documented.

## 2023-11-07 NOTE — Anesthesia Preprocedure Evaluation (Addendum)
 Anesthesia Evaluation  Patient identified by MRN, date of birth, ID band Patient awake    Reviewed: Allergy & Precautions, H&P , NPO status , Patient's Chart, lab work & pertinent test results  Airway Mallampati: II  TM Distance: >3 FB Neck ROM: Full    Dental no notable dental hx. (+) Edentulous Upper, Edentulous Lower, Dental Advisory Given   Pulmonary Current Smoker   Pulmonary exam normal breath sounds clear to auscultation       Cardiovascular hypertension,  Rhythm:Regular Rate:Normal     Neuro/Psych  Headaches  negative psych ROS   GI/Hepatic Neg liver ROS,GERD  Medicated,,  Endo/Other  negative endocrine ROS    Renal/GU Renal InsufficiencyRenal disease  negative genitourinary   Musculoskeletal  (+) Arthritis ,    Abdominal   Peds  Hematology  (+) Blood dyscrasia, anemia , HIV  Anesthesia Other Findings   Reproductive/Obstetrics negative OB ROS                             Anesthesia Physical Anesthesia Plan  ASA: 3  Anesthesia Plan: General   Post-op Pain Management: Tylenol PO (pre-op)*   Induction: Intravenous  PONV Risk Score and Plan: 2 and Ondansetron, Dexamethasone and Midazolam  Airway Management Planned: LMA  Additional Equipment:   Intra-op Plan:   Post-operative Plan: Extubation in OR  Informed Consent: I have reviewed the patients History and Physical, chart, labs and discussed the procedure including the risks, benefits and alternatives for the proposed anesthesia with the patient or authorized representative who has indicated his/her understanding and acceptance.     Dental advisory given  Plan Discussed with: CRNA  Anesthesia Plan Comments:        Anesthesia Quick Evaluation

## 2023-11-08 ENCOUNTER — Encounter (HOSPITAL_COMMUNITY): Payer: Self-pay | Admitting: Urology

## 2023-11-08 DIAGNOSIS — B9620 Unspecified Escherichia coli [E. coli] as the cause of diseases classified elsewhere: Secondary | ICD-10-CM | POA: Diagnosis present

## 2023-11-08 DIAGNOSIS — Z881 Allergy status to other antibiotic agents status: Secondary | ICD-10-CM | POA: Diagnosis not present

## 2023-11-08 DIAGNOSIS — K219 Gastro-esophageal reflux disease without esophagitis: Secondary | ICD-10-CM | POA: Diagnosis present

## 2023-11-08 DIAGNOSIS — N179 Acute kidney failure, unspecified: Secondary | ICD-10-CM | POA: Diagnosis present

## 2023-11-08 DIAGNOSIS — N412 Abscess of prostate: Principal | ICD-10-CM

## 2023-11-08 DIAGNOSIS — Z79899 Other long term (current) drug therapy: Secondary | ICD-10-CM | POA: Diagnosis not present

## 2023-11-08 DIAGNOSIS — N1330 Unspecified hydronephrosis: Secondary | ICD-10-CM | POA: Diagnosis not present

## 2023-11-08 DIAGNOSIS — D62 Acute posthemorrhagic anemia: Secondary | ICD-10-CM | POA: Diagnosis not present

## 2023-11-08 DIAGNOSIS — N136 Pyonephrosis: Secondary | ICD-10-CM | POA: Diagnosis present

## 2023-11-08 DIAGNOSIS — M1990 Unspecified osteoarthritis, unspecified site: Secondary | ICD-10-CM | POA: Diagnosis present

## 2023-11-08 DIAGNOSIS — D696 Thrombocytopenia, unspecified: Secondary | ICD-10-CM | POA: Diagnosis present

## 2023-11-08 DIAGNOSIS — I129 Hypertensive chronic kidney disease with stage 1 through stage 4 chronic kidney disease, or unspecified chronic kidney disease: Secondary | ICD-10-CM | POA: Diagnosis present

## 2023-11-08 DIAGNOSIS — N3020 Other chronic cystitis without hematuria: Secondary | ICD-10-CM | POA: Diagnosis present

## 2023-11-08 DIAGNOSIS — Z8719 Personal history of other diseases of the digestive system: Secondary | ICD-10-CM | POA: Diagnosis not present

## 2023-11-08 DIAGNOSIS — A539 Syphilis, unspecified: Secondary | ICD-10-CM | POA: Diagnosis not present

## 2023-11-08 DIAGNOSIS — Z8249 Family history of ischemic heart disease and other diseases of the circulatory system: Secondary | ICD-10-CM | POA: Diagnosis not present

## 2023-11-08 DIAGNOSIS — Z8619 Personal history of other infectious and parasitic diseases: Secondary | ICD-10-CM | POA: Diagnosis not present

## 2023-11-08 DIAGNOSIS — F17290 Nicotine dependence, other tobacco product, uncomplicated: Secondary | ICD-10-CM | POA: Diagnosis present

## 2023-11-08 DIAGNOSIS — F17210 Nicotine dependence, cigarettes, uncomplicated: Secondary | ICD-10-CM | POA: Diagnosis present

## 2023-11-08 DIAGNOSIS — Z8616 Personal history of COVID-19: Secondary | ICD-10-CM | POA: Diagnosis not present

## 2023-11-08 DIAGNOSIS — Z833 Family history of diabetes mellitus: Secondary | ICD-10-CM | POA: Diagnosis not present

## 2023-11-08 DIAGNOSIS — Z880 Allergy status to penicillin: Secondary | ICD-10-CM | POA: Diagnosis not present

## 2023-11-08 DIAGNOSIS — N2889 Other specified disorders of kidney and ureter: Secondary | ICD-10-CM | POA: Diagnosis present

## 2023-11-08 DIAGNOSIS — B20 Human immunodeficiency virus [HIV] disease: Secondary | ICD-10-CM | POA: Diagnosis present

## 2023-11-08 DIAGNOSIS — N182 Chronic kidney disease, stage 2 (mild): Secondary | ICD-10-CM | POA: Diagnosis present

## 2023-11-08 DIAGNOSIS — Z8042 Family history of malignant neoplasm of prostate: Secondary | ICD-10-CM | POA: Diagnosis not present

## 2023-11-08 LAB — CBC WITH DIFFERENTIAL/PLATELET
Abs Immature Granulocytes: 0.08 10*3/uL — ABNORMAL HIGH (ref 0.00–0.07)
Basophils Absolute: 0 10*3/uL (ref 0.0–0.1)
Basophils Relative: 0 %
Eosinophils Absolute: 0 10*3/uL (ref 0.0–0.5)
Eosinophils Relative: 0 %
HCT: 28.1 % — ABNORMAL LOW (ref 39.0–52.0)
Hemoglobin: 9.3 g/dL — ABNORMAL LOW (ref 13.0–17.0)
Immature Granulocytes: 1 %
Lymphocytes Relative: 5 %
Lymphs Abs: 0.3 10*3/uL — ABNORMAL LOW (ref 0.7–4.0)
MCH: 32.5 pg (ref 26.0–34.0)
MCHC: 33.1 g/dL (ref 30.0–36.0)
MCV: 98.3 fL (ref 80.0–100.0)
Monocytes Absolute: 0.3 10*3/uL (ref 0.1–1.0)
Monocytes Relative: 6 %
Neutro Abs: 4.8 10*3/uL (ref 1.7–7.7)
Neutrophils Relative %: 88 %
Platelets: 134 10*3/uL — ABNORMAL LOW (ref 150–400)
RBC: 2.86 MIL/uL — ABNORMAL LOW (ref 4.22–5.81)
RDW: 13.6 % (ref 11.5–15.5)
WBC: 5.5 10*3/uL (ref 4.0–10.5)
nRBC: 0 % (ref 0.0–0.2)

## 2023-11-08 LAB — BASIC METABOLIC PANEL WITH GFR
Anion gap: 5 (ref 5–15)
BUN: 18 mg/dL (ref 6–20)
CO2: 21 mmol/L — ABNORMAL LOW (ref 22–32)
Calcium: 8 mg/dL — ABNORMAL LOW (ref 8.9–10.3)
Chloride: 104 mmol/L (ref 98–111)
Creatinine, Ser: 1.29 mg/dL — ABNORMAL HIGH (ref 0.61–1.24)
GFR, Estimated: >60 mL/min (ref >60)
Glucose, Bld: 152 mg/dL — ABNORMAL HIGH (ref 70–99)
Potassium: 4 mmol/L (ref 3.5–5.1)
Sodium: 130 mmol/L — ABNORMAL LOW (ref 135–145)

## 2023-11-08 LAB — SURGICAL PATHOLOGY

## 2023-11-08 NOTE — Plan of Care (Signed)
 Pt given diet, no hint of surgery today No acute changes   Problem: Clinical Measurements: Goal: Will remain free from infection Outcome: Progressing   Problem: Clinical Measurements: Goal: Cardiovascular complication will be avoided Outcome: Progressing

## 2023-11-08 NOTE — Progress Notes (Signed)
 1 Day Post-Op  Subjective: He is doing well without pain or complaints.   His UA is clear in the tubing.  His Hgb is 9.3 and Cr is 1.29.   ROS:  Review of Systems  All other systems reviewed and are negative.   Anti-infectives: Anti-infectives (From admission, onward)    Start     Dose/Rate Route Frequency Ordered Stop   11/08/23 1000  dapsone tablet 100 mg        100 mg Oral Daily 11/07/23 1426     11/08/23 0800  Darunavir-Cobicistat-Emtricitabine-Tenofovir Alafenamide (SYMTUZA) 800-150-200-10 MG TABS 1 tablet        1 tablet Oral Daily with breakfast 11/07/23 1426     11/07/23 2200  meropenem (MERREM) 1 g in sodium chloride 0.9 % 100 mL IVPB  Status:  Discontinued        1 g 200 mL/hr over 30 Minutes Intravenous Every 8 hours 11/07/23 0950 11/07/23 1456   11/07/23 2000  ertapenem (INVANZ) 1 g in sodium chloride 0.9 % 100 mL IVPB        1 g 200 mL/hr over 30 Minutes Intravenous Every 24 hours 11/07/23 1456     11/07/23 1643  meropenem (MERREM) 1 g in sodium chloride 0.9 % 100 mL IVPB  Status:  Discontinued        1 g 200 mL/hr over 30 Minutes Intravenous Every 12 hours 11/07/23 1644 11/07/23 1654   11/07/23 0930  meropenem (MERREM) 1 g in sodium chloride 0.9 % 100 mL IVPB        1 g 200 mL/hr over 30 Minutes Intravenous  Once 11/07/23 0925 11/07/23 1213       Current Facility-Administered Medications  Medication Dose Route Frequency Provider Last Rate Last Admin   0.45 % NaCl with KCl 20 mEq / L infusion   Intravenous Continuous Homero Luster, MD 75 mL/hr at 11/08/23 0407 New Bag at 11/08/23 0407   acetaminophen (TYLENOL) tablet 650 mg  650 mg Oral Q4H PRN Deseree Zemaitis, MD       bisacodyl (DULCOLAX) suppository 10 mg  10 mg Rectal Daily PRN Bailey Kolbe, MD       dapsone tablet 100 mg  100 mg Oral Daily Avin Gibbons, MD       Darunavir-Cobicistat-Emtricitabine-Tenofovir Alafenamide (SYMTUZA) 800-150-200-10 MG TABS 1 tablet  1 tablet Oral Q breakfast Boston Catarino, MD       ertapenem  [facility]) 1 g in sodium chloride 0.9 % 100 mL IVPB  1 g Intravenous Q24H Manandhar, Sabina, MD 200 mL/hr at 11/07/23 2114 1 g at 11/07/23 2114   HYDROmorphone (DILAUDID) injection 0.5-1 mg  0.5-1 mg Intravenous Q2H PRN Juandedios Dudash, MD   1 mg at 11/07/23 1507   ondansetron (ZOFRAN) injection 4 mg  4 mg Intravenous Q4H PRN Ahmyah Gidley, MD       oxyCODONE (Oxy IR/ROXICODONE) immediate release tablet 5 mg  5 mg Oral Q4H PRN Ranae Casebier, MD       pantoprazole (PROTONIX) EC tablet 40 mg  40 mg Oral Daily Shaquan Puerta, MD       senna-docusate (Senokot-S) tablet 1 tablet  1 tablet Oral QHS PRN Famous Eisenhardt, MD       sodium bicarbonate tablet 1,300 mg  1,300 mg Oral BID Shenica Holzheimer, MD   1,300 mg at 11/07/23 2114   sodium phosphate (FLEET) enema 1 enema  1 enema Rectal Once PRN Sonakshi Rolland, MD       tamsulosin (FLOMAX) capsule 0.4  mg  0.4 mg Oral Daily Toniann Dickerson, MD       zolpidem (AMBIEN) tablet 5 mg  5 mg Oral QHS PRN,MR X 1 Homero Luster, MD         Objective: Vital signs in last 24 hours: Temp:  [97.4 F (36.3 C)-98.6 F (37 C)] 98 F (36.7 C) (04/16 0327) Pulse Rate:  [67-93] 67 (04/16 0327) Resp:  [13-19] 19 (04/16 0327) BP: (102-114)/(72-89) 113/77 (04/16 0327) SpO2:  [98 %-100 %] 100 % (04/16 0327) Weight:  [87.5 kg-89.3 kg] 89.3 kg (04/15 1430)  Intake/Output from previous day: 04/15 0701 - 04/16 0700 In: 1420 [P.O.:120; I.V.:1200; IV Piggyback:100] Out: 2050 [Urine:1950; Blood:100] Intake/Output this shift: No intake/output data recorded.   Physical Exam Vitals reviewed.  Constitutional:      Appearance: Normal appearance.  Neurological:     Mental Status: He is alert.     Lab Results:  Recent Labs    11/08/23 0411  WBC 5.5  HGB 9.3*  HCT 28.1*  PLT 134*   BMET Recent Labs    11/08/23 0411  NA 130*  K 4.0  CL 104  CO2 21*  GLUCOSE 152*  BUN 18  CREATININE 1.29*  CALCIUM 8.0*   PT/INR Recent Labs    11/07/23 1514  LABPROT 15.4*  INR 1.2   ABG No  results for input(s): "PHART", "HCO3" in the last 72 hours.  Invalid input(s): "PCO2", "PO2"  Studies/Results: DG C-Arm 1-60 Min-No Report Result Date: 11/07/2023 Fluoroscopy was utilized by the requesting physician.  No radiographic interpretation.     Assessment and Plan: Chronic prostatic abscess s/p unroofing.  The urine remains clear in the tubing.  I removed 12ml from the balloon.  If he continues to have clear urine, we can advance the foley balloon back into the bladder.  I will want him to keep the foley for a week to allow healing of the posterior perforation of the prostate.     Bilateral hydronephrosis with CKD.  His Cr is down to 1.29 with the right stent.  He is to have a left NT during this admission.    Chronic cystitis.  He remains on Ertapenem per ID.     Acute Blood loss anemia.  His Hgb is down to 9.3 from 11.2 preop but is more in his prior range.      LOS: 0 days    Homero Luster 4/16/2025Patient ID: Jacob Gutierrez, male   DOB: January 23, 1986, 38 y.o.   MRN: [id number]

## 2023-11-08 NOTE — Consult Note (Addendum)
 [facility]                                                                                        Patient Identification: Patient Name: Jacob Gutierrez MRN: [id number] Admit Date: 11/07/2023  7:51 AM Today's Date: 11/08/2023 Reason for consult: Prostatic abscess Requesting provider: Dr. Annabell Howells  Principal Problem:   Prostatic abscess   Antibiotics:  Ertapenem postop   Lines/Hardware:  Assessment # Chronic prostatic abscess  4/16 S/p cystoscopy with bilateral retrograde pyelogram, right ureteral stent insertion, attempted left ureteral stent insertion, transurethral unroofing of chronic prostatic abscess.  Complicated with posterior prostatic perforation.  No culture sent during OR. Path was suggestive of acute and chronic inflammation.  # Bilateral hydronephrosis with AKI on CKD - s/p rt ureteral stent, creatinine down to 1.29 - IR consulted for left nephrostomy   # Thrombocytopenia -platelets 134, monitor  # AIDs  - still with poor compliance   Recommendations  - continue ertapenem, likely same ESBL E coli from prior infection. Low suspicion for MRSA,  atypical or other causes of chronic prostatitis like fungal or mycobacterial.  - Monitor CBC and CMP on abtx - Follow plans from IR for left nephrostomy  - Continue symtuza and dapsone.  - RPR, Urine GC, HIV RNA and CD4 ordered  - Engage social work/case management due to being homelesss and possible need for IV antibiotics  - Contact precautions   Rest of the management as per the primary team. Please call with questions or concerns.  Thank you for the consult  __________________________________________________________________________________________________________ HPI and Hospital Course: 38 year old male with prior history as below including AIDS with poor compliance, Anal dysplasia, substance abuse, Cryptosporidium  gastroenteritis, Shigella gastroenteritis, history of pulmonary coccidioidomycosis, monkeypox,  strep pyogenes bacteremia, CVA, syphilis status posttreatment, HTN, history of ESBL E. coli UTI/bacteremia with history of prostatic abscess being unroofed by Dr. Marlou Porch in 2019 as well drainage of perineal abscess by Dr. Vernie Ammons in 11/19 who is directly admitted by urology Dr. Wilson Singer for drainage of chronic prostatic abscess  Admitted 12/26-1/16/25 for sepsis secondary to complicated UTI.  Urine culture with ESBL E. coli and was treated with ertapenem inpatient and discharged on omadacycline for 2 weeks.  Workup was also found to have evidence of a prostate bladder communication and was recommended to follow-up with [facility] for urology evaluation.   Per Urology note on 4/15 He had a CT today that shows bilateral hydronephrosis with some bladder wall thickening and a fluid collection in the prostate extending posteriorly to the apex. This fluid collection was actually present on a prior CT from 5/23 but was not present in 10/22. His Cr. Is 1.29 which is down from 1.76 on admission but up from baseline of 0.94 in 9/24. He was last seen by [place] in 7/22 and had pyelonephritis with mild right hydroureteronephrosis to the bladder. He had a foley then but no stent. He tested positive for COVID on his respiratory panel. He is feeling much better on the antibiotics with decrease pain and voiding symptoms. PVR today is 8ml.   He reports missing ART at least once a  week otherwise compliant with symtuza and dapsone. He does not have a permanent housing and stays with friend.    ROS: General- Denies fever, chills, loss of appetite and loss of weight HEENT - Denies headache, blurry vision, neck pain, sinus pain Chest - Denies any chest pain, SOB or cough CVS- Denies any dizziness/lightheadedness, syncopal attacks, palpitations Abdomen- Denies any nausea, vomiting, abdominal pain, hematochezia and diarrhea Neuro - Denies any  weakness, numbness, tingling sensation Psych - Denies any changes in mood irritability or depressive symptoms GU- urinary symptoms better after OR yesterday Skin - denies any rashes/lesions MSK - denies any joint pain/swelling or restricted ROM   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)    GERD (gastroesophageal reflux disease)    H/O coccidioidomycosis    pulmonary    HIV disease (HCC)    Human monkeypox    Past history of allergy to penicillin-type antibiotic 07/03/2012   desensitization   Shigella gastroenteritis    Stroke [facility])    Syphilis    history /treated    Syphilis, unspecified 09/12/2016   Past Surgical History:  Procedure Laterality Date   BIOPSY  10/16/2020   Procedure: BIOPSY;  Surgeon: Lemar Lofty., MD;  Location: [facility] ENDOSCOPY;  Service: Gastroenterology;;   BIOPSY  10/23/2020   Procedure: BIOPSY;  Surgeon: Lynann Bologna, MD;  Location: [facility] ENDOSCOPY;  Service: Endoscopy;;   COLONOSCOPY WITH PROPOFOL N/A 10/23/2020   Procedure: COLONOSCOPY WITH PROPOFOL;  Surgeon: Lynann Bologna, MD;  Location: [facility] ENDOSCOPY;  Service: Endoscopy;  Laterality: N/A;   ESOPHAGOGASTRODUODENOSCOPY (EGD) WITH PROPOFOL N/A 10/16/2020   Procedure: ESOPHAGOGASTRODUODENOSCOPY (EGD) WITH PROPOFOL;  Surgeon: Meridee Score Netty Starring., MD;  Location: [facility] ENDOSCOPY;  Service: Gastroenterology;  Laterality: N/A;   TRANSURETHRAL RESECTION OF PROSTATE N/A 12/09/2017   Procedure: TRANSURETHRAL RESECTION OF THE PROSTATE (TURP);  Surgeon: Crist Fat, MD;  Location: [facility] ORS;  Service: Urology;  Laterality: N/A;   TRANSURETHRAL RESECTION OF PROSTATE N/A 11/07/2023   Procedure: TURP (TRANSURETHRAL RESECTION OF PROSTATE);  Surgeon: Bjorn Pippin, MD;  Location: [facility] ORS;  Service: Urology;  Laterality: N/A;  TRANSURETHRAL RESECTION OF PROSTATIC ABSCESS WITHCYSTO AND  BILATERAL RETROGRADES    Scheduled Meds:  dapsone  100 mg Oral Daily   Darunavir-Cobicistat-Emtricitabine-Tenofovir Alafenamide  1 tablet Oral Q breakfast   pantoprazole  40 mg Oral Daily   sodium bicarbonate  1,300 mg Oral BID   tamsulosin  0.4 mg Oral Daily   Continuous Infusions:  0.45 % NaCl with KCl 20 mEq / L 75 mL/hr at 11/08/23 0407   ertapenem 1 g (11/07/23 2114)   PRN Meds:.acetaminophen, bisacodyl, HYDROmorphone (DILAUDID) injection, ondansetron, oxyCODONE, senna-docusate, sodium phosphate, zolpidem  Allergies  Allergen Reactions   Vancomycin Anaphylaxis, Itching, Swelling and Other (See Comments)    Angioedema and "everything swells"   Amoxicillin Other (See Comments)    From childhood: "I had a reaction when i was little." (??) Has tolerated multiple cephalosporins in the past   Social History   Socioeconomic History   Marital status: Single    Spouse name: Not on file   Number of children: Not on file   Years of education: Not on file   Highest education level: Not on file  Occupational History  Not on file  Tobacco Use   Smoking status: Every Day    Current packs/day: 0.30    Types: Cigarettes, E-cigarettes   Smokeless tobacco: Never   Tobacco comments:    States he is trying to cut back, goes through a pack of cigarettes in about a week  Vaping Use   Vaping status: Some Days   Substances: Flavoring  Substance and Sexual Activity   Alcohol use: Yes    Alcohol/week: 1.0 standard drink of alcohol    Types: 1 Standard drinks or equivalent per week    Comment: whiskey occasionally    Drug use: Yes    Frequency: 1.0 times per week    Types: Marijuana    Comment: socially   Sexual activity: Yes    Partners: Female, Male    Birth control/protection: Condom    Comment: condoms given  Other Topics Concern   Not on file  Social  History Narrative   Not on file   Social Drivers of Health   Financial Resource Strain: Low Risk  (06/09/2018)   Overall Financial Resource Strain (CARDIA)    Difficulty of Paying Living Expenses: Not very hard  Food Insecurity: No Food Insecurity (11/07/2023)   Hunger Vital Sign    Worried About Running Out of Food in the Last Year: Never true    Ran Out of Food in the Last Year: Never true  Transportation Needs: No Transportation Needs (11/07/2023)   PRAPARE - Administrator, Civil Service (Medical): No    Lack of Transportation (Non-Medical): No  Physical Activity: Unknown (06/09/2018)   Exercise Vital Sign    Days of Exercise per Week: Patient declined    Minutes of Exercise per Session: Patient declined  Stress: Not on file  Social Connections: Patient Declined (11/07/2023)   Social Connection and Isolation Panel [NHANES]    Frequency of Communication with Friends and Family: Patient declined    Frequency of Social Gatherings with Friends and Family: Patient declined    Attends Religious Services: Patient declined    Database administrator or Organizations: Patient declined    Attends Banker Meetings: Patient declined    Marital Status: Patient declined  Intimate Partner Violence: Patient Declined (11/07/2023)   Humiliation, Afraid, Rape, and Kick questionnaire    Fear of Current or Ex-Partner: Patient declined    Emotionally Abused: Patient declined    Physically Abused: Patient declined    Sexually Abused: Patient declined   Family History  Problem Relation Age of Onset   Hypertension Mother    Lupus Maternal Grandmother    Diabetes Maternal Grandmother    Cancer Paternal Grandfather        unknown    Prostate cancer Father    Diabetes Father    Diabetes Maternal Grandfather    Colon cancer Neg Hx    Stomach cancer Neg Hx    Esophageal cancer Neg Hx    Pancreatic cancer Neg Hx     Vitals BP 113/77 (BP Location: Left Arm)   Pulse 67    Temp 98 F (36.7 C) (Oral)   Resp 19   Ht 6\' 1"  (1.854 m)   Wt 89.3 kg   SpO2 100%   BMI 25.97 kg/m    Physical Exam Constitutional:  adult male lying in the bed for getting a blood draw, nontoxic appearing    Comments: HEENT WNL  Cardiovascular:     Rate and Rhythm: Normal rate and regular rhythm.  Heart sounds: S1 and S2  Pulmonary:     Effort: Pulmonary effort is normal.     Comments: Normal breath sounds  Abdominal:     Palpations: Abdomen is soft.     Tenderness: Nontender and nondistended, Foley draining urine  Musculoskeletal:        General: No swelling or tenderness in peripheral joints  Skin:    Comments: No rashes  Neurological:     General: Awake, alert and oriented, grossly nonfocal  Psychiatric:        Mood and Affect: Mood normal.    Pertinent Microbiology Results for orders placed or performed during the hospital encounter of 07/20/23  Culture, blood (Routine x 2)     Status: None   Collection Time: 07/20/23  1:10 PM   Specimen: BLOOD LEFT HAND  Result Value Ref Range Status   Specimen Description BLOOD LEFT HAND  Final   Special Requests   Final    BOTTLES DRAWN AEROBIC AND ANAEROBIC Blood Culture results may not be optimal due to an inadequate volume of blood received in culture bottles   Culture   Final    NO GROWTH 5 DAYS Performed at [facility], [address] [place], [place] [address]    Report Status 07/25/2023 FINAL  Final  Urine Culture     Status: Abnormal   Collection Time: 07/20/23  3:13 PM   Specimen: Urine, Clean Catch  Result Value Ref Range Status   Specimen Description URINE, CLEAN CATCH  Final   Special Requests NONE Reflexed from [id number]  Final   Culture (A)  Final    >=100,000 COLONIES/mL ESCHERICHIA COLI CORRECTED ON 12/30 AT 0272: PREVIOUSLY REPORTED AS >=100,000 COLONIES/mL GRAM NEGATIVE RODS Confirmed Extended Spectrum Beta-Lactamase Producer (ESBL).  In bloodstream infections from ESBL organisms,  carbapenems are preferred over piperacillin/tazobactam. They are shown to have a lower risk of mortality. See Scanned report in [place] Link Performed at [facility], [address], [place], [place] [address]    Report Status 07/24/2023 FINAL  Final   Organism ID, Bacteria ESCHERICHIA COLI (A)  Final      Susceptibility   Escherichia coli - MIC*    AMPICILLIN >=32 RESISTANT Resistant     CEFAZOLIN >=64 RESISTANT Resistant     CEFEPIME 16 RESISTANT Resistant     CEFTRIAXONE >=64 RESISTANT Resistant     CIPROFLOXACIN >=4 RESISTANT Resistant     GENTAMICIN <=1 SENSITIVE Sensitive     IMIPENEM <=0.25 SENSITIVE Sensitive     NITROFURANTOIN <=16 SENSITIVE Sensitive     TRIMETH/SULFA >=320 RESISTANT Resistant     AMPICILLIN/SULBACTAM 4 SENSITIVE Sensitive     PIP/TAZO <=4 SENSITIVE Sensitive ug/mL    * >=100,000 COLONIES/mL ESCHERICHIA COLI CORRECTED ON 12/30 AT 4034: PREVIOUSLY REPORTED AS >=100,000 COLONIES/mL GRAM NEGATIVE RODS  Respiratory (~20 pathogens) panel by PCR     Status: Abnormal   Collection Time: 07/20/23  6:06 PM   Specimen: Nasopharyngeal Swab; Respiratory  Result Value Ref Range Status   Adenovirus NOT DETECTED NOT DETECTED Final   Coronavirus 229E NOT DETECTED NOT DETECTED Final    Comment: (NOTE) The Coronavirus on the Respiratory Panel, DOES NOT test for the novel  Coronavirus (2019 nCoV)    Coronavirus HKU1 NOT DETECTED NOT DETECTED Final   Coronavirus NL63 NOT DETECTED NOT DETECTED Final   Coronavirus OC43 DETECTED (A) NOT DETECTED Final   Metapneumovirus NOT DETECTED NOT DETECTED Final   Rhinovirus / Enterovirus NOT DETECTED  NOT DETECTED Final   Influenza A NOT DETECTED NOT DETECTED Final   Influenza B NOT DETECTED NOT DETECTED Final   Parainfluenza Virus 1 NOT DETECTED NOT DETECTED Final   Parainfluenza Virus 2 NOT DETECTED NOT DETECTED Final   Parainfluenza Virus 3 NOT DETECTED NOT DETECTED Final   Parainfluenza Virus 4 NOT DETECTED NOT DETECTED  Final   Respiratory Syncytial Virus NOT DETECTED NOT DETECTED Final   Bordetella pertussis NOT DETECTED NOT DETECTED Final   Bordetella Parapertussis NOT DETECTED NOT DETECTED Final   Chlamydophila pneumoniae NOT DETECTED NOT DETECTED Final   Mycoplasma pneumoniae NOT DETECTED NOT DETECTED Final    Comment: Performed at [facility], [address], [place], [place] [address]  Blastomyces Antigen     Status: None   Collection Time: 07/20/23  9:54 PM   Specimen: Blood  Result Value Ref Range Status   Blastomyces Antigen None Detected None Detected ng/mL Final    Comment: (NOTE) Reference Interval: None Detected Reportable Range: 0.31 ng/mL - 20.00 ng/mL Results above 20.00 ng/mL are reported as 'Positive, Above the Limit of Quantification' This test was developed and its performance characteristics determined by [employer]. It has not been cleared or approved by the FDA; however, FDA clearance or approval is not currently required for clinical use. The results are not intended to be used as the sole means for clinical diagnosis or patient decisions.    Interpretation Negative  Final   Specimen Type SERUM  Final    Comment: (NOTE) Performed At: [facility] [address] [place], [place] [id number] Roxanne Gates MD [id number]   MRSA Next Gen by PCR, Nasal     Status: None   Collection Time: 07/21/23 11:47 AM   Specimen: Nasal Mucosa; Nasal Swab  Result Value Ref Range Status   MRSA by PCR Next Gen NOT DETECTED NOT DETECTED Final    Comment: (NOTE) The GeneXpert MRSA Assay (FDA approved for NASAL specimens only), is one component of a comprehensive MRSA colonization surveillance program. It is not intended to diagnose MRSA infection nor to guide or monitor treatment for MRSA infections. Test performance is not FDA approved in patients less than 69 years old. Performed at [facility], [address], [place], [place] [address]   MIC  (1 Drug)-     Status: None   Collection Time: 07/25/23  2:06 PM  Result Value Ref Range Status   Min Inhibitory Conc (1 Drug) REFERT  Final    Comment: (NOTE) Please refer to the following specimen for additional lab results. Please refer to spec # [id number] for test result. Francee Nodal notified 07-28-2023 at 17:55 AM. Performed At: [facility] [address] [place], [place] [id number] Jolene Schimke MD UV:[id number]    Source ECOL CESBL  Final    Comment: Performed at [facility], [address] [place], [place] [address]   Pathology FINAL MICROSCOPIC DIAGNOSIS:   A. PROSTATE CHIPS, TURP:  Benign prostatic glands and urothelium  with prominent acute and chronic inflammation     Pertinent Lab seen by me:    Latest Ref Rng & Units 11/08/2023    4:11 AM 10/31/2023    2:16 PM 08/09/2023    7:16 AM  CBC  WBC 4.0 - 10.5 K/uL 5.5  3.7  3.0   Hemoglobin 13.0 - 17.0 g/dL 9.3  56.3  9.6   Hematocrit 39.0 - 52.0 % 28.1  34.7  29.9   Platelets 150 -  400 K/uL 134  158  215       Latest Ref Rng & Units 11/08/2023    4:11 AM 10/31/2023    2:16 PM 09/11/2023    3:28 PM  CMP  Glucose 70 - 99 mg/dL 098  119  147   BUN 6 - 20 mg/dL 18  16  15    Creatinine 0.61 - 1.24 mg/dL 8.29  5.62  1.30   Sodium 135 - 145 mmol/L 130  132  133   Potassium 3.5 - 5.1 mmol/L 4.0  3.4  3.5   Chloride 98 - 111 mmol/L 104  104  102   CO2 22 - 32 mmol/L 21  21  24    Calcium 8.9 - 10.3 mg/dL 8.0  8.1  8.5   Total Protein 6.1 - 8.1 g/dL   8.8   Total Bilirubin 0.2 - 1.2 mg/dL   0.4   AST 10 - 40 U/L   17   ALT 9 - 46 U/L   13      Pertinent Imagings/Other Imagings Plain films and CT images have been personally visualized and interpreted; radiology reports have been reviewed. Decision making incorporated into the Impression / Recommendations.  DG C-Arm 1-60 Min-No Report Result Date: 11/07/2023 Fluoroscopy was utilized by the requesting physician.  No radiographic interpretation.     I have personally spent 85 minutes involved in face-to-face and non-face-to-face activities for this patient on the day of the visit. Professional time spent includes the following activities: Preparing to see the patient (review of tests), Obtaining and/or reviewing separately obtained history (admission/discharge record), Performing a medically appropriate examination and/or evaluation , Ordering medications/tests/procedures, referring and communicating with other health care professionals, Documenting clinical information in the EMR, Independently interpreting results (not separately reported), Communicating results to the patient/family/caregiver, Counseling and educating the patient/family/caregiver and Care coordination (not separately reported).  Electronically signed by:   Plan d/w requesting provider as well as ID pharm D  Of note, portions of this note may have been created with voice recognition software. While this note has been edited for accuracy, occasional wrong-word or 'sound-a-like' substitutions may have occurred due to the inherent limitations of voice recognition software.   Terre Ferri, MD Infectious Disease Physician [facility] Pager: [phone]

## 2023-11-08 NOTE — Plan of Care (Signed)
  Problem: Education: Goal: Knowledge of General Education information will improve Description: Including pain rating scale, medication(s)/side effects and non-pharmacologic comfort measures Outcome: Progressing   Problem: Coping: Goal: Level of anxiety will decrease Outcome: Progressing   Problem: Pain Managment: Goal: General experience of comfort will improve and/or be controlled Outcome: Progressing

## 2023-11-09 LAB — RPR
RPR Ser Ql: REACTIVE — AB
RPR Titer: 1:8 {titer}

## 2023-11-09 MED ORDER — CHLORHEXIDINE GLUCONATE CLOTH 2 % EX PADS
6.0000 | MEDICATED_PAD | Freq: Every day | CUTANEOUS | Status: DC
  Administered 2023-11-10 – 2023-11-12 (×2): 6 via TOPICAL

## 2023-11-09 NOTE — Plan of Care (Signed)
 ?  Problem: Education: ?Goal: Knowledge of General Education information will improve ?Description: Including pain rating scale, medication(s)/side effects and non-pharmacologic comfort measures ?Outcome: Progressing ?  ?Problem: Health Behavior/Discharge Planning: ?Goal: Ability to manage health-related needs will improve ?Outcome: Progressing ?  ?Problem: Coping: ?Goal: Level of anxiety will decrease ?Outcome: Progressing ?  ?

## 2023-11-09 NOTE — Consult Note (Signed)
 Chief Complaint: Left Hydronephrosis, prostatic abscess causing ureteral obstruction - IR consulted for left percutaneous nephrostomy placement  Referring Provider(s): Bjorn Pippin, MD   Supervising Physician: Oley Balm  Patient Status: [facility] - In-pt  History of Present Illness: Jacob Gutierrez is a 38 y.o. male with pmhx  HIV/AIDS - has been off therapy for 5-6 months, Anal dysplasia, substance abuse, Cryptosporidium gastroenteritis, Shigella gastroenteritis, history of pulmonary coccidioidomycosis, monkeypox,  strep pyogenes bacteremia, CVA, syphilis status posttreatment, HTN, history of ESBL E. coli UTI/bacteremia.  Presenting to IR service for left PCN placement. Has had bilateral ureteral obstruction, suspected to be possibly from prostatic abscess per urology. Had right ureteral stent and prostatic abscess un-roofing 2 days ago on 11/07/23 with Dr. Annabell Howells. Was unable to place left ureteral stent due to stricture during the procedure. Now planned for left PCN.   Patient is Full Code  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)    GERD (gastroesophageal reflux disease)    H/O coccidioidomycosis    pulmonary    HIV disease (HCC)    Human monkeypox    Past history of allergy to penicillin-type antibiotic 07/03/2012   desensitization   Shigella gastroenteritis    Stroke [facility])    Syphilis    history /treated    Syphilis, unspecified 09/12/2016    Past Surgical History:  Procedure Laterality Date   BIOPSY  10/16/2020   Procedure: BIOPSY;  Surgeon: Lemar Lofty., MD;  Location: [facility] ENDOSCOPY;  Service: Gastroenterology;;   BIOPSY  10/23/2020   Procedure: BIOPSY;  Surgeon: Lynann Bologna, MD;  Location: [facility]  ENDOSCOPY;  Service: Endoscopy;;   COLONOSCOPY WITH PROPOFOL N/A 10/23/2020   Procedure: COLONOSCOPY WITH PROPOFOL;  Surgeon: Lynann Bologna, MD;  Location: [facility] ENDOSCOPY;  Service: Endoscopy;  Laterality: N/A;   ESOPHAGOGASTRODUODENOSCOPY (EGD) WITH PROPOFOL N/A 10/16/2020   Procedure: ESOPHAGOGASTRODUODENOSCOPY (EGD) WITH PROPOFOL;  Surgeon: Meridee Score Netty Starring., MD;  Location: [facility] ENDOSCOPY;  Service: Gastroenterology;  Laterality: N/A;   TRANSURETHRAL RESECTION OF PROSTATE N/A 12/09/2017   Procedure: TRANSURETHRAL RESECTION OF THE PROSTATE (TURP);  Surgeon: Crist Fat, MD;  Location: [facility] ORS;  Service: Urology;  Laterality: N/A;   TRANSURETHRAL RESECTION OF PROSTATE N/A 11/07/2023   Procedure: TURP (TRANSURETHRAL RESECTION OF PROSTATE);  Surgeon: Bjorn Pippin, MD;  Location: [facility] ORS;  Service: Urology;  Laterality: N/A;  TRANSURETHRAL RESECTION OF PROSTATIC ABSCESS WITHCYSTO AND  BILATERAL RETROGRADES    Allergies: Vancomycin and Amoxicillin  Medications: Prior to Admission medications   Medication Sig Start Date End Date Taking? Authorizing Provider  acetaminophen (TYLENOL) 500 MG tablet Take 1 tablet (500 mg total) by mouth every 6 (six) hours as needed. 10/27/22  Yes Derwood Kaplan, MD  dapsone 100 MG tablet Take 1 tablet (100 mg total) by mouth daily. 09/11/23  Yes Veryl Speak, FNP  Darunavir-Cobicistat-Emtricitabine-Tenofovir Alafenamide [facility]) 800-150-200-10 MG TABS Take 1 tablet by mouth daily with breakfast. 09/11/23  Yes Veryl Speak, FNP  omeprazole (PRILOSEC) 40 MG capsule Take 40 mg by mouth daily.   Yes [provider]  sodium bicarbonate 650 MG tablet Take 1,300 mg by mouth 2 (two) times daily.   Yes [provider]  tamsulosin (FLOMAX) 0.4 MG CAPS capsule TAKE 1 CAPSULE(0.4 MG)  BY MOUTH DAILY AFTER SUPPER Patient taking differently: Take 0.4 mg by mouth in the morning. 10/09/23  Yes Veryl Speak, FNP     Family History  Problem Relation  Age of Onset   Hypertension Mother    Lupus Maternal Grandmother    Diabetes Maternal Grandmother    Cancer Paternal Grandfather        unknown    Prostate cancer Father    Diabetes Father    Diabetes Maternal Grandfather    Colon cancer Neg Hx    Stomach cancer Neg Hx    Esophageal cancer Neg Hx    Pancreatic cancer Neg Hx     Social History   Socioeconomic History   Marital status: Single    Spouse name: Not on file   Number of children: Not on file   Years of education: Not on file   Highest education level: Not on file  Occupational History   Not on file  Tobacco Use   Smoking status: Every Day    Current packs/day: 0.30    Types: Cigarettes, E-cigarettes   Smokeless tobacco: Never   Tobacco comments:    States he is trying to cut back, goes through a pack of cigarettes in about a week  Vaping Use   Vaping status: Some Days   Substances: Flavoring  Substance and Sexual Activity   Alcohol use: Yes    Alcohol/week: 1.0 standard drink of alcohol    Types: 1 Standard drinks or equivalent per week    Comment: whiskey occasionally    Drug use: Yes    Frequency: 1.0 times per week    Types: Marijuana    Comment: socially   Sexual activity: Yes    Partners: Female, Male    Birth control/protection: Condom    Comment: condoms given  Other Topics Concern   Not on file  Social History Narrative   Not on file   Social Drivers of Health   Financial Resource Strain: Low Risk  (06/09/2018)   Overall Financial Resource Strain (CARDIA)    Difficulty of Paying Living Expenses: Not very hard  Food Insecurity: No Food Insecurity (11/07/2023)   Hunger Vital Sign    Worried About Running Out of Food in the Last Year: Never true    Ran Out of Food in the Last Year: Never true  Transportation Needs: No Transportation Needs (11/07/2023)   PRAPARE - Administrator, Civil Service (Medical): No    Lack of Transportation (Non-Medical): No  Physical Activity: Unknown  (06/09/2018)   Exercise Vital Sign    Days of Exercise per Week: Patient declined    Minutes of Exercise per Session: Patient declined  Stress: Not on file  Social Connections: Patient Declined (11/07/2023)   Social Connection and Isolation Panel [NHANES]    Frequency of Communication with Friends and Family: Patient declined    Frequency of Social Gatherings with Friends and Family: Patient declined    Attends Religious Services: Patient declined    Database administrator or Organizations: Patient declined    Attends Banker Meetings: Patient declined    Marital Status: Patient declined     Review of Systems: A 12 point ROS discussed and pertinent positives are indicated in the HPI above.  All other systems are negative.    Vital Signs: BP 107/61 (BP Location: Right Arm)   Pulse 60   Temp 97.8 F (36.6 C)   Resp 18   Ht 6\' 1"  (  1.854 m)   Wt 196 lb 13.9 oz (89.3 kg)   SpO2 98%   BMI 25.97 kg/m   Advance Care Plan: No documents on file  Physical Exam Vitals and nursing note reviewed.  Constitutional:      Appearance: Normal appearance.  HENT:     Mouth/Throat:     Mouth: Mucous membranes are moist.     Pharynx: Oropharynx is clear.  Cardiovascular:     Rate and Rhythm: Normal rate and regular rhythm.  Pulmonary:     Effort: Pulmonary effort is normal.     Breath sounds: Normal breath sounds.  Abdominal:     Palpations: Abdomen is soft.     Tenderness: There is no abdominal tenderness. There is no guarding or rebound.  Genitourinary:    Comments: Clear yellow urine noted Musculoskeletal:     Right lower leg: No edema.     Left lower leg: No edema.  Skin:    General: Skin is warm and dry.  Neurological:     Mental Status: He is alert and oriented to person, place, and time. Mental status is at baseline.     Imaging: US RENAL Result Date: 11/09/2023 CLINICAL DATA:  [id number] Ureteral obstruction, left [id number] EXAM: RENAL / URINARY TRACT ULTRASOUND  COMPLETE COMPARISON:  US RENAL, 07/20/2023. CT AP, 07/24/2023 and 12/08/2021. FINDINGS: Suboptimal evaluation, with poor acoustic penetration secondary to patient habitus. Right Kidney: Renal measurements: 13.4 x 6.7 x 6.8 cm (volume = 320 cm^3). Echogenicity within normal limits. No mass. Stent tip coiled at the RIGHT renal pelvis. Mild pelviectasis without overt hydronephrosis. Left Kidney: Renal measurements: 12.8 x 8.0 x 6.2 cm (volume = 330 cm^3). Echogenicity within normal limits. No mass. Moderate volume of hydronephrosis visualized. Bladder: Decompressed urinary bladder with Foley catheter visualized Other: No free abdominopelvic fluid IMPRESSION: 1. Moderate volume LEFT hydronephrosis 2. RIGHT kidney decompressed with stent within the renal pelvis. Pelviectasis without hydronephrosis. Electronically Signed   By: Roanna Banning M.D.   On: 11/09/2023 07:05   DG C-Arm 1-60 Min-No Report Result Date: 11/07/2023 Fluoroscopy was utilized by the requesting physician.  No radiographic interpretation.    Labs:  CBC: Recent Labs    08/05/23 0455 08/09/23 0716 10/31/23 1416 11/08/23 0411  WBC 3.0* 3.0* 3.7* 5.5  HGB 8.9* 9.6* 11.2* 9.3*  HCT 27.1* 29.9* 34.7* 28.1*  PLT 196 215 158 134*    COAGS: Recent Labs    07/20/23 1310 07/20/23 1455 11/07/23 1514  INR 1.2  --  1.2  APTT  --  32  --     BMP: Recent Labs    08/05/23 0455 08/09/23 0716 09/11/23 1528 10/31/23 1416 11/08/23 0411  NA 135 135 133* 132* 130*  K 3.9 3.9 3.5 3.4* 4.0  CL 108 106 102 104 104  CO2 23 23 24  21* 21*  GLUCOSE 105* 82 104* 106* 152*  BUN 23* 24* 15 16 18   CALCIUM 8.4* 8.6* 8.5* 8.1* 8.0*  CREATININE 1.32* 1.30* 1.34* 1.40* 1.29*  GFRNONAA >60 >60  --  >60 >60    LIVER FUNCTION TESTS: Recent Labs    07/21/23 1159 07/22/23 0610 07/23/23 0554 07/24/23 0614 09/11/23 1528  BILITOT 0.5 0.5 0.5 0.3 0.4  AST 19 16 28  182* 17  ALT 11 11 14  82* 13  ALKPHOS 40 39 39 50  --   PROT 7.7 7.2 7.9 7.7  8.8*  ALBUMIN 1.9* 1.9* 2.0* 2.1*  --     TUMOR MARKERS: No results for  input(s): "AFPTM", "CEA", "CA199", "CHROMGRNA" in the last 8760 hours.  Assessment and Plan:  Jacob Gutierrez is a 38 y.o. male with pmhx  HIV/AIDS - has been off therapy for 5-6 months, Anal dysplasia, substance abuse, Cryptosporidium gastroenteritis, Shigella gastroenteritis, history of pulmonary coccidioidomycosis, monkeypox,  strep pyogenes bacteremia, CVA, syphilis status posttreatment, HTN, history of ESBL E. coli UTI/bacteremia.  Presenting to IR service for left PCN placement. Has had bilateral ureteral obstruction, suspected to be possibly from prostatic abscess per urology. Had right ureteral stent and prostatic abscess un-roofing 2 days ago on 11/07/23 with Dr. Inga Manges. Was unable to place left ureteral stent due to stricture during the procedure. Now planned for left PCN.  Risks and benefits of left PCN placement was discussed with the patient including, but not limited to, infection, bleeding, significant bleeding causing loss or decrease in renal function or damage to adjacent structures.   All of the patient's questions were answered, patient is agreeable to proceed.  Consent signed and in chart.    Thank you for allowing our service to participate in Jacob Gutierrez 's care.  Electronically Signed: Nicolasa Barrett, PA-C   11/09/2023, 11:08 AM      I spent a total of 40 Minutes    in face to face in clinical consultation, greater than 50% of which was counseling/coordinating care for left percutaneous nephrostomy placement.

## 2023-11-09 NOTE — Plan of Care (Signed)
 No acute changes NPO at midnight  Problem: Clinical Measurements: Goal: Ability to maintain clinical measurements within normal limits will improve Outcome: Progressing

## 2023-11-09 NOTE — Plan of Care (Signed)

## 2023-11-09 NOTE — Progress Notes (Signed)
 2 Days Post-Op  Subjective: NAEON.  Patient continues to drain clear yellow urine with some expected sediment in tubing.  20 cc Foley balloon taken down, catheter advanced.   ROS:  Review of Systems  Genitourinary:  Negative for dysuria, flank pain, frequency, hematuria and urgency.  All other systems reviewed and are negative.   Anti-infectives: Anti-infectives (From admission, onward)    Start     Dose/Rate Route Frequency Ordered Stop   11/08/23 1000  dapsone tablet 100 mg        100 mg Oral Daily 11/07/23 1426     11/08/23 0800  Darunavir-Cobicistat-Emtricitabine-Tenofovir Alafenamide (SYMTUZA) 800-150-200-10 MG TABS 1 tablet        1 tablet Oral Daily with breakfast 11/07/23 1426     11/07/23 2200  meropenem (MERREM) 1 g in sodium chloride 0.9 % 100 mL IVPB  Status:  Discontinued        1 g 200 mL/hr over 30 Minutes Intravenous Every 8 hours 11/07/23 0950 11/07/23 1456   11/07/23 2000  ertapenem (INVANZ) 1 g in sodium chloride 0.9 % 100 mL IVPB        1 g 200 mL/hr over 30 Minutes Intravenous Every 24 hours 11/07/23 1456     11/07/23 1643  meropenem (MERREM) 1 g in sodium chloride 0.9 % 100 mL IVPB  Status:  Discontinued        1 g 200 mL/hr over 30 Minutes Intravenous Every 12 hours 11/07/23 1644 11/07/23 1654   11/07/23 0930  meropenem (MERREM) 1 g in sodium chloride 0.9 % 100 mL IVPB        1 g 200 mL/hr over 30 Minutes Intravenous  Once 11/07/23 0925 11/08/23 1554       Current Facility-Administered Medications  Medication Dose Route Frequency Provider Last Rate Last Admin   acetaminophen (TYLENOL) tablet 650 mg  650 mg Oral Q4H PRN Bjorn Pippin, MD       bisacodyl (DULCOLAX) suppository 10 mg  10 mg Rectal Daily PRN Bjorn Pippin, MD       dapsone tablet 100 mg  100 mg Oral Daily Bjorn Pippin, MD   100 mg at 11/09/23 6213   Darunavir-Cobicistat-Emtricitabine-Tenofovir Alafenamide (SYMTUZA) 800-150-200-10 MG TABS 1 tablet  1 tablet Oral Q breakfast Bjorn Pippin, MD   1  tablet at 11/09/23 0917   ertapenem [facility]) 1 g in sodium chloride 0.9 % 100 mL IVPB  1 g Intravenous Q24H Odette Fraction, MD 200 mL/hr at 11/08/23 2034 1 g at 11/08/23 2034   HYDROmorphone (DILAUDID) injection 0.5-1 mg  0.5-1 mg Intravenous Q2H PRN Bjorn Pippin, MD   1 mg at 11/07/23 1507   ondansetron (ZOFRAN) injection 4 mg  4 mg Intravenous Q4H PRN Bjorn Pippin, MD       oxyCODONE (Oxy IR/ROXICODONE) immediate release tablet 5 mg  5 mg Oral Q4H PRN Bjorn Pippin, MD       pantoprazole (PROTONIX) EC tablet 40 mg  40 mg Oral Daily Bjorn Pippin, MD   40 mg at 11/09/23 0865   senna-docusate (Senokot-S) tablet 1 tablet  1 tablet Oral QHS PRN Bjorn Pippin, MD       sodium bicarbonate tablet 1,300 mg  1,300 mg Oral BID Bjorn Pippin, MD   1,300 mg at 11/09/23 7846   sodium phosphate (FLEET) enema 1 enema  1 enema Rectal Once PRN Bjorn Pippin, MD       tamsulosin [facility]) capsule 0.4 mg  0.4 mg Oral Daily Bjorn Pippin, MD  0.4 mg at 11/09/23 0916   zolpidem (AMBIEN) tablet 5 mg  5 mg Oral QHS PRN,MR X 1 Homero Luster, MD         Objective: Vital signs in last 24 hours: Temp:  [97.8 F (36.6 C)-97.9 F (36.6 C)] 97.8 F (36.6 C) (04/17 0325) Pulse Rate:  [53-61] 60 (04/17 0325) Resp:  [17-18] 18 (04/17 0325) BP: (99-107)/(61-67) 107/61 (04/17 0325) SpO2:  [98 %-99 %] 98 % (04/17 0325)  Intake/Output from previous day: 04/16 0701 - 04/17 0700 In: 820 [P.O.:720; IV Piggyback:100] Out: 3800 [Urine:3800] Intake/Output this shift: No intake/output data recorded.   Physical Exam Vitals reviewed.  Constitutional:      Appearance: Normal appearance.  Neurological:     Mental Status: He is alert.     Lab Results:  Recent Labs    11/08/23 0411  WBC 5.5  HGB 9.3*  HCT 28.1*  PLT 134*   BMET Recent Labs    11/08/23 0411  NA 130*  K 4.0  CL 104  CO2 21*  GLUCOSE 152*  BUN 18  CREATININE 1.29*  CALCIUM 8.0*   PT/INR Recent Labs    11/07/23 1514  LABPROT 15.4*  INR 1.2    ABG No results for input(s): "PHART", "HCO3" in the last 72 hours.  Invalid input(s): "PCO2", "PO2"  Studies/Results: US  RENAL Result Date: 11/09/2023 CLINICAL DATA:  [id number] Ureteral obstruction, left [id number] EXAM: RENAL / URINARY TRACT ULTRASOUND COMPLETE COMPARISON:  US  RENAL, 07/20/2023. CT AP, 07/24/2023 and 12/08/2021. FINDINGS: Suboptimal evaluation, with poor acoustic penetration secondary to patient habitus. Right Kidney: Renal measurements: 13.4 x 6.7 x 6.8 cm (volume = 320 cm^3). Echogenicity within normal limits. No mass. Stent tip coiled at the RIGHT renal pelvis. Mild pelviectasis without overt hydronephrosis. Left Kidney: Renal measurements: 12.8 x 8.0 x 6.2 cm (volume = 330 cm^3). Echogenicity within normal limits. No mass. Moderate volume of hydronephrosis visualized. Bladder: Decompressed urinary bladder with Foley catheter visualized Other: No free abdominopelvic fluid IMPRESSION: 1. Moderate volume LEFT hydronephrosis 2. RIGHT kidney decompressed with stent within the renal pelvis. Pelviectasis without hydronephrosis. Electronically Signed   By: Art Largo M.D.   On: 11/09/2023 07:05   DG C-Arm 1-60 Min-No Report Result Date: 11/07/2023 Fluoroscopy was utilized by the requesting physician.  No radiographic interpretation.     Assessment and Plan:  Chronic prostatic abscess s/p unroofing w/ Dr. Inga Manges 4/15.  Balloon taken down and catheter advanced from prostatic fossa to urinary bladder 4/17.  Bilateral hydronephrosis with CKD.  No labs available today. He is to have a left PCNT during this admission.    Chronic cystitis.  He remains on Ertapenem per ID.     Acute Blood loss anemia.  Trend labs.    LOS: 1 day    Jacob Gutierrez W Jacob Gutierrez 4/17/2025Patient ID: Jacob Gutierrez, male   DOB: 1985-11-22, 38 y.o.   MRN: [id number] Data cause

## 2023-11-10 ENCOUNTER — Inpatient Hospital Stay (HOSPITAL_COMMUNITY)

## 2023-11-10 DIAGNOSIS — N412 Abscess of prostate: Secondary | ICD-10-CM | POA: Diagnosis not present

## 2023-11-10 HISTORY — PX: IR  NEPHROURETERAL CATH PLACE LEFT: IMG6065

## 2023-11-10 LAB — CD4/CD8 (T-HELPER/T-SUPPRESSOR CELL)
CD4 absolute: <35 /uL — ABNORMAL LOW (ref 400–1790)
CD4%: 2.32 % — ABNORMAL LOW (ref 33–65)
CD8 T Cell Abs: 437 /uL (ref 190–1000)
CD8tox: 68.24 % — ABNORMAL HIGH (ref 12–40)
Ratio: 0.03 — ABNORMAL LOW (ref 1.0–3.0)
Total lymphocyte count: 641 /uL — ABNORMAL LOW (ref 1000–4000)

## 2023-11-10 LAB — CBC
HCT: 34.4 % — ABNORMAL LOW (ref 39.0–52.0)
Hemoglobin: 10.8 g/dL — ABNORMAL LOW (ref 13.0–17.0)
MCH: 31.5 pg (ref 26.0–34.0)
MCHC: 31.4 g/dL (ref 30.0–36.0)
MCV: 100.3 fL — ABNORMAL HIGH (ref 80.0–100.0)
Platelets: 199 10*3/uL (ref 150–400)
RBC: 3.43 MIL/uL — ABNORMAL LOW (ref 4.22–5.81)
RDW: 13.8 % (ref 11.5–15.5)
WBC: 5.4 10*3/uL (ref 4.0–10.5)
nRBC: 0 % (ref 0.0–0.2)

## 2023-11-10 LAB — BASIC METABOLIC PANEL WITH GFR
Anion gap: 10 (ref 5–15)
BUN: 19 mg/dL (ref 6–20)
CO2: 21 mmol/L — ABNORMAL LOW (ref 22–32)
Calcium: 8.1 mg/dL — ABNORMAL LOW (ref 8.9–10.3)
Chloride: 102 mmol/L (ref 98–111)
Creatinine, Ser: 0.85 mg/dL (ref 0.61–1.24)
GFR, Estimated: >60 mL/min (ref >60)
Glucose, Bld: 110 mg/dL — ABNORMAL HIGH (ref 70–99)
Potassium: 3.7 mmol/L (ref 3.5–5.1)
Sodium: 133 mmol/L — ABNORMAL LOW (ref 135–145)

## 2023-11-10 LAB — GC/CHLAMYDIA PROBE AMP (~~LOC~~) NOT AT ARMC
Chlamydia: NEGATIVE
Comment: NEGATIVE
Comment: NORMAL
Neisseria Gonorrhea: NEGATIVE

## 2023-11-10 LAB — T.PALLIDUM AB, TOTAL: T Pallidum Abs: REACTIVE — AB

## 2023-11-10 MED ORDER — LIDOCAINE HCL (PF) 1 % IJ SOLN
INTRAMUSCULAR | Status: AC
  Filled 2023-11-10: qty 30

## 2023-11-10 MED ORDER — LIDOCAINE-EPINEPHRINE 1 %-1:100000 IJ SOLN
20.0000 mL | Freq: Once | INTRAMUSCULAR | Status: AC
  Administered 2023-11-10: 20 mL via INTRADERMAL
  Filled 2023-11-10: qty 20

## 2023-11-10 MED ORDER — LIDOCAINE HCL (PF) 1 % IJ SOLN
30.0000 mL | Freq: Once | INTRAMUSCULAR | Status: AC
  Administered 2023-11-10: 10 mL via INTRADERMAL
  Filled 2023-11-10: qty 30

## 2023-11-10 MED ORDER — IOHEXOL 300 MG/ML  SOLN
50.0000 mL | Freq: Once | INTRAMUSCULAR | Status: AC | PRN
Start: 2023-11-10 — End: 2023-11-10
  Administered 2023-11-10: 50 mL via INTRAVENOUS

## 2023-11-10 MED ORDER — FENTANYL CITRATE (PF) 100 MCG/2ML IJ SOLN
INTRAMUSCULAR | Status: AC
  Filled 2023-11-10: qty 2

## 2023-11-10 MED ORDER — MIDAZOLAM HCL 2 MG/2ML IJ SOLN
INTRAMUSCULAR | Status: AC
  Filled 2023-11-10: qty 2

## 2023-11-10 MED ORDER — LIDOCAINE-EPINEPHRINE 1 %-1:100000 IJ SOLN
INTRAMUSCULAR | Status: AC
Start: 2023-11-10 — End: ?
  Filled 2023-11-10: qty 1

## 2023-11-10 MED ORDER — IOHEXOL 300 MG/ML  SOLN
50.0000 mL | Freq: Once | INTRAMUSCULAR | Status: AC | PRN
  Administered 2023-11-10: 20 mL

## 2023-11-10 MED ORDER — DIPHENHYDRAMINE HCL 50 MG/ML IJ SOLN
INTRAMUSCULAR | Status: DC | PRN
  Administered 2023-11-10: 25 mg via INTRAVENOUS

## 2023-11-10 MED ORDER — FENTANYL CITRATE (PF) 100 MCG/2ML IJ SOLN
INTRAMUSCULAR | Status: DC | PRN
  Administered 2023-11-10 (×2): 50 ug via INTRAVENOUS

## 2023-11-10 MED ORDER — MIDAZOLAM HCL 2 MG/2ML IJ SOLN
INTRAMUSCULAR | Status: DC | PRN
  Administered 2023-11-10 (×4): 1 mg via INTRAVENOUS

## 2023-11-10 MED ORDER — DIPHENHYDRAMINE HCL 50 MG/ML IJ SOLN
INTRAMUSCULAR | Status: AC
  Filled 2023-11-10: qty 1

## 2023-11-10 NOTE — Progress Notes (Signed)
 S: Patient without complaint.  Hungry and wants to eat but n.p.o. for left nephrostomy tube.  O:  Vitals:   11/09/23 2000 11/10/23 0640  BP: 112/85 107/73  Pulse: 68 (!) 54  Resp: 18 18  Temp: 98.6 F (37 C) 98.7 F (37.1 C)  SpO2: 100% 96%    Patient alert and oriented watching TV, Abdomen soft and nontender Foley in place with clear urine Extremity without calf pain or swelling  CBC    Component Value Date/Time   WBC 5.4 11/10/2023 0359   RBC 3.43 (L) 11/10/2023 0359   HGB 10.8 (L) 11/10/2023 0359   HGB 9.5 (L) 07/24/2023 0614   HCT 34.4 (L) 11/10/2023 0359   HCT 38.4 11/15/2021 1428   PLT 199 11/10/2023 0359   PLT 285 11/15/2021 1428   MCV 100.3 (H) 11/10/2023 0359   MCV 94 11/15/2021 1428   MCV 101 (H) 09/27/2011 0740   MCH 31.5 11/10/2023 0359   MCHC 31.4 11/10/2023 0359   RDW 13.8 11/10/2023 0359   RDW 13.5 11/15/2021 1428   RDW 13.7 09/27/2011 0740   LYMPHSABS 0.3 (L) 11/08/2023 0411   LYMPHSABS 1.5 11/15/2021 1428   LYMPHSABS 1.3 09/27/2011 0740   MONOABS 0.3 11/08/2023 0411   MONOABS 0.5 09/27/2011 0740   EOSABS 0.0 11/08/2023 0411   EOSABS 0.2 11/15/2021 1428   EOSABS 0.0 09/27/2011 0740   BASOSABS 0.0 11/08/2023 0411   BASOSABS 0.1 11/15/2021 1428   BASOSABS 0.0 09/27/2011 0740   Lab Results  Component Value Date   CREATININE 0.85 11/10/2023   CREATININE 1.29 (H) 11/08/2023   CREATININE 1.40 (H) 10/31/2023   A/P: Chronic prostatic abscess s/p unroofing -Foley initially in prostatic urethra for hemostasis advanced in the bladder 11/08/2023.  Continue Foley through 11/15/2023.   Bilateral hydronephrosis with CKD.  Status post right stent.  For left nephrostomy tube.      Chronic cystitis, HIV-He remains on Ertapenem  and antivirals per ID.

## 2023-11-10 NOTE — TOC Initial Note (Signed)
 Transition of Care [facility]) - Initial/Assessment Note    Patient Details  Name: MOHID FURUYA MRN: [id number] Date of Birth: [birth year]  Transition of Care [facility] CM/SW Contact:    Gertha Ku, LCSW Phone Number: 11/10/2023, 4:31 PM  Clinical Narrative:                  Attempted to see pt for initial assessment was not in room TOC will follow up.        Patient Goals and CMS Choice            Expected Discharge Plan and Services                                              Prior Living Arrangements/Services                       Activities of Daily Living   ADL Screening (condition at time of admission) Independently performs ADLs?: Yes (appropriate for developmental age) Is the patient deaf or have difficulty hearing?: No Does the patient have difficulty seeing, even when wearing glasses/contacts?: No Does the patient have difficulty concentrating, remembering, or making decisions?: No  Permission Sought/Granted                  Emotional Assessment              Admission diagnosis:  Prostate abscess [N41.2] Bilateral hydronephrosis [N13.30] Prostatic abscess [N41.2] Patient Active Problem List   Diagnosis Date Noted   Prostatic abscess 11/07/2023   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 coli) (HCC) 03/04/2021   GERD (gastroesophageal reflux disease) 03/04/2021   Nonadherence to medical treatment    AKI (acute kidney injury) (HCC) 02/10/2021   Loose stools    Coronavirus infection 08/25/2020   Healthcare maintenance 07/23/2020   Pancytopenia  (HCC) 09/20/2018   Avoidance coping 09/07/2018   UTI due to extended-spectrum beta lactamase (ESBL) producing Escherichia coli 07/19/2018   Headache 04/27/2018   Condyloma 04/24/2018   Protein-calorie malnutrition, severe 12/12/2017   Personal history of MRSA (methicillin resistant Staphylococcus aureus) 12/09/2017   History of ESBL E. coli infection 12/09/2017   AIDS (acquired immune deficiency syndrome) (HCC)    Homelessness    Dysplasia of anus 01/02/2014   Tobacco use disorder 01/02/2014   Human immunodeficiency virus (HIV) disease (HCC) 01/01/2014   PCP:  Pcp, No Pharmacy:   [facility] [address] [place] [address] Phone: [phone] Fax: [phone]  [id number] Jonette Nestle, [place] - [address] AT [facility] OF [address] [place] [address] Phone: [phone] Fax: [phone]  [facility] [phone] Jonette Nestle, [place] - [address] AT [address] [place] [address] Phone: [phone] Fax: [phone]  [facility] - [place], [place] - [address] [place] [address] Phone: [phone] Fax: [phone]  Social Drivers of Health (SDOH) Social History: SDOH Screenings   Food Insecurity: No Food Insecurity (11/07/2023)  Housing: High Risk (11/07/2023)  Transportation Needs: No Transportation Needs (11/07/2023)  Utilities: Not At Risk (11/07/2023)  Depression (PHQ2-9): Low Risk  (09/11/2023)  Financial Resource Strain: Low Risk  (06/09/2018)  Physical Activity: Unknown (06/09/2018)  Social Connections: Patient Declined (11/07/2023)  Tobacco Use: High Risk (11/07/2023)   SDOH Interventions:     Readmission Risk Interventions    03/10/2021    2:47 PM  Readmission Risk Prevention Plan  Transportation Screening Complete  PCP or Specialist Appt within 3-5 Days Complete  HRI or Home  Care Consult Complete  Social Work Consult for Recovery Care Planning/Counseling Complete  Palliative Care Screening Complete  Medication Review Oceanographer) Complete

## 2023-11-10 NOTE — Procedures (Addendum)
 Vascular and Interventional Radiology Procedure Note  Patient: Jacob Gutierrez DOB: 01-16-1986 Medical Record Number: [id number] Note Date/Time: 11/10/23 4:28 PM   Performing Physician: Art Largo, MD Assistant(s): None  Diagnosis: R ureteral obstruction  Procedure:  RIGHT NEPHROURETERAL TUBE PLACEMENT RIGHT ANTEROGRADE NEPHROSTOGRAM  Anesthesia: Conscious Sedation Complications: None Estimated Blood Loss: Minimal Specimens:  None  Findings:  Successful placement of a 10 F 26 cm nephroureteral tube into the right kidney(s).   Plan: Drain was placed to gravity drainage. OK to begin capping trial after 24-48 hrs Follow up for routine NU exchange, possible internalization in 6-8 week(s).   See detailed procedure note with images in PACS. The patient tolerated the procedure well without incident or complication and was returned to Recovery in stable condition.    Art Largo, MD Vascular and Interventional Radiology Specialists [facility] Radiology   Pager. [phone] Clinic. [phone]

## 2023-11-10 NOTE — Progress Notes (Signed)
 [facility] Infectious Diseases Follow Up Note  Patient Identification: Patient Name: Jacob Gutierrez MRN: [id number] Admit Date: 11/07/2023  7:51 AM Age: 38 y.o.Today's Date: 11/10/2023  Reason for Visit: Chronic prostatitic abscess, chronic cystitis  Principal Problem:   Prostatic abscess  Antibiotics:  Ertapenem  postop till now   Lines/Hardware:  Interval Events: Remains afebrile Labs remarkable for NA 133, WBC 5.4, hemoglobin 10.8  Assessment 38 year old male with prior history as below including AIDS with poor compliance, Anal dysplasia, substance abuse, Cryptosporidium gastroenteritis, Shigella gastroenteritis, history of pulmonary coccidioidomycosis, monkeypox,  strep pyogenes bacteremia, CVA, syphilis status posttreatment, HTN, history of ESBL E. coli UTI/bacteremia with history of prostatic abscess being unroofed by Dr. Dulcy Gibney in 2019 as well drainage of perineal abscess by Dr. Ottelin in 11/19 who is directly admitted by urology Dr. Ranae Burrow for drainage of chronic prostatic abscess  Recently admitted with 12/26-1/16 sepsis secondary to complicated UTI.  Urine culture with ESBL E. coli and was treated with ertapenem  inpatient and discharged on omadacycline  for 2 weeks.  Workup was also found to have evidence of a prostate bladder communication and was recommended to follow-up with [facility] for urology evaluation.  # Chronic prostatic abscess  4/16 S/p cystoscopy with bilateral retrograde pyelogram, right ureteral stent insertion, attempted left ureteral stent insertion, transurethral unroofing of chronic prostatic abscess.  Complicated with posterior prostatic perforation.  No culture sent during OR. Path was suggestive of acute and chronic inflammation.   # Bilateral hydronephrosis with AKI on CKD - s/p rt ureteral stent, creatinine down to 0.85 - 4/18 US  kidneys Moderate volume LEFT hydronephrosis. RIGHT kidney decompressed with  stent within the renal pelvis. Pelviectasis without hydronephrosis. - IR consulted for left nephrostomy   # Syphilis - s/p prior tx, RPR down to 1: 8   # Thrombocytopenia  - resolved    # AIDs  - still with poor compliance, CD4 <35, 2.3% Lab Results  Component Value Date   HIV1RNAQUANT 7,360 (H) 09/11/2023    Plan - Continue ertapenem , expect he will need approx 4 weeks of antibiotics, Omadacycline  a feasible option for discharge based on prior sensitivity - Follow-up with IR plans for nephrostomy - Fu urine GC, HIV RNA - Monitor CBC and CMP on antibiotics - Contact precautions Dr. Zelda Hickman here this weekend with urgent questions.  New ID team starting Monday  Rest of the management as per the primary team. Thank you for the consult. Please page with pertinent questions or concerns.  ______________________________________________________________________ Subjective patient seen and examined at the bedside.  No complaints  Vitals BP 107/73 (BP Location: Right Arm)   Pulse (!) 54   Temp 98.7 F (37.1 C)   Resp 18   Ht 6\' 1"  (1.854 m)   Wt 89.3 kg   SpO2 96%   BMI 25.97 kg/m     Physical Exam Constitutional: Adult male lying in the bed nontoxic appearing    Comments: HEENT WNL  Cardiovascular:     Rate and Rhythm: Normal rate and regular rhythm.     Heart sounds:   Pulmonary:     Effort: Pulmonary effort is normal.     Comments:   Abdominal:     Palpations: Abdomen is soft.     Tenderness: Nondistended, Foley is draining clear urine  Musculoskeletal:        General: No swelling or tenderness in peripheral joints  Skin:    Comments: No rashes  Neurological:     General: Awake, alert and oriented, grossly nonfocal  Psychiatric:        Mood and Affect: Mood normal.    Pertinent Microbiology Results for orders placed or performed during the hospital encounter of 07/20/23  Culture, blood (Routine x 2)     Status: None   Collection Time: 07/20/23  1:10  PM   Specimen: BLOOD LEFT HAND  Result Value Ref Range Status   Specimen Description BLOOD LEFT HAND  Final   Special Requests   Final    BOTTLES DRAWN AEROBIC AND ANAEROBIC Blood Culture results may not be optimal due to an inadequate volume of blood received in culture bottles   Culture   Final    NO GROWTH 5 DAYS Performed at [facility], [address] [place], [place] [address]    Report Status 07/25/2023 FINAL  Final  Urine Culture     Status: Abnormal   Collection Time: 07/20/23  3:13 PM   Specimen: Urine, Clean Catch  Result Value Ref Range Status   Specimen Description URINE, CLEAN CATCH  Final   Special Requests NONE Reflexed from [id number]  Final   Culture (A)  Final    >=100,000 COLONIES/mL ESCHERICHIA COLI CORRECTED ON 12/30 AT 1191: PREVIOUSLY REPORTED AS >=100,000 COLONIES/mL GRAM NEGATIVE RODS Confirmed Extended Spectrum Beta-Lactamase Producer (ESBL).  In bloodstream infections from ESBL organisms, carbapenems are preferred over piperacillin /tazobactam. They are shown to have a lower risk of mortality. See Scanned report in  Link Performed at [facility], [address] [place], [place] [address]    Report Status 07/24/2023 FINAL  Final   Organism ID, Bacteria ESCHERICHIA COLI (A)  Final      Susceptibility   Escherichia coli - MIC*    AMPICILLIN >=32 RESISTANT Resistant     CEFAZOLIN  >=64 RESISTANT Resistant     CEFEPIME  16 RESISTANT Resistant     CEFTRIAXONE  >=64 RESISTANT Resistant     CIPROFLOXACIN  >=4 RESISTANT Resistant     GENTAMICIN  <=1 SENSITIVE Sensitive     IMIPENEM <=0.25 SENSITIVE Sensitive     NITROFURANTOIN  <=16 SENSITIVE Sensitive     TRIMETH /SULFA  >=320 RESISTANT Resistant     AMPICILLIN/SULBACTAM 4 SENSITIVE Sensitive     PIP/TAZO <=4 SENSITIVE Sensitive ug/mL    * >=100,000 COLONIES/mL ESCHERICHIA COLI CORRECTED ON 12/30 AT 5621: PREVIOUSLY REPORTED AS >=100,000 COLONIES/mL GRAM NEGATIVE RODS  Respiratory (~20  pathogens) panel by PCR     Status: Abnormal   Collection Time: 07/20/23  6:06 PM   Specimen: Nasopharyngeal Swab; Respiratory  Result Value Ref Range Status   Adenovirus NOT DETECTED NOT DETECTED Final   Coronavirus 229E NOT DETECTED NOT DETECTED Final    Comment: (NOTE) The Coronavirus on the Respiratory Panel, DOES NOT test for the novel  Coronavirus (2019 nCoV)    Coronavirus HKU1 NOT DETECTED NOT DETECTED Final   Coronavirus NL63 NOT DETECTED NOT DETECTED Final   Coronavirus OC43 DETECTED (A) NOT DETECTED Final   Metapneumovirus NOT DETECTED NOT DETECTED Final   Rhinovirus / Enterovirus NOT DETECTED NOT DETECTED Final   Influenza A NOT DETECTED NOT DETECTED Final   Influenza B NOT DETECTED NOT DETECTED Final   Parainfluenza Virus 1 NOT DETECTED NOT DETECTED Final   Parainfluenza Virus 2 NOT DETECTED NOT DETECTED Final   Parainfluenza Virus 3 NOT DETECTED NOT DETECTED Final   Parainfluenza Virus 4 NOT DETECTED NOT DETECTED Final   Respiratory Syncytial Virus NOT DETECTED NOT DETECTED Final   Bordetella pertussis NOT DETECTED NOT DETECTED Final   Bordetella  Parapertussis NOT DETECTED NOT DETECTED Final   Chlamydophila pneumoniae NOT DETECTED NOT DETECTED Final   Mycoplasma pneumoniae NOT DETECTED NOT DETECTED Final    Comment: Performed at [facility], [address], [place], [place] [address]  Blastomyces Antigen     Status: None   Collection Time: 07/20/23  9:54 PM   Specimen: Blood  Result Value Ref Range Status   Blastomyces Antigen None Detected None Detected ng/mL Final    Comment: (NOTE) Reference Interval: None Detected Reportable Range: 0.31 ng/mL - 20.00 ng/mL Results above 20.00 ng/mL are reported as 'Positive, Above the Limit of Quantification' This test was developed and its performance characteristics determined by [employer]. It has not been cleared or approved by the FDA; however, FDA clearance or approval is not currently required for  clinical use. The results are not intended to be used as the sole means for clinical diagnosis or patient decisions.    Interpretation Negative  Final   Specimen Type SERUM  Final    Comment: (NOTE) Performed At: [facility] [address] [place], [place] [id number] Mosetta Areola MD [id number]   MRSA Next Gen by PCR, Nasal     Status: None   Collection Time: 07/21/23 11:47 AM   Specimen: Nasal Mucosa; Nasal Swab  Result Value Ref Range Status   MRSA by PCR Next Gen NOT DETECTED NOT DETECTED Final    Comment: (NOTE) The GeneXpert MRSA Assay (FDA approved for NASAL specimens only), is one component of a comprehensive MRSA colonization surveillance program. It is not intended to diagnose MRSA infection nor to guide or monitor treatment for MRSA infections. Test performance is not FDA approved in patients less than 35 years old. Performed at [facility], [address] [place], [place] [address]   MIC (1 Drug)-     Status: None   Collection Time: 07/25/23  2:06 PM  Result Value Ref Range Status   Min Inhibitory Conc (1 Drug) REFERT  Final    Comment: (NOTE) Please refer to the following specimen for additional lab results. Please refer to spec # [id number] for test result. Smith Dunk notified 07-28-2023 at 17:55 AM. Performed At: [facility] [address] [place], [place] [id number] Pearlean Botts MD UV:[id number]    Source ECOL CESBL  Final    Comment: Performed at [facility], [address] [place], [place] [address]    Pertinent Lab.    Latest Ref Rng & Units 11/10/2023    3:59 AM 11/08/2023    4:11 AM 10/31/2023    2:16 PM  CBC  WBC 4.0 - 10.5 K/uL 5.4  5.5  3.7   Hemoglobin 13.0 - 17.0 g/dL 56.3  9.3  87.5   Hematocrit 39.0 - 52.0 % 34.4  28.1  34.7   Platelets 150 - 400 K/uL 199  134  158    ,    Latest Ref Rng & Units 11/10/2023    3:59 AM 11/08/2023    4:11 AM 10/31/2023    2:16 PM  CMP  Glucose 70 - 99 mg/dL  643  329  518   BUN 6 - 20 mg/dL 19  18  16    Creatinine 0.61 - 1.24 mg/dL 8.41  6.60  6.30   Sodium 135 - 145 mmol/L 133  130  132   Potassium 3.5 - 5.1 mmol/L 3.7  4.0  3.4   Chloride 98 - 111 mmol/L 102  104  104  CO2 22 - 32 mmol/L 21  21  21    Calcium  8.9 - 10.3 mg/dL 8.1  8.0  8.1     Pertinent Imaging today Plain films and CT images have been personally visualized and interpreted; radiology reports have been reviewed. Decision making incorporated into the Impression /   US  RENAL Result Date: 11/09/2023 CLINICAL DATA:  [id number] Ureteral obstruction, left [id number] EXAM: RENAL / URINARY TRACT ULTRASOUND COMPLETE COMPARISON:  US  RENAL, 07/20/2023. CT AP, 07/24/2023 and 12/08/2021. FINDINGS: Suboptimal evaluation, with poor acoustic penetration secondary to patient habitus. Right Kidney: Renal measurements: 13.4 x 6.7 x 6.8 cm (volume = 320 cm^3). Echogenicity within normal limits. No mass. Stent tip coiled at the RIGHT renal pelvis. Mild pelviectasis without overt hydronephrosis. Left Kidney: Renal measurements: 12.8 x 8.0 x 6.2 cm (volume = 330 cm^3). Echogenicity within normal limits. No mass. Moderate volume of hydronephrosis visualized. Bladder: Decompressed urinary bladder with Foley catheter visualized Other: No free abdominopelvic fluid IMPRESSION: 1. Moderate volume LEFT hydronephrosis 2. RIGHT kidney decompressed with stent within the renal pelvis. Pelviectasis without hydronephrosis. Electronically Signed   By: Art Largo M.D.   On: 11/09/2023 07:05   DG C-Arm 1-60 Min-No Report Result Date: 11/07/2023 Fluoroscopy was utilized by the requesting physician.  No radiographic interpretation.    I have personally spent 50 minutes involved in face-to-face and non-face-to-face activities for this patient on the day of the visit. Professional time spent includes the following activities: Preparing to see the patient (review of tests), Obtaining and/or reviewing separately obtained history  (admission/discharge record), Performing a medically appropriate examination and/or evaluation , Ordering medications/tests/procedures, referring and communicating with other health care professionals, Documenting clinical information in the EMR, Independently interpreting results (not separately reported), Communicating results to the patient/family/caregiver, Counseling and educating the patient/family/caregiver and Care coordination (not separately reported).   Plan d/w requesting provider as well as ID pharm D  Of note, portions of this note may have been created with voice recognition software. While this note has been edited for accuracy, occasional wrong-word or 'sound-a-like' substitutions may have occurred due to the inherent limitations of voice recognition software.   Electronically signed by:   Terre Ferri, MD Infectious Disease Physician [facility] for Infectious Disease Pager: [phone]

## 2023-11-11 LAB — BASIC METABOLIC PANEL WITH GFR
Anion gap: 7 (ref 5–15)
BUN: 21 mg/dL — ABNORMAL HIGH (ref 6–20)
CO2: 25 mmol/L (ref 22–32)
Calcium: 8.5 mg/dL — ABNORMAL LOW (ref 8.9–10.3)
Chloride: 105 mmol/L (ref 98–111)
Creatinine, Ser: 1.13 mg/dL (ref 0.61–1.24)
GFR, Estimated: >60 mL/min (ref >60)
Glucose, Bld: 100 mg/dL — ABNORMAL HIGH (ref 70–99)
Potassium: 3.5 mmol/L (ref 3.5–5.1)
Sodium: 137 mmol/L (ref 135–145)

## 2023-11-11 LAB — CBC
HCT: 37 % — ABNORMAL LOW (ref 39.0–52.0)
Hemoglobin: 11.8 g/dL — ABNORMAL LOW (ref 13.0–17.0)
MCH: 31.6 pg (ref 26.0–34.0)
MCHC: 31.9 g/dL (ref 30.0–36.0)
MCV: 98.9 fL (ref 80.0–100.0)
Platelets: 226 10*3/uL (ref 150–400)
RBC: 3.74 MIL/uL — ABNORMAL LOW (ref 4.22–5.81)
RDW: 13.6 % (ref 11.5–15.5)
WBC: 5.8 10*3/uL (ref 4.0–10.5)
nRBC: 0.3 % — ABNORMAL HIGH (ref 0.0–0.2)

## 2023-11-11 NOTE — Plan of Care (Signed)

## 2023-11-11 NOTE — Progress Notes (Signed)
 Perc tube and foley draining and clear Cr 1.13  No pain Continue with medical care Urology will follow

## 2023-11-12 LAB — BASIC METABOLIC PANEL WITH GFR
Anion gap: 6 (ref 5–15)
BUN: 23 mg/dL — ABNORMAL HIGH (ref 6–20)
CO2: 25 mmol/L (ref 22–32)
Calcium: 8.3 mg/dL — ABNORMAL LOW (ref 8.9–10.3)
Chloride: 104 mmol/L (ref 98–111)
Creatinine, Ser: 1.13 mg/dL (ref 0.61–1.24)
GFR, Estimated: >60 mL/min (ref >60)
Glucose, Bld: 97 mg/dL (ref 70–99)
Potassium: 3.5 mmol/L (ref 3.5–5.1)
Sodium: 135 mmol/L (ref 135–145)

## 2023-11-12 LAB — CBC
HCT: 35.8 % — ABNORMAL LOW (ref 39.0–52.0)
Hemoglobin: 11.3 g/dL — ABNORMAL LOW (ref 13.0–17.0)
MCH: 31.9 pg (ref 26.0–34.0)
MCHC: 31.6 g/dL (ref 30.0–36.0)
MCV: 101.1 fL — ABNORMAL HIGH (ref 80.0–100.0)
Platelets: 239 10*3/uL (ref 150–400)
RBC: 3.54 MIL/uL — ABNORMAL LOW (ref 4.22–5.81)
RDW: 13.5 % (ref 11.5–15.5)
WBC: 5.1 10*3/uL (ref 4.0–10.5)
nRBC: 0.8 % — ABNORMAL HIGH (ref 0.0–0.2)

## 2023-11-12 NOTE — Plan of Care (Signed)

## 2023-11-12 NOTE — Progress Notes (Signed)
 Referring Physician(s): Venetta Gill  Supervising Physician: Fernando Hoyer  Patient Status:  [facility] - In-pt  Chief Complaint: Left hydronephrosis, prostatic abscess causing ureteral obstruction, recent right ureteral stenting ; s/p left nephroureteral catheter placement 4/18   Subjective: Pt doing ok this am; has some mild left flank soreness ; no N/V or fever   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)    GERD (gastroesophageal reflux disease)    H/O coccidioidomycosis    pulmonary    HIV disease (HCC)    Human monkeypox    Past history of allergy to penicillin -type antibiotic 07/03/2012   desensitization   Shigella gastroenteritis    Stroke [facility])    Syphilis    history /treated    Syphilis, unspecified 09/12/2016   Past Surgical History:  Procedure Laterality Date   BIOPSY  10/16/2020   Procedure: BIOPSY;  Surgeon: Normie Becton., MD;  Location: [facility] ENDOSCOPY;  Service: Gastroenterology;;   BIOPSY  10/23/2020   Procedure: BIOPSY;  Surgeon: Lajuan Pila, MD;  Location: [facility] ENDOSCOPY;  Service: Endoscopy;;   COLONOSCOPY WITH PROPOFOL  N/A 10/23/2020   Procedure: COLONOSCOPY WITH PROPOFOL ;  Surgeon: Lajuan Pila, MD;  Location: [facility] ENDOSCOPY;  Service: Endoscopy;  Laterality: N/A;   ESOPHAGOGASTRODUODENOSCOPY (EGD) WITH PROPOFOL  N/A 10/16/2020   Procedure: ESOPHAGOGASTRODUODENOSCOPY (EGD) WITH PROPOFOL ;  Surgeon: Brice Campi Albino Alu., MD;  Location: [facility] ENDOSCOPY;  Service: Gastroenterology;  Laterality: N/A;   IR  NEPHROURETERAL CATH PLACE LEFT  11/10/2023   TRANSURETHRAL RESECTION OF PROSTATE N/A 12/09/2017   Procedure: TRANSURETHRAL RESECTION OF THE PROSTATE (TURP);  Surgeon: Andrez Banker, MD;   Location: [facility] ORS;  Service: Urology;  Laterality: N/A;   TRANSURETHRAL RESECTION OF PROSTATE N/A 11/07/2023   Procedure: TURP (TRANSURETHRAL RESECTION OF PROSTATE);  Surgeon: Homero Luster, MD;  Location: [facility] ORS;  Service: Urology;  Laterality: N/A;  TRANSURETHRAL RESECTION OF PROSTATIC ABSCESS WITHCYSTO AND  BILATERAL RETROGRADES      Allergies: Vancomycin  and Amoxicillin  Medications: Prior to Admission medications   Medication Sig Start Date End Date Taking? Authorizing Provider  acetaminophen  (TYLENOL ) 500 MG tablet Take 1 tablet (500 mg total) by mouth every 6 (six) hours as needed. 10/27/22  Yes Deatra Face, MD  dapsone  100 MG tablet Take 1 tablet (100 mg total) by mouth daily. 09/11/23  Yes Calone, Gregory D, FNP  Darunavir -Cobicistat-Emtricitabine -Tenofovir  Alafenamide (SYMTUZA ) 800-150-200-10 MG TABS Take 1 tablet by mouth daily with breakfast. 09/11/23  Yes Calone, Gregory D, FNP  omeprazole  (PRILOSEC) 40 MG capsule Take 40 mg by mouth daily.   Yes [provider]  sodium bicarbonate  650 MG tablet Take 1,300 mg by mouth 2 (two) times daily.   Yes [provider]  tamsulosin  (FLOMAX ) 0.4 MG CAPS capsule TAKE 1 CAPSULE(0.4 MG) BY MOUTH DAILY AFTER SUPPER Patient taking differently: Take 0.4 mg by mouth in the morning. 10/09/23  Yes Bubba Carbo, FNP     Vital Signs: BP 104/77 (BP Location: Right Arm)   Pulse 70   Temp 97.6 F (36.4 C) (Oral)   Resp 20   Ht 6\' 1"  (1.854 m)   Wt 196 lb 13.9 oz (89.3 kg)   SpO2 95%   BMI 25.97 kg/m   Physical Exam awake/alert; left NU cath  intact, dressing dry, site mildly tender, OP 325 cc yellow urine  Imaging: IR  NEPHROURETERAL CATH PLACE LEFT Result Date: 11/10/2023 INDICATION: Hydronephrosis. Briefly, 38 year old male with a history of ureteral obstruction s/p aborted retrograde LEFT ureteral stent. EXAM: Procedures; 1. LEFT ANTEGRADE NEPHROSTOGRAM 2. ULTRASOUND AND FLUOROSCOPIC GUIDED LEFT NEPHROURETERAL CATHETER  PLACEMENT COMPARISON:  US  Renal, 11/07/2023. OR fluoroscopy, 11/07/2023. CT CAP, 10/22/2022. MEDICATIONS: The patient was on scheduled IV antibiotics. ANESTHESIA/SEDATION: Moderate (conscious) sedation was employed during this procedure. A total of Versed  4 mg and Fentanyl  100 mcg was administered intravenously. Moderate Sedation Time: 44 minutes. The patient's level of consciousness and vital signs were monitored continuously by radiology nursing throughout the procedure under my direct supervision. CONTRAST:  50 mL OMNIPAQUE  IOHEXOL  300 MG/ML SOLN, administered IV. 20 mL OMNIPAQUE  IOHEXOL  300 MG/ML SOLN, administered into the renal collecting system FLUOROSCOPY TIME:  Fluoroscopic dose; 18 mGy COMPLICATIONS: None immediate. PROCEDURE: Informed written consent was obtained from the patient after a discussion of the risks, benefits, and alternatives to treatment. The LEFT flank region was prepped with sterile prep in a usual fashion, and a sterile drape was applied covering the operative field. A sterile gown and sterile gloves were used for the procedure. A timeout was performed prior to the initiation of the procedure. A pre procedural spot fluoroscopic image was obtained of the upper abdomen. Ultrasound scanning performed of the kidney was negative for significant hydronephrosis. As such, the stone within inferior pole was targeted using a combination of ultrasound and fluoroscopically with a 22 gauge Chiba needle. Access to the collecting system was confirmed with advancement of a Nitrex wire into the collecting system. The needle was exchanged for the inner 3 Fr catheter from an Accustick set and contrast injection confirmed access. An Accustick set was utilized to dilate the tract and was subsequently exchanged for a Kumpe catheter over a Bentson wire. The Kumpe catheter was advanced down the ureter and into the urinary bladder. Next, over an Amplatz wire, the track was further dilated ultimately allowing  placement of a 10 Fr, 26 cm percutaneous nephroureteral catheter with end coiled and locked within the urinary bladder. Contrast was injected and several spot fluoroscopic images were obtained in various obliquities. The catheter was secured at the skin entrance site with an interrupted suture and a stat lock device and connected to a gravity bag. Dressings were applied. The patient tolerated procedure well without immediate postprocedural complication. FINDINGS: Ultrasound scanning demonstrates a moderate dilated LEFT collecting system. Under a combination of ultrasound and fluoroscopic guidance, a posterior inferior calix was targeted, and access the urinary bladder obtained allowing placement of a 10 Fr, 26 cm percutaneous nephroureteral catheter with end coiled and locked within the urinary bladder. Contrast injection confirmed appropriate positioning. IMPRESSION: Successful placement of a LEFT 10 Fr, 26 cm nephroureteral drainage catheter, with tip at the level of the urinary bladder. RECOMMENDATIONS: The catheter was placed to gravity drainage, with anticipated capping trial after 24-48 hours. The patient will return to Vascular Interventional Radiology (VIR) for routine drainage catheter evaluation and exchange in 6-8 weeks. Art Largo, MD Vascular and Interventional Radiology Specialists [facility] Radiology Electronically Signed   By: Art Largo M.D.   On: 11/10/2023 17:24    Labs:  CBC: Recent Labs    11/08/23 0411 11/10/23 0359 11/11/23 0418 11/12/23 0358  WBC 5.5 5.4 5.8 5.1  HGB 9.3* 10.8* 11.8* 11.3*  HCT 28.1* 34.4* 37.0* 35.8*  PLT 134* 199 226 239    COAGS: Recent  Labs    07/20/23 1310 07/20/23 1455 11/07/23 1514  INR 1.2  --  1.2  APTT  --  32  --     BMP: Recent Labs    11/08/23 0411 11/10/23 0359 11/11/23 0418 11/12/23 0358  NA 130* 133* 137 135  K 4.0 3.7 3.5 3.5  CL 104 102 105 104  CO2 21* 21* 25 25  GLUCOSE 152* 110* 100* 97  BUN 18 19 21* 23*   CALCIUM  8.0* 8.1* 8.5* 8.3*  CREATININE 1.29* 0.85 1.13 1.13  GFRNONAA >60 >60 >60 >60    LIVER FUNCTION TESTS: Recent Labs    07/21/23 1159 07/22/23 0610 07/23/23 0554 07/24/23 0614 09/11/23 1528  BILITOT 0.5 0.5 0.5 0.3 0.4  AST 19 16 28  182* 17  ALT 11 11 14  82* 13  ALKPHOS 40 39 39 50  --   PROT 7.7 7.2 7.9 7.7 8.8*  ALBUMIN  1.9* 1.9* 2.0* 2.1*  --     Assessment and Plan: Pt with hx HIV, CKD, chronic prostatic abscess, prior rt ureteral stenting, bilat hydronephrosis; s/p left NU cath 4/18; afebrile, WBC nl, hgb stable, creat 1.13, latest urine cx neg to date; cont current tx ; consider NU capping trial next week;follow up for routine NU exchange, possible internalization in 6-8 week(s).    Electronically Signed: D. Honore Lux, PA-C 11/12/2023, 10:48 AM   I spent a total of 15 Minutes at the the patient's bedside AND on the patient's hospital floor or unit, greater than 50% of which was counseling/coordinating care for left nephroureteral catheter    Patient ID: Jacob Gutierrez, male   DOB: May 14, 1986, 38 y.o.   MRN: [id number]

## 2023-11-12 NOTE — Progress Notes (Signed)
 5 Days Post-Op Subjective: No acute events overnight.  Kidney function stable.  Pain well-controlled  Objective: Vital signs in last 24 hours: Temp:  [97.6 F (36.4 C)-98.3 F (36.8 C)] 97.6 F (36.4 C) (04/20 0622) Pulse Rate:  [70-109] 70 (04/20 0622) Resp:  [16-20] 20 (04/20 0622) BP: (104-125)/(77-87) 104/77 (04/20 0622) SpO2:  [95 %-96 %] 95 % (04/20 0622)  Intake/Output from previous day: 04/19 0701 - 04/20 0700 In: 1616.1 [P.O.:1320; IV Piggyback:296.1] Out: 1825 [Urine:1825] Intake/Output this shift: No intake/output data recorded.  Physical Exam:  General: Alert and oriented CV: RRR Lungs: Clear Abdomen: Soft, ND, ATTP Ext: NT, No erythema  Foley in place yellow urine, left neph tube in place draining clear yellow urine as well.  Lab Results: Recent Labs    11/10/23 0359 11/11/23 0418 11/12/23 0358  HGB 10.8* 11.8* 11.3*  HCT 34.4* 37.0* 35.8*   BMET Recent Labs    11/11/23 0418 11/12/23 0358  NA 137 135  K 3.5 3.5  CL 105 104  CO2 25 25  GLUCOSE 100* 97  BUN 21* 23*  CREATININE 1.13 1.13  CALCIUM  8.5* 8.3*     Studies/Results: IR  NEPHROURETERAL CATH PLACE LEFT Result Date: 11/10/2023 INDICATION: Hydronephrosis. Briefly, 38 year old male with a history of ureteral obstruction s/p aborted retrograde LEFT ureteral stent. EXAM: Procedures; 1. LEFT ANTEGRADE NEPHROSTOGRAM 2. ULTRASOUND AND FLUOROSCOPIC GUIDED LEFT NEPHROURETERAL CATHETER PLACEMENT COMPARISON:  US  Renal, 11/07/2023. OR fluoroscopy, 11/07/2023. CT CAP, 10/22/2022. MEDICATIONS: The patient was on scheduled IV antibiotics. ANESTHESIA/SEDATION: Moderate (conscious) sedation was employed during this procedure. A total of Versed  4 mg and Fentanyl  100 mcg was administered intravenously. Moderate Sedation Time: 44 minutes. The patient's level of consciousness and vital signs were monitored continuously by radiology nursing throughout the procedure under my direct supervision. CONTRAST:  50 mL  OMNIPAQUE  IOHEXOL  300 MG/ML SOLN, administered IV. 20 mL OMNIPAQUE  IOHEXOL  300 MG/ML SOLN, administered into the renal collecting system FLUOROSCOPY TIME:  Fluoroscopic dose; 18 mGy COMPLICATIONS: None immediate. PROCEDURE: Informed written consent was obtained from the patient after a discussion of the risks, benefits, and alternatives to treatment. The LEFT flank region was prepped with sterile prep in a usual fashion, and a sterile drape was applied covering the operative field. A sterile gown and sterile gloves were used for the procedure. A timeout was performed prior to the initiation of the procedure. A pre procedural spot fluoroscopic image was obtained of the upper abdomen. Ultrasound scanning performed of the kidney was negative for significant hydronephrosis. As such, the stone within inferior pole was targeted using a combination of ultrasound and fluoroscopically with a 22 gauge Chiba needle. Access to the collecting system was confirmed with advancement of a Nitrex wire into the collecting system. The needle was exchanged for the inner 3 Fr catheter from an Accustick set and contrast injection confirmed access. An Accustick set was utilized to dilate the tract and was subsequently exchanged for a Kumpe catheter over a Bentson wire. The Kumpe catheter was advanced down the ureter and into the urinary bladder. Next, over an Amplatz wire, the track was further dilated ultimately allowing placement of a 10 Fr, 26 cm percutaneous nephroureteral catheter with end coiled and locked within the urinary bladder. Contrast was injected and several spot fluoroscopic images were obtained in various obliquities. The catheter was secured at the skin entrance site with an interrupted suture and a stat lock device and connected to a gravity bag. Dressings were applied. The patient tolerated procedure well without  immediate postprocedural complication. FINDINGS: Ultrasound scanning demonstrates a moderate dilated LEFT  collecting system. Under a combination of ultrasound and fluoroscopic guidance, a posterior inferior calix was targeted, and access the urinary bladder obtained allowing placement of a 10 Fr, 26 cm percutaneous nephroureteral catheter with end coiled and locked within the urinary bladder. Contrast injection confirmed appropriate positioning. IMPRESSION: Successful placement of a LEFT 10 Fr, 26 cm nephroureteral drainage catheter, with tip at the level of the urinary bladder. RECOMMENDATIONS: The catheter was placed to gravity drainage, with anticipated capping trial after 24-48 hours. The patient will return to Vascular Interventional Radiology (VIR) for routine drainage catheter evaluation and exchange in 6-8 weeks. Art Largo, MD [facility] Electronically Signed   By: Art Largo M.D.   On: 11/10/2023 17:24    Assessment/Plan: Jacob Gutierrez is POD 4 s/p TUR prostate abscess, R ureteral stent, POD 2 s/p L neph tube  -- Continue Foley, neph tube. --OOB today --on ertapenem  per ID   LOS: 4 days   [name] MD 11/12/2023, 8:29 AM [facility]  Pager: [phone]

## 2023-11-13 ENCOUNTER — Other Ambulatory Visit (HOSPITAL_COMMUNITY): Payer: Self-pay

## 2023-11-13 ENCOUNTER — Other Ambulatory Visit: Payer: Self-pay | Admitting: Interventional Radiology

## 2023-11-13 DIAGNOSIS — N1330 Unspecified hydronephrosis: Secondary | ICD-10-CM

## 2023-11-13 NOTE — Plan of Care (Signed)

## 2023-11-13 NOTE — Progress Notes (Signed)
  IR BRIEF NOTE:  Per patient's Urologist, Dr. Inga Manges, patient's left nephrotomy tube was capped for trial. 200 cc of clear urine in collection bag at the time. Patient was provided with instructions to monitor his left flank for return of discomfort/pain. Should patient experience such symptoms, he knows to remove the cap placed today by simply twisting it off, and re-connecting one of the two collection bags provided. Should the collection bag fail to gradually fill, and/or pain persist or worsen, patient knows to proceed to ED for prompt evaluation. Follow-up with Urology office per Urology recommendations on capping trial. Floor Rn and Dr. Inga Manges were made aware.  Electronically Signed: Lovena Rubinstein, PA-C 11/13/2023, 3:25 PM

## 2023-11-13 NOTE — Progress Notes (Signed)
 6 Days Post-Op Subjective: No acute events overnight.  Kidney function has normalized.  Pain well-controlled.   He has good foley and left NT output of clear urine.   Objective: Vital signs in last 24 hours: Temp:  [97.8 F (36.6 C)-98.4 F (36.9 C)] 98.2 F (36.8 C) (04/21 0630) Pulse Rate:  [79-94] 79 (04/21 0630) Resp:  [16-18] 16 (04/21 0630) BP: (105-120)/(72-82) 111/73 (04/21 0630) SpO2:  [94 %-97 %] 94 % (04/21 0630)  Intake/Output from previous day: 04/20 0701 - 04/21 0700 In: 1259.3 [P.O.:1160; IV Piggyback:99.3] Out: 1800 [Urine:1800] Intake/Output this shift: Total I/O In: -  Out: 150 [Urine:150]  Physical Exam:  General: Alert and oriented   Foley in place yellow urine, left neph tube in place draining clear yellow urine as well.  Lab Results: Recent Labs    11/11/23 0418 11/12/23 0358  HGB 11.8* 11.3*  HCT 37.0* 35.8*   BMET Recent Labs    11/11/23 0418 11/12/23 0358  NA 137 135  K 3.5 3.5  CL 105 104  CO2 25 25  GLUCOSE 100* 97  BUN 21* 23*  CREATININE 1.13 1.13  CALCIUM  8.5* 8.3*     Studies/Results: No results found.   Assessment/Plan: Jacob Gutierrez is POD 5 s/p TUR prostate abscess, R ureteral stent, POD 3 s/p L neph tube  -- Discontinue Foley,  consider capping nephU tube. --OOB today --on ertapenem  per ID.  Will need it for at least 2 weeks.    LOS: 5 days      Patient ID: Jacob Gutierrez, male   DOB: 1986/01/14, 38 y.o.   MRN: [id number]

## 2023-11-14 DIAGNOSIS — N1330 Unspecified hydronephrosis: Secondary | ICD-10-CM | POA: Diagnosis not present

## 2023-11-14 DIAGNOSIS — A539 Syphilis, unspecified: Secondary | ICD-10-CM | POA: Diagnosis not present

## 2023-11-14 DIAGNOSIS — N179 Acute kidney failure, unspecified: Secondary | ICD-10-CM | POA: Diagnosis not present

## 2023-11-14 DIAGNOSIS — N412 Abscess of prostate: Secondary | ICD-10-CM | POA: Diagnosis not present

## 2023-11-14 DIAGNOSIS — N189 Chronic kidney disease, unspecified: Secondary | ICD-10-CM

## 2023-11-14 DIAGNOSIS — Z21 Asymptomatic human immunodeficiency virus [HIV] infection status: Secondary | ICD-10-CM

## 2023-11-14 MED ORDER — NUZYRA 150 MG PO TABS: 2.0000 | ORAL_TABLET | Freq: Every day | ORAL | Status: AC

## 2023-11-14 NOTE — Progress Notes (Signed)
   7 Days Post-Op Subjective: Pt resting comfortably on rounds. Failed capping trial last night, citing severe pain while urinating. This resolved when he was put back to drainage.   Objective: Vital signs in last 24 hours: Temp:  [97.7 F (36.5 C)-98 F (36.7 C)] 98 F (36.7 C) (04/22 0500) Pulse Rate:  [86-110] 89 (04/22 0500) Resp:  [17-19] 19 (04/22 0500) BP: (106-120)/(80-84) 106/84 (04/22 0500) SpO2:  [96 %-98 %] 96 % (04/22 0500)  Assessment/Plan: Jacob Gutierrez is POD 6 s/p TUR prostate abscess, R ureteral stent, POD 4 s/p L neph tube.  Foley out 4/21. Failed capping trial. Excellent UOP from neph tube. Bladder outpt unknown. Nursing asks him to void in the urinal for I&O's and he fails to comply.   Recommend considering for antegrade stent placement with IR, pending their availability.   I have reached out to ID. Once he has an ABX regimen, I will discharge him home.   Intake/Output from previous day: 04/21 0701 - 04/22 0700 In: 1280 [P.O.:1280] Out: 2050 [Urine:2050]  Intake/Output this shift: No intake/output data recorded.  Physical Exam:  General: Alert and oriented CV: No cyanosis Lungs: equal chest rise Gu: left PCN draining clear yellow urine  Lab Results: Recent Labs    11/12/23 0358  HGB 11.3*  HCT 35.8*   BMET Recent Labs    11/12/23 0358  NA 135  K 3.5  CL 104  CO2 25  GLUCOSE 97  BUN 23*  CREATININE 1.13  CALCIUM  8.3*     Studies/Results: No results found.    LOS: 6 days   Alla Ar, NP [facility] Pager: [phone]  11/14/2023, 8:50 AM

## 2023-11-14 NOTE — Progress Notes (Signed)
 Pt complaining of left flank pain that has been increasing over the shift. He states that it gets severe when urinating and asks this RN to connect his nephrostomy tube back to drainage bag. Nephrostomy reconnected to drain at 0050. Yellow/clear urine returned. Plan of care ongoing.

## 2023-11-14 NOTE — Plan of Care (Signed)

## 2023-11-14 NOTE — TOC Initial Note (Signed)
 Transition of Care [facility]) - Initial/Assessment Note    Patient Details  Name: Jacob Gutierrez MRN: [id number] Date of Birth: 01-16-1986  [facility]) CM/SW Contact:    Gertha Ku, LCSW Phone Number: 11/14/2023, 3:12 PM  Clinical Narrative:                 CSW met with the pt to discuss SDOH insecurities. Pt stated that he is currently between housing. He reports that he is staying with a friend. CSW explained that the hospital can only provide a shelter list and cannot secure housing for the pt. The pt is aware of shelters in the area. Pt also reports that he may need transportation at the time of discharge. TOC to follow.  Expected Discharge Plan: Home/Self Care Barriers to Discharge: Continued Medical Work up   Patient Goals and CMS Choice Patient states their goals for this hospitalization and ongoing recovery are:: retrun to friends home          Expected Discharge Plan and Services       Living arrangements for the past 2 months: Single Family Home                                      Prior Living Arrangements/Services Living arrangements for the past 2 months: Single Family Home Lives with:: Self, Friends          Need for Family Participation in Patient Care: No (Comment) Care giver support system in place?: No (comment)      Activities of Daily Living   ADL Screening (condition at time of admission) Independently performs ADLs?: Yes (appropriate for developmental age) Is the patient deaf or have difficulty hearing?: No Does the patient have difficulty seeing, even when wearing glasses/contacts?: No Does the patient have difficulty concentrating, remembering, or making decisions?: No  Permission Sought/Granted                  Emotional Assessment Appearance:: Appears stated age Attitude/Demeanor/Rapport: Gracious Affect (typically observed): Accepting Orientation: : Oriented to Place, Oriented to Self, Oriented  to  Time      Admission diagnosis:  Prostate abscess [N41.2] Bilateral hydronephrosis [N13.30] Prostatic abscess [N41.2] Patient Active Problem List   Diagnosis Date Noted   Prostatic abscess 11/07/2023   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 coli) (HCC) 03/04/2021   GERD (gastroesophageal reflux disease) 03/04/2021   Nonadherence to medical treatment    AKI (acute kidney injury) (HCC) 02/10/2021   Loose stools    Coronavirus infection 08/25/2020   Healthcare maintenance 07/23/2020   Pancytopenia (HCC) 09/20/2018   Avoidance coping 09/07/2018   UTI due to extended-spectrum beta lactamase (ESBL) producing Escherichia coli 07/19/2018   Headache 04/27/2018   Condyloma 04/24/2018   Protein-calorie malnutrition, severe 12/12/2017   Personal history of MRSA (methicillin resistant Staphylococcus aureus) 12/09/2017   History of ESBL E. coli infection 12/09/2017   AIDS (acquired immune deficiency syndrome) (HCC)    Homelessness    Dysplasia of anus 01/02/2014   Tobacco use disorder  01/02/2014   Human immunodeficiency virus (HIV) disease (HCC) 01/01/2014   PCP:  Pcp, No Pharmacy:   [facility] [address] [place] [address] Phone: [phone] Fax: [phone]  [id number] - [place], [place] - [address] AT [facility] OF [address] [place] [address] Phone: [phone] Fax: [phone]  [facility] [phone] [place], [place] - [address] AT [address] [place] [address] Phone:  [phone] Fax: [phone]  [facility] - [place], [place] - [address] [place] [address] Phone: [phone] Fax: [phone]     Social Drivers of Health (SDOH) Social History: SDOH Screenings   Food Insecurity: No Food Insecurity (11/07/2023)  Housing: High Risk (11/07/2023)  Transportation Needs: No Transportation Needs (11/07/2023)  Utilities: Not At Risk (11/07/2023)  Depression (PHQ2-9): Low Risk  (09/11/2023)  Financial Resource Strain: Low Risk  (06/09/2018)  Physical Activity: Unknown (06/09/2018)  Social Connections: Patient Declined (11/07/2023)  Tobacco Use: High Risk (11/07/2023)   SDOH Interventions:     Readmission Risk Interventions    03/10/2021    2:47 PM  Readmission Risk Prevention Plan  Transportation Screening Complete  PCP or Specialist Appt within 3-5 Days Complete  HRI or Home Care Consult Complete  Social Work Consult for Recovery Care Planning/Counseling Complete  Palliative Care Screening Complete  Medication Review Oceanographer) Complete

## 2023-11-14 NOTE — Plan of Care (Signed)

## 2023-11-14 NOTE — Progress Notes (Signed)
 ID Pharmacist Note   15 day supply of omadacycline  provided from manufacturer samples to Mr. Sirico.  I instructed him to take 2 tablets by mouth once a day on an empty stomach early in the morning. He repeated these instructions back to me.   I also added instructions to the AVS and discussed with RN to review antibiotic administration instructions again prior to discharge.    Denson Flake, PharmD, BCPS, BCIDP Infectious Diseases Clinical Pharmacist Phone: [phone] 11/14/2023 2:55 PM

## 2023-11-14 NOTE — Progress Notes (Signed)
 Referring Physician(s): Venetta Gill  Supervising Physician: Creasie Doctor  Patient Status:  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)    GERD (gastroesophageal reflux disease)    H/O coccidioidomycosis    pulmonary    HIV disease (HCC)    Human monkeypox    Past history of allergy to penicillin -type antibiotic 07/03/2012   desensitization   Shigella gastroenteritis    Stroke [facility])    Syphilis    history /treated    Syphilis, unspecified 09/12/2016   Past Surgical History:  Procedure Laterality Date   BIOPSY  10/16/2020   Procedure: BIOPSY;  Surgeon: Normie Becton., MD;  Location: [facility] ENDOSCOPY;  Service: Gastroenterology;;   BIOPSY  10/23/2020   Procedure: BIOPSY;  Surgeon: Lajuan Pila, MD;  Location: [facility] ENDOSCOPY;  Service: Endoscopy;;   COLONOSCOPY WITH PROPOFOL  N/A 10/23/2020   Procedure: COLONOSCOPY WITH PROPOFOL ;  Surgeon: Lajuan Pila, MD;  Location: [facility] ENDOSCOPY;  Service: Endoscopy;  Laterality: N/A;   ESOPHAGOGASTRODUODENOSCOPY (EGD) WITH PROPOFOL  N/A 10/16/2020   Procedure: ESOPHAGOGASTRODUODENOSCOPY (EGD) WITH PROPOFOL ;  Surgeon: Brice Campi Albino Alu., MD;  Location: [facility] ENDOSCOPY;  Service: Gastroenterology;  Laterality: N/A;   IR  NEPHROURETERAL CATH PLACE LEFT  11/10/2023   TRANSURETHRAL RESECTION OF PROSTATE N/A 12/09/2017   Procedure: TRANSURETHRAL RESECTION  OF THE PROSTATE (TURP);  Surgeon: Andrez Banker, MD;  Location: [facility] ORS;  Service: Urology;  Laterality: N/A;   TRANSURETHRAL RESECTION OF PROSTATE N/A 11/07/2023   Procedure: TURP (TRANSURETHRAL RESECTION OF PROSTATE);  Surgeon: Homero Luster, MD;  Location: [facility] ORS;  Service: Urology;  Laterality: N/A;  TRANSURETHRAL RESECTION OF PROSTATIC ABSCESS WITHCYSTO AND  BILATERAL RETROGRADES      Allergies: Vancomycin  and Amoxicillin  Medications: Prior to Admission medications   Medication Sig Start Date End Date Taking? Authorizing Provider  acetaminophen  (TYLENOL ) 500 MG tablet Take 1 tablet (500 mg total) by mouth every 6 (six) hours as needed. 10/27/22  Yes Deatra Face, MD  dapsone  100 MG tablet Take 1 tablet (100 mg total) by mouth daily. 09/11/23  Yes Calone, Gregory D, FNP  Darunavir -Cobicistat-Emtricitabine -Tenofovir  Alafenamide (SYMTUZA ) 800-150-200-10 MG TABS Take 1 tablet by mouth daily with breakfast. 09/11/23  Yes Calone, Gregory D, FNP  Omadacycline  Tosylate (NUZYRA ) 150 MG TABS Take 2 tablets (300 mg total) by mouth daily for 15 days. 11/14/23 11/29/23 Yes Vu, Martie Slaughter, MD  omeprazole  (PRILOSEC) 40 MG capsule Take 40 mg by mouth daily.   Yes [provider]  sodium bicarbonate  650 MG tablet Take 1,300 mg by mouth 2 (two) times daily.   Yes [provider]  tamsulosin  (FLOMAX ) 0.4 MG CAPS capsule TAKE 1 CAPSULE(0.4 MG) BY MOUTH DAILY AFTER SUPPER Patient taking differently: Take 0.4 mg by mouth in the morning. 10/09/23  Yes Bubba Carbo, FNP     Vital Signs: BP 114/80 (BP Location: Right Arm)   Pulse 98   Temp (!) 97.5 F (36.4  C) (Oral)   Resp 18   Ht 6\' 1"  (1.854 m)   Wt 196 lb 13.9 oz (89.3 kg)   SpO2 97%   BMI 25.97 kg/m   Physical Exam awake/alert; left NU cath intact, dressing dry, site not sig tender, OP 750 cc sl blood-tinged urine  Imaging: IR  NEPHROURETERAL CATH PLACE LEFT Result Date: 11/10/2023 INDICATION: Hydronephrosis. Briefly,  38 year old male with a history of ureteral obstruction s/p aborted retrograde LEFT ureteral stent. EXAM: Procedures; 1. LEFT ANTEGRADE NEPHROSTOGRAM 2. ULTRASOUND AND FLUOROSCOPIC GUIDED LEFT NEPHROURETERAL CATHETER PLACEMENT COMPARISON:  US  Renal, 11/07/2023. OR fluoroscopy, 11/07/2023. CT CAP, 10/22/2022. MEDICATIONS: The patient was on scheduled IV antibiotics. ANESTHESIA/SEDATION: Moderate (conscious) sedation was employed during this procedure. A total of Versed  4 mg and Fentanyl  100 mcg was administered intravenously. Moderate Sedation Time: 44 minutes. The patient's level of consciousness and vital signs were monitored continuously by radiology nursing throughout the procedure under my direct supervision. CONTRAST:  50 mL OMNIPAQUE  IOHEXOL  300 MG/ML SOLN, administered IV. 20 mL OMNIPAQUE  IOHEXOL  300 MG/ML SOLN, administered into the renal collecting system FLUOROSCOPY TIME:  Fluoroscopic dose; 18 mGy COMPLICATIONS: None immediate. PROCEDURE: Informed written consent was obtained from the patient after a discussion of the risks, benefits, and alternatives to treatment. The LEFT flank region was prepped with sterile prep in a usual fashion, and a sterile drape was applied covering the operative field. A sterile gown and sterile gloves were used for the procedure. A timeout was performed prior to the initiation of the procedure. A pre procedural spot fluoroscopic image was obtained of the upper abdomen. Ultrasound scanning performed of the kidney was negative for significant hydronephrosis. As such, the stone within inferior pole was targeted using a combination of ultrasound and fluoroscopically with a 22 gauge Chiba needle. Access to the collecting system was confirmed with advancement of a Nitrex wire into the collecting system. The needle was exchanged for the inner 3 Fr catheter from an Accustick set and contrast injection confirmed access. An Accustick set was utilized to dilate the tract and was  subsequently exchanged for a Kumpe catheter over a Bentson wire. The Kumpe catheter was advanced down the ureter and into the urinary bladder. Next, over an Amplatz wire, the track was further dilated ultimately allowing placement of a 10 Fr, 26 cm percutaneous nephroureteral catheter with end coiled and locked within the urinary bladder. Contrast was injected and several spot fluoroscopic images were obtained in various obliquities. The catheter was secured at the skin entrance site with an interrupted suture and a stat lock device and connected to a gravity bag. Dressings were applied. The patient tolerated procedure well without immediate postprocedural complication. FINDINGS: Ultrasound scanning demonstrates a moderate dilated LEFT collecting system. Under a combination of ultrasound and fluoroscopic guidance, a posterior inferior calix was targeted, and access the urinary bladder obtained allowing placement of a 10 Fr, 26 cm percutaneous nephroureteral catheter with end coiled and locked within the urinary bladder. Contrast injection confirmed appropriate positioning. IMPRESSION: Successful placement of a LEFT 10 Fr, 26 cm nephroureteral drainage catheter, with tip at the level of the urinary bladder. RECOMMENDATIONS: The catheter was placed to gravity drainage, with anticipated capping trial after 24-48 hours. The patient will return to Vascular Interventional Radiology (VIR) for routine drainage catheter evaluation and exchange in 6-8 weeks. Art Largo, MD [facility] Radiology Electronically Signed   By: Art Largo M.D.   On: 11/10/2023 17:24    Labs:  CBC: Recent  Labs    11/08/23 0411 11/10/23 0359 11/11/23 0418 11/12/23 0358  WBC 5.5 5.4 5.8 5.1  HGB 9.3* 10.8* 11.8* 11.3*  HCT 28.1* 34.4* 37.0* 35.8*  PLT 134* 199 226 239    COAGS: Recent Labs    07/20/23 1310 07/20/23 1455 11/07/23 1514  INR 1.2  --  1.2  APTT  --  32  --      BMP: Recent Labs    11/08/23 0411 11/10/23 0359 11/11/23 0418 11/12/23 0358  NA 130* 133* 137 135  K 4.0 3.7 3.5 3.5  CL 104 102 105 104  CO2 21* 21* 25 25  GLUCOSE 152* 110* 100* 97  BUN 18 19 21* 23*  CALCIUM  8.0* 8.1* 8.5* 8.3*  CREATININE 1.29* 0.85 1.13 1.13  GFRNONAA >60 >60 >60 >60    LIVER FUNCTION TESTS: Recent Labs    07/21/23 1159 07/22/23 0610 07/23/23 0554 07/24/23 0614 09/11/23 1528  BILITOT 0.5 0.5 0.5 0.3 0.4  AST 19 16 28  182* 17  ALT 11 11 14  82* 13  ALKPHOS 40 39 39 50  --   PROT 7.7 7.2 7.9 7.7 8.8*  ALBUMIN  1.9* 1.9* 2.0* 2.1*  --     Assessment and Plan: Pt with hx HIV, CKD, chronic prostatic abscess, prior rt ureteral stenting, bilat hydronephrosis; s/p left NU cath 4/18; afebrile, no new labs; pt failed capping trial of NU cath yesterday; now back to gravity bag drainage ; pt will be scheduled for attempt at left JJ stent placement on 6/5 as OP- needs further treatment of prostatic abscess first ; above d/w Dr. Darylene Epley   Electronically Signed: D. Honore Lux, PA-C 11/14/2023, 3:31 PM   I spent a total of 15 Minutes at the the patient's bedside AND on the patient's hospital floor or unit, greater than 50% of which was counseling/coordinating care for left nephroureteral catheter     Patient ID: Jacob Gutierrez, male   DOB: March 17, 1986, 38 y.o.   MRN: [id number]

## 2023-11-14 NOTE — Progress Notes (Signed)
 [facility] Infectious Diseases Follow Up Note  Patient Identification: Patient Name: Jacob Gutierrez MRN: [id number] Admit Date: 11/07/2023  7:51 AM Age: 38 y.o.Today's Date: 11/14/2023  Reason for Visit: Chronic prostatitic abscess  Principal Problem:   Prostatic abscess  Antibiotics:  Ertapenem  postop till now   Lines/Hardware:  Interval Events: Remains afebrile Labs remarkable for NA 133, WBC 5.4, hemoglobin 10.8  Assessment 38 year old male with prior history as below including AIDS with poor compliance, Anal dysplasia, substance abuse, Cryptosporidium gastroenteritis, Shigella gastroenteritis, history of pulmonary coccidioidomycosis, monkeypox,  strep pyogenes bacteremia, CVA, syphilis status posttreatment, HTN, history of ESBL E. coli UTI/bacteremia with history of prostatic abscess being unroofed by Dr. Dulcy Gibney in 2019 as well drainage of perineal abscess by Dr. Ottelin in 11/19 who is directly admitted by urology Dr. Ranae Burrow for drainage of chronic prostatic abscess  Recently admitted with 12/26-1/16 sepsis secondary to complicated UTI.  Urine culture with ESBL E. coli and was treated with ertapenem  inpatient and discharged on omadacycline  for 2 weeks.  Workup was also found to have evidence of a prostate bladder communication and was recommended to follow-up with [facility] for urology evaluation.  # Chronic prostatic abscess  4/16 S/p cystoscopy with bilateral retrograde pyelogram, right ureteral stent insertion, attempted left ureteral stent insertion, transurethral unroofing of chronic prostatic abscess.  Complicated with posterior prostatic perforation.  No culture sent during OR. Path was suggestive of acute and chronic inflammation. He had urine cx from left nephrostomy tube (placed 4/18) that was no growth   # Bilateral hydronephrosis with AKI on CKD - s/p rt ureteral stent, creatinine down to 0.85 - 4/18 US  kidneys Moderate  volume LEFT hydronephrosis. RIGHT kidney decompressed with stent within the renal pelvis. Pelviectasis without hydronephrosis. - IR consulted for left nephrostomy   # Syphilis - s/p prior tx, RPR down to 1: 8     # AIDs  - still with poor compliance, CD4 <35, 2.3% Lab Results  Component Value Date   HIV1RNAQUANT 7,360 (H) 09/11/2023  - he'll continue his symtuza    Plan - Continue ertapenem  while inpatient and transition to Omadacycline  (2 weeks samples given 11/14/23) - 3-4 weeks treatment and will see how he does in clinic - ID clinic f/u 5/14 - f/u urology per their discretion - maintain contact isolation precaution - id will sign off - discussed with urology  ______________________________________________________________________ Subjective No complaint Doesn't seem to want to go with nephrostomy tube in place   Vitals BP 114/80 (BP Location: Right Arm)   Pulse 98   Temp (!) 97.5 F (36.4 C) (Oral)   Resp 18   Ht 6\' 1"  (1.854 m)   Wt 89.3 kg   SpO2 97%   BMI 25.97 kg/m     Physical Exam Constitutional: Adult male lying in the bed nontoxic appearing    Comments: HEENT WNL  Cardiovascular:     Rate and Rhythm: Normal rate and regular rhythm.     Heart sounds:   Pulmonary:     Effort: Pulmonary effort is normal.     Comments:   Abdominal:     Palpations: Abdomen is soft and nontender   GU:    Nephrostomy (left) tube remains in place; nephrostomy bag without urine; no cva tenderness      Musculoskeletal:        General: No swelling or tenderness in peripheral joints  Skin:    Comments: No rashes  Neurological:     General: Awake, alert and oriented, grossly nonfocal  Psychiatric:        Mood and Affect: Mood normal.    Pertinent Microbiology Results for orders placed or performed during the hospital encounter of 11/07/23  Aerobic/Anaerobic Culture w Gram Stain (surgical/deep wound)     Status: None (Preliminary result)   Collection Time:  11/10/23  4:26 PM   Specimen: Urine, Catheterized  Result Value Ref Range Status   Specimen Description   Final    URINE, CATHETERIZED LEFT NEPROSTOMY TUBE Performed at [facility], [address] [place], [place] [address]    Special Requests   Final    Immunocompromised Performed at [facility], [address], [place], [place] [address]    Gram Stain   Final    RARE WBC PRESENT, PREDOMINANTLY PMN NO ORGANISMS SEEN    Culture   Final    NO GROWTH 4 DAYS NO ANAEROBES ISOLATED; CULTURE IN PROGRESS FOR 5 DAYS Performed at [facility], [address] [place], [place] [address]    Report Status PENDING  Incomplete    Pertinent Lab.    Latest Ref Rng & Units 11/12/2023    3:58 AM 11/11/2023    4:18 AM 11/10/2023    3:59 AM  CBC  WBC 4.0 - 10.5 K/uL 5.1  5.8  5.4   Hemoglobin 13.0 - 17.0 g/dL 91.4  78.2  95.6   Hematocrit 39.0 - 52.0 % 35.8  37.0  34.4   Platelets 150 - 400 K/uL 239  226  199    ,    Latest Ref Rng & Units 11/12/2023    3:58 AM 11/11/2023    4:18 AM 11/10/2023    3:59 AM  CMP  Glucose 70 - 99 mg/dL 97  213  086   BUN 6 - 20 mg/dL 23  21  19    Creatinine 0.61 - 1.24 mg/dL 5.78  4.69  6.29   Sodium 135 - 145 mmol/L 135  137  133   Potassium 3.5 - 5.1 mmol/L 3.5  3.5  3.7   Chloride 98 - 111 mmol/L 104  105  102   CO2 22 - 32 mmol/L 25  25  21    Calcium  8.9 - 10.3 mg/dL 8.3  8.5  8.1     Pertinent Imaging today Plain films and CT images have been personally visualized and interpreted; radiology reports have been reviewed. Decision making incorporated into the Impression /   IR  NEPHROURETERAL CATH PLACE LEFT Result Date: 11/10/2023 INDICATION: Hydronephrosis. Briefly, 38 year old male with a history of ureteral obstruction s/p aborted retrograde LEFT ureteral stent. EXAM: Procedures; 1. LEFT ANTEGRADE NEPHROSTOGRAM 2. ULTRASOUND AND FLUOROSCOPIC GUIDED LEFT NEPHROURETERAL CATHETER PLACEMENT COMPARISON:  US  Renal,  11/07/2023. OR fluoroscopy, 11/07/2023. CT CAP, 10/22/2022. MEDICATIONS: The patient was on scheduled IV antibiotics. ANESTHESIA/SEDATION: Moderate (conscious) sedation was employed during this procedure. A total of Versed  4 mg and Fentanyl  100 mcg was administered intravenously. Moderate Sedation Time: 44 minutes. The patient's level of consciousness and vital signs were monitored continuously by radiology nursing throughout the procedure under my direct supervision. CONTRAST:  50 mL OMNIPAQUE  IOHEXOL  300 MG/ML SOLN, administered IV. 20 mL OMNIPAQUE  IOHEXOL  300 MG/ML SOLN, administered into the renal collecting system FLUOROSCOPY TIME:  Fluoroscopic dose; 18 mGy COMPLICATIONS: None immediate. PROCEDURE: Informed written consent was obtained from the patient after a discussion of the risks, benefits, and alternatives to treatment. The LEFT flank region was prepped with sterile prep in a usual fashion, and a sterile  drape was applied covering the operative field. A sterile gown and sterile gloves were used for the procedure. A timeout was performed prior to the initiation of the procedure. A pre procedural spot fluoroscopic image was obtained of the upper abdomen. Ultrasound scanning performed of the kidney was negative for significant hydronephrosis. As such, the stone within inferior pole was targeted using a combination of ultrasound and fluoroscopically with a 22 gauge Chiba needle. Access to the collecting system was confirmed with advancement of a Nitrex wire into the collecting system. The needle was exchanged for the inner 3 Fr catheter from an Accustick set and contrast injection confirmed access. An Accustick set was utilized to dilate the tract and was subsequently exchanged for a Kumpe catheter over a Bentson wire. The Kumpe catheter was advanced down the ureter and into the urinary bladder. Next, over an Amplatz wire, the track was further dilated ultimately allowing placement of a 10 Fr, 26 cm  percutaneous nephroureteral catheter with end coiled and locked within the urinary bladder. Contrast was injected and several spot fluoroscopic images were obtained in various obliquities. The catheter was secured at the skin entrance site with an interrupted suture and a stat lock device and connected to a gravity bag. Dressings were applied. The patient tolerated procedure well without immediate postprocedural complication. FINDINGS: Ultrasound scanning demonstrates a moderate dilated LEFT collecting system. Under a combination of ultrasound and fluoroscopic guidance, a posterior inferior calix was targeted, and access the urinary bladder obtained allowing placement of a 10 Fr, 26 cm percutaneous nephroureteral catheter with end coiled and locked within the urinary bladder. Contrast injection confirmed appropriate positioning. IMPRESSION: Successful placement of a LEFT 10 Fr, 26 cm nephroureteral drainage catheter, with tip at the level of the urinary bladder. RECOMMENDATIONS: The catheter was placed to gravity drainage, with anticipated capping trial after 24-48 hours. The patient will return to Vascular Interventional Radiology (VIR) for routine drainage catheter evaluation and exchange in 6-8 weeks. Art Largo, MD [facility] Radiology Electronically Signed   By: Art Largo M.D.   On: 11/10/2023 17:24   US  RENAL Result Date: 11/09/2023 CLINICAL DATA:  [id number] Ureteral obstruction, left [id number] EXAM: RENAL / URINARY TRACT ULTRASOUND COMPLETE COMPARISON:  US  RENAL, 07/20/2023. CT AP, 07/24/2023 and 12/08/2021. FINDINGS: Suboptimal evaluation, with poor acoustic penetration secondary to patient habitus. Right Kidney: Renal measurements: 13.4 x 6.7 x 6.8 cm (volume = 320 cm^3). Echogenicity within normal limits. No mass. Stent tip coiled at the RIGHT renal pelvis. Mild pelviectasis without overt hydronephrosis. Left Kidney: Renal measurements: 12.8 x 8.0 x 6.2 cm  (volume = 330 cm^3). Echogenicity within normal limits. No mass. Moderate volume of hydronephrosis visualized. Bladder: Decompressed urinary bladder with Foley catheter visualized Other: No free abdominopelvic fluid IMPRESSION: 1. Moderate volume LEFT hydronephrosis 2. RIGHT kidney decompressed with stent within the renal pelvis. Pelviectasis without hydronephrosis. Electronically Signed   By: Art Largo M.D.   On: 11/09/2023 07:05   DG C-Arm 1-60 Min-No Report Result Date: 11/07/2023 Fluoroscopy was utilized by the requesting physician.  No radiographic interpretation.     Electronically signed by:         Jamesetta Mcbride, MD [facility] [phone]  pager   [phone] cell 11/14/2023, 3:51 PM

## 2023-11-15 LAB — AEROBIC/ANAEROBIC CULTURE W GRAM STAIN (SURGICAL/DEEP WOUND)

## 2023-11-15 NOTE — Care Management Important Message (Signed)
 Important Message  Patient Details IM Letter given. Name: PHILIPE LASWELL MRN: [id number] Date of Birth: Jun 13, 1986   Important Message Given:  Yes - Medicare IM     Curtiss Dowdy 11/15/2023, 8:35 AM

## 2023-11-15 NOTE — TOC Transition Note (Signed)
 Transition of Care [facility]) - Discharge Note   Patient Details  Name: Jacob Gutierrez MRN: [id number] Date of Birth: November 06, 1985  Transition of Care [facility]) CM/SW Contact:  Gertha Ku, LCSW Phone Number: 11/15/2023, 1:50 PM   Clinical Narrative:     Pt requesting transportation assistance. Pt was provided with two bus passes. No further TOC needs, TOC sign off.    Patient Goals and CMS Choice Patient states their goals for this hospitalization and ongoing recovery are:: [facility]run to [facility]          Discharge Placement                       Discharge Plan and Services Additional resources added to the After Visit Summary for                                       Social Drivers of Health (SDOH) Interventions SDOH Screenings   Food Insecurity: No Food Insecurity (11/07/2023)  Housing: High Risk (11/07/2023)  Transportation Needs: No Transportation Needs (11/07/2023)  Utilities: Not At Risk (11/07/2023)  Depression (PHQ2-9): Low Risk  (09/11/2023)  Financial Resource Strain: Low Risk  (06/09/2018)  Physical Activity: Unknown (06/09/2018)  Social Connections: Patient Declined (11/07/2023)  Tobacco Use: High Risk (11/07/2023)     Readmission Risk Interventions    03/10/2021    2:47 PM  Readmission Risk Prevention Plan  Transportation Screening Complete  PCP or Specialist Appt within 3-5 Days Complete  HRI or Home Care Consult Complete  Social Work Consult for Recovery Care Planning/Counseling Complete  Palliative Care Screening Complete  Medication Review Oceanographer) Complete

## 2023-11-15 NOTE — Discharge Summary (Signed)
 Patient ID: Jacob Gutierrez MRN: [id number] DOB/AGE: May 15, 1986 38 y.o.  Admit date: 11/07/2023 Discharge date: 11/15/2023  Primary Care Physician:  Pcp, No  Discharge Diagnoses:   Present on Admission:  Prostatic abscess   Consults: infectious disease  -follow up scheduled 5/14. Dr. Shereen Dike   Discharge Medications: Allergies as of 11/15/2023       Reactions   Vancomycin  Anaphylaxis, Itching, Swelling, Other (See Comments)   Angioedema and "everything swells"   Amoxicillin Other (See Comments)   From childhood: "I had a reaction when i was little." (??) Has tolerated multiple cephalosporins in the past        Medication List     TAKE these medications    acetaminophen  500 MG tablet Commonly known as: TYLENOL  Take 1 tablet (500 mg total) by mouth every 6 (six) hours as needed.   dapsone  100 MG tablet Take 1 tablet (100 mg total) by mouth daily.   Nuzyra  150 MG Tabs Generic drug: Omadacycline  Tosylate Take 2 tablets (300 mg total) by mouth daily for 15 days.   omeprazole  40 MG capsule Commonly known as: PRILOSEC Take 40 mg by mouth daily.   sodium bicarbonate  650 MG tablet Take 1,300 mg by mouth 2 (two) times daily.   Symtuza  800-150-200-10 MG Tabs Generic drug: Darunavir -Cobicistat-Emtricitabine -Tenofovir  Alafenamide Take 1 tablet by mouth daily with breakfast.   tamsulosin  0.4 MG Caps capsule Commonly known as: FLOMAX  TAKE 1 CAPSULE(0.4 MG) BY MOUTH DAILY AFTER SUPPER What changed: See the new instructions.         Significant Diagnostic Studies:  US  RENAL Result Date: 11/09/2023 CLINICAL DATA:  [id number] Ureteral obstruction, left [id number] EXAM: RENAL / URINARY TRACT ULTRASOUND COMPLETE COMPARISON:  US  RENAL, 07/20/2023. CT AP, 07/24/2023 and 12/08/2021. FINDINGS: Suboptimal evaluation, with poor acoustic penetration secondary to patient habitus. Right Kidney: Renal measurements: 13.4 x 6.7 x 6.8 cm (volume = 320 cm^3). Echogenicity within normal limits. No  mass. Stent tip coiled at the RIGHT renal pelvis. Mild pelviectasis without overt hydronephrosis. Left Kidney: Renal measurements: 12.8 x 8.0 x 6.2 cm (volume = 330 cm^3). Echogenicity within normal limits. No mass. Moderate volume of hydronephrosis visualized. Bladder: Decompressed urinary bladder with Foley catheter visualized Other: No free abdominopelvic fluid IMPRESSION: 1. Moderate volume LEFT hydronephrosis 2. RIGHT kidney decompressed with stent within the renal pelvis. Pelviectasis without hydronephrosis. Electronically Signed   By: Art Largo M.D.   On: 11/09/2023 07:05   DG C-Arm 1-60 Min-No Report Result Date: 11/07/2023 Fluoroscopy was utilized by the requesting physician.  No radiographic interpretation.    Brief H and P: For complete details please refer to admission H&P, but in brief,  Patient is a 38 year old male known to our practice.  Previously seen for large prostatic fluid collection/abscess extending into the bladder.  Due to the complicated nature of his case he was referred to Dr. Rose Conception of [facility] who examined his information and felt that his prostate abscess could be removed conventionally, directing him back to our practice.  He is HIV positive and has been noncompliant with previous PrEP for some time.  This is somewhat better though appears to be an ongoing issue.  He was taken back to the operating room by Dr. Inga Manges on 11/07/2023 to address his prostatic abscess and associated bilateral hydronephrosis.  The prostate was able to be unroofed transurethrally.  The side was sucessfully stented, but the left could not be safely accessed. Pt ultimately received a percutaneous nephrostomy tube on this side.  He  underwent a capping trial while in the hospital but reported severe pain when urinating and asked that it be placed back to gravity drainage.  He has follow-up with interventional radiology where they will manage tube exchanges and consideration for antegrade stent  placement in the interim.  Infectious disease is following him and provided him with 2 weeks of samples of his antibiotic as well as scheduled follow-up.  He will be seen in our clinic on 4/29 for follow-up and voiding trial.  On the morning of his discharge was sleeping comfortably but easily awoken.  Case and plan was reviewed and questions were answered to his satisfaction.  He has met all metrics for discharge and is safe to return home.   Hospital Course:  Principal Problem:   Prostatic abscess   Day of Discharge BP 100/62 (BP Location: Right Arm)   Pulse 70   Temp 97.9 F (36.6 C) (Oral)   Resp 15   Ht 6\' 1"  (1.854 m)   Wt 89.3 kg   SpO2 93%   BMI 25.97 kg/m   No results found for this or any previous visit (from the past 24 hours).  Physical Exam: General: Alert and awake oriented x3 not in any acute distress. HEENT: anicteric sclera, pupils reactive to light and accommodation CVS: no cyanosis Chest: equal chest expansion Abdomen: soft nontender, nondistended, normal bowel sounds, no organomegaly Extremities: no cyanosis, clubbing or edema noted bilaterally  Disposition:  home   Diet:  regular  Activity:  as tolerated  DISCHARGE FOLLOW-UP   Follow-up Information     Aquilla Knapp, NP Follow up on 11/21/2023.   Specialty: Nurse Practitioner Why: [phone] Contact information: [address] [place] [address] [phone]                 Time spent on Discharge:   A total of 41 minutes was spent in planning, education, and discharge  Signed: Kyra Phy Kai Railsback 11/15/2023, 8:05 AM

## 2023-11-24 LAB — HIV GENOSURE(R) MG

## 2023-11-24 LAB — REFLEX TO GENOSURE(R) MG EDI

## 2023-11-24 LAB — HIV-1 RNA, PCR (GRAPH) RFX/GENO EDI
HIV-1 RNA BY PCR: 72400 {copies}/mL
HIV-1 RNA Quant, Log: 4.86 {Log_copies}/mL

## 2023-11-27 ENCOUNTER — Other Ambulatory Visit: Payer: Self-pay

## 2023-11-27 ENCOUNTER — Encounter

## 2023-11-30 ENCOUNTER — Other Ambulatory Visit: Payer: Self-pay

## 2023-11-30 ENCOUNTER — Ambulatory Visit (INDEPENDENT_AMBULATORY_CARE_PROVIDER_SITE_OTHER): Admitting: Infectious Diseases

## 2023-11-30 ENCOUNTER — Encounter: Payer: Self-pay | Admitting: Infectious Diseases

## 2023-11-30 VITALS — BP 95/58 | HR 86 | Temp 97.6°F | Wt 204.0 lb

## 2023-11-30 DIAGNOSIS — Z113 Encounter for screening for infections with a predominantly sexual mode of transmission: Secondary | ICD-10-CM

## 2023-11-30 DIAGNOSIS — B20 Human immunodeficiency virus [HIV] disease: Secondary | ICD-10-CM | POA: Insufficient documentation

## 2023-11-30 DIAGNOSIS — Z79899 Other long term (current) drug therapy: Secondary | ICD-10-CM | POA: Diagnosis not present

## 2023-11-30 DIAGNOSIS — Z5900 Homelessness unspecified: Secondary | ICD-10-CM

## 2023-11-30 DIAGNOSIS — Z0000 Encounter for general adult medical examination without abnormal findings: Secondary | ICD-10-CM | POA: Diagnosis not present

## 2023-11-30 MED ORDER — SYMTUZA 800-150-200-10 MG PO TABS: 1.0000 | ORAL_TABLET | Freq: Every day | ORAL | 5 refills | Status: DC

## 2023-11-30 NOTE — Progress Notes (Addendum)
 [address], [place], [place], [address]                                                                  Phn. [phone]; Fax: [phone]                                                                             Date: 11/30/23  Reason for Visit: Routine HIV care/Chronic prostatic abscess   HPI: 38 year old male with prior history as below including AIDS with poor compliance, Anal dysplasia, substance abuse, Cryptosporidium gastroenteritis, Shigella gastroenteritis, history of pulmonary coccidioidomycosis, monkeypox,  strep pyogenes bacteremia, CVA, syphilis status posttreatment, HTN, history of ESBL E. coli UTI/bacteremia with history of prostatic abscess being unroofed by Dr. Dulcy Gibney in 2019 as well drainage of perineal abscess by Dr. Ottelin in 11/19  who is here for HFU after recent admission 4/15-4/23 for chronic prostatic abscess.  On 4/16 underwent cystoscopy with bilateral retrograde pyelogram, right ureteral stent insertion, attempted left ureteral stent insertion, transurethral unroofing of chronic prostatic abscess.  Complicated with posterior prostatic perforation.  No culture sent during OR. Path was suggestive of acute and chronic inflammation. Underwent Left sided nephrostomy by IR on 4/18. Failed capping trial in the hospital and plan for OP fu with IR and Urology for possible tube exchanges and possible antegrade stent placement as well as voiding trial respectively. Initially on IV ertapenem  but transitioned over to Omadacycline  for 2 weeks on 4/22 during discharge.   5/8  He reports taking omadacycline  with two doses remaining in a two-week course, having missed one dose. He continues Symtuza ,dapsone  for HIV but occasionally misses doses. The nephrostomy remains in place and is  draining, though it was recently snagged. No urinary symptoms are present. He has a fu next Tuesday with Urology. Denies fevers, chills. Denies nausea, vomiting. He reports he does not have a stable place to stay. No other complaints.   ROS: As stated in above HPI; all other systems were reviewed and are otherwise negative unless noted below  No reported fever / chills, night sweats, unintentional weight loss, acute visual change, odynophagia, chest pain/pressure, new or worsened SOB or WOB, nausea, vomiting, diarrhea, dysuria, GU discharge, syncope, seizures, red/hot swollen joints, hallucinations / delusions, rashes, new allergies, unusual / excessive bleeding, swollen lymph nodes, or new hospitalizations/ED visits/Urgent Care visits since the pt was last seen.  PMH/ PSH/ FamHx / Social Hx , medications and allergies reviewed and updated as appropriate; please see corresponding tab in EHR / prior notes  Current Outpatient Medications on File Prior to Visit  Medication Sig Dispense Refill   acetaminophen  (TYLENOL ) 500 MG tablet Take 1 tablet (500 mg total) by mouth every 6 (six) hours as needed. 30 tablet 0   dapsone  100 MG tablet Take 1 tablet (100 mg total) by mouth daily. 30 tablet 3   Omadacycline  Tosylate (NUZYRA ) 150 MG TABS Take by mouth.     omeprazole  (PRILOSEC) 40 MG capsule Take 40 mg by mouth daily.     tamsulosin  (FLOMAX ) 0.4 MG CAPS capsule TAKE 1 CAPSULE(0.4 MG) BY MOUTH DAILY AFTER SUPPER (Patient taking differently: Take 0.4 mg by mouth in the morning.) 30 capsule 0   sodium bicarbonate  650 MG tablet Take 1,300 mg by mouth 2 (two) times daily. (Patient not taking: Reported on 11/30/2023)     No current facility-administered medications on file prior to visit.   Allergies  Allergen Reactions   Vancomycin  Anaphylaxis, Itching, Swelling and Other (See Comments)    Angioedema and "everything swells"   Amoxicillin Other (See Comments)    From  childhood: "I had a reaction when i was little." (??) Has tolerated multiple cephalosporins in the past   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)    GERD (gastroesophageal reflux disease)    H/O coccidioidomycosis    pulmonary    HIV disease (HCC)    Human monkeypox    Past history of allergy to penicillin -type antibiotic 07/03/2012   desensitization   Shigella gastroenteritis    Stroke [facility])    Syphilis    history /treated    Syphilis, unspecified 09/12/2016   Past Surgical History:  Procedure Laterality Date   BIOPSY  10/16/2020   Procedure: BIOPSY;  Surgeon: Normie Becton., MD;  Location: [facility] ENDOSCOPY;  Service: Gastroenterology;;   BIOPSY  10/23/2020   Procedure: BIOPSY;  Surgeon: Lajuan Pila, MD;  Location: [facility] ENDOSCOPY;  Service: Endoscopy;;   COLONOSCOPY WITH PROPOFOL  N/A 10/23/2020   Procedure: COLONOSCOPY WITH PROPOFOL ;  Surgeon: Lajuan Pila, MD;  Location: [facility] ENDOSCOPY;  Service: Endoscopy;  Laterality: N/A;   ESOPHAGOGASTRODUODENOSCOPY (EGD) WITH PROPOFOL  N/A 10/16/2020   Procedure: ESOPHAGOGASTRODUODENOSCOPY (EGD) WITH PROPOFOL ;  Surgeon: Brice Campi Albino Alu., MD;  Location: [facility] ENDOSCOPY;  Service: Gastroenterology;  Laterality: N/A;   IR  NEPHROURETERAL CATH PLACE LEFT  11/10/2023   TRANSURETHRAL RESECTION OF PROSTATE N/A 12/09/2017   Procedure: TRANSURETHRAL RESECTION OF THE PROSTATE (TURP);  Surgeon: Andrez Banker, MD;  Location: [facility] ORS;  Service: Urology;  Laterality: N/A;   TRANSURETHRAL RESECTION OF PROSTATE N/A 11/07/2023   Procedure: TURP (TRANSURETHRAL RESECTION OF PROSTATE);  Surgeon: Homero Luster, MD;  Location: [facility] ORS;  Service: Urology;  Laterality: N/A;  TRANSURETHRAL RESECTION OF  PROSTATIC ABSCESS WITHCYSTO AND  BILATERAL RETROGRADES   Social History   Socioeconomic History   Marital status: Single    Spouse name: Not on file   Number of children: Not on file   Years of education: Not on file   Highest education level: Not on file  Occupational History   Not on file  Tobacco Use   Smoking status: Every Day    Current packs/day: 0.30    Types: Cigarettes, E-cigarettes   Smokeless tobacco: Never   Tobacco comments:  States he is trying to cut back, goes through a pack of cigarettes in about a week  Vaping Use   Vaping status: Some Days   Substances: Flavoring  Substance and Sexual Activity   Alcohol use: Yes    Alcohol/week: 1.0 standard drink of alcohol    Types: 1 Standard drinks or equivalent per week    Comment: whiskey occasionally    Drug use: Yes    Frequency: 1.0 times per week    Types: Marijuana    Comment: socially   Sexual activity: Yes    Partners: Female, Male    Birth control/protection: Condom    Comment: condoms given  Other Topics Concern   Not on file  Social History Narrative   Not on file   Social Drivers of Health   Financial Resource Strain: Low Risk  (06/09/2018)   Overall Financial Resource Strain (CARDIA)    Difficulty of Paying Living Expenses: Not very hard  Food Insecurity: No Food Insecurity (11/07/2023)   Hunger Vital Sign    Worried About Running Out of Food in the Last Year: Never true    Ran Out of Food in the Last Year: Never true  Transportation Needs: No Transportation Needs (11/07/2023)   PRAPARE - Administrator, Civil Service (Medical): No    Lack of Transportation (Non-Medical): No  Physical Activity: Unknown (06/09/2018)   Exercise Vital Sign    Days of Exercise per Week: Patient declined    Minutes of Exercise per Session: Patient declined  Stress: Not on file  Social Connections: Patient Declined (11/07/2023)   Social Connection and Isolation Panel [NHANES]    Frequency of  Communication with Friends and Family: Patient declined    Frequency of Social Gatherings with Friends and Family: Patient declined    Attends Religious Services: Patient declined    Database administrator or Organizations: Patient declined    Attends Banker Meetings: Patient declined    Marital Status: Patient declined  Intimate Partner Violence: Patient Declined (11/07/2023)   Humiliation, Afraid, Rape, and Kick questionnaire    Fear of Current or Ex-Partner: Patient declined    Emotionally Abused: Patient declined    Physically Abused: Patient declined    Sexually Abused: Patient declined   Family History  Problem Relation Age of Onset   Hypertension Mother    Lupus Maternal Grandmother    Diabetes Maternal Grandmother    Cancer Paternal Grandfather        unknown    Prostate cancer Father    Diabetes Father    Diabetes Maternal Grandfather    Colon cancer Neg Hx    Stomach cancer Neg Hx    Esophageal cancer Neg Hx    Pancreatic cancer Neg Hx    Vitals  BP (!) 95/58   Pulse 86   Temp 97.6 F (36.4 C) (Oral)   Wt 204 lb (92.5 kg)   SpO2 99%   BMI 26.91 kg/m    Examination  Gen: no acute distress HEENT: [place]/AT, no scleral icterus, no pale conjunctivae, hearing normal, oral mucosa moist Neck: Supple Cardio: Regular rate and rhythm Resp: Pulmonary effort normal in room air GI: nondistended GU: Musc: Extremities: No pedal edema Skin: No rashes Neuro: grossly non focal , awake, alert and oriented * 3  Psych: Calm, cooperative  Lab Results HIV 1 RNA Quant  Date Value  09/11/2023 7,360 Copies/mL (H)  07/20/2023 1,180,000 copies/mL  10/18/2022 447,000 copies/mL (H)   CD4 T  Cell Abs (/uL)  Date Value  09/11/2023 <35 (L)  07/20/2023 <35 (L)  10/18/2022 <35 (L)   Lab Results  Component Value Date   HIV1GENOSEQ  RT, Seq^REPORT 12/17/2013   Lab Results  Component Value Date   WBC 5.1 11/12/2023   HGB 11.3 (L) 11/12/2023   HCT 35.8 (L)  11/12/2023   MCV 101.1 (H) 11/12/2023   PLT 239 11/12/2023    Lab Results  Component Value Date   CREATININE 1.13 11/12/2023   BUN 23 (H) 11/12/2023   NA 135 11/12/2023   K 3.5 11/12/2023   CL 104 11/12/2023   CO2 25 11/12/2023   Lab Results  Component Value Date   ALT 13 09/11/2023   AST 17 09/11/2023   ALKPHOS 50 07/24/2023   BILITOT 0.4 09/11/2023    Lab Results  Component Value Date   CHOL 134 06/11/2019   TRIG 49 06/11/2019   HDL 45 06/11/2019   LDLCALC 75 06/11/2019   Lab Results  Component Value Date   HAV NON-REACTIVE 10/24/2018   Lab Results  Component Value Date   HEPBSAG NON REACTIVE 03/04/2021   HEPBSAB NON-REACTIVE 04/08/2021   Lab Results  Component Value Date   HCVAB NON REACTIVE 03/04/2021   Lab Results  Component Value Date   CHLAMYDIAWP Negative 11/08/2023   N Negative 11/08/2023   No results found for: "GCPROBEAPT" No results found for: "QUANTGOLD"    Health Maintenance: Immunization History  Administered Date(s) Administered   Hepatitis A, Adult 02/25/2014   Influenza Split 04/26/2013   Influenza,inj,Quad PF,6+ Mos 04/24/2018, 06/11/2019, 07/27/2021   Influenza-Unspecified 04/17/2017   Janssen (J&J) SARS-COV-2 Vaccination 11/02/2019   Meningococcal Mcv4o 02/02/2018, 01/20/2022   PPD Test 05/23/2013, 12/17/2013, 09/08/2016   Pneumococcal Conjugate-13 02/02/2018   Pneumococcal Polysaccharide-23 12/17/2013, 04/24/2018   Tdap 08/10/1994, 01/20/2022    Assessment/Plan: # AIDs - continue symtuza  and dapsone , encouraged compliance. Meds refilled   - labs today  - Fu in 6 weeks for VL and CD4  # Chronic prostatic abscess  - s/p urologic and IR intervention as above as well as antibiotics - Fu with IR and Urology as planned   # HTN - BP is controlled   # STD Screening  - no acute concerns  - Urine, oral and anal GC and RPR  #Immunization  - Deferred today   #Health maintenance - Lipid panel  - discuss anal pap next  visit  - Referral to [facility]   Patient's labs were reviewed as well as his previous records. Patients questions were addressed and answered. Safe sex counseling done.  I have personally spent 36 minutes involved in face-to-face and non-face-to-face activities for this patient on the day of the visit. Professional time spent includes the following activities: Preparing to see the patient (review of tests), Obtaining and/or reviewing separately obtained history (admission/discharge record), Performing a medically appropriate examination and/or evaluation , Ordering medications/tests/procedures, referring and communicating with other health care professionals, Documenting clinical information in the EMR, Independently interpreting results (not separately reported), Communicating results to the patient/family/caregiver, Counseling and educating the patient/family/caregiver and Care coordination (not separately reported).   Of note, portions of this note may have been created with voice recognition software. While this note has been edited for accuracy, occasional wrong-word or 'sound-a-like' substitutions may have occurred due to the inherent limitations of voice recognition software.   Electronically signed by:  Terre Ferri, MD Infectious Disease Physician [facility] [address]  [address] [place], [place] [address] Phone: [phone]  Fax: [phone]

## 2023-11-30 NOTE — Patient Instructions (Signed)
 Smoking Cessation: QuitlineNC 1-800-QUIT-NOW 707-701-6721); Espaol: 1-855-Djelo-Ya (1-780-445-4976) [email or web address]

## 2023-12-01 ENCOUNTER — Telehealth: Payer: Self-pay | Admitting: Pharmacist

## 2023-12-01 DIAGNOSIS — Z5900 Homelessness unspecified: Secondary | ICD-10-CM | POA: Insufficient documentation

## 2023-12-01 DIAGNOSIS — Z113 Encounter for screening for infections with a predominantly sexual mode of transmission: Secondary | ICD-10-CM | POA: Insufficient documentation

## 2023-12-01 LAB — GC/CHLAMYDIA PROBE, AMP (THROAT)
Chlamydia trachomatis RNA: NOT DETECTED
Neisseria gonorrhoeae RNA: NOT DETECTED

## 2023-12-01 LAB — CT/NG RNA, TMA RECTAL
Chlamydia Trachomatis RNA: NOT DETECTED
Neisseria Gonorrhoeae RNA: NOT DETECTED

## 2023-12-01 MED ORDER — DAPSONE 100 MG PO TABS: 100.0000 mg | ORAL_TABLET | Freq: Every day | ORAL | 5 refills | Status: DC

## 2023-12-01 NOTE — Telephone Encounter (Signed)
 Cumulative HIV Genotype Data  Genotype Dates: Genosure 4/25 - genotypes 3/24, 6/23, 11/22, 9/22, 11/20, 7/19, 5/15  RT Mutations M184MV, K103N, G190AT, V35VI, V90VI, K122KE, I135T, I142T, V179VI, R211K, V245M, D250E, I293IV, E297K, D324E  PI Mutations I13V, I15V, I62V, L63AS, V77I  Integrase Mutations  E157Q, M50I   Interpretation of Genotype Data per [employer] HIV Drug Resistance Database:  Nucleoside RTIs  Abacavir - Low-level resistance Zidovudine - Susceptible Emtricitabine  - High-level resistance Lamivudine - High-level resistance Tenofovir  - Susceptible   Non-Nucleoside RTIs  Doravirine -  Potential low-level resistance Efavirenz - High-level resistance Etravirine -  Potential low-level resistance Nevirapine - High-level resistance Rilpivirine - Low-level resistance   Protease Inhibitors  Atazanavir - Susceptible Darunavir  - Susceptible Lopinavir - Susceptible   Integrase Inhibitors  Bictegravir - Susceptible Cabotegravir - Susceptible Dolutegravir  - Susceptible Elvitegravir - Potential low-level resistance Raltegravir - Potential low-level resistance   Comments: M184V/I cause high-level in vitro resistance to 3TC and FTC and low/intermediate resistance to ABC (3-fold reduced susceptibility). M184V/I are not contraindications to continued treatment with 3TC or FTC because they increase susceptibility to AZT and TDF and are associated with clinically significant reductions in HIV-1 replication.  Ok to continue Symtuza  alone at this time.   Nicklas Barns, PharmD, CPP, BCIDP, AAHIVP Clinical Pharmacist Practitioner Infectious Diseases Clinical Pharmacist [facility]

## 2023-12-03 LAB — LIPID PANEL
Cholesterol: 135 mg/dL
HDL: 46 mg/dL
LDL Cholesterol (Calc): 75 mg/dL
Non-HDL Cholesterol (Calc): 89 mg/dL
Total CHOL/HDL Ratio: 2.9 (calc)
Triglycerides: 67 mg/dL

## 2023-12-03 LAB — T PALLIDUM AB: T Pallidum Abs: POSITIVE — AB

## 2023-12-03 LAB — T-HELPER CELLS (CD4) COUNT (NOT AT ARMC)
Absolute CD4: 94 {cells}/uL — ABNORMAL LOW (ref 490–1740)
CD4 T Helper %: 5 % — ABNORMAL LOW (ref 30–61)
Total lymphocyte count: 1782 {cells}/uL (ref 850–3900)

## 2023-12-03 LAB — HIV RNA, RTPCR W/R GT (RTI, PI,INT)
HIV 1 RNA Quant: 351 {copies}/mL — ABNORMAL HIGH
HIV-1 RNA Quant, Log: 2.55 {Log_copies}/mL — ABNORMAL HIGH

## 2023-12-03 LAB — SYPHILIS: RPR W/REFLEX TO RPR TITER AND TREPONEMAL ANTIBODIES, TRADITIONAL SCREENING AND DIAGNOSIS ALGORITHM: RPR Ser Ql: REACTIVE — AB

## 2023-12-03 LAB — RPR TITER: RPR Titer: 1:8 {titer} — ABNORMAL HIGH

## 2023-12-06 ENCOUNTER — Ambulatory Visit: Admitting: Family

## 2023-12-06 ENCOUNTER — Telehealth: Payer: Self-pay

## 2023-12-06 DIAGNOSIS — B20 Human immunodeficiency virus [HIV] disease: Secondary | ICD-10-CM

## 2023-12-06 NOTE — Telephone Encounter (Signed)
 Detectable Viral Load Intervention (DVL)  Most recent VL:  HIV 1 RNA Quant  Date Value Ref Range Status  11/30/2023 351 (H) copies/mL Final  09/11/2023 7,360 (H) Copies/mL Final  07/20/2023 1,180,000 copies/mL Corrected    Comment:    (NOTE) The reportable range for this assay is 20 to 10,000,000 copies HIV-1 RNA/mL.     Last Clinic Visit: 11/30/23  Current ART regimen: Symtuza   Appointment status: patient has future appointment scheduled  Medication last dispensed (per chart review):   Dispensed Days Supply Quantity Provider Pharmacy   SYMTUZA   800-150-200-10 MG TABS 11/30/2023 30 30 tablet Terre Ferri, MD [facility] #...  SYMTUZA  TABLETS 09/11/2023 30 30 each Calone, Gregory D, FNP [facility]...  Darunavir -Cobicistat-Emtricitabine -Tenofovir  Alafenamide (SYMTUZA ) 800-150-200-10 MG TABS 08/10/2023 30 30 tablet Verlyn Goad, MD [place] [facility]...  SYMTUZA  TABLETS 10/18/2022        Medication Adherence   What pharmacy do you use for your ART? [facility]   Do you pick up your medication at the pharmacy or is it mailed to you? pick up at pharmacy  How often do you miss a dose your ART? a few times a month  Are you experiencing any side effects with your ART? No   Are you having any trouble remembering what medication(s) you are supposed to take or how you are supposed to take them? No   What helps you remember to take your medication(s)? No    Barriers to Care   Lack of transportation to medical appointments? Yes--  city bus   2. Housing instability? Yes- staying with a friend   3. If you are currently employed, are you having difficulty taking time off of work for medical appointments? No   4. Financial concerns (rent, utilities, etc.) no   5. Lack of consistent access to food? No   6. Trouble remembering and attending your appointments? Yes   7. Are you experiencing any other barriers that make it hard for you to come to  appointments or take medication regularly? No    Interventions   Called patient to discuss medication adherence and possible barriers to care. Spoke with pt. Is currently working on getting back with [facility] for case management. Interested in research study; has appt 5/15. Julien Odor, RMA

## 2023-12-07 ENCOUNTER — Encounter

## 2023-12-07 ENCOUNTER — Ambulatory Visit: Payer: Self-pay | Admitting: Infectious Diseases

## 2023-12-22 NOTE — Progress Notes (Signed)
 The ASCVD Risk score (Arnett DK, et al., 2019) failed to calculate for the following reasons:   Risk score cannot be calculated because patient has a medical history suggesting prior/existing ASCVD  Jacob Gutierrez, BSN, RN

## 2023-12-27 ENCOUNTER — Other Ambulatory Visit: Payer: Self-pay | Admitting: Radiology

## 2023-12-27 DIAGNOSIS — N1330 Unspecified hydronephrosis: Secondary | ICD-10-CM

## 2023-12-27 DIAGNOSIS — N412 Abscess of prostate: Secondary | ICD-10-CM

## 2023-12-27 NOTE — H&P (Signed)
 Chief Complaint:  HIV, CKD, chronic prostatic abscess, prior rt ureteral stenting, bilat hydronephrosis; s/p left NU cath 4/18; referred for attempt at left JJ /ureteral stent placement   Referring Provider(s): Venetta Gill  Supervising Physician: Art Largo  Patient Status: [facility] - Out-pt  History of Present Illness: Jacob Gutierrez is a 38 y.o. male with hx HIV, GERD, CVA, CKD, chronic prostatic abscess, prior rt ureteral stenting, bilat hydronephrosis; s/p left NU cath 4/18; he is scheduled today for left nephrostogram with possible conversion of NU cath to JJ stent.   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)    GERD (gastroesophageal reflux disease)    H/O coccidioidomycosis    pulmonary    HIV disease (HCC)    Human monkeypox    Past history of allergy to penicillin -type antibiotic 07/03/2012   desensitization   Shigella gastroenteritis    Stroke [facility])    Syphilis    history /treated    Syphilis, unspecified 09/12/2016    Past Surgical History:  Procedure Laterality Date   BIOPSY  10/16/2020   Procedure: BIOPSY;  Surgeon: Normie Becton., MD;  Location: [facility] ENDOSCOPY;  Service: Gastroenterology;;   BIOPSY  10/23/2020   Procedure: BIOPSY;  Surgeon: Lajuan Pila, MD;  Location: [facility] ENDOSCOPY;  Service: Endoscopy;;   COLONOSCOPY WITH PROPOFOL  N/A 10/23/2020   Procedure: COLONOSCOPY WITH PROPOFOL ;  Surgeon: Lajuan Pila, MD;  Location: [facility] ENDOSCOPY;  Service: Endoscopy;  Laterality: N/A;   ESOPHAGOGASTRODUODENOSCOPY (EGD) WITH PROPOFOL  N/A 10/16/2020   Procedure: ESOPHAGOGASTRODUODENOSCOPY (EGD) WITH PROPOFOL ;  Surgeon: Brice Campi Albino Alu., MD;  Location: [facility] ENDOSCOPY;  Service:  Gastroenterology;  Laterality: N/A;   IR  NEPHROURETERAL CATH PLACE LEFT  11/10/2023   TRANSURETHRAL RESECTION OF PROSTATE N/A 12/09/2017   Procedure: TRANSURETHRAL RESECTION OF THE PROSTATE (TURP);  Surgeon: Andrez Banker, MD;  Location: [facility] ORS;  Service: Urology;  Laterality: N/A;   TRANSURETHRAL RESECTION OF PROSTATE N/A 11/07/2023   Procedure: TURP (TRANSURETHRAL RESECTION OF PROSTATE);  Surgeon: Homero Luster, MD;  Location: [facility] ORS;  Service: Urology;  Laterality: N/A;  TRANSURETHRAL RESECTION OF PROSTATIC ABSCESS WITHCYSTO AND  BILATERAL RETROGRADES    Allergies: Vancomycin  and Amoxicillin  Medications: Prior to Admission medications   Medication Sig Start Date End Date Taking? Authorizing Provider  acetaminophen  (TYLENOL ) 500 MG tablet Take 1 tablet (500 mg total) by mouth every 6 (six) hours as needed. 10/27/22   Nanavati, Ankit, MD  dapsone  100 MG tablet Take 1 tablet (100 mg total) by mouth daily. 01/09/24   Manandhar, Sabina, MD  Darunavir -Cobicistat-Emtricitabine -Tenofovir  Alafenamide (SYMTUZA ) 800-150-200-10 MG TABS Take 1 tablet by mouth daily with breakfast. 01/09/24   Manandhar, Sabina, MD  Omadacycline  Tosylate (NUZYRA ) 150 MG TABS Take by mouth.    [provider]  omeprazole  (PRILOSEC) 40 MG capsule Take 40 mg by mouth daily.    [provider]  sodium bicarbonate  650 MG tablet Take 1,300 mg by mouth 2 (two) times daily. Patient not taking: Reported on 11/30/2023    [provider]  tamsulosin  (FLOMAX ) 0.4 MG CAPS capsule TAKE 1 CAPSULE(0.4 MG) BY MOUTH DAILY AFTER SUPPER Patient taking differently: Take 0.4 mg by mouth in the morning. 10/09/23   Calone, Gregory D,  FNP     Family History  Problem Relation Age of Onset   Hypertension Mother    Lupus Maternal Grandmother    Diabetes Maternal Grandmother    Cancer Paternal Grandfather        unknown    Prostate cancer Father    Diabetes Father    Diabetes Maternal Grandfather    Colon cancer Neg  Hx    Stomach cancer Neg Hx    Esophageal cancer Neg Hx    Pancreatic cancer Neg Hx     Social History   Socioeconomic History   Marital status: Single    Spouse name: Not on file   Number of children: Not on file   Years of education: Not on file   Highest education level: Not on file  Occupational History   Not on file  Tobacco Use   Smoking status: Every Day    Current packs/day: 0.30    Types: Cigarettes, E-cigarettes   Smokeless tobacco: Never   Tobacco comments:    States he is trying to cut back, goes through a pack of cigarettes in about a week  Vaping Use   Vaping status: Some Days   Substances: Flavoring  Substance and Sexual Activity   Alcohol use: Yes    Alcohol/week: 1.0 standard drink of alcohol    Types: 1 Standard drinks or equivalent per week    Comment: whiskey occasionally    Drug use: Yes    Frequency: 1.0 times per week    Types: Marijuana    Comment: socially   Sexual activity: Yes    Partners: Female, Male    Birth control/protection: Condom    Comment: condoms given  Other Topics Concern   Not on file  Social History Narrative   Not on file   Social Drivers of Health   Financial Resource Strain: Low Risk  (06/09/2018)   Overall Financial Resource Strain (CARDIA)    Difficulty of Paying Living Expenses: Not very hard  Food Insecurity: No Food Insecurity (11/07/2023)   Hunger Vital Sign    Worried About Running Out of Food in the Last Year: Never true    Ran Out of Food in the Last Year: Never true  Transportation Needs: No Transportation Needs (11/07/2023)   PRAPARE - Administrator, Civil Service (Medical): No    Lack of Transportation (Non-Medical): No  Physical Activity: Unknown (06/09/2018)   Exercise Vital Sign    Days of Exercise per Week: Patient declined    Minutes of Exercise per Session: Patient declined  Stress: Not on file  Social Connections: Patient Declined (11/07/2023)   Social Connection and Isolation Panel  [NHANES]    Frequency of Communication with Friends and Family: Patient declined    Frequency of Social Gatherings with Friends and Family: Patient declined    Attends Religious Services: Patient declined    Database administrator or Organizations: Patient declined    Attends Banker Meetings: Patient declined    Marital Status: Patient declined       Review of Systems  Vital Signs:   Advance Care Plan: no documents on file  Physical Exam  Imaging: No results found.  Labs:  CBC: Recent Labs    11/08/23 0411 11/10/23 0359 11/11/23 0418 11/12/23 0358  WBC 5.5 5.4 5.8 5.1  HGB 9.3* 10.8* 11.8* 11.3*  HCT 28.1* 34.4* 37.0* 35.8*  PLT 134* 199 226 239    COAGS: Recent Labs    07/20/23  1310 07/20/23 1455 11/07/23 1514  INR 1.2  --  1.2  APTT  --  32  --     BMP: Recent Labs    11/08/23 0411 11/10/23 0359 11/11/23 0418 11/12/23 0358  NA 130* 133* 137 135  K 4.0 3.7 3.5 3.5  CL 104 102 105 104  CO2 21* 21* 25 25  GLUCOSE 152* 110* 100* 97  BUN 18 19 21* 23*  CALCIUM  8.0* 8.1* 8.5* 8.3*  CREATININE 1.29* 0.85 1.13 1.13  GFRNONAA >60 >60 >60 >60    LIVER FUNCTION TESTS: Recent Labs    07/21/23 1159 07/22/23 0610 07/23/23 0554 07/24/23 0614 09/11/23 1528  BILITOT 0.5 0.5 0.5 0.3 0.4  AST 19 16 28  182* 17  ALT 11 11 14  82* 13  ALKPHOS 40 39 39 50  --   PROT 7.7 7.2 7.9 7.7 8.8*  ALBUMIN  1.9* 1.9* 2.0* 2.1*  --     TUMOR MARKERS: No results for input(s): "AFPTM", "CEA", "CA199", "CHROMGRNA" in the last 8760 hours.  Assessment and Plan: 38 y.o. male with hx HIV, GERD, CVA, CKD, chronic prostatic abscess, prior rt ureteral stenting, bilat hydronephrosis; s/p left NU cath 4/18; he is scheduled today for left nephrostogram with possible conversion of NU cath to JJ stent. Risks and benefits of  left nephrostogram/ureteral stent placement  was discussed with the patient including, but not limited to, infection, bleeding, significant  bleeding causing loss or decrease in renal function or damage to adjacent structures.   All of the patient's questions were answered, patient is agreeable to proceed.  Consent signed and in chart.      Thank you for allowing our service to participate in Jacob Gutierrez 's care.  Electronically Signed: D. Honore Lux, PA-C   12/27/2023, 12:57 PM      I spent a total of 25 minutes    in face to face in clinical consultation, greater than 50% of which was counseling/coordinating care for left nephrostogram with possible ureteral stent placement

## 2023-12-28 ENCOUNTER — Ambulatory Visit (HOSPITAL_COMMUNITY)
Admit: 2023-12-28 | Discharge: 2023-12-28 | Disposition: A | Discharge status: Home / Self Care | Source: Ambulatory Visit | Attending: Interventional Radiology | Admitting: Interventional Radiology

## 2023-12-28 ENCOUNTER — Other Ambulatory Visit: Payer: Self-pay | Admitting: Urology

## 2023-12-28 ENCOUNTER — Encounter (HOSPITAL_COMMUNITY): Payer: Self-pay

## 2023-12-28 ENCOUNTER — Ambulatory Visit (HOSPITAL_COMMUNITY)
Admit: 2023-12-28 | Discharge: 2023-12-28 | Disposition: A | Discharge status: Home / Self Care | Attending: Interventional Radiology

## 2023-12-28 ENCOUNTER — Other Ambulatory Visit: Payer: Self-pay

## 2023-12-28 DIAGNOSIS — N1330 Unspecified hydronephrosis: Secondary | ICD-10-CM | POA: Insufficient documentation

## 2023-12-28 DIAGNOSIS — N412 Abscess of prostate: Secondary | ICD-10-CM

## 2023-12-28 HISTORY — PX: IR URETERAL STENT PLACEMENT EXISTING ACCESS RIGHT: IMG6074

## 2023-12-28 LAB — CBC
HCT: 27.9 % — ABNORMAL LOW (ref 39.0–52.0)
Hemoglobin: 9 g/dL — ABNORMAL LOW (ref 13.0–17.0)
MCH: 32.4 pg (ref 26.0–34.0)
MCHC: 32.3 g/dL (ref 30.0–36.0)
MCV: 100.4 fL — ABNORMAL HIGH (ref 80.0–100.0)
Platelets: 207 10*3/uL (ref 150–400)
RBC: 2.78 MIL/uL — ABNORMAL LOW (ref 4.22–5.81)
RDW: 15 % (ref 11.5–15.5)
WBC: 6 10*3/uL (ref 4.0–10.5)
nRBC: 0 % (ref 0.0–0.2)

## 2023-12-28 LAB — BASIC METABOLIC PANEL WITH GFR
Anion gap: 7 (ref 5–15)
BUN: 15 mg/dL (ref 6–20)
CO2: 23 mmol/L (ref 22–32)
Calcium: 8.3 mg/dL — ABNORMAL LOW (ref 8.9–10.3)
Chloride: 103 mmol/L (ref 98–111)
Creatinine, Ser: 1.55 mg/dL — ABNORMAL HIGH (ref 0.61–1.24)
GFR, Estimated: 58 mL/min — ABNORMAL LOW (ref >60)
Glucose, Bld: 84 mg/dL (ref 70–99)
Potassium: 2.9 mmol/L — ABNORMAL LOW (ref 3.5–5.1)
Sodium: 133 mmol/L — ABNORMAL LOW (ref 135–145)

## 2023-12-28 LAB — PROTIME-INR
INR: 1.3 — ABNORMAL HIGH (ref 0.8–1.2)
Prothrombin Time: 16.2 s — ABNORMAL HIGH (ref 11.4–15.2)

## 2023-12-28 MED ORDER — FENTANYL CITRATE (PF) 100 MCG/2ML IJ SOLN
INTRAMUSCULAR | Status: AC
  Filled 2023-12-28: qty 2

## 2023-12-28 MED ORDER — FENTANYL CITRATE (PF) 100 MCG/2ML IJ SOLN
INTRAMUSCULAR | Status: AC
Start: 2023-12-28 — End: ?
  Filled 2023-12-28: qty 2

## 2023-12-28 MED ORDER — SODIUM CHLORIDE 0.9 % IV SOLN
1.0000 g | Freq: Once | INTRAVENOUS | Status: AC
  Administered 2023-12-28: 1 g via INTRAVENOUS
  Filled 2023-12-28 (×2): qty 20

## 2023-12-28 MED ORDER — SODIUM CHLORIDE 0.9 % IV SOLN: INTRAVENOUS | Status: DC

## 2023-12-28 MED ORDER — DIPHENHYDRAMINE HCL 50 MG/ML IJ SOLN
INTRAMUSCULAR | Status: AC
  Filled 2023-12-28: qty 1

## 2023-12-28 MED ORDER — LIDOCAINE-EPINEPHRINE 1 %-1:100000 IJ SOLN
INTRAMUSCULAR | Status: AC
  Filled 2023-12-28: qty 1

## 2023-12-28 MED ORDER — DIPHENHYDRAMINE HCL 50 MG/ML IJ SOLN
INTRAMUSCULAR | Status: AC | PRN
  Administered 2023-12-28: 25 mg via INTRAVENOUS

## 2023-12-28 MED ORDER — SODIUM CHLORIDE 0.9% FLUSH: 5.0000 mL | Freq: Three times a day (TID) | INTRAVENOUS | Status: DC

## 2023-12-28 MED ORDER — LIDOCAINE-EPINEPHRINE 1 %-1:100000 IJ SOLN: 20.0000 mL | Freq: Once | INTRAMUSCULAR | Status: DC

## 2023-12-28 MED ORDER — IOHEXOL 300 MG/ML  SOLN
50.0000 mL | Freq: Once | INTRAMUSCULAR | Status: AC | PRN
  Administered 2023-12-28: 20 mL

## 2023-12-28 MED ORDER — FENTANYL CITRATE (PF) 100 MCG/2ML IJ SOLN
INTRAMUSCULAR | Status: AC | PRN
  Administered 2023-12-28: 100 ug via INTRAVENOUS

## 2023-12-28 NOTE — Discharge Instructions (Addendum)
 Please call Interventional Radiology clinic [phone] with any questions or concerns.  You may remove your dressing and shower tomorrow.  After the procedure, it is common to have: Some soreness where the nephrostomy tube was inserted (tube insertion site) Blood-tinged drainage from the nephrostomy tube for the first 24 hours  Follow these instructions at home:  Medication: Do not use Aspirin or ibuprofen  products, such as Advil  or Motrin , as it may increase bleeding.  You may resume your usual medications as ordered by your doctor If your doctor prescribed antibiotics, take them as directed. Do not stop taking them just because you feel better. You need to take the full course of antibiotics.  Eating and drinking: Drink plenty of liquids to keep your urine pale yellow You can resume your regular diet as directed by your doctor  Care of the procedure site Follow instructions from your health care provider about how to take care of your tube insertion site. Make sure you: Wash your hands with soap and water for at least 20 seconds before you change your dressing. If soap and water are not available, use hand sanitizer. Change your dressing daily as told by your health care provider. Be careful not to pull on the tube while removing the dressing When you change the dressing, wash the skin around the tube, rinse well, and pat the skin dry Avoid using scissors to remove the dressing. Sharp objects may damage the catheter Check the tube insertion area every day for signs of infection. Check for: Redness, swelling, or pain Fluid or blood Warmth Pus or a bad smell  Activity Do not lift anything that is heavier than 10 lb (4.5 kg), or the limit that you are told, until your health care provider says that it is safe Return to your normal activities as told by your health care provider.  Avoid activities that may cause the nephrostomy tubing to bend Do not take baths, swim, or use a hot  tub until your health care provider approves. Ask your health care provider if you can take showers.  Cover the nephrostomy tube bandage (dressing) with a watertight covering when you take a shower Keep all follow-up visits as told by your doctor  Contact a health care provider if: You have persistent pain or soreness in your back You have redness, swelling, or pain around your tube insertion site You have fluid or blood coming from your tube insertion site Your tube insertion site feels warm to the touch You have pus or a bad smell coming from your tube insertion site You have increased urine output or you feel burning when urinating  Get help right away if: You have pain in your abdomen during the first week You have chest pain or have trouble breathing You have a new appearance of blood in your urine You have a fever or chills You have back pain that is not relieved by your medicine You have decreased urine output  Moderate Conscious Sedation-Care After  This sheet gives you information about how to care for yourself after your procedure. Your health care provider may also give you more specific instructions. If you have problems or questions, contact your health care provider.  After the procedure, it is common to have: Sleepiness for several hours. Impaired judgment for several hours. Difficulty with balance. Vomiting if you eat too soon.  Follow these instructions at home:  Rest. Do not participate in activities where you could fall or become injured. Do not drive or  use machinery. Do not drink alcohol. Do not take sleeping pills or medicines that cause drowsiness. Do not make important decisions or sign legal documents. Do not take care of children on your own.  Eating and drinking Follow the diet recommended by your health care provider. Drink enough fluid to keep your urine pale yellow. If you vomit: Drink water, juice, or soup when you can drink without  vomiting. Make sure you have little or no nausea before eating solid foods.  General instructions Take over-the-counter and prescription medicines only as told by your health care provider. Have a responsible adult stay with you for the time you are told. It is important to have someone help care for you until you are awake and alert. Do not smoke. Keep all follow-up visits as told by your health care provider. This is important.  Contact a health care provider if: You are still sleepy or having trouble with balance after 24 hours. You feel light-headed. You keep feeling nauseous or you keep vomiting. You develop a rash. You have a fever. You have redness or swelling around the IV site.  Get help right away if: You have trouble breathing. You have new-onset confusion at home.  This information is not intended to replace advice given to you by your health care provider. Make sure you discuss any questions you have with your healthcare provider.

## 2023-12-28 NOTE — Procedures (Signed)
 Vascular and Interventional Radiology Procedure Note  Patient: Jacob Gutierrez DOB: February 19, 1986 Medical Record Number: [id number] Note Date/Time: 12/28/23 12:28 PM   Performing Physician: Art Largo, MD Assistant(s): None  Diagnosis: Routine exchange.   Procedure:  RIGHT NEPHROURETERAL CATHETER EVALUATION and REMOVAL RIGHT ANTEROGRADE NEPHROSTOGRAM RIGHT URETERAL STENT PLACEMENT  Anesthesia: Conscious Sedation Complications: None Estimated Blood Loss: Minimal Specimens:  None  Findings:  Successful placement of a R 8.5 F, 26 cm ureteral stent. The nephroureteral catheter was removed.   See detailed procedure note with images in PACS. The patient tolerated the procedure well without incident or complication and was returned to Recovery in stable condition.    Art Largo, MD [facility] Radiology   Pager. [phone] Clinic. [phone]

## 2023-12-28 NOTE — OR Nursing (Signed)
 Orrin Blakes PA came to see patient, patient is stable and a/o x 4 for discharge and able to take bus transportation home.  Bus pass given.  Security called and made aware patient to be taken to bus stop by hospital shuttle [place].

## 2023-12-28 NOTE — OR Nursing (Signed)
 Patient was awaiting ride share when told he had to pay off previous balance owed to be able to ride home.  Patient returned to short stay and given bus pass by [employer]. Awaiting approval from IR MD/PA for patient to be discharged.

## 2023-12-29 ENCOUNTER — Other Ambulatory Visit (HOSPITAL_COMMUNITY)

## 2024-01-01 ENCOUNTER — Telehealth: Payer: Self-pay

## 2024-01-01 NOTE — Telephone Encounter (Signed)
 Detectable Viral Load Intervention (DVL)  Most recent VL:  HIV 1 RNA Quant  Date Value Ref Range Status  11/30/2023 351 (H) copies/mL Final  09/11/2023 7,360 (H) Copies/mL Final  07/20/2023 1,180,000 copies/mL Corrected    Comment:    (NOTE) The reportable range for this assay is 20 to 10,000,000 copies HIV-1 RNA/mL.     Last Clinic Visit: 11/30/23  Current ART regimen: Symtuza   Appointment status: patient has future appointment scheduled  Medication last dispensed (per chart review):   Dispensed Days Supply Quantity Provider Pharmacy  SYMTUZA  TABLETS 12/12/2023 30 30 each Calone, Gregory D, FNP [facility]...  SYMTUZA  TABLETS 09/11/2023 30 30 each Calone, Gregory D, FNP [facility]...  Darunavir -Cobicistat-Emtricitabine -Tenofovir  Alafenamide (SYMTUZA ) 800-150-200-10 MG TABS 08/10/2023 30 30 tablet Verlyn Goad, MD [facility]...  SYMTUZA  TABLET        Medication Adherence  Not able to assess    Barriers to Care  Not able to assess   Interventions   Called patient to discuss medication adherence and possible barriers to care.  Appt 6/10. Will forward message to MD to discuss medication adherence and discuss interventions.  Julien Odor, RMA

## 2024-01-02 ENCOUNTER — Ambulatory Visit: Admitting: Infectious Diseases

## 2024-01-09 ENCOUNTER — Other Ambulatory Visit: Payer: Self-pay | Admitting: Family

## 2024-04-05 ENCOUNTER — Inpatient Hospital Stay (HOSPITAL_COMMUNITY)
Admission: EM | Admit: 2024-04-05 | Discharge: 2024-04-11 | DRG: 659 | Disposition: A | Discharge status: Home / Self Care | Attending: Family Medicine | Admitting: Family Medicine

## 2024-04-05 ENCOUNTER — Other Ambulatory Visit: Payer: Self-pay

## 2024-04-05 ENCOUNTER — Encounter (HOSPITAL_COMMUNITY): Payer: Self-pay

## 2024-04-05 DIAGNOSIS — N390 Urinary tract infection, site not specified: Secondary | ICD-10-CM | POA: Diagnosis present

## 2024-04-05 DIAGNOSIS — T83592A Infection and inflammatory reaction due to indwelling ureteral stent, initial encounter: Secondary | ICD-10-CM | POA: Diagnosis not present

## 2024-04-05 DIAGNOSIS — N179 Acute kidney failure, unspecified: Secondary | ICD-10-CM | POA: Diagnosis present

## 2024-04-05 DIAGNOSIS — N1330 Unspecified hydronephrosis: Secondary | ICD-10-CM | POA: Diagnosis not present

## 2024-04-05 DIAGNOSIS — Z881 Allergy status to other antibiotic agents status: Secondary | ICD-10-CM

## 2024-04-05 DIAGNOSIS — M1990 Unspecified osteoarthritis, unspecified site: Secondary | ICD-10-CM | POA: Diagnosis present

## 2024-04-05 DIAGNOSIS — N170 Acute kidney failure with tubular necrosis: Secondary | ICD-10-CM | POA: Diagnosis present

## 2024-04-05 DIAGNOSIS — N136 Pyonephrosis: Secondary | ICD-10-CM | POA: Diagnosis present

## 2024-04-05 DIAGNOSIS — Z79899 Other long term (current) drug therapy: Secondary | ICD-10-CM

## 2024-04-05 DIAGNOSIS — Y838 Other surgical procedures as the cause of abnormal reaction of the patient, or of later complication, without mention of misadventure at the time of the procedure: Secondary | ICD-10-CM | POA: Diagnosis present

## 2024-04-05 DIAGNOSIS — F17210 Nicotine dependence, cigarettes, uncomplicated: Secondary | ICD-10-CM | POA: Diagnosis present

## 2024-04-05 DIAGNOSIS — Z8673 Personal history of transient ischemic attack (TIA), and cerebral infarction without residual deficits: Secondary | ICD-10-CM

## 2024-04-05 DIAGNOSIS — Z1612 Extended spectrum beta lactamase (ESBL) resistance: Secondary | ICD-10-CM | POA: Diagnosis present

## 2024-04-05 DIAGNOSIS — D638 Anemia in other chronic diseases classified elsewhere: Secondary | ICD-10-CM | POA: Diagnosis present

## 2024-04-05 DIAGNOSIS — B20 Human immunodeficiency virus [HIV] disease: Secondary | ICD-10-CM | POA: Diagnosis present

## 2024-04-05 DIAGNOSIS — Z9079 Acquired absence of other genital organ(s): Secondary | ICD-10-CM

## 2024-04-05 DIAGNOSIS — K219 Gastro-esophageal reflux disease without esophagitis: Secondary | ICD-10-CM | POA: Diagnosis present

## 2024-04-05 DIAGNOSIS — E8721 Acute metabolic acidosis: Secondary | ICD-10-CM | POA: Diagnosis present

## 2024-04-05 DIAGNOSIS — E871 Hypo-osmolality and hyponatremia: Secondary | ICD-10-CM | POA: Diagnosis present

## 2024-04-05 DIAGNOSIS — Z833 Family history of diabetes mellitus: Secondary | ICD-10-CM

## 2024-04-05 DIAGNOSIS — Z880 Allergy status to penicillin: Secondary | ICD-10-CM

## 2024-04-05 DIAGNOSIS — Z8042 Family history of malignant neoplasm of prostate: Secondary | ICD-10-CM

## 2024-04-05 DIAGNOSIS — N12 Tubulo-interstitial nephritis, not specified as acute or chronic: Principal | ICD-10-CM

## 2024-04-05 DIAGNOSIS — N1832 Chronic kidney disease, stage 3b: Secondary | ICD-10-CM | POA: Diagnosis present

## 2024-04-05 DIAGNOSIS — Z8619 Personal history of other infectious and parasitic diseases: Secondary | ICD-10-CM

## 2024-04-05 DIAGNOSIS — Z8661 Personal history of infections of the central nervous system: Secondary | ICD-10-CM

## 2024-04-05 DIAGNOSIS — Z91199 Patient's noncompliance with other medical treatment and regimen due to unspecified reason: Secondary | ICD-10-CM

## 2024-04-05 DIAGNOSIS — Z8249 Family history of ischemic heart disease and other diseases of the circulatory system: Secondary | ICD-10-CM

## 2024-04-05 DIAGNOSIS — Z87440 Personal history of urinary (tract) infections: Secondary | ICD-10-CM

## 2024-04-05 NOTE — ED Triage Notes (Signed)
 Patient c/o frequency and dysuria, hx of bladder problems and had to have foley placed, pain on the left flank.

## 2024-04-06 ENCOUNTER — Emergency Department (HOSPITAL_COMMUNITY)

## 2024-04-06 DIAGNOSIS — N1330 Unspecified hydronephrosis: Secondary | ICD-10-CM

## 2024-04-06 DIAGNOSIS — F17210 Nicotine dependence, cigarettes, uncomplicated: Secondary | ICD-10-CM | POA: Diagnosis present

## 2024-04-06 DIAGNOSIS — Z880 Allergy status to penicillin: Secondary | ICD-10-CM | POA: Diagnosis not present

## 2024-04-06 DIAGNOSIS — B20 Human immunodeficiency virus [HIV] disease: Secondary | ICD-10-CM | POA: Diagnosis present

## 2024-04-06 DIAGNOSIS — N390 Urinary tract infection, site not specified: Secondary | ICD-10-CM

## 2024-04-06 DIAGNOSIS — Z833 Family history of diabetes mellitus: Secondary | ICD-10-CM | POA: Diagnosis not present

## 2024-04-06 DIAGNOSIS — Z8661 Personal history of infections of the central nervous system: Secondary | ICD-10-CM | POA: Diagnosis not present

## 2024-04-06 DIAGNOSIS — N170 Acute kidney failure with tubular necrosis: Secondary | ICD-10-CM | POA: Diagnosis present

## 2024-04-06 DIAGNOSIS — Z1612 Extended spectrum beta lactamase (ESBL) resistance: Secondary | ICD-10-CM | POA: Diagnosis present

## 2024-04-06 DIAGNOSIS — Z79899 Other long term (current) drug therapy: Secondary | ICD-10-CM | POA: Diagnosis not present

## 2024-04-06 DIAGNOSIS — N134 Hydroureter: Secondary | ICD-10-CM | POA: Diagnosis not present

## 2024-04-06 DIAGNOSIS — Y838 Other surgical procedures as the cause of abnormal reaction of the patient, or of later complication, without mention of misadventure at the time of the procedure: Secondary | ICD-10-CM | POA: Diagnosis present

## 2024-04-06 DIAGNOSIS — T83592A Infection and inflammatory reaction due to indwelling ureteral stent, initial encounter: Secondary | ICD-10-CM | POA: Diagnosis present

## 2024-04-06 DIAGNOSIS — Z8673 Personal history of transient ischemic attack (TIA), and cerebral infarction without residual deficits: Secondary | ICD-10-CM | POA: Diagnosis not present

## 2024-04-06 DIAGNOSIS — M1990 Unspecified osteoarthritis, unspecified site: Secondary | ICD-10-CM | POA: Diagnosis present

## 2024-04-06 DIAGNOSIS — Z8619 Personal history of other infectious and parasitic diseases: Secondary | ICD-10-CM | POA: Diagnosis not present

## 2024-04-06 DIAGNOSIS — E871 Hypo-osmolality and hyponatremia: Secondary | ICD-10-CM | POA: Diagnosis present

## 2024-04-06 DIAGNOSIS — Z960 Presence of urogenital implants: Secondary | ICD-10-CM | POA: Diagnosis not present

## 2024-04-06 DIAGNOSIS — Z881 Allergy status to other antibiotic agents status: Secondary | ICD-10-CM | POA: Diagnosis not present

## 2024-04-06 DIAGNOSIS — N136 Pyonephrosis: Secondary | ICD-10-CM | POA: Diagnosis present

## 2024-04-06 DIAGNOSIS — Z91199 Patient's noncompliance with other medical treatment and regimen due to unspecified reason: Secondary | ICD-10-CM | POA: Diagnosis not present

## 2024-04-06 DIAGNOSIS — Z9079 Acquired absence of other genital organ(s): Secondary | ICD-10-CM | POA: Diagnosis not present

## 2024-04-06 DIAGNOSIS — N1832 Chronic kidney disease, stage 3b: Secondary | ICD-10-CM | POA: Diagnosis present

## 2024-04-06 DIAGNOSIS — I10 Essential (primary) hypertension: Secondary | ICD-10-CM | POA: Diagnosis not present

## 2024-04-06 DIAGNOSIS — Z8249 Family history of ischemic heart disease and other diseases of the circulatory system: Secondary | ICD-10-CM | POA: Diagnosis not present

## 2024-04-06 DIAGNOSIS — Z87440 Personal history of urinary (tract) infections: Secondary | ICD-10-CM | POA: Diagnosis not present

## 2024-04-06 DIAGNOSIS — D638 Anemia in other chronic diseases classified elsewhere: Secondary | ICD-10-CM | POA: Diagnosis present

## 2024-04-06 DIAGNOSIS — Z87448 Personal history of other diseases of urinary system: Secondary | ICD-10-CM | POA: Diagnosis not present

## 2024-04-06 DIAGNOSIS — I679 Cerebrovascular disease, unspecified: Secondary | ICD-10-CM | POA: Diagnosis not present

## 2024-04-06 DIAGNOSIS — Z8042 Family history of malignant neoplasm of prostate: Secondary | ICD-10-CM | POA: Diagnosis not present

## 2024-04-06 DIAGNOSIS — E8721 Acute metabolic acidosis: Secondary | ICD-10-CM | POA: Diagnosis present

## 2024-04-06 DIAGNOSIS — N132 Hydronephrosis with renal and ureteral calculous obstruction: Secondary | ICD-10-CM | POA: Diagnosis not present

## 2024-04-06 DIAGNOSIS — K219 Gastro-esophageal reflux disease without esophagitis: Secondary | ICD-10-CM | POA: Diagnosis present

## 2024-04-06 LAB — URINALYSIS, ROUTINE W REFLEX MICROSCOPIC
Bilirubin Urine: NEGATIVE
Glucose, UA: NEGATIVE mg/dL
Ketones, ur: NEGATIVE mg/dL
Nitrite: NEGATIVE
Protein, ur: 100 mg/dL — AB
RBC / HPF: >50 RBC/hpf (ref 0–5)
Specific Gravity, Urine: 1.009 (ref 1.005–1.030)
WBC, UA: >50 WBC/hpf (ref 0–5)
pH: 7 (ref 5.0–8.0)

## 2024-04-06 LAB — CBC WITH DIFFERENTIAL/PLATELET
Abs Immature Granulocytes: 0.08 K/uL — ABNORMAL HIGH (ref 0.00–0.07)
Basophils Absolute: 0 K/uL (ref 0.0–0.1)
Basophils Relative: 1 %
Eosinophils Absolute: 0.1 K/uL (ref 0.0–0.5)
Eosinophils Relative: 2 %
HCT: 30.9 % — ABNORMAL LOW (ref 39.0–52.0)
Hemoglobin: 9.8 g/dL — ABNORMAL LOW (ref 13.0–17.0)
Immature Granulocytes: 1 %
Lymphocytes Relative: 31 %
Lymphs Abs: 2.2 K/uL (ref 0.7–4.0)
MCH: 32 pg (ref 26.0–34.0)
MCHC: 31.7 g/dL (ref 30.0–36.0)
MCV: 101 fL — ABNORMAL HIGH (ref 80.0–100.0)
Monocytes Absolute: 1 K/uL (ref 0.1–1.0)
Monocytes Relative: 14 %
Neutro Abs: 3.7 K/uL (ref 1.7–7.7)
Neutrophils Relative %: 51 %
Platelets: 341 K/uL (ref 150–400)
RBC: 3.06 MIL/uL — ABNORMAL LOW (ref 4.22–5.81)
RDW: 15.9 % — ABNORMAL HIGH (ref 11.5–15.5)
WBC: 7.1 K/uL (ref 4.0–10.5)
nRBC: 0 % (ref 0.0–0.2)

## 2024-04-06 LAB — COMPREHENSIVE METABOLIC PANEL WITH GFR
ALT: 12 U/L (ref 0–44)
AST: 17 U/L (ref 15–41)
Albumin: 2.7 g/dL — ABNORMAL LOW (ref 3.5–5.0)
Alkaline Phosphatase: 54 U/L (ref 38–126)
Anion gap: 11 (ref 5–15)
BUN: 16 mg/dL (ref 6–20)
CO2: 20 mmol/L — ABNORMAL LOW (ref 22–32)
Calcium: 8.6 mg/dL — ABNORMAL LOW (ref 8.9–10.3)
Chloride: 101 mmol/L (ref 98–111)
Creatinine, Ser: 2.24 mg/dL — ABNORMAL HIGH (ref 0.61–1.24)
GFR, Estimated: 37 mL/min — ABNORMAL LOW (ref >60)
Glucose, Bld: 88 mg/dL (ref 70–99)
Potassium: 4 mmol/L (ref 3.5–5.1)
Sodium: 132 mmol/L — ABNORMAL LOW (ref 135–145)
Total Bilirubin: 0.5 mg/dL (ref 0.0–1.2)
Total Protein: 9.6 g/dL — ABNORMAL HIGH (ref 6.5–8.1)

## 2024-04-06 LAB — LIPASE, BLOOD: Lipase: 30 U/L (ref 11–51)

## 2024-04-06 MED ORDER — LACTATED RINGERS IV BOLUS
1000.0000 mL | Freq: Once | INTRAVENOUS | Status: AC
  Administered 2024-04-06: 1000 mL via INTRAVENOUS

## 2024-04-06 MED ORDER — ACETAMINOPHEN 325 MG PO TABS
650.0000 mg | ORAL_TABLET | Freq: Four times a day (QID) | ORAL | Status: DC | PRN
  Administered 2024-04-08 – 2024-04-11 (×2): 650 mg via ORAL
  Filled 2024-04-06 (×2): qty 2

## 2024-04-06 MED ORDER — ONDANSETRON HCL 4 MG/2ML IJ SOLN
4.0000 mg | Freq: Four times a day (QID) | INTRAMUSCULAR | Status: DC | PRN
  Administered 2024-04-06: 4 mg via INTRAVENOUS
  Filled 2024-04-06: qty 2

## 2024-04-06 MED ORDER — SODIUM CHLORIDE 0.9 % IV SOLN: INTRAVENOUS | Status: AC

## 2024-04-06 MED ORDER — PIPERACILLIN-TAZOBACTAM 3.375 G IVPB
3.3750 g | Freq: Three times a day (TID) | INTRAVENOUS | Status: DC
  Administered 2024-04-06 – 2024-04-08 (×7): 3.375 g via INTRAVENOUS
  Filled 2024-04-06 (×7): qty 50

## 2024-04-06 MED ORDER — ACETAMINOPHEN 650 MG RE SUPP: 650.0000 mg | Freq: Four times a day (QID) | RECTAL | Status: DC | PRN

## 2024-04-06 MED ORDER — DARUN-COBIC-EMTRICIT-TENOFAF 800-150-200-10 MG PO TABS
1.0000 | ORAL_TABLET | Freq: Every day | ORAL | Status: DC
  Administered 2024-04-06 – 2024-04-11 (×6): 1 via ORAL
  Filled 2024-04-06 (×7): qty 1

## 2024-04-06 MED ORDER — PANTOPRAZOLE SODIUM 40 MG PO TBEC
40.0000 mg | DELAYED_RELEASE_TABLET | Freq: Every day | ORAL | Status: DC
  Administered 2024-04-06 – 2024-04-11 (×5): 40 mg via ORAL
  Filled 2024-04-06 (×6): qty 1

## 2024-04-06 MED ORDER — ENOXAPARIN SODIUM 40 MG/0.4ML IJ SOSY
40.0000 mg | PREFILLED_SYRINGE | INTRAMUSCULAR | Status: DC
  Administered 2024-04-08 – 2024-04-11 (×4): 40 mg via SUBCUTANEOUS
  Filled 2024-04-06 (×4): qty 0.4

## 2024-04-06 MED ORDER — DAPSONE 100 MG PO TABS
100.0000 mg | ORAL_TABLET | Freq: Every day | ORAL | Status: DC
  Administered 2024-04-06 – 2024-04-11 (×6): 100 mg via ORAL
  Filled 2024-04-06 (×7): qty 1

## 2024-04-06 MED ORDER — MORPHINE SULFATE (PF) 4 MG/ML IV SOLN
4.0000 mg | Freq: Once | INTRAVENOUS | Status: AC
  Administered 2024-04-06: 4 mg via INTRAVENOUS
  Filled 2024-04-06: qty 1

## 2024-04-06 MED ORDER — IOHEXOL 350 MG/ML SOLN
60.0000 mL | Freq: Once | INTRAVENOUS | Status: AC | PRN
  Administered 2024-04-06: 60 mL via INTRAVENOUS

## 2024-04-06 NOTE — ED Notes (Signed)
 Pt ambulated to the bathroom.

## 2024-04-06 NOTE — ED Notes (Signed)
 ED pharamacist Dorn sent new request for meds, unable to find original meds that were sent to the unit

## 2024-04-06 NOTE — H&P (Signed)
 History and Physical    Jacob Gutierrez FMW:[id number] DOB: 01/27/1986 DOA: 04/05/2024  PCP: Pcp, No  Patient coming from: Home  I have personally briefly reviewed patient's old medical records in [facility]  Chief Complaint: Pain while urinating and increased frequency of urination  HPI: Jacob Gutierrez is a 38 y.o. male with medical history significant of HIV/AIDS, hx HIV, GERD, CVA, CKD, chronic prostatic abscess, prior rt ureteral stenting, bilat hydronephrosis, left ureteral obstruction; s/p left NU cath 4/18 who presents with left flank pain and urinary symptoms.  Patient states for the last 2 weeks he has been dribbling out of his penis like urinating when he does not mean to.  He has been wearing a diaper actually.  He does have a history of a nephrostomy tube on the left at some point.  He states that he had a fever yesterday to 49 F.  Denies any abdominal pain nausea, vomiting, diarrhea, hematochezia.  Denies flulike symptoms, shortness of breath, cough.  Denies any hematuria.   Per chart review patient was admitted to urology from 11/07/2023 to 11/15/2023. Per DC summary from that admission: Previously seen for large prostatic fluid collection/abscess extending into the bladder.  Due to the complicated nature of his case he was referred to Dr. Levonia of [facility] who examined his information and felt that his prostate abscess could be removed conventionally, directing him back to our practice.  He is HIV positive and has been noncompliant with previous PrEP for some time.  This is somewhat better though appears to be an ongoing issue.  He was taken back to the operating room by Dr. Watt on 11/07/2023 to address his prostatic abscess and associated bilateral hydronephrosis.  The prostate was able to be unroofed transurethrally.  The side was sucessfully stented, but the left could not be safely accessed. Pt ultimately received a percutaneous nephrostomy tube on this side.  He  underwent a capping trial while in the hospital but reported severe pain when urinating and asked that it be placed back to gravity drainage.  He has follow-up with interventional radiology where they will manage tube exchanges and consideration for antegrade stent placement in the interim.  Infectious disease is following him and provided him with 2 weeks of samples of his antibiotic as well as scheduled follow-up.     ED Course: Upon arrival to ED, patient was hypertensive and tachycardic but no fever, hypoxia or tachypnea. No leukocytosis.  Mild AKI on BMP.  Lipase normal.  CT abdomen pelvis shows following findings.  Patient given Zosyn  after speaking to me.  Urology has been consulted and will see in consultation.  Hospitalist consulted for admission.  IMPRESSION: 1. Tremendous / very Severe Right > Left Urothelial Thickening and Inflammation affecting the renal collecting systems and ureters, with bilateral hydroureteronephrosis. This despite well-positioned bilateral double-J ureteral stents. Less pronounced bladder wall thickening. No convincing pyelonephritis. Minimal nephrolithiasis. Chronic retroperitoneal lymphadenopathy, stable and likely reactive.   2. Seemingly diminutive prostate. Chronically dilated prostatic urethra / posterior urethra appears stable to improved since 07/24/2023 CT.  Review of Systems: As per HPI otherwise negative.    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)    GERD (gastroesophageal reflux disease)  H/O coccidioidomycosis    pulmonary    HIV disease (HCC)    Human monkeypox    Past history of allergy to penicillin -type antibiotic 07/03/2012   desensitization   Shigella gastroenteritis     Stroke [facility])    Syphilis    history /treated    Syphilis, unspecified 09/12/2016    Past Surgical History:  Procedure Laterality Date   BIOPSY  10/16/2020   Procedure: BIOPSY;  Surgeon: Wilhelmenia Aloha Raddle., MD;  Location: [facility] ENDOSCOPY;  Service: Gastroenterology;;   BIOPSY  10/23/2020   Procedure: BIOPSY;  Surgeon: Charlanne Groom, MD;  Location: [facility] ENDOSCOPY;  Service: Endoscopy;;   COLONOSCOPY WITH PROPOFOL  N/A 10/23/2020   Procedure: COLONOSCOPY WITH PROPOFOL ;  Surgeon: Charlanne Groom, MD;  Location: [facility] ENDOSCOPY;  Service: Endoscopy;  Laterality: N/A;   ESOPHAGOGASTRODUODENOSCOPY (EGD) WITH PROPOFOL  N/A 10/16/2020   Procedure: ESOPHAGOGASTRODUODENOSCOPY (EGD) WITH PROPOFOL ;  Surgeon: Wilhelmenia Aloha Raddle., MD;  Location: [facility] ENDOSCOPY;  Service: Gastroenterology;  Laterality: N/A;   IR  NEPHROURETERAL CATH PLACE LEFT  11/10/2023   IR URETERAL STENT PLACEMENT EXISTING ACCESS RIGHT  12/28/2023   TRANSURETHRAL RESECTION OF PROSTATE N/A 12/09/2017   Procedure: TRANSURETHRAL RESECTION OF THE PROSTATE (TURP);  Surgeon: Cam Morene ORN, MD;  Location: [facility] ORS;  Service: Urology;  Laterality: N/A;   TRANSURETHRAL RESECTION OF PROSTATE N/A 11/07/2023   Procedure: TURP (TRANSURETHRAL RESECTION OF PROSTATE);  Surgeon: Watt Rush, MD;  Location: [facility] ORS;  Service: Urology;  Laterality: N/A;  TRANSURETHRAL RESECTION OF PROSTATIC ABSCESS WITHCYSTO AND  BILATERAL RETROGRADES     reports that he has been smoking cigarettes and e-cigarettes. He has been exposed to tobacco smoke. He has never used smokeless tobacco. He reports current alcohol use of about 1.0 standard drink of alcohol per week. He reports current drug use. Frequency: 1.00 time per week. Drug: Marijuana.  Allergies  Allergen Reactions   Vancomycin  Anaphylaxis, Itching, Swelling and Other (See Comments)    Angioedema and everything swells   Amoxicillin Other (See Comments)    From childhood: I had a reaction when i was little.  (??) Has tolerated multiple cephalosporins in the past    Family History  Problem Relation Age of Onset   Hypertension Mother    Lupus Maternal Grandmother    Diabetes Maternal Grandmother    Cancer Paternal Grandfather        unknown    Prostate cancer Father    Diabetes Father    Diabetes Maternal Grandfather    Colon cancer Neg Hx    Stomach cancer Neg Hx    Esophageal cancer Neg Hx    Pancreatic cancer Neg Hx     Prior to Admission medications   Medication Sig Start Date End Date Taking? Authorizing Provider  dapsone  100 MG tablet Take 1 tablet (100 mg total) by mouth daily. 01/09/24  Yes Manandhar, Sabina, MD  Darunavir -Cobicistat-Emtricitabine -Tenofovir  Alafenamide (SYMTUZA ) 800-150-200-10 MG TABS Take 1 tablet by mouth daily with breakfast. 01/09/24  Yes Manandhar, Sabina, MD  Multiple Vitamins-Minerals (CERTAVITE/ANTIOXIDANTS) TABS Take 1 tablet by mouth daily.   Yes [provider]  omeprazole  (PRILOSEC) 40 MG capsule Take 40 mg by mouth daily.   Yes [provider]    Physical Exam: Vitals:   04/05/24 2122 04/06/24 0108 04/06/24 0536 04/06/24 0605  BP: (!) 143/114 (!) 134/99 (!) 125/91   Pulse: (!) 123 (!) 108 93   Resp: 16 19 18    Temp: 98.4 F (36.9 C) 99 F (37.2 C)  98.3 F (36.8  C)  TempSrc:  Oral  Oral  SpO2: 97% 98% 100%     Constitutional: NAD, calm, comfortable Vitals:   04/05/24 2122 04/06/24 0108 04/06/24 0536 04/06/24 0605  BP: (!) 143/114 (!) 134/99 (!) 125/91   Pulse: (!) 123 (!) 108 93   Resp: 16 19 18    Temp: 98.4 F (36.9 C) 99 F (37.2 C)  98.3 F (36.8 C)  TempSrc:  Oral  Oral  SpO2: 97% 98% 100%    Eyes: PERRL, lids and conjunctivae normal ENMT: Mucous membranes are moist. Posterior pharynx clear of any exudate or lesions.Normal dentition.  Neck: normal, supple, no masses, no thyromegaly Respiratory: clear to auscultation bilaterally, no wheezing, no crackles. Normal respiratory effort. No accessory muscle use.   Cardiovascular: Regular rate and rhythm, no murmurs / rubs / gallops. No extremity edema. 2+ pedal pulses. No carotid bruits.  Abdomen: no tenderness, no masses palpated. No hepatosplenomegaly. Bowel sounds positive.  Musculoskeletal: no clubbing / cyanosis. No joint deformity upper and lower extremities. Good ROM, no contractures. Normal muscle tone.  Skin: no rashes, lesions, ulcers. No induration Neurologic: CN 2-12 grossly intact. Sensation intact, DTR normal. Strength 5/5 in all 4.  Psychiatric: Normal judgment and insight. Alert and oriented x 3. Normal mood.    Labs on Admission: I have personally reviewed following labs and imaging studies  CBC: Recent Labs  Lab 04/06/24 0500  WBC 7.1  NEUTROABS 3.7  HGB 9.8*  HCT 30.9*  MCV 101.0*  PLT 341   Basic Metabolic Panel: Recent Labs  Lab 04/06/24 0500  NA 132*  K 4.0  CL 101  CO2 20*  GLUCOSE 88  BUN 16  CREATININE 2.24*  CALCIUM  8.6*   GFR: CrCl cannot be calculated (Unknown ideal weight.). Liver Function Tests: Recent Labs  Lab 04/06/24 0500  AST 17  ALT 12  ALKPHOS 54  BILITOT 0.5  PROT 9.6*  ALBUMIN  2.7*   Recent Labs  Lab 04/06/24 0500  LIPASE 30   No results for input(s): AMMONIA in the last 168 hours. Coagulation Profile: No results for input(s): INR, PROTIME in the last 168 hours. Cardiac Enzymes: No results for input(s): CKTOTAL, CKMB, CKMBINDEX, TROPONINI in the last 168 hours. BNP (last 3 results) No results for input(s): PROBNP in the last 8760 hours. HbA1C: No results for input(s): HGBA1C in the last 72 hours. CBG: No results for input(s): GLUCAP in the last 168 hours. Lipid Profile: No results for input(s): CHOL, HDL, LDLCALC, TRIG, CHOLHDL, LDLDIRECT in the last 72 hours. Thyroid Function Tests: No results for input(s): TSH, T4TOTAL, FREET4, T3FREE, THYROIDAB in the last 72 hours. Anemia Panel: No results for input(s): VITAMINB12,  FOLATE, FERRITIN, TIBC, IRON, RETICCTPCT in the last 72 hours. Urine analysis:    Component Value Date/Time   COLORURINE YELLOW 04/06/2024 0450   APPEARANCEUR TURBID (A) 04/06/2024 0450   APPEARANCEUR Hazy 07/27/2013 1742   LABSPEC 1.009 04/06/2024 0450   LABSPEC 1.026 07/27/2013 1742   PHURINE 7.0 04/06/2024 0450   GLUCOSEU NEGATIVE 04/06/2024 0450   GLUCOSEU Negative 07/27/2013 1742   HGBUR MODERATE (A) 04/06/2024 0450   BILIRUBINUR NEGATIVE 04/06/2024 0450   BILIRUBINUR Negative 07/27/2013 1742   KETONESUR NEGATIVE 04/06/2024 0450   PROTEINUR 100 (A) 04/06/2024 0450   UROBILINOGEN 1 12/17/2013 1118   NITRITE NEGATIVE 04/06/2024 0450   LEUKOCYTESUR MODERATE (A) 04/06/2024 0450   LEUKOCYTESUR Negative 07/27/2013 1742    Radiological Exams on Admission: CT ABDOMEN PELVIS W CONTRAST Result Date: 04/06/2024 CLINICAL DATA:  38 year old male with abdominal pain. History of nephrostomy tubes, ureteral stents. Prostatic abscess. EXAM: CT ABDOMEN AND PELVIS WITH CONTRAST TECHNIQUE: Multidetector CT imaging of the abdomen and pelvis was performed using the standard protocol following bolus administration of intravenous contrast. RADIATION DOSE REDUCTION: This exam was performed according to the departmental dose-optimization program which includes automated exposure control, adjustment of the mA and/or kV according to patient size and/or use of iterative reconstruction technique. CONTRAST:  60mL OMNIPAQUE  IOHEXOL  350 MG/ML SOLN COMPARISON:  Noncontrast CT Abdomen and Pelvis 07/24/2023. Report of nephrostomy tube exchange 12/28/2023. FINDINGS: Lower chest: Negative. Hepatobiliary: Negative liver. Gallbladder mildly distended but otherwise within normal limits. No biliary ductal dilatation. Pancreas: Negative. Spleen: Negative. Adrenals/Urinary Tract: Normal adrenal glands. Highly abnormal bilateral kidneys and ureters. Bilateral hydroureteronephrosis with severe urothelial soft tissue  thickening and inflammation (right ureter coronal image 64, right renal pelvis coronal image 59, proximal left ureter coronal image 51). And this despite the presence of bilateral double-J ureteral stents in place. The right stent proximal pigtail is in the lower pole of the right renal collecting system. The left proximal pigtail is in the renal pelvis. Both stents terminates in the urinary bladder. Severe right urothelial thickening continues distally, mild to moderate on the left (series 3, image 54). Similar bladder wall thickening, although the bladder is decompressed. Punctate right renal lower pole collecting system stone. No other discrete urinary calculus. Renal parenchymal enhancement seems relatively maintained, no obvious striated nephrogram despite the severe urothelial inflammation. Stomach/Bowel: Nondilated large and small bowel loops. Low-density retained stool in much of the colon. No large bowel wall thickening is identified. Appendix is not delineated. Decompressed stomach. No pneumoperitoneum or free fluid identified. Vascular/Lymphatic: Suboptimal contrast bolus. Grossly patent major vascular structures. No atherosclerosis identified. Retroperitoneal lymphadenopathy with individual nodes up to 13 mm short axis (series 3, images 35-48) does not appear significantly changed from last year. No cystic or necrotic nodes. Reproductive: Native prostate gland seems diminutive. Dilated, lobulated prostatic urethra suspected on series 3, images 83 and 84, and similar appearance of the posterior E through on series 3, image 90. These findings are stable to improved since 07/24/2023. No obvious regional inflammation. Other: No pelvis free fluid. Musculoskeletal: No acute or suspicious osseous lesion identified. IMPRESSION: 1. Tremendous / very Severe Right > Left Urothelial Thickening and Inflammation affecting the renal collecting systems and ureters, with bilateral hydroureteronephrosis. This despite  well-positioned bilateral double-J ureteral stents. Less pronounced bladder wall thickening. No convincing pyelonephritis. Minimal nephrolithiasis. Chronic retroperitoneal lymphadenopathy, stable and likely reactive. 2. Seemingly diminutive prostate. Chronically dilated prostatic urethra / posterior urethra appears stable to improved since 07/24/2023 CT. 3. Recommend Urology consultation. Electronically Signed   By: VEAR Hurst M.D.   On: 04/06/2024 06:56    Assessment/Plan Principal Problem:   Complicated UTI (urinary tract infection)   Complicated UTI in a patient with chronic prostate abscess and history of bilateral hydronephrosis and bilateral ureteral stenting: UA consistent with UTI.  CT does not show any new prostate abscess.  However shows inflammation affecting renal collecting system and ureters but no pyelonephritis per radiology report.  Continue Zosyn .  Urology to see and further management per them.  AKI: Baseline creatinine around 1-1.3, now presented with 2.24.  Likely in the setting of infection.  Will hydrate with IV fluids.  Avoid nephrotoxic agents.  Mild hyponatremia: 132 but he appears to be having this intermittent problem.  He is asymptomatic.  Could be due to dehydration.  IV fluids with normal saline  as mentioned above.  Repeat labs in the morning.  GERD: Resume PPI.  Anemia of chronic disease: Hemoglobin at baseline around 10, currently at baseline as well.  Monitor.  DVT prophylaxis: enoxaparin  (LOVENOX ) injection 40 mg Start: 04/07/24 0900 Code Status: Full code Family Communication: None present at bedside.  Plan of care discussed with patient in length and he verbalized understanding and agreed with it. Disposition Plan: Per urology Consults called: Urology  Fredia Skeeter MD [facility]  *Please note that this is a verbal dictation therefore any spelling or grammatical errors are due to the Dragon Medical One system interpretation.  Please page via  Amion and do not message via secure chat for urgent patient care matters. Secure chat can be used for non urgent patient care matters. 04/06/2024, 7:48 AM  To contact the attending provider between 7A-7P or the covering provider during after hours 7P-7A, please log into the web site [email or web address]

## 2024-04-06 NOTE — ED Provider Notes (Signed)
 SideArm Dr. Candace.  38 year old male with complicated urologic history including ESBL.  Also has HIV.  Here with urinary symptoms in the setting of having stents in place.  Urinalysis abnormal and CT showing significant hydro despite stents well-placed.  Urology Dr. Twylla has been consulted and will see in consult.  Page has been placed for admission.  Zosyn  ordered Physical Exam  BP (!) 125/91   Pulse 93   Temp 98.3 F (36.8 C) (Oral)   Resp 18   SpO2 100%   Physical Exam  Procedures  Procedures  ED Course / MDM   Clinical Course as of 04/06/24 0725  Sat Apr 06, 2024  0613 Creatinine(!): 2.24 +AKI [HN]  0613 Urinalysis, Routine w reflex microscopic -Urine, Clean Catch(!) +UTI, will give zosyn  based on prior culture data. Urine culture ordered. [HN]  0618 WBC: 7.1 No leukocytosis  [HN]  0619 Pulse Rate: 93 Improved tachycardia [HN]  0700 CT ABDOMEN PELVIS W CONTRAST 1. Tremendous / very Severe Right > Left Urothelial Thickening and Inflammation affecting the renal collecting systems and ureters, with bilateral hydroureteronephrosis. This despite well-positioned bilateral double-J ureteral stents. Less pronounced bladder wall thickening. No convincing pyelonephritis. Minimal nephrolithiasis. Chronic retroperitoneal lymphadenopathy, stable and likely reactive.  2. Seemingly diminutive prostate. Chronically dilated prostatic urethra / posterior urethra appears stable to improved since 07/24/2023 CT.   [HN]  0700 Consulting to urology [HN]    Clinical Course User Index [HN] Franklyn Sid SAILOR, MD   Medical Decision Making Amount and/or Complexity of Data Reviewed Labs: ordered. Decision-making details documented in ED Course. Radiology: ordered. Decision-making details documented in ED Course.  Risk Prescription drug management. Decision regarding hospitalization.   7:50 AM.  Discussed with admitting team who will evaluate patient for admission.       Towana Ozell BROCKS, MD 04/06/24 [phone]

## 2024-04-06 NOTE — Consult Note (Signed)
 Urology Consult  Requesting physician: Fredia Skeeter, MD  Reason for consultation: UTI   Assessment/Recommendations:  1.  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  Right ureteral stent indwelling since April 2025 and left ureteral stent since June 2025 Renal function has progressively worsened since April 2025 Bilateral urothelial thickening may be reactive secondary to chronic indwelling stent and UTI He has been started on IV antibiotics.  Repeat creatinine in a.m. Will make n.p.o. after midnight for possible bilateral retrograde pyelogram/stent exchange  2.  ESBL UTI-chronic Started on broad-spectrum IV antibiotic Urine culture pending   Chief Complaint: Burning with urination   History of Present Illness: Jacob Gutierrez is a 38 y.o. male with a complicated urologic history.  HIV/AIDS with history of ESBL E. coli UTIs.  Prostatic abscess unroofed by Dr. Cam 11/2017; perineal abscess I&D by Dr. Ottelin 05/2018 and seen by Dr. Rosalind during hospitalization 01/2021 for pyelonephritis Most recently hospitalized 07/24/2023.  CT showed bilateral hydronephrosis with hydroureter to the bladder with bladder wall thickening.  Had a prostatic fluid collection appearing to communicate with the bladder and extending to the prostatic apex.  There was a question if this fluid collection was a bladder diverticulum extending into the prostate and he saw Dr. Levonia at [facility] for further evaluation.  Refer to his note for details and he felt the fluid collection could be treated endoscopically. Underwent transurethral unroofing of prostatic abscess by Dr. Watt 11/07/2023.  Bilateral retrograde pyelograms remarkable for bilateral stricturing of the distal ureters.  A right ureteral stent was successfully placed and a left ureteral stent was unable to be placed.  Subsequently underwent left percutaneous nephroureteral catheter placement by IR on 11/10/2023.   This was converted to a ureteral stent on 12/28/2023. No urology follow-up since hospital discharge 11/15/2023  [facility] this morning with a 2-week history of dysuria and urge incontinence.  States he had a fever of 102 degrees yesterday.  The ED physician mention he was complaining of left flank pain however when I spoke with him he denied left or right sided flank pain.  No abdominal pain, nausea or vomiting Creatinine was 2.24 with a previous level of 1.55 on 12/28/2023 and 1.13 on 11/12/2023 CT abdomen pelvis with contrast with bilateral hydronephrosis/hydroureter.  Marked ureteral urothelial soft tissue thickening was noted R >L.  Prostate was small with a dilated prostatic urethra improved from prior CT of 06/2023.   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)    GERD (gastroesophageal reflux disease)    H/O coccidioidomycosis    pulmonary    HIV disease (HCC)    Human monkeypox    Past history of allergy to penicillin -type antibiotic 07/03/2012   desensitization   Shigella gastroenteritis    Stroke [facility])    Syphilis    history /treated    Syphilis, unspecified 09/12/2016    Past Surgical History:  Procedure Laterality Date   BIOPSY  10/16/2020   Procedure: BIOPSY;  Surgeon: Wilhelmenia Aloha Raddle., MD;  Location: [facility] ENDOSCOPY;  Service: Gastroenterology;;   BIOPSY  10/23/2020   Procedure: BIOPSY;  Surgeon: Charlanne Groom, MD;  Location: [facility] ENDOSCOPY;  Service: Endoscopy;;   COLONOSCOPY WITH PROPOFOL  N/A 10/23/2020   Procedure: COLONOSCOPY WITH  PROPOFOL ;  Surgeon: Charlanne Groom, MD;  Location: [facility] ENDOSCOPY;  Service: Endoscopy;  Laterality: N/A;   ESOPHAGOGASTRODUODENOSCOPY (EGD) WITH PROPOFOL  N/A 10/16/2020    Procedure: ESOPHAGOGASTRODUODENOSCOPY (EGD) WITH PROPOFOL ;  Surgeon: Wilhelmenia Aloha Raddle., MD;  Location: [facility] ENDOSCOPY;  Service: Gastroenterology;  Laterality: N/A;   IR  NEPHROURETERAL CATH PLACE LEFT  11/10/2023   IR URETERAL STENT PLACEMENT EXISTING ACCESS RIGHT  12/28/2023   TRANSURETHRAL RESECTION OF PROSTATE N/A 12/09/2017   Procedure: TRANSURETHRAL RESECTION OF THE PROSTATE (TURP);  Surgeon: Cam Morene ORN, MD;  Location: [facility] ORS;  Service: Urology;  Laterality: N/A;   TRANSURETHRAL RESECTION OF PROSTATE N/A 11/07/2023   Procedure: TURP (TRANSURETHRAL RESECTION OF PROSTATE);  Surgeon: Watt Rush, MD;  Location: [facility] ORS;  Service: Urology;  Laterality: N/A;  TRANSURETHRAL RESECTION OF PROSTATIC ABSCESS WITHCYSTO AND  BILATERAL RETROGRADES    Home Medications:  Current Meds  Medication Sig   dapsone  100 MG tablet Take 1 tablet (100 mg total) by mouth daily.   Darunavir -Cobicistat-Emtricitabine -Tenofovir  Alafenamide (SYMTUZA ) 800-150-200-10 MG TABS Take 1 tablet by mouth daily with breakfast.   Multiple Vitamins-Minerals (CERTAVITE/ANTIOXIDANTS) TABS Take 1 tablet by mouth daily.   omeprazole  (PRILOSEC) 40 MG capsule Take 40 mg by mouth daily.    Allergies:  Allergies  Allergen Reactions   Vancomycin  Anaphylaxis, Itching, Swelling and Other (See Comments)    Angioedema and everything swells   Amoxicillin Other (See Comments)    From childhood: I had a reaction when i was little. (??) Has tolerated multiple cephalosporins in the past and zosyn      Family History  Problem Relation Age of Onset   Hypertension Mother    Lupus Maternal Grandmother    Diabetes Maternal Grandmother    Cancer Paternal Grandfather        unknown    Prostate cancer Father    Diabetes Father    Diabetes Maternal Grandfather    Colon cancer Neg Hx    Stomach cancer Neg Hx    Esophageal cancer Neg Hx    Pancreatic cancer Neg Hx     Social History:  reports that he has been smoking cigarettes  and e-cigarettes. He has been exposed to tobacco smoke. He has never used smokeless tobacco. He reports current alcohol use of about 1.0 standard drink of alcohol per week. He reports current drug use. Frequency: 1.00 time per week. Drug: Marijuana.  ROS: As per the HPI  Physical Exam:  Vital signs in last 24 hours: Temp:  [98.3 F (36.8 C)-99 F (37.2 C)] 98.3 F (36.8 C) (09/13 0954) Pulse Rate:  [90-123] 90 (09/13 1148) Resp:  [15-19] 15 (09/13 1148) BP: (125-143)/(91-114) 128/94 (09/13 1148) SpO2:  [97 %-100 %] 100 % (09/13 1148) Weight:  [85.2 kg] 85.2 kg (09/13 0735) Constitutional:  Alert and oriented, No acute distress HEENT: [place] AT, moist mucus membranes.  Trachea midline, no masses Respiratory: Normal respiratory effort GI: Abdomen is soft, nontender, nondistended, no abdominal masses GU: No CVA tenderness Psychiatric: Normal mood and affect   Laboratory Data:  Recent Labs    04/06/24 0500  WBC 7.1  HGB 9.8*  HCT 30.9*   Recent Labs    04/06/24 0500  NA 132*  K 4.0  CL 101  CO2 20*  GLUCOSE 88  BUN 16  CREATININE 2.24*  CALCIUM  8.6*   No results for input(s): LABPT, INR in the last 72 hours. No results for input(s): LABURIN in the last 72 hours. Results for orders placed or performed in  visit on 11/30/23  GC/CT Probe, Amp (Throat)     Status: None   Collection Time: 11/30/23  2:21 PM   Specimen: Sterile Swab  Result Value Ref Range Status   Chlamydia trachomatis RNA NOT DETECTED NOT DETECTED Final   Neisseria gonorrhoeae RNA NOT DETECTED NOT DETECTED Final    Comment: The analytical performance characteristics of this assay have been determined by [employer]. The modifications have not been cleared or approved by the FDA. This assay has been validated pursuant to the CLIA regulations and is used for clinical purposes. SABRA   CT/NG RNA, TMA Rectal     Status: None   Collection Time: 11/30/23  2:21 PM   Specimen: Sterile Swab  Result  Value Ref Range Status   Chlamydia Trachomatis RNA NOT DETECTED NOT DETECTED Final   Neisseria Gonorrhoeae RNA NOT DETECTED NOT DETECTED Final    Comment: The analytical performance characteristics of this assay have been determined by [employer]. The modifications have not been cleared or approved by the FDA. This assay has been validated pursuant to the CLIA regulations and is used for clinical purposes. .      Radiologic Imaging: CT images were personally reviewed and interpreted  CT ABDOMEN PELVIS W CONTRAST Result Date: 04/06/2024 CLINICAL DATA:  38 year old male with abdominal pain. History of nephrostomy tubes, ureteral stents. Prostatic abscess. EXAM: CT ABDOMEN AND PELVIS WITH CONTRAST TECHNIQUE: Multidetector CT imaging of the abdomen and pelvis was performed using the standard protocol following bolus administration of intravenous contrast. RADIATION DOSE REDUCTION: This exam was performed according to the departmental dose-optimization program which includes automated exposure control, adjustment of the mA and/or kV according to patient size and/or use of iterative reconstruction technique. CONTRAST:  60mL OMNIPAQUE  IOHEXOL  350 MG/ML SOLN COMPARISON:  Noncontrast CT Abdomen and Pelvis 07/24/2023. Report of nephrostomy tube exchange 12/28/2023. FINDINGS: Lower chest: Negative. Hepatobiliary: Negative liver. Gallbladder mildly distended but otherwise within normal limits. No biliary ductal dilatation. Pancreas: Negative. Spleen: Negative. Adrenals/Urinary Tract: Normal adrenal glands. Highly abnormal bilateral kidneys and ureters. Bilateral hydroureteronephrosis with severe urothelial soft tissue thickening and inflammation (right ureter coronal image 64, right renal pelvis coronal image 59, proximal left ureter coronal image 51). And this despite the presence of bilateral double-J ureteral stents in place. The right stent proximal pigtail is in the lower pole of the right renal  collecting system. The left proximal pigtail is in the renal pelvis. Both stents terminates in the urinary bladder. Severe right urothelial thickening continues distally, mild to moderate on the left (series 3, image 54). Similar bladder wall thickening, although the bladder is decompressed. Punctate right renal lower pole collecting system stone. No other discrete urinary calculus. Renal parenchymal enhancement seems relatively maintained, no obvious striated nephrogram despite the severe urothelial inflammation. Stomach/Bowel: Nondilated large and small bowel loops. Low-density retained stool in much of the colon. No large bowel wall thickening is identified. Appendix is not delineated. Decompressed stomach. No pneumoperitoneum or free fluid identified. Vascular/Lymphatic: Suboptimal contrast bolus. Grossly patent major vascular structures. No atherosclerosis identified. Retroperitoneal lymphadenopathy with individual nodes up to 13 mm short axis (series 3, images 35-48) does not appear significantly changed from last year. No cystic or necrotic nodes. Reproductive: Native prostate gland seems diminutive. Dilated, lobulated prostatic urethra suspected on series 3, images 83 and 84, and similar appearance of the posterior E through on series 3, image 90. These findings are stable to improved since 07/24/2023. No obvious regional inflammation. Other: No pelvis free fluid. Musculoskeletal:  No acute or suspicious osseous lesion identified. IMPRESSION: 1. Tremendous / very Severe Right > Left Urothelial Thickening and Inflammation affecting the renal collecting systems and ureters, with bilateral hydroureteronephrosis. This despite well-positioned bilateral double-J ureteral stents. Less pronounced bladder wall thickening. No convincing pyelonephritis. Minimal nephrolithiasis. Chronic retroperitoneal lymphadenopathy, stable and likely reactive. 2. Seemingly diminutive prostate. Chronically dilated prostatic urethra /  posterior urethra appears stable to improved since 07/24/2023 CT. 3. Recommend Urology consultation. Electronically Signed   By: VEAR Hurst M.D.   On: 04/06/2024 06:56      04/06/2024, 1:29 PM  Glendia Barba,  MD

## 2024-04-06 NOTE — H&P (View-Only) (Signed)
 Urology Consult  Requesting physician: Fredia Skeeter, MD  Reason for consultation: UTI   Assessment/Recommendations:  1.  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  Right ureteral stent indwelling since April 2025 and left ureteral stent since June 2025 Renal function has progressively worsened since April 2025 Bilateral urothelial thickening may be reactive secondary to chronic indwelling stent and UTI He has been started on IV antibiotics.  Repeat creatinine in a.m. Will make n.p.o. after midnight for possible bilateral retrograde pyelogram/stent exchange  2.  ESBL UTI-chronic Started on broad-spectrum IV antibiotic Urine culture pending   Chief Complaint: Burning with urination   History of Present Illness: Jacob Gutierrez is a 38 y.o. male with a complicated urologic history.  HIV/AIDS with history of ESBL E. coli UTIs.  Prostatic abscess unroofed by Dr. Cam 11/2017; perineal abscess I&D by Dr. Ottelin 05/2018 and seen by Dr. Rosalind during hospitalization 01/2021 for pyelonephritis Most recently hospitalized 07/24/2023.  CT showed bilateral hydronephrosis with hydroureter to the bladder with bladder wall thickening.  Had a prostatic fluid collection appearing to communicate with the bladder and extending to the prostatic apex.  There was a question if this fluid collection was a bladder diverticulum extending into the prostate and he saw Dr. Levonia at [facility] for further evaluation.  Refer to his note for details and he felt the fluid collection could be treated endoscopically. Underwent transurethral unroofing of prostatic abscess by Dr. Watt 11/07/2023.  Bilateral retrograde pyelograms remarkable for bilateral stricturing of the distal ureters.  A right ureteral stent was successfully placed and a left ureteral stent was unable to be placed.  Subsequently underwent left percutaneous nephroureteral catheter placement by IR on 11/10/2023.   This was converted to a ureteral stent on 12/28/2023. No urology follow-up since hospital discharge 11/15/2023  [facility] this morning with a 2-week history of dysuria and urge incontinence.  States he had a fever of 102 degrees yesterday.  The ED physician mention he was complaining of left flank pain however when I spoke with him he denied left or right sided flank pain.  No abdominal pain, nausea or vomiting Creatinine was 2.24 with a previous level of 1.55 on 12/28/2023 and 1.13 on 11/12/2023 CT abdomen pelvis with contrast with bilateral hydronephrosis/hydroureter.  Marked ureteral urothelial soft tissue thickening was noted R >L.  Prostate was small with a dilated prostatic urethra improved from prior CT of 06/2023.   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)    GERD (gastroesophageal reflux disease)    H/O coccidioidomycosis    pulmonary    HIV disease (HCC)    Human monkeypox    Past history of allergy to penicillin -type antibiotic 07/03/2012   desensitization   Shigella gastroenteritis    Stroke [facility])    Syphilis    history /treated    Syphilis, unspecified 09/12/2016    Past Surgical History:  Procedure Laterality Date   BIOPSY  10/16/2020   Procedure: BIOPSY;  Surgeon: Wilhelmenia Aloha Raddle., MD;  Location: [facility] ENDOSCOPY;  Service: Gastroenterology;;   BIOPSY  10/23/2020   Procedure: BIOPSY;  Surgeon: Charlanne Groom, MD;  Location: [facility] ENDOSCOPY;  Service: Endoscopy;;   COLONOSCOPY WITH PROPOFOL  N/A 10/23/2020   Procedure: COLONOSCOPY WITH  PROPOFOL ;  Surgeon: Charlanne Groom, MD;  Location: [facility] ENDOSCOPY;  Service: Endoscopy;  Laterality: N/A;   ESOPHAGOGASTRODUODENOSCOPY (EGD) WITH PROPOFOL  N/A 10/16/2020    Procedure: ESOPHAGOGASTRODUODENOSCOPY (EGD) WITH PROPOFOL ;  Surgeon: Wilhelmenia Aloha Raddle., MD;  Location: [facility] ENDOSCOPY;  Service: Gastroenterology;  Laterality: N/A;   IR  NEPHROURETERAL CATH PLACE LEFT  11/10/2023   IR URETERAL STENT PLACEMENT EXISTING ACCESS RIGHT  12/28/2023   TRANSURETHRAL RESECTION OF PROSTATE N/A 12/09/2017   Procedure: TRANSURETHRAL RESECTION OF THE PROSTATE (TURP);  Surgeon: Cam Morene ORN, MD;  Location: [facility] ORS;  Service: Urology;  Laterality: N/A;   TRANSURETHRAL RESECTION OF PROSTATE N/A 11/07/2023   Procedure: TURP (TRANSURETHRAL RESECTION OF PROSTATE);  Surgeon: Watt Rush, MD;  Location: [facility] ORS;  Service: Urology;  Laterality: N/A;  TRANSURETHRAL RESECTION OF PROSTATIC ABSCESS WITHCYSTO AND  BILATERAL RETROGRADES    Home Medications:  Current Meds  Medication Sig   dapsone  100 MG tablet Take 1 tablet (100 mg total) by mouth daily.   Darunavir -Cobicistat-Emtricitabine -Tenofovir  Alafenamide (SYMTUZA ) 800-150-200-10 MG TABS Take 1 tablet by mouth daily with breakfast.   Multiple Vitamins-Minerals (CERTAVITE/ANTIOXIDANTS) TABS Take 1 tablet by mouth daily.   omeprazole  (PRILOSEC) 40 MG capsule Take 40 mg by mouth daily.    Allergies:  Allergies  Allergen Reactions   Vancomycin  Anaphylaxis, Itching, Swelling and Other (See Comments)    Angioedema and everything swells   Amoxicillin Other (See Comments)    From childhood: I had a reaction when i was little. (??) Has tolerated multiple cephalosporins in the past and zosyn      Family History  Problem Relation Age of Onset   Hypertension Mother    Lupus Maternal Grandmother    Diabetes Maternal Grandmother    Cancer Paternal Grandfather        unknown    Prostate cancer Father    Diabetes Father    Diabetes Maternal Grandfather    Colon cancer Neg Hx    Stomach cancer Neg Hx    Esophageal cancer Neg Hx    Pancreatic cancer Neg Hx     Social History:  reports that he has been smoking cigarettes  and e-cigarettes. He has been exposed to tobacco smoke. He has never used smokeless tobacco. He reports current alcohol use of about 1.0 standard drink of alcohol per week. He reports current drug use. Frequency: 1.00 time per week. Drug: Marijuana.  ROS: As per the HPI  Physical Exam:  Vital signs in last 24 hours: Temp:  [98.3 F (36.8 C)-99 F (37.2 C)] 98.3 F (36.8 C) (09/13 0954) Pulse Rate:  [90-123] 90 (09/13 1148) Resp:  [15-19] 15 (09/13 1148) BP: (125-143)/(91-114) 128/94 (09/13 1148) SpO2:  [97 %-100 %] 100 % (09/13 1148) Weight:  [85.2 kg] 85.2 kg (09/13 0735) Constitutional:  Alert and oriented, No acute distress HEENT: [place] AT, moist mucus membranes.  Trachea midline, no masses Respiratory: Normal respiratory effort GI: Abdomen is soft, nontender, nondistended, no abdominal masses GU: No CVA tenderness Psychiatric: Normal mood and affect   Laboratory Data:  Recent Labs    04/06/24 0500  WBC 7.1  HGB 9.8*  HCT 30.9*   Recent Labs    04/06/24 0500  NA 132*  K 4.0  CL 101  CO2 20*  GLUCOSE 88  BUN 16  CREATININE 2.24*  CALCIUM  8.6*   No results for input(s): LABPT, INR in the last 72 hours. No results for input(s): LABURIN in the last 72 hours. Results for orders placed or performed in  visit on 11/30/23  GC/CT Probe, Amp (Throat)     Status: None   Collection Time: 11/30/23  2:21 PM   Specimen: Sterile Swab  Result Value Ref Range Status   Chlamydia trachomatis RNA NOT DETECTED NOT DETECTED Final   Neisseria gonorrhoeae RNA NOT DETECTED NOT DETECTED Final    Comment: The analytical performance characteristics of this assay have been determined by [employer]. The modifications have not been cleared or approved by the FDA. This assay has been validated pursuant to the CLIA regulations and is used for clinical purposes. SABRA   CT/NG RNA, TMA Rectal     Status: None   Collection Time: 11/30/23  2:21 PM   Specimen: Sterile Swab  Result  Value Ref Range Status   Chlamydia Trachomatis RNA NOT DETECTED NOT DETECTED Final   Neisseria Gonorrhoeae RNA NOT DETECTED NOT DETECTED Final    Comment: The analytical performance characteristics of this assay have been determined by [employer]. The modifications have not been cleared or approved by the FDA. This assay has been validated pursuant to the CLIA regulations and is used for clinical purposes. .      Radiologic Imaging: CT images were personally reviewed and interpreted  CT ABDOMEN PELVIS W CONTRAST Result Date: 04/06/2024 CLINICAL DATA:  38 year old male with abdominal pain. History of nephrostomy tubes, ureteral stents. Prostatic abscess. EXAM: CT ABDOMEN AND PELVIS WITH CONTRAST TECHNIQUE: Multidetector CT imaging of the abdomen and pelvis was performed using the standard protocol following bolus administration of intravenous contrast. RADIATION DOSE REDUCTION: This exam was performed according to the departmental dose-optimization program which includes automated exposure control, adjustment of the mA and/or kV according to patient size and/or use of iterative reconstruction technique. CONTRAST:  60mL OMNIPAQUE  IOHEXOL  350 MG/ML SOLN COMPARISON:  Noncontrast CT Abdomen and Pelvis 07/24/2023. Report of nephrostomy tube exchange 12/28/2023. FINDINGS: Lower chest: Negative. Hepatobiliary: Negative liver. Gallbladder mildly distended but otherwise within normal limits. No biliary ductal dilatation. Pancreas: Negative. Spleen: Negative. Adrenals/Urinary Tract: Normal adrenal glands. Highly abnormal bilateral kidneys and ureters. Bilateral hydroureteronephrosis with severe urothelial soft tissue thickening and inflammation (right ureter coronal image 64, right renal pelvis coronal image 59, proximal left ureter coronal image 51). And this despite the presence of bilateral double-J ureteral stents in place. The right stent proximal pigtail is in the lower pole of the right renal  collecting system. The left proximal pigtail is in the renal pelvis. Both stents terminates in the urinary bladder. Severe right urothelial thickening continues distally, mild to moderate on the left (series 3, image 54). Similar bladder wall thickening, although the bladder is decompressed. Punctate right renal lower pole collecting system stone. No other discrete urinary calculus. Renal parenchymal enhancement seems relatively maintained, no obvious striated nephrogram despite the severe urothelial inflammation. Stomach/Bowel: Nondilated large and small bowel loops. Low-density retained stool in much of the colon. No large bowel wall thickening is identified. Appendix is not delineated. Decompressed stomach. No pneumoperitoneum or free fluid identified. Vascular/Lymphatic: Suboptimal contrast bolus. Grossly patent major vascular structures. No atherosclerosis identified. Retroperitoneal lymphadenopathy with individual nodes up to 13 mm short axis (series 3, images 35-48) does not appear significantly changed from last year. No cystic or necrotic nodes. Reproductive: Native prostate gland seems diminutive. Dilated, lobulated prostatic urethra suspected on series 3, images 83 and 84, and similar appearance of the posterior E through on series 3, image 90. These findings are stable to improved since 07/24/2023. No obvious regional inflammation. Other: No pelvis free fluid. Musculoskeletal:  No acute or suspicious osseous lesion identified. IMPRESSION: 1. Tremendous / very Severe Right > Left Urothelial Thickening and Inflammation affecting the renal collecting systems and ureters, with bilateral hydroureteronephrosis. This despite well-positioned bilateral double-J ureteral stents. Less pronounced bladder wall thickening. No convincing pyelonephritis. Minimal nephrolithiasis. Chronic retroperitoneal lymphadenopathy, stable and likely reactive. 2. Seemingly diminutive prostate. Chronically dilated prostatic urethra /  posterior urethra appears stable to improved since 07/24/2023 CT. 3. Recommend Urology consultation. Electronically Signed   By: VEAR Hurst M.D.   On: 04/06/2024 06:56      04/06/2024, 1:29 PM  Glendia Barba,  MD

## 2024-04-06 NOTE — ED Provider Notes (Signed)
 Jacob Gutierrez AT [facility] Provider Note   CSN: [id number] Arrival date & time: 04/05/24  2114     History  Chief Complaint  Patient presents with   Urinary Frequency   Dysuria    KOHLER Gutierrez is a 38 y.o. male with HIV/AIDS, hx HIV, GERD, CVA, CKD, chronic prostatic abscess, prior rt ureteral stenting, bilat hydronephrosis, left ureteral obstruction; s/p left NU cath 4/18 who presents with left flank pain and urinary symptoms.  Patient states for the last 2 weeks he has been dribbling out of his penis like urinating when he does not mean to.  He has been wearing a diaper actually.  He does have a history of a nephrostomy tube on the left at some point.  He states that he had a fever yesterday to 47 F.  Denies any abdominal pain nausea vomiting diarrhea hematochezia.  Denies flulike symptoms, shortness of breath, cough.  Denies any hematuria.  Per chart review patient was admitted to urology from 11/07/2023 to 11/15/2023. Per DC summary from that admission: Previously seen for large prostatic fluid collection/abscess extending into the bladder.  Due to the complicated nature of his case he was referred to Dr. Levonia of [facility] who examined his information and felt that his prostate abscess could be removed conventionally, directing him back to our practice.  He is HIV positive and has been noncompliant with previous PrEP for some time.  This is somewhat better though appears to be an ongoing issue.  He was taken back to the operating room by Dr. Watt on 11/07/2023 to address his prostatic abscess and associated bilateral hydronephrosis.  The prostate was able to be unroofed transurethrally.  The side was sucessfully stented, but the left could not be safely accessed. Pt ultimately received a percutaneous nephrostomy tube on this side.  He underwent a capping trial while in the hospital but reported severe pain when urinating and asked that it be placed back  to gravity drainage.  He has follow-up with interventional radiology where they will manage tube exchanges and consideration for antegrade stent placement in the interim.  Infectious disease is following him and provided him with 2 weeks of samples of his antibiotic as well as scheduled follow-up.    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 Authorizing Provider  acetaminophen  (TYLENOL ) 500 MG tablet Take 1 tablet (500 mg total) by mouth every 6 (six) hours as needed. 10/27/22   Charlyn Sora, MD  dapsone  100 MG tablet Take 1 tablet (100 mg total) by mouth daily. 01/09/24   Manandhar, Sabina, MD  Darunavir -Cobicistat-Emtricitabine -Tenofovir  Alafenamide (SYMTUZA ) 800-150-200-10 MG TABS Take 1 tablet by mouth daily with breakfast. 01/09/24   Dea Shiner, MD  omeprazole  (PRILOSEC) 40 MG  capsule Take 40 mg by mouth daily.    [provider]  sodium bicarbonate  650 MG tablet Take 1,300 mg by mouth 2 (two) times daily.    [provider]  tamsulosin  (FLOMAX ) 0.4 MG CAPS capsule TAKE 1 CAPSULE(0.4 MG) BY MOUTH DAILY AFTER SUPPER Patient taking differently: Take 0.4 mg by mouth in the morning. 10/09/23   Calone, Gregory D, FNP      Allergies    Vancomycin  and Amoxicillin    Review of Systems    Review of Systems A 10 point review of systems was performed and is negative unless otherwise reported in HPI.  Physical Exam Updated Vital Signs BP (!) 134/99 (BP Location: Right Arm)   Pulse (!) 108   Temp 99 F (37.2 C) (Oral)   Resp 19   SpO2 98%  Physical Exam General: Normal appearing male, lying in bed.  HEENT: PERRLA, Sclera anicteric, MMM, trachea midline.  Cardiology: RRR, no murmurs/rubs/gallops. BL radial and DP pulses equal bilaterally.  Resp: Normal respiratory rate and effort. CTAB, no wheezes, rhonchi, crackles.  Abd: Soft, non-tender, non-distended. No rebound tenderness or guarding.  GU: Deferred. MSK: No peripheral edema or signs of trauma. Extremities without deformity or TTP. No cyanosis or clubbing. Skin: warm, dry. No rashes or lesions. Back: No CVA tenderness Neuro: A&Ox4, CNs II-XII grossly intact. MAEs. Sensation grossly intact.  Psych: Normal mood and affect.   ED Results / Procedures / Treatments   Labs (all labs ordered are listed, but only abnormal results are displayed) Labs Reviewed  URINALYSIS, ROUTINE W REFLEX MICROSCOPIC - Abnormal; Notable for the following components:      Result Value   APPearance TURBID (*)    Hgb urine dipstick MODERATE (*)    Protein, ur 100 (*)    Leukocytes,Ua MODERATE (*)    Bacteria, UA MANY (*)    Non Squamous Epithelial 0-5 (*)    All other components within normal limits  CBC WITH DIFFERENTIAL/PLATELET  COMPREHENSIVE METABOLIC PANEL WITH GFR  LIPASE, BLOOD    EKG None  Radiology CT abd pelvis w contrast: 1. Tremendous / very Severe Right > Left Urothelial Thickening and Inflammation affecting the renal collecting systems and ureters, with bilateral hydroureteronephrosis. This despite well-positioned bilateral double-J ureteral stents. Less pronounced bladder wall thickening. No convincing pyelonephritis. Minimal nephrolithiasis. Chronic retroperitoneal lymphadenopathy, stable and likely reactive.    2. Seemingly diminutive prostate. Chronically dilated prostatic urethra / posterior urethra appears stable to improved since 07/24/2023 CT.   3. Recommend Urology consultation.     Procedures Procedures    Medications Ordered in ED Medications  lactated ringers  bolus 1,000 mL (has no administration in time range)  ondansetron  (ZOFRAN ) injection 4 mg (has no administration in time range)  morphine  (PF) 4 MG/ML injection 4 mg (has no administration in time range)    ED Course/ Medical Decision Making/ A&P                          Medical Decision Making Amount and/or Complexity of Data Reviewed Labs: ordered. Decision-making details documented in ED Course. Radiology: ordered. Decision-making details documented in ED Course.  Risk Prescription drug management. Decision regarding hospitalization.    This patient presents to the ED for concern of fever, flank pain, urinary incontinence; this involves an extensive number of treatment options, and is a complaint that carries with it a high risk of complications and morbidity.  I considered the following differential and admission  for this acute, potentially life threatening condition.   MDM:    Pt w/ extensive urologic history. Consider UTI or pyelonephritis or prostatic abscess as patient has had before. Also consider hydronephrosis, urolithiasis, renal abscess.  *If no findings on patient's UA or CT renal stone study, may consider an MRI lumbar spine to rule out cauda equina or spinal epidural abscess given his AIDS status and fever w/ urinary incontinence, but previously patient's sxs have been urologic in nature.  Clinical Course as of 04/08/24 2256  Sat Apr 06, 2024  0613 Creatinine(!): 2.24 +AKI [HN]  9386 Urinalysis, Routine w reflex microscopic -Urine, Clean Catch(!) +UTI, will give zosyn  based on prior culture data. Urine culture ordered. [HN]  0618 WBC: 7.1 No leukocytosis  [HN]  0619 Pulse Rate: 93 Improved  tachycardia [HN]  0700 CT ABDOMEN PELVIS W CONTRAST 1. Tremendous / very Severe Right > Left Urothelial Thickening and Inflammation affecting the renal collecting systems and ureters, with bilateral hydroureteronephrosis. This despite well-positioned bilateral double-J ureteral stents. Less pronounced bladder wall thickening. No convincing pyelonephritis. Minimal nephrolithiasis. Chronic retroperitoneal lymphadenopathy, stable and likely reactive.  2. Seemingly diminutive prostate. Chronically dilated prostatic urethra / posterior urethra appears stable to improved since 07/24/2023 CT.   [HN]  0700 Consulting to urology [HN]    Clinical Course User Index [HN] Franklyn Sid SAILOR, MD   Urology Dr. Twylla will see patient in consult and wonders if patient will need replacement of his nephrostomy tubes. I have ordered zosyn  given patient's prior h/o ESBL UTI/pyelonephritis. Patient is pending admission.    Labs: I Ordered, and personally interpreted labs.  The pertinent results include: Those listed above  Imaging Studies ordered: I ordered imaging studies including CT renal stone study I independently visualized and interpreted imaging. 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)    GERD (gastroesophageal reflux disease)    H/O coccidioidomycosis    pulmonary    HIV disease  (HCC)    Human monkeypox    Past history of allergy to penicillin -type antibiotic 07/03/2012   desensitization   Shigella gastroenteritis    Stroke [facility])    Syphilis    history /treated    Syphilis, unspecified 09/12/2016     Medicines Meds ordered this encounter  Medications   lactated ringers  bolus 1,000 mL   ondansetron  (ZOFRAN ) injection 4 mg   morphine  (PF) 4 MG/ML injection 4 mg    I have reviewed the patients home medicines and have made adjustments as needed  Problem List / ED Course: Problem List Items Addressed This Visit       Genitourinary   AKI (acute kidney injury) (HCC)   Other Visit Diagnoses       Pyelonephritis    -  Primary   Relevant Medications   dapsone  tablet 100 mg   Darunavir -Cobicistat-Emtricitabine -Tenofovir  Alafenamide (SYMTUZA ) 800-150-200-10 MG TABS 1 tablet   cefTRIAXone  (ROCEPHIN ) 2 g in sodium chloride  0.9 % 100 mL  IVPB     Hydroureteronephrosis                       This note was created using dictation software, which may contain spelling or grammatical errors.    Franklyn Sid SAILOR, MD 04/08/24 [phone]

## 2024-04-06 NOTE — ED Notes (Signed)
 Waiting on medications tubed from main pharmacy

## 2024-04-06 NOTE — Plan of Care (Signed)

## 2024-04-06 NOTE — ED Notes (Signed)
 CCMD called.

## 2024-04-06 NOTE — ED Notes (Signed)
 Attempted to obtain labs with no success. RN notified

## 2024-04-07 ENCOUNTER — Encounter (HOSPITAL_COMMUNITY)
Admission: EM | Disposition: A | Discharge status: Home / Self Care | Payer: Self-pay | Source: Home / Self Care | Attending: Family Medicine

## 2024-04-07 ENCOUNTER — Inpatient Hospital Stay (HOSPITAL_COMMUNITY)

## 2024-04-07 ENCOUNTER — Encounter (HOSPITAL_COMMUNITY): Payer: Self-pay | Admitting: Family Medicine

## 2024-04-07 DIAGNOSIS — N132 Hydronephrosis with renal and ureteral calculous obstruction: Secondary | ICD-10-CM | POA: Diagnosis not present

## 2024-04-07 DIAGNOSIS — Z960 Presence of urogenital implants: Secondary | ICD-10-CM

## 2024-04-07 DIAGNOSIS — F17210 Nicotine dependence, cigarettes, uncomplicated: Secondary | ICD-10-CM | POA: Diagnosis not present

## 2024-04-07 DIAGNOSIS — I10 Essential (primary) hypertension: Secondary | ICD-10-CM | POA: Diagnosis not present

## 2024-04-07 DIAGNOSIS — N390 Urinary tract infection, site not specified: Secondary | ICD-10-CM | POA: Diagnosis not present

## 2024-04-07 DIAGNOSIS — I679 Cerebrovascular disease, unspecified: Secondary | ICD-10-CM | POA: Diagnosis not present

## 2024-04-07 DIAGNOSIS — Z87440 Personal history of urinary (tract) infections: Secondary | ICD-10-CM

## 2024-04-07 DIAGNOSIS — Z87448 Personal history of other diseases of urinary system: Secondary | ICD-10-CM

## 2024-04-07 HISTORY — PX: CYSTOSCOPY W/ URETERAL STENT PLACEMENT: SHX1429

## 2024-04-07 LAB — COMPREHENSIVE METABOLIC PANEL WITH GFR
ALT: 10 U/L (ref 0–44)
AST: 14 U/L — ABNORMAL LOW (ref 15–41)
Albumin: 1.9 g/dL — ABNORMAL LOW (ref 3.5–5.0)
Alkaline Phosphatase: 46 U/L (ref 38–126)
Anion gap: 6 (ref 5–15)
BUN: 19 mg/dL (ref 6–20)
CO2: 20 mmol/L — ABNORMAL LOW (ref 22–32)
Calcium: 7.8 mg/dL — ABNORMAL LOW (ref 8.9–10.3)
Chloride: 107 mmol/L (ref 98–111)
Creatinine, Ser: 2.57 mg/dL — ABNORMAL HIGH (ref 0.61–1.24)
GFR, Estimated: 31 mL/min — ABNORMAL LOW (ref >60)
Glucose, Bld: 115 mg/dL — ABNORMAL HIGH (ref 70–99)
Potassium: 3.8 mmol/L (ref 3.5–5.1)
Sodium: 133 mmol/L — ABNORMAL LOW (ref 135–145)
Total Bilirubin: 0.6 mg/dL (ref 0.0–1.2)
Total Protein: 7.9 g/dL (ref 6.5–8.1)

## 2024-04-07 LAB — VITAMIN B12: Vitamin B-12: 747 pg/mL (ref 180–914)

## 2024-04-07 LAB — FERRITIN: Ferritin: 161 ng/mL (ref 24–336)

## 2024-04-07 LAB — RETICULOCYTES
Immature Retic Fract: 15.7 % (ref 2.3–15.9)
RBC.: 2.54 MIL/uL — ABNORMAL LOW (ref 4.22–5.81)
Retic Count, Absolute: 58.2 K/uL (ref 19.0–186.0)
Retic Ct Pct: 2.3 % (ref 0.4–3.1)

## 2024-04-07 LAB — CBC
HCT: 26.2 % — ABNORMAL LOW (ref 39.0–52.0)
Hemoglobin: 8.3 g/dL — ABNORMAL LOW (ref 13.0–17.0)
MCH: 31.7 pg (ref 26.0–34.0)
MCHC: 31.7 g/dL (ref 30.0–36.0)
MCV: 100 fL (ref 80.0–100.0)
Platelets: 277 K/uL (ref 150–400)
RBC: 2.62 MIL/uL — ABNORMAL LOW (ref 4.22–5.81)
RDW: 15.6 % — ABNORMAL HIGH (ref 11.5–15.5)
WBC: 5.4 K/uL (ref 4.0–10.5)
nRBC: 0 % (ref 0.0–0.2)

## 2024-04-07 LAB — IRON AND TIBC
Iron: 35 ug/dL — ABNORMAL LOW (ref 45–182)
Saturation Ratios: 19 % (ref 17.9–39.5)
TIBC: 189 ug/dL — ABNORMAL LOW (ref 250–450)
UIBC: 154 ug/dL

## 2024-04-07 LAB — TRANSFERRIN: Transferrin: 109 mg/dL — ABNORMAL LOW (ref 180–329)

## 2024-04-07 LAB — OCCULT BLOOD X 1 CARD TO LAB, STOOL: Fecal Occult Bld: NEGATIVE

## 2024-04-07 LAB — FOLATE: Folate: 10.2 ng/mL (ref >5.9)

## 2024-04-07 SURGERY — CYSTOSCOPY, WITH RETROGRADE PYELOGRAM AND URETERAL STENT INSERTION
Anesthesia: General | Site: Ureter | Laterality: Bilateral

## 2024-04-07 MED ORDER — OXYCODONE HCL 5 MG/5ML PO SOLN: 5.0000 mg | Freq: Once | ORAL | Status: DC | PRN

## 2024-04-07 MED ORDER — PROPOFOL 10 MG/ML IV BOLUS
INTRAVENOUS | Status: DC | PRN
  Administered 2024-04-07: 10 mg via INTRAVENOUS
  Administered 2024-04-07: 20 mg via INTRAVENOUS
  Administered 2024-04-07: 170 mg via INTRAVENOUS

## 2024-04-07 MED ORDER — TAMSULOSIN HCL 0.4 MG PO CAPS
0.4000 mg | ORAL_CAPSULE | Freq: Every day | ORAL | Status: DC
  Administered 2024-04-07 – 2024-04-11 (×5): 0.4 mg via ORAL
  Filled 2024-04-07 (×5): qty 1

## 2024-04-07 MED ORDER — SODIUM CHLORIDE 0.9 % IV SOLN: INTRAVENOUS | Status: DC

## 2024-04-07 MED ORDER — LIDOCAINE 2% (20 MG/ML) 5 ML SYRINGE
INTRAMUSCULAR | Status: DC | PRN
Start: 2024-04-07 — End: 2024-04-07
  Administered 2024-04-07: 100 mg via INTRAVENOUS

## 2024-04-07 MED ORDER — DROPERIDOL 2.5 MG/ML IJ SOLN: 0.6250 mg | Freq: Once | INTRAMUSCULAR | Status: DC | PRN

## 2024-04-07 MED ORDER — DEXAMETHASONE SODIUM PHOSPHATE 10 MG/ML IJ SOLN
INTRAMUSCULAR | Status: DC | PRN
Start: 2024-04-07 — End: 2024-04-07
  Administered 2024-04-07: 10 mg via INTRAVENOUS

## 2024-04-07 MED ORDER — OXYBUTYNIN CHLORIDE 5 MG PO TABS: 5.0000 mg | ORAL_TABLET | Freq: Three times a day (TID) | ORAL | Status: DC | PRN

## 2024-04-07 MED ORDER — MIDAZOLAM HCL 2 MG/2ML IJ SOLN
INTRAMUSCULAR | Status: DC | PRN
  Administered 2024-04-07: 2 mg via INTRAVENOUS

## 2024-04-07 MED ORDER — FENTANYL CITRATE (PF) 100 MCG/2ML IJ SOLN: 25.0000 ug | INTRAMUSCULAR | Status: DC | PRN

## 2024-04-07 MED ORDER — OXYCODONE HCL 5 MG PO TABS: 5.0000 mg | ORAL_TABLET | Freq: Once | ORAL | Status: DC | PRN

## 2024-04-07 MED ORDER — FENTANYL CITRATE (PF) 250 MCG/5ML IJ SOLN
INTRAMUSCULAR | Status: AC
  Filled 2024-04-07: qty 5

## 2024-04-07 MED ORDER — CHLORHEXIDINE GLUCONATE 0.12 % MT SOLN
15.0000 mL | Freq: Once | OROMUCOSAL | Status: AC
  Administered 2024-04-07: 15 mL via OROMUCOSAL

## 2024-04-07 MED ORDER — ORAL CARE MOUTH RINSE: 15.0000 mL | Freq: Once | OROMUCOSAL | Status: AC

## 2024-04-07 MED ORDER — IOHEXOL 300 MG/ML  SOLN
INTRAMUSCULAR | Status: DC | PRN
  Administered 2024-04-07: 10 mL via URETHRAL

## 2024-04-07 MED ORDER — MIDAZOLAM HCL 2 MG/2ML IJ SOLN
INTRAMUSCULAR | Status: AC
  Filled 2024-04-07: qty 2

## 2024-04-07 MED ORDER — 0.9 % SODIUM CHLORIDE (POUR BTL) OPTIME
TOPICAL | Status: DC | PRN
  Administered 2024-04-07: 1000 mL

## 2024-04-07 MED ORDER — PROPOFOL 10 MG/ML IV BOLUS
INTRAVENOUS | Status: AC
  Filled 2024-04-07: qty 20

## 2024-04-07 MED ORDER — ONDANSETRON HCL 4 MG/2ML IJ SOLN
INTRAMUSCULAR | Status: DC | PRN
Start: 2024-04-07 — End: 2024-04-07
  Administered 2024-04-07: 4 mg via INTRAVENOUS

## 2024-04-07 MED ORDER — CHLORHEXIDINE GLUCONATE 0.12 % MT SOLN
OROMUCOSAL | Status: AC
  Filled 2024-04-07: qty 15

## 2024-04-07 MED ORDER — SODIUM CHLORIDE 0.9 % IR SOLN
Status: DC | PRN
  Administered 2024-04-07: 3000 mL

## 2024-04-07 MED ORDER — GLYCOPYRROLATE PF 0.2 MG/ML IJ SOSY
PREFILLED_SYRINGE | INTRAMUSCULAR | Status: DC | PRN
  Administered 2024-04-07: .2 mg via INTRAVENOUS

## 2024-04-07 MED ORDER — FENTANYL CITRATE (PF) 250 MCG/5ML IJ SOLN
INTRAMUSCULAR | Status: DC | PRN
  Administered 2024-04-07 (×5): 50 ug via INTRAVENOUS

## 2024-04-07 MED ORDER — ACETAMINOPHEN 10 MG/ML IV SOLN: 1000.0000 mg | Freq: Once | INTRAVENOUS | Status: DC | PRN

## 2024-04-07 SURGICAL SUPPLY — 17 items
BAG DRAIN URO-CYSTO SKYTR STRL (DRAIN) ×1 IMPLANT
BASKET ZERO TIP NITINOL 2.4FR (BASKET) IMPLANT
CATH URETL OPEN 5X70 (CATHETERS) ×1 IMPLANT
EXTRACTOR STONE 1.7FRX115CM (UROLOGICAL SUPPLIES) IMPLANT
FIBER LASER FLEXIVA 365 (UROLOGICAL SUPPLIES) IMPLANT
FIBER LASER TRACTIP 200 (UROLOGICAL SUPPLIES) IMPLANT
GLOVE BIO SURGEON STRL SZ8 (GLOVE) ×1 IMPLANT
GOWN STRL SURGICAL XL XLNG (GOWN DISPOSABLE) ×1 IMPLANT
GUIDEWIRE ANG ZIPWIRE 038X150 (WIRE) IMPLANT
GUIDEWIRE STR DUAL SENSOR (WIRE) ×1 IMPLANT
KIT TURNOVER KIT B (KITS) IMPLANT
MANIFOLD NEPTUNE II (INSTRUMENTS) ×1 IMPLANT
PACK CYSTO (CUSTOM PROCEDURE TRAY) ×1 IMPLANT
STENT URET 6FRX24 CONTOUR (STENTS) IMPLANT
SYR 20ML LL LF (SYRINGE) IMPLANT
TUBE CONNECTING 12X1/4 (SUCTIONS) ×1 IMPLANT
TUBING UROLOGY SET (TUBING) ×1 IMPLANT

## 2024-04-07 NOTE — Anesthesia Procedure Notes (Signed)
 Procedure Name: LMA Insertion Date/Time: 04/07/2024 10:15 AM  Performed by: Antonetta Ronnald Caldron, CRNAPre-anesthesia Checklist: Patient identified, Emergency Drugs available, Suction available and Patient being monitored Patient Re-evaluated:Patient Re-evaluated prior to induction Oxygen Delivery Method: Circle system utilized Preoxygenation: Pre-oxygenation with 100% oxygen Induction Type: IV induction LMA: LMA inserted LMA Size: 4.0 Number of attempts: 1 Placement Confirmation: positive ETCO2 and breath sounds checked- equal and bilateral Tube secured with: Tape Dental Injury: Teeth and Oropharynx as per pre-operative assessment

## 2024-04-07 NOTE — Transfer of Care (Signed)
 Immediate Anesthesia Transfer of Care Note  Patient: Jacob Gutierrez  Procedure(s) Performed: CYSTOSCOPY, WITH RETROGRADE PYELOGRAM AND URETERAL STENT INSERTION (Bilateral: Ureter)  Patient Location: PACU  Anesthesia Type:General  Level of Consciousness: drowsy  Airway & Oxygen Therapy: Patient Spontanous Breathing and Patient connected to nasal cannula oxygen  Post-op Assessment: Report given to RN and Post -op Vital signs reviewed and stable  Post vital signs: Reviewed and stable  Last Vitals:  Vitals Value Taken Time  BP 126/82 04/07/24 11:15  Temp 36.9 C 04/07/24 11:15  Pulse 104 04/07/24 11:18  Resp 15 04/07/24 11:18  SpO2 97 % 04/07/24 11:18  Vitals shown include unfiled device data.  Last Pain:  Vitals:   04/07/24 0802  TempSrc:   PainSc: 0-No pain         Complications: No notable events documented.

## 2024-04-07 NOTE — Anesthesia Preprocedure Evaluation (Signed)
 Anesthesia Evaluation  Patient identified by MRN, date of birth, ID band Patient awake    Reviewed: Allergy & Precautions, H&P , NPO status , Patient's Chart, lab work & pertinent test results  History of Anesthesia Complications Negative for: history of anesthetic complications  Airway Mallampati: II  TM Distance: >3 FB Neck ROM: Full    Dental  (+) Edentulous Upper, Edentulous Lower   Pulmonary Current Smoker and Patient abstained from smoking.   Pulmonary exam normal breath sounds clear to auscultation       Cardiovascular hypertension,  Rhythm:Regular Rate:Normal     Neuro/Psych  Headaches CVA  negative psych ROS   GI/Hepatic ,GERD  Medicated,,  Endo/Other  negative endocrine ROS    Renal/GU Renal InsufficiencyRenal disease   B/l hydronephrosis    Musculoskeletal  (+) Arthritis ,    Abdominal   Peds  Hematology  (+) Blood dyscrasia, anemia , HIV  Anesthesia Other Findings   Reproductive/Obstetrics negative OB ROS                              Anesthesia Physical Anesthesia Plan  ASA: 3  Anesthesia Plan: General   Post-op Pain Management:    Induction: Intravenous  PONV Risk Score and Plan: 2 and Ondansetron , Dexamethasone  and Midazolam   Airway Management Planned: LMA  Additional Equipment:   Intra-op Plan:   Post-operative Plan: Extubation in OR  Informed Consent: I have reviewed the patients History and Physical, chart, labs and discussed the procedure including the risks, benefits and alternatives for the proposed anesthesia with the patient or authorized representative who has indicated his/her understanding and acceptance.     Dental advisory given  Plan Discussed with: CRNA  Anesthesia Plan Comments:         Anesthesia Quick Evaluation

## 2024-04-07 NOTE — Progress Notes (Signed)
 PROGRESS NOTE    Jacob Gutierrez  FMW:[id number] DOB: 1986-06-02 DOA: 04/05/2024 PCP: Freddrick, No   Brief Narrative:  HPI: Jacob Gutierrez is a 38 y.o. male with medical history significant of HIV/AIDS, hx HIV, GERD, CVA, CKD, chronic prostatic abscess, prior rt ureteral stenting, bilat hydronephrosis, left ureteral obstruction; s/p left NU cath 4/18 who presents with left flank pain and urinary symptoms.  Patient states for the last 2 weeks he has been dribbling out of his penis like urinating when he does not mean to.  He has been wearing a diaper actually.  He does have a history of a nephrostomy tube on the left at some point.  He states that he had a fever yesterday to 17 F.  Denies any abdominal pain nausea, vomiting, diarrhea, hematochezia.  Denies flulike symptoms, shortness of breath, cough.  Denies any hematuria.   Per chart review patient was admitted to urology from 11/07/2023 to 11/15/2023. Per DC summary from that admission: Previously seen for large prostatic fluid collection/abscess extending into the bladder.  Due to the complicated nature of his case he was referred to Dr. Levonia of [facility] who examined his information and felt that his prostate abscess could be removed conventionally, directing him back to our practice.  He is HIV positive and has been noncompliant with previous PrEP for some time.  This is somewhat better though appears to be an ongoing issue.  He was taken back to the operating room by Dr. Watt on 11/07/2023 to address his prostatic abscess and associated bilateral hydronephrosis.  The prostate was able to be unroofed transurethrally.  The side was sucessfully stented, but the left could not be safely accessed. Pt ultimately received a percutaneous nephrostomy tube on this side.  He underwent a capping trial while in the hospital but reported severe pain when urinating and asked that it be placed back to gravity drainage.  He has follow-up with interventional  radiology where they will manage tube exchanges and consideration for antegrade stent placement in the interim.  Infectious disease is following him and provided him with 2 weeks of samples of his antibiotic as well as scheduled follow-up.      ED Course: Upon arrival to ED, patient was hypertensive and tachycardic but no fever, hypoxia or tachypnea. No leukocytosis.  Mild AKI on BMP.  Lipase normal.  CT abdomen pelvis shows following findings.  Patient given Zosyn  after speaking to me.  Urology has been consulted and will see in consultation.  Hospitalist consulted for admission.  Assessment & Plan:   Principal Problem:   Complicated UTI (urinary tract infection)  Complicated UTI in a patient with chronic prostate abscess and history of bilateral hydronephrosis and bilateral ureteral stenting: UA consistent with UTI.  CT does not show any new prostate abscess.  However shows inflammation affecting renal collecting system and ureters but no pyelonephritis per radiology report.  Continue Zosyn .  Seen by urology, plan for bilateral retrograde pyelogram/stent exchange.   AKI: Baseline creatinine around 1-1.3, now presented with 2.24.  Likely in the setting of infection and possibly obstruction, creatinine worse today to 2.57 despite of hydration.  Hopeful that this will improve after exchange of the stents.   Mild hyponatremia: 132 but he appears to be having this intermittent problem.  He is asymptomatic.  Improved to 133 today.   GERD: Resume PPI.   Anemia of chronic disease: Hemoglobin at baseline around 10, dropped today to 8.3.  Will check iron studies, B12, folate  as well as FOBT.  No history of hematochezia, hematemesis or melena.  DVT prophylaxis: enoxaparin  (LOVENOX ) injection 40 mg Start: 04/07/24 0900   Code Status: Full Code  Family Communication:  None present at bedside.  Plan of care discussed with patient in length and he/she verbalized understanding and agreed with it.  Status  is: Inpatient Remains inpatient appropriate because: Scheduled for urological procedure.   Estimated body mass index is 24.81 kg/m as calculated from the following:   Height as of this encounter: 6' 1 (1.854 m).   Weight as of this encounter: 85.3 kg.    Nutritional Assessment: Body mass index is 24.81 kg/m.SABRA Seen by dietician.  I agree with the assessment and plan as outlined below: Nutrition Status:        . Skin Assessment: I have examined the patient's skin and I agree with the wound assessment as performed by the wound care RN as outlined below:    Consultants:  Urology  Procedures:  As above  Antimicrobials:  Anti-infectives (From admission, onward)    Start     Dose/Rate Route Frequency Ordered Stop   04/06/24 1000  dapsone  tablet 100 mg        100 mg Oral Daily 04/06/24 0748     04/06/24 0800  Darunavir -Cobicistat-Emtricitabine -Tenofovir  Alafenamide (SYMTUZA ) 800-150-200-10 MG TABS 1 tablet        1 tablet Oral Daily with breakfast 04/06/24 0748     04/06/24 0800  piperacillin -tazobactam (ZOSYN ) IVPB 3.375 g        3.375 g 12.5 mL/hr over 240 Minutes Intravenous Every 8 hours 04/06/24 0757           Subjective: Seen and examined, no new complaint today.  Objective: Vitals:   04/06/24 1703 04/06/24 1706 04/06/24 1952 04/07/24 0300  BP: 115/76  125/77 126/72  Pulse: 87  79 98  Resp: 17   16  Temp: 98.4 F (36.9 C)  98.2 F (36.8 C) 97.9 F (36.6 C)  TempSrc: Oral  Oral Oral  SpO2: 100%  98% 98%  Weight:  85.3 kg    Height:  6' 1 (1.854 m)      Intake/Output Summary (Last 24 hours) at 04/07/2024 0745 Last data filed at 04/06/2024 1716 Gross per 24 hour  Intake 1219.63 ml  Output --  Net 1219.63 ml   Filed Weights   04/06/24 0735 04/06/24 1706  Weight: 85.2 kg 85.3 kg    Examination:  General exam: Appears calm and comfortable  Respiratory system: Clear to auscultation. Respiratory effort normal. Cardiovascular system: S1 & S2  heard, RRR. No JVD, murmurs, rubs, gallops or clicks. No pedal edema. Gastrointestinal system: Abdomen is nondistended, soft and nontender. No organomegaly or masses felt. Normal bowel sounds heard. Central nervous system: Alert and oriented. No focal neurological deficits. Extremities: Symmetric 5 x 5 power. Skin: No rashes, lesions or ulcers Psychiatry: Judgement and insight appear normal. Mood & affect appropriate.    Data Reviewed: I have personally reviewed following labs and imaging studies  CBC: Recent Labs  Lab 04/06/24 0500 04/07/24 0433  WBC 7.1 5.4  NEUTROABS 3.7  --   HGB 9.8* 8.3*  HCT 30.9* 26.2*  MCV 101.0* 100.0  PLT 341 277   Basic Metabolic Panel: Recent Labs  Lab 04/06/24 0500 04/07/24 0433  NA 132* 133*  K 4.0 3.8  CL 101 107  CO2 20* 20*  GLUCOSE 88 115*  BUN 16 19  CREATININE 2.24* 2.57*  CALCIUM  8.6* 7.8*  GFR: Estimated Creatinine Clearance: 43.2 mL/min (A) (by C-G formula based on SCr of 2.57 mg/dL (H)). Liver Function Tests: Recent Labs  Lab 04/06/24 0500 04/07/24 0433  AST 17 14*  ALT 12 10  ALKPHOS 54 46  BILITOT 0.5 0.6  PROT 9.6* 7.9  ALBUMIN  2.7* 1.9*   Recent Labs  Lab 04/06/24 0500  LIPASE 30   No results for input(s): AMMONIA in the last 168 hours. Coagulation Profile: No results for input(s): INR, PROTIME in the last 168 hours. Cardiac Enzymes: No results for input(s): CKTOTAL, CKMB, CKMBINDEX, TROPONINI in the last 168 hours. BNP (last 3 results) No results for input(s): PROBNP in the last 8760 hours. HbA1C: No results for input(s): HGBA1C in the last 72 hours. CBG: No results for input(s): GLUCAP in the last 168 hours. Lipid Profile: No results for input(s): CHOL, HDL, LDLCALC, TRIG, CHOLHDL, LDLDIRECT in the last 72 hours. Thyroid Function Tests: No results for input(s): TSH, T4TOTAL, FREET4, T3FREE, THYROIDAB in the last 72 hours. Anemia Panel: No results for  input(s): VITAMINB12, FOLATE, FERRITIN, TIBC, IRON, RETICCTPCT in the last 72 hours. Sepsis Labs: No results for input(s): PROCALCITON, LATICACIDVEN in the last 168 hours.  No results found for this or any previous visit (from the past 240 hours).   Radiology Studies: CT ABDOMEN PELVIS W CONTRAST Result Date: 04/06/2024 CLINICAL DATA:  38 year old male with abdominal pain. History of nephrostomy tubes, ureteral stents. Prostatic abscess. EXAM: CT ABDOMEN AND PELVIS WITH CONTRAST TECHNIQUE: Multidetector CT imaging of the abdomen and pelvis was performed using the standard protocol following bolus administration of intravenous contrast. RADIATION DOSE REDUCTION: This exam was performed according to the departmental dose-optimization program which includes automated exposure control, adjustment of the mA and/or kV according to patient size and/or use of iterative reconstruction technique. CONTRAST:  60mL OMNIPAQUE  IOHEXOL  350 MG/ML SOLN COMPARISON:  Noncontrast CT Abdomen and Pelvis 07/24/2023. Report of nephrostomy tube exchange 12/28/2023. FINDINGS: Lower chest: Negative. Hepatobiliary: Negative liver. Gallbladder mildly distended but otherwise within normal limits. No biliary ductal dilatation. Pancreas: Negative. Spleen: Negative. Adrenals/Urinary Tract: Normal adrenal glands. Highly abnormal bilateral kidneys and ureters. Bilateral hydroureteronephrosis with severe urothelial soft tissue thickening and inflammation (right ureter coronal image 64, right renal pelvis coronal image 59, proximal left ureter coronal image 51). And this despite the presence of bilateral double-J ureteral stents in place. The right stent proximal pigtail is in the lower pole of the right renal collecting system. The left proximal pigtail is in the renal pelvis. Both stents terminates in the urinary bladder. Severe right urothelial thickening continues distally, mild to moderate on the left (series 3, image 54).  Similar bladder wall thickening, although the bladder is decompressed. Punctate right renal lower pole collecting system stone. No other discrete urinary calculus. Renal parenchymal enhancement seems relatively maintained, no obvious striated nephrogram despite the severe urothelial inflammation. Stomach/Bowel: Nondilated large and small bowel loops. Low-density retained stool in much of the colon. No large bowel wall thickening is identified. Appendix is not delineated. Decompressed stomach. No pneumoperitoneum or free fluid identified. Vascular/Lymphatic: Suboptimal contrast bolus. Grossly patent major vascular structures. No atherosclerosis identified. Retroperitoneal lymphadenopathy with individual nodes up to 13 mm short axis (series 3, images 35-48) does not appear significantly changed from last year. No cystic or necrotic nodes. Reproductive: Native prostate gland seems diminutive. Dilated, lobulated prostatic urethra suspected on series 3, images 83 and 84, and similar appearance of the posterior E through on series 3, image 90. These findings are stable to  improved since 07/24/2023. No obvious regional inflammation. Other: No pelvis free fluid. Musculoskeletal: No acute or suspicious osseous lesion identified. IMPRESSION: 1. Tremendous / very Severe Right > Left Urothelial Thickening and Inflammation affecting the renal collecting systems and ureters, with bilateral hydroureteronephrosis. This despite well-positioned bilateral double-J ureteral stents. Less pronounced bladder wall thickening. No convincing pyelonephritis. Minimal nephrolithiasis. Chronic retroperitoneal lymphadenopathy, stable and likely reactive. 2. Seemingly diminutive prostate. Chronically dilated prostatic urethra / posterior urethra appears stable to improved since 07/24/2023 CT. 3. Recommend Urology consultation. Electronically Signed   By: VEAR Hurst M.D.   On: 04/06/2024 06:56    Scheduled Meds:  dapsone   100 mg Oral Daily    Darunavir -Cobicistat-Emtricitabine -Tenofovir  Alafenamide  1 tablet Oral Q breakfast   enoxaparin  (LOVENOX ) injection  40 mg Subcutaneous Q24H   pantoprazole   40 mg Oral Daily   Continuous Infusions:  sodium chloride  125 mL/hr at 04/07/24 0559   piperacillin -tazobactam (ZOSYN )  IV 3.375 g (04/07/24 0601)     LOS: 1 day   Fredia Skeeter, MD [facility]  04/07/2024, 7:45 AM   *Please note that this is a verbal dictation therefore any spelling or grammatical errors are due to the Dragon Medical One system interpretation.  Please page via Amion and do not message via secure chat for urgent patient care matters. Secure chat can be used for non urgent patient care matters.  How to contact the [facility] Attending or Consulting provider 7A - 7P or covering provider during after hours 7P -7A, for this patient?  Check the care team in [facility] and look for a) attending/consulting [facility] provider listed and b) the [facility] team listed. Page or secure chat 7A-7P. Log into [email or web address] and use [place]'s universal password to access. If you do not have the password, please contact the hospital operator. Locate the [facility] provider you are looking for under [facility] and page to a number that you can be directly reached. If you still have difficulty reaching the provider, please page the [facility] (Director on Call) for the Hospitalists listed on amion for assistance.

## 2024-04-07 NOTE — Interval H&P Note (Signed)
 History and Physical Interval Note:  04/07/2024 8:55 AM  Jacob Gutierrez  has presented today for surgery, with the diagnosis of urinary issues.  The various methods of treatment have been discussed with the patient and family. After consideration of risks, benefits and other options for treatment, the patient has consented to  Procedure(s): CYSTOSCOPY, WITH RETROGRADE PYELOGRAM AND URETERAL STENT INSERTION (Bilateral) as a surgical intervention.  The patient's history has been reviewed, patient examined, no change in status, stable for surgery.  I have reviewed the patient's chart and labs.  Questions were answered to the patient's satisfaction.    CV:RRR Lungs:clear  Jacob Gutierrez

## 2024-04-07 NOTE — Anesthesia Postprocedure Evaluation (Signed)
 Anesthesia Post Note  Patient: Jacob Gutierrez  Procedure(s) Performed: CYSTOSCOPY, WITH RETROGRADE PYELOGRAM AND URETERAL STENT INSERTION (Bilateral: Ureter)     Patient location during evaluation: PACU Anesthesia Type: General Level of consciousness: awake and alert Pain management: pain level controlled Vital Signs Assessment: post-procedure vital signs reviewed and stable Respiratory status: spontaneous breathing, nonlabored ventilation, respiratory function stable and patient connected to nasal cannula oxygen Cardiovascular status: blood pressure returned to baseline and stable Postop Assessment: no apparent nausea or vomiting Anesthetic complications: no   No notable events documented.  Last Vitals:  Vitals:   04/07/24 1145 04/07/24 1429  BP: (!) 121/95 117/69  Pulse: 95 96  Resp: 13 16  Temp: 36.5 C 36.4 C  SpO2: 95% 100%    Last Pain:  Vitals:   04/07/24 1429  TempSrc: Oral  PainSc:                  Thom JONELLE Peoples

## 2024-04-07 NOTE — Plan of Care (Signed)

## 2024-04-07 NOTE — Op Note (Signed)
 Preoperative diagnosis:  Bilateral hydronephrosis Bilateral indwelling ureteral stents Recurrent UTI History bilateral distal ureteral strictures  Postoperative diagnosis:  Same  Procedure: Cystoscopy Bilateral ureteral stent exchange Bilateral retrograde pyelogram  Surgeon: Glendia JAYSON Barba, MD  Anesthesia: General  Complications: None  Intraoperative findings:  Cystoscopy: Urethra normal in caliber without stricture.  Widely patent prostatic fossa.  Bilateral ureteral stents.  Inflammatory changes trigone secondary to indwelling stents  EBL: Minimal  Specimens: None  Indication: Jacob Gutierrez is a 38 y.o. patient with a complicated urologic history including recurrent prostatic abscess and bilateral ureteral strictures.  Refer to consult note for details after reviewing the management options for treatment, he elected to proceed with the above surgical procedure(s). We have discussed the potential benefits and risks of the procedure, side effects of the proposed treatment, the likelihood of the patient achieving the goals of the procedure, and any potential problems that might occur during the procedure or recuperation. Informed consent has been obtained.  Description of procedure:  The patient was taken to the operating room and general anesthesia was induced.  The patient was placed in the dorsal lithotomy, prepped and draped in the usual sterile fashion, and preoperative antibiotics were administered. A preoperative time-out was performed.   A 34F cystoscope with 30 degree lens was lubricated, placed per urethra and advanced proximally under direct vision with findings as described above.  Attention was directed to the right UO and a 0.038 Sensor wire was placed through the working channel of the cystoscope and into the right ureter alongside the stent and advanced to the region of the renal pelvis under fluoroscopy without difficulty.  The cystoscope was removed and  then repassed.  The right ureteral stent was grasped with endoscopic forceps and removed.  A 64F open ended ureteral catheter was then advanced over the guidewire into the region of the renal pelvis.  The guidewire was removed and retrograde pyelogram was performed confirming correct position.  Mild hydronephrosis was noted.  The guidewire was replaced and ureteral catheter was removed.  A 28F/24 cm Contour ureteral stent was then placed over the guidewire on fluoroscopic guidance.  There was good position of the proximal curl in the renal pelvis and distal in in the bladder.  Attention was directed left UO and the Sensor wire was placed alongside the indwelling stent and advanced without difficulty into the region of the renal pelvis.  The left radial stent was grasped with in scopic forceps and withdrawn while performing Spot imaging of the right ureteral stent.  Mild distal migration of the contralateral stent was noted to the UPJ with some compression of the curl.  The 5 [place] open-ended ureteral catheter was placed over the guidewire and left retrograde pyelogram was performed confirming correct position.  Moderate hydronephrosis was noted.  The guidewire was replaced and the ureteral catheter was removed.  A 28F/24 cm Contour ureteral stent was advanced over the guidewire in a similar fashion.  Good positioning was noted in the renal pelvis under fluoroscopy and bladder under direct vision.  It was elected to reposition the right ureteral stent with the mild distal migration.  The stent was grasped with endoscopic forceps and brought out through the urethral meatus.  The Sensor wire was placed in the stent and advanced into the renal pelvis.  On fluoroscopic guidance the stent was advanced with 8 of a pusher.  The proximal portion was noted in a dilated superior pole calyx.  The distal end of the stent  was well-positioned in the bladder.  The bladder was emptied and cystoscope was removed.  After  anesthetic reversal he was transported to the PACU in stable condition  Plan: Is admitted to the hospitalist service for treatment of his UTI Once treatment complete consider stent removal with bilateral retrograde pyelograms in the near future as it would be ideal if he could be stent free based on his history of noncompliance   Glendia JAYSON Barba, M.D.

## 2024-04-08 ENCOUNTER — Encounter (HOSPITAL_COMMUNITY): Payer: Self-pay | Admitting: Urology

## 2024-04-08 DIAGNOSIS — N390 Urinary tract infection, site not specified: Secondary | ICD-10-CM | POA: Diagnosis not present

## 2024-04-08 LAB — CBC WITH DIFFERENTIAL/PLATELET
Basophils Absolute: 0 K/uL (ref 0.0–0.1)
Basophils Relative: 0 %
Eosinophils Absolute: 0 K/uL (ref 0.0–0.5)
Eosinophils Relative: 0 %
HCT: 25.1 % — ABNORMAL LOW (ref 39.0–52.0)
Hemoglobin: 8 g/dL — ABNORMAL LOW (ref 13.0–17.0)
Lymphocytes Relative: 6 %
Lymphs Abs: 0.3 K/uL — ABNORMAL LOW (ref 0.7–4.0)
MCH: 31.9 pg (ref 26.0–34.0)
MCHC: 31.9 g/dL (ref 30.0–36.0)
MCV: 100 fL (ref 80.0–100.0)
Monocytes Absolute: 0.3 K/uL (ref 0.1–1.0)
Monocytes Relative: 6 %
Neutro Abs: 4.7 K/uL (ref 1.7–7.7)
Neutrophils Relative %: 88 %
Platelets: 299 K/uL (ref 150–400)
RBC: 2.51 MIL/uL — ABNORMAL LOW (ref 4.22–5.81)
RDW: 15.1 % (ref 11.5–15.5)
WBC: 5.3 K/uL (ref 4.0–10.5)
nRBC: 0 % (ref 0.0–0.2)

## 2024-04-08 LAB — BASIC METABOLIC PANEL WITH GFR
Anion gap: 9 (ref 5–15)
BUN: 18 mg/dL (ref 6–20)
CO2: 22 mmol/L (ref 22–32)
Calcium: 8.2 mg/dL — ABNORMAL LOW (ref 8.9–10.3)
Chloride: 105 mmol/L (ref 98–111)
Creatinine, Ser: 2.38 mg/dL — ABNORMAL HIGH (ref 0.61–1.24)
GFR, Estimated: 34 mL/min — ABNORMAL LOW (ref >60)
Glucose, Bld: 149 mg/dL — ABNORMAL HIGH (ref 70–99)
Potassium: 3.7 mmol/L (ref 3.5–5.1)
Sodium: 136 mmol/L (ref 135–145)

## 2024-04-08 LAB — URINE CULTURE: Culture: 40000 — AB

## 2024-04-08 MED ORDER — SODIUM CHLORIDE 0.9 % IV SOLN
2.0000 g | INTRAVENOUS | Status: DC
  Administered 2024-04-08 – 2024-04-11 (×4): 2 g via INTRAVENOUS
  Filled 2024-04-08 (×4): qty 20

## 2024-04-08 MED ORDER — SODIUM CHLORIDE 0.9 % IV SOLN: INTRAVENOUS | Status: AC

## 2024-04-08 NOTE — Progress Notes (Signed)
 PROGRESS NOTE    Jacob Gutierrez  FMW:[id number] DOB: 12/20/1985 DOA: 04/05/2024 PCP: Freddrick, No   Brief Narrative:  HPI: Jacob Gutierrez is a 38 y.o. male with medical history significant of HIV/AIDS, hx HIV, GERD, CVA, CKD, chronic prostatic abscess, prior rt ureteral stenting, bilat hydronephrosis, left ureteral obstruction; s/p left NU cath 4/18 who presents with left flank pain and urinary symptoms.  Patient states for the last 2 weeks he has been dribbling out of his penis like urinating when he does not mean to.  He has been wearing a diaper actually.  He does have a history of a nephrostomy tube on the left at some point.  He states that he had a fever yesterday to 5 F.  Denies any abdominal pain nausea, vomiting, diarrhea, hematochezia.  Denies flulike symptoms, shortness of breath, cough.  Denies any hematuria.   Per chart review patient was admitted to urology from 11/07/2023 to 11/15/2023. Per DC summary from that admission: Previously seen for large prostatic fluid collection/abscess extending into the bladder.  Due to the complicated nature of his case he was referred to Dr. Levonia of [facility] who examined his information and felt that his prostate abscess could be removed conventionally, directing him back to our practice.  He is HIV positive and has been noncompliant with previous PrEP for some time.  This is somewhat better though appears to be an ongoing issue.  He was taken back to the operating room by Dr. Watt on 11/07/2023 to address his prostatic abscess and associated bilateral hydronephrosis.  The prostate was able to be unroofed transurethrally.  The side was sucessfully stented, but the left could not be safely accessed. Pt ultimately received a percutaneous nephrostomy tube on this side.  He underwent a capping trial while in the hospital but reported severe pain when urinating and asked that it be placed back to gravity drainage.  He has follow-up with interventional  radiology where they will manage tube exchanges and consideration for antegrade stent placement in the interim.  Infectious disease is following him and provided him with 2 weeks of samples of his antibiotic as well as scheduled follow-up.      ED Course: Upon arrival to ED, patient was hypertensive and tachycardic but no fever, hypoxia or tachypnea. No leukocytosis.  Mild AKI on BMP.  Lipase normal.  CT abdomen pelvis shows following findings.  Patient given Zosyn  after speaking to me.  Urology has been consulted and will see in consultation.  Hospitalist consulted for admission.  Assessment & Plan:   Principal Problem:   Complicated UTI (urinary tract infection)  Complicated UTI in a patient with chronic prostate abscess and history of bilateral hydronephrosis and bilateral ureteral stenting: UA consistent with UTI.  CT does not show any new prostate abscess.  However shows inflammation affecting renal collecting system and ureters but no pyelonephritis per radiology report.  Urine cultures growing 40,000 colonies of Klebsiella pneumoniae and 80,000 colonies of Proteus mirabilis.  Continue Zosyn  and follow final sensitivities.  Seen by urology, status post bilateral retrograde pyelogram/stent exchange 04/07/2024.   AKI: Baseline creatinine around 1-1.3, now presented with 2.24.  Likely in the setting of infection and possibly obstruction, creatinine peaked at 2.57 despite of hydration however since the procedure, his creatinine has improved but only a little and down to 2.38 with brisk urine output, hopefully this will further improve tomorrow, will aggressively hydrate him as well today.   Mild hyponatremia: Resolved.   GERD: Resume  PPI.   Anemia of chronic disease: Hemoglobin at baseline around 10, dropped today to 8.3.  Will check iron studies, B12, folate as well as FOBT.  No history of hematochezia, hematemesis or melena.  DVT prophylaxis: enoxaparin  (LOVENOX ) injection 40 mg Start:  04/07/24 0900   Code Status: Full Code  Family Communication:  None present at bedside.  Plan of care discussed with patient in length and he/she verbalized understanding and agreed with it.  Status is: Inpatient Remains inpatient appropriate because: Still elevated creatinine, needs IV fluids.   Estimated body mass index is 24.81 kg/m as calculated from the following:   Height as of this encounter: 6' 1 (1.854 m).   Weight as of this encounter: 85.3 kg.    Nutritional Assessment: Body mass index is 24.81 kg/m.Jacob Gutierrez Seen by dietician.  I agree with the assessment and plan as outlined below: Nutrition Status:        . Skin Assessment: I have examined the patient's skin and I agree with the wound assessment as performed by the wound care RN as outlined below:    Consultants:  Urology  Procedures:  As above  Antimicrobials:  Anti-infectives (From admission, onward)    Start     Dose/Rate Route Frequency Ordered Stop   04/06/24 1000  dapsone  tablet 100 mg        100 mg Oral Daily 04/06/24 0748     04/06/24 0800  Darunavir -Cobicistat-Emtricitabine -Tenofovir  Alafenamide (SYMTUZA ) 800-150-200-10 MG TABS 1 tablet        1 tablet Oral Daily with breakfast 04/06/24 0748     04/06/24 0800  piperacillin -tazobactam (ZOSYN ) IVPB 3.375 g        3.375 g 12.5 mL/hr over 240 Minutes Intravenous Every 8 hours 04/06/24 0757           Subjective: Patient seen and examined, he has no complaints.  He is feeling better since he had the stent exchange.  He says that his penile discharge/urinary dribbling has stopped as well.  Objective: Vitals:   04/07/24 1429 04/07/24 2034 04/08/24 0441 04/08/24 0729  BP: 117/69 (!) 140/83 117/77 124/80  Pulse: 96 93 69 64  Resp: 16 16 16 17   Temp: 97.6 F (36.4 C) 97.8 F (36.6 C)  (!) 97.5 F (36.4 C)  TempSrc: Oral Oral  Oral  SpO2: 100% 99% 96% 98%  Weight:      Height:        Intake/Output Summary (Last 24 hours) at 04/08/2024 0835 Last  data filed at 04/08/2024 0452 Gross per 24 hour  Intake 1000 ml  Output 3875 ml  Net -2875 ml   Filed Weights   04/06/24 0735 04/06/24 1706 04/07/24 0822  Weight: 85.2 kg 85.3 kg 85.3 kg    Examination:  General exam: Appears calm and comfortable  Respiratory system: Clear to auscultation. Respiratory effort normal. Cardiovascular system: S1 & S2 heard, RRR. No JVD, murmurs, rubs, gallops or clicks. No pedal edema. Gastrointestinal system: Abdomen is nondistended, soft and nontender. No organomegaly or masses felt. Normal bowel sounds heard. Central nervous system: Alert and oriented. No focal neurological deficits. Extremities: Symmetric 5 x 5 power. Skin: No rashes, lesions or ulcers.  Psychiatry: Judgement and insight appear normal. Mood & affect appropriate.   Data Reviewed: I have personally reviewed following labs and imaging studies  CBC: Recent Labs  Lab 04/06/24 0500 04/07/24 0433 04/08/24 0500  WBC 7.1 5.4 5.3  NEUTROABS 3.7  --  4.7  HGB 9.8* 8.3* 8.0*  HCT  30.9* 26.2* 25.1*  MCV 101.0* 100.0 100.0  PLT 341 277 299   Basic Metabolic Panel: Recent Labs  Lab 04/06/24 0500 04/07/24 0433 04/08/24 0500  NA 132* 133* 136  K 4.0 3.8 3.7  CL 101 107 105  CO2 20* 20* 22  GLUCOSE 88 115* 149*  BUN 16 19 18   CREATININE 2.24* 2.57* 2.38*  CALCIUM  8.6* 7.8* 8.2*   GFR: Estimated Creatinine Clearance: 46.6 mL/min (A) (by C-G formula based on SCr of 2.38 mg/dL (H)). Liver Function Tests: Recent Labs  Lab 04/06/24 0500 04/07/24 0433  AST 17 14*  ALT 12 10  ALKPHOS 54 46  BILITOT 0.5 0.6  PROT 9.6* 7.9  ALBUMIN  2.7* 1.9*   Recent Labs  Lab 04/06/24 0500  LIPASE 30   No results for input(s): AMMONIA in the last 168 hours. Coagulation Profile: No results for input(s): INR, PROTIME in the last 168 hours. Cardiac Enzymes: No results for input(s): CKTOTAL, CKMB, CKMBINDEX, TROPONINI in the last 168 hours. BNP (last 3 results) No results  for input(s): PROBNP in the last 8760 hours. HbA1C: No results for input(s): HGBA1C in the last 72 hours. CBG: No results for input(s): GLUCAP in the last 168 hours. Lipid Profile: No results for input(s): CHOL, HDL, LDLCALC, TRIG, CHOLHDL, LDLDIRECT in the last 72 hours. Thyroid Function Tests: No results for input(s): TSH, T4TOTAL, FREET4, T3FREE, THYROIDAB in the last 72 hours. Anemia Panel: Recent Labs    04/07/24 0432 04/07/24 1347  VITAMINB12  --  747  FOLATE 10.2  --   FERRITIN  --  161  TIBC  --  189*  IRON  --  35*  RETICCTPCT 2.3  --    Sepsis Labs: No results for input(s): PROCALCITON, LATICACIDVEN in the last 168 hours.  Recent Results (from the past 240 hours)  Urine Culture     Status: Abnormal (Preliminary result)   Collection Time: 04/06/24  6:19 AM   Specimen: Urine, Clean Catch  Result Value Ref Range Status   Specimen Description URINE, CLEAN CATCH  Final   Special Requests NONE  Final   Culture (A)  Final    40,000 COLONIES/mL KLEBSIELLA PNEUMONIAE 80,000 COLONIES/mL PROTEUS MIRABILIS SUSCEPTIBILITIES TO FOLLOW Performed at [facility], [address], [place], [place] [address]    Report Status PENDING  Incomplete     Radiology Studies: DG C-Arm 1-60 Min-No Report Result Date: 04/07/2024 Fluoroscopy was utilized by the requesting physician.  No radiographic interpretation.    Scheduled Meds:  dapsone   100 mg Oral Daily   Darunavir -Cobicistat-Emtricitabine -Tenofovir  Alafenamide  1 tablet Oral Q breakfast   enoxaparin  (LOVENOX ) injection  40 mg Subcutaneous Q24H   pantoprazole   40 mg Oral Daily   tamsulosin   0.4 mg Oral Daily   Continuous Infusions:  sodium chloride      piperacillin -tazobactam (ZOSYN )  IV 3.375 g (04/08/24 0545)     LOS: 2 days   Fredia Skeeter, MD [facility]  04/08/2024, 8:35 AM   *Please note that this is a verbal dictation therefore any spelling or grammatical errors are  due to the Dragon Medical One system interpretation.  Please page via Amion and do not message via secure chat for urgent patient care matters. Secure chat can be used for non urgent patient care matters.  How to contact the [facility] Attending or Consulting provider 7A - 7P or covering provider during after hours 7P -7A, for this patient?  Check the care team in [facility] and look for  a) attending/consulting [facility] provider listed and b) the [facility] team listed. Page or secure chat 7A-7P. Log into [email or web address] and use [place]'s universal password to access. If you do not have the password, please contact the hospital operator. Locate the [facility] provider you are looking for under [facility] and page to a number that you can be directly reached. If you still have difficulty reaching the provider, please page the [facility] (Director on Call) for the Hospitalists listed on amion for assistance.

## 2024-04-08 NOTE — Plan of Care (Signed)

## 2024-04-09 DIAGNOSIS — N390 Urinary tract infection, site not specified: Secondary | ICD-10-CM | POA: Diagnosis not present

## 2024-04-09 LAB — BASIC METABOLIC PANEL WITH GFR
Anion gap: 9 (ref 5–15)
BUN: 17 mg/dL (ref 6–20)
CO2: 17 mmol/L — ABNORMAL LOW (ref 22–32)
Calcium: 7.7 mg/dL — ABNORMAL LOW (ref 8.9–10.3)
Chloride: 109 mmol/L (ref 98–111)
Creatinine, Ser: 2.51 mg/dL — ABNORMAL HIGH (ref 0.61–1.24)
GFR, Estimated: 32 mL/min — ABNORMAL LOW (ref >60)
Glucose, Bld: 110 mg/dL — ABNORMAL HIGH (ref 70–99)
Potassium: 4 mmol/L (ref 3.5–5.1)
Sodium: 135 mmol/L (ref 135–145)

## 2024-04-09 LAB — CBC WITH DIFFERENTIAL/PLATELET
Band Neutrophils: 9 %
Basophils Absolute: 0 K/uL (ref 0.0–0.1)
Basophils Relative: 0 %
Eosinophils Absolute: 0.3 K/uL (ref 0.0–0.5)
Eosinophils Relative: 2 %
HCT: 24.7 % — ABNORMAL LOW (ref 39.0–52.0)
Hemoglobin: 7.9 g/dL — ABNORMAL LOW (ref 13.0–17.0)
Lymphocytes Relative: 5 %
Lymphs Abs: 0.7 K/uL (ref 0.7–4.0)
MCH: 32.1 pg (ref 26.0–34.0)
MCHC: 32 g/dL (ref 30.0–36.0)
MCV: 100.4 fL — ABNORMAL HIGH (ref 80.0–100.0)
Monocytes Absolute: 0.4 K/uL (ref 0.1–1.0)
Monocytes Relative: 3 %
Neutro Abs: 12.2 K/uL — ABNORMAL HIGH (ref 1.7–7.7)
Neutrophils Relative %: 81 %
Platelets: 307 K/uL (ref 150–400)
RBC: 2.46 MIL/uL — ABNORMAL LOW (ref 4.22–5.81)
RDW: 15.3 % (ref 11.5–15.5)
Smear Review: NORMAL
WBC: 13.6 K/uL — ABNORMAL HIGH (ref 4.0–10.5)
nRBC: 0 % (ref 0.0–0.2)

## 2024-04-09 MED ORDER — SODIUM CHLORIDE 0.9 % IV SOLN: INTRAVENOUS | Status: AC

## 2024-04-09 NOTE — Progress Notes (Signed)
   2 Days Post-Op Subjective: No acute events overnight.  Patient having lunch in bed on rounds.  Reviewed case and plan and all questions were answered to satisfaction.  Objective: Vital signs in last 24 hours: Temp:  [98 F (36.7 C)-98.5 F (36.9 C)] 98.3 F (36.8 C) (09/16 0800) Pulse Rate:  [83-114] 89 (09/16 0800) Resp:  [15-20] 15 (09/16 0400) BP: (105-129)/(69-79) 126/79 (09/16 0800) SpO2:  [95 %-97 %] 97 % (09/16 0800)  Assessment/Plan: # Bilateral hydronephrosis/hydroureter # AKI  Exchange/Reposition ureteral stents with Dr. Twylla on 04/07/2024 Chronic ESBL UTI.  Tailor to sensitivities Serum creatinine was normal range in April as of this year.  He has shown no real improvement in renal function, with serum creatinine hovering around the 2.50 range before and after stent.  UOP is acceptable.  Primary team was concerned that this was obstructive in nature.  Bilateral retrograde pyelograms on 9/14. Stents are in place and draining. Will order lasix  renogram to confirm patency. Would benefit from Nephrology input  Intake/Output from previous day: 09/15 0701 - 09/16 0700 In: 1250 [P.O.:240; I.V.:910; IV Piggyback:100] Out: 1100 [Urine:1100]  Intake/Output this shift: Total I/O In: 620 [P.O.:620] Out: -   Physical Exam:  General: Alert and oriented CV: No cyanosis Lungs: equal chest rise Abdomen: Soft, NTND, no rebound or guarding  Lab Results: Recent Labs    04/07/24 0433 04/08/24 0500 04/09/24 0412  HGB 8.3* 8.0* 7.9*  HCT 26.2* 25.1* 24.7*   BMET Recent Labs    04/08/24 0500 04/09/24 0412  NA 136 135  K 3.7 4.0  CL 105 109  CO2 22 17*  GLUCOSE 149* 110*  BUN 18 17  CREATININE 2.38* 2.51*  CALCIUM  8.2* 7.7*  HGB 8.0* 7.9*  WBC 5.3 13.6*     Studies/Results: No results found.    LOS: 3 days   Ole Bourdon, NP [facility] Pager: [phone]  04/09/2024, 1:28 PM

## 2024-04-09 NOTE — Plan of Care (Signed)

## 2024-04-09 NOTE — Progress Notes (Signed)
 PROGRESS NOTE    Jacob Gutierrez  FMW:[id number] DOB: 07-10-1986 DOA: 04/05/2024 PCP: Freddrick, No   Brief Narrative:  HPI: Jacob Gutierrez is a 38 y.o. male with medical history significant of HIV/AIDS, hx HIV, GERD, CVA, CKD, chronic prostatic abscess, prior rt ureteral stenting, bilat hydronephrosis, left ureteral obstruction; s/p left NU cath 4/18 who presents with left flank pain and urinary symptoms.  Patient states for the last 2 weeks he has been dribbling out of his penis like urinating when he does not mean to.  He has been wearing a diaper actually.  He does have a history of a nephrostomy tube on the left at some point.  He states that he had a fever yesterday to 54 F.  Denies any abdominal pain nausea, vomiting, diarrhea, hematochezia.  Denies flulike symptoms, shortness of breath, cough.  Denies any hematuria.   Per chart review patient was admitted to urology from 11/07/2023 to 11/15/2023. Per DC summary from that admission: Previously seen for large prostatic fluid collection/abscess extending into the bladder.  Due to the complicated nature of his case he was referred to Dr. Levonia of [facility] who examined his information and felt that his prostate abscess could be removed conventionally, directing him back to our practice.  He is HIV positive and has been noncompliant with previous PrEP for some time.  This is somewhat better though appears to be an ongoing issue.  He was taken back to the operating room by Dr. Watt on 11/07/2023 to address his prostatic abscess and associated bilateral hydronephrosis.  The prostate was able to be unroofed transurethrally.  The side was sucessfully stented, but the left could not be safely accessed. Pt ultimately received a percutaneous nephrostomy tube on this side.  He underwent a capping trial while in the hospital but reported severe pain when urinating and asked that it be placed back to gravity drainage.  He has follow-up with interventional  radiology where they will manage tube exchanges and consideration for antegrade stent placement in the interim.  Infectious disease is following him and provided him with 2 weeks of samples of his antibiotic as well as scheduled follow-up.      ED Course: Upon arrival to ED, patient was hypertensive and tachycardic but no fever, hypoxia or tachypnea. No leukocytosis.  Mild AKI on BMP.  Lipase normal.  CT abdomen pelvis shows following findings.  Patient given Zosyn  after speaking to me.  Urology has been consulted and will see in consultation.  Hospitalist consulted for admission.  Assessment & Plan:   Principal Problem:   Complicated UTI (urinary tract infection)  Complicated UTI in a patient with chronic prostate abscess and history of bilateral hydronephrosis and bilateral ureteral stenting: UA consistent with UTI.  CT does not show any new prostate abscess.  However shows inflammation affecting renal collecting system and ureters but no pyelonephritis per radiology report.  Urine cultures growing 40,000 colonies of Klebsiella pneumoniae and 80,000 colonies of Proteus mirabilis.  Zosyn  transition to Rocephin  04/08/2024 per pharmacy recommendations.  Urology on board, patient status post bilateral retrograde pyelogram/stent exchange 04/07/2024.   AKI: Baseline creatinine around 1-1.3, now presented with 2.24.  Likely in the setting of infection and possibly obstruction, creatinine peaked at 2.57 despite of hydration however after procedure, creatinine improved a little bit but back to 2.5 again today.  He has good urine output.  I am concerned about possible obstructive uropathy.  I have requested urology to see this patient again.  Will provide him with some more IV fluids today.   Mild hyponatremia: Resolved.   GERD: Resume PPI.   Anemia of chronic disease: Hemoglobin at baseline around 10, dropped today to 8.3.  Will check iron studies, B12, folate as well as FOBT.  No history of hematochezia,  hematemesis or melena.  DVT prophylaxis: enoxaparin  (LOVENOX ) injection 40 mg Start: 04/07/24 0900   Code Status: Full Code  Family Communication:  None present at bedside.  Plan of care discussed with patient in length and he/she verbalized understanding and agreed with it.  Status is: Inpatient Remains inpatient appropriate because: Still elevated creatinine, needs IV fluids.  Needs to be seen by urology   Estimated body mass index is 24.81 kg/m as calculated from the following:   Height as of this encounter: 6' 1 (1.854 m).   Weight as of this encounter: 85.3 kg.    Nutritional Assessment: Body mass index is 24.81 kg/m.SABRA Seen by dietician.  I agree with the assessment and plan as outlined below: Nutrition Status:        . Skin Assessment: I have examined the patient's skin and I agree with the wound assessment as performed by the wound care RN as outlined below:    Consultants:  Urology  Procedures:  As above  Antimicrobials:  Anti-infectives (From admission, onward)    Start     Dose/Rate Route Frequency Ordered Stop   04/08/24 1130  cefTRIAXone  (ROCEPHIN ) 2 g in sodium chloride  0.9 % 100 mL IVPB        2 g 200 mL/hr over 30 Minutes Intravenous Every 24 hours 04/08/24 1035     04/06/24 1000  dapsone  tablet 100 mg        100 mg Oral Daily 04/06/24 0748     04/06/24 0800  Darunavir -Cobicistat-Emtricitabine -Tenofovir  Alafenamide (SYMTUZA ) 800-150-200-10 MG TABS 1 tablet        1 tablet Oral Daily with breakfast 04/06/24 0748     04/06/24 0800  piperacillin -tazobactam (ZOSYN ) IVPB 3.375 g  Status:  Discontinued        3.375 g 12.5 mL/hr over 240 Minutes Intravenous Every 8 hours 04/06/24 0757 04/08/24 1035         Subjective: Seen and examined.  He has no complaints.  Not happy that he has to stay in the hospital.  Explained to him the rationale behind that and the fact that his creatinine is going in the wrong direction/rising.  Objective: Vitals:    04/08/24 2100 04/08/24 2336 04/09/24 0400 04/09/24 0800  BP: 105/75  121/72 126/79  Pulse: (!) 114 98 83 89  Resp: 20  15   Temp: 98.3 F (36.8 C)  98.5 F (36.9 C) 98.3 F (36.8 C)  TempSrc: Oral  Oral Oral  SpO2:    97%  Weight:      Height:        Intake/Output Summary (Last 24 hours) at 04/09/2024 0822 Last data filed at 04/08/2024 1945 Gross per 24 hour  Intake 1250 ml  Output 1100 ml  Net 150 ml   Filed Weights   04/06/24 0735 04/06/24 1706 04/07/24 0822  Weight: 85.2 kg 85.3 kg 85.3 kg    Examination:  General exam: Appears calm and comfortable  Respiratory system: Clear to auscultation. Respiratory effort normal. Cardiovascular system: S1 & S2 heard, RRR. No JVD, murmurs, rubs, gallops or clicks. No pedal edema. Gastrointestinal system: Abdomen is nondistended, soft and nontender. No organomegaly or masses felt. Normal bowel sounds heard. Central nervous  system: Alert and oriented. No focal neurological deficits. Extremities: Symmetric 5 x 5 power. Skin: No rashes, lesions or ulcers.   Data Reviewed: I have personally reviewed following labs and imaging studies  CBC: Recent Labs  Lab 04/06/24 0500 04/07/24 0433 04/08/24 0500 04/09/24 0412  WBC 7.1 5.4 5.3 13.6*  NEUTROABS 3.7  --  4.7 12.2*  HGB 9.8* 8.3* 8.0* 7.9*  HCT 30.9* 26.2* 25.1* 24.7*  MCV 101.0* 100.0 100.0 100.4*  PLT 341 277 299 307   Basic Metabolic Panel: Recent Labs  Lab 04/06/24 0500 04/07/24 0433 04/08/24 0500 04/09/24 0412  NA 132* 133* 136 135  K 4.0 3.8 3.7 4.0  CL 101 107 105 109  CO2 20* 20* 22 17*  GLUCOSE 88 115* 149* 110*  BUN 16 19 18 17   CREATININE 2.24* 2.57* 2.38* 2.51*  CALCIUM  8.6* 7.8* 8.2* 7.7*   GFR: Estimated Creatinine Clearance: 44.2 mL/min (A) (by C-G formula based on SCr of 2.51 mg/dL (H)). Liver Function Tests: Recent Labs  Lab 04/06/24 0500 04/07/24 0433  AST 17 14*  ALT 12 10  ALKPHOS 54 46  BILITOT 0.5 0.6  PROT 9.6* 7.9  ALBUMIN  2.7* 1.9*    Recent Labs  Lab 04/06/24 0500  LIPASE 30   No results for input(s): AMMONIA in the last 168 hours. Coagulation Profile: No results for input(s): INR, PROTIME in the last 168 hours. Cardiac Enzymes: No results for input(s): CKTOTAL, CKMB, CKMBINDEX, TROPONINI in the last 168 hours. BNP (last 3 results) No results for input(s): PROBNP in the last 8760 hours. HbA1C: No results for input(s): HGBA1C in the last 72 hours. CBG: No results for input(s): GLUCAP in the last 168 hours. Lipid Profile: No results for input(s): CHOL, HDL, LDLCALC, TRIG, CHOLHDL, LDLDIRECT in the last 72 hours. Thyroid Function Tests: No results for input(s): TSH, T4TOTAL, FREET4, T3FREE, THYROIDAB in the last 72 hours. Anemia Panel: Recent Labs    04/07/24 0432 04/07/24 1347  VITAMINB12  --  747  FOLATE 10.2  --   FERRITIN  --  161  TIBC  --  189*  IRON  --  35*  RETICCTPCT 2.3  --    Sepsis Labs: No results for input(s): PROCALCITON, LATICACIDVEN in the last 168 hours.  Recent Results (from the past 240 hours)  Urine Culture     Status: Abnormal   Collection Time: 04/06/24  6:19 AM   Specimen: Urine, Clean Catch  Result Value Ref Range Status   Specimen Description URINE, CLEAN CATCH  Final   Special Requests   Final    NONE Performed at [facility], [address], [place], [place] [address]    Culture (A)  Final    40,000 COLONIES/mL KLEBSIELLA PNEUMONIAE 80,000 COLONIES/mL PROTEUS MIRABILIS    Report Status 04/08/2024 FINAL  Final   Organism ID, Bacteria KLEBSIELLA PNEUMONIAE (A)  Final   Organism ID, Bacteria PROTEUS MIRABILIS (A)  Final      Susceptibility   Klebsiella pneumoniae - MIC*    AMPICILLIN >=32 RESISTANT Resistant     CEFAZOLIN  (URINE) Value in next row Sensitive      4 SENSITIVEThis is a modified FDA-approved test that has been validated and its performance characteristics determined by the reporting laboratory.   This laboratory is certified under the Clinical Laboratory Improvement Amendments CLIA as qualified to perform high complexity clinical laboratory testing.    CEFEPIME  Value in next row Sensitive      4 SENSITIVEThis is a  modified FDA-approved test that has been validated and its performance characteristics determined by the reporting laboratory.  This laboratory is certified under the Clinical Laboratory Improvement Amendments CLIA as qualified to perform high complexity clinical laboratory testing.    ERTAPENEM  Value in next row Sensitive      4 SENSITIVEThis is a modified FDA-approved test that has been validated and its performance characteristics determined by the reporting laboratory.  This laboratory is certified under the Clinical Laboratory Improvement Amendments CLIA as qualified to perform high complexity clinical laboratory testing.    CEFTRIAXONE  Value in next row Sensitive      4 SENSITIVEThis is a modified FDA-approved test that has been validated and its performance characteristics determined by the reporting laboratory.  This laboratory is certified under the Clinical Laboratory Improvement Amendments CLIA as qualified to perform high complexity clinical laboratory testing.    CIPROFLOXACIN  Value in next row Sensitive      4 SENSITIVEThis is a modified FDA-approved test that has been validated and its performance characteristics determined by the reporting laboratory.  This laboratory is certified under the Clinical Laboratory Improvement Amendments CLIA as qualified to perform high complexity clinical laboratory testing.    GENTAMICIN  Value in next row Sensitive      4 SENSITIVEThis is a modified FDA-approved test that has been validated and its performance characteristics determined by the reporting laboratory.  This laboratory is certified under the Clinical Laboratory Improvement Amendments CLIA as qualified to perform high complexity clinical laboratory testing.    NITROFURANTOIN   Value in next row Sensitive      4 SENSITIVEThis is a modified FDA-approved test that has been validated and its performance characteristics determined by the reporting laboratory.  This laboratory is certified under the Clinical Laboratory Improvement Amendments CLIA as qualified to perform high complexity clinical laboratory testing.    TRIMETH /SULFA  Value in next row Sensitive      4 SENSITIVEThis is a modified FDA-approved test that has been validated and its performance characteristics determined by the reporting laboratory.  This laboratory is certified under the Clinical Laboratory Improvement Amendments CLIA as qualified to perform high complexity clinical laboratory testing.    AMPICILLIN/SULBACTAM Value in next row Intermediate      4 SENSITIVEThis is a modified FDA-approved test that has been validated and its performance characteristics determined by the reporting laboratory.  This laboratory is certified under the Clinical Laboratory Improvement Amendments CLIA as qualified to perform high complexity clinical laboratory testing.    PIP/TAZO Value in next row Sensitive ug/mL     16 SENSITIVEThis is a modified FDA-approved test that has been validated and its performance characteristics determined by the reporting laboratory.  This laboratory is certified under the Clinical Laboratory Improvement Amendments CLIA as qualified to perform high complexity clinical laboratory testing.    MEROPENEM  Value in next row Sensitive      16 SENSITIVEThis is a modified FDA-approved test that has been validated and its performance characteristics determined by the reporting laboratory.  This laboratory is certified under the Clinical Laboratory Improvement Amendments CLIA as qualified to perform high complexity clinical laboratory testing.    * 40,000 COLONIES/mL KLEBSIELLA PNEUMONIAE   Proteus mirabilis - MIC*    AMPICILLIN Value in next row Resistant      16 SENSITIVEThis is a modified FDA-approved test  that has been validated and its performance characteristics determined by the reporting laboratory.  This laboratory is certified under the Clinical Laboratory Improvement Amendments CLIA as qualified to perform high complexity  clinical laboratory testing.    CEFAZOLIN  (URINE) Value in next row Resistant      >=32 RESISTANTThis is a modified FDA-approved test that has been validated and its performance characteristics determined by the reporting laboratory.  This laboratory is certified under the Clinical Laboratory Improvement Amendments CLIA as qualified to perform high complexity clinical laboratory testing.    CEFEPIME  Value in next row Sensitive      >=32 RESISTANTThis is a modified FDA-approved test that has been validated and its performance characteristics determined by the reporting laboratory.  This laboratory is certified under the Clinical Laboratory Improvement Amendments CLIA as qualified to perform high complexity clinical laboratory testing.    ERTAPENEM  Value in next row Sensitive      >=32 RESISTANTThis is a modified FDA-approved test that has been validated and its performance characteristics determined by the reporting laboratory.  This laboratory is certified under the Clinical Laboratory Improvement Amendments CLIA as qualified to perform high complexity clinical laboratory testing.    CEFTRIAXONE  Value in next row Sensitive      >=32 RESISTANTThis is a modified FDA-approved test that has been validated and its performance characteristics determined by the reporting laboratory.  This laboratory is certified under the Clinical Laboratory Improvement Amendments CLIA as qualified to perform high complexity clinical laboratory testing.    CIPROFLOXACIN  Value in next row Sensitive      >=32 RESISTANTThis is a modified FDA-approved test that has been validated and its performance characteristics determined by the reporting laboratory.  This laboratory is certified under the Clinical  Laboratory Improvement Amendments CLIA as qualified to perform high complexity clinical laboratory testing.    GENTAMICIN  Value in next row Sensitive      >=32 RESISTANTThis is a modified FDA-approved test that has been validated and its performance characteristics determined by the reporting laboratory.  This laboratory is certified under the Clinical Laboratory Improvement Amendments CLIA as qualified to perform high complexity clinical laboratory testing.    NITROFURANTOIN  Value in next row Resistant      >=32 RESISTANTThis is a modified FDA-approved test that has been validated and its performance characteristics determined by the reporting laboratory.  This laboratory is certified under the Clinical Laboratory Improvement Amendments CLIA as qualified to perform high complexity clinical laboratory testing.    TRIMETH /SULFA  Value in next row Sensitive      >=32 RESISTANTThis is a modified FDA-approved test that has been validated and its performance characteristics determined by the reporting laboratory.  This laboratory is certified under the Clinical Laboratory Improvement Amendments CLIA as qualified to perform high complexity clinical laboratory testing.    AMPICILLIN/SULBACTAM Value in next row Sensitive      >=32 RESISTANTThis is a modified FDA-approved test that has been validated and its performance characteristics determined by the reporting laboratory.  This laboratory is certified under the Clinical Laboratory Improvement Amendments CLIA as qualified to perform high complexity clinical laboratory testing.    PIP/TAZO Value in next row Sensitive ug/mL     <=4 SENSITIVEThis is a modified FDA-approved test that has been validated and its performance characteristics determined by the reporting laboratory.  This laboratory is certified under the Clinical Laboratory Improvement Amendments CLIA as qualified to perform high complexity clinical laboratory testing.    MEROPENEM  Value in next row  Sensitive      <=4 SENSITIVEThis is a modified FDA-approved test that has been validated and its performance characteristics determined by the reporting laboratory.  This laboratory is certified under the Clinical Laboratory Improvement  Amendments CLIA as qualified to perform high complexity clinical laboratory testing.    * 80,000 COLONIES/mL PROTEUS MIRABILIS     Radiology Studies: DG C-Arm 1-60 Min-No Report Result Date: 04/07/2024 Fluoroscopy was utilized by the requesting physician.  No radiographic interpretation.    Scheduled Meds:  dapsone   100 mg Oral Daily   Darunavir -Cobicistat-Emtricitabine -Tenofovir  Alafenamide  1 tablet Oral Q breakfast   enoxaparin  (LOVENOX ) injection  40 mg Subcutaneous Q24H   pantoprazole   40 mg Oral Daily   tamsulosin   0.4 mg Oral Daily   Continuous Infusions:  cefTRIAXone  (ROCEPHIN )  IV 2 g (04/08/24 1144)     LOS: 3 days   Fredia Skeeter, MD [facility]  04/09/2024, 8:22 AM   *Please note that this is a verbal dictation therefore any spelling or grammatical errors are due to the Dragon Medical One system interpretation.  Please page via Amion and do not message via secure chat for urgent patient care matters. Secure chat can be used for non urgent patient care matters.  How to contact the [facility] Attending or Consulting provider 7A - 7P or covering provider during after hours 7P -7A, for this patient?  Check the care team in [facility] and look for a) attending/consulting [facility] provider listed and b) the [facility] team listed. Page or secure chat 7A-7P. Log into [email or web address] and use [place]'s universal password to access. If you do not have the password, please contact the hospital operator. Locate the [facility] provider you are looking for under [facility] and page to a number that you can be directly reached. If you still have difficulty reaching the provider, please page the [facility] (Director on Call) for the Hospitalists listed on amion for  assistance.

## 2024-04-09 NOTE — Care Management Important Message (Signed)
 Important Message  Patient Details  Name: Jacob Gutierrez MRN: [id number] Date of Birth: 1986-06-14   Important Message Given:  Yes - Medicare IM     Jon Cruel 04/09/2024, 11:55 AM

## 2024-04-10 ENCOUNTER — Inpatient Hospital Stay (HOSPITAL_COMMUNITY)

## 2024-04-10 DIAGNOSIS — N390 Urinary tract infection, site not specified: Secondary | ICD-10-CM | POA: Diagnosis not present

## 2024-04-10 LAB — CBC WITH DIFFERENTIAL/PLATELET
Abs Immature Granulocytes: 0.12 K/uL — ABNORMAL HIGH (ref 0.00–0.07)
Basophils Absolute: 0 K/uL (ref 0.0–0.1)
Basophils Relative: 0 %
Eosinophils Absolute: 0.6 K/uL — ABNORMAL HIGH (ref 0.0–0.5)
Eosinophils Relative: 7 %
HCT: 26.6 % — ABNORMAL LOW (ref 39.0–52.0)
Hemoglobin: 8.4 g/dL — ABNORMAL LOW (ref 13.0–17.0)
Immature Granulocytes: 1 %
Lymphocytes Relative: 26 %
Lymphs Abs: 2.4 K/uL (ref 0.7–4.0)
MCH: 31.8 pg (ref 26.0–34.0)
MCHC: 31.6 g/dL (ref 30.0–36.0)
MCV: 100.8 fL — ABNORMAL HIGH (ref 80.0–100.0)
Monocytes Absolute: 1 K/uL (ref 0.1–1.0)
Monocytes Relative: 10 %
Neutro Abs: 5 K/uL (ref 1.7–7.7)
Neutrophils Relative %: 56 %
Platelets: 318 K/uL (ref 150–400)
RBC: 2.64 MIL/uL — ABNORMAL LOW (ref 4.22–5.81)
RDW: 15.6 % — ABNORMAL HIGH (ref 11.5–15.5)
Smear Review: NORMAL
WBC: 9.1 K/uL (ref 4.0–10.5)
nRBC: 0 % (ref 0.0–0.2)

## 2024-04-10 LAB — BASIC METABOLIC PANEL WITH GFR
Anion gap: 14 (ref 5–15)
BUN: 20 mg/dL (ref 6–20)
CO2: 20 mmol/L — ABNORMAL LOW (ref 22–32)
Calcium: 8.1 mg/dL — ABNORMAL LOW (ref 8.9–10.3)
Chloride: 104 mmol/L (ref 98–111)
Creatinine, Ser: 2.28 mg/dL — ABNORMAL HIGH (ref 0.61–1.24)
GFR, Estimated: 36 mL/min — ABNORMAL LOW (ref >60)
Glucose, Bld: 74 mg/dL (ref 70–99)
Potassium: 3.7 mmol/L (ref 3.5–5.1)
Sodium: 138 mmol/L (ref 135–145)

## 2024-04-10 MED ORDER — FUROSEMIDE 10 MG/ML IJ SOLN
42.6500 mg | Freq: Once | INTRAMUSCULAR | Status: AC
  Administered 2024-04-10: 42.65 mg via INTRAVENOUS

## 2024-04-10 MED ORDER — FUROSEMIDE 10 MG/ML IJ SOLN
INTRAMUSCULAR | Status: AC
  Filled 2024-04-10: qty 4

## 2024-04-10 MED ORDER — TECHNETIUM TC 99M MERTIATIDE
5.4000 | Freq: Once | INTRAVENOUS | Status: AC | PRN
  Administered 2024-04-10: 5.4 via INTRAVENOUS

## 2024-04-10 MED ORDER — SODIUM CHLORIDE 0.9 % IV SOLN: INTRAVENOUS | Status: AC

## 2024-04-10 MED ORDER — FUROSEMIDE 10 MG/ML IJ SOLN
INTRAMUSCULAR | Status: AC
Start: 2024-04-10 — End: 2024-04-10
  Filled 2024-04-10: qty 2

## 2024-04-10 NOTE — Progress Notes (Signed)
 PROGRESS NOTE    RISHIKESH KHACHATRYAN  FMW:[id number] DOB: [birth year] DOA: 04/05/2024 PCP: Freddrick, No   Brief Narrative:  HPI: Jacob Gutierrez is a 38 y.o. male with medical history significant of HIV/AIDS, hx HIV, GERD, CVA, CKD, chronic prostatic abscess, prior rt ureteral stenting, bilat hydronephrosis, left ureteral obstruction; s/p left NU cath 4/18 who presents with left flank pain and urinary symptoms.  Patient states for the last 2 weeks he has been dribbling out of his penis like urinating when he does not mean to.  He has been wearing a diaper actually.  He does have a history of a nephrostomy tube on the left at some point.  He states that he had a fever yesterday to 87 F.  Denies any abdominal pain nausea, vomiting, diarrhea, hematochezia.  Denies flulike symptoms, shortness of breath, cough.  Denies any hematuria.   Per chart review patient was admitted to urology from 11/07/2023 to 11/15/2023. Per DC summary from that admission: Previously seen for large prostatic fluid collection/abscess extending into the bladder.  Due to the complicated nature of his case he was referred to Dr. Levonia of [facility] who examined his information and felt that his prostate abscess could be removed conventionally, directing him back to our practice.  He is HIV positive and has been noncompliant with previous PrEP for some time.  This is somewhat better though appears to be an ongoing issue.  He was taken back to the operating room by Dr. Watt on 11/07/2023 to address his prostatic abscess and associated bilateral hydronephrosis.  The prostate was able to be unroofed transurethrally.  The side was sucessfully stented, but the left could not be safely accessed. Pt ultimately received a percutaneous nephrostomy tube on this side.  He underwent a capping trial while in the hospital but reported severe pain when urinating and asked that it be placed back to gravity drainage.  He has follow-up with interventional  radiology where they will manage tube exchanges and consideration for antegrade stent placement in the interim.  Infectious disease is following him and provided him with 2 weeks of samples of his antibiotic as well as scheduled follow-up.      ED Course: Upon arrival to ED, patient was hypertensive and tachycardic but no fever, hypoxia or tachypnea. No leukocytosis.  Mild AKI on BMP.  Lipase normal.  CT abdomen pelvis shows following findings.  Patient given Zosyn  after speaking to me.  Urology has been consulted and will see in consultation.  Hospitalist consulted for admission.  Assessment & Plan:   Principal Problem:   Complicated UTI (urinary tract infection)  Complicated UTI in a patient with chronic prostate abscess and history of bilateral hydronephrosis and bilateral ureteral stenting: UA consistent with UTI.  CT does not show any new prostate abscess.  However shows inflammation affecting renal collecting system and ureters but no pyelonephritis per radiology report.  Urine cultures growing 40,000 colonies of Klebsiella pneumoniae and 80,000 colonies of Proteus mirabilis.  Zosyn  transition to Rocephin  04/08/2024 per pharmacy recommendations.  Urology on board, patient status post bilateral retrograde pyelogram/stent exchange 04/07/2024.   AKI: Baseline creatinine around 1-1.3, now presented with 2.24.  Likely in the setting of infection and possibly obstruction, creatinine peaked at 2.57 despite of hydration however after procedure, creatinine improved a little bit but back to 2.5 again yesterday, urology was requested to reassess patient as I was concerned about obstruction.  Underwent Lasix  renogram today which shows patency of bilateral stents with no  obstruction and no hydronephrosis.  Creatinine has improved a little bit today.  Perhaps it is just taking little time to improve.  Will hydrate gently again today and repeat labs in the morning.  Discussed plan of care with the patient.    Mild hyponatremia: Resolved.   GERD: Resume PPI.   Anemia of chronic disease: Hemoglobin at baseline around 10, dropped today to 8.3.  Will check iron studies, B12, folate as well as FOBT.  No history of hematochezia, hematemesis or melena.  DVT prophylaxis: enoxaparin  (LOVENOX ) injection 40 mg Start: 04/07/24 0900   Code Status: Full Code  Family Communication:  None present at bedside.  Plan of care discussed with patient in length and he/she verbalized understanding and agreed with it.  Status is: Inpatient Remains inpatient appropriate because: Still elevated creatinine, needs IV fluids.  Needs creatinine to improve further.   Estimated body mass index is 24.81 kg/m as calculated from the following:   Height as of this encounter: 6' 1 (1.854 m).   Weight as of this encounter: 85.3 kg.    Nutritional Assessment: Body mass index is 24.81 kg/m.SABRA Seen by dietician.  I agree with the assessment and plan as outlined below: Nutrition Status:        . Skin Assessment: I have examined the patient's skin and I agree with the wound assessment as performed by the wound care RN as outlined below:    Consultants:  Urology  Procedures:  As above  Antimicrobials:  Anti-infectives (From admission, onward)    Start     Dose/Rate Route Frequency Ordered Stop   04/08/24 1130  cefTRIAXone  (ROCEPHIN ) 2 g in sodium chloride  0.9 % 100 mL IVPB        2 g 200 mL/hr over 30 Minutes Intravenous Every 24 hours 04/08/24 1035     04/06/24 1000  dapsone  tablet 100 mg        100 mg Oral Daily 04/06/24 0748     04/06/24 0800  Darunavir -Cobicistat-Emtricitabine -Tenofovir  Alafenamide (SYMTUZA ) 800-150-200-10 MG TABS 1 tablet        1 tablet Oral Daily with breakfast 04/06/24 0748     04/06/24 0800  piperacillin -tazobactam (ZOSYN ) IVPB 3.375 g  Status:  Discontinued        3.375 g 12.5 mL/hr over 240 Minutes Intravenous Every 8 hours 04/06/24 0757 04/08/24 1035          Subjective: Patient seen and examined.  He has no complaints at all.  Objective: Vitals:   04/09/24 0800 04/09/24 2032 04/10/24 0419 04/10/24 1313  BP: 126/79 131/89 (!) 146/79 (!) 144/91  Pulse: 89 (!) 107 (!) 111 94  Resp:  19 18 18   Temp: 98.3 F (36.8 C) 98.2 F (36.8 C) 99.1 F (37.3 C) 98.6 F (37 C)  TempSrc: Oral Tympanic Oral Oral  SpO2: 97% 98% 97% 97%  Weight:      Height:        Intake/Output Summary (Last 24 hours) at 04/10/2024 1506 Last data filed at 04/10/2024 1312 Gross per 24 hour  Intake 2040 ml  Output --  Net 2040 ml   Filed Weights   04/06/24 0735 04/06/24 1706 04/07/24 0822  Weight: 85.2 kg 85.3 kg 85.3 kg    Examination:  General exam: Appears calm and comfortable  Respiratory system: Clear to auscultation. Respiratory effort normal. Cardiovascular system: S1 & S2 heard, RRR. No JVD, murmurs, rubs, gallops or clicks. No pedal edema. Gastrointestinal system: Abdomen is nondistended, soft and nontender. No organomegaly  or masses felt. Normal bowel sounds heard. Central nervous system: Alert and oriented. No focal neurological deficits. Extremities: Symmetric 5 x 5 power. Skin: No rashes, lesions or ulcers.  Psychiatry: Judgement and insight appear normal. Mood & affect appropriate.   Data Reviewed: I have personally reviewed following labs and imaging studies  CBC: Recent Labs  Lab 04/06/24 0500 04/07/24 0433 04/08/24 0500 04/09/24 0412 04/10/24 0445  WBC 7.1 5.4 5.3 13.6* 9.1  NEUTROABS 3.7  --  4.7 12.2* 5.0  HGB 9.8* 8.3* 8.0* 7.9* 8.4*  HCT 30.9* 26.2* 25.1* 24.7* 26.6*  MCV 101.0* 100.0 100.0 100.4* 100.8*  PLT 341 277 299 307 318   Basic Metabolic Panel: Recent Labs  Lab 04/06/24 0500 04/07/24 0433 04/08/24 0500 04/09/24 0412 04/10/24 0445  NA 132* 133* 136 135 138  K 4.0 3.8 3.7 4.0 3.7  CL 101 107 105 109 104  CO2 20* 20* 22 17* 20*  GLUCOSE 88 115* 149* 110* 74  BUN 16 19 18 17 20   CREATININE 2.24* 2.57*  2.38* 2.51* 2.28*  CALCIUM  8.6* 7.8* 8.2* 7.7* 8.1*   GFR: Estimated Creatinine Clearance: 48.7 mL/min (A) (by C-G formula based on SCr of 2.28 mg/dL (H)). Liver Function Tests: Recent Labs  Lab 04/06/24 0500 04/07/24 0433  AST 17 14*  ALT 12 10  ALKPHOS 54 46  BILITOT 0.5 0.6  PROT 9.6* 7.9  ALBUMIN  2.7* 1.9*   Recent Labs  Lab 04/06/24 0500  LIPASE 30   No results for input(s): AMMONIA in the last 168 hours. Coagulation Profile: No results for input(s): INR, PROTIME in the last 168 hours. Cardiac Enzymes: No results for input(s): CKTOTAL, CKMB, CKMBINDEX, TROPONINI in the last 168 hours. BNP (last 3 results) No results for input(s): PROBNP in the last 8760 hours. HbA1C: No results for input(s): HGBA1C in the last 72 hours. CBG: No results for input(s): GLUCAP in the last 168 hours. Lipid Profile: No results for input(s): CHOL, HDL, LDLCALC, TRIG, CHOLHDL, LDLDIRECT in the last 72 hours. Thyroid Function Tests: No results for input(s): TSH, T4TOTAL, FREET4, T3FREE, THYROIDAB in the last 72 hours. Anemia Panel: No results for input(s): VITAMINB12, FOLATE, FERRITIN, TIBC, IRON, RETICCTPCT in the last 72 hours.  Sepsis Labs: No results for input(s): PROCALCITON, LATICACIDVEN in the last 168 hours.  Recent Results (from the past 240 hours)  Urine Culture     Status: Abnormal   Collection Time: 04/06/24  6:19 AM   Specimen: Urine, Clean Catch  Result Value Ref Range Status   Specimen Description URINE, CLEAN CATCH  Final   Special Requests   Final    NONE Performed at [facility], [address], [place], [place] [address]    Culture (A)  Final    40,000 COLONIES/mL KLEBSIELLA PNEUMONIAE 80,000 COLONIES/mL PROTEUS MIRABILIS    Report Status 04/08/2024 FINAL  Final   Organism ID, Bacteria KLEBSIELLA PNEUMONIAE (A)  Final   Organism ID, Bacteria PROTEUS MIRABILIS (A)  Final      Susceptibility    Klebsiella pneumoniae - MIC*    AMPICILLIN >=32 RESISTANT Resistant     CEFAZOLIN  (URINE) Value in next row Sensitive      4 SENSITIVEThis is a modified FDA-approved test that has been validated and its performance characteristics determined by the reporting laboratory.  This laboratory is certified under the Clinical Laboratory Improvement Amendments CLIA as qualified to perform high complexity clinical laboratory testing.    CEFEPIME  Value in next row Sensitive  4 SENSITIVEThis is a modified FDA-approved test that has been validated and its performance characteristics determined by the reporting laboratory.  This laboratory is certified under the Clinical Laboratory Improvement Amendments CLIA as qualified to perform high complexity clinical laboratory testing.    ERTAPENEM  Value in next row Sensitive      4 SENSITIVEThis is a modified FDA-approved test that has been validated and its performance characteristics determined by the reporting laboratory.  This laboratory is certified under the Clinical Laboratory Improvement Amendments CLIA as qualified to perform high complexity clinical laboratory testing.    CEFTRIAXONE  Value in next row Sensitive      4 SENSITIVEThis is a modified FDA-approved test that has been validated and its performance characteristics determined by the reporting laboratory.  This laboratory is certified under the Clinical Laboratory Improvement Amendments CLIA as qualified to perform high complexity clinical laboratory testing.    CIPROFLOXACIN  Value in next row Sensitive      4 SENSITIVEThis is a modified FDA-approved test that has been validated and its performance characteristics determined by the reporting laboratory.  This laboratory is certified under the Clinical Laboratory Improvement Amendments CLIA as qualified to perform high complexity clinical laboratory testing.    GENTAMICIN  Value in next row Sensitive      4 SENSITIVEThis is a modified FDA-approved test that  has been validated and its performance characteristics determined by the reporting laboratory.  This laboratory is certified under the Clinical Laboratory Improvement Amendments CLIA as qualified to perform high complexity clinical laboratory testing.    NITROFURANTOIN  Value in next row Sensitive      4 SENSITIVEThis is a modified FDA-approved test that has been validated and its performance characteristics determined by the reporting laboratory.  This laboratory is certified under the Clinical Laboratory Improvement Amendments CLIA as qualified to perform high complexity clinical laboratory testing.    TRIMETH /SULFA  Value in next row Sensitive      4 SENSITIVEThis is a modified FDA-approved test that has been validated and its performance characteristics determined by the reporting laboratory.  This laboratory is certified under the Clinical Laboratory Improvement Amendments CLIA as qualified to perform high complexity clinical laboratory testing.    AMPICILLIN/SULBACTAM Value in next row Intermediate      4 SENSITIVEThis is a modified FDA-approved test that has been validated and its performance characteristics determined by the reporting laboratory.  This laboratory is certified under the Clinical Laboratory Improvement Amendments CLIA as qualified to perform high complexity clinical laboratory testing.    PIP/TAZO Value in next row Sensitive ug/mL     16 SENSITIVEThis is a modified FDA-approved test that has been validated and its performance characteristics determined by the reporting laboratory.  This laboratory is certified under the Clinical Laboratory Improvement Amendments CLIA as qualified to perform high complexity clinical laboratory testing.    MEROPENEM  Value in next row Sensitive      16 SENSITIVEThis is a modified FDA-approved test that has been validated and its performance characteristics determined by the reporting laboratory.  This laboratory is certified under the Clinical Laboratory  Improvement Amendments CLIA as qualified to perform high complexity clinical laboratory testing.    * 40,000 COLONIES/mL KLEBSIELLA PNEUMONIAE   Proteus mirabilis - MIC*    AMPICILLIN Value in next row Resistant      16 SENSITIVEThis is a modified FDA-approved test that has been validated and its performance characteristics determined by the reporting laboratory.  This laboratory is certified under the Clinical Laboratory Improvement Amendments CLIA as qualified  to perform high complexity clinical laboratory testing.    CEFAZOLIN  (URINE) Value in next row Resistant      >=32 RESISTANTThis is a modified FDA-approved test that has been validated and its performance characteristics determined by the reporting laboratory.  This laboratory is certified under the Clinical Laboratory Improvement Amendments CLIA as qualified to perform high complexity clinical laboratory testing.    CEFEPIME  Value in next row Sensitive      >=32 RESISTANTThis is a modified FDA-approved test that has been validated and its performance characteristics determined by the reporting laboratory.  This laboratory is certified under the Clinical Laboratory Improvement Amendments CLIA as qualified to perform high complexity clinical laboratory testing.    ERTAPENEM  Value in next row Sensitive      >=32 RESISTANTThis is a modified FDA-approved test that has been validated and its performance characteristics determined by the reporting laboratory.  This laboratory is certified under the Clinical Laboratory Improvement Amendments CLIA as qualified to perform high complexity clinical laboratory testing.    CEFTRIAXONE  Value in next row Sensitive      >=32 RESISTANTThis is a modified FDA-approved test that has been validated and its performance characteristics determined by the reporting laboratory.  This laboratory is certified under the Clinical Laboratory Improvement Amendments CLIA as qualified to perform high complexity clinical  laboratory testing.    CIPROFLOXACIN  Value in next row Sensitive      >=32 RESISTANTThis is a modified FDA-approved test that has been validated and its performance characteristics determined by the reporting laboratory.  This laboratory is certified under the Clinical Laboratory Improvement Amendments CLIA as qualified to perform high complexity clinical laboratory testing.    GENTAMICIN  Value in next row Sensitive      >=32 RESISTANTThis is a modified FDA-approved test that has been validated and its performance characteristics determined by the reporting laboratory.  This laboratory is certified under the Clinical Laboratory Improvement Amendments CLIA as qualified to perform high complexity clinical laboratory testing.    NITROFURANTOIN  Value in next row Resistant      >=32 RESISTANTThis is a modified FDA-approved test that has been validated and its performance characteristics determined by the reporting laboratory.  This laboratory is certified under the Clinical Laboratory Improvement Amendments CLIA as qualified to perform high complexity clinical laboratory testing.    TRIMETH /SULFA  Value in next row Sensitive      >=32 RESISTANTThis is a modified FDA-approved test that has been validated and its performance characteristics determined by the reporting laboratory.  This laboratory is certified under the Clinical Laboratory Improvement Amendments CLIA as qualified to perform high complexity clinical laboratory testing.    AMPICILLIN/SULBACTAM Value in next row Sensitive      >=32 RESISTANTThis is a modified FDA-approved test that has been validated and its performance characteristics determined by the reporting laboratory.  This laboratory is certified under the Clinical Laboratory Improvement Amendments CLIA as qualified to perform high complexity clinical laboratory testing.    PIP/TAZO Value in next row Sensitive ug/mL     <=4 SENSITIVEThis is a modified FDA-approved test that has been  validated and its performance characteristics determined by the reporting laboratory.  This laboratory is certified under the Clinical Laboratory Improvement Amendments CLIA as qualified to perform high complexity clinical laboratory testing.    MEROPENEM  Value in next row Sensitive      <=4 SENSITIVEThis is a modified FDA-approved test that has been validated and its performance characteristics determined by the reporting laboratory.  This laboratory is certified under  the Clinical Laboratory Improvement Amendments CLIA as qualified to perform high complexity clinical laboratory testing.    * 80,000 COLONIES/mL PROTEUS MIRABILIS     Radiology Studies: NM Renal Imaging Flow W/Pharm Result Date: 04/10/2024 CLINICAL DATA:  Assess for early stent failure. Bilateral ureteral stents EXAM: NUCLEAR MEDICINE RENAL SCAN WITH DIURETIC ADMINISTRATION TECHNIQUE: Radionuclide angiographic and sequential renal images were obtained after intravenous injection of radiopharmaceutical. Imaging was continued during slow intravenous injection of Lasix  approximately 15 minutes after the start of the examination. RADIOPHARMACEUTICALS:  5.4 mCi Technetium-38m MAG3 IV COMPARISON:  CT 04/06/2024 FINDINGS: Flow:  Prompt symmetric arterial flow to the kidneys. Left renogram: Normal accumulation within the LEFT renal cortex. Counts are rapidly excreted into the collecting system and clear through the LEFT ureter. No hydronephrosis. Right renogram: Normal accumulation within the RIGHT renal cortex. Counts are rapidly excreted into the collecting system and clear through the RIGHT ureter. No hydronephrosis. Early filling of the bladder.  Mild postvoid residual Differential: Left kidney = 43 % Right kidney = 57 % T1/2 post Lasix  : Camera moved during examination. Post Lasix  data cannot be process due to movement camera. IMPRESSION: 1. No evidence of stent failure. Urine clears through the LEFT and RIGHT ureteral stents and fills the  bladder. 2. No evidence of hydronephrosis. 3. Mild postvoid residual on the LEFT and RIGHT Electronically Signed   By: Jackquline Boxer M.D.   On: 04/10/2024 14:30    Scheduled Meds:  dapsone   100 mg Oral Daily   Darunavir -Cobicistat-Emtricitabine -Tenofovir  Alafenamide  1 tablet Oral Q breakfast   enoxaparin  (LOVENOX ) injection  40 mg Subcutaneous Q24H   pantoprazole   40 mg Oral Daily   tamsulosin   0.4 mg Oral Daily   Continuous Infusions:  sodium chloride      cefTRIAXone  (ROCEPHIN )  IV 2 g (04/10/24 1315)     LOS: 4 days   Fredia Skeeter, MD [facility]  04/10/2024, 3:06 PM   *Please note that this is a verbal dictation therefore any spelling or grammatical errors are due to the Dragon Medical One system interpretation.  Please page via Amion and do not message via secure chat for urgent patient care matters. Secure chat can be used for non urgent patient care matters.  How to contact the [facility] Attending or Consulting provider 7A - 7P or covering provider during after hours 7P -7A, for this patient?  Check the care team in [facility] and look for a) attending/consulting [facility] provider listed and b) the [facility] team listed. Page or secure chat 7A-7P. Log into [email or web address] and use 's universal password to access. If you do not have the password, please contact the hospital operator. Locate the [facility] provider you are looking for under [facility] and page to a number that you can be directly reached. If you still have difficulty reaching the provider, please page the [facility] (Director on Call) for the Hospitalists listed on amion for assistance.

## 2024-04-10 NOTE — Progress Notes (Signed)
 Pt educated on use of lasix  for scan. Pt verbalized understanding, all questions answered by this RN. Tolerated med admin.

## 2024-04-11 ENCOUNTER — Other Ambulatory Visit (HOSPITAL_COMMUNITY): Payer: Self-pay

## 2024-04-11 DIAGNOSIS — N390 Urinary tract infection, site not specified: Secondary | ICD-10-CM | POA: Diagnosis not present

## 2024-04-11 LAB — BASIC METABOLIC PANEL WITH GFR
Anion gap: 12 (ref 5–15)
BUN: 23 mg/dL — ABNORMAL HIGH (ref 6–20)
CO2: 22 mmol/L (ref 22–32)
Calcium: 8.3 mg/dL — ABNORMAL LOW (ref 8.9–10.3)
Chloride: 103 mmol/L (ref 98–111)
Creatinine, Ser: 1.66 mg/dL — ABNORMAL HIGH (ref 0.61–1.24)
GFR, Estimated: 53 mL/min — ABNORMAL LOW (ref >60)
Glucose, Bld: 98 mg/dL (ref 70–99)
Potassium: 4.1 mmol/L (ref 3.5–5.1)
Sodium: 137 mmol/L (ref 135–145)

## 2024-04-11 MED ORDER — TAMSULOSIN HCL 0.4 MG PO CAPS
0.4000 mg | ORAL_CAPSULE | Freq: Every day | ORAL | 0 refills | Status: AC
  Filled 2024-04-11: qty 30, 30d supply, fill #0

## 2024-04-11 MED ORDER — CIPROFLOXACIN HCL 500 MG PO TABS
500.0000 mg | ORAL_TABLET | Freq: Two times a day (BID) | ORAL | 0 refills | Status: AC
Start: 2024-04-11 — End: 2024-04-18
  Filled 2024-04-11: qty 14, 7d supply, fill #0

## 2024-04-11 NOTE — Plan of Care (Signed)
   Problem: Education: Goal: Knowledge of General Education information will improve Description Including pain rating scale, medication(s)/side effects and non-pharmacologic comfort measures Outcome: Progressing   Problem: Health Behavior/Discharge Planning: Goal: Ability to manage health-related needs will improve Outcome: Progressing

## 2024-04-11 NOTE — TOC Transition Note (Signed)
 Transition of Care [facility]) - Discharge Note   Patient Details  Name: Jacob Gutierrez MRN: [id number] Date of Birth: 1985/12/18  [facility] of [facility] CM/SW Contact:  Rosalva Jon Bloch, RN Phone Number: 04/11/2024, 1:23 PM   Clinical Narrative:    Patient will DC to: home Anticipated DC date: 04/11/2024 Family notified: yes Transport by: car  Per MD patient ready for DC today. RN, patient, and patient's mom notified of DC. Pt states will f/u with PCP once d/c. States can't remember PCP's name. Taxi voucher provided, pt without transportation to home and limited funds. Pt without RX med concerns.  RNCM will sign off for now as intervention is no longer needed. Please consult us  again if new needs arise.   Final next level of care: Home/Self Care Barriers to Discharge: No Barriers Identified   Patient Goals and CMS Choice            Discharge Placement                       Discharge Plan and Services Additional resources added to the After Visit Summary for                                       Social Drivers of Health (SDOH) Interventions SDOH Screenings   Food Insecurity: No Food Insecurity (04/06/2024)  Housing: High Risk (04/06/2024)  Transportation Needs: Unmet Transportation Needs (04/06/2024)  Utilities: Not At Risk (04/06/2024)  Depression (PHQ2-9): Low Risk  (11/30/2023)  Financial Resource Strain: Low Risk  (06/09/2018)  Physical Activity: Unknown (06/09/2018)  Social Connections: Patient Declined (11/07/2023)  Tobacco Use: High Risk (04/07/2024)     Readmission Risk Interventions     No data to display

## 2024-04-11 NOTE — Progress Notes (Signed)
   4 Days Post-Op Subjective: No acute events overnight.  Pt having breakfast on rounds. No LUTS. Concerned about ED. Reviewed case and plan.   Objective: Vital signs in last 24 hours: Temp:  [97.8 F (36.6 C)-98.6 F (37 C)] 97.8 F (36.6 C) (09/18 0907) Pulse Rate:  [94-110] 103 (09/18 0907) Resp:  [16-18] 16 (09/18 0907) BP: (133-144)/(84-91) 133/84 (09/18 0907) SpO2:  [97 %-98 %] 98 % (09/18 9092)  Assessment/Plan: # Bilateral hydronephrosis/hydroureter # AKI #ED  Exchange/Reposition ureteral stents with Dr. Twylla on 04/07/2024 Chronic ESBL UTI.  Tailor to sensitivities Lasix  renogram showed relatively balanced renal function and patent stents. AKI is not obstructive in nature.  Still experiencing nocturnal erections but struggles with ED. Will discuss in clinic.  Urology will sign off at this time. Please call with questions.   Intake/Output from previous day: 09/17 0701 - 09/18 0700 In: 1304.3 [P.O.:1080; I.V.:224.3] Out: 2825 [Urine:2825]  Intake/Output this shift: Total I/O In: 620 [P.O.:620] Out: -   Physical Exam:  General: Alert and oriented CV: No cyanosis Lungs: equal chest rise Abdomen: Soft, NTND, no rebound or guarding  Lab Results: Recent Labs    04/09/24 0412 04/10/24 0445  HGB 7.9* 8.4*  HCT 24.7* 26.6*   BMET Recent Labs    04/09/24 0412 04/10/24 0445  NA 135 138  K 4.0 3.7  CL 109 104  CO2 17* 20*  GLUCOSE 110* 74  BUN 17 20  CREATININE 2.51* 2.28*  CALCIUM  7.7* 8.1*  HGB 7.9* 8.4*  WBC 13.6* 9.1     Studies/Results: NM Renal Imaging Flow W/Pharm Result Date: 04/10/2024 CLINICAL DATA:  Assess for early stent failure. Bilateral ureteral stents EXAM: NUCLEAR MEDICINE RENAL SCAN WITH DIURETIC ADMINISTRATION TECHNIQUE: Radionuclide angiographic and sequential renal images were obtained after intravenous injection of radiopharmaceutical. Imaging was continued during slow intravenous injection of Lasix  approximately 15 minutes  after the start of the examination. RADIOPHARMACEUTICALS:  5.4 mCi Technetium-19m MAG3 IV COMPARISON:  CT 04/06/2024 FINDINGS: Flow:  Prompt symmetric arterial flow to the kidneys. Left renogram: Normal accumulation within the LEFT renal cortex. Counts are rapidly excreted into the collecting system and clear through the LEFT ureter. No hydronephrosis. Right renogram: Normal accumulation within the RIGHT renal cortex. Counts are rapidly excreted into the collecting system and clear through the RIGHT ureter. No hydronephrosis. Early filling of the bladder.  Mild postvoid residual Differential: Left kidney = 43 % Right kidney = 57 % T1/2 post Lasix  : Camera moved during examination. Post Lasix  data cannot be process due to movement camera. IMPRESSION: 1. No evidence of stent failure. Urine clears through the LEFT and RIGHT ureteral stents and fills the bladder. 2. No evidence of hydronephrosis. 3. Mild postvoid residual on the LEFT and RIGHT Electronically Signed   By: Jackquline Boxer M.D.   On: 04/10/2024 14:30      LOS: 5 days   Ole Bourdon, NP [facility] Pager: [phone]  04/11/2024, 10:36 AM

## 2024-04-11 NOTE — Discharge Summary (Signed)
 Physician Discharge Summary  Jacob Gutierrez:[id number] DOB: September 20, 1985 DOA: 04/05/2024  PCP: Pcp, No  Admit date: 04/05/2024 Discharge date: 04/11/2024 30 Day Unplanned Readmission Risk Score    Flowsheet Row ED to Hosp-Admission (Current) from 04/05/2024 in [facility]  30 Day Unplanned Readmission Risk Score (%) 15.44 Filed at 04/11/2024 1200    This score is the patient's risk of an unplanned readmission within 30 days of being discharged (0 -100%). The score is based on dignosis, age, lab data, medications, orders, and past utilization.   Low:  0-14.9   Medium: 15-21.9   High: 22-29.9   Extreme: 30 and above          Admitted From: Home Disposition: Home  Recommendations for Outpatient Follow-up:  Follow up with PCP in 1-2 weeks Please obtain BMP/CBC in one week Follow-up with urology in 2 to 4 weeks Please follow up with your PCP on the following pending results: Unresulted Labs (From admission, onward)     Start     Ordered   04/13/24 0500  Creatinine, serum  (enoxaparin  (LOVENOX )    CrCl >/= 30 ml/min)  Weekly,   R     Comments: while on enoxaparin  therapy    04/06/24 0748   04/07/24 0523  Surgical pcr screen  Once,   R        04/07/24 0523              Home Health: None Equipment/Devices: None  Discharge Condition: Stable CODE STATUS: Full code Diet recommendation:  Diet Order             Diet regular Room service appropriate? Yes; Fluid consistency: Thin  Diet effective now                   Subjective: Seen and examined, he has no complaints.  He is eager to go home.  Brief/Interim Summary: HPI: Jacob Gutierrez is a 38 y.o. male with medical history significant of HIV/AIDS, hx HIV, GERD, CVA, CKD, chronic prostatic abscess, prior rt ureteral stenting, bilat hydronephrosis, left ureteral obstruction; s/p left NU cath 4/18 who presents with left flank pain and urinary symptoms.  Patient states for the  last 2 weeks he has been dribbling out of his penis like urinating when he does not mean to.  He has been wearing a diaper actually.  He does have a history of a nephrostomy tube on the left at some point.  He states that he had a fever yesterday to 4 F.  Denies any abdominal pain nausea, vomiting, diarrhea, hematochezia.  Denies flulike symptoms, shortness of breath, cough.  Denies any hematuria.   Per chart review patient was admitted to urology from 11/07/2023 to 11/15/2023. Per DC summary from that admission: Previously seen for large prostatic fluid collection/abscess extending into the bladder.  Due to the complicated nature of his case he was referred to Dr. Levonia of [facility] who examined his information and felt that his prostate abscess could be removed conventionally, directing him back to our practice.  He is HIV positive and has been noncompliant with previous PrEP for some time.  This is somewhat better though appears to be an ongoing issue.  He was taken back to the operating room by Dr. Watt on 11/07/2023 to address his prostatic abscess and associated bilateral hydronephrosis.  The prostate was able to be unroofed transurethrally.  The side was sucessfully stented, but the left could not be  safely accessed. Pt ultimately received a percutaneous nephrostomy tube on this side.  He underwent a capping trial while in the hospital but reported severe pain when urinating and asked that it be placed back to gravity drainage.  He has follow-up with interventional radiology where they will manage tube exchanges and consideration for antegrade stent placement in the interim.  Infectious disease is following him and provided him with 2 weeks of samples of his antibiotic as well as scheduled follow-up.    ED Course: Upon arrival to ED, patient was hypertensive and tachycardic but no fever, hypoxia or tachypnea. No leukocytosis.  Mild AKI on BMP.  Lipase normal.  CT abdomen pelvis shows following  findings.  Patient given Zosyn  after speaking to me.  Urology has been consulted and will see in consultation.  Hospitalist consulted for admission.   Complicated UTI in a patient with chronic prostate abscess and history of bilateral hydronephrosis and bilateral ureteral stenting: UA consistent with UTI.  CT does not show any new prostate abscess.  However shows inflammation affecting renal collecting system and ureters but no pyelonephritis per radiology report.  Urine cultures growing 40,000 colonies of Klebsiella pneumoniae and 80,000 colonies of Proteus mirabilis.  Zosyn  transitioned to Rocephin  04/08/2024 per pharmacy recommendations.  Urology on board, patient status post bilateral retrograde pyelogram/stent exchange 04/07/2024.  Discharging on Flomax , follow-up with urology in 2 to 4 weeks.  Discussed with pharmacy, based on the culture sensitivities and the fact that this is complicated UTI, discharging on 7 more days of ciprofloxacin , has received 6 days of antibiotics IV here.   AKI: Baseline creatinine around 1-1.3, presented with 2.24.  Likely in the setting of infection and possibly obstruction, creatinine peaked at 2.57 despite of hydration however after procedure, creatinine improved a little bit but back to 2.5 again day before yesterday, I was concerned about obstruction so urology was requested to reassess patient.  Underwent Lasix  renogram yesterday which shows patency of bilateral stents with no obstruction and no hydronephrosis.  Creatinine has improved significantly down to 1.66 today, perhaps it was better after time.  Since we have a trend of creatinine to improve, patient appears to be stable for discharge.  I have advised him to hydrate himself well for next couple of days.  Mild hyponatremia: Resolved.   GERD: Resume PPI.   Anemia of chronic disease: Hemoglobin at baseline around 10,  Hemoglobin improved from 7.9 yesterday to 8.4 today without transfusion.  FOBT negative.     Discharge Diagnoses:  Principal Problem:   Complicated UTI (urinary tract infection)    Discharge Instructions   Allergies as of 04/11/2024       Reactions   Vancomycin  Anaphylaxis, Itching, Swelling, Other (See Comments)   Angioedema and everything swells   Amoxicillin Other (See Comments)   From childhood: I had a reaction when i was little. (??) Has tolerated multiple cephalosporins in the past and zosyn          Medication List     TAKE these medications    CertaVite/Antioxidants Tabs Take 1 tablet by mouth daily.   ciprofloxacin  500 MG tablet Commonly known as: Cipro  Take 1 tablet (500 mg total) by mouth 2 (two) times daily for 7 days.   dapsone  100 MG tablet Take 1 tablet (100 mg total) by mouth daily.   omeprazole  40 MG capsule Commonly known as: PRILOSEC Take 40 mg by mouth daily.   Symtuza  800-150-200-10 MG Tabs Generic drug: Darunavir -Cobicistat-Emtricitabine -Tenofovir  Alafenamide Take 1 tablet by mouth  daily with breakfast.   tamsulosin  0.4 MG Caps capsule Commonly known as: FLOMAX  Take 1 capsule (0.4 mg total) by mouth daily. Start taking on: April 12, 2024        Follow-up Information     PCP Follow up in 1 week(s).          [facility] Follow up in 2 week(s).   Contact information: [address] [place] [address] [place]  [address] [phone]               Allergies  Allergen Reactions   Vancomycin  Anaphylaxis, Itching, Swelling and Other (See Comments)    Angioedema and everything swells   Amoxicillin Other (See Comments)    From childhood: I had a reaction when i was little. (??) Has tolerated multiple cephalosporins in the past and zosyn      Consultations: Urology   Procedures/Studies: NM Renal Imaging Flow W/Pharm Result Date: 04/10/2024 CLINICAL DATA:  Assess for early stent failure. Bilateral ureteral stents EXAM: NUCLEAR MEDICINE RENAL SCAN WITH DIURETIC ADMINISTRATION  TECHNIQUE: Radionuclide angiographic and sequential renal images were obtained after intravenous injection of radiopharmaceutical. Imaging was continued during slow intravenous injection of Lasix  approximately 15 minutes after the start of the examination. RADIOPHARMACEUTICALS:  5.4 mCi Technetium-51m MAG3 IV COMPARISON:  CT 04/06/2024 FINDINGS: Flow:  Prompt symmetric arterial flow to the kidneys. Left renogram: Normal accumulation within the LEFT renal cortex. Counts are rapidly excreted into the collecting system and clear through the LEFT ureter. No hydronephrosis. Right renogram: Normal accumulation within the RIGHT renal cortex. Counts are rapidly excreted into the collecting system and clear through the RIGHT ureter. No hydronephrosis. Early filling of the bladder.  Mild postvoid residual Differential: Left kidney = 43 % Right kidney = 57 % T1/2 post Lasix  : Camera moved during examination. Post Lasix  data cannot be process due to movement camera. IMPRESSION: 1. No evidence of stent failure. Urine clears through the LEFT and RIGHT ureteral stents and fills the bladder. 2. No evidence of hydronephrosis. 3. Mild postvoid residual on the LEFT and RIGHT Electronically Signed   By: Jackquline Boxer M.D.   On: 04/10/2024 14:30   DG C-Arm 1-60 Min-No Report Result Date: 04/07/2024 Fluoroscopy was utilized by the requesting physician.  No radiographic interpretation.   CT ABDOMEN PELVIS W CONTRAST Result Date: 04/06/2024 CLINICAL DATA:  38 year old male with abdominal pain. History of nephrostomy tubes, ureteral stents. Prostatic abscess. EXAM: CT ABDOMEN AND PELVIS WITH CONTRAST TECHNIQUE: Multidetector CT imaging of the abdomen and pelvis was performed using the standard protocol following bolus administration of intravenous contrast. RADIATION DOSE REDUCTION: This exam was performed according to the departmental dose-optimization program which includes automated exposure control, adjustment of the mA  and/or kV according to patient size and/or use of iterative reconstruction technique. CONTRAST:  60mL OMNIPAQUE  IOHEXOL  350 MG/ML SOLN COMPARISON:  Noncontrast CT Abdomen and Pelvis 07/24/2023. Report of nephrostomy tube exchange 12/28/2023. FINDINGS: Lower chest: Negative. Hepatobiliary: Negative liver. Gallbladder mildly distended but otherwise within normal limits. No biliary ductal dilatation. Pancreas: Negative. Spleen: Negative. Adrenals/Urinary Tract: Normal adrenal glands. Highly abnormal bilateral kidneys and ureters. Bilateral hydroureteronephrosis with severe urothelial soft tissue thickening and inflammation (right ureter coronal image 64, right renal pelvis coronal image 59, proximal left ureter coronal image 51). And this despite the presence of bilateral double-J ureteral stents in place. The right stent proximal pigtail is in the lower pole of the right renal collecting system. The left proximal pigtail is in the renal pelvis.  Both stents terminates in the urinary bladder. Severe right urothelial thickening continues distally, mild to moderate on the left (series 3, image 54). Similar bladder wall thickening, although the bladder is decompressed. Punctate right renal lower pole collecting system stone. No other discrete urinary calculus. Renal parenchymal enhancement seems relatively maintained, no obvious striated nephrogram despite the severe urothelial inflammation. Stomach/Bowel: Nondilated large and small bowel loops. Low-density retained stool in much of the colon. No large bowel wall thickening is identified. Appendix is not delineated. Decompressed stomach. No pneumoperitoneum or free fluid identified. Vascular/Lymphatic: Suboptimal contrast bolus. Grossly patent major vascular structures. No atherosclerosis identified. Retroperitoneal lymphadenopathy with individual nodes up to 13 mm short axis (series 3, images 35-48) does not appear significantly changed from last year. No cystic or  necrotic nodes. Reproductive: Native prostate gland seems diminutive. Dilated, lobulated prostatic urethra suspected on series 3, images 83 and 84, and similar appearance of the posterior E through on series 3, image 90. These findings are stable to improved since 07/24/2023. No obvious regional inflammation. Other: No pelvis free fluid. Musculoskeletal: No acute or suspicious osseous lesion identified. IMPRESSION: 1. Tremendous / very Severe Right > Left Urothelial Thickening and Inflammation affecting the renal collecting systems and ureters, with bilateral hydroureteronephrosis. This despite well-positioned bilateral double-J ureteral stents. Less pronounced bladder wall thickening. No convincing pyelonephritis. Minimal nephrolithiasis. Chronic retroperitoneal lymphadenopathy, stable and likely reactive. 2. Seemingly diminutive prostate. Chronically dilated prostatic urethra / posterior urethra appears stable to improved since 07/24/2023 CT. 3. Recommend Urology consultation. Electronically Signed   By: VEAR Hurst M.D.   On: 04/06/2024 06:56     Discharge Exam: Vitals:   04/10/24 2029 04/11/24 0907  BP: 134/88 133/84  Pulse: (!) 110 (!) 103  Resp: 17 16  Temp:  97.8 F (36.6 C)  SpO2: 98% 98%   Vitals:   04/10/24 0419 04/10/24 1313 04/10/24 2029 04/11/24 0907  BP: (!) 146/79 (!) 144/91 134/88 133/84  Pulse: (!) 111 94 (!) 110 (!) 103  Resp: 18 18 17 16   Temp: 99.1 F (37.3 C) 98.6 F (37 C)  97.8 F (36.6 C)  TempSrc: Oral Oral  Oral  SpO2: 97% 97% 98% 98%  Weight:      Height:        General: Pt is alert, awake, not in acute distress Cardiovascular: RRR, S1/S2 +, no rubs, no gallops Respiratory: CTA bilaterally, no wheezing, no rhonchi Abdominal: Soft, NT, ND, bowel sounds + Extremities: no edema, no cyanosis    The results of significant diagnostics from this hospitalization (including imaging, microbiology, ancillary and laboratory) are listed below for reference.      Microbiology: Recent Results (from the past 240 hours)  Urine Culture     Status: Abnormal   Collection Time: 04/06/24  6:19 AM   Specimen: Urine, Clean Catch  Result Value Ref Range Status   Specimen Description URINE, CLEAN CATCH  Final   Special Requests   Final    NONE Performed at [facility], [address] [place], [place] [address]    Culture (A)  Final    40,000 COLONIES/mL KLEBSIELLA PNEUMONIAE 80,000 COLONIES/mL PROTEUS MIRABILIS    Report Status 04/08/2024 FINAL  Final   Organism ID, Bacteria KLEBSIELLA PNEUMONIAE (A)  Final   Organism ID, Bacteria PROTEUS MIRABILIS (A)  Final      Susceptibility   Klebsiella pneumoniae - MIC*    AMPICILLIN >=32 RESISTANT Resistant     CEFAZOLIN  (URINE) Value in next row Sensitive  4 SENSITIVEThis is a modified FDA-approved test that has been validated and its performance characteristics determined by the reporting laboratory.  This laboratory is certified under the Clinical Laboratory Improvement Amendments CLIA as qualified to perform high complexity clinical laboratory testing.    CEFEPIME  Value in next row Sensitive      4 SENSITIVEThis is a modified FDA-approved test that has been validated and its performance characteristics determined by the reporting laboratory.  This laboratory is certified under the Clinical Laboratory Improvement Amendments CLIA as qualified to perform high complexity clinical laboratory testing.    ERTAPENEM  Value in next row Sensitive      4 SENSITIVEThis is a modified FDA-approved test that has been validated and its performance characteristics determined by the reporting laboratory.  This laboratory is certified under the Clinical Laboratory Improvement Amendments CLIA as qualified to perform high complexity clinical laboratory testing.    CEFTRIAXONE  Value in next row Sensitive      4 SENSITIVEThis is a modified FDA-approved test that has been validated and its performance characteristics  determined by the reporting laboratory.  This laboratory is certified under the Clinical Laboratory Improvement Amendments CLIA as qualified to perform high complexity clinical laboratory testing.    CIPROFLOXACIN  Value in next row Sensitive      4 SENSITIVEThis is a modified FDA-approved test that has been validated and its performance characteristics determined by the reporting laboratory.  This laboratory is certified under the Clinical Laboratory Improvement Amendments CLIA as qualified to perform high complexity clinical laboratory testing.    GENTAMICIN  Value in next row Sensitive      4 SENSITIVEThis is a modified FDA-approved test that has been validated and its performance characteristics determined by the reporting laboratory.  This laboratory is certified under the Clinical Laboratory Improvement Amendments CLIA as qualified to perform high complexity clinical laboratory testing.    NITROFURANTOIN  Value in next row Sensitive      4 SENSITIVEThis is a modified FDA-approved test that has been validated and its performance characteristics determined by the reporting laboratory.  This laboratory is certified under the Clinical Laboratory Improvement Amendments CLIA as qualified to perform high complexity clinical laboratory testing.    TRIMETH /SULFA  Value in next row Sensitive      4 SENSITIVEThis is a modified FDA-approved test that has been validated and its performance characteristics determined by the reporting laboratory.  This laboratory is certified under the Clinical Laboratory Improvement Amendments CLIA as qualified to perform high complexity clinical laboratory testing.    AMPICILLIN/SULBACTAM Value in next row Intermediate      4 SENSITIVEThis is a modified FDA-approved test that has been validated and its performance characteristics determined by the reporting laboratory.  This laboratory is certified under the Clinical Laboratory Improvement Amendments CLIA as qualified to perform high  complexity clinical laboratory testing.    PIP/TAZO Value in next row Sensitive ug/mL     16 SENSITIVEThis is a modified FDA-approved test that has been validated and its performance characteristics determined by the reporting laboratory.  This laboratory is certified under the Clinical Laboratory Improvement Amendments CLIA as qualified to perform high complexity clinical laboratory testing.    MEROPENEM  Value in next row Sensitive      16 SENSITIVEThis is a modified FDA-approved test that has been validated and its performance characteristics determined by the reporting laboratory.  This laboratory is certified under the Clinical Laboratory Improvement Amendments CLIA as qualified to perform high complexity clinical laboratory testing.    * 40,000 COLONIES/mL KLEBSIELLA  PNEUMONIAE   Proteus mirabilis - MIC*    AMPICILLIN Value in next row Resistant      16 SENSITIVEThis is a modified FDA-approved test that has been validated and its performance characteristics determined by the reporting laboratory.  This laboratory is certified under the Clinical Laboratory Improvement Amendments CLIA as qualified to perform high complexity clinical laboratory testing.    CEFAZOLIN  (URINE) Value in next row Resistant      >=32 RESISTANTThis is a modified FDA-approved test that has been validated and its performance characteristics determined by the reporting laboratory.  This laboratory is certified under the Clinical Laboratory Improvement Amendments CLIA as qualified to perform high complexity clinical laboratory testing.    CEFEPIME  Value in next row Sensitive      >=32 RESISTANTThis is a modified FDA-approved test that has been validated and its performance characteristics determined by the reporting laboratory.  This laboratory is certified under the Clinical Laboratory Improvement Amendments CLIA as qualified to perform high complexity clinical laboratory testing.    ERTAPENEM  Value in next row Sensitive       >=32 RESISTANTThis is a modified FDA-approved test that has been validated and its performance characteristics determined by the reporting laboratory.  This laboratory is certified under the Clinical Laboratory Improvement Amendments CLIA as qualified to perform high complexity clinical laboratory testing.    CEFTRIAXONE  Value in next row Sensitive      >=32 RESISTANTThis is a modified FDA-approved test that has been validated and its performance characteristics determined by the reporting laboratory.  This laboratory is certified under the Clinical Laboratory Improvement Amendments CLIA as qualified to perform high complexity clinical laboratory testing.    CIPROFLOXACIN  Value in next row Sensitive      >=32 RESISTANTThis is a modified FDA-approved test that has been validated and its performance characteristics determined by the reporting laboratory.  This laboratory is certified under the Clinical Laboratory Improvement Amendments CLIA as qualified to perform high complexity clinical laboratory testing.    GENTAMICIN  Value in next row Sensitive      >=32 RESISTANTThis is a modified FDA-approved test that has been validated and its performance characteristics determined by the reporting laboratory.  This laboratory is certified under the Clinical Laboratory Improvement Amendments CLIA as qualified to perform high complexity clinical laboratory testing.    NITROFURANTOIN  Value in next row Resistant      >=32 RESISTANTThis is a modified FDA-approved test that has been validated and its performance characteristics determined by the reporting laboratory.  This laboratory is certified under the Clinical Laboratory Improvement Amendments CLIA as qualified to perform high complexity clinical laboratory testing.    TRIMETH /SULFA  Value in next row Sensitive      >=32 RESISTANTThis is a modified FDA-approved test that has been validated and its performance characteristics determined by the reporting laboratory.   This laboratory is certified under the Clinical Laboratory Improvement Amendments CLIA as qualified to perform high complexity clinical laboratory testing.    AMPICILLIN/SULBACTAM Value in next row Sensitive      >=32 RESISTANTThis is a modified FDA-approved test that has been validated and its performance characteristics determined by the reporting laboratory.  This laboratory is certified under the Clinical Laboratory Improvement Amendments CLIA as qualified to perform high complexity clinical laboratory testing.    PIP/TAZO Value in next row Sensitive ug/mL     <=4 SENSITIVEThis is a modified FDA-approved test that has been validated and its performance characteristics determined by the reporting laboratory.  This laboratory is certified under  the Clinical Laboratory Improvement Amendments CLIA as qualified to perform high complexity clinical laboratory testing.    MEROPENEM  Value in next row Sensitive      <=4 SENSITIVEThis is a modified FDA-approved test that has been validated and its performance characteristics determined by the reporting laboratory.  This laboratory is certified under the Clinical Laboratory Improvement Amendments CLIA as qualified to perform high complexity clinical laboratory testing.    * 80,000 COLONIES/mL PROTEUS MIRABILIS     Labs: BNP (last 3 results) No results for input(s): BNP in the last 8760 hours. Basic Metabolic Panel: Recent Labs  Lab 04/07/24 0433 04/08/24 0500 04/09/24 0412 04/10/24 0445 04/11/24 0929  NA 133* 136 135 138 137  K 3.8 3.7 4.0 3.7 4.1  CL 107 105 109 104 103  CO2 20* 22 17* 20* 22  GLUCOSE 115* 149* 110* 74 98  BUN 19 18 17 20  23*  CREATININE 2.57* 2.38* 2.51* 2.28* 1.66*  CALCIUM  7.8* 8.2* 7.7* 8.1* 8.3*   Liver Function Tests: Recent Labs  Lab 04/06/24 0500 04/07/24 0433  AST 17 14*  ALT 12 10  ALKPHOS 54 46  BILITOT 0.5 0.6  PROT 9.6* 7.9  ALBUMIN  2.7* 1.9*   Recent Labs  Lab 04/06/24 0500  LIPASE 30   No  results for input(s): AMMONIA in the last 168 hours. CBC: Recent Labs  Lab 04/06/24 0500 04/07/24 0433 04/08/24 0500 04/09/24 0412 04/10/24 0445  WBC 7.1 5.4 5.3 13.6* 9.1  NEUTROABS 3.7  --  4.7 12.2* 5.0  HGB 9.8* 8.3* 8.0* 7.9* 8.4*  HCT 30.9* 26.2* 25.1* 24.7* 26.6*  MCV 101.0* 100.0 100.0 100.4* 100.8*  PLT 341 277 299 307 318   Cardiac Enzymes: No results for input(s): CKTOTAL, CKMB, CKMBINDEX, TROPONINI in the last 168 hours. BNP: Invalid input(s): POCBNP CBG: No results for input(s): GLUCAP in the last 168 hours. D-Dimer No results for input(s): DDIMER in the last 72 hours. Hgb A1c No results for input(s): HGBA1C in the last 72 hours. Lipid Profile No results for input(s): CHOL, HDL, LDLCALC, TRIG, CHOLHDL, LDLDIRECT in the last 72 hours. Thyroid function studies No results for input(s): TSH, T4TOTAL, T3FREE, THYROIDAB in the last 72 hours.  Invalid input(s): FREET3 Anemia work up No results for input(s): VITAMINB12, FOLATE, FERRITIN, TIBC, IRON, RETICCTPCT in the last 72 hours. Urinalysis    Component Value Date/Time   COLORURINE YELLOW 04/06/2024 0450   APPEARANCEUR TURBID (A) 04/06/2024 0450   APPEARANCEUR Hazy 07/27/2013 1742   LABSPEC 1.009 04/06/2024 0450   LABSPEC 1.026 07/27/2013 1742   PHURINE 7.0 04/06/2024 0450   GLUCOSEU NEGATIVE 04/06/2024 0450   GLUCOSEU Negative 07/27/2013 1742   HGBUR MODERATE (A) 04/06/2024 0450   BILIRUBINUR NEGATIVE 04/06/2024 0450   BILIRUBINUR Negative 07/27/2013 1742   KETONESUR NEGATIVE 04/06/2024 0450   PROTEINUR 100 (A) 04/06/2024 0450   UROBILINOGEN 1 12/17/2013 1118   NITRITE NEGATIVE 04/06/2024 0450   LEUKOCYTESUR MODERATE (A) 04/06/2024 0450   LEUKOCYTESUR Negative 07/27/2013 1742   Sepsis Labs Recent Labs  Lab 04/07/24 0433 04/08/24 0500 04/09/24 0412 04/10/24 0445  WBC 5.4 5.3 13.6* 9.1   Microbiology Recent Results (from the past 240 hours)   Urine Culture     Status: Abnormal   Collection Time: 04/06/24  6:19 AM   Specimen: Urine, Clean Catch  Result Value Ref Range Status   Specimen Description URINE, CLEAN CATCH  Final   Special Requests   Final    NONE Performed at [facility]  Lab, [address] [place], [place] [address]    Culture (A)  Final    40,000 COLONIES/mL KLEBSIELLA PNEUMONIAE 80,000 COLONIES/mL PROTEUS MIRABILIS    Report Status 04/08/2024 FINAL  Final   Organism ID, Bacteria KLEBSIELLA PNEUMONIAE (A)  Final   Organism ID, Bacteria PROTEUS MIRABILIS (A)  Final      Susceptibility   Klebsiella pneumoniae - MIC*    AMPICILLIN >=32 RESISTANT Resistant     CEFAZOLIN  (URINE) Value in next row Sensitive      4 SENSITIVEThis is a modified FDA-approved test that has been validated and its performance characteristics determined by the reporting laboratory.  This laboratory is certified under the Clinical Laboratory Improvement Amendments CLIA as qualified to perform high complexity clinical laboratory testing.    CEFEPIME  Value in next row Sensitive      4 SENSITIVEThis is a modified FDA-approved test that has been validated and its performance characteristics determined by the reporting laboratory.  This laboratory is certified under the Clinical Laboratory Improvement Amendments CLIA as qualified to perform high complexity clinical laboratory testing.    ERTAPENEM  Value in next row Sensitive      4 SENSITIVEThis is a modified FDA-approved test that has been validated and its performance characteristics determined by the reporting laboratory.  This laboratory is certified under the Clinical Laboratory Improvement Amendments CLIA as qualified to perform high complexity clinical laboratory testing.    CEFTRIAXONE  Value in next row Sensitive      4 SENSITIVEThis is a modified FDA-approved test that has been validated and its performance characteristics determined by the reporting laboratory.  This laboratory is  certified under the Clinical Laboratory Improvement Amendments CLIA as qualified to perform high complexity clinical laboratory testing.    CIPROFLOXACIN  Value in next row Sensitive      4 SENSITIVEThis is a modified FDA-approved test that has been validated and its performance characteristics determined by the reporting laboratory.  This laboratory is certified under the Clinical Laboratory Improvement Amendments CLIA as qualified to perform high complexity clinical laboratory testing.    GENTAMICIN  Value in next row Sensitive      4 SENSITIVEThis is a modified FDA-approved test that has been validated and its performance characteristics determined by the reporting laboratory.  This laboratory is certified under the Clinical Laboratory Improvement Amendments CLIA as qualified to perform high complexity clinical laboratory testing.    NITROFURANTOIN  Value in next row Sensitive      4 SENSITIVEThis is a modified FDA-approved test that has been validated and its performance characteristics determined by the reporting laboratory.  This laboratory is certified under the Clinical Laboratory Improvement Amendments CLIA as qualified to perform high complexity clinical laboratory testing.    TRIMETH /SULFA  Value in next row Sensitive      4 SENSITIVEThis is a modified FDA-approved test that has been validated and its performance characteristics determined by the reporting laboratory.  This laboratory is certified under the Clinical Laboratory Improvement Amendments CLIA as qualified to perform high complexity clinical laboratory testing.    AMPICILLIN/SULBACTAM Value in next row Intermediate      4 SENSITIVEThis is a modified FDA-approved test that has been validated and its performance characteristics determined by the reporting laboratory.  This laboratory is certified under the Clinical Laboratory Improvement Amendments CLIA as qualified to perform high complexity clinical laboratory testing.    PIP/TAZO Value  in next row Sensitive ug/mL     16 SENSITIVEThis is a modified FDA-approved test that has been validated and its  performance characteristics determined by the reporting laboratory.  This laboratory is certified under the Clinical Laboratory Improvement Amendments CLIA as qualified to perform high complexity clinical laboratory testing.    MEROPENEM  Value in next row Sensitive      16 SENSITIVEThis is a modified FDA-approved test that has been validated and its performance characteristics determined by the reporting laboratory.  This laboratory is certified under the Clinical Laboratory Improvement Amendments CLIA as qualified to perform high complexity clinical laboratory testing.    * 40,000 COLONIES/mL KLEBSIELLA PNEUMONIAE   Proteus mirabilis - MIC*    AMPICILLIN Value in next row Resistant      16 SENSITIVEThis is a modified FDA-approved test that has been validated and its performance characteristics determined by the reporting laboratory.  This laboratory is certified under the Clinical Laboratory Improvement Amendments CLIA as qualified to perform high complexity clinical laboratory testing.    CEFAZOLIN  (URINE) Value in next row Resistant      >=32 RESISTANTThis is a modified FDA-approved test that has been validated and its performance characteristics determined by the reporting laboratory.  This laboratory is certified under the Clinical Laboratory Improvement Amendments CLIA as qualified to perform high complexity clinical laboratory testing.    CEFEPIME  Value in next row Sensitive      >=32 RESISTANTThis is a modified FDA-approved test that has been validated and its performance characteristics determined by the reporting laboratory.  This laboratory is certified under the Clinical Laboratory Improvement Amendments CLIA as qualified to perform high complexity clinical laboratory testing.    ERTAPENEM  Value in next row Sensitive      >=32 RESISTANTThis is a modified FDA-approved test that has  been validated and its performance characteristics determined by the reporting laboratory.  This laboratory is certified under the Clinical Laboratory Improvement Amendments CLIA as qualified to perform high complexity clinical laboratory testing.    CEFTRIAXONE  Value in next row Sensitive      >=32 RESISTANTThis is a modified FDA-approved test that has been validated and its performance characteristics determined by the reporting laboratory.  This laboratory is certified under the Clinical Laboratory Improvement Amendments CLIA as qualified to perform high complexity clinical laboratory testing.    CIPROFLOXACIN  Value in next row Sensitive      >=32 RESISTANTThis is a modified FDA-approved test that has been validated and its performance characteristics determined by the reporting laboratory.  This laboratory is certified under the Clinical Laboratory Improvement Amendments CLIA as qualified to perform high complexity clinical laboratory testing.    GENTAMICIN  Value in next row Sensitive      >=32 RESISTANTThis is a modified FDA-approved test that has been validated and its performance characteristics determined by the reporting laboratory.  This laboratory is certified under the Clinical Laboratory Improvement Amendments CLIA as qualified to perform high complexity clinical laboratory testing.    NITROFURANTOIN  Value in next row Resistant      >=32 RESISTANTThis is a modified FDA-approved test that has been validated and its performance characteristics determined by the reporting laboratory.  This laboratory is certified under the Clinical Laboratory Improvement Amendments CLIA as qualified to perform high complexity clinical laboratory testing.    TRIMETH /SULFA  Value in next row Sensitive      >=32 RESISTANTThis is a modified FDA-approved test that has been validated and its performance characteristics determined by the reporting laboratory.  This laboratory is certified under the Clinical Laboratory  Improvement Amendments CLIA as qualified to perform high complexity clinical laboratory testing.    AMPICILLIN/SULBACTAM Value  in next row Sensitive      >=32 RESISTANTThis is a modified FDA-approved test that has been validated and its performance characteristics determined by the reporting laboratory.  This laboratory is certified under the Clinical Laboratory Improvement Amendments CLIA as qualified to perform high complexity clinical laboratory testing.    PIP/TAZO Value in next row Sensitive ug/mL     <=4 SENSITIVEThis is a modified FDA-approved test that has been validated and its performance characteristics determined by the reporting laboratory.  This laboratory is certified under the Clinical Laboratory Improvement Amendments CLIA as qualified to perform high complexity clinical laboratory testing.    MEROPENEM  Value in next row Sensitive      <=4 SENSITIVEThis is a modified FDA-approved test that has been validated and its performance characteristics determined by the reporting laboratory.  This laboratory is certified under the Clinical Laboratory Improvement Amendments CLIA as qualified to perform high complexity clinical laboratory testing.    * 80,000 COLONIES/mL PROTEUS MIRABILIS    FURTHER DISCHARGE INSTRUCTIONS:   Get Medicines reviewed and adjusted: Please take all your medications with you for your next visit with your Primary MD   Laboratory/radiological data: Please request your Primary MD to go over all hospital tests and procedure/radiological results at the follow up, please ask your Primary MD to get all Hospital records sent to his/her office.   In some cases, they will be blood work, cultures and biopsy results pending at the time of your discharge. Please request that your primary care M.D. goes through all the records of your hospital data and follows up on these results.   Also Note the following: If you experience worsening of your admission symptoms, develop  shortness of breath, life threatening emergency, suicidal or homicidal thoughts you must seek medical attention immediately by calling 911 or calling your MD immediately  if symptoms less severe.   You must read complete instructions/literature along with all the possible adverse reactions/side effects for all the Medicines you take and that have been prescribed to you. Take any new Medicines after you have completely understood and accpet all the possible adverse reactions/side effects.    patient was instructed, not to drive, operate heavy machinery, perform activities at heights, swimming or participation in water activities or provide baby-sitting services while on Pain, Sleep and Anxiety Medications; until their outpatient Physician has advised to do so again. Also recommended to not to take more than prescribed Pain, Sleep and Anxiety Medications.  It is not advisable to combine anxiety, sleep and pain medications without talking with your primary care provider.     Wear Seat belts while driving.   Please note: You were cared for by a hospitalist during your hospital stay. Once you are discharged, your primary care physician will handle any further medical issues. Please note that NO REFILLS for any discharge medications will be authorized once you are discharged, as it is imperative that you return to your primary care physician (or establish a relationship with a primary care physician if you do not have one) for your post hospital discharge needs so that they can reassess your need for medications and monitor your lab values  Time coordinating discharge: Over 30 minutes  SIGNED:   Fredia Skeeter, MD  [facility] 04/11/2024, 12:49 PM *Please note that this is a verbal dictation therefore any spelling or grammatical errors are due to the Dragon Medical One system interpretation. If 7PM-7AM, please contact night-coverage [email or web address]

## 2024-04-11 NOTE — Progress Notes (Signed)
 RN discharged.    Discharge Lounge notified.  Patient has taxi voucher.  TOC meds pending.    Transport leaving now.

## 2024-04-25 ENCOUNTER — Other Ambulatory Visit: Payer: Self-pay

## 2024-04-25 ENCOUNTER — Other Ambulatory Visit: Payer: Self-pay | Admitting: Pharmacist

## 2024-04-25 ENCOUNTER — Encounter: Payer: Self-pay | Admitting: Family

## 2024-04-25 ENCOUNTER — Ambulatory Visit (INDEPENDENT_AMBULATORY_CARE_PROVIDER_SITE_OTHER): Admitting: Family

## 2024-04-25 VITALS — BP 133/76 | HR 89 | Temp 99.3°F | Resp 16 | Wt 190.6 lb

## 2024-04-25 DIAGNOSIS — Z79899 Other long term (current) drug therapy: Secondary | ICD-10-CM

## 2024-04-25 DIAGNOSIS — Z0000 Encounter for general adult medical examination without abnormal findings: Secondary | ICD-10-CM

## 2024-04-25 DIAGNOSIS — B20 Human immunodeficiency virus [HIV] disease: Secondary | ICD-10-CM

## 2024-04-25 DIAGNOSIS — Z113 Encounter for screening for infections with a predominantly sexual mode of transmission: Secondary | ICD-10-CM | POA: Diagnosis not present

## 2024-04-25 MED ORDER — SYMTUZA 800-150-200-10 MG PO TABS: 1.0000 | ORAL_TABLET | Freq: Every day | ORAL | Status: AC

## 2024-04-25 MED ORDER — DAPSONE 100 MG PO TABS: 100.0000 mg | ORAL_TABLET | Freq: Every day | ORAL | 5 refills | Status: AC

## 2024-04-25 MED ORDER — SYMTUZA 800-150-200-10 MG PO TABS: 1.0000 | ORAL_TABLET | Freq: Every day | ORAL | 5 refills | Status: AC

## 2024-04-25 NOTE — Progress Notes (Signed)
 Medication Samples have been provided to the patient.  Drug name: Symtuza         Strength: 800/150/200/10 mg Qty: 30  Tablets (1 bottles) LOT: [id number]   Exp.Date: 7/26  Samples requested by Cathlyn July, NP .  Dosing instructions: Take one tablet by mouth once daily with food  The patient has been instructed regarding the correct time, dose, and frequency of taking this medication, including desired effects and most common side effects.   Alan Geralds, PharmD, CPP, BCIDP, AAHIVP Clinical Pharmacist Practitioner Infectious Diseases Clinical Pharmacist [facility]

## 2024-04-25 NOTE — Assessment & Plan Note (Signed)
 Discussed importance of safe sexual practice and condom use. Condoms and site specific STD testing offered.  Vaccinations reviewed and declined following counseling.  Declines dental care referral. Due for anal pap smear for anal cancer screening and defers today.

## 2024-04-25 NOTE — Patient Instructions (Addendum)
 Nice to see you.  We will check your lab work today.  Continue to take your medication daily as prescribed.  Refills have been sent to the pharmacy.  Plan for follow up in 3 months or sooner if needed with lab work on the same day.  Have a great day and stay safe!   Smoking Cessation: Quitline[place] 1-800-QUIT-NOW (867)848-3055); Espaol: 1-855-Djelo-Ya (1-(443) 065-6025) [email or web address]

## 2024-04-25 NOTE — Progress Notes (Signed)
 Brief Narrative   Patient ID: Jacob Gutierrez, male    DOB: 05/26/1986, 38 y.o.   MRN: [id number]  [name] is a 38 year-old African-American male diagnosed with HIV-1 disease in 2013 with risk factor of MSM.  Initial CD4 nadir of 60.  Previous history of gonorrhea, pulmonary cocciodomycosis.  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  Previous ART regimen include Stribild with Prezista  and now Symtuza .   Subjective:   Chief Complaint  Patient presents with   Follow-up    B20    Animal Bite    HPI:  Jacob Gutierrez is a 38 y.o. male with HIV disease last seen on 11/30/2023 by Dr. Dea for HIV follow-up and chronic prostatic abscess.  Reported good adherence and tolerance to Symtuza  with occasional missed doses.  Had completed a course of omadacyline for his prostatic abscess.  Lab work showed viral load of 351 with CD4 count 94.  RPR was stable at 1: 8.  Continued on Symtuza  supplemented with dapsone  for OI prophylaxis.  Here today for routine follow-up.  [name] was recently hospitalized for concern of urinary tract infection with symptoms now being resolved after completion of antibiotics.  Has been taking Symtuza  and dapsone  as prescribed however ran out approximately 1 week ago and has not picked up any additional refills.  Tolerating medication with no adverse side effects.  Has moved back to the [place] area.  Housing, transportation, and access to food are currently stable.  Covered by [employer].  No new concerns/complaints.  Condoms and site-specific STD testing offered.  Healthcare maintenance reviewed.  Denies fevers, chills, night sweats, headaches, changes in vision, neck pain/stiffness, nausea, diarrhea, vomiting, lesions or rashes.  Lab Results  Component Value Date   CD4TCELL 5 (L) 11/30/2023   CD4TABS <35 (L) 09/11/2023   Lab Results  Component Value Date   HIV1RNAQUANT 351 (H) 11/30/2023      Allergies  Allergen Reactions   Vancomycin  Anaphylaxis, Itching, Swelling and Other (See Comments)    Angioedema and everything swells   Amoxicillin Other (See Comments)    From childhood: I had a reaction when i was little. (??) Has tolerated multiple cephalosporins in the past and zosyn        Outpatient Medications Prior to Visit  Medication Sig Dispense Refill   omeprazole  (PRILOSEC) 40 MG capsule Take 40 mg by mouth daily.     tamsulosin  (FLOMAX ) 0.4 MG CAPS capsule Take 1 capsule (0.4 mg total) by mouth daily. 30 capsule 0   dapsone  100 MG tablet Take 1 tablet (100 mg total) by mouth daily. 30 tablet 5   Multiple Vitamins-Minerals (CERTAVITE/ANTIOXIDANTS) TABS Take 1 tablet by mouth daily. (Patient not taking: Reported on 04/25/2024)     Darunavir -Cobicistat-Emtricitabine -Tenofovir  Alafenamide (SYMTUZA ) 800-150-200-10 MG TABS Take 1 tablet by mouth daily with breakfast. (Patient not taking: Reported on 04/25/2024) 30 tablet 5   No facility-administered medications prior to visit.     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)    GERD (gastroesophageal reflux disease)    H/O coccidioidomycosis    pulmonary    HIV disease (HCC)    Human monkeypox    Past history of allergy to  penicillin -type antibiotic 07/03/2012   desensitization   Shigella gastroenteritis    Stroke [facility])    Syphilis    history /treated    Syphilis, unspecified 09/12/2016     Past Surgical History:  Procedure Laterality Date   BIOPSY  10/16/2020   Procedure: BIOPSY;  Surgeon: Wilhelmenia Aloha Raddle., MD;  Location: [facility] ENDOSCOPY;  Service: Gastroenterology;;   BIOPSY  10/23/2020   Procedure: BIOPSY;  Surgeon: Charlanne Groom, MD;   Location: [facility] ENDOSCOPY;  Service: Endoscopy;;   COLONOSCOPY WITH PROPOFOL  N/A 10/23/2020   Procedure: COLONOSCOPY WITH PROPOFOL ;  Surgeon: Charlanne Groom, MD;  Location: [facility] ENDOSCOPY;  Service: Endoscopy;  Laterality: N/A;   CYSTOSCOPY W/ URETERAL STENT PLACEMENT Bilateral 04/07/2024   Procedure: CYSTOSCOPY, WITH RETROGRADE PYELOGRAM AND URETERAL STENT INSERTION;  Surgeon: Twylla Glendia BROCKS, MD;  Location: [facility] OR;  Service: Urology;  Laterality: Bilateral;   ESOPHAGOGASTRODUODENOSCOPY (EGD) WITH PROPOFOL  N/A 10/16/2020   Procedure: ESOPHAGOGASTRODUODENOSCOPY (EGD) WITH PROPOFOL ;  Surgeon: Wilhelmenia Aloha Raddle., MD;  Location: [facility] ENDOSCOPY;  Service: Gastroenterology;  Laterality: N/A;   IR  NEPHROURETERAL CATH PLACE LEFT  11/10/2023   IR URETERAL STENT PLACEMENT EXISTING ACCESS RIGHT  12/28/2023   TRANSURETHRAL RESECTION OF PROSTATE N/A 12/09/2017   Procedure: TRANSURETHRAL RESECTION OF THE PROSTATE (TURP);  Surgeon: Cam Morene ORN, MD;  Location: [facility] ORS;  Service: Urology;  Laterality: N/A;   TRANSURETHRAL RESECTION OF PROSTATE N/A 11/07/2023   Procedure: TURP (TRANSURETHRAL RESECTION OF PROSTATE);  Surgeon: Watt Rush, MD;  Location: [facility] ORS;  Service: Urology;  Laterality: N/A;  TRANSURETHRAL RESECTION OF PROSTATIC ABSCESS WITHCYSTO AND  BILATERAL RETROGRADES        Review of Systems  Constitutional:  Negative for appetite change, chills, fatigue, fever and unexpected weight change.  Eyes:  Negative for visual disturbance.  Respiratory:  Negative for cough, chest tightness, shortness of breath and wheezing.   Cardiovascular:  Negative for chest pain and leg swelling.  Gastrointestinal:  Negative for abdominal pain, constipation, diarrhea, nausea and vomiting.  Genitourinary:  Negative for dysuria, flank pain, frequency, genital sores, hematuria and urgency.  Skin:  Negative for rash.  Allergic/Immunologic: Negative for immunocompromised state.  Neurological:  Negative for dizziness and  headaches.     Objective:   BP 133/76   Pulse 89   Temp 99.3 F (37.4 C) (Temporal)   Resp 16   Wt 190 lb 9.6 oz (86.5 kg)   SpO2 97%   BMI 25.15 kg/m  Nursing note and vital signs reviewed.  Physical Exam Constitutional:      General: He is not in acute distress.    Appearance: He is well-developed.  Eyes:     Conjunctiva/sclera: Conjunctivae normal.  Cardiovascular:     Rate and Rhythm: Normal rate and regular rhythm.     Heart sounds: Normal heart sounds. No murmur heard.    No friction rub. No gallop.  Pulmonary:     Effort: Pulmonary effort is normal. No respiratory distress.     Breath sounds: Normal breath sounds. No wheezing or rales.  Chest:     Chest wall: No tenderness.  Abdominal:     General: Bowel sounds are normal.     Palpations: Abdomen is soft.     Tenderness: There is no abdominal tenderness.  Musculoskeletal:     Cervical back: Neck supple.  Lymphadenopathy:     Cervical: No cervical adenopathy.  Skin:    General: Skin is warm and dry.     Findings: No rash.  Neurological:  Mental Status: He is alert and oriented to person, place, and time.  Psychiatric:        Mood and Affect: Mood normal.          04/25/2024    8:45 AM 11/30/2023    1:54 PM 09/11/2023    2:54 PM 10/18/2022   10:26 AM 01/20/2022    3:04 PM  Depression screen PHQ 2/9  Decreased Interest 0 0 0 0 0  Down, Depressed, Hopeless 0 0 0 0 0  PHQ - 2 Score 0 0 0 0 0  Altered sleeping 0 0     Tired, decreased energy 0 0     Change in appetite 0 0     Feeling bad or failure about yourself  0 0     Trouble concentrating 0 0     Moving slowly or fidgety/restless 0 0     Suicidal thoughts 0 0     PHQ-9 Score 0 0     Difficult doing work/chores Not difficult at all Not difficult at all           11/30/2023    1:55 PM 10/08/2020    1:53 PM  GAD 7 : Generalized Anxiety Score  Nervous, Anxious, on Edge 0 0  Control/stop worrying 0 0  Worry too much - different things 0 0   Trouble relaxing 0 0  Restless 0 0  Easily annoyed or irritable 0 0  Afraid - awful might happen 0 0  Total GAD 7 Score 0 0     The ASCVD Risk score (Arnett DK, et al., 2019) failed to calculate for the following reasons:   Risk score cannot be calculated because patient has a medical history suggesting prior/existing ASCVD      Assessment & Plan:    Patient Active Problem List   Diagnosis Date Noted   Complicated UTI (urinary tract infection) 04/06/2024   Homeless 12/01/2023   Screening for STDs (sexually transmitted diseases) 12/01/2023   HIV disease (HCC) 11/30/2023   Medication management 11/30/2023   Health care maintenance 11/30/2023   Prostatic abscess 11/07/2023   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 coli) (HCC) 03/04/2021   GERD (gastroesophageal reflux disease) 03/04/2021   Nonadherence to medical treatment    AKI (acute kidney injury) 02/10/2021   Loose stools    Coronavirus infection 08/25/2020   Healthcare maintenance 07/23/2020   Pancytopenia (HCC) 09/20/2018   Avoidance coping 09/07/2018   UTI due to extended-spectrum beta lactamase (ESBL) producing Escherichia coli 07/19/2018   Headache 04/27/2018   Condyloma 04/24/2018   Protein-calorie malnutrition, severe 12/12/2017   Personal history of MRSA (methicillin resistant Staphylococcus aureus) 12/09/2017   History of ESBL E. coli infection 12/09/2017   AIDS (acquired immune deficiency syndrome) (HCC)    Homelessness    Dysplasia of anus 01/02/2014   Tobacco use disorder 01/02/2014   Human immunodeficiency virus (HIV) disease (HCC) 01/01/2014      Problem List Items Addressed This Visit       Other   AIDS (acquired immune deficiency syndrome) (HCC)   [name] appears to be doing well with current dose  of Symtuza  despite being off medication for approximately 1 week.  Unclear as to inability to pick up medications as he did have refills available.  Discussed importance of taking medication daily as prescribed to reduce risk of disease progression and complications in the future.  His last lab work is probably the healthiest that he has been in a long time.  Social determinants of health reviewed with no interventions indicated.  Check blood work.  Continue current dose of Symtuza  with sample provided along with dapsone  for OI prophylaxis given continued low CD4 count.  Plan for follow-up in 3 months or sooner if needed.      Relevant Medications   Darunavir -Cobicistat-Emtricitabine -Tenofovir  Alafenamide (SYMTUZA ) 800-150-200-10 MG TABS   dapsone  100 MG tablet   Other Relevant Orders   Comprehensive metabolic panel with GFR   HIV-1 RNA quant-no reflex-bld   T-helper cell (CD4)- ([facility] clinic only)   Healthcare maintenance   Discussed importance of safe sexual practice and condom use. Condoms and site specific STD testing offered.  Vaccinations reviewed and declined following counseling.  Declines dental care referral. Due for anal pap smear for anal cancer screening and defers today.       Screening for STDs (sexually transmitted diseases) - Primary   Relevant Orders   RPR   Other Visit Diagnoses       Pharmacologic therapy       Relevant Orders   Lipid panel        I am having Fairy DOROTHA Ivonne Dixon maintain his omeprazole , CertaVite/Antioxidants, tamsulosin , Symtuza , and dapsone .   Meds ordered this encounter  Medications   Darunavir -Cobicistat-Emtricitabine -Tenofovir  Alafenamide (SYMTUZA ) 800-150-200-10 MG TABS    Sig: Take 1 tablet by mouth daily with breakfast.    Dispense:  30 tablet    Refill:  5     Supervising Provider:   LUIZ CHANNEL [phone]    Prescription Type::   Renewal   dapsone  100 MG tablet    Sig: Take 1 tablet (100 mg total) by mouth daily.    Dispense:  30 tablet    Refill:  5    Supervising Provider:   LUIZ CHANNEL [[id number]]     Follow-up: Return in about 3 months (around 07/26/2024). or sooner if needed.    Cathlyn July, MSN, FNP-C Nurse Practitioner [facility]ious Disease [facility] Main number: [phone]

## 2024-04-25 NOTE — Assessment & Plan Note (Signed)
 [name] appears to be doing well with current dose of Symtuza  despite being off medication for approximately 1 week.  Unclear as to inability to pick up medications as he did have refills available.  Discussed importance of taking medication daily as prescribed to reduce risk of disease progression and complications in the future.  His last lab work is probably the healthiest that he has been in a long time.  Social determinants of health reviewed with no interventions indicated.  Check blood work.  Continue current dose of Symtuza  with sample provided along with dapsone  for OI prophylaxis given continued low CD4 count.  Plan for follow-up in 3 months or sooner if needed.

## 2024-04-26 LAB — T-HELPER CELL (CD4) - (RCID CLINIC ONLY)
CD4 % Helper T Cell: 5 % — ABNORMAL LOW (ref 33–65)
CD4 T Cell Abs: 125 /uL — ABNORMAL LOW (ref 400–1790)

## 2024-04-29 LAB — COMPREHENSIVE METABOLIC PANEL WITH GFR
AG Ratio: 0.7 (calc) — ABNORMAL LOW (ref 1.0–2.5)
ALT: 10 U/L (ref 9–46)
AST: 14 U/L (ref 10–40)
Albumin: 3.4 g/dL — ABNORMAL LOW (ref 3.6–5.1)
Alkaline phosphatase (APISO): 69 U/L (ref 36–130)
BUN/Creatinine Ratio: 7 (calc) (ref 6–22)
BUN: 12 mg/dL (ref 7–25)
CO2: 24 mmol/L (ref 20–32)
Calcium: 8.1 mg/dL — ABNORMAL LOW (ref 8.6–10.3)
Chloride: 102 mmol/L (ref 98–110)
Creat: 1.77 mg/dL — ABNORMAL HIGH (ref 0.60–1.29)
Globulin: 4.8 g/dL — ABNORMAL HIGH (ref 1.9–3.7)
Glucose, Bld: 81 mg/dL (ref 65–99)
Potassium: 3.3 mmol/L — ABNORMAL LOW (ref 3.5–5.3)
Sodium: 132 mmol/L — ABNORMAL LOW (ref 135–146)
Total Bilirubin: 0.5 mg/dL (ref 0.2–1.2)
Total Protein: 8.2 g/dL — ABNORMAL HIGH (ref 6.1–8.1)
eGFR: 49 mL/min/1.73m2 — ABNORMAL LOW (ref >=60)

## 2024-04-29 LAB — LIPID PANEL
Cholesterol: 157 mg/dL (ref <200)
HDL: 56 mg/dL (ref >=40)
LDL Cholesterol (Calc): 88 mg/dL
Non-HDL Cholesterol (Calc): 101 mg/dL (ref <130)
Total CHOL/HDL Ratio: 2.8 (calc) (ref <5.0)
Triglycerides: 52 mg/dL (ref <150)

## 2024-04-29 LAB — T PALLIDUM AB: T Pallidum Abs: POSITIVE — AB

## 2024-04-29 LAB — HIV-1 RNA QUANT-NO REFLEX-BLD
HIV 1 RNA Quant: 153000 {copies}/mL — ABNORMAL HIGH
HIV-1 RNA Quant, Log: 5.18 {Log_copies}/mL — ABNORMAL HIGH

## 2024-04-29 LAB — RPR: RPR Ser Ql: REACTIVE — AB

## 2024-04-29 LAB — RPR TITER: RPR Titer: 1:8 {titer} — ABNORMAL HIGH

## 2024-04-30 ENCOUNTER — Ambulatory Visit: Payer: Self-pay | Admitting: Family

## 2024-08-14 ENCOUNTER — Ambulatory Visit: Admitting: Family

## 2024-08-15 ENCOUNTER — Emergency Department (HOSPITAL_COMMUNITY)

## 2024-08-15 ENCOUNTER — Emergency Department (HOSPITAL_COMMUNITY): Admission: EM | Admit: 2024-08-15 | Discharge: 2024-08-16 | Disposition: A | Discharge status: Home / Self Care

## 2024-08-15 ENCOUNTER — Other Ambulatory Visit: Payer: Self-pay

## 2024-08-15 DIAGNOSIS — R509 Fever, unspecified: Secondary | ICD-10-CM | POA: Insufficient documentation

## 2024-08-15 DIAGNOSIS — Z21 Asymptomatic human immunodeficiency virus [HIV] infection status: Secondary | ICD-10-CM | POA: Insufficient documentation

## 2024-08-15 DIAGNOSIS — D649 Anemia, unspecified: Secondary | ICD-10-CM | POA: Insufficient documentation

## 2024-08-15 DIAGNOSIS — R300 Dysuria: Secondary | ICD-10-CM | POA: Diagnosis present

## 2024-08-15 DIAGNOSIS — R10A0 Flank pain, unspecified side: Secondary | ICD-10-CM | POA: Insufficient documentation

## 2024-08-15 DIAGNOSIS — N12 Tubulo-interstitial nephritis, not specified as acute or chronic: Secondary | ICD-10-CM

## 2024-08-15 LAB — URINALYSIS, W/ REFLEX TO CULTURE (INFECTION SUSPECTED)
Bilirubin Urine: NEGATIVE
Glucose, UA: NEGATIVE mg/dL
Ketones, ur: NEGATIVE mg/dL
Nitrite: POSITIVE — AB
Protein, ur: 100 mg/dL — AB
RBC / HPF: >50 RBC/hpf (ref 0–5)
Specific Gravity, Urine: 1.008 (ref 1.005–1.030)
WBC, UA: >50 WBC/hpf (ref 0–5)
pH: 8 (ref 5.0–8.0)

## 2024-08-15 LAB — CBC WITH DIFFERENTIAL/PLATELET
Abs Immature Granulocytes: 0.13 K/uL — ABNORMAL HIGH (ref 0.00–0.07)
Basophils Absolute: 0 K/uL (ref 0.0–0.1)
Basophils Relative: 1 %
Eosinophils Absolute: 0.1 K/uL (ref 0.0–0.5)
Eosinophils Relative: 1 %
HCT: 33.8 % — ABNORMAL LOW (ref 39.0–52.0)
Hemoglobin: 10.7 g/dL — ABNORMAL LOW (ref 13.0–17.0)
Immature Granulocytes: 2 %
Lymphocytes Relative: 28 %
Lymphs Abs: 1.7 K/uL (ref 0.7–4.0)
MCH: 28.8 pg (ref 26.0–34.0)
MCHC: 31.7 g/dL (ref 30.0–36.0)
MCV: 90.9 fL (ref 80.0–100.0)
Monocytes Absolute: 0.9 K/uL (ref 0.1–1.0)
Monocytes Relative: 14 %
Neutro Abs: 3.4 K/uL (ref 1.7–7.7)
Neutrophils Relative %: 54 %
Platelets: 252 K/uL (ref 150–400)
RBC: 3.72 MIL/uL — ABNORMAL LOW (ref 4.22–5.81)
RDW: 15.6 % — ABNORMAL HIGH (ref 11.5–15.5)
WBC: 6.3 K/uL (ref 4.0–10.5)
nRBC: 0 % (ref 0.0–0.2)

## 2024-08-15 LAB — COMPREHENSIVE METABOLIC PANEL WITH GFR
ALT: 11 U/L (ref 0–44)
AST: 21 U/L (ref 15–41)
Albumin: 3.2 g/dL — ABNORMAL LOW (ref 3.5–5.0)
Alkaline Phosphatase: 68 U/L (ref 38–126)
Anion gap: 10 (ref 5–15)
BUN: 14 mg/dL (ref 6–20)
CO2: 22 mmol/L (ref 22–32)
Calcium: 8.7 mg/dL — ABNORMAL LOW (ref 8.9–10.3)
Chloride: 101 mmol/L (ref 98–111)
Creatinine, Ser: 1.45 mg/dL — ABNORMAL HIGH (ref 0.61–1.24)
GFR, Estimated: >60 mL/min
Glucose, Bld: 82 mg/dL (ref 70–99)
Potassium: 3.9 mmol/L (ref 3.5–5.1)
Sodium: 132 mmol/L — ABNORMAL LOW (ref 135–145)
Total Bilirubin: 0.6 mg/dL (ref 0.0–1.2)
Total Protein: 10 g/dL — ABNORMAL HIGH (ref 6.5–8.1)

## 2024-08-15 MED ORDER — OXYCODONE HCL 5 MG PO TABS
5.0000 mg | ORAL_TABLET | Freq: Once | ORAL | Status: AC
  Administered 2024-08-15: 5 mg via ORAL
  Filled 2024-08-15: qty 1

## 2024-08-15 NOTE — ED Provider Triage Note (Signed)
 Emergency Medicine Provider Triage Evaluation Note  Jacob Gutierrez , a 39 y.o. male  was evaluated in triage.  Pt complains of dysuria and bilateral flank pain.  Reports that he does not have a history of kidney stones, however reports that he has a stent in his left kidney.  Review of Systems  Positive: Per above Negative: Per above  Physical Exam  BP 110/78 (BP Location: Left Arm)   Pulse 89   Temp 97.6 F (36.4 C) (Oral)   Resp 19   Ht 6' 1 (1.854 m)   Wt 90.7 kg   SpO2 100%   BMI 26.39 kg/m  Gen:   Awake, no distress   Resp:  Normal effort  MSK:   Moves extremities without difficulty  Other:  No CVA tenderness bilaterally  Medical Decision Making  Medically screening exam initiated at 5:18 PM.  Appropriate orders placed.  Jacob Gutierrez was informed that the remainder of the evaluation will be completed by another provider, this initial triage assessment does not replace that evaluation, and the importance of remaining in the ED until their evaluation is complete.   Rogelia Jerilynn RAMAN, MD 08/15/24 [phone]

## 2024-08-15 NOTE — ED Triage Notes (Signed)
 Pt BIB [facility] from home d/t feeling like he is fever & lower back pain the last few days. Does have Hx of kidney issues, painful when he urinates it is cloudy & has odor, A/Ox4.  97.6  112/90 100 bpm  16 resp 96 RA  102 CBG

## 2024-08-16 ENCOUNTER — Other Ambulatory Visit (HOSPITAL_COMMUNITY): Payer: Self-pay

## 2024-08-16 MED ORDER — ONDANSETRON 4 MG PO TBDP
4.0000 mg | ORAL_TABLET | Freq: Three times a day (TID) | ORAL | 0 refills | Status: AC | PRN
  Filled 2024-08-16: qty 9, 3d supply, fill #0

## 2024-08-16 MED ORDER — CIPROFLOXACIN HCL 500 MG PO TABS
500.0000 mg | ORAL_TABLET | Freq: Two times a day (BID) | ORAL | 0 refills | Status: AC
  Filled 2024-08-16: qty 14, 7d supply, fill #0

## 2024-08-16 NOTE — ED Provider Notes (Addendum)
 " [place] [facility] Provider Note   CSN: [id number] Arrival date & time: 08/15/24  1616     Patient presents with: Flank Pain   Jacob Gutierrez is a 39 y.o. male.   This is a 39 year old male presenting emergency department with flank pain and dysuria.  Has a history of kidney stones and prior stenting.  Subjective fevers at home.  No nausea no vomiting.  Does note dysuria, no hematuria.  No abdominal pain.  Having normal bowel movements.   Flank Pain       Prior to Admission medications  Medication Sig Start Date End Date Taking? Authorizing Provider  dapsone  100 MG tablet Take 1 tablet (100 mg total) by mouth daily. 04/25/24   Calone, Gregory D, FNP  Darunavir -Cobicistat-Emtricitabine -Tenofovir  Alafenamide (SYMTUZA ) 800-150-200-10 MG TABS Take 1 tablet by mouth daily with breakfast. 04/25/24   Calone, Gregory D, FNP  Multiple Vitamins-Minerals (CERTAVITE/ANTIOXIDANTS) TABS Take 1 tablet by mouth daily. Patient not taking: Reported on 04/25/2024    [provider]  omeprazole  (PRILOSEC) 40 MG capsule Take 40 mg by mouth daily.    [provider]    Allergies: Vancomycin  and Amoxicillin    Review of Systems  Genitourinary:  Positive for flank pain.    Updated Vital Signs BP 124/83 (BP Location: Left Arm)   Pulse 93   Temp 99.4 F (37.4 C) (Oral)   Resp 18   Ht 6' 1 (1.854 m)   Wt 90.7 kg   SpO2 100%   BMI 26.39 kg/m   Physical Exam Vitals and nursing note reviewed.  Constitutional:      General: He is not in acute distress.    Appearance: He is not toxic-appearing.  HENT:     Head: Normocephalic.     Nose: Nose normal.     Mouth/Throat:     Mouth: Mucous membranes are moist.  Eyes:     Conjunctiva/sclera: Conjunctivae normal.  Cardiovascular:     Rate and Rhythm: Normal rate and regular rhythm.  Pulmonary:     Effort: Pulmonary effort is normal.  Abdominal:     General: Abdomen is flat. There is no  distension.     Tenderness: There is no abdominal tenderness. There is no right CVA tenderness, left CVA tenderness, guarding or rebound.  Musculoskeletal:        General: Normal range of motion.  Skin:    General: Skin is warm.     Capillary Refill: Capillary refill takes less than 2 seconds.  Neurological:     Mental Status: He is alert and oriented to person, place, and time.  Psychiatric:        Mood and Affect: Mood normal.        Behavior: Behavior normal.     (all labs ordered are listed, but only abnormal results are displayed) Labs Reviewed  CBC WITH DIFFERENTIAL/PLATELET - Abnormal; Notable for the following components:      Result Value   RBC 3.72 (*)    Hemoglobin 10.7 (*)    HCT 33.8 (*)    RDW 15.6 (*)    Abs Immature Granulocytes 0.13 (*)    All other components within normal limits  COMPREHENSIVE METABOLIC PANEL WITH GFR - Abnormal; Notable for the following components:   Sodium 132 (*)    Creatinine, Ser 1.45 (*)    Calcium  8.7 (*)    Total Protein 10.0 (*)    Albumin  3.2 (*)    All  other components within normal limits  URINALYSIS, W/ REFLEX TO CULTURE (INFECTION SUSPECTED) - Abnormal; Notable for the following components:   APPearance TURBID (*)    Hgb urine dipstick LARGE (*)    Protein, ur 100 (*)    Nitrite POSITIVE (*)    Leukocytes,Ua LARGE (*)    Bacteria, UA FEW (*)    All other components within normal limits  URINE CULTURE    EKG: None  Radiology: CT Renal Stone Study Result Date: 08/15/2024 CLINICAL DATA:  Flank pain EXAM: CT ABDOMEN AND PELVIS WITHOUT CONTRAST TECHNIQUE: Multidetector CT imaging of the abdomen and pelvis was performed following the standard protocol without IV contrast. RADIATION DOSE REDUCTION: This exam was performed according to the departmental dose-optimization program which includes automated exposure control, adjustment of the mA and/or kV according to patient size and/or use of iterative reconstruction technique.  COMPARISON:  CT 04/06/2024, 07/24/2023 FINDINGS: Lower chest: Lung bases demonstrate no acute airspace disease. Hepatobiliary: No focal liver abnormality is seen. No gallstones, gallbladder wall thickening, or biliary dilatation. Pancreas: Unremarkable. No pancreatic ductal dilatation or surrounding inflammatory changes. Spleen: Appears enlarged, measuring 15 cm AP. Adrenals/Urinary Tract: Adrenal glands are normal. Bilateral ureteral stents are noted, left proximal pigtail is in the left renal pelvis and the distal pigtail is in the bladder. Right ureteral stent within mid posterior calyx, distal pigtail in the posterior bladder. Since the most recent prior, interval decrease in hydronephrosis. There is mild residual bilateral hydronephrosis. Despite non contrasted exam, persistent severe urothelial thickening of the renal pelvises and ureters. The ureters are dilated. Thick-walled urinary bladder Stomach/Bowel: Stomach within normal limits. No dilated small bowel. No acute bowel wall thickening Vascular/Lymphatic: Nonaneurysmal aorta. Multiple enlarged retroperitoneal lymph nodes as before. Enlarged left pelvic and external iliac nodes also stable. Reproductive: Fluid distension of prostate urethra as before. Other: No ascites or free air Musculoskeletal: No acute or suspicious osseous abnormality IMPRESSION: 1. Bilateral ureteral stents remain in place with decreased bilateral hydronephrosis compared with most recent prior. There is mild residual bilateral hydronephrosis. Persistent severe urothelial thickening and inflammatory changes of the renal pelvises and ureters. Thick-walled urinary bladder as before. 2. Retroperitoneal and left pelvic adenopathy without significant change 3. Splenomegaly. Electronically Signed   By: Luke Bun M.D.   On: 08/15/2024 18:57     Procedures   Medications Ordered in the ED  oxyCODONE  (Oxy IR/ROXICODONE ) immediate release tablet 5 mg (5 mg Oral Given 08/15/24 1739)                                     Medical Decision Making This is a 39 year old male complex past medical history to include HIV, prior syphilis, stroke, status post renal stenting.  He is afebrile nontachycardic, normotensive.  Clinically well-appearing benign abdomen with no tenderness.  History concerning for possible stone versus infection.  CT scan without evidence of stone but did have some pulm amatory changes for infectious process.  UA also with evidence of urinary tract infection.  He has no fever tachycardia no leukocytosis to suggest sepsis/systemic infection.  Stable anemia.  His creatinine appears to be improved from prior values.  I did review his chart and, does have documented housing instability.  I considered admission, however he notes that he has secured current housing and has the means to afford antibiotics currently.  Therefore, feel he is appropriate for trial of outpatient antibiotics and follow-up with urology.  Given strict return precautions.  Discharged in stable condition.  Amount and/or Complexity of Data Reviewed External Data Reviewed:     Details: It appears he has had Klebsiella UTI in the past.  Seemed to be sensitive to ciprofloxacin .  Will discharge on same. Labs:     Details: See above Radiology:     Details: CT scan reviewed.  No free air.  Risk Decision regarding hospitalization. Diagnosis or treatment significantly limited by social determinants of health.       Final diagnoses:  None    ED Discharge Orders     None          Neysa Caron PARAS, DO 08/16/24 0750    Neysa Caron PARAS, DO 08/16/24 [phone]  "

## 2024-08-16 NOTE — Discharge Instructions (Signed)
 We have prescribed you antibiotics.  Please take them as prescribed for the full course even if symptoms improve.  You may take Tylenol  alternating with ibuprofen  for pain.  We have prescribed you nausea medications to take as needed for nausea and vomiting.  Please return for fevers, chills, severe pain, inability to urinate, uncontrolled nausea vomiting, lightheadedness, passout, seizures, chest pain, shortness of breath or any new or worsening symptoms that are concerning to you.  Please follow-up with your primary doctor and your urologist as soon as possible.

## 2024-08-18 ENCOUNTER — Telehealth (HOSPITAL_COMMUNITY): Payer: Self-pay

## 2024-08-20 ENCOUNTER — Telehealth: Payer: Self-pay

## 2024-08-20 NOTE — Telephone Encounter (Signed)
 Detectable Viral Load Intervention (DVL)  Most recent VL:  HIV 1 RNA Quant  Date Value Ref Range Status  04/25/2024 153,000 (H) NOT DETECTED copies/mL Final  11/30/2023 351 (H) copies/mL Final  09/11/2023 7,360 (H) Copies/mL Final    Last Clinic Visit: 10/2  Current ART regimen: Symtuza   Appointment status: patient does not have future appointment scheduled   Medication last dispensed (per chart review):   Medication Adherence  Not able to assess    Barriers to Care   Not able to assess   Interventions   Called patient to discuss medication adherence and possible barriers to care.

## 2024-08-21 LAB — URINE CULTURE: Culture: 80000 — AB

## 2024-08-22 ENCOUNTER — Telehealth (HOSPITAL_BASED_OUTPATIENT_CLINIC_OR_DEPARTMENT_OTHER): Payer: Self-pay | Admitting: *Deleted

## 2024-08-22 NOTE — Telephone Encounter (Signed)
 Post ED Visit - Positive Culture Follow-up: Unsuccessful Patient Follow-up  Culture assessed and recommendations reviewed by:  [x]  Leonor Bash, Pharm.D. []  Venetia Gully, [address].D., BCPS AQ-ID []  Garrel Crews, Pharm.D., BCPS []  Almarie Lunger, Pharm.D., BCPS []  [place], [place].D., BCPS, AAHIVP []  Rosaline Bihari, Pharm.D., BCPS, AAHIVP []  Massie Rigg, PharmD []  Jodie Rower, PharmD, BCPS  Positive urine culture  []  Patient discharged without antimicrobial prescription and treatment is now indicated [x]  Organism is resistant to prescribed ED discharge antimicrobial []  Patient with positive blood cultures  Plan: stop cipro ; start augmentin 875 mg Q12h x 5 days  Unable to contact patient after 3 attempts, letter will be sent to address on file  Lorita Barnie Pereyra 08/22/2024, 11:29 AM

## 2024-08-25 ENCOUNTER — Encounter (HOSPITAL_COMMUNITY): Payer: Self-pay

## 2024-08-25 ENCOUNTER — Other Ambulatory Visit (HOSPITAL_COMMUNITY): Payer: Self-pay

## 2024-08-25 ENCOUNTER — Emergency Department (HOSPITAL_COMMUNITY)

## 2024-08-25 ENCOUNTER — Emergency Department (HOSPITAL_COMMUNITY)
Admission: EM | Admit: 2024-08-25 | Discharge: 2024-08-25 | Disposition: A | Discharge status: Home / Self Care | Attending: Emergency Medicine | Admitting: Emergency Medicine

## 2024-08-25 ENCOUNTER — Other Ambulatory Visit: Payer: Self-pay

## 2024-08-25 DIAGNOSIS — L0390 Cellulitis, unspecified: Secondary | ICD-10-CM

## 2024-08-25 DIAGNOSIS — L03211 Cellulitis of face: Secondary | ICD-10-CM | POA: Diagnosis present

## 2024-08-25 LAB — CBC WITH DIFFERENTIAL/PLATELET
Abs Immature Granulocytes: 0.11 10*3/uL — ABNORMAL HIGH (ref 0.00–0.07)
Basophils Absolute: 0 10*3/uL (ref 0.0–0.1)
Basophils Relative: 0 %
Eosinophils Absolute: 0.2 10*3/uL (ref 0.0–0.5)
Eosinophils Relative: 3 %
HCT: 30.6 % — ABNORMAL LOW (ref 39.0–52.0)
Hemoglobin: 9.6 g/dL — ABNORMAL LOW (ref 13.0–17.0)
Immature Granulocytes: 2 %
Lymphocytes Relative: 24 %
Lymphs Abs: 1.6 10*3/uL (ref 0.7–4.0)
MCH: 29.3 pg (ref 26.0–34.0)
MCHC: 31.4 g/dL (ref 30.0–36.0)
MCV: 93.3 fL (ref 80.0–100.0)
Monocytes Absolute: 0.7 10*3/uL (ref 0.1–1.0)
Monocytes Relative: 11 %
Neutro Abs: 4.1 10*3/uL (ref 1.7–7.7)
Neutrophils Relative %: 60 %
Platelets: 276 10*3/uL (ref 150–400)
RBC: 3.28 MIL/uL — ABNORMAL LOW (ref 4.22–5.81)
RDW: 16.7 % — ABNORMAL HIGH (ref 11.5–15.5)
WBC: 6.7 10*3/uL (ref 4.0–10.5)
nRBC: 0 % (ref 0.0–0.2)

## 2024-08-25 LAB — BASIC METABOLIC PANEL WITH GFR
Anion gap: 9 (ref 5–15)
BUN: 17 mg/dL (ref 6–20)
CO2: 20 mmol/L — ABNORMAL LOW (ref 22–32)
Calcium: 8.4 mg/dL — ABNORMAL LOW (ref 8.9–10.3)
Chloride: 107 mmol/L (ref 98–111)
Creatinine, Ser: 1.74 mg/dL — ABNORMAL HIGH (ref 0.61–1.24)
GFR, Estimated: 50 mL/min — ABNORMAL LOW
Glucose, Bld: 79 mg/dL (ref 70–99)
Potassium: 3.7 mmol/L (ref 3.5–5.1)
Sodium: 136 mmol/L (ref 135–145)

## 2024-08-25 MED ORDER — CEPHALEXIN 500 MG PO CAPS: 500.0000 mg | ORAL_CAPSULE | Freq: Three times a day (TID) | ORAL | 0 refills | Status: AC

## 2024-08-25 MED ORDER — IOHEXOL 300 MG/ML  SOLN
75.0000 mL | Freq: Once | INTRAMUSCULAR | Status: AC | PRN
  Administered 2024-08-25: 75 mL via INTRAVENOUS

## 2024-08-25 MED ORDER — CEPHALEXIN 500 MG PO CAPS
500.0000 mg | ORAL_CAPSULE | Freq: Three times a day (TID) | ORAL | 0 refills | Status: DC
  Filled 2024-08-25: qty 15, 5d supply, fill #0

## 2024-08-25 MED ORDER — SODIUM CHLORIDE 0.9 % IV SOLN
2.0000 g | Freq: Once | INTRAVENOUS | Status: AC
  Administered 2024-08-25: 2 g via INTRAVENOUS
  Filled 2024-08-25: qty 20

## 2024-08-25 NOTE — ED Provider Notes (Signed)
 " [facility] AT [facility] Provider Note   CSN: [id number] Arrival date & time: 08/25/24  0256     History Chief Complaint  Patient presents with   Facial Swelling    HPI Jacob Gutierrez is a 39 y.o. male presenting for chief complaint of facial swelling.  39 year old male.  States that he noticed it spontaneously coming up over the last 48 hours.  Tried to express purulence from a pimple yesterday but swelling rapidly expanded following their attempt.  Did get some mild purulent expression from that attempt. Denies fevers chills nausea vomiting shortness of breath.  Able to open his right eye though endorses difficulty..   Patient's recorded medical, surgical, social, medication list and allergies were reviewed in the Snapshot window as part of the initial history.   Review of Systems   Review of Systems  Constitutional:  Negative for chills and fever.  HENT:  Negative for ear pain and sore throat.   Eyes:  Negative for pain and visual disturbance.  Respiratory:  Negative for cough and shortness of breath.   Cardiovascular:  Negative for chest pain and palpitations.  Gastrointestinal:  Negative for abdominal pain and vomiting.  Genitourinary:  Negative for dysuria and hematuria.  Musculoskeletal:  Negative for arthralgias and back pain.  Skin:  Negative for color change and rash.  Neurological:  Positive for facial asymmetry. Negative for seizures and syncope.  All other systems reviewed and are negative.   Physical Exam Updated Vital Signs BP 119/76 (BP Location: Right Arm)   Pulse 99   Temp 98.4 F (36.9 C) (Oral)   Resp 16   Ht 6' 1 (1.854 m)   Wt 90.7 kg   SpO2 96%   BMI 26.38 kg/m  Physical Exam Vitals and nursing note reviewed.  Constitutional:      General: He is not in acute distress.    Appearance: He is well-developed.  HENT:     Head: Normocephalic and atraumatic.  Eyes:     Conjunctiva/sclera: Conjunctivae normal.   Cardiovascular:     Rate and Rhythm: Normal rate and regular rhythm.     Heart sounds: No murmur heard. Pulmonary:     Effort: Pulmonary effort is normal. No respiratory distress.     Breath sounds: Normal breath sounds.  Abdominal:     Palpations: Abdomen is soft.     Tenderness: There is no abdominal tenderness.  Musculoskeletal:        General: Deformity (Substantial swelling of his midface on the right side.  Able to open eye.  All ocular motions intact.  Very tender to palpation along his right maxilla.) present. No swelling.     Cervical back: Neck supple.  Skin:    General: Skin is warm and dry.     Capillary Refill: Capillary refill takes less than 2 seconds.  Neurological:     Mental Status: He is alert.  Psychiatric:        Mood and Affect: Mood normal.      ED Course/ Medical Decision Making/ A&P    Procedures Procedures   Medications Ordered in ED Medications  cefTRIAXone  (ROCEPHIN ) 2 g in sodium chloride  0.9 % 100 mL IVPB (2 g Intravenous New Bag/Given 08/25/24 0518)  iohexol  (OMNIPAQUE ) 300 MG/ML solution 75 mL (75 mLs Intravenous Contrast Given 08/25/24 0421)    Medical Decision Making:   Jacob Gutierrez is a 39 y.o. male who presented to the ED today with right-sided facial swelling  detailed above.    Handoff received from EMS.  Patient placed on continuous vitals and telemetry monitoring while in ED which was reviewed periodically.  Complete initial physical exam performed, notably the patient  was hemodynamically stable no acute distress.    Reviewed and confirmed nursing documentation for past medical history, family history, social history.    Initial Assessment:   With the patient's presentation of right-sided facial swelling, most likely diagnosis is cellulitis versus abscess. Other diagnoses were considered including (but not limited to) orbital cellulitis/retrobulbar infection. These are considered less likely due to history of present illness and  physical exam findings.   This is most consistent with an acute life/limb threatening illness complicated by underlying chronic conditions.  Initial Plan:  CT midface with contrast Screening labs including CBC and Metabolic panel to evaluate for infectious or metabolic etiology of disease.  Objective evaluation as below reviewed   Initial Study Results:   Laboratory  All laboratory results reviewed without evidence of clinically relevant pathology.     Radiology:  All images reviewed independently. Agree with radiology report at this time.   CT Maxillofacial W Contrast Result Date: 08/25/2024 EXAM: CT Facial Bones with contrast 08/25/2024 04:32:43 AM TECHNIQUE: CT of the facial bones was performed with the administration of 75 mL of iohexol  (OMNIPAQUE ) 300 MG/ML solution. Multiplanar reformatted images are provided for review. Automated exposure control, iterative reconstruction, and/or weight based adjustment of the mA/kV was utilized to reduce the radiation dose to as low as reasonably achievable. COMPARISON: CT of the head dated 02/09/2021. CLINICAL HISTORY: large swelling concern for infection Large swelling; concern for infection. FINDINGS: AERODIGESTIVE TRACT: No mass. No edema. SALIVARY GLANDS: No acute abnormality. LYMPH NODES: There are mildly prominent cervical lymph nodes present bilaterally, likely reactive. SOFT TISSUES: There is extensive right facial soft tissue swelling now present, particularly overlying the right maxilla. The soft tissue swelling extends from the periorbital region to the submandibular region. There is thickening and stranding of the platysma musculature. There is no localized fluid collection to suggest an abscess. BRAIN, ORBITS AND SINUSES: There is no evidence of orbital cellulitis. There is mild polypoid mucosal disease within the floors of the maxillary sinuses. There is also mild mucosal disease within the ethmoid air cells and within the floor of the right  frontal sinus. BONES: No acute abnormality. No suspicious bone lesion. There is no evidence of osteomyelitis. IMPRESSION: 1. Extensive right facial soft tissue swelling, particularly overlying the right maxilla, with thickening and stranding of the platysma musculature, most consistent with cellulitis/phlegmon. No localized fluid collection to suggest an abscess. No CT evidence of osteomyelitis or orbital cellulitis. 2. Mild polypoid mucosal disease within the floors of the maxillary sinuses, ethmoid air cells, and floor of the right frontal sinus, which may reflect acute on chronic sinusitis; correlate with symptoms and consider ENT follow-up if persistent or recurrent. Electronically signed by: Evalene Coho MD 08/25/2024 05:05 AM EST RP Workstation: [id number]   CT Renal Stone Study Result Date: 08/15/2024 CLINICAL DATA:  Flank pain EXAM: CT ABDOMEN AND PELVIS WITHOUT CONTRAST TECHNIQUE: Multidetector CT imaging of the abdomen and pelvis was performed following the standard protocol without IV contrast. RADIATION DOSE REDUCTION: This exam was performed according to the departmental dose-optimization program which includes automated exposure control, adjustment of the mA and/or kV according to patient size and/or use of iterative reconstruction technique. COMPARISON:  CT 04/06/2024, 07/24/2023 FINDINGS: Lower chest: Lung bases demonstrate no acute airspace disease. Hepatobiliary: No focal liver abnormality is  seen. No gallstones, gallbladder wall thickening, or biliary dilatation. Pancreas: Unremarkable. No pancreatic ductal dilatation or surrounding inflammatory changes. Spleen: Appears enlarged, measuring 15 cm AP. Adrenals/Urinary Tract: Adrenal glands are normal. Bilateral ureteral stents are noted, left proximal pigtail is in the left renal pelvis and the distal pigtail is in the bladder. Right ureteral stent within mid posterior calyx, distal pigtail in the posterior bladder. Since the most recent  prior, interval decrease in hydronephrosis. There is mild residual bilateral hydronephrosis. Despite non contrasted exam, persistent severe urothelial thickening of the renal pelvises and ureters. The ureters are dilated. Thick-walled urinary bladder Stomach/Bowel: Stomach within normal limits. No dilated small bowel. No acute bowel wall thickening Vascular/Lymphatic: Nonaneurysmal aorta. Multiple enlarged retroperitoneal lymph nodes as before. Enlarged left pelvic and external iliac nodes also stable. Reproductive: Fluid distension of prostate urethra as before. Other: No ascites or free air Musculoskeletal: No acute or suspicious osseous abnormality IMPRESSION: 1. Bilateral ureteral stents remain in place with decreased bilateral hydronephrosis compared with most recent prior. There is mild residual bilateral hydronephrosis. Persistent severe urothelial thickening and inflammatory changes of the renal pelvises and ureters. Thick-walled urinary bladder as before. 2. Retroperitoneal and left pelvic adenopathy without significant change 3. Splenomegaly. Electronically Signed   By: Luke Bun M.D.   On: 08/15/2024 18:57     Reassessment and Plan:   Patient has relatively impressive cellulitis of the right side of his face after picking at a lesion. He has substantial swelling but no retrobulbar infection or any other acute emergent pathology.  Certainly no evidence of Ludwig's angina at this time. Will treat aggressively though given its high risk location.  Will treat with IV Rocephin .  Transition to oral tomorrow night and have patient follow-up with PCP on Monday.  Patient is in agreement with this plan strict return precautions reinforced and patient's EXPRESSED understanding.   Disposition:  I have considered need for hospitalization, however, considering all of the above, I believe this patient is stable for discharge at this time.  Patient/family educated about specific return precautions for  given chief complaint and symptoms.  Patient/family educated about follow-up with PCP .     Patient/family expressed understanding of return precautions and need for follow-up. Patient spoken to regarding all imaging and laboratory results and appropriate follow up for these results. All education provided in verbal form with additional information in written form. Time was allowed for answering of patient questions. Patient discharged.    Emergency Department Medication Summary:   Medications  cefTRIAXone  (ROCEPHIN ) 2 g in sodium chloride  0.9 % 100 mL IVPB (2 g Intravenous New Bag/Given 08/25/24 0518)  iohexol  (OMNIPAQUE ) 300 MG/ML solution 75 mL (75 mLs Intravenous Contrast Given 08/25/24 0421)         Clinical Impression:  1. Cellulitis, unspecified cellulitis site      Discharge   Final Clinical Impression(s) / ED Diagnoses Final diagnoses:  Cellulitis, unspecified cellulitis site    Rx / DC Orders ED Discharge Orders          Ordered    cephALEXin  (KEFLEX ) 500 MG capsule  3 times daily        08/25/24 0532              Jerral Meth, MD 08/25/24 0532  "

## 2024-08-25 NOTE — ED Triage Notes (Signed)
 Pt BIB [facility] from home for facial swelling. Pt stated he thinks it was bug bite, sister squeezed purulent drainage out of it yesterday. Warm and cold compresses attempted last night. Reports going to sleep and waking up with more swelling.   Bp 138/88 HR 70 RR 16 99 RA
